# Patient Record
Sex: Female | Born: 1960 | Race: Black or African American | Hispanic: No | Smoking: Current every day smoker
Health system: Southern US, Community
[De-identification: ages and names within clinical notes are randomized; demographics above are authoritative.]

## PROBLEM LIST (undated history)

## (undated) DIAGNOSIS — E119 Type 2 diabetes mellitus without complications: Secondary | ICD-10-CM

## (undated) DIAGNOSIS — T50901A Poisoning by unspecified drugs, medicaments and biological substances, accidental (unintentional), initial encounter: Secondary | ICD-10-CM

## (undated) DIAGNOSIS — R0602 Shortness of breath: Secondary | ICD-10-CM

## (undated) DIAGNOSIS — R569 Unspecified convulsions: Secondary | ICD-10-CM

## (undated) DIAGNOSIS — F4310 Post-traumatic stress disorder, unspecified: Secondary | ICD-10-CM

## (undated) DIAGNOSIS — T8859XA Other complications of anesthesia, initial encounter: Secondary | ICD-10-CM

## (undated) DIAGNOSIS — T4145XA Adverse effect of unspecified anesthetic, initial encounter: Secondary | ICD-10-CM

## (undated) DIAGNOSIS — F32A Depression, unspecified: Secondary | ICD-10-CM

## (undated) DIAGNOSIS — J45909 Unspecified asthma, uncomplicated: Secondary | ICD-10-CM

## (undated) DIAGNOSIS — N189 Chronic kidney disease, unspecified: Secondary | ICD-10-CM

## (undated) DIAGNOSIS — F329 Major depressive disorder, single episode, unspecified: Secondary | ICD-10-CM

## (undated) DIAGNOSIS — K219 Gastro-esophageal reflux disease without esophagitis: Secondary | ICD-10-CM

## (undated) DIAGNOSIS — Z9289 Personal history of other medical treatment: Secondary | ICD-10-CM

## (undated) DIAGNOSIS — D631 Anemia in chronic kidney disease: Secondary | ICD-10-CM

## (undated) DIAGNOSIS — F99 Mental disorder, not otherwise specified: Secondary | ICD-10-CM

## (undated) DIAGNOSIS — F17200 Nicotine dependence, unspecified, uncomplicated: Secondary | ICD-10-CM

## (undated) DIAGNOSIS — R011 Cardiac murmur, unspecified: Secondary | ICD-10-CM

## (undated) DIAGNOSIS — J449 Chronic obstructive pulmonary disease, unspecified: Secondary | ICD-10-CM

## (undated) DIAGNOSIS — F319 Bipolar disorder, unspecified: Secondary | ICD-10-CM

## (undated) DIAGNOSIS — I10 Essential (primary) hypertension: Secondary | ICD-10-CM

## (undated) HISTORY — PX: OTHER SURGICAL HISTORY: SHX169

## (undated) HISTORY — PX: PARATHYROIDECTOMY: SHX19

## (undated) HISTORY — PX: JOINT REPLACEMENT: SHX530

---

## 2011-05-20 DIAGNOSIS — R87619 Unspecified abnormal cytological findings in specimens from cervix uteri: Secondary | ICD-10-CM | POA: Diagnosis not present

## 2011-05-20 DIAGNOSIS — Z113 Encounter for screening for infections with a predominantly sexual mode of transmission: Secondary | ICD-10-CM | POA: Diagnosis not present

## 2011-05-20 DIAGNOSIS — Z0000 Encounter for general adult medical examination without abnormal findings: Secondary | ICD-10-CM | POA: Diagnosis not present

## 2011-05-20 DIAGNOSIS — R87811 Vaginal high risk human papillomavirus (HPV) DNA test positive: Secondary | ICD-10-CM | POA: Diagnosis not present

## 2011-05-20 DIAGNOSIS — Z01419 Encounter for gynecological examination (general) (routine) without abnormal findings: Secondary | ICD-10-CM | POA: Diagnosis not present

## 2011-05-20 DIAGNOSIS — N949 Unspecified condition associated with female genital organs and menstrual cycle: Secondary | ICD-10-CM | POA: Diagnosis not present

## 2011-05-25 DIAGNOSIS — F17200 Nicotine dependence, unspecified, uncomplicated: Secondary | ICD-10-CM | POA: Diagnosis not present

## 2011-05-25 DIAGNOSIS — R51 Headache: Secondary | ICD-10-CM | POA: Diagnosis not present

## 2011-05-25 DIAGNOSIS — J3489 Other specified disorders of nose and nasal sinuses: Secondary | ICD-10-CM | POA: Diagnosis not present

## 2011-05-25 DIAGNOSIS — I10 Essential (primary) hypertension: Secondary | ICD-10-CM | POA: Diagnosis not present

## 2011-07-05 ENCOUNTER — Ambulatory Visit: Payer: Medicare Other

## 2011-07-27 DIAGNOSIS — I10 Essential (primary) hypertension: Secondary | ICD-10-CM | POA: Diagnosis not present

## 2011-07-27 DIAGNOSIS — E119 Type 2 diabetes mellitus without complications: Secondary | ICD-10-CM | POA: Diagnosis not present

## 2011-08-17 DIAGNOSIS — I10 Essential (primary) hypertension: Secondary | ICD-10-CM | POA: Diagnosis not present

## 2011-08-17 DIAGNOSIS — E785 Hyperlipidemia, unspecified: Secondary | ICD-10-CM | POA: Diagnosis not present

## 2011-08-17 DIAGNOSIS — F17200 Nicotine dependence, unspecified, uncomplicated: Secondary | ICD-10-CM | POA: Diagnosis not present

## 2011-08-29 ENCOUNTER — Encounter (HOSPITAL_COMMUNITY): Payer: Self-pay | Admitting: Emergency Medicine

## 2011-08-29 ENCOUNTER — Emergency Department (HOSPITAL_COMMUNITY)
Admission: EM | Admit: 2011-08-29 | Discharge: 2011-08-29 | Disposition: A | Discharge status: Home / Self Care | Payer: Medicare Other | Attending: Emergency Medicine | Admitting: Emergency Medicine

## 2011-08-29 DIAGNOSIS — F319 Bipolar disorder, unspecified: Secondary | ICD-10-CM | POA: Diagnosis not present

## 2011-08-29 DIAGNOSIS — N289 Disorder of kidney and ureter, unspecified: Secondary | ICD-10-CM | POA: Diagnosis not present

## 2011-08-29 DIAGNOSIS — Z79899 Other long term (current) drug therapy: Secondary | ICD-10-CM | POA: Diagnosis not present

## 2011-08-29 DIAGNOSIS — R4182 Altered mental status, unspecified: Secondary | ICD-10-CM | POA: Insufficient documentation

## 2011-08-29 DIAGNOSIS — F209 Schizophrenia, unspecified: Secondary | ICD-10-CM | POA: Diagnosis not present

## 2011-08-29 DIAGNOSIS — R45851 Suicidal ideations: Secondary | ICD-10-CM | POA: Diagnosis not present

## 2011-08-29 HISTORY — DX: Mental disorder, not otherwise specified: F99

## 2011-08-29 LAB — URINALYSIS, ROUTINE W REFLEX MICROSCOPIC
Bilirubin Urine: NEGATIVE
Glucose, UA: NEGATIVE mg/dL
Hgb urine dipstick: NEGATIVE
Ketones, ur: NEGATIVE mg/dL
Leukocytes, UA: NEGATIVE
Nitrite: NEGATIVE
Protein, ur: NEGATIVE mg/dL
Specific Gravity, Urine: 1.006 (ref 1.005–1.030)
Urobilinogen, UA: 0.2 mg/dL (ref 0.0–1.0)
pH: 6.5 (ref 5.0–8.0)

## 2011-08-29 LAB — COMPREHENSIVE METABOLIC PANEL
ALT: 15 U/L (ref 0–35)
AST: 19 U/L (ref 0–37)
Albumin: 3.8 g/dL (ref 3.5–5.2)
Alkaline Phosphatase: 72 U/L (ref 39–117)
BUN: 22 mg/dL (ref 6–23)
CO2: 25 mEq/L (ref 19–32)
Calcium: 11.4 mg/dL — ABNORMAL HIGH (ref 8.4–10.5)
Chloride: 103 mEq/L (ref 96–112)
Creatinine, Ser: 2.22 mg/dL — ABNORMAL HIGH (ref 0.50–1.10)
GFR calc Af Amer: 28 mL/min — ABNORMAL LOW (ref >90)
GFR calc non Af Amer: 24 mL/min — ABNORMAL LOW (ref >90)
Glucose, Bld: 73 mg/dL (ref 70–99)
Potassium: 5 mEq/L (ref 3.5–5.1)
Sodium: 138 mEq/L (ref 135–145)
Total Bilirubin: 0.2 mg/dL — ABNORMAL LOW (ref 0.3–1.2)
Total Protein: 8.5 g/dL — ABNORMAL HIGH (ref 6.0–8.3)

## 2011-08-29 LAB — RAPID URINE DRUG SCREEN, HOSP PERFORMED
Amphetamines: NOT DETECTED
Barbiturates: NOT DETECTED
Benzodiazepines: NOT DETECTED
Cocaine: NOT DETECTED
Opiates: NOT DETECTED
Tetrahydrocannabinol: NOT DETECTED

## 2011-08-29 LAB — CBC
HCT: 36.1 % (ref 36.0–46.0)
Hemoglobin: 11.6 g/dL — ABNORMAL LOW (ref 12.0–15.0)
MCH: 31.2 pg (ref 26.0–34.0)
MCHC: 32.1 g/dL (ref 30.0–36.0)
MCV: 97 fL (ref 78.0–100.0)
Platelets: 270 10*3/uL (ref 150–400)
RBC: 3.72 MIL/uL — ABNORMAL LOW (ref 3.87–5.11)
RDW: 15.4 % (ref 11.5–15.5)
WBC: 9 10*3/uL (ref 4.0–10.5)

## 2011-08-29 LAB — VALPROIC ACID LEVEL: Valproic Acid Lvl: <10 ug/mL — ABNORMAL LOW (ref 50.0–100.0)

## 2011-08-29 LAB — ETHANOL: Alcohol, Ethyl (B): <11 mg/dL (ref 0–11)

## 2011-08-29 LAB — ACETAMINOPHEN LEVEL: Acetaminophen (Tylenol), Serum: <15 ug/mL (ref 10–30)

## 2011-08-29 MED ORDER — OLANZAPINE 10 MG PO TBDP: 10.0000 mg | ORAL_TABLET | Freq: Every day | ORAL | Status: DC

## 2011-08-29 MED ORDER — OLANZAPINE 10 MG PO TBDP
10.0000 mg | ORAL_TABLET | Freq: Once | ORAL | Status: AC
  Administered 2011-08-29: 10 mg via ORAL
  Filled 2011-08-29: qty 1

## 2011-08-29 MED ORDER — LORAZEPAM 1 MG PO TABS
1.0000 mg | ORAL_TABLET | Freq: Once | ORAL | Status: AC
  Administered 2011-08-29: 1 mg via ORAL
  Filled 2011-08-29: qty 1

## 2011-08-29 NOTE — ED Notes (Signed)
[employer] at bedside 

## 2011-08-29 NOTE — ED Notes (Signed)
To ed via [facility] with c/o altered mental status--- [employer] with patient. Not in custody. Patient wanting to leave,

## 2011-08-29 NOTE — ED Notes (Signed)
[id number]> Expected date:<BR> Expected time:<BR> Means of arrival:<BR> Comments:<BR> Ems/ off bipolar meds

## 2011-08-29 NOTE — Discharge Instructions (Signed)
Please followup for behavioral health as instructed by team. Jennifer Lawson been given referral to the nephrologist to be reassessed for your kidney disease.
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
## 2011-08-29 NOTE — ED Notes (Signed)
Family at bedside. 
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
## 2011-08-29 NOTE — ED Notes (Signed)
Pt with constant chewing motions during assessment. Pt refuses to answer questions when asked stating" i want to get out of here".

## 2011-08-30 ENCOUNTER — Encounter (HOSPITAL_COMMUNITY): Payer: Self-pay

## 2011-08-30 ENCOUNTER — Emergency Department (HOSPITAL_COMMUNITY)
Admission: EM | Admit: 2011-08-30 | Discharge: 2011-08-31 | Disposition: A | Discharge status: Home / Self Care | Payer: Medicare Other | Attending: Emergency Medicine | Admitting: Emergency Medicine

## 2011-08-30 DIAGNOSIS — R45851 Suicidal ideations: Secondary | ICD-10-CM | POA: Insufficient documentation

## 2011-08-30 DIAGNOSIS — F209 Schizophrenia, unspecified: Secondary | ICD-10-CM | POA: Diagnosis not present

## 2011-08-30 DIAGNOSIS — Z79899 Other long term (current) drug therapy: Secondary | ICD-10-CM | POA: Diagnosis not present

## 2011-08-30 HISTORY — DX: Depression, unspecified: F32.A

## 2011-08-30 HISTORY — DX: Major depressive disorder, single episode, unspecified: F32.9

## 2011-08-30 LAB — COMPREHENSIVE METABOLIC PANEL
ALT: 15 U/L (ref 0–35)
AST: 18 U/L (ref 0–37)
Albumin: 3.6 g/dL (ref 3.5–5.2)
Alkaline Phosphatase: 70 U/L (ref 39–117)
BUN: 32 mg/dL — ABNORMAL HIGH (ref 6–23)
CO2: 25 mEq/L (ref 19–32)
Calcium: 10.4 mg/dL (ref 8.4–10.5)
Chloride: 100 mEq/L (ref 96–112)
Creatinine, Ser: 2.61 mg/dL — ABNORMAL HIGH (ref 0.50–1.10)
GFR calc Af Amer: 23 mL/min — ABNORMAL LOW (ref >90)
GFR calc non Af Amer: 20 mL/min — ABNORMAL LOW (ref >90)
Glucose, Bld: 84 mg/dL (ref 70–99)
Potassium: 4.7 mEq/L (ref 3.5–5.1)
Sodium: 135 mEq/L (ref 135–145)
Total Bilirubin: 0.1 mg/dL — ABNORMAL LOW (ref 0.3–1.2)
Total Protein: 8.1 g/dL (ref 6.0–8.3)

## 2011-08-30 LAB — CBC
HCT: 35.1 % — ABNORMAL LOW (ref 36.0–46.0)
Hemoglobin: 11.3 g/dL — ABNORMAL LOW (ref 12.0–15.0)
MCH: 31.3 pg (ref 26.0–34.0)
MCHC: 32.2 g/dL (ref 30.0–36.0)
MCV: 97.2 fL (ref 78.0–100.0)
Platelets: 234 10*3/uL (ref 150–400)
RBC: 3.61 MIL/uL — ABNORMAL LOW (ref 3.87–5.11)
RDW: 15.3 % (ref 11.5–15.5)
WBC: 8.5 10*3/uL (ref 4.0–10.5)

## 2011-08-30 LAB — ETHANOL: Alcohol, Ethyl (B): <11 mg/dL (ref 0–11)

## 2011-08-30 MED ORDER — NICOTINE 21 MG/24HR TD PT24
21.0000 mg | MEDICATED_PATCH | Freq: Every day | TRANSDERMAL | Status: DC
  Administered 2011-08-30 – 2011-08-31 (×2): 21 mg via TRANSDERMAL
  Filled 2011-08-30 (×2): qty 1

## 2011-08-30 MED ORDER — METOPROLOL SUCCINATE ER 50 MG PO TB24
50.0000 mg | ORAL_TABLET | Freq: Every day | ORAL | Status: DC
  Administered 2011-08-31: 50 mg via ORAL
  Filled 2011-08-30: qty 1

## 2011-08-30 MED ORDER — DIVALPROEX SODIUM 250 MG PO DR TAB
250.0000 mg | DELAYED_RELEASE_TABLET | Freq: Every day | ORAL | Status: DC
  Administered 2011-08-31: 250 mg via ORAL
  Filled 2011-08-30: qty 1

## 2011-08-30 MED ORDER — ACETAMINOPHEN 325 MG PO TABS: 650.0000 mg | ORAL_TABLET | ORAL | Status: DC | PRN

## 2011-08-30 MED ORDER — QUETIAPINE FUMARATE 100 MG PO TABS: 100.0000 mg | ORAL_TABLET | Freq: Every day | ORAL | Status: DC

## 2011-08-30 MED ORDER — FUROSEMIDE 20 MG PO TABS
20.0000 mg | ORAL_TABLET | Freq: Two times a day (BID) | ORAL | Status: DC
  Administered 2011-08-31 (×2): 20 mg via ORAL
  Filled 2011-08-30 (×3): qty 1

## 2011-08-30 MED ORDER — ALUM & MAG HYDROXIDE-SIMETH 200-200-20 MG/5ML PO SUSP: 30.0000 mL | ORAL | Status: DC | PRN

## 2011-08-30 MED ORDER — ONDANSETRON HCL 4 MG PO TABS: 4.0000 mg | ORAL_TABLET | Freq: Three times a day (TID) | ORAL | Status: DC | PRN

## 2011-08-30 MED ORDER — IBUPROFEN 600 MG PO TABS: 600.0000 mg | ORAL_TABLET | Freq: Three times a day (TID) | ORAL | Status: DC | PRN

## 2011-08-30 MED ORDER — ZOLPIDEM TARTRATE 5 MG PO TABS
5.0000 mg | ORAL_TABLET | Freq: Every evening | ORAL | Status: DC | PRN
  Administered 2011-08-30: 5 mg via ORAL
  Filled 2011-08-30: qty 1

## 2011-08-30 MED ORDER — AMLODIPINE BESYLATE 10 MG PO TABS
10.0000 mg | ORAL_TABLET | Freq: Every day | ORAL | Status: DC
  Administered 2011-08-31: 10 mg via ORAL
  Filled 2011-08-30: qty 1

## 2011-08-30 NOTE — ED Provider Notes (Signed)
Medical screening examination/treatment/procedure(s) were performed by non-physician practitioner and as supervising physician I was immediately available for consultation/collaboration.   Godwin Tedesco, MD 08/30/11 2323 

## 2011-08-30 NOTE — ED Notes (Signed)
Patient ate regular diet and drank soft drink with out difficulty

## 2011-08-30 NOTE — ED Notes (Signed)
Pt in with family member with s/i states per family pt is expressing thoughts of harming herself, with paranoid activity pt admits to s/i without a plan

## 2011-08-30 NOTE — ED Provider Notes (Signed)
History     CSN: [id number]  Arrival date & time 08/30/11  1714   First MD Initiated Contact with Patient 08/30/11 1718      Chief Complaint  Patient presents with  . V70.1    suicidal     (Consider location/radiation/quality/duration/timing/severity/associated sxs/prior treatment) HPI  Pt brought in by her daughter for SI. The patient states that she wants to die. She was here yesterday and had her IVC papers rescinded and was given information for outpatient treatment. The daughter has a baby < 34 years old at home and the pt keeps asking to hold the baby but the daughter says she gets very aggressive and smoked two packs of cigarettes back to back. The daughter is worried about her son and her safety. She states that she can not live with her mother until she gets therapeutic on her medications. The patients affect is very odd. The only words she will say to me is that "I want to die. I ain't got nobody".  Past Medical History  Diagnosis Date  . Mental disorder     History reviewed. No pertinent past surgical history.  History reviewed. No pertinent family history.  History  Substance Use Topics  . Smoking status: Current Everyday Smoker  . Smokeless tobacco: Not on file  . Alcohol Use: Yes    OB History    Grav Para Term Preterm Abortions TAB SAB Ect Mult Living                  Review of Systems  Unable to perform ROS   Allergies  Codeine sulfate  Home Medications   Current Outpatient Rx  Name Route Sig Dispense Refill  . AMLODIPINE BESYLATE 10 MG PO TABS Oral Take 10 mg by mouth daily.    Marland Kitchen DIVALPROEX SODIUM 250 MG PO TBEC Oral Take 250 mg by mouth daily.    Marland Kitchen ESZOPICLONE 1 MG PO TABS Oral Take 3 mg by mouth at bedtime. Take immediately before bedtime    . FUROSEMIDE 20 MG PO TABS Oral Take 20 mg by mouth 2 (two) times daily.    Marland Kitchen METOPROLOL SUCCINATE ER 50 MG PO TB24 Oral Take 50 mg by mouth daily. Take with or immediately following a meal.    .  QUETIAPINE FUMARATE 100 MG PO TABS Oral Take 100 mg by mouth at bedtime.      BP 120/84  Pulse 88  Temp(Src) 98.5 F (36.9 C) (Oral)  Resp 16  SpO2 100%  Physical Exam  Constitutional: She appears well-developed and well-nourished.       Pt disheveled and does not answer questions appropriately   HENT:  Head: Normocephalic and atraumatic.  Eyes: Pupils are equal, round, and reactive to light.  Pulmonary/Chest: Effort normal.  Abdominal: Soft.  Neurological: She is alert.  Skin: Skin is warm and dry.    ED Course  Procedures (including critical care time)   Labs Reviewed  CBC  COMPREHENSIVE METABOLIC PANEL  ETHANOL  URINE RAPID DRUG SCREEN (HOSP PERFORMED)   No results found.   1. Suicidal ideations       MDM  ACT to consult on patient        Dorthula Matas, [place] 08/30/11 2313

## 2011-08-31 DIAGNOSIS — R45851 Suicidal ideations: Secondary | ICD-10-CM | POA: Diagnosis not present

## 2011-08-31 DIAGNOSIS — F209 Schizophrenia, unspecified: Secondary | ICD-10-CM | POA: Diagnosis not present

## 2011-08-31 MED ORDER — ZOLPIDEM TARTRATE 5 MG PO TABS: 5.0000 mg | ORAL_TABLET | Freq: Every evening | ORAL | Status: DC | PRN

## 2011-08-31 MED ORDER — LORAZEPAM 2 MG/ML IJ SOLN
2.0000 mg | Freq: Once | INTRAMUSCULAR | Status: AC
  Administered 2011-08-31: 2 mg via INTRAMUSCULAR
  Filled 2011-08-31: qty 1

## 2011-08-31 MED ORDER — LORAZEPAM 2 MG/ML IJ SOLN
INTRAMUSCULAR | Status: AC
  Filled 2011-08-31: qty 1

## 2011-08-31 MED ORDER — LORAZEPAM 1 MG PO TABS
ORAL_TABLET | ORAL | Status: AC
  Filled 2011-08-31: qty 2

## 2011-08-31 NOTE — ED Provider Notes (Signed)
History     CSN: [id number]  Arrival date & time 08/30/11  1714   First MD Initiated Contact with Patient 08/30/11 1718      Chief Complaint  Patient presents with  . V70.1    suicidal     (Consider location/radiation/quality/duration/timing/severity/associated sxs/prior treatment) HPI...  complains of depression verbal days.  she has had suicidal ideation but not now. Has history of schizophrenia. Nothing makes symptoms better or worse. Described as moderate  Past Medical History  Diagnosis Date  . Mental disorder   . Depression     History reviewed. No pertinent past surgical history.  History reviewed. No pertinent family history.  History  Substance Use Topics  . Smoking status: Current Everyday Smoker  . Smokeless tobacco: Not on file  . Alcohol Use: Yes    OB History    Grav Para Term Preterm Abortions TAB SAB Ect Mult Living                  Review of Systems  All other systems reviewed and are negative.    Allergies  Codeine sulfate  Home Medications   Current Outpatient Rx  Name Route Sig Dispense Refill  . AMLODIPINE BESYLATE 10 MG PO TABS Oral Take 10 mg by mouth daily.    Marland Kitchen DIVALPROEX SODIUM 250 MG PO TBEC Oral Take 250 mg by mouth daily.    Marland Kitchen ESZOPICLONE 1 MG PO TABS Oral Take 3 mg by mouth at bedtime. Take immediately before bedtime    . FUROSEMIDE 20 MG PO TABS Oral Take 20 mg by mouth 2 (two) times daily.    Marland Kitchen METOPROLOL SUCCINATE ER 50 MG PO TB24 Oral Take 50 mg by mouth daily. Take with or immediately following a meal.    . QUETIAPINE FUMARATE 100 MG PO TABS Oral Take 100 mg by mouth at bedtime.      BP 123/80  Pulse 91  Temp(Src) 98.3 F (36.8 C) (Oral)  Resp 18  SpO2 99%  Physical Exam  Nursing note and vitals reviewed. Constitutional: She is oriented to person, place, and time. She appears well-developed and well-nourished.  HENT:  Head: Normocephalic and atraumatic.  Eyes: Conjunctivae and EOM are normal. Pupils are equal,  round, and reactive to light.  Neck: Normal range of motion. Neck supple.  Cardiovascular: Normal rate and regular rhythm.   Pulmonary/Chest: Effort normal and breath sounds normal.  Abdominal: Soft. Bowel sounds are normal.  Musculoskeletal: Normal range of motion.  Neurological: She is alert and oriented to person, place, and time.  Skin: Skin is warm and dry.  Psychiatric:       Flat affect depressed    ED Course  Procedures (including critical care time)  Labs Reviewed  CBC - Abnormal; Notable for the following:    RBC 3.61 (*)    Hemoglobin 11.3 (*)    HCT 35.1 (*)    All other components within normal limits  COMPREHENSIVE METABOLIC PANEL - Abnormal; Notable for the following:    BUN 32 (*)    Creatinine, Ser 2.61 (*)    Total Bilirubin 0.1 (*)    GFR calc non Af Amer 20 (*)    GFR calc Af Amer 23 (*)    All other components within normal limits  ETHANOL  URINE RAPID DRUG SCREEN (HOSP PERFORMED)   No results found.   1. Schizophrenia       MDM  Tele psychiatry consult obtained. No  suicidal ideation at this time.  We'll discharge        Donnetta Hutching, MD 09/22/11 2159

## 2011-08-31 NOTE — Discharge Instructions (Signed)
Followup with community resources as recommendedSchizophrenia Schizophrenia is a serious mental illness. There is disturbed and disorganized thinking, language, and behavior. Patients may see, hear, or feel things that are not really there. Sometimes speech is incoherent and there are multiple problems with day to day living. Schizophrenia should not be confused with multiple personality disorder (now called dissociative identity disorder) in which a person has at least two distinct personalities. About 1% of people have schizophrenia in their lifetimes. It affects men and women equally. CAUSES  There are many theories about the cause of schizophrenia. The genes a person inherits from his or her parents may be partially responsible. Stress in a person's environment can trigger episodes. A person may have functioned normally for years and then have an acute (sudden) psychotic episode caused by stress. Some scientists believe that something might happen before birth, such as a viral infection in the womb, that causes schizophrenic symptoms (problems) decades later. Special scans, such as PET (positron-emission tomography) have been used to look at the brains of people with this illness. These pictures show that some parts of the brain seem to have metabolic or chemical abnormalities. Lab studies have shown that nerve cells in some parts of the brains of schizophrenics may be uneven or damaged. Another possible cause is that chemicals carrying signals between nerve cells may not be working. Schizophrenia does not appear to be caused by family problems. Stress does appear to make things worse for people with this illness. SYMPTOMS No single symptom defines this condition. Important signs are:  Hallucinations (hearing voices or seeing things that are not there to hear or see).   Dressing inappropriately.   Neglecting personal hygiene and grooming.   Withdrawing from social contacts and not speaking to  anybody (autism).   Inability to understand what a schizophrenic is saying.   A growing distrust of people without good cause.   Being very bland or blunted emotionally (flat affect).  TYPES OF SCHIZOPHRENIA  Paranoid schizophrenia involves delusional thoughts. The patient believes people around them are against them and plotting against them.   In grandiose schizophrenia the patient may feel that he is God, or the President, etc.   Disorganized schizophrenia involves symptoms of disorganized speech.  TREATMENT  Medications are the most important part of the treatment of schizophrenia. Many medications are available that can relieve symptoms. It is often helpful if these can be administered by a more trusted family member because the patient may sometimes think they are being poisoned. It is important that the medications be given regularly even when the patient seems to be doing well. Do not stop giving medications without instruction by a caregiver. This could lead to a relapse. Hospitalization may sometimes be necessary if symptoms cannot be controlled with medications. Schizophrenia may be lifelong. However, periods of illness may be inter spaced with long periods of normality. Medications can greatly improve the quality of life. Non prescribed drugs and alcohol should be avoided.  Assistance is available for care. The [employer] is an organization of family members of people with severe mental illness. They direct families and patients to support groups, education, and advocacy programs for additional help. [employer] [facility] [employer] [employer]) toll-free help line number is 800/950-NAMI, or (779)455-6634. Document Released: 04/22/2000 Document Revised: 04/14/2011 Document Reviewed: 04/25/2005 [facility] Patient Information 2012 [place], [place].

## 2011-08-31 NOTE — BH Assessment (Signed)
Assessment Note   Jennifer Lawson is a 51 y.o. female who returns to [facility] with c/o SI, no plan.  Pt was discharged on 08/29/11 from ed after IVC recinded by er physician.  Pt recently moved from [place] to [place] due to inadequate mental health in [place] in hopes of receiving better care in [place].  Pt was d/c'd with referrals for therapists and psychiatrists.  Pt says she came back to ed due to having SI because her children made her feel bad today about smoking and telling that she doesn't do anything right.  Pt no longer endorses SI and wants to return home.  Telepsych will be requested to determine final disposition.    Axis I: Bipolar, Depressed Axis II: Deferred Axis III:  Past Medical History  Diagnosis Date  . Mental disorder   . Depression    Axis IV: other psychosocial or environmental problems and problems with primary support group Axis V: 41-50 serious symptoms  Past Medical History:  Past Medical History  Diagnosis Date  . Mental disorder   . Depression     History reviewed. No pertinent past surgical history.  Family History: History reviewed. No pertinent family history.  Social History:  reports that she has been smoking.  She does not have any smokeless tobacco history on file. She reports that she drinks alcohol. She reports that she does not use illicit drugs.  Additional Social History:  Alcohol / Drug Use Pain Medications: None  Prescriptions: None  Over the Counter: None  History of alcohol / drug use?: No history of alcohol / drug abuse Longest period of sobriety (when/how long): nONE  Allergies:  Allergies  Allergen Reactions  . Codeine Sulfate Anaphylaxis    Daughter called about having this allergy     Home Medications:  (Not in a hospital admission)  OB/GYN Status:  No LMP recorded. Patient is not currently having periods (Reason: Other).  General Assessment Data Location of Assessment: [facility] Living Arrangements: Other  (Comment) (Lives w/daughter, son-in-law and grandson ) Can pt return to current living arrangement?: Yes Admission Status: Voluntary Is patient capable of signing voluntary admission?: Yes Transfer from: Acute Hospital Referral Source: MD  Education Status Is patient currently in school?: No Current Grade: None  Highest grade of school patient has completed: None  Name of school: None  Contact person: None   Risk to self Suicidal Ideation: No-Not Currently/Within Last 6 Months Suicidal Intent: No-Not Currently/Within Last 6 Months Is patient at risk for suicide?: No Suicidal Plan?: No-Not Currently/Within Last 6 Months Access to Means: No What has been your use of drugs/alcohol within the last 12 months?: Pt denies  Previous Attempts/Gestures: No How many times?: 0  Other Self Harm Risks: None  Triggers for Past Attempts: None known Intentional Self Injurious Behavior: None Family Suicide History: No Recent stressful life event(s): Other (Comment) (Off Medications x1 wk and relational issues w/family ) Persecutory voices/beliefs?: No Depression: Yes Depression Symptoms: Loss of interest in usual pleasures Substance abuse history and/or treatment for substance abuse?: No Suicide prevention information given to non-admitted patients: Not applicable  Risk to Others Homicidal Ideation: No Thoughts of Harm to Others: No Current Homicidal Intent: No Current Homicidal Plan: No Access to Homicidal Means: No Identified Victim: None  History of harm to others?: No Assessment of Violence: None Noted Violent Behavior Description: None  Does patient have access to weapons?: No Criminal Charges Pending?: No Does patient have a court date: No  Psychosis  Hallucinations: None noted Delusions: None noted  Mental Status Report Appear/Hygiene: Disheveled Eye Contact: Good Motor Activity: Unremarkable Speech: Logical/coherent;Slow;Soft Level of Consciousness: Alert Mood:  Depressed Affect: Sad Anxiety Level: None Thought Processes: Coherent;Relevant Judgement: Unimpaired Orientation: Person;Place;Time;Situation Obsessive Compulsive Thoughts/Behaviors: None  Cognitive Functioning Concentration: Normal Memory: Recent Intact;Remote Intact IQ: Average Insight: Fair Impulse Control: Fair Appetite: Good Weight Loss: 0  Weight Gain: 0  Total Hours of Sleep: 3  (Since being d/c'd last night ) Vegetative Symptoms: None  Prior Inpatient Therapy Prior Inpatient Therapy: Yes Prior Therapy Dates: 1995-2008 Prior Therapy Facilty/Provider(s): [place], [place], [place]  Reason for Treatment: Bipolar D/O   Prior Outpatient Therapy Prior Outpatient Therapy: Yes Prior Therapy Dates: Current  Prior Therapy Facilty/Provider(s): Dr. Mikeal Hawthorne  Reason for Treatment: Med Mgt   ADL Screening (condition at time of admission) Patient's cognitive ability adequate to safely complete daily activities?: Yes Patient able to express need for assistance with ADLs?: Yes Independently performs ADLs?: Yes Weakness of Legs: None Weakness of Arms/Hands: None       Abuse/Neglect Assessment (Assessment to be complete while patient is alone) Physical Abuse: Denies Verbal Abuse: Denies Sexual Abuse: Denies Exploitation of patient/patient's resources: Denies Self-Neglect: Denies Values / Beliefs Cultural Requests During Hospitalization: None Spiritual Requests During Hospitalization: None Consults Spiritual Care Consult Needed: No Social Work Consult Needed: No Merchant navy officer (For Healthcare) Advance Directive: Patient does not have advance directive;Patient would not like information Pre-existing out of facility DNR order (yellow form or pink MOST form): No    Additional Information 1:1 In Past 12 Months?: No CIRT Risk: No Elopement Risk: No Does patient have medical clearance?: Yes     Disposition:  Disposition Disposition of Patient: Referred to  (Telepsych for final disposition ) Patient referred to: Other (Comment) (Telepsych )  On Site Evaluation by:   Reviewed with Physician:     Murrell Redden 08/31/2011 12:15 AM

## 2011-08-31 NOTE — ED Notes (Signed)
Pt discharged home with daughter. Denies SI/HI. No A/VH. Instructions reviewed. Verbalizes understanding.

## 2011-08-31 NOTE — ED Notes (Signed)
Pt had telepsych completed. Narrative by Dr. Debbora Presto states: "Jennifer Lawson was brought in by her family because of vague suicidal ideations. Presently she can contract for safety and regrets the statements that led to being brought tot he hospital. Jennifer Lawson is no evidence of delusional thought process or cognitive impairments. She denies any plan or intent to harm self or others. Patient has a history of demonstrating regressed coping skills. At this time the patient is cognitively intact and may be discharged." Will discuss with EDP for disposition.

## 2011-08-31 NOTE — ED Notes (Signed)
Spoke with daughter on phone- daughter states that she is scared of her mother, that she wakes up and finds her  Mother standing over her.   Is unable to pick her up until after 4pm.

## 2011-08-31 NOTE — ED Provider Notes (Signed)
Pt being released to family.  Requested a sleeping pill which seemed to help here while she was here in the ED.  Rx for ambien given  Celene Kras, MD 08/31/11 [phone]
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
## 2011-09-04 DIAGNOSIS — R45851 Suicidal ideations: Secondary | ICD-10-CM | POA: Diagnosis not present

## 2011-09-04 DIAGNOSIS — F322 Major depressive disorder, single episode, severe without psychotic features: Secondary | ICD-10-CM | POA: Diagnosis not present

## 2011-09-04 DIAGNOSIS — F319 Bipolar disorder, unspecified: Secondary | ICD-10-CM | POA: Diagnosis not present

## 2011-09-04 DIAGNOSIS — F489 Nonpsychotic mental disorder, unspecified: Secondary | ICD-10-CM | POA: Diagnosis not present

## 2011-09-05 DIAGNOSIS — I440 Atrioventricular block, first degree: Secondary | ICD-10-CM | POA: Diagnosis not present

## 2011-09-05 DIAGNOSIS — R6889 Other general symptoms and signs: Secondary | ICD-10-CM | POA: Diagnosis not present

## 2011-09-05 DIAGNOSIS — R45851 Suicidal ideations: Secondary | ICD-10-CM | POA: Diagnosis not present

## 2011-09-05 DIAGNOSIS — R609 Edema, unspecified: Secondary | ICD-10-CM | POA: Diagnosis not present

## 2011-09-05 DIAGNOSIS — IMO0002 Reserved for concepts with insufficient information to code with codable children: Secondary | ICD-10-CM | POA: Diagnosis not present

## 2011-09-05 DIAGNOSIS — Z5900 Homelessness unspecified: Secondary | ICD-10-CM | POA: Diagnosis not present

## 2011-09-05 DIAGNOSIS — N289 Disorder of kidney and ureter, unspecified: Secondary | ICD-10-CM | POA: Diagnosis present

## 2011-09-05 DIAGNOSIS — Z9119 Patient's noncompliance with other medical treatment and regimen: Secondary | ICD-10-CM | POA: Diagnosis not present

## 2011-09-05 DIAGNOSIS — N184 Chronic kidney disease, stage 4 (severe): Secondary | ICD-10-CM | POA: Diagnosis not present

## 2011-09-05 DIAGNOSIS — T1500XA Foreign body in cornea, unspecified eye, initial encounter: Secondary | ICD-10-CM | POA: Diagnosis not present

## 2011-09-05 DIAGNOSIS — F17200 Nicotine dependence, unspecified, uncomplicated: Secondary | ICD-10-CM | POA: Diagnosis present

## 2011-09-05 DIAGNOSIS — I129 Hypertensive chronic kidney disease with stage 1 through stage 4 chronic kidney disease, or unspecified chronic kidney disease: Secondary | ICD-10-CM | POA: Diagnosis present

## 2011-09-05 DIAGNOSIS — Z5989 Other problems related to housing and economic circumstances: Secondary | ICD-10-CM | POA: Diagnosis not present

## 2011-09-05 DIAGNOSIS — Z598 Other problems related to housing and economic circumstances: Secondary | ICD-10-CM | POA: Diagnosis not present

## 2011-09-05 DIAGNOSIS — F319 Bipolar disorder, unspecified: Secondary | ICD-10-CM | POA: Diagnosis not present

## 2011-09-05 DIAGNOSIS — I10 Essential (primary) hypertension: Secondary | ICD-10-CM | POA: Diagnosis not present

## 2011-09-05 DIAGNOSIS — N183 Chronic kidney disease, stage 3 unspecified: Secondary | ICD-10-CM | POA: Diagnosis not present

## 2011-09-05 DIAGNOSIS — F322 Major depressive disorder, single episode, severe without psychotic features: Secondary | ICD-10-CM | POA: Diagnosis not present

## 2011-09-05 DIAGNOSIS — F259 Schizoaffective disorder, unspecified: Secondary | ICD-10-CM | POA: Diagnosis not present

## 2011-10-04 DIAGNOSIS — E785 Hyperlipidemia, unspecified: Secondary | ICD-10-CM | POA: Diagnosis not present

## 2011-10-04 DIAGNOSIS — N19 Unspecified kidney failure: Secondary | ICD-10-CM | POA: Diagnosis not present

## 2011-10-04 DIAGNOSIS — K219 Gastro-esophageal reflux disease without esophagitis: Secondary | ICD-10-CM | POA: Diagnosis not present

## 2011-10-04 DIAGNOSIS — F17200 Nicotine dependence, unspecified, uncomplicated: Secondary | ICD-10-CM | POA: Diagnosis not present

## 2011-10-04 DIAGNOSIS — R0609 Other forms of dyspnea: Secondary | ICD-10-CM | POA: Diagnosis not present

## 2011-10-04 DIAGNOSIS — R5383 Other fatigue: Secondary | ICD-10-CM | POA: Diagnosis not present

## 2011-10-04 DIAGNOSIS — R0989 Other specified symptoms and signs involving the circulatory and respiratory systems: Secondary | ICD-10-CM | POA: Diagnosis not present

## 2011-10-04 DIAGNOSIS — I10 Essential (primary) hypertension: Secondary | ICD-10-CM | POA: Diagnosis not present

## 2011-10-04 DIAGNOSIS — J40 Bronchitis, not specified as acute or chronic: Secondary | ICD-10-CM | POA: Diagnosis not present

## 2011-10-04 DIAGNOSIS — E538 Deficiency of other specified B group vitamins: Secondary | ICD-10-CM | POA: Diagnosis not present

## 2011-10-04 DIAGNOSIS — I509 Heart failure, unspecified: Secondary | ICD-10-CM | POA: Diagnosis not present

## 2011-10-04 DIAGNOSIS — F319 Bipolar disorder, unspecified: Secondary | ICD-10-CM | POA: Diagnosis not present

## 2011-10-04 DIAGNOSIS — E559 Vitamin D deficiency, unspecified: Secondary | ICD-10-CM | POA: Diagnosis not present

## 2011-10-04 DIAGNOSIS — R5381 Other malaise: Secondary | ICD-10-CM | POA: Diagnosis not present

## 2011-10-12 DIAGNOSIS — F259 Schizoaffective disorder, unspecified: Secondary | ICD-10-CM | POA: Diagnosis not present

## 2011-10-18 DIAGNOSIS — R0989 Other specified symptoms and signs involving the circulatory and respiratory systems: Secondary | ICD-10-CM | POA: Diagnosis not present

## 2011-10-18 DIAGNOSIS — I509 Heart failure, unspecified: Secondary | ICD-10-CM | POA: Diagnosis not present

## 2011-10-18 DIAGNOSIS — I10 Essential (primary) hypertension: Secondary | ICD-10-CM | POA: Diagnosis not present

## 2011-10-18 DIAGNOSIS — R0609 Other forms of dyspnea: Secondary | ICD-10-CM | POA: Diagnosis not present

## 2011-10-18 DIAGNOSIS — F17200 Nicotine dependence, unspecified, uncomplicated: Secondary | ICD-10-CM | POA: Diagnosis not present

## 2011-10-18 DIAGNOSIS — F319 Bipolar disorder, unspecified: Secondary | ICD-10-CM | POA: Diagnosis not present

## 2011-10-18 DIAGNOSIS — E785 Hyperlipidemia, unspecified: Secondary | ICD-10-CM | POA: Diagnosis not present

## 2011-10-18 DIAGNOSIS — N19 Unspecified kidney failure: Secondary | ICD-10-CM | POA: Diagnosis not present

## 2011-11-16 DIAGNOSIS — F4310 Post-traumatic stress disorder, unspecified: Secondary | ICD-10-CM | POA: Diagnosis not present

## 2011-11-16 DIAGNOSIS — F3132 Bipolar disorder, current episode depressed, moderate: Secondary | ICD-10-CM | POA: Diagnosis not present

## 2011-12-07 DIAGNOSIS — R0989 Other specified symptoms and signs involving the circulatory and respiratory systems: Secondary | ICD-10-CM | POA: Diagnosis not present

## 2011-12-07 DIAGNOSIS — I509 Heart failure, unspecified: Secondary | ICD-10-CM | POA: Diagnosis not present

## 2011-12-07 DIAGNOSIS — R0609 Other forms of dyspnea: Secondary | ICD-10-CM | POA: Diagnosis not present

## 2011-12-07 DIAGNOSIS — F17200 Nicotine dependence, unspecified, uncomplicated: Secondary | ICD-10-CM | POA: Diagnosis not present

## 2011-12-20 DIAGNOSIS — I10 Essential (primary) hypertension: Secondary | ICD-10-CM | POA: Diagnosis not present

## 2011-12-20 DIAGNOSIS — F17200 Nicotine dependence, unspecified, uncomplicated: Secondary | ICD-10-CM | POA: Diagnosis not present

## 2011-12-20 DIAGNOSIS — F319 Bipolar disorder, unspecified: Secondary | ICD-10-CM | POA: Diagnosis not present

## 2011-12-20 DIAGNOSIS — I509 Heart failure, unspecified: Secondary | ICD-10-CM | POA: Diagnosis not present

## 2011-12-20 DIAGNOSIS — N19 Unspecified kidney failure: Secondary | ICD-10-CM | POA: Diagnosis not present

## 2011-12-20 DIAGNOSIS — N183 Chronic kidney disease, stage 3 unspecified: Secondary | ICD-10-CM | POA: Diagnosis not present

## 2011-12-20 DIAGNOSIS — E785 Hyperlipidemia, unspecified: Secondary | ICD-10-CM | POA: Diagnosis not present

## 2011-12-20 DIAGNOSIS — R0609 Other forms of dyspnea: Secondary | ICD-10-CM | POA: Diagnosis not present

## 2011-12-20 DIAGNOSIS — R0989 Other specified symptoms and signs involving the circulatory and respiratory systems: Secondary | ICD-10-CM | POA: Diagnosis not present

## 2011-12-20 DIAGNOSIS — J40 Bronchitis, not specified as acute or chronic: Secondary | ICD-10-CM | POA: Diagnosis not present

## 2012-01-05 DIAGNOSIS — F3132 Bipolar disorder, current episode depressed, moderate: Secondary | ICD-10-CM | POA: Diagnosis not present

## 2012-01-05 DIAGNOSIS — F4310 Post-traumatic stress disorder, unspecified: Secondary | ICD-10-CM | POA: Diagnosis not present

## 2012-01-20 DIAGNOSIS — F3132 Bipolar disorder, current episode depressed, moderate: Secondary | ICD-10-CM | POA: Diagnosis not present

## 2012-01-20 DIAGNOSIS — F4310 Post-traumatic stress disorder, unspecified: Secondary | ICD-10-CM | POA: Diagnosis not present

## 2012-01-26 DIAGNOSIS — I509 Heart failure, unspecified: Secondary | ICD-10-CM | POA: Diagnosis not present

## 2012-01-26 DIAGNOSIS — F17200 Nicotine dependence, unspecified, uncomplicated: Secondary | ICD-10-CM | POA: Diagnosis not present

## 2012-01-26 DIAGNOSIS — Z23 Encounter for immunization: Secondary | ICD-10-CM | POA: Diagnosis not present

## 2012-01-26 DIAGNOSIS — I10 Essential (primary) hypertension: Secondary | ICD-10-CM | POA: Diagnosis not present

## 2012-01-26 DIAGNOSIS — F319 Bipolar disorder, unspecified: Secondary | ICD-10-CM | POA: Diagnosis not present

## 2012-01-26 DIAGNOSIS — N183 Chronic kidney disease, stage 3 unspecified: Secondary | ICD-10-CM | POA: Diagnosis not present

## 2012-01-26 DIAGNOSIS — E538 Deficiency of other specified B group vitamins: Secondary | ICD-10-CM | POA: Diagnosis not present

## 2012-01-26 DIAGNOSIS — E785 Hyperlipidemia, unspecified: Secondary | ICD-10-CM | POA: Diagnosis not present

## 2012-02-03 DIAGNOSIS — F4310 Post-traumatic stress disorder, unspecified: Secondary | ICD-10-CM | POA: Diagnosis not present

## 2012-02-03 DIAGNOSIS — F3132 Bipolar disorder, current episode depressed, moderate: Secondary | ICD-10-CM | POA: Diagnosis not present

## 2012-02-16 DIAGNOSIS — F3132 Bipolar disorder, current episode depressed, moderate: Secondary | ICD-10-CM | POA: Diagnosis not present

## 2012-02-16 DIAGNOSIS — F4310 Post-traumatic stress disorder, unspecified: Secondary | ICD-10-CM | POA: Diagnosis not present

## 2012-02-20 ENCOUNTER — Encounter (HOSPITAL_COMMUNITY): Payer: Self-pay | Admitting: Emergency Medicine

## 2012-02-20 ENCOUNTER — Emergency Department (HOSPITAL_COMMUNITY): Payer: Medicare Other

## 2012-02-20 ENCOUNTER — Inpatient Hospital Stay (HOSPITAL_COMMUNITY)
Admission: EM | Admit: 2012-02-20 | Discharge: 2012-02-23 | DRG: 918 | Disposition: A | Discharge status: Home / Self Care | Payer: Medicare Other | Attending: Family Medicine | Admitting: Family Medicine

## 2012-02-20 DIAGNOSIS — Y92009 Unspecified place in unspecified non-institutional (private) residence as the place of occurrence of the external cause: Secondary | ICD-10-CM

## 2012-02-20 DIAGNOSIS — T43614A Poisoning by caffeine, undetermined, initial encounter: Principal | ICD-10-CM | POA: Diagnosis present

## 2012-02-20 DIAGNOSIS — I10 Essential (primary) hypertension: Secondary | ICD-10-CM

## 2012-02-20 DIAGNOSIS — S060X9A Concussion with loss of consciousness of unspecified duration, initial encounter: Secondary | ICD-10-CM | POA: Diagnosis not present

## 2012-02-20 DIAGNOSIS — R7301 Impaired fasting glucose: Secondary | ICD-10-CM | POA: Diagnosis not present

## 2012-02-20 DIAGNOSIS — N179 Acute kidney failure, unspecified: Secondary | ICD-10-CM | POA: Diagnosis present

## 2012-02-20 DIAGNOSIS — T148XXA Other injury of unspecified body region, initial encounter: Secondary | ICD-10-CM | POA: Diagnosis not present

## 2012-02-20 DIAGNOSIS — N189 Chronic kidney disease, unspecified: Secondary | ICD-10-CM

## 2012-02-20 DIAGNOSIS — T43601A Poisoning by unspecified psychostimulants, accidental (unintentional), initial encounter: Secondary | ICD-10-CM | POA: Diagnosis present

## 2012-02-20 DIAGNOSIS — I129 Hypertensive chronic kidney disease with stage 1 through stage 4 chronic kidney disease, or unspecified chronic kidney disease: Secondary | ICD-10-CM | POA: Diagnosis present

## 2012-02-20 DIAGNOSIS — E860 Dehydration: Secondary | ICD-10-CM

## 2012-02-20 DIAGNOSIS — N289 Disorder of kidney and ureter, unspecified: Secondary | ICD-10-CM

## 2012-02-20 DIAGNOSIS — T50901A Poisoning by unspecified drugs, medicaments and biological substances, accidental (unintentional), initial encounter: Secondary | ICD-10-CM | POA: Diagnosis not present

## 2012-02-20 DIAGNOSIS — F17200 Nicotine dependence, unspecified, uncomplicated: Secondary | ICD-10-CM | POA: Diagnosis present

## 2012-02-20 DIAGNOSIS — F319 Bipolar disorder, unspecified: Secondary | ICD-10-CM

## 2012-02-20 DIAGNOSIS — R000 Tachycardia, unspecified: Secondary | ICD-10-CM | POA: Diagnosis present

## 2012-02-20 DIAGNOSIS — R404 Transient alteration of awareness: Secondary | ICD-10-CM | POA: Diagnosis present

## 2012-02-20 DIAGNOSIS — S0993XA Unspecified injury of face, initial encounter: Secondary | ICD-10-CM | POA: Diagnosis not present

## 2012-02-20 DIAGNOSIS — E873 Alkalosis: Secondary | ICD-10-CM | POA: Diagnosis present

## 2012-02-20 DIAGNOSIS — I498 Other specified cardiac arrhythmias: Secondary | ICD-10-CM | POA: Diagnosis not present

## 2012-02-20 DIAGNOSIS — F3110 Bipolar disorder, current episode manic without psychotic features, unspecified: Secondary | ICD-10-CM | POA: Diagnosis present

## 2012-02-20 HISTORY — DX: Type 2 diabetes mellitus without complications: E11.9

## 2012-02-20 HISTORY — DX: Essential (primary) hypertension: I10

## 2012-02-20 LAB — COMPREHENSIVE METABOLIC PANEL
ALT: 45 U/L — ABNORMAL HIGH (ref 0–35)
AST: 28 U/L (ref 0–37)
Albumin: 4.1 g/dL (ref 3.5–5.2)
Alkaline Phosphatase: 98 U/L (ref 39–117)
BUN: 42 mg/dL — ABNORMAL HIGH (ref 6–23)
CO2: 23 mEq/L (ref 19–32)
Calcium: 11.9 mg/dL — ABNORMAL HIGH (ref 8.4–10.5)
Chloride: 100 mEq/L (ref 96–112)
Creatinine, Ser: 3.03 mg/dL — ABNORMAL HIGH (ref 0.50–1.10)
GFR calc Af Amer: 19 mL/min — ABNORMAL LOW (ref >90)
GFR calc non Af Amer: 17 mL/min — ABNORMAL LOW (ref >90)
Glucose, Bld: 78 mg/dL (ref 70–99)
Potassium: 5.3 mEq/L — ABNORMAL HIGH (ref 3.5–5.1)
Sodium: 138 mEq/L (ref 135–145)
Total Bilirubin: 0.2 mg/dL — ABNORMAL LOW (ref 0.3–1.2)
Total Protein: 8.7 g/dL — ABNORMAL HIGH (ref 6.0–8.3)

## 2012-02-20 LAB — CBC WITH DIFFERENTIAL/PLATELET
Basophils Absolute: 0 10*3/uL (ref 0.0–0.1)
Basophils Relative: 0 % (ref 0–1)
Eosinophils Absolute: 0.1 10*3/uL (ref 0.0–0.7)
Eosinophils Relative: 1 % (ref 0–5)
HCT: 39.1 % (ref 36.0–46.0)
Hemoglobin: 12.9 g/dL (ref 12.0–15.0)
Lymphocytes Relative: 12 % (ref 12–46)
Lymphs Abs: 1.1 10*3/uL (ref 0.7–4.0)
MCH: 30.2 pg (ref 26.0–34.0)
MCHC: 33 g/dL (ref 30.0–36.0)
MCV: 91.6 fL (ref 78.0–100.0)
Monocytes Absolute: 0.5 10*3/uL (ref 0.1–1.0)
Monocytes Relative: 6 % (ref 3–12)
Neutro Abs: 7.7 10*3/uL (ref 1.7–7.7)
Neutrophils Relative %: 82 % — ABNORMAL HIGH (ref 43–77)
Platelets: 345 10*3/uL (ref 150–400)
RBC: 4.27 MIL/uL (ref 3.87–5.11)
RDW: 14.1 % (ref 11.5–15.5)
WBC: 9.5 10*3/uL (ref 4.0–10.5)

## 2012-02-20 LAB — ACETAMINOPHEN LEVEL: Acetaminophen (Tylenol), Serum: <15 ug/mL (ref 10–30)

## 2012-02-20 LAB — URINALYSIS, ROUTINE W REFLEX MICROSCOPIC
Bilirubin Urine: NEGATIVE
Glucose, UA: NEGATIVE mg/dL
Hgb urine dipstick: NEGATIVE
Leukocytes, UA: NEGATIVE
Nitrite: NEGATIVE
Protein, ur: 100 mg/dL — AB
Specific Gravity, Urine: 1.009 (ref 1.005–1.030)
Urobilinogen, UA: 0.2 mg/dL (ref 0.0–1.0)
pH: 6 (ref 5.0–8.0)

## 2012-02-20 LAB — RAPID URINE DRUG SCREEN, HOSP PERFORMED
Amphetamines: NOT DETECTED
Barbiturates: NOT DETECTED
Benzodiazepines: NOT DETECTED
Cocaine: NOT DETECTED
Opiates: NOT DETECTED
Tetrahydrocannabinol: NOT DETECTED

## 2012-02-20 LAB — GLUCOSE, CAPILLARY: Glucose-Capillary: 144 mg/dL — ABNORMAL HIGH (ref 70–99)

## 2012-02-20 LAB — URINE MICROSCOPIC-ADD ON

## 2012-02-20 LAB — TROPONIN I: Troponin I: <0.3 ng/mL (ref <0.30)

## 2012-02-20 LAB — SALICYLATE LEVEL: Salicylate Lvl: <2 mg/dL — ABNORMAL LOW (ref 2.8–20.0)

## 2012-02-20 LAB — ETHANOL: Alcohol, Ethyl (B): <11 mg/dL (ref 0–11)

## 2012-02-20 LAB — POTASSIUM: Potassium: 4.6 mEq/L (ref 3.5–5.1)

## 2012-02-20 MED ORDER — SODIUM CHLORIDE 0.9 % IJ SOLN: 3.0000 mL | Freq: Two times a day (BID) | INTRAMUSCULAR | Status: DC

## 2012-02-20 MED ORDER — SODIUM CHLORIDE 0.9 % IV SOLN
INTRAVENOUS | Status: DC
  Administered 2012-02-21: 125 mL/h via INTRAVENOUS
  Administered 2012-02-21: 12:00:00 via INTRAVENOUS
  Administered 2012-02-21: 125 mL/h via INTRAVENOUS
  Administered 2012-02-22 – 2012-02-23 (×2): via INTRAVENOUS

## 2012-02-20 MED ORDER — ZIPRASIDONE MESYLATE 20 MG IM SOLR
INTRAMUSCULAR | Status: AC
  Administered 2012-02-20: 20 mg via INTRAMUSCULAR
  Filled 2012-02-20: qty 20

## 2012-02-20 MED ORDER — CLONIDINE HCL 0.1 MG PO TABS
0.1000 mg | ORAL_TABLET | Freq: Two times a day (BID) | ORAL | Status: DC
  Administered 2012-02-21 – 2012-02-22 (×2): 0.1 mg via ORAL
  Filled 2012-02-20 (×7): qty 1

## 2012-02-20 MED ORDER — ACETAMINOPHEN 650 MG RE SUPP: 650.0000 mg | Freq: Four times a day (QID) | RECTAL | Status: DC | PRN

## 2012-02-20 MED ORDER — LORAZEPAM 2 MG/ML IJ SOLN
1.0000 mg | INTRAMUSCULAR | Status: DC | PRN
  Administered 2012-02-21 – 2012-02-22 (×4): 1 mg via INTRAVENOUS
  Filled 2012-02-20 (×5): qty 1

## 2012-02-20 MED ORDER — SODIUM CHLORIDE 0.9 % IV BOLUS (SEPSIS)
1000.0000 mL | Freq: Once | INTRAVENOUS | Status: AC
  Administered 2012-02-20: 1000 mL via INTRAVENOUS

## 2012-02-20 MED ORDER — INSULIN ASPART 100 UNIT/ML IV SOLN
10.0000 [IU] | Freq: Once | INTRAVENOUS | Status: AC
  Administered 2012-02-20: 10 [IU] via INTRAVENOUS
  Filled 2012-02-20: qty 0.1

## 2012-02-20 MED ORDER — ONDANSETRON HCL 4 MG PO TABS
4.0000 mg | ORAL_TABLET | Freq: Four times a day (QID) | ORAL | Status: DC | PRN
  Administered 2012-02-22: 4 mg via ORAL
  Filled 2012-02-20: qty 1

## 2012-02-20 MED ORDER — HEPARIN SODIUM (PORCINE) 5000 UNIT/ML IJ SOLN
5000.0000 [IU] | Freq: Three times a day (TID) | INTRAMUSCULAR | Status: DC
  Administered 2012-02-21 – 2012-02-23 (×6): 5000 [IU] via SUBCUTANEOUS
  Filled 2012-02-20 (×11): qty 1

## 2012-02-20 MED ORDER — CLONIDINE HCL 0.1 MG PO TABS
0.2000 mg | ORAL_TABLET | Freq: Once | ORAL | Status: AC
  Administered 2012-02-20: 0.2 mg via ORAL
  Filled 2012-02-20: qty 2

## 2012-02-20 MED ORDER — SODIUM CHLORIDE 0.9 % IJ SOLN: 3.0000 mL | INTRAMUSCULAR | Status: DC | PRN

## 2012-02-20 MED ORDER — SODIUM CHLORIDE 0.9 % IV SOLN: INTRAVENOUS | Status: DC

## 2012-02-20 MED ORDER — ACETAMINOPHEN 325 MG PO TABS: 650.0000 mg | ORAL_TABLET | Freq: Four times a day (QID) | ORAL | Status: DC | PRN

## 2012-02-20 MED ORDER — SODIUM CHLORIDE 0.9 % IV SOLN: 250.0000 mL | INTRAVENOUS | Status: DC | PRN

## 2012-02-20 MED ORDER — LABETALOL HCL 5 MG/ML IV SOLN
20.0000 mg | Freq: Once | INTRAVENOUS | Status: DC
  Filled 2012-02-20: qty 4

## 2012-02-20 MED ORDER — ONDANSETRON HCL 4 MG/2ML IJ SOLN: 4.0000 mg | Freq: Four times a day (QID) | INTRAMUSCULAR | Status: DC | PRN

## 2012-02-20 MED ORDER — DEXTROSE 50 % IV SOLN
1.0000 | Freq: Once | INTRAVENOUS | Status: AC
  Administered 2012-02-20: 50 mL via INTRAVENOUS
  Filled 2012-02-20: qty 50

## 2012-02-20 MED ORDER — AMLODIPINE BESYLATE 10 MG PO TABS
10.0000 mg | ORAL_TABLET | Freq: Every day | ORAL | Status: DC
  Administered 2012-02-22: 10 mg via ORAL
  Filled 2012-02-20 (×3): qty 1

## 2012-02-20 MED ORDER — METOPROLOL TARTRATE 1 MG/ML IV SOLN
2.5000 mg | Freq: Four times a day (QID) | INTRAVENOUS | Status: DC
  Administered 2012-02-21: 2.5 mg via INTRAVENOUS
  Filled 2012-02-20 (×6): qty 5

## 2012-02-20 NOTE — ED Notes (Signed)
NP informed of patient's BP-offered po's to patient-refusing to answer RN's questions-sitter at bedside

## 2012-02-20 NOTE — ED Notes (Signed)
Per Arlys John at poison control, given benzo's for agitation/seizures-assess temp and treat if needed-EKG/labs(cpk, ABG, lytes, lft's)-toxicologist's recommends not using geodon for agitation

## 2012-02-20 NOTE — ED Notes (Signed)
Poison control called

## 2012-02-20 NOTE — ED Notes (Signed)
Patient laughing inappropriately-refusing vital signs-not responding to questioning-not making sense-oriented X1

## 2012-02-20 NOTE — ED Notes (Signed)
Patient was cathed at 19:38.  Output was 1200 cc.   Additional second liter was given per Thurston Hole, NP Patient has not urinated since cath and Dr Brien Few was notified Ordered a foley

## 2012-02-20 NOTE — ED Notes (Signed)
Unable to get vitals at this time charge nurse aware
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
## 2012-02-20 NOTE — ED Provider Notes (Signed)
History     CSN: [id number]  Arrival date & time 02/20/12  1011   None     Chief Complaint  Patient presents with  . Altered Mental Status    (Consider location/radiation/quality/duration/timing/severity/associated sxs/prior treatment) Patient is a 51 y.o. female presenting with altered mental status. The history is provided by the patient and the EMS personnel. No language interpreter was used.  Altered Mental Status This is a new problem. The current episode started today. The problem occurs constantly. Associated symptoms include vomiting. Pertinent negatives include no abdominal pain, chest pain, congestion, coughing, fever, nausea or neck pain.   51 year old female found down in the bathroom by a neighbor who broke into the house. Patient had an empty bottle of "hyper fuel 9x" at her side. EMS reports that there was vomit on the floors with red pill particles in it. Patient is inappropriate and follows commands intermittently. She is focusing and following, her pupils are equal and reactive. Very combative, agitated and paranoid of the people around her. She denies any pain she is on a backboard which was removed. Verbal communication is inappropriate.  Seems to be hallucinating. Police officer on the scene reports she had baby oil all over the floors in the house.   Patient has a past medical history of bipolar and family was notified. Family states that she does behave this way in  her manic phase. Family left for out of town and apparently left her in the house alone. pmh listed below.    Past Medical History  Diagnosis Date  . Mental disorder   . Depression   . Hypertension   . Diabetes mellitus without complication     No past surgical history on file.  No family history on file.  History  Substance Use Topics  . Smoking status: Current Every Day Smoker  . Smokeless tobacco: Not on file  . Alcohol Use: Yes    OB History    Grav Para Term Preterm Abortions TAB SAB  Ect Mult Living                  Review of Systems  Unable to perform ROS Constitutional: Negative for fever.  HENT: Negative for congestion and neck pain.   Respiratory: Negative for cough and shortness of breath.   Cardiovascular: Negative for chest pain.  Gastrointestinal: Positive for vomiting. Negative for nausea and abdominal pain.  Psychiatric/Behavioral: Positive for altered mental status.    Allergies  Codeine sulfate  Home Medications   Current Outpatient Rx  Name Route Sig Dispense Refill  . AMLODIPINE BESYLATE 10 MG PO TABS Oral Take 10 mg by mouth daily.    Marland Kitchen DIVALPROEX SODIUM 250 MG PO TBEC Oral Take 250 mg by mouth daily.    Marland Kitchen ESZOPICLONE 1 MG PO TABS Oral Take 3 mg by mouth at bedtime. Take immediately before bedtime    . FUROSEMIDE 20 MG PO TABS Oral Take 20 mg by mouth 2 (two) times daily.    Marland Kitchen METOPROLOL SUCCINATE ER 50 MG PO TB24 Oral Take 50 mg by mouth daily. Take with or immediately following a meal.    . QUETIAPINE FUMARATE 100 MG PO TABS Oral Take 100 mg by mouth at bedtime.    Marland Kitchen ZOLPIDEM TARTRATE 5 MG PO TABS Oral Take 1 tablet (5 mg total) by mouth at bedtime as needed for sleep. 7 tablet 0    There were no vitals taken for this visit.  Physical Exam  Nursing note  and vitals reviewed. Constitutional: She appears well-developed and well-nourished.  HENT:  Head: Normocephalic and atraumatic.  Eyes: Conjunctivae normal and EOM are normal. Pupils are equal, round, and reactive to light.  Neck: Normal range of motion. Neck supple.  Cardiovascular: Normal rate, regular rhythm, normal heart sounds and intact distal pulses.  Exam reveals no gallop and no friction rub.   No murmur heard. Pulmonary/Chest: Effort normal and breath sounds normal.  Abdominal: Soft. Bowel sounds are normal.  Musculoskeletal: Normal range of motion. She exhibits no edema and no tenderness.  Neurological: She is alert.       Bazaar behavior with inappropriate speech.  Paranoid  behavior aggitated and combative  Skin: Skin is warm and dry.  Psychiatric: She is agitated and aggressive. Thought content is paranoid. She expresses impulsivity. She is noncommunicative.       Non communicative    ED Course  Procedures (including critical care time) 1600 patient remains tachycardic and hypertensive. Clonidine ordered. 2 L of nose normal saline was ordered but has not been given. Will get her b/p down and get her admitted for observation.  Also a delay in the urine drug screen due to ?.  2100  Patients hr remains > 110.  Will admit for observation.  I still think this patient needs to a behavioral health evaluation for meds when HR is stable.  She is taking pos now but not responding.  Again this is what the family stated she acts like when she is in a manic phase.      Labs Reviewed  CBC WITH DIFFERENTIAL  COMPREHENSIVE METABOLIC PANEL  URINE RAPID DRUG SCREEN (HOSP PERFORMED)  ETHANOL   No results found.   No diagnosis found.    MDM  Admitted for ? OD on hyperfuel 9x.  Patient was found on the bathroom floor nonverbal by a neighbor who broke into the house found an empty bottle in her hand.   Poison control said to observe for tachycardia.  Family left her alone for the week and went to [facility].  pmh of bipolar.  Patient is non verbally communicating and her HR is still > 110. Suspect she has been off her meds for some time.  D50 and insulin was given for k 5.3 and it came down to 4.5.  CT head and cervical spine unremarkable reviewed by myself.   Taking pos and swallowing pills now.  Remains tachycardic.  I belief her behavior is baseline for being off her psych meds.     Labs Reviewed  CBC WITH DIFFERENTIAL - Abnormal; Notable for the following:    Neutrophils Relative 82 (*)     All other components within normal limits  COMPREHENSIVE METABOLIC PANEL - Abnormal; Notable for the following:    Potassium 5.3 (*)  NO VISIBLE HEMOLYSIS   BUN 42 (*)     Creatinine,  Ser 3.03 (*)     Calcium 11.9 (*)     Total Protein 8.7 (*)     ALT 45 (*)     Total Bilirubin 0.2 (*)     GFR calc non Af Amer 17 (*)     GFR calc Af Amer 19 (*)     All other components within normal limits  SALICYLATE LEVEL - Abnormal; Notable for the following:    Salicylate Lvl <2.0 (*)     All other components within normal limits  GLUCOSE, CAPILLARY - Abnormal; Notable for the following:    Glucose-Capillary 144 (*)  All other components within normal limits  URINALYSIS, ROUTINE W REFLEX MICROSCOPIC - Abnormal; Notable for the following:    Ketones, ur TRACE (*)     Protein, ur 100 (*)     All other components within normal limits  URINE MICROSCOPIC-ADD ON - Abnormal; Notable for the following:    Squamous Epithelial / LPF FEW (*)     Bacteria, UA FEW (*)     All other components within normal limits  URINE RAPID DRUG SCREEN (HOSP PERFORMED)  ETHANOL  ACETAMINOPHEN LEVEL  POTASSIUM  URINE RAPID DRUG SCREEN (HOSP PERFORMED)           Remi Haggard, NP 02/21/12 1916

## 2012-02-20 NOTE — ED Notes (Signed)
Off floor for testing 

## 2012-02-20 NOTE — ED Notes (Signed)
Per CT, unable to do test at this time-will attempt later once patient more compliant

## 2012-02-20 NOTE — ED Notes (Signed)
Per EMS, found on floor in bathroom by family-does not know if she fell-non-responsive to EMS 's questioning-empty bottle of pills found beside her(hyper fuel 9X)-appeared she vomited pills-placed on board

## 2012-02-20 NOTE — ED Notes (Signed)
Patient uncooperative-unwilling to be hooked up to monitor at this time-s[employer] at bedside

## 2012-02-20 NOTE — ED Notes (Signed)
Able to do CT-patient denies pain at this time-unable to focus on simple questions-sitter at bedside-will continue to monitor

## 2012-02-20 NOTE — ED Notes (Signed)
JYN:[id number]<AO> Expected date:<BR> Expected time:<BR> Means of arrival:<BR> Comments:<BR> ?OD/confused/found on floor

## 2012-02-21 ENCOUNTER — Encounter (HOSPITAL_COMMUNITY): Payer: Self-pay

## 2012-02-21 DIAGNOSIS — T50901A Poisoning by unspecified drugs, medicaments and biological substances, accidental (unintentional), initial encounter: Secondary | ICD-10-CM

## 2012-02-21 DIAGNOSIS — N189 Chronic kidney disease, unspecified: Secondary | ICD-10-CM | POA: Diagnosis not present

## 2012-02-21 DIAGNOSIS — N179 Acute kidney failure, unspecified: Secondary | ICD-10-CM | POA: Diagnosis not present

## 2012-02-21 DIAGNOSIS — F319 Bipolar disorder, unspecified: Secondary | ICD-10-CM

## 2012-02-21 LAB — TROPONIN I: Troponin I: <0.3 ng/mL (ref <0.30)

## 2012-02-21 LAB — COMPREHENSIVE METABOLIC PANEL
ALT: 32 U/L (ref 0–35)
AST: 16 U/L (ref 0–37)
Albumin: 3.3 g/dL — ABNORMAL LOW (ref 3.5–5.2)
Alkaline Phosphatase: 78 U/L (ref 39–117)
BUN: 39 mg/dL — ABNORMAL HIGH (ref 6–23)
CO2: 21 mEq/L (ref 19–32)
Calcium: 10.7 mg/dL — ABNORMAL HIGH (ref 8.4–10.5)
Chloride: 109 mEq/L (ref 96–112)
Creatinine, Ser: 2.8 mg/dL — ABNORMAL HIGH (ref 0.50–1.10)
GFR calc Af Amer: 21 mL/min — ABNORMAL LOW (ref >90)
GFR calc non Af Amer: 18 mL/min — ABNORMAL LOW (ref >90)
Glucose, Bld: 74 mg/dL (ref 70–99)
Potassium: 4.9 mEq/L (ref 3.5–5.1)
Sodium: 144 mEq/L (ref 135–145)
Total Bilirubin: 0.3 mg/dL (ref 0.3–1.2)
Total Protein: 7.6 g/dL (ref 6.0–8.3)

## 2012-02-21 LAB — CBC
HCT: 36.6 % (ref 36.0–46.0)
Hemoglobin: 11.5 g/dL — ABNORMAL LOW (ref 12.0–15.0)
MCH: 29.2 pg (ref 26.0–34.0)
MCHC: 31.4 g/dL (ref 30.0–36.0)
MCV: 92.9 fL (ref 78.0–100.0)
Platelets: 308 10*3/uL (ref 150–400)
RBC: 3.94 MIL/uL (ref 3.87–5.11)
RDW: 14.3 % (ref 11.5–15.5)
WBC: 6.2 10*3/uL (ref 4.0–10.5)

## 2012-02-21 MED ORDER — METOPROLOL SUCCINATE ER 100 MG PO TB24
100.0000 mg | ORAL_TABLET | Freq: Every day | ORAL | Status: DC
  Administered 2012-02-22 – 2012-02-23 (×2): 100 mg via ORAL
  Filled 2012-02-21 (×4): qty 1

## 2012-02-21 MED ORDER — LABETALOL HCL 5 MG/ML IV SOLN: 5.0000 mg | INTRAVENOUS | Status: DC | PRN

## 2012-02-21 MED ORDER — HYDRALAZINE HCL 20 MG/ML IJ SOLN: 5.0000 mg | INTRAMUSCULAR | Status: DC | PRN

## 2012-02-21 MED ORDER — LABETALOL HCL 5 MG/ML IV SOLN
10.0000 mg | Freq: Once | INTRAVENOUS | Status: AC
  Administered 2012-02-21: 10 mg via INTRAVENOUS
  Filled 2012-02-21: qty 4

## 2012-02-21 NOTE — Progress Notes (Signed)
Subjective: Patient seen and examined, non verbal, she was admitted with drug overdose. Patient was found on bathroom floor with empty bottle of hyper fuel 9x at the side.   Objective: Vital signs in last 24 hours: Temp:  [98.3 F (36.8 C)-99.3 F (37.4 C)] 98.3 F (36.8 C) (10/15 0647) Pulse Rate:  [115-129] 120  (10/15 0647) Resp:  [16-21] 20  (10/15 0647) BP: (120-171)/(77-117) 128/80 mmHg (10/15 0647) SpO2:  [93 %-97 %] 93 % (10/15 0647) Weight:  [92.8 kg (204 lb 9.4 oz)] 92.8 kg (204 lb 9.4 oz) (10/15 0149) Weight change:     Consults: Psychiatry  Procedures: None  Intake/Output from previous day: 10/14 0701 - 10/15 0700 In: -  Out: 4300 [Urine:4300]     Physical Exam: Head: Normocephalic, atraumatic.  Eyes: No signs of jaundice, EOMI Nose: Mucous membranes dry.  Neck: supple,No deformities, masses, or tenderness noted. Lungs: Normal respiratory effort. B/L Clear to auscultation, no crackles or wheezes.  Heart: Regular RR. S1 and S2 normal  Abdomen: BS normoactive. Soft, Nondistended, non-tender.  Extremities: No pretibial edema, no erythema   Lab Results: Basic Metabolic Panel:  Basename 02/21/12 0444 02/20/12 1535 02/20/12 1115  NA 144 -- 138  K 4.9 4.6 --  CL 109 -- 100  CO2 21 -- 23  GLUCOSE 74 -- 78  BUN 39* -- 42*  CREATININE 2.80* -- 3.03*  CALCIUM 10.7* -- 11.9*  MG -- -- --  PHOS -- -- --   Liver Function Tests:  [facility] 02/21/12 0444 02/20/12 1115  AST 16 28  ALT 32 45*  ALKPHOS 78 98  BILITOT 0.3 0.2*  PROT 7.6 8.7*  ALBUMIN 3.3* 4.1     Basename 02/21/12 0444 02/20/12 1115  WBC 6.2 9.5  NEUTROABS -- 7.7  HGB 11.5* 12.9  HCT 36.6 39.1  MCV 92.9 91.6  PLT 308 345   Cardiac Enzymes:  Basename 02/21/12 0444 02/20/12 2235  CKTOTAL -- --  CKMB -- --  CKMBINDEX -- --  TROPONINI <0.30 <0.30   BNP: No results found for this basename: PROBNP:3 in the last 72 hours D-Dimer: No results found for this basename: DDIMER:2 in  the last 72 hours CBG:  Basename 02/20/12 1446  GLUCAP 144*   Urine Drug Screen: Drugs of Abuse     Component Value Date/Time   LABOPIA NONE DETECTED 02/20/2012 1938   COCAINSCRNUR NONE DETECTED 02/20/2012 1938   LABBENZ NONE DETECTED 02/20/2012 1938   AMPHETMU NONE DETECTED 02/20/2012 1938   THCU NONE DETECTED 02/20/2012 1938   LABBARB NONE DETECTED 02/20/2012 1938    Alcohol Level:  Basename 02/20/12 1115  ETH <11   Urinalysis:  Basename 02/20/12 1938  COLORURINE YELLOW  LABSPEC 1.009  PHURINE 6.0  GLUCOSEU NEGATIVE  HGBUR NEGATIVE  BILIRUBINUR NEGATIVE  KETONESUR TRACE*  PROTEINUR 100*  UROBILINOGEN 0.2  NITRITE NEGATIVE  LEUKOCYTESUR NEGATIVE     Studies/Results: Ct Head Wo Contrast  02/20/2012  *RADIOLOGY REPORT*  Clinical Data: Fall, altered L O C  CT HEAD WITHOUT CONTRAST,CT CERVICAL SPINE WITHOUT CONTRAST  Technique:  Contiguous axial images were obtained from the base of the skull through the vertex without contrast.,Technique: Multidetector CT imaging of the cervical spine was performed. Multiplanar CT image reconstructions were also generated.  Comparison: None.  Findings: No skull fracture is noted.  The paranasal sinuses and mastoid air cells are unremarkable.  No intracranial hemorrhage, mass effect or midline shift.  No mass lesion is noted on this unenhanced scan.  The  gray and white matter differentiation is preserved.  IMPRESSION:  No acute intracranial abnormality.  CT cervical spine without IV contrast.  Findings:  Axial images of the cervical spine shows no acute fracture or subluxation.  Computer processed images shows no acute fracture or subluxation. There is mild anterior spurring at C4-C5 level.  Mild disc space flattening with mild anterior and mild posterior spurring noted at C5-C6 and C6-C7 level.  Mild disc space flattening at C 7 T1 level. No prevertebral soft tissue swelling.  Cervical airway is patent.  Impression: 1.  No acute fracture or  subluxation.  Mild degenerative changes as described above.   Original Report Authenticated By: Natasha Mead, M.D.    Ct Cervical Spine Wo Contrast  02/20/2012  *RADIOLOGY REPORT*  Clinical Data: Fall, altered L O C  CT HEAD WITHOUT CONTRAST,CT CERVICAL SPINE WITHOUT CONTRAST  Technique:  Contiguous axial images were obtained from the base of the skull through the vertex without contrast.,Technique: Multidetector CT imaging of the cervical spine was performed. Multiplanar CT image reconstructions were also generated.  Comparison: None.  Findings: No skull fracture is noted.  The paranasal sinuses and mastoid air cells are unremarkable.  No intracranial hemorrhage, mass effect or midline shift.  No mass lesion is noted on this unenhanced scan.  The gray and white matter differentiation is preserved.  IMPRESSION:  No acute intracranial abnormality.  CT cervical spine without IV contrast.  Findings:  Axial images of the cervical spine shows no acute fracture or subluxation.  Computer processed images shows no acute fracture or subluxation. There is mild anterior spurring at C4-C5 level.  Mild disc space flattening with mild anterior and mild posterior spurring noted at C5-C6 and C6-C7 level.  Mild disc space flattening at C 7 T1 level. No prevertebral soft tissue swelling.  Cervical airway is patent.  Impression: 1.  No acute fracture or subluxation.  Mild degenerative changes as described above.   Original Report Authenticated By: Natasha Mead, M.D.     Medications: Scheduled Meds:   . amLODipine  10 mg Oral Daily  . cloNIDine  0.1 mg Oral BID  . cloNIDine  0.2 mg Oral Once  . dextrose  1 ampule Intravenous Once  . heparin  5,000 Units Subcutaneous Q8H  . insulin aspart  10 Units Intravenous Once  . labetalol  20 mg Intravenous Once  . metoprolol  2.5 mg Intravenous Q6H  . sodium chloride  1,000 mL Intravenous Once  . sodium chloride  1,000 mL Intravenous Once  . sodium chloride  3 mL Intravenous Q12H    . sodium chloride  3 mL Intravenous Q12H   Continuous Infusions:   . sodium chloride 125 mL/hr at 02/21/12 1155   Followed by  . sodium chloride     PRN Meds:.sodium chloride, acetaminophen, acetaminophen, LORazepam, ondansetron (ZOFRAN) IV, ondansetron, sodium chloride    Active Problems:  Drug overdose  Bipolar disorder  Dehydration  ARF (acute renal failure)  CKD (chronic kidney disease)  Tachycardia  HTN (hypertension)  Drug overdose Poison control was called by ED  physician, and they recommended supportive management as it is mainly caffeine containing medication.  Bipolar disorder Psych consult has been obtained to adjust the medications.  Acute renal failure Patient has baseline Creatinine of 2-22-2.61, and she came with cr of 3.03 and BUN of 42. She is currently on IV fluids @ 100 ml/hr.  Hypertension BP is controlled Continue amlodipine, clonidine,  Will d/c IV  metoprolol and start po  metoprolol.  DVT prophylaxis heparin   LOS: 1 day  Assessment/Plan: [facility] Pager: [phone] 02/21/2012, 2:04 PM

## 2012-02-21 NOTE — Consult Note (Signed)
Patient Identification:  Jennifer Lawson Date of Evaluation:  02/21/2012 Reason for Consult: overdose, vomitus in B.Rm containing pill particles  Referring Provider: Dr. Sharl Ma History of Present Illness:found down by neighbor  EMS called.    Past Psychiatric History:unknown    Past Medical History:     Past Medical History  Diagnosis Date  . Mental disorder   . Depression   . Hypertension   . Diabetes mellitus without complication       History reviewed. No pertinent past surgical history.  Allergies:  Allergies  Allergen Reactions  . Codeine Sulfate Anaphylaxis    Daughter called about having this allergy     Current Medications:  Prior to Admission medications   Medication Sig Start Date End Date Taking? Authorizing Provider  buPROPion (WELLBUTRIN SR) 150 MG 12 hr tablet Take 150 mg by mouth daily.   Yes Historical Provider, MD  cloNIDine (CATAPRES) 0.1 MG tablet Take 0.1 mg by mouth 2 (two) times daily.   Yes Historical Provider, MD  divalproex (DEPAKOTE ER) 500 MG 24 hr tablet Take 1,000 mg by mouth at bedtime.   Yes Historical Provider, MD  doxepin (SINEQUAN) 150 MG capsule Take 150 mg by mouth at bedtime.   Yes Historical Provider, MD  furosemide (LASIX) 40 MG tablet Take 40 mg by mouth daily.   Yes Historical Provider, MD  lamoTRIgine (LAMICTAL) 25 MG tablet Take 25 mg by mouth daily. 1 tablet orally every other day  x14 days then increase to 1 tablet daily x 14 days. rx filled 11/21/12   Yes Historical Provider, MD  metoprolol succinate (TOPROL-XL) 100 MG 24 hr tablet Take 100 mg by mouth daily. Take with or immediately following a meal.   Yes Historical Provider, MD  ziprasidone (GEODON) 60 MG capsule Take 120 mg by mouth at bedtime.   Yes Historical Provider, MD  zolpidem (AMBIEN) 10 MG tablet Take 10 mg by mouth at bedtime.   Yes Historical Provider, MD  amLODipine (NORVASC) 10 MG tablet Take 10 mg by mouth daily.    Historical Provider, MD  eszopiclone (LUNESTA) 1  MG TABS Take 3 mg by mouth at bedtime. Take immediately before bedtime    Historical Provider, MD  QUEtiapine (SEROQUEL) 100 MG tablet Take 100 mg by mouth at bedtime.    Historical Provider, MD  zolpidem (AMBIEN) 5 MG tablet Take 5 mg by mouth at bedtime. 08/31/11 08/30/12  Celene Kras, MD    Social History:    reports that she has been smoking.  She does not have any smokeless tobacco history on file. She reports that she drinks alcohol. She reports that she does not use illicit drugs.   Family History:    History reviewed. No pertinent family history.  Mental Status Examination/Evaluation: Objective:  Appearance: Disheveled and long hair extending in all directions from scalp eyes wide open; stares without blinking  Psychomotor Activity:  immobile hands clutched in decerebrate posture  Eye Contact::  Good  Speech:  none  Volume:  none  Mood:  unstated  Affect: unstated                  DIAGNOSIS:   AXIS I Undetermined  AXIS II  Deffered  AXIS III See medical notes.  AXIS IV Patient is unable to speak we'll continue to follow  AXIS V 41-50 serious symptoms   Assessment/Plan: Dr. Sharl Ma Notes available only.  Pt has fingers clinched.  Staring at CSW and MD.  She is mute  and makes no attempt or expression that she struggles to say anything.    She was found down in the bathroom by neighbor wo broke into the house.  There was an empty bottle of "Hyperfuel 9X".  (unknown substance) RECOMMENDATION: 1.  Pt is mute.  Will follow patient Jennifer Lawson J. Jennifer Luz, MD Psychiatrist  02/21/2012  1:24 PM

## 2012-02-21 NOTE — Progress Notes (Signed)
Pt refused blood draw by lab and refused taking her meds.  She swung her arm at me when I was just trying to scan her armband.

## 2012-02-21 NOTE — Progress Notes (Signed)
Poison control just called to check patient status.

## 2012-02-21 NOTE — Progress Notes (Signed)
Psych MD and psych CSW attempted to meet with Pt to discuss current admission.  Pt was awake but unresponsive.  Pt would not answer questions.  Chart reviewed.  Psych MD and psych CSW to attempt to meet with Pt tomorrow.  Providence Crosby, LCSWA Clinical Social Work [phone]

## 2012-02-21 NOTE — Progress Notes (Signed)
Nutrition Brief Note  Patient screened for low braden.   Body mass index is 32.04 kg/(m^2). Pt meets criteria for class I obesity based on current BMI.   Current diet order is full liquid, patient is consuming approximately 50% of meals at this time. Labs and medications reviewed. Pt unable to provide meaningful history r/t drug overdose.   No nutrition interventions warranted at this time. If nutrition issues arise, please consult RD.   Levon Hedger MS, RD, LDN [phone] Pager [phone] After Hours Pager

## 2012-02-22 DIAGNOSIS — R4182 Altered mental status, unspecified: Secondary | ICD-10-CM | POA: Diagnosis not present

## 2012-02-22 DIAGNOSIS — T50901A Poisoning by unspecified drugs, medicaments and biological substances, accidental (unintentional), initial encounter: Secondary | ICD-10-CM | POA: Diagnosis not present

## 2012-02-22 LAB — BASIC METABOLIC PANEL
BUN: 34 mg/dL — ABNORMAL HIGH (ref 6–23)
CO2: 23 mEq/L (ref 19–32)
Calcium: 10.3 mg/dL (ref 8.4–10.5)
Chloride: 114 mEq/L — ABNORMAL HIGH (ref 96–112)
Creatinine, Ser: 2.35 mg/dL — ABNORMAL HIGH (ref 0.50–1.10)
GFR calc Af Amer: 26 mL/min — ABNORMAL LOW (ref >90)
GFR calc non Af Amer: 23 mL/min — ABNORMAL LOW (ref >90)
Glucose, Bld: 77 mg/dL (ref 70–99)
Potassium: 4.7 mEq/L (ref 3.5–5.1)
Sodium: 146 mEq/L — ABNORMAL HIGH (ref 135–145)

## 2012-02-22 NOTE — Progress Notes (Signed)
PROGRESS NOTE  Moira Umholtz ZOX:[id number] DOB: [birth year] DOA: 02/20/2012 PCP: No primary provider on file.  Brief narrative: 51 year old female with multiple issues to behavioral [facility] admitted 02/20/2012 with altered mental status  Past medical history-As per Problem list Chart reviewed as below-  Admission to [facility] for abnormal/manic behavior   Consultants:  Psychiatry  Procedures:  CT cervical spine/CT head without contrast 10/14 2013 = negative  Antibiotics:  None   Subjective  Patient much more verbal than reported yesterday-eating lunch at bedside Wishes to go home Unclear as to how she came to the hospital States that she was washing up complaining of the house in the bathroom as her daughter was out of town [place] with daughter who states that patient was found by patient's friend and neighbor On the floor and unconscious Patient had a similar episode in February of this year-patient's daughter states that the patient patient has become very manic but this has not happened since February Patient is able to tell me the hospital she is in, the state, does not know which [place],  And has 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, petroleum serial se[employer], cannot recall 3 items instead remembers to   Objective    Interim History: Chart reviewed  Telemetry: Sinus tachycardia 90-100 alert, oriented x2-flat affect and seems somewhat confused  Objective: Filed Vitals:   02/21/12 2206 02/21/12 2351 02/22/12 0549 02/22/12 1318  BP: 166/111 125/76 145/97 139/89  Pulse: 100  106 83  Temp: 98.9 F (37.2 C)  98.3 F (36.8 C) 98.9 F (37.2 C)  TempSrc: Axillary  Oral Oral  Resp: 21  18 18   Height:      Weight:      SpO2: 97%  93% 95%    Intake/Output Summary (Last 24 hours) at 02/22/12 1349 Last data filed at 02/22/12 0900  Gross per 24 hour  Intake   1680 ml  Output   4400 ml  Net  -2720 ml    Exam:  General:  alert oriented x2 flat affect  and seems somewhat confused as to why she is here Cardiovascular: S1-S2 slightly tachycardic Respiratory: Clinically clear Abdomen: Soft, nontender, nondistended Skin no rash or edema Neuro grossly intact  Data Reviewed: Basic Metabolic Panel:  Lab 02/22/12 8119 02/21/12 0444 02/20/12 1115  NA 146* 144 138  K 4.7 4.9 --  CL 114* 109 100  CO2 23 21 23   GLUCOSE 77 74 78  BUN 34* 39* 42*  CREATININE 2.35* 2.80* 3.03*  CALCIUM 10.3 10.7* 11.9*  MG -- -- --  PHOS -- -- --   Liver Function Tests:  Lab 02/21/12 0444 02/20/12 1115  AST 16 28  ALT 32 45*  ALKPHOS 78 98  BILITOT 0.3 0.2*  PROT 7.6 8.7*  ALBUMIN 3.3* 4.1   No results found for this basename: LIPASE:5,AMYLASE:5 in the last 168 hours No results found for this basename: AMMONIA:5 in the last 168 hours CBC:  Lab 02/21/12 0444 02/20/12 1115  WBC 6.2 9.5  NEUTROABS -- 7.7  HGB 11.5* 12.9  HCT 36.6 39.1  MCV 92.9 91.6  PLT 308 345   Cardiac Enzymes:  Lab 02/21/12 0444 02/20/12 2235  CKTOTAL -- --  CKMB -- --  CKMBINDEX -- --  TROPONINI <0.30 <0.30   BNP: No components found with this basename: POCBNP:5 CBG:  Lab 02/20/12 1446  GLUCAP 144*    No results found for this or any previous visit (from the past 240 hour(s)).   Studies:  All Imaging reviewed and is as per above notation   Scheduled Meds:   . amLODipine  10 mg Oral Daily  . cloNIDine  0.1 mg Oral BID  . heparin  5,000 Units Subcutaneous Q8H  . labetalol  10 mg Intravenous Once  . metoprolol succinate  100 mg Oral Daily  . sodium chloride  3 mL Intravenous Q12H  . sodium chloride  3 mL Intravenous Q12H  . DISCONTD: labetalol  20 mg Intravenous Once  . DISCONTD: metoprolol  2.5 mg Intravenous Q6H   Continuous Infusions:   . sodium chloride 125 mL/hr at 02/22/12 1227   Followed by  . sodium chloride       Assessment/Plan:  1. Drug overdose-patient seems to be still slightly tachycardic however the  caffeine-containing medication should wear off. 2. Acute renal insufficiency-trending down from admission level of 3.0 to today 2.3, continue IV saline at 125 cc per hour 3. 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 Patient can narrow the name medications that she's been on however from report is noted the patient is on Geodon 120 mg by mouth at bedtime, Lunesta 3 milligrams at bedtime, Ambien 10 mg at bedtime, Depakote 500 mg every 24 hourly, doxepin 150 mg each bedtime, bupropion 150 mg every 12 hourly. Continue one-to-one sitter 4. Hypertension-moderately well controlled-continue clonidine 0.1 mg twice a day, amlodipine 10 mg daily, metoprolol XL 100mg  5. Tobacco abuse 6. ? History diabetes mellitus-blood sugars and his metabolic panel in the 70s-would hold any antiplatelet agents at present time 7. Contraction alkalosis-cut back IV fluids 75 cc per hour.  Code Status: Full Family Communication: Discussed with daughter on telephone regarding plan of care and need for psychiatric evaluation Disposition Plan: Inpatient   Pleas Koch, MD  [facility] Pager [phone] 02/22/2012, 1:49 PM    LOS: 2 days

## 2012-02-22 NOTE — Progress Notes (Signed)
Patient Identification:  Jennifer Lawson Date of Evaluation:  02/22/2012 Reason for Consult:  Pt was found unconscious with an empty (high energy drink) can near her  Referring Provider: Dr. Mahala Menghini  History of Present Illness: yesterday, pt would only stare without any evidence of receptive processing.  She was in a fixed position; no eye blinking, just a vacuous stare.  Her fingers were in a decerebrate posture.   Past Psychiatric History:she has taken medication   Past Medical History:     Past Medical History  Diagnosis Date  . Mental disorder   . Depression   . Hypertension   . Diabetes mellitus without complication       History reviewed. No pertinent past surgical history.  Allergies:  Allergies  Allergen Reactions  . Codeine Sulfate Anaphylaxis    Daughter called about having this allergy     Current Medications:  Prior to Admission medications   Medication Sig Start Date End Date Taking? Authorizing Provider  buPROPion (WELLBUTRIN SR) 150 MG 12 hr tablet Take 150 mg by mouth daily.   Yes Historical Provider, MD  cloNIDine (CATAPRES) 0.1 MG tablet Take 0.1 mg by mouth 2 (two) times daily.   Yes Historical Provider, MD  divalproex (DEPAKOTE ER) 500 MG 24 hr tablet Take 1,000 mg by mouth at bedtime.   Yes Historical Provider, MD  doxepin (SINEQUAN) 150 MG capsule Take 150 mg by mouth at bedtime.   Yes Historical Provider, MD  furosemide (LASIX) 40 MG tablet Take 40 mg by mouth daily.   Yes Historical Provider, MD  lamoTRIgine (LAMICTAL) 25 MG tablet Take 25 mg by mouth daily. 1 tablet orally every other day  x14 days then increase to 1 tablet daily x 14 days. rx filled 11/21/12   Yes Historical Provider, MD  metoprolol succinate (TOPROL-XL) 100 MG 24 hr tablet Take 100 mg by mouth daily. Take with or immediately following a meal.   Yes Historical Provider, MD  ziprasidone (GEODON) 60 MG capsule Take 120 mg by mouth at bedtime.   Yes Historical Provider, MD  zolpidem  (AMBIEN) 10 MG tablet Take 10 mg by mouth at bedtime.   Yes Historical Provider, MD  amLODipine (NORVASC) 10 MG tablet Take 10 mg by mouth daily.    Historical Provider, MD  eszopiclone (LUNESTA) 1 MG TABS Take 3 mg by mouth at bedtime. Take immediately before bedtime    Historical Provider, MD  QUEtiapine (SEROQUEL) 100 MG tablet Take 100 mg by mouth at bedtime.    Historical Provider, MD  zolpidem (AMBIEN) 5 MG tablet Take 5 mg by mouth at bedtime. 08/31/11 08/30/12  Celene Kras, MD    Social History:    reports that she has been smoking.  She does not have any smokeless tobacco history on file. She reports that she drinks alcohol. She reports that she does not use illicit drugs.   Family History:    History reviewed. No pertinent family history.  Mental Status Examination/Evaluation: Objective:  Appearance: Disheveled and wwake and alert asking repetitive questions.Marland Kitchen Perseverates about talking with the daughter (RN states when she calls the daughter, the daughter hangs up)  Psychomotor Activity:  Normal  Eye Contact::  Good  Speech:  Clear and Coherent and Perseverations  Volume:  With the tone of anxiety  Mood:  Anxious and Dysphoric  Affect:  Congruent, Inappropriate and Hyper-focused on her children  Thought Process:  Irrelevant, Tangential and Unable to engage in dialogue  Orientation:  Other:  Patient  appears to be under the influence of whatever substance she took. She is not thinking clearly nor is she able to make appropriate responses  Thought Content:  Paranoia  Suicidal Thoughts:  No  Homicidal Thoughts:  No  Judgement:  Poor  Insight:  Lacking    DIAGNOSIS:   AXIS I   altered mental status, presumptive toxicity or metabolic imbalance.   AXIS II  Deffered  AXIS III See medical notes.  AXIS IV other psychosocial or environmental problems, problems related to social environment, problems with primary support group and parenting preoccupations  AXIS V 41-50 serious  symptoms   Assessment/Plan:  Discussed with Dr. Mahala Menghini, Psych CSW Patient is awake and alert which is encouraging. She has good eye contact and has verbally responded to greeting. She becomes very preoccupied with confusion and trying to understand why she is in the hospital. She keeps saying she was trying to clean the house in time for her grandson to arrive.  She repeats this several times and then she begins to repeat that she is wondering where her children are and how she got "here".  At this point she continues to perseverate on these topics and is not able to engage in dialogue about her medical problems, any recall of events prior to her becoming unconscious-except for the fact she was house cleaning.  RECOMMENDATION:  The evaluation is nonproductive and Psych CSW is notified and Dr Mahala Menghini that evaluation will continue tomorrow. Denyla Cortese J. Ferol Luz, MD Psychiatrist  02/22/2012 12:58 PM

## 2012-02-22 NOTE — ED Provider Notes (Signed)
Medical screening examination/treatment/procedure(s) were performed by non-physician practitioner and as supervising physician I was immediately available for consultation/collaboration.  Ethelda Chick, MD 02/22/12 [phone]

## 2012-02-23 DIAGNOSIS — N189 Chronic kidney disease, unspecified: Secondary | ICD-10-CM | POA: Diagnosis present

## 2012-02-23 DIAGNOSIS — I129 Hypertensive chronic kidney disease with stage 1 through stage 4 chronic kidney disease, or unspecified chronic kidney disease: Secondary | ICD-10-CM | POA: Diagnosis present

## 2012-02-23 DIAGNOSIS — R000 Tachycardia, unspecified: Secondary | ICD-10-CM | POA: Diagnosis present

## 2012-02-23 DIAGNOSIS — F3110 Bipolar disorder, current episode manic without psychotic features, unspecified: Secondary | ICD-10-CM | POA: Diagnosis present

## 2012-02-23 DIAGNOSIS — E873 Alkalosis: Secondary | ICD-10-CM | POA: Diagnosis present

## 2012-02-23 DIAGNOSIS — N179 Acute kidney failure, unspecified: Secondary | ICD-10-CM | POA: Diagnosis present

## 2012-02-23 DIAGNOSIS — T50901A Poisoning by unspecified drugs, medicaments and biological substances, accidental (unintentional), initial encounter: Secondary | ICD-10-CM | POA: Diagnosis not present

## 2012-02-23 DIAGNOSIS — F17200 Nicotine dependence, unspecified, uncomplicated: Secondary | ICD-10-CM | POA: Diagnosis present

## 2012-02-23 DIAGNOSIS — T43614A Poisoning by caffeine, undetermined, initial encounter: Secondary | ICD-10-CM | POA: Diagnosis present

## 2012-02-23 DIAGNOSIS — R404 Transient alteration of awareness: Secondary | ICD-10-CM | POA: Diagnosis present

## 2012-02-23 LAB — BASIC METABOLIC PANEL
BUN: 30 mg/dL — ABNORMAL HIGH (ref 6–23)
CO2: 24 mEq/L (ref 19–32)
Calcium: 9.9 mg/dL (ref 8.4–10.5)
Chloride: 113 mEq/L — ABNORMAL HIGH (ref 96–112)
Creatinine, Ser: 2.23 mg/dL — ABNORMAL HIGH (ref 0.50–1.10)
GFR calc Af Amer: 28 mL/min — ABNORMAL LOW (ref >90)
GFR calc non Af Amer: 24 mL/min — ABNORMAL LOW (ref >90)
Glucose, Bld: 94 mg/dL (ref 70–99)
Potassium: 4.9 mEq/L (ref 3.5–5.1)
Sodium: 143 mEq/L (ref 135–145)

## 2012-02-23 MED ORDER — DOXEPIN HCL 75 MG PO CAPS: 75.0000 mg | ORAL_CAPSULE | Freq: Every day | ORAL | Status: DC

## 2012-02-23 MED ORDER — LORAZEPAM 2 MG/ML IJ SOLN: 1.0000 mg | INTRAMUSCULAR | Status: DC | PRN
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
## 2012-02-23 NOTE — Progress Notes (Addendum)
Pt refused to sign discharge instructions. Daughter picked up pt at entrance. Patient's daughter is concerned about patient being discharged without any medication sent home with patient.  Informed daughter that her mother does not qualify for assistance medication from hospital per case management and social worker.  Daughter is in a hurry and states she has to go to work.  Daughter refuses to sign discharge instructions also.  Daughter states "my mother can sign her own discharge papers if she is being discharged". Pt assessment has not changed from am. Discharge instructions and prescriptions given to daughter and patient at discharge.

## 2012-02-23 NOTE — Progress Notes (Signed)
Per psych MD, Pt ok to d/c home, when medically ready.  LM for Pt's daughter, Jennifer Lawson.  Spoke with Pt's daughter, Jennifer Lawson.    [name] states that Pt lives with her and that, while she's ok with Pt returning to her care, she doesn't feel that she can continue to have Pt in her home; Pt is too much for her to care for, considering she has a 52-year-old son.  [name] states that Pt's mental health provider is supposed to be working with Pt on securing a place for Pt to live in the community. [name] and CSW discussed Pt going to a [employer].  [name] feels that this would be most appropriate but stated that she is concerned that this will upset her mom.  CSW to leave a list of Family Care Homes in Pt's room for Pt's daughter review.  CSW explained the process of admission into a Family Care Home, including involving Pt's PCP.  Again, Pt's daughter understands that Pt will be d/c'd soon and is agreeable to Pt returning to her home.  CSW thanked [place] for her time.  No further psych CSW needs identified.  Psych CSW to sign off.  Providence Crosby, LCSWA Clinical Social Work [phone]

## 2012-02-23 NOTE — Progress Notes (Signed)
Per psych MD, Pt unable to participate in Ax.  CSW to continue to follow.  Providence Crosby, LCSWA Clinical Social Work [phone]

## 2012-02-23 NOTE — Discharge Summary (Signed)
Physician Discharge Summary  Aquarius Stokley WUJ:[id number] DOB: [birth year] DOA: 02/20/2012  PCP: No primary provider on file.  Admit date: 02/20/2012 Discharge date: 02/23/2012  Recommendations for Outpatient Follow-up:  1. Recommend close follow-up as outpatient c Dr. Julio Sicks and psychiatrist Dr. Jannifer Franklin   2. Recommend followup with a nephrologist of Dr. Vonda Antigua choice  Discharge Diagnoses:  Active Problems:  Drug overdose  Bipolar disorder  Dehydration  ARF (acute renal failure)  CKD (chronic kidney disease)  Tachycardia  HTN (hypertension)   Discharge Condition: Well  Diet recommendation: Heart healthy diet  Filed Weights   02/21/12 0149  Weight: 92.8 kg (204 lb 9.4 oz)    History of present illness:  This 51 year old female with known history of bipolar disease predominant manic, noncompliance tobacco abuse acute kidney injury and [facility] was found on the bathroom of her daughter's house by a neighbor who broke into the house to get her with an empty bottle of hyper feel 9X at her side and vomit on the floor next her-patient was found in the ED tells have an elevated creatinine and the [place] poison control was contacted and recommended supportive management-see below for full history and physical  Hospital Course:  1. Drug overdose-the drug but the patient is on was found to have significant amounts of caffeine and patient was tachycardic however the caffeine-containing medication seemed to wear off during hospital stay. Patient excuse not the left ear is effects from this 2. Acute renal insufficiency-trending down from admission level of 3.0 to today 2.3. She will need outpatient followup and will need a renal panel done as well as potential referral to a nephrologist 3. Bipolar with likely manic phase causing hospitalization-psychiatry has tried to see the patient on admission and she was somewhat catatonic and  would not respond-required  one-to-one sitter during hospital stay-medications were changed slightly as per MAR-she will need close followup with her regular psychiatrist for management as there seemed to be significant mania still during the hospital stay. 4. Hypertension-moderately well controlled-continue clonidine 0.1 mg twice a day, amlodipine 10 mg daily, metoprolol XL 100mg  5. Tobacco abuse-patient needs outpatient counseling 6. ? History diabetes mellitus-blood sugars and his metabolic panel in the 70s-would hold any antiglycemic agents at present time-we will try to coordinate getting an HbA1c as an outpatient 7. Contraction alkalosis-cut back IV fluids 75 cc per hour-this resolved itself  Procedures:  Patient had CT of spine and head done on admission which were negative (i.e. Studies not automatically included, echos, thoracentesis, etc; not x-rays)  Consultations:  Psychiatry  Discharge Exam: Filed Vitals:   02/22/12 1318 02/22/12 2103 02/23/12 0644 02/23/12 1348  BP: 139/89 126/83 131/78 155/102  Pulse: 83 77 89 97  Temp: 98.9 F (37.2 C) 98.9 F (37.2 C) 98.5 F (36.9 C) 99.4 F (37.4 C)  TempSrc: Oral Oral Oral Oral  Resp: 18 20 20    Height:      Weight:      SpO2: 95% 94% 97% 97%    Discharge Instructions  Discharge Orders    Future Orders Please Complete By Expires   Diet - low sodium heart healthy      Diet - low sodium heart healthy      Increase activity slowly      Call MD for:  temperature >100.4      Call MD for:  severe uncontrolled pain      Call MD for:  redness, tenderness, or signs of  infection (pain, swelling, redness, odor or green/yellow discharge around incision site)      Call MD for:  persistant dizziness or light-headedness      Call MD for:  extreme fatigue      Call MD for:  difficulty breathing, headache or visual disturbances      Increase activity slowly      Call MD for:  temperature >100.4      Call MD for:  persistant nausea and  vomiting      Call MD for:  redness, tenderness, or signs of infection (pain, swelling, redness, odor or green/yellow discharge around incision site)      Call MD for:  hives          Medication List     As of 02/23/2012  4:00 PM    STOP taking these medications         QUEtiapine 100 MG tablet   Commonly known as: SEROQUEL      TAKE these medications         amLODipine 10 MG tablet   Commonly known as: NORVASC   Take 10 mg by mouth daily.      buPROPion 150 MG 12 hr tablet   Commonly known as: WELLBUTRIN SR   Take 150 mg by mouth daily.      cloNIDine 0.1 MG tablet   Commonly known as: CATAPRES   Take 0.1 mg by mouth 2 (two) times daily.      divalproex 500 MG 24 hr tablet   Commonly known as: DEPAKOTE ER   Take 1,000 mg by mouth at bedtime.      doxepin 75 MG capsule   Commonly known as: SINEQUAN   Take 1 capsule (75 mg total) by mouth at bedtime.      eszopiclone 1 MG Tabs   Commonly known as: LUNESTA   Take 3 mg by mouth at bedtime. Take immediately before bedtime      furosemide 40 MG tablet   Commonly known as: LASIX   Take 40 mg by mouth daily.      lamoTRIgine 25 MG tablet   Commonly known as: LAMICTAL   Take 25 mg by mouth daily. 1 tablet orally every other day  x14 days then increase to 1 tablet daily x 14 days. rx filled 11/21/12      metoprolol succinate 100 MG 24 hr tablet   Commonly known as: TOPROL-XL   Take 100 mg by mouth daily. Take with or immediately following a meal.      ziprasidone 60 MG capsule   Commonly known as: GEODON   Take 120 mg by mouth at bedtime.      zolpidem 5 MG tablet   Commonly known as: AMBIEN   Take 5 mg by mouth at bedtime.      zolpidem 10 MG tablet   Commonly known as: AMBIEN   Take 10 mg by mouth at bedtime.          The results of significant diagnostics from this hospitalization (including imaging, microbiology, ancillary and laboratory) are listed below for reference.    Significant Diagnostic  Studies: Ct Head Wo Contrast  02/20/2012  *RADIOLOGY REPORT*  Clinical Data: Fall, altered L O C  CT HEAD WITHOUT CONTRAST,CT CERVICAL SPINE WITHOUT CONTRAST  Technique:  Contiguous axial images were obtained from the base of the skull through the vertex without contrast.,Technique: Multidetector CT imaging of the cervical spine was performed. Multiplanar CT image reconstructions were also generated.  Comparison: None.  Findings: No skull fracture is noted.  The paranasal sinuses and mastoid air cells are unremarkable.  No intracranial hemorrhage, mass effect or midline shift.  No mass lesion is noted on this unenhanced scan.  The gray and white matter differentiation is preserved.  IMPRESSION:  No acute intracranial abnormality.  CT cervical spine without IV contrast.  Findings:  Axial images of the cervical spine shows no acute fracture or subluxation.  Computer processed images shows no acute fracture or subluxation. There is mild anterior spurring at C4-C5 level.  Mild disc space flattening with mild anterior and mild posterior spurring noted at C5-C6 and C6-C7 level.  Mild disc space flattening at C 7 T1 level. No prevertebral soft tissue swelling.  Cervical airway is patent.  Impression: 1.  No acute fracture or subluxation.  Mild degenerative changes as described above.   Original Report Authenticated By: Natasha Mead, M.D.    Ct Cervical Spine Wo Contrast  02/20/2012  *RADIOLOGY REPORT*  Clinical Data: Fall, altered L O C  CT HEAD WITHOUT CONTRAST,CT CERVICAL SPINE WITHOUT CONTRAST  Technique:  Contiguous axial images were obtained from the base of the skull through the vertex without contrast.,Technique: Multidetector CT imaging of the cervical spine was performed. Multiplanar CT image reconstructions were also generated.  Comparison: None.  Findings: No skull fracture is noted.  The paranasal sinuses and mastoid air cells are unremarkable.  No intracranial hemorrhage, mass effect or midline shift.  No  mass lesion is noted on this unenhanced scan.  The gray and white matter differentiation is preserved.  IMPRESSION:  No acute intracranial abnormality.  CT cervical spine without IV contrast.  Findings:  Axial images of the cervical spine shows no acute fracture or subluxation.  Computer processed images shows no acute fracture or subluxation. There is mild anterior spurring at C4-C5 level.  Mild disc space flattening with mild anterior and mild posterior spurring noted at C5-C6 and C6-C7 level.  Mild disc space flattening at C 7 T1 level. No prevertebral soft tissue swelling.  Cervical airway is patent.  Impression: 1.  No acute fracture or subluxation.  Mild degenerative changes as described above.   Original Report Authenticated By: Natasha Mead, M.D.     Microbiology: No results found for this or any previous visit (from the past 240 hour(s)).   Labs: Basic Metabolic Panel:  Lab 02/23/12 1610 02/22/12 0433 02/21/12 0444 02/20/12 1535 02/20/12 1115  NA 143 146* 144 -- 138  K 4.9 4.7 4.9 4.6 5.3*  CL 113* 114* 109 -- 100  CO2 24 23 21  -- 23  GLUCOSE 94 77 74 -- 78  BUN 30* 34* 39* -- 42*  CREATININE 2.23* 2.35* 2.80* -- 3.03*  CALCIUM 9.9 10.3 10.7* -- 11.9*  MG -- -- -- -- --  PHOS -- -- -- -- --   Liver Function Tests:  Lab 02/21/12 0444 02/20/12 1115  AST 16 28  ALT 32 45*  ALKPHOS 78 98  BILITOT 0.3 0.2*  PROT 7.6 8.7*  ALBUMIN 3.3* 4.1   No results found for this basename: LIPASE:5,AMYLASE:5 in the last 168 hours No results found for this basename: AMMONIA:5 in the last 168 hours CBC:  Lab 02/21/12 0444 02/20/12 1115  WBC 6.2 9.5  NEUTROABS -- 7.7  HGB 11.5* 12.9  HCT 36.6 39.1  MCV 92.9 91.6  PLT 308 345   Cardiac Enzymes:  Lab 02/21/12 0444 02/20/12 2235  CKTOTAL -- --  CKMB -- --  CKMBINDEX -- --  TROPONINI <0.30 <0.30   BNP: BNP (last 3 results) No results found for this basename: PROBNP:3 in the last 8760 hours CBG:  Lab 02/20/12 1446  GLUCAP 144*     Time coordinating discharge: 40 minutes  Signed:  Rhetta Mura  [facility] 02/23/2012, 4:00 PM

## 2012-02-23 NOTE — Progress Notes (Signed)
Progress  F/U Consultation Patient Identification:  Jennifer Lawson Date of Evaluation:  02/23/2012 Reason for Consult:  Pt was found unconscious in bathroom  Empty energy was found near her  Referring Provider: Dr. Irene Limbo  History of Present Illness: Pt is awake and alert.  She tells a consistent story of her daughter and 52 yo grandson had gone to [place] to celebrate his birthday.  She "was cleaning the bathroom."  Two days ago, she was awake and catatonic, staring without saying anything.  Yesterday, she was awake, alert. She was unable to focus on anything except asking why was she 'here' and she wanted to call her daughter.  She was unable to engage in conversation; was perseverating.   Past Psychiatric History: She says she has been treated by her physician.  She knows her doctor's name and can name her medication: Geodon 60 mg 2 tabs after eating a meal.   Past Medical History:     Past Medical History  Diagnosis Date  . Mental disorder   . Depression   . Hypertension   . Diabetes mellitus without complication       History reviewed. No pertinent past surgical history.  Allergies:  Allergies  Allergen Reactions  . Codeine Sulfate Anaphylaxis    Daughter called about having this allergy     Current Medications:  Prior to Admission medications   Medication Sig Start Date End Date Taking? Authorizing Provider  buPROPion (WELLBUTRIN SR) 150 MG 12 hr tablet Take 150 mg by mouth daily.   Yes Historical Provider, MD  cloNIDine (CATAPRES) 0.1 MG tablet Take 0.1 mg by mouth 2 (two) times daily.   Yes Historical Provider, MD  divalproex (DEPAKOTE ER) 500 MG 24 hr tablet Take 1,000 mg by mouth at bedtime.   Yes Historical Provider, MD  furosemide (LASIX) 40 MG tablet Take 40 mg by mouth daily.   Yes Historical Provider, MD  lamoTRIgine (LAMICTAL) 25 MG tablet Take 25 mg by mouth daily. 1 tablet orally every other day  x14 days then increase to 1 tablet daily x 14 days. rx filled  11/21/12   Yes Historical Provider, MD  metoprolol succinate (TOPROL-XL) 100 MG 24 hr tablet Take 100 mg by mouth daily. Take with or immediately following a meal.   Yes Historical Provider, MD  ziprasidone (GEODON) 60 MG capsule Take 120 mg by mouth at bedtime.   Yes Historical Provider, MD  zolpidem (AMBIEN) 10 MG tablet Take 10 mg by mouth at bedtime.   Yes Historical Provider, MD  amLODipine (NORVASC) 10 MG tablet Take 10 mg by mouth daily.    Historical Provider, MD  doxepin (SINEQUAN) 75 MG capsule Take 1 capsule (75 mg total) by mouth at bedtime. 02/23/12   Rhetta Mura, MD  eszopiclone (LUNESTA) 1 MG TABS Take 3 mg by mouth at bedtime. Take immediately before bedtime    Historical Provider, MD  zolpidem (AMBIEN) 5 MG tablet Take 5 mg by mouth at bedtime. 08/31/11 08/30/12  Celene Kras, MD    Social History:    reports that she has been smoking.  She does not have any smokeless tobacco history on file. She reports that she drinks alcohol. She reports that she does not use illicit drugs.   Family History:    History reviewed. No pertinent family history.  Mental Status Examination/Evaluation: Objective:  Appearance: Refreshed after morning shower  Psychomotor Activity:  Normal  Eye Contact::  Good  Speech:  Clear and Coherent, Normal Rate and  Demonstrates ability to stay on topic and provide pertinent information  Volume:  Normal  Mood:  Anxious  Affect:  Appropriate and Very concerned about getting in touch with her daughter  Thought Process:  Coherent, Relevant, Intact and Remains mystified about her reason for coming to the hospital  Orientation:  Full  Thought Content:  She is very focused on getting in touch with her daughter returning to her daughter's home. She says she takes care of her grandson   Suicidal Thoughts:  No  Homicidal Thoughts:  No  Judgement:  Good  Insight:  Fair    DIAGNOSIS:   AXIS I   Bipolar disorder  II     AXIS II  Deffered  AXIS III See  medical notes.  AXIS IV economic problems, housing problems, other psychosocial or environmental problems, problems related to social environment, problems with primary support group and questions are raised white daughter continues to refuse to talk with her or her staff.  AXIS V 51-60 moderate symptoms   Assessment/Plan:  Discussed with Dr. Irene Limbo, Psych CSW Patient is awake and aware, her hair is combed but not styled.   She has good eye contact. Her greetings and responses are appropriate.  She provides information regarding her doctors, her medications and states that he she is completely mystified why she was brought to the hospital. She claims that the empty can of energy drink comes from the health food store. It is one that her daughter drinks often. It is believed that she tried the energy drink herself remembers nothing more. She said that she had lived in an other town and moved to live with her daughter. She claims she is living there to watch her grandson while her daughter attends college. (No other explanation is offered e.g. What she evicted or had no income to pay for rent.)  She explained that her daughter and son left for [place] and while they were gone, she wanted to clean up the house for their return.    She says nothing to explain the behavior of her daughter when she calls her. Her daughter has consistently hung up on her and hung up on the staff members. Also, Dr. Irene Limbo attempted to call her and she hung up. Without speaking with her we don't know the reason for her unwillingness to talk with anyone. Dr. Irene Limbo is thinking about discharging her today because she has demonstrated she is cognitively intact.. RECOMMENDATION: 1.  Patient is cognitively intact and is anticipating return to her home shared with her daughter and grandson. 2.  Patient is taking her Geodon after meals but has split meal half-and-half so she can separate the ingestion of pills. She is encouraged  to eat her meal and take her 2 tablets following.   3.  She is referred to her existing physician and psychiatrist.  4.  No further psychiatric needs are identified. M.D. Psychiatrist signs off.  Varshini Arrants J. Ferol Luz, MD Psychiatrist  02/23/2012 3:03 PM

## 2012-02-24 DIAGNOSIS — R609 Edema, unspecified: Secondary | ICD-10-CM | POA: Diagnosis not present

## 2012-02-24 DIAGNOSIS — I10 Essential (primary) hypertension: Secondary | ICD-10-CM | POA: Diagnosis not present

## 2012-02-24 DIAGNOSIS — N390 Urinary tract infection, site not specified: Secondary | ICD-10-CM | POA: Diagnosis present

## 2012-02-24 DIAGNOSIS — G4700 Insomnia, unspecified: Secondary | ICD-10-CM | POA: Diagnosis present

## 2012-02-24 DIAGNOSIS — F319 Bipolar disorder, unspecified: Secondary | ICD-10-CM | POA: Diagnosis not present

## 2012-02-24 DIAGNOSIS — N179 Acute kidney failure, unspecified: Secondary | ICD-10-CM | POA: Diagnosis not present

## 2012-02-24 DIAGNOSIS — F259 Schizoaffective disorder, unspecified: Secondary | ICD-10-CM | POA: Diagnosis present

## 2012-02-24 DIAGNOSIS — F3189 Other bipolar disorder: Secondary | ICD-10-CM | POA: Diagnosis not present

## 2012-02-24 DIAGNOSIS — E669 Obesity, unspecified: Secondary | ICD-10-CM | POA: Diagnosis present

## 2012-02-24 DIAGNOSIS — F29 Unspecified psychosis not due to a substance or known physiological condition: Secondary | ICD-10-CM | POA: Diagnosis present

## 2012-02-24 DIAGNOSIS — E785 Hyperlipidemia, unspecified: Secondary | ICD-10-CM | POA: Diagnosis present

## 2012-02-24 NOTE — Progress Notes (Signed)
Patient's daughter called sounding very upset voicing her concerns that her mother was discharged yesterday when clearly to her she is still needing to be hospitalized for psychiatric issues, and did not feel she received intructions cause her mother did not have any with her when discharged.  States her mother has been awake all night, yelling, combative, has recently broke plate glass door, refusing to take her medication. She verbalized her need for someone or place to care for her mother since she feels she is a danger in her home to herself and her small child.  I could hear a female yelling and throwing things over our phone conversation, advised daughter to call 911 for police assistance in managing her mothers physical combativeness.

## 2012-03-21 DIAGNOSIS — F259 Schizoaffective disorder, unspecified: Secondary | ICD-10-CM | POA: Diagnosis not present

## 2012-04-16 DIAGNOSIS — F4310 Post-traumatic stress disorder, unspecified: Secondary | ICD-10-CM | POA: Diagnosis not present

## 2012-04-16 DIAGNOSIS — F3132 Bipolar disorder, current episode depressed, moderate: Secondary | ICD-10-CM | POA: Diagnosis not present

## 2012-04-26 DIAGNOSIS — E559 Vitamin D deficiency, unspecified: Secondary | ICD-10-CM | POA: Diagnosis not present

## 2012-04-26 DIAGNOSIS — I10 Essential (primary) hypertension: Secondary | ICD-10-CM | POA: Diagnosis not present

## 2012-04-26 DIAGNOSIS — N183 Chronic kidney disease, stage 3 unspecified: Secondary | ICD-10-CM | POA: Diagnosis not present

## 2012-04-26 DIAGNOSIS — E785 Hyperlipidemia, unspecified: Secondary | ICD-10-CM | POA: Diagnosis not present

## 2012-04-26 DIAGNOSIS — F319 Bipolar disorder, unspecified: Secondary | ICD-10-CM | POA: Diagnosis not present

## 2012-04-26 DIAGNOSIS — R0989 Other specified symptoms and signs involving the circulatory and respiratory systems: Secondary | ICD-10-CM | POA: Diagnosis not present

## 2012-04-26 DIAGNOSIS — I509 Heart failure, unspecified: Secondary | ICD-10-CM | POA: Diagnosis not present

## 2012-04-26 DIAGNOSIS — E538 Deficiency of other specified B group vitamins: Secondary | ICD-10-CM | POA: Diagnosis not present

## 2012-04-26 DIAGNOSIS — R0609 Other forms of dyspnea: Secondary | ICD-10-CM | POA: Diagnosis not present

## 2012-04-26 DIAGNOSIS — F17200 Nicotine dependence, unspecified, uncomplicated: Secondary | ICD-10-CM | POA: Diagnosis not present

## 2012-05-03 DIAGNOSIS — F3132 Bipolar disorder, current episode depressed, moderate: Secondary | ICD-10-CM | POA: Diagnosis not present

## 2012-05-03 DIAGNOSIS — F29 Unspecified psychosis not due to a substance or known physiological condition: Secondary | ICD-10-CM | POA: Diagnosis not present

## 2012-05-03 DIAGNOSIS — F4310 Post-traumatic stress disorder, unspecified: Secondary | ICD-10-CM | POA: Diagnosis not present

## 2012-05-31 DIAGNOSIS — F4310 Post-traumatic stress disorder, unspecified: Secondary | ICD-10-CM | POA: Diagnosis not present

## 2012-05-31 DIAGNOSIS — F29 Unspecified psychosis not due to a substance or known physiological condition: Secondary | ICD-10-CM | POA: Diagnosis not present

## 2012-05-31 DIAGNOSIS — F3132 Bipolar disorder, current episode depressed, moderate: Secondary | ICD-10-CM | POA: Diagnosis not present

## 2012-07-17 DIAGNOSIS — F29 Unspecified psychosis not due to a substance or known physiological condition: Secondary | ICD-10-CM | POA: Diagnosis not present

## 2012-07-17 DIAGNOSIS — F913 Oppositional defiant disorder: Secondary | ICD-10-CM | POA: Diagnosis not present

## 2012-07-17 DIAGNOSIS — F3132 Bipolar disorder, current episode depressed, moderate: Secondary | ICD-10-CM | POA: Diagnosis not present

## 2012-07-29 ENCOUNTER — Inpatient Hospital Stay (HOSPITAL_COMMUNITY)
Admission: EM | Admit: 2012-07-29 | Discharge: 2012-08-01 | DRG: 917 | Disposition: A | Discharge status: Home / Self Care | Payer: Medicare Other | Attending: Internal Medicine | Admitting: Internal Medicine

## 2012-07-29 ENCOUNTER — Emergency Department (HOSPITAL_COMMUNITY): Payer: Medicare Other

## 2012-07-29 ENCOUNTER — Encounter (HOSPITAL_COMMUNITY): Payer: Self-pay

## 2012-07-29 DIAGNOSIS — N189 Chronic kidney disease, unspecified: Secondary | ICD-10-CM | POA: Diagnosis not present

## 2012-07-29 DIAGNOSIS — T43502A Poisoning by unspecified antipsychotics and neuroleptics, intentional self-harm, initial encounter: Secondary | ICD-10-CM | POA: Diagnosis present

## 2012-07-29 DIAGNOSIS — Z5189 Encounter for other specified aftercare: Secondary | ICD-10-CM | POA: Diagnosis not present

## 2012-07-29 DIAGNOSIS — F17200 Nicotine dependence, unspecified, uncomplicated: Secondary | ICD-10-CM | POA: Diagnosis present

## 2012-07-29 DIAGNOSIS — I440 Atrioventricular block, first degree: Secondary | ICD-10-CM | POA: Diagnosis present

## 2012-07-29 DIAGNOSIS — N184 Chronic kidney disease, stage 4 (severe): Secondary | ICD-10-CM | POA: Diagnosis present

## 2012-07-29 DIAGNOSIS — N179 Acute kidney failure, unspecified: Secondary | ICD-10-CM | POA: Diagnosis present

## 2012-07-29 DIAGNOSIS — T43501A Poisoning by unspecified antipsychotics and neuroleptics, accidental (unintentional), initial encounter: Secondary | ICD-10-CM | POA: Diagnosis present

## 2012-07-29 DIAGNOSIS — F4310 Post-traumatic stress disorder, unspecified: Secondary | ICD-10-CM | POA: Diagnosis present

## 2012-07-29 DIAGNOSIS — T426X1A Poisoning by other antiepileptic and sedative-hypnotic drugs, accidental (unintentional), initial encounter: Secondary | ICD-10-CM | POA: Diagnosis present

## 2012-07-29 DIAGNOSIS — T4272XA Poisoning by unspecified antiepileptic and sedative-hypnotic drugs, intentional self-harm, initial encounter: Secondary | ICD-10-CM | POA: Diagnosis present

## 2012-07-29 DIAGNOSIS — S0993XA Unspecified injury of face, initial encounter: Secondary | ICD-10-CM | POA: Diagnosis not present

## 2012-07-29 DIAGNOSIS — F319 Bipolar disorder, unspecified: Secondary | ICD-10-CM | POA: Diagnosis present

## 2012-07-29 DIAGNOSIS — E119 Type 2 diabetes mellitus without complications: Secondary | ICD-10-CM | POA: Diagnosis present

## 2012-07-29 DIAGNOSIS — I129 Hypertensive chronic kidney disease with stage 1 through stage 4 chronic kidney disease, or unspecified chronic kidney disease: Secondary | ICD-10-CM | POA: Diagnosis present

## 2012-07-29 DIAGNOSIS — T50992A Poisoning by other drugs, medicaments and biological substances, intentional self-harm, initial encounter: Secondary | ICD-10-CM | POA: Diagnosis present

## 2012-07-29 DIAGNOSIS — T465X1A Poisoning by other antihypertensive drugs, accidental (unintentional), initial encounter: Secondary | ICD-10-CM | POA: Diagnosis present

## 2012-07-29 DIAGNOSIS — T43294A Poisoning by other antidepressants, undetermined, initial encounter: Secondary | ICD-10-CM | POA: Diagnosis present

## 2012-07-29 DIAGNOSIS — N039 Chronic nephritic syndrome with unspecified morphologic changes: Secondary | ICD-10-CM | POA: Diagnosis present

## 2012-07-29 DIAGNOSIS — T438X2A Poisoning by other psychotropic drugs, intentional self-harm, initial encounter: Secondary | ICD-10-CM | POA: Diagnosis present

## 2012-07-29 DIAGNOSIS — R578 Other shock: Secondary | ICD-10-CM | POA: Diagnosis not present

## 2012-07-29 DIAGNOSIS — I10 Essential (primary) hypertension: Secondary | ICD-10-CM

## 2012-07-29 DIAGNOSIS — G9349 Other encephalopathy: Secondary | ICD-10-CM | POA: Diagnosis present

## 2012-07-29 DIAGNOSIS — R4182 Altered mental status, unspecified: Secondary | ICD-10-CM | POA: Diagnosis not present

## 2012-07-29 DIAGNOSIS — S0990XA Unspecified injury of head, initial encounter: Secondary | ICD-10-CM | POA: Diagnosis not present

## 2012-07-29 DIAGNOSIS — S199XXA Unspecified injury of neck, initial encounter: Secondary | ICD-10-CM | POA: Diagnosis not present

## 2012-07-29 DIAGNOSIS — R0602 Shortness of breath: Secondary | ICD-10-CM | POA: Diagnosis not present

## 2012-07-29 DIAGNOSIS — T426X2A Poisoning by other antiepileptic and sedative-hypnotic drugs, intentional self-harm, initial encounter: Secondary | ICD-10-CM | POA: Diagnosis present

## 2012-07-29 DIAGNOSIS — T4271XA Poisoning by unspecified antiepileptic and sedative-hypnotic drugs, accidental (unintentional), initial encounter: Secondary | ICD-10-CM | POA: Diagnosis present

## 2012-07-29 DIAGNOSIS — T50991A Poisoning by other drugs, medicaments and biological substances, accidental (unintentional), initial encounter: Secondary | ICD-10-CM | POA: Diagnosis not present

## 2012-07-29 DIAGNOSIS — D631 Anemia in chronic kidney disease: Secondary | ICD-10-CM | POA: Diagnosis present

## 2012-07-29 DIAGNOSIS — T50901A Poisoning by unspecified drugs, medicaments and biological substances, accidental (unintentional), initial encounter: Secondary | ICD-10-CM | POA: Diagnosis not present

## 2012-07-29 HISTORY — DX: Poisoning by unspecified drugs, medicaments and biological substances, accidental (unintentional), initial encounter: T50.901A

## 2012-07-29 LAB — COMPREHENSIVE METABOLIC PANEL
ALT: 7 U/L (ref 0–35)
AST: 13 U/L (ref 0–37)
Albumin: 3.1 g/dL — ABNORMAL LOW (ref 3.5–5.2)
Alkaline Phosphatase: 56 U/L (ref 39–117)
BUN: 25 mg/dL — ABNORMAL HIGH (ref 6–23)
CO2: 25 mEq/L (ref 19–32)
Calcium: 10.2 mg/dL (ref 8.4–10.5)
Chloride: 103 mEq/L (ref 96–112)
Creatinine, Ser: 2.7 mg/dL — ABNORMAL HIGH (ref 0.50–1.10)
GFR calc Af Amer: 22 mL/min — ABNORMAL LOW (ref >90)
GFR calc non Af Amer: 19 mL/min — ABNORMAL LOW (ref >90)
Glucose, Bld: 87 mg/dL (ref 70–99)
Potassium: 4.2 mEq/L (ref 3.5–5.1)
Sodium: 139 mEq/L (ref 135–145)
Total Bilirubin: 0.1 mg/dL — ABNORMAL LOW (ref 0.3–1.2)
Total Protein: 7.6 g/dL (ref 6.0–8.3)

## 2012-07-29 LAB — CBC
HCT: 32.4 % — ABNORMAL LOW (ref 36.0–46.0)
Hemoglobin: 10.2 g/dL — ABNORMAL LOW (ref 12.0–15.0)
MCH: 29.5 pg (ref 26.0–34.0)
MCHC: 31.5 g/dL (ref 30.0–36.0)
MCV: 93.6 fL (ref 78.0–100.0)
Platelets: 231 10*3/uL (ref 150–400)
RBC: 3.46 MIL/uL — ABNORMAL LOW (ref 3.87–5.11)
RDW: 15.6 % — ABNORMAL HIGH (ref 11.5–15.5)
WBC: 6.7 10*3/uL (ref 4.0–10.5)

## 2012-07-29 LAB — URINALYSIS, MICROSCOPIC ONLY
Bilirubin Urine: NEGATIVE
Glucose, UA: NEGATIVE mg/dL
Hgb urine dipstick: NEGATIVE
Ketones, ur: NEGATIVE mg/dL
Leukocytes, UA: NEGATIVE
Nitrite: NEGATIVE
Protein, ur: NEGATIVE mg/dL
Specific Gravity, Urine: 1.009 (ref 1.005–1.030)
Urine-Other: NONE SEEN
Urobilinogen, UA: 0.2 mg/dL (ref 0.0–1.0)
pH: 7 (ref 5.0–8.0)

## 2012-07-29 LAB — SALICYLATE LEVEL: Salicylate Lvl: <2 mg/dL — ABNORMAL LOW (ref 2.8–20.0)

## 2012-07-29 LAB — RAPID URINE DRUG SCREEN, HOSP PERFORMED
Amphetamines: NOT DETECTED
Barbiturates: NOT DETECTED
Benzodiazepines: NOT DETECTED
Cocaine: NOT DETECTED
Opiates: NOT DETECTED
Tetrahydrocannabinol: NOT DETECTED

## 2012-07-29 LAB — ACETAMINOPHEN LEVEL: Acetaminophen (Tylenol), Serum: <15 ug/mL (ref 10–30)

## 2012-07-29 LAB — GLUCOSE, CAPILLARY: Glucose-Capillary: 80 mg/dL (ref 70–99)

## 2012-07-29 LAB — ETHANOL: Alcohol, Ethyl (B): <11 mg/dL (ref 0–11)

## 2012-07-29 LAB — VALPROIC ACID LEVEL: Valproic Acid Lvl: 66.9 ug/mL (ref 50.0–100.0)

## 2012-07-29 LAB — LIPASE, BLOOD: Lipase: 43 U/L (ref 11–59)

## 2012-07-29 MED ORDER — SODIUM CHLORIDE 0.9 % IV BOLUS (SEPSIS)
1000.0000 mL | Freq: Once | INTRAVENOUS | Status: AC
  Administered 2012-07-29: 1000 mL via INTRAVENOUS

## 2012-07-29 MED ORDER — NALOXONE HCL 0.4 MG/ML IJ SOLN
0.4000 mg | Freq: Once | INTRAMUSCULAR | Status: AC
  Administered 2012-07-29: 0.4 mg via INTRAVENOUS
  Filled 2012-07-29: qty 1

## 2012-07-29 NOTE — ED Notes (Signed)
Per EMS, daughter called EMS for patient taking a weeks worth of meds at home.  Meds were her meds.  Contained cardiac/behavioral meds (ambien, depakote, lasix).  Pt with hx of same.  She and dtr are fighting when pt took meds.  Vitals:  cbg 90, bp 137/114, hr 76-80, resp 14-25, sats ra 92%, placed on 2l per [place].  LAC 20 g started in route.  Pt is alert and able to speak.  Orientation is questionable.

## 2012-07-29 NOTE — ED Notes (Signed)
Removed c-collar.  Neck/head clear.  MD ok.

## 2012-07-29 NOTE — ED Notes (Signed)
MD at bedside. 

## 2012-07-29 NOTE — ED Notes (Signed)
Pt transported to CT scan.

## 2012-07-29 NOTE — ED Provider Notes (Signed)
History     CSN: [id number]  Arrival date & time 07/29/12  2012   First MD Initiated Contact with Patient 07/29/12 2025      Chief Complaint  Patient presents with  . Drug Overdose    (Consider location/radiation/quality/duration/timing/severity/associated sxs/prior treatment) HPI..... level V caveat for urgent need for intervention and serious illness.   Patient allegedly took a overdose of multiple medications. Her daughter states that she took one week's supply of all her medications. This is unsubstantiated. She is quite obtunded and unable to give a history. Patient arrived by EMS.  Past Medical History  Diagnosis Date  . Mental disorder   . Depression   . Hypertension   . Diabetes mellitus without complication   . Overdose     History reviewed. No pertinent past surgical history.  History reviewed. No pertinent family history.  History  Substance Use Topics  . Smoking status: Current Every Day Smoker  . Smokeless tobacco: Not on file  . Alcohol Use: Yes    OB History   Grav Para Term Preterm Abortions TAB SAB Ect Mult Living                  Review of Systems  Unable to perform ROS: Acuity of condition    Allergies  Codeine sulfate  Home Medications   Current Outpatient Rx  Name  Route  Sig  Dispense  Refill  . amLODipine (NORVASC) 10 MG tablet   Oral   Take 10 mg by mouth daily.         . busPIRone (BUSPAR) 10 MG tablet   Oral   Take 10 mg by mouth 3 (three) times daily.         . cloNIDine (CATAPRES) 0.1 MG tablet   Oral   Take 0.1 mg by mouth 2 (two) times daily.         . divalproex (DEPAKOTE) 500 MG DR tablet   Oral   Take 500 mg by mouth 2 (two) times daily.         . furosemide (LASIX) 40 MG tablet   Oral   Take 40 mg by mouth daily.         . hydrOXYzine (VISTARIL) 50 MG capsule   Oral   Take 50 mg by mouth 3 (three) times daily as needed for anxiety.         . metoprolol succinate (TOPROL-XL) 100 MG 24 hr  tablet   Oral   Take 100 mg by mouth daily. Take with or immediately following a meal.         . mirtazapine (REMERON) 15 MG tablet   Oral   Take 15 mg by mouth at bedtime. At 9pm         . zolpidem (AMBIEN) 10 MG tablet   Oral   Take 10 mg by mouth at bedtime as needed for sleep.         Marland Kitchen buPROPion (WELLBUTRIN SR) 150 MG 12 hr tablet   Oral   Take 150 mg by mouth daily.         Marland Kitchen doxepin (SINEQUAN) 75 MG capsule   Oral   Take 1 capsule (75 mg total) by mouth at bedtime.   30 capsule   0   . eszopiclone (LUNESTA) 1 MG TABS   Oral   Take 3 mg by mouth at bedtime. Take immediately before bedtime         . lamoTRIgine (LAMICTAL) 25 MG tablet  Oral   Take 25 mg by mouth daily. 1 tablet orally every other day  x14 days then increase to 1 tablet daily x 14 days. rx filled 11/21/12         . ziprasidone (GEODON) 60 MG capsule   Oral   Take 120 mg by mouth at bedtime.           BP 103/71  Pulse 71  Temp(Src) 97.9 F (36.6 C) (Axillary)  Resp 17  SpO2 97%  Physical Exam  Nursing note and vitals reviewed. Constitutional:  Patient is arousable with sternal rub.  She mumbles answers to questions  HENT:  Head: Normocephalic and atraumatic.  Eyes: Conjunctivae and EOM are normal. Pupils are equal, round, and reactive to light.  Neck: Normal range of motion. Neck supple.  Cardiovascular: Normal rate, regular rhythm and normal heart sounds.   Pulmonary/Chest: Effort normal and breath sounds normal.  Abdominal: Soft. Bowel sounds are normal.  Musculoskeletal:  Unable  Neurological:  Unable  Skin: Skin is warm and dry.  Psychiatric:  Obtunded    ED Course  Procedures (including critical care time)  Labs Reviewed  CBC - Abnormal; Notable for the following:    RBC 3.46 (*)    Hemoglobin 10.2 (*)    HCT 32.4 (*)    RDW 15.6 (*)    All other components within normal limits  COMPREHENSIVE METABOLIC PANEL - Abnormal; Notable for the following:    BUN  25 (*)    Creatinine, Ser 2.70 (*)    Albumin 3.1 (*)    Total Bilirubin 0.1 (*)    GFR calc non Af Amer 19 (*)    GFR calc Af Amer 22 (*)    All other components within normal limits  SALICYLATE LEVEL - Abnormal; Notable for the following:    Salicylate Lvl <2.0 (*)    All other components within normal limits  ETHANOL  ACETAMINOPHEN LEVEL  URINE RAPID DRUG SCREEN (HOSP PERFORMED)  VALPROIC ACID LEVEL  URINALYSIS, MICROSCOPIC ONLY  LIPASE, BLOOD  GLUCOSE, CAPILLARY   Ct Head Wo Contrast  07/29/2012  *RADIOLOGY REPORT*  Clinical Data:  Fall. overdose.  No unresponsive patient.  CT HEAD WITHOUT CONTRAST CT CERVICAL SPINE WITHOUT CONTRAST  Technique:  Multidetector CT imaging of the head and cervical spine was performed following the standard protocol without intravenous contrast.  Multiplanar CT image reconstructions of the cervical spine were also generated.  Comparison:  02/20/2012.  CT HEAD  Findings: No mass lesion, mass effect, midline shift, hydrocephalus, hemorrhage.  No territorial ischemia or acute infarction.  Calvarium intact.  Paranasal sinuses demonstrate mucous retention cyst or polyp in the posterior aspect of the left maxillary sinus.  Mastoid air cells are clear.  IMPRESSION: Negative CT head.  CT CERVICAL SPINE  Findings: Mild cervical spondylosis is present.  There is no cervical spine fracture or dislocation.  There is mild motion artifact which degrades the study, particularly on the reconstructed sagittal images.  Dextroconvex torticollis is present.  Thyromegaly, most compatible with goiter. The dependent atelectasis in the lung apices.  Mastoid air cells are clear.  Craniocervical alignment and occipital condyles appear normal.  The odontoid intact.  Prevertebral soft tissues appear within normal limits.  The 2 mm retrolisthesis of C4 on C5 appears degenerative.  Some of this may also be accounted for by motion artifact.  C5-C6 and C6-C7 predominant cervical spondylosis with  bilateral uncovertebral spurring and foraminal encroachment. Patulous esophagus.  Carotid atherosclerosis.  IMPRESSION: No  acute osseous abnormality.  No cervical spine fracture or dislocation.  Study mildly degraded by motion artifact.   Original Report Authenticated By: Andreas Newport, M.D.    Ct Cervical Spine Wo Contrast  07/29/2012  *RADIOLOGY REPORT*  Clinical Data:  Fall. overdose.  No unresponsive patient.  CT HEAD WITHOUT CONTRAST CT CERVICAL SPINE WITHOUT CONTRAST  Technique:  Multidetector CT imaging of the head and cervical spine was performed following the standard protocol without intravenous contrast.  Multiplanar CT image reconstructions of the cervical spine were also generated.  Comparison:  02/20/2012.  CT HEAD  Findings: No mass lesion, mass effect, midline shift, hydrocephalus, hemorrhage.  No territorial ischemia or acute infarction.  Calvarium intact.  Paranasal sinuses demonstrate mucous retention cyst or polyp in the posterior aspect of the left maxillary sinus.  Mastoid air cells are clear.  IMPRESSION: Negative CT head.  CT CERVICAL SPINE  Findings: Mild cervical spondylosis is present.  There is no cervical spine fracture or dislocation.  There is mild motion artifact which degrades the study, particularly on the reconstructed sagittal images.  Dextroconvex torticollis is present.  Thyromegaly, most compatible with goiter. The dependent atelectasis in the lung apices.  Mastoid air cells are clear.  Craniocervical alignment and occipital condyles appear normal.  The odontoid intact.  Prevertebral soft tissues appear within normal limits.  The 2 mm retrolisthesis of C4 on C5 appears degenerative.  Some of this may also be accounted for by motion artifact.  C5-C6 and C6-C7 predominant cervical spondylosis with bilateral uncovertebral spurring and foraminal encroachment. Patulous esophagus.  Carotid atherosclerosis.  IMPRESSION: No acute osseous abnormality.  No cervical spine fracture or  dislocation.  Study mildly degraded by motion artifact.   Original Report Authenticated By: Andreas Newport, M.D.      1. Drug overdose, initial encounter   CRITICAL CARE Performed by: Donnetta Hutching   Total critical care time: 30  Critical care time was exclusive of separately billable procedures and treating other patients.  Critical care was necessary to treat or prevent imminent or life-threatening deterioration.  Critical care was time spent personally by me on the following activities: development of treatment plan with patient and/or surrogate as well as nursing, discussions with consultants, evaluation of patient's response to treatment, examination of patient, obtaining history from patient or surrogate, ordering and performing treatments and interventions, ordering and review of laboratory studies, ordering and review of radiographic studies, pulse oximetry and re-evaluation of patient's condition.   MDM   Patient allegedly took an overdose of her medication.  She is obtunded, but arousable.  Vital signs remained stable. Good oxygenation.  Multiple rechecks.  Discussed with hospitalist. Admit        Donnetta Hutching, MD 07/29/12 [phone]

## 2012-07-29 NOTE — ED Notes (Signed)
Guidelines from poison control faxed and in chart.

## 2012-07-29 NOTE — ED Notes (Signed)
Per EMS, pt fell after taking meds and hitting head.  Pt arrived on back board and c-collar.  Back board to be removed by staff.  c-collar will remain.

## 2012-07-29 NOTE — ED Notes (Signed)
ZOX:WRUE<AV> Expected date:<BR> Expected time:<BR> Means of arrival:<BR> Comments:<BR> EMS/overdose

## 2012-07-29 NOTE — ED Notes (Signed)
Spoke with Jennifer Lawson from [employer].  Cardiac monitoring-watch for hypotension-treat with dopamine, bradycardia, treat with atropine.  EKG, continuous monitoring, IVF's, glucagon as needed, pressors as needed.  Treat symptoms as they arise.  Supportive care.  This Clinical research associate also found advil pm in a pill bottle-may have tachycardia and hypertension if she took the advil pm.  Check depakote level/repeat every 6-8 hours x 2 times-need peak and a fall off.

## 2012-07-30 ENCOUNTER — Encounter (HOSPITAL_COMMUNITY): Payer: Self-pay | Admitting: *Deleted

## 2012-07-30 ENCOUNTER — Emergency Department (HOSPITAL_COMMUNITY): Payer: Medicare Other

## 2012-07-30 DIAGNOSIS — R0602 Shortness of breath: Secondary | ICD-10-CM | POA: Diagnosis not present

## 2012-07-30 DIAGNOSIS — N189 Chronic kidney disease, unspecified: Secondary | ICD-10-CM

## 2012-07-30 DIAGNOSIS — T50901A Poisoning by unspecified drugs, medicaments and biological substances, accidental (unintentional), initial encounter: Secondary | ICD-10-CM

## 2012-07-30 DIAGNOSIS — F319 Bipolar disorder, unspecified: Secondary | ICD-10-CM

## 2012-07-30 DIAGNOSIS — N179 Acute kidney failure, unspecified: Secondary | ICD-10-CM

## 2012-07-30 LAB — VALPROIC ACID LEVEL
Valproic Acid Lvl: 122.5 ug/mL — ABNORMAL HIGH (ref 50.0–100.0)
Valproic Acid Lvl: 125.4 ug/mL — ABNORMAL HIGH (ref 50.0–100.0)

## 2012-07-30 LAB — COMPREHENSIVE METABOLIC PANEL
ALT: 6 U/L (ref 0–35)
ALT: 6 U/L (ref 0–35)
AST: 12 U/L (ref 0–37)
AST: 10 U/L (ref 0–37)
Albumin: 2.6 g/dL — ABNORMAL LOW (ref 3.5–5.2)
Albumin: 2.9 g/dL — ABNORMAL LOW (ref 3.5–5.2)
Alkaline Phosphatase: 48 U/L (ref 39–117)
Alkaline Phosphatase: 52 U/L (ref 39–117)
BUN: 25 mg/dL — ABNORMAL HIGH (ref 6–23)
BUN: 33 mg/dL — ABNORMAL HIGH (ref 6–23)
CO2: 23 mEq/L (ref 19–32)
CO2: 24 mEq/L (ref 19–32)
Calcium: 9.3 mg/dL (ref 8.4–10.5)
Calcium: 9.5 mg/dL (ref 8.4–10.5)
Chloride: 109 mEq/L (ref 96–112)
Chloride: 107 mEq/L (ref 96–112)
Creatinine, Ser: 2.52 mg/dL — ABNORMAL HIGH (ref 0.50–1.10)
Creatinine, Ser: 2.84 mg/dL — ABNORMAL HIGH (ref 0.50–1.10)
GFR calc Af Amer: 24 mL/min — ABNORMAL LOW (ref >90)
GFR calc Af Amer: 21 mL/min — ABNORMAL LOW (ref >90)
GFR calc non Af Amer: 21 mL/min — ABNORMAL LOW (ref >90)
GFR calc non Af Amer: 18 mL/min — ABNORMAL LOW (ref >90)
Glucose, Bld: 90 mg/dL (ref 70–99)
Glucose, Bld: 113 mg/dL — ABNORMAL HIGH (ref 70–99)
Potassium: 4.7 mEq/L (ref 3.5–5.1)
Potassium: 4 mEq/L (ref 3.5–5.1)
Sodium: 141 mEq/L (ref 135–145)
Sodium: 144 mEq/L (ref 135–145)
Total Bilirubin: 0.1 mg/dL — ABNORMAL LOW (ref 0.3–1.2)
Total Bilirubin: 0.1 mg/dL — ABNORMAL LOW (ref 0.3–1.2)
Total Protein: 6.4 g/dL (ref 6.0–8.3)
Total Protein: 7.2 g/dL (ref 6.0–8.3)

## 2012-07-30 LAB — BASIC METABOLIC PANEL
BUN: 25 mg/dL — ABNORMAL HIGH (ref 6–23)
BUN: 25 mg/dL — ABNORMAL HIGH (ref 6–23)
CO2: 25 mEq/L (ref 19–32)
CO2: 23 mEq/L (ref 19–32)
Calcium: 9.5 mg/dL (ref 8.4–10.5)
Calcium: 9.2 mg/dL (ref 8.4–10.5)
Chloride: 107 mEq/L (ref 96–112)
Chloride: 112 mEq/L (ref 96–112)
Creatinine, Ser: 2.6 mg/dL — ABNORMAL HIGH (ref 0.50–1.10)
Creatinine, Ser: 2.7 mg/dL — ABNORMAL HIGH (ref 0.50–1.10)
GFR calc Af Amer: 23 mL/min — ABNORMAL LOW (ref >90)
GFR calc Af Amer: 22 mL/min — ABNORMAL LOW (ref >90)
GFR calc non Af Amer: 20 mL/min — ABNORMAL LOW (ref >90)
GFR calc non Af Amer: 19 mL/min — ABNORMAL LOW (ref >90)
Glucose, Bld: 97 mg/dL (ref 70–99)
Glucose, Bld: 142 mg/dL — ABNORMAL HIGH (ref 70–99)
Potassium: 4.6 mEq/L (ref 3.5–5.1)
Potassium: 4 mEq/L (ref 3.5–5.1)
Sodium: 140 mEq/L (ref 135–145)
Sodium: 146 mEq/L — ABNORMAL HIGH (ref 135–145)

## 2012-07-30 LAB — BLOOD GAS, ARTERIAL
Acid-base deficit: 3.1 mmol/L — ABNORMAL HIGH (ref 0.0–2.0)
Bicarbonate: 23 mEq/L (ref 20.0–24.0)
Drawn by: 317871
O2 Content: 2 L/min
O2 Saturation: 95.5 %
Patient temperature: 37
TCO2: 21.9 mmol/L (ref 0–100)
pCO2 arterial: 49.4 mmHg — ABNORMAL HIGH (ref 35.0–45.0)
pH, Arterial: 7.29 — ABNORMAL LOW (ref 7.350–7.450)
pO2, Arterial: 80.7 mmHg (ref 80.0–100.0)

## 2012-07-30 LAB — LACTIC ACID, PLASMA
Lactic Acid, Venous: 1.2 mmol/L (ref 0.5–2.2)
Lactic Acid, Venous: 2 mmol/L (ref 0.5–2.2)

## 2012-07-30 LAB — GLUCOSE, CAPILLARY
Glucose-Capillary: 100 mg/dL — ABNORMAL HIGH (ref 70–99)
Glucose-Capillary: 128 mg/dL — ABNORMAL HIGH (ref 70–99)
Glucose-Capillary: 140 mg/dL — ABNORMAL HIGH (ref 70–99)
Glucose-Capillary: 71 mg/dL (ref 70–99)

## 2012-07-30 LAB — CBC
HCT: 30.2 % — ABNORMAL LOW (ref 36.0–46.0)
Hemoglobin: 9.7 g/dL — ABNORMAL LOW (ref 12.0–15.0)
MCH: 30.2 pg (ref 26.0–34.0)
MCHC: 32.1 g/dL (ref 30.0–36.0)
MCV: 94.1 fL (ref 78.0–100.0)
Platelets: 217 10*3/uL (ref 150–400)
RBC: 3.21 MIL/uL — ABNORMAL LOW (ref 3.87–5.11)
RDW: 15.7 % — ABNORMAL HIGH (ref 11.5–15.5)
WBC: 6.7 10*3/uL (ref 4.0–10.5)

## 2012-07-30 LAB — AMMONIA: Ammonia: 38 umol/L (ref 11–60)

## 2012-07-30 LAB — MRSA PCR SCREENING: MRSA by PCR: NEGATIVE

## 2012-07-30 MED ORDER — SODIUM CHLORIDE 0.9 % IV BOLUS (SEPSIS)
1000.0000 mL | Freq: Once | INTRAVENOUS | Status: AC
  Administered 2012-07-30: 1000 mL via INTRAVENOUS

## 2012-07-30 MED ORDER — HEPARIN SODIUM (PORCINE) 5000 UNIT/ML IJ SOLN
5000.0000 [IU] | Freq: Three times a day (TID) | INTRAMUSCULAR | Status: DC
  Administered 2012-07-30 – 2012-08-01 (×8): 5000 [IU] via SUBCUTANEOUS
  Filled 2012-07-30 (×11): qty 1

## 2012-07-30 MED ORDER — DEXTROSE-NACL 5-0.45 % IV SOLN
INTRAVENOUS | Status: DC
  Administered 2012-07-30: 1000 mL via INTRAVENOUS
  Administered 2012-07-31: 07:00:00 via INTRAVENOUS

## 2012-07-30 MED ORDER — GLUCAGON (RDNA) 1 MG IJ KIT
PACK | INTRAVENOUS | Status: DC
  Administered 2012-07-30 – 2012-07-31 (×2): via INTRAVENOUS
  Filled 2012-07-30 (×7): qty 250

## 2012-07-30 MED ORDER — SODIUM CHLORIDE 0.9 % IV BOLUS (SEPSIS)
1000.0000 mL | Freq: Once | INTRAVENOUS | Status: AC
Start: 2012-07-30 — End: 2012-07-30
  Administered 2012-07-30: 1000 mL via INTRAVENOUS

## 2012-07-30 MED ORDER — SODIUM CHLORIDE 0.9 % IV SOLN: INTRAVENOUS | Status: DC

## 2012-07-30 MED ORDER — NALOXONE HCL 0.4 MG/ML IJ SOLN: 0.4000 mg | Freq: Once | INTRAMUSCULAR | Status: DC

## 2012-07-30 MED ORDER — GLUCAGON HCL (RDNA) 1 MG IJ SOLR
5.0000 mg | Freq: Once | INTRAVENOUS | Status: AC
  Administered 2012-07-30: 5 mg via INTRAVENOUS
  Filled 2012-07-30: qty 5

## 2012-07-30 NOTE — Consult Note (Signed)
PULMONARY  / CRITICAL CARE MEDICINE  Name: Jennifer Lawson MRN: [id number] DOB: Jul 25, 1960    ADMISSION DATE:  07/29/2012 CONSULTATION DATE:  3/24  REFERRING MD :  vann  CHIEF COMPLAINT:   Polysubstance overdose, altered mental status.   BRIEF PATIENT DESCRIPTION:  This is a 52 year old female who was admitted on 3/24 s/p poly-substance overdose which included: Norvasc, clonidine, metoprolol, Depakote, Lunesta, Lamictal, BuSpar, Wellbutrin, Geodon. Admitted to the ICU. PCCM asked to eval given concern for possible clinical decline.  SIGNIFICANT EVENTS / STUDIES:  CT C spine 3/24: No acute osseous abnormality. No cervical spine fracture or dislocation. Study mildly degraded by motion artifact.  CT head 3/24: neg   LINES / TUBES:  CULTURES:   ANTIBIOTICS:   HISTORY OF PRESENT ILLNESS:    This is a 52 y.o. year old female with past medical history of bipolar disorder, drug overdose with recent admission October 2013, stage IV CKD presenting with fall and altered mental status status post intentional drug overdose on 3/24 . Per her report, patient was an argument with her daughter when her daughter witnessed patient taking a weeks worth of her medications from her pillbox all at once these included: CCB, buspar, clonidine, lasix, vistaril, lamictal, lopressor, and geodon (see below).  A head and neck CT was obtained that was negative for any intracranial or C-spine abnormalities. Poison control was called with recommendations for fluid resuscitation, symptomatic management and as clinically indicated. Patient given 2 boluses of IV fluids. Patient also given IV Narcan with no clinical response. Patient has remained somnolent throughout the course. UDS negative. Ethanol, salicylate, acetaminophen levels within normal limits. Depakote level recommendations were also given my poison control. She was admitted to the ICU for close monitoring. Rx has included: IVFs, supplemental oxygen, telemetry  monitoring. PCCM was asked to see given concern for possible clinical decline.   Patient Active Problem List   Diagnosis   .  Drug overdose   .  Bipolar disorder   .  Dehydration   .  ARF (acute renal failure)   .  CKD (chronic kidney disease)   .  Tachycardia   .  HTN (hypertension)      PAST MEDICAL HISTORY :  Past Medical History  Diagnosis Date  . Mental disorder   . Depression   . Hypertension   . Diabetes mellitus without complication   . Overdose    History reviewed. No pertinent past surgical history. Prior to Admission medications   Medication Sig Start Date End Date Taking? Authorizing Provider  amLODipine (NORVASC) 10 MG tablet Take 10 mg by mouth daily.   Yes Historical Provider, MD  busPIRone (BUSPAR) 10 MG tablet Take 10 mg by mouth 3 (three) times daily.   Yes Historical Provider, MD  cloNIDine (CATAPRES) 0.1 MG tablet Take 0.1 mg by mouth 2 (two) times daily.   Yes Historical Provider, MD  divalproex (DEPAKOTE) 500 MG DR tablet Take 500 mg by mouth 2 (two) times daily.   Yes Historical Provider, MD  furosemide (LASIX) 40 MG tablet Take 40 mg by mouth daily.   Yes Historical Provider, MD  hydrOXYzine (VISTARIL) 50 MG capsule Take 50 mg by mouth 3 (three) times daily as needed for anxiety.   Yes Historical Provider, MD  metoprolol succinate (TOPROL-XL) 100 MG 24 hr tablet Take 100 mg by mouth daily. Take with or immediately following a meal.   Yes Historical Provider, MD  mirtazapine (REMERON) 15 MG tablet Take 15 mg by  mouth at bedtime. At 9pm   Yes Historical Provider, MD  zolpidem (AMBIEN) 10 MG tablet Take 10 mg by mouth at bedtime as needed for sleep.   Yes Historical Provider, MD  buPROPion (WELLBUTRIN SR) 150 MG 12 hr tablet Take 150 mg by mouth daily.    Historical Provider, MD  doxepin (SINEQUAN) 75 MG capsule Take 1 capsule (75 mg total) by mouth at bedtime. 02/23/12   Rhetta Mura, MD  eszopiclone (LUNESTA) 1 MG TABS Take 3 mg by mouth at bedtime.  Take immediately before bedtime    Historical Provider, MD  lamoTRIgine (LAMICTAL) 25 MG tablet Take 25 mg by mouth daily. 1 tablet orally every other day  x14 days then increase to 1 tablet daily x 14 days. rx filled 11/21/12    Historical Provider, MD  ziprasidone (GEODON) 60 MG capsule Take 120 mg by mouth at bedtime.    Historical Provider, MD   Allergies  Allergen Reactions  . Codeine Sulfate Anaphylaxis    Daughter called about having this allergy     FAMILY HISTORY:  History reviewed. No pertinent family history. SOCIAL HISTORY:  reports that she has been smoking.  She has never used smokeless tobacco. She reports that  drinks alcohol. She reports that she does not use illicit drugs.  REVIEW OF SYSTEMS:   Lethargic   SUBJECTIVE:  Lethargic  VITAL SIGNS: Temp:  [97.2 F (36.2 C)-98.1 F (36.7 C)] 97.2 F (36.2 C) (03/24 0800) Pulse Rate:  [51-73] 57 (03/24 1100) Resp:  [9-19] 14 (03/24 1100) BP: (69-134)/(37-101) 82/52 mmHg (03/24 1100) SpO2:  [94 %-100 %] 97 % (03/24 1100) Weight:  [88.1 kg (194 lb 3.6 oz)] 88.1 kg (194 lb 3.6 oz) (03/24 0439) HEMODYNAMICS:   VENTILATOR SETTINGS:   INTAKE / OUTPUT: Intake/Output     03/23 0701 - 03/24 0700 03/24 0701 - 03/25 0700   I.V. (mL/kg) 668.3 (7.6) 400 (4.5)   IV Piggyback 2000 1000   Total Intake(mL/kg) 2668.3 (30.3) 1400 (15.9)   Urine (mL/kg/hr) 5000 400 (1)   Total Output 5000 400   Net -2331.7 +1000          PHYSICAL EXAMINATION: General:  Awake, agitated as she wants ice  Neuro:  Confused no focal def  HEENT:  edentulous no JVD  Cardiovascular:  rrr Lungs: decreased  Abdomen:  Intact  Musculoskeletal:  Intact  Skin:  Intact   LABS:  Recent Labs Lab 07/29/12 2026 07/30/12 0019 07/30/12 0333  NA 139 140 141  K 4.2 4.6 4.7  CL 103 107 109  CO2 25 25 23   BUN 25* 25* 25*  CREATININE 2.70* 2.60* 2.52*  GLUCOSE 87 97 90    Recent Labs Lab 07/29/12 2026 07/30/12 0333  HGB 10.2* 9.7*  HCT 32.4*  30.2*  WBC 6.7 6.7  PLT 231 217    Recent Labs Lab 07/29/12 2026 07/30/12 0333  ALT 7 6  AST 13 12  ALKPHOS 56 48  BILITOT 0.1* 0.1*   ABG    Component Value Date/Time   PHART 7.290* 07/30/2012 0308   PCO2ART 49.4* 07/30/2012 0308   PO2ART 80.7 07/30/2012 0308   HCO3 23.0 07/30/2012 0308   TCO2 21.9 07/30/2012 0308   ACIDBASEDEF 3.1* 07/30/2012 0308   O2SAT 95.5 07/30/2012 0308    Recent Labs Lab 07/29/12 2036 07/30/12 0809  GLUCAP 80 71    CXR: low volume. RLL atx vs early infiltrate   ASSESSMENT / PLAN:  PULMONARY A: no acute issue  P:   Supportive care   CARDIOVASCULAR A: circulatory shock/hypotension. Likely residual effect from antihypertensives and sedating meds. Hard to know how much of which drug is having the largest effect right now.  P:  Hold all home meds  Cont glucagon bolus (for CCB), if effective will start at gtt. If no effect will try 10% calcium gluconate bolus May need pressor support.   RENAL A:  CKD  Scr currently at baseline 2.52 P:   IVFs Hold all antihypertensives  GASTROINTESTINAL A:  No acute issue. Did have depakote included in her OD  P:   Trend LFTs Ck ammonia level   HEMATOLOGIC A:  Anemia of chronic disease.  P:  Trend CBC   INFECTIOUS A:  No clear evidence of infection  P:   Trend CBC   ENDOCRINE A:  Hypoglycemia  P:   Cont dextrose   NEUROLOGIC A:  Acute encephalopathy in setting of OD P:   Supportive care.  Will need psych eval   TODAY'S SUMMARY: 67 yof w/ ams d/t polysubstance od. Will cont supportive care as outlined above.   I have personally obtained a history, examined the patient, evaluated laboratory and imaging results, formulated the assessment and plan and placed orders. CRITICAL CARE: The patient is critically ill with multiple organ systems failure and requires high complexity decision making for assessment and support, frequent evaluation and titration of therapies, application of advanced  monitoring technologies and extensive interpretation of multiple databases. Critical Care Time devoted to patient care services described in this note is .   Dorcas Carrow Beeper  [phone]  Cell  [phone]  If no response or cell goes to voicemail, call beeper [phone]  Pulmonary and Critical Care Medicine [facility] Pager: [phone]  07/30/2012, 11:38 AM

## 2012-07-30 NOTE — Progress Notes (Signed)
List of medication bottles brought from home. Clonidine 0.1, Furosimide 40mg , Zolpidem 10mg , Hydroxyzine 50mg , Amlodipine 10mg ,, Divalporex 500mg , Mirtazine 15mg , Metoprolol ER 100mg , Busprirone HCL 10mg , All counted and placed in pharmacy per protocol. Barnett Hatter P

## 2012-07-30 NOTE — H&P (Signed)
Hospitalist Admission History and Physical  Patient name: Jennifer Lawson Medical record number: [id number] Date of birth: 1961/02/19 Age: 52 y.o. Gender: female  Primary Care Provider: No primary provider on file.  Chief Complaint: drug overdose.  History of Present Illness: Level V caveat as pt is minimally responsive. History from nursing, EDP, and ER notes.  This is a 51 y.o. year old female with past medical history of bipolar disorder, drug overdose with recent admission October 2013, stage IV CKD presenting with fall and altered mental status status post intentional drug overdose. Per her report, patient was an argument with her daughter when her daughter witnessed patient taking a weeks worth of her medications from her pillbox all at once. Medications are noted below. Patient then fell to the floor striking her head. EMS was called.  Patient was brought to the ER. A head and neck CT was obtained that was negative for any intracranial or C-spine abnormalities. Poison control was called with recommendations for fluid resuscitation, symptomatic management and calcium channel blocker/beta blocker overdose treatment as clinically indicated. Patient given 2 boluses of IV fluids. Patient also given IV Narcan with no clinical response. Patient has remained somnolent throughout the course.  UDS negative.  Ethanol, salicylate, acetaminophen levels within normal limits. Depakote level recommendations were also given my poison control. First set within normal limits.  Vital signs have remained relatively stable throughout course apart from lower limit of normal blood pressures.patient also initially had O2 sats around 92% on presentation. Patient placed on supplemental oxygen. A chest x-ray is pending.   Patient Active Problem List  Diagnosis  . Drug overdose  . Bipolar disorder  . Dehydration  . ARF (acute renal failure)  . CKD (chronic kidney disease)  . Tachycardia  . HTN (hypertension)   Past  Medical History: Past Medical History  Diagnosis Date  . Mental disorder   . Depression   . Hypertension   . Diabetes mellitus without complication   . Overdose     Past Surgical History: History reviewed. No pertinent past surgical history.  Social History: History   Social History  . Marital Status: Divorced    Spouse Name: N/A    Number of Children: N/A  . Years of Education: N/A   Social History Main Topics  . Smoking status: Current Every Day Smoker  . Smokeless tobacco: None  . Alcohol Use: Yes  . Drug Use: No  . Sexually Active: None   Other Topics Concern  . None   Social History Narrative  . None    Family History: History reviewed. No pertinent family history.  Allergies: Allergies  Allergen Reactions  . Codeine Sulfate Anaphylaxis    Daughter called about having this allergy     Current Facility-Administered Medications  Medication Dose Route Frequency Provider Last Rate Last Dose  . 0.9 %  sodium chloride infusion   Intravenous Continuous Doree Albee, MD      . heparin injection 5,000 Units  5,000 Units Subcutaneous Q8H Doree Albee, MD       Current Outpatient Prescriptions  Medication Sig Dispense Refill  . amLODipine (NORVASC) 10 MG tablet Take 10 mg by mouth daily.      . busPIRone (BUSPAR) 10 MG tablet Take 10 mg by mouth 3 (three) times daily.      . cloNIDine (CATAPRES) 0.1 MG tablet Take 0.1 mg by mouth 2 (two) times daily.      . divalproex (DEPAKOTE) 500 MG DR tablet Take 500 mg by  mouth 2 (two) times daily.      . furosemide (LASIX) 40 MG tablet Take 40 mg by mouth daily.      . hydrOXYzine (VISTARIL) 50 MG capsule Take 50 mg by mouth 3 (three) times daily as needed for anxiety.      . metoprolol succinate (TOPROL-XL) 100 MG 24 hr tablet Take 100 mg by mouth daily. Take with or immediately following a meal.      . mirtazapine (REMERON) 15 MG tablet Take 15 mg by mouth at bedtime. At 9pm      . zolpidem (AMBIEN) 10 MG tablet Take  10 mg by mouth at bedtime as needed for sleep.      Marland Kitchen buPROPion (WELLBUTRIN SR) 150 MG 12 hr tablet Take 150 mg by mouth daily.      Marland Kitchen doxepin (SINEQUAN) 75 MG capsule Take 1 capsule (75 mg total) by mouth at bedtime.  30 capsule  0  . eszopiclone (LUNESTA) 1 MG TABS Take 3 mg by mouth at bedtime. Take immediately before bedtime      . lamoTRIgine (LAMICTAL) 25 MG tablet Take 25 mg by mouth daily. 1 tablet orally every other day  x14 days then increase to 1 tablet daily x 14 days. rx filled 11/21/12      . ziprasidone (GEODON) 60 MG capsule Take 120 mg by mouth at bedtime.       Review Of Systems: 12 point ROS negative except as noted above in HPI.  Physical Exam: Filed Vitals:   07/29/12 2215  BP: 103/71  Pulse: 71  Temp:   Resp: 17    General: somnolent, minimally responsive even to sternal rubbing.  HEENT: miotic pupils bilaterally, minimally responsive to light  Heart: S1, S2 normal, no murmur, rub or gallop, regular rate and rhythm Lungs: decreased breath sounds secondary to resp effort, no focal abnormalities on auscultation.  Abdomen: abdomen is soft without significant tenderness, masses, organomegaly or guarding Extremities: extremities normal, atraumatic, no cyanosis or edema Skin:no rashes, no ecchymoses Neurology: limited exam secondary to somnolence   Labs and Imaging: Lab Results  Component Value Date/Time   NA 139 07/29/2012  8:26 PM   K 4.2 07/29/2012  8:26 PM   CL 103 07/29/2012  8:26 PM   CO2 25 07/29/2012  8:26 PM   BUN 25* 07/29/2012  8:26 PM   CREATININE 2.70* 07/29/2012  8:26 PM   GLUCOSE 87 07/29/2012  8:26 PM   Lab Results  Component Value Date   WBC 6.7 07/29/2012   HGB 10.2* 07/29/2012   HCT 32.4* 07/29/2012   MCV 93.6 07/29/2012   PLT 231 07/29/2012   Drugs of Abuse     Component Value Date/Time   LABOPIA NONE DETECTED 07/29/2012 2046   COCAINSCRNUR NONE DETECTED 07/29/2012 2046   LABBENZ NONE DETECTED 07/29/2012 2046   AMPHETMU NONE DETECTED 07/29/2012  2046   THCU NONE DETECTED 07/29/2012 2046   LABBARB NONE DETECTED 07/29/2012 2046   Urinalysis    Component Value Date/Time   COLORURINE YELLOW 07/29/2012 2046   APPEARANCEUR CLEAR 07/29/2012 2046   LABSPEC 1.009 07/29/2012 2046   PHURINE 7.0 07/29/2012 2046   GLUCOSEU NEGATIVE 07/29/2012 2046   HGBUR NEGATIVE 07/29/2012 2046   BILIRUBINUR NEGATIVE 07/29/2012 2046   KETONESUR NEGATIVE 07/29/2012 2046   PROTEINUR NEGATIVE 07/29/2012 2046   UROBILINOGEN 0.2 07/29/2012 2046   NITRITE NEGATIVE 07/29/2012 2046   LEUKOCYTESUR NEGATIVE 07/29/2012 2046   Alcohol Level    Component Value Date/Time  ETH <11 07/29/2012 2026    Acetaminophen level: WNL   Salicylate level: < 2  Depakote Level: 66.9      Ct Head Wo Contrast  07/29/2012  *RADIOLOGY REPORT*  Clinical Data:  Fall. overdose.  No unresponsive patient.  CT HEAD WITHOUT CONTRAST CT CERVICAL SPINE WITHOUT CONTRAST  Technique:  Multidetector CT imaging of the head and cervical spine was performed following the standard protocol without intravenous contrast.  Multiplanar CT image reconstructions of the cervical spine were also generated.  Comparison:  02/20/2012.  CT HEAD  Findings: No mass lesion, mass effect, midline shift, hydrocephalus, hemorrhage.  No territorial ischemia or acute infarction.  Calvarium intact.  Paranasal sinuses demonstrate mucous retention cyst or polyp in the posterior aspect of the left maxillary sinus.  Mastoid air cells are clear.  IMPRESSION: Negative CT head.  CT CERVICAL SPINE  Findings: Mild cervical spondylosis is present.  There is no cervical spine fracture or dislocation.  There is mild motion artifact which degrades the study, particularly on the reconstructed sagittal images.  Dextroconvex torticollis is present.  Thyromegaly, most compatible with goiter. The dependent atelectasis in the lung apices.  Mastoid air cells are clear.  Craniocervical alignment and occipital condyles appear normal.  The odontoid intact.   Prevertebral soft tissues appear within normal limits.  The 2 mm retrolisthesis of C4 on C5 appears degenerative.  Some of this may also be accounted for by motion artifact.  C5-C6 and C6-C7 predominant cervical spondylosis with bilateral uncovertebral spurring and foraminal encroachment. Patulous esophagus.  Carotid atherosclerosis.  IMPRESSION: No acute osseous abnormality.  No cervical spine fracture or dislocation.  Study mildly degraded by motion artifact.   Original Report Authenticated By: Andreas Newport, M.D.    Ct Cervical Spine Wo Contrast  07/29/2012  *RADIOLOGY REPORT*  Clinical Data:  Fall. overdose.  No unresponsive patient.  CT HEAD WITHOUT CONTRAST CT CERVICAL SPINE WITHOUT CONTRAST  Technique:  Multidetector CT imaging of the head and cervical spine was performed following the standard protocol without intravenous contrast.  Multiplanar CT image reconstructions of the cervical spine were also generated.  Comparison:  02/20/2012.  CT HEAD  Findings: No mass lesion, mass effect, midline shift, hydrocephalus, hemorrhage.  No territorial ischemia or acute infarction.  Calvarium intact.  Paranasal sinuses demonstrate mucous retention cyst or polyp in the posterior aspect of the left maxillary sinus.  Mastoid air cells are clear.  IMPRESSION: Negative CT head.  CT CERVICAL SPINE  Findings: Mild cervical spondylosis is present.  There is no cervical spine fracture or dislocation.  There is mild motion artifact which degrades the study, particularly on the reconstructed sagittal images.  Dextroconvex torticollis is present.  Thyromegaly, most compatible with goiter. The dependent atelectasis in the lung apices.  Mastoid air cells are clear.  Craniocervical alignment and occipital condyles appear normal.  The odontoid intact.  Prevertebral soft tissues appear within normal limits.  The 2 mm retrolisthesis of C4 on C5 appears degenerative.  Some of this may also be accounted for by motion artifact.  C5-C6  and C6-C7 predominant cervical spondylosis with bilateral uncovertebral spurring and foraminal encroachment. Patulous esophagus.  Carotid atherosclerosis.  IMPRESSION: No acute osseous abnormality.  No cervical spine fracture or dislocation.  Study mildly degraded by motion artifact.   Original Report Authenticated By: Andreas Newport, M.D.      Assessment and Plan:  Sanoe Hazan is a 52 y.o. year old female presenting with drug overdose.   Drug overdose: Patient is on  multiple medications including Norvasc, clonidine, metoprolol, Depakote, Lunesta, Lamictal, BuSpar, Wellbutrin, Geodon. Supportive management with IV fluids and supplemental O2. Will place in step down. Unresponsive to Narcan while in the ER. Hopefully somnolence is primarily from remeron +/-  anti-dopaminergic effects of Geodon (may also be cause of miotic pupils). Vital signs have remained fairly stable. Critical care  (Dr. Darrick Penna) made aware of case as patient will be going to step down. Will be watching through the black box overnight. Hold all by mouth medications.  Bipolar disorder: Will need psych consult once medically cleared. Noted hx/o predominant manic phase. Unclear if this was predominant today. Rechecking Depakote levels per poison control recommendations. This will be patient's second intentional drug overdose in less than 6 months.  CKD: Stage IV. At baseline on presentation which is reassuring. Continue IV fluids. Recheck creatinine in 6-8 hours.  Anemia: Likely secondary to CKD. Continue to follow  FEN/GI: N.p.o. Status post 3 L normal saline bolus. IV fluids. Strict Is and Os and daily weights. Prophylaxis:  subcutaneous heparin.  Disposition: Pending further evaluation. Code Status:Full code        Doree Albee MD  Pager: [phone]

## 2012-07-30 NOTE — Progress Notes (Signed)
CARE MANAGEMENT NOTE 07/30/2012  Patient:  [facility]   Account Number:  [id number]  Date Initiated:  07/30/2012  Documentation initiated by:  Kendelle Schweers  Subjective/Objective Assessment:   pt with substance abuse history and now overdose     Action/Plan:   from home   Anticipated DC Date:  08/02/2012   Anticipated DC Plan:  HOME/SELF CARE  In-house referral  NA      DC Planning Services  NA      [facility] Choice  NA   Choice offered to / List presented to:  NA   DME arranged  NA      DME agency  NA     HH arranged  NA      HH agency  NA   Status of service:  In process, will continue to follow Medicare Important Message given?  NA - LOS <3 / Initial given by admissions (If response is "NO", the following Medicare IM given date fields will be blank) Date Medicare IM given:   Date Additional Medicare IM given:    Discharge Disposition:    Per UR Regulation:  Reviewed for med. necessity/level of care/duration of stay  If discussed at Long Length of Stay Meetings, dates discussed:    Comments:  [id number]/Sy Saintjean Earlene Plater, RN, BSN, CCM:  CHART REVIEWED AND UPDATED.  Next chart review due on [id number]. NO DISCHARGE NEEDS PRESENT AT THIS TIME. CASE MANAGEMENT [id number]

## 2012-07-30 NOTE — Progress Notes (Signed)
Dr. Benjamine Mola called re: pt's low BPs and CBG of 71 this am; orders noted and ns bolus initiated. Anthonette Legato

## 2012-07-30 NOTE — Progress Notes (Addendum)
Patient admitted early this AM by Dr. Alvester Morin.  Please see H&p.  Patient is not able to give much information.  Asking to eat.  No teeth- left them at home.  Patient's BP trending down despite about 5L NS total- BP is responding to bolus- may need pressors- asked critical care to consult formally.  Will give glucagon as may have taken OD of clonidine, BB, norvasc.   Psych consult for tomm called.    Poison control following for depakote level- ammonia and BMP ordered.  - changed to ICU status.   Marlin Canary DO

## 2012-07-30 NOTE — Progress Notes (Signed)
MEDICATION RELATED CONSULT NOTE - INITIAL   Pharmacy Consult for Glucagon Infusion Indication: Polysubstance Overdose  Allergies  Allergen Reactions  . Codeine Sulfate Anaphylaxis    Daughter called about having this allergy     Patient Measurements: Height: 5\' 3"  (160 cm) Weight: 194 lb 3.6 oz (88.1 kg) IBW/kg (Calculated) : 52.4  Vital Signs: Temp: 97.2 F (36.2 C) (03/24 0800) Temp src: Axillary (03/24 0800) BP: 78/44 mmHg (03/24 1130) Pulse Rate: 59 (03/24 1130)  Labs:  Recent Labs  07/29/12 2026 07/30/12 0019 07/30/12 0333  WBC 6.7  --  6.7  HGB 10.2*  --  9.7*  HCT 32.4*  --  30.2*  PLT 231  --  217  CREATININE 2.70* 2.60* 2.52*  ALBUMIN 3.1*  --  2.6*  PROT 7.6  --  6.4  AST 13  --  12  ALT 7  --  6  ALKPHOS 56  --  48  BILITOT 0.1*  --  0.1*   Estimated Creatinine Clearance: 27.5 ml/min (by C-G formula based on Cr of 2.52).  Medical History: Past Medical History  Diagnosis Date  . Mental disorder   . Depression   . Hypertension   . Diabetes mellitus without complication   . Overdose     Medications:  Scheduled:  . [COMPLETED] glucagon (GLUCAGEN) 5 MG IV  5 mg Intravenous Once  . heparin  5,000 Units Subcutaneous Q8H  . [COMPLETED] naLOXone (NARCAN)  injection  0.4 mg Intravenous Once  . [COMPLETED] sodium chloride  1,000 mL Intravenous Once  . [COMPLETED] sodium chloride  1,000 mL Intravenous Once  . [COMPLETED] sodium chloride  1,000 mL Intravenous Once  . [COMPLETED] sodium chloride  1,000 mL Intravenous Once  . [COMPLETED] sodium chloride  1,000 mL Intravenous Once  . [DISCONTINUED] naLOXone (NARCAN)  injection  0.4 mg Intravenous Once   Infusions:  . dextrose 5 % and 0.45% NaCl 1,000 mL (07/30/12 1005)  . [DISCONTINUED] sodium chloride 100 mL/hr at 07/30/12 0019    Assessment:  51 yof admit 3/24 with overdose on home meds (includes Clonidine, Furosimide, Zolpidem, Hydroxyzine, Amlodipine, Divalporex, Mirtazine, Metoprolol XL,  Busprirone).  PMH includes bipolar disorder, drug overdose and recent admission 02/2012.  Completed Glucogon infusion of 5mg  IVPB with improvement in BP.  Currently, BP 78/44  CKD at baseline, SCr increased to 2.7, CrCl ~ 27 ml/min.   Plan:  Glucagon 25mg  in d5W Start at 3-5mg  per hour (30-50 ml/hr) and titrate infusion to adequate hemodynamic response   Lynann Beaver PharmD, BCPS Pager [phone] 07/30/2012 1:30 PM

## 2012-07-31 DIAGNOSIS — Z5189 Encounter for other specified aftercare: Secondary | ICD-10-CM

## 2012-07-31 LAB — COMPREHENSIVE METABOLIC PANEL
ALT: 6 U/L (ref 0–35)
AST: 10 U/L (ref 0–37)
Albumin: 2.7 g/dL — ABNORMAL LOW (ref 3.5–5.2)
Alkaline Phosphatase: 48 U/L (ref 39–117)
BUN: 38 mg/dL — ABNORMAL HIGH (ref 6–23)
CO2: 24 mEq/L (ref 19–32)
Calcium: 9.4 mg/dL (ref 8.4–10.5)
Chloride: 104 mEq/L (ref 96–112)
Creatinine, Ser: 2.78 mg/dL — ABNORMAL HIGH (ref 0.50–1.10)
GFR calc Af Amer: 21 mL/min — ABNORMAL LOW (ref >90)
GFR calc non Af Amer: 18 mL/min — ABNORMAL LOW (ref >90)
Glucose, Bld: 187 mg/dL — ABNORMAL HIGH (ref 70–99)
Potassium: 3.9 mEq/L (ref 3.5–5.1)
Sodium: 139 mEq/L (ref 135–145)
Total Bilirubin: 0.1 mg/dL — ABNORMAL LOW (ref 0.3–1.2)
Total Protein: 6.3 g/dL (ref 6.0–8.3)

## 2012-07-31 LAB — GLUCOSE, CAPILLARY
Glucose-Capillary: 91 mg/dL (ref 70–99)
Glucose-Capillary: 135 mg/dL — ABNORMAL HIGH (ref 70–99)
Glucose-Capillary: 147 mg/dL — ABNORMAL HIGH (ref 70–99)
Glucose-Capillary: 147 mg/dL — ABNORMAL HIGH (ref 70–99)
Glucose-Capillary: 90 mg/dL (ref 70–99)

## 2012-07-31 LAB — CBC
HCT: 27.5 % — ABNORMAL LOW (ref 36.0–46.0)
Hemoglobin: 9.2 g/dL — ABNORMAL LOW (ref 12.0–15.0)
MCH: 31.1 pg (ref 26.0–34.0)
MCHC: 33.5 g/dL (ref 30.0–36.0)
MCV: 92.9 fL (ref 78.0–100.0)
Platelets: 206 10*3/uL (ref 150–400)
RBC: 2.96 MIL/uL — ABNORMAL LOW (ref 3.87–5.11)
RDW: 15.5 % (ref 11.5–15.5)
WBC: 7.2 10*3/uL (ref 4.0–10.5)

## 2012-07-31 LAB — CALCIUM, IONIZED: Calcium, Ion: 1.42 mmol/L — ABNORMAL HIGH (ref 1.12–1.23)

## 2012-07-31 LAB — VALPROIC ACID LEVEL: Valproic Acid Lvl: 106.6 ug/mL — ABNORMAL HIGH (ref 50.0–100.0)

## 2012-07-31 MED ORDER — ACETAMINOPHEN 325 MG PO TABS
650.0000 mg | ORAL_TABLET | Freq: Four times a day (QID) | ORAL | Status: DC | PRN
  Administered 2012-07-31 – 2012-08-01 (×5): 650 mg via ORAL
  Filled 2012-07-31 (×5): qty 2

## 2012-07-31 MED ORDER — ACETAMINOPHEN 325 MG PO TABS: 650.0000 mg | ORAL_TABLET | ORAL | Status: DC | PRN

## 2012-07-31 MED ORDER — INSULIN ASPART 100 UNIT/ML ~~LOC~~ SOLN: 0.0000 [IU] | Freq: Every day | SUBCUTANEOUS | Status: DC

## 2012-07-31 MED ORDER — INSULIN ASPART 100 UNIT/ML ~~LOC~~ SOLN
0.0000 [IU] | Freq: Three times a day (TID) | SUBCUTANEOUS | Status: DC
  Administered 2012-07-31: 2 [IU] via SUBCUTANEOUS

## 2012-07-31 NOTE — Progress Notes (Signed)
INITIAL NUTRITION ASSESSMENT  DOCUMENTATION CODES Per approved criteria  -Obesity Unspecified   INTERVENTION: Provide "Type 2 Diabetes Nutrition Therapy" education  NUTRITION DIAGNOSIS: Food and nutrition related knowledge deficit related to lack of prior exposure to accurate information as evidenced by pt report.   Goal: Pt able to report 3 food sources of carbohydrates Pt to meet >/= 90% of their estimated nutrition needs  Monitor:  Labs Wt Po intake  Reason for Assessment: MST  52 y.o. female  Admitting Dx:   ASSESSMENT: 52 year old female who was admitted on 3/24 s/p poly-substance overdose which included: Norvasc, clonidine, metoprolol, Depakote, Lunesta, Lamictal, BuSpar, Wellbutrin, Geodon. Admitted to the ICU. Pt reports wt loss but states usual body weight is 179 lbs which is less than what pt currently weighs (198 lbs). Pt reports that she is hungry, appetite has been normal, and she usually eats 3 meals daily. Pt does not follow any special diet. Pt not interested in renal diet education stating, my kidney's are fine but, pt willing to briefly discuss high sodium and high potassium foods to avoid. Encouraged limiting intake of dairy due to being high in phosphorus. Pt requested diet education for diabetes management. Gave pt "Type 2 Diabetes Nutrition Therapy" handout from the Academy [employer]. Reviewed sources of carbohydrates, foods to avoid, and plate method for balancing carbohydrates.  Height: Ht Readings from Last 1 Encounters:  07/30/12 5\' 3"  (1.6 m)    Weight: Wt Readings from Last 1 Encounters:  07/31/12 198 lb 6.6 oz (90 kg)    Ideal Body Weight: 115 lbs  % Ideal Body Weight: 172%  Wt Readings from Last 10 Encounters:  07/31/12 198 lb 6.6 oz (90 kg)  02/21/12 204 lb 9.4 oz (92.8 kg)    Usual Body Weight: 179 lbs  % Usual Body Weight: 114%  BMI:  Body mass index is 35.16 kg/(m^2).  Estimated Nutritional Needs: Kcal:  2190-2340 Protein: 72-90 grams Fluid: 2.1-2.5 L  Skin: WDL  Diet Order: Carb Control  EDUCATION NEEDS: -Education needs addressed   Intake/Output Summary (Last 24 hours) at 07/31/12 1243 Last data filed at 07/31/12 1100  Gross per 24 hour  Intake   5413 ml  Output   8900 ml  Net  -3487 ml    Last BM: PTA  Labs:   Recent Labs Lab 07/30/12 1232 07/30/12 2019 07/31/12 0335  NA 146* 144 139  K 4.0 4.0 3.9  CL 112 107 104  CO2 23 24 24   BUN 25* 33* 38*  CREATININE 2.70* 2.84* 2.78*  CALCIUM 9.2 9.5 9.4  GLUCOSE 142* 113* 187*    CBG (last 3)   Recent Labs  07/30/12 2337 07/31/12 0806 07/31/12 1233  GLUCAP 140* 135* 147*    Scheduled Meds: . heparin  5,000 Units Subcutaneous Q8H  . insulin aspart  0-15 Units Subcutaneous TID WC  . insulin aspart  0-5 Units Subcutaneous QHS    Continuous Infusions:   Past Medical History  Diagnosis Date  . Mental disorder   . Depression   . Hypertension   . Diabetes mellitus without complication   . Overdose     History reviewed. No pertinent past surgical history.  Ian Malkin RD, LDN Inpatient Clinical Dietitian Pager: [phone] After Hours Pager: [phone]

## 2012-07-31 NOTE — Consult Note (Signed)
Reason for Consult: Polysubstance abuse and history of mental illness Referring Physician: Dr. [name] is an 52 y.o. female.  HPI: Patient was seen and chart reviewed. Patient was presented to the [facility] with the overdose on her heart and blood pressure pills. Patient stated she is trying to get sleep because she cannot sleep. Patient was unable to provide the number of pills she he was taken. Patient reported she has been diagnosed with posttraumatic stress disorder and bipolar disorder. Patient denies current symptoms of suicidal ideation, homicidal ideation and psychotic symptoms. Patient stated" I want to go home". I have tried to reach patient daughter without success for clinical information. Patient denied drug of abuse. Patient has been following with toe psychiatrist a Dr. Jannifer Lawson. Patient denied symptoms of posttraumatic stress disorder, depression, mania, and psychosis. Reportedly patient was taking Geodon, Remeron and Depakote. Her Depakote levels were slightly higher than therapeutic range. Patient is willing to followup with outpatient psychiatric service as scheduleds  MSE: Patient was awake, alert, oriented. Patient was calm and cooperative. Patient has been contracting for safety. Patient denies suicidal or homicidal ideation, intents or plans. Patient has no evidence of psychotic symptoms. At this time. Patient is a poor insight judgment and impulse control.  Past Medical History  Diagnosis Date  . Mental disorder   . Depression   . Hypertension   . Diabetes mellitus without complication   . Overdose     History reviewed. No pertinent past surgical history.  History reviewed. No pertinent family history.  Social History:  reports that she has been smoking.  She has never used smokeless tobacco. She reports that  drinks alcohol. She reports that she does not use illicit drugs.  Allergies:  Allergies  Allergen Reactions  . Codeine  Sulfate Anaphylaxis    Daughter called about having this allergy     Medications: I have reviewed the patient's current medications.  Results for orders placed during the hospital encounter of 07/29/12 (from the past 48 hour(s))  CBC     Status: Abnormal   Collection Time    07/29/12  8:26 PM      Result Value Range   WBC 6.7  4.0 - 10.5 K/uL   RBC 3.46 (*) 3.87 - 5.11 MIL/uL   Hemoglobin 10.2 (*) 12.0 - 15.0 g/dL   HCT 16.1 (*) 09.6 - 04.5 %   MCV 93.6  78.0 - 100.0 fL   MCH 29.5  26.0 - 34.0 pg   MCHC 31.5  30.0 - 36.0 g/dL   RDW 40.9 (*) 81.1 - 91.4 %   Platelets 231  150 - 400 K/uL  COMPREHENSIVE METABOLIC PANEL     Status: Abnormal   Collection Time    07/29/12  8:26 PM      Result Value Range   Sodium 139  135 - 145 mEq/L   Potassium 4.2  3.5 - 5.1 mEq/L   Chloride 103  96 - 112 mEq/L   CO2 25  19 - 32 mEq/L   Glucose, Bld 87  70 - 99 mg/dL   BUN 25 (*) 6 - 23 mg/dL   Creatinine, Ser 7.82 (*) 0.50 - 1.10 mg/dL   Calcium 95.6  8.4 - 21.3 mg/dL   Total Protein 7.6  6.0 - 8.3 g/dL   Albumin 3.1 (*) 3.5 - 5.2 g/dL   AST 13  0 - 37 U/L   ALT 7  0 - 35 U/L   Alkaline Phosphatase  56  39 - 117 U/L   Total Bilirubin 0.1 (*) 0.3 - 1.2 mg/dL   GFR calc non Af Amer 19 (*) >90 mL/min   GFR calc Af Amer 22 (*) >90 mL/min   Comment:            The eGFR has been calculated     using the CKD EPI equation.     This calculation has not been     validated in all clinical     situations.     eGFR's persistently     <90 mL/min signify     possible Chronic Kidney Disease.  ETHANOL     Status: None   Collection Time    07/29/12  8:26 PM      Result Value Range   Alcohol, Ethyl (B) <11  0 - 11 mg/dL   Comment:            LOWEST DETECTABLE LIMIT FOR     SERUM ALCOHOL IS 11 mg/dL     FOR MEDICAL PURPOSES ONLY  ACETAMINOPHEN LEVEL     Status: None   Collection Time    07/29/12  8:26 PM      Result Value Range   Acetaminophen (Tylenol), Serum <15.0  10 - 30 ug/mL   Comment:             THERAPEUTIC CONCENTRATIONS VARY     SIGNIFICANTLY. A RANGE OF 10-30     ug/mL MAY BE AN EFFECTIVE     CONCENTRATION FOR MANY PATIENTS.     HOWEVER, SOME ARE BEST TREATED     AT CONCENTRATIONS OUTSIDE THIS     RANGE.     ACETAMINOPHEN CONCENTRATIONS     >150 ug/mL AT 4 HOURS AFTER     INGESTION AND >50 ug/mL AT 12     HOURS AFTER INGESTION ARE     OFTEN ASSOCIATED WITH TOXIC     REACTIONS.  SALICYLATE LEVEL     Status: Abnormal   Collection Time    07/29/12  8:26 PM      Result Value Range   Salicylate Lvl <2.0 (*) 2.8 - 20.0 mg/dL  VALPROIC ACID LEVEL     Status: None   Collection Time    07/29/12  8:26 PM      Result Value Range   Valproic Acid Lvl 66.9  50.0 - 100.0 ug/mL  LIPASE, BLOOD     Status: None   Collection Time    07/29/12  8:26 PM      Result Value Range   Lipase 43  11 - 59 U/L  GLUCOSE, CAPILLARY     Status: None   Collection Time    07/29/12  8:36 PM      Result Value Range   Glucose-Capillary 80  70 - 99 mg/dL  URINE RAPID DRUG SCREEN (HOSP PERFORMED)     Status: None   Collection Time    07/29/12  8:46 PM      Result Value Range   Opiates NONE DETECTED  NONE DETECTED   Cocaine NONE DETECTED  NONE DETECTED   Benzodiazepines NONE DETECTED  NONE DETECTED   Amphetamines NONE DETECTED  NONE DETECTED   Tetrahydrocannabinol NONE DETECTED  NONE DETECTED   Barbiturates NONE DETECTED  NONE DETECTED   Comment:            DRUG SCREEN FOR MEDICAL PURPOSES     ONLY.  IF CONFIRMATION IS NEEDED     FOR ANY  PURPOSE, NOTIFY LAB     WITHIN 5 DAYS.                LOWEST DETECTABLE LIMITS     FOR URINE DRUG SCREEN     Drug Class       Cutoff (ng/mL)     Amphetamine      1000     Barbiturate      200     Benzodiazepine   200     Tricyclics       300     Opiates          300     Cocaine          300     THC              50  URINALYSIS, MICROSCOPIC ONLY     Status: None   Collection Time    07/29/12  8:46 PM      Result Value Range   Color, Urine  YELLOW  YELLOW   APPearance CLEAR  CLEAR   Specific Gravity, Urine 1.009  1.005 - 1.030   pH 7.0  5.0 - 8.0   Glucose, UA NEGATIVE  NEGATIVE mg/dL   Hgb urine dipstick NEGATIVE  NEGATIVE   Bilirubin Urine NEGATIVE  NEGATIVE   Ketones, ur NEGATIVE  NEGATIVE mg/dL   Protein, ur NEGATIVE  NEGATIVE mg/dL   Urobilinogen, UA 0.2  0.0 - 1.0 mg/dL   Nitrite NEGATIVE  NEGATIVE   Leukocytes, UA NEGATIVE  NEGATIVE   Urine-Other       Value: NO FORMED ELEMENTS SEEN ON URINE MICROSCOPIC EXAMINATION   Comment: MICROSCOPIC EXAM PERFORMED ON UNCONCENTRATED URINE  BASIC METABOLIC PANEL     Status: Abnormal   Collection Time    07/30/12 12:19 AM      Result Value Range   Sodium 140  135 - 145 mEq/L   Potassium 4.6  3.5 - 5.1 mEq/L   Chloride 107  96 - 112 mEq/L   CO2 25  19 - 32 mEq/L   Glucose, Bld 97  70 - 99 mg/dL   BUN 25 (*) 6 - 23 mg/dL   Creatinine, Ser 1.61 (*) 0.50 - 1.10 mg/dL   Calcium 9.5  8.4 - 09.6 mg/dL   GFR calc non Af Amer 20 (*) >90 mL/min   GFR calc Af Amer 23 (*) >90 mL/min   Comment:            The eGFR has been calculated     using the CKD EPI equation.     This calculation has not been     validated in all clinical     situations.     eGFR's persistently     <90 mL/min signify     possible Chronic Kidney Disease.  LACTIC ACID, PLASMA     Status: None   Collection Time    07/30/12 12:46 AM      Result Value Range   Lactic Acid, Venous 1.2  0.5 - 2.2 mmol/L  MRSA PCR SCREENING     Status: None   Collection Time    07/30/12  2:49 AM      Result Value Range   MRSA by PCR NEGATIVE  NEGATIVE   Comment:            The GeneXpert MRSA Assay (FDA     approved for NASAL specimens     only), is one component of a  comprehensive MRSA colonization     surveillance program. It is not     intended to diagnose MRSA     infection nor to guide or     monitor treatment for     MRSA infections.  BLOOD GAS, ARTERIAL     Status: Abnormal   Collection Time    07/30/12   3:08 AM      Result Value Range   O2 Content 2.0     Delivery systems NASAL CANNULA     pH, Arterial 7.290 (*) 7.350 - 7.450   pCO2 arterial 49.4 (*) 35.0 - 45.0 mmHg   pO2, Arterial 80.7  80.0 - 100.0 mmHg   Bicarbonate 23.0  20.0 - 24.0 mEq/L   TCO2 21.9  0 - 100 mmol/L   Acid-base deficit 3.1 (*) 0.0 - 2.0 mmol/L   O2 Saturation 95.5     Patient temperature 37.0     Collection site LEFT RADIAL     Drawn by [id number]     Sample type ARTERIAL DRAW     Allens test (pass/fail) PASS  PASS  COMPREHENSIVE METABOLIC PANEL     Status: Abnormal   Collection Time    07/30/12  3:33 AM      Result Value Range   Sodium 141  135 - 145 mEq/L   Potassium 4.7  3.5 - 5.1 mEq/L   Chloride 109  96 - 112 mEq/L   CO2 23  19 - 32 mEq/L   Glucose, Bld 90  70 - 99 mg/dL   BUN 25 (*) 6 - 23 mg/dL   Creatinine, Ser 9.14 (*) 0.50 - 1.10 mg/dL   Calcium 9.3  8.4 - 78.2 mg/dL   Total Protein 6.4  6.0 - 8.3 g/dL   Albumin 2.6 (*) 3.5 - 5.2 g/dL   AST 12  0 - 37 U/L   ALT 6  0 - 35 U/L   Alkaline Phosphatase 48  39 - 117 U/L   Total Bilirubin 0.1 (*) 0.3 - 1.2 mg/dL   GFR calc non Af Amer 21 (*) >90 mL/min   GFR calc Af Amer 24 (*) >90 mL/min   Comment:            The eGFR has been calculated     using the CKD EPI equation.     This calculation has not been     validated in all clinical     situations.     eGFR's persistently     <90 mL/min signify     possible Chronic Kidney Disease.  CBC     Status: Abnormal   Collection Time    07/30/12  3:33 AM      Result Value Range   WBC 6.7  4.0 - 10.5 K/uL   RBC 3.21 (*) 3.87 - 5.11 MIL/uL   Hemoglobin 9.7 (*) 12.0 - 15.0 g/dL   HCT 95.6 (*) 21.3 - 08.6 %   MCV 94.1  78.0 - 100.0 fL   MCH 30.2  26.0 - 34.0 pg   MCHC 32.1  30.0 - 36.0 g/dL   RDW 57.8 (*) 46.9 - 62.9 %   Platelets 217  150 - 400 K/uL  VALPROIC ACID LEVEL     Status: Abnormal   Collection Time    07/30/12  3:33 AM      Result Value Range   Valproic Acid Lvl 122.5 (*) 50.0 - 100.0  ug/mL  GLUCOSE, CAPILLARY     Status: None  Collection Time    07/30/12  8:09 AM      Result Value Range   Glucose-Capillary 71  70 - 99 mg/dL   Comment 1 Notify RN     Comment 2 Documented in Chart    AMMONIA     Status: None   Collection Time    07/30/12 12:32 PM      Result Value Range   Ammonia 38  11 - 60 umol/L  BASIC METABOLIC PANEL     Status: Abnormal   Collection Time    07/30/12 12:32 PM      Result Value Range   Sodium 146 (*) 135 - 145 mEq/L   Potassium 4.0  3.5 - 5.1 mEq/L   Chloride 112  96 - 112 mEq/L   CO2 23  19 - 32 mEq/L   Glucose, Bld 142 (*) 70 - 99 mg/dL   BUN 25 (*) 6 - 23 mg/dL   Creatinine, Ser 1.61 (*) 0.50 - 1.10 mg/dL   Calcium 9.2  8.4 - 09.6 mg/dL   GFR calc non Af Amer 19 (*) >90 mL/min   GFR calc Af Amer 22 (*) >90 mL/min   Comment:            The eGFR has been calculated     using the CKD EPI equation.     This calculation has not been     validated in all clinical     situations.     eGFR's persistently     <90 mL/min signify     possible Chronic Kidney Disease.  VALPROIC ACID LEVEL     Status: Abnormal   Collection Time    07/30/12 12:33 PM      Result Value Range   Valproic Acid Lvl 125.4 (*) 50.0 - 100.0 ug/mL  LACTIC ACID, PLASMA     Status: None   Collection Time    07/30/12 12:33 PM      Result Value Range   Lactic Acid, Venous 2.0  0.5 - 2.2 mmol/L  GLUCOSE, CAPILLARY     Status: Abnormal   Collection Time    07/30/12 12:36 PM      Result Value Range   Glucose-Capillary 128 (*) 70 - 99 mg/dL   Comment 1 Documented in Chart     Comment 2 Notify RN    CALCIUM, IONIZED     Status: Abnormal   Collection Time    07/30/12 12:40 PM      Result Value Range   Calcium, Ion 1.42 (*) 1.12 - 1.23 mmol/L  GLUCOSE, CAPILLARY     Status: Abnormal   Collection Time    07/30/12  5:08 PM      Result Value Range   Glucose-Capillary 100 (*) 70 - 99 mg/dL   Comment 1 Documented in Chart     Comment 2 Notify RN    GLUCOSE, CAPILLARY      Status: None   Collection Time    07/30/12  8:07 PM      Result Value Range   Glucose-Capillary 91  70 - 99 mg/dL   Comment 1 Documented in Chart     Comment 2 Notify RN    VALPROIC ACID LEVEL     Status: Abnormal   Collection Time    07/30/12  8:10 PM      Result Value Range   Valproic Acid Lvl 106.6 (*) 50.0 - 100.0 ug/mL  COMPREHENSIVE METABOLIC PANEL     Status: Abnormal  Collection Time    07/30/12  8:19 PM      Result Value Range   Sodium 144  135 - 145 mEq/L   Potassium 4.0  3.5 - 5.1 mEq/L   Chloride 107  96 - 112 mEq/L   CO2 24  19 - 32 mEq/L   Glucose, Bld 113 (*) 70 - 99 mg/dL   BUN 33 (*) 6 - 23 mg/dL   Creatinine, Ser 1.61 (*) 0.50 - 1.10 mg/dL   Calcium 9.5  8.4 - 09.6 mg/dL   Total Protein 7.2  6.0 - 8.3 g/dL   Albumin 2.9 (*) 3.5 - 5.2 g/dL   AST 10  0 - 37 U/L   ALT 6  0 - 35 U/L   Alkaline Phosphatase 52  39 - 117 U/L   Total Bilirubin 0.1 (*) 0.3 - 1.2 mg/dL   GFR calc non Af Amer 18 (*) >90 mL/min   GFR calc Af Amer 21 (*) >90 mL/min   Comment:            The eGFR has been calculated     using the CKD EPI equation.     This calculation has not been     validated in all clinical     situations.     eGFR's persistently     <90 mL/min signify     possible Chronic Kidney Disease.  GLUCOSE, CAPILLARY     Status: Abnormal   Collection Time    07/30/12 11:37 PM      Result Value Range   Glucose-Capillary 140 (*) 70 - 99 mg/dL   Comment 1 Documented in Chart     Comment 2 Notify RN    CBC     Status: Abnormal   Collection Time    07/31/12  3:35 AM      Result Value Range   WBC 7.2  4.0 - 10.5 K/uL   RBC 2.96 (*) 3.87 - 5.11 MIL/uL   Hemoglobin 9.2 (*) 12.0 - 15.0 g/dL   HCT 04.5 (*) 40.9 - 81.1 %   MCV 92.9  78.0 - 100.0 fL   MCH 31.1  26.0 - 34.0 pg   MCHC 33.5  30.0 - 36.0 g/dL   RDW 91.4  78.2 - 95.6 %   Platelets 206  150 - 400 K/uL  COMPREHENSIVE METABOLIC PANEL     Status: Abnormal   Collection Time    07/31/12  3:35 AM       Result Value Range   Sodium 139  135 - 145 mEq/L   Potassium 3.9  3.5 - 5.1 mEq/L   Chloride 104  96 - 112 mEq/L   CO2 24  19 - 32 mEq/L   Glucose, Bld 187 (*) 70 - 99 mg/dL   BUN 38 (*) 6 - 23 mg/dL   Creatinine, Ser 2.13 (*) 0.50 - 1.10 mg/dL   Calcium 9.4  8.4 - 08.6 mg/dL   Total Protein 6.3  6.0 - 8.3 g/dL   Albumin 2.7 (*) 3.5 - 5.2 g/dL   AST 10  0 - 37 U/L   ALT 6  0 - 35 U/L   Alkaline Phosphatase 48  39 - 117 U/L   Total Bilirubin 0.1 (*) 0.3 - 1.2 mg/dL   GFR calc non Af Amer 18 (*) >90 mL/min   GFR calc Af Amer 21 (*) >90 mL/min   Comment:            The eGFR has  been calculated     using the CKD EPI equation.     This calculation has not been     validated in all clinical     situations.     eGFR's persistently     <90 mL/min signify     possible Chronic Kidney Disease.  GLUCOSE, CAPILLARY     Status: Abnormal   Collection Time    07/31/12  8:06 AM      Result Value Range   Glucose-Capillary 135 (*) 70 - 99 mg/dL   Comment 1 Documented in Chart     Comment 2 Notify RN    GLUCOSE, CAPILLARY     Status: Abnormal   Collection Time    07/31/12 12:33 PM      Result Value Range   Glucose-Capillary 147 (*) 70 - 99 mg/dL   Comment 1 Documented in Chart     Comment 2 Notify RN      Dg Chest 2 View  07/30/2012  *RADIOLOGY REPORT*  Clinical Data: Drug overdose.  Short of breath.  CHEST - 2 VIEW  Comparison: None.  Findings: Low volume chest.  Bilateral basilar atelectasis.  No definite consolidation.  No pleural effusion.  The low volumes accentuate the cardiopericardial silhouette. Monitoring leads are projected over the chest.  IMPRESSION: Low volume chest.   Original Report Authenticated By: Andreas Newport, M.D.    Ct Head Wo Contrast  07/29/2012  *RADIOLOGY REPORT*  Clinical Data:  Fall. overdose.  No unresponsive patient.  CT HEAD WITHOUT CONTRAST CT CERVICAL SPINE WITHOUT CONTRAST  Technique:  Multidetector CT imaging of the head and cervical spine was performed  following the standard protocol without intravenous contrast.  Multiplanar CT image reconstructions of the cervical spine were also generated.  Comparison:  02/20/2012.  CT HEAD  Findings: No mass lesion, mass effect, midline shift, hydrocephalus, hemorrhage.  No territorial ischemia or acute infarction.  Calvarium intact.  Paranasal sinuses demonstrate mucous retention cyst or polyp in the posterior aspect of the left maxillary sinus.  Mastoid air cells are clear.  IMPRESSION: Negative CT head.  CT CERVICAL SPINE  Findings: Mild cervical spondylosis is present.  There is no cervical spine fracture or dislocation.  There is mild motion artifact which degrades the study, particularly on the reconstructed sagittal images.  Dextroconvex torticollis is present.  Thyromegaly, most compatible with goiter. The dependent atelectasis in the lung apices.  Mastoid air cells are clear.  Craniocervical alignment and occipital condyles appear normal.  The odontoid intact.  Prevertebral soft tissues appear within normal limits.  The 2 mm retrolisthesis of C4 on C5 appears degenerative.  Some of this may also be accounted for by motion artifact.  C5-C6 and C6-C7 predominant cervical spondylosis with bilateral uncovertebral spurring and foraminal encroachment. Patulous esophagus.  Carotid atherosclerosis.  IMPRESSION: No acute osseous abnormality.  No cervical spine fracture or dislocation.  Study mildly degraded by motion artifact.   Original Report Authenticated By: Andreas Newport, M.D.    Ct Cervical Spine Wo Contrast  07/29/2012  *RADIOLOGY REPORT*  Clinical Data:  Fall. overdose.  No unresponsive patient.  CT HEAD WITHOUT CONTRAST CT CERVICAL SPINE WITHOUT CONTRAST  Technique:  Multidetector CT imaging of the head and cervical spine was performed following the standard protocol without intravenous contrast.  Multiplanar CT image reconstructions of the cervical spine were also generated.  Comparison:  02/20/2012.  CT HEAD   Findings: No mass lesion, mass effect, midline shift, hydrocephalus, hemorrhage.  No territorial ischemia or  acute infarction.  Calvarium intact.  Paranasal sinuses demonstrate mucous retention cyst or polyp in the posterior aspect of the left maxillary sinus.  Mastoid air cells are clear.  IMPRESSION: Negative CT head.  CT CERVICAL SPINE  Findings: Mild cervical spondylosis is present.  There is no cervical spine fracture or dislocation.  There is mild motion artifact which degrades the study, particularly on the reconstructed sagittal images.  Dextroconvex torticollis is present.  Thyromegaly, most compatible with goiter. The dependent atelectasis in the lung apices.  Mastoid air cells are clear.  Craniocervical alignment and occipital condyles appear normal.  The odontoid intact.  Prevertebral soft tissues appear within normal limits.  The 2 mm retrolisthesis of C4 on C5 appears degenerative.  Some of this may also be accounted for by motion artifact.  C5-C6 and C6-C7 predominant cervical spondylosis with bilateral uncovertebral spurring and foraminal encroachment. Patulous esophagus.  Carotid atherosclerosis.  IMPRESSION: No acute osseous abnormality.  No cervical spine fracture or dislocation.  Study mildly degraded by motion artifact.   Original Report Authenticated By: Andreas Newport, M.D.     Positive for anxiety, bad mood, bipolar, mood swings and sleep disturbance Blood pressure 112/73, pulse 73, temperature 98.8 F (37.1 C), temperature source Oral, resp. rate 18, height 5\' 3"  (1.6 m), weight 198 lb 6.6 oz (90 kg), SpO2 98.00%.   Assessment/Plan: Bipolar disorder NOs Polysubstance abuse vs dependence  Recommendation: Patient does not meet criteria for acute psychiatric hospitalization as the patient has been contracting for safety and adamantly denied suicidal onset ideation. No psychotic symptoms. Discontinue sitter at bedside. Patient will be referred with outpatient psychiatric services  with Dr. Jannifer Lawson as scheduled. May contact social services see for father for as needed. No medication changes were recommended at this time. Appreciate psychiatric consultation and will sign off.  Joran Kallal,JANARDHAHA R. 07/31/2012, 5:00 PM

## 2012-07-31 NOTE — Progress Notes (Signed)
PULMONARY  / CRITICAL CARE MEDICINE  Name: Jennifer Lawson MRN: [id number] DOB: 1961/03/03    ADMISSION DATE:  07/29/2012 CONSULTATION DATE:  3/24  REFERRING MD :  vann  CHIEF COMPLAINT:   Polysubstance overdose, altered mental status.   BRIEF PATIENT DESCRIPTION:  This is a 52 year old female who was admitted on 3/24 s/p poly-substance overdose which included: Norvasc, clonidine, metoprolol, Depakote, Lunesta, Lamictal, BuSpar, Wellbutrin, Geodon. Admitted to the ICU. PCCM asked to eval given concern for possible clinical decline.  SIGNIFICANT EVENTS / STUDIES:  CT C spine 3/24: No acute osseous abnormality. No cervical spine fracture or dislocation. Study mildly degraded by motion artifact.  CT head 3/24: neg   LINES / TUBES: PIV CULTURES: none  ANTIBIOTICS: none    SUBJECTIVE:  Agitated at times  VITAL SIGNS: Temp:  [97.4 F (36.3 C)-99.1 F (37.3 C)] 98.8 F (37.1 C) (03/25 0814) Pulse Rate:  [55-79] 67 (03/25 1000) Resp:  [9-26] 18 (03/25 1000) BP: (78-124)/(39-74) 110/72 mmHg (03/25 1000) SpO2:  [95 %-100 %] 95 % (03/25 1000) Weight:  [90 kg (198 lb 6.6 oz)] 90 kg (198 lb 6.6 oz) (03/25 0500)    INTAKE / OUTPUT: Intake/Output     03/24 0701 - 03/25 0700 03/25 0701 - 03/26 0700   P.O. 2520    I.V. (mL/kg) 2893 (32.1) 260 (2.9)   IV Piggyback 1025    Total Intake(mL/kg) 6438 (71.5) 260 (2.9)   Urine (mL/kg/hr) 7550 (3.5)    Emesis/NG output 750 (0.3)    Total Output 8300     Net -1862 +260          PHYSICAL EXAMINATION: General:  Awake, agitated but oriented  Neuro:  Confused no focal def  HEENT:  edentulous no JVD  Cardiovascular:  rrr Lungs: decreased  Abdomen:  Intact  Musculoskeletal:  Intact  Skin:  Intact   LABS:  Recent Labs Lab 07/30/12 1232 07/30/12 2019 07/31/12 0335  NA 146* 144 139  K 4.0 4.0 3.9  CL 112 107 104  CO2 23 24 24   BUN 25* 33* 38*  CREATININE 2.70* 2.84* 2.78*  GLUCOSE 142* 113* 187*    Recent Labs Lab  07/29/12 2026 07/30/12 0333 07/31/12 0335  HGB 10.2* 9.7* 9.2*  HCT 32.4* 30.2* 27.5*  WBC 6.7 6.7 7.2  PLT 231 217 206    Recent Labs Lab 07/30/12 0333 07/30/12 2019 07/31/12 0335  ALT 6 6 6   AST 12 10 10   ALKPHOS 48 52 48  BILITOT 0.1* 0.1* 0.1*     Recent Labs Lab 07/30/12 0809 07/30/12 1236 07/30/12 1708 07/30/12 2337 07/31/12 0806  GLUCAP 71 128* 100* 140* 135*    CXR: low volume. RLL atx vs early infiltrate   ASSESSMENT / PLAN: Active Problems:   Drug overdose   Bipolar disorder   HTN (hypertension)   PULMONARY A: no acute issue  P:   Supportive care   CARDIOVASCULAR A: circulatory shock/hypotension(resolved). Likely residual effect from antihypertensives and sedating meds. Doing better. Hemodynamically resolved.  Plan Cont to hold antihypertensives Dc glucagon    RENAL A:  CKD  Scr currently at baseline 2.52 P:   IVFs Hold all antihypertensives  GASTROINTESTINAL A:  No acute issue. Did have depakote included in her OD  >LFT and ammonia levels NML P:   Advance diet   HEMATOLOGIC A:  Anemia of chronic disease.  P:  Trend CBC   INFECTIOUS A:  No clear evidence of infection  P:  Trend CBC   ENDOCRINE A:  DM  P:  ssi   NEUROLOGIC A:  Acute encephalopathy in setting of OD She is oriented, but agitated at times.  P:   Supportive care.  Will ask for psych eval  Repeat depakote level  TODAY'S SUMMARY: 37 yof w/ ams d/t polysubstance od. Will cont supportive care as outlined above. Will transfer to psych . I am ok with tfr to floor with sitter and back to [facility] and PCCM is OFF.  I have personally obtained a history, examined the patient, evaluated laboratory and imaging results, formulated the assessment and plan and placed orders. CRITICAL CARE: The patient is critically ill with multiple organ systems failure and requires high complexity decision making for assessment and support, frequent evaluation and titration of therapies,  application of advanced monitoring technologies and extensive interpretation of multiple databases. Critical Care Time devoted to patient care services described in this note is .   Dorcas Carrow Beeper  [phone]  Cell  [phone]  If no response or cell goes to voicemail, call beeper [phone]  07/31/2012, 10:25 AM

## 2012-07-31 NOTE — Progress Notes (Signed)
Clinical Social Work Department CLINICAL SOCIAL WORK PSYCHIATRY SERVICE LINE ASSESSMENT 07/31/2012  Patient:  Jennifer Lawson  Account:  [id number]  Admit Date:  07/29/2012  Clinical Social Worker:  Jennifer Lightning, LCSW  Date/Time:  07/31/2012 03:30 PM Referred by:  Physician  Date referred:  07/31/2012 Reason for Referral  Psychosocial assessment   Presenting Symptoms/Problems (In the person's/family's own words):   Psych consulted due to possible overdose   Abuse/Neglect/Trauma History (check all that apply)  Denies history   Abuse/Neglect/Trauma Comments:   Patient refuses to answer any questions regarding abuse.   Psychiatric History (check all that apply)  Outpatient treatment   Psychiatric medications:  Buspar 10 mg  Depakote 500 mg  Vistaril 50 mg  Remeron 15 mg  Wellbutrin 150mg   Lamictal 25 mg  Geodon 60 mg   Current Mental Health Hospitalizations/Previous Mental Health History:   Patient reports she was diagnosed with depression. Per chart review, patient also has a history of bipolar.   Current provider:   Dr. Jenne Lawson and Date:   [facility]   Current Medications:   acetaminophen            . heparin  5,000 Units Subcutaneous Q8H  . insulin aspart  0-15 Units Subcutaneous TID WC  . insulin aspart  0-5 Units Subcutaneous QHS   Previous Impatient Admission/Date/Reason:   Patient denies any hospitalizations. Per chart review, no [facility] stays.   Emotional Health / Current Symptoms    Suicide/Self Harm  Other harmful behavior (ex: homicidal ideation) (describe)   Suicide attempt in the past:   Patient denies that this admission was a suicide attempt. Patient reports she was trying to get some rest and just wanted to sleep. Patient denies any current SI or HI at this time and denies any prior attempts.   Other harmful behavior:   Patient appears impulsive and per chart review, patient attempted to overdose less than 6 months ago.    Psychotic/Dissociative Symptoms  None reported   Other Psychotic/Dissociative Symptoms:   Patient denies any psychotic features.    Attention/Behavioral Symptoms  Inattentive  Withdrawn   Other Attention / Behavioral Symptoms:   Patient reports "you ask too many questions. If you aren't a doctor then I don't want to tell you."    Cognitive Impairment  Unable to accurately assess   Other Cognitive Impairment:    Mood and Adjustment  Guarded    Stress, Anxiety, Trauma, Any Recent Loss/Stressor  None reported   Anxiety (frequency):   Phobia (specify):   Compulsive behavior (specify):   Obsessive behavior (specify):   Other:   Substance Abuse/Use  None   SBIRT completed (please refer for detailed history):  N  Self-reported substance use:   Patient denies all substance use.   Urinary Drug Screen Completed:  Y Alcohol level:   Negative screen    Environmental/Housing/Living Arrangement  With Family Member   Who is in the home:   Jennifer Lawson and Jennifer Lawson's family.   Emergency contact:  Jennifer Lawson-Jennifer Lawson   Financial  Medicare  Medicaid   Patient's Strengths and Goals (patient's own words):   Patient reports that her Jennifer Lawson loves her and assists her   Clinical Social Worker's Interpretive Summary:   CSW received referral to complete psychosocial assessment. CSW reviewed chart and met with patient at bedside. No visitors present.    CSW introduced myself and explained role. Patient reports that she lives with Jennifer Lawson and Jennifer Lawson's 49 month old son. Patient reports that  she had to come to the hospital after taking an overdose of medication. Patient reports she cannot remember how many pills she took or what medication she ingested. Patient reports that she was not trying to kill herself but was tired and needed to sleep. Patient reports difficulty in sleeping and thought taking medications would help.    Patient reports that she sees a psychiatrist in the community and reports she is  compliant with medications. Patient is able to offer little detail on MH history. Patient denies all substance use.    Patient reports that she does not work and stays at home during the day. Patient enjoys watching tv, playing with her grandchildren and reading. Patient has three dtrs and 1 stepson and 11 grandchildren. Patient reports that she has the same routine everyday and wants to return home.    CSW tried to gather further information but patient guarded and refusing to offer information. Patient does not want to speak with CSW since CSW "is not a doctor." CSW asked permission to speak with family to get collateral information but patient refuses. Per chart review, patient has attempted to overdose in the past.    CSW will staff case with psych MD and follow any recommendations provided.   Disposition:  Recommend Psych CSW continuing to support while in hospital

## 2012-07-31 NOTE — Progress Notes (Signed)
eLink Physician-Brief Progress Note Patient Name: Jennifer Lawson DOB: [birth year] MRN: [id number]  Date of Service  07/31/2012   HPI/Events of Note  Patient is recent drug OD, now HD stable/alert.  LFTs are WNL.  C/O of headache and requesting something for HA  eICU Interventions  Plan: Tylenol 650 mg po q6 hours prn pain.   Intervention Category Minor Interventions: Routine modifications to care plan (e.g. PRN medications for pain, fever)  DETERDING,ELIZABETH 07/31/2012, 12:52 AM

## 2012-08-01 DIAGNOSIS — I10 Essential (primary) hypertension: Secondary | ICD-10-CM

## 2012-08-01 LAB — BASIC METABOLIC PANEL
BUN: 45 mg/dL — ABNORMAL HIGH (ref 6–23)
CO2: 21 mEq/L (ref 19–32)
Calcium: 11.2 mg/dL — ABNORMAL HIGH (ref 8.4–10.5)
Chloride: 104 mEq/L (ref 96–112)
Creatinine, Ser: 2.78 mg/dL — ABNORMAL HIGH (ref 0.50–1.10)
GFR calc Af Amer: 21 mL/min — ABNORMAL LOW (ref >90)
GFR calc non Af Amer: 18 mL/min — ABNORMAL LOW (ref >90)
Glucose, Bld: 62 mg/dL — ABNORMAL LOW (ref 70–99)
Potassium: 4.6 mEq/L (ref 3.5–5.1)
Sodium: 136 mEq/L (ref 135–145)

## 2012-08-01 LAB — GLUCOSE, CAPILLARY
Glucose-Capillary: 66 mg/dL — ABNORMAL LOW (ref 70–99)
Glucose-Capillary: 69 mg/dL — ABNORMAL LOW (ref 70–99)
Glucose-Capillary: 86 mg/dL (ref 70–99)
Glucose-Capillary: 94 mg/dL (ref 70–99)
Glucose-Capillary: 93 mg/dL (ref 70–99)

## 2012-08-01 LAB — VALPROIC ACID LEVEL: Valproic Acid Lvl: 28.2 ug/mL — ABNORMAL LOW (ref 50.0–100.0)

## 2012-08-01 MED ORDER — DIVALPROEX SODIUM 500 MG PO DR TAB
500.0000 mg | DELAYED_RELEASE_TABLET | Freq: Two times a day (BID) | ORAL | Status: DC
  Administered 2012-08-01: 500 mg via ORAL
  Filled 2012-08-01 (×2): qty 1

## 2012-08-01 MED ORDER — AMLODIPINE BESYLATE 5 MG PO TABS
5.0000 mg | ORAL_TABLET | Freq: Every day | ORAL | Status: DC
  Administered 2012-08-01: 5 mg via ORAL
  Filled 2012-08-01: qty 1

## 2012-08-01 MED ORDER — CLONIDINE HCL 0.1 MG PO TABS
0.1000 mg | ORAL_TABLET | Freq: Two times a day (BID) | ORAL | Status: DC
  Administered 2012-08-01: 0.1 mg via ORAL
  Filled 2012-08-01 (×2): qty 1

## 2012-08-01 MED ORDER — MIRTAZAPINE 15 MG PO TABS
15.0000 mg | ORAL_TABLET | Freq: Every day | ORAL | Status: DC
  Filled 2012-08-01: qty 1

## 2012-08-01 MED ORDER — METOPROLOL SUCCINATE ER 50 MG PO TB24
50.0000 mg | ORAL_TABLET | Freq: Every day | ORAL | Status: DC
  Administered 2012-08-01: 50 mg via ORAL
  Filled 2012-08-01: qty 1

## 2012-08-01 MED ORDER — ZIPRASIDONE HCL 60 MG PO CAPS
120.0000 mg | ORAL_CAPSULE | Freq: Every day | ORAL | Status: DC
  Filled 2012-08-01: qty 2

## 2012-08-01 NOTE — Progress Notes (Signed)
Clinical Social Work Progress Note PSYCHIATRY SERVICE LINE 08/01/2012  Patient:  Jennifer Lawson  Account:  [id number]  Admit Date:  07/29/2012  Clinical Social Worker:  Unk Lightning, LCSW  Date/Time:  08/01/2012 12:00 N  Review of Patient  Overall Medical Condition:   Patient reports she needs her medication and that she is ready to dc.   Participation Level:  Minimal  Participation Quality  Guarded   Other Participation Quality:   Patient agreeable to CSW calling dtr to be involved with conversation. Dtr on speaker phone.   Affect  Flat   Cognitive  Alert   Reaction to Medications/Concerns:   Per RN, MD to restart psychotropic medications.   Modes of Intervention  Support  Problem-solving   Summary of Progress/Plan at Discharge   CSW met with patient at bedside. Patient laying in bed and reports she needs to call dtr. CSW suggested that CSW call dtr and place her on speaker phone. Patient agreeable to plan.    Dtr reports that patient has lived with her since January 2013. Dtr reports that she was agreeable to patient living with her on the condition that patient was compliant with medications and appointments. Dtr reports that patient [facility] but patient refused to continue working with them. Dtr reports that services in the home were helpful and patient needs assistance to be more independent and to be compliant with treatment. Patient agreeable for CSW to send referral to [facility]. Patient signed ROI and CSW sent referral.    Dtr reports concern for patient's behavior regarding a recent medication change throughout community psychiatrist. Patient struggled when taking Ambien in the past and had a similar episode. Dtr is also worried about patient taking Chantax. Patient went to outpatient appointment about 1.5 weeks ago. CSW suggested that patient follow up with psychiatrist and scheduled a follow up  appointment for her on 08/10/12 at 2:10pm.    CSW will continue to follow.

## 2012-08-01 NOTE — Progress Notes (Signed)
Patient drunk more orange juice and some milk (about total) and ate a few bites of breakfast. Blood sugar now 86. Will continue to monitor

## 2012-08-01 NOTE — Progress Notes (Signed)
Pt blood sugar 69, gave orange juice and rechecked in 15 mins, now blood sugar 66. Patient breakfast in room, encouraged pt to eat breakfast will continue to monitor.

## 2012-08-01 NOTE — Progress Notes (Signed)
Patient discharged.  Discharge instructions given to patient and daughter.  No further questions at this time.

## 2012-08-10 DIAGNOSIS — F4310 Post-traumatic stress disorder, unspecified: Secondary | ICD-10-CM | POA: Diagnosis not present

## 2012-08-10 DIAGNOSIS — F3132 Bipolar disorder, current episode depressed, moderate: Secondary | ICD-10-CM | POA: Diagnosis not present

## 2012-08-10 DIAGNOSIS — F29 Unspecified psychosis not due to a substance or known physiological condition: Secondary | ICD-10-CM | POA: Diagnosis not present

## 2012-08-22 NOTE — Discharge Summary (Signed)
Physician Discharge Summary  Jennifer Lawson ZOX:[id number] DOB: 09-26-1960 DOA: 07/29/2012  PCP: No primary provider on file.  Admit date: 07/29/2012 Discharge date:08/01/2012  Time spent:45 minutes  Recommendations for Outpatient Follow-up:  1. Outpatient Psychiatrist on 08/10/12  Discharge Diagnoses:  Active Problems:   Drug overdose   Bipolar disorder   HTN (hypertension)   Discharge Condition: stable  Diet recommendation: low Na  Filed Weights   07/30/12 0215 07/30/12 0439 07/31/12 0500  Weight: 88.1 kg (194 lb 3.6 oz) 88.1 kg (194 lb 3.6 oz) 90 kg (198 lb 6.6 oz)    History of present illness:  This is a 52 y.o. year old female with past medical history of bipolar disorder, drug overdose with recent admission October 2013, stage IV CKD presenting with fall and altered mental status status post intentional drug overdose. Per her report, patient was an argument with her daughter when her daughter witnessed patient taking a weeks worth of her medications from her pillbox all at once.   Hospital Course:  She was admitted with AMS from drug overdose and also was hypotensive on admission from taking antihypertensives. She was treated with supportive care, did not require pressors and mentation stabilized and improved after 24hours. She was seen by Psychiatry in consultation and they recommended Outpatient psychiatric services only and this was set up by the social worker prior to discharge  Consultations:  PSychiatry  Discharge Exam: Filed Vitals:   07/31/12 1528 07/31/12 2054 08/01/12 0537 08/01/12 1354  BP: 112/73 105/67 128/84 132/80  Pulse: 73 71 79 75  Temp: 98.8 F (37.1 C) 98 F (36.7 C) 97.3 F (36.3 C) 98 F (36.7 C)  TempSrc: Oral Oral Oral Oral  Resp: 18 18 18 19   Height:      Weight:      SpO2: 98% 94% 100% 98%    General: AAOx3 Cardiovascular:S1S2/RRR Respiratory: CTAB Discharge Instructions  Discharge Orders   Future Orders Complete By Expires      Diet - low sodium heart healthy  As directed     Increase activity slowly  As directed         Medication List    TAKE these medications       amLODipine 10 MG tablet  Commonly known as:  NORVASC  Take 10 mg by mouth daily.     cloNIDine 0.1 MG tablet  Commonly known as:  CATAPRES  Take 0.1 mg by mouth 2 (two) times daily.     divalproex 500 MG DR tablet  Commonly known as:  DEPAKOTE  Take 500 mg by mouth 2 (two) times daily.     furosemide 40 MG tablet  Commonly known as:  LASIX  Take 40 mg by mouth daily.     metoprolol succinate 100 MG 24 hr tablet  Commonly known as:  TOPROL-XL  Take 100 mg by mouth daily. Take with or immediately following a meal.     mirtazapine 15 MG tablet  Commonly known as:  REMERON  Take 15 mg by mouth at bedtime. At 9pm     ziprasidone 60 MG capsule  Commonly known as:  GEODON  Take 120 mg by mouth at bedtime.     zolpidem 10 MG tablet  Commonly known as:  AMBIEN  Take 10 mg by mouth at bedtime as needed for sleep.           Follow-up Information   Follow up with [facility]. (Appointment scheduled on Friday, 08/10/12 at 2:10pm)  Contact information:   [address] [place], [place] [address] [phone]        The results of significant diagnostics from this hospitalization (including imaging, microbiology, ancillary and laboratory) are listed below for reference.    Significant Diagnostic Studies: Dg Chest 2 View  07/30/2012  *RADIOLOGY REPORT*  Clinical Data: Drug overdose.  Short of breath.  CHEST - 2 VIEW  Comparison: None.  Findings: Low volume chest.  Bilateral basilar atelectasis.  No definite consolidation.  No pleural effusion.  The low volumes accentuate the cardiopericardial silhouette. Monitoring leads are projected over the chest.  IMPRESSION: Low volume chest.   Original Report Authenticated By: Andreas Newport, M.D.    Ct Head Wo Contrast  07/29/2012  *RADIOLOGY  REPORT*  Clinical Data:  Fall. overdose.  No unresponsive patient.  CT HEAD WITHOUT CONTRAST CT CERVICAL SPINE WITHOUT CONTRAST  Technique:  Multidetector CT imaging of the head and cervical spine was performed following the standard protocol without intravenous contrast.  Multiplanar CT image reconstructions of the cervical spine were also generated.  Comparison:  02/20/2012.  CT HEAD  Findings: No mass lesion, mass effect, midline shift, hydrocephalus, hemorrhage.  No territorial ischemia or acute infarction.  Calvarium intact.  Paranasal sinuses demonstrate mucous retention cyst or polyp in the posterior aspect of the left maxillary sinus.  Mastoid air cells are clear.  IMPRESSION: Negative CT head.  CT CERVICAL SPINE  Findings: Mild cervical spondylosis is present.  There is no cervical spine fracture or dislocation.  There is mild motion artifact which degrades the study, particularly on the reconstructed sagittal images.  Dextroconvex torticollis is present.  Thyromegaly, most compatible with goiter. The dependent atelectasis in the lung apices.  Mastoid air cells are clear.  Craniocervical alignment and occipital condyles appear normal.  The odontoid intact.  Prevertebral soft tissues appear within normal limits.  The 2 mm retrolisthesis of C4 on C5 appears degenerative.  Some of this may also be accounted for by motion artifact.  C5-C6 and C6-C7 predominant cervical spondylosis with bilateral uncovertebral spurring and foraminal encroachment. Patulous esophagus.  Carotid atherosclerosis.  IMPRESSION: No acute osseous abnormality.  No cervical spine fracture or dislocation.  Study mildly degraded by motion artifact.   Original Report Authenticated By: Andreas Newport, M.D.    Ct Cervical Spine Wo Contrast  07/29/2012  *RADIOLOGY REPORT*  Clinical Data:  Fall. overdose.  No unresponsive patient.  CT HEAD WITHOUT CONTRAST CT CERVICAL SPINE WITHOUT CONTRAST  Technique:  Multidetector CT imaging of the head and  cervical spine was performed following the standard protocol without intravenous contrast.  Multiplanar CT image reconstructions of the cervical spine were also generated.  Comparison:  02/20/2012.  CT HEAD  Findings: No mass lesion, mass effect, midline shift, hydrocephalus, hemorrhage.  No territorial ischemia or acute infarction.  Calvarium intact.  Paranasal sinuses demonstrate mucous retention cyst or polyp in the posterior aspect of the left maxillary sinus.  Mastoid air cells are clear.  IMPRESSION: Negative CT head.  CT CERVICAL SPINE  Findings: Mild cervical spondylosis is present.  There is no cervical spine fracture or dislocation.  There is mild motion artifact which degrades the study, particularly on the reconstructed sagittal images.  Dextroconvex torticollis is present.  Thyromegaly, most compatible with goiter. The dependent atelectasis in the lung apices.  Mastoid air cells are clear.  Craniocervical alignment and occipital condyles appear normal.  The odontoid intact.  Prevertebral soft tissues appear within normal limits.  The  2 mm retrolisthesis of C4 on C5 appears degenerative.  Some of this may also be accounted for by motion artifact.  C5-C6 and C6-C7 predominant cervical spondylosis with bilateral uncovertebral spurring and foraminal encroachment. Patulous esophagus.  Carotid atherosclerosis.  IMPRESSION: No acute osseous abnormality.  No cervical spine fracture or dislocation.  Study mildly degraded by motion artifact.   Original Report Authenticated By: Andreas Newport, M.D.     Microbiology: No results found for this or any previous visit (from the past 240 hour(s)).   Labs: Basic Metabolic Panel: No results found for this basename: NA, K, CL, CO2, GLUCOSE, BUN, CREATININE, CALCIUM, MG, PHOS,  in the last 168 hours Liver Function Tests: No results found for this basename: AST, ALT, ALKPHOS, BILITOT, PROT, ALBUMIN,  in the last 168 hours No results found for this basename:  LIPASE, AMYLASE,  in the last 168 hours No results found for this basename: AMMONIA,  in the last 168 hours CBC: No results found for this basename: WBC, NEUTROABS, HGB, HCT, MCV, PLT,  in the last 168 hours Cardiac Enzymes: No results found for this basename: CKTOTAL, CKMB, CKMBINDEX, TROPONINI,  in the last 168 hours BNP: BNP (last 3 results) No results found for this basename: PROBNP,  in the last 8760 hours CBG: No results found for this basename: GLUCAP,  in the last 168 hours     Signed:  Emilina Smarr  [facility] 08/22/2012, 9:26 PM

## 2012-10-30 DIAGNOSIS — R109 Unspecified abdominal pain: Secondary | ICD-10-CM | POA: Diagnosis not present

## 2012-10-30 DIAGNOSIS — F209 Schizophrenia, unspecified: Secondary | ICD-10-CM | POA: Diagnosis not present

## 2012-10-30 DIAGNOSIS — Z79899 Other long term (current) drug therapy: Secondary | ICD-10-CM | POA: Diagnosis not present

## 2012-10-30 DIAGNOSIS — F319 Bipolar disorder, unspecified: Secondary | ICD-10-CM | POA: Diagnosis not present

## 2012-10-30 DIAGNOSIS — N183 Chronic kidney disease, stage 3 unspecified: Secondary | ICD-10-CM | POA: Diagnosis not present

## 2012-10-30 DIAGNOSIS — I10 Essential (primary) hypertension: Secondary | ICD-10-CM | POA: Diagnosis not present

## 2012-10-30 DIAGNOSIS — E785 Hyperlipidemia, unspecified: Secondary | ICD-10-CM | POA: Diagnosis not present

## 2012-10-30 DIAGNOSIS — F17200 Nicotine dependence, unspecified, uncomplicated: Secondary | ICD-10-CM | POA: Diagnosis not present

## 2012-10-30 DIAGNOSIS — I509 Heart failure, unspecified: Secondary | ICD-10-CM | POA: Diagnosis not present

## 2012-10-30 DIAGNOSIS — E538 Deficiency of other specified B group vitamins: Secondary | ICD-10-CM | POA: Diagnosis not present

## 2012-11-10 ENCOUNTER — Inpatient Hospital Stay (HOSPITAL_COMMUNITY)
Admission: EM | Admit: 2012-11-10 | Discharge: 2012-11-15 | DRG: 918 | Disposition: A | Discharge status: Home / Self Care | Payer: Medicare Other | Attending: Internal Medicine | Admitting: Internal Medicine

## 2012-11-10 ENCOUNTER — Inpatient Hospital Stay (HOSPITAL_COMMUNITY): Payer: Medicare Other

## 2012-11-10 ENCOUNTER — Encounter (HOSPITAL_COMMUNITY): Payer: Self-pay | Admitting: *Deleted

## 2012-11-10 DIAGNOSIS — R51 Headache: Secondary | ICD-10-CM | POA: Diagnosis present

## 2012-11-10 DIAGNOSIS — K259 Gastric ulcer, unspecified as acute or chronic, without hemorrhage or perforation: Secondary | ICD-10-CM | POA: Diagnosis present

## 2012-11-10 DIAGNOSIS — K269 Duodenal ulcer, unspecified as acute or chronic, without hemorrhage or perforation: Secondary | ICD-10-CM | POA: Diagnosis not present

## 2012-11-10 DIAGNOSIS — R1032 Left lower quadrant pain: Secondary | ICD-10-CM | POA: Diagnosis not present

## 2012-11-10 DIAGNOSIS — M129 Arthropathy, unspecified: Secondary | ICD-10-CM | POA: Diagnosis present

## 2012-11-10 DIAGNOSIS — E1129 Type 2 diabetes mellitus with other diabetic kidney complication: Secondary | ICD-10-CM | POA: Diagnosis not present

## 2012-11-10 DIAGNOSIS — R000 Tachycardia, unspecified: Secondary | ICD-10-CM

## 2012-11-10 DIAGNOSIS — Z96659 Presence of unspecified artificial knee joint: Secondary | ICD-10-CM | POA: Diagnosis not present

## 2012-11-10 DIAGNOSIS — K5900 Constipation, unspecified: Secondary | ICD-10-CM | POA: Diagnosis not present

## 2012-11-10 DIAGNOSIS — K819 Cholecystitis, unspecified: Secondary | ICD-10-CM | POA: Diagnosis not present

## 2012-11-10 DIAGNOSIS — A419 Sepsis, unspecified organism: Secondary | ICD-10-CM | POA: Diagnosis not present

## 2012-11-10 DIAGNOSIS — D631 Anemia in chronic kidney disease: Secondary | ICD-10-CM | POA: Diagnosis present

## 2012-11-10 DIAGNOSIS — R1013 Epigastric pain: Secondary | ICD-10-CM | POA: Diagnosis present

## 2012-11-10 DIAGNOSIS — I10 Essential (primary) hypertension: Secondary | ICD-10-CM | POA: Diagnosis not present

## 2012-11-10 DIAGNOSIS — T39094A Poisoning by salicylates, undetermined, initial encounter: Principal | ICD-10-CM | POA: Diagnosis present

## 2012-11-10 DIAGNOSIS — R1031 Right lower quadrant pain: Secondary | ICD-10-CM | POA: Diagnosis not present

## 2012-11-10 DIAGNOSIS — K2960 Other gastritis without bleeding: Secondary | ICD-10-CM | POA: Diagnosis not present

## 2012-11-10 DIAGNOSIS — R4182 Altered mental status, unspecified: Secondary | ICD-10-CM | POA: Diagnosis present

## 2012-11-10 DIAGNOSIS — T50901S Poisoning by unspecified drugs, medicaments and biological substances, accidental (unintentional), sequela: Secondary | ICD-10-CM | POA: Diagnosis not present

## 2012-11-10 DIAGNOSIS — Z79899 Other long term (current) drug therapy: Secondary | ICD-10-CM | POA: Diagnosis not present

## 2012-11-10 DIAGNOSIS — F17200 Nicotine dependence, unspecified, uncomplicated: Secondary | ICD-10-CM | POA: Diagnosis present

## 2012-11-10 DIAGNOSIS — N183 Chronic kidney disease, stage 3 unspecified: Secondary | ICD-10-CM | POA: Diagnosis not present

## 2012-11-10 DIAGNOSIS — R6521 Severe sepsis with septic shock: Secondary | ICD-10-CM | POA: Diagnosis not present

## 2012-11-10 DIAGNOSIS — E860 Dehydration: Secondary | ICD-10-CM

## 2012-11-10 DIAGNOSIS — E119 Type 2 diabetes mellitus without complications: Secondary | ICD-10-CM | POA: Diagnosis present

## 2012-11-10 DIAGNOSIS — R29810 Facial weakness: Secondary | ICD-10-CM | POA: Diagnosis present

## 2012-11-10 DIAGNOSIS — K8010 Calculus of gallbladder with chronic cholecystitis without obstruction: Secondary | ICD-10-CM | POA: Diagnosis present

## 2012-11-10 DIAGNOSIS — N179 Acute kidney failure, unspecified: Secondary | ICD-10-CM

## 2012-11-10 DIAGNOSIS — E872 Acidosis, unspecified: Secondary | ICD-10-CM | POA: Diagnosis present

## 2012-11-10 DIAGNOSIS — Z791 Long term (current) use of non-steroidal anti-inflammatories (NSAID): Secondary | ICD-10-CM | POA: Diagnosis not present

## 2012-11-10 DIAGNOSIS — I129 Hypertensive chronic kidney disease with stage 1 through stage 4 chronic kidney disease, or unspecified chronic kidney disease: Secondary | ICD-10-CM | POA: Diagnosis present

## 2012-11-10 DIAGNOSIS — R404 Transient alteration of awareness: Secondary | ICD-10-CM | POA: Diagnosis not present

## 2012-11-10 DIAGNOSIS — D508 Other iron deficiency anemias: Secondary | ICD-10-CM | POA: Diagnosis not present

## 2012-11-10 DIAGNOSIS — R109 Unspecified abdominal pain: Secondary | ICD-10-CM | POA: Diagnosis not present

## 2012-11-10 DIAGNOSIS — R0902 Hypoxemia: Secondary | ICD-10-CM | POA: Diagnosis present

## 2012-11-10 DIAGNOSIS — N289 Disorder of kidney and ureter, unspecified: Secondary | ICD-10-CM | POA: Diagnosis not present

## 2012-11-10 DIAGNOSIS — F319 Bipolar disorder, unspecified: Secondary | ICD-10-CM | POA: Diagnosis not present

## 2012-11-10 DIAGNOSIS — N184 Chronic kidney disease, stage 4 (severe): Secondary | ICD-10-CM | POA: Diagnosis present

## 2012-11-10 DIAGNOSIS — N281 Cyst of kidney, acquired: Secondary | ICD-10-CM | POA: Diagnosis not present

## 2012-11-10 DIAGNOSIS — K8020 Calculus of gallbladder without cholecystitis without obstruction: Secondary | ICD-10-CM | POA: Diagnosis not present

## 2012-11-10 DIAGNOSIS — N390 Urinary tract infection, site not specified: Secondary | ICD-10-CM | POA: Diagnosis not present

## 2012-11-10 DIAGNOSIS — T6591XS Toxic effect of unspecified substance, accidental (unintentional), sequela: Secondary | ICD-10-CM | POA: Diagnosis not present

## 2012-11-10 DIAGNOSIS — N039 Chronic nephritic syndrome with unspecified morphologic changes: Secondary | ICD-10-CM | POA: Diagnosis present

## 2012-11-10 HISTORY — DX: Nicotine dependence, unspecified, uncomplicated: F17.200

## 2012-11-10 LAB — BLOOD GAS, ARTERIAL
Acid-base deficit: 7.6 mmol/L — ABNORMAL HIGH (ref 0.0–2.0)
Bicarbonate: 17.6 mEq/L — ABNORMAL LOW (ref 20.0–24.0)
Drawn by: 257701
FIO2: 0.21 %
O2 Saturation: 94 %
Patient temperature: 98.6
TCO2: 16.7 mmol/L (ref 0–100)
pCO2 arterial: 36.8 mmHg (ref 35.0–45.0)
pH, Arterial: 7.302 — ABNORMAL LOW (ref 7.350–7.450)
pO2, Arterial: 72.6 mmHg — ABNORMAL LOW (ref 80.0–100.0)

## 2012-11-10 LAB — URINALYSIS, ROUTINE W REFLEX MICROSCOPIC
Bilirubin Urine: NEGATIVE
Glucose, UA: NEGATIVE mg/dL
Hgb urine dipstick: NEGATIVE
Ketones, ur: NEGATIVE mg/dL
Nitrite: NEGATIVE
Protein, ur: 30 mg/dL — AB
Specific Gravity, Urine: 1.012 (ref 1.005–1.030)
Urobilinogen, UA: 0.2 mg/dL (ref 0.0–1.0)
pH: 5.5 (ref 5.0–8.0)

## 2012-11-10 LAB — COMPREHENSIVE METABOLIC PANEL
ALT: 13 U/L (ref 0–35)
AST: 28 U/L (ref 0–37)
Albumin: 3.3 g/dL — ABNORMAL LOW (ref 3.5–5.2)
Alkaline Phosphatase: 52 U/L (ref 39–117)
BUN: 57 mg/dL — ABNORMAL HIGH (ref 6–23)
CO2: 15 mEq/L — ABNORMAL LOW (ref 19–32)
Calcium: 9.6 mg/dL (ref 8.4–10.5)
Chloride: 105 mEq/L (ref 96–112)
Creatinine, Ser: 3.21 mg/dL — ABNORMAL HIGH (ref 0.50–1.10)
GFR calc Af Amer: 18 mL/min — ABNORMAL LOW (ref >90)
GFR calc non Af Amer: 15 mL/min — ABNORMAL LOW (ref >90)
Glucose, Bld: 81 mg/dL (ref 70–99)
Potassium: 4.3 mEq/L (ref 3.5–5.1)
Sodium: 136 mEq/L (ref 135–145)
Total Bilirubin: 0.1 mg/dL — ABNORMAL LOW (ref 0.3–1.2)
Total Protein: 7.4 g/dL (ref 6.0–8.3)

## 2012-11-10 LAB — RAPID URINE DRUG SCREEN, HOSP PERFORMED
Amphetamines: NOT DETECTED
Barbiturates: NOT DETECTED
Benzodiazepines: NOT DETECTED
Cocaine: NOT DETECTED
Opiates: NOT DETECTED
Tetrahydrocannabinol: NOT DETECTED

## 2012-11-10 LAB — URINE MICROSCOPIC-ADD ON

## 2012-11-10 LAB — CBC WITH DIFFERENTIAL/PLATELET
Basophils Absolute: 0 10*3/uL (ref 0.0–0.1)
Basophils Relative: 0 % (ref 0–1)
Eosinophils Absolute: 0.1 10*3/uL (ref 0.0–0.7)
Eosinophils Relative: 1 % (ref 0–5)
HCT: 32.6 % — ABNORMAL LOW (ref 36.0–46.0)
Hemoglobin: 10.6 g/dL — ABNORMAL LOW (ref 12.0–15.0)
Lymphocytes Relative: 21 % (ref 12–46)
Lymphs Abs: 1.6 10*3/uL (ref 0.7–4.0)
MCH: 31.1 pg (ref 26.0–34.0)
MCHC: 32.5 g/dL (ref 30.0–36.0)
MCV: 95.6 fL (ref 78.0–100.0)
Monocytes Absolute: 0.8 10*3/uL (ref 0.1–1.0)
Monocytes Relative: 10 % (ref 3–12)
Neutro Abs: 5.1 10*3/uL (ref 1.7–7.7)
Neutrophils Relative %: 67 % (ref 43–77)
Platelets: 211 10*3/uL (ref 150–400)
RBC: 3.41 MIL/uL — ABNORMAL LOW (ref 3.87–5.11)
RDW: 17.3 % — ABNORMAL HIGH (ref 11.5–15.5)
WBC: 7.6 10*3/uL (ref 4.0–10.5)

## 2012-11-10 LAB — SALICYLATE LEVEL
Salicylate Lvl: 23.8 mg/dL — ABNORMAL HIGH (ref 2.8–20.0)
Salicylate Lvl: 18.1 mg/dL (ref 2.8–20.0)

## 2012-11-10 LAB — MAGNESIUM: Magnesium: 2.8 mg/dL — ABNORMAL HIGH (ref 1.5–2.5)

## 2012-11-10 LAB — BASIC METABOLIC PANEL
BUN: 54 mg/dL — ABNORMAL HIGH (ref 6–23)
CO2: 19 mEq/L (ref 19–32)
Calcium: 10.1 mg/dL (ref 8.4–10.5)
Chloride: 106 mEq/L (ref 96–112)
Creatinine, Ser: 3.22 mg/dL — ABNORMAL HIGH (ref 0.50–1.10)
GFR calc Af Amer: 18 mL/min — ABNORMAL LOW (ref >90)
GFR calc non Af Amer: 15 mL/min — ABNORMAL LOW (ref >90)
Glucose, Bld: 79 mg/dL (ref 70–99)
Potassium: 3.6 mEq/L (ref 3.5–5.1)
Sodium: 137 mEq/L (ref 135–145)

## 2012-11-10 LAB — ETHANOL: Alcohol, Ethyl (B): <11 mg/dL (ref 0–11)

## 2012-11-10 LAB — CG4 I-STAT (LACTIC ACID): Lactic Acid, Venous: <0.3 mmol/L — ABNORMAL LOW (ref 0.5–2.2)

## 2012-11-10 LAB — PROTIME-INR
INR: 1.07 (ref 0.00–1.49)
Prothrombin Time: 13.7 seconds (ref 11.6–15.2)

## 2012-11-10 LAB — ACETAMINOPHEN LEVEL: Acetaminophen (Tylenol), Serum: <15 ug/mL (ref 10–30)

## 2012-11-10 LAB — MRSA PCR SCREENING: MRSA by PCR: NEGATIVE

## 2012-11-10 MED ORDER — ZOLPIDEM TARTRATE 10 MG PO TABS
10.0000 mg | ORAL_TABLET | Freq: Every evening | ORAL | Status: DC | PRN
  Administered 2012-11-11 – 2012-11-12 (×2): 10 mg via ORAL
  Filled 2012-11-10 (×2): qty 1

## 2012-11-10 MED ORDER — FENTANYL CITRATE 0.05 MG/ML IJ SOLN
50.0000 ug | Freq: Once | INTRAMUSCULAR | Status: DC
  Filled 2012-11-10: qty 2

## 2012-11-10 MED ORDER — SIMVASTATIN 20 MG PO TABS
20.0000 mg | ORAL_TABLET | Freq: Every evening | ORAL | Status: DC
  Administered 2012-11-10 – 2012-11-14 (×5): 20 mg via ORAL
  Filled 2012-11-10 (×6): qty 1

## 2012-11-10 MED ORDER — ONDANSETRON HCL 4 MG PO TABS: 4.0000 mg | ORAL_TABLET | Freq: Four times a day (QID) | ORAL | Status: DC | PRN

## 2012-11-10 MED ORDER — DEXTROSE-NACL 5-0.9 % IV SOLN
INTRAVENOUS | Status: DC
  Administered 2012-11-10 – 2012-11-11 (×2): 100 mL/h via INTRAVENOUS

## 2012-11-10 MED ORDER — ACETAMINOPHEN 650 MG RE SUPP: 650.0000 mg | Freq: Four times a day (QID) | RECTAL | Status: DC | PRN

## 2012-11-10 MED ORDER — DEXTROSE 5 % IV SOLN
INTRAVENOUS | Status: DC
  Administered 2012-11-10: 14:00:00 via INTRAVENOUS

## 2012-11-10 MED ORDER — METOPROLOL SUCCINATE ER 100 MG PO TB24
100.0000 mg | ORAL_TABLET | Freq: Every day | ORAL | Status: DC
  Administered 2012-11-10 – 2012-11-14 (×5): 100 mg via ORAL
  Filled 2012-11-10 (×6): qty 1

## 2012-11-10 MED ORDER — SODIUM BICARBONATE 8.4 % IV SOLN
50.0000 meq | Freq: Once | INTRAVENOUS | Status: AC
  Administered 2012-11-10: 50 meq via INTRAVENOUS
  Filled 2012-11-10: qty 50

## 2012-11-10 MED ORDER — SODIUM BICARBONATE 8.4 % IV SOLN
INTRAVENOUS | Status: DC
  Filled 2012-11-10 (×2): qty 850

## 2012-11-10 MED ORDER — HYDRALAZINE HCL 25 MG PO TABS
25.0000 mg | ORAL_TABLET | Freq: Four times a day (QID) | ORAL | Status: DC | PRN
  Administered 2012-11-10 – 2012-11-12 (×3): 25 mg via ORAL
  Filled 2012-11-10 (×3): qty 1

## 2012-11-10 MED ORDER — ONDANSETRON HCL 4 MG/2ML IJ SOLN: 4.0000 mg | Freq: Four times a day (QID) | INTRAMUSCULAR | Status: DC | PRN

## 2012-11-10 MED ORDER — DIVALPROEX SODIUM 500 MG PO DR TAB
1000.0000 mg | DELAYED_RELEASE_TABLET | Freq: Every day | ORAL | Status: DC
  Administered 2012-11-10 – 2012-11-14 (×5): 1000 mg via ORAL
  Filled 2012-11-10 (×6): qty 2

## 2012-11-10 MED ORDER — ZIPRASIDONE HCL 60 MG PO CAPS
120.0000 mg | ORAL_CAPSULE | Freq: Every day | ORAL | Status: DC
  Administered 2012-11-10 – 2012-11-14 (×5): 120 mg via ORAL
  Filled 2012-11-10 (×6): qty 2

## 2012-11-10 MED ORDER — HEPARIN SODIUM (PORCINE) 5000 UNIT/ML IJ SOLN
5000.0000 [IU] | Freq: Three times a day (TID) | INTRAMUSCULAR | Status: DC
  Administered 2012-11-10 – 2012-11-15 (×15): 5000 [IU] via SUBCUTANEOUS
  Filled 2012-11-10 (×17): qty 1

## 2012-11-10 MED ORDER — MIRTAZAPINE 15 MG PO TABS
15.0000 mg | ORAL_TABLET | Freq: Every day | ORAL | Status: DC
  Administered 2012-11-11 – 2012-11-14 (×4): 15 mg via ORAL
  Filled 2012-11-10 (×6): qty 1

## 2012-11-10 MED ORDER — PANTOPRAZOLE SODIUM 40 MG PO TBEC
40.0000 mg | DELAYED_RELEASE_TABLET | Freq: Every day | ORAL | Status: DC
  Administered 2012-11-10 – 2012-11-15 (×6): 40 mg via ORAL
  Filled 2012-11-10 (×7): qty 1

## 2012-11-10 MED ORDER — SODIUM CHLORIDE 0.9 % IV BOLUS (SEPSIS)
2000.0000 mL | Freq: Once | INTRAVENOUS | Status: AC
  Administered 2012-11-10: 2000 mL via INTRAVENOUS

## 2012-11-10 MED ORDER — SODIUM BICARBONATE 8.4 % IV SOLN: INTRAVENOUS | Status: DC

## 2012-11-10 MED ORDER — ACETAMINOPHEN 325 MG PO TABS
650.0000 mg | ORAL_TABLET | Freq: Four times a day (QID) | ORAL | Status: DC | PRN
  Administered 2012-11-10 – 2012-11-15 (×5): 650 mg via ORAL
  Filled 2012-11-10 (×5): qty 2

## 2012-11-10 MED ORDER — DEXTROSE 5 % IV SOLN: INTRAVENOUS | Status: DC

## 2012-11-10 MED ORDER — SODIUM CHLORIDE 0.9 % IJ SOLN
3.0000 mL | Freq: Two times a day (BID) | INTRAMUSCULAR | Status: DC
  Administered 2012-11-11 – 2012-11-13 (×5): 3 mL via INTRAVENOUS

## 2012-11-10 NOTE — ED Notes (Signed)
Pt's daughter took all of her belongings home. Pt changed into paper scrubs, has been wanded by security

## 2012-11-10 NOTE — Progress Notes (Signed)
Telephone order for safety sitter received from Dr. Gonzella Lex.  Patient denies suicide ideation.  Patient stated "I want to be able to take care of my grandson". Patient alert and oriented but behavior is intermittently verbally aggressive.  Right side facial droop observed.  Notified Dr. Gonzella Lex and CT of the head ordered.  Left grip is greater than right.  Questions answered appropriately.  Pupils are 3mm, round and brisk.  Patient complaining of mild headache.   Tylenol administered (see MAR).

## 2012-11-10 NOTE — ED Notes (Signed)
Pt given water 

## 2012-11-10 NOTE — ED Notes (Signed)
Transfer to psych ed cancelled per dr bednar

## 2012-11-10 NOTE — ED Provider Notes (Signed)
History    CSN: [id number] Arrival date & time 11/10/12  0931  First MD Initiated Contact with Patient 11/10/12 [phone]     Chief Complaint  Patient presents with  . Altered Mental Status   (Consider location/radiation/quality/duration/timing/severity/associated sxs/prior Treatment) HPI This 52 year old female is brought to the emergency room by her daughter the patient states she has no idea why she is here she denies any threats or herself or others denies suicidal or homicidal ideation denies hallucinations denies any headache neck pain back pain chest pain shortness breath abdominal pain weakness numbness or other concerns and states she takes her medicines as directed but apparently there is a concern from the family the patient does not take medicines as directed, the patient is not interested in detox . She states she does not have a problem abusing medicines or other drugs. Past Medical History  Diagnosis Date  . Mental disorder   . Depression   . Hypertension   . Diabetes mellitus without complication   . Overdose    History reviewed. No pertinent past surgical history. No family history on file. History  Substance Use Topics  . Smoking status: Current Every Day Smoker -- 0.50 packs/day  . Smokeless tobacco: Never Used  . Alcohol Use: Yes   OB History   Grav Para Term Preterm Abortions TAB SAB Ect Mult Living                 Review of Systems 10 Systems reviewed and are negative for acute change except as noted in the HPI. Allergies  Codeine sulfate  Home Medications   No current outpatient prescriptions on file. BP 163/107  Pulse 86  Temp(Src) 98.1 F (36.7 C) (Oral)  Resp 11  SpO2 98% Physical Exam  Nursing note and vitals reviewed. Constitutional: She is oriented to person, place, and time.  Awake, alert, nontoxic appearance with baseline speech for patient.  HENT:  Head: Atraumatic.  Mouth/Throat: No oropharyngeal exudate.  Eyes: EOM are normal.  Pupils are equal, round, and reactive to light. Right eye exhibits no discharge. Left eye exhibits no discharge.  Neck: Neck supple.  Cardiovascular: Normal rate and regular rhythm.   No murmur heard. Pulmonary/Chest: Effort normal and breath sounds normal. No stridor. No respiratory distress. She has no wheezes. She has no rales. She exhibits no tenderness.  Abdominal: Soft. Bowel sounds are normal. She exhibits no mass. There is no tenderness. There is no rebound.  Musculoskeletal: She exhibits no tenderness.  Baseline ROM, moves extremities with no obvious new focal weakness.  Lymphadenopathy:    She has no cervical adenopathy.  Neurological: She is alert and oriented to person, place, and time.  Awake, alert, cooperative and aware of situation; motor strength bilaterally; sensation normal to light touch bilaterally; peripheral visual fields full to confrontation; no facial asymmetry; tongue midline; major cranial nerves appear intact; no pronator drift, normal finger to nose bilaterally  Skin: No rash noted.  Psychiatric: She has a normal mood and affect.    ED Course  Procedures (including critical care time)  Pt admits taking frequent BC powder chronically without OD.  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 Critical care time  was exclusive of separately billable procedures and treating other patients. Critical care was necessary to treat or prevent imminent or life-threatening deterioration. Critical care was time spent personally by me on the following activities: development of treatment plan with patient and/or surrogate as well as nursing,  discussions with consultants, evaluation of patient's response to treatment, examination of patient, obtaining history from patient or surrogate, ordering and performing treatments and interventions, ordering and review of laboratory studies, ordering and review of radiographic studies, pulse oximetry and re-evaluation of patient's condition. Labs Reviewed  CBC WITH DIFFERENTIAL - Abnormal; Notable for the following:    RBC 3.41 (*)    Hemoglobin 10.6 (*)    HCT 32.6 (*)    RDW 17.3 (*)    All other components within normal limits  COMPREHENSIVE METABOLIC PANEL - Abnormal; Notable for the following:    CO2 15 (*)    BUN 57 (*)    Creatinine, Ser 3.21 (*)    Albumin 3.3 (*)    Total Bilirubin 0.1 (*)    GFR calc non Af Amer 15 (*)    GFR calc Af Amer 18 (*)    All other components within normal limits  URINALYSIS, ROUTINE W REFLEX MICROSCOPIC - Abnormal; Notable for the following:    Protein, ur 30 (*)    Leukocytes, UA SMALL (*)    All other components within normal limits  SALICYLATE LEVEL - Abnormal; Notable for the following:    Salicylate Lvl 23.8 (*)    All other components within normal limits  URINE MICROSCOPIC-ADD ON - Abnormal; Notable for the following:    Squamous Epithelial / LPF FEW (*)    All other components within normal limits  BLOOD GAS, ARTERIAL - Abnormal; Notable for the following:    pH, Arterial 7.302 (*)    pO2, Arterial 72.6 (*)    Bicarbonate 17.6 (*)    Acid-base deficit 7.6 (*)    All other components within normal limits  MAGNESIUM - Abnormal; Notable for the following:    Magnesium 2.8 (*)    All other components within normal limits  BASIC METABOLIC PANEL - Abnormal; Notable for the following:    BUN 54 (*)    Creatinine, Ser 3.22 (*)    GFR calc non Af Amer 15 (*)    GFR calc Af Amer 18 (*)    All other components within normal limits  CG4 I-STAT (LACTIC ACID) - Abnormal; Notable for the following:    Lactic Acid, Venous <0.30 (*)    All  other components within normal limits  MRSA PCR SCREENING  ACETAMINOPHEN LEVEL  ETHANOL  URINE RAPID DRUG SCREEN (HOSP PERFORMED)  PROTIME-INR  SALICYLATE LEVEL  COMPREHENSIVE METABOLIC PANEL  CBC   Ct Head Wo Contrast  11/10/2012   *RADIOLOGY REPORT*  Clinical Data: Confusion.  Altered mental status.  History of acute renal failure, drug overdose, and bipolar disorder.  CT HEAD WITHOUT CONTRAST  Technique:  Contiguous axial images were obtained from the base of the skull through the vertex without contrast.  Comparison: 07/29/2012  Findings: The ventricles and sulci are symmetrical without significant effacement, displacement, or dilatation. No mass effect or midline shift. No abnormal extra-axial fluid collections. The grey-white matter junction is distinct. Basal cisterns are not effaced. No acute intracranial hemorrhage. No depressed skull fractures.  Visualized paranasal sinuses demonstrate retention cyst in the left maxillary antrum.  Mastoid air cells are not opacified. No significant change since previous study.  IMPRESSION: No acute  intracranial abnormalities.   Original Report Authenticated By: Burman Nieves, M.D.   1. Poisoning, salicylate, initial encounter   2. Renal insufficiency   3. Metabolic acidosis   4. ARF (acute renal failure)   5. Bipolar disorder     MDM  The patient appears reasonably stabilized for admission considering the current resources, flow, and capabilities available in the ED at this time, and I doubt any other [facility] requiring further screening and/or treatment in the ED prior to admission.  Hurman Horn, MD 11/10/12 [phone]

## 2012-11-10 NOTE — ED Notes (Signed)
Sitter at bedside.

## 2012-11-10 NOTE — ED Notes (Signed)
Poison control called for lab results - requests to recheck ABG. Will notify MD

## 2012-11-10 NOTE — ED Notes (Signed)
Per pt's daughter, pt cannot have Haldol or Risperdal - unsure of pt's reactions

## 2012-11-10 NOTE — [facility] Assessment (Signed)
[facility] Assessment Progress Note    Dr. Fonnie Jarvis reports pt will be admitted medically and to not see pt as previously planned

## 2012-11-10 NOTE — ED Notes (Signed)
Pt's daughter leaving ED - [name]: [phone]

## 2012-11-10 NOTE — ED Notes (Addendum)
Per pt's daughter, reports pt has been acting abnormal for the past 3 days. Believes pt has been taking more of her medication than prescribed. States pt has been smoking at least 3 packs of cigarettes a day. Reports pt was watching her 53 year old son, and had one of her prescriptions out, and 53 year old got into medication and had to be taken to the hospital.  Daughter reports pt has substance abuse hx, will say she is in pain to get narcotics

## 2012-11-10 NOTE — ED Notes (Signed)
Pt closing her eyes while writer is asking her triage  questions, keeps asking to use the phone, wants to "check on her grandson" Pt does not know why her daughter brought her into ED

## 2012-11-10 NOTE — H&P (Signed)
[facility] History and Physical  Jennifer Lawson ZOX:[id number] DOB: [birth year] DOA: 11/10/2012  Referring physician: Dr Fonnie Jarvis PCP: No primary provider on file.   Chief Complaint:  Altered mental status  History obtained  From ED . Daughters were unreachable over the phone   HPI:  52 year old female with history of depression, diabetes mellitus, bipolar disorder who was admitted several times for drug overdose, stage IV CKD was brought to the ED by her daughter for ? Confusion. The patient did not have an idea why she was in the hospital. She denied any hallucinations, suicidal or homicidal ideations. Patient denies any headache, blurred vision, chest pain, palpitations, shortness of breath, nausea, vomiting, dizziness, ringing in the ears. She denied any abdominal pain, bowel or urinary symptoms. As per the ED family had concerns that she was not taking her medications as directed.  I tried to call her daughters on the phone numbers available in the system but was unable to the stem and left a voice message.  Patient on questioning does seem to have good orientation but seemed  frustrated as the wood he told her why she was in the hospital and what the plan was. In the ED her vitals were stable. Blood work showed chronic anemia and some worsening of her creatinine from her baseline. Chemistry done which showed mild gap acidosis . An ABG was done which showed mild hypoxia.  Salicylate level was elevated to 23.  Poison control was consulted and was recommended to monitor her for a new change in her neurological symptoms and renal function. Patient given D5 and ordered for an amp of bicarbonate. repeat BMET showed closed AG.  since patient had normal mentation, no further workup was done and [facility] called for admission.  Review of Systems:  Constitutional: Denies fever, chills, diaphoresis, appetite change and fatigue.  HEENT: Denies photophobia, eye pain, redness, hearing  loss, ear pain, congestion, sore throat, rhinorrhea, sneezing, mouth sores, trouble swallowing, neck pain, neck stiffness and tinnitus.   Respiratory: Denies SOB, DOE, cough, chest tightness,  and wheezing.   Cardiovascular: Denies chest pain, palpitations and leg swelling.  Gastrointestinal: Denies nausea, vomiting, abdominal pain, diarrhea, constipation, blood in stool and abdominal distention.  Genitourinary: Denies dysuria, urgency, frequency, hematuria, flank pain and difficulty urinating.  Musculoskeletal: Denies myalgias, back pain, joint swelling, arthralgias and gait problem.  Skin: Denies pallor, rash and wound.  Neurological: Denies dizziness, seizures, syncope, weakness, light-headedness, numbness and headaches.  Psychiatric/Behavioral: Denies suicidal ideation, mood changes, confusion, nervousness, sleep disturbance and agitation   Past Medical History  Diagnosis Date  . Mental disorder   . Depression   . Hypertension   . Diabetes mellitus without complication   . Overdose    History reviewed. No pertinent past surgical history. Social History:  reports that she has been smoking.  She has never used smokeless tobacco. She reports that  drinks alcohol. She reports that she uses illicit drugs.  Allergies  Allergen Reactions  . Codeine Sulfate Anaphylaxis    Daughter called about having this allergy     No family history on file.  Prior to Admission medications   Medication Sig Start Date End Date Taking? Authorizing Provider  divalproex (DEPAKOTE) 500 MG DR tablet Take 1,000 mg by mouth at bedtime.    Yes Historical Provider, MD  furosemide (LASIX) 40 MG tablet Take 40 mg by mouth daily.   Yes Historical Provider, MD  ibuprofen (ADVIL,MOTRIN) 200 MG tablet Take 200 mg by mouth every 6 (  six) hours as needed for pain.   Yes Historical Provider, MD  metoprolol succinate (TOPROL-XL) 100 MG 24 hr tablet Take 100 mg by mouth daily. Take with or immediately following a meal.    Yes Historical Provider, MD  mirtazapine (REMERON) 15 MG tablet Take 15 mg by mouth at bedtime. At 9pm   Yes Historical Provider, MD  omeprazole (PRILOSEC) 20 MG capsule Take 20 mg by mouth daily.   Yes Historical Provider, MD  simvastatin (ZOCOR) 20 MG tablet Take 20 mg by mouth every evening.   Yes Historical Provider, MD  ziprasidone (GEODON) 60 MG capsule Take 120 mg by mouth at bedtime.   Yes Historical Provider, MD  zolpidem (AMBIEN) 10 MG tablet Take 10 mg by mouth at bedtime as needed for sleep.   Yes Historical Provider, MD    Physical Exam:  Filed Vitals:   11/10/12 0941 11/10/12 1215  BP: 150/93 143/91  Pulse: 106 96  Temp: 98.8 F (37.1 C)   TempSrc: Oral   Resp: 18 22  SpO2: 96% 96%    Constitutional: Vital signs reviewed.  Patient is  an elderly female lying in bed in no acute distress HEENT: no pallor, moist mucosa, no LAD. Neck: Supple, Trachea midline normal ROM, No JVD, mass, thyromegaly, or carotid bruit present.  Cardiovascular: RRR, S1 normal, S2 normal, no MRG, pulses symmetric and intact bilaterally Pulmonary/Chest: CTAB, no wheezes, rales, or rhonchi Abdominal: Soft. Non-tender, non-distended, bowel sounds are normal, no masses, organomegaly, or guarding present.  GU: no CVA tenderness Musculoskeletal: No joint deformities, erythema, or stiffness, ROM full and no nontender Ext: no edema and no cyanosis, pulses palpable bilaterally  Hematology: no cervical, inginal, or axillary adenopathy.  Neurological: A&O x3, Strenght is normal and symmetric bilaterally, cranial nerve II-XII are grossly intact, no focal motor deficit, sensory intact to light touch bilaterally. cerebellar function seems intact although little slow on coordination. Normal gait.  Skin: Warm, dry and intact. No rash, cyanosis, or clubbing.  Psychiatric: flat affect. speech and behavior is normal. Judgment and thought content seems normal. Cognition and memory are normal.   Labs on Admission:   Basic Metabolic Panel:  Recent Labs Lab 11/10/12 1030 11/10/12 1630  NA 136 137  K 4.3 3.6  CL 105 106  CO2 15* 19  GLUCOSE 81 79  BUN 57* 54*  CREATININE 3.21* 3.22*  CALCIUM 9.6 10.1  MG  --  2.8*   Liver Function Tests:  Recent Labs Lab 11/10/12 1030  AST 28  ALT 13  ALKPHOS 52  BILITOT 0.1*  PROT 7.4  ALBUMIN 3.3*   No results found for this basename: LIPASE, AMYLASE,  in the last 168 hours No results found for this basename: AMMONIA,  in the last 168 hours CBC:  Recent Labs Lab 11/10/12 1030  WBC 7.6  NEUTROABS 5.1  HGB 10.6*  HCT 32.6*  MCV 95.6  PLT 211   Cardiac Enzymes: No results found for this basename: CKTOTAL, CKMB, CKMBINDEX, TROPONINI,  in the last 168 hours BNP: No components found with this basename: POCBNP,  CBG: No results found for this basename: GLUCAP,  in the last 168 hours  Radiological Exams on Admission: No results found.  EKG:NSR , TWI in lateral leads, normal QTC  Assessment/Plan   Principal Problem:   Overdose of salicylate As per the ED physician patient has been taking her medication randomly and not as directed. Her salicylate level 23 . No neurological abnormality on exam however the nurse  did mention to me noticing occasional right facial droop  Admit to ICU neurocheck q 4 hrs Will obtain stat head CT Repeat salicylate level is normal Needs psych consult in am ( called) to assess capacity. Patient denies suicidal ideation.  Sitter for safety. Monitor for complications of  salicylate overdose including pulmonary edema, cardiac toxicity, renal toxicity. She does have some TWI on EKG and needs repeat EKG in am. Lytes stable. Her renal function has worsened from baseline but doesn't warrant acute intervention  given D5 and ordered for 1 amp fof  bicarb in ED. Will place her on D5 NS   acute on  CKD (chronic kidney disease) Baseline creatinine around 2.8.  3.2 this admission.contineue  IV fluids. UA  unremarkable Recheck renal fn in am. If worsened will need renal consult will     HTN (hypertension) elevated BP  continue toprol  hydralazine prn   DVT prophylaxis  Diet: low sodium  Code Status:  FULL CODE Family Communication: none at bedside Disposition Plan: pending  Eddie North [facility] Pager [phone]  If 7PM-7AM, please contact night-coverage [email or web address] Password [email or web address] 11/10/2012, 5:10 PM   Total time spent: 70 minutes

## 2012-11-11 DIAGNOSIS — R404 Transient alteration of awareness: Secondary | ICD-10-CM

## 2012-11-11 LAB — COMPREHENSIVE METABOLIC PANEL
ALT: 10 U/L (ref 0–35)
AST: 16 U/L (ref 0–37)
Albumin: 2.7 g/dL — ABNORMAL LOW (ref 3.5–5.2)
Alkaline Phosphatase: 44 U/L (ref 39–117)
BUN: 46 mg/dL — ABNORMAL HIGH (ref 6–23)
CO2: 18 mEq/L — ABNORMAL LOW (ref 19–32)
Calcium: 9.6 mg/dL (ref 8.4–10.5)
Chloride: 108 mEq/L (ref 96–112)
Creatinine, Ser: 2.93 mg/dL — ABNORMAL HIGH (ref 0.50–1.10)
GFR calc Af Amer: 20 mL/min — ABNORMAL LOW (ref >90)
GFR calc non Af Amer: 17 mL/min — ABNORMAL LOW (ref >90)
Glucose, Bld: 117 mg/dL — ABNORMAL HIGH (ref 70–99)
Potassium: 3.8 mEq/L (ref 3.5–5.1)
Sodium: 136 mEq/L (ref 135–145)
Total Bilirubin: 0.1 mg/dL — ABNORMAL LOW (ref 0.3–1.2)
Total Protein: 6.2 g/dL (ref 6.0–8.3)

## 2012-11-11 LAB — CBC
HCT: 29.4 % — ABNORMAL LOW (ref 36.0–46.0)
Hemoglobin: 9.4 g/dL — ABNORMAL LOW (ref 12.0–15.0)
MCH: 30.5 pg (ref 26.0–34.0)
MCHC: 32 g/dL (ref 30.0–36.0)
MCV: 95.5 fL (ref 78.0–100.0)
Platelets: 194 10*3/uL (ref 150–400)
RBC: 3.08 MIL/uL — ABNORMAL LOW (ref 3.87–5.11)
RDW: 17.5 % — ABNORMAL HIGH (ref 11.5–15.5)
WBC: 5.5 10*3/uL (ref 4.0–10.5)

## 2012-11-11 LAB — BASIC METABOLIC PANEL
BUN: 45 mg/dL — ABNORMAL HIGH (ref 6–23)
CO2: 17 mEq/L — ABNORMAL LOW (ref 19–32)
Calcium: 9.8 mg/dL (ref 8.4–10.5)
Chloride: 110 mEq/L (ref 96–112)
Creatinine, Ser: 2.86 mg/dL — ABNORMAL HIGH (ref 0.50–1.10)
GFR calc Af Amer: 21 mL/min — ABNORMAL LOW (ref >90)
GFR calc non Af Amer: 18 mL/min — ABNORMAL LOW (ref >90)
Glucose, Bld: 99 mg/dL (ref 70–99)
Potassium: 3.9 mEq/L (ref 3.5–5.1)
Sodium: 138 mEq/L (ref 135–145)

## 2012-11-11 LAB — IRON AND TIBC
Iron: 90 ug/dL (ref 42–135)
Saturation Ratios: 33 % (ref 20–55)
TIBC: 276 ug/dL (ref 250–470)
UIBC: 186 ug/dL (ref 125–400)

## 2012-11-11 LAB — SALICYLATE LEVEL: Salicylate Lvl: 6.7 mg/dL (ref 2.8–20.0)

## 2012-11-11 LAB — TSH: TSH: 0.491 u[IU]/mL (ref 0.350–4.500)

## 2012-11-11 LAB — VITAMIN B12: Vitamin B-12: 909 pg/mL (ref 211–911)

## 2012-11-11 MED ORDER — NICOTINE 21 MG/24HR TD PT24
21.0000 mg | MEDICATED_PATCH | Freq: Every day | TRANSDERMAL | Status: DC
  Administered 2012-11-11 – 2012-11-15 (×5): 21 mg via TRANSDERMAL
  Filled 2012-11-11 (×5): qty 1

## 2012-11-11 MED ORDER — SODIUM BICARBONATE 8.4 % IV SOLN
INTRAVENOUS | Status: DC
  Administered 2012-11-11 (×2): via INTRAVENOUS
  Filled 2012-11-11 (×5): qty 1000

## 2012-11-11 NOTE — Consult Note (Signed)
Reason for Consult: capacity? Referring Physician: unknown  Jennifer Lawson is an 52 y.o. female.  HPI:    52 year old female with history of depression, diabetes mellitus, bipolar disorder seen today. Very confused. Taking her meds. No sure why she is here now, has no idea about her medical issues now.   Per history she was admitted  for drug overdose, stage IV CKD was brought to the ED by her daughter for confusion? The patient did not have an idea why she was in the hospital. She denied any hallucinations, suicidal or homicidal ideations.    In the ED her vitals were stable. Blood work showed chronic anemia and some worsening of her creatinine from her baseline. Chemistry done which showed mild gap acidosis . An ABG was done which showed mild hypoxia.  Salicylate level was elevated to 23.  Poison control was consulted and was recommended to monitor her for a new change in her neurological symptoms and renal function.    Past Medical History  Diagnosis Date  . Mental disorder   . Depression   . Hypertension   . Diabetes mellitus without complication   . Overdose     History reviewed. No pertinent past surgical history.  No family history on file.  Social History:  reports that she has been smoking.  She has never used smokeless tobacco. She reports that  drinks alcohol. She reports that she uses illicit drugs.  Allergies:  Allergies  Allergen Reactions  . Codeine Sulfate Anaphylaxis    Daughter called about having this allergy     Medications: I have reviewed the patient's current medications.  Results for orders placed during the hospital encounter of 11/10/12 (from the past 48 hour(s))  CBC WITH DIFFERENTIAL     Status: Abnormal   Collection Time    11/10/12 10:30 AM      Result Value Range   WBC 7.6  4.0 - 10.5 K/uL   RBC 3.41 (*) 3.87 - 5.11 MIL/uL   Hemoglobin 10.6 (*) 12.0 - 15.0 g/dL   HCT 16.1 (*) 09.6 - 04.5 %   MCV 95.6  78.0 - 100.0 fL   MCH 31.1  26.0  - 34.0 pg   MCHC 32.5  30.0 - 36.0 g/dL   RDW 40.9 (*) 81.1 - 91.4 %   Platelets 211  150 - 400 K/uL   Neutrophils Relative % 67  43 - 77 %   Neutro Abs 5.1  1.7 - 7.7 K/uL   Lymphocytes Relative 21  12 - 46 %   Lymphs Abs 1.6  0.7 - 4.0 K/uL   Monocytes Relative 10  3 - 12 %   Monocytes Absolute 0.8  0.1 - 1.0 K/uL   Eosinophils Relative 1  0 - 5 %   Eosinophils Absolute 0.1  0.0 - 0.7 K/uL   Basophils Relative 0  0 - 1 %   Basophils Absolute 0.0  0.0 - 0.1 K/uL  COMPREHENSIVE METABOLIC PANEL     Status: Abnormal   Collection Time    11/10/12 10:30 AM      Result Value Range   Sodium 136  135 - 145 mEq/L   Potassium 4.3  3.5 - 5.1 mEq/L   Chloride 105  96 - 112 mEq/L   CO2 15 (*) 19 - 32 mEq/L   Glucose, Bld 81  70 - 99 mg/dL   BUN 57 (*) 6 - 23 mg/dL   Creatinine, Ser 7.82 (*) 0.50 - 1.10 mg/dL  Calcium 9.6  8.4 - 10.5 mg/dL   Total Protein 7.4  6.0 - 8.3 g/dL   Albumin 3.3 (*) 3.5 - 5.2 g/dL   AST 28  0 - 37 U/L   ALT 13  0 - 35 U/L   Alkaline Phosphatase 52  39 - 117 U/L   Total Bilirubin 0.1 (*) 0.3 - 1.2 mg/dL   GFR calc non Af Amer 15 (*) >90 mL/min   GFR calc Af Amer 18 (*) >90 mL/min   Comment:            The eGFR has been calculated     using the CKD EPI equation.     This calculation has not been     validated in all clinical     situations.     eGFR's persistently     <90 mL/min signify     possible Chronic Kidney Disease.  ACETAMINOPHEN LEVEL     Status: None   Collection Time    11/10/12 10:30 AM      Result Value Range   Acetaminophen (Tylenol), Serum <15.0  10 - 30 ug/mL   Comment:            THERAPEUTIC CONCENTRATIONS VARY     SIGNIFICANTLY. A RANGE OF 10-30     ug/mL MAY BE AN EFFECTIVE     CONCENTRATION FOR MANY PATIENTS.     HOWEVER, SOME ARE BEST TREATED     AT CONCENTRATIONS OUTSIDE THIS     RANGE.     ACETAMINOPHEN CONCENTRATIONS     >150 ug/mL AT 4 HOURS AFTER     INGESTION AND >50 ug/mL AT 12     HOURS AFTER INGESTION ARE     OFTEN  ASSOCIATED WITH TOXIC     REACTIONS.  SALICYLATE LEVEL     Status: Abnormal   Collection Time    11/10/12 10:30 AM      Result Value Range   Salicylate Lvl 23.8 (*) 2.8 - 20.0 mg/dL  ETHANOL     Status: None   Collection Time    11/10/12 10:30 AM      Result Value Range   Alcohol, Ethyl (B) <11  0 - 11 mg/dL   Comment:            LOWEST DETECTABLE LIMIT FOR     SERUM ALCOHOL IS 11 mg/dL     FOR MEDICAL PURPOSES ONLY  URINALYSIS, ROUTINE W REFLEX MICROSCOPIC     Status: Abnormal   Collection Time    11/10/12 11:17 AM      Result Value Range   Color, Urine YELLOW  YELLOW   APPearance CLEAR  CLEAR   Specific Gravity, Urine 1.012  1.005 - 1.030   pH 5.5  5.0 - 8.0   Glucose, UA NEGATIVE  NEGATIVE mg/dL   Hgb urine dipstick NEGATIVE  NEGATIVE   Bilirubin Urine NEGATIVE  NEGATIVE   Ketones, ur NEGATIVE  NEGATIVE mg/dL   Protein, ur 30 (*) NEGATIVE mg/dL   Urobilinogen, UA 0.2  0.0 - 1.0 mg/dL   Nitrite NEGATIVE  NEGATIVE   Leukocytes, UA SMALL (*) NEGATIVE  URINE RAPID DRUG SCREEN (HOSP PERFORMED)     Status: None   Collection Time    11/10/12 11:17 AM      Result Value Range   Opiates NONE DETECTED  NONE DETECTED   Cocaine NONE DETECTED  NONE DETECTED   Benzodiazepines NONE DETECTED  NONE DETECTED   Amphetamines NONE DETECTED  NONE DETECTED   Tetrahydrocannabinol NONE DETECTED  NONE DETECTED   Barbiturates NONE DETECTED  NONE DETECTED   Comment:            DRUG SCREEN FOR MEDICAL PURPOSES     ONLY.  IF CONFIRMATION IS NEEDED     FOR ANY PURPOSE, NOTIFY LAB     WITHIN 5 DAYS.                LOWEST DETECTABLE LIMITS     FOR URINE DRUG SCREEN     Drug Class       Cutoff (ng/mL)     Amphetamine      1000     Barbiturate      200     Benzodiazepine   200     Tricyclics       300     Opiates          300     Cocaine          300     THC              50  URINE MICROSCOPIC-ADD ON     Status: Abnormal   Collection Time    11/10/12 11:17 AM      Result Value Range    Squamous Epithelial / LPF FEW (*) RARE   WBC, UA 0-2  <3 WBC/hpf   RBC / HPF 0-2  <3 RBC/hpf  BLOOD GAS, ARTERIAL     Status: Abnormal   Collection Time    11/10/12  3:00 PM      Result Value Range   FIO2 0.21     pH, Arterial 7.302 (*) 7.350 - 7.450   pCO2 arterial 36.8  35.0 - 45.0 mmHg   pO2, Arterial 72.6 (*) 80.0 - 100.0 mmHg   Bicarbonate 17.6 (*) 20.0 - 24.0 mEq/L   TCO2 16.7  0 - 100 mmol/L   Acid-base deficit 7.6 (*) 0.0 - 2.0 mmol/L   O2 Saturation 94.0     Patient temperature 98.6     Collection site RIGHT RADIAL     Drawn by [id number]     Sample type ARTERIAL DRAW     Allens test (pass/fail) PASS  PASS  PROTIME-INR     Status: None   Collection Time    11/10/12  4:30 PM      Result Value Range   Prothrombin Time 13.7  11.6 - 15.2 seconds   INR 1.07  0.00 - 1.49  MAGNESIUM     Status: Abnormal   Collection Time    11/10/12  4:30 PM      Result Value Range   Magnesium 2.8 (*) 1.5 - 2.5 mg/dL  SALICYLATE LEVEL     Status: None   Collection Time    11/10/12  4:30 PM      Result Value Range   Salicylate Lvl 18.1  2.8 - 20.0 mg/dL  BASIC METABOLIC PANEL     Status: Abnormal   Collection Time    11/10/12  4:30 PM      Result Value Range   Sodium 137  135 - 145 mEq/L   Potassium 3.6  3.5 - 5.1 mEq/L   Chloride 106  96 - 112 mEq/L   CO2 19  19 - 32 mEq/L   Glucose, Bld 79  70 - 99 mg/dL   BUN 54 (*) 6 - 23 mg/dL   Creatinine, Ser 0.45 (*) 0.50 - 1.10 mg/dL  Calcium 10.1  8.4 - 10.5 mg/dL   GFR calc non Af Amer 15 (*) >90 mL/min   GFR calc Af Amer 18 (*) >90 mL/min   Comment:            The eGFR has been calculated     using the CKD EPI equation.     This calculation has not been     validated in all clinical     situations.     eGFR's persistently     <90 mL/min signify     possible Chronic Kidney Disease.  CG4 I-STAT (LACTIC ACID)     Status: Abnormal   Collection Time    11/10/12  4:41 PM      Result Value Range   Lactic Acid, Venous <0.30 (*) 0.5 -  2.2 mmol/L  MRSA PCR SCREENING     Status: None   Collection Time    11/10/12  5:57 PM      Result Value Range   MRSA by PCR NEGATIVE  NEGATIVE   Comment:            The GeneXpert MRSA Assay (FDA     approved for NASAL specimens     only), is one component of a     comprehensive MRSA colonization     surveillance program. It is not     intended to diagnose MRSA     infection nor to guide or     monitor treatment for     MRSA infections.  COMPREHENSIVE METABOLIC PANEL     Status: Abnormal   Collection Time    11/11/12  4:00 AM      Result Value Range   Sodium 136  135 - 145 mEq/L   Potassium 3.8  3.5 - 5.1 mEq/L   Chloride 108  96 - 112 mEq/L   CO2 18 (*) 19 - 32 mEq/L   Glucose, Bld 117 (*) 70 - 99 mg/dL   BUN 46 (*) 6 - 23 mg/dL   Creatinine, Ser 1.61 (*) 0.50 - 1.10 mg/dL   Calcium 9.6  8.4 - 09.6 mg/dL   Total Protein 6.2  6.0 - 8.3 g/dL   Albumin 2.7 (*) 3.5 - 5.2 g/dL   AST 16  0 - 37 U/L   ALT 10  0 - 35 U/L   Alkaline Phosphatase 44  39 - 117 U/L   Total Bilirubin 0.1 (*) 0.3 - 1.2 mg/dL   Comment: REPEATED TO VERIFY   GFR calc non Af Amer 17 (*) >90 mL/min   GFR calc Af Amer 20 (*) >90 mL/min   Comment:            The eGFR has been calculated     using the CKD EPI equation.     This calculation has not been     validated in all clinical     situations.     eGFR's persistently     <90 mL/min signify     possible Chronic Kidney Disease.  CBC     Status: Abnormal   Collection Time    11/11/12  4:00 AM      Result Value Range   WBC 5.5  4.0 - 10.5 K/uL   RBC 3.08 (*) 3.87 - 5.11 MIL/uL   Hemoglobin 9.4 (*) 12.0 - 15.0 g/dL   HCT 04.5 (*) 40.9 - 81.1 %   MCV 95.5  78.0 - 100.0 fL   MCH 30.5  26.0 - 34.0 pg  MCHC 32.0  30.0 - 36.0 g/dL   RDW 16.1 (*) 09.6 - 04.5 %   Platelets 194  150 - 400 K/uL  SALICYLATE LEVEL     Status: None   Collection Time    11/11/12 10:45 AM      Result Value Range   Salicylate Lvl 6.7  2.8 - 20.0 mg/dL  BASIC METABOLIC  PANEL     Status: Abnormal   Collection Time    11/11/12 10:45 AM      Result Value Range   Sodium 138  135 - 145 mEq/L   Potassium 3.9  3.5 - 5.1 mEq/L   Chloride 110  96 - 112 mEq/L   CO2 17 (*) 19 - 32 mEq/L   Glucose, Bld 99  70 - 99 mg/dL   BUN 45 (*) 6 - 23 mg/dL   Creatinine, Ser 4.09 (*) 0.50 - 1.10 mg/dL   Calcium 9.8  8.4 - 81.1 mg/dL   GFR calc non Af Amer 18 (*) >90 mL/min   GFR calc Af Amer 21 (*) >90 mL/min   Comment:            The eGFR has been calculated     using the CKD EPI equation.     This calculation has not been     validated in all clinical     situations.     eGFR's persistently     <90 mL/min signify     possible Chronic Kidney Disease.  IRON AND TIBC     Status: None   Collection Time    11/11/12  1:00 PM      Result Value Range   Iron 90  42 - 135 ug/dL   TIBC 914  782 - 956 ug/dL   Saturation Ratios 33  20 - 55 %   UIBC 186  125 - 400 ug/dL  VITAMIN O13     Status: None   Collection Time    11/11/12  1:00 PM      Result Value Range   Vitamin B-12 909  211 - 911 pg/mL  TSH     Status: None   Collection Time    11/11/12  1:00 PM      Result Value Range   TSH 0.491  0.350 - 4.500 uIU/mL    Ct Head Wo Contrast  11/10/2012   *RADIOLOGY REPORT*  Clinical Data: Confusion.  Altered mental status.  History of acute renal failure, drug overdose, and bipolar disorder.  CT HEAD WITHOUT CONTRAST  Technique:  Contiguous axial images were obtained from the base of the skull through the vertex without contrast.  Comparison: 07/29/2012  Findings: The ventricles and sulci are symmetrical without significant effacement, displacement, or dilatation. No mass effect or midline shift. No abnormal extra-axial fluid collections. The grey-white matter junction is distinct. Basal cisterns are not effaced. No acute intracranial hemorrhage. No depressed skull fractures.  Visualized paranasal sinuses demonstrate retention cyst in the left maxillary antrum.  Mastoid air  cells are not opacified. No significant change since previous study.  IMPRESSION: No acute intracranial abnormalities.   Original Report Authenticated By: Burman Nieves, M.D.    Review of Systems  Neurological: Positive for tingling.   Blood pressure 168/116, pulse 78, temperature 98.6 F (37 C), temperature source Oral, resp. rate 16, SpO2 99.00%. Physical Exam  Mental Status Examination/Evaluation:   Appearance: on bed confused   Eye Contact::  poor   Speech: limited   Volume: low  Mood: no answer   Affect: ristricted   Thought Process: disorganized   Orientation: 2/4 only person and place   Thought Content: denies AVH   Suicidal Thoughts: No   Homicidal Thoughts: no   Memory: Recent; Poor   Judgement: Impaired   Insight: Lacking   Psychomotor Activity: slow   Concentration: poor   Recall: poor   Akathisia: No   Assessment:   AXIS I: Delirium  NOS, hx of bipolar d/o AXIS II: Deferred   AXIS III: see emdical hx ?     AXIS IV: unknown   AXIS V: 15   ?   Treatment Plan/Recommendations:   - Likley delirium at this time. Pt is not refusing any treatment per her nurse today.   - will continue to follow and evaluate her. Will ask Psy SW to find her baseline from family   - continue current meds and avoid antichlinergics   - will follow as needed    Wonda Cerise 11/11/2012, 9:46 PM

## 2012-11-11 NOTE — Progress Notes (Addendum)
[facility] PROGRESS NOTE  Jennifer Lawson UJW:[id number] DOB: 07-14-1960 DOA: 11/10/2012 PCP: No primary provider on file. Brief narrative: 52 year old female with history of depression, diabetes mellitus, bipolar disorder who was admitted several times in the past  for drug overdose, stage IV CKD was brought to the ED by her daughter for  Increased confusion.  As per daughter, whom i spoke to over the phone today, Patient taking a lot of BC powders and smoking more almost  3-4 PPD. Patient behaving differently , confused , sluggish, and tremulous for past 2 days. Patient also c/o abdominal pain for over a month and taking BC powders ( 10-12 a day for abdominal pain and headache) . Daughter not sure if she is taking her medicine appropriately. Patient noted to have elevated salicylate level and mild anion gap metabolic acidosis. No acute neurological deficit noted. She was admitted to ICU for closer monitoring.    Assessment/Plan: Overdose of salicylate , likely chronic   Her salicylate level was 23 on admission ( normal <20). No neurological abnormality on exam however the nurse did mention to me noticing occasional right facial droop . CT head negative. Mentation is clear. Admitted to ICU . utox negative. neurocheck q 4 hrs  -Repeat salicylate level is normal . -psych consult called for her bipolar disorder and assess capacity. to assess capacity. Patient denies suicidal ideation. Sitter for safety.  -Monitor for complications of salicylate overdose so far . She  had some TWI on EKG on admission. repeat EKG this am normal . Lytes stable. Her renal function has worsened from baseline but doesn't warrant acute intervention  given D5 and  1 amp fof bicarb in ED.   acute on CKD (chronic kidney disease)  Baseline creatinine around 2.8.  3.2 ON admission. Improved to baseline this am Monitor daily  Metabolic acidosis Bicarb of 17 today. Will switch fluids to D5 with 1 amp bicarb Repeat  salicylate level and BMET in am  Anemia  hb around 9-10 recently. Was normal 8 months back. Check iron panel, B12 and stool for occult blood. Given excessive use of BC powder and c/o stomach pain erosive gastritis is a possibility  Tobacco abuse  as per daughter ,  she was placed on chantix as outpatient but had agitation and behavioral disturbances. Will order nicotine patch. Counseled on cessation   HTN (hypertension)  continue toprol  hydralazine prn   Bipolar disorder  resume home meds   DVT prophylaxis   Diet: low sodium  Code Status: FULL CODE  Family Communication: none at bedside . Discussed with Daughter Aris Lot over the phone [phone]) Disposition Plan: pending       Consultants:  psych  Procedures:  none  Antibiotics: None  HPI/Subjective: Patient alert and oriented   Objective: Filed Vitals:   11/11/12 0600 11/11/12 0755 11/11/12 0800 11/11/12 1000  BP: 92/51  134/88 126/75  Pulse: 74  74 75  Temp:  98.2 F (36.8 C) 98.2 F (36.8 C)   TempSrc:   Oral   Resp: 15  13 16   SpO2: 94%  95% 95%    Intake/Output Summary (Last 24 hours) at 11/11/12 1126 Last data filed at 11/11/12 1000  Gross per 24 hour  Intake   1843 ml  Output   3050 ml  Net  -1207 ml   There were no vitals filed for this visit.  Exam:   General:  Middle aged female in NAD  HEENT: no pallor, moist oral mucosa  Cardiovascular: NS1&S2,  no murmurs  Respiratory: clear b/l, no added sounds  Abdomen: soft, NT, ND, BS+  Musculoskeletal: Warm, no edema  CNS: AAOX3, non focal   Data Reviewed: Basic Metabolic Panel:  Recent Labs Lab 11/10/12 1030 11/10/12 1630 11/11/12 0400 11/11/12 1045  NA 136 137 136 138  K 4.3 3.6 3.8 3.9  CL 105 106 108 110  CO2 15* 19 18* 17*  GLUCOSE 81 79 117* 99  BUN 57* 54* 46* 45*  CREATININE 3.21* 3.22* 2.93* 2.86*  CALCIUM 9.6 10.1 9.6 9.8  MG  --  2.8*  --   --    Liver Function Tests:  Recent Labs Lab 11/10/12 1030  11/11/12 0400  AST 28 16  ALT 13 10  ALKPHOS 52 44  BILITOT 0.1* 0.1*  PROT 7.4 6.2  ALBUMIN 3.3* 2.7*   No results found for this basename: LIPASE, AMYLASE,  in the last 168 hours No results found for this basename: AMMONIA,  in the last 168 hours CBC:  Recent Labs Lab 11/10/12 1030 11/11/12 0400  WBC 7.6 5.5  NEUTROABS 5.1  --   HGB 10.6* 9.4*  HCT 32.6* 29.4*  MCV 95.6 95.5  PLT 211 194   Cardiac Enzymes: No results found for this basename: CKTOTAL, CKMB, CKMBINDEX, TROPONINI,  in the last 168 hours BNP (last 3 results) No results found for this basename: PROBNP,  in the last 8760 hours CBG: No results found for this basename: GLUCAP,  in the last 168 hours  Recent Results (from the past 240 hour(s))  MRSA PCR SCREENING     Status: None   Collection Time    11/10/12  5:57 PM      Result Value Range Status   MRSA by PCR NEGATIVE  NEGATIVE Final   Comment:            The GeneXpert MRSA Assay (FDA     approved for NASAL specimens     only), is one component of a     comprehensive MRSA colonization     surveillance program. It is not     intended to diagnose MRSA     infection nor to guide or     monitor treatment for     MRSA infections.     Studies: Ct Head Wo Contrast  11/10/2012   *RADIOLOGY REPORT*  Clinical Data: Confusion.  Altered mental status.  History of acute renal failure, drug overdose, and bipolar disorder.  CT HEAD WITHOUT CONTRAST  Technique:  Contiguous axial images were obtained from the base of the skull through the vertex without contrast.  Comparison: 07/29/2012  Findings: The ventricles and sulci are symmetrical without significant effacement, displacement, or dilatation. No mass effect or midline shift. No abnormal extra-axial fluid collections. The grey-white matter junction is distinct. Basal cisterns are not effaced. No acute intracranial hemorrhage. No depressed skull fractures.  Visualized paranasal sinuses demonstrate retention cyst in  the left maxillary antrum.  Mastoid air cells are not opacified. No significant change since previous study.  IMPRESSION: No acute intracranial abnormalities.   Original Report Authenticated By: Burman Nieves, M.D.    Scheduled Meds: . divalproex  1,000 mg Oral QHS  . fentaNYL  50 mcg Intravenous Once  . heparin  5,000 Units Subcutaneous Q8H  . metoprolol succinate  100 mg Oral Q supper  . mirtazapine  15 mg Oral QHS  . pantoprazole  40 mg Oral Daily  . simvastatin  20 mg Oral QPM  . sodium chloride  3 mL Intravenous Q12H  . ziprasidone  120 mg Oral QHS   Continuous Infusions: . dextrose 5 % and 0.9% NaCl 100 mL/hr (11/11/12 0533)      Time spent: 35 minutes    Diyari Cherne  [facility] Pager [phone] If 7PM-7AM, please contact night-coverage at [email or web address], password [facility] 11/11/2012, 11:26 AM  LOS: 1 day

## 2012-11-12 ENCOUNTER — Encounter (HOSPITAL_COMMUNITY): Payer: Self-pay

## 2012-11-12 DIAGNOSIS — I10 Essential (primary) hypertension: Secondary | ICD-10-CM

## 2012-11-12 DIAGNOSIS — N183 Chronic kidney disease, stage 3 unspecified: Secondary | ICD-10-CM

## 2012-11-12 LAB — SALICYLATE LEVEL: Salicylate Lvl: <2 mg/dL — ABNORMAL LOW (ref 2.8–20.0)

## 2012-11-12 LAB — CBC
HCT: 32 % — ABNORMAL LOW (ref 36.0–46.0)
Hemoglobin: 10.1 g/dL — ABNORMAL LOW (ref 12.0–15.0)
MCH: 30.4 pg (ref 26.0–34.0)
MCHC: 31.6 g/dL (ref 30.0–36.0)
MCV: 96.4 fL (ref 78.0–100.0)
Platelets: 190 10*3/uL (ref 150–400)
RBC: 3.32 MIL/uL — ABNORMAL LOW (ref 3.87–5.11)
RDW: 17.2 % — ABNORMAL HIGH (ref 11.5–15.5)
WBC: 5.7 10*3/uL (ref 4.0–10.5)

## 2012-11-12 LAB — BASIC METABOLIC PANEL
BUN: 35 mg/dL — ABNORMAL HIGH (ref 6–23)
CO2: 22 mEq/L (ref 19–32)
Calcium: 9.7 mg/dL (ref 8.4–10.5)
Chloride: 108 mEq/L (ref 96–112)
Creatinine, Ser: 2.87 mg/dL — ABNORMAL HIGH (ref 0.50–1.10)
GFR calc Af Amer: 20 mL/min — ABNORMAL LOW (ref >90)
GFR calc non Af Amer: 18 mL/min — ABNORMAL LOW (ref >90)
Glucose, Bld: 133 mg/dL — ABNORMAL HIGH (ref 70–99)
Potassium: 3.7 mEq/L (ref 3.5–5.1)
Sodium: 137 mEq/L (ref 135–145)

## 2012-11-12 MED ORDER — HYDRALAZINE HCL 50 MG PO TABS
50.0000 mg | ORAL_TABLET | Freq: Four times a day (QID) | ORAL | Status: DC
  Administered 2012-11-12 – 2012-11-15 (×11): 50 mg via ORAL
  Filled 2012-11-12 (×14): qty 1

## 2012-11-12 MED ORDER — HYDRALAZINE HCL 20 MG/ML IJ SOLN
20.0000 mg | Freq: Once | INTRAMUSCULAR | Status: AC
  Administered 2012-11-12: 20 mg via INTRAVENOUS
  Filled 2012-11-12: qty 1

## 2012-11-12 MED ORDER — HYDRALAZINE HCL 50 MG PO TABS
50.0000 mg | ORAL_TABLET | Freq: Three times a day (TID) | ORAL | Status: DC
  Administered 2012-11-12 (×2): 50 mg via ORAL
  Filled 2012-11-12 (×5): qty 1

## 2012-11-12 NOTE — Progress Notes (Signed)
[facility] PROGRESS NOTE  Jennifer Lawson ION:[id number] DOB: 19-Jan-1961 DOA: 11/10/2012 PCP: Jackie Plum, MD  Brief narrative:  52 year old female with history of depression, diabetes mellitus, bipolar disorder who was admitted several times in the past for drug overdose, stage IV CKD was brought to the ED by her daughter for Increased confusion.  As per daughter, whom i spoke to over the phone today, Patient taking a lot of BC powders and smoking more almost 3-4 PPD. Patient behaving differently , confused , sluggish, and tremulous for past 2 days. Patient also c/o abdominal pain for over a month and taking BC powders ( 10-12 a day for abdominal pain and headache) . Daughter not sure if she is taking her medicine appropriately. Patient noted to have elevated salicylate level and mild anion gap metabolic acidosis. No acute neurological deficit noted. She was admitted to ICU for closer monitoring.   Assessment/Plan:  Overdose of salicylate , likely chronic  Her salicylate level was 23 on admission ( normal <20). No neurological abnormality on exam however the nurse did mention to me noticing occasional right facial droop . CT head negative. Mentation is clear.  Admitted to ICU . utox negative.  neurocheck q 4 hrs  -Repeat salicylate level is normal .  -psych consult called for her bipolar disorder and assess capacity. to assess capacity. Patient denies suicidal ideation. Sitter for safety discontinued -no complications of salicylate overdose so far . She had some TWI on EKG on admission. repeat EKG this am normal . Lytes stable. Her renal function has worsened from baseline but doesn't warrant acute intervention  given D5 and 2 amp fof bicarb so far.  MMSE done by psych SW which was normal. Further psych recommendations pending.  acute on CKD (chronic kidney disease)  Baseline creatinine around 2.8.  3.2 on admission. Improved to baseline  Monitor daily   Metabolic acidosis   Bicarb level wnl Repeat salicylate level normal  Anemia  hb around 9-10 recently. Was normal 8 months back. Possibly ACD. iron panel, B12 normal .stool for occult blood pending. Given excessive use of BC powder and c/o stomach pain erosive gastritis is a possibility .  abdominal pain  reports vague abdominal discomfort Will obtain US abdomen  Tobacco abuse  as per daughter , she was placed on chantix as outpatient but had agitation and behavioral disturbances. ordered nicotine patch. Counseled on cessation   HTN (hypertension)  uncontrolled  continue toprol  Will place on  hydralazine 50 mg q 6h  Bipolar disorder  resume home meds  Psych following  DVT prophylaxis   Diet: low sodium   Code Status: FULL CODE  Family Communication: none at bedside . Discussed with Daughter Aris Lot over the phone [phone]) on 7/6  Disposition Plan: pending    Consultants:  psych Procedures:  none Antibiotics:  None   HPI/Subjective: Patient's BP has been quite high this morning.   Objective: Filed Vitals:   11/12/12 1221 11/12/12 1300 11/12/12 1400 11/12/12 1435  BP: 190/118 146/89 130/108 130/108  Pulse:  97 90   Temp:      TempSrc:      Resp:  20 28   Weight:      SpO2:  100% 100%     Intake/Output Summary (Last 24 hours) at 11/12/12 1556 Last data filed at 11/12/12 1500  Gross per 24 hour  Intake   2653 ml  Output   3500 ml  Net   -847 ml   Filed Weights   11/12/12  0600  Weight: 96.7 kg (213 lb 3 oz)    Exam:  General: Middle aged female in NAD  HEENT: no pallor, moist oral mucosa  Cardiovascular: NS1&S2, no murmurs  Respiratory: clear b/l, no added sounds  Abdomen: soft, NT, ND, BS+  Musculoskeletal: Warm, no edema  CNS: AAOX3, non focal    Data Reviewed: Basic Metabolic Panel:  Recent Labs Lab 11/10/12 1030 11/10/12 1630 11/11/12 0400 11/11/12 1045 11/12/12 0335  NA 136 137 136 138 137  K 4.3 3.6 3.8 3.9 3.7  CL 105 106 108 110 108  CO2  15* 19 18* 17* 22  GLUCOSE 81 79 117* 99 133*  BUN 57* 54* 46* 45* 35*  CREATININE 3.21* 3.22* 2.93* 2.86* 2.87*  CALCIUM 9.6 10.1 9.6 9.8 9.7  MG  --  2.8*  --   --   --    Liver Function Tests:  Recent Labs Lab 11/10/12 1030 11/11/12 0400  AST 28 16  ALT 13 10  ALKPHOS 52 44  BILITOT 0.1* 0.1*  PROT 7.4 6.2  ALBUMIN 3.3* 2.7*   No results found for this basename: LIPASE, AMYLASE,  in the last 168 hours No results found for this basename: AMMONIA,  in the last 168 hours CBC:  Recent Labs Lab 11/10/12 1030 11/11/12 0400 11/12/12 0335  WBC 7.6 5.5 5.7  NEUTROABS 5.1  --   --   HGB 10.6* 9.4* 10.1*  HCT 32.6* 29.4* 32.0*  MCV 95.6 95.5 96.4  PLT 211 194 190   Cardiac Enzymes: No results found for this basename: CKTOTAL, CKMB, CKMBINDEX, TROPONINI,  in the last 168 hours BNP (last 3 results) No results found for this basename: PROBNP,  in the last 8760 hours CBG: No results found for this basename: GLUCAP,  in the last 168 hours  Recent Results (from the past 240 hour(s))  MRSA PCR SCREENING     Status: None   Collection Time    11/10/12  5:57 PM      Result Value Range Status   MRSA by PCR NEGATIVE  NEGATIVE Final   Comment:            The GeneXpert MRSA Assay (FDA     approved for NASAL specimens     only), is one component of a     comprehensive MRSA colonization     surveillance program. It is not     intended to diagnose MRSA     infection nor to guide or     monitor treatment for     MRSA infections.     Studies: Ct Head Wo Contrast  11/10/2012   *RADIOLOGY REPORT*  Clinical Data: Confusion.  Altered mental status.  History of acute renal failure, drug overdose, and bipolar disorder.  CT HEAD WITHOUT CONTRAST  Technique:  Contiguous axial images were obtained from the base of the skull through the vertex without contrast.  Comparison: 07/29/2012  Findings: The ventricles and sulci are symmetrical without significant effacement, displacement, or  dilatation. No mass effect or midline shift. No abnormal extra-axial fluid collections. The grey-white matter junction is distinct. Basal cisterns are not effaced. No acute intracranial hemorrhage. No depressed skull fractures.  Visualized paranasal sinuses demonstrate retention cyst in the left maxillary antrum.  Mastoid air cells are not opacified. No significant change since previous study.  IMPRESSION: No acute intracranial abnormalities.   Original Report Authenticated By: Burman Nieves, M.D.    Scheduled Meds: . divalproex  1,000 mg Oral QHS  .  fentaNYL  50 mcg Intravenous Once  . heparin  5,000 Units Subcutaneous Q8H  . hydrALAZINE  50 mg Oral Q8H  . metoprolol succinate  100 mg Oral Q supper  . mirtazapine  15 mg Oral QHS  . nicotine  21 mg Transdermal Daily  . pantoprazole  40 mg Oral Daily  . simvastatin  20 mg Oral QPM  . sodium chloride  3 mL Intravenous Q12H  . ziprasidone  120 mg Oral QHS   Continuous Infusions:     Time spent: 25 minutes    Latesa Fratto  [facility] Pager [phone]. If 7PM-7AM, please contact night-coverage at [email or web address], password [facility] 11/12/2012, 3:56 PM  LOS: 2 days
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
## 2012-11-12 NOTE — Progress Notes (Signed)
Patient not interested in initiating MYChart at this time
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
## 2012-11-13 ENCOUNTER — Inpatient Hospital Stay (HOSPITAL_COMMUNITY): Payer: Medicare Other

## 2012-11-13 ENCOUNTER — Encounter (HOSPITAL_COMMUNITY): Payer: Self-pay | Admitting: General Surgery

## 2012-11-13 DIAGNOSIS — F319 Bipolar disorder, unspecified: Secondary | ICD-10-CM

## 2012-11-13 DIAGNOSIS — K8020 Calculus of gallbladder without cholecystitis without obstruction: Secondary | ICD-10-CM

## 2012-11-13 DIAGNOSIS — T6591XS Toxic effect of unspecified substance, accidental (unintentional), sequela: Secondary | ICD-10-CM

## 2012-11-13 DIAGNOSIS — T50901S Poisoning by unspecified drugs, medicaments and biological substances, accidental (unintentional), sequela: Secondary | ICD-10-CM

## 2012-11-13 DIAGNOSIS — R109 Unspecified abdominal pain: Secondary | ICD-10-CM

## 2012-11-13 DIAGNOSIS — F17200 Nicotine dependence, unspecified, uncomplicated: Secondary | ICD-10-CM

## 2012-11-13 HISTORY — DX: Nicotine dependence, unspecified, uncomplicated: F17.200

## 2012-11-13 LAB — HEMOGLOBIN A1C
Hgb A1c MFr Bld: 4.8 % (ref <5.7)
Mean Plasma Glucose: 91 mg/dL (ref <117)

## 2012-11-13 LAB — BASIC METABOLIC PANEL
BUN: 30 mg/dL — ABNORMAL HIGH (ref 6–23)
CO2: 22 mEq/L (ref 19–32)
Calcium: 10.4 mg/dL (ref 8.4–10.5)
Chloride: 113 mEq/L — ABNORMAL HIGH (ref 96–112)
Creatinine, Ser: 3.12 mg/dL — ABNORMAL HIGH (ref 0.50–1.10)
GFR calc Af Amer: 18 mL/min — ABNORMAL LOW (ref >90)
GFR calc non Af Amer: 16 mL/min — ABNORMAL LOW (ref >90)
Glucose, Bld: 87 mg/dL (ref 70–99)
Potassium: 4.3 mEq/L (ref 3.5–5.1)
Sodium: 141 mEq/L (ref 135–145)

## 2012-11-13 LAB — LIPASE, BLOOD: Lipase: 64 U/L — ABNORMAL HIGH (ref 11–59)

## 2012-11-13 MED ORDER — SODIUM CHLORIDE 0.9 % IV SOLN: INTRAVENOUS | Status: DC

## 2012-11-13 MED ORDER — SODIUM CHLORIDE 0.9 % IV SOLN
INTRAVENOUS | Status: DC
  Administered 2012-11-13 – 2012-11-15 (×3): via INTRAVENOUS

## 2012-11-13 MED ORDER — IOHEXOL 300 MG/ML  SOLN
25.0000 mL | INTRAMUSCULAR | Status: AC
  Administered 2012-11-13 (×2): 25 mL via ORAL
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
## 2012-11-13 NOTE — Consult Note (Signed)
52 year old female with history of stage 3 CKD, depression, diabetes mellitus, bipolar disorder who was admitted several times for drug overdose,  was brought to the ED by her daughter for  confusion.  Patient reports a history of Stage 3 CKD followed in the past by a nephrologist in [place], [place].  She reports a a history of chronic Lithium therapy depression which was stopped many years ago because it damaged her kidneys. On 3/26 creat was 2.73mg /dl.  Patient reports that she had been taking 5-6 BCs daily for the past 18 months or so. Salicylate level was elevated to 23. Creat on admit was 3.21 mg/dl.   Renal ultrasound showed a 12.0 cm right kidney and 10.7cm left kidney with increased echogenicity. UA revealed 30 mg/dl protein.  We are asked to establish care at this time.  Past Medical History  Diagnosis Date  . Mental disorder   . Depression   . Hypertension   . Diabetes mellitus without complication   . Overdose   . Tobacco use disorder 11/13/2012   Past Surgical History  Procedure Laterality Date  . Right knee replacement      she says it was last year.   Social History:  reports that she has been smoking.  She has never used smokeless tobacco. She reports that  drinks alcohol. She reports that she uses illicit drugs. Allergies:  Allergies  Allergen Reactions  . Codeine Sulfate Anaphylaxis    Daughter called about having this allergy    History reviewed. No pertinent family history.  Medications:  Scheduled: . divalproex  1,000 mg Oral QHS  . fentaNYL  50 mcg Intravenous Once  . heparin  5,000 Units Subcutaneous Q8H  . hydrALAZINE  50 mg Oral Q6H  . iohexol  25 mL Oral Q1 Hr x 2  . metoprolol succinate  100 mg Oral Q supper  . mirtazapine  15 mg Oral QHS  . nicotine  21 mg Transdermal Daily  . pantoprazole  40 mg Oral Daily  . simvastatin  20 mg Oral QPM  . sodium chloride  3 mL Intravenous Q12H  . ziprasidone  120 mg Oral QHS   ROS: no additional renal issues other than  as descibed above, she does c/o abd pain and has back pain as well  Blood pressure 133/84, pulse 90, temperature 98.2 F (36.8 C), temperature source Oral, resp. rate 18, height 5\' 4"  (1.626 m), weight 94.9 kg (209 lb 3.5 oz), SpO2 96.00%. General appearance: alert and cooperative Head: Normocephalic, without obvious abnormality, atraumatic Eyes: negative Nose: no discharge Mouth edentulous Throat: lips, mucosa, and tongue normal; teeth and gums normal Resp: clear to auscultation bilaterally Chest wall: no tenderness Cardio: regular rate and rhythm, S1, S2 normal, no murmur, click, rub or gallop GI: soft, non-tender; bowel sounds normal; no masses,  no organomegaly Extremities: edema 1+ Skin: Skin color, texture, turgor normal. No rashes or lesions Neurologic: Grossly normal Results for orders placed during the hospital encounter of 11/10/12 (from the past 48 hour(s))  CBC     Status: Abnormal   Collection Time    11/12/12  3:35 AM      Result Value Range   WBC 5.7  4.0 - 10.5 K/uL   RBC 3.32 (*) 3.87 - 5.11 MIL/uL   Hemoglobin 10.1 (*) 12.0 - 15.0 g/dL   HCT 16.1 (*) 09.6 - 04.5 %   MCV 96.4  78.0 - 100.0 fL   MCH 30.4  26.0 - 34.0 pg   MCHC  31.6  30.0 - 36.0 g/dL   RDW 16.1 (*) 09.6 - 04.5 %   Platelets 190  150 - 400 K/uL  SALICYLATE LEVEL     Status: Abnormal   Collection Time    11/12/12  3:35 AM      Result Value Range   Salicylate Lvl <2.0 (*) 2.8 - 20.0 mg/dL  BASIC METABOLIC PANEL     Status: Abnormal   Collection Time    11/12/12  3:35 AM      Result Value Range   Sodium 137  135 - 145 mEq/L   Potassium 3.7  3.5 - 5.1 mEq/L   Chloride 108  96 - 112 mEq/L   CO2 22  19 - 32 mEq/L   Glucose, Bld 133 (*) 70 - 99 mg/dL   BUN 35 (*) 6 - 23 mg/dL   Creatinine, Ser 4.09 (*) 0.50 - 1.10 mg/dL   Calcium 9.7  8.4 - 81.1 mg/dL   GFR calc non Af Amer 18 (*) >90 mL/min   GFR calc Af Amer 20 (*) >90 mL/min   Comment:            The eGFR has been calculated     using the  CKD EPI equation.     This calculation has not been     validated in all clinical     situations.     eGFR's persistently     <90 mL/min signify     possible Chronic Kidney Disease.  BASIC METABOLIC PANEL     Status: Abnormal   Collection Time    11/13/12  3:41 AM      Result Value Range   Sodium 141  135 - 145 mEq/L   Potassium 4.3  3.5 - 5.1 mEq/L   Chloride 113 (*) 96 - 112 mEq/L   CO2 22  19 - 32 mEq/L   Glucose, Bld 87  70 - 99 mg/dL   BUN 30 (*) 6 - 23 mg/dL   Creatinine, Ser 9.14 (*) 0.50 - 1.10 mg/dL   Calcium 78.2  8.4 - 95.6 mg/dL   GFR calc non Af Amer 16 (*) >90 mL/min   GFR calc Af Amer 18 (*) >90 mL/min   Comment:            The eGFR has been calculated     using the CKD EPI equation.     This calculation has not been     validated in all clinical     situations.     eGFR's persistently     <90 mL/min signify     possible Chronic Kidney Disease.  LIPASE, BLOOD     Status: Abnormal   Collection Time    11/13/12  3:41 AM      Result Value Range   Lipase 64 (*) 11 - 59 U/L   US Abdomen Complete  11/13/2012   *RADIOLOGY REPORT*  Clinical Data:  Diffuse abdominal pain.  History hypertension, acute renal failure on chronic renal disease.  COMPLETE ABDOMINAL ULTRASOUND  Comparison:  None.  Findings:  Gallbladder:  The gallbladder is contracted and contains multiple stones.  Gallbladder wall is thickened, 3.2 mm.  A positive sonographic Murphy's sign is noted.  Common bile duct:  5.7 - 6.7 mm.  Liver:  No focal lesion identified.  Within normal limits in parenchymal echogenicity.  IVC:  Appears normal.  Pancreas:  No focal abnormality seen.  Spleen:  5.5 cm, normal in appearance.  Right Kidney:  12.0 cm in length.  No hydronephrosis.  Parenchyma is echogenic.  A simple cyst is 2.1 x 1.8 x 1.9 cm in the lower pole region.  Left Kidney:  10.7 cm in length.  Parenchyma is echogenic.  A cyst is 1.0 x 0.8 x 0.9 cm in mid pole region.  Abdominal aorta:  2.1 cm maximum diameter.   IMPRESSION:  1.  Contracted gallbladder containing numerous stones and thickened wall. 2.  Positive sonographic Murphy's sign.  The findings are consistent with acute cholecystitis. 3.  Echogenic renal parenchyma bilaterally, consistent with intrinsic renal disease. 4.  Small bilateral renal cysts. 5.  No hydronephrosis.   Original Report Authenticated By: Norva Pavlov, M.D.    Assessment:  1 Stage 4 Chronic Kidney Disease possibly due to long term lithium treatment exacerbated by chronic NSAIDs or nephrosclerosis  Plan: 1 avoidance of  NSAIDs long term discussed with pt 2 Will check SPEP, but no further renal work up will be required.  She will try to get name of her prior nephrologist for me. 3 We will arrange for outpt follow up after discharge.  Keyonta Barradas C 11/13/2012, 3:48 PM

## 2012-11-13 NOTE — Consult Note (Signed)
UNASSIGNED [facility] PATIENT.  Reason for Consult: ABM pain. Referring Physician: [facility]  Lonell Grandchild HPI: This is a 52 year old female admitted for AMS and an elevated salicylate level.  There was concern by her daughter that she was not taking her medications correctly.  She reports taking a significant amount of Goody[employer] Powders and ibuprofen for her abdominal pain.  Further evaluation with an ultrasound reveals cholelithiasis, however, her abdominal pain is diffuse.  The patient has a history of drug overdoses, dehydration, and bipolar disorder.  Past Medical History  Diagnosis Date  . Mental disorder   . Depression   . Hypertension   . Diabetes mellitus without complication   . Overdose   . Tobacco use disorder 11/13/2012    Past Surgical History  Procedure Laterality Date  . Right knee replacement      she says it was last year.    History reviewed. No pertinent family history.  Social History:  reports that she has been smoking.  She has never used smokeless tobacco. She reports that  drinks alcohol. She reports that she uses illicit drugs.  Allergies:  Allergies  Allergen Reactions  . Codeine Sulfate Anaphylaxis    Daughter called about having this allergy     Medications:  Scheduled: . divalproex  1,000 mg Oral QHS  . fentaNYL  50 mcg Intravenous Once  . heparin  5,000 Units Subcutaneous Q8H  . hydrALAZINE  50 mg Oral Q6H  . iohexol  25 mL Oral Q1 Hr x 2  . metoprolol succinate  100 mg Oral Q supper  . mirtazapine  15 mg Oral QHS  . nicotine  21 mg Transdermal Daily  . pantoprazole  40 mg Oral Daily  . simvastatin  20 mg Oral QPM  . sodium chloride  3 mL Intravenous Q12H  . ziprasidone  120 mg Oral QHS   Continuous: . sodium chloride      Results for orders placed during the hospital encounter of 11/10/12 (from the past 24 hour(s))  BASIC METABOLIC PANEL     Status: Abnormal   Collection Time    11/13/12  3:41 AM      Result Value Range   Sodium 141  135 - 145 mEq/L   Potassium 4.3  3.5 - 5.1 mEq/L   Chloride 113 (*) 96 - 112 mEq/L   CO2 22  19 - 32 mEq/L   Glucose, Bld 87  70 - 99 mg/dL   BUN 30 (*) 6 - 23 mg/dL   Creatinine, Ser 1.61 (*) 0.50 - 1.10 mg/dL   Calcium 09.6  8.4 - 04.5 mg/dL   GFR calc non Af Amer 16 (*) >90 mL/min   GFR calc Af Amer 18 (*) >90 mL/min  LIPASE, BLOOD     Status: Abnormal   Collection Time    11/13/12  3:41 AM      Result Value Range   Lipase 64 (*) 11 - 59 U/L     US Abdomen Complete  11/13/2012   *RADIOLOGY REPORT*  Clinical Data:  Diffuse abdominal pain.  History hypertension, acute renal failure on chronic renal disease.  COMPLETE ABDOMINAL ULTRASOUND  Comparison:  None.  Findings:  Gallbladder:  The gallbladder is contracted and contains multiple stones.  Gallbladder wall is thickened, 3.2 mm.  A positive sonographic Murphy's sign is noted.  Common bile duct:  5.7 - 6.7 mm.  Liver:  No focal lesion identified.  Within normal limits in parenchymal echogenicity.  IVC:  Appears normal.  Pancreas:  No focal abnormality seen.  Spleen:  5.5 cm, normal in appearance.  Right Kidney:  12.0 cm in length.  No hydronephrosis.  Parenchyma is echogenic.  A simple cyst is 2.1 x 1.8 x 1.9 cm in the lower pole region.  Left Kidney:  10.7 cm in length.  Parenchyma is echogenic.  A cyst is 1.0 x 0.8 x 0.9 cm in mid pole region.  Abdominal aorta:  2.1 cm maximum diameter.  IMPRESSION:  1.  Contracted gallbladder containing numerous stones and thickened wall. 2.  Positive sonographic Murphy's sign.  The findings are consistent with acute cholecystitis. 3.  Echogenic renal parenchyma bilaterally, consistent with intrinsic renal disease. 4.  Small bilateral renal cysts. 5.  No hydronephrosis.   Original Report Authenticated By: Norva Pavlov, M.D.    ROS:  As stated above in the HPI otherwise negative.  Blood pressure 119/64, pulse 80, temperature 98.2 F (36.8 C), temperature source Oral, resp. rate 18, height 5'  4" (1.626 m), weight 209 lb 3.5 oz (94.9 kg), SpO2 96.00%.    PE: Gen: NAD, Alert and Oriented HEENT:  [place]/AT, EOMI Neck: Supple, no LAD Lungs: CTA Bilaterally CV: RRR without M/G/R ABM: Soft, lower abdominal pain, +BS Ext: No C/C/E  Assessment/Plan: 1) Lower abdominal pain. 2) NSAID use. 3) Cholelithiasis. 4) Constipation.   Obtaining a history from the patient is difficult.  Her pain is lower in her abdomen at this time and she reports that it is improved with bowel movements at times.  It may be that her pain is secondary to constipation, however, I think it is prudent to perform an EGD with her history of NSAID use.  Plan: 1) EGD tomorrow. 2) If the EGD is negative, focus on constipation.  Shakeem Stern D 11/13/2012, 2:56 PM

## 2012-11-13 NOTE — Consult Note (Signed)
Patient interviewed and examined, agree with PA note above. Pt is a poor historian.  She really could not localize her abd pain when I interviewed her. Her abdomen is mildly diffusely tender.  I doubt she has acute cholecystitis with nl WBC, no fever, no nausea and no localizing tenderness.  She may have symptomatic chronic cholecystitis. I am concerned about PUD with excessive ASA use, or other pathology. Will proceed with non contrast CT and I would like an EGD. If negative would consider cholecystectomy.  Mariella Saa MD, FACS  11/13/2012 3:27 PM

## 2012-11-13 NOTE — Progress Notes (Signed)
[facility] PROGRESS NOTE  Jennifer Lawson AVW:[id number] DOB: [birth year] DOA: 11/10/2012 PCP: Jackie Plum, MD  Brief narrative:  52 year old female with history of depression, diabetes mellitus, bipolar disorder who was admitted several times in the past for drug overdose, stage IV CKD was brought to the ED by her daughter for Increased confusion.  As per daughter, whom i spoke to over the phone today, Patient taking a lot of BC powders and smoking more almost 3-4 PPD. Patient behaving differently , confused , sluggish, and tremulous for past 2 days. Patient also c/o abdominal pain for over a month and taking BC powders ( 10-12 a day for abdominal pain and headache) . Daughter not sure if she is taking her medicine appropriately. Patient noted to have elevated salicylate level and mild anion gap metabolic acidosis. No acute neurological deficit noted. She was admitted to ICU for closer monitoring.   Assessment/Plan:  Overdose of salicylate , likely chronic  Her salicylate level was 23 on admission . No neurological abnormality on exam  . CT head negative. Mentation is clear.  Admitted to ICU . utox negative.  -Repeat salicylate level is normal .  -psych consult called for her bipolar disorder and assess capacity. to assess capacity. Patient denies suicidal ideation. Sitter for safety discontinued . Psych recommends patient to have capacity and does not need inpatient psych admission. -no complications of salicylate overdose so far . She had some TWI on EKG on admission. repeat EKG this am normal . Lytes stable. Her renal function has worsened from baseline. given D5 and 2 amp fof bicarb so far.  MMSE done by psych SW which was normal. Daughter wishes patient to go to ALF. SW ware. transfer out to tele  acute on CKD (chronic kidney disease)  Baseline creatinine around 2.8.  3.2 on admission. No previous renal w/up of evaluation. creatinine 3/12 today. i have consulted [facility]  for evaluation and needs outpatient follow up abdominal US showed intrinsic renal disease  Metabolic acidosis  Bicarb level wnl  Repeat salicylate level normal   Anemia  hb around 9-10 recently. Was normal 8 months back. Possibly ACD. iron panel, B12 normal .stool for occult blood pending. Given excessive use of BC powder and c/o stomach pain erosive gastritis is a possibility .    abdominal pain  reports vague abdominal discomfort  Will obtain US abdomen suggests acute cholecystitis. LFTs unremarkable. CCS consulted. Recommend symptoms ad physical exam not contributing to US findings and do not feel patient  needs intervention. Will obtain CT with contrast. She will benefit from GI consult given her pain and increased use of BC powder over time.   Tobacco abuse  as per daughter, she was placed on chantix as outpatient but had agitation and behavioral disturbances. ordered nicotine patch. Counseled on cessation.  HTN (hypertension)  uncontrolled  continue toprol  BP better after increasing hydralazine 50 mg q 6h   Bipolar disorder  Continue  home meds  Psych following appreciate psych recommendations  DVT prophylaxis   Diet: low sodium      Code Status: Full code Family Communication:  None, Discussed with Daughter Aris Lot over the phone [phone]) on 7/6   Disposition Plan: pending further w/up including CT abdomen and possibly GI consult for abdominal pain. possibly to ALF   Consultants:  CCS  psychiatry  Procedures:  none  Antibiotics: NONE   HPI/Subjective: No overnight issues. [place] abd done suggestive of acute cholecystitis.   Objective: Filed Vitals:   11/13/12 0400  11/13/12 0500 11/13/12 0600 11/13/12 0800  BP: 123/81  133/81 119/64  Pulse: 79  77 80  Temp:  98 F (36.7 C)  98.2 F (36.8 C)  TempSrc:  Oral  Oral  Resp: 17  16 18   Height:      Weight:  94.9 kg (209 lb 3.5 oz)    SpO2: 95%  95% 96%    Intake/Output Summary (Last 24 hours) at  11/13/12 1430 Last data filed at 11/13/12 1013  Gross per 24 hour  Intake   1733 ml  Output   3750 ml  Net  -2017 ml   Filed Weights   11/12/12 0600 11/13/12 0500  Weight: 96.7 kg (213 lb 3 oz) 94.9 kg (209 lb 3.5 oz)    Exam:  General: Middle aged female in NAD  HEENT: no pallor, moist oral mucosa  Cardiovascular: NS1&S2, no murmurs  Respiratory: clear b/l, no added sounds  Abdomen: soft, ND, winces to palpation diffusely, BS+  Musculoskeletal: Warm, no edema  CNS: AAOX3, non focal    Data Reviewed: Basic Metabolic Panel:  Recent Labs Lab 11/10/12 1030 11/10/12 1630 11/11/12 0400 11/11/12 1045 11/12/12 0335 11/13/12 0341  NA 136 137 136 138 137 141  K 4.3 3.6 3.8 3.9 3.7 4.3  CL 105 106 108 110 108 113*  CO2 15* 19 18* 17* 22 22  GLUCOSE 81 79 117* 99 133* 87  BUN 57* 54* 46* 45* 35* 30*  CREATININE 3.21* 3.22* 2.93* 2.86* 2.87* 3.12*  CALCIUM 9.6 10.1 9.6 9.8 9.7 10.4  MG  --  2.8*  --   --   --   --    Liver Function Tests:  Recent Labs Lab 11/10/12 1030 11/11/12 0400  AST 28 16  ALT 13 10  ALKPHOS 52 44  BILITOT 0.1* 0.1*  PROT 7.4 6.2  ALBUMIN 3.3* 2.7*    Recent Labs Lab 11/13/12 0341  LIPASE 64*   No results found for this basename: AMMONIA,  in the last 168 hours CBC:  Recent Labs Lab 11/10/12 1030 11/11/12 0400 11/12/12 0335  WBC 7.6 5.5 5.7  NEUTROABS 5.1  --   --   HGB 10.6* 9.4* 10.1*  HCT 32.6* 29.4* 32.0*  MCV 95.6 95.5 96.4  PLT 211 194 190   Cardiac Enzymes: No results found for this basename: CKTOTAL, CKMB, CKMBINDEX, TROPONINI,  in the last 168 hours BNP (last 3 results) No results found for this basename: PROBNP,  in the last 8760 hours CBG: No results found for this basename: GLUCAP,  in the last 168 hours  Recent Results (from the past 240 hour(s))  MRSA PCR SCREENING     Status: None   Collection Time    11/10/12  5:57 PM      Result Value Range Status   MRSA by PCR NEGATIVE  NEGATIVE Final   Comment:             The GeneXpert MRSA Assay (FDA     approved for NASAL specimens     only), is one component of a     comprehensive MRSA colonization     surveillance program. It is not     intended to diagnose MRSA     infection nor to guide or     monitor treatment for     MRSA infections.     Studies: US Abdomen Complete  11/13/2012   *RADIOLOGY REPORT*  Clinical Data:  Diffuse abdominal pain.  History hypertension, acute renal  failure on chronic renal disease.  COMPLETE ABDOMINAL ULTRASOUND  Comparison:  None.  Findings:  Gallbladder:  The gallbladder is contracted and contains multiple stones.  Gallbladder wall is thickened, 3.2 mm.  A positive sonographic Murphy's sign is noted.  Common bile duct:  5.7 - 6.7 mm.  Liver:  No focal lesion identified.  Within normal limits in parenchymal echogenicity.  IVC:  Appears normal.  Pancreas:  No focal abnormality seen.  Spleen:  5.5 cm, normal in appearance.  Right Kidney:  12.0 cm in length.  No hydronephrosis.  Parenchyma is echogenic.  A simple cyst is 2.1 x 1.8 x 1.9 cm in the lower pole region.  Left Kidney:  10.7 cm in length.  Parenchyma is echogenic.  A cyst is 1.0 x 0.8 x 0.9 cm in mid pole region.  Abdominal aorta:  2.1 cm maximum diameter.  IMPRESSION:  1.  Contracted gallbladder containing numerous stones and thickened wall. 2.  Positive sonographic Murphy's sign.  The findings are consistent with acute cholecystitis. 3.  Echogenic renal parenchyma bilaterally, consistent with intrinsic renal disease. 4.  Small bilateral renal cysts. 5.  No hydronephrosis.   Original Report Authenticated By: Norva Pavlov, M.D.    Scheduled Meds: . divalproex  1,000 mg Oral QHS  . fentaNYL  50 mcg Intravenous Once  . heparin  5,000 Units Subcutaneous Q8H  . hydrALAZINE  50 mg Oral Q6H  . metoprolol succinate  100 mg Oral Q supper  . mirtazapine  15 mg Oral QHS  . nicotine  21 mg Transdermal Daily  . pantoprazole  40 mg Oral Daily  . simvastatin  20 mg Oral  QPM  . sodium chloride  3 mL Intravenous Q12H  . ziprasidone  120 mg Oral QHS   Continuous Infusions: . sodium chloride        Time spent: 25 minutes    Ulric Salzman  [facility] Pager [phone] If 7PM-7AM, please contact night-coverage at [email or web address], password [facility] 11/13/2012, 2:30 PM  LOS: 3 days

## 2012-11-13 NOTE — Progress Notes (Signed)
PT Cancellation Note  Patient Details Name: Jennifer Lawson MRN: [id number] DOB: April 05, 1961   Cancelled Treatment:    Reason Eval/Treat Not Completed: Other (comment) (pt refused). Will follow.    Ralene Bathe Kistler 11/13/2012, 2:37 PM [phone]

## 2012-11-14 ENCOUNTER — Encounter (HOSPITAL_COMMUNITY): Payer: Self-pay | Admitting: *Deleted

## 2012-11-14 ENCOUNTER — Encounter (HOSPITAL_COMMUNITY)
Admission: EM | Disposition: A | Discharge status: Home / Self Care | Payer: Self-pay | Source: Home / Self Care | Attending: Internal Medicine

## 2012-11-14 DIAGNOSIS — N184 Chronic kidney disease, stage 4 (severe): Secondary | ICD-10-CM

## 2012-11-14 DIAGNOSIS — N289 Disorder of kidney and ureter, unspecified: Secondary | ICD-10-CM

## 2012-11-14 DIAGNOSIS — Z5189 Encounter for other specified aftercare: Secondary | ICD-10-CM

## 2012-11-14 HISTORY — PX: ESOPHAGOGASTRODUODENOSCOPY: SHX5428

## 2012-11-14 LAB — RENAL FUNCTION PANEL
Albumin: 2.3 g/dL — ABNORMAL LOW (ref 3.5–5.2)
BUN: 30 mg/dL — ABNORMAL HIGH (ref 6–23)
CO2: 22 mEq/L (ref 19–32)
Calcium: 10.5 mg/dL (ref 8.4–10.5)
Chloride: 114 mEq/L — ABNORMAL HIGH (ref 96–112)
Creatinine, Ser: 2.78 mg/dL — ABNORMAL HIGH (ref 0.50–1.10)
GFR calc Af Amer: 21 mL/min — ABNORMAL LOW (ref >90)
GFR calc non Af Amer: 18 mL/min — ABNORMAL LOW (ref >90)
Glucose, Bld: 92 mg/dL (ref 70–99)
Phosphorus: 3.5 mg/dL (ref 2.3–4.6)
Potassium: 4.4 mEq/L (ref 3.5–5.1)
Sodium: 142 mEq/L (ref 135–145)

## 2012-11-14 SURGERY — EGD (ESOPHAGOGASTRODUODENOSCOPY)
Anesthesia: Moderate Sedation | Laterality: Left

## 2012-11-14 MED ORDER — LIDOCAINE VISCOUS 2 % MT SOLN
OROMUCOSAL | Status: DC | PRN
  Administered 2012-11-14: 5 mL via OROMUCOSAL

## 2012-11-14 MED ORDER — FENTANYL CITRATE 0.05 MG/ML IJ SOLN
INTRAMUSCULAR | Status: DC | PRN
  Administered 2012-11-14: 25 ug via INTRAVENOUS

## 2012-11-14 MED ORDER — MIDAZOLAM HCL 10 MG/2ML IJ SOLN
INTRAMUSCULAR | Status: DC | PRN
  Administered 2012-11-14: 1 mg via INTRAVENOUS
  Administered 2012-11-14: 2 mg via INTRAVENOUS

## 2012-11-14 NOTE — Progress Notes (Signed)
[facility] PROGRESS NOTE  Ingra Rother ZOX:[id number] DOB: 03/23/1961 DOA: 11/10/2012 PCP: Jackie Plum, MD  Assessment/Plan: Overdose of salicylate , likely chronic  Her salicylate level was 23 on admission .  Patient has been counseled on avoiding NSAIDs due to CKD stage 4  acute on CKD (chronic kidney disease)  Nephrology on board and managing.  Currently condition improved and nephrology has signed off. Patient to continue avoiding aspirin.  Metabolic acidosis  Bicarb level wnl  Repeat salicylate level normal  Most likely due to # 1  Anemia  hb around 9-10 recently.  Iron panel reviewed and suspect anemia 2ary to Chronic Kidney disease  abdominal pain  reports vague abdominal discomfort  CCS on board and plans are for EGD. Will await further recommendations from CCS  Tobacco abuse  as per daughter, she was placed on chantix as outpatient but had agitation and behavioral disturbances. ordered nicotine patch. Counseled on cessation.   HTN (hypertension)  Has fluctuated. Will continue to monitor  continue toprol  BP better after increasing hydralazine 50 mg q 6h   Bipolar disorder  Continue home meds  Psych consulted   Code Status: full Family Communication: no family at bedside. Disposition Plan: Pending further evaluation and recommendations from CCS   Consultants:  CCS  Nephrology  Psychiatry  Procedures:  EGD pending  Antibiotics:  None  HPI/Subjective: Patient has no new complaints. No acute issues overnight.  Objective: Filed Vitals:   11/13/12 0800 11/13/12 1500 11/13/12 2132 11/14/12 0612  BP: 119/64 133/84 160/87 128/70  Pulse: 80 90 86 87  Temp: 98.2 F (36.8 C)  98.6 F (37 C) 98.1 F (36.7 C)  TempSrc: Oral  Oral Oral  Resp: 18 18 18    Height:      Weight:      SpO2: 96%  98% 99%    Intake/Output Summary (Last 24 hours) at 11/14/12 1315 Last data filed at 11/14/12 0600  Gross per 24 hour  Intake    675 ml   Output      0 ml  Net    675 ml   Filed Weights   11/12/12 0600 11/13/12 0500  Weight: 96.7 kg (213 lb 3 oz) 94.9 kg (209 lb 3.5 oz)    Exam:   General:  Pt in NAD, Alert and Awake  Cardiovascular: RRR, no MRG  Respiratory: CTA BL, no wheezes  Abdomen: soft, NT, ND  Musculoskeletal: no cyanosis or clubbing   Data Reviewed: Basic Metabolic Panel:  Recent Labs Lab 11/10/12 1030 11/10/12 1630 11/11/12 0400 11/11/12 1045 11/12/12 0335 11/13/12 0341 11/14/12 0510  NA 136 137 136 138 137 141 142  K 4.3 3.6 3.8 3.9 3.7 4.3 4.4  CL 105 106 108 110 108 113* 114*  CO2 15* 19 18* 17* 22 22 22   GLUCOSE 81 79 117* 99 133* 87 92  BUN 57* 54* 46* 45* 35* 30* 30*  CREATININE 3.21* 3.22* 2.93* 2.86* 2.87* 3.12* 2.78*  CALCIUM 9.6 10.1 9.6 9.8 9.7 10.4 10.5  MG  --  2.8*  --   --   --   --   --   PHOS  --   --   --   --   --   --  3.5   Liver Function Tests:  Recent Labs Lab 11/10/12 1030 11/11/12 0400 11/14/12 0510  AST 28 16  --   ALT 13 10  --   ALKPHOS 52 44  --   BILITOT 0.1* 0.1*  --  PROT 7.4 6.2  --   ALBUMIN 3.3* 2.7* 2.3*    Recent Labs Lab 11/13/12 0341  LIPASE 64*   No results found for this basename: AMMONIA,  in the last 168 hours CBC:  Recent Labs Lab 11/10/12 1030 11/11/12 0400 11/12/12 0335  WBC 7.6 5.5 5.7  NEUTROABS 5.1  --   --   HGB 10.6* 9.4* 10.1*  HCT 32.6* 29.4* 32.0*  MCV 95.6 95.5 96.4  PLT 211 194 190   Cardiac Enzymes: No results found for this basename: CKTOTAL, CKMB, CKMBINDEX, TROPONINI,  in the last 168 hours BNP (last 3 results) No results found for this basename: PROBNP,  in the last 8760 hours CBG: No results found for this basename: GLUCAP,  in the last 168 hours  Recent Results (from the past 240 hour(s))  MRSA PCR SCREENING     Status: None   Collection Time    11/10/12  5:57 PM      Result Value Range Status   MRSA by PCR NEGATIVE  NEGATIVE Final   Comment:            The GeneXpert MRSA Assay (FDA      approved for NASAL specimens     only), is one component of a     comprehensive MRSA colonization     surveillance program. It is not     intended to diagnose MRSA     infection nor to guide or     monitor treatment for     MRSA infections.     Studies: Ct Abdomen Pelvis Wo Contrast  11/13/2012   *RADIOLOGY REPORT*  Clinical Data: Diffuse abdominal pain.  CT ABDOMEN AND PELVIS WITHOUT CONTRAST  Technique:  Multidetector CT imaging of the abdomen and pelvis was performed following the standard protocol without intravenous contrast.  Comparison: Ultrasound 11/13/2012  Findings: Lung bases are clear.  No pleural effusions.  Small pericardial effusion.  Heart is normal size.  Gallstones are noted within the gallbladder.  No CT evidence for acute cholecystitis.  Liver, spleen, pancreas, adrenals and kidneys have an unremarkable unenhanced appearance.  No renal or ureteral stones.  No hydronephrosis.  Urinary bladder grossly unremarkable. Multiple calcified phleboliths in the anatomic pelvis.  Uterus and adnexa have an unremarkable unenhanced appearance.  Appendix is visualized and is normal.  Moderate stool burden throughout the colon.  Small bowel is decompressed.  Trace free fluid in the pelvis.  No free air or adenopathy.  Aorta is normal caliber.  Degenerative changes in the lower thoracic and lower lumbar spine. No acute bony abnormality.  IMPRESSION: Cholelithiasis.  Moderate stool burden throughout the colon.  Small pericardial effusion.   Original Report Authenticated By: Charlett Nose, M.D.   US Abdomen Complete  11/13/2012   *RADIOLOGY REPORT*  Clinical Data:  Diffuse abdominal pain.  History hypertension, acute renal failure on chronic renal disease.  COMPLETE ABDOMINAL ULTRASOUND  Comparison:  None.  Findings:  Gallbladder:  The gallbladder is contracted and contains multiple stones.  Gallbladder wall is thickened, 3.2 mm.  A positive sonographic Murphy's sign is noted.  Common bile duct:  5.7 -  6.7 mm.  Liver:  No focal lesion identified.  Within normal limits in parenchymal echogenicity.  IVC:  Appears normal.  Pancreas:  No focal abnormality seen.  Spleen:  5.5 cm, normal in appearance.  Right Kidney:  12.0 cm in length.  No hydronephrosis.  Parenchyma is echogenic.  A simple cyst is 2.1 x 1.8 x 1.9  cm in the lower pole region.  Left Kidney:  10.7 cm in length.  Parenchyma is echogenic.  A cyst is 1.0 x 0.8 x 0.9 cm in mid pole region.  Abdominal aorta:  2.1 cm maximum diameter.  IMPRESSION:  1.  Contracted gallbladder containing numerous stones and thickened wall. 2.  Positive sonographic Murphy's sign.  The findings are consistent with acute cholecystitis. 3.  Echogenic renal parenchyma bilaterally, consistent with intrinsic renal disease. 4.  Small bilateral renal cysts. 5.  No hydronephrosis.   Original Report Authenticated By: Norva Pavlov, M.D.    Scheduled Meds: . divalproex  1,000 mg Oral QHS  . fentaNYL  50 mcg Intravenous Once  . heparin  5,000 Units Subcutaneous Q8H  . hydrALAZINE  50 mg Oral Q6H  . metoprolol succinate  100 mg Oral Q supper  . mirtazapine  15 mg Oral QHS  . nicotine  21 mg Transdermal Daily  . pantoprazole  40 mg Oral Daily  . simvastatin  20 mg Oral QPM  . sodium chloride  3 mL Intravenous Q12H  . ziprasidone  120 mg Oral QHS   Continuous Infusions: . sodium chloride 75 mL/hr at 11/13/12 1817  . sodium chloride 20 mL/hr (11/14/12 0854)    Principal Problem:   Overdose of salicylate Active Problems:   Drug overdose   Bipolar disorder   ARF (acute renal failure)   CKD (chronic kidney disease)   HTN (hypertension)   Uncontrolled hypertension   Tobacco use disorder    Time spent: > 35 minutes    Penny Pia  [facility] Pager [phone]. If 7PM-7AM, please contact night-coverage at [email or web address], password [facility] 11/14/2012, 1:15 PM  LOS: 4 days

## 2012-11-14 NOTE — Progress Notes (Signed)
Assessment:  1 Stage 4 Chronic Kidney Disease possibly due to long term lithium treatment exacerbated by chronic NSAIDs or nephrosclerosis  Plan:  1 avoidance of NSAIDs long term discussed with pt again; will need alternative medication for headaches (she states " Tylenol does not work") 2 She will try to get name of her prior nephrologist for me.  3 We will arrange for outpt follow up after discharge.  We will sign off.  Appointment at [facility] on August 22 at 9 AM  Subjective: Interval History: None overnight Objective: Vital signs in last 24 hours: Temp:  [98.1 F (36.7 C)-98.6 F (37 C)] 98.1 F (36.7 C) (07/09 0612) Pulse Rate:  [86-90] 87 (07/09 0612) Resp:  [18] 18 (07/08 1500) BP: (128-160)/(70-87) 128/70 mmHg (07/09 0612) SpO2:  [98 %-99 %] 99 % (07/09 0612) Weight change:  Intake/Output from previous day: 07/08 0701 - 07/09 0700 In: 828 [P.O.:750; I.V.:78] Out: 500 [Urine:500] Intake/Output this shift:   General appearance: alert and cooperative Resp: clear to auscultation bilaterally Chest wall: no tenderness Cardio: regular rate and rhythm, S1, S2 normal, no murmur, click, rub or gallop Extremities: edema 1+ Lab Results:  Recent Labs  11/12/12 0335  WBC 5.7  HGB 10.1*  HCT 32.0*  PLT 190   BMET:  Recent Labs  11/13/12 0341 11/14/12 0510  NA 141 142  K 4.3 4.4  CL 113* 114*  CO2 22 22  GLUCOSE 87 92  BUN 30* 30*  CREATININE 3.12* 2.78*  CALCIUM 10.4 10.5   No results found for this basename: PTH,  in the last 72 hours Iron Studies:  Recent Labs  11/11/12 1300  IRON 90  TIBC 276   Studies/Results: Ct Abdomen Pelvis Wo Contrast  11/13/2012   *RADIOLOGY REPORT*  Clinical Data: Diffuse abdominal pain.  CT ABDOMEN AND PELVIS WITHOUT CONTRAST  Technique:  Multidetector CT imaging of the abdomen and pelvis was performed following the standard protocol without intravenous contrast.  Comparison: Ultrasound 11/13/2012  Findings:  Lung bases are clear.  No pleural effusions.  Small pericardial effusion.  Heart is normal size.  Gallstones are noted within the gallbladder.  No CT evidence for acute cholecystitis.  Liver, spleen, pancreas, adrenals and kidneys have an unremarkable unenhanced appearance.  No renal or ureteral stones.  No hydronephrosis.  Urinary bladder grossly unremarkable. Multiple calcified phleboliths in the anatomic pelvis.  Uterus and adnexa have an unremarkable unenhanced appearance.  Appendix is visualized and is normal.  Moderate stool burden throughout the colon.  Small bowel is decompressed.  Trace free fluid in the pelvis.  No free air or adenopathy.  Aorta is normal caliber.  Degenerative changes in the lower thoracic and lower lumbar spine. No acute bony abnormality.  IMPRESSION: Cholelithiasis.  Moderate stool burden throughout the colon.  Small pericardial effusion.   Original Report Authenticated By: Charlett Nose, M.D.   US Abdomen Complete  11/13/2012   *RADIOLOGY REPORT*  Clinical Data:  Diffuse abdominal pain.  History hypertension, acute renal failure on chronic renal disease.  COMPLETE ABDOMINAL ULTRASOUND  Comparison:  None.  Findings:  Gallbladder:  The gallbladder is contracted and contains multiple stones.  Gallbladder wall is thickened, 3.2 mm.  A positive sonographic Murphy's sign is noted.  Common bile duct:  5.7 - 6.7 mm.  Liver:  No focal lesion identified.  Within normal limits in parenchymal echogenicity.  IVC:  Appears normal.  Pancreas:  No focal abnormality seen.  Spleen:  5.5 cm, normal in appearance.  Right Kidney:  12.0 cm in length.  No hydronephrosis.  Parenchyma is echogenic.  A simple cyst is 2.1 x 1.8 x 1.9 cm in the lower pole region.  Left Kidney:  10.7 cm in length.  Parenchyma is echogenic.  A cyst is 1.0 x 0.8 x 0.9 cm in mid pole region.  Abdominal aorta:  2.1 cm maximum diameter.  IMPRESSION:  1.  Contracted gallbladder containing numerous stones and thickened wall. 2.  Positive  sonographic Murphy's sign.  The findings are consistent with acute cholecystitis. 3.  Echogenic renal parenchyma bilaterally, consistent with intrinsic renal disease. 4.  Small bilateral renal cysts. 5.  No hydronephrosis.   Original Report Authenticated By: Norva Pavlov, M.D.   Scheduled: . divalproex  1,000 mg Oral QHS  . fentaNYL  50 mcg Intravenous Once  . heparin  5,000 Units Subcutaneous Q8H  . hydrALAZINE  50 mg Oral Q6H  . metoprolol succinate  100 mg Oral Q supper  . mirtazapine  15 mg Oral QHS  . nicotine  21 mg Transdermal Daily  . pantoprazole  40 mg Oral Daily  . simvastatin  20 mg Oral QPM  . sodium chloride  3 mL Intravenous Q12H  . ziprasidone  120 mg Oral QHS    LOS: 4 days   Yolonda Purtle C 11/14/2012,8:44 AM

## 2012-11-14 NOTE — Progress Notes (Signed)
Clinical Social Work  CSW attempted to meet with patient but patient currently sleeping. CSW will follow up at later time.  Avabella Wailes, LCSW [phone] 

## 2012-11-14 NOTE — Progress Notes (Signed)
Patient ID: Jennifer Lawson, female   DOB: [birth year], 52 y.o.   MRN: [id number]    Subjective: Still drowsy but more alert and conversant today. States abdominal pain is better, just mild and indicates RLQ  Objective: Vital signs in last 24 hours: Temp:  [98.1 F (36.7 C)-98.6 F (37 C)] 98.1 F (36.7 C) (07/09 0612) Pulse Rate:  [86-90] 87 (07/09 0612) Resp:  [18] 18 (07/08 1500) BP: (128-160)/(70-87) 128/70 mmHg (07/09 0612) SpO2:  [98 %-99 %] 99 % (07/09 0612)    Intake/Output from previous day: 07/08 0701 - 07/09 0700 In: 1428 [P.O.:750; I.V.:678] Out: 500 [Urine:500] Intake/Output this shift:    General appearance: Drowsy but responsive and appropriate GI: normal findings: soft, non-tender  Lab Results:   Recent Labs  11/12/12 0335  WBC 5.7  HGB 10.1*  HCT 32.0*  PLT 190   BMET  Recent Labs  11/13/12 0341 11/14/12 0510  NA 141 142  K 4.3 4.4  CL 113* 114*  CO2 22 22  GLUCOSE 87 92  BUN 30* 30*  CREATININE 3.12* 2.78*  CALCIUM 10.4 10.5     Studies/Results: Ct Abdomen Pelvis Wo Contrast  11/13/2012   *RADIOLOGY REPORT*  Clinical Data: Diffuse abdominal pain.  CT ABDOMEN AND PELVIS WITHOUT CONTRAST  Technique:  Multidetector CT imaging of the abdomen and pelvis was performed following the standard protocol without intravenous contrast.  Comparison: Ultrasound 11/13/2012  Findings: Lung bases are clear.  No pleural effusions.  Small pericardial effusion.  Heart is normal size.  Gallstones are noted within the gallbladder.  No CT evidence for acute cholecystitis.  Liver, spleen, pancreas, adrenals and kidneys have an unremarkable unenhanced appearance.  No renal or ureteral stones.  No hydronephrosis.  Urinary bladder grossly unremarkable. Multiple calcified phleboliths in the anatomic pelvis.  Uterus and adnexa have an unremarkable unenhanced appearance.  Appendix is visualized and is normal.  Moderate stool burden throughout the colon.  Small bowel is  decompressed.  Trace free fluid in the pelvis.  No free air or adenopathy.  Aorta is normal caliber.  Degenerative changes in the lower thoracic and lower lumbar spine. No acute bony abnormality.  IMPRESSION: Cholelithiasis.  Moderate stool burden throughout the colon.  Small pericardial effusion.   Original Report Authenticated By: Charlett Nose, M.D.   US Abdomen Complete  11/13/2012   *RADIOLOGY REPORT*  Clinical Data:  Diffuse abdominal pain.  History hypertension, acute renal failure on chronic renal disease.  COMPLETE ABDOMINAL ULTRASOUND  Comparison:  None.  Findings:  Gallbladder:  The gallbladder is contracted and contains multiple stones.  Gallbladder wall is thickened, 3.2 mm.  A positive sonographic Murphy's sign is noted.  Common bile duct:  5.7 - 6.7 mm.  Liver:  No focal lesion identified.  Within normal limits in parenchymal echogenicity.  IVC:  Appears normal.  Pancreas:  No focal abnormality seen.  Spleen:  5.5 cm, normal in appearance.  Right Kidney:  12.0 cm in length.  No hydronephrosis.  Parenchyma is echogenic.  A simple cyst is 2.1 x 1.8 x 1.9 cm in the lower pole region.  Left Kidney:  10.7 cm in length.  Parenchyma is echogenic.  A cyst is 1.0 x 0.8 x 0.9 cm in mid pole region.  Abdominal aorta:  2.1 cm maximum diameter.  IMPRESSION:  1.  Contracted gallbladder containing numerous stones and thickened wall. 2.  Positive sonographic Murphy's sign.  The findings are consistent with acute cholecystitis. 3.  Echogenic renal parenchyma bilaterally, consistent  with intrinsic renal disease. 4.  Small bilateral renal cysts. 5.  No hydronephrosis.   Original Report Authenticated By: Norva Pavlov, M.D.    Anti-infectives: Anti-infectives   None      Assessment/Plan: Abdominal pain, poorly localized.  Gallstones but no evidence of acute cholecystitis clinically or on CT. For EGD today. Stable/improved    LOS: 4 days    Keylin Ferryman T 11/14/2012

## 2012-11-14 NOTE — Op Note (Signed)
[facility] [address] [place], [address]   OPERATIVE PROCEDURE REPORT  PATIENT :Amelita, Risinger  MR#: [id number] BIRTHDATE :[birth year] GENDER: Female ENDOSCOPIST: Dr.  Lorenza Burton, MD ASSISTANT:   Jadene Pierini, technician Harold Barban, RN PROCEDURE DATE: 2012-11-18 PRE-PROCEDURE PREPERATION: Patient fasted for 4 hours prior to procedure. PRE-PROCEDURE PHYSICAL: Patient has stable vital signs.  Neck is supple.  There is no JVD, thyromegaly or LAD.  Chest clear to auscultation.  S1 and S2 regular.  Abdomen soft, obese, non-distended, non-tender with NABS. PROCEDURE:     EGD with multiple cold biopsies. ASA CLASS:     Class III INDICATIONS:     Epigastric pain; history of NSAID use and anemia.Marland Kitchen  MEDICATIONS:     Fentanyl 25 mcg and Versed 3 mg IV TOPICAL ANESTHETIC:   Viscous Xylocaine-15 cc PO.  DESCRIPTION OF PROCEDURE: After the risks benefits and alternatives of the procedure were thoroughly explained, informed consent was obtained. The PENTAX GASTOROSCOPE 191478  was introduced through the mouth and advanced to the second portion of the duodenum , without limitations. The instrument was slowly withdrawn as the mucosa was fully examined.   The esophagus and the GEJ appeared normal. Moderate diffuse gastritis with multiple antral erosions was noted. a small ulcer was noted in the antrum and biopsies were done. Multiple erosions were noted in the duodenal bulb. The small bowel distal to the bulb apperaed normal upo 60 cm. No masses or polyps were noted. Retroflexed views revealed no abnormalities. The scope was then withdrawn from the patient and the procedure terminated. The patient tolerated the procedure without immediate complications.  IMPRESSION:  1) Normal appearing esophagus and GEJ. 2) Moderate diffuse gastritis with antral erosions and a small antral ulcer-biopsies done. 3) Duodenal bulb erosions noted-probably secondary due to NSAID  use.   RECOMMENDATIONS:     1.  Anti-reflux regimen to be followed. 2.  Await pathology results. 3.  Continue PPI's. 4. AVOID ALL NSAIDS.  REPEAT EXAM:  No recall planned for now.  DISCHARGE INSTRUCTIONS: standard discharge instructions given. _______________________________ eSigned:  Dr. Lorenza Burton, MD 2012/11/18 6:48 PM   CPT CODES:     [phone], EGD with biopsy  DIAGNOSIS CODES:     789.06 ABM pain   CC:  PATIENT NAME:  Marliss, Buttacavoli MR#: [id number]

## 2012-11-14 NOTE — Progress Notes (Signed)
PT Cancellation Note  Patient Details Name: Jennifer Lawson MRN: [id number] DOB: 1961/01/30   Cancelled Treatment:    Reason Eval/Treat Not Completed: Medical issues which prohibited therapy (pt for EGD today. F/U in AM)   Rada Hay 11/14/2012, 3:17 PM

## 2012-11-15 ENCOUNTER — Encounter (HOSPITAL_COMMUNITY): Payer: Self-pay | Admitting: Gastroenterology

## 2012-11-15 DIAGNOSIS — F17200 Nicotine dependence, unspecified, uncomplicated: Secondary | ICD-10-CM

## 2012-11-15 LAB — PARATHYROID HORMONE, INTACT (NO CA): PTH: 124.4 pg/mL — ABNORMAL HIGH (ref 14.0–72.0)

## 2012-11-15 MED ORDER — HYDRALAZINE HCL 50 MG PO TABS: 50.0000 mg | ORAL_TABLET | Freq: Four times a day (QID) | ORAL | Status: DC

## 2012-11-15 NOTE — Progress Notes (Signed)
Clinical Social Work Progress Note PSYCHIATRY SERVICE LINE 11/15/2012  Patient:  TAELYNN MCELHANNON  Account:  [id number]  Admit Date:  11/10/2012  Clinical Social Worker:  Unk Lightning, LCSW  Date/Time:  11/15/2012 11:15 AM  Review of Patient  Overall Medical Condition:   Patient reports she is feeling better.   Participation Level:  Minimal  Participation Quality  Drowsy   Other Participation Quality:   Patient drowsy and minimally engaged throughout session.   Affect  Flat   Cognitive  Appropriate   Reaction to Medications/Concerns:   None reported   Modes of Intervention  Support   Summary of Progress/Plan at Discharge   CSW met with patient at bedside. Patient laying in bed and watching TV. Patient reports she is feeling better and was getting rest.    Patient reports no depression or mental health concerns. Patient reports she is ready to DC. Psych MD recommends that patient continue to follow up with outpatient psychiatrist. CSW attempted to speak with patient regarding her psychiatrist and if she wanted to follow up with him or have treatment at another facility.    Patient too drowsy and asked CSW to come back later to discuss outpatient plans. CSW will continue to follow.

## 2012-11-15 NOTE — Progress Notes (Signed)
PT Cancellation Note  Patient Details Name: Jennifer Lawson MRN: [id number] DOB: [birth year]   Cancelled Treatment:    Reason Eval/Treat Not Completed: PT screened, no needs identified, will sign off.    Ralene Bathe Kistler 11/15/2012, 2:22 PM [phone]

## 2012-11-15 NOTE — Discharge Summary (Signed)
Physician Discharge Summary  Jennifer Lawson ZHY:[id number] DOB: 1961/02/15 DOA: 11/10/2012  PCP: Jackie Plum, MD  Admit date: 11/10/2012 Discharge date: 11/15/2012  Time spent: > 35 minutes  Recommendations for Outpatient Follow-up:  1. Patient will need to avoid NSAIDs and has been instructed to take tylenol for her discomfort in the future. 2. CKD stage four, patient is to follow up with her nephrologist.  Discharge Diagnoses:  Principal Problem:   Overdose of salicylate Active Problems:   Drug overdose   Bipolar disorder   ARF (acute renal failure)   CKD (chronic kidney disease)   HTN (hypertension)   Uncontrolled hypertension   Tobacco use disorder   Discharge Condition: stable  Diet recommendation: Low sodium heart healthy  Filed Weights   11/12/12 0600 11/13/12 0500  Weight: 96.7 kg (213 lb 3 oz) 94.9 kg (209 lb 3.5 oz)    History of present illness:  52 y/o with history of depression, DM, Bipolar d/o who presented to the ED with AMS and was found to have an elevated salicylate level.  Hospital Course:  Overdose of salicylate , likely chronic  Her salicylate level was 23 on admission .  Patient has been counseled on avoiding NSAIDs due to CKD stage 4   acute on CKD (chronic kidney disease)  Nephrology on board and managed while patient was in house. Nephrology recommended the following: Assessment:  1 Stage 4 Chronic Kidney Disease possibly due to long term lithium treatment exacerbated by chronic NSAIDs or nephrosclerosis  Plan:  1 avoidance of NSAIDs long term discussed with pt again; will need alternative medication for headaches (she states " Tylenol does not work")  2 She will try to get name of her prior nephrologist for me.  3 We will arrange for outpt follow up after discharge. We will sign off.  Appointment at [facility] on August 22 at 9 AM   Metabolic acidosis  Bicarb level wnl, resolved. Repeat salicylate level normal  Most  likely due to # 1   Anemia  hb around 9-10 recently.  Iron panel reviewed and suspect anemia 2ary to Chronic Kidney disease   abdominal pain  [facility] on board and EGD done 11/14/12. [facility] recommended the following: I recommend treatment of her ulcer disease with proton pump inhibitors and avoidance of all NSAIDS and aspirin products.  If her abdominal pain continues or recurs after treatment for her ulcer disease would consider cholecystectomy. I would be happy to follow her up in the office at any point as needed for recurrent pain. We will sign off for now. Please call if needed.   Tobacco abuse   Counseled on cessation.   HTN (hypertension)  continue toprol  BP better after increasing hydralazine 50 mg q 6h  As such will d/c with script for hydralazine.  Bipolar disorder  Continue home meds  Psych consulted and patient to continue her routine outpatient follow ups.  Procedures:  EGD 11/14/12  Consultations:  Nephrology: Dr. Lowell Guitar  Psychiatry  [facility] Dr. Johna Sheriff  Discharge Exam: Filed Vitals:   11/14/12 1841 11/14/12 1859 11/14/12 2126 11/15/12 0600  BP:  136/90 156/99 115/73  Pulse:  88 86 79  Temp:  98.5 F (36.9 C) 98.6 F (37 C) 98.4 F (36.9 C)  TempSrc:  Oral Oral Oral  Resp: 17 18 20 18   Height:      Weight:      SpO2: 97% 98% 99% 96%    General: Pt in NAD, Alert and awake Cardiovascular: RRR,  no MRG Respiratory: CTA BL, no wheezes.  Discharge Instructions  Discharge Orders   Future Orders Complete By Expires     Call MD for:  persistant dizziness or light-headedness  As directed     Call MD for:  temperature >100.4  As directed     Diet - low sodium heart healthy  As directed     Discharge instructions  As directed     Comments:      Please be sure to follow up with the surgeon Dr. Johna Sheriff should you have any more abdominal discomfort.   Avoid any NSAIDs or aspirin.    Please follow up with your psychiatrist routinely or sooner should any new  concerns arise.    Increase activity slowly  As directed         Medication List    STOP taking these medications       furosemide 40 MG tablet  Commonly known as:  LASIX     ibuprofen 200 MG tablet  Commonly known as:  ADVIL,MOTRIN     zolpidem 10 MG tablet  Commonly known as:  AMBIEN      TAKE these medications       divalproex 500 MG DR tablet  Commonly known as:  DEPAKOTE  Take 1,000 mg by mouth at bedtime.     hydrALAZINE 50 MG tablet  Commonly known as:  APRESOLINE  Take 1 tablet (50 mg total) by mouth every 6 (six) hours.     metoprolol succinate 100 MG 24 hr tablet  Commonly known as:  TOPROL-XL  Take 100 mg by mouth daily. Take with or immediately following a meal.     mirtazapine 15 MG tablet  Commonly known as:  REMERON  Take 15 mg by mouth at bedtime. At 9pm     omeprazole 20 MG capsule  Commonly known as:  PRILOSEC  Take 20 mg by mouth daily.     simvastatin 20 MG tablet  Commonly known as:  ZOCOR  Take 20 mg by mouth every evening.     ziprasidone 60 MG capsule  Commonly known as:  GEODON  Take 120 mg by mouth at bedtime.       Allergies  Allergen Reactions  . Codeine Sulfate Anaphylaxis    Daughter called about having this allergy       The results of significant diagnostics from this hospitalization (including imaging, microbiology, ancillary and laboratory) are listed below for reference.    Significant Diagnostic Studies: Ct Abdomen Pelvis Wo Contrast  11/13/2012   *RADIOLOGY REPORT*  Clinical Data: Diffuse abdominal pain.  CT ABDOMEN AND PELVIS WITHOUT CONTRAST  Technique:  Multidetector CT imaging of the abdomen and pelvis was performed following the standard protocol without intravenous contrast.  Comparison: Ultrasound 11/13/2012  Findings: Lung bases are clear.  No pleural effusions.  Small pericardial effusion.  Heart is normal size.  Gallstones are noted within the gallbladder.  No CT evidence for acute cholecystitis.  Liver,  spleen, pancreas, adrenals and kidneys have an unremarkable unenhanced appearance.  No renal or ureteral stones.  No hydronephrosis.  Urinary bladder grossly unremarkable. Multiple calcified phleboliths in the anatomic pelvis.  Uterus and adnexa have an unremarkable unenhanced appearance.  Appendix is visualized and is normal.  Moderate stool burden throughout the colon.  Small bowel is decompressed.  Trace free fluid in the pelvis.  No free air or adenopathy.  Aorta is normal caliber.  Degenerative changes in the lower thoracic and lower lumbar spine.  No acute bony abnormality.  IMPRESSION: Cholelithiasis.  Moderate stool burden throughout the colon.  Small pericardial effusion.   Original Report Authenticated By: Charlett Nose, M.D.   Ct Head Wo Contrast  11/10/2012   *RADIOLOGY REPORT*  Clinical Data: Confusion.  Altered mental status.  History of acute renal failure, drug overdose, and bipolar disorder.  CT HEAD WITHOUT CONTRAST  Technique:  Contiguous axial images were obtained from the base of the skull through the vertex without contrast.  Comparison: 07/29/2012  Findings: The ventricles and sulci are symmetrical without significant effacement, displacement, or dilatation. No mass effect or midline shift. No abnormal extra-axial fluid collections. The grey-white matter junction is distinct. Basal cisterns are not effaced. No acute intracranial hemorrhage. No depressed skull fractures.  Visualized paranasal sinuses demonstrate retention cyst in the left maxillary antrum.  Mastoid air cells are not opacified. No significant change since previous study.  IMPRESSION: No acute intracranial abnormalities.   Original Report Authenticated By: Burman Nieves, M.D.   US Abdomen Complete  11/13/2012   *RADIOLOGY REPORT*  Clinical Data:  Diffuse abdominal pain.  History hypertension, acute renal failure on chronic renal disease.  COMPLETE ABDOMINAL ULTRASOUND  Comparison:  None.  Findings:  Gallbladder:  The  gallbladder is contracted and contains multiple stones.  Gallbladder wall is thickened, 3.2 mm.  A positive sonographic Murphy's sign is noted.  Common bile duct:  5.7 - 6.7 mm.  Liver:  No focal lesion identified.  Within normal limits in parenchymal echogenicity.  IVC:  Appears normal.  Pancreas:  No focal abnormality seen.  Spleen:  5.5 cm, normal in appearance.  Right Kidney:  12.0 cm in length.  No hydronephrosis.  Parenchyma is echogenic.  A simple cyst is 2.1 x 1.8 x 1.9 cm in the lower pole region.  Left Kidney:  10.7 cm in length.  Parenchyma is echogenic.  A cyst is 1.0 x 0.8 x 0.9 cm in mid pole region.  Abdominal aorta:  2.1 cm maximum diameter.  IMPRESSION:  1.  Contracted gallbladder containing numerous stones and thickened wall. 2.  Positive sonographic Murphy's sign.  The findings are consistent with acute cholecystitis. 3.  Echogenic renal parenchyma bilaterally, consistent with intrinsic renal disease. 4.  Small bilateral renal cysts. 5.  No hydronephrosis.   Original Report Authenticated By: Norva Pavlov, M.D.    Microbiology: Recent Results (from the past 240 hour(s))  MRSA PCR SCREENING     Status: None   Collection Time    11/10/12  5:57 PM      Result Value Range Status   MRSA by PCR NEGATIVE  NEGATIVE Final   Comment:            The GeneXpert MRSA Assay (FDA     approved for NASAL specimens     only), is one component of a     comprehensive MRSA colonization     surveillance program. It is not     intended to diagnose MRSA     infection nor to guide or     monitor treatment for     MRSA infections.     Labs: Basic Metabolic Panel:  Recent Labs Lab 11/10/12 1030 11/10/12 1630 11/11/12 0400 11/11/12 1045 11/12/12 0335 11/13/12 0341 11/14/12 0510  NA 136 137 136 138 137 141 142  K 4.3 3.6 3.8 3.9 3.7 4.3 4.4  CL 105 106 108 110 108 113* 114*  CO2 15* 19 18* 17* 22 22 22   GLUCOSE 81 79 117* 99  133* 87 92  BUN 57* 54* 46* 45* 35* 30* 30*  CREATININE 3.21*  3.22* 2.93* 2.86* 2.87* 3.12* 2.78*  CALCIUM 9.6 10.1 9.6 9.8 9.7 10.4 10.5  MG  --  2.8*  --   --   --   --   --   PHOS  --   --   --   --   --   --  3.5   Liver Function Tests:  Recent Labs Lab 11/10/12 1030 11/11/12 0400 11/14/12 0510  AST 28 16  --   ALT 13 10  --   ALKPHOS 52 44  --   BILITOT 0.1* 0.1*  --   PROT 7.4 6.2  --   ALBUMIN 3.3* 2.7* 2.3*    Recent Labs Lab 11/13/12 0341  LIPASE 64*   No results found for this basename: AMMONIA,  in the last 168 hours CBC:  Recent Labs Lab 11/10/12 1030 11/11/12 0400 11/12/12 0335  WBC 7.6 5.5 5.7  NEUTROABS 5.1  --   --   HGB 10.6* 9.4* 10.1*  HCT 32.6* 29.4* 32.0*  MCV 95.6 95.5 96.4  PLT 211 194 190   Cardiac Enzymes: No results found for this basename: CKTOTAL, CKMB, CKMBINDEX, TROPONINI,  in the last 168 hours BNP: BNP (last 3 results) No results found for this basename: PROBNP,  in the last 8760 hours CBG: No results found for this basename: GLUCAP,  in the last 168 hours     Signed:  Penny Pia  [facility] 11/15/2012, 11:54 AM

## 2012-11-15 NOTE — Progress Notes (Signed)
Patient discharged home in stable condition.  No change from morning assessment.  Script and instructions given to pt and daughter with verbal understanding and feedback.  All questions or concerns addressed.

## 2012-11-15 NOTE — Progress Notes (Signed)
Patient ID: Jennifer Lawson, female   DOB: 12-23-1960, 52 y.o.   MRN: [id number] 1 Day Post-Op  Subjective: She denies any abdominal pain this morning. Chief complaint is headache.  Objective: Vital signs in last 24 hours: Temp:  [98 F (36.7 C)-98.6 F (37 C)] 98.4 F (36.9 C) (07/10 0600) Pulse Rate:  [74-93] 79 (07/10 0600) Resp:  [13-20] 18 (07/10 0600) BP: (115-165)/(73-104) 115/73 mmHg (07/10 0600) SpO2:  [96 %-100 %] 96 % (07/10 0600)    Intake/Output from previous day: 07/09 0701 - 07/10 0700 In: 900 [I.V.:900] Out: -  Intake/Output this shift:    General appearance: alert, cooperative and no distress GI: normal findings: soft, non-tender  Lab Results:  No results found for this basename: WBC, HGB, HCT, PLT,  in the last 72 hours BMET  Recent Labs  11/13/12 0341 11/14/12 0510  NA 141 142  K 4.3 4.4  CL 113* 114*  CO2 22 22  GLUCOSE 87 92  BUN 30* 30*  CREATININE 3.12* 2.78*  CALCIUM 10.4 10.5     Studies/Results: Ct Abdomen Pelvis Wo Contrast  11/13/2012   *RADIOLOGY REPORT*  Clinical Data: Diffuse abdominal pain.  CT ABDOMEN AND PELVIS WITHOUT CONTRAST  Technique:  Multidetector CT imaging of the abdomen and pelvis was performed following the standard protocol without intravenous contrast.  Comparison: Ultrasound 11/13/2012  Findings: Lung bases are clear.  No pleural effusions.  Small pericardial effusion.  Heart is normal size.  Gallstones are noted within the gallbladder.  No CT evidence for acute cholecystitis.  Liver, spleen, pancreas, adrenals and kidneys have an unremarkable unenhanced appearance.  No renal or ureteral stones.  No hydronephrosis.  Urinary bladder grossly unremarkable. Multiple calcified phleboliths in the anatomic pelvis.  Uterus and adnexa have an unremarkable unenhanced appearance.  Appendix is visualized and is normal.  Moderate stool burden throughout the colon.  Small bowel is decompressed.  Trace free fluid in the pelvis.  No free  air or adenopathy.  Aorta is normal caliber.  Degenerative changes in the lower thoracic and lower lumbar spine. No acute bony abnormality.  IMPRESSION: Cholelithiasis.  Moderate stool burden throughout the colon.  Small pericardial effusion.   Original Report Authenticated By: Charlett Nose, M.D.    Anti-infectives: Anti-infectives   None      Assessment/Plan: s/p Procedure(s): ESOPHAGOGASTRODUODENOSCOPY (EGD) Abdominal pain poorly characterized. This has improved significantly over the last several days. Endoscopy shows significant antral erosions and a small ulcer. This could certainly be the source of her pain. She has gallstones but I am not at all sure they are symptomatic. I recommend treatment of her ulcer disease with proton pump inhibitors and avoidance of all NSAIDS and aspirin products. If her abdominal pain continues or recurs after treatment for her ulcer disease would consider cholecystectomy. I would be happy to follow her up in the office at any point as needed for recurrent pain. We will sign off for now. Please call if needed.   LOS: 5 days    Ziquan Fidel T 11/15/2012

## 2012-11-16 LAB — PROTEIN ELECTROPHORESIS, SERUM
Albumin ELP: 48.5 % — ABNORMAL LOW (ref 55.8–66.1)
Alpha-1-Globulin: 5.7 % — ABNORMAL HIGH (ref 2.9–4.9)
Alpha-2-Globulin: 12.3 % — ABNORMAL HIGH (ref 7.1–11.8)
Beta 2: 6.5 % (ref 3.2–6.5)
Beta Globulin: 6.3 % (ref 4.7–7.2)
Gamma Globulin: 20.7 % — ABNORMAL HIGH (ref 11.1–18.8)
M-Spike, %: NOT DETECTED g/dL
Total Protein ELP: 5.2 g/dL — ABNORMAL LOW (ref 6.0–8.3)

## 2012-11-17 ENCOUNTER — Emergency Department (HOSPITAL_COMMUNITY)
Admission: EM | Admit: 2012-11-17 | Discharge: 2012-11-17 | Disposition: A | Discharge status: Home / Self Care | Payer: Medicare Other | Attending: Emergency Medicine | Admitting: Emergency Medicine

## 2012-11-17 ENCOUNTER — Other Ambulatory Visit: Payer: Self-pay

## 2012-11-17 ENCOUNTER — Encounter (HOSPITAL_COMMUNITY): Payer: Self-pay | Admitting: Emergency Medicine

## 2012-11-17 ENCOUNTER — Emergency Department (HOSPITAL_COMMUNITY): Payer: Medicare Other

## 2012-11-17 DIAGNOSIS — R51 Headache: Secondary | ICD-10-CM | POA: Insufficient documentation

## 2012-11-17 DIAGNOSIS — F329 Major depressive disorder, single episode, unspecified: Secondary | ICD-10-CM | POA: Insufficient documentation

## 2012-11-17 DIAGNOSIS — F17200 Nicotine dependence, unspecified, uncomplicated: Secondary | ICD-10-CM | POA: Insufficient documentation

## 2012-11-17 DIAGNOSIS — R109 Unspecified abdominal pain: Secondary | ICD-10-CM | POA: Diagnosis not present

## 2012-11-17 DIAGNOSIS — Z8709 Personal history of other diseases of the respiratory system: Secondary | ICD-10-CM | POA: Insufficient documentation

## 2012-11-17 DIAGNOSIS — R079 Chest pain, unspecified: Secondary | ICD-10-CM | POA: Diagnosis not present

## 2012-11-17 DIAGNOSIS — R0789 Other chest pain: Secondary | ICD-10-CM | POA: Diagnosis not present

## 2012-11-17 DIAGNOSIS — N189 Chronic kidney disease, unspecified: Secondary | ICD-10-CM | POA: Insufficient documentation

## 2012-11-17 DIAGNOSIS — E119 Type 2 diabetes mellitus without complications: Secondary | ICD-10-CM | POA: Diagnosis not present

## 2012-11-17 DIAGNOSIS — F3289 Other specified depressive episodes: Secondary | ICD-10-CM | POA: Insufficient documentation

## 2012-11-17 DIAGNOSIS — R519 Headache, unspecified: Secondary | ICD-10-CM

## 2012-11-17 DIAGNOSIS — Z79899 Other long term (current) drug therapy: Secondary | ICD-10-CM | POA: Insufficient documentation

## 2012-11-17 DIAGNOSIS — I129 Hypertensive chronic kidney disease with stage 1 through stage 4 chronic kidney disease, or unspecified chronic kidney disease: Secondary | ICD-10-CM | POA: Insufficient documentation

## 2012-11-17 DIAGNOSIS — R0989 Other specified symptoms and signs involving the circulatory and respiratory systems: Secondary | ICD-10-CM | POA: Diagnosis not present

## 2012-11-17 DIAGNOSIS — R0602 Shortness of breath: Secondary | ICD-10-CM | POA: Diagnosis not present

## 2012-11-17 LAB — COMPREHENSIVE METABOLIC PANEL
ALT: 13 U/L (ref 0–35)
AST: 17 U/L (ref 0–37)
Albumin: 2.7 g/dL — ABNORMAL LOW (ref 3.5–5.2)
Alkaline Phosphatase: 40 U/L (ref 39–117)
BUN: 30 mg/dL — ABNORMAL HIGH (ref 6–23)
CO2: 22 mEq/L (ref 19–32)
Calcium: 10.5 mg/dL (ref 8.4–10.5)
Chloride: 112 mEq/L (ref 96–112)
Creatinine, Ser: 2.72 mg/dL — ABNORMAL HIGH (ref 0.50–1.10)
GFR calc Af Amer: 22 mL/min — ABNORMAL LOW (ref >90)
GFR calc non Af Amer: 19 mL/min — ABNORMAL LOW (ref >90)
Glucose, Bld: 87 mg/dL (ref 70–99)
Potassium: 5.2 mEq/L — ABNORMAL HIGH (ref 3.5–5.1)
Sodium: 141 mEq/L (ref 135–145)
Total Bilirubin: 0.1 mg/dL — ABNORMAL LOW (ref 0.3–1.2)
Total Protein: 6.5 g/dL (ref 6.0–8.3)

## 2012-11-17 LAB — CBC
HCT: 31.8 % — ABNORMAL LOW (ref 36.0–46.0)
Hemoglobin: 10 g/dL — ABNORMAL LOW (ref 12.0–15.0)
MCH: 31.1 pg (ref 26.0–34.0)
MCHC: 31.4 g/dL (ref 30.0–36.0)
MCV: 98.8 fL (ref 78.0–100.0)
Platelets: 143 10*3/uL — ABNORMAL LOW (ref 150–400)
RBC: 3.22 MIL/uL — ABNORMAL LOW (ref 3.87–5.11)
RDW: 17.5 % — ABNORMAL HIGH (ref 11.5–15.5)
WBC: 6.1 10*3/uL (ref 4.0–10.5)

## 2012-11-17 LAB — TROPONIN I: Troponin I: <0.3 ng/mL (ref <0.30)

## 2012-11-17 LAB — VALPROIC ACID LEVEL: Valproic Acid Lvl: <10 ug/mL — ABNORMAL LOW (ref 50.0–100.0)

## 2012-11-17 LAB — PRO B NATRIURETIC PEPTIDE: Pro B Natriuretic peptide (BNP): 643.4 pg/mL — ABNORMAL HIGH (ref 0–125)

## 2012-11-17 LAB — LIPASE, BLOOD: Lipase: 44 U/L (ref 11–59)

## 2012-11-17 MED ORDER — HYDROMORPHONE HCL PF 1 MG/ML IJ SOLN
1.0000 mg | Freq: Once | INTRAMUSCULAR | Status: AC
  Administered 2012-11-17: 1 mg via INTRAVENOUS
  Filled 2012-11-17: qty 1

## 2012-11-17 MED ORDER — SODIUM CHLORIDE 0.9 % IV SOLN
INTRAVENOUS | Status: DC
  Administered 2012-11-17: 10:00:00 via INTRAVENOUS

## 2012-11-17 MED ORDER — ONDANSETRON HCL 4 MG/2ML IJ SOLN
4.0000 mg | Freq: Once | INTRAMUSCULAR | Status: AC
  Administered 2012-11-17: 4 mg via INTRAVENOUS
  Filled 2012-11-17: qty 2

## 2012-11-17 MED ORDER — PANTOPRAZOLE SODIUM 40 MG IV SOLR
40.0000 mg | Freq: Once | INTRAVENOUS | Status: AC
  Administered 2012-11-17: 40 mg via INTRAVENOUS
  Filled 2012-11-17: qty 40

## 2012-11-17 MED ORDER — LORAZEPAM 1 MG PO TABS
1.0000 mg | ORAL_TABLET | Freq: Once | ORAL | Status: AC
  Administered 2012-11-17: 1 mg via ORAL
  Filled 2012-11-17: qty 1

## 2012-11-17 MED ORDER — SODIUM CHLORIDE 0.9 % IV BOLUS (SEPSIS)
500.0000 mL | Freq: Once | INTRAVENOUS | Status: AC
  Administered 2012-11-17: 500 mL via INTRAVENOUS

## 2012-11-17 NOTE — ED Notes (Signed)
Patient transported to X-ray 

## 2012-11-17 NOTE — ED Notes (Signed)
Hydromorphone accidentally wasted in the floor

## 2012-11-17 NOTE — ED Notes (Signed)
Pt was d/c from [facility] inpatient on 5E on 10th of this month for "stomach problems and gallstones".  Pt c/o headache since she was admitted into the hospital and chest pain that radiates to her back and makes her SOB.  Denies heart problems.

## 2012-11-17 NOTE — ED Notes (Signed)
Chest pain started last night 5pm also c/o migraine stating that she did not sleep all night long. Decided to come to ED because of difficulty breathing, chest pain and stomach pain. Just released from hospital on 7/10 for same thing.  Patient states that gall stones were found. Currently patient states that she is not having any pain and that she "doesn't understand this pain.

## 2012-11-17 NOTE — ED Notes (Signed)
Patient complaining of Chest pain 8/10.

## 2012-11-17 NOTE — ED Notes (Signed)
MD Denton Lank made aware

## 2012-11-17 NOTE — ED Provider Notes (Signed)
History    CSN: [id number] Arrival date & time 11/17/12  4098  First MD Initiated Contact with Patient 11/17/12 0901     Chief Complaint  Patient presents with  . Chest Pain  . Headache  . Shortness of Breath   (Consider location/radiation/quality/duration/timing/severity/associated sxs/prior Treatment) The history is provided by the patient.  pt c/o abd pain, headache, cp and sob for the past several days. States she was having all of these same symptoms during recent hospital admission, being discharge to home a week ago. No acute or abrupt worsening of symptoms today, but states they have been constant since. Dull frontal headache, gradual onset, moderate. Feels similar to prior headaches. No neck pain or stiffness. No sinus drainage or pressure. No eye pain or change in vision. No numbness/weakness. No recent head injury or loc. Cp constant, dull, mid chest, pressure, non radiating. No pleuritic pain. No leg pain or swelling.  Cp is at rest, no change w activity or exertion. No positional change. Denies hx cad or chf, denies family hx cad. No drug/cocaine use. Has felt mildly sob, constant. Denies cough. No orthopnea or pnd. +smoker. States compliant w normal meds. abd pain mid abdomen, dull, cramping. Pt denies specific exacerbating or alleviating factors. Normal appetite. No nvd. No constipation. No dysuria or gu c/o. No back or flank pain.  Pt states has taken nothing for pain today. Since hospital d/c, denies any asa or nsaid use.    Past Medical History  Diagnosis Date  . Mental disorder   . Depression   . Hypertension   . Diabetes mellitus without complication   . Overdose   . Tobacco use disorder 11/13/2012   Past Surgical History  Procedure Laterality Date  . Right knee replacement      she says it was last year.  . Esophagogastroduodenoscopy Left 11/14/2012    Procedure: ESOPHAGOGASTRODUODENOSCOPY (EGD);  Surgeon: Charna Elizabeth, MD;  Location: [facility];  Service:  Endoscopy;  Laterality: Left;   No family history on file. History  Substance Use Topics  . Smoking status: Current Every Day Smoker -- 0.50 packs/day  . Smokeless tobacco: Never Used  . Alcohol Use: Yes   OB History   Grav Para Term Preterm Abortions TAB SAB Ect Mult Living                 Review of Systems  Constitutional: Negative for fever and chills.  HENT: Negative for congestion, rhinorrhea, neck pain and neck stiffness.   Eyes: Negative for redness.  Respiratory: Negative for cough.   Cardiovascular: Negative for leg swelling.  Gastrointestinal: Negative for vomiting, diarrhea and constipation.  Genitourinary: Negative for dysuria and flank pain.  Musculoskeletal: Negative for back pain.  Skin: Negative for rash.  Neurological: Negative for weakness, numbness and headaches.  Hematological: Does not bruise/bleed easily.  Psychiatric/Behavioral: Negative for confusion.    Allergies  Codeine sulfate  Home Medications   Current Outpatient Rx  Name  Route  Sig  Dispense  Refill  . divalproex (DEPAKOTE) 500 MG DR tablet   Oral   Take 1,000 mg by mouth at bedtime.          . hydrALAZINE (APRESOLINE) 50 MG tablet   Oral   Take 1 tablet (50 mg total) by mouth every 6 (six) hours.   40 tablet   0   . metoprolol succinate (TOPROL-XL) 100 MG 24 hr tablet   Oral   Take 100 mg by mouth daily. Take with or  immediately following a meal.         . mirtazapine (REMERON) 15 MG tablet   Oral   Take 15 mg by mouth at bedtime. At 9pm         . omeprazole (PRILOSEC) 20 MG capsule   Oral   Take 20 mg by mouth daily.         . simvastatin (ZOCOR) 20 MG tablet   Oral   Take 20 mg by mouth every evening.         . ziprasidone (GEODON) 60 MG capsule   Oral   Take 120 mg by mouth at bedtime.          There were no vitals taken for this visit. Physical Exam  Nursing note and vitals reviewed. Constitutional: She is oriented to person, place, and time. She  appears well-developed and well-nourished. No distress.  HENT:  Head: Atraumatic.  Nose: Nose normal.  Mouth/Throat: Oropharynx is clear and moist.  No sinus or temporal tenderness.  Eyes: Conjunctivae and EOM are normal. Pupils are equal, round, and reactive to light. No scleral icterus.  Neck: Neck supple. No tracheal deviation present. No thyromegaly present.  No stiffness or rigidity.   Cardiovascular: Normal rate, regular rhythm, normal heart sounds and intact distal pulses.  Exam reveals no gallop and no friction rub.   No murmur heard. Pulmonary/Chest: Effort normal and breath sounds normal. No respiratory distress. She exhibits no tenderness.  Abdominal: Soft. Normal appearance and bowel sounds are normal. She exhibits no distension and no mass. There is no tenderness. There is no rebound and no guarding.  Genitourinary:  No cva tenderness.  Musculoskeletal: Normal range of motion. She exhibits no edema and no tenderness.  Neurological: She is alert and oriented to person, place, and time. No cranial nerve deficit.  Motor intact bilaterally. Steady gait.   Skin: Skin is warm and dry. No rash noted. She is not diaphoretic.  Psychiatric:  Anxious appearing.     ED Course  Procedures (including critical care time)  Results for orders placed during the hospital encounter of 11/17/12  PRO B NATRIURETIC PEPTIDE      Result Value Range   Pro B Natriuretic peptide (BNP) 643.4 (*) 0 - 125 pg/mL  CBC      Result Value Range   WBC 6.1  4.0 - 10.5 K/uL   RBC 3.22 (*) 3.87 - 5.11 MIL/uL   Hemoglobin 10.0 (*) 12.0 - 15.0 g/dL   HCT 40.9 (*) 81.1 - 91.4 %   MCV 98.8  78.0 - 100.0 fL   MCH 31.1  26.0 - 34.0 pg   MCHC 31.4  30.0 - 36.0 g/dL   RDW 78.2 (*) 95.6 - 21.3 %   Platelets 143 (*) 150 - 400 K/uL  COMPREHENSIVE METABOLIC PANEL      Result Value Range   Sodium 141  135 - 145 mEq/L   Potassium 5.2 (*) 3.5 - 5.1 mEq/L   Chloride 112  96 - 112 mEq/L   CO2 22  19 - 32 mEq/L    Glucose, Bld 87  70 - 99 mg/dL   BUN 30 (*) 6 - 23 mg/dL   Creatinine, Ser 0.86 (*) 0.50 - 1.10 mg/dL   Calcium 57.8  8.4 - 46.9 mg/dL   Total Protein 6.5  6.0 - 8.3 g/dL   Albumin 2.7 (*) 3.5 - 5.2 g/dL   AST 17  0 - 37 U/L   ALT 13  0 -  35 U/L   Alkaline Phosphatase 40  39 - 117 U/L   Total Bilirubin 0.1 (*) 0.3 - 1.2 mg/dL   GFR calc non Af Amer 19 (*) >90 mL/min   GFR calc Af Amer 22 (*) >90 mL/min  TROPONIN I      Result Value Range   Troponin I <0.30  <0.30 ng/mL  VALPROIC ACID LEVEL      Result Value Range   Valproic Acid Lvl <10.0 (*) 50.0 - 100.0 ug/mL  LIPASE, BLOOD      Result Value Range   Lipase 44  11 - 59 U/L   Ct Abdomen Pelvis Wo Contrast  11/13/2012   *RADIOLOGY REPORT*  Clinical Data: Diffuse abdominal pain.  CT ABDOMEN AND PELVIS WITHOUT CONTRAST  Technique:  Multidetector CT imaging of the abdomen and pelvis was performed following the standard protocol without intravenous contrast.  Comparison: Ultrasound 11/13/2012  Findings: Lung bases are clear.  No pleural effusions.  Small pericardial effusion.  Heart is normal size.  Gallstones are noted within the gallbladder.  No CT evidence for acute cholecystitis.  Liver, spleen, pancreas, adrenals and kidneys have an unremarkable unenhanced appearance.  No renal or ureteral stones.  No hydronephrosis.  Urinary bladder grossly unremarkable. Multiple calcified phleboliths in the anatomic pelvis.  Uterus and adnexa have an unremarkable unenhanced appearance.  Appendix is visualized and is normal.  Moderate stool burden throughout the colon.  Small bowel is decompressed.  Trace free fluid in the pelvis.  No free air or adenopathy.  Aorta is normal caliber.  Degenerative changes in the lower thoracic and lower lumbar spine. No acute bony abnormality.  IMPRESSION: Cholelithiasis.  Moderate stool burden throughout the colon.  Small pericardial effusion.   Original Report Authenticated By: Charlett Nose, M.D.   Dg Chest 2  View  11/17/2012   *RADIOLOGY REPORT*  Clinical Data: Upper abdominal pain.  CHEST - 2 VIEW  Comparison: None.  Findings: The heart is mildly enlarged.  There is no edema or effusion to suggest failure.  Degenerative changes are noted in the thoracic spine.  IMPRESSION:  1.  Mild cardiomegaly without failure. 2.  The lungs are clear.   Original Report Authenticated By: Marin Roberts, M.D.   Ct Head Wo Contrast  11/10/2012   *RADIOLOGY REPORT*  Clinical Data: Confusion.  Altered mental status.  History of acute renal failure, drug overdose, and bipolar disorder.  CT HEAD WITHOUT CONTRAST  Technique:  Contiguous axial images were obtained from the base of the skull through the vertex without contrast.  Comparison: 07/29/2012  Findings: The ventricles and sulci are symmetrical without significant effacement, displacement, or dilatation. No mass effect or midline shift. No abnormal extra-axial fluid collections. The grey-white matter junction is distinct. Basal cisterns are not effaced. No acute intracranial hemorrhage. No depressed skull fractures.  Visualized paranasal sinuses demonstrate retention cyst in the left maxillary antrum.  Mastoid air cells are not opacified. No significant change since previous study.  IMPRESSION: No acute intracranial abnormalities.   Original Report Authenticated By: Burman Nieves, M.D.      MDM  Iv ns. Labs. Cxr. Ecg.  Reviewed nursing notes and prior charts for additional history.    Date: 11/17/2012  Rate: 85  Rhythm: normal sinus rhythm  QRS Axis: normal  Intervals: normal  ST/T Wave abnormalities: normal  Conduction Disutrbances:none  Narrative Interpretation:   Old EKG Reviewed: unchanged  Pt anxious, notes multiple c/o, cp, abd pain, headache, feels anxious. Ativan 1 mg po.  Pt request pain med  - dilaudid 1 mg iv.  Ivf.  Recheck pt tolerating po.  abd soft nt.    No current c/o abd pain or nv.   Reviewed recent notes, for eval same  symptoms, ct abd, u/s abd, egd and gen surgery eval within past week - will refer to f/u w gen surg closely as outpt regarding gallstones. After constant symptoms/pain for days, ecg c/w baseline and trop neg, feel not c/w acs.  Pt also w recent head ct, neg for acute process.  On recheck pt calm, alert, denies pain, feels much improved, labs c/w baseline.  Pt appears stable for d/c.         Suzi Roots, MD 11/17/12 1057

## 2012-11-20 DIAGNOSIS — F3132 Bipolar disorder, current episode depressed, moderate: Secondary | ICD-10-CM | POA: Diagnosis not present

## 2012-11-20 DIAGNOSIS — F29 Unspecified psychosis not due to a substance or known physiological condition: Secondary | ICD-10-CM | POA: Diagnosis not present

## 2012-11-20 DIAGNOSIS — F4310 Post-traumatic stress disorder, unspecified: Secondary | ICD-10-CM | POA: Diagnosis not present

## 2012-11-30 DIAGNOSIS — I739 Peripheral vascular disease, unspecified: Secondary | ICD-10-CM | POA: Diagnosis not present

## 2012-11-30 DIAGNOSIS — E538 Deficiency of other specified B group vitamins: Secondary | ICD-10-CM | POA: Diagnosis not present

## 2012-11-30 DIAGNOSIS — I509 Heart failure, unspecified: Secondary | ICD-10-CM | POA: Diagnosis not present

## 2012-11-30 DIAGNOSIS — I10 Essential (primary) hypertension: Secondary | ICD-10-CM | POA: Diagnosis not present

## 2012-11-30 DIAGNOSIS — F17200 Nicotine dependence, unspecified, uncomplicated: Secondary | ICD-10-CM | POA: Diagnosis not present

## 2012-11-30 DIAGNOSIS — F319 Bipolar disorder, unspecified: Secondary | ICD-10-CM | POA: Diagnosis not present

## 2012-11-30 DIAGNOSIS — E785 Hyperlipidemia, unspecified: Secondary | ICD-10-CM | POA: Diagnosis not present

## 2012-11-30 DIAGNOSIS — R0609 Other forms of dyspnea: Secondary | ICD-10-CM | POA: Diagnosis not present

## 2012-11-30 DIAGNOSIS — N183 Chronic kidney disease, stage 3 unspecified: Secondary | ICD-10-CM | POA: Diagnosis not present

## 2012-11-30 DIAGNOSIS — R109 Unspecified abdominal pain: Secondary | ICD-10-CM | POA: Diagnosis not present

## 2012-11-30 DIAGNOSIS — R0989 Other specified symptoms and signs involving the circulatory and respiratory systems: Secondary | ICD-10-CM | POA: Diagnosis not present

## 2012-12-11 DIAGNOSIS — F17200 Nicotine dependence, unspecified, uncomplicated: Secondary | ICD-10-CM | POA: Diagnosis not present

## 2012-12-11 DIAGNOSIS — E538 Deficiency of other specified B group vitamins: Secondary | ICD-10-CM | POA: Diagnosis not present

## 2012-12-11 DIAGNOSIS — G479 Sleep disorder, unspecified: Secondary | ICD-10-CM | POA: Diagnosis not present

## 2012-12-11 DIAGNOSIS — F319 Bipolar disorder, unspecified: Secondary | ICD-10-CM | POA: Diagnosis not present

## 2012-12-11 DIAGNOSIS — I509 Heart failure, unspecified: Secondary | ICD-10-CM | POA: Diagnosis not present

## 2012-12-11 DIAGNOSIS — N183 Chronic kidney disease, stage 3 unspecified: Secondary | ICD-10-CM | POA: Diagnosis not present

## 2012-12-11 DIAGNOSIS — I10 Essential (primary) hypertension: Secondary | ICD-10-CM | POA: Diagnosis not present

## 2012-12-11 DIAGNOSIS — E785 Hyperlipidemia, unspecified: Secondary | ICD-10-CM | POA: Diagnosis not present

## 2012-12-17 DIAGNOSIS — I10 Essential (primary) hypertension: Secondary | ICD-10-CM | POA: Diagnosis not present

## 2012-12-17 DIAGNOSIS — I5032 Chronic diastolic (congestive) heart failure: Secondary | ICD-10-CM | POA: Diagnosis not present

## 2012-12-17 DIAGNOSIS — N184 Chronic kidney disease, stage 4 (severe): Secondary | ICD-10-CM | POA: Diagnosis not present

## 2012-12-17 DIAGNOSIS — I509 Heart failure, unspecified: Secondary | ICD-10-CM | POA: Diagnosis not present

## 2012-12-25 DIAGNOSIS — G4733 Obstructive sleep apnea (adult) (pediatric): Secondary | ICD-10-CM | POA: Diagnosis not present

## 2012-12-25 DIAGNOSIS — G479 Sleep disorder, unspecified: Secondary | ICD-10-CM | POA: Diagnosis not present

## 2012-12-27 DIAGNOSIS — I5032 Chronic diastolic (congestive) heart failure: Secondary | ICD-10-CM | POA: Diagnosis not present

## 2012-12-27 DIAGNOSIS — R0602 Shortness of breath: Secondary | ICD-10-CM | POA: Diagnosis not present

## 2013-01-11 DIAGNOSIS — F29 Unspecified psychosis not due to a substance or known physiological condition: Secondary | ICD-10-CM | POA: Diagnosis not present

## 2013-01-11 DIAGNOSIS — F4310 Post-traumatic stress disorder, unspecified: Secondary | ICD-10-CM | POA: Diagnosis not present

## 2013-01-11 DIAGNOSIS — F3132 Bipolar disorder, current episode depressed, moderate: Secondary | ICD-10-CM | POA: Diagnosis not present

## 2013-01-16 ENCOUNTER — Encounter (HOSPITAL_COMMUNITY): Payer: Self-pay | Admitting: Family Medicine

## 2013-01-16 ENCOUNTER — Inpatient Hospital Stay (HOSPITAL_COMMUNITY)
Admission: EM | Admit: 2013-01-16 | Discharge: 2013-01-22 | DRG: 644 | Disposition: A | Discharge status: Home Health | Payer: Medicare Other | Attending: Family Medicine | Admitting: Family Medicine

## 2013-01-16 DIAGNOSIS — K8010 Calculus of gallbladder with chronic cholecystitis without obstruction: Secondary | ICD-10-CM | POA: Diagnosis present

## 2013-01-16 DIAGNOSIS — I129 Hypertensive chronic kidney disease with stage 1 through stage 4 chronic kidney disease, or unspecified chronic kidney disease: Secondary | ICD-10-CM | POA: Diagnosis present

## 2013-01-16 DIAGNOSIS — F17200 Nicotine dependence, unspecified, uncomplicated: Secondary | ICD-10-CM | POA: Diagnosis not present

## 2013-01-16 DIAGNOSIS — N189 Chronic kidney disease, unspecified: Secondary | ICD-10-CM

## 2013-01-16 DIAGNOSIS — E213 Hyperparathyroidism, unspecified: Secondary | ICD-10-CM | POA: Diagnosis present

## 2013-01-16 DIAGNOSIS — K8020 Calculus of gallbladder without cholecystitis without obstruction: Secondary | ICD-10-CM | POA: Diagnosis present

## 2013-01-16 DIAGNOSIS — R0602 Shortness of breath: Secondary | ICD-10-CM | POA: Diagnosis not present

## 2013-01-16 DIAGNOSIS — N179 Acute kidney failure, unspecified: Secondary | ICD-10-CM | POA: Diagnosis not present

## 2013-01-16 DIAGNOSIS — R400 Somnolence: Secondary | ICD-10-CM

## 2013-01-16 DIAGNOSIS — Z96659 Presence of unspecified artificial knee joint: Secondary | ICD-10-CM | POA: Diagnosis not present

## 2013-01-16 DIAGNOSIS — Z79899 Other long term (current) drug therapy: Secondary | ICD-10-CM

## 2013-01-16 DIAGNOSIS — N183 Chronic kidney disease, stage 3 unspecified: Secondary | ICD-10-CM | POA: Diagnosis not present

## 2013-01-16 DIAGNOSIS — N184 Chronic kidney disease, stage 4 (severe): Secondary | ICD-10-CM | POA: Diagnosis present

## 2013-01-16 DIAGNOSIS — T438X5A Adverse effect of other psychotropic drugs, initial encounter: Secondary | ICD-10-CM | POA: Diagnosis present

## 2013-01-16 DIAGNOSIS — E538 Deficiency of other specified B group vitamins: Secondary | ICD-10-CM | POA: Diagnosis not present

## 2013-01-16 DIAGNOSIS — I509 Heart failure, unspecified: Secondary | ICD-10-CM | POA: Diagnosis not present

## 2013-01-16 DIAGNOSIS — R4182 Altered mental status, unspecified: Secondary | ICD-10-CM

## 2013-01-16 DIAGNOSIS — J9819 Other pulmonary collapse: Secondary | ICD-10-CM | POA: Diagnosis not present

## 2013-01-16 DIAGNOSIS — E870 Hyperosmolality and hypernatremia: Secondary | ICD-10-CM | POA: Diagnosis not present

## 2013-01-16 DIAGNOSIS — R42 Dizziness and giddiness: Secondary | ICD-10-CM | POA: Diagnosis not present

## 2013-01-16 DIAGNOSIS — E119 Type 2 diabetes mellitus without complications: Secondary | ICD-10-CM | POA: Diagnosis present

## 2013-01-16 DIAGNOSIS — E860 Dehydration: Secondary | ICD-10-CM

## 2013-01-16 DIAGNOSIS — F319 Bipolar disorder, unspecified: Secondary | ICD-10-CM | POA: Diagnosis present

## 2013-01-16 DIAGNOSIS — K2970 Gastritis, unspecified, without bleeding: Secondary | ICD-10-CM | POA: Diagnosis not present

## 2013-01-16 DIAGNOSIS — R404 Transient alteration of awareness: Secondary | ICD-10-CM | POA: Diagnosis not present

## 2013-01-16 DIAGNOSIS — E785 Hyperlipidemia, unspecified: Secondary | ICD-10-CM | POA: Diagnosis not present

## 2013-01-16 DIAGNOSIS — I10 Essential (primary) hypertension: Secondary | ICD-10-CM | POA: Diagnosis not present

## 2013-01-16 DIAGNOSIS — R000 Tachycardia, unspecified: Secondary | ICD-10-CM

## 2013-01-16 DIAGNOSIS — R112 Nausea with vomiting, unspecified: Secondary | ICD-10-CM | POA: Diagnosis not present

## 2013-01-16 LAB — GLUCOSE, CAPILLARY: Glucose-Capillary: 92 mg/dL (ref 70–99)

## 2013-01-16 NOTE — ED Notes (Addendum)
Patient states she has a cough and shortness of breath. Was seen by PCP today and was told her ears had fluid. States she has had shortness of breath for "a couple of days." States she was started on Clonidine for sleep and has been falling since she started it. States she took Depakote x 2, Geodon x 2, and cholesterol pill.

## 2013-01-17 ENCOUNTER — Inpatient Hospital Stay (HOSPITAL_COMMUNITY): Payer: Medicare Other

## 2013-01-17 ENCOUNTER — Encounter (HOSPITAL_COMMUNITY): Payer: Self-pay

## 2013-01-17 ENCOUNTER — Emergency Department (HOSPITAL_COMMUNITY): Payer: Medicare Other

## 2013-01-17 DIAGNOSIS — I10 Essential (primary) hypertension: Secondary | ICD-10-CM

## 2013-01-17 DIAGNOSIS — R4182 Altered mental status, unspecified: Secondary | ICD-10-CM

## 2013-01-17 DIAGNOSIS — R404 Transient alteration of awareness: Secondary | ICD-10-CM

## 2013-01-17 DIAGNOSIS — N189 Chronic kidney disease, unspecified: Secondary | ICD-10-CM

## 2013-01-17 DIAGNOSIS — F319 Bipolar disorder, unspecified: Secondary | ICD-10-CM

## 2013-01-17 DIAGNOSIS — R0602 Shortness of breath: Secondary | ICD-10-CM

## 2013-01-17 DIAGNOSIS — N179 Acute kidney failure, unspecified: Secondary | ICD-10-CM

## 2013-01-17 LAB — BASIC METABOLIC PANEL
BUN: 36 mg/dL — ABNORMAL HIGH (ref 6–23)
BUN: 39 mg/dL — ABNORMAL HIGH (ref 6–23)
CO2: 23 mEq/L (ref 19–32)
CO2: 24 mEq/L (ref 19–32)
Calcium: 10.7 mg/dL — ABNORMAL HIGH (ref 8.4–10.5)
Calcium: 11 mg/dL — ABNORMAL HIGH (ref 8.4–10.5)
Chloride: 107 mEq/L (ref 96–112)
Chloride: 112 mEq/L (ref 96–112)
Creatinine, Ser: 3.34 mg/dL — ABNORMAL HIGH (ref 0.50–1.10)
Creatinine, Ser: 3.38 mg/dL — ABNORMAL HIGH (ref 0.50–1.10)
GFR calc Af Amer: 17 mL/min — ABNORMAL LOW (ref >90)
GFR calc Af Amer: 17 mL/min — ABNORMAL LOW (ref >90)
GFR calc non Af Amer: 14 mL/min — ABNORMAL LOW (ref >90)
GFR calc non Af Amer: 15 mL/min — ABNORMAL LOW (ref >90)
Glucose, Bld: 87 mg/dL (ref 70–99)
Glucose, Bld: 89 mg/dL (ref 70–99)
Potassium: 4.5 mEq/L (ref 3.5–5.1)
Potassium: 4.7 mEq/L (ref 3.5–5.1)
Sodium: 139 mEq/L (ref 135–145)
Sodium: 144 mEq/L (ref 135–145)

## 2013-01-17 LAB — CBC
HCT: 34.2 % — ABNORMAL LOW (ref 36.0–46.0)
HCT: 35.6 % — ABNORMAL LOW (ref 36.0–46.0)
Hemoglobin: 10.9 g/dL — ABNORMAL LOW (ref 12.0–15.0)
Hemoglobin: 11 g/dL — ABNORMAL LOW (ref 12.0–15.0)
MCH: 30.3 pg (ref 26.0–34.0)
MCH: 30.9 pg (ref 26.0–34.0)
MCHC: 30.9 g/dL (ref 30.0–36.0)
MCHC: 31.9 g/dL (ref 30.0–36.0)
MCV: 96.9 fL (ref 78.0–100.0)
MCV: 98.1 fL (ref 78.0–100.0)
Platelets: 249 10*3/uL (ref 150–400)
Platelets: 267 10*3/uL (ref 150–400)
RBC: 3.53 MIL/uL — ABNORMAL LOW (ref 3.87–5.11)
RBC: 3.63 MIL/uL — ABNORMAL LOW (ref 3.87–5.11)
RDW: 15.8 % — ABNORMAL HIGH (ref 11.5–15.5)
RDW: 15.9 % — ABNORMAL HIGH (ref 11.5–15.5)
WBC: 8.1 10*3/uL (ref 4.0–10.5)
WBC: 9.5 10*3/uL (ref 4.0–10.5)

## 2013-01-17 LAB — URINALYSIS, ROUTINE W REFLEX MICROSCOPIC
Bilirubin Urine: NEGATIVE
Glucose, UA: NEGATIVE mg/dL
Hgb urine dipstick: NEGATIVE
Ketones, ur: NEGATIVE mg/dL
Leukocytes, UA: NEGATIVE
Nitrite: NEGATIVE
Protein, ur: 100 mg/dL — AB
Specific Gravity, Urine: 1.013 (ref 1.005–1.030)
Urobilinogen, UA: 0.2 mg/dL (ref 0.0–1.0)
pH: 6 (ref 5.0–8.0)

## 2013-01-17 LAB — BLOOD GAS, ARTERIAL
Acid-base deficit: 3.7 mmol/L — ABNORMAL HIGH (ref 0.0–2.0)
Bicarbonate: 21.9 mEq/L (ref 20.0–24.0)
Drawn by: 308601
FIO2: 0.21 %
O2 Saturation: 89.2 %
Patient temperature: 98.6
TCO2: 20.7 mmol/L (ref 0–100)
pCO2 arterial: 45.2 mmHg — ABNORMAL HIGH (ref 35.0–45.0)
pH, Arterial: 7.308 — ABNORMAL LOW (ref 7.350–7.450)
pO2, Arterial: 55.1 mmHg — ABNORMAL LOW (ref 80.0–100.0)

## 2013-01-17 LAB — SALICYLATE LEVEL: Salicylate Lvl: <2 mg/dL — ABNORMAL LOW (ref 2.8–20.0)

## 2013-01-17 LAB — URINE MICROSCOPIC-ADD ON

## 2013-01-17 LAB — POCT I-STAT TROPONIN I: Troponin i, poc: 0.02 ng/mL (ref 0.00–0.08)

## 2013-01-17 LAB — TROPONIN I: Troponin I: <0.3 ng/mL (ref <0.30)

## 2013-01-17 LAB — LACTIC ACID, PLASMA: Lactic Acid, Venous: 0.6 mmol/L (ref 0.5–2.2)

## 2013-01-17 LAB — RAPID URINE DRUG SCREEN, HOSP PERFORMED
Amphetamines: NOT DETECTED
Barbiturates: NOT DETECTED
Benzodiazepines: NOT DETECTED
Cocaine: NOT DETECTED
Opiates: NOT DETECTED
Tetrahydrocannabinol: NOT DETECTED

## 2013-01-17 LAB — PRO B NATRIURETIC PEPTIDE: Pro B Natriuretic peptide (BNP): 6078 pg/mL — ABNORMAL HIGH (ref 0–125)

## 2013-01-17 LAB — TSH: TSH: 0.476 u[IU]/mL (ref 0.350–4.500)

## 2013-01-17 MED ORDER — FUROSEMIDE 10 MG/ML IJ SOLN: 20.0000 mg | Freq: Once | INTRAMUSCULAR | Status: DC

## 2013-01-17 MED ORDER — HYDRALAZINE HCL 50 MG PO TABS
50.0000 mg | ORAL_TABLET | Freq: Four times a day (QID) | ORAL | Status: DC
  Administered 2013-01-17 – 2013-01-22 (×22): 50 mg via ORAL
  Filled 2013-01-17 (×25): qty 1

## 2013-01-17 MED ORDER — DIVALPROEX SODIUM 500 MG PO DR TAB
1000.0000 mg | DELAYED_RELEASE_TABLET | Freq: Every day | ORAL | Status: DC
  Administered 2013-01-17 – 2013-01-21 (×5): 1000 mg via ORAL
  Filled 2013-01-17 (×6): qty 2

## 2013-01-17 MED ORDER — SODIUM CHLORIDE 0.9 % IJ SOLN
3.0000 mL | Freq: Two times a day (BID) | INTRAMUSCULAR | Status: DC
  Administered 2013-01-17: 3 mL via INTRAVENOUS

## 2013-01-17 MED ORDER — ASPIRIN EC 81 MG PO TBEC
81.0000 mg | DELAYED_RELEASE_TABLET | Freq: Every day | ORAL | Status: DC
  Administered 2013-01-17 – 2013-01-22 (×5): 81 mg via ORAL
  Filled 2013-01-17 (×6): qty 1

## 2013-01-17 MED ORDER — METOPROLOL SUCCINATE ER 100 MG PO TB24
100.0000 mg | ORAL_TABLET | Freq: Every day | ORAL | Status: DC
  Administered 2013-01-17 – 2013-01-22 (×6): 100 mg via ORAL
  Filled 2013-01-17 (×6): qty 1

## 2013-01-17 MED ORDER — ONDANSETRON HCL 4 MG/2ML IJ SOLN
4.0000 mg | Freq: Four times a day (QID) | INTRAMUSCULAR | Status: DC | PRN
  Administered 2013-01-19 – 2013-01-21 (×4): 4 mg via INTRAVENOUS
  Filled 2013-01-17 (×4): qty 2

## 2013-01-17 MED ORDER — ONDANSETRON HCL 4 MG PO TABS
4.0000 mg | ORAL_TABLET | Freq: Four times a day (QID) | ORAL | Status: DC | PRN
  Administered 2013-01-18: 4 mg via ORAL
  Filled 2013-01-17: qty 1

## 2013-01-17 MED ORDER — SIMVASTATIN 20 MG PO TABS
20.0000 mg | ORAL_TABLET | Freq: Every evening | ORAL | Status: DC
  Administered 2013-01-17 – 2013-01-21 (×5): 20 mg via ORAL
  Filled 2013-01-17 (×6): qty 1

## 2013-01-17 MED ORDER — SODIUM CHLORIDE 0.9 % IJ SOLN
3.0000 mL | Freq: Two times a day (BID) | INTRAMUSCULAR | Status: DC
  Administered 2013-01-17 – 2013-01-21 (×4): 3 mL via INTRAVENOUS

## 2013-01-17 MED ORDER — SODIUM CHLORIDE 0.9 % IJ SOLN: 3.0000 mL | INTRAMUSCULAR | Status: DC | PRN

## 2013-01-17 MED ORDER — SODIUM CHLORIDE 0.9 % IV SOLN: 250.0000 mL | INTRAVENOUS | Status: DC | PRN

## 2013-01-17 MED ORDER — ENOXAPARIN SODIUM 40 MG/0.4ML ~~LOC~~ SOLN
40.0000 mg | SUBCUTANEOUS | Status: DC
  Administered 2013-01-17: 40 mg via SUBCUTANEOUS
  Filled 2013-01-17 (×2): qty 0.4

## 2013-01-17 MED ORDER — ALUM & MAG HYDROXIDE-SIMETH 200-200-20 MG/5ML PO SUSP: 30.0000 mL | Freq: Four times a day (QID) | ORAL | Status: DC | PRN

## 2013-01-17 MED ORDER — SODIUM CHLORIDE 0.9 % IV SOLN
INTRAVENOUS | Status: DC
  Administered 2013-01-17 – 2013-01-18 (×2): via INTRAVENOUS

## 2013-01-17 NOTE — ED Provider Notes (Signed)
CSN: [id number]     Arrival date & time 01/16/13  2253 History   First MD Initiated Contact with Patient 01/16/13 2347     Chief Complaint  Patient presents with  . Shortness of Breath   (Consider location/radiation/quality/duration/timing/severity/associated sxs/prior Treatment) HPI 52 yo female presents to the ER from home with complaint of shortness of breath.  Pt is very somnolent, poor historian.   She reports 3-4 days of increasing SOB with any exertion, dropping items at all times, fatigue.  She reports since starting clonidine for sleep, 3 days ago, she has noticed these symptoms.  Pt was seen by her pcm today, told she had fluid in her ears.  She reports she was supposed to have medication called in, but hasn't happened yet.  Pt dropped her ice cream tonight after taking her night time medications, which upset her causing her come to ER.  She reports she is sob with all positions and with any motion.  No prior h/o chf, asthma.  She reports some cough.  She is a smoker Past Medical History  Diagnosis Date  . Mental disorder   . Depression   . Hypertension   . Diabetes mellitus without complication   . Overdose   . Tobacco use disorder 11/13/2012   Past Surgical History  Procedure Laterality Date  . Right knee replacement      she says it was last year.  . Esophagogastroduodenoscopy Left 11/14/2012    Procedure: ESOPHAGOGASTRODUODENOSCOPY (EGD);  Surgeon: Charna Elizabeth, MD;  Location: [facility];  Service: Endoscopy;  Laterality: Left;   No family history on file. History  Substance Use Topics  . Smoking status: Current Every Day Smoker -- 0.50 packs/day  . Smokeless tobacco: Never Used  . Alcohol Use: Yes   OB History   Grav Para Term Preterm Abortions TAB SAB Ect Mult Living                 Review of Systems  Unable to perform ROS: Mental status change    Allergies  Codeine sulfate  Home Medications   Current Outpatient Rx  Name  Route  Sig  Dispense  Refill  .  acetaminophen (TYLENOL) 500 MG tablet   Oral   Take 500 mg by mouth every 6 (six) hours as needed for pain.         . divalproex (DEPAKOTE) 500 MG DR tablet   Oral   Take 1,000 mg by mouth at bedtime.          . hydrALAZINE (APRESOLINE) 50 MG tablet   Oral   Take 1 tablet (50 mg total) by mouth every 6 (six) hours.   40 tablet   0   . metoprolol succinate (TOPROL-XL) 100 MG 24 hr tablet   Oral   Take 100 mg by mouth daily. Take with or immediately following a meal.         . mirtazapine (REMERON) 15 MG tablet   Oral   Take 15 mg by mouth at bedtime. At 9pm         . simvastatin (ZOCOR) 20 MG tablet   Oral   Take 20 mg by mouth every evening.         . ziprasidone (GEODON) 60 MG capsule   Oral   Take 120 mg by mouth at bedtime.          BP 125/88  Pulse 86  Temp(Src) 98.7 F (37.1 C) (Oral)  Resp 18  Ht 5\' 2"  (  1.575 m)  Wt 204 lb (92.534 kg)  BMI 37.3 kg/m2  SpO2 92% Physical Exam  Nursing note and vitals reviewed. Constitutional: She is oriented to person, place, and time. She appears well-developed.  Obese female, somnolent but arousable, difficult to understand speech due to no teeth  HENT:  Head: Normocephalic and atraumatic.  Nose: Nose normal.  Mouth/Throat: Oropharynx is clear and moist.  Neck: Normal range of motion. Neck supple. No JVD present. No tracheal deviation present. No thyromegaly present.  Cardiovascular: Normal rate, regular rhythm, normal heart sounds and intact distal pulses.  Exam reveals no gallop and no friction rub.   No murmur heard. Pulmonary/Chest: Effort normal and breath sounds normal. No stridor. No respiratory distress. She has no wheezes. She has no rales. She exhibits no tenderness.  Abdominal: Soft. Bowel sounds are normal. She exhibits no distension and no mass. There is no tenderness. There is no rebound and no guarding.  Musculoskeletal: Normal range of motion. She exhibits edema (trace bilaterally). She exhibits  no tenderness.  Lymphadenopathy:    She has no cervical adenopathy.  Neurological: She is oriented to person, place, and time. She exhibits normal muscle tone. Coordination normal.  Somnolent, but arousable  Skin: Skin is warm and dry. No rash noted. No erythema. No pallor.    ED Course  Procedures (including critical care time) Labs Review Labs Reviewed  CBC - Abnormal; Notable for the following:    RBC 3.53 (*)    Hemoglobin 10.9 (*)    HCT 34.2 (*)    RDW 15.9 (*)    All other components within normal limits  BASIC METABOLIC PANEL - Abnormal; Notable for the following:    BUN 39 (*)    Creatinine, Ser 3.38 (*)    Calcium 10.7 (*)    GFR calc non Af Amer 14 (*)    GFR calc Af Amer 17 (*)    All other components within normal limits  BLOOD GAS, ARTERIAL - Abnormal; Notable for the following:    pH, Arterial 7.308 (*)    pCO2 arterial 45.2 (*)    pO2, Arterial 55.1 (*)    Acid-base deficit 3.7 (*)    All other components within normal limits  PRO B NATRIURETIC PEPTIDE - Abnormal; Notable for the following:    Pro B Natriuretic peptide (BNP) 6078.0 (*)    All other components within normal limits  URINALYSIS, ROUTINE W REFLEX MICROSCOPIC - Abnormal; Notable for the following:    Protein, ur 100 (*)    All other components within normal limits  GLUCOSE, CAPILLARY  URINE MICROSCOPIC-ADD ON  SALICYLATE LEVEL  URINE RAPID DRUG SCREEN (HOSP PERFORMED)   Imaging Review Dg Chest 2 View  01/17/2013   *RADIOLOGY REPORT*  Clinical Data: Short of breath  CHEST - 2 VIEW  Comparison: 11/17/2012  Findings: The heart size appears normal.  The lung volumes are low. No airspace consolidation identified.  Atelectasis is noted within the left midlung.  IMPRESSION:  1. Left midlung atelectasis.   Original Report Authenticated By: Signa Kell, M.D.    Date: 01/17/2013  Rate: 81  Rhythm: normal sinus rhythm  QRS Axis: normal  Intervals: PR prolonged  ST/T Wave abnormalities: normal   Conduction Disutrbances:first-degree A-V block   Narrative Interpretation:   Old EKG Reviewed: unchanged   MDM   1. SOB (shortness of breath)   2. Somnolence   3. Tobacco use disorder   4. Altered mental state      52  yo female with reported increased sob, cough, dropping items.  Pt is confused, somnolent.  She has h/o salicylate overdose in past.  Sxs may be due to clonidine use.  Will check labs, ekg, chest xray, abg.  1:22 AM Pt with elevated bnp, no signs of pulmonary edema.  No chest pain indicating possible PE.  Pt talking with people not in room, reports children are here with her dinner.  She has history of psychosis.  Her BUN and Cr are elevated from prior.  Will d/w hospitalist for admission.    Olivia Mackie, MD 01/17/13 [phone]

## 2013-01-17 NOTE — ED Notes (Signed)
[place], pt's daughter updated on pt condition per pt request.

## 2013-01-17 NOTE — Progress Notes (Signed)
Subjective: Patient admitted this morning with shortness of breath, and somnolence. She has been on multiple psych medications, at this time patient is improving, though still falls asleep while talking. She has had multiple admissions in the past with similar complaints.  Filed Vitals:   01/17/13 1327  BP: 153/107  Pulse: 88  Temp: 98 F (36.7 C)  Resp: 22    Chest: Clear Bilaterally Heart : S1S2 RRR Abdomen: Soft, nontender Ext : No edema Neuro: Somnolent, but able to converse  A/P Somnolence Dyspnea Hypercalcemia  Continue to hold her psych meds We will start IV normal saline Follow serum calcium in the morning    Meredeth Ide [facility] Pager- [phone]

## 2013-01-17 NOTE — Progress Notes (Signed)
Writer reported to Dr. Sharl Ma that pt was having missed beats and HR dropping into upper 30's.

## 2013-01-17 NOTE — Progress Notes (Signed)
*  PRELIMINARY RESULTS* Echocardiogram 2D Echocardiogram has been performed.  Jennifer Lawson 01/17/2013, 2:42 PM

## 2013-01-17 NOTE — ED Notes (Addendum)
Called lab, Stanton Kidney stated that Salicylate can be ran off blood in lab at this time. Label sent down to lab

## 2013-01-17 NOTE — H&P (Signed)
PCP:   Jackie Plum, MD   Chief Complaint:  sob  HPI: 52 yo female h/o ckd, bipolar disorder, dm comes in with sob.  Pt is somnulent mildly.  She responds to voice and answers questions.  She denies any fevers.  Says she has been sob for several days.  Denies any n/v/d.  She denies any pain.  She has a h/o drug overdose.  Denies any drug overdose.  She is on multiple medications for her psychiatric mental illness.  Denies any cough.  Review of Systems:  Positive and negative as per HPI otherwise all other systems are negative, unsure of reliability of pt responses  Past Medical History: Past Medical History  Diagnosis Date  . Mental disorder   . Depression   . Hypertension   . Diabetes mellitus without complication   . Overdose   . Tobacco use disorder 11/13/2012   Past Surgical History  Procedure Laterality Date  . Right knee replacement      she says it was last year.  . Esophagogastroduodenoscopy Left 11/14/2012    Procedure: ESOPHAGOGASTRODUODENOSCOPY (EGD);  Surgeon: Charna Elizabeth, MD;  Location: [facility];  Service: Endoscopy;  Laterality: Left;    Medications: Prior to Admission medications   Medication Sig Start Date End Date Taking? Authorizing Provider  acetaminophen (TYLENOL) 500 MG tablet Take 500 mg by mouth every 6 (six) hours as needed for pain.    Historical Provider, MD  divalproex (DEPAKOTE) 500 MG DR tablet Take 1,000 mg by mouth at bedtime.     Historical Provider, MD  hydrALAZINE (APRESOLINE) 50 MG tablet Take 1 tablet (50 mg total) by mouth every 6 (six) hours. 11/15/12   Penny Pia, MD  metoprolol succinate (TOPROL-XL) 100 MG 24 hr tablet Take 100 mg by mouth daily. Take with or immediately following a meal.    Historical Provider, MD  mirtazapine (REMERON) 15 MG tablet Take 15 mg by mouth at bedtime. At 9pm    Historical Provider, MD  simvastatin (ZOCOR) 20 MG tablet Take 20 mg by mouth every evening.    Historical Provider, MD  ziprasidone (GEODON)  60 MG capsule Take 120 mg by mouth at bedtime.    Historical Provider, MD    Allergies:   Allergies  Allergen Reactions  . Codeine Sulfate Anaphylaxis    Daughter called about having this allergy   . Haldol [Haloperidol Lactate] Shortness Of Breath  . Risperidone And Related Shortness Of Breath    Social History:  reports that she has been smoking.  She has never used smokeless tobacco. She reports that  drinks alcohol. She reports that she uses illicit drugs.  Family History: none  Physical Exam: Filed Vitals:   01/17/13 0115 01/17/13 0210 01/17/13 0228 01/17/13 0323  BP: 136/108  161/101 158/114  Pulse: 85   88  Temp:   98.3 F (36.8 C)   TempSrc:   Oral   Resp: 21  24 21   Height:      Weight:      SpO2: 92% 94% 96% 94%   General appearance: alert, cooperative and no distress Head: Normocephalic, without obvious abnormality, atraumatic Eyes: negative Nose: Nares normal. Septum midline. Mucosa normal. No drainage or sinus tenderness. Neck: no JVD and supple, symmetrical, trachea midline Lungs: clear to auscultation bilaterally Heart: regular rate and rhythm, S1, S2 normal, no murmur, click, rub or gallop Abdomen: soft, non-tender; bowel sounds normal; no masses,  no organomegaly Extremities: extremities normal, atraumatic, no cyanosis or edema Pulses: 2+ and  symmetric Skin: Skin color, texture, turgor normal. No rashes or lesions Neurologic: Grossly normal  Mildly somnulent, arouses to voice and answers questions but easily doses off  Labs on Admission:   Recent Labs  01/16/13 2348  NA 139  K 4.7  CL 107  CO2 23  GLUCOSE 87  BUN 39*  CREATININE 3.38*  CALCIUM 10.7*    Recent Labs  01/16/13 2348  WBC 8.1  HGB 10.9*  HCT 34.2*  MCV 96.9  PLT 267   Radiological Exams on Admission: Dg Chest 2 View  01/17/2013   *RADIOLOGY REPORT*  Clinical Data: Short of breath  CHEST - 2 VIEW  Comparison: 11/17/2012  Findings: The heart size appears normal.  The  lung volumes are low. No airspace consolidation identified.  Atelectasis is noted within the left midlung.  IMPRESSION:  1. Left midlung atelectasis.   Original Report Authenticated By: Signa Kell, M.D.    Assessment/Plan  52 yo female with sob, a on ckd, bipolar disorder and ams of unclear etiology Principal Problem:   Somnolence Active Problems:   Bipolar disorder   ARF (acute renal failure)   CKD (chronic kidney disease)   Uncontrolled hypertension   SOB (shortness of breath)  abg is nonrevealing.  cxr also no infiltrate or significant edema.  No focal neuro def but is somunlent.  Will obtain ct head w/o contrast.  Also ck lactic acid.  ua is neg.  bnp is elevated, will serial enzymes and ck 2 d echo.  ????overmedication, hold her psych meds that could be sedating.  freq neuro cks.  May need to call nephro in am.  Tele.  Full code.   Sargon Scouten A 01/17/2013, 3:51 AM

## 2013-01-17 NOTE — Progress Notes (Signed)
Patient declined MyChart activation-does not have a computer 

## 2013-01-17 NOTE — ED Notes (Signed)
RT at bedside to collect ABG 

## 2013-01-18 LAB — COMPREHENSIVE METABOLIC PANEL
ALT: 6 U/L (ref 0–35)
AST: 8 U/L (ref 0–37)
Albumin: 2.9 g/dL — ABNORMAL LOW (ref 3.5–5.2)
Alkaline Phosphatase: 55 U/L (ref 39–117)
BUN: 30 mg/dL — ABNORMAL HIGH (ref 6–23)
CO2: 24 mEq/L (ref 19–32)
Calcium: 11.2 mg/dL — ABNORMAL HIGH (ref 8.4–10.5)
Chloride: 114 mEq/L — ABNORMAL HIGH (ref 96–112)
Creatinine, Ser: 2.61 mg/dL — ABNORMAL HIGH (ref 0.50–1.10)
GFR calc Af Amer: 23 mL/min — ABNORMAL LOW (ref >90)
GFR calc non Af Amer: 20 mL/min — ABNORMAL LOW (ref >90)
Glucose, Bld: 87 mg/dL (ref 70–99)
Potassium: 4.2 mEq/L (ref 3.5–5.1)
Sodium: 144 mEq/L (ref 135–145)
Total Bilirubin: 0.1 mg/dL — ABNORMAL LOW (ref 0.3–1.2)
Total Protein: 7.1 g/dL (ref 6.0–8.3)

## 2013-01-18 MED ORDER — CALCITONIN (SALMON) 200 UNIT/ML IJ SOLN
360.0000 [IU] | Freq: Two times a day (BID) | INTRAMUSCULAR | Status: AC
  Administered 2013-01-18 – 2013-01-20 (×4): 360 [IU] via SUBCUTANEOUS
  Filled 2013-01-18 (×6): qty 1.8

## 2013-01-18 MED ORDER — ENOXAPARIN SODIUM 30 MG/0.3ML ~~LOC~~ SOLN
30.0000 mg | SUBCUTANEOUS | Status: DC
  Administered 2013-01-18 – 2013-01-21 (×4): 30 mg via SUBCUTANEOUS
  Filled 2013-01-18 (×5): qty 0.3

## 2013-01-18 MED ORDER — FUROSEMIDE 10 MG/ML IJ SOLN
40.0000 mg | Freq: Two times a day (BID) | INTRAMUSCULAR | Status: DC
  Administered 2013-01-18: 40 mg via INTRAVENOUS
  Filled 2013-01-18 (×4): qty 4

## 2013-01-18 NOTE — Consult Note (Signed)
Renal Service Consult Note [facility]  Jennifer Lawson 01/18/2013 [name] Requesting Physician:  Dr Sharl Ma  Reason for Consult:  AMS, high calcium HPI: The patient is a 52 y.o. year-old with hx of HTN, depression, mental/bipolar disorder and DM. She was admitted on 9/11 yest for AMS and SOB.  She takes "multiple " psych medications at baseline.  Pt was somnolent, cxr and abg were normal.  Creat was 3.38, baseline 2.7-2.9.  UA, salicylate,  WBC, Hb, plts, LFT's, troponin, lactic acids were all normal.  Ca ++ was high at 11.2 today; review old chart shows Ca levels from 9.2 - 11.9 in the last 18 months.  Alkphos is 55. PTH in July was 124.  Home meds are depakote, toprol, zocor and geodon.        ROS  poor historian  Past Medical History  Past Medical History  Diagnosis Date  . Mental disorder   . Depression   . Hypertension   . Diabetes mellitus without complication   . Overdose   . Tobacco use disorder 11/13/2012   Past Surgical History  Past Surgical History  Procedure Laterality Date  . Right knee replacement      she says it was last year.  . Esophagogastroduodenoscopy Left 11/14/2012    Procedure: ESOPHAGOGASTRODUODENOSCOPY (EGD);  Surgeon: Charna Elizabeth, MD;  Location: [facility];  Service: Endoscopy;  Laterality: Left;   Family History History reviewed. No pertinent family history. Social History  reports that she has been smoking.  She has never used smokeless tobacco. She reports that  drinks alcohol. She reports that she uses illicit drugs. Allergies  Allergies  Allergen Reactions  . Codeine Sulfate Anaphylaxis    Daughter called about having this allergy   . Haldol [Haloperidol Lactate] Shortness Of Breath  . Risperidone And Related Shortness Of Breath   Home medications Prior to Admission medications   Medication Sig Start Date End Date Taking? Authorizing Provider  divalproex (DEPAKOTE) 500 MG DR tablet Take 1,000 mg by mouth at  bedtime.    Yes Historical Provider, MD  metoprolol succinate (TOPROL-XL) 100 MG 24 hr tablet Take 100 mg by mouth daily. Take with or immediately following a meal.   Yes Historical Provider, MD  simvastatin (ZOCOR) 20 MG tablet Take 20 mg by mouth every evening.   Yes Historical Provider, MD  ziprasidone (GEODON) 60 MG capsule Take 120 mg by mouth at bedtime.   Yes Historical Provider, MD   Liver Function Tests  Recent Labs Lab 01/18/13 0425  AST 8  ALT 6  ALKPHOS 55  BILITOT 0.1*  PROT 7.1  ALBUMIN 2.9*   No results found for this basename: LIPASE, AMYLASE,  in the last 168 hours CBC  Recent Labs Lab 01/16/13 2348 01/17/13 0530  WBC 8.1 9.5  HGB 10.9* 11.0*  HCT 34.2* 35.6*  MCV 96.9 98.1  PLT 267 249   Basic Metabolic Panel  Recent Labs Lab 01/16/13 2348 01/17/13 0530 01/18/13 0425  NA 139 144 144  K 4.7 4.5 4.2  CL 107 112 114*  CO2 23 24 24   GLUCOSE 87 89 87  BUN 39* 36* 30*  CREATININE 3.38* 3.34* 2.61*  CALCIUM 10.7* 11.0* 11.2*    Physical Exam  Blood pressure 149/89, pulse 94, temperature 98.8 F (37.1 C), temperature source Oral, resp. rate 22, height 5\' 2"  (1.575 m), weight 99.2 kg (218 lb 11.1 oz), SpO2 96.00%. Gen: adult female, obese, lying on side, sheets are wet, ?sweating, looks uncomfortable,  responds appropriately for the most part, lip-smacking Skin: no rash, cyanosis HEENT:  EOMI, sclera anicteric, throat clear, slightly dry Neck: + JVD, no LAN Chest: poor insp effort, no obvious rales or wheezing CV: regular, no M or rub, +S4, pedal pulses intact bilat Abdomen: soft, obese, NT, ND, no ascites, liver downo 4 cm Ext: trace LE edema bilat, no joint effusion, no gangrene/ulcers Neuro: nonfocal, gen weakness, did not get her up   Assessment: 1. Hypercalcemia- corr Ca = 12.0, unclear cause, needs full w/u with vit D metabolites, PTH, PTH related peptide, SPEP,UPEP and serum FLC.  PTH in July was not real high, not sure if she is taking any  Ca containing supplements at home. Getting lasix and saline for now, will add calcitonin SQ for 48 hrs. Will follow 2. AMS- prob related to high Ca in part 3. CKD IV , eGFR 84mL/min 4. Hx bipolar D/O, on depakote 5. HTN 6. DM 7. Vol depletion- prob resolved, BP is high now  Plan- Calcitonin, labs as above, change lasix 40 IV q 12 hrs, cont NS at 75  Vinson Moselle  MD Pager [phone]    Cell  [phone] 01/18/2013, 2:43 PM

## 2013-01-18 NOTE — Progress Notes (Signed)
Spoke with pt concerning home health. Pt was not sure that she need Home Health. Several calls made to daughter Etheleen Sia [phone] or [phone] with no answer. Pt states that she is at work.

## 2013-01-18 NOTE — Progress Notes (Signed)
[facility] PROGRESS NOTE  Jennifer Lawson ZOX:[id number] DOB: 1961-01-01 DOA: 01/16/2013 PCP: Jackie Plum, MD  Assessment/Plan: 1. Somnolence- improved, secondary to multiple psych meds. 2. Hypercalcemia- Elevated PTH, SPEp did not show M spike. May be contributing toher somnolence. Will get nephrology consult. 3. Hypertension- continue Hydralazine 50 mg qid, Toprol xl 4. CKD- cr has improved to 2.61, will get nephrology consult.  Code Status: Full code Family Communication: *discussed with patient in detail Disposition Plan: Home vs SNF   Consultants:  Nephrology  Procedures:  None  Antibiotics:  *None  HPI/Subjective: Patient seen and examined, more alert today.   Objective: Filed Vitals:   01/18/13 1342  BP: 149/89  Pulse:   Temp:   Resp:     Intake/Output Summary (Last 24 hours) at 01/18/13 1433 Last data filed at 01/18/13 0900  Gross per 24 hour  Intake 1831.25 ml  Output      0 ml  Net 1831.25 ml   Filed Weights   01/16/13 2310 01/17/13 0400 01/18/13 0558  Weight: 92.534 kg (204 lb) 97.75 kg (215 lb 8 oz) 99.2 kg (218 lb 11.1 oz)    Exam:   General:  Appear in no acute distress  Cardiovascular: S1s2 RRR  Respiratory: Clear bilaterally  Abdomen: Soft, nontender  Musculoskeletal: No edema  Data Reviewed: Basic Metabolic Panel:  Recent Labs Lab 01/16/13 2348 01/17/13 0530 01/18/13 0425  NA 139 144 144  K 4.7 4.5 4.2  CL 107 112 114*  CO2 23 24 24   GLUCOSE 87 89 87  BUN 39* 36* 30*  CREATININE 3.38* 3.34* 2.61*  CALCIUM 10.7* 11.0* 11.2*   Liver Function Tests:  Recent Labs Lab 01/18/13 0425  AST 8  ALT 6  ALKPHOS 55  BILITOT 0.1*  PROT 7.1  ALBUMIN 2.9*   No results found for this basename: LIPASE, AMYLASE,  in the last 168 hours No results found for this basename: AMMONIA,  in the last 168 hours CBC:  Recent Labs Lab 01/16/13 2348 01/17/13 0530  WBC 8.1 9.5  HGB 10.9* 11.0*  HCT 34.2* 35.6*  MCV  96.9 98.1  PLT 267 249   Cardiac Enzymes:  Recent Labs Lab 01/17/13 0530  TROPONINI <0.30   BNP (last 3 results)  Recent Labs  11/17/12 0933 01/17/13  PROBNP 643.4* 6078.0*   CBG:  Recent Labs Lab 01/16/13 2337  GLUCAP 92    No results found for this or any previous visit (from the past 240 hour(s)).   Studies: Dg Chest 2 View  01/17/2013   *RADIOLOGY REPORT*  Clinical Data: Short of breath  CHEST - 2 VIEW  Comparison: 11/17/2012  Findings: The heart size appears normal.  The lung volumes are low. No airspace consolidation identified.  Atelectasis is noted within the left midlung.  IMPRESSION:  1. Left midlung atelectasis.   Original Report Authenticated By: Signa Kell, M.D.   Ct Head Wo Contrast  01/17/2013   *RADIOLOGY REPORT*  Clinical Data: Altered mental status.  CT HEAD WITHOUT CONTRAST  Technique:  Contiguous axial images were obtained from the base of the skull through the vertex without contrast.  Comparison: 11/10/2012  Findings: The ventricles and sulci are symmetrical without significant effacement, displacement, or dilatation. No mass effect or midline shift. No abnormal extra-axial fluid collections. The grey-white matter junction is distinct. Basal cisterns are not effaced. No acute intracranial hemorrhage. No depressed skull fractures.  Visualized paranasal sinuses and mastoid air cells are clear except for retention cyst in the left maxillary  antrum.  No significant changes since the previous study.  IMPRESSION: No acute intracranial abnormalities.   Original Report Authenticated By: Burman Nieves, M.D.    Scheduled Meds: . aspirin EC  81 mg Oral Daily  . divalproex  1,000 mg Oral QHS  . enoxaparin (LOVENOX) injection  30 mg Subcutaneous Q24H  . furosemide  20 mg Intravenous Once  . hydrALAZINE  50 mg Oral Q6H  . metoprolol succinate  100 mg Oral Daily  . simvastatin  20 mg Oral QPM  . sodium chloride  3 mL Intravenous Q12H  . sodium chloride  3 mL  Intravenous Q12H   Continuous Infusions: . sodium chloride 75 mL/hr at 01/18/13 4540    Principal Problem:   Somnolence Active Problems:   Bipolar disorder   ARF (acute renal failure)   CKD (chronic kidney disease)   Uncontrolled hypertension   SOB (shortness of breath)    Time spent: 25 min    [facility] S  [facility] Pager [phone]*. If 7PM-7AM, please contact night-coverage at [email or web address], password [facility] 01/18/2013, 2:33 PM  LOS: 2 days

## 2013-01-18 NOTE — Progress Notes (Signed)
Patient had a restless night. Patient had intermittent pauses and SB 38 to 40 throughout the night. BP remains elevated after BP meds. were administered. Pt complained of headache and states" head has been hurting since I come here." BP 157/110 this am. SRP, RN

## 2013-01-19 ENCOUNTER — Inpatient Hospital Stay (HOSPITAL_COMMUNITY): Payer: Medicare Other

## 2013-01-19 LAB — RENAL FUNCTION PANEL
Albumin: 2.7 g/dL — ABNORMAL LOW (ref 3.5–5.2)
BUN: 28 mg/dL — ABNORMAL HIGH (ref 6–23)
CO2: 22 mEq/L (ref 19–32)
Calcium: 9.9 mg/dL (ref 8.4–10.5)
Chloride: 112 mEq/L (ref 96–112)
Creatinine, Ser: 2.46 mg/dL — ABNORMAL HIGH (ref 0.50–1.10)
GFR calc Af Amer: 25 mL/min — ABNORMAL LOW (ref >90)
GFR calc non Af Amer: 21 mL/min — ABNORMAL LOW (ref >90)
Glucose, Bld: 101 mg/dL — ABNORMAL HIGH (ref 70–99)
Phosphorus: 3 mg/dL (ref 2.3–4.6)
Potassium: 4.5 mEq/L (ref 3.5–5.1)
Sodium: 142 mEq/L (ref 135–145)

## 2013-01-19 LAB — VALPROIC ACID LEVEL: Valproic Acid Lvl: 49.8 ug/mL — ABNORMAL LOW (ref 50.0–100.0)

## 2013-01-19 LAB — VITAMIN D 25 HYDROXY (VIT D DEFICIENCY, FRACTURES): Vit D, 25-Hydroxy: 32 ng/mL (ref 30–89)

## 2013-01-19 MED ORDER — FUROSEMIDE 10 MG/ML IJ SOLN
60.0000 mg | Freq: Two times a day (BID) | INTRAMUSCULAR | Status: DC
  Administered 2013-01-19 – 2013-01-20 (×3): 60 mg via INTRAVENOUS
  Filled 2013-01-19 (×5): qty 6

## 2013-01-19 MED ORDER — DEXTROSE-NACL 5-0.2 % IV SOLN
INTRAVENOUS | Status: DC
  Administered 2013-01-19 – 2013-01-20 (×2): via INTRAVENOUS

## 2013-01-19 NOTE — Progress Notes (Signed)
[facility] PROGRESS NOTE  Jennifer Lawson ZOX:[id number] DOB: December 23, 1960 DOA: 01/16/2013 PCP: Jackie Plum, MD  Assessment/Plan: 1. Somnolence- improved, secondary to multiple psych meds. 2. Hypercalcemia- Elevated PTH, SPEP did not show M spike. May be contributing toher somnolence. Will get nephrology consult. 3. Hypertension- continue Hydralazine 50 mg qid, Toprol xl 4. CKD- Cr has improved to 2.61, nephrology consulted. Started on lasix by nephrology.  Code Status: Full code Family Communication: *discussed with daughter on phone Disposition Plan: Home vs SNF   Consultants:  Nephrology  Procedures:  None  Antibiotics:  *None  HPI/Subjective: Patient seen and examined, more alert today.    Objective: Filed Vitals:   01/19/13 1211  BP: 145/81  Pulse:   Temp:   Resp:     Intake/Output Summary (Last 24 hours) at 01/19/13 1418 Last data filed at 01/18/13 2152  Gross per 24 hour  Intake    825 ml  Output   1300 ml  Net   -475 ml   Filed Weights   01/16/13 2310 01/17/13 0400 01/18/13 0558  Weight: 92.534 kg (204 lb) 97.75 kg (215 lb 8 oz) 99.2 kg (218 lb 11.1 oz)    Exam:   General:  Appear in no acute distress  Cardiovascular: S1s2 RRR  Respiratory: Clear bilaterally  Abdomen: Soft, nontender  Musculoskeletal: No edema  Data Reviewed: Basic Metabolic Panel:  Recent Labs Lab 01/16/13 2348 01/17/13 0530 01/18/13 0425 01/19/13 0930  NA 139 144 144 142  K 4.7 4.5 4.2 4.5  CL 107 112 114* 112  CO2 23 24 24 22   GLUCOSE 87 89 87 101*  BUN 39* 36* 30* 28*  CREATININE 3.38* 3.34* 2.61* 2.46*  CALCIUM 10.7* 11.0* 11.2* 9.9  PHOS  --   --   --  3.0   Liver Function Tests:  Recent Labs Lab 01/18/13 0425 01/19/13 0930  AST 8  --   ALT 6  --   ALKPHOS 55  --   BILITOT 0.1*  --   PROT 7.1  --   ALBUMIN 2.9* 2.7*   No results found for this basename: LIPASE, AMYLASE,  in the last 168 hours No results found for this basename:  AMMONIA,  in the last 168 hours CBC:  Recent Labs Lab 01/16/13 2348 01/17/13 0530  WBC 8.1 9.5  HGB 10.9* 11.0*  HCT 34.2* 35.6*  MCV 96.9 98.1  PLT 267 249   Cardiac Enzymes:  Recent Labs Lab 01/17/13 0530  TROPONINI <0.30   BNP (last 3 results)  Recent Labs  11/17/12 0933 01/17/13  PROBNP 643.4* 6078.0*   CBG:  Recent Labs Lab 01/16/13 2337  GLUCAP 92    No results found for this or any previous visit (from the past 240 hour(s)).   Studies: No results found.  Scheduled Meds: . aspirin EC  81 mg Oral Daily  . calcitonin  360 Units Subcutaneous Q12H  . divalproex  1,000 mg Oral QHS  . enoxaparin (LOVENOX) injection  30 mg Subcutaneous Q24H  . furosemide  60 mg Intravenous BID  . hydrALAZINE  50 mg Oral Q6H  . metoprolol succinate  100 mg Oral Daily  . simvastatin  20 mg Oral QPM  . sodium chloride  3 mL Intravenous Q12H  . sodium chloride  3 mL Intravenous Q12H   Continuous Infusions: . dextrose 5 % and 0.2 % NaCl 75 mL/hr at 01/19/13 1050    Principal Problem:   Somnolence Active Problems:   Bipolar disorder   ARF (acute  renal failure)   CKD (chronic kidney disease)   Uncontrolled hypertension   SOB (shortness of breath)    Time spent: 25 min    [facility] S  [facility] Pager [phone]*. If 7PM-7AM, please contact night-coverage at [email or web address], password [facility] 01/19/2013, 2:18 PM  LOS: 3 days

## 2013-01-19 NOTE — Progress Notes (Signed)
Patient still has insatiable thirst. Educated to slow down on water as patient drinks so fast she makes herself nauseous. Dr. Sharl Ma notified. Erskin Burnet RN

## 2013-01-19 NOTE — Progress Notes (Addendum)
Renal Service Daily Progress Note [facility]  Subjective: Pt got very thirsty, drank 8 glasses of water and now has abd pain and is throwing up. Labs not back, voided numerous times into diaper after IV lasix started. UOP 1300 cc recorded, prob more , per RN  Physical Exam:  Blood pressure 135/91, pulse 91, temperature 97.9 F (36.6 C), temperature source Axillary, resp. rate 19, height 5\' 2"  (1.575 m), weight 99.2 kg (218 lb 11.1 oz), SpO2 99.00%. Gen: adult AAF, lying on side, not diaphoretic today, throwing up water HEENT: +periorbital edema Neck: + JVD, no LAN  Chest: poor insp effort, no obvious rales or wheezing  CV: regular, no M or rub, +S4, pedal pulses intact bilat  Abdomen: soft, NT, ND , liver down 4 cm Ext: trace LE edema bilat Neuro: nonfocal, gen weakness  Assessment/Plan:  1. Hypercalcemia- corr Ca = 12.0 yesterday, today's lab pend; cont saline and IV lasix, place foley, daily renal panel, cont calcitonin x 48hrs; work-up ordered, results all pending 2. AMS- prob related to high Ca, ordered valproic acid level 3. Hypernatremia- change IVF"s to hypotonic 4. CKD IV , eGFR 6mL/min 5. Hx bipolar D/O, on depakote 6. HTN/vol- suspect vol up now, cont metoprolol and hydralazine 7. DM  Vinson Moselle  MD Pager [phone]    Cell  [phone] 01/19/2013, 8:15 AM    Recent Labs Lab 01/16/13 2348 01/17/13 0530 01/18/13 0425  NA 139 144 144  K 4.7 4.5 4.2  CL 107 112 114*  CO2 23 24 24   GLUCOSE 87 89 87  BUN 39* 36* 30*  CREATININE 3.38* 3.34* 2.61*  CALCIUM 10.7* 11.0* 11.2*    Recent Labs Lab 01/18/13 0425  AST 8  ALT 6  ALKPHOS 55  BILITOT 0.1*  PROT 7.1  ALBUMIN 2.9*    Recent Labs Lab 01/16/13 2348 01/17/13 0530  WBC 8.1 9.5  HGB 10.9* 11.0*  HCT 34.2* 35.6*  MCV 96.9 98.1  PLT 267 249   . aspirin EC  81 mg Oral Daily  . calcitonin  360 Units Subcutaneous Q12H  . divalproex  1,000 mg Oral QHS  . enoxaparin (LOVENOX)  injection  30 mg Subcutaneous Q24H  . furosemide  40 mg Intravenous BID  . hydrALAZINE  50 mg Oral Q6H  . metoprolol succinate  100 mg Oral Daily  . simvastatin  20 mg Oral QPM  . sodium chloride  3 mL Intravenous Q12H  . sodium chloride  3 mL Intravenous Q12H   . sodium chloride 75 mL/hr at 01/18/13 0926   sodium chloride, alum & mag hydroxide-simeth, ondansetron (ZOFRAN) IV, ondansetron, sodium chloride

## 2013-01-20 ENCOUNTER — Inpatient Hospital Stay (HOSPITAL_COMMUNITY): Payer: Medicare Other

## 2013-01-20 DIAGNOSIS — R112 Nausea with vomiting, unspecified: Secondary | ICD-10-CM

## 2013-01-20 DIAGNOSIS — K8020 Calculus of gallbladder without cholecystitis without obstruction: Secondary | ICD-10-CM

## 2013-01-20 LAB — RENAL FUNCTION PANEL
Albumin: 2.8 g/dL — ABNORMAL LOW (ref 3.5–5.2)
BUN: 30 mg/dL — ABNORMAL HIGH (ref 6–23)
CO2: 23 mEq/L (ref 19–32)
Calcium: 9.7 mg/dL (ref 8.4–10.5)
Chloride: 104 mEq/L (ref 96–112)
Creatinine, Ser: 2.53 mg/dL — ABNORMAL HIGH (ref 0.50–1.10)
GFR calc Af Amer: 24 mL/min — ABNORMAL LOW (ref >90)
GFR calc non Af Amer: 21 mL/min — ABNORMAL LOW (ref >90)
Glucose, Bld: 89 mg/dL (ref 70–99)
Phosphorus: 3.6 mg/dL (ref 2.3–4.6)
Potassium: 4.4 mEq/L (ref 3.5–5.1)
Sodium: 138 mEq/L (ref 135–145)

## 2013-01-20 MED ORDER — ZOLPIDEM TARTRATE 5 MG PO TABS
5.0000 mg | ORAL_TABLET | Freq: Once | ORAL | Status: AC
  Administered 2013-01-20: 5 mg via ORAL
  Filled 2013-01-20: qty 1

## 2013-01-20 MED ORDER — ZOLPIDEM TARTRATE 10 MG PO TABS: 10.0000 mg | ORAL_TABLET | Freq: Once | ORAL | Status: DC

## 2013-01-20 MED ORDER — PROMETHAZINE HCL 25 MG PO TABS
12.5000 mg | ORAL_TABLET | Freq: Four times a day (QID) | ORAL | Status: DC | PRN
  Administered 2013-01-20: 12.5 mg via ORAL
  Filled 2013-01-20: qty 1

## 2013-01-20 NOTE — Progress Notes (Signed)
Renal Service Daily Progress Note [facility]  Subjective: C/O nausea, much more alert and conversant, very thirsty, she says its from Depakote.  Says she has had a lot of problems from taking lasix in the past, kidney issues.   Physical Exam:  Blood pressure 121/76, pulse 81, temperature 98.3 F (36.8 C), temperature source Oral, resp. rate 16, height 5\' 2"  (1.575 m), weight 93 kg (205 lb 0.4 oz), SpO2 94.00%. Gen: adult AAF much more alert and responsive, not vomiting today Neck: no JVD, no LAN  Chest: poor insp effort, no obvious rales or wheezing  CV: regular, no M or rub, +S4, pedal pulses intact bilat  Abdomen: soft, NT, ND , liver down 4 cm Ext: trace LE edema bilat Neuro: nonfocal, stronger, looks a lot better  25-OH vit D = 33  Assessmen:  1. Hypercalcemia- unclear cause, could be from prior chronic Li exposure which is described, doubt solid tumor malignancy (usually clinically obvious if causing hypercalcemia); awaiting myeloma studies, PTH and PTH related peptide. Ca down with calcitonin, she may benefit from pamidronate to keep levels down 2. AMS- improved with Ca correction, MS prob back to baseline 3. HyperNa+ - better 4. CKD IV , eGFR 59mL/min- per Dr Lowell Guitar in July felt CKD to be due to combination of chronic Lithium toxicity, NSAID's and nephrosclerosis. Stable renal function, creat 2.5 5. Hx bipolar D/O, on depakote; on a lot of psych medication 6. HTN/vol- cont metoprolol and hydralazine 7. Nausea- she had on EGD in July, ?chronic complaint 8. Hx chronic headaches  Rec: await studies, consider pamidronate, stop lasix, decrease IVF to 50/hr  Vinson Moselle  MD Pager [phone]    Cell  [phone] 01/20/2013, 3:50 PM    Recent Labs Lab 01/18/13 0425 01/19/13 0930 01/20/13 0905  NA 144 142 138  K 4.2 4.5 4.4  CL 114* 112 104  CO2 24 22 23   GLUCOSE 87 101* 89  BUN 30* 28* 30*  CREATININE 2.61* 2.46* 2.53*  CALCIUM 11.2* 9.9 9.7  PHOS  --   3.0 3.6    Recent Labs Lab 01/18/13 0425 01/19/13 0930 01/20/13 0905  AST 8  --   --   ALT 6  --   --   ALKPHOS 55  --   --   BILITOT 0.1*  --   --   PROT 7.1  --   --   ALBUMIN 2.9* 2.7* 2.8*    Recent Labs Lab 01/16/13 2348 01/17/13 0530  WBC 8.1 9.5  HGB 10.9* 11.0*  HCT 34.2* 35.6*  MCV 96.9 98.1  PLT 267 249   . aspirin EC  81 mg Oral Daily  . divalproex  1,000 mg Oral QHS  . enoxaparin (LOVENOX) injection  30 mg Subcutaneous Q24H  . furosemide  60 mg Intravenous BID  . hydrALAZINE  50 mg Oral Q6H  . metoprolol succinate  100 mg Oral Daily  . simvastatin  20 mg Oral QPM  . sodium chloride  3 mL Intravenous Q12H  . sodium chloride  3 mL Intravenous Q12H   . dextrose 5 % and 0.2 % NaCl 75 mL/hr at 01/19/13 1050   sodium chloride, alum & mag hydroxide-simeth, ondansetron (ZOFRAN) IV, ondansetron, sodium chloride

## 2013-01-20 NOTE — Consult Note (Signed)
Reason for Consult: Nausea and known gallstones Referring Physician: [name] is an 52 y.o. female.  HPI: Asked to see patient regarding cholelithiasis and persistent nausea. I know the patient from hospitalization in July. At that time she was also admitted with mental status changes and then was complaining of somewhat vague diffuse abdominal pain. Gallbladder ultrasound has shown cholelithiasis without evidence of cholecystitis. EGD at that time showed some moderate gastritis. Her symptoms and exam were nonspecific and we elected at that time to treat for gastritis and she improved and was discharged. The patient denies further abdominal pain but has had intermittent nausea. She was again admitted with mental status changes which have now improved and she complains of persistent nausea. She denies abdominal pain.  Past Medical History  Diagnosis Date  . Mental disorder   . Depression   . Hypertension   . Diabetes mellitus without complication   . Overdose   . Tobacco use disorder 11/13/2012    Past Surgical History  Procedure Laterality Date  . Right knee replacement      she says it was last year.  . Esophagogastroduodenoscopy Left 11/14/2012    Procedure: ESOPHAGOGASTRODUODENOSCOPY (EGD);  Surgeon: Charna Elizabeth, MD;  Location: [facility];  Service: Endoscopy;  Laterality: Left;    History reviewed. No pertinent family history.  Social History:  reports that she has been smoking.  She has never used smokeless tobacco. She reports that  drinks alcohol. She reports that she uses illicit drugs.  Allergies:  Allergies  Allergen Reactions  . Codeine Sulfate Anaphylaxis    Daughter called about having this allergy   . Haldol [Haloperidol Lactate] Shortness Of Breath  . Risperidone And Related Shortness Of Breath    Medications:  Prior to Admission:  Prescriptions prior to admission  Medication Sig Dispense Refill  . divalproex (DEPAKOTE) 500 MG DR tablet Take 1,000  mg by mouth at bedtime.       . metoprolol succinate (TOPROL-XL) 100 MG 24 hr tablet Take 100 mg by mouth daily. Take with or immediately following a meal.      . simvastatin (ZOCOR) 20 MG tablet Take 20 mg by mouth every evening.      . ziprasidone (GEODON) 60 MG capsule Take 120 mg by mouth at bedtime.        Results for orders placed during the hospital encounter of 01/16/13 (from the past 48 hour(s))  VITAMIN D 25 HYDROXY     Status: None   Collection Time    01/18/13  3:55 PM      Result Value Range   Vit D, 25-Hydroxy 32  30 - 89 ng/mL   Comment: (NOTE)     This assay accurately quantifies Vitamin D, which is the sum of the     25-Hydroxy forms of Vitamin D2 and D3.  Studies have shown that the     optimum concentration of 25-Hydroxy Vitamin D is 30 ng/mL or higher.      Concentrations of Vitamin D between 20 and 29 ng/mL are considered to     be insufficient and concentrations less than 20 ng/mL are considered     to be deficient for Vitamin D.     Performed at [facility]  VALPROIC ACID LEVEL     Status: Abnormal   Collection Time    01/19/13  9:30 AM      Result Value Range   Valproic Acid Lvl 49.8 (*) 50.0 - 100.0 ug/mL  Comment: Performed at [facility]  RENAL FUNCTION PANEL     Status: Abnormal   Collection Time    01/19/13  9:30 AM      Result Value Range   Sodium 142  135 - 145 mEq/L   Potassium 4.5  3.5 - 5.1 mEq/L   Chloride 112  96 - 112 mEq/L   CO2 22  19 - 32 mEq/L   Glucose, Bld 101 (*) 70 - 99 mg/dL   BUN 28 (*) 6 - 23 mg/dL   Creatinine, Ser 6.29 (*) 0.50 - 1.10 mg/dL   Calcium 9.9  8.4 - 52.8 mg/dL   Phosphorus 3.0  2.3 - 4.6 mg/dL   Albumin 2.7 (*) 3.5 - 5.2 g/dL   GFR calc non Af Amer 21 (*) >90 mL/min   GFR calc Af Amer 25 (*) >90 mL/min   Comment: (NOTE)     The eGFR has been calculated using the CKD EPI equation.     This calculation has not been validated in all clinical situations.     eGFR's persistently <90 mL/min signify  possible Chronic Kidney     Disease.  RENAL FUNCTION PANEL     Status: Abnormal   Collection Time    01/20/13  9:05 AM      Result Value Range   Sodium 138  135 - 145 mEq/L   Potassium 4.4  3.5 - 5.1 mEq/L   Chloride 104  96 - 112 mEq/L   CO2 23  19 - 32 mEq/L   Glucose, Bld 89  70 - 99 mg/dL   BUN 30 (*) 6 - 23 mg/dL   Creatinine, Ser 4.13 (*) 0.50 - 1.10 mg/dL   Calcium 9.7  8.4 - 24.4 mg/dL   Phosphorus 3.6  2.3 - 4.6 mg/dL   Albumin 2.8 (*) 3.5 - 5.2 g/dL   GFR calc non Af Amer 21 (*) >90 mL/min   GFR calc Af Amer 24 (*) >90 mL/min   Comment: (NOTE)     The eGFR has been calculated using the CKD EPI equation.     This calculation has not been validated in all clinical situations.     eGFR's persistently <90 mL/min signify possible Chronic Kidney     Disease.   Lab Results  Component Value Date   ALT 6 01/18/2013   AST 8 01/18/2013   ALKPHOS 55 01/18/2013   BILITOT 0.1* 01/18/2013    l Mr Brain Wo Contrast  01/19/2013   CLINICAL DATA:  Altered mental status.  The examination had to be discontinued prior to completion due to extensive patient motion and refusal of further imaging.  EXAM: MRI HEAD WITHOUT CONTRAST  TECHNIQUE: Multiplanar, multisequence MR imaging was performed. No intravenous contrast was administered.  COMPARISON:  CT head without contrast 01/17/2013.  FINDINGS: The diffusion-weighted images demonstrate no evidence for acute or subacute infarction. There is no hemorrhage or mass. No significant white matter disease is evident. The ventricles are of normal size. No significant extra-axial fluid collection is present.  Flow is present in the major intracranial arteries. The globes and orbits are intact. Mucosal thickening is evident in the left maxillary sinus and bilateral ethmoid air cells. The mastoid air cells are clear.  IMPRESSION: 1. Normal MRI appearance of the brain for age. 2. Mild sinus disease as described.   Electronically Signed   By: Gennette Pac   On:  01/19/2013 18:45    Review of Systems  Constitutional: Positive for malaise/fatigue.  Respiratory: Negative.   Cardiovascular: Negative.   Gastrointestinal: Positive for nausea and vomiting. Negative for abdominal pain, diarrhea and constipation.   Blood pressure 129/80, pulse 123, temperature 98.1 F (36.7 C), temperature source Oral, resp. rate 18, height 5\' 2"  (1.575 m), weight 205 lb 0.4 oz (93 kg), SpO2 99.00%. Physical Exam General: Somewhat fatigued appearing but responsive African American female, in no distress Skin: Warm and dry without rash or infection. HEENT: No palpable masses or thyromegaly. Sclera nonicteric.  Lungs: Breath sounds clear and equal without increased work of breathing Cardiovascular: Regular rate and rhythm without murmur.  Abdomen: Nondistended. There is mild but definite epigastric and right upper quadrant tenderness. The remainder of her abdomen is soft and nontender. No masses palpable. No organomegaly. No palpable hernias. Extremities: No edema or joint swelling or deformity. No chronic venous stasis changes. Neurologic: Alert and fully oriented.   Assessment/Plan: 52 year old female with multiple medical problems and somewhat difficult to evaluate clinically due to her chronic medical difficulties. She has a history of nonspecific abdominal pain and cholelithiasis. She now has persistent nausea. Her exam is significant for some right upper quadrant tenderness. We will repeat her gallbladder ultrasound to see if she has any evidence of acute cholecystitis. However I think she very likely could have chronic cholecystitis causing her GI symptoms. She has had an EGD showing only some mild to moderate gastritis. If her symptoms persist I believe it would be reasonable to consider laparoscopic cholecystectomy for chronic cholecystitis in an effort to relieve her symptoms. I discussed this with the patient and she understands and is willing to have surgery if we  feel that it might help.  Will follow.  Shawna Wearing T 01/20/2013, 10:26 AM

## 2013-01-20 NOTE — Progress Notes (Addendum)
[facility] PROGRESS NOTE  Jennifer Lawson ZOX:[id number] DOB: 1960/09/16 DOA: 01/16/2013 PCP: Jackie Plum, MD  Assessment/Plan: Somnolence- improved, secondary to multiple psych meds; improved  abd pain- recheck abd U/S, surgery consulted  Hypercalcemia- Elevated PTH, SPEP did not show M spike. resolved. Nephrology consulted- calcitonin  Hypertension- continue Hydralazine 50 mg qid, Toprol xl  CKD- Cr has improved to 2.61, nephrology consulted. Started on lasix by nephrology.  Bipolar- depakote  PT eval  Code Status: Full code Family Communication: patient Disposition Plan: PT eval   Consultants:  Nephrology  Procedures:  None  Antibiotics:  *None  HPI/Subjective: Awake, asking what's wrong with her Nurse reports excessive thirst.    Objective: Filed Vitals:   01/20/13 0458  BP: 113/70  Pulse: 82  Temp: 98.1 F (36.7 C)  Resp: 18    Intake/Output Summary (Last 24 hours) at 01/20/13 0902 Last data filed at 01/20/13 0849  Gross per 24 hour  Intake 5112.5 ml  Output   7550 ml  Net -2437.5 ml   Filed Weights   01/18/13 0558 01/19/13 1650 01/20/13 0300  Weight: 99.2 kg (218 lb 11.1 oz) 92.897 kg (204 lb 12.8 oz) 93 kg (205 lb 0.4 oz)    Exam:   General:  Appear in no acute distress  Cardiovascular: S1s2 RRR  Respiratory: Clear bilaterally  Abdomen: Soft, nontender  Musculoskeletal: No edema  Data Reviewed: Basic Metabolic Panel:  Recent Labs Lab 01/16/13 2348 01/17/13 0530 01/18/13 0425 01/19/13 0930  NA 139 144 144 142  K 4.7 4.5 4.2 4.5  CL 107 112 114* 112  CO2 23 24 24 22   GLUCOSE 87 89 87 101*  BUN 39* 36* 30* 28*  CREATININE 3.38* 3.34* 2.61* 2.46*  CALCIUM 10.7* 11.0* 11.2* 9.9  PHOS  --   --   --  3.0   Liver Function Tests:  Recent Labs Lab 01/18/13 0425 01/19/13 0930  AST 8  --   ALT 6  --   ALKPHOS 55  --   BILITOT 0.1*  --   PROT 7.1  --   ALBUMIN 2.9* 2.7*   No results found for this  basename: LIPASE, AMYLASE,  in the last 168 hours No results found for this basename: AMMONIA,  in the last 168 hours CBC:  Recent Labs Lab 01/16/13 2348 01/17/13 0530  WBC 8.1 9.5  HGB 10.9* 11.0*  HCT 34.2* 35.6*  MCV 96.9 98.1  PLT 267 249   Cardiac Enzymes:  Recent Labs Lab 01/17/13 0530  TROPONINI <0.30   BNP (last 3 results)  Recent Labs  11/17/12 0933 01/17/13  PROBNP 643.4* 6078.0*   CBG:  Recent Labs Lab 01/16/13 2337  GLUCAP 92    No results found for this or any previous visit (from the past 240 hour(s)).   Studies: Mr Brain Wo Contrast  01/19/2013   CLINICAL DATA:  Altered mental status.  The examination had to be discontinued prior to completion due to extensive patient motion and refusal of further imaging.  EXAM: MRI HEAD WITHOUT CONTRAST  TECHNIQUE: Multiplanar, multisequence MR imaging was performed. No intravenous contrast was administered.  COMPARISON:  CT head without contrast 01/17/2013.  FINDINGS: The diffusion-weighted images demonstrate no evidence for acute or subacute infarction. There is no hemorrhage or mass. No significant white matter disease is evident. The ventricles are of normal size. No significant extra-axial fluid collection is present.  Flow is present in the major intracranial arteries. The globes and orbits are intact. Mucosal thickening is evident  in the left maxillary sinus and bilateral ethmoid air cells. The mastoid air cells are clear.  IMPRESSION: 1. Normal MRI appearance of the brain for age. 2. Mild sinus disease as described.   Electronically Signed   By: Gennette Pac   On: 01/19/2013 18:45    Scheduled Meds: . aspirin EC  81 mg Oral Daily  . divalproex  1,000 mg Oral QHS  . enoxaparin (LOVENOX) injection  30 mg Subcutaneous Q24H  . furosemide  60 mg Intravenous BID  . hydrALAZINE  50 mg Oral Q6H  . metoprolol succinate  100 mg Oral Daily  . simvastatin  20 mg Oral QPM  . sodium chloride  3 mL Intravenous Q12H  .  sodium chloride  3 mL Intravenous Q12H   Continuous Infusions: . dextrose 5 % and 0.2 % NaCl 75 mL/hr at 01/19/13 1050    Principal Problem:   Somnolence Active Problems:   Bipolar disorder   ARF (acute renal failure)   CKD (chronic kidney disease)   Uncontrolled hypertension   SOB (shortness of breath)    Time spent: 25 min    Jennifer Lawson  [facility] Pager [phone]. If 7PM-7AM, please contact night-coverage at [email or web address], password [facility] 01/20/2013, 9:02 AM  LOS: 4 days

## 2013-01-21 LAB — VITAMIN D 1,25 DIHYDROXY
Vitamin D 1, 25 (OH)2 Total: 14 pg/mL — ABNORMAL LOW (ref 18–72)
Vitamin D2 1, 25 (OH)2: <8 pg/mL
Vitamin D3 1, 25 (OH)2: 14 pg/mL

## 2013-01-21 LAB — KAPPA/LAMBDA LIGHT CHAINS
Kappa free light chain: 5.53 mg/dL — ABNORMAL HIGH (ref 0.33–1.94)
Kappa, lambda light chain ratio: 1.51 (ref 0.26–1.65)
Lambda free light chains: 3.67 mg/dL — ABNORMAL HIGH (ref 0.57–2.63)

## 2013-01-21 LAB — RENAL FUNCTION PANEL
Albumin: 2.6 g/dL — ABNORMAL LOW (ref 3.5–5.2)
BUN: 36 mg/dL — ABNORMAL HIGH (ref 6–23)
CO2: 25 mEq/L (ref 19–32)
Calcium: 9.7 mg/dL (ref 8.4–10.5)
Chloride: 99 mEq/L (ref 96–112)
Creatinine, Ser: 2.63 mg/dL — ABNORMAL HIGH (ref 0.50–1.10)
GFR calc Af Amer: 23 mL/min — ABNORMAL LOW (ref >90)
GFR calc non Af Amer: 20 mL/min — ABNORMAL LOW (ref >90)
Glucose, Bld: 91 mg/dL (ref 70–99)
Phosphorus: 4.1 mg/dL (ref 2.3–4.6)
Potassium: 4 mEq/L (ref 3.5–5.1)
Sodium: 134 mEq/L — ABNORMAL LOW (ref 135–145)

## 2013-01-21 LAB — PARATHYROID HORMONE, INTACT (NO CA): PTH: 210 pg/mL — ABNORMAL HIGH (ref 14.0–72.0)

## 2013-01-21 MED ORDER — ZOLPIDEM TARTRATE 5 MG PO TABS
5.0000 mg | ORAL_TABLET | Freq: Once | ORAL | Status: AC
  Administered 2013-01-21: 5 mg via ORAL
  Filled 2013-01-21: qty 1

## 2013-01-21 MED ORDER — NICOTINE 21 MG/24HR TD PT24
21.0000 mg | MEDICATED_PATCH | Freq: Every day | TRANSDERMAL | Status: DC
  Administered 2013-01-21 – 2013-01-22 (×2): 21 mg via TRANSDERMAL
  Filled 2013-01-21 (×3): qty 1

## 2013-01-21 MED ORDER — ACETAMINOPHEN 325 MG PO TABS: 650.0000 mg | ORAL_TABLET | Freq: Four times a day (QID) | ORAL | Status: DC | PRN

## 2013-01-21 NOTE — Progress Notes (Signed)
Seen, agree with above.  Given nausea and recent dx of possible ulcer and gastritis, would request GI involvement to see if they think this problem is resolved prior to undertaking cholecystectomy in this pt.

## 2013-01-21 NOTE — Evaluation (Signed)
Physical Therapy Evaluation Patient Details Name: Jennifer Lawson MRN: [id number] DOB: [birth year] Today's Date: 01/21/2013 Time: 0945-1000 PT Time Calculation (min): 15 min  PT Assessment / Plan / Recommendation History of Present Illness  52 y.o. female with h/o drug overdose, bipolar,  R TKA admitted with somnolence, cholelithiasis.  Clinical Impression  **Pt reports nausea which limited activity tolerance. Min/guard assist for bed to recliner. Expect pt will tolerate ambulation once nausea resolves. She would benefit from acute PT to maximize safety and independence with mobility. *    PT Assessment  Patient needs continued PT services    Follow Up Recommendations  Home health PT    Does the patient have the potential to tolerate intense rehabilitation      Barriers to Discharge Decreased caregiver support lives with daughter who works    Engineer, agricultural with 5" wheels    Recommendations for Other Services OT consult   Frequency Min 3X/week    Precautions / Restrictions Precautions Precautions: None Restrictions Weight Bearing Restrictions: No   Pertinent Vitals/Pain *0/10 pain**      Mobility  Bed Mobility Bed Mobility: Supine to Sit Supine to Sit: 6: Modified independent (Device/Increase time);HOB elevated;With rails Transfers Transfers: Sit to Stand;Stand to Sit;Stand Pivot Transfers Sit to Stand: 4: Min guard;From bed Stand to Sit: 4: Min guard;To chair/3-in-1 Stand Pivot Transfers: 4: Min guard Details for Transfer Assistance: min/guard for safety Ambulation/Gait Ambulation/Gait Assistance: Not tested (comment) (deferred 2* nausea)    Exercises     PT Diagnosis: Difficulty walking;Generalized weakness  PT Problem List: Decreased activity tolerance;Decreased mobility PT Treatment Interventions: Gait training;Functional mobility training;Therapeutic exercise;Therapeutic activities;Patient/family education     PT  Goals(Current goals can be found in the care plan section) Acute Rehab PT Goals Patient Stated Goal: to be able to care for 1 y.o. grandson PT Goal Formulation: With patient Time For Goal Achievement: 02/04/13 Potential to Achieve Goals: Good  Visit Information  Last PT Received On: 01/21/13 Assistance Needed: +1 History of Present Illness: 52 y.o. female with h/o drug overdose, bipolar,  R TKA admitted with somnolence, cholelithiasis.       Prior Functioning  Home Living Family/patient expects to be discharged to:: Private residence Living Arrangements: Children (lives with daughter and 1 y.o. grandson, da works) Available Help at Discharge: Available PRN/intermittently Home Layout: One level Home Equipment: None Prior Function Level of Independence: Independent Communication Communication: No difficulties    Cognition  Cognition Arousal/Alertness: Awake/alert Behavior During Therapy: WFL for tasks assessed/performed Overall Cognitive Status: No family/caregiver present to determine baseline cognitive functioning (Pt slow to follow directions. A bit groggy.)    Extremity/Trunk Assessment Upper Extremity Assessment Upper Extremity Assessment: Overall WFL for tasks assessed Lower Extremity Assessment Lower Extremity Assessment: Overall WFL for tasks assessed Cervical / Trunk Assessment Cervical / Trunk Assessment: Normal   Balance Balance Balance Assessed: Yes Static Sitting Balance Static Sitting - Balance Support: Bilateral upper extremity supported;Feet supported Static Sitting - Level of Assistance: 6: Modified independent (Device/Increase time) Static Sitting - Comment/# of Minutes: 2  End of Session PT - End of Session Equipment Utilized During Treatment: Gait belt Activity Tolerance: Patient tolerated treatment well Patient left: in chair;with call bell/phone within reach Nurse Communication: Mobility status  GP     Ralene Bathe Kistler 01/21/2013,  10:06 AM [phone]

## 2013-01-21 NOTE — Progress Notes (Signed)
Renal Service Daily Progress Note [facility]  Subjective: Feeling much better  Wants to go home Calcium staying below 10 Still getting IVF at 50 Creatinine near baseline  Physical Exam:  Blood pressure 105/68, pulse 82, temperature 98.8 F (37.1 C), temperature source Oral, resp. rate 20, height 5\' 2"  (1.575 m), weight 93 kg (205 lb 0.4 oz), SpO2 98.00%. Gen: adult AAF much more alert; eating Neck: no JVD scar neck from prev surgery Chest:No crackles CV: regular, no M or rub, +S4, pedal pulses intact bilat  Abdomen: soft, NT, ND , liver down 4 cm Ext: trace LE edema bilat Neuro: nonfocal, awake, alert  25-OH vit D = 33 PTH, PTHRP, 1,25 vit D all pending  Assessment:  1. Hypercalcemia- unclear cause, could be from prior chronic Li exposure which is described, doubt solid tumor malignancy (usually clinically obvious if causing hypercalcemia); awaiting myeloma studies, PTH and PTH related peptide. Ca staying down post calcitonin Patient tells me she had 2 parathyroid glands removed "in [facility] about 10 years ago but she cannot recall anything about the details except that they told her she might go into a coma if they did not take out the glands" (she doesn't know the surgeon) So wonder if she had parathyroid adenomas???  A sestamibi scan would be helpful, and if negative an MRI of the neck  (PTH was 124 in July 2014) but probably best to wait until other studies back  2. AMS- improved with Ca correction, MS prob back to baseline 3. HyperNa+ - better 4. CKD IV , eGFR 58mL/min- per Dr Lowell Guitar in July felt CKD to be due to combination of chronic Lithium toxicity, NSAID's and nephrosclerosis. Stable renal function, creat 2.5 She missed her followup appt with him in August - will reschedule that for her - September 23rd at 9AM with Dr. Lowell Guitar - and he could be involved in monitoring/further dx of her hypercalcemia issues. 5. Hx bipolar D/O, on depakote; on a lot of psych  medication 6. HTN/vol- cont metoprolol and hydralazine 7. Nausea- she had on EGD in July, ?chronic complaint Not nauseous today 8. Hx chronic headaches  Rec:  Stop IVF, recheck calcium in AM  Still waiting on 1,25 Vit D, PTH, PTH related peptide.  Consider Sestamibi scan vs neck MRI (alternatively could do as outpt to allow these additional studies to come back first, which would probably be best course of action)    Recent Labs Lab 01/19/13 0930 01/20/13 0905 01/21/13 0500  NA 142 138 134*  K 4.5 4.4 4.0  CL 112 104 99  CO2 22 23 25   GLUCOSE 101* 89 91  BUN 28* 30* 36*  CREATININE 2.46* 2.53* 2.63*  CALCIUM 9.9 9.7 9.7  PHOS 3.0 3.6 4.1    Recent Labs Lab 01/18/13 0425 01/19/13 0930 01/20/13 0905 01/21/13 0500  AST 8  --   --   --   ALT 6  --   --   --   ALKPHOS 55  --   --   --   BILITOT 0.1*  --   --   --   PROT 7.1  --   --   --   ALBUMIN 2.9* 2.7* 2.8* 2.6*    Recent Labs Lab 01/16/13 2348 01/17/13 0530  WBC 8.1 9.5  HGB 10.9* 11.0*  HCT 34.2* 35.6*  MCV 96.9 98.1  PLT 267 249   . aspirin EC  81 mg Oral Daily  . divalproex  1,000 mg  Oral QHS  . enoxaparin (LOVENOX) injection  30 mg Subcutaneous Q24H  . hydrALAZINE  50 mg Oral Q6H  . metoprolol succinate  100 mg Oral Daily  . simvastatin  20 mg Oral QPM  . sodium chloride  3 mL Intravenous Q12H  . sodium chloride  3 mL Intravenous Q12H   . dextrose 5 % and 0.2 % NaCl 50 mL/hr at 01/20/13 1716   sodium chloride, alum & mag hydroxide-simeth, ondansetron (ZOFRAN) IV, ondansetron, promethazine, sodium chloride   Jennifer Lawson

## 2013-01-21 NOTE — Progress Notes (Signed)
OT Cancellation Note  Patient Details Name: Jennifer Lawson MRN: [id number] DOB: [birth year]   Cancelled Treatment:    Reason Eval/Treat Not Completed: Fatigue/lethargy limiting ability to participate . Will re attempt later this day or tomorrow.   Alba Cory 01/21/2013, 2:29 PM

## 2013-01-21 NOTE — Progress Notes (Signed)
[facility] PROGRESS NOTE  Jennifer Lawson RUE:[id number] DOB: Aug 10, 1960 DOA: 01/16/2013 PCP: Jackie Plum, MD  Assessment/Plan:  Somnolence- improved, secondary to multiple psych meds; improved.  Abd pain- Resolved, Ultrasound shows gall stones. Patient is asymptomatic at this time.Surgery following, possible cholecystectomy. Will get GI consult.  Hypercalcemia- Elevated PTH, SPEP did not show M spike. Calcium is improving, Patient had two parathyroid glands removed on [place] 10 yrs ago, ? Parathyroid adenomas. Will wait for PTH, vit d level.   Hypertension- continue Hydralazine 50 mg qid, Toprol xl  CKD- Cr has improved to 2.61, nephrology consulted. Started on lasix by nephrology.  Bipolar- depakote  PT eval  Code Status: Full code Family Communication: patient Disposition Plan: PT eval   Consultants:  Nephrology  Procedures:  None  Antibiotics:  *None  HPI/Subjective: Patient seen and examined, no new complaints. She is more awake and communicative.  Objective: Filed Vitals:   01/21/13 1300  BP: 102/77  Pulse: 78  Temp: 99.1 F (37.3 C)  Resp: 18    Intake/Output Summary (Last 24 hours) at 01/21/13 1648 Last data filed at 01/21/13 1340  Gross per 24 hour  Intake 816.67 ml  Output   2450 ml  Net -1633.33 ml   Filed Weights   01/18/13 0558 01/19/13 1650 01/20/13 0300  Weight: 99.2 kg (218 lb 11.1 oz) 92.897 kg (204 lb 12.8 oz) 93 kg (205 lb 0.4 oz)    Exam:   General:  Appear in no acute distress  Cardiovascular: S1s2 RRR  Respiratory: Clear bilaterally  Abdomen: Soft, nontender  Musculoskeletal: No edema  Data Reviewed: Basic Metabolic Panel:  Recent Labs Lab 01/17/13 0530 01/18/13 0425 01/19/13 0930 01/20/13 0905 01/21/13 0500  NA 144 144 142 138 134*  K 4.5 4.2 4.5 4.4 4.0  CL 112 114* 112 104 99  CO2 24 24 22 23 25   GLUCOSE 89 87 101* 89 91  BUN 36* 30* 28* 30* 36*  CREATININE 3.34* 2.61* 2.46* 2.53*  2.63*  CALCIUM 11.0* 11.2* 9.9 9.7 9.7  PHOS  --   --  3.0 3.6 4.1   Liver Function Tests:  Recent Labs Lab 01/18/13 0425 01/19/13 0930 01/20/13 0905 01/21/13 0500  AST 8  --   --   --   ALT 6  --   --   --   ALKPHOS 55  --   --   --   BILITOT 0.1*  --   --   --   PROT 7.1  --   --   --   ALBUMIN 2.9* 2.7* 2.8* 2.6*   No results found for this basename: LIPASE, AMYLASE,  in the last 168 hours No results found for this basename: AMMONIA,  in the last 168 hours CBC:  Recent Labs Lab 01/16/13 2348 01/17/13 0530  WBC 8.1 9.5  HGB 10.9* 11.0*  HCT 34.2* 35.6*  MCV 96.9 98.1  PLT 267 249   Cardiac Enzymes:  Recent Labs Lab 01/17/13 0530  TROPONINI <0.30   BNP (last 3 results)  Recent Labs  11/17/12 0933 01/17/13  PROBNP 643.4* 6078.0*   CBG:  Recent Labs Lab 01/16/13 2337  GLUCAP 92    No results found for this or any previous visit (from the past 240 hour(s)).   Studies: Mr Brain Wo Contrast  01/19/2013   CLINICAL DATA:  Altered mental status.  The examination had to be discontinued prior to completion due to extensive patient motion and refusal of further imaging.  EXAM: MRI  HEAD WITHOUT CONTRAST  TECHNIQUE: Multiplanar, multisequence MR imaging was performed. No intravenous contrast was administered.  COMPARISON:  CT head without contrast 01/17/2013.  FINDINGS: The diffusion-weighted images demonstrate no evidence for acute or subacute infarction. There is no hemorrhage or mass. No significant white matter disease is evident. The ventricles are of normal size. No significant extra-axial fluid collection is present.  Flow is present in the major intracranial arteries. The globes and orbits are intact. Mucosal thickening is evident in the left maxillary sinus and bilateral ethmoid air cells. The mastoid air cells are clear.  IMPRESSION: 1. Normal MRI appearance of the brain for age. 2. Mild sinus disease as described.   Electronically Signed   By: Gennette Pac    On: 01/19/2013 18:45   US Abdomen Complete  01/20/2013   CLINICAL DATA:  52 year old female with abdominal pain nausea vomiting.  EXAM: ABDOMEN ULTRASOUND  COMPARISON:  CT Abdomen and Pelvis 11/13/2012.  FINDINGS: Gallbladder  Numerous shadowing gallstones, individually measuring approximately 10 mm diameter. Gallbladder wall thickness remains normal up to 3 mm. No sonographic Murphy sign elicited. No pericholecystic fluid.  Common bile duct  Diameter: Normal, 5 mm.  Liver  No focal lesion identified. Within normal limits in parenchymal echogenicity.  IVC  No abnormality visualized.  Pancreas  Visualized portion unremarkable.  Spleen  Size and appearance within normal limits.  Right Kidney  Length: 12.2 cm. Diffuse increased cortical echogenicity (image 23). Small simple appearing exophytic lower pole cyst measuring 18 mm.  Left Kidney  Length: 11.2 cm. Diffusely increased cortical echogenicity. Small simple appearing midpole cyst up to 12 mm.  Abdominal aorta  No aneurysm visualized.  IMPRESSION: 1. Cholelithiasis. No sonographic evidence of acute cholecystitis. No biliary ductal dilatation.  2. Pronounced increased bilateral renal cortical echogenicity could reflect advanced chronic medical renal disease, or HIV nephropathy.   Electronically Signed   By: Augusto Gamble M.D.   On: 01/20/2013 14:18    Scheduled Meds: . aspirin EC  81 mg Oral Daily  . divalproex  1,000 mg Oral QHS  . enoxaparin (LOVENOX) injection  30 mg Subcutaneous Q24H  . hydrALAZINE  50 mg Oral Q6H  . metoprolol succinate  100 mg Oral Daily  . nicotine  21 mg Transdermal Daily  . simvastatin  20 mg Oral QPM  . sodium chloride  3 mL Intravenous Q12H  . sodium chloride  3 mL Intravenous Q12H   Continuous Infusions: . dextrose 5 % and 0.2 % NaCl 20 mL/hr at 01/21/13 1106    Principal Problem:   Somnolence Active Problems:   Bipolar disorder   ARF (acute renal failure)   CKD (chronic kidney disease)   Uncontrolled  hypertension   SOB (shortness of breath)    Time spent: 25 min    [facility]  [facility] Pager [phone]. If 7PM-7AM, please contact night-coverage at [email or web address], password [facility] 01/21/2013, 4:48 PM  LOS: 5 days

## 2013-01-21 NOTE — Progress Notes (Signed)
Subjective: She denies any abdominal pain this AM, no complaints while eating yesterday.  Objective: Vital signs in last 24 hours: Temp:  [98.3 F (36.8 C)-98.9 F (37.2 C)] 98.8 F (37.1 C) (09/15 0628) Pulse Rate:  [61-123] 61 (09/15 0628) Resp:  [16-20] 20 (09/15 0628) BP: (121-129)/(61-80) 121/61 mmHg (09/15 0628) SpO2:  [94 %-98 %] 98 % (09/15 0628) Last BM Date: 01/20/13 1920 ml Po recorded yesterday Diet: regular Afebrile, VSS Labs OK Intake/Output from previous day: 09/14 0701 - 09/15 0700 In: 3201.7 [P.O.:1920; I.V.:1281.7] Out: 4400 [Urine:4400] Intake/Output this shift:    General appearance: cooperative, no distress and she is awake and answers questions. GI: soft, non-tender; bowel sounds normal; no masses,  no organomegaly and no abdominal complaints at this point  Lab Results:  No results found for this basename: WBC, HGB, HCT, PLT,  in the last 72 hours  BMET  Recent Labs  01/20/13 0905 01/21/13 0500  NA 138 134*  K 4.4 4.0  CL 104 99  CO2 23 25  GLUCOSE 89 91  BUN 30* 36*  CREATININE 2.53* 2.63*  CALCIUM 9.7 9.7   PT/INR No results found for this basename: LABPROT, INR,  in the last 72 hours   Recent Labs Lab 01/18/13 0425 01/19/13 0930 01/20/13 0905 01/21/13 0500  AST 8  --   --   --   ALT 6  --   --   --   ALKPHOS 55  --   --   --   BILITOT 0.1*  --   --   --   PROT 7.1  --   --   --   ALBUMIN 2.9* 2.7* 2.8* 2.6*     Lipase     Component Value Date/Time   LIPASE 44 11/17/2012 0932     Studies/Results: Mr Brain Wo Contrast  01/19/2013   CLINICAL DATA:  Altered mental status.  The examination had to be discontinued prior to completion due to extensive patient motion and refusal of further imaging.  EXAM: MRI HEAD WITHOUT CONTRAST  TECHNIQUE: Multiplanar, multisequence MR imaging was performed. No intravenous contrast was administered.  COMPARISON:  CT head without contrast 01/17/2013.  FINDINGS: The diffusion-weighted images  demonstrate no evidence for acute or subacute infarction. There is no hemorrhage or mass. No significant white matter disease is evident. The ventricles are of normal size. No significant extra-axial fluid collection is present.  Flow is present in the major intracranial arteries. The globes and orbits are intact. Mucosal thickening is evident in the left maxillary sinus and bilateral ethmoid air cells. The mastoid air cells are clear.  IMPRESSION: 1. Normal MRI appearance of the brain for age. 2. Mild sinus disease as described.   Electronically Signed   By: Gennette Pac   On: 01/19/2013 18:45   US Abdomen Complete  01/20/2013   CLINICAL DATA:  52 year old female with abdominal pain nausea vomiting.  EXAM: ABDOMEN ULTRASOUND  COMPARISON:  CT Abdomen and Pelvis 11/13/2012.  FINDINGS: Gallbladder  Numerous shadowing gallstones, individually measuring approximately 10 mm diameter. Gallbladder wall thickness remains normal up to 3 mm. No sonographic Murphy sign elicited. No pericholecystic fluid.  Common bile duct  Diameter: Normal, 5 mm.  Liver  No focal lesion identified. Within normal limits in parenchymal echogenicity.  IVC  No abnormality visualized.  Pancreas  Visualized portion unremarkable.  Spleen  Size and appearance within normal limits.  Right Kidney  Length: 12.2 cm. Diffuse increased cortical echogenicity (image 23). Small simple appearing  exophytic lower pole cyst measuring 18 mm.  Left Kidney  Length: 11.2 cm. Diffusely increased cortical echogenicity. Small simple appearing midpole cyst up to 12 mm.  Abdominal aorta  No aneurysm visualized.  IMPRESSION: 1. Cholelithiasis. No sonographic evidence of acute cholecystitis. No biliary ductal dilatation.  2. Pronounced increased bilateral renal cortical echogenicity could reflect advanced chronic medical renal disease, or HIV nephropathy.   Electronically Signed   By: Augusto Gamble M.D.   On: 01/20/2013 14:18    Medications: . aspirin EC  81 mg Oral  Daily  . divalproex  1,000 mg Oral QHS  . enoxaparin (LOVENOX) injection  30 mg Subcutaneous Q24H  . hydrALAZINE  50 mg Oral Q6H  . metoprolol succinate  100 mg Oral Daily  . simvastatin  20 mg Oral QPM  . sodium chloride  3 mL Intravenous Q12H  . sodium chloride  3 mL Intravenous Q12H   Prior to Admission medications   Medication Sig Start Date End Date Taking? Authorizing Provider  divalproex (DEPAKOTE) 500 MG DR tablet Take 1,000 mg by mouth at bedtime.    Yes Historical Provider, MD  metoprolol succinate (TOPROL-XL) 100 MG 24 hr tablet Take 100 mg by mouth daily. Take with or immediately following a meal.   Yes Historical Provider, MD  simvastatin (ZOCOR) 20 MG tablet Take 20 mg by mouth every evening.   Yes Historical Provider, MD  ziprasidone (GEODON) 60 MG capsule Take 120 mg by mouth at bedtime.   Yes Historical Provider, MD     Assessment/Plan Cholelithiasis with abdominal pain Gastritis with EGD July 2014 Dr. Loreta Ave Chronic renal insuffiencey, creatinine is improving  Somnolence/Bipolar/Mental status changes/ on Depakote and Geodon for these issues. Hypercalcemia Hypertension  Plan:  Currently no abdominal pain, she was seen by Dr. Loreta Ave back in July.. I would have GI see before surgery for GB which is currently asymptomatic .  LOS: 5 days    Carnell Beavers 01/21/2013

## 2013-01-22 LAB — UIFE/LIGHT CHAINS/TP QN, 24-HR UR
Albumin, U: DETECTED
Alpha 1, Urine: DETECTED — AB
Alpha 2, Urine: DETECTED — AB
Beta, Urine: DETECTED — AB
Free Kappa Lt Chains,Ur: 3.05 mg/dL — ABNORMAL HIGH (ref 0.14–2.42)
Free Kappa/Lambda Ratio: 4.24 ratio (ref 2.04–10.37)
Free Lambda Lt Chains,Ur: 0.72 mg/dL — ABNORMAL HIGH (ref 0.02–0.67)
Gamma Globulin, Urine: DETECTED — AB
Total Protein, Urine: 36.5 mg/dL

## 2013-01-22 LAB — RENAL FUNCTION PANEL
Albumin: 2.6 g/dL — ABNORMAL LOW (ref 3.5–5.2)
BUN: 50 mg/dL — ABNORMAL HIGH (ref 6–23)
CO2: 24 mEq/L (ref 19–32)
Calcium: 9.7 mg/dL (ref 8.4–10.5)
Chloride: 100 mEq/L (ref 96–112)
Creatinine, Ser: 2.81 mg/dL — ABNORMAL HIGH (ref 0.50–1.10)
GFR calc Af Amer: 21 mL/min — ABNORMAL LOW (ref >90)
GFR calc non Af Amer: 18 mL/min — ABNORMAL LOW (ref >90)
Glucose, Bld: 91 mg/dL (ref 70–99)
Phosphorus: 4.4 mg/dL (ref 2.3–4.6)
Potassium: 4 mEq/L (ref 3.5–5.1)
Sodium: 134 mEq/L — ABNORMAL LOW (ref 135–145)

## 2013-01-22 LAB — PROTEIN ELECTROPHORESIS, SERUM
Albumin ELP: 49.7 % — ABNORMAL LOW (ref 55.8–66.1)
Alpha-1-Globulin: 7.7 % — ABNORMAL HIGH (ref 2.9–4.9)
Alpha-2-Globulin: 10 % (ref 7.1–11.8)
Beta 2: 5.7 % (ref 3.2–6.5)
Beta Globulin: 6.6 % (ref 4.7–7.2)
Gamma Globulin: 20.3 % — ABNORMAL HIGH (ref 11.1–18.8)
M-Spike, %: NOT DETECTED g/dL
Total Protein ELP: 5.5 g/dL — ABNORMAL LOW (ref 6.0–8.3)

## 2013-01-22 MED ORDER — HYDRALAZINE HCL 50 MG PO TABS: 50.0000 mg | ORAL_TABLET | Freq: Four times a day (QID) | ORAL | Status: DC

## 2013-01-22 MED ORDER — PANTOPRAZOLE SODIUM 40 MG PO TBEC: 40.0000 mg | DELAYED_RELEASE_TABLET | Freq: Every day | ORAL | Status: DC

## 2013-01-22 NOTE — Consult Note (Signed)
UNASSIGNED PATIENT  Reason for Consult: Nausea and vomiting Referring Physician: [facility]  Lonell Grandchild HPI: This is a 52 year old female admitted for nausea and vomiting.  She was previously admitted this past July with similar symptoms and altered mental status.  An EGD was performed and it revealed erosions and a small gastric ulcer.  She has a long history of NSAID use at that time.  The patient is also noted to have cholelithiasis.  Currently she is tolerating PO without any difficulty.  She denies taking any NSAIDs since her July hospitalization.  Past Medical History  Diagnosis Date  . Mental disorder   . Depression   . Hypertension   . Diabetes mellitus without complication   . Overdose   . Tobacco use disorder 11/13/2012    Past Surgical History  Procedure Laterality Date  . Right knee replacement      she says it was last year.  . Esophagogastroduodenoscopy Left 11/14/2012    Procedure: ESOPHAGOGASTRODUODENOSCOPY (EGD);  Surgeon: Charna Elizabeth, MD;  Location: [facility];  Service: Endoscopy;  Laterality: Left;    History reviewed. No pertinent family history.  Social History:  reports that she has been smoking.  She has never used smokeless tobacco. She reports that  drinks alcohol. She reports that she uses illicit drugs.  Allergies:  Allergies  Allergen Reactions  . Codeine Sulfate Anaphylaxis    Daughter called about having this allergy   . Haldol [Haloperidol Lactate] Shortness Of Breath  . Risperidone And Related Shortness Of Breath    Medications:  Scheduled: . aspirin EC  81 mg Oral Daily  . divalproex  1,000 mg Oral QHS  . enoxaparin (LOVENOX) injection  30 mg Subcutaneous Q24H  . hydrALAZINE  50 mg Oral Q6H  . metoprolol succinate  100 mg Oral Daily  . nicotine  21 mg Transdermal Daily  . simvastatin  20 mg Oral QPM  . sodium chloride  3 mL Intravenous Q12H  . sodium chloride  3 mL Intravenous Q12H   Continuous: . dextrose 5 % and 0.2 %  NaCl 20 mL/hr at 01/21/13 1106    Results for orders placed during the hospital encounter of 01/16/13 (from the past 24 hour(s))  RENAL FUNCTION PANEL     Status: Abnormal   Collection Time    01/22/13  3:58 AM      Result Value Range   Sodium 134 (*) 135 - 145 mEq/L   Potassium 4.0  3.5 - 5.1 mEq/L   Chloride 100  96 - 112 mEq/L   CO2 24  19 - 32 mEq/L   Glucose, Bld 91  70 - 99 mg/dL   BUN 50 (*) 6 - 23 mg/dL   Creatinine, Ser 4.09 (*) 0.50 - 1.10 mg/dL   Calcium 9.7  8.4 - 81.1 mg/dL   Phosphorus 4.4  2.3 - 4.6 mg/dL   Albumin 2.6 (*) 3.5 - 5.2 g/dL   GFR calc non Af Amer 18 (*) >90 mL/min   GFR calc Af Amer 21 (*) >90 mL/min     No results found.  ROS:  As stated above in the HPI otherwise negative.  Blood pressure 116/85, pulse 81, temperature 97.5 F (36.4 C), temperature source Oral, resp. rate 18, height 5\' 2"  (1.575 m), weight 199 lb 1.2 oz (90.3 kg), SpO2 97.00%.    PE: Gen: NAD, Alert and Oriented HEENT:  [place]/AT, EOMI Neck: Supple, no LAD Lungs: CTA Bilaterally CV: RRR without M/G/R ABM: Soft, NTND, +  BS Ext: No C/C/E  Assessment/Plan: 1) Nausea and vomiting. 2) History of gastritis and a small gastric ulcer secondary to NSAIDs. 3) Cholelithiasis.   From the prior findings on the EGD I do not feel that her current symptoms can be explained from gastritis or the small ulcer.  Clinically she is stable and tolerating PO and she wants to go home.  It is not unreasonable for her to undergo a lap chole, but this decision will be left up to the Surgeons.  She can certain have further evaluation as an outpatient.  Plan: 1) Okay to D/C home. 2) No GI intervention at this time.  Edina Winningham D 01/22/2013, 3:09 PM

## 2013-01-22 NOTE — Progress Notes (Signed)
Renal Service Daily Progress Note Jennifer Lawson  Subjective: I want to get out of here (waiting for gi consult prior to d/c)  Physical Exam:  Blood pressure 116/85, pulse 81, temperature 97.5 F (36.4 C), temperature source Oral, resp. rate 18, height 5\' 2"  (1.575 m), weight 90.3 kg (199 lb 1.2 oz), SpO2 97.00%. Gen: adult AAF  alert; eating Neck: no JVD scar neck from prev surgery Chest:No crackles CV: regular, no M or rub, +S4, pedal pulses intact bilat  Abdomen: soft, NT, ND , liver down 4 cm Ext: trace LE edema bilat Neuro: nonfocal, awake, alert  Results for Jennifer, Lawson (MRN [id number]) as of 01/22/2013 13:09  Ref. Range 01/18/2013 15:55  PTH Latest Range: 14.0-72.0 pg/mL 210.0 (H)   Results for Jennifer, Lawson (MRN [id number]) as of 01/22/2013 13:09  Ref. Range 01/18/2013 15:55  Vit D, 25-Hydroxy Latest Range: 30-89 ng/mL 32   Results for Jennifer, Lawson (MRN [id number]) as of 01/22/2013 13:09  Ref. Range 01/18/2013 15:55  Vitamin D2 1, 25 (OH) No range found <8    Recent Labs Lab 01/20/13 0905 01/21/13 0500 01/22/13 0358  NA 138 134* 134*  K 4.4 4.0 4.0  CL 104 99 100  CO2 23 25 24   GLUCOSE 89 91 91  BUN 30* 36* 50*  CREATININE 2.53* 2.63* 2.81*  CALCIUM 9.7 9.7 9.7  PHOS 3.6 4.1 4.4    Recent Labs Lab 01/18/13 0425  01/20/13 0905 01/21/13 0500 01/22/13 0358  AST 8  --   --   --   --   ALT 6  --   --   --   --   ALKPHOS 55  --   --   --   --   BILITOT 0.1*  --   --   --   --   PROT 7.1  --   --   --   --   ALBUMIN 2.9*  < > 2.8* 2.6* 2.6*  < > = values in this interval not displayed.  Recent Labs Lab 01/16/13 2348 01/17/13 0530  WBC 8.1 9.5  HGB 10.9* 11.0*  HCT 34.2* 35.6*  MCV 96.9 98.1  PLT 267 249   . aspirin EC  81 mg Oral Daily  . divalproex  1,000 mg Oral QHS  . enoxaparin (LOVENOX) injection  30 mg Subcutaneous Q24H  . hydrALAZINE  50 mg Oral Q6H  . metoprolol succinate  100 mg Oral Daily  . nicotine  21 mg  Transdermal Daily  . simvastatin  20 mg Oral QPM  . sodium chloride  3 mL Intravenous Q12H  . sodium chloride  3 mL Intravenous Q12H   . dextrose 5 % and 0.2 % NaCl 20 mL/hr at 01/21/13 1106   sodium chloride, acetaminophen, alum & mag hydroxide-simeth, ondansetron (ZOFRAN) IV, ondansetron, promethazine, sodium chloride  Assessment:  1. Hypercalcemia- I suspect this is hyperparathyroidism.  PTH is elevated, Vit D levels normal or low, no PTH-RP yet.  With prior history of parathyroid srugery, suspect this may be recurrent issue (perhaps adenoma;  we don't often see hypercalcemia with secondary hyperpara at her level of renal function).  Medication option pending neck imaging (sestamibi +/-MR of neck) would be sensipar but she may not be able to afford as VERY expensive.  We will recheck calcium at f/u OV with Dr. Lowell Guitar and decide on sensipar at that time. She does not want to stay in the hospital for "any more tests" at this time but agrees to  let us schedule neck imaging studies as outpt) 2. AMS- improved with Ca correction, MS prob back to baseline 3. HyperNa+ - better 4. CKD IV , eGFR 86mL/min- per Dr Lowell Guitar in July felt CKD to be due to combination of chronic Lithium toxicity, NSAID's and nephrosclerosis. Stable renal function, creat 2.5 She missed her followup appt with him in August - will reschedule that for her - September 23rd at 9AM with Dr. Lowell Guitar - and he could be involved in monitoring/further dx of her hypercalcemia issues. 5. Hx bipolar D/O, on depakote; on a lot of psych medication 6. HTN/vol- cont metoprolol and hydralazine 7. Nausea- she had on EGD in July, ?chronic complaint Not nauseous today 8. Hx chronic headaches 9.  Disposition - for discharge today. Renal followup as above.   Jennifer Lawson B

## 2013-01-22 NOTE — Discharge Summary (Addendum)
Physician Discharge Summary  Jennifer Lawson ZOX:[id number] DOB: 1960/10/27 DOA: 01/16/2013  PCP: Jackie Plum, MD  Admit date: 01/16/2013 Discharge date: 01/22/2013  Time spent: 50* minutes  Recommendations for Outpatient Follow-up:  1. *Follow up   Discharge Diagnoses:  Principal Problem:   Somnolence Active Problems:   Bipolar disorder   ARF (acute renal failure)   CKD (chronic kidney disease)   Uncontrolled hypertension   SOB (shortness of breath)   Discharge Condition: Stable  Diet recommendation: Low salt diet  Filed Weights   01/19/13 1650 01/20/13 0300 01/22/13 0653  Weight: 92.897 kg (204 lb 12.8 oz) 93 kg (205 lb 0.4 oz) 90.3 kg (199 lb 1.2 oz)    History of present illness:  52 yo female h/o ckd, bipolar disorder, dm comes in with sob. Pt is somnulent mildly. She responds to voice and answers questions. She denies any fevers. Says she has been sob for several days. Denies any n/v/d. She denies any pain. She has a h/o drug overdose. Denies any drug overdose. She is on multiple medications for her psychiatric mental illness.    Hospital Course:   Somnolence- improved, secondary to multiple psych meds and hypercalcemia; improved.   Abd pain- Resolved, Ultrasound shows gall stones. Patient is asymptomatic at this time.Surgery following, possible cholecystectomy as outpatient, Discussed with Dr Rowan Blase, no urgent need for cholecystectomy,as she is asymptomatic,  will follow as outpatient for surgery. GI has seen the patient, no new recommendations. Follow up Surgery in 4 weeks.  Hypercalcemia- Improved with IV fluids and lasix, Elevated PTH, SPEP did not show M spike. Calcium is improving, Patient had two parathyroid glands removed on [place] 10 yrs ago, ? Parathyroid adenomas.  Most likely Hyperparathyroidism PTH is elevated, Vit D levels normal or low, no PTH-RP yet. With prior history of parathyroid srugery, suspect this may be recurrent issue (perhaps  adenoma; we don't often see hypercalcemia with secondary hyperpara at her level of renal function).    Hypertension- continue Hydralazine 50 mg qid, Toprol xl   CKD- Cr has improved to 2.81, Will follow with nephrology on Sep 23, at 9 am.  Bipolar- Continue depakote  Patient wants to go home, will send her on HHPT and RN  Procedures:  *None  Consultations:  Nephrology  Surgery  GI  Discharge Exam: Filed Vitals:   01/22/13 0653  BP: 116/85  Pulse: 81  Temp: 97.5 F (36.4 C)  Resp: 18    General: Appear in no acute distress Cardiovascular: S1s2 RRR Respiratory: Clear bilaterally Abdomen; Soft, nontender, no organomegaly  Discharge Instructions     Medication List         divalproex 500 MG DR tablet  Commonly known as:  DEPAKOTE  Take 1,000 mg by mouth at bedtime.     hydrALAZINE 50 MG tablet  Commonly known as:  APRESOLINE  Take 1 tablet (50 mg total) by mouth every 6 (six) hours.     metoprolol succinate 100 MG 24 hr tablet  Commonly known as:  TOPROL-XL  Take 100 mg by mouth daily. Take with or immediately following a meal.     pantoprazole 40 MG tablet  Commonly known as:  PROTONIX  Take 1 tablet (40 mg total) by mouth daily.     simvastatin 20 MG tablet  Commonly known as:  ZOCOR  Take 20 mg by mouth every evening.     ziprasidone 60 MG capsule  Commonly known as:  GEODON  Take 120 mg by mouth at bedtime.  Allergies  Allergen Reactions  . Codeine Sulfate Anaphylaxis    Daughter called about having this allergy   . Haldol [Haloperidol Lactate] Shortness Of Breath  . Risperidone And Related Shortness Of Breath      The results of significant diagnostics from this hospitalization (including imaging, microbiology, ancillary and laboratory) are listed below for reference.    Significant Diagnostic Studies: Dg Chest 2 View  01/17/2013   *RADIOLOGY REPORT*  Clinical Data: Short of breath  CHEST - 2 VIEW  Comparison: 11/17/2012   Findings: The heart size appears normal.  The lung volumes are low. No airspace consolidation identified.  Atelectasis is noted within the left midlung.  IMPRESSION:  1. Left midlung atelectasis.   Original Report Authenticated By: Signa Kell, M.D.   Ct Head Wo Contrast  01/17/2013   *RADIOLOGY REPORT*  Clinical Data: Altered mental status.  CT HEAD WITHOUT CONTRAST  Technique:  Contiguous axial images were obtained from the base of the skull through the vertex without contrast.  Comparison: 11/10/2012  Findings: The ventricles and sulci are symmetrical without significant effacement, displacement, or dilatation. No mass effect or midline shift. No abnormal extra-axial fluid collections. The grey-white matter junction is distinct. Basal cisterns are not effaced. No acute intracranial hemorrhage. No depressed skull fractures.  Visualized paranasal sinuses and mastoid air cells are clear except for retention cyst in the left maxillary antrum.  No significant changes since the previous study.  IMPRESSION: No acute intracranial abnormalities.   Original Report Authenticated By: Burman Nieves, M.D.   Mr Brain Wo Contrast  01/19/2013   CLINICAL DATA:  Altered mental status.  The examination had to be discontinued prior to completion due to extensive patient motion and refusal of further imaging.  EXAM: MRI HEAD WITHOUT CONTRAST  TECHNIQUE: Multiplanar, multisequence MR imaging was performed. No intravenous contrast was administered.  COMPARISON:  CT head without contrast 01/17/2013.  FINDINGS: The diffusion-weighted images demonstrate no evidence for acute or subacute infarction. There is no hemorrhage or mass. No significant white matter disease is evident. The ventricles are of normal size. No significant extra-axial fluid collection is present.  Flow is present in the major intracranial arteries. The globes and orbits are intact. Mucosal thickening is evident in the left maxillary sinus and bilateral ethmoid  air cells. The mastoid air cells are clear.  IMPRESSION: 1. Normal MRI appearance of the brain for age. 2. Mild sinus disease as described.   Electronically Signed   By: Gennette Pac   On: 01/19/2013 18:45   US Abdomen Complete  01/20/2013   CLINICAL DATA:  52 year old female with abdominal pain nausea vomiting.  EXAM: ABDOMEN ULTRASOUND  COMPARISON:  CT Abdomen and Pelvis 11/13/2012.  FINDINGS: Gallbladder  Numerous shadowing gallstones, individually measuring approximately 10 mm diameter. Gallbladder wall thickness remains normal up to 3 mm. No sonographic Murphy sign elicited. No pericholecystic fluid.  Common bile duct  Diameter: Normal, 5 mm.  Liver  No focal lesion identified. Within normal limits in parenchymal echogenicity.  IVC  No abnormality visualized.  Pancreas  Visualized portion unremarkable.  Spleen  Size and appearance within normal limits.  Right Kidney  Length: 12.2 cm. Diffuse increased cortical echogenicity (image 23). Small simple appearing exophytic lower pole cyst measuring 18 mm.  Left Kidney  Length: 11.2 cm. Diffusely increased cortical echogenicity. Small simple appearing midpole cyst up to 12 mm.  Abdominal aorta  No aneurysm visualized.  IMPRESSION: 1. Cholelithiasis. No sonographic evidence of acute cholecystitis. No biliary ductal dilatation.  2. Pronounced increased bilateral renal cortical echogenicity could reflect advanced chronic medical renal disease, or HIV nephropathy.   Electronically Signed   By: Augusto Gamble M.D.   On: 01/20/2013 14:18    Microbiology: No results found for this or any previous visit (from the past 240 hour(s)).   Labs: Basic Metabolic Panel:  Recent Labs Lab 01/18/13 0425 01/19/13 0930 01/20/13 0905 01/21/13 0500 01/22/13 0358  NA 144 142 138 134* 134*  K 4.2 4.5 4.4 4.0 4.0  CL 114* 112 104 99 100  CO2 24 22 23 25 24   GLUCOSE 87 101* 89 91 91  BUN 30* 28* 30* 36* 50*  CREATININE 2.61* 2.46* 2.53* 2.63* 2.81*  CALCIUM 11.2* 9.9 9.7  9.7 9.7  PHOS  --  3.0 3.6 4.1 4.4   Liver Function Tests:  Recent Labs Lab 01/18/13 0425 01/19/13 0930 01/20/13 0905 01/21/13 0500 01/22/13 0358  AST 8  --   --   --   --   ALT 6  --   --   --   --   ALKPHOS 55  --   --   --   --   BILITOT 0.1*  --   --   --   --   PROT 7.1  --   --   --   --   ALBUMIN 2.9* 2.7* 2.8* 2.6* 2.6*   No results found for this basename: LIPASE, AMYLASE,  in the last 168 hours No results found for this basename: AMMONIA,  in the last 168 hours CBC:  Recent Labs Lab 01/16/13 2348 01/17/13 0530  WBC 8.1 9.5  HGB 10.9* 11.0*  HCT 34.2* 35.6*  MCV 96.9 98.1  PLT 267 249   Cardiac Enzymes:  Recent Labs Lab 01/17/13 0530  TROPONINI <0.30   BNP: BNP (last 3 results)  Recent Labs  11/17/12 0933 01/17/13  PROBNP 643.4* 6078.0*   CBG:  Recent Labs Lab 01/16/13 2337  GLUCAP 92       Signed:  Anndee Connett S  [facility] 01/22/2013, 3:16 PM

## 2013-01-22 NOTE — Evaluation (Signed)
Occupational Therapy Evaluation Patient Details Name: Jennifer Lawson MRN: [id number] DOB: 04-06-1961 Today's Date: 01/22/2013 Time: 9562-1308 OT Time Calculation (min): 17 min  OT Assessment / Plan / Recommendation History of present illness 52 y.o. female with h/o drug overdose, bipolar,  R TKA admitted with somnolence, cholelithiasis.   Clinical Impression   Pt reports feeling weak but was able to tolerate up to the bathroom for toileting needs and grooming and then to the chair. Needs verbal cues for safety with RW and hand placement.     OT Assessment  Patient needs continued OT Services    Follow Up Recommendations  Home health OT;Supervision/Assistance - 24 hour    Barriers to Discharge      Equipment Recommendations  3 in 1 bedside comode    Recommendations for Other Services    Frequency  Min 2X/week    Precautions / Restrictions Precautions Precautions: Fall Restrictions Weight Bearing Restrictions: No   Pertinent Vitals/Pain No c/o    ADL  Eating/Feeding: Simulated;Independent Where Assessed - Eating/Feeding: Chair Grooming: Performed;Wash/dry hands;Min guard Where Assessed - Grooming: Unsupported standing Upper Body Bathing: Simulated;Chest;Right arm;Left arm;Abdomen;Set up Where Assessed - Upper Body Bathing: Unsupported sitting Lower Body Bathing: Simulated;Min guard Where Assessed - Lower Body Bathing: Supported sit to stand Upper Body Dressing: Simulated;Set up Where Assessed - Upper Body Dressing: Unsupported sitting Lower Body Dressing: Simulated;Min guard Where Assessed - Lower Body Dressing: Supported sit to stand Toilet Transfer: Performed;Min guard (verbal cues for hand placement) Acupuncturist: Comfort height toilet;Grab bars Toileting - Clothing Manipulation and Hygiene: Performed;Min guard Where Assessed - Engineer, mining and Hygiene: Sit to stand from 3-in-1 or toilet Equipment Used: Rolling walker ADL  Comments: Pt states she feels weak and needs to use RW right now. DIscussed recommendation of 3in1 as pt tends to pull up using walker and discussed safety implications of doing so. She also tends to hold to walker to sit down which is not safe. Discussed how she can use handles on 3in1 for safety with functional transfers. She tends to push walker too far in front of her so she needed some verbal cues to stay close to walker. She states daughter will be available to assist.     OT Diagnosis: Generalized weakness  OT Problem List: Decreased strength OT Treatment Interventions: Self-care/ADL training;DME and/or AE instruction;Therapeutic activities;Patient/family education   OT Goals(Current goals can be found in the care plan section) Acute Rehab OT Goals Patient Stated Goal: go home OT Goal Formulation: With patient Time For Goal Achievement: 02/05/13 Potential to Achieve Goals: Good  Visit Information  Last OT Received On: 01/22/13 Assistance Needed: +1 History of Present Illness: 52 y.o. female with h/o drug overdose, bipolar,  R TKA admitted with somnolence, cholelithiasis.       Prior Functioning     Home Living Family/patient expects to be discharged to:: Private residence Living Arrangements: Children (lives with daughter and 1 y.o. grandson, da works) Available Help at Discharge: Available PRN/intermittently Home Layout: One level Home Equipment: None Prior Function Level of Independence: Independent Communication Communication: No difficulties         Vision/Perception     Copywriter, advertising Arousal/Alertness: Awake/alert Behavior During Therapy: WFL for tasks assessed/performed Overall Cognitive Status: Within Functional Limits for tasks assessed    Extremity/Trunk Assessment Upper Extremity Assessment Upper Extremity Assessment: Overall WFL for tasks assessed     Mobility Bed Mobility Bed Mobility: Supine to Sit Supine to Sit: 6: Modified independent  (Device/Increase time)  Transfers Transfers: Sit to Stand;Stand to Sit Sit to Stand: 4: Min guard;With upper extremity assist;From bed;From toilet Stand to Sit: 4: Min guard;With upper extremity assist;To chair/3-in-1;To toilet Details for Transfer Assistance: min/guard for safety for hand placement and aligning with commode before sitting.     Exercise     Balance Balance Balance Assessed: Yes Static Sitting Balance Static Sitting - Balance Support: Bilateral upper extremity supported;Feet supported Static Sitting - Level of Assistance: 6: Modified independent (Device/Increase time)   End of Session OT - End of Session Activity Tolerance: Patient tolerated treatment well Patient left: in chair;with call bell/phone within reach  GO     Lennox Laity [phone] 01/22/2013, 12:30 PM

## 2013-01-22 NOTE — Progress Notes (Signed)
Seen, examined.  Discussed with Dr. Sharl Ma.   Think pain primarily from hypercalcemia.   Follow up as outpt as needed.

## 2013-01-22 NOTE — Progress Notes (Signed)
[facility]   [facility] is providing the following services: RW and Commode  If patient discharges after hours, please call [phone].   Renard Hamper 01/22/2013, 3:29 PM

## 2013-01-22 NOTE — Progress Notes (Signed)
Subjective: Sleepy, no complaints of abdominal pain, says she hasn't been very hungry.  I can't tell what she is actually eating.  She wants to know if she can go home.  Objective: Vital signs in last 24 hours: Temp:  [97.5 F (36.4 C)-99.1 F (37.3 C)] 97.5 F (36.4 C) (09/16 0653) Pulse Rate:  [77-82] 81 (09/16 0653) Resp:  [18] 18 (09/16 0653) BP: (102-116)/(64-85) 116/85 mmHg (09/16 0653) SpO2:  [97 %] 97 % (09/16 0653) Weight:  [90.3 kg (199 lb 1.2 oz)] 90.3 kg (199 lb 1.2 oz) (09/16 0653) Last BM Date: 01/19/13 480 Po recorded Regular diet Afebrile, VSS Creatinine is 2.81 Gallstones on [place] 9/14, no thickening or pericholecystic fluid normal CBD Intake/Output from previous day: 09/15 0701 - 09/16 0700 In: 1083 [P.O.:480; I.V.:603] Out: 1300 [Urine:1300] Intake/Output this shift: Total I/O In: -  Out: 150 [Urine:150]  General appearance: alert, cooperative and no distress GI: soft, non-tender; bowel sounds normal; no masses,  no organomegaly  Lab Results:  No results found for this basename: WBC, HGB, HCT, PLT,  in the last 72 hours  BMET  Recent Labs  01/21/13 0500 01/22/13 0358  NA 134* 134*  K 4.0 4.0  CL 99 100  CO2 25 24  GLUCOSE 91 91  BUN 36* 50*  CREATININE 2.63* 2.81*  CALCIUM 9.7 9.7   PT/INR No results found for this basename: LABPROT, INR,  in the last 72 hours   Recent Labs Lab 01/18/13 0425 01/19/13 0930 01/20/13 0905 01/21/13 0500 01/22/13 0358  AST 8  --   --   --   --   ALT 6  --   --   --   --   ALKPHOS 55  --   --   --   --   BILITOT 0.1*  --   --   --   --   PROT 7.1  --   --   --   --   ALBUMIN 2.9* 2.7* 2.8* 2.6* 2.6*     Lipase     Component Value Date/Time   LIPASE 44 11/17/2012 0932     Studies/Results: US Abdomen Complete  01/20/2013   CLINICAL DATA:  52 year old female with abdominal pain nausea vomiting.  EXAM: ABDOMEN ULTRASOUND  COMPARISON:  CT Abdomen and Pelvis 11/13/2012.  FINDINGS: Gallbladder   Numerous shadowing gallstones, individually measuring approximately 10 mm diameter. Gallbladder wall thickness remains normal up to 3 mm. No sonographic Murphy sign elicited. No pericholecystic fluid.  Common bile duct  Diameter: Normal, 5 mm.  Liver  No focal lesion identified. Within normal limits in parenchymal echogenicity.  IVC  No abnormality visualized.  Pancreas  Visualized portion unremarkable.  Spleen  Size and appearance within normal limits.  Right Kidney  Length: 12.2 cm. Diffuse increased cortical echogenicity (image 23). Small simple appearing exophytic lower pole cyst measuring 18 mm.  Left Kidney  Length: 11.2 cm. Diffusely increased cortical echogenicity. Small simple appearing midpole cyst up to 12 mm.  Abdominal aorta  No aneurysm visualized.  IMPRESSION: 1. Cholelithiasis. No sonographic evidence of acute cholecystitis. No biliary ductal dilatation.  2. Pronounced increased bilateral renal cortical echogenicity could reflect advanced chronic medical renal disease, or HIV nephropathy.   Electronically Signed   By: Augusto Gamble M.D.   On: 01/20/2013 14:18    Medications: . aspirin EC  81 mg Oral Daily  . divalproex  1,000 mg Oral QHS  . enoxaparin (LOVENOX) injection  30 mg Subcutaneous Q24H  .  hydrALAZINE  50 mg Oral Q6H  . metoprolol succinate  100 mg Oral Daily  . nicotine  21 mg Transdermal Daily  . simvastatin  20 mg Oral QPM  . sodium chloride  3 mL Intravenous Q12H  . sodium chloride  3 mL Intravenous Q12H    Assessment/Plan Cholelithiasis with abdominal pain  Gastritis with EGD July 2014 Dr. Loreta Ave  Chronic renal insuffiencey, creatinine is improving  Somnolence/Bipolar/Mental status changes/ on Depakote and Geodon for these issues.  Hypercalcemia  Hypertension   Plan:  No real evidence for cholecystitis or symptomatic cholelithiasis at this point.  It was Dr. Arita Miss opinion GI should see and be sure gastritis is not an issue before considering cholecystectomy.  LOS: 6  days    Jennifer Lawson 01/22/2013

## 2013-01-24 LAB — PTH-RELATED PEPTIDE: PTH-related peptide: 13 pg/mL — ABNORMAL LOW (ref 14–27)

## 2013-02-01 DIAGNOSIS — N184 Chronic kidney disease, stage 4 (severe): Secondary | ICD-10-CM | POA: Diagnosis not present

## 2013-02-01 DIAGNOSIS — R609 Edema, unspecified: Secondary | ICD-10-CM | POA: Diagnosis not present

## 2013-02-20 ENCOUNTER — Telehealth (INDEPENDENT_AMBULATORY_CARE_PROVIDER_SITE_OTHER): Payer: Self-pay | Admitting: General Surgery

## 2013-02-20 ENCOUNTER — Telehealth (INDEPENDENT_AMBULATORY_CARE_PROVIDER_SITE_OTHER): Payer: Self-pay

## 2013-02-20 ENCOUNTER — Encounter (INDEPENDENT_AMBULATORY_CARE_PROVIDER_SITE_OTHER): Payer: Self-pay | Admitting: General Surgery

## 2013-02-20 ENCOUNTER — Other Ambulatory Visit (INDEPENDENT_AMBULATORY_CARE_PROVIDER_SITE_OTHER): Payer: Self-pay | Admitting: General Surgery

## 2013-02-20 ENCOUNTER — Ambulatory Visit (INDEPENDENT_AMBULATORY_CARE_PROVIDER_SITE_OTHER): Payer: Medicare Other | Admitting: General Surgery

## 2013-02-20 DIAGNOSIS — E213 Hyperparathyroidism, unspecified: Secondary | ICD-10-CM

## 2013-02-20 DIAGNOSIS — R112 Nausea with vomiting, unspecified: Secondary | ICD-10-CM

## 2013-02-20 DIAGNOSIS — E210 Primary hyperparathyroidism: Secondary | ICD-10-CM

## 2013-02-20 NOTE — Assessment & Plan Note (Signed)
The patient has had 2 parathyroids removed in the past. It is possible that she is developing some secondary hyperparathyroidism in addition to the primary considering her renal disease.  We will need to obtain her records for her 2 prior parathyroidectomies. I will also do a sestamibi scan.

## 2013-02-20 NOTE — Telephone Encounter (Signed)
LMOM asking pt to return my call.  This is so that I may inform her that we have scheduled her for NM parathyroid scan at [facility] radiology on 02/27/13 with an arrival time of 7:45.

## 2013-02-20 NOTE — Assessment & Plan Note (Signed)
This patient certainly may have an element of gallbladder disease, however her main complaints are her lower abdominal pain and pelvic pain.  I would want to address her hypercalcemia and her parathyroids prior to undergoing laparoscopic cholecystectomy.  It is not clear to me that cholecystectomy will improve her symptoms.

## 2013-02-20 NOTE — Telephone Encounter (Signed)
LM with nurse for ob/gyn recommendation for this patient.

## 2013-02-20 NOTE — Progress Notes (Signed)
Chief Complaint  Patient presents with  . New Evaluation    eval GB    HISTORY: Is a 52 year old female who presents with multiple complaints. When I ask her about her complaints, she tells me her primary problem is lower abdominal and pelvic pain. She was in the hospital around a month ago with similar complaints in addition to nausea and vomiting. She was found to be hypercalcemic at that time but was also found to have gallstones. During this hospitalization, she also had mental status changes. It was felt that much of her symptomatology was related to her hypercalcemia. She presents for followup to evaluate for potential surgical planning. She does state that she has some nausea every day. This is principally when she eats. She's had 2 parathyroidectomies in the past for hypercalcemia. The patient is not a good historian.  Past Medical History  Diagnosis Date  . Mental disorder   . Depression   . Hypertension   . Diabetes mellitus without complication   . Overdose   . Tobacco use disorder 11/13/2012    Past Surgical History  Procedure Laterality Date  . Right knee replacement      she says it was last year.  . Esophagogastroduodenoscopy Left 11/14/2012    Procedure: ESOPHAGOGASTRODUODENOSCOPY (EGD);  Surgeon: Charna Elizabeth, MD;  Location: [facility];  Service: Endoscopy;  Laterality: Left;    Current Outpatient Prescriptions  Medication Sig Dispense Refill  . amLODipine (NORVASC) 10 MG tablet       . divalproex (DEPAKOTE) 500 MG DR tablet Take 1,000 mg by mouth at bedtime.       . hydrALAZINE (APRESOLINE) 50 MG tablet Take 1 tablet (50 mg total) by mouth every 6 (six) hours.  40 tablet  0  . metoprolol succinate (TOPROL-XL) 100 MG 24 hr tablet Take 100 mg by mouth daily. Take with or immediately following a meal.      . omeprazole (PRILOSEC) 20 MG capsule       . pantoprazole (PROTONIX) 40 MG tablet Take 1 tablet (40 mg total) by mouth daily.  30 tablet  2  . simvastatin (ZOCOR) 20  MG tablet Take 20 mg by mouth every evening.      . ziprasidone (GEODON) 60 MG capsule Take 120 mg by mouth at bedtime.       No current facility-administered medications for this visit.     Allergies  Allergen Reactions  . Codeine Sulfate Anaphylaxis    Daughter called about having this allergy   . Haldol [Haloperidol Lactate] Shortness Of Breath  . Risperidone And Related Shortness Of Breath     History reviewed. No pertinent family history.   History   Social History  . Marital Status: Divorced    Spouse Name: N/A    Number of Children: N/A  . Years of Education: N/A   Social History Main Topics  . Smoking status: Current Every Day Smoker -- 0.50 packs/day  . Smokeless tobacco: Never Used  . Alcohol Use: Yes  . Drug Use: Yes     Comment: per daughter, pt takes narcotics  . Sexual Activity: No  \  REVIEW OF SYSTEMS - PERTINENT POSITIVES ONLY: 12 point review of systems negative other than HPI and PMH  EXAM: Filed Vitals:   02/20/13 0911  BP: 138/84  Pulse: 72  Resp: 16     Gen:  No acute distress.  Well nourished and well groomed.   Neurological: Alert and oriented to person, place, and time. Coordination  normal.  Head: Normocephalic and atraumatic.  Eyes: Conjunctivae are normal. Pupils are equal, round, and reactive to light. No scleral icterus.  Neck: Normal range of motion. Neck supple. No tracheal deviation or thyromegaly present.  Cardiovascular: Normal rate, regular rhythm, normal heart sounds and intact distal pulses.  Exam reveals no gallop and no friction rub.  No murmur heard. Respiratory: Effort normal.  No respiratory distress. No chest wall tenderness. Breath sounds normal.  No wheezes, rales or rhonchi.  GI: Soft. Bowel sounds are normal. The abdomen is soft and nontender.  There is no rebound and no guarding.  There is sl distention. Musculoskeletal: Normal range of motion. Extremities are nontender.  Lymphadenopathy: No cervical,  preauricular, postauricular or axillary adenopathy is present Skin: Skin is warm and dry. No rash noted. No diaphoresis. No erythema. No pallor. No clubbing, cyanosis, or edema.   Psychiatric: Flat affect.  Poor insight into disease.  Does not seem to answer questions that are asked.     LABORATORY RESULTS: Available labs are reviewed  PTH 210, cr 2.63, Ca 11.2 down to 9.7  RADIOLOGY RESULTS: See E-Chart or I-Site for most recent results.  Images and reports are reviewed. Ultrasound IMPRESSION:  1. Cholelithiasis. No sonographic evidence of acute cholecystitis.  No biliary ductal dilatation.  2. Pronounced increased bilateral renal cortical echogenicity could  reflect advanced chronic medical renal disease, or HIV nephropathy.     ASSESSMENT AND PLAN: Nausea with vomiting This patient certainly may have an element of gallbladder disease, however her main complaints are her lower abdominal pain and pelvic pain.  I would want to address her hypercalcemia and her parathyroids prior to undergoing laparoscopic cholecystectomy.  It is not clear to me that cholecystectomy will improve her symptoms.  Hyperparathyroidism, primary The patient has had 2 parathyroids removed in the past. It is possible that she is developing some secondary hyperparathyroidism in addition to the primary considering her renal disease.  We will need to obtain her records for her 2 prior parathyroidectomies. I will also do a sestamibi scan.    Hypercalcemia This has improved with the Lasix. She also may need to take Sensipar we think that her hypercalcemia is partially related to her renal disease.      Maudry Diego MD Surgical Oncology, General and Endocrine Surgery [facility]   30 min spent in exam, counseling, coordination of care.     Visit Diagnoses: No diagnosis found.  Primary Care Physician: Jackie Plum, MD

## 2013-02-20 NOTE — Assessment & Plan Note (Signed)
This has improved with the Lasix. She also may need to take Sensipar we think that her hypercalcemia is partially related to her renal disease.

## 2013-02-20 NOTE — Patient Instructions (Signed)
We will get a sestamibi scan to evaluate your parathyroids.    We also need to get your operative notes from [facility] to see which 2 parathyroids they took out.  Once we have that information, we will determine what type of surgery you need.

## 2013-02-21 DIAGNOSIS — F319 Bipolar disorder, unspecified: Secondary | ICD-10-CM | POA: Diagnosis not present

## 2013-02-21 DIAGNOSIS — R42 Dizziness and giddiness: Secondary | ICD-10-CM | POA: Diagnosis not present

## 2013-02-21 DIAGNOSIS — I10 Essential (primary) hypertension: Secondary | ICD-10-CM | POA: Diagnosis not present

## 2013-02-21 DIAGNOSIS — N183 Chronic kidney disease, stage 3 unspecified: Secondary | ICD-10-CM | POA: Diagnosis not present

## 2013-02-21 DIAGNOSIS — N898 Other specified noninflammatory disorders of vagina: Secondary | ICD-10-CM | POA: Diagnosis not present

## 2013-02-21 DIAGNOSIS — R10819 Abdominal tenderness, unspecified site: Secondary | ICD-10-CM | POA: Diagnosis not present

## 2013-02-21 DIAGNOSIS — F17200 Nicotine dependence, unspecified, uncomplicated: Secondary | ICD-10-CM | POA: Diagnosis not present

## 2013-02-21 DIAGNOSIS — I509 Heart failure, unspecified: Secondary | ICD-10-CM | POA: Diagnosis not present

## 2013-02-22 ENCOUNTER — Other Ambulatory Visit (HOSPITAL_COMMUNITY): Payer: Self-pay | Admitting: Internal Medicine

## 2013-02-22 ENCOUNTER — Telehealth (INDEPENDENT_AMBULATORY_CARE_PROVIDER_SITE_OTHER): Payer: Self-pay | Admitting: *Deleted

## 2013-02-22 DIAGNOSIS — R102 Pelvic and perineal pain: Secondary | ICD-10-CM

## 2013-02-22 NOTE — Telephone Encounter (Signed)
LMOM for pt to return my call.  This is to notify her of her appt on 10/22 at [facility] radiology.  Appt info listed below.

## 2013-02-25 ENCOUNTER — Ambulatory Visit: Payer: Self-pay | Admitting: Advanced Practice Midwife

## 2013-02-26 ENCOUNTER — Encounter (INDEPENDENT_AMBULATORY_CARE_PROVIDER_SITE_OTHER): Payer: Self-pay

## 2013-02-26 ENCOUNTER — Ambulatory Visit (HOSPITAL_COMMUNITY)
Admission: RE | Admit: 2013-02-26 | Discharge: 2013-02-26 | Disposition: A | Discharge status: Home / Self Care | Payer: Medicare Other | Source: Ambulatory Visit | Attending: Internal Medicine | Admitting: Internal Medicine

## 2013-02-26 ENCOUNTER — Ambulatory Visit (HOSPITAL_COMMUNITY): Admission: RE | Admit: 2013-02-26 | Payer: Medicare Other | Source: Ambulatory Visit

## 2013-02-26 DIAGNOSIS — Z780 Asymptomatic menopausal state: Secondary | ICD-10-CM | POA: Diagnosis not present

## 2013-02-26 DIAGNOSIS — R9389 Abnormal findings on diagnostic imaging of other specified body structures: Secondary | ICD-10-CM | POA: Diagnosis not present

## 2013-02-26 DIAGNOSIS — N949 Unspecified condition associated with female genital organs and menstrual cycle: Secondary | ICD-10-CM | POA: Diagnosis not present

## 2013-02-26 DIAGNOSIS — R102 Pelvic and perineal pain: Secondary | ICD-10-CM

## 2013-02-26 NOTE — Telephone Encounter (Signed)
Message copied by Milas Hock on Tue Feb 26, 2013 11:59 AM ------      Message from: Fatima Sanger      Created: Tue Feb 26, 2013 11:37 AM       I have been trying to contact pt since 10/15 to give her appt information for NM parathyroid scan which was schedule for tomorrow 10/22.  However she has not returned my call so I am going to cancel this appt for tomorrow and if she calls back I will reschedule.  Just a FYI. Thank you. ------

## 2013-02-26 NOTE — Telephone Encounter (Signed)
LMOM for pt to return call.  I am going to call and cancel this appt for tomorrow, if pt calls back we will reschedule.

## 2013-02-27 ENCOUNTER — Encounter (HOSPITAL_COMMUNITY): Payer: Medicare Other

## 2013-02-27 ENCOUNTER — Telehealth (INDEPENDENT_AMBULATORY_CARE_PROVIDER_SITE_OTHER): Payer: Self-pay | Admitting: *Deleted

## 2013-02-27 NOTE — Telephone Encounter (Signed)
Pt called to return my calls and stated she didn't know we had been trying to reach her however, I have left 3 messages.  Pt no showed for her appt today so I called and rescheduled for next Friday 03/08/13 with an arrival time of 9:45am at [facility].  Pt agreed to this appt.

## 2013-03-01 DIAGNOSIS — I509 Heart failure, unspecified: Secondary | ICD-10-CM | POA: Diagnosis not present

## 2013-03-01 DIAGNOSIS — F319 Bipolar disorder, unspecified: Secondary | ICD-10-CM | POA: Diagnosis not present

## 2013-03-01 DIAGNOSIS — F17200 Nicotine dependence, unspecified, uncomplicated: Secondary | ICD-10-CM | POA: Diagnosis not present

## 2013-03-01 DIAGNOSIS — R55 Syncope and collapse: Secondary | ICD-10-CM | POA: Diagnosis not present

## 2013-03-01 DIAGNOSIS — R21 Rash and other nonspecific skin eruption: Secondary | ICD-10-CM | POA: Diagnosis not present

## 2013-03-01 DIAGNOSIS — N183 Chronic kidney disease, stage 3 unspecified: Secondary | ICD-10-CM | POA: Diagnosis not present

## 2013-03-01 DIAGNOSIS — J329 Chronic sinusitis, unspecified: Secondary | ICD-10-CM | POA: Diagnosis not present

## 2013-03-01 DIAGNOSIS — E538 Deficiency of other specified B group vitamins: Secondary | ICD-10-CM | POA: Diagnosis not present

## 2013-03-01 DIAGNOSIS — I10 Essential (primary) hypertension: Secondary | ICD-10-CM | POA: Diagnosis not present

## 2013-03-05 ENCOUNTER — Encounter: Payer: Self-pay | Admitting: Obstetrics

## 2013-03-08 ENCOUNTER — Encounter (HOSPITAL_COMMUNITY): Payer: Medicare Other

## 2013-03-13 ENCOUNTER — Ambulatory Visit: Payer: Medicaid Other | Admitting: Obstetrics

## 2013-03-14 DIAGNOSIS — D649 Anemia, unspecified: Secondary | ICD-10-CM | POA: Diagnosis not present

## 2013-03-14 DIAGNOSIS — N2581 Secondary hyperparathyroidism of renal origin: Secondary | ICD-10-CM | POA: Diagnosis not present

## 2013-03-14 DIAGNOSIS — D510 Vitamin B12 deficiency anemia due to intrinsic factor deficiency: Secondary | ICD-10-CM | POA: Diagnosis not present

## 2013-03-14 DIAGNOSIS — N184 Chronic kidney disease, stage 4 (severe): Secondary | ICD-10-CM | POA: Diagnosis not present

## 2013-03-20 ENCOUNTER — Other Ambulatory Visit (HOSPITAL_COMMUNITY): Payer: Self-pay

## 2013-03-21 ENCOUNTER — Encounter (HOSPITAL_COMMUNITY): Payer: Self-pay

## 2013-04-08 DIAGNOSIS — F319 Bipolar disorder, unspecified: Secondary | ICD-10-CM | POA: Diagnosis not present

## 2013-04-08 DIAGNOSIS — I509 Heart failure, unspecified: Secondary | ICD-10-CM | POA: Diagnosis not present

## 2013-04-08 DIAGNOSIS — J45909 Unspecified asthma, uncomplicated: Secondary | ICD-10-CM | POA: Diagnosis not present

## 2013-04-08 DIAGNOSIS — J3089 Other allergic rhinitis: Secondary | ICD-10-CM | POA: Diagnosis not present

## 2013-04-08 DIAGNOSIS — I10 Essential (primary) hypertension: Secondary | ICD-10-CM | POA: Diagnosis not present

## 2013-04-08 DIAGNOSIS — N183 Chronic kidney disease, stage 3 unspecified: Secondary | ICD-10-CM | POA: Diagnosis not present

## 2013-04-08 DIAGNOSIS — F17200 Nicotine dependence, unspecified, uncomplicated: Secondary | ICD-10-CM | POA: Diagnosis not present

## 2013-04-08 DIAGNOSIS — E538 Deficiency of other specified B group vitamins: Secondary | ICD-10-CM | POA: Diagnosis not present

## 2013-04-11 ENCOUNTER — Ambulatory Visit: Payer: Self-pay | Admitting: Obstetrics

## 2013-04-19 ENCOUNTER — Encounter (HOSPITAL_COMMUNITY)
Admission: RE | Admit: 2013-04-19 | Discharge: 2013-04-19 | Disposition: A | Discharge status: Home / Self Care | Payer: Medicare Other | Source: Ambulatory Visit | Attending: Nephrology | Admitting: Nephrology

## 2013-04-19 DIAGNOSIS — D638 Anemia in other chronic diseases classified elsewhere: Secondary | ICD-10-CM | POA: Diagnosis not present

## 2013-04-19 DIAGNOSIS — N184 Chronic kidney disease, stage 4 (severe): Secondary | ICD-10-CM | POA: Insufficient documentation

## 2013-04-19 LAB — POCT HEMOGLOBIN-HEMACUE: Hemoglobin: 11.2 g/dL — ABNORMAL LOW (ref 12.0–15.0)

## 2013-04-19 MED ORDER — EPOETIN ALFA 20000 UNIT/ML IJ SOLN
INTRAMUSCULAR | Status: AC
  Administered 2013-04-19: 20000 [IU] via SUBCUTANEOUS
  Filled 2013-04-19: qty 1

## 2013-04-19 MED ORDER — EPOETIN ALFA 20000 UNIT/ML IJ SOLN: 20000.0000 [IU] | INTRAMUSCULAR | Status: DC

## 2013-04-22 DIAGNOSIS — F319 Bipolar disorder, unspecified: Secondary | ICD-10-CM | POA: Diagnosis not present

## 2013-04-22 DIAGNOSIS — I509 Heart failure, unspecified: Secondary | ICD-10-CM | POA: Diagnosis not present

## 2013-04-22 DIAGNOSIS — F17200 Nicotine dependence, unspecified, uncomplicated: Secondary | ICD-10-CM | POA: Diagnosis not present

## 2013-04-22 DIAGNOSIS — J45909 Unspecified asthma, uncomplicated: Secondary | ICD-10-CM | POA: Diagnosis not present

## 2013-04-22 DIAGNOSIS — N183 Chronic kidney disease, stage 3 unspecified: Secondary | ICD-10-CM | POA: Diagnosis not present

## 2013-04-22 DIAGNOSIS — E538 Deficiency of other specified B group vitamins: Secondary | ICD-10-CM | POA: Diagnosis not present

## 2013-04-22 DIAGNOSIS — I10 Essential (primary) hypertension: Secondary | ICD-10-CM | POA: Diagnosis not present

## 2013-04-22 DIAGNOSIS — J3089 Other allergic rhinitis: Secondary | ICD-10-CM | POA: Diagnosis not present

## 2013-04-23 ENCOUNTER — Emergency Department (HOSPITAL_COMMUNITY): Payer: Medicare Other

## 2013-04-23 ENCOUNTER — Encounter (HOSPITAL_COMMUNITY): Payer: Self-pay | Admitting: Emergency Medicine

## 2013-04-23 ENCOUNTER — Inpatient Hospital Stay (HOSPITAL_COMMUNITY)
Admission: EM | Admit: 2013-04-23 | Discharge: 2013-04-26 | DRG: 291 | Disposition: A | Discharge status: Home / Self Care | Payer: Medicare Other | Attending: Internal Medicine | Admitting: Internal Medicine

## 2013-04-23 DIAGNOSIS — E875 Hyperkalemia: Secondary | ICD-10-CM | POA: Diagnosis not present

## 2013-04-23 DIAGNOSIS — E210 Primary hyperparathyroidism: Secondary | ICD-10-CM | POA: Diagnosis present

## 2013-04-23 DIAGNOSIS — N19 Unspecified kidney failure: Secondary | ICD-10-CM | POA: Diagnosis not present

## 2013-04-23 DIAGNOSIS — N184 Chronic kidney disease, stage 4 (severe): Secondary | ICD-10-CM | POA: Diagnosis present

## 2013-04-23 DIAGNOSIS — D631 Anemia in chronic kidney disease: Secondary | ICD-10-CM | POA: Diagnosis not present

## 2013-04-23 DIAGNOSIS — E119 Type 2 diabetes mellitus without complications: Secondary | ICD-10-CM | POA: Diagnosis present

## 2013-04-23 DIAGNOSIS — I10 Essential (primary) hypertension: Secondary | ICD-10-CM

## 2013-04-23 DIAGNOSIS — E860 Dehydration: Secondary | ICD-10-CM

## 2013-04-23 DIAGNOSIS — J45909 Unspecified asthma, uncomplicated: Secondary | ICD-10-CM | POA: Diagnosis not present

## 2013-04-23 DIAGNOSIS — F319 Bipolar disorder, unspecified: Secondary | ICD-10-CM | POA: Diagnosis not present

## 2013-04-23 DIAGNOSIS — J441 Chronic obstructive pulmonary disease with (acute) exacerbation: Secondary | ICD-10-CM | POA: Diagnosis not present

## 2013-04-23 DIAGNOSIS — I129 Hypertensive chronic kidney disease with stage 1 through stage 4 chronic kidney disease, or unspecified chronic kidney disease: Secondary | ICD-10-CM | POA: Diagnosis present

## 2013-04-23 DIAGNOSIS — R000 Tachycardia, unspecified: Secondary | ICD-10-CM

## 2013-04-23 DIAGNOSIS — N179 Acute kidney failure, unspecified: Secondary | ICD-10-CM | POA: Diagnosis not present

## 2013-04-23 DIAGNOSIS — J9600 Acute respiratory failure, unspecified whether with hypoxia or hypercapnia: Secondary | ICD-10-CM | POA: Diagnosis present

## 2013-04-23 DIAGNOSIS — J189 Pneumonia, unspecified organism: Secondary | ICD-10-CM | POA: Diagnosis present

## 2013-04-23 DIAGNOSIS — I5033 Acute on chronic diastolic (congestive) heart failure: Secondary | ICD-10-CM | POA: Diagnosis not present

## 2013-04-23 DIAGNOSIS — R0602 Shortness of breath: Secondary | ICD-10-CM

## 2013-04-23 DIAGNOSIS — R112 Nausea with vomiting, unspecified: Secondary | ICD-10-CM

## 2013-04-23 DIAGNOSIS — N183 Chronic kidney disease, stage 3 unspecified: Secondary | ICD-10-CM | POA: Diagnosis not present

## 2013-04-23 DIAGNOSIS — R062 Wheezing: Secondary | ICD-10-CM | POA: Diagnosis present

## 2013-04-23 DIAGNOSIS — F17200 Nicotine dependence, unspecified, uncomplicated: Secondary | ICD-10-CM | POA: Diagnosis present

## 2013-04-23 DIAGNOSIS — D638 Anemia in other chronic diseases classified elsewhere: Secondary | ICD-10-CM | POA: Diagnosis present

## 2013-04-23 DIAGNOSIS — I509 Heart failure, unspecified: Secondary | ICD-10-CM | POA: Diagnosis not present

## 2013-04-23 DIAGNOSIS — J3089 Other allergic rhinitis: Secondary | ICD-10-CM | POA: Diagnosis not present

## 2013-04-23 DIAGNOSIS — N189 Chronic kidney disease, unspecified: Secondary | ICD-10-CM

## 2013-04-23 DIAGNOSIS — R609 Edema, unspecified: Secondary | ICD-10-CM | POA: Diagnosis present

## 2013-04-23 DIAGNOSIS — E785 Hyperlipidemia, unspecified: Secondary | ICD-10-CM | POA: Diagnosis not present

## 2013-04-23 DIAGNOSIS — R400 Somnolence: Secondary | ICD-10-CM

## 2013-04-23 DIAGNOSIS — R1033 Periumbilical pain: Secondary | ICD-10-CM | POA: Diagnosis not present

## 2013-04-23 DIAGNOSIS — Z01810 Encounter for preprocedural cardiovascular examination: Secondary | ICD-10-CM | POA: Diagnosis not present

## 2013-04-23 LAB — CBC
HCT: 33.4 % — ABNORMAL LOW (ref 36.0–46.0)
Hemoglobin: 10.4 g/dL — ABNORMAL LOW (ref 12.0–15.0)
MCH: 30.6 pg (ref 26.0–34.0)
MCHC: 31.1 g/dL (ref 30.0–36.0)
MCV: 98.2 fL (ref 78.0–100.0)
Platelets: 220 10*3/uL (ref 150–400)
RBC: 3.4 MIL/uL — ABNORMAL LOW (ref 3.87–5.11)
RDW: 17.2 % — ABNORMAL HIGH (ref 11.5–15.5)
WBC: 11.9 10*3/uL — ABNORMAL HIGH (ref 4.0–10.5)

## 2013-04-23 LAB — URINALYSIS, ROUTINE W REFLEX MICROSCOPIC
Bilirubin Urine: NEGATIVE
Glucose, UA: NEGATIVE mg/dL
Hgb urine dipstick: NEGATIVE
Ketones, ur: NEGATIVE mg/dL
Leukocytes, UA: NEGATIVE
Nitrite: NEGATIVE
Protein, ur: 100 mg/dL — AB
Specific Gravity, Urine: 1.007 (ref 1.005–1.030)
Urobilinogen, UA: 0.2 mg/dL (ref 0.0–1.0)
pH: 7 (ref 5.0–8.0)

## 2013-04-23 LAB — BASIC METABOLIC PANEL
BUN: 37 mg/dL — ABNORMAL HIGH (ref 6–23)
CO2: 28 mEq/L (ref 19–32)
Calcium: 11.2 mg/dL — ABNORMAL HIGH (ref 8.4–10.5)
Chloride: 101 mEq/L (ref 96–112)
Creatinine, Ser: 3.07 mg/dL — ABNORMAL HIGH (ref 0.50–1.10)
GFR calc Af Amer: 19 mL/min — ABNORMAL LOW (ref >90)
GFR calc non Af Amer: 16 mL/min — ABNORMAL LOW (ref >90)
Glucose, Bld: 116 mg/dL — ABNORMAL HIGH (ref 70–99)
Potassium: 6.1 mEq/L — ABNORMAL HIGH (ref 3.5–5.1)
Sodium: 136 mEq/L (ref 135–145)

## 2013-04-23 LAB — PHOSPHORUS
Phosphorus: 4.2 mg/dL (ref 2.3–4.6)
Phosphorus: 4.9 mg/dL — ABNORMAL HIGH (ref 2.3–4.6)

## 2013-04-23 LAB — IRON AND TIBC
Iron: 13 ug/dL — ABNORMAL LOW (ref 42–135)
Saturation Ratios: 4 % — ABNORMAL LOW (ref 20–55)
TIBC: 300 ug/dL (ref 250–470)
UIBC: 287 ug/dL (ref 125–400)

## 2013-04-23 LAB — URINE MICROSCOPIC-ADD ON

## 2013-04-23 LAB — CREATININE, URINE, RANDOM: Creatinine, Urine: 32.73 mg/dL

## 2013-04-23 LAB — MAGNESIUM: Magnesium: 2.6 mg/dL — ABNORMAL HIGH (ref 1.5–2.5)

## 2013-04-23 LAB — SODIUM, URINE, RANDOM: Sodium, Ur: 57 mEq/L

## 2013-04-23 LAB — POCT I-STAT TROPONIN I: Troponin i, poc: 0 ng/mL (ref 0.00–0.08)

## 2013-04-23 LAB — STREP PNEUMONIAE URINARY ANTIGEN: Strep Pneumo Urinary Antigen: NEGATIVE

## 2013-04-23 LAB — POTASSIUM: Potassium: 6.4 mEq/L (ref 3.5–5.1)

## 2013-04-23 LAB — PRO B NATRIURETIC PEPTIDE: Pro B Natriuretic peptide (BNP): 7094 pg/mL — ABNORMAL HIGH (ref 0–125)

## 2013-04-23 MED ORDER — ALBUTEROL (5 MG/ML) CONTINUOUS INHALATION SOLN
5.0000 mg/h | INHALATION_SOLUTION | Freq: Once | RESPIRATORY_TRACT | Status: AC
  Administered 2013-04-23: 5 mg/h via RESPIRATORY_TRACT
  Filled 2013-04-23: qty 20

## 2013-04-23 MED ORDER — SODIUM POLYSTYRENE SULFONATE 15 GM/60ML PO SUSP
15.0000 g | Freq: Once | ORAL | Status: AC
  Administered 2013-04-23: 15 g via ORAL
  Filled 2013-04-23: qty 60

## 2013-04-23 MED ORDER — ALBUTEROL SULFATE (5 MG/ML) 0.5% IN NEBU: 2.5000 mg | INHALATION_SOLUTION | RESPIRATORY_TRACT | Status: DC | PRN

## 2013-04-23 MED ORDER — GABAPENTIN 400 MG PO CAPS
1200.0000 mg | ORAL_CAPSULE | Freq: Every day | ORAL | Status: DC
  Administered 2013-04-23 – 2013-04-25 (×3): 1200 mg via ORAL
  Filled 2013-04-23 (×4): qty 3

## 2013-04-23 MED ORDER — SODIUM CHLORIDE 0.9 % IV BOLUS (SEPSIS)
500.0000 mL | Freq: Once | INTRAVENOUS | Status: AC
  Administered 2013-04-23: 500 mL via INTRAVENOUS

## 2013-04-23 MED ORDER — PANTOPRAZOLE SODIUM 40 MG PO TBEC
40.0000 mg | DELAYED_RELEASE_TABLET | Freq: Every day | ORAL | Status: DC
  Administered 2013-04-24 – 2013-04-26 (×3): 40 mg via ORAL
  Filled 2013-04-23 (×3): qty 1

## 2013-04-23 MED ORDER — SODIUM CHLORIDE 0.9 % IV SOLN
250.0000 mL | INTRAVENOUS | Status: DC | PRN
  Administered 2013-04-23: 250 mL via INTRAVENOUS

## 2013-04-23 MED ORDER — DIVALPROEX SODIUM 500 MG PO DR TAB
1000.0000 mg | DELAYED_RELEASE_TABLET | Freq: Every day | ORAL | Status: DC
  Administered 2013-04-23 – 2013-04-25 (×3): 1000 mg via ORAL
  Filled 2013-04-23 (×4): qty 2

## 2013-04-23 MED ORDER — ZIPRASIDONE HCL 60 MG PO CAPS
120.0000 mg | ORAL_CAPSULE | Freq: Every day | ORAL | Status: DC
  Administered 2013-04-23 – 2013-04-25 (×3): 120 mg via ORAL
  Filled 2013-04-23 (×4): qty 2

## 2013-04-23 MED ORDER — FUROSEMIDE 10 MG/ML IJ SOLN
80.0000 mg | Freq: Once | INTRAMUSCULAR | Status: AC
  Administered 2013-04-23: 80 mg via INTRAVENOUS
  Filled 2013-04-23: qty 8

## 2013-04-23 MED ORDER — SIMVASTATIN 20 MG PO TABS
20.0000 mg | ORAL_TABLET | Freq: Every evening | ORAL | Status: DC
  Administered 2013-04-23 – 2013-04-25 (×3): 20 mg via ORAL
  Filled 2013-04-23 (×4): qty 1

## 2013-04-23 MED ORDER — ALBUTEROL SULFATE (5 MG/ML) 0.5% IN NEBU
5.0000 mg | INHALATION_SOLUTION | Freq: Once | RESPIRATORY_TRACT | Status: AC
  Administered 2013-04-23: 5 mg via RESPIRATORY_TRACT
  Filled 2013-04-23: qty 1

## 2013-04-23 MED ORDER — ONDANSETRON HCL 4 MG PO TABS: 4.0000 mg | ORAL_TABLET | Freq: Four times a day (QID) | ORAL | Status: DC | PRN

## 2013-04-23 MED ORDER — PNEUMOCOCCAL VAC POLYVALENT 25 MCG/0.5ML IJ INJ
0.5000 mL | INJECTION | INTRAMUSCULAR | Status: AC
  Administered 2013-04-24: 0.5 mL via INTRAMUSCULAR
  Filled 2013-04-23 (×2): qty 0.5

## 2013-04-23 MED ORDER — FUROSEMIDE 80 MG PO TABS
160.0000 mg | ORAL_TABLET | Freq: Every day | ORAL | Status: DC
  Administered 2013-04-24: 160 mg via ORAL
  Filled 2013-04-23: qty 2

## 2013-04-23 MED ORDER — FUROSEMIDE 10 MG/ML IJ SOLN
80.0000 mg | Freq: Two times a day (BID) | INTRAMUSCULAR | Status: DC
  Filled 2013-04-23: qty 8

## 2013-04-23 MED ORDER — DEXTROSE 50 % IV SOLN
1.0000 | Freq: Once | INTRAVENOUS | Status: AC
  Administered 2013-04-23: 50 mL via INTRAVENOUS
  Filled 2013-04-23: qty 50

## 2013-04-23 MED ORDER — DEXTROSE 5 % IV SOLN
1.0000 g | Freq: Once | INTRAVENOUS | Status: AC
  Administered 2013-04-23: 1 g via INTRAVENOUS
  Filled 2013-04-23: qty 10

## 2013-04-23 MED ORDER — SODIUM CHLORIDE 0.9 % IJ SOLN: 3.0000 mL | INTRAMUSCULAR | Status: DC | PRN

## 2013-04-23 MED ORDER — METOPROLOL SUCCINATE ER 100 MG PO TB24
100.0000 mg | ORAL_TABLET | Freq: Every day | ORAL | Status: DC
  Administered 2013-04-23 – 2013-04-25 (×3): 100 mg via ORAL
  Filled 2013-04-23 (×4): qty 1

## 2013-04-23 MED ORDER — SODIUM CHLORIDE 0.9 % IJ SOLN
3.0000 mL | Freq: Two times a day (BID) | INTRAMUSCULAR | Status: DC
  Administered 2013-04-24 – 2013-04-26 (×2): 3 mL via INTRAVENOUS

## 2013-04-23 MED ORDER — ALBUTEROL SULFATE (5 MG/ML) 0.5% IN NEBU
2.5000 mg | INHALATION_SOLUTION | RESPIRATORY_TRACT | Status: DC
  Administered 2013-04-23 – 2013-04-24 (×7): 2.5 mg via RESPIRATORY_TRACT
  Filled 2013-04-23 (×7): qty 0.5

## 2013-04-23 MED ORDER — AMLODIPINE BESYLATE 10 MG PO TABS
10.0000 mg | ORAL_TABLET | Freq: Every day | ORAL | Status: DC
  Administered 2013-04-23: 10 mg via ORAL
  Filled 2013-04-23 (×2): qty 1

## 2013-04-23 MED ORDER — DEXTROSE 5 % IV SOLN
500.0000 mg | INTRAVENOUS | Status: AC
  Administered 2013-04-24 – 2013-04-25 (×2): 500 mg via INTRAVENOUS
  Filled 2013-04-23 (×2): qty 500

## 2013-04-23 MED ORDER — MOMETASONE FURO-FORMOTEROL FUM 100-5 MCG/ACT IN AERO
2.0000 | INHALATION_SPRAY | Freq: Two times a day (BID) | RESPIRATORY_TRACT | Status: DC
  Administered 2013-04-23 – 2013-04-26 (×5): 2 via RESPIRATORY_TRACT
  Filled 2013-04-23: qty 8.8

## 2013-04-23 MED ORDER — DEXTROSE 5 % IV SOLN
500.0000 mg | Freq: Once | INTRAVENOUS | Status: AC
  Administered 2013-04-23: 500 mg via INTRAVENOUS

## 2013-04-23 MED ORDER — NICOTINE 14 MG/24HR TD PT24
14.0000 mg | MEDICATED_PATCH | Freq: Every day | TRANSDERMAL | Status: DC
  Administered 2013-04-23 – 2013-04-26 (×4): 14 mg via TRANSDERMAL
  Filled 2013-04-23 (×4): qty 1

## 2013-04-23 MED ORDER — INSULIN ASPART 100 UNIT/ML ~~LOC~~ SOLN
6.0000 [IU] | Freq: Once | SUBCUTANEOUS | Status: AC
  Administered 2013-04-23: 6 [IU] via INTRAVENOUS
  Filled 2013-04-23: qty 1

## 2013-04-23 MED ORDER — SODIUM CHLORIDE 0.9 % IJ SOLN
3.0000 mL | Freq: Two times a day (BID) | INTRAMUSCULAR | Status: DC
  Administered 2013-04-24 – 2013-04-25 (×2): 3 mL via INTRAVENOUS

## 2013-04-23 MED ORDER — FLUTICASONE PROPIONATE 50 MCG/ACT NA SUSP
2.0000 | Freq: Every day | NASAL | Status: DC
  Administered 2013-04-24 – 2013-04-26 (×3): 2 via NASAL
  Filled 2013-04-23: qty 16

## 2013-04-23 MED ORDER — SODIUM POLYSTYRENE SULFONATE 15 GM/60ML PO SUSP
30.0000 g | Freq: Once | ORAL | Status: AC
  Administered 2013-04-23: 30 g via ORAL
  Filled 2013-04-23: qty 120

## 2013-04-23 MED ORDER — DEXTROSE 5 % IV SOLN
1.0000 g | INTRAVENOUS | Status: DC
  Administered 2013-04-24 – 2013-04-25 (×2): 1 g via INTRAVENOUS
  Filled 2013-04-23 (×3): qty 10

## 2013-04-23 MED ORDER — ONDANSETRON HCL 4 MG/2ML IJ SOLN: 4.0000 mg | Freq: Four times a day (QID) | INTRAMUSCULAR | Status: DC | PRN

## 2013-04-23 MED ORDER — SODIUM BICARBONATE 8.4 % IV SOLN
50.0000 meq | Freq: Once | INTRAVENOUS | Status: AC
  Administered 2013-04-23: 50 meq via INTRAVENOUS
  Filled 2013-04-23: qty 50

## 2013-04-23 NOTE — ED Notes (Signed)
Patient transported to X-ray 

## 2013-04-23 NOTE — Progress Notes (Signed)
PHARMACY - ROCEPHIN  Pharmacy consulted to dose Rocephin for r/o respiratory infection. Patient received Rocephin 1gm IV x 1 in ED @ 16:39 today Patient also on Azithromycin 500mg  IV q24h.  Temp = AFebrile WBC = 11.9 Scr = 3.07 with CrCl ~ 23 ml/min  Rocephin requires no renal dosage adjustment  PLAN:  Rocephin 1gm IV q24h             No further follow up necessary therefore will sign off  Thank you for the consult, Terrilee Files, PharmD

## 2013-04-23 NOTE — ED Notes (Addendum)
Per EMS: Pt reports shortness of breath which has been occuring over the last couple of days. Pt was at PCP office earlier today and was given 5 mg of albuterol nebulized, which she stated that it helped somewhat. Pt reports difficulty holding things in both hands. PT reports having a productive cough for several weeks. Pt is A/O x4 and in NAD.

## 2013-04-23 NOTE — Progress Notes (Signed)
   CARE MANAGEMENT ED NOTE 04/23/2013  Patient:  Jennifer Lawson, Jennifer Lawson   Account Number:  [id number]  Date Initiated:  04/23/2013  Documentation initiated by:  Radford Pax  Subjective/Objective Assessment:   Patient presenst to Ed with productive cough, shortness of breath and difficulty holding things in both hands.     Subjective/Objective Assessment Detail:   Patient is currently living in a hotel.     Action/Plan:   Action/Plan Detail:   Anticipated DC Date:       Status Recommendation to Physician:   Result of Recommendation:    Other ED Services  Consult Working Plan    DC Planning Services  CM consult  Other    Choice offered to / List presented to:            Status of service:  Completed, signed off  ED Comments:   ED Comments Detail:  [facility] consulted to speak to patient regarding food stamps and eye glasses.  EDCM spoke to patient at bedside.  [facility] provided patient with the phone number to the Department of Social Services for food stamps.  EDCM also provided patient with a list of eye specialists within a ten mile radius of patient's zip code who accept Medicare insurance. Also provided patient with list of financial assistance in the community such as local churches and [employer].  Instructed patient that she needs to get an eye exam before she gets eye glasses.  EDCM explained to patient that her pcp may also be able to refer her to an eye specialist.

## 2013-04-23 NOTE — ED Provider Notes (Signed)
CSN: [id number]     Arrival date & time 04/23/13  1257 History   First MD Initiated Contact with Patient 04/23/13 1458     Chief Complaint  Patient presents with  . Shortness of Breath   (Consider location/radiation/quality/duration/timing/severity/associated sxs/prior Treatment) HPI  This is a 52 year old female with history of depression, bipolar disorder, hypertension, diabetes, and chronic kidney disease who presents with shortness of breath. Patient reports several week history of worsening shortness of breath. It is gotten worse over the last several days. She reports a dry cough. No documented fevers but endorses chills. Patient was seen by her primary care physician and given a nebulizer today. She states that helped a little bit. She also took prednisone earlier today. Per EMS, patient was noted to have oxygen saturations in the low 80's.  She's currently on 3 L nasal cannula. She's not normally on oxygen.  Patient denies any chest pain. She has chronic kidney disease and is followed by Dr. Lowell Guitar. She's not currently on dialysis.  Past Medical History  Diagnosis Date  . Mental disorder   . Depression   . Hypertension   . Diabetes mellitus without complication   . Overdose   . Tobacco use disorder 11/13/2012   Past Surgical History  Procedure Laterality Date  . Right knee replacement      she says it was last year.  . Esophagogastroduodenoscopy Left 11/14/2012    Procedure: ESOPHAGOGASTRODUODENOSCOPY (EGD);  Surgeon: Charna Elizabeth, MD;  Location: [facility];  Service: Endoscopy;  Laterality: Left;   History reviewed. No pertinent family history. History  Substance Use Topics  . Smoking status: Current Every Day Smoker -- 0.50 packs/day for 40 years  . Smokeless tobacco: Never Used  . Alcohol Use: Yes     Comment: social   OB History   Grav Para Term Preterm Abortions TAB SAB Ect Mult Living                 Review of Systems  Constitutional: Positive for chills.  Negative for fever.  Respiratory: Positive for shortness of breath and wheezing. Negative for cough and chest tightness.   Cardiovascular: Positive for leg swelling. Negative for chest pain.  Gastrointestinal: Negative for nausea, vomiting and abdominal pain.  Genitourinary: Negative for dysuria.  Musculoskeletal: Negative for back pain.  Skin: Negative for wound.  Neurological: Negative for headaches.  Psychiatric/Behavioral: Negative for confusion.  All other systems reviewed and are negative.    Allergies  Codeine sulfate; Haldol; and Risperidone and related  Home Medications   Current Outpatient Rx  Name  Route  Sig  Dispense  Refill  . albuterol (PROVENTIL HFA;VENTOLIN HFA) 108 (90 BASE) MCG/ACT inhaler   Inhalation   Inhale 1 puff into the lungs every 6 (six) hours as needed for wheezing or shortness of breath.         Marland Kitchen amLODipine (NORVASC) 10 MG tablet   Oral   Take 10 mg by mouth at bedtime.          . Aspirin-Caffeine (BC FAST PAIN RELIEF PO)   Oral   Take 1 packet by mouth as needed (headache).         . divalproex (DEPAKOTE) 500 MG DR tablet   Oral   Take 1,000 mg by mouth at bedtime.          . fluticasone (FLONASE) 50 MCG/ACT nasal spray   Each Nare   Place 2 sprays into both nostrils daily.         Marland Kitchen  Fluticasone-Salmeterol (ADVAIR) 100-50 MCG/DOSE AEPB   Inhalation   Inhale 1 puff into the lungs 2 (two) times daily.         . furosemide (LASIX) 80 MG tablet   Oral   Take 160 mg by mouth daily.         Marland Kitchen gabapentin (NEURONTIN) 600 MG tablet   Oral   Take 1,200 mg by mouth at bedtime.         . metoprolol succinate (TOPROL-XL) 100 MG 24 hr tablet   Oral   Take 100 mg by mouth at bedtime. Take with or immediately following a meal.         . Multiple Vitamins-Minerals (CENTRUM SILVER PO)   Oral   Take 1 tablet by mouth daily.         Marland Kitchen omeprazole (PRILOSEC) 20 MG capsule   Oral   Take 20 mg by mouth daily.          .  predniSONE (DELTASONE) 50 MG tablet   Oral   Take 50 mg by mouth daily with breakfast. X 5 days, started 04/23/13         . simvastatin (ZOCOR) 20 MG tablet   Oral   Take 20 mg by mouth every evening.         . ziprasidone (GEODON) 60 MG capsule   Oral   Take 120 mg by mouth at bedtime.          BP 142/87  Pulse 88  Temp(Src) 98.6 F (37 C) (Oral)  Resp 18  SpO2 97% Physical Exam  Nursing note and vitals reviewed. Constitutional: She is oriented to person, place, and time.  Chronically ill-appearing, no acute distress, nasal cannula in place  HENT:  Head: Normocephalic and atraumatic.  Mucous membranes dry, edentulous  Eyes: Pupils are equal, round, and reactive to light.  Neck: Neck supple.  Cardiovascular: Normal rate, regular rhythm and normal heart sounds.   Pulmonary/Chest: Effort normal. No respiratory distress. She has wheezes.  Decreased breath sounds bilateral lower lobes with fine crackles  Abdominal: Soft. Bowel sounds are normal. There is no tenderness. There is no rebound.  Musculoskeletal:  1+ bilateral lower extremity edema to the knees  Neurological: She is alert and oriented to person, place, and time.  Skin: Skin is warm and dry.  Psychiatric: She has a normal mood and affect.    ED Course  Procedures (including critical care time) Labs Review Labs Reviewed  BASIC METABOLIC PANEL - Abnormal; Notable for the following:    Potassium 6.1 (*)    Glucose, Bld 116 (*)    BUN 37 (*)    Creatinine, Ser 3.07 (*)    Calcium 11.2 (*)    GFR calc non Af Amer 16 (*)    GFR calc Af Amer 19 (*)    All other components within normal limits  CBC - Abnormal; Notable for the following:    WBC 11.9 (*)    RBC 3.40 (*)    Hemoglobin 10.4 (*)    HCT 33.4 (*)    RDW 17.2 (*)    All other components within normal limits  POTASSIUM - Abnormal; Notable for the following:    Potassium 6.4 (*)    All other components within normal limits  PRO B NATRIURETIC  PEPTIDE - Abnormal; Notable for the following:    Pro B Natriuretic peptide (BNP) 7094.0 (*)    All other components within normal limits  CULTURE, BLOOD (ROUTINE X 2)  CULTURE, BLOOD (ROUTINE X 2)  POCT I-STAT TROPONIN I   Imaging Review Dg Chest 2 View (if Patient Has Fever And/or Copd)  04/23/2013   CLINICAL DATA:  Shortness of breath.  EXAM: CHEST  2 VIEW  COMPARISON:  01/17/2013.  FINDINGS: Low lung volumes. Cardiomegaly. Bilateral pulmonary opacities representing either early congestive failure or bilateral pneumonia. Negative osseous structures. Worsening aeration from priors.  IMPRESSION: Cardiomegaly with bilateral pulmonary opacities are new/increased from priors. Question bilateral pneumonia versus failure.   Electronically Signed   By: Davonna Belling M.D.   On: 04/23/2013 14:16    EKG Interpretation    Date/Time:  Tuesday April 23 2013 14:52:38 EST Ventricular Rate:  89 PR Interval:  189 QRS Duration: 95 QT Interval:  343 QTC Calculation: 417 R Axis:   28 Text Interpretation:  Sinus rhythm Probable left atrial enlargement Probable anteroseptal infarct, old Confirmed by HORTON  MD, COURTNEY ([id number]) on 04/23/2013 3:38:48 PM           CRITICAL CARE Performed by: Ross Marcus, F   Total critical care time: 35 min  Critical care time was exclusive of separately billable procedures and treating other patients.  Critical care was necessary to treat or prevent imminent or life-threatening deterioration.  Critical care was time spent personally by me on the following activities: development of treatment plan with patient and/or surrogate as well as nursing, discussions with consultants, evaluation of patient's response to treatment, examination of patient, obtaining history from patient or surrogate, ordering and performing treatments and interventions, ordering and review of laboratory studies, ordering and review of radiographic studies, pulse oximetry and  re-evaluation of patient's condition.  MDM   1. Hyperkalemia   2. Shortness of breath   3. Community acquired pneumonia     Patient presents with progressive shortness of breath. She is requiring oxygen but is in no acute distress. She does appear to be somewhat volume overloaded.  Lab work is notable for a potassium of 6.1 without evidence of hemolysis. The patient was given albuterol, insulin/glucose. Repeat potassium was sent. Pro BNP is also elevated at 7000. Patient has a white count of 11.9. Chest x-ray shows evidence of bilateral pulmonary opacities concerning for pneumonia versus heart failure.  Given patient's chills and cough, we'll treat her with azithromycin and ceftriaxone. Repeat potassium confirms IK. There are no EKG changes. Patient was given excellent. I discussed the patient with Dr. Arrie Aran she requests that I give the patient's 80 mg IV Lasix at this time. Patient will be admitted for further management.    Shon Baton, MD 04/23/13 2233

## 2013-04-23 NOTE — Consult Note (Signed)
Reason for Consult:AKI/CKD and hyperkalemia Referring Physician: [name] is an 52 y.o. female.  HPI: Pt is a 52yo AAF with PMH sig for bipolar disorder, HTN, primary hyperparathyroidism, and CKD stage 4 (secondary to lithium toxicity and NSAID's with baseline Scr of 2.5-2.9) who presented to [facility] with 2 week h/o worsening SOB.  She also reports having a cough prod of yellow sputum as well as fevers and chills.  She has had decreased energy and appetite over the last several days and admits to "forgetting" to take her fluid pills.    Upon evaluation in the ED, she was noted to have an elevated Scr of 3.07, hypercalcemia (calcium of 11) and hyperkalemia (K of 6.4).  We were consulted to further evaluate and manage her electrolyte abnormalities.  Her CXR was also suspicious for PNA.  Her trend in Scr is seen below:   Trend in Creatinine: Creatinine, Ser  Date/Time Value Range Status  04/23/2013  1:20 PM 3.07* 0.50 - 1.10 mg/dL Final  1/61/0960  4:54 AM 2.81* 0.50 - 1.10 mg/dL Final  0/98/1191  4:78 AM 2.63* 0.50 - 1.10 mg/dL Final  2/95/6213  0:86 AM 2.53* 0.50 - 1.10 mg/dL Final  5/78/4696  2:95 AM 2.46* 0.50 - 1.10 mg/dL Final  2/84/1324  4:01 AM 2.61* 0.50 - 1.10 mg/dL Final  0/27/2536  6:44 AM 3.34* 0.50 - 1.10 mg/dL Final  0/34/7425 95:63 PM 3.38* 0.50 - 1.10 mg/dL Final  8/75/6433  2:95 AM 2.72* 0.50 - 1.10 mg/dL Final  05/16/8414  6:06 AM 2.78* 0.50 - 1.10 mg/dL Final  3/0/1601  0:93 AM 3.12* 0.50 - 1.10 mg/dL Final  06/12/5571  2:20 AM 2.87* 0.50 - 1.10 mg/dL Final  06/13/4268 62:37 AM 2.86* 0.50 - 1.10 mg/dL Final  10/08/8313  1:76 AM 2.93* 0.50 - 1.10 mg/dL Final  05/15/735  1:06 PM 3.22* 0.50 - 1.10 mg/dL Final  06/15/9483 46:27 AM 3.21* 0.50 - 1.10 mg/dL Final  0/35/0093  8:18 AM 2.78* 0.50 - 1.10 mg/dL Final  2/99/3716  9:67 AM 2.78* 0.50 - 1.10 mg/dL Final  8/93/8101  7:51 PM 2.84* 0.50 - 1.10 mg/dL Final  0/25/8527 78:24 PM 2.70* 0.50 - 1.10 mg/dL Final   2/35/3614  4:31 AM 2.52* 0.50 - 1.10 mg/dL Final  5/40/0867 61:95 AM 2.60* 0.50 - 1.10 mg/dL Final  0/93/2671  2:45 PM 2.70* 0.50 - 1.10 mg/dL Final  80/99/8338  2:50 AM 2.23* 0.50 - 1.10 mg/dL Final  53/97/6734  1:93 AM 2.35* 0.50 - 1.10 mg/dL Final  79/06/4095  3:53 AM 2.80* 0.50 - 1.10 mg/dL Final  29/92/4268 34:19 AM 3.03* 0.50 - 1.10 mg/dL Final  10/28/2977  8:92 PM 2.61* 0.50 - 1.10 mg/dL Final  05/27/4172  0:81 PM 2.22* 0.50 - 1.10 mg/dL Final    PMH:   Past Medical History  Diagnosis Date  . Mental disorder   . Depression   . Hypertension   . Diabetes mellitus without complication   . Overdose   . Tobacco use disorder 11/13/2012    PSH:   Past Surgical History  Procedure Laterality Date  . Right knee replacement      she says it was last year.  . Esophagogastroduodenoscopy Left 11/14/2012    Procedure: ESOPHAGOGASTRODUODENOSCOPY (EGD);  Surgeon: Charna Elizabeth, MD;  Location: [facility];  Service: Endoscopy;  Laterality: Left;    Allergies:  Allergies  Allergen Reactions  . Codeine Sulfate Anaphylaxis    Daughter called about having this allergy   .  Haldol [Haloperidol Lactate] Shortness Of Breath  . Risperidone And Related Shortness Of Breath    Medications:   Prior to Admission medications   Medication Sig Start Date End Date Taking? Authorizing Provider  albuterol (PROVENTIL HFA;VENTOLIN HFA) 108 (90 BASE) MCG/ACT inhaler Inhale 1 puff into the lungs every 6 (six) hours as needed for wheezing or shortness of breath.   Yes Historical Provider, MD  amLODipine (NORVASC) 10 MG tablet Take 10 mg by mouth at bedtime.  02/09/13  Yes Historical Provider, MD  Aspirin-Caffeine (BC FAST PAIN RELIEF PO) Take 1 packet by mouth as needed (headache).   Yes Historical Provider, MD  divalproex (DEPAKOTE) 500 MG DR tablet Take 1,000 mg by mouth at bedtime.    Yes Historical Provider, MD  fluticasone (FLONASE) 50 MCG/ACT nasal spray Place 2 sprays into both nostrils daily.   Yes  Historical Provider, MD  Fluticasone-Salmeterol (ADVAIR) 100-50 MCG/DOSE AEPB Inhale 1 puff into the lungs 2 (two) times daily.   Yes Historical Provider, MD  furosemide (LASIX) 80 MG tablet Take 160 mg by mouth daily.   Yes Historical Provider, MD  gabapentin (NEURONTIN) 600 MG tablet Take 1,200 mg by mouth at bedtime.   Yes Historical Provider, MD  metoprolol succinate (TOPROL-XL) 100 MG 24 hr tablet Take 100 mg by mouth at bedtime. Take with or immediately following a meal.   Yes Historical Provider, MD  Multiple Vitamins-Minerals (CENTRUM SILVER PO) Take 1 tablet by mouth daily.   Yes Historical Provider, MD  omeprazole (PRILOSEC) 20 MG capsule Take 20 mg by mouth daily.  02/18/13  Yes Historical Provider, MD  predniSONE (DELTASONE) 50 MG tablet Take 50 mg by mouth daily with breakfast. X 5 days, started 04/23/13   Yes Historical Provider, MD  simvastatin (ZOCOR) 20 MG tablet Take 20 mg by mouth every evening.   Yes Historical Provider, MD  ziprasidone (GEODON) 60 MG capsule Take 120 mg by mouth at bedtime.   Yes Historical Provider, MD    Inpatient medications: . [START ON 04/24/2013] pneumococcal 23 valent vaccine  0.5 mL Intramuscular Tomorrow-1000    Discontinued Meds:   Medications Discontinued During This Encounter  Medication Reason  . hydrALAZINE (APRESOLINE) 50 MG tablet Change in therapy  . pantoprazole (PROTONIX) 40 MG tablet Change in therapy    Social History:  reports that she has been smoking.  She has never used smokeless tobacco. She reports that she drinks alcohol. She reports that she uses illicit drugs.  Family History:  History reviewed. No pertinent family history.  Pertinent items are noted in HPI. Weight change:  No intake or output data in the 24 hours ending 04/23/13 1717 BP 142/87  Pulse 88  Temp(Src) 98.6 F (37 C) (Oral)  Resp 18  SpO2 97% Filed Vitals:   04/23/13 1258 04/23/13 1318  BP:  142/87  Pulse:  88  Temp:  98.6 F (37 C)  TempSrc:   Oral  Resp:  18  SpO2: 94% 97%     General appearance: fatigued, no distress and slowed mentation Head: Normocephalic, without obvious abnormality, atraumatic Eyes: negative findings: lids and lashes normal, conjunctivae and sclerae normal and corneas clear Neck: no adenopathy, no carotid bruit, supple, symmetrical, trachea midline, thyroid not enlarged, symmetric, no tenderness/mass/nodules and +JVD  Resp: rhonchi bilaterally and wheezes posterior - bilateral Cardio: regular rate and rhythm, S1, S2 normal, no murmur, click, rub or gallop GI: soft, non-tender; bowel sounds normal; no masses,  no organomegaly Extremities: edema 1+ pitting pretibial  Neurologic: Mental status: Alert, oriented, thought content appropriate, but lethargic  Labs: Basic Metabolic Panel:  Recent Labs Lab 04/23/13 1320 04/23/13 1445  NA 136  --   K 6.1* 6.4*  CL 101  --   CO2 28  --   GLUCOSE 116*  --   BUN 37*  --   CREATININE 3.07*  --   CALCIUM 11.2*  --    Liver Function Tests: No results found for this basename: AST, ALT, ALKPHOS, BILITOT, PROT, ALBUMIN,  in the last 168 hours No results found for this basename: LIPASE, AMYLASE,  in the last 168 hours No results found for this basename: AMMONIA,  in the last 168 hours CBC:  Recent Labs Lab 04/19/13 1418 04/23/13 1320  WBC  --  11.9*  HGB 11.2* 10.4*  HCT  --  33.4*  MCV  --  98.2  PLT  --  220   PT/INR: @LABRCNTIP (inr:5) Cardiac Enzymes: )No results found for this basename: CKTOTAL, CKMB, CKMBINDEX, TROPONINI,  in the last 168 hours CBG: No results found for this basename: GLUCAP,  in the last 168 hours  Iron Studies: No results found for this basename: IRON, TIBC, TRANSFERRIN, FERRITIN,  in the last 168 hours  Xrays/Other Studies: Dg Chest 2 View (if Patient Has Fever And/or Copd)  04/23/2013   CLINICAL DATA:  Shortness of breath.  EXAM: CHEST  2 VIEW  COMPARISON:  01/17/2013.  FINDINGS: Low lung volumes. Cardiomegaly. Bilateral  pulmonary opacities representing either early congestive failure or bilateral pneumonia. Negative osseous structures. Worsening aeration from priors.  IMPRESSION: Cardiomegaly with bilateral pulmonary opacities are new/increased from priors. Question bilateral pneumonia versus failure.   Electronically Signed   By: Davonna Belling M.D.   On: 04/23/2013 14:16     Assessment/Plan: 1.  AKI/CKD:  Not much off of her baseline and likely due to recurrent hyeprcalcemia and renal hemodynamics in setting of acute illness.  No indication for HD at this point.  Will cont to treat medically and transfer her to [facility] if she does not respond to therapy. 2. Hypercalcemia- would treat with IV lasix given pulm edema and consider sensipar and/or calcitonin. 3. Hyperkalemia- pt already given insulin/D50/albuterol/bicarb/kayexalate and recommended IV lasix 80mg  bid.   4. CAP- started on rocephin and azithromycin. 5. SOB- as above.  Possible PNA +/- pulm edema due to lack of diuretic use. 6. Primary hyperparathyroidism c/b secondary HPTH- (already had 2 adenomas removed) consider sensipar.  Will check iPTH and follow. 7. Anemia of chronic disease- will check  8. Bipolar disorder- cont with meds 9. HTN- stable (No ACE/ARB) 10. Peripheral edema +/- pulmonary: as above administer IV lasix and follow. 11. Tobacco abuse- stress smoking cessation   Charmion Hapke A 04/23/2013, 5:17 PM

## 2013-04-23 NOTE — H&P (Signed)
[facility] History and Physical  Jennifer Lawson OZH:[id number] DOB: 1960/09/13 DOA: 04/23/2013  Referring physician: Dr. Wilkie Aye PCP: Jackie Plum, MD   Chief Complaint: Shortness of breath  HPI: Jennifer Lawson is a 52 y.o. female  With history of bipolar disorder, hypertension, chronic kidney disease, in the ED physician's note they mention diabetes the patient is not on any diabetic medication at home nor is it mentioned on her problem list. The patient presents with 2 days complaints of shortness of breath which has been persistent since onset and getting worse. Associated with dyspnea on exertion and cough. Denies any sick contacts or recent travel.  While in the ED patient was found to have an elevated potassium level confirmed a 6.4 which she was given insulin, Lasix, albuterol, and sodium bicarbonate. We were consulted at for admission given elevated potassium level and persistent shortness of breath. Patient reportedly hypoxic on room air.  Review of Systems:  Constitutional:  No weight loss, night sweats, Fevers, chills, fatigue.  HEENT:  No headaches, Difficulty swallowing,Tooth/dental problems,Sore throat,  No sneezing, itching, ear ache, nasal congestion, post nasal drip,  Cardio-vascular:  No chest pain, Orthopnea, PND, swelling in lower extremities, anasarca, dizziness, palpitations  GI:  No heartburn, indigestion, abdominal pain, nausea, vomiting, diarrhea, change in bowel habits, loss of appetite  Resp:  + shortness of breath with exertion or at rest. No excess mucus, no productive cough, + cough, No coughing up of blood.No change in color of mucus.+ wheezing.No chest wall deformity  Skin:  no rash or lesions.  GU:  no dysuria, change in color of urine, no urgency or frequency. No flank pain.  Musculoskeletal:  No joint pain or swelling. No decreased range of motion. No back pain.  Psych:  No change in mood or affect. No depression or anxiety. No  memory loss.   Past Medical History  Diagnosis Date  . Mental disorder   . Depression   . Hypertension   . Diabetes mellitus without complication   . Overdose   . Tobacco use disorder 11/13/2012   Past Surgical History  Procedure Laterality Date  . Right knee replacement      she says it was last year.  . Esophagogastroduodenoscopy Left 11/14/2012    Procedure: ESOPHAGOGASTRODUODENOSCOPY (EGD);  Surgeon: Charna Elizabeth, MD;  Location: [facility];  Service: Endoscopy;  Laterality: Left;   Social History:  reports that she has been smoking.  She has never used smokeless tobacco. She reports that she drinks alcohol. She reports that she uses illicit drugs.  Allergies  Allergen Reactions  . Codeine Sulfate Anaphylaxis    Daughter called about having this allergy   . Haldol [Haloperidol Lactate] Shortness Of Breath  . Risperidone And Related Shortness Of Breath    History reviewed. No pertinent family history.   Prior to Admission medications   Medication Sig Start Date End Date Taking? Authorizing Provider  albuterol (PROVENTIL HFA;VENTOLIN HFA) 108 (90 BASE) MCG/ACT inhaler Inhale 1 puff into the lungs every 6 (six) hours as needed for wheezing or shortness of breath.   Yes Historical Provider, MD  amLODipine (NORVASC) 10 MG tablet Take 10 mg by mouth at bedtime.  02/09/13  Yes Historical Provider, MD  Aspirin-Caffeine (BC FAST PAIN RELIEF PO) Take 1 packet by mouth as needed (headache).   Yes Historical Provider, MD  divalproex (DEPAKOTE) 500 MG DR tablet Take 1,000 mg by mouth at bedtime.    Yes Historical Provider, MD  fluticasone (FLONASE) 50 MCG/ACT nasal  spray Place 2 sprays into both nostrils daily.   Yes Historical Provider, MD  Fluticasone-Salmeterol (ADVAIR) 100-50 MCG/DOSE AEPB Inhale 1 puff into the lungs 2 (two) times daily.   Yes Historical Provider, MD  furosemide (LASIX) 80 MG tablet Take 160 mg by mouth daily.   Yes Historical Provider, MD  gabapentin (NEURONTIN) 600 MG  tablet Take 1,200 mg by mouth at bedtime.   Yes Historical Provider, MD  metoprolol succinate (TOPROL-XL) 100 MG 24 hr tablet Take 100 mg by mouth at bedtime. Take with or immediately following a meal.   Yes Historical Provider, MD  Multiple Vitamins-Minerals (CENTRUM SILVER PO) Take 1 tablet by mouth daily.   Yes Historical Provider, MD  omeprazole (PRILOSEC) 20 MG capsule Take 20 mg by mouth daily.  02/18/13  Yes Historical Provider, MD  predniSONE (DELTASONE) 50 MG tablet Take 50 mg by mouth daily with breakfast. X 5 days, started 04/23/13   Yes Historical Provider, MD  simvastatin (ZOCOR) 20 MG tablet Take 20 mg by mouth every evening.   Yes Historical Provider, MD  ziprasidone (GEODON) 60 MG capsule Take 120 mg by mouth at bedtime.   Yes Historical Provider, MD   Physical Exam: Filed Vitals:   04/23/13 1318  BP: 142/87  Pulse: 88  Temp: 98.6 F (37 C)  Resp: 18    BP 142/87  Pulse 88  Temp(Src) 98.6 F (37 C) (Oral)  Resp 18  SpO2 97%  General: Alert, awake, oriented x3, in no acute distress. Head: Atraumatic, normocephalic Eyes: Extraocular movements intact, non-icteric Nose: No rhinorrhea, normal exterior appearance Neck: Supple, no goiter Heart: Regular rate and rhythm, without murmurs, rubs, gallops. Lungs: Nasal cannula in place, patient speaking in full sentences, expiratory wheezes auscultated diffusely Abdomen: Soft, nontender, nondistended, positive bowel sounds. Extremities: No clubbing cyanosis or edema with positive pedal pulses. Neuro: Grossly intact, nonfocal.          Labs on Admission:  Basic Metabolic Panel:  Recent Labs Lab 04/23/13 1320 04/23/13 1445  NA 136  --   K 6.1* 6.4*  CL 101  --   CO2 28  --   GLUCOSE 116*  --   BUN 37*  --   CREATININE 3.07*  --   CALCIUM 11.2*  --    Liver Function Tests: No results found for this basename: AST, ALT, ALKPHOS, BILITOT, PROT, ALBUMIN,  in the last 168 hours No results found for this basename:  LIPASE, AMYLASE,  in the last 168 hours No results found for this basename: AMMONIA,  in the last 168 hours CBC:  Recent Labs Lab 04/19/13 1418 04/23/13 1320  WBC  --  11.9*  HGB 11.2* 10.4*  HCT  --  33.4*  MCV  --  98.2  PLT  --  220   Cardiac Enzymes: No results found for this basename: CKTOTAL, CKMB, CKMBINDEX, TROPONINI,  in the last 168 hours  BNP (last 3 results)  Recent Labs  11/17/12 0933 01/17/13 04/23/13 1445  PROBNP 643.4* 6078.0* 7094.0*   CBG: No results found for this basename: GLUCAP,  in the last 168 hours  Radiological Exams on Admission: Dg Chest 2 View (if Patient Has Fever And/or Copd)  04/23/2013   CLINICAL DATA:  Shortness of breath.  EXAM: CHEST  2 VIEW  COMPARISON:  01/17/2013.  FINDINGS: Low lung volumes. Cardiomegaly. Bilateral pulmonary opacities representing either early congestive failure or bilateral pneumonia. Negative osseous structures. Worsening aeration from priors.  IMPRESSION: Cardiomegaly with bilateral pulmonary opacities  are new/increased from priors. Question bilateral pneumonia versus failure.   Electronically Signed   By: Davonna Belling M.D.   On: 04/23/2013 14:16    EKG: Independently reviewed. Sinus rhythm with no ST elevations or depressions  Assessment/Plan Principal Problems: Acute respiratory - Supplemental oxygen - Etiology uncertain CHF versus infectious etiology. Patient given extra dose of Lasix in the ED as well as IV antibiotics. We'll continue home Lasix regimen and place on IV Rocephin and azithromycin. - Moderate her white blood cell count and vitals - BNP elevated at 7000 but patient has history of chronic kidney disease - Daily weights and strict I.'s and O.'s with fluid restriction - Followup blood cultures, sputum cultures - Urine Legionella/streptococcal antigen ordered - Initial troponin negative    Hyperkalemia - Patient was given insulin, Lasix injection, albuterol, and sodium bicarbonate - Also given  Kayexalate in the ED - Will repeat Kayexalate next a.m. - Monitor in telemetry -EKG next a.m.  Active Problems:   CKD (chronic kidney disease) - Stable currently and near baseline of 2.4-2.8 - Reassess serum creatinine next a.m.    HTN (hypertension) - Stable continue amlodipine    Tobacco use disorder - Recommend cessation and will offer nicotine patch  DVT prophylaxis Recommend at early ambulation and SCDs  Code Status: full Family Communication: No family present. Discussed directly with patient. Disposition Plan: With improvement in respiratory condition most likely discharge within the next one to 3 days  Time spent: > 60 minutes of critical care time.  Penny Pia [facility] Pager [phone]

## 2013-04-24 DIAGNOSIS — D631 Anemia in chronic kidney disease: Secondary | ICD-10-CM | POA: Diagnosis not present

## 2013-04-24 DIAGNOSIS — J189 Pneumonia, unspecified organism: Secondary | ICD-10-CM | POA: Diagnosis not present

## 2013-04-24 DIAGNOSIS — J441 Chronic obstructive pulmonary disease with (acute) exacerbation: Secondary | ICD-10-CM

## 2013-04-24 DIAGNOSIS — J9600 Acute respiratory failure, unspecified whether with hypoxia or hypercapnia: Secondary | ICD-10-CM | POA: Diagnosis not present

## 2013-04-24 DIAGNOSIS — I5033 Acute on chronic diastolic (congestive) heart failure: Principal | ICD-10-CM

## 2013-04-24 DIAGNOSIS — E875 Hyperkalemia: Secondary | ICD-10-CM | POA: Diagnosis not present

## 2013-04-24 DIAGNOSIS — F17200 Nicotine dependence, unspecified, uncomplicated: Secondary | ICD-10-CM

## 2013-04-24 DIAGNOSIS — R1033 Periumbilical pain: Secondary | ICD-10-CM | POA: Diagnosis not present

## 2013-04-24 DIAGNOSIS — N179 Acute kidney failure, unspecified: Secondary | ICD-10-CM | POA: Diagnosis not present

## 2013-04-24 DIAGNOSIS — I509 Heart failure, unspecified: Secondary | ICD-10-CM

## 2013-04-24 LAB — CBC
HCT: 30.4 % — ABNORMAL LOW (ref 36.0–46.0)
Hemoglobin: 9.1 g/dL — ABNORMAL LOW (ref 12.0–15.0)
MCH: 29.9 pg (ref 26.0–34.0)
MCHC: 29.9 g/dL — ABNORMAL LOW (ref 30.0–36.0)
MCV: 100 fL (ref 78.0–100.0)
Platelets: 196 10*3/uL (ref 150–400)
RBC: 3.04 MIL/uL — ABNORMAL LOW (ref 3.87–5.11)
RDW: 17.2 % — ABNORMAL HIGH (ref 11.5–15.5)
WBC: 8.9 10*3/uL (ref 4.0–10.5)

## 2013-04-24 LAB — RENAL FUNCTION PANEL
Albumin: 2.6 g/dL — ABNORMAL LOW (ref 3.5–5.2)
BUN: 41 mg/dL — ABNORMAL HIGH (ref 6–23)
CO2: 32 mEq/L (ref 19–32)
Calcium: 10.4 mg/dL (ref 8.4–10.5)
Chloride: 101 mEq/L (ref 96–112)
Creatinine, Ser: 3.12 mg/dL — ABNORMAL HIGH (ref 0.50–1.10)
GFR calc Af Amer: 18 mL/min — ABNORMAL LOW (ref >90)
GFR calc non Af Amer: 16 mL/min — ABNORMAL LOW (ref >90)
Glucose, Bld: 96 mg/dL (ref 70–99)
Phosphorus: 6 mg/dL — ABNORMAL HIGH (ref 2.3–4.6)
Potassium: 4.8 mEq/L (ref 3.5–5.1)
Sodium: 139 mEq/L (ref 135–145)

## 2013-04-24 LAB — TSH
TSH: 0.374 u[IU]/mL (ref 0.350–4.500)
TSH: 0.303 u[IU]/mL — ABNORMAL LOW (ref 0.350–4.500)

## 2013-04-24 LAB — LEGIONELLA ANTIGEN, URINE: Legionella Antigen, Urine: NEGATIVE

## 2013-04-24 LAB — EXPECTORATED SPUTUM ASSESSMENT W GRAM STAIN, RFLX TO RESP C

## 2013-04-24 LAB — EXPECTORATED SPUTUM ASSESSMENT W REFEX TO RESP CULTURE

## 2013-04-24 LAB — FERRITIN: Ferritin: 37 ng/mL (ref 10–291)

## 2013-04-24 LAB — VITAMIN D 25 HYDROXY (VIT D DEFICIENCY, FRACTURES): Vit D, 25-Hydroxy: 50 ng/mL (ref 30–89)

## 2013-04-24 MED ORDER — ACETAMINOPHEN 325 MG PO TABS
650.0000 mg | ORAL_TABLET | Freq: Four times a day (QID) | ORAL | Status: DC | PRN
  Administered 2013-04-24: 650 mg via ORAL
  Filled 2013-04-24 (×2): qty 2

## 2013-04-24 MED ORDER — IPRATROPIUM BROMIDE 0.02 % IN SOLN
0.5000 mg | Freq: Four times a day (QID) | RESPIRATORY_TRACT | Status: DC
  Administered 2013-04-25 (×2): 0.5 mg via RESPIRATORY_TRACT
  Filled 2013-04-24 (×3): qty 2.5

## 2013-04-24 MED ORDER — IPRATROPIUM BROMIDE 0.02 % IN SOLN
0.5000 mg | RESPIRATORY_TRACT | Status: DC
  Administered 2013-04-24 (×2): 0.5 mg via RESPIRATORY_TRACT
  Filled 2013-04-24 (×2): qty 2.5

## 2013-04-24 MED ORDER — FUROSEMIDE 10 MG/ML IJ SOLN
80.0000 mg | Freq: Once | INTRAMUSCULAR | Status: AC
  Administered 2013-04-24: 80 mg via INTRAVENOUS
  Filled 2013-04-24: qty 8

## 2013-04-24 MED ORDER — ALBUTEROL SULFATE (5 MG/ML) 0.5% IN NEBU
2.5000 mg | INHALATION_SOLUTION | RESPIRATORY_TRACT | Status: DC | PRN
  Administered 2013-04-26: 2.5 mg via RESPIRATORY_TRACT
  Filled 2013-04-24: qty 0.5

## 2013-04-24 MED ORDER — DM-GUAIFENESIN ER 30-600 MG PO TB12
1.0000 | ORAL_TABLET | Freq: Two times a day (BID) | ORAL | Status: DC
  Administered 2013-04-24 – 2013-04-26 (×6): 1 via ORAL
  Filled 2013-04-24 (×7): qty 1

## 2013-04-24 MED ORDER — ALBUTEROL SULFATE (5 MG/ML) 0.5% IN NEBU
2.5000 mg | INHALATION_SOLUTION | Freq: Four times a day (QID) | RESPIRATORY_TRACT | Status: DC
  Administered 2013-04-25 (×2): 2.5 mg via RESPIRATORY_TRACT
  Filled 2013-04-24 (×3): qty 0.5

## 2013-04-24 MED ORDER — INFLUENZA VAC SPLIT QUAD 0.5 ML IM SUSP
0.5000 mL | INTRAMUSCULAR | Status: AC
  Administered 2013-04-25: 0.5 mL via INTRAMUSCULAR
  Filled 2013-04-24 (×2): qty 0.5

## 2013-04-24 MED ORDER — METHYLPREDNISOLONE SODIUM SUCC 125 MG IJ SOLR
60.0000 mg | Freq: Three times a day (TID) | INTRAMUSCULAR | Status: AC
  Administered 2013-04-24 – 2013-04-25 (×5): 60 mg via INTRAVENOUS
  Filled 2013-04-24 (×6): qty 0.96

## 2013-04-24 MED ORDER — FUROSEMIDE 10 MG/ML IJ SOLN
80.0000 mg | Freq: Three times a day (TID) | INTRAMUSCULAR | Status: AC
  Administered 2013-04-24 – 2013-04-25 (×4): 80 mg via INTRAVENOUS
  Filled 2013-04-24 (×5): qty 8

## 2013-04-24 NOTE — Progress Notes (Signed)
  [facility] Progress Note    Subjective: 2300 uop yest with IV lasix, net I/O -1500, K down to 4.8 and creat 3.12 minimally changed. Ca down 10.4, adjusts to 11.6   Exam  Blood pressure 100/65, pulse 88, temperature 98.8 F (37.1 C), temperature source Oral, resp. rate 20, height 5\' 2"  (1.575 m), weight 95.8 kg (211 lb 3.2 oz), SpO2 90.00%. General appearance: fatigued, no distress and slowed mentation  Neck: +JVD  Resp: rhonchi bilaterally and wheezes posterior - bilateral  Cardio: regular rate and rhythm, S1, S2 normal, no murmur, click, rub or gallop  GI: soft, non-tender; bowel sounds normal; no masses, no organomegaly  Extremities: edema 1+ pitting pretibial  Neurologic: alert, interactive  Assessment:  1. AKI/CKD: Not much off of her baseline and likely due to recurrent hypercalcemia and renal hemodynamics in setting of acute illness 2. Hypercalcemia- improving with diuresis 3. Hyperkalemia- resolved 4. CAP- started on rocephin and azithromycin. 5. SOB- as above. Possible PNA +/- pulm edema due to lack of diuretic use. 6. Primary hyperparathyroidism c/b secondary HPTH- (already had 2 adenomas removed) consider sensipar. Will check iPTH and follow. 7. Anemia of chronic disease- will check  8. Bipolar disorder- cont with meds 9. HTN- stable (No ACE/ARB) 10. Peripheral edema- improving   Rec: Cont IV lasix, f/u labs in am    Vinson Moselle MD  pager [phone]    cell [phone]  04/24/2013, 6:00 PM   Recent Labs Lab 04/23/13 1320 04/23/13 1445 04/23/13 1900 04/24/13 0410  NA 136  --   --  139  K 6.1* 6.4*  --  4.8  CL 101  --   --  101  CO2 28  --   --  32  GLUCOSE 116*  --   --  96  BUN 37*  --   --  41*  CREATININE 3.07*  --   --  3.12*  CALCIUM 11.2*  --   --  10.4  PHOS  --  4.2 4.9* 6.0*    Recent Labs Lab 04/24/13 0410  ALBUMIN 2.6*    Recent Labs Lab 04/19/13 1418 04/23/13 1320 04/24/13 0410  WBC  --  11.9* 8.9  HGB 11.2* 10.4*  9.1*  HCT  --  33.4* 30.4*  MCV  --  98.2 100.0  PLT  --  220 196   . albuterol  2.5 mg Nebulization Q4H  . azithromycin  500 mg Intravenous Q24H  . cefTRIAXone (ROCEPHIN)  IV  1 g Intravenous Q24H  . dextromethorphan-guaiFENesin  1 tablet Oral BID  . divalproex  1,000 mg Oral QHS  . fluticasone  2 spray Each Nare Daily  . furosemide  160 mg Oral Daily  . gabapentin  1,200 mg Oral QHS  . ipratropium  0.5 mg Nebulization Q4H  . methylPREDNISolone (SOLU-MEDROL) injection  60 mg Intravenous Q8H  . metoprolol succinate  100 mg Oral QHS  . mometasone-formoterol  2 puff Inhalation BID  . nicotine  14 mg Transdermal Daily  . pantoprazole  40 mg Oral Daily  . simvastatin  20 mg Oral QPM  . sodium chloride  3 mL Intravenous Q12H  . sodium chloride  3 mL Intravenous Q12H  . ziprasidone  120 mg Oral QHS     sodium chloride, acetaminophen, albuterol, ondansetron (ZOFRAN) IV, ondansetron, sodium chloride

## 2013-04-24 NOTE — Progress Notes (Signed)
Pt lives in [place], plan to return. Pt will need to go to Social Services for assistance with food stamps and eye glasses referral. Pt was made aware. [employer] used.

## 2013-04-24 NOTE — Progress Notes (Signed)
[facility] PROGRESS NOTE  Jennifer Lawson XBJ:[id number] DOB: October 15, 1960 DOA: 04/23/2013 PCP: Jackie Plum, MD  Brief history 52 year old female with a history of CHF, COPD with continued tobacco use, CKD stage IV, primary hyperparathyroidism, bipolar disorder, and hypertension presents with 2-3 day history of worsening shortness of breath. The patient states that she had not been taking furosemide for approximately 3 days. The patient is not weigh herself daily. She continues to smoke approximately 1/2 pack per day. She relates a history of increasing abdominal girth and lower extremity edema. She also complains of cough with yellow sputum. She denied any fevers, chills, chest discomfort, vomiting, diarrhea, abdominal pain. At the time of admission, the patient was given one dose of intravenous furosemide, 80 mg. She was started on aerosolized albuterol. Patient breathing is slowly improving.  Assessment/Plan: Acute respiratory failure -Multifactorial including fluid overload as well as COPD exacerbation -IV furosemide -IV steroids -Supplemental oxygen Acute on chronic diastolic CHF -Patient Did not take her furosemide at home x3 days -Give additional dose of IV furosemide this afternoon -Recheck chest x-ray in the morning -Daily weights -Fluid restrict -Patient's weight was 90.3 kg--- 01/22/2013      92.8 kg--02/21/2012 -Today's weight 95.8 kg  -Clinically remains hypervolemic -01/17/2013 echocardiogram EF 65-70% COPD exacerbation -Patient has significant wheezing and bilateral rales -Intravenous Solu-Medrol -Aerosolized albuterol and Atrovent -continue ceftriaxone and azithromycin Tobacco abuse -Tobacco cessation discussed -Nicoderm patch Acute on chronic renal failure (CKD stage IV)  -Baseline creatinine 2.5-2.8  - Continue to monitor with diuresis Hypercalcemia  -Secondary to primary hyperparathyroidism  -Patient has had 2 parathyroid adenomas removed 10  years ago  -Continued intravenous furosemide  Hypertension  -Continue metoprolol succinate  -Discontinued amlodipine and monitor blood pressure  -Blood pressure has improved with diuresis  Family Communication:   None today Disposition Plan:   Home 1-2 days  Antibiotics:  Ceftriaxone 04/23/2013>>>   azithromycin 04/23/2013>>>           Procedures/Studies: Dg Chest 2 View (if Patient Has Fever And/or Copd)  04/23/2013   CLINICAL DATA:  Shortness of breath.  EXAM: CHEST  2 VIEW  COMPARISON:  01/17/2013.  FINDINGS: Low lung volumes. Cardiomegaly. Bilateral pulmonary opacities representing either early congestive failure or bilateral pneumonia. Negative osseous structures. Worsening aeration from priors.  IMPRESSION: Cardiomegaly with bilateral pulmonary opacities are new/increased from priors. Question bilateral pneumonia versus failure.   Electronically Signed   By: Davonna Belling M.D.   On: 04/23/2013 14:16         Subjective:  patient states she is breathing better today but remain short of breath with minimal exertion. Denies any fevers, chills, chest discomfort, nausea, vomiting, diarrhea, abdominal pain, dysuria, hematuria. Denies any dizziness or syncope.   Objective: Filed Vitals:   04/23/13 2342 04/24/13 0409 04/24/13 0537 04/24/13 0810  BP:   115/68   Pulse:   88   Temp:   98.8 F (37.1 C)   TempSrc:   Oral   Resp:   22   Height:      Weight:   95.8 kg (211 lb 3.2 oz)   SpO2: 95% 92% 93% 92%    Intake/Output Summary (Last 24 hours) at 04/24/13 1510 Last data filed at 04/24/13 0322  Gross per 24 hour  Intake    500 ml  Output   2300 ml  Net  -1800 ml   Weight change:  Exam:   General:  Pt is alert, follows commands appropriately, not in acute distress  HEENT: No  icterus, No thrush,  [place]/AT  Cardiovascular: RRR, S1/S2, no rubs, no gallops  Respiratory: bilateral rales with scattered wheezing. Good air movement.  Abdomen: Soft/+BS, non tender,  non distended, no guarding  Extremities: 1+ edema, No lymphangitis, No petechiae, No rashes, no synovitis  Data Reviewed: Basic Metabolic Panel:  Recent Labs Lab 04/23/13 1320 04/23/13 1445 04/23/13 1900 04/24/13 0410  NA 136  --   --  139  K 6.1* 6.4*  --  4.8  CL 101  --   --  101  CO2 28  --   --  32  GLUCOSE 116*  --   --  96  BUN 37*  --   --  41*  CREATININE 3.07*  --   --  3.12*  CALCIUM 11.2*  --   --  10.4  MG  --   --  2.6*  --   PHOS  --  4.2 4.9* 6.0*   Liver Function Tests:  Recent Labs Lab 04/24/13 0410  ALBUMIN 2.6*   No results found for this basename: LIPASE, AMYLASE,  in the last 168 hours No results found for this basename: AMMONIA,  in the last 168 hours CBC:  Recent Labs Lab 04/19/13 1418 04/23/13 1320 04/24/13 0410  WBC  --  11.9* 8.9  HGB 11.2* 10.4* 9.1*  HCT  --  33.4* 30.4*  MCV  --  98.2 100.0  PLT  --  220 196   Cardiac Enzymes: No results found for this basename: CKTOTAL, CKMB, CKMBINDEX, TROPONINI,  in the last 168 hours BNP: No components found with this basename: POCBNP,  CBG: No results found for this basename: GLUCAP,  in the last 168 hours  Recent Results (from the past 240 hour(s))  CULTURE, BLOOD (ROUTINE X 2)     Status: None   Collection Time    04/23/13  4:30 PM      Result Value Range Status   Specimen Description BLOOD LEFT ANTECUBITAL   Final   Special Requests BOTTLES DRAWN AEROBIC AND ANAEROBIC 3 CC EA   Final   Culture  Setup Time     Final   Value: 04/23/2013 22:18     Performed at [facility]   Culture     Final   Value:        BLOOD CULTURE RECEIVED NO GROWTH TO DATE CULTURE WILL BE HELD FOR 5 DAYS BEFORE ISSUING A FINAL NEGATIVE REPORT     Performed at [facility]   Report Status PENDING   Incomplete  CULTURE, BLOOD (ROUTINE X 2)     Status: None   Collection Time    04/23/13  4:35 PM      Result Value Range Status   Specimen Description BLOOD BLOOD LEFT FOREARM   Final    Special Requests BOTTLES DRAWN AEROBIC AND ANAEROBIC 3 CC EA   Final   Culture  Setup Time     Final   Value: 04/23/2013 22:18     Performed at [facility]   Culture     Final   Value:        BLOOD CULTURE RECEIVED NO GROWTH TO DATE CULTURE WILL BE HELD FOR 5 DAYS BEFORE ISSUING A FINAL NEGATIVE REPORT     Performed at [facility]   Report Status PENDING   Incomplete     Scheduled Meds: . albuterol  2.5 mg Nebulization Q4H  . amLODipine  10 mg Oral QHS  . azithromycin  500 mg Intravenous Q24H  . cefTRIAXone (ROCEPHIN)  IV  1 g Intravenous Q24H  . dextromethorphan-guaiFENesin  1 tablet Oral BID  . divalproex  1,000 mg Oral QHS  . fluticasone  2 spray Each Nare Daily  . furosemide  80 mg Intravenous Once  . furosemide  160 mg Oral Daily  . gabapentin  1,200 mg Oral QHS  . metoprolol succinate  100 mg Oral QHS  . mometasone-formoterol  2 puff Inhalation BID  . nicotine  14 mg Transdermal Daily  . pantoprazole  40 mg Oral Daily  . simvastatin  20 mg Oral QPM  . sodium chloride  3 mL Intravenous Q12H  . sodium chloride  3 mL Intravenous Q12H  . ziprasidone  120 mg Oral QHS   Continuous Infusions:    Larry Knipp, DO  [facility] Pager [phone]  If 7PM-7AM, please contact night-coverage [email or web address] Password [email or web address] 04/24/2013, 3:10 PM   LOS: 1 day

## 2013-04-25 ENCOUNTER — Other Ambulatory Visit: Payer: Self-pay | Admitting: *Deleted

## 2013-04-25 DIAGNOSIS — E875 Hyperkalemia: Secondary | ICD-10-CM | POA: Diagnosis not present

## 2013-04-25 DIAGNOSIS — N186 End stage renal disease: Secondary | ICD-10-CM

## 2013-04-25 DIAGNOSIS — R0602 Shortness of breath: Secondary | ICD-10-CM | POA: Diagnosis not present

## 2013-04-25 DIAGNOSIS — J9600 Acute respiratory failure, unspecified whether with hypoxia or hypercapnia: Secondary | ICD-10-CM | POA: Diagnosis not present

## 2013-04-25 DIAGNOSIS — F319 Bipolar disorder, unspecified: Secondary | ICD-10-CM | POA: Diagnosis not present

## 2013-04-25 DIAGNOSIS — I5033 Acute on chronic diastolic (congestive) heart failure: Secondary | ICD-10-CM | POA: Diagnosis not present

## 2013-04-25 DIAGNOSIS — D631 Anemia in chronic kidney disease: Secondary | ICD-10-CM | POA: Diagnosis not present

## 2013-04-25 DIAGNOSIS — N179 Acute kidney failure, unspecified: Secondary | ICD-10-CM | POA: Diagnosis not present

## 2013-04-25 LAB — BASIC METABOLIC PANEL
BUN: 49 mg/dL — ABNORMAL HIGH (ref 6–23)
CO2: 29 mEq/L (ref 19–32)
Calcium: 9.9 mg/dL (ref 8.4–10.5)
Chloride: 94 mEq/L — ABNORMAL LOW (ref 96–112)
Creatinine, Ser: 3.18 mg/dL — ABNORMAL HIGH (ref 0.50–1.10)
GFR calc Af Amer: 18 mL/min — ABNORMAL LOW (ref >90)
GFR calc non Af Amer: 16 mL/min — ABNORMAL LOW (ref >90)
Glucose, Bld: 169 mg/dL — ABNORMAL HIGH (ref 70–99)
Potassium: 4.2 mEq/L (ref 3.5–5.1)
Sodium: 135 mEq/L (ref 135–145)

## 2013-04-25 LAB — GLUCOSE, CAPILLARY: Glucose-Capillary: 120 mg/dL — ABNORMAL HIGH (ref 70–99)

## 2013-04-25 LAB — PARATHYROID HORMONE, INTACT (NO CA): PTH: 291.9 pg/mL — ABNORMAL HIGH (ref 14.0–72.0)

## 2013-04-25 LAB — MAGNESIUM: Magnesium: 2.3 mg/dL (ref 1.5–2.5)

## 2013-04-25 MED ORDER — CEFPODOXIME PROXETIL 200 MG PO TABS: 200.0000 mg | ORAL_TABLET | Freq: Every day | ORAL | Status: DC

## 2013-04-25 MED ORDER — AZITHROMYCIN 500 MG PO TABS
500.0000 mg | ORAL_TABLET | ORAL | Status: DC
  Filled 2013-04-25: qty 1

## 2013-04-25 MED ORDER — CEFPODOXIME PROXETIL 200 MG PO TABS
200.0000 mg | ORAL_TABLET | Freq: Every day | ORAL | Status: DC
  Administered 2013-04-26: 200 mg via ORAL
  Filled 2013-04-25: qty 1

## 2013-04-25 MED ORDER — MENTHOL 3 MG MT LOZG
1.0000 | LOZENGE | OROMUCOSAL | Status: DC | PRN
  Administered 2013-04-25: 3 mg via ORAL
  Filled 2013-04-25: qty 9

## 2013-04-25 MED ORDER — GABAPENTIN 600 MG PO TABS: 600.0000 mg | ORAL_TABLET | Freq: Every day | ORAL | Status: DC

## 2013-04-25 MED ORDER — ALBUTEROL SULFATE (5 MG/ML) 0.5% IN NEBU
2.5000 mg | INHALATION_SOLUTION | Freq: Four times a day (QID) | RESPIRATORY_TRACT | Status: DC
  Administered 2013-04-25 – 2013-04-26 (×4): 2.5 mg via RESPIRATORY_TRACT
  Filled 2013-04-25 (×4): qty 0.5

## 2013-04-25 MED ORDER — AZITHROMYCIN 500 MG PO TABS: 500.0000 mg | ORAL_TABLET | ORAL | Status: DC

## 2013-04-25 MED ORDER — FUROSEMIDE 80 MG PO TABS
120.0000 mg | ORAL_TABLET | Freq: Two times a day (BID) | ORAL | Status: DC
  Administered 2013-04-26: 120 mg via ORAL
  Filled 2013-04-25 (×3): qty 1

## 2013-04-25 MED ORDER — PREDNISONE 50 MG PO TABS
60.0000 mg | ORAL_TABLET | Freq: Two times a day (BID) | ORAL | Status: DC
  Administered 2013-04-26: 60 mg via ORAL
  Filled 2013-04-25 (×3): qty 1

## 2013-04-25 MED ORDER — IPRATROPIUM BROMIDE 0.02 % IN SOLN
0.5000 mg | Freq: Four times a day (QID) | RESPIRATORY_TRACT | Status: DC
  Administered 2013-04-25 – 2013-04-26 (×4): 0.5 mg via RESPIRATORY_TRACT
  Filled 2013-04-25 (×4): qty 2.5

## 2013-04-25 MED ORDER — PREDNISONE 20 MG PO TABS: 60.0000 mg | ORAL_TABLET | Freq: Two times a day (BID) | ORAL | Status: DC

## 2013-04-25 MED ORDER — FUROSEMIDE 40 MG PO TABS: 120.0000 mg | ORAL_TABLET | Freq: Two times a day (BID) | ORAL | Status: DC

## 2013-04-25 NOTE — Progress Notes (Signed)
Came to visit patient to discuss and explain [facility] Care Management services. Patient reports she does take her medicine but "just forgot to take my lasix". She endorses not having a scale to weigh herself daily. States she can purchase one at the beginning of the month when she gets her check on the 3rd. Also states she lives in a hotel and is "just fine living there". States she is looking for housing but is fine where she is currently living. Reports she cares for her 34 year old grandson and has been managing on her own. Consents were signed and [facility] Care Management packet left at bedside. Spoke with inpatient RNCM to make aware of the above and that [facility] Care Management will follow up post hospital discharge. If patient has home health services arranged, [facility] Care Management will not replace those services.  Raiford Noble, MSN-Ed, RN,BSN- [facility] Liaison-[phone]

## 2013-04-25 NOTE — Evaluation (Signed)
Physical Therapy Evaluation Patient Details Name: Jennifer Lawson MRN: [id number] DOB: [birth year] Today's Date: 04/25/2013 Time: 0865-7846 PT Time Calculation (min): 19 min  PT Assessment / Plan / Recommendation History of Present Illness  52 yo female admitted with acute resp failure. Hx of bipolar d/o, HTN, R TKA  Clinical Impression  On eval, pt required Min guard assist for mobility-able to ambulate ~500 feet while pushing IV pole. Plan is for pt to return to her hotel room. Recommend daily mobility with nursing. Do not anticipate any further PT needs at discharge.     PT Assessment  Patient needs continued PT services    Follow Up Recommendations  No PT follow up    Does the patient have the potential to tolerate intense rehabilitation      Barriers to Discharge        Equipment Recommendations  None recommended by PT    Recommendations for Other Services     Frequency Min 3X/week    Precautions / Restrictions Precautions Precautions: None Restrictions Weight Bearing Restrictions: No   Pertinent Vitals/Pain No c/o pain      Mobility  Bed Mobility Bed Mobility: Supine to Sit Supine to Sit: 6: Modified independent (Device/Increase time) Transfers Transfers: Sit to Stand;Stand to Sit Sit to Stand: 6: Modified independent (Device/Increase time);From bed;From toilet Stand to Sit: 6: Modified independent (Device/Increase time);To toilet;To chair/3-in-1 Ambulation/Gait Ambulation/Gait Assistance: 4: Min guard Ambulation Distance (Feet): 500 Feet Ambulation/Gait Assistance Details: Pt pushed IV pole. Ambulated ~15 feet in room without any support. No LOB. Dyspnea 1/4.  Gait Pattern: Step-through pattern    Exercises     PT Diagnosis: Generalized weakness;Difficulty walking  PT Problem List: Decreased mobility;Decreased strength PT Treatment Interventions: Gait training;Functional mobility training;Therapeutic activities;Therapeutic exercise;Patient/family  education     PT Goals(Current goals can be found in the care plan section) Acute Rehab PT Goals Patient Stated Goal: home. live longer PT Goal Formulation: With patient Time For Goal Achievement: 05/09/13 Potential to Achieve Goals: Good  Visit Information  Last PT Received On: 04/25/13 Assistance Needed: +1 History of Present Illness: 52 yo female admitted with acute resp failure. Hx of bipolar d/o, HTN, R TKA       Prior Functioning  Home Living Family/patient expects to be discharged to:: Other (Comment) (hotel) Prior Function Level of Independence: Independent Comments: pt has a walker that is at her daughters if she needs it Communication Communication: No difficulties    Cognition  Cognition Arousal/Alertness: Awake/alert Behavior During Therapy: WFL for tasks assessed/performed Overall Cognitive Status: Within Functional Limits for tasks assessed    Extremity/Trunk Assessment Upper Extremity Assessment Upper Extremity Assessment: Overall WFL for tasks assessed Lower Extremity Assessment Lower Extremity Assessment: Generalized weakness Cervical / Trunk Assessment Cervical / Trunk Assessment: Normal   Balance    End of Session PT - End of Session Activity Tolerance: Patient tolerated treatment well Patient left: in chair;with call bell/phone within reach Nurse Communication: Mobility status  GP     Rebeca Alert, MPT Pager: [phone]

## 2013-04-25 NOTE — Discharge Summary (Addendum)
Physician Discharge Summary  Jennifer Lawson ZOX:[id number] DOB: 10-Nov-1960 DOA: 04/23/2013  PCP: Jackie Plum, MD  Admit date: 04/23/2013 Discharge date: 04/26/13 Recommendations for Outpatient Follow-up:  1. Pt will need to follow up with PCP in 1 weeks post discharge 2. Please obtain BMP to evaluate electrolytes and kidney function 3. Please also check CBC to evaluate Hg and Hct levels 4. Follow up with nephrology in 2 weeks  Discharge Diagnoses:  Principal Problem:   Acute respiratory failure Active Problems:   CKD (chronic kidney disease)   HTN (hypertension)   Uncontrolled hypertension   Tobacco use disorder   SOB (shortness of breath)   Hyperkalemia   Acute on chronic diastolic CHF (congestive heart failure)   COPD exacerbation Acute respiratory failure  -Multifactorial including fluid overload as well as COPD exacerbation  -IV furosemide  -IV steroids  -Supplemental oxygen  Acute on chronic diastolic CHF  -Patient Did not take her furosemide at home x3 days  -pt started on lasix 80mg  IV q8hrs per nephrology -Daily weights  -Fluid restrict  -Patient's weight was 90.3 kg--- 01/22/2013  92.8 kg--02/21/2012  -Today's weight 95.5 kg  -Clinically remains hypervolemic  -01/17/2013 echocardiogram EF 65-70%  -Patient will be discharged home with furosemide 120 mg by mouth twice a day -Nephrology ordered vein mapping for fistula placement prior to discharge COPD exacerbation  -Patient has significant wheezing and bilateral rales  -Intravenous Solu-Medrol 60 mg IV every 8 hours was started -The patient will be discharged home with prednisone 60 mg twice a day x2 days, 40 mg twice a day x2 days, 20 mg twice a day x2 days, 20 mg daily x2 days. -Aerosolized albuterol and Atrovent  -continue ceftriaxone and azithromycin  -The patient will be sent home on cefpodoxime and azithromycin for 4 additional days which will complete 7 days of antibiotic therapy  Tobacco abuse   -Tobacco cessation discussed  -Nicoderm patch  Acute on chronic renal failure (CKD stage IV)  -Baseline creatinine 2.5-2.8  - Continue to monitor with diuresis  Hypercalcemia  -Secondary to primary hyperparathyroidism  -Patient has had 2 parathyroid adenomas removed 10 years ago  -Continued intravenous furosemide--calcium improved Hypertension  -Continue metoprolol succinate  -Discontinued amlodipine and monitor blood pressure  -Blood pressure has improved with diuresis  The patient would not be restarted on amlodipine-   Antibiotics:  Ceftriaxone 04/23/2013>>> 04/25/2013  azithromycin 04/23/2013>>>04/25/2013   Discharge Condition: stable  Disposition: home  Diet:cardiac Wt Readings from Last 3 Encounters:  04/25/13 95.5 kg (210 lb 8.6 oz)  02/20/13 94.711 kg (208 lb 12.8 oz)  01/22/13 90.3 kg (199 lb 1.2 oz)    History of present illness:  Brief history  52 year old female with a history of CHF, COPD with continued tobacco use, CKD stage IV, primary hyperparathyroidism, bipolar disorder, and hypertension presents with 2-3 day history of worsening shortness of breath. The patient states that she had not been taking furosemide for approximately 3 days. The patient is not weigh herself daily. She continues to smoke approximately 1/2 pack per day. She relates a history of increasing abdominal girth and lower extremity edema. She also complains of cough with yellow sputum. She denied any fevers, chills, chest discomfort, vomiting, diarrhea, abdominal pain. At the time of admission, the patient was given one dose of intravenous furosemide, 80 mg. She was started on aerosolized albuterol. Patient breathing is slowly improving with intravenous furosemide and intravenous steroids. Nephrology consulted and recommended increasing IV furosemide. The patient was transitioned to oral steroids and oral  furosemide at the time of discharge.     Consultants: nephrology  Discharge  Exam: Filed Vitals:   04/25/13 1352  BP: 124/71  Pulse: 72  Temp: 98.4 F (36.9 C)  Resp: 20   Filed Vitals:   04/25/13 0925 04/25/13 1233 04/25/13 1352 04/25/13 1540  BP:   124/71   Pulse:   72   Temp:   98.4 F (36.9 C)   TempSrc:   Oral   Resp:   20   Height:      Weight:      SpO2: 95% 94% 91% 89%   General: A&O x 3, NAD, pleasant, cooperative Cardiovascular: RRR, no rub, no gallop, no S3 Respiratory: bibasilar crackles. Bibasilar wheezing. Good air movement. Abdomen:soft, nontender, nondistended, positive bowel sounds Extremities: 2+ edema, No lymphangitis, no petechiae  Discharge Instructions       Future Appointments Provider Department Dept Phone   05/06/2013 12:45 PM [facility] [phone]       Medication List    STOP taking these medications       amLODipine 10 MG tablet  Commonly known as:  NORVASC     BC FAST PAIN RELIEF PO      TAKE these medications       albuterol 108 (90 BASE) MCG/ACT inhaler  Commonly known as:  PROVENTIL HFA;VENTOLIN HFA  Inhale 1 puff into the lungs every 6 (six) hours as needed for wheezing or shortness of breath.     azithromycin 500 MG tablet  Commonly known as:  ZITHROMAX  Take 1 tablet (500 mg total) by mouth daily.  Start taking on:  04/26/2013     cefpodoxime 200 MG tablet  Commonly known as:  VANTIN  Take 1 tablet (200 mg total) by mouth daily.  Start taking on:  04/26/2013     CENTRUM SILVER PO  Take 1 tablet by mouth daily.     divalproex 500 MG DR tablet  Commonly known as:  DEPAKOTE  Take 1,000 mg by mouth at bedtime.     fluticasone 50 MCG/ACT nasal spray  Commonly known as:  FLONASE  Place 2 sprays into both nostrils daily.     Fluticasone-Salmeterol 100-50 MCG/DOSE Aepb  Commonly known as:  ADVAIR  Inhale 1 puff into the lungs 2 (two) times daily.     furosemide 40 MG tablet  Commonly known as:  LASIX  Take 3 tablets (120 mg  total) by mouth 2 (two) times daily.  Start taking on:  04/26/2013     gabapentin 600 MG tablet  Commonly known as:  NEURONTIN  Take 1 tablet (600 mg total) by mouth at bedtime.     metoprolol succinate 100 MG 24 hr tablet  Commonly known as:  TOPROL-XL  Take 100 mg by mouth at bedtime. Take with or immediately following a meal.     omeprazole 20 MG capsule  Commonly known as:  PRILOSEC  Take 20 mg by mouth daily.     predniSONE 20 MG tablet  Commonly known as:  DELTASONE  Take 3 tablets (60 mg total) by mouth 2 (two) times daily with a meal. Take 60mg  two times a day x 2 days, then 40mg  two times a day x 2 days, then 20mg  two times a dayx 2 days, then 20mg  daily x 2 days  Start taking on:  04/26/2013     simvastatin 20 MG tablet  Commonly known as:  ZOCOR  Take 20  mg by mouth every evening.     ziprasidone 60 MG capsule  Commonly known as:  GEODON  Take 120 mg by mouth at bedtime.         The results of significant diagnostics from this hospitalization (including imaging, microbiology, ancillary and laboratory) are listed below for reference.    Significant Diagnostic Studies: Dg Chest 2 View (if Patient Has Fever And/or Copd)  04/23/2013   CLINICAL DATA:  Shortness of breath.  EXAM: CHEST  2 VIEW  COMPARISON:  01/17/2013.  FINDINGS: Low lung volumes. Cardiomegaly. Bilateral pulmonary opacities representing either early congestive failure or bilateral pneumonia. Negative osseous structures. Worsening aeration from priors.  IMPRESSION: Cardiomegaly with bilateral pulmonary opacities are new/increased from priors. Question bilateral pneumonia versus failure.   Electronically Signed   By: Davonna Belling M.D.   On: 04/23/2013 14:16     Microbiology: Recent Results (from the past 240 hour(s))  CULTURE, BLOOD (ROUTINE X 2)     Status: None   Collection Time    04/23/13  4:30 PM      Result Value Range Status   Specimen Description BLOOD LEFT ANTECUBITAL   Final   Special  Requests BOTTLES DRAWN AEROBIC AND ANAEROBIC 3 CC EA   Final   Culture  Setup Time     Final   Value: 04/23/2013 22:18     Performed at [facility]   Culture     Final   Value:        BLOOD CULTURE RECEIVED NO GROWTH TO DATE CULTURE WILL BE HELD FOR 5 DAYS BEFORE ISSUING A FINAL NEGATIVE REPORT     Performed at [facility]   Report Status PENDING   Incomplete  CULTURE, BLOOD (ROUTINE X 2)     Status: None   Collection Time    04/23/13  4:35 PM      Result Value Range Status   Specimen Description BLOOD BLOOD LEFT FOREARM   Final   Special Requests BOTTLES DRAWN AEROBIC AND ANAEROBIC 3 CC EA   Final   Culture  Setup Time     Final   Value: 04/23/2013 22:18     Performed at [facility]   Culture     Final   Value:        BLOOD CULTURE RECEIVED NO GROWTH TO DATE CULTURE WILL BE HELD FOR 5 DAYS BEFORE ISSUING A FINAL NEGATIVE REPORT     Performed at [facility]   Report Status PENDING   Incomplete  CULTURE, EXPECTORATED SPUTUM-ASSESSMENT     Status: None   Collection Time    04/24/13  4:15 PM      Result Value Range Status   Specimen Description SPUTUM   Final   Special Requests Immunocompromised   Final   Sputum evaluation     Final   Value: THIS SPECIMEN IS ACCEPTABLE. RESPIRATORY CULTURE REPORT TO FOLLOW.   Report Status 04/24/2013 FINAL   Final  CULTURE, RESPIRATORY (NON-EXPECTORATED)     Status: None   Collection Time    04/24/13  4:15 PM      Result Value Range Status   Specimen Description SPU   Final   Special Requests NONE   Final   Gram Stain     Final   Value: MODERATE WBC PRESENT, PREDOMINANTLY PMN     FEW SQUAMOUS EPITHELIAL CELLS PRESENT     RARE GRAM POSITIVE COCCI     IN PAIRS RARE YEAST  RARE GRAM POSITIVE RODS   Culture PENDING   Incomplete   Report Status PENDING   Incomplete     Labs: Basic Metabolic Panel:  Recent Labs Lab 04/23/13 1320 04/23/13 1445 04/23/13 1900 04/24/13 0410 04/25/13 0420  NA 136  --    --  139 135  K 6.1* 6.4*  --  4.8 4.2  CL 101  --   --  101 94*  CO2 28  --   --  32 29  GLUCOSE 116*  --   --  96 169*  BUN 37*  --   --  41* 49*  CREATININE 3.07*  --   --  3.12* 3.18*  CALCIUM 11.2*  --   --  10.4 9.9  MG  --   --  2.6*  --  2.3  PHOS  --  4.2 4.9* 6.0*  --    Liver Function Tests:  Recent Labs Lab 04/24/13 0410  ALBUMIN 2.6*   No results found for this basename: LIPASE, AMYLASE,  in the last 168 hours No results found for this basename: AMMONIA,  in the last 168 hours CBC:  Recent Labs Lab 04/19/13 1418 04/23/13 1320 04/24/13 0410  WBC  --  11.9* 8.9  HGB 11.2* 10.4* 9.1*  HCT  --  33.4* 30.4*  MCV  --  98.2 100.0  PLT  --  220 196   Cardiac Enzymes: No results found for this basename: CKTOTAL, CKMB, CKMBINDEX, TROPONINI,  in the last 168 hours BNP: No components found with this basename: POCBNP,  CBG:  Recent Labs Lab 04/25/13 0303  GLUCAP 120*    Time coordinating discharge:  Greater than 30 minutes  Signed:  Spiro Ausborn, DO [facility] Pager: [phone] 04/25/2013, 5:25 PM

## 2013-04-25 NOTE — Progress Notes (Signed)
  [facility] Progress Note    Subjective: Pt had questions about severity of CKD, told her she had stage4 almost stage 5 disease and she needs to have perm access placed. Pt agreeable   Exam  Blood pressure 124/71, pulse 72, temperature 98.4 F (36.9 C), temperature source Oral, resp. rate 20, height 5\' 2"  (1.575 m), weight 95.5 kg (210 lb 8.6 oz), SpO2 91.00%. Gen: alert, no distress Neck: no jvd Resp: rhonchi bilaterally and wheezes posterior - bilateral  Cardio: regular rate and rhythm, S1, S2 normal, no murmur, click, rub or gallop  GI: soft, non-tender; bowel sounds normal; no masses, no organomegaly  Extremities: edema gone Neurologic: alert, interactive  Assessment:  1. AKI/CKD: stable creat, eGFR 16 mL/min 2. Hypercalcemia- improved with diuresis, f/u as outpatient 3. Hyperkalemia- resolved 4. CAP- started on rocephin and azithromycin. 5. Dyspnea / pulm edema: resolved clinically 6. Primary hyperparathyroidism c/b secondary HPTH- (already had 2 adenomas removed) 7. Bipolar disorder- cont with meds 8. HTN- stable (No ACE/ARB) 9. Peripheral edema- resolved   Rec: OK for d/c from renal standpoint, increase po lasix to 120 bid at home. Ordered vein map and VVS to see pt today to arrange for outpatient perm access placement. Will sign off.     Vinson Moselle MD  pager [phone]    cell [phone]  04/25/2013, 2:34 PM   Recent Labs Lab 04/23/13 1320 04/23/13 1445 04/23/13 1900 04/24/13 0410 04/25/13 0420  NA 136  --   --  139 135  K 6.1* 6.4*  --  4.8 4.2  CL 101  --   --  101 94*  CO2 28  --   --  32 29  GLUCOSE 116*  --   --  96 169*  BUN 37*  --   --  41* 49*  CREATININE 3.07*  --   --  3.12* 3.18*  CALCIUM 11.2*  --   --  10.4 9.9  PHOS  --  4.2 4.9* 6.0*  --     Recent Labs Lab 04/24/13 0410  ALBUMIN 2.6*    Recent Labs Lab 04/19/13 1418 04/23/13 1320 04/24/13 0410  WBC  --  11.9* 8.9  HGB 11.2* 10.4* 9.1*  HCT  --  33.4* 30.4*   MCV  --  98.2 100.0  PLT  --  220 196   . albuterol  2.5 mg Nebulization QID  . azithromycin  500 mg Intravenous Q24H  . cefTRIAXone (ROCEPHIN)  IV  1 g Intravenous Q24H  . dextromethorphan-guaiFENesin  1 tablet Oral BID  . divalproex  1,000 mg Oral QHS  . fluticasone  2 spray Each Nare Daily  . furosemide  80 mg Intravenous Q8H  . gabapentin  1,200 mg Oral QHS  . ipratropium  0.5 mg Nebulization Q6H WA  . methylPREDNISolone (SOLU-MEDROL) injection  60 mg Intravenous Q8H  . metoprolol succinate  100 mg Oral QHS  . mometasone-formoterol  2 puff Inhalation BID  . nicotine  14 mg Transdermal Daily  . pantoprazole  40 mg Oral Daily  . simvastatin  20 mg Oral QPM  . sodium chloride  3 mL Intravenous Q12H  . sodium chloride  3 mL Intravenous Q12H  . ziprasidone  120 mg Oral QHS     sodium chloride, acetaminophen, albuterol, menthol-cetylpyridinium, ondansetron (ZOFRAN) IV, ondansetron, sodium chloride

## 2013-04-26 DIAGNOSIS — Z01810 Encounter for preprocedural cardiovascular examination: Secondary | ICD-10-CM

## 2013-04-26 DIAGNOSIS — N19 Unspecified kidney failure: Secondary | ICD-10-CM

## 2013-04-26 LAB — BASIC METABOLIC PANEL
BUN: 64 mg/dL — ABNORMAL HIGH (ref 6–23)
CO2: 29 mEq/L (ref 19–32)
Calcium: 9.9 mg/dL (ref 8.4–10.5)
Chloride: 95 mEq/L — ABNORMAL LOW (ref 96–112)
Creatinine, Ser: 3.6 mg/dL — ABNORMAL HIGH (ref 0.50–1.10)
GFR calc Af Amer: 16 mL/min — ABNORMAL LOW (ref >90)
GFR calc non Af Amer: 13 mL/min — ABNORMAL LOW (ref >90)
Glucose, Bld: 131 mg/dL — ABNORMAL HIGH (ref 70–99)
Potassium: 3.8 mEq/L (ref 3.5–5.1)
Sodium: 136 mEq/L (ref 135–145)

## 2013-04-26 MED ORDER — ALBUTEROL SULFATE (5 MG/ML) 0.5% IN NEBU: 2.5000 mg | INHALATION_SOLUTION | RESPIRATORY_TRACT | Status: DC | PRN

## 2013-04-26 MED ORDER — FLUTICASONE-SALMETEROL 100-50 MCG/DOSE IN AEPB: 1.0000 | INHALATION_SPRAY | Freq: Two times a day (BID) | RESPIRATORY_TRACT | Status: DC

## 2013-04-26 MED ORDER — CEFPODOXIME PROXETIL 200 MG PO TABS: 200.0000 mg | ORAL_TABLET | Freq: Every day | ORAL | Status: DC

## 2013-04-26 MED ORDER — GABAPENTIN 600 MG PO TABS: 600.0000 mg | ORAL_TABLET | Freq: Every day | ORAL | Status: DC

## 2013-04-26 MED ORDER — AZITHROMYCIN 500 MG PO TABS: 500.0000 mg | ORAL_TABLET | ORAL | Status: DC

## 2013-04-26 MED ORDER — SIMVASTATIN 20 MG PO TABS: 20.0000 mg | ORAL_TABLET | Freq: Every evening | ORAL | Status: DC

## 2013-04-26 MED ORDER — PREDNISONE 20 MG PO TABS: 60.0000 mg | ORAL_TABLET | Freq: Two times a day (BID) | ORAL | Status: DC

## 2013-04-26 MED ORDER — ALBUTEROL SULFATE HFA 108 (90 BASE) MCG/ACT IN AERS: 1.0000 | INHALATION_SPRAY | Freq: Four times a day (QID) | RESPIRATORY_TRACT | Status: DC | PRN

## 2013-04-26 NOTE — Progress Notes (Signed)
Patient seen and examined. Has been able to urinate. Agree with DC home today as previously planned by Dr. Arbutus Leas. No changes to DC summary.  Peggye Pitt, MD [facility] Pager: [phone]

## 2013-04-26 NOTE — Progress Notes (Signed)
Pt given WISH-Weigh In Stay Healthy program/Free Scale voucher with instructions to redeem  From [employer] here at [employer].

## 2013-04-26 NOTE — Progress Notes (Addendum)
Patient desaturated to 86% on room air at rest. Saturation on oxygen 2L is 94%. Erskin Burnet RN

## 2013-04-26 NOTE — Progress Notes (Signed)
VASCULAR LAB PRELIMINARY  PRELIMINARY  PRELIMINARY  PRELIMINARY  Right  Upper Extremity Vein Map    Cephalic  Segment Diameter Depth Comment  1. Axilla 3.6 mm mm   2. Mid upper arm 2.4 mm mm   3. Above AC 2.3 mm mm Thrombosed   4. In AC 4.7 mm mm Thrombosed, branch  5. Below AC 1.5 mm mm   6. Mid forearm 1.0 mm mm   7. Wrist 1.1 mm mm    mm mm    mm mm    mm mm    Basilic  Segment Diameter Depth Comment  1. Mid upper arm 5.3 mm 1.3 mm   2. Above AC 3 mm 1.4 mm   3. In AC 2.9 mm 1.1 mm   4. Below AC 3.2 mm 3.7 mm branch   mm mm    mm mm    mm mm    mm mm    mm mm    mm mm     Left Upper Extremity Vein Map    Cephalic  Segment Diameter Depth Comment  1. Axilla mm mm Too small to measure  2. Mid upper arm 1.3 mm mm   3. Above AC 2 mm mm   4. In AC 3.8 mm mm branch  5. Below AC 2.6 mm mm Branch, tortuous, thrombosed  6. Mid forearm 1.3 mm mm   7. Wrist 1.2 mm mm    mm mm    mm mm    mm mm    Basilic  Segment Diameter Depth Comment  1. Mid upper arm 3.3 mm 1.5 mm   2. Above AC 2.8 mm 1.6 mm   3. In AC 2.2 mm 7.7 mm   4. Below AC 1.1 mm 1.7 mm    mm mm    mm mm    mm mm    mm mm    mm mm    mm mm     Asante Ritacco, RVT 04/26/2013, 10:59 AM

## 2013-04-26 NOTE — Progress Notes (Signed)
Pt had trouble urinating this morning. Bladder scanned her and got 617cc. MD notified order to in and out cathh. Had a urine output of 1000cc. Will continue  To monitor closely.

## 2013-04-27 LAB — CULTURE, RESPIRATORY W GRAM STAIN: Culture: NORMAL

## 2013-04-29 ENCOUNTER — Telehealth: Payer: Self-pay | Admitting: Surgery

## 2013-04-29 LAB — CULTURE, BLOOD (ROUTINE X 2)
Culture: NO GROWTH
Culture: NO GROWTH

## 2013-04-29 LAB — VITAMIN D 1,25 DIHYDROXY
Vitamin D 1, 25 (OH)2 Total: 26 pg/mL (ref 18–72)
Vitamin D2 1, 25 (OH)2: <8 pg/mL
Vitamin D3 1, 25 (OH)2: 26 pg/mL

## 2013-04-29 NOTE — Telephone Encounter (Addendum)
Message copied by Fredrich Birks on Mon Apr 29, 2013  2:58 PM ------      Message from: Melene Plan      Created: Thu Apr 25, 2013  3:50 PM       Per Dr VWB;please make her an office visit with next available. Referred by Dr Arlean Hopping for ESRD. Needs Permanent access. Needs vein map if not done in hospital. ------  04/29/13: left detailed message with patient on voicemail, dm

## 2013-05-06 ENCOUNTER — Encounter (HOSPITAL_COMMUNITY): Payer: Self-pay

## 2013-05-13 ENCOUNTER — Encounter: Payer: Self-pay | Admitting: Vascular Surgery

## 2013-05-14 ENCOUNTER — Ambulatory Visit: Payer: Self-pay | Admitting: Vascular Surgery

## 2013-05-23 DIAGNOSIS — F3132 Bipolar disorder, current episode depressed, moderate: Secondary | ICD-10-CM | POA: Diagnosis not present

## 2013-05-23 DIAGNOSIS — F4310 Post-traumatic stress disorder, unspecified: Secondary | ICD-10-CM | POA: Diagnosis not present

## 2013-06-10 ENCOUNTER — Encounter: Payer: Self-pay | Admitting: Vascular Surgery

## 2013-06-11 ENCOUNTER — Ambulatory Visit (INDEPENDENT_AMBULATORY_CARE_PROVIDER_SITE_OTHER): Payer: Medicaid Other | Admitting: Vascular Surgery

## 2013-06-11 ENCOUNTER — Encounter (HOSPITAL_COMMUNITY): Payer: Self-pay | Admitting: *Deleted

## 2013-06-11 ENCOUNTER — Encounter: Payer: Self-pay | Admitting: Vascular Surgery

## 2013-06-11 ENCOUNTER — Other Ambulatory Visit: Payer: Self-pay

## 2013-06-11 VITALS — BP 128/91 | HR 71 | Ht 62.0 in | Wt 213.0 lb

## 2013-06-11 DIAGNOSIS — N186 End stage renal disease: Secondary | ICD-10-CM

## 2013-06-11 MED ORDER — DEXTROSE 5 % IV SOLN
1.5000 g | INTRAVENOUS | Status: AC
  Administered 2013-06-12: 1.5 g via INTRAVENOUS
  Filled 2013-06-11: qty 1.5

## 2013-06-11 MED ORDER — SODIUM CHLORIDE 0.9 % IV SOLN
INTRAVENOUS | Status: DC
  Administered 2013-06-12 (×2): via INTRAVENOUS

## 2013-06-11 NOTE — Progress Notes (Signed)
Subjective:     Patient ID: [facility], female   DOB: 03/24/1961, 52 y.o.   MRN: [id number]  HPI this 52-year-old female was referred for evaluation for vascular access by Dr Powell. Patient has not started on hemodialysis. She states that the reason for her kidney failure is due to lithium medication. She does not have diabetes mellitus. She is right-handed.  Past Medical History  Diagnosis Date  . Mental disorder   . Depression   . Hypertension   . Diabetes mellitus without complication   . Overdose   . Tobacco use disorder 11/13/2012    History  Substance Use Topics  . Smoking status: Current Every Day Smoker -- 0.50 packs/day for 40 years    Types: Cigarettes  . Smokeless tobacco: Never Used  . Alcohol Use: Yes     Comment: social    Family History  Problem Relation Age of Onset  . Diabetes Mother   . Hyperlipidemia Mother   . Hypertension Mother   . Diabetes Father   . Hypertension Father   . Hyperlipidemia Father     Allergies  Allergen Reactions  . Codeine Sulfate Anaphylaxis    Daughter called about having this allergy   . Haldol [Haloperidol Lactate] Shortness Of Breath  . Risperidone And Related Shortness Of Breath    Current outpatient prescriptions:albuterol (PROVENTIL HFA;VENTOLIN HFA) 108 (90 BASE) MCG/ACT inhaler, Inhale 1 puff into the lungs every 6 (six) hours as needed for wheezing or shortness of breath., Disp: 1 Inhaler, Rfl: 0;  albuterol (PROVENTIL) (5 MG/ML) 0.5% nebulizer solution, Take 0.5 mLs (2.5 mg total) by nebulization every 4 (four) hours as needed for wheezing or shortness of breath., Disp: 20 mL, Rfl: 12 azithromycin (ZITHROMAX) 500 MG tablet, Take 1 tablet (500 mg total) by mouth daily., Disp: 4 tablet, Rfl: 0;  cefpodoxime (VANTIN) 200 MG tablet, Take 1 tablet (200 mg total) by mouth daily., Disp: 4 tablet, Rfl: 0;  divalproex (DEPAKOTE) 500 MG DR tablet, Take 1,000 mg by mouth at bedtime. , Disp: , Rfl: ;  fluticasone (FLONASE) 50  MCG/ACT nasal spray, Place 2 sprays into both nostrils daily., Disp: , Rfl:  Fluticasone-Salmeterol (ADVAIR) 100-50 MCG/DOSE AEPB, Inhale 1 puff into the lungs 2 (two) times daily., Disp: 1 each, Rfl: 0;  furosemide (LASIX) 40 MG tablet, Take 3 tablets (120 mg total) by mouth 2 (two) times daily., Disp: 180 tablet, Rfl: 0;  gabapentin (NEURONTIN) 600 MG tablet, Take 1 tablet (600 mg total) by mouth at bedtime., Disp: 30 tablet, Rfl: 0 metoprolol succinate (TOPROL-XL) 100 MG 24 hr tablet, Take 100 mg by mouth at bedtime. Take with or immediately following a meal., Disp: , Rfl: ;  Multiple Vitamins-Minerals (CENTRUM SILVER PO), Take 1 tablet by mouth daily., Disp: , Rfl: ;  omeprazole (PRILOSEC) 20 MG capsule, Take 20 mg by mouth daily. , Disp: , Rfl:  predniSONE (DELTASONE) 20 MG tablet, Take 3 tablets (60 mg total) by mouth 2 (two) times daily with a meal. Take 60mg two times a day x 2 days, then 40mg two times a day x 2 days, then 20mg two times a dayx 2 days, then 20mg daily x 2 days, Disp: 26 tablet, Rfl: 0;  simvastatin (ZOCOR) 20 MG tablet, Take 1 tablet (20 mg total) by mouth every evening., Disp: 30 tablet, Rfl: 0 ziprasidone (GEODON) 60 MG capsule, Take 120 mg by mouth at bedtime., Disp: , Rfl:   BP 128/91  Pulse 71  Ht 5' 2" (1.575   m)  Wt 213 lb (96.616 kg)  BMI 38.95 kg/m2  SpO2 100%  Body mass index is 38.95 kg/(m^2).          Review of Systems denies chest pain, dyspnea on exertion, PND, orthopnea, hemoptysis, does complain of occasional wheezing from COPD. Other systems negative and complete review of systems     Objective:   Physical Exam BP 128/91  Pulse 71  Ht 5\' 2"  (1.575 m)  Wt 213 lb (96.616 kg)  BMI 38.95 kg/m2  SpO2 100%  Gen.-alert and oriented x3 in no apparent distress HEENT normal for age Lungs no rhonchi or wheezing Cardiovascular regular rhythm no murmurs carotid pulses 3+ palpable no bruits audible Abdomen soft nontender no palpable  masses Musculoskeletal free of  major deformities Skin clear -no rashes Neurologic normal Lower extremities 3+ femoral and dorsalis pedis pulses palpable bilaterally with no edema  I reviewed the results of the vein mapping which was done at the hospital on 04/26/2013. This reveals the veins in the right upper treatment to be larger than the left with possibility of fistula in the brachial cephalic location or basilic vein transposition. I independently performed an ultrasound examination of both upper extremities with the SonoSite today and concur with the above findings at the right upper extremity is the preferred arm       Assessment:     Chronic renal insufficiency-stage IV-needs vascular access    Plan:     Plan creation right arm AV fistula tomorrow-brachial cephalic versus basilic vein transposition Discussed with patient the fact that this may not mature into satisfactory site and they were need revision or another fistula. She does understand this and would like to proceed tomorrow

## 2013-06-12 ENCOUNTER — Encounter (HOSPITAL_COMMUNITY): Payer: Medicare Other | Admitting: Certified Registered Nurse Anesthetist

## 2013-06-12 ENCOUNTER — Encounter (HOSPITAL_COMMUNITY): Payer: Self-pay | Admitting: Pharmacy Technician

## 2013-06-12 ENCOUNTER — Encounter (HOSPITAL_COMMUNITY)
Admission: RE | Disposition: A | Discharge status: Home / Self Care | Payer: Self-pay | Source: Ambulatory Visit | Attending: Vascular Surgery

## 2013-06-12 ENCOUNTER — Encounter (HOSPITAL_COMMUNITY): Payer: Self-pay | Admitting: *Deleted

## 2013-06-12 ENCOUNTER — Other Ambulatory Visit: Payer: Self-pay | Admitting: *Deleted

## 2013-06-12 ENCOUNTER — Ambulatory Visit (HOSPITAL_COMMUNITY): Payer: Medicare Other

## 2013-06-12 ENCOUNTER — Ambulatory Visit (HOSPITAL_COMMUNITY): Payer: Medicare Other | Admitting: Certified Registered Nurse Anesthetist

## 2013-06-12 ENCOUNTER — Ambulatory Visit (HOSPITAL_COMMUNITY)
Admission: RE | Admit: 2013-06-12 | Discharge: 2013-06-12 | Disposition: A | Discharge status: Home / Self Care | Payer: Medicare Other | Source: Ambulatory Visit | Attending: Vascular Surgery | Admitting: Vascular Surgery

## 2013-06-12 DIAGNOSIS — Z9981 Dependence on supplemental oxygen: Secondary | ICD-10-CM | POA: Diagnosis not present

## 2013-06-12 DIAGNOSIS — F319 Bipolar disorder, unspecified: Secondary | ICD-10-CM | POA: Diagnosis not present

## 2013-06-12 DIAGNOSIS — E119 Type 2 diabetes mellitus without complications: Secondary | ICD-10-CM | POA: Diagnosis not present

## 2013-06-12 DIAGNOSIS — K219 Gastro-esophageal reflux disease without esophagitis: Secondary | ICD-10-CM | POA: Insufficient documentation

## 2013-06-12 DIAGNOSIS — I120 Hypertensive chronic kidney disease with stage 5 chronic kidney disease or end stage renal disease: Secondary | ICD-10-CM | POA: Insufficient documentation

## 2013-06-12 DIAGNOSIS — N186 End stage renal disease: Secondary | ICD-10-CM

## 2013-06-12 DIAGNOSIS — J4489 Other specified chronic obstructive pulmonary disease: Secondary | ICD-10-CM | POA: Insufficient documentation

## 2013-06-12 DIAGNOSIS — F17200 Nicotine dependence, unspecified, uncomplicated: Secondary | ICD-10-CM | POA: Diagnosis not present

## 2013-06-12 DIAGNOSIS — J449 Chronic obstructive pulmonary disease, unspecified: Secondary | ICD-10-CM | POA: Insufficient documentation

## 2013-06-12 DIAGNOSIS — I10 Essential (primary) hypertension: Secondary | ICD-10-CM | POA: Diagnosis not present

## 2013-06-12 DIAGNOSIS — Z4931 Encounter for adequacy testing for hemodialysis: Secondary | ICD-10-CM

## 2013-06-12 DIAGNOSIS — R0602 Shortness of breath: Secondary | ICD-10-CM | POA: Diagnosis not present

## 2013-06-12 HISTORY — DX: Other complications of anesthesia, initial encounter: T88.59XA

## 2013-06-12 HISTORY — DX: Unspecified asthma, uncomplicated: J45.909

## 2013-06-12 HISTORY — DX: Gastro-esophageal reflux disease without esophagitis: K21.9

## 2013-06-12 HISTORY — DX: Personal history of other medical treatment: Z92.89

## 2013-06-12 HISTORY — DX: Cardiac murmur, unspecified: R01.1

## 2013-06-12 HISTORY — DX: Adverse effect of unspecified anesthetic, initial encounter: T41.45XA

## 2013-06-12 HISTORY — DX: Chronic kidney disease, unspecified: N18.9

## 2013-06-12 HISTORY — DX: Unspecified convulsions: R56.9

## 2013-06-12 HISTORY — PX: AV FISTULA PLACEMENT: SHX1204

## 2013-06-12 HISTORY — DX: Chronic obstructive pulmonary disease, unspecified: J44.9

## 2013-06-12 HISTORY — DX: Shortness of breath: R06.02

## 2013-06-12 HISTORY — DX: Post-traumatic stress disorder, unspecified: F43.10

## 2013-06-12 LAB — POCT I-STAT 4, (NA,K, GLUC, HGB,HCT)
Glucose, Bld: 80 mg/dL (ref 70–99)
HCT: 38 % (ref 36.0–46.0)
Hemoglobin: 12.9 g/dL (ref 12.0–15.0)
Potassium: 4.1 mEq/L (ref 3.7–5.3)
Sodium: 141 mEq/L (ref 137–147)

## 2013-06-12 LAB — GLUCOSE, CAPILLARY: Glucose-Capillary: 86 mg/dL (ref 70–99)

## 2013-06-12 SURGERY — ARTERIOVENOUS (AV) FISTULA CREATION
Anesthesia: General | Site: Arm Upper | Laterality: Right

## 2013-06-12 MED ORDER — FENTANYL CITRATE 0.05 MG/ML IJ SOLN: 25.0000 ug | INTRAMUSCULAR | Status: DC | PRN

## 2013-06-12 MED ORDER — ONDANSETRON HCL 4 MG/2ML IJ SOLN
INTRAMUSCULAR | Status: AC
  Filled 2013-06-12: qty 2

## 2013-06-12 MED ORDER — ARTIFICIAL TEARS OP OINT
TOPICAL_OINTMENT | OPHTHALMIC | Status: DC | PRN
  Administered 2013-06-12: 1 via OPHTHALMIC

## 2013-06-12 MED ORDER — ONDANSETRON HCL 4 MG/2ML IJ SOLN
INTRAMUSCULAR | Status: DC | PRN
  Administered 2013-06-12: 4 mg via INTRAVENOUS

## 2013-06-12 MED ORDER — HYDROMORPHONE HCL PF 1 MG/ML IJ SOLN
INTRAMUSCULAR | Status: AC
  Filled 2013-06-12: qty 1

## 2013-06-12 MED ORDER — OXYCODONE HCL 5 MG/5ML PO SOLN: 5.0000 mg | Freq: Once | ORAL | Status: AC | PRN

## 2013-06-12 MED ORDER — GLYCOPYRROLATE 0.2 MG/ML IJ SOLN
INTRAMUSCULAR | Status: DC | PRN
  Administered 2013-06-12: 1 mg via INTRAVENOUS

## 2013-06-12 MED ORDER — MEPERIDINE HCL 25 MG/ML IJ SOLN: 6.2500 mg | INTRAMUSCULAR | Status: DC | PRN

## 2013-06-12 MED ORDER — NEOSTIGMINE METHYLSULFATE 1 MG/ML IJ SOLN
INTRAMUSCULAR | Status: DC | PRN
  Administered 2013-06-12: 5 mg via INTRAVENOUS

## 2013-06-12 MED ORDER — GLYCOPYRROLATE 0.2 MG/ML IJ SOLN
INTRAMUSCULAR | Status: AC
  Filled 2013-06-12: qty 5

## 2013-06-12 MED ORDER — HEPARIN SODIUM (PORCINE) 1000 UNIT/ML IJ SOLN
INTRAMUSCULAR | Status: AC
  Filled 2013-06-12: qty 1

## 2013-06-12 MED ORDER — ALBUTEROL SULFATE HFA 108 (90 BASE) MCG/ACT IN AERS
INHALATION_SPRAY | RESPIRATORY_TRACT | Status: DC | PRN
  Administered 2013-06-12: 6 via RESPIRATORY_TRACT

## 2013-06-12 MED ORDER — FENTANYL CITRATE 0.05 MG/ML IJ SOLN
INTRAMUSCULAR | Status: AC
  Filled 2013-06-12: qty 5

## 2013-06-12 MED ORDER — FENTANYL CITRATE 0.05 MG/ML IJ SOLN
INTRAMUSCULAR | Status: AC
  Administered 2013-06-12: 25 ug
  Filled 2013-06-12: qty 2

## 2013-06-12 MED ORDER — FENTANYL CITRATE 0.05 MG/ML IJ SOLN
INTRAMUSCULAR | Status: DC | PRN
  Administered 2013-06-12 (×2): 25 ug via INTRAVENOUS
  Administered 2013-06-12: 50 ug via INTRAVENOUS

## 2013-06-12 MED ORDER — EPHEDRINE SULFATE 50 MG/ML IJ SOLN
INTRAMUSCULAR | Status: DC | PRN
  Administered 2013-06-12 (×4): 5 mg via INTRAVENOUS

## 2013-06-12 MED ORDER — OXYCODONE HCL 5 MG PO TABS: 5.0000 mg | ORAL_TABLET | Freq: Four times a day (QID) | ORAL | Status: DC | PRN

## 2013-06-12 MED ORDER — ROCURONIUM BROMIDE 50 MG/5ML IV SOLN
INTRAVENOUS | Status: AC
  Filled 2013-06-12: qty 1

## 2013-06-12 MED ORDER — ROCURONIUM BROMIDE 100 MG/10ML IV SOLN
INTRAVENOUS | Status: DC | PRN
  Administered 2013-06-12: 30 mg via INTRAVENOUS

## 2013-06-12 MED ORDER — LIDOCAINE HCL (CARDIAC) 20 MG/ML IV SOLN
INTRAVENOUS | Status: DC | PRN
  Administered 2013-06-12: 20 mg via INTRAVENOUS

## 2013-06-12 MED ORDER — OXYCODONE HCL 5 MG PO TABS
5.0000 mg | ORAL_TABLET | Freq: Once | ORAL | Status: AC | PRN
  Administered 2013-06-12: 5 mg via ORAL

## 2013-06-12 MED ORDER — ALBUTEROL SULFATE HFA 108 (90 BASE) MCG/ACT IN AERS
INHALATION_SPRAY | RESPIRATORY_TRACT | Status: AC
  Filled 2013-06-12: qty 6.7

## 2013-06-12 MED ORDER — LIDOCAINE HCL (CARDIAC) 20 MG/ML IV SOLN
INTRAVENOUS | Status: AC
  Filled 2013-06-12: qty 5

## 2013-06-12 MED ORDER — MIDAZOLAM HCL 2 MG/2ML IJ SOLN: 0.5000 mg | Freq: Once | INTRAMUSCULAR | Status: DC | PRN

## 2013-06-12 MED ORDER — SODIUM CHLORIDE 0.9 % IR SOLN
Status: DC | PRN
  Administered 2013-06-12: 11:00:00

## 2013-06-12 MED ORDER — PROPOFOL 10 MG/ML IV BOLUS
INTRAVENOUS | Status: AC
  Filled 2013-06-12: qty 20

## 2013-06-12 MED ORDER — 0.9 % SODIUM CHLORIDE (POUR BTL) OPTIME
TOPICAL | Status: DC | PRN
  Administered 2013-06-12: 1000 mL

## 2013-06-12 MED ORDER — MIDAZOLAM HCL 2 MG/2ML IJ SOLN
INTRAMUSCULAR | Status: AC
  Filled 2013-06-12: qty 2

## 2013-06-12 MED ORDER — PROPOFOL 10 MG/ML IV BOLUS
INTRAVENOUS | Status: DC | PRN
  Administered 2013-06-12: 100 mg via INTRAVENOUS

## 2013-06-12 MED ORDER — OXYCODONE HCL 5 MG PO TABS
ORAL_TABLET | ORAL | Status: AC
  Filled 2013-06-12: qty 1

## 2013-06-12 SURGICAL SUPPLY — 43 items
ARMBAND PINK RESTRICT EXTREMIT (MISCELLANEOUS) ×2 IMPLANT
CANISTER SUCTION 2500CC (MISCELLANEOUS) ×2 IMPLANT
CATH EMB 3FR 80CM (CATHETERS) ×2 IMPLANT
CLIP TI MEDIUM 6 (CLIP) ×4 IMPLANT
CLIP TI WIDE RED SMALL 6 (CLIP) ×6 IMPLANT
COVER PROBE W GEL 5X96 (DRAPES) ×2 IMPLANT
COVER SURGICAL LIGHT HANDLE (MISCELLANEOUS) ×2 IMPLANT
DERMABOND ADHESIVE PROPEN (GAUZE/BANDAGES/DRESSINGS) ×2
DERMABOND ADVANCED (GAUZE/BANDAGES/DRESSINGS) ×1
DERMABOND ADVANCED .7 DNX12 (GAUZE/BANDAGES/DRESSINGS) ×1 IMPLANT
DERMABOND ADVANCED .7 DNX6 (GAUZE/BANDAGES/DRESSINGS) ×2 IMPLANT
ELECT REM PT RETURN 9FT ADLT (ELECTROSURGICAL) ×2
ELECTRODE REM PT RTRN 9FT ADLT (ELECTROSURGICAL) ×1 IMPLANT
GEL ULTRASOUND 20GR AQUASONIC (MISCELLANEOUS) IMPLANT
GLOVE BIOGEL PI IND STRL 6.5 (GLOVE) ×1 IMPLANT
GLOVE BIOGEL PI INDICATOR 6.5 (GLOVE) ×1
GLOVE ECLIPSE 6.5 STRL STRAW (GLOVE) ×2 IMPLANT
GLOVE SS BIOGEL STRL SZ 7 (GLOVE) ×1 IMPLANT
GLOVE SUPERSENSE BIOGEL SZ 7 (GLOVE) ×1
GLOVE SURG SS PI 7.0 STRL IVOR (GLOVE) ×4 IMPLANT
GOWN STRL REUS W/ TWL LRG LVL3 (GOWN DISPOSABLE) ×3 IMPLANT
GOWN STRL REUS W/ TWL XL LVL3 (GOWN DISPOSABLE) ×2 IMPLANT
GOWN STRL REUS W/TWL LRG LVL3 (GOWN DISPOSABLE) ×3
GOWN STRL REUS W/TWL XL LVL3 (GOWN DISPOSABLE) ×2
KIT BASIN OR (CUSTOM PROCEDURE TRAY) ×2 IMPLANT
KIT ROOM TURNOVER OR (KITS) ×2 IMPLANT
NEEDLE HYPO 25GX1X1/2 BEV (NEEDLE) ×2 IMPLANT
NS IRRIG 1000ML POUR BTL (IV SOLUTION) ×2 IMPLANT
PACK CV ACCESS (CUSTOM PROCEDURE TRAY) ×2 IMPLANT
PAD ARMBOARD 7.5X6 YLW CONV (MISCELLANEOUS) ×4 IMPLANT
SPONGE LAP 18X18 X RAY DECT (DISPOSABLE) ×2 IMPLANT
SUT PROLENE 6 0 BV (SUTURE) ×4 IMPLANT
SUT SILK 2 0 SH (SUTURE) ×2 IMPLANT
SUT SILK 3 0 (SUTURE) ×1
SUT SILK 3-0 18XBRD TIE 12 (SUTURE) ×1 IMPLANT
SUT VIC AB 3-0 SH 27 (SUTURE) ×4
SUT VIC AB 3-0 SH 27X BRD (SUTURE) ×4 IMPLANT
SUT VICRYL 4-0 PS2 18IN ABS (SUTURE) ×4 IMPLANT
SYR TB 1ML LUER SLIP (SYRINGE) ×2 IMPLANT
TOWEL OR 17X24 6PK STRL BLUE (TOWEL DISPOSABLE) ×2 IMPLANT
TOWEL OR 17X26 10 PK STRL BLUE (TOWEL DISPOSABLE) ×2 IMPLANT
UNDERPAD 30X30 INCONTINENT (UNDERPADS AND DIAPERS) ×2 IMPLANT
WATER STERILE IRR 1000ML POUR (IV SOLUTION) ×2 IMPLANT

## 2013-06-12 NOTE — Interval H&P Note (Signed)
History and Physical Interval Note:  06/12/2013 10:45 AM  [facility]  has presented today for surgery, with the diagnosis of End Stage Renal Disease  The various methods of treatment have been discussed with the patient and family. After consideration of risks, benefits and other options for treatment, the patient has consented to  Procedure(s): ARTERIOVENOUS (AV) FISTULA CREATION; RIGHT UPPER EXTREMITY AVF; POSSIBLE BASILIC VEIN TRANSPOSITION (Right) as a surgical intervention .  The patient's history has been reviewed, patient examined, no change in status, stable for surgery.  I have reviewed the patient's chart and labs.  Questions were answered to the patient's satisfaction.     Tinnie Gens

## 2013-06-12 NOTE — Discharge Instructions (Signed)
° ° °  06/12/2013 YSIDRA SOPHER [id number] 1960-08-01  Surgeon(s): Mal [name], MD  Procedure(s): ARTERIOVENOUS (AV) FISTULA CREATION; RIGHT  BASILIC VEIN TRANSPOSITION   x Do not stick graft for 12 weeks

## 2013-06-12 NOTE — Preoperative (Signed)
Beta Blockers   Reason not to administer Beta Blockers:pt states she took scheduled metoprolo 06-11-13 in the PM

## 2013-06-12 NOTE — Op Note (Signed)
OPERATIVE REPORT  Date of Surgery: 06/12/2013  Surgeon: Tinnie Gens, MD  Assistant: Dionicio Stall  Pre-op Diagnosis: End Stage Renal Disease  Post-op Diagnosis: end stage renal disease  Procedure: Procedure(s): #1 exploration of right cephalic vein for potential brachial cephalic AV fistula-inadequate vein #2 ultrasound localization right basilic vein #3 basilic vein transposition right arm  Anesthesia: Gen.-endotracheal  EBL: Minimal  Complications: None  Procedure Details: Patient was taken the operating room placed in the supine position at which time satisfactory general endotracheal anesthesia was administered. Both the cephalic and basilic veins were imaged using the SonoSite ultrasound prior to prepping and draping. Basilic vein appeared satisfactory for basilic vein transposition of cephalic vein appeared potentially satisfactory consistent with preoperative vein mapping. X-Prep and draped in routine sterile manner a short transverse incision was made over the cephalic vein in the right upper extremity. Vein was dissected free proximally and distally. It was ligated distally and transected. The tip was made to gently dilated with heparin saline but the vein appeared slightly dissected. A 2 dilator would traverse the vein proximally and a 2-1/2 but larger than this would not traverse the vein. Attempted to inject heparin saline and appeared to be extravasating therefore we declared that the vein was not adequate for fistula creation. Therefore the basilic vein was identified from the proximal forearm to the axilla through 3 separate incisions on the medial aspect of the right arm. Branches ligated 3 and 4-0 silk ties and divided it was ligated distally transected and gently dilated with heparinized saline. An excellent vein being at least 3 mm in size throughout about 5 mm in size near the axilla. Brachial artery was exposed in the distal upper arm incision had an excellent pulse  was relatively small vessel. Using a curved tunneler the basilic vein was tunneled on the anterior aspect of the arm a curvilinear fashion delivered and the distal wound brachial artery occluded proximally and distally with Vesseloops a 15 blade extended with Potts scissors. Silicone was then slightly spatulated and anastomosed end to side with 6-0 Prolene. Vessel loops were then released there was an excellent pulse and thrill in the fistula. There was slight diminution in flow in the radial ulnar arteries distally with the fistula open which improved with compression of the fistula. Adequate hemostasis was achieved no heparin was given wounds were closed in layers with Vicryl in subcuticular fashion and Dermabond patient to the recovery room in stable condition   Tinnie Gens, MD 06/12/2013 1:59 PM

## 2013-06-12 NOTE — H&P (View-Only) (Signed)
Subjective:     Patient ID: [facility], female   DOB: 10-23-1960, 53 y.o.   MRN: [id number]  HPI this 53 year old female was referred for evaluation for vascular access by Dr Florene Glen. Patient has not started on hemodialysis. She states that the reason for her kidney failure is due to lithium medication. She does not have diabetes mellitus. She is right-handed.  Past Medical History  Diagnosis Date  . Mental disorder   . Depression   . Hypertension   . Diabetes mellitus without complication   . Overdose   . Tobacco use disorder 11/13/2012    History  Substance Use Topics  . Smoking status: Current Every Day Smoker -- 0.50 packs/day for 40 years    Types: Cigarettes  . Smokeless tobacco: Never Used  . Alcohol Use: Yes     Comment: social    Family History  Problem Relation Age of Onset  . Diabetes Mother   . Hyperlipidemia Mother   . Hypertension Mother   . Diabetes Father   . Hypertension Father   . Hyperlipidemia Father     Allergies  Allergen Reactions  . Codeine Sulfate Anaphylaxis    Daughter called about having this allergy   . Haldol [Haloperidol Lactate] Shortness Of Breath  . Risperidone And Related Shortness Of Breath    Current outpatient prescriptions:albuterol (PROVENTIL HFA;VENTOLIN HFA) 108 (90 BASE) MCG/ACT inhaler, Inhale 1 puff into the lungs every 6 (six) hours as needed for wheezing or shortness of breath., Disp: 1 Inhaler, Rfl: 0;  albuterol (PROVENTIL) (5 MG/ML) 0.5% nebulizer solution, Take 0.5 mLs (2.5 mg total) by nebulization every 4 (four) hours as needed for wheezing or shortness of breath., Disp: 20 mL, Rfl: 12 azithromycin (ZITHROMAX) 500 MG tablet, Take 1 tablet (500 mg total) by mouth daily., Disp: 4 tablet, Rfl: 0;  cefpodoxime (VANTIN) 200 MG tablet, Take 1 tablet (200 mg total) by mouth daily., Disp: 4 tablet, Rfl: 0;  divalproex (DEPAKOTE) 500 MG DR tablet, Take 1,000 mg by mouth at bedtime. , Disp: , Rfl: ;  fluticasone (FLONASE) 50  MCG/ACT nasal spray, Place 2 sprays into both nostrils daily., Disp: , Rfl:  Fluticasone-Salmeterol (ADVAIR) 100-50 MCG/DOSE AEPB, Inhale 1 puff into the lungs 2 (two) times daily., Disp: 1 each, Rfl: 0;  furosemide (LASIX) 40 MG tablet, Take 3 tablets (120 mg total) by mouth 2 (two) times daily., Disp: 180 tablet, Rfl: 0;  gabapentin (NEURONTIN) 600 MG tablet, Take 1 tablet (600 mg total) by mouth at bedtime., Disp: 30 tablet, Rfl: 0 metoprolol succinate (TOPROL-XL) 100 MG 24 hr tablet, Take 100 mg by mouth at bedtime. Take with or immediately following a meal., Disp: , Rfl: ;  Multiple Vitamins-Minerals (CENTRUM SILVER PO), Take 1 tablet by mouth daily., Disp: , Rfl: ;  omeprazole (PRILOSEC) 20 MG capsule, Take 20 mg by mouth daily. , Disp: , Rfl:  predniSONE (DELTASONE) 20 MG tablet, Take 3 tablets (60 mg total) by mouth 2 (two) times daily with a meal. Take 60mg  two times a day x 2 days, then 40mg  two times a day x 2 days, then 20mg  two times a dayx 2 days, then 20mg  daily x 2 days, Disp: 26 tablet, Rfl: 0;  simvastatin (ZOCOR) 20 MG tablet, Take 1 tablet (20 mg total) by mouth every evening., Disp: 30 tablet, Rfl: 0 ziprasidone (GEODON) 60 MG capsule, Take 120 mg by mouth at bedtime., Disp: , Rfl:   BP 128/91  Pulse 71  Ht 5\' 2"  (1.575  m)  Wt 213 lb (96.616 kg)  BMI 38.95 kg/m2  SpO2 100%  Body mass index is 38.95 kg/(m^2).          Review of Systems denies chest pain, dyspnea on exertion, PND, orthopnea, hemoptysis, does complain of occasional wheezing from COPD. Other systems negative and complete review of systems     Objective:   Physical Exam BP 128/91  Pulse 71  Ht 5\' 2"  (1.575 m)  Wt 213 lb (96.616 kg)  BMI 38.95 kg/m2  SpO2 100%  Gen.-alert and oriented x3 in no apparent distress HEENT normal for age Lungs no rhonchi or wheezing Cardiovascular regular rhythm no murmurs carotid pulses 3+ palpable no bruits audible Abdomen soft nontender no palpable  masses Musculoskeletal free of  major deformities Skin clear -no rashes Neurologic normal Lower extremities 3+ femoral and dorsalis pedis pulses palpable bilaterally with no edema  I reviewed the results of the vein mapping which was done at the hospital on 04/26/2013. This reveals the veins in the right upper treatment to be larger than the left with possibility of fistula in the brachial cephalic location or basilic vein transposition. I independently performed an ultrasound examination of both upper extremities with the SonoSite today and concur with the above findings at the right upper extremity is the preferred arm       Assessment:     Chronic renal insufficiency-stage IV-needs vascular access    Plan:     Plan creation right arm AV fistula tomorrow-brachial cephalic versus basilic vein transposition Discussed with patient the fact that this may not mature into satisfactory site and they were need revision or another fistula. She does understand this and would like to proceed tomorrow

## 2013-06-12 NOTE — Transfer of Care (Signed)
Immediate Anesthesia Transfer of Care Note  Patient: Jennifer Lawson  Procedure(s) Performed: Procedure(s): ARTERIOVENOUS (AV) FISTULA CREATION; RIGHT  BASILIC VEIN TRANSPOSITION with Intraoperative ultrasound (Right)  Patient Location: PACU  Anesthesia Type:General  Level of Consciousness: awake and alert   Airway & Oxygen Therapy: Patient Spontanous Breathing and Patient connected to face mask oxygen  Post-op Assessment: Report given to PACU RN and Post -op Vital signs reviewed and stable  Post vital signs: Reviewed and stable  Complications: No apparent anesthesia complications

## 2013-06-12 NOTE — Anesthesia Postprocedure Evaluation (Signed)
  Anesthesia Post-op Note  Patient: Jennifer Lawson  Procedure(s) Performed: Procedure(s): ARTERIOVENOUS (AV) FISTULA CREATION; RIGHT  BASILIC VEIN TRANSPOSITION with Intraoperative ultrasound (Right)  Patient Location: PACU  Anesthesia Type:General  Level of Consciousness: awake, alert , oriented and patient cooperative  Airway and Oxygen Therapy: Patient Spontanous Breathing and Patient connected to nasal cannula oxygen  Post-op Pain: none  Post-op Assessment: Post-op Vital signs reviewed, Patient's Cardiovascular Status Stable, Respiratory Function Stable, Patent Airway, No signs of Nausea or vomiting and Pain level controlled  Post-op Vital Signs: Reviewed and stable  Complications: No apparent anesthesia complications

## 2013-06-12 NOTE — Anesthesia Procedure Notes (Signed)
Procedure Name: Intubation Date/Time: 06/12/2013 11:33 AM Performed by: Ollen Bowl Pre-anesthesia Checklist: Patient identified, Timeout performed, Emergency Drugs available, Suction available and Patient being monitored Patient Re-evaluated:Patient Re-evaluated prior to inductionOxygen Delivery Method: Circle system utilized and Simple face mask Preoxygenation: Pre-oxygenation with 100% oxygen Intubation Type: IV induction Ventilation: Mask ventilation with difficulty and Oral airway inserted - appropriate to patient size Laryngoscope Size: Sabra Heck and 2 Grade View: Grade I Tube type: Oral Tube size: 7.0 mm Number of attempts: 2 Airway Equipment and Method: Patient positioned with wedge pillow and Stylet Placement Confirmation: ETT inserted through vocal cords under direct vision,  positive ETCO2 and breath sounds checked- equal and bilateral Secured at: 22 cm Tube secured with: Tape

## 2013-06-12 NOTE — Anesthesia Preprocedure Evaluation (Addendum)
Anesthesia Evaluation  Patient identified by MRN, date of birth, ID band Patient awake    Reviewed: Allergy & Precautions, H&P , NPO status , Patient's Chart, lab work & pertinent test results, reviewed documented beta blocker date and time   History of Anesthesia Complications Negative for: history of anesthetic complications  Airway Mallampati: II TM Distance: >3 FB Neck ROM: Full    Dental  (+) Edentulous Upper and Edentulous Lower   Pulmonary shortness of breath (O2 at night) and Long-Term Oxygen Therapy, asthma , COPD COPD inhaler, Current Smoker,  breath sounds clear to auscultation        Cardiovascular hypertension, Pt. on medications and Pt. on home beta blockers Rhythm:Regular Rate:Normal  9/14 ECHO: EF 60-70%, mild MR   Neuro/Psych Seizures -, Well Controlled,  PSYCHIATRIC DISORDERS (PTSD) Depression Bipolar Disorder    GI/Hepatic Neg liver ROS, GERD-  Controlled,  Endo/Other  diabetes (diet controlled, glu 80)Morbid obesity  Renal/GU ESRFRenal disease (K+ 4.1no dialysis yet)     Musculoskeletal   Abdominal (+) + obese,   Peds  Hematology   Anesthesia Other Findings   Reproductive/Obstetrics                        Anesthesia Physical Anesthesia Plan  ASA: III  Anesthesia Plan: General   Post-op Pain Management:    Induction: Intravenous  Airway Management Planned: LMA  Additional Equipment:   Intra-op Plan:   Post-operative Plan:   Informed Consent: I have reviewed the patients History and Physical, chart, labs and discussed the procedure including the risks, benefits and alternatives for the proposed anesthesia with the patient or authorized representative who has indicated his/her understanding and acceptance.     Plan Discussed with: CRNA and Surgeon  Anesthesia Plan Comments: (Plan routine monitors, GA- LMA OK)        Anesthesia Quick Evaluation

## 2013-06-13 ENCOUNTER — Emergency Department (HOSPITAL_COMMUNITY): Payer: Medicare Other

## 2013-06-13 ENCOUNTER — Emergency Department (HOSPITAL_COMMUNITY)
Admission: EM | Admit: 2013-06-13 | Discharge: 2013-06-13 | Disposition: A | Discharge status: Home / Self Care | Payer: Medicare Other | Attending: Emergency Medicine | Admitting: Emergency Medicine

## 2013-06-13 ENCOUNTER — Encounter (HOSPITAL_COMMUNITY): Payer: Self-pay | Admitting: Emergency Medicine

## 2013-06-13 ENCOUNTER — Telehealth: Payer: Self-pay | Admitting: Vascular Surgery

## 2013-06-13 DIAGNOSIS — I120 Hypertensive chronic kidney disease with stage 5 chronic kidney disease or end stage renal disease: Secondary | ICD-10-CM | POA: Insufficient documentation

## 2013-06-13 DIAGNOSIS — S4980XA Other specified injuries of shoulder and upper arm, unspecified arm, initial encounter: Secondary | ICD-10-CM | POA: Diagnosis not present

## 2013-06-13 DIAGNOSIS — F17200 Nicotine dependence, unspecified, uncomplicated: Secondary | ICD-10-CM | POA: Insufficient documentation

## 2013-06-13 DIAGNOSIS — Z8659 Personal history of other mental and behavioral disorders: Secondary | ICD-10-CM | POA: Insufficient documentation

## 2013-06-13 DIAGNOSIS — W19XXXA Unspecified fall, initial encounter: Secondary | ICD-10-CM | POA: Insufficient documentation

## 2013-06-13 DIAGNOSIS — G40909 Epilepsy, unspecified, not intractable, without status epilepticus: Secondary | ICD-10-CM | POA: Insufficient documentation

## 2013-06-13 DIAGNOSIS — J029 Acute pharyngitis, unspecified: Secondary | ICD-10-CM | POA: Diagnosis not present

## 2013-06-13 DIAGNOSIS — N186 End stage renal disease: Secondary | ICD-10-CM | POA: Insufficient documentation

## 2013-06-13 DIAGNOSIS — R55 Syncope and collapse: Secondary | ICD-10-CM | POA: Insufficient documentation

## 2013-06-13 DIAGNOSIS — E119 Type 2 diabetes mellitus without complications: Secondary | ICD-10-CM | POA: Insufficient documentation

## 2013-06-13 DIAGNOSIS — Z96659 Presence of unspecified artificial knee joint: Secondary | ICD-10-CM | POA: Insufficient documentation

## 2013-06-13 DIAGNOSIS — Z992 Dependence on renal dialysis: Secondary | ICD-10-CM | POA: Insufficient documentation

## 2013-06-13 DIAGNOSIS — M238X9 Other internal derangements of unspecified knee: Secondary | ICD-10-CM | POA: Diagnosis not present

## 2013-06-13 DIAGNOSIS — S46909A Unspecified injury of unspecified muscle, fascia and tendon at shoulder and upper arm level, unspecified arm, initial encounter: Secondary | ICD-10-CM | POA: Diagnosis not present

## 2013-06-13 DIAGNOSIS — Y929 Unspecified place or not applicable: Secondary | ICD-10-CM | POA: Insufficient documentation

## 2013-06-13 DIAGNOSIS — R011 Cardiac murmur, unspecified: Secondary | ICD-10-CM | POA: Insufficient documentation

## 2013-06-13 DIAGNOSIS — IMO0002 Reserved for concepts with insufficient information to code with codable children: Secondary | ICD-10-CM | POA: Diagnosis not present

## 2013-06-13 DIAGNOSIS — Z79899 Other long term (current) drug therapy: Secondary | ICD-10-CM | POA: Diagnosis not present

## 2013-06-13 DIAGNOSIS — J4489 Other specified chronic obstructive pulmonary disease: Secondary | ICD-10-CM | POA: Insufficient documentation

## 2013-06-13 DIAGNOSIS — J449 Chronic obstructive pulmonary disease, unspecified: Secondary | ICD-10-CM | POA: Diagnosis not present

## 2013-06-13 DIAGNOSIS — R569 Unspecified convulsions: Secondary | ICD-10-CM | POA: Diagnosis not present

## 2013-06-13 DIAGNOSIS — M1710 Unilateral primary osteoarthritis, unspecified knee: Secondary | ICD-10-CM | POA: Diagnosis not present

## 2013-06-13 DIAGNOSIS — N189 Chronic kidney disease, unspecified: Secondary | ICD-10-CM

## 2013-06-13 DIAGNOSIS — M25369 Other instability, unspecified knee: Secondary | ICD-10-CM

## 2013-06-13 DIAGNOSIS — Z8719 Personal history of other diseases of the digestive system: Secondary | ICD-10-CM | POA: Diagnosis not present

## 2013-06-13 DIAGNOSIS — Y9389 Activity, other specified: Secondary | ICD-10-CM | POA: Insufficient documentation

## 2013-06-13 DIAGNOSIS — I129 Hypertensive chronic kidney disease with stage 1 through stage 4 chronic kidney disease, or unspecified chronic kidney disease: Secondary | ICD-10-CM | POA: Diagnosis not present

## 2013-06-13 LAB — COMPREHENSIVE METABOLIC PANEL
ALT: 11 U/L (ref 0–35)
AST: 15 U/L (ref 0–37)
Albumin: 3.4 g/dL — ABNORMAL LOW (ref 3.5–5.2)
Alkaline Phosphatase: 66 U/L (ref 39–117)
BUN: 45 mg/dL — ABNORMAL HIGH (ref 6–23)
CO2: 24 mEq/L (ref 19–32)
Calcium: 10.6 mg/dL — ABNORMAL HIGH (ref 8.4–10.5)
Chloride: 99 mEq/L (ref 96–112)
Creatinine, Ser: 3.01 mg/dL — ABNORMAL HIGH (ref 0.50–1.10)
GFR calc Af Amer: 19 mL/min — ABNORMAL LOW (ref >90)
GFR calc non Af Amer: 17 mL/min — ABNORMAL LOW (ref >90)
Glucose, Bld: 88 mg/dL (ref 70–99)
Potassium: 5 mEq/L (ref 3.7–5.3)
Sodium: 137 mEq/L (ref 137–147)
Total Bilirubin: <0.2 mg/dL — ABNORMAL LOW (ref 0.3–1.2)
Total Protein: 8.6 g/dL — ABNORMAL HIGH (ref 6.0–8.3)

## 2013-06-13 LAB — CBC WITH DIFFERENTIAL/PLATELET
Basophils Absolute: 0 10*3/uL (ref 0.0–0.1)
Basophils Relative: 0 % (ref 0–1)
Eosinophils Absolute: 0 10*3/uL (ref 0.0–0.7)
Eosinophils Relative: 1 % (ref 0–5)
HCT: 38.7 % (ref 36.0–46.0)
Hemoglobin: 12 g/dL (ref 12.0–15.0)
Lymphocytes Relative: 15 % (ref 12–46)
Lymphs Abs: 1.2 10*3/uL (ref 0.7–4.0)
MCH: 30.4 pg (ref 26.0–34.0)
MCHC: 31 g/dL (ref 30.0–36.0)
MCV: 98 fL (ref 78.0–100.0)
Monocytes Absolute: 0.6 10*3/uL (ref 0.1–1.0)
Monocytes Relative: 8 % (ref 3–12)
Neutro Abs: 6.5 10*3/uL (ref 1.7–7.7)
Neutrophils Relative %: 77 % (ref 43–77)
Platelets: 188 10*3/uL (ref 150–400)
RBC: 3.95 MIL/uL (ref 3.87–5.11)
RDW: 16.2 % — ABNORMAL HIGH (ref 11.5–15.5)
WBC: 8.4 10*3/uL (ref 4.0–10.5)

## 2013-06-13 LAB — VALPROIC ACID LEVEL: Valproic Acid Lvl: 46.6 ug/mL — ABNORMAL LOW (ref 50.0–100.0)

## 2013-06-13 LAB — TROPONIN I: Troponin I: <0.3 ng/mL (ref <0.30)

## 2013-06-13 LAB — GLUCOSE, CAPILLARY: Glucose-Capillary: 91 mg/dL (ref 70–99)

## 2013-06-13 MED ORDER — ACETAMINOPHEN 500 MG PO TABS
1000.0000 mg | ORAL_TABLET | Freq: Once | ORAL | Status: AC
  Administered 2013-06-13: 1000 mg via ORAL
  Filled 2013-06-13: qty 2

## 2013-06-13 NOTE — Discharge Instructions (Signed)
As discussed, your evaluation has been largely reassuring.  However it is important to have your condition and monitored and managed by your team of physicians to insure appropriate ongoing care.  Please be sure to take all medication as directed, including your Depakote.  Please have this value checked by your physician to ensure appropriate dosing.   Return here for concerning changes in your condition.

## 2013-06-13 NOTE — ED Notes (Signed)
Per EMS:  posible Syncopal episode at home, stent placed yesterday.  Acute weakness vs syncope per ems. EMS reported BP 60/40, HR 70, 12 lead EKG unremarkable, CBG 119.  Given 100 ml NaCl PTA, hx of CHF, CKD 20 gauge in left AC.  Patient is alert and oriented x4 on arrival, follows commands

## 2013-06-13 NOTE — ED Notes (Signed)
Daughter is Catherina [phone]

## 2013-06-13 NOTE — ED Notes (Signed)
CBG 91 

## 2013-06-13 NOTE — ED Provider Notes (Signed)
CSN: [id number]     Arrival date & time 06/13/13  1338 History   First MD Initiated Contact with Patient 06/13/13 1339     No chief complaint on file.  (Consider location/radiation/quality/duration/timing/severity/associated sxs/prior Treatment) HPI Patient had a dialysis graft placed in her right upper arm yesterday. She states she did not feel like eating today and has not had anything to eat or drink today. She states her throat is sore since she had the surgery. She states she was walking to the bathroom in her left knee gave out on her. She states after she went down to the floor she had a syncopal episode. She states her daughter heard her fall. Daughter is not in the ED. Patient states she has seizure disorder and syncope. She states she is taking Depakote for her seizures. She has had a total knee replacement on the right. She states she is unable to walk as her left knee still hurts. She also complains of pain in her right arm. Patient states she has not started dialysis yet but they anticipate it will be in the next 3 months. Her last seizure was possibly in November or December. She has not had problem with her left knee in the past. She denies hitting her head.  PCP Dr Gwenith Daily Nephrologist ? Dr Florene Glen  Past Medical History  Diagnosis Date  . Mental disorder   . Depression   . Hypertension   . Overdose   . Tobacco use disorder 11/13/2012  . Complication of anesthesia     difficulty going to sleep  . Chronic kidney disease     06/11/13- not on dialysis  . Shortness of breath     lying down flat  . Diabetes mellitus without complication     denies  . PTSD (post-traumatic stress disorder)   . Asthma   . COPD (chronic obstructive pulmonary disease)   . Heart murmur   . GERD (gastroesophageal reflux disease)   . Seizures     "passsed out"  . History of blood transfusion    Past Surgical History  Procedure Laterality Date  . Right knee replacement      she says it was last  year.  . Esophagogastroduodenoscopy Left 11/14/2012    Procedure: ESOPHAGOGASTRODUODENOSCOPY (EGD);  Surgeon: [name], MD;  Location: [facility];  Service: Endoscopy;  Laterality: Left;  . Joint replacement Right     knee  . Parathyroidectomy     Family History  Problem Relation Age of Onset  . Diabetes Mother   . Hyperlipidemia Mother   . Hypertension Mother   . Diabetes Father   . Hypertension Father   . Hyperlipidemia Father    History  Substance Use Topics  . Smoking status: Current Every Day Smoker -- 0.50 packs/day for 40 years    Types: Cigarettes  . Smokeless tobacco: Never Used  . Alcohol Use: Yes     Comment: social   Lives with daughter Denies alcohol   OB History   Grav Para Term Preterm Abortions TAB SAB Ect Mult Living                 Review of Systems  All other systems reviewed and are negative.    Allergies  Codeine sulfate; Haldol; and Risperidone and related  Home Medications   Current Outpatient Rx  Name  Route  Sig  Dispense  Refill  . albuterol (PROVENTIL HFA;VENTOLIN HFA) 108 (90 BASE) MCG/ACT inhaler   Inhalation   Inhale 2  puffs into the lungs every 6 (six) hours as needed for wheezing or shortness of breath.         Marland Kitchen albuterol (PROVENTIL) (5 MG/ML) 0.5% nebulizer solution   Nebulization   Take 2.5 mg by nebulization every 4 (four) hours as needed for wheezing or shortness of breath.         . divalproex (DEPAKOTE) 500 MG DR tablet   Oral   Take 1,000 mg by mouth at bedtime.          . fluticasone (FLONASE) 50 MCG/ACT nasal spray   Each Nare   Place 2 sprays into both nostrils daily.         . Fluticasone-Salmeterol (ADVAIR) 100-50 MCG/DOSE AEPB   Inhalation   Inhale 1 puff into the lungs 2 (two) times daily.         . furosemide (LASIX) 80 MG tablet   Oral   Take 120 mg by mouth 2 (two) times daily. Take 1 1/2 tablet by mouth two times daily.         Marland Kitchen gabapentin (NEURONTIN) 600 MG tablet   Oral   Take  1,800 mg by mouth at bedtime.         . metoprolol succinate (TOPROL-XL) 100 MG 24 hr tablet   Oral   Take 100 mg by mouth at bedtime. Take with or immediately following a meal.         . Multiple Vitamins-Minerals (CENTRUM SILVER PO)   Oral   Take 1 tablet by mouth daily.         Marland Kitchen oxyCODONE (ROXICODONE) 5 MG immediate release tablet   Oral   Take 1 tablet (5 mg total) by mouth every 6 (six) hours as needed for severe pain.   30 tablet   0   . ziprasidone (GEODON) 60 MG capsule   Oral   Take 120 mg by mouth at bedtime.          BP 93/66  Temp(Src) 96.4 F (35.8 C) (Oral)  Resp 18  SpO2 94%  Vital signs normal   Physical Exam  Nursing note and vitals reviewed. Constitutional: She is oriented to person, place, and time. She appears well-developed and well-nourished.  Non-toxic appearance. She does not appear ill. No distress.  HENT:  Head: Normocephalic and atraumatic.  Right Ear: External ear normal.  Left Ear: External ear normal.  Nose: Nose normal. No mucosal edema or rhinorrhea.  Mouth/Throat: Oropharynx is clear and moist and mucous membranes are normal. No dental abscesses or uvula swelling.  Edentulous, tongue dry  Eyes: Conjunctivae and EOM are normal. Pupils are equal, round, and reactive to light.  Neck: Normal range of motion and full passive range of motion without pain. Neck supple.  Cardiovascular: Normal rate, regular rhythm and normal heart sounds.  Exam reveals no gallop and no friction rub.   No murmur heard. Pulmonary/Chest: Effort normal and breath sounds normal. No respiratory distress. She has no wheezes. She has no rhonchi. She has no rales. She exhibits no tenderness and no crepitus.  Abdominal: Soft. Normal appearance and bowel sounds are normal. She exhibits no distension. There is no tenderness. There is no rebound and no guarding.  Musculoskeletal: Normal range of motion. She exhibits no edema and no tenderness.  Moves all extremities  well. Small abrasion on her RIGHT knee. No abrasion to her left knee, no effusion, no bruising. Tender diffusely. Patient has surgical wounds on her right upper arm consistent with graft placement  yesterday. She has positive thrill and audible bruit over her graft  Neurological: She is alert and oriented to person, place, and time. She has normal strength. No cranial nerve deficit.  Skin: Skin is warm, dry and intact. No rash noted. No erythema. No pallor.  Psychiatric: She has a normal mood and affect. Her speech is normal and behavior is normal. Her mood appears not anxious.    ED Course  Procedures (including critical care time)  Medications  acetaminophen (TYLENOL) tablet 1,000 mg (not administered)    Pt left with Dr Vanita Panda at change of shift to get lab results.    Labs Review Results for orders placed during the hospital encounter of 06/13/13  GLUCOSE, CAPILLARY      Result Value Range   Glucose-Capillary 91  70 - 99 mg/dL   Labs pending    Imaging Review    Dg Chest 2 View  06/12/2013   CLINICAL DATA:  Right upper extremity AV fistula. Shortness of breath  EXAM: CHEST  2 VIEW  COMPARISON:  DG CHEST 2 VIEW dated 04/23/2013  FINDINGS: The heart size and mediastinal contours are within normal limits. Both lungs are clear. The visualized skeletal structures are unremarkable.  IMPRESSION: No active cardiopulmonary disease.   Electronically Signed   By: Kathreen Devoid   On: 06/12/2013 10:12   Dg Knee Complete 4 Views Left  06/13/2013   CLINICAL DATA:  Pain in the knee.  EXAM: LEFT KNEE - COMPLETE 4+ VIEW  COMPARISON:  None.  FINDINGS: There is no evidence of fracture, dislocation. There is a small suprapatellar effusion. There are degenerative joint changes of the left knee with osteophyte formation. Soft tissues are unremarkable.  IMPRESSION: No acute fracture or dislocation.   Electronically Signed   By: Abelardo Diesel M.D.   On: 06/13/2013 15:16    Dg Chest 2 View  06/12/2013    CLINICAL DATA:  Right upper extremity AV fistula. Shortness of breath  .  IMPRESSION: No active cardiopulmonary disease.   Electronically Signed   By: Kathreen Devoid   On: 06/12/2013 10:12    EKG Interpretation    Date/Time:  Thursday June 13 2013 14:12:51 EST Ventricular Rate:  72 PR Interval:  209 QRS Duration: 100 QT Interval:  421 QTC Calculation: 461 R Axis:   39 Text Interpretation:  Sinus rhythm Borderline prolonged PR interval Otherwise within normal limits No significant change since last tracing Confirmed by Tarena Gockley  MD-I, Marlin Jarrard ([id number]) on 06/13/2013 2:40:38 PM            MDM   1. Knee buckling   2. Syncope   3. Seizure disorder   4. Chronic renal disease      Disposition pending per Dr Cherylin Mylar, MD, Alanson Aly, MD 06/13/13 [phone]

## 2013-06-13 NOTE — ED Notes (Signed)
Pt states that she had a syncopal episode this morning when her left knee gave out on her at home.  Pt denies hitting head.  Pt fell on to a carpet surface.   Pt is unsure as to how long she was down on the ground.

## 2013-06-13 NOTE — ED Provider Notes (Signed)
Patient and family aware of all results.  No new complaints.  VS remain stable.  F/U instructions provided.  Carmin Muskrat, MD 06/13/13 [phone]

## 2013-06-13 NOTE — Telephone Encounter (Addendum)
Message copied by Gena Fray on Thu Jun 13, 2013  3:36 PM ------      Message from: Peter Minium K      Created: Wed Jun 12, 2013  3:42 PM      Regarding: schedule                   ----- Message -----         From: Gabriel Earing, PA-C         Sent: 06/12/2013   1:51 PM           To: Vvs Charge Pool            S/p right BVT 06/12/13.  F/u with Dr. Kellie Simmering in 6 weeks.            Thanks,      Samantha ------  06/13/13: lm for pt re appt, dpm

## 2013-06-17 ENCOUNTER — Encounter (HOSPITAL_COMMUNITY): Payer: Self-pay | Admitting: Vascular Surgery

## 2013-06-19 DIAGNOSIS — F319 Bipolar disorder, unspecified: Secondary | ICD-10-CM | POA: Diagnosis not present

## 2013-06-19 DIAGNOSIS — J45909 Unspecified asthma, uncomplicated: Secondary | ICD-10-CM | POA: Diagnosis not present

## 2013-06-19 DIAGNOSIS — E785 Hyperlipidemia, unspecified: Secondary | ICD-10-CM | POA: Diagnosis not present

## 2013-06-19 DIAGNOSIS — J3089 Other allergic rhinitis: Secondary | ICD-10-CM | POA: Diagnosis not present

## 2013-06-19 DIAGNOSIS — N183 Chronic kidney disease, stage 3 unspecified: Secondary | ICD-10-CM | POA: Diagnosis not present

## 2013-06-19 DIAGNOSIS — R110 Nausea: Secondary | ICD-10-CM | POA: Diagnosis not present

## 2013-06-19 DIAGNOSIS — F17200 Nicotine dependence, unspecified, uncomplicated: Secondary | ICD-10-CM | POA: Diagnosis not present

## 2013-06-19 DIAGNOSIS — I509 Heart failure, unspecified: Secondary | ICD-10-CM | POA: Diagnosis not present

## 2013-06-19 DIAGNOSIS — I10 Essential (primary) hypertension: Secondary | ICD-10-CM | POA: Diagnosis not present

## 2013-06-20 DIAGNOSIS — F29 Unspecified psychosis not due to a substance or known physiological condition: Secondary | ICD-10-CM | POA: Diagnosis not present

## 2013-06-20 DIAGNOSIS — F4310 Post-traumatic stress disorder, unspecified: Secondary | ICD-10-CM | POA: Diagnosis not present

## 2013-06-27 ENCOUNTER — Emergency Department (HOSPITAL_COMMUNITY): Payer: Medicare Other

## 2013-06-27 ENCOUNTER — Inpatient Hospital Stay (HOSPITAL_COMMUNITY)
Admission: EM | Admit: 2013-06-27 | Discharge: 2013-07-01 | DRG: 091 | Disposition: A | Discharge status: Home / Self Care | Payer: Medicare Other | Attending: Internal Medicine | Admitting: Internal Medicine

## 2013-06-27 ENCOUNTER — Encounter (HOSPITAL_COMMUNITY): Payer: Self-pay | Admitting: Emergency Medicine

## 2013-06-27 DIAGNOSIS — Z940 Kidney transplant status: Secondary | ICD-10-CM | POA: Diagnosis not present

## 2013-06-27 DIAGNOSIS — T50901A Poisoning by unspecified drugs, medicaments and biological substances, accidental (unintentional), initial encounter: Secondary | ICD-10-CM | POA: Diagnosis not present

## 2013-06-27 DIAGNOSIS — J4489 Other specified chronic obstructive pulmonary disease: Secondary | ICD-10-CM | POA: Diagnosis present

## 2013-06-27 DIAGNOSIS — I129 Hypertensive chronic kidney disease with stage 1 through stage 4 chronic kidney disease, or unspecified chronic kidney disease: Secondary | ICD-10-CM | POA: Diagnosis not present

## 2013-06-27 DIAGNOSIS — I120 Hypertensive chronic kidney disease with stage 5 chronic kidney disease or end stage renal disease: Secondary | ICD-10-CM | POA: Diagnosis present

## 2013-06-27 DIAGNOSIS — G929 Unspecified toxic encephalopathy: Principal | ICD-10-CM | POA: Diagnosis present

## 2013-06-27 DIAGNOSIS — T50904A Poisoning by unspecified drugs, medicaments and biological substances, undetermined, initial encounter: Secondary | ICD-10-CM | POA: Diagnosis present

## 2013-06-27 DIAGNOSIS — E872 Acidosis, unspecified: Secondary | ICD-10-CM | POA: Diagnosis not present

## 2013-06-27 DIAGNOSIS — D631 Anemia in chronic kidney disease: Secondary | ICD-10-CM | POA: Diagnosis present

## 2013-06-27 DIAGNOSIS — J9600 Acute respiratory failure, unspecified whether with hypoxia or hypercapnia: Secondary | ICD-10-CM

## 2013-06-27 DIAGNOSIS — T39091A Poisoning by salicylates, accidental (unintentional), initial encounter: Secondary | ICD-10-CM

## 2013-06-27 DIAGNOSIS — E860 Dehydration: Secondary | ICD-10-CM

## 2013-06-27 DIAGNOSIS — R4182 Altered mental status, unspecified: Secondary | ICD-10-CM | POA: Diagnosis not present

## 2013-06-27 DIAGNOSIS — N179 Acute kidney failure, unspecified: Secondary | ICD-10-CM | POA: Diagnosis not present

## 2013-06-27 DIAGNOSIS — R946 Abnormal results of thyroid function studies: Secondary | ICD-10-CM | POA: Diagnosis not present

## 2013-06-27 DIAGNOSIS — N186 End stage renal disease: Secondary | ICD-10-CM | POA: Diagnosis not present

## 2013-06-27 DIAGNOSIS — N170 Acute kidney failure with tubular necrosis: Secondary | ICD-10-CM | POA: Diagnosis not present

## 2013-06-27 DIAGNOSIS — I509 Heart failure, unspecified: Secondary | ICD-10-CM | POA: Diagnosis not present

## 2013-06-27 DIAGNOSIS — I5033 Acute on chronic diastolic (congestive) heart failure: Secondary | ICD-10-CM

## 2013-06-27 DIAGNOSIS — E785 Hyperlipidemia, unspecified: Secondary | ICD-10-CM | POA: Diagnosis not present

## 2013-06-27 DIAGNOSIS — E875 Hyperkalemia: Secondary | ICD-10-CM

## 2013-06-27 DIAGNOSIS — F319 Bipolar disorder, unspecified: Secondary | ICD-10-CM | POA: Diagnosis not present

## 2013-06-27 DIAGNOSIS — D62 Acute posthemorrhagic anemia: Secondary | ICD-10-CM | POA: Diagnosis not present

## 2013-06-27 DIAGNOSIS — R400 Somnolence: Secondary | ICD-10-CM

## 2013-06-27 DIAGNOSIS — E871 Hypo-osmolality and hyponatremia: Secondary | ICD-10-CM | POA: Diagnosis not present

## 2013-06-27 DIAGNOSIS — R5381 Other malaise: Secondary | ICD-10-CM | POA: Diagnosis not present

## 2013-06-27 DIAGNOSIS — F17200 Nicotine dependence, unspecified, uncomplicated: Secondary | ICD-10-CM | POA: Diagnosis not present

## 2013-06-27 DIAGNOSIS — E210 Primary hyperparathyroidism: Secondary | ICD-10-CM

## 2013-06-27 DIAGNOSIS — Z96659 Presence of unspecified artificial knee joint: Secondary | ICD-10-CM | POA: Diagnosis not present

## 2013-06-27 DIAGNOSIS — R000 Tachycardia, unspecified: Secondary | ICD-10-CM

## 2013-06-27 DIAGNOSIS — E119 Type 2 diabetes mellitus without complications: Secondary | ICD-10-CM | POA: Diagnosis present

## 2013-06-27 DIAGNOSIS — R5383 Other fatigue: Secondary | ICD-10-CM | POA: Diagnosis not present

## 2013-06-27 DIAGNOSIS — J441 Chronic obstructive pulmonary disease with (acute) exacerbation: Secondary | ICD-10-CM

## 2013-06-27 DIAGNOSIS — G253 Myoclonus: Secondary | ICD-10-CM | POA: Diagnosis present

## 2013-06-27 DIAGNOSIS — G9340 Encephalopathy, unspecified: Secondary | ICD-10-CM

## 2013-06-27 DIAGNOSIS — N189 Chronic kidney disease, unspecified: Secondary | ICD-10-CM

## 2013-06-27 DIAGNOSIS — G92 Toxic encephalopathy: Principal | ICD-10-CM | POA: Diagnosis present

## 2013-06-27 DIAGNOSIS — R0609 Other forms of dyspnea: Secondary | ICD-10-CM | POA: Diagnosis not present

## 2013-06-27 DIAGNOSIS — R0602 Shortness of breath: Secondary | ICD-10-CM

## 2013-06-27 DIAGNOSIS — Z79899 Other long term (current) drug therapy: Secondary | ICD-10-CM

## 2013-06-27 DIAGNOSIS — R0989 Other specified symptoms and signs involving the circulatory and respiratory systems: Secondary | ICD-10-CM | POA: Diagnosis not present

## 2013-06-27 DIAGNOSIS — D649 Anemia, unspecified: Secondary | ICD-10-CM | POA: Diagnosis not present

## 2013-06-27 DIAGNOSIS — R404 Transient alteration of awareness: Secondary | ICD-10-CM | POA: Diagnosis not present

## 2013-06-27 DIAGNOSIS — J45909 Unspecified asthma, uncomplicated: Secondary | ICD-10-CM | POA: Diagnosis not present

## 2013-06-27 DIAGNOSIS — N039 Chronic nephritic syndrome with unspecified morphologic changes: Secondary | ICD-10-CM

## 2013-06-27 DIAGNOSIS — J449 Chronic obstructive pulmonary disease, unspecified: Secondary | ICD-10-CM | POA: Diagnosis present

## 2013-06-27 DIAGNOSIS — N183 Chronic kidney disease, stage 3 unspecified: Secondary | ICD-10-CM | POA: Diagnosis not present

## 2013-06-27 DIAGNOSIS — R112 Nausea with vomiting, unspecified: Secondary | ICD-10-CM

## 2013-06-27 DIAGNOSIS — I10 Essential (primary) hypertension: Secondary | ICD-10-CM

## 2013-06-27 LAB — BLOOD GAS, ARTERIAL
Acid-base deficit: 6.2 mmol/L — ABNORMAL HIGH (ref 0.0–2.0)
Acid-base deficit: 5.4 mmol/L — ABNORMAL HIGH (ref 0.0–2.0)
Bicarbonate: 21.9 mEq/L (ref 20.0–24.0)
Bicarbonate: 22.2 mEq/L (ref 20.0–24.0)
Delivery systems: POSITIVE
Drawn by: 347641
Drawn by: 308601
Expiratory PAP: 5
FIO2: 0.28 %
FIO2: 0.5 %
Inspiratory PAP: 18
Mode: POSITIVE
O2 Saturation: 91.5 %
O2 Saturation: 98.6 %
Patient temperature: 98.6
Patient temperature: 98.7
TCO2: 21.6 mmol/L (ref 0–100)
TCO2: 21.7 mmol/L (ref 0–100)
pCO2 arterial: 61 mmHg (ref 35.0–45.0)
pCO2 arterial: 58.2 mmHg (ref 35.0–45.0)
pH, Arterial: 7.181 — CL (ref 7.350–7.450)
pH, Arterial: 7.206 — ABNORMAL LOW (ref 7.350–7.450)
pO2, Arterial: 74.9 mmHg — ABNORMAL LOW (ref 80.0–100.0)
pO2, Arterial: 175 mmHg — ABNORMAL HIGH (ref 80.0–100.0)

## 2013-06-27 LAB — CBC
HCT: 30.6 % — ABNORMAL LOW (ref 36.0–46.0)
Hemoglobin: 9.7 g/dL — ABNORMAL LOW (ref 12.0–15.0)
MCH: 29.9 pg (ref 26.0–34.0)
MCHC: 31.7 g/dL (ref 30.0–36.0)
MCV: 94.4 fL (ref 78.0–100.0)
Platelets: 278 10*3/uL (ref 150–400)
RBC: 3.24 MIL/uL — ABNORMAL LOW (ref 3.87–5.11)
RDW: 16.8 % — ABNORMAL HIGH (ref 11.5–15.5)
WBC: 7.1 10*3/uL (ref 4.0–10.5)

## 2013-06-27 LAB — CBG MONITORING, ED: Glucose-Capillary: 91 mg/dL (ref 70–99)

## 2013-06-27 LAB — COMPREHENSIVE METABOLIC PANEL
ALT: 51 U/L — ABNORMAL HIGH (ref 0–35)
AST: 41 U/L — ABNORMAL HIGH (ref 0–37)
Albumin: 3.1 g/dL — ABNORMAL LOW (ref 3.5–5.2)
Alkaline Phosphatase: 87 U/L (ref 39–117)
BUN: 85 mg/dL — ABNORMAL HIGH (ref 6–23)
CO2: 20 mEq/L (ref 19–32)
Calcium: 9.5 mg/dL (ref 8.4–10.5)
Chloride: 98 mEq/L (ref 96–112)
Creatinine, Ser: 7.11 mg/dL — ABNORMAL HIGH (ref 0.50–1.10)
GFR calc Af Amer: 7 mL/min — ABNORMAL LOW (ref >90)
GFR calc non Af Amer: 6 mL/min — ABNORMAL LOW (ref >90)
Glucose, Bld: 89 mg/dL (ref 70–99)
Potassium: 5.2 mEq/L (ref 3.7–5.3)
Sodium: 136 mEq/L — ABNORMAL LOW (ref 137–147)
Total Bilirubin: <0.2 mg/dL — ABNORMAL LOW (ref 0.3–1.2)
Total Protein: 7.7 g/dL (ref 6.0–8.3)

## 2013-06-27 LAB — ACETAMINOPHEN LEVEL: Acetaminophen (Tylenol), Serum: <15 ug/mL (ref 10–30)

## 2013-06-27 LAB — RAPID URINE DRUG SCREEN, HOSP PERFORMED
Amphetamines: NOT DETECTED
Barbiturates: NOT DETECTED
Benzodiazepines: NOT DETECTED
Cocaine: NOT DETECTED
Opiates: NOT DETECTED
Tetrahydrocannabinol: NOT DETECTED

## 2013-06-27 LAB — SALICYLATE LEVEL: Salicylate Lvl: <2 mg/dL — ABNORMAL LOW (ref 2.8–20.0)

## 2013-06-27 LAB — VALPROIC ACID LEVEL: Valproic Acid Lvl: 44 ug/mL — ABNORMAL LOW (ref 50.0–100.0)

## 2013-06-27 LAB — ETHANOL: Alcohol, Ethyl (B): <11 mg/dL (ref 0–11)

## 2013-06-27 LAB — AMMONIA

## 2013-06-27 NOTE — ED Notes (Signed)
Dr. Raliegh Ip paged r/t ABG results

## 2013-06-27 NOTE — ED Notes (Signed)
Pt lives at home with her daughter.  Daughter handles her meds but the pt is suspected to have OD'd on something.  She was picked up at an urgent care center on [address].  She has admitted to EMS that she took geodon 80mg  x 2, gabapentin ?dose x 2 and 2 "something else" that EMS couldn't understand b/c pt's speech was so slurred.  She was given narcan 1mg  that seemed to help her speech and pupils but didn't help her lethargy.  EMS placed #20 LAC, VS: 104/62, 68, 12, cbg 127, 97% 2L/ [place]

## 2013-06-27 NOTE — ED Notes (Signed)
Bed: QB34 Expected date:  Expected time:  Means of arrival:  Comments: EMS- [facility]

## 2013-06-27 NOTE — H&P (Addendum)
[facility] History and Physical  Jennifer Lawson QQP:[id number] DOB: 18-Jun-1960 DOA: 06/27/2013  Referring physician: ER physician. PCP: Benito Mccreedy, MD   Chief Complaint: Confusion and lethargy.  History obtained from patient's daughter and previous records and ER physician.  HPI: Jennifer Lawson is a 53 y.o. female with history of end-stage renal disease presently not on dialysis and has had recent right upper extremity graft, COPD, bipolar disorder was brought to the ER after patient was found to be increasingly lethargic. As per patient's daughter patient had taken 2 more of her Neurontin somewhere around midnight as she was not feeling sleepy. Patient confirms that she has not taken any other medications. In the ER patient was somnolent but easily arousable and follows commands. CTA did not show anything acute. Salicylate and Tylenol levels were undetectable. Left he was normal. Depakote level was low. Ammonia is pending. ABGs pending. Patient's creatinine was found to have doubled with the last 2 weeks. Patient has chronic shortness of breath but otherwise denies any chest pain nausea vomiting abdominal pain diarrhea. Patient daughter states that patient has not been eating well recently and has been having increasing jerking of the extremities. Patient denies any suicidal ideation.  Review of Systems: As presented in the history of presenting illness, rest negative.  Past Medical History  Diagnosis Date  . Mental disorder   . Depression   . Hypertension   . Overdose   . Tobacco use disorder 11/13/2012  . Complication of anesthesia     difficulty going to sleep  . Chronic kidney disease     06/11/13- not on dialysis  . Shortness of breath     lying down flat  . Diabetes mellitus without complication     denies  . PTSD (post-traumatic stress disorder)   . Asthma   . COPD (chronic obstructive pulmonary disease)   . Heart murmur   . GERD (gastroesophageal reflux  disease)   . Seizures     "passsed out"  . History of blood transfusion    Past Surgical History  Procedure Laterality Date  . Right knee replacement      she says it was last year.  . Esophagogastroduodenoscopy Left 11/14/2012    Procedure: ESOPHAGOGASTRODUODENOSCOPY (EGD);  Surgeon: [name], MD;  Location: [facility];  Service: Endoscopy;  Laterality: Left;  . Joint replacement Right     knee  . Parathyroidectomy    . Av fistula placement Right 06/12/2013    Procedure: ARTERIOVENOUS (AV) FISTULA CREATION; RIGHT  BASILIC VEIN TRANSPOSITION with Intraoperative ultrasound;  Surgeon: Mal [name], MD;  Location: [facility] OR;  Service: Vascular;  Laterality: Right;   Social History:  reports that she has been smoking Cigarettes.  She has a 20 pack-year smoking history. She has never used smokeless tobacco. She reports that she uses illicit drugs. She reports that she does not drink alcohol. Where does patient live home. Can patient participate in ADLs? Yes.  Allergies  Allergen Reactions  . Codeine Sulfate Anaphylaxis    Daughter called about having this allergy   . Haldol [Haloperidol Lactate] Shortness Of Breath  . Risperidone And Related Shortness Of Breath    Family History:  Family History  Problem Relation Age of Onset  . Diabetes Mother   . Hyperlipidemia Mother   . Hypertension Mother   . Diabetes Father   . Hypertension Father   . Hyperlipidemia Father       Prior to Admission medications   Medication Sig Start Date  End Date Taking? Authorizing Provider  albuterol (PROVENTIL HFA;VENTOLIN HFA) 108 (90 BASE) MCG/ACT inhaler Inhale 2 puffs into the lungs every 6 (six) hours as needed for wheezing or shortness of breath. 04/26/13  Yes Erline Hau, MD  albuterol (PROVENTIL) (5 MG/ML) 0.5% nebulizer solution Take 2.5 mg by nebulization every 4 (four) hours as needed for wheezing or shortness of breath. 04/26/13  Yes Erline Hau, MD  divalproex  (DEPAKOTE) 500 MG DR tablet Take 1,000 mg by mouth at bedtime.    Yes Historical Provider, MD  fluticasone (FLONASE) 50 MCG/ACT nasal spray Place 2 sprays into both nostrils daily.   Yes Historical Provider, MD  Fluticasone-Salmeterol (ADVAIR) 100-50 MCG/DOSE AEPB Inhale 1 puff into the lungs 2 (two) times daily. 04/26/13  Yes Erline Hau, MD  furosemide (LASIX) 80 MG tablet Take 120 mg by mouth 2 (two) times daily. Take 1 1/2 tablet by mouth two times daily.   Yes Historical Provider, MD  gabapentin (NEURONTIN) 600 MG tablet Take 1,800 mg by mouth at bedtime. 04/26/13  Yes Erline Hau, MD  hydrOXYzine (ATARAX/VISTARIL) 50 MG tablet Take 50 mg by mouth at bedtime.   Yes Historical Provider, MD  metoprolol succinate (TOPROL-XL) 100 MG 24 hr tablet Take 100 mg by mouth daily. Take with or immediately following a meal.   Yes Historical Provider, MD  Multiple Vitamins-Minerals (CENTRUM SILVER PO) Take 1 tablet by mouth daily.   Yes Historical Provider, MD  ziprasidone (GEODON) 60 MG capsule Take 120 mg by mouth at bedtime.   Yes Historical Provider, MD    Physical Exam: Filed Vitals:   06/27/13 1910 06/27/13 1930 06/27/13 1934 06/27/13 2000  BP:  109/58  115/85  Pulse: 67  66 70  Temp:      TempSrc:      Resp: 15 14 14 17   SpO2: 100%  99% 100%     General:  Well-developed well-nourished.  Eyes: Anicteric no pallor.  ENT: No discharge from the ears eyes nose mouth.  Neck: No mass felt. No neck rigidity.  Cardiovascular: S1-S2 heard.  Respiratory: No rhonchi or crepitations.  Abdomen: Soft nontender bowel sounds present.  Skin: No rash.  Musculoskeletal: No edema.  Psychiatric: Patient denies any suicidal ideation.  Neurologic: Lethargic easily arousable and follows commands. Moves all extremities. PERRLA positive. Tongue is midline.  Labs on Admission:  Basic Metabolic Panel:  Recent Labs Lab 06/27/13 1737  NA 136*  K 5.2  CL 98  CO2 20   GLUCOSE 89  BUN 85*  CREATININE 7.11*  CALCIUM 9.5   Liver Function Tests:  Recent Labs Lab 06/27/13 1737  AST 41*  ALT 51*  ALKPHOS 87  BILITOT <0.2*  PROT 7.7  ALBUMIN 3.1*   No results found for this basename: LIPASE, AMYLASE,  in the last 168 hours No results found for this basename: AMMONIA,  in the last 168 hours CBC:  Recent Labs Lab 06/27/13 1737  WBC 7.1  HGB 9.7*  HCT 30.6*  MCV 94.4  PLT 278   Cardiac Enzymes: No results found for this basename: CKTOTAL, CKMB, CKMBINDEX, TROPONINI,  in the last 168 hours  BNP (last 3 results)  Recent Labs  11/17/12 0933 01/17/13 04/23/13 1445  PROBNP 643.4* 6078.0* 7094.0*   CBG:  Recent Labs Lab 06/27/13 1717  GLUCAP 91    Radiological Exams on Admission: Dg Chest 1 View  06/27/2013   CLINICAL DATA:  Shortness of breath.  EXAM:  CHEST - 1 VIEW  COMPARISON:  June 12, 2013.  FINDINGS: Mild cardiomegaly is noted. Mild central pulmonary vascular congestion is noted. No pneumothorax or pleural effusion is noted. Linear density is noted laterally in the left lung most consistent with subsegmental atelectasis. Bony thorax appears intact.  IMPRESSION: Focus of subsegmental atelectasis seen laterally in left lung. Mild cardiomegaly and central pulmonary vascular congestion is noted.   Electronically Signed   By: Sabino Dick M.D.   On: 06/27/2013 18:12   Ct Head Wo Contrast  06/27/2013   CLINICAL DATA:  Altered mental status  EXAM: CT HEAD WITHOUT CONTRAST  TECHNIQUE: Contiguous axial images were obtained from the base of the skull through the vertex without intravenous contrast.  COMPARISON:  MRI brain dated 01/19/2013  FINDINGS: No evidence of parenchymal hemorrhage or extra-axial fluid collection. No mass lesion, mass effect, or midline shift.  No CT evidence of acute infarction.  Subcortical white matter and periventricular small vessel ischemic changes.  Partial opacification of the left ethmoid and maxillary sinuses.  Mastoid air cells are clear.  No evidence of calvarial fracture.  IMPRESSION: No evidence of acute intracranial abnormality.   Electronically Signed   By: Julian Hy M.D.   On: 06/27/2013 18:27    EKG: Independently reviewed. Normal sinus rhythm with QTC of 445. Low-voltage.  Assessment/Plan Principal Problem:   Acute encephalopathy Active Problems:   Drug overdose   Bipolar disorder   Renal failure (ARF), acute on chronic   1. Acute encephalopathy - probably secondary to drug overdose which patient states was not with suicidal intent. At this time ABG and ammonia levels are pending. Holding off patient's psychiatric medications including Depakote, Geodon and Neurontin and hydroxyzine. As patient's creatinine is further worsened I am trying to transfer patient to [facility] for further close followup of her mental status and renal function. I have discussed with poison control. Poison control at this time has recommended close followup. 2. Acute renal failure on chronic kidney disease - patient's daughters states that patient has not made any urine today. Foley catheter has been ordered. Patient will be transferred to [facility]. Closely follow intake output and metabolic panel. We'll discuss with the nephrologist. ABG is pending. 3. COPD - presently not wheezing. 4. History of bipolar disorder - see #1. Holding off all anti-psychiatric medications for now due to acute encephalopathy. 5. History of diabetes mellitus per records - follow CBGs closely. 6. Anemia probably from chronic kidney disease - closely follow CBC.  I have reviewed patient's old charts and labs. Have discussed with poison control. Patient will be transferred to [facility] due to worsening renal function. Patient's daughter is acceptable to the transfer. Dr. Alcario Drought will be the accepting physician at [facility].   Addendum - patient ABG shows a pH of 7.1 PCO2 of 61 PO2 of 74.9 and a bicarbonate  of 21.9. I have discussed with on-call pulmonary critical care Dr. Lake Bells. At this time patient has been placed on BiPAP and critical care will be seeing patient once patient reaches [facility]. Patient presently is able to talk. Repeat ABG in one hour after placed on BiPAP. I have also discussed with on-call nephrologist Dr. Posey Pronto. Nephrologist will be seeing patient in consult. Dr. Posey Pronto has recommended to hold Lasix and gently hydrate with normal saline at 75 cc per hour.  Code Status: Full code.  Family Communication: Patient's daughter.  Disposition Plan: Admit to inpatient.    Justyn Boyson N. [facility] Pager [phone]  If 7PM-7AM, please contact night-coverage [email or web address] Password [facility] 06/27/2013, 8:23 PM

## 2013-06-27 NOTE — ED Notes (Signed)
Dr. Hal Hope called stepdown and stated that a patient is currently in the room this patient will be moving to. They will call when patient has been transferred and the room is clean, but "this will take a long while."

## 2013-06-27 NOTE — ED Notes (Signed)
Per hospitalist, patient to be admitted to [facility] due to increased renal failure Daughter at bedside and is aware of plan

## 2013-06-27 NOTE — ED Notes (Signed)
CBG result is 91 mg/dL.

## 2013-06-27 NOTE — ED Notes (Signed)
Assumed care of patient Patient noted to be lethargic, but will awake to verbal and tactile stimuli Patient alert and oriented x 4 and able to answer questions, but falls back to sleep  VS updated and stable

## 2013-06-27 NOTE — ED Notes (Signed)
RT called and made aware of order for ABG

## 2013-06-27 NOTE — ED Provider Notes (Signed)
CSN: [id number]     Arrival date & time 06/27/13  1650 History   First MD Initiated Contact with Patient 06/27/13 1653     Chief Complaint  Patient presents with  . Altered Mental Status     (Consider location/radiation/quality/duration/timing/severity/associated sxs/prior Treatment) HPI Comments: Got up in the middle of the night and took 2 extra Geodon and Gabapentin. She also stated she took depakote. Gabapentins 600 mg, depakotes 500 mg DR tablets, geodon 60 mgs capsules at home.    Patient is a 53 y.o. female presenting with altered mental status and Overdose. The history is provided by the patient.  Altered Mental Status Presenting symptoms: lethargy   Presenting symptoms: no behavior changes   Severity:  Moderate Most recent episode:  Today Episode history:  Single Timing:  Constant Progression:  Unchanged Chronicity:  Recurrent Context: drug use   Associated symptoms: no abdominal pain and no headaches   Drug Overdose This is a recurrent problem. The current episode started 12 to 24 hours ago. The problem occurs constantly. The problem has not changed since onset.Pertinent negatives include no chest pain, no abdominal pain, no headaches and no shortness of breath. Nothing aggravates the symptoms. Nothing relieves the symptoms.    Past Medical History  Diagnosis Date  . Mental disorder   . Depression   . Hypertension   . Overdose   . Tobacco use disorder 11/13/2012  . Complication of anesthesia     difficulty going to sleep  . Chronic kidney disease     06/11/13- not on dialysis  . Shortness of breath     lying down flat  . Diabetes mellitus without complication     denies  . PTSD (post-traumatic stress disorder)   . Asthma   . COPD (chronic obstructive pulmonary disease)   . Heart murmur   . GERD (gastroesophageal reflux disease)   . Seizures     "passsed out"  . History of blood transfusion    Past Surgical History  Procedure Laterality Date  . Right knee  replacement      she says it was last year.  . Esophagogastroduodenoscopy Left 11/14/2012    Procedure: ESOPHAGOGASTRODUODENOSCOPY (EGD);  Surgeon: [name], MD;  Location: [facility];  Service: Endoscopy;  Laterality: Left;  . Joint replacement Right     knee  . Parathyroidectomy    . Av fistula placement Right 06/12/2013    Procedure: ARTERIOVENOUS (AV) FISTULA CREATION; RIGHT  BASILIC VEIN TRANSPOSITION with Intraoperative ultrasound;  Surgeon: Mal [name], MD;  Location: [facility] OR;  Service: Vascular;  Laterality: Right;   Family History  Problem Relation Age of Onset  . Diabetes Mother   . Hyperlipidemia Mother   . Hypertension Mother   . Diabetes Father   . Hypertension Father   . Hyperlipidemia Father    History  Substance Use Topics  . Smoking status: Current Every Day Smoker -- 0.50 packs/day for 40 years    Types: Cigarettes  . Smokeless tobacco: Never Used  . Alcohol Use: Yes     Comment: social   OB History   Grav Para Term Preterm Abortions TAB SAB Ect Mult Living                 Review of Systems  Unable to perform ROS: Mental status change  Respiratory: Negative for shortness of breath.   Cardiovascular: Negative for chest pain.  Gastrointestinal: Negative for abdominal pain.  Neurological: Negative for headaches.      Allergies  Codeine sulfate; Haldol; and Risperidone and related  Home Medications   Current Outpatient Rx  Name  Route  Sig  Dispense  Refill  . albuterol (PROVENTIL HFA;VENTOLIN HFA) 108 (90 BASE) MCG/ACT inhaler   Inhalation   Inhale 2 puffs into the lungs every 6 (six) hours as needed for wheezing or shortness of breath.         Marland Kitchen albuterol (PROVENTIL) (5 MG/ML) 0.5% nebulizer solution   Nebulization   Take 2.5 mg by nebulization every 4 (four) hours as needed for wheezing or shortness of breath.         . divalproex (DEPAKOTE) 500 MG DR tablet   Oral   Take 1,000 mg by mouth at bedtime.          . fluticasone  (FLONASE) 50 MCG/ACT nasal spray   Each Nare   Place 2 sprays into both nostrils daily.         . Fluticasone-Salmeterol (ADVAIR) 100-50 MCG/DOSE AEPB   Inhalation   Inhale 1 puff into the lungs 2 (two) times daily.         . furosemide (LASIX) 80 MG tablet   Oral   Take 120 mg by mouth 2 (two) times daily. Take 1 1/2 tablet by mouth two times daily.         Marland Kitchen gabapentin (NEURONTIN) 600 MG tablet   Oral   Take 1,800 mg by mouth at bedtime.         . metoprolol succinate (TOPROL-XL) 100 MG 24 hr tablet   Oral   Take 100 mg by mouth at bedtime. Take with or immediately following a meal.         . Multiple Vitamins-Minerals (CENTRUM SILVER PO)   Oral   Take 1 tablet by mouth daily.         Marland Kitchen oxyCODONE (ROXICODONE) 5 MG immediate release tablet   Oral   Take 1 tablet (5 mg total) by mouth every 6 (six) hours as needed for severe pain.   30 tablet   0   . ziprasidone (GEODON) 60 MG capsule   Oral   Take 120 mg by mouth at bedtime.          BP 104/53  Pulse 70  Temp(Src) 98.4 F (36.9 C) (Oral)  Resp 10  SpO2 96% Physical Exam  Nursing note and vitals reviewed. Constitutional: She is oriented to person, place, and time. She appears well-developed and well-nourished. No distress.  HENT:  Head: Normocephalic and atraumatic.  Eyes: EOM are normal. Pupils are equal, round, and reactive to light.  Neck: Normal range of motion. Neck supple.  Cardiovascular: Normal rate and regular rhythm.  Exam reveals no friction rub.   No murmur heard. Pulmonary/Chest: Effort normal and breath sounds normal. No respiratory distress. She has no wheezes. She has no rales.  Abdominal: Soft. She exhibits no distension. There is no tenderness. There is no rebound.  Musculoskeletal: Normal range of motion. She exhibits no edema.  Neurological: She is alert and oriented to person, place, and time.  Somnolent, easily arousable, follows commands  Skin: She is not diaphoretic.     ED Course  Procedures (including critical care time) Labs Review Labs Reviewed  CBC - Abnormal; Notable for the following:    RBC 3.24 (*)    Hemoglobin 9.7 (*)    HCT 30.6 (*)    RDW 16.8 (*)    All other components within normal limits  COMPREHENSIVE METABOLIC PANEL - Abnormal; Notable  for the following:    Sodium 136 (*)    BUN 85 (*)    Creatinine, Ser 7.11 (*)    Albumin 3.1 (*)    AST 41 (*)    ALT 51 (*)    Total Bilirubin <0.2 (*)    GFR calc non Af Amer 6 (*)    GFR calc Af Amer 7 (*)    All other components within normal limits  SALICYLATE LEVEL - Abnormal; Notable for the following:    Salicylate Lvl <2.8 (*)    All other components within normal limits  VALPROIC ACID LEVEL - Abnormal; Notable for the following:    Valproic Acid Lvl 44.0 (*)    All other components within normal limits  BLOOD GAS, ARTERIAL - Abnormal; Notable for the following:    pH, Arterial 7.181 (*)    pCO2 arterial 61.0 (*)    pO2, Arterial 74.9 (*)    Acid-base deficit 6.2 (*)    All other components within normal limits  BLOOD GAS, ARTERIAL - Abnormal; Notable for the following:    pH, Arterial 7.206 (*)    pCO2 arterial 58.2 (*)    pO2, Arterial 175.0 (*)    Acid-base deficit 5.4 (*)    All other components within normal limits  ETHANOL  URINE RAPID DRUG SCREEN (HOSP PERFORMED)  ACETAMINOPHEN LEVEL  AMMONIA  CBG MONITORING, ED   Imaging Review Dg Chest 1 View  06/27/2013   CLINICAL DATA:  Shortness of breath.  EXAM: CHEST - 1 VIEW  COMPARISON:  June 12, 2013.  FINDINGS: Mild cardiomegaly is noted. Mild central pulmonary vascular congestion is noted. No pneumothorax or pleural effusion is noted. Linear density is noted laterally in the left lung most consistent with subsegmental atelectasis. Bony thorax appears intact.  IMPRESSION: Focus of subsegmental atelectasis seen laterally in left lung. Mild cardiomegaly and central pulmonary vascular congestion is noted.    Electronically Signed   By: Sabino Dick M.D.   On: 06/27/2013 18:12   Ct Head Wo Contrast  06/27/2013   CLINICAL DATA:  Altered mental status  EXAM: CT HEAD WITHOUT CONTRAST  TECHNIQUE: Contiguous axial images were obtained from the base of the skull through the vertex without intravenous contrast.  COMPARISON:  MRI brain dated 01/19/2013  FINDINGS: No evidence of parenchymal hemorrhage or extra-axial fluid collection. No mass lesion, mass effect, or midline shift.  No CT evidence of acute infarction.  Subcortical white matter and periventricular small vessel ischemic changes.  Partial opacification of the left ethmoid and maxillary sinuses. Mastoid air cells are clear.  No evidence of calvarial fracture.  IMPRESSION: No evidence of acute intracranial abnormality.   Electronically Signed   By: Julian Hy M.D.   On: 06/27/2013 18:27    EKG Interpretation    Date/Time:  Thursday June 27 2013 17:14:49 EST Ventricular Rate:  70 PR Interval:  194 QRS Duration: 113 QT Interval:  412 QTC Calculation: 445 R Axis:   90 Text Interpretation:  Sinus rhythm Low voltage, precordial leads Probable anteroseptal infarct, old Similar to prior, no QT prolongation Confirmed by Mingo Amber  MD, [place] ([id number]) on 06/27/2013 5:28:26 PM            MDM   Final diagnoses:  Acute encephalopathy  Drug overdose  Renal failure (ARF), acute on chronic    64F with hx of drug abuse, overdoses, presents with drug overdoses. Wakes up in the middle of the night to take extra meds vs sleepwalks. Daughter  states hx of this also. Somnolent here, but awakes easily and follows commands. Unknown time of onset. Patient states depakote and geodon taken, EMS states gabapentin and geodon. Will check depakote level. EKG without QT prolongation. EMS reported twitching, none seen by me, will hold off on benadryl/cogentin.  Will check labs, Head CT in case of fall. Hypoxic here, satting well on 2.5 L, uses 2 L Pyote at home.   Labs show AoCKD, normal lytes, uremia. Normal drug screens. Admitted to medicine.  Medicine concerned patient will need dialysis, will get transferred to [facility]. ABG shows pH of 7.1, elevated CO2 at 60. Placed on BiPap. Still arouses easily, follows commands. No need for intubation.   Osvaldo Shipper, MD 06/27/13 [phone]

## 2013-06-27 NOTE — ED Notes (Signed)
Pt's daughter, [name], took pt's belongings with her, pt only left with blanket from home.  [phone]  Please notify when pt is transferred to [facility]

## 2013-06-27 NOTE — ED Notes (Signed)
Patient transported to CT 

## 2013-06-27 NOTE — ED Notes (Signed)
Pt is unable to void at this time.  

## 2013-06-28 ENCOUNTER — Other Ambulatory Visit: Payer: Self-pay

## 2013-06-28 ENCOUNTER — Inpatient Hospital Stay (HOSPITAL_COMMUNITY): Payer: Medicare Other

## 2013-06-28 DIAGNOSIS — R0989 Other specified symptoms and signs involving the circulatory and respiratory systems: Secondary | ICD-10-CM | POA: Diagnosis not present

## 2013-06-28 DIAGNOSIS — G92 Toxic encephalopathy: Secondary | ICD-10-CM | POA: Diagnosis not present

## 2013-06-28 DIAGNOSIS — R404 Transient alteration of awareness: Secondary | ICD-10-CM | POA: Diagnosis not present

## 2013-06-28 DIAGNOSIS — D631 Anemia in chronic kidney disease: Secondary | ICD-10-CM | POA: Diagnosis not present

## 2013-06-28 DIAGNOSIS — N170 Acute kidney failure with tubular necrosis: Secondary | ICD-10-CM | POA: Diagnosis not present

## 2013-06-28 DIAGNOSIS — G9340 Encephalopathy, unspecified: Secondary | ICD-10-CM | POA: Diagnosis not present

## 2013-06-28 DIAGNOSIS — J9600 Acute respiratory failure, unspecified whether with hypoxia or hypercapnia: Secondary | ICD-10-CM | POA: Diagnosis not present

## 2013-06-28 DIAGNOSIS — N189 Chronic kidney disease, unspecified: Secondary | ICD-10-CM | POA: Diagnosis not present

## 2013-06-28 DIAGNOSIS — N179 Acute kidney failure, unspecified: Secondary | ICD-10-CM | POA: Diagnosis not present

## 2013-06-28 DIAGNOSIS — N186 End stage renal disease: Secondary | ICD-10-CM | POA: Diagnosis not present

## 2013-06-28 LAB — BLOOD GAS, ARTERIAL
Acid-base deficit: 4.1 mmol/L — ABNORMAL HIGH (ref 0.0–2.0)
Acid-base deficit: 4 mmol/L — ABNORMAL HIGH (ref 0.0–2.0)
Bicarbonate: 23.1 mEq/L (ref 20.0–24.0)
Bicarbonate: 23 mEq/L (ref 20.0–24.0)
Delivery systems: POSITIVE
Delivery systems: POSITIVE
Drawn by: 270271
Drawn by: 270271
Expiratory PAP: 5
Expiratory PAP: 5
FIO2: 0.5 %
FIO2: 0.35 %
Inspiratory PAP: 15
Inspiratory PAP: 17
Mode: POSITIVE
O2 Saturation: 98 %
O2 Saturation: 93.3 %
Patient temperature: 98.6
Patient temperature: 98.6
TCO2: 25.1 mmol/L (ref 0–100)
TCO2: 24.9 mmol/L (ref 0–100)
pCO2 arterial: 64.1 mmHg (ref 35.0–45.0)
pCO2 arterial: 61.6 mmHg (ref 35.0–45.0)
pH, Arterial: 7.183 — CL (ref 7.350–7.450)
pH, Arterial: 7.197 — CL (ref 7.350–7.450)
pO2, Arterial: 109 mmHg — ABNORMAL HIGH (ref 80.0–100.0)
pO2, Arterial: 117 mmHg — ABNORMAL HIGH (ref 80.0–100.0)

## 2013-06-28 LAB — CBC WITH DIFFERENTIAL/PLATELET
Basophils Absolute: 0 10*3/uL (ref 0.0–0.1)
Basophils Relative: 0 % (ref 0–1)
Eosinophils Absolute: 0.2 10*3/uL (ref 0.0–0.7)
Eosinophils Relative: 4 % (ref 0–5)
HCT: 26.6 % — ABNORMAL LOW (ref 36.0–46.0)
Hemoglobin: 8.3 g/dL — ABNORMAL LOW (ref 12.0–15.0)
Lymphocytes Relative: 20 % (ref 12–46)
Lymphs Abs: 1.2 10*3/uL (ref 0.7–4.0)
MCH: 29.7 pg (ref 26.0–34.0)
MCHC: 31.2 g/dL (ref 30.0–36.0)
MCV: 95.3 fL (ref 78.0–100.0)
Monocytes Absolute: 0.8 10*3/uL (ref 0.1–1.0)
Monocytes Relative: 13 % — ABNORMAL HIGH (ref 3–12)
Neutro Abs: 3.7 10*3/uL (ref 1.7–7.7)
Neutrophils Relative %: 63 % (ref 43–77)
Platelets: 213 10*3/uL (ref 150–400)
RBC: 2.79 MIL/uL — ABNORMAL LOW (ref 3.87–5.11)
RDW: 17.1 % — ABNORMAL HIGH (ref 11.5–15.5)
WBC: 6 10*3/uL (ref 4.0–10.5)

## 2013-06-28 LAB — BASIC METABOLIC PANEL
BUN: 90 mg/dL — ABNORMAL HIGH (ref 6–23)
CO2: 22 mEq/L (ref 19–32)
Calcium: 9.2 mg/dL (ref 8.4–10.5)
Chloride: 102 mEq/L (ref 96–112)
Creatinine, Ser: 7.81 mg/dL — ABNORMAL HIGH (ref 0.50–1.10)
GFR calc Af Amer: 6 mL/min — ABNORMAL LOW (ref >90)
GFR calc non Af Amer: 5 mL/min — ABNORMAL LOW (ref >90)
Glucose, Bld: 81 mg/dL (ref 70–99)
Potassium: 4.8 mEq/L (ref 3.7–5.3)
Sodium: 140 mEq/L (ref 137–147)

## 2013-06-28 LAB — URINALYSIS, ROUTINE W REFLEX MICROSCOPIC
Bilirubin Urine: NEGATIVE
Glucose, UA: NEGATIVE mg/dL
Ketones, ur: NEGATIVE mg/dL
Nitrite: NEGATIVE
Protein, ur: 100 mg/dL — AB
Specific Gravity, Urine: 1.015 (ref 1.005–1.030)
Urobilinogen, UA: 0.2 mg/dL (ref 0.0–1.0)
pH: 5.5 (ref 5.0–8.0)

## 2013-06-28 LAB — COMPREHENSIVE METABOLIC PANEL
ALT: 38 U/L — ABNORMAL HIGH (ref 0–35)
AST: 15 U/L (ref 0–37)
Albumin: 2.7 g/dL — ABNORMAL LOW (ref 3.5–5.2)
Alkaline Phosphatase: 74 U/L (ref 39–117)
BUN: 88 mg/dL — ABNORMAL HIGH (ref 6–23)
CO2: 23 mEq/L (ref 19–32)
Calcium: 9.3 mg/dL (ref 8.4–10.5)
Chloride: 101 mEq/L (ref 96–112)
Creatinine, Ser: 7.82 mg/dL — ABNORMAL HIGH (ref 0.50–1.10)
GFR calc Af Amer: 6 mL/min — ABNORMAL LOW (ref >90)
GFR calc non Af Amer: 5 mL/min — ABNORMAL LOW (ref >90)
Glucose, Bld: 100 mg/dL — ABNORMAL HIGH (ref 70–99)
Potassium: 4.7 mEq/L (ref 3.7–5.3)
Sodium: 140 mEq/L (ref 137–147)
Total Bilirubin: <0.2 mg/dL — ABNORMAL LOW (ref 0.3–1.2)
Total Protein: 6.8 g/dL (ref 6.0–8.3)

## 2013-06-28 LAB — GLUCOSE, CAPILLARY
Glucose-Capillary: 108 mg/dL — ABNORMAL HIGH (ref 70–99)
Glucose-Capillary: 87 mg/dL (ref 70–99)
Glucose-Capillary: 78 mg/dL (ref 70–99)
Glucose-Capillary: 97 mg/dL (ref 70–99)
Glucose-Capillary: 116 mg/dL — ABNORMAL HIGH (ref 70–99)

## 2013-06-28 LAB — CREATININE, URINE, RANDOM: Creatinine, Urine: 262.62 mg/dL

## 2013-06-28 LAB — CK: Total CK: 34 U/L (ref 7–177)

## 2013-06-28 LAB — URINE MICROSCOPIC-ADD ON

## 2013-06-28 LAB — SODIUM, URINE, RANDOM: Sodium, Ur: <20 mEq/L

## 2013-06-28 LAB — TSH: TSH: 0.568 u[IU]/mL (ref 0.350–4.500)

## 2013-06-28 LAB — MRSA PCR SCREENING: MRSA by PCR: NEGATIVE

## 2013-06-28 MED ORDER — ONDANSETRON HCL 4 MG PO TABS: 4.0000 mg | ORAL_TABLET | Freq: Four times a day (QID) | ORAL | Status: DC | PRN

## 2013-06-28 MED ORDER — CHLORHEXIDINE GLUCONATE 0.12 % MT SOLN
OROMUCOSAL | Status: AC
  Administered 2013-06-28: 15 mL
  Filled 2013-06-28: qty 15

## 2013-06-28 MED ORDER — FUROSEMIDE 10 MG/ML IJ SOLN
80.0000 mg | Freq: Once | INTRAMUSCULAR | Status: DC
  Filled 2013-06-28: qty 8

## 2013-06-28 MED ORDER — ALBUTEROL SULFATE (2.5 MG/3ML) 0.083% IN NEBU: 2.5000 mg | INHALATION_SOLUTION | Freq: Four times a day (QID) | RESPIRATORY_TRACT | Status: DC | PRN

## 2013-06-28 MED ORDER — ACETAMINOPHEN 325 MG PO TABS
650.0000 mg | ORAL_TABLET | Freq: Four times a day (QID) | ORAL | Status: DC | PRN
  Administered 2013-06-29: 650 mg via ORAL
  Filled 2013-06-28: qty 2

## 2013-06-28 MED ORDER — HYDRALAZINE HCL 20 MG/ML IJ SOLN: 10.0000 mg | INTRAMUSCULAR | Status: DC | PRN

## 2013-06-28 MED ORDER — ALBUTEROL SULFATE (5 MG/ML) 0.5% IN NEBU: 2.5000 mg | INHALATION_SOLUTION | RESPIRATORY_TRACT | Status: DC | PRN

## 2013-06-28 MED ORDER — BUDESONIDE 0.5 MG/2ML IN SUSP
0.5000 mg | Freq: Two times a day (BID) | RESPIRATORY_TRACT | Status: DC
  Administered 2013-06-28 – 2013-07-01 (×4): 0.5 mg via RESPIRATORY_TRACT
  Filled 2013-06-28 (×9): qty 2

## 2013-06-28 MED ORDER — ACETAMINOPHEN 650 MG RE SUPP: 650.0000 mg | Freq: Four times a day (QID) | RECTAL | Status: DC | PRN

## 2013-06-28 MED ORDER — ENOXAPARIN SODIUM 30 MG/0.3ML ~~LOC~~ SOLN
30.0000 mg | Freq: Every day | SUBCUTANEOUS | Status: DC
  Filled 2013-06-28: qty 0.3

## 2013-06-28 MED ORDER — ONDANSETRON HCL 4 MG/2ML IJ SOLN: 4.0000 mg | Freq: Four times a day (QID) | INTRAMUSCULAR | Status: DC | PRN

## 2013-06-28 MED ORDER — FLUTICASONE PROPIONATE 50 MCG/ACT NA SUSP
2.0000 | Freq: Every day | NASAL | Status: DC
  Filled 2013-06-28: qty 16

## 2013-06-28 MED ORDER — SODIUM CHLORIDE 0.9 % IJ SOLN
3.0000 mL | Freq: Two times a day (BID) | INTRAMUSCULAR | Status: DC
  Administered 2013-06-28 – 2013-06-29 (×4): 3 mL via INTRAVENOUS

## 2013-06-28 MED ORDER — MOMETASONE FURO-FORMOTEROL FUM 100-5 MCG/ACT IN AERO
2.0000 | INHALATION_SPRAY | Freq: Two times a day (BID) | RESPIRATORY_TRACT | Status: DC
  Filled 2013-06-28: qty 8.8

## 2013-06-28 MED ORDER — ARFORMOTEROL TARTRATE 15 MCG/2ML IN NEBU
15.0000 ug | INHALATION_SOLUTION | Freq: Two times a day (BID) | RESPIRATORY_TRACT | Status: DC
  Administered 2013-06-28 – 2013-07-01 (×5): 15 ug via RESPIRATORY_TRACT
  Filled 2013-06-28 (×11): qty 2

## 2013-06-28 MED ORDER — MIDAZOLAM HCL 2 MG/2ML IJ SOLN
INTRAMUSCULAR | Status: AC
  Filled 2013-06-28: qty 4

## 2013-06-28 MED ORDER — FENTANYL CITRATE 0.05 MG/ML IJ SOLN
INTRAMUSCULAR | Status: AC
  Filled 2013-06-28: qty 2

## 2013-06-28 MED ORDER — FUROSEMIDE 80 MG PO TABS
120.0000 mg | ORAL_TABLET | Freq: Two times a day (BID) | ORAL | Status: DC
  Filled 2013-06-28 (×3): qty 1

## 2013-06-28 MED ORDER — SODIUM CHLORIDE 0.9 % IJ SOLN
3.0000 mL | Freq: Two times a day (BID) | INTRAMUSCULAR | Status: DC
  Administered 2013-06-28 – 2013-06-30 (×3): 3 mL via INTRAVENOUS

## 2013-06-28 MED ORDER — METOPROLOL SUCCINATE ER 100 MG PO TB24
100.0000 mg | ORAL_TABLET | Freq: Every day | ORAL | Status: DC
  Filled 2013-06-28: qty 1

## 2013-06-28 MED ORDER — HEPARIN SODIUM (PORCINE) 5000 UNIT/ML IJ SOLN
5000.0000 [IU] | Freq: Three times a day (TID) | INTRAMUSCULAR | Status: DC
  Administered 2013-06-28 – 2013-07-01 (×9): 5000 [IU] via SUBCUTANEOUS
  Filled 2013-06-28 (×14): qty 1

## 2013-06-28 NOTE — Progress Notes (Signed)
Utilization review completed. Briant Angelillo, RN, BSN. 

## 2013-06-28 NOTE — Progress Notes (Signed)
INITIAL NUTRITION ASSESSMENT  DOCUMENTATION CODES Per approved criteria  -Obesity Unspecified    INTERVENTION: Advance diet as medically appropriate, add interventions accordingly RD to follow for nutrition care plan  NUTRITION DIAGNOSIS: Inadequate oral intake related to inability to eat as evidenced by NPO status  Goal: Pt to meet >/= 90% of their estimated nutrition needs   Monitor:  PO diet advancement & intake, weight, labs, I/O's  Reason for Assessment: Malnutrition Screening Tool Report  53 y.o. female  Admitting Dx: Acute encephalopathy  ASSESSMENT: Patient with PMH of ESRD presently not on dialysis and has had recent right upper extremity graft, COPD, bipolar disorder; brought to the ER after patient was found to be increasingly lethargic; patient's creatinine was found to have doubled with the last 2 weeks.   RD unable to obtain nutrition hx from patient; currently on Venturi mask and somnolent; per H&P daughter reported patient had no been eating well PTA; no family at bedside -- RD unable to obtain diet recall; patient's weight has trended up -- more than likely given + trace edema; possible HD during hospitalization.  Nutrition focused physical exam completed.  No muscle or subcutaneous fat depletion noticed.  Height: Ht Readings from Last 1 Encounters:  06/28/13 5\' 2"  (1.575 m)    Weight: Wt Readings from Last 1 Encounters:  06/28/13 220 lb 0.3 oz (99.8 kg)    Ideal Body Weight: 110 lb  % Ideal Body Weight: 200%  Wt Readings from Last 10 Encounters:  06/28/13 220 lb 0.3 oz (99.8 kg)  06/11/13 213 lb (96.616 kg)  04/26/13 210 lb 5.1 oz (95.4 kg)  02/20/13 208 lb 12.8 oz (94.711 kg)  01/22/13 199 lb 1.2 oz (90.3 kg)  11/13/12 209 lb 3.5 oz (94.9 kg)  11/13/12 209 lb 3.5 oz (94.9 kg)  07/31/12 198 lb 6.6 oz (90 kg)  02/21/12 204 lb 9.4 oz (92.8 kg)    Usual Body Weight: 213 lbs  % Usual Body Weight: 103%  BMI:  39.0 kg/m2 (using UBW given  edema)  Estimated Nutritional Needs: Kcal: 1700-1900 Protein: 75-85 gm Fluid: per MD  Skin: Intact  Diet Order: NPO  EDUCATION NEEDS: -No education needs identified at this time   Intake/Output Summary (Last 24 hours) at 06/28/13 1123 Last data filed at 06/28/13 1100  Gross per 24 hour  Intake      0 ml  Output    285 ml  Net   -285 ml    Labs:   Recent Labs Lab 06/27/13 1737 06/28/13 0615  NA 136* 140  K 5.2 4.7  CL 98 101  CO2 20 23  BUN 85* 88*  CREATININE 7.11* 7.82*  CALCIUM 9.5 9.3  GLUCOSE 89 100*    CBG (last 3)   Recent Labs  06/27/13 1717 06/28/13 0457 06/28/13 0840  GLUCAP 91 108* 87    Scheduled Meds: . arformoterol  15 mcg Nebulization Q12H  . budesonide (PULMICORT) nebulizer solution  0.5 mg Nebulization Q12H  . fentaNYL      . heparin subcutaneous  5,000 Units Subcutaneous 3 times per day  . midazolam      . sodium chloride  3 mL Intravenous Q12H  . sodium chloride  3 mL Intravenous Q12H    Continuous Infusions:   Past Medical History  Diagnosis Date  . Mental disorder   . Depression   . Hypertension   . Overdose   . Tobacco use disorder 11/13/2012  . Complication of anesthesia  difficulty going to sleep  . Chronic kidney disease     06/11/13- not on dialysis  . Shortness of breath     lying down flat  . Diabetes mellitus without complication     denies  . PTSD (post-traumatic stress disorder)   . Asthma   . COPD (chronic obstructive pulmonary disease)   . Heart murmur   . GERD (gastroesophageal reflux disease)   . Seizures     "passsed out"  . History of blood transfusion     Past Surgical History  Procedure Laterality Date  . Right knee replacement      she says it was last year.  . Esophagogastroduodenoscopy Left 11/14/2012    Procedure: ESOPHAGOGASTRODUODENOSCOPY (EGD);  Surgeon: [name], MD;  Location: [facility];  Service: Endoscopy;  Laterality: Left;  . Joint replacement Right     knee  .  Parathyroidectomy    . Av fistula placement Right 06/12/2013    Procedure: ARTERIOVENOUS (AV) FISTULA CREATION; RIGHT  BASILIC VEIN TRANSPOSITION with Intraoperative ultrasound;  Surgeon: Mal [name], MD;  Location: [place];  Service: Vascular;  Laterality: Right;    Arthur Holms, RD, LDN Pager #: [phone] After-Hours Pager #: [phone]

## 2013-06-28 NOTE — Progress Notes (Signed)
Additional CCM time of 30 mins -   Pt reassessed twice on CCM rounds this AM She is lethargic but able to engage and converse She has no excessive WOB off NPPV She is able to maintain SpO2 > 90% on 1/2 lpm by [place]  Exam notable for lethargy, mild asterixis (?hypercarbia vs uremia vs gabapentin toxicity), clear chest without adventitious sounds  Despite the demonstrated hypercarbia, I elected to not intubate her and to follow her clinically. As long as cognition is adequate, WOB not excessive and oxygenation acceptable, we can follow without repeated ABGs.   She will remain NPO for next 24 hrs  Renal service following - discussed with Dr Isa Rankin, MD ; [facility] service Mobile [phone].  After 5:30 PM or weekends, call [phone]

## 2013-06-28 NOTE — Progress Notes (Signed)
Updates given to Jennifer Lawson  from  [employer] .

## 2013-06-28 NOTE — Progress Notes (Signed)
Patient awake and alert in bed watching TV complaining of hunger. Her mental and respiratory status are significantly improved compared to earlier today and I feel she will do well to progress her diet. Called Gretchen at [facility] and she discussed with Dr. Lake Bells. Will keep to clear/full liquids tonight and reassess in the morning.

## 2013-06-28 NOTE — Progress Notes (Addendum)
Name: Jennifer Lawson  MRN: [id number]  DOB: 12-12-1960   ADMISSION DATE: 06/27/2013  CONSULTATION DATE: 2/20   REFERRING MD : Hal Hope  PRIMARY SERVICE: [facility] and Nephrology consultant   CHIEF COMPLAINT: ams and hypercarbic resp failure   BRIEF PATIENT DESCRIPTION:  83 yof w/ ESRD (not yet on HD). Presented 2/19 for unintentional overdose of Geodon, gabapentin w/ resultant altered mental status and Hypercarbic resp failure.  >transfered to [facility] on 2/20 as scr had more than doubled from two weeks prior.   SIGNIFICANT EVENTS / STUDIES:  CT head 2/20: neg   LINES / TUBES:   CULTURES:   ANTIBIOTICS:   SUBJECTIVE:  Looks a little better w/ current mask  VITAL SIGNS: Temp:  [98.1 F (36.7 C)-99.8 F (37.7 C)] 98.4 F (36.9 C) (02/20 0408) Pulse Rate:  [62-98] 73 (02/20 0408) Resp:  [10-18] 11 (02/20 0408) BP: (102-119)/(53-86) 102/59 mmHg (02/20 0408) SpO2:  [77 %-100 %] 99 % (02/20 0408) FiO2 (%):  [50 %] 50 % (02/20 0408) Weight:  [99.8 kg (220 lb 0.3 oz)] 99.8 kg (220 lb 0.3 oz) (02/20 0408) HEMODYNAMICS:   VENTILATOR SETTINGS: Vent Mode:  [-] BIPAP FiO2 (%):  [50 %] 50 % Set Rate:  [12 bmp] 12 bmp INTAKE / OUTPUT: Intake/Output     02/19 0701 - 02/20 0700   Urine (mL/kg/hr) 175   Total Output 175   Net -175         PHYSICAL EXAMINATION:  General: Drowsy but arousble  Neuro: Sp slurred. Oriented X 3, moves all ext  HEENT: edentulous  Cardiovascular: rrr III/VI SEM  Lungs: Scattered rhonchi, no accessory muscle use  Abdomen: Soft, + bowel sounds  Musculoskeletal: Intact  Skin: Intact    LABS:  CBC  Recent Labs Lab 06/27/13 1737  WBC 7.1  HGB 9.7*  HCT 30.6*  PLT 278   Coag's No results found for this basename: APTT, INR,  in the last 168 hours BMET  Recent Labs Lab 06/27/13 1737  NA 136*  K 5.2  CL 98  CO2 20  BUN 85*  CREATININE 7.11*  GLUCOSE 89   Electrolytes  Recent Labs Lab 06/27/13 1737  CALCIUM 9.5    Sepsis Markers No results found for this basename: LATICACIDVEN, PROCALCITON, O2SATVEN,  in the last 168 hours ABG  Recent Labs Lab 06/27/13 2033 06/27/13 2240 06/28/13 0400  PHART 7.181* 7.206* 7.183*  PCO2ART 61.0* 58.2* 64.1*  PO2ART 74.9* 175.0* 109.0*   Liver Enzymes  Recent Labs Lab 06/27/13 1737  AST 41*  ALT 51*  ALKPHOS 87  BILITOT <0.2*  ALBUMIN 3.1*   Cardiac Enzymes No results found for this basename: TROPONINI, PROBNP,  in the last 168 hours Glucose  Recent Labs Lab 06/27/13 1717 06/28/13 0457  GLUCAP 91 108*    Imaging Dg Chest 1 View  06/27/2013   CLINICAL DATA:  Shortness of breath.  EXAM: CHEST - 1 VIEW  COMPARISON:  June 12, 2013.  FINDINGS: Mild cardiomegaly is noted. Mild central pulmonary vascular congestion is noted. No pneumothorax or pleural effusion is noted. Linear density is noted laterally in the left lung most consistent with subsegmental atelectasis. Bony thorax appears intact.  IMPRESSION: Focus of subsegmental atelectasis seen laterally in left lung. Mild cardiomegaly and central pulmonary vascular congestion is noted.   Electronically Signed   By: Sabino Dick M.D.   On: 06/27/2013 18:12   Ct Head Wo Contrast  06/27/2013   CLINICAL DATA:  Altered mental status  EXAM: CT HEAD WITHOUT CONTRAST  TECHNIQUE: Contiguous axial images were obtained from the base of the skull through the vertex without intravenous contrast.  COMPARISON:  MRI brain dated 01/19/2013  FINDINGS: No evidence of parenchymal hemorrhage or extra-axial fluid collection. No mass lesion, mass effect, or midline shift.  No CT evidence of acute infarction.  Subcortical white matter and periventricular small vessel ischemic changes.  Partial opacification of the left ethmoid and maxillary sinuses. Mastoid air cells are clear.  No evidence of calvarial fracture.  IMPRESSION: No evidence of acute intracranial abnormality.   Electronically Signed   By: Julian Hy M.D.    On: 06/27/2013 18:27     CXR: CM, vasc congestion   ASSESSMENT / PLAN:  PULMONARY A: acute hypercarbic respiratory failure secondary to unintentional overdose, and likely further c/b uremia and element of volume overload.  Has h/o COPD. Do not think that this is an exacerbation  >has not really improved initially w/ BIPAP. Have switched her to NIPPV w/ different mask. Seems like it has better seal, but gas exchange not improved.  P:   Transfer to ICU  Little else to offer her from NIPPV stand-point. Think we need to consider intubation Cont scheduled brovana/budesonide Think she would benefit from HD.   CARDIOVASCULAR A: pulm edema  Hypotension P:  BP low. Will hold off on lasix & metoprololfor now.   RENAL A:  Acute on chronic renal failure       borderline hyperkalemia       Inadequate compensation for resp acidosis P:   Renal consult pending  If she is to require HD will need vasc cath as his graft is not mature   GASTROINTESTINAL A:  No acute  P:   Stress dose PPI  HEMATOLOGIC A:  Anemia of chronic disease.  Likely CKD related.  P:   heparin  Trend CBC    INFECTIOUS A:  No acute  P:   Trend fever curve   ENDOCRINE A:  H/O DM  P:   Trend CBG   NEUROLOGIC A:  Acute encephalopathy  >likely multifactorial: (residual Geodon and neurontin effect) +/- hypercarbia and progressive uremia  P:   Cont supportive care Holding all sedating meds Think she may need HD   TODAY'S SUMMARY: still drowsy but not difficult to arouse. Changed her to NIPPV from older BIPAP, this mask seems to fit her better, but really made no improvement in her ABG. Although some of this is likely residual drug effect, also wonder about role of uremia here and hypersomnolence as a contributing factor. Little else to offer non-invasive wise. Will move her to the ICU. Think we may need to consider intubation.    Summary - reviewed course over past few hours, serial ABGs have not shown  improvement, she arouses to name but then drifts back to sleep. Transfer to ICU, Geodon & neurontin can last for 12-24h ,may be best to provide mechanical ventilation until then. Also will likely need to HD started given acute worsening of renal function  I have personally obtained a history, examined the patient, evaluated laboratory and imaging results, formulated the assessment and plan and placed orders. CRITICAL CARE: The patient is critically ill with multiple organ systems failure and requires high complexity decision making for assessment and support, frequent evaluation and titration of therapies, application of advanced monitoring technologies and extensive interpretation of multiple databases. Critical Care Time devoted to patient care services described in this note is  60 minutes.   Rigoberto Noel Pulmonary and [place] Pager: [phone]  06/28/2013, 5:30 AM

## 2013-06-28 NOTE — Significant Event (Signed)
Pt examined.  MS about the same, a little more awake, but ABG worse. Her earlier CXR did have some degree of vascular congestion. Wonder if she might benefit for HD for volume removal.  ABG    Component Value Date/Time   PHART 7.183* 06/28/2013 0400   PCO2ART 64.1* 06/28/2013 0400   PO2ART 109.0* 06/28/2013 0400   HCO3 23.1 06/28/2013 0400   TCO2 25.1 06/28/2013 0400   ACIDBASEDEF 4.1* 06/28/2013 0400   O2SAT 98.0 06/28/2013 0400    Plan We will try NIPPV w/ servo Ck CXR now Rpt ABG at 0530. If not improved might need to consider intubation.   Marni Griffon ACNP-BC [place] Pager # [phone] OR # [phone] if no answer

## 2013-06-28 NOTE — Consult Note (Signed)
Name: Jennifer Lawson MRN: [id number] DOB: [birth year]    ADMISSION DATE:  06/27/2013 CONSULTATION DATE:  2/20   REFERRING MD :  Hal Hope  PRIMARY SERVICE:  [facility]  PCCM and Nephrology consultant   CHIEF COMPLAINT:  ams and hypercarbic resp failure   BRIEF PATIENT DESCRIPTION:  53 yof w/ ESRD (not yet on HD). Presented 2/19 (tr from [facility]) for unintentional overdose of Geodone, gabapentin w/ resultant altered mental status and Hypercarbic resp failure.  >transfered to [facility] on 2/20 as scr had more than doubled from two weeks prior.   PMH - 'COPD' -20 Pyrs, bipolar, CKD s/p RUE AVG  SIGNIFICANT EVENTS / STUDIES:  CT head 2/20: neg   LINES / TUBES:   CULTURES:   ANTIBIOTICS:   HISTORY OF PRESENT ILLNESS:   53 y.o. female with history of end-stage renal disease presently not on dialysis and has had recent right upper extremity graft, COPD, bipolar disorder was brought to the ER 2/19 after patient was found to be increasingly lethargic t/o most of the day. As per patient's daughter patient had taken 2 more of her Neurontin the night prior as she was not feeling sleepy. Patient confirms that she has not taken any other medications. In the ER patient was somnolent but easily arousable and follows commands. CT head was negative. Salicylate and Tylenol levels were undetectable. Depakote level was low. Patient's creatinine was found to have doubled with the last 2 weeks. Patient has chronic shortness of breath but otherwise denied any chest pain nausea vomiting abdominal pain diarrhea. Patient daughter stated that patient has not been eating well recently and has been having increasing jerking of the extremities. Patient denies any suicidal ideation. An ABG was obtained that showed Ph 7.1/PCO2 61/PO2 75 and bicarb of 22. The pt was placed on NIPPV w/ marginal improvement in her gas exchange. PCCM was asked to see for hypercarbic respiratory failure.    PAST MEDICAL HISTORY :  Past  Medical History  Diagnosis Date  . Mental disorder   . Depression   . Hypertension   . Overdose   . Tobacco use disorder 11/13/2012  . Complication of anesthesia     difficulty going to sleep  . Chronic kidney disease     06/11/13- not on dialysis  . Shortness of breath     lying down flat  . Diabetes mellitus without complication     denies  . PTSD (post-traumatic stress disorder)   . Asthma   . COPD (chronic obstructive pulmonary disease)   . Heart murmur   . GERD (gastroesophageal reflux disease)   . Seizures     "passsed out"  . History of blood transfusion    Past Surgical History  Procedure Laterality Date  . Right knee replacement      she says it was last year.  . Esophagogastroduodenoscopy Left 11/14/2012    Procedure: ESOPHAGOGASTRODUODENOSCOPY (EGD);  Surgeon: [name], MD;  Location: [facility];  Service: Endoscopy;  Laterality: Left;  . Joint replacement Right     knee  . Parathyroidectomy    . Av fistula placement Right 06/12/2013    Procedure: ARTERIOVENOUS (AV) FISTULA CREATION; RIGHT  BASILIC VEIN TRANSPOSITION with Intraoperative ultrasound;  Surgeon: Mal [name], MD;  Location: [facility];  Service: Vascular;  Laterality: Right;   Prior to Admission medications   Medication Sig Start Date End Date Taking? Authorizing Provider  albuterol (PROVENTIL HFA;VENTOLIN HFA) 108 (90 BASE) MCG/ACT inhaler Inhale 2 puffs into the lungs  every 6 (six) hours as needed for wheezing or shortness of breath. 04/26/13  Yes Erline Hau, MD  albuterol (PROVENTIL) (5 MG/ML) 0.5% nebulizer solution Take 2.5 mg by nebulization every 4 (four) hours as needed for wheezing or shortness of breath. 04/26/13  Yes Erline Hau, MD  divalproex (DEPAKOTE) 500 MG DR tablet Take 1,000 mg by mouth at bedtime.    Yes Historical Provider, MD  fluticasone (FLONASE) 50 MCG/ACT nasal spray Place 2 sprays into both nostrils daily.   Yes Historical Provider, MD    Fluticasone-Salmeterol (ADVAIR) 100-50 MCG/DOSE AEPB Inhale 1 puff into the lungs 2 (two) times daily. 04/26/13  Yes Erline Hau, MD  furosemide (LASIX) 80 MG tablet Take 120 mg by mouth 2 (two) times daily. Take 1 1/2 tablet by mouth two times daily.   Yes Historical Provider, MD  gabapentin (NEURONTIN) 600 MG tablet Take 1,800 mg by mouth at bedtime. 04/26/13  Yes Erline Hau, MD  hydrOXYzine (ATARAX/VISTARIL) 50 MG tablet Take 50 mg by mouth at bedtime.   Yes Historical Provider, MD  metoprolol succinate (TOPROL-XL) 100 MG 24 hr tablet Take 100 mg by mouth daily. Take with or immediately following a meal.   Yes Historical Provider, MD  Multiple Vitamins-Minerals (CENTRUM SILVER PO) Take 1 tablet by mouth daily.   Yes Historical Provider, MD  ziprasidone (GEODON) 60 MG capsule Take 120 mg by mouth at bedtime.   Yes Historical Provider, MD   Allergies  Allergen Reactions  . Codeine Sulfate Anaphylaxis    Daughter called about having this allergy   . Haldol [Haloperidol Lactate] Shortness Of Breath  . Risperidone And Related Shortness Of Breath    FAMILY HISTORY:  Family History  Problem Relation Age of Onset  . Diabetes Mother   . Hyperlipidemia Mother   . Hypertension Mother   . Diabetes Father   . Hypertension Father   . Hyperlipidemia Father    SOCIAL HISTORY:  reports that she has been smoking Cigarettes.  She has a 20 pack-year smoking history. She has never used smokeless tobacco. She reports that she uses illicit drugs. She reports that she does not drink alcohol.  REVIEW OF SYSTEMS:   Unable   SUBJECTIVE:  Wants water  VITAL SIGNS: Temp:  [98.1 F (36.7 C)-98.9 F (37.2 C)] 98.1 F (36.7 C) (02/20 0013) Pulse Rate:  [66-71] 67 (02/20 0013) Resp:  [10-17] 12 (02/20 0013) BP: (103-115)/(53-85) 103/57 mmHg (02/20 0013) SpO2:  [77 %-100 %] 100 % (02/20 0013)  PHYSICAL EXAMINATION: General:  Drowsy but arousble  Neuro:  Sp slurred.  Oriented X 3, moves all ext  HEENT:  edentulous  Cardiovascular:  rrr III/VI SEM  Lungs:  Scattered rhonchi, no accessory muscle use  Abdomen:  Soft, + bowel sounds  Musculoskeletal:  Intact  Skin:  Intact    Recent Labs Lab 06/27/13 1737  NA 136*  K 5.2  CL 98  CO2 20  BUN 85*  CREATININE 7.11*  GLUCOSE 89    Recent Labs Lab 06/27/13 1737  HGB 9.7*  HCT 30.6*  WBC 7.1  PLT 278   Dg Chest 1 View  06/27/2013   CLINICAL DATA:  Shortness of breath.  EXAM: CHEST - 1 VIEW  COMPARISON:  June 12, 2013.  FINDINGS: Mild cardiomegaly is noted. Mild central pulmonary vascular congestion is noted. No pneumothorax or pleural effusion is noted. Linear density is noted laterally in the left lung most consistent with subsegmental  atelectasis. Bony thorax appears intact.  IMPRESSION: Focus of subsegmental atelectasis seen laterally in left lung. Mild cardiomegaly and central pulmonary vascular congestion is noted.   Electronically Signed   By: Sabino Dick M.D.   On: 06/27/2013 18:12   Ct Head Wo Contrast  06/27/2013   CLINICAL DATA:  Altered mental status  EXAM: CT HEAD WITHOUT CONTRAST  TECHNIQUE: Contiguous axial images were obtained from the base of the skull through the vertex without intravenous contrast.  COMPARISON:  MRI brain dated 01/19/2013  FINDINGS: No evidence of parenchymal hemorrhage or extra-axial fluid collection. No mass lesion, mass effect, or midline shift.  No CT evidence of acute infarction.  Subcortical white matter and periventricular small vessel ischemic changes.  Partial opacification of the left ethmoid and maxillary sinuses. Mastoid air cells are clear.  No evidence of calvarial fracture.  IMPRESSION: No evidence of acute intracranial abnormality.   Electronically Signed   By: Julian Hy M.D.   On: 06/27/2013 18:27    ASSESSMENT / PLAN:  1) Acute encephalopathy in setting of unintentional overdose, further complicated by hypercarbia. Poison control  notified. She does appear to have some dystonia, from records sounds like this may be more chronic.  Plan Supportive care Will order QTC monitoring (would do q 4 hrs until MS improved) F/u am  LFTs Hold all sedating meds  2) Acute Hypercarbic respiratory failure secondary to #1. CXR does appear to have some degree of vascular congestion. She is drowsy but able to protect airway.  Prior ABGs show pCO2 40-50 range Plan -cont NIPPV, at risk for intubation. With time the risk should be less.    4) COPD. No evidence of AECOPD Plan Brovana/budesonide for now Hold advair  3) Acute on chronic renal failure likely evolving ESRD. Scr has more than doubled in last 2 weeks. Her graft was just placed on 2/4 so not mature enough for use as of yet. Nephrology has been consulted.  Plan Defer to nephrology.  If needs HD will need perm cath until graft matured   4) h/o bipolar disease Plan Holding sedating meds  5) DM Plan: ssi  6) anemia from CKD Plan: Trend cbc   Marni Griffon ACNP-BC [place] Pager # [phone] OR # [phone] if no answer   Summary - reviewed course over past few hours, serial ABGs have not shown improvement, she arouses to name but then drifts back to sleep. Transfer to ICU, Geodon & neurontin can last for 12-24h ,may be best to provide mechanical ventilation until then. Also will likely need to HD started given acute worsening of renal function  The patient is critically ill with multiple organ systems failure and requires high complexity decision making for assessment and support, frequent evaluation and titration of therapies, application of advanced monitoring technologies and extensive interpretation of multiple databases. Critical Care Time devoted to patient care services described in this note is 60 minutes.    ALVA,RAKESH V.   06/28/2013, 1:04 AM

## 2013-06-28 NOTE — Progress Notes (Signed)
Pt transported on vent non-invasive from 2C15 to 2S14. No complications noted.

## 2013-06-28 NOTE — Progress Notes (Signed)
HPI 53 year old female transferred from [place] after presenting to ED there with altered mental status due to un-intenttional overdose of Geodon and gabapentin.  Subjective: Requesting to eat. Denies pain or dyspnea.  Objective: Vital signs in last 24 hours: Temp:  [98.1 F (36.7 C)-99.8 F (37.7 C)] 99.8 F (37.7 C) (02/20 0123) Pulse Rate:  [62-71] 62 (02/20 0123) Resp:  [10-17] 12 (02/20 0123) BP: (103-119)/(53-86) 119/86 mmHg (02/20 0123) SpO2:  [77 %-100 %] 100 % (02/20 0123)  Intake/Output from previous day: 02/19 0701 - 02/20 0700 In: -  Out: 175 [Urine:175] Intake/Output this shift: Total I/O In: -  Out: 175 [Urine:175]  BP 119/86  Pulse 62  Temp(Src) 99.8 F (37.7 C) (Axillary)  Resp 12  SpO2 100% General appearance: cooperative, no distress and alert to person, place, situation Lungs: clear to auscultation bilaterally Heart: regular rate and rhythm  Results for orders placed during the hospital encounter of 06/27/13 (from the past 24 hour(s))  CBG MONITORING, ED     Status: None   Collection Time    06/27/13  5:17 PM      Result Value Ref Range   Glucose-Capillary 91  70 - 99 mg/dL  CBC     Status: Abnormal   Collection Time    06/27/13  5:37 PM      Result Value Ref Range   WBC 7.1  4.0 - 10.5 K/uL   RBC 3.24 (*) 3.87 - 5.11 MIL/uL   Hemoglobin 9.7 (*) 12.0 - 15.0 g/dL   HCT 30.6 (*) 36.0 - 46.0 %   MCV 94.4  78.0 - 100.0 fL   MCH 29.9  26.0 - 34.0 pg   MCHC 31.7  30.0 - 36.0 g/dL   RDW 16.8 (*) 11.5 - 15.5 %   Platelets 278  150 - 400 K/uL  COMPREHENSIVE METABOLIC PANEL     Status: Abnormal   Collection Time    06/27/13  5:37 PM      Result Value Ref Range   Sodium 136 (*) 137 - 147 mEq/L   Potassium 5.2  3.7 - 5.3 mEq/L   Chloride 98  96 - 112 mEq/L   CO2 20  19 - 32 mEq/L   Glucose, Bld 89  70 - 99 mg/dL   BUN 85 (*) 6 - 23 mg/dL   Creatinine, Ser 7.11 (*) 0.50 - 1.10 mg/dL   Calcium 9.5  8.4 - 10.5 mg/dL   Total Protein 7.7  6.0 -  8.3 g/dL   Albumin 3.1 (*) 3.5 - 5.2 g/dL   AST 41 (*) 0 - 37 U/L   ALT 51 (*) 0 - 35 U/L   Alkaline Phosphatase 87  39 - 117 U/L   Total Bilirubin <0.2 (*) 0.3 - 1.2 mg/dL   GFR calc non Af Amer 6 (*) >90 mL/min   GFR calc Af Amer 7 (*) >90 mL/min  ETHANOL     Status: None   Collection Time    06/27/13  5:37 PM      Result Value Ref Range   Alcohol, Ethyl (B) <11  0 - 11 mg/dL  SALICYLATE LEVEL     Status: Abnormal   Collection Time    06/27/13  5:37 PM      Result Value Ref Range   Salicylate Lvl <5.3 (*) 2.8 - 20.0 mg/dL  ACETAMINOPHEN LEVEL     Status: None   Collection Time    06/27/13  5:37 PM  Result Value Ref Range   Acetaminophen (Tylenol), Serum <15.0  10 - 30 ug/mL  VALPROIC ACID LEVEL     Status: Abnormal   Collection Time    06/27/13  5:37 PM      Result Value Ref Range   Valproic Acid Lvl 44.0 (*) 50.0 - 100.0 ug/mL  AMMONIA     Status: None   Collection Time    06/27/13  8:00 PM      Result Value Ref Range   Ammonia RESULTS UNAVAILABLE DUE TO INTERFERING SUBSTANCE  11 - 60 umol/L  BLOOD GAS, ARTERIAL     Status: Abnormal   Collection Time    06/27/13  8:33 PM      Result Value Ref Range   FIO2 0.28     Delivery systems NASAL CANNULA     pH, Arterial 7.181 (*) 7.350 - 7.450   pCO2 arterial 61.0 (*) 35.0 - 45.0 mmHg   pO2, Arterial 74.9 (*) 80.0 - 100.0 mmHg   Bicarbonate 21.9  20.0 - 24.0 mEq/L   TCO2 21.6  0 - 100 mmol/L   Acid-base deficit 6.2 (*) 0.0 - 2.0 mmol/L   O2 Saturation 91.5     Patient temperature 98.6     Collection site LEFT RADIAL     Drawn by [id number]     Sample type ARTERIAL DRAW     Allens test (pass/fail) PASS  PASS  URINE RAPID DRUG SCREEN (HOSP PERFORMED)     Status: None   Collection Time    06/27/13  9:55 PM      Result Value Ref Range   Opiates NONE DETECTED  NONE DETECTED   Cocaine NONE DETECTED  NONE DETECTED   Benzodiazepines NONE DETECTED  NONE DETECTED   Amphetamines NONE DETECTED  NONE DETECTED    Tetrahydrocannabinol NONE DETECTED  NONE DETECTED   Barbiturates NONE DETECTED  NONE DETECTED  BLOOD GAS, ARTERIAL     Status: Abnormal   Collection Time    06/27/13 10:40 PM      Result Value Ref Range   FIO2 0.50     Delivery systems BILEVEL POSITIVE AIRWAY PRESSURE     Mode BILEVEL POSITIVE AIRWAY PRESSURE     Inspiratory PAP 18     Expiratory PAP 5.0     pH, Arterial 7.206 (*) 7.350 - 7.450   pCO2 arterial 58.2 (*) 35.0 - 45.0 mmHg   pO2, Arterial 175.0 (*) 80.0 - 100.0 mmHg   Bicarbonate 22.2  20.0 - 24.0 mEq/L   TCO2 21.7  0 - 100 mmol/L   Acid-base deficit 5.4 (*) 0.0 - 2.0 mmol/L   O2 Saturation 98.6     Patient temperature 98.7     Collection site LEFT RADIAL     Drawn by [id number]     Sample type ARTERIAL DRAW     Allens test (pass/fail) PASS  PASS    Studies/Results: Dg Chest 1 View  06/27/2013   CLINICAL DATA:  Shortness of breath.  EXAM: CHEST - 1 VIEW  COMPARISON:  June 12, 2013.  FINDINGS: Mild cardiomegaly is noted. Mild central pulmonary vascular congestion is noted. No pneumothorax or pleural effusion is noted. Linear density is noted laterally in the left lung most consistent with subsegmental atelectasis. Bony thorax appears intact.  IMPRESSION: Focus of subsegmental atelectasis seen laterally in left lung. Mild cardiomegaly and central pulmonary vascular congestion is noted.   Electronically Signed   By: Sabino Dick M.D.   On: 06/27/2013 18:12  Dg Chest 2 View  06/12/2013   CLINICAL DATA:  Right upper extremity AV fistula. Shortness of breath  EXAM: CHEST  2 VIEW  COMPARISON:  DG CHEST 2 VIEW dated 04/23/2013  FINDINGS: The heart size and mediastinal contours are within normal limits. Both lungs are clear. The visualized skeletal structures are unremarkable.  IMPRESSION: No active cardiopulmonary disease.   Electronically Signed   By: Kathreen Devoid   On: 06/12/2013 10:12   Ct Head Wo Contrast  06/27/2013   CLINICAL DATA:  Altered mental status  EXAM: CT HEAD  WITHOUT CONTRAST  TECHNIQUE: Contiguous axial images were obtained from the base of the skull through the vertex without intravenous contrast.  COMPARISON:  MRI brain dated 01/19/2013  FINDINGS: No evidence of parenchymal hemorrhage or extra-axial fluid collection. No mass lesion, mass effect, or midline shift.  No CT evidence of acute infarction.  Subcortical white matter and periventricular small vessel ischemic changes.  Partial opacification of the left ethmoid and maxillary sinuses. Mastoid air cells are clear.  No evidence of calvarial fracture.  IMPRESSION: No evidence of acute intracranial abnormality.   Electronically Signed   By: Julian Hy M.D.   On: 06/27/2013 18:27   Dg Knee Complete 4 Views Left  06/13/2013   CLINICAL DATA:  Pain in the knee.  EXAM: LEFT KNEE - COMPLETE 4+ VIEW  COMPARISON:  None.  FINDINGS: There is no evidence of fracture, dislocation. There is a small suprapatellar effusion. There are degenerative joint changes of the left knee with osteophyte formation. Soft tissues are unremarkable.  IMPRESSION: No acute fracture or dislocation.   Electronically Signed   By: Abelardo Diesel M.D.   On: 06/13/2013 15:16    Scheduled Meds: . enoxaparin (LOVENOX) injection  30 mg Subcutaneous Daily  . fluticasone  2 spray Each Nare Daily  . furosemide  120 mg Oral BID  . metoprolol succinate  100 mg Oral Daily  . mometasone-formoterol  2 puff Inhalation BID  . sodium chloride  3 mL Intravenous Q12H  . sodium chloride  3 mL Intravenous Q12H   Continuous Infusions:  PRN Meds:acetaminophen, acetaminophen, albuterol, hydrALAZINE, ondansetron (ZOFRAN) IV, ondansetron  Assessment/Plan: (See H&P from Dr. Hal Hope)   Acute encephalopathy. Mental status improving. Easily arousable. Seen by CCM. Will repeat ABG around 4am. Monitoring EKG every 4 hours for QT changes.   ESRD. Monitoring urine output.   LOS: 1 day   Jennifer Lawson A.

## 2013-06-28 NOTE — Progress Notes (Signed)
Patient's ABG not improved , Dr. Elsworth Soho and Laurey Arrow NP are at pt's  bedside. We were informed that patient will be transferred to ICU. We are now awaiting a bed.

## 2013-06-28 NOTE — Progress Notes (Signed)
Arterial Blood Gas results called to Marni Griffon ACNP

## 2013-06-28 NOTE — Consult Note (Signed)
Reason for Consult:AKI/CKD Referring Physician: Alva Garnet, MD  Jennifer Lawson is an 53 y.o. female.  HPI: Pt is a 89yo AAF with PMH sig for bipolar disorder, HTN, primary hyperparathyroidism, and CKD stage 4 (secondary to lithium toxicity and NSAID's with baseline Scr of 2.5-2.9) who presented to [facility] with h/o worsening mental status after taking extra gabapentin, geodan, and depakote.  She recently had an AVF placed 2 weeks ago, however her Scr had remained stable.  She was noted to have a markedly elevated BUN/Cr and trend in Scr is seen below.    We were asked to evaluate the patient for the possible need to initiate HD.  She has had issues in the past with compliance with medications and has admitted to "forgetting" to take her fluid pills and now has taken extra medications for unclear reasons. Of note, her gabapentin dose is 6 times the recommended dose for her level of renal function and is likely the cause of her myoclonic jerking.   She denies taking NSAIDs/COX-II I's, however cannot tell me how many gabapentin's and geodan she took.  Denies N/V/D/SOB/CP but very difficult to get history from patient due to somnolence.   Trend in Creatinine: Creatinine, Ser  Date/Time Value Ref Range Status  06/28/2013  6:15 AM 7.82* 0.50 - 1.10 mg/dL Final  06/27/2013  5:37 PM 7.11* 0.50 - 1.10 mg/dL Final  06/13/2013  2:33 PM 3.01* 0.50 - 1.10 mg/dL Final  04/26/2013  4:53 AM 3.60* 0.50 - 1.10 mg/dL Final  04/25/2013  4:20 AM 3.18* 0.50 - 1.10 mg/dL Final  04/24/2013  4:10 AM 3.12* 0.50 - 1.10 mg/dL Final  04/23/2013  1:20 PM 3.07* 0.50 - 1.10 mg/dL Final  01/22/2013  3:58 AM 2.81* 0.50 - 1.10 mg/dL Final  01/21/2013  5:00 AM 2.63* 0.50 - 1.10 mg/dL Final  01/20/2013  9:05 AM 2.53* 0.50 - 1.10 mg/dL Final  01/19/2013  9:30 AM 2.46* 0.50 - 1.10 mg/dL Final  01/18/2013  4:25 AM 2.61* 0.50 - 1.10 mg/dL Final  01/17/2013  5:30 AM 3.34* 0.50 - 1.10 mg/dL Final  01/16/2013 11:48 PM 3.38* 0.50 - 1.10 mg/dL  Final  11/17/2012  9:32 AM 2.72* 0.50 - 1.10 mg/dL Final  11/14/2012  5:10 AM 2.78* 0.50 - 1.10 mg/dL Final  11/13/2012  3:41 AM 3.12* 0.50 - 1.10 mg/dL Final  11/12/2012  3:35 AM 2.87* 0.50 - 1.10 mg/dL Final  11/11/2012 10:45 AM 2.86* 0.50 - 1.10 mg/dL Final  11/11/2012  4:00 AM 2.93* 0.50 - 1.10 mg/dL Final  11/10/2012  4:30 PM 3.22* 0.50 - 1.10 mg/dL Final  11/10/2012 10:30 AM 3.21* 0.50 - 1.10 mg/dL Final  08/01/2012  5:23 AM 2.78* 0.50 - 1.10 mg/dL Final  07/31/2012  3:35 AM 2.78* 0.50 - 1.10 mg/dL Final  07/30/2012  8:19 PM 2.84* 0.50 - 1.10 mg/dL Final  07/30/2012 12:32 PM 2.70* 0.50 - 1.10 mg/dL Final  07/30/2012  3:33 AM 2.52* 0.50 - 1.10 mg/dL Final  07/30/2012 12:19 AM 2.60* 0.50 - 1.10 mg/dL Final  07/29/2012  8:26 PM 2.70* 0.50 - 1.10 mg/dL Final  02/23/2012  4:35 AM 2.23* 0.50 - 1.10 mg/dL Final  02/22/2012  4:33 AM 2.35* 0.50 - 1.10 mg/dL Final  02/21/2012  4:44 AM 2.80* 0.50 - 1.10 mg/dL Final  02/20/2012 11:15 AM 3.03* 0.50 - 1.10 mg/dL Final  08/30/2011  5:45 PM 2.61* 0.50 - 1.10 mg/dL Final  08/29/2011  5:55 PM 2.22* 0.50 - 1.10 mg/dL Final    PMH:  Past Medical History  Diagnosis Date  . Mental disorder   . Depression   . Hypertension   . Overdose   . Tobacco use disorder 11/13/2012  . Complication of anesthesia     difficulty going to sleep  . Chronic kidney disease     06/11/13- not on dialysis  . Shortness of breath     lying down flat  . Diabetes mellitus without complication     denies  . PTSD (post-traumatic stress disorder)   . Asthma   . COPD (chronic obstructive pulmonary disease)   . Heart murmur   . GERD (gastroesophageal reflux disease)   . Seizures     "passsed out"  . History of blood transfusion     PSH:   Past Surgical History  Procedure Laterality Date  . Right knee replacement      she says it was last year.  . Esophagogastroduodenoscopy Left 11/14/2012    Procedure: ESOPHAGOGASTRODUODENOSCOPY (EGD);  Surgeon: [name], MD;  Location: [facility];   Service: Endoscopy;  Laterality: Left;  . Joint replacement Right     knee  . Parathyroidectomy    . Av fistula placement Right 06/12/2013    Procedure: ARTERIOVENOUS (AV) FISTULA CREATION; RIGHT  BASILIC VEIN TRANSPOSITION with Intraoperative ultrasound;  Surgeon: Mal [name], MD;  Location: [place];  Service: Vascular;  Laterality: Right;    Allergies:  Allergies  Allergen Reactions  . Codeine Sulfate Anaphylaxis    Daughter called about having this allergy   . Haldol [Haloperidol Lactate] Shortness Of Breath  . Risperidone And Related Shortness Of Breath    Medications:   Prior to Admission medications   Medication Sig Start Date End Date Taking? Authorizing Provider  albuterol (PROVENTIL HFA;VENTOLIN HFA) 108 (90 BASE) MCG/ACT inhaler Inhale 2 puffs into the lungs every 6 (six) hours as needed for wheezing or shortness of breath. 04/26/13  Yes Erline Hau, MD  albuterol (PROVENTIL) (5 MG/ML) 0.5% nebulizer solution Take 2.5 mg by nebulization every 4 (four) hours as needed for wheezing or shortness of breath. 04/26/13  Yes Erline Hau, MD  divalproex (DEPAKOTE) 500 MG DR tablet Take 1,000 mg by mouth at bedtime.    Yes Historical Provider, MD  fluticasone (FLONASE) 50 MCG/ACT nasal spray Place 2 sprays into both nostrils daily.   Yes Historical Provider, MD  Fluticasone-Salmeterol (ADVAIR) 100-50 MCG/DOSE AEPB Inhale 1 puff into the lungs 2 (two) times daily. 04/26/13  Yes Erline Hau, MD  furosemide (LASIX) 80 MG tablet Take 120 mg by mouth 2 (two) times daily. Take 1 1/2 tablet by mouth two times daily.   Yes Historical Provider, MD  gabapentin (NEURONTIN) 600 MG tablet Take 1,800 mg by mouth at bedtime. 04/26/13  Yes Erline Hau, MD  hydrOXYzine (ATARAX/VISTARIL) 50 MG tablet Take 50 mg by mouth at bedtime.   Yes Historical Provider, MD  metoprolol succinate (TOPROL-XL) 100 MG 24 hr tablet Take 100 mg by mouth daily. Take with  or immediately following a meal.   Yes Historical Provider, MD  Multiple Vitamins-Minerals (CENTRUM SILVER PO) Take 1 tablet by mouth daily.   Yes Historical Provider, MD  ziprasidone (GEODON) 60 MG capsule Take 120 mg by mouth at bedtime.   Yes Historical Provider, MD    Inpatient medications: . arformoterol  15 mcg Nebulization Q12H  . budesonide (PULMICORT) nebulizer solution  0.5 mg Nebulization Q12H  . fentaNYL      . heparin subcutaneous  5,000 Units Subcutaneous 3 times per day  . midazolam      . sodium chloride  3 mL Intravenous Q12H  . sodium chloride  3 mL Intravenous Q12H    Discontinued Meds:   Medications Discontinued During This Encounter  Medication Reason  . oxyCODONE (ROXICODONE) 5 MG immediate release tablet Side effect (s)  . albuterol (PROVENTIL) (5 MG/ML) 0.5% nebulizer solution 2.5 mg   . mometasone-formoterol (DULERA) 100-5 MCG/ACT inhaler 2 puff   . furosemide (LASIX) tablet 120 mg   . metoprolol succinate (TOPROL-XL) 24 hr tablet 100 mg   . furosemide (LASIX) injection 80 mg   . enoxaparin (LOVENOX) injection 30 mg   . fluticasone (FLONASE) 50 MCG/ACT nasal spray 2 spray   . hydrALAZINE (APRESOLINE) injection 10 mg     Social History:  reports that she has been smoking Cigarettes.  She has a 20 pack-year smoking history. She has never used smokeless tobacco. She reports that she uses illicit drugs. She reports that she does not drink alcohol.  Family History:   Family History  Problem Relation Age of Onset  . Diabetes Mother   . Hyperlipidemia Mother   . Hypertension Mother   . Diabetes Father   . Hypertension Father   . Hyperlipidemia Father     Review of systems not obtained due to patient factors. Weight change:   Intake/Output Summary (Last 24 hours) at 06/28/13 0938 Last data filed at 06/28/13 0800  Gross per 24 hour  Intake      0 ml  Output    255 ml  Net   -255 ml   BP 99/55  Pulse 74  Temp(Src) 97.9 F (36.6 C) (Oral)  Resp 17   Ht 5\' 2"  (1.575 m)  Wt 99.8 kg (220 lb 0.3 oz)  BMI 40.23 kg/m2  SpO2 85% Filed Vitals:   06/28/13 0800 06/28/13 0835 06/28/13 0843 06/28/13 0900  BP: 105/55 96/54  99/55  Pulse: 75 81  74  Temp:   97.9 F (36.6 C)   TempSrc:   Oral   Resp: 21 18  17   Height:      Weight:      SpO2: 100% 89%  85%     General appearance: slowed mentation and somnolent but arousable for short periods Head: Normocephalic, without obvious abnormality, atraumatic Neck: no adenopathy, no carotid bruit, no JVD, supple, symmetrical, trachea midline and thyroid not enlarged, symmetric, no tenderness/mass/nodules Resp: scattered rhonchi bilaterally Cardio: regular rate and rhythm, S1, S2 normal, no murmur, click, rub or gallop GI: soft, non-tender; bowel sounds normal; no masses,  no organomegaly Extremities: edema trace Neuro: somnolent with occassional myoclonic jerks, oriented to person and place  Labs: Basic Metabolic Panel:  Recent Labs Lab 06/27/13 1737 06/28/13 0615  NA 136* 140  K 5.2 4.7  CL 98 101  CO2 20 23  GLUCOSE 89 100*  BUN 85* 88*  CREATININE 7.11* 7.82*  ALBUMIN 3.1* 2.7*  CALCIUM 9.5 9.3   Liver Function Tests:  Recent Labs Lab 06/27/13 1737 06/28/13 0615  AST 41* 15  ALT 51* 38*  ALKPHOS 87 74  BILITOT <0.2* <0.2*  PROT 7.7 6.8  ALBUMIN 3.1* 2.7*   No results found for this basename: LIPASE, AMYLASE,  in the last 168 hours  Recent Labs Lab 06/27/13 2000  AMMONIA RESULTS UNAVAILABLE DUE TO INTERFERING SUBSTANCE   CBC:  Recent Labs Lab 06/27/13 1737 06/28/13 0615  WBC 7.1 6.0  NEUTROABS  --  3.7  HGB 9.7* 8.3*  HCT 30.6* 26.6*  MCV 94.4 95.3  PLT 278 213   PT/INR: @LABRCNTIP (inr:5) Cardiac Enzymes: )No results found for this basename: CKTOTAL, CKMB, CKMBINDEX, TROPONINI,  in the last 168 hours CBG:  Recent Labs Lab 06/27/13 1717 06/28/13 0457 06/28/13 0840  GLUCAP 91 108* 87    Iron Studies: No results found for this basename: IRON,  TIBC, TRANSFERRIN, FERRITIN,  in the last 168 hours  Xrays/Other Studies: Dg Chest 1 View  06/27/2013   CLINICAL DATA:  Shortness of breath.  EXAM: CHEST - 1 VIEW  COMPARISON:  June 12, 2013.  FINDINGS: Mild cardiomegaly is noted. Mild central pulmonary vascular congestion is noted. No pneumothorax or pleural effusion is noted. Linear density is noted laterally in the left lung most consistent with subsegmental atelectasis. Bony thorax appears intact.  IMPRESSION: Focus of subsegmental atelectasis seen laterally in left lung. Mild cardiomegaly and central pulmonary vascular congestion is noted.   Electronically Signed   By: Sabino Dick M.D.   On: 06/27/2013 18:12   Ct Head Wo Contrast  06/27/2013   CLINICAL DATA:  Altered mental status  EXAM: CT HEAD WITHOUT CONTRAST  TECHNIQUE: Contiguous axial images were obtained from the base of the skull through the vertex without intravenous contrast.  COMPARISON:  MRI brain dated 01/19/2013  FINDINGS: No evidence of parenchymal hemorrhage or extra-axial fluid collection. No mass lesion, mass effect, or midline shift.  No CT evidence of acute infarction.  Subcortical white matter and periventricular small vessel ischemic changes.  Partial opacification of the left ethmoid and maxillary sinuses. Mastoid air cells are clear.  No evidence of calvarial fracture.  IMPRESSION: No evidence of acute intracranial abnormality.   Electronically Signed   By: Julian Hy M.D.   On: 06/27/2013 18:27   Dg Chest Port 1 View  06/28/2013   CLINICAL DATA:  Congestion.  EXAM: PORTABLE CHEST - 1 VIEW  COMPARISON:  06/27/2013  FINDINGS: Cardiomegaly with vascular congestion. No overt edema. Low lung volumes without confluent opacity or effusion. No acute bony abnormality.  IMPRESSION: Cardiomegaly, vascular congestion.   Electronically Signed   By: Rolm Baptise M.D.   On: 06/28/2013 05:40     Assessment/Plan: 1.  AKI/CKD- likely ischemic ATN given hypotension and volume  depletion.  Agree with holding lasix and follow after IVF's 2. AMS- due to overdose of psychotropic meds- believed to be unintentional.  Cont to hold these meds 3. ABLA on ACDz- drop in Hgb post op.  Follow and transfuse prn.  May need to initiate EPO. Will also follow iron stores 4. Myoclonic jerking- related to gabapentin toxicity.  Cont to hold 5. Vascular access- s/p RUE AVF +T/B, only 10 weeks old.  May need PC if renal function does not return over the next 24-48 hours 6. Hyponatremia- improved with IVF's 7. hyeprkalemia- improved after IVF's 8. Elevated LFT's- follow   Niasha Devins A 06/28/2013, 9:38 AM

## 2013-06-28 NOTE — Progress Notes (Signed)
Report called to RN, pt transferred to 2 S015 VIA bed. Patient was made aware of transfer. All belonging taken with her.

## 2013-06-28 NOTE — Progress Notes (Addendum)
Wasted 73ml etomidate. Witnessed by second Rn and flushed down the sink  Witnessed by Lyla Son, RN.

## 2013-06-28 NOTE — Progress Notes (Signed)
Jennifer Lawson [facility] NP visited with patient. Order received for chest X-ray, ABG and a change of Bi Pap machine.  Patient remain somnolent but easily arose. We will continue to monitor. Jennifer Lawson Kitchen

## 2013-06-29 DIAGNOSIS — G92 Toxic encephalopathy: Secondary | ICD-10-CM | POA: Diagnosis not present

## 2013-06-29 DIAGNOSIS — N179 Acute kidney failure, unspecified: Secondary | ICD-10-CM | POA: Diagnosis not present

## 2013-06-29 DIAGNOSIS — N186 End stage renal disease: Secondary | ICD-10-CM

## 2013-06-29 DIAGNOSIS — N189 Chronic kidney disease, unspecified: Secondary | ICD-10-CM | POA: Diagnosis not present

## 2013-06-29 DIAGNOSIS — T50901A Poisoning by unspecified drugs, medicaments and biological substances, accidental (unintentional), initial encounter: Secondary | ICD-10-CM | POA: Diagnosis not present

## 2013-06-29 DIAGNOSIS — N170 Acute kidney failure with tubular necrosis: Secondary | ICD-10-CM | POA: Diagnosis not present

## 2013-06-29 DIAGNOSIS — G9340 Encephalopathy, unspecified: Secondary | ICD-10-CM | POA: Diagnosis not present

## 2013-06-29 DIAGNOSIS — D631 Anemia in chronic kidney disease: Secondary | ICD-10-CM | POA: Diagnosis not present

## 2013-06-29 DIAGNOSIS — J9600 Acute respiratory failure, unspecified whether with hypoxia or hypercapnia: Secondary | ICD-10-CM | POA: Diagnosis not present

## 2013-06-29 DIAGNOSIS — R404 Transient alteration of awareness: Secondary | ICD-10-CM | POA: Diagnosis not present

## 2013-06-29 LAB — BASIC METABOLIC PANEL
BUN: 88 mg/dL — ABNORMAL HIGH (ref 6–23)
CO2: 21 mEq/L (ref 19–32)
Calcium: 8.8 mg/dL (ref 8.4–10.5)
Chloride: 100 mEq/L (ref 96–112)
Creatinine, Ser: 7.28 mg/dL — ABNORMAL HIGH (ref 0.50–1.10)
GFR calc Af Amer: 7 mL/min — ABNORMAL LOW (ref >90)
GFR calc non Af Amer: 6 mL/min — ABNORMAL LOW (ref >90)
Glucose, Bld: 145 mg/dL — ABNORMAL HIGH (ref 70–99)
Potassium: 4.6 mEq/L (ref 3.7–5.3)
Sodium: 137 mEq/L (ref 137–147)

## 2013-06-29 LAB — GLUCOSE, CAPILLARY
Glucose-Capillary: 111 mg/dL — ABNORMAL HIGH (ref 70–99)
Glucose-Capillary: 125 mg/dL — ABNORMAL HIGH (ref 70–99)
Glucose-Capillary: 132 mg/dL — ABNORMAL HIGH (ref 70–99)
Glucose-Capillary: 132 mg/dL — ABNORMAL HIGH (ref 70–99)
Glucose-Capillary: 137 mg/dL — ABNORMAL HIGH (ref 70–99)
Glucose-Capillary: 99 mg/dL (ref 70–99)

## 2013-06-29 LAB — CBC
HCT: 24.9 % — ABNORMAL LOW (ref 36.0–46.0)
Hemoglobin: 7.9 g/dL — ABNORMAL LOW (ref 12.0–15.0)
MCH: 29.8 pg (ref 26.0–34.0)
MCHC: 31.7 g/dL (ref 30.0–36.0)
MCV: 94 fL (ref 78.0–100.0)
Platelets: 173 10*3/uL (ref 150–400)
RBC: 2.65 MIL/uL — ABNORMAL LOW (ref 3.87–5.11)
RDW: 17 % — ABNORMAL HIGH (ref 11.5–15.5)
WBC: 5.1 10*3/uL (ref 4.0–10.5)

## 2013-06-29 MED ORDER — INSULIN ASPART 100 UNIT/ML ~~LOC~~ SOLN
0.0000 [IU] | Freq: Three times a day (TID) | SUBCUTANEOUS | Status: DC
  Administered 2013-06-29: 1 [IU] via SUBCUTANEOUS

## 2013-06-29 MED ORDER — FUROSEMIDE 10 MG/ML IJ SOLN
40.0000 mg | Freq: Once | INTRAMUSCULAR | Status: AC
  Administered 2013-06-29: 40 mg via INTRAVENOUS
  Filled 2013-06-29: qty 4

## 2013-06-29 NOTE — Progress Notes (Signed)
Patient arrived onto unit in bed with nurse at bedside along with belongings. Patient oriented to room and call bell, and all questions answered.

## 2013-06-29 NOTE — Evaluation (Signed)
Physical Therapy Evaluation Patient Details Name: Jennifer Lawson MRN: [id number] DOB: August 10, 1960 Today's Date: 06/29/2013 Time: 7035-0093 PT Time Calculation (min): 19 min  PT Assessment / Plan / Recommendation History of Present Illness  54 yo female admitted with encephalopathy due to unintentional OD of psych meds with resultant acute resp failure. Hx of bipolar d/o, HTN, R TKA  Clinical Impression  Pt pleasant and states she can normally walk much farther and faster than currently. Pt reports she was getting sicker and weaker for several weeks prior to admission and wants to return to previous baseline. Pt was caring for 2yo grandson until her illness per pt. Pt with decreased cognition, mobility and balance and will benefit from acute therapy to maximize activity, mobility and function prior to discharge to increase independence.     PT Assessment  Patient needs continued PT services    Follow Up Recommendations  Home health PT;Supervision - Intermittent    Does the patient have the potential to tolerate intense rehabilitation      Barriers to Discharge Decreased caregiver support      Equipment Recommendations  None recommended by PT    Recommendations for Other Services     Frequency Min 3X/week    Precautions / Restrictions Precautions Precautions: Fall   Pertinent Vitals/Pain Pt reports discomfort with catheter HR 84 sats 92- 94% on RA      Mobility  Transfers Overall transfer level: Needs assistance Transfers: Sit to/from Stand Sit to Stand: Supervision General transfer comment: cues for hand placement and safety Ambulation/Gait Ambulation/Gait assistance: Supervision Ambulation Distance (Feet): 150 Feet Assistive device: Rolling walker (2 wheeled) Gait Pattern/deviations: Step-through pattern;Decreased stride length Gait velocity interpretation: Below normal speed for age/gender General Gait Details: cues to step into RW and for directional cues     Exercises     PT Diagnosis: Difficulty walking;Altered mental status  PT Problem List: Decreased activity tolerance;Decreased mobility;Decreased knowledge of use of DME;Decreased strength PT Treatment Interventions: Gait training;DME instruction;Stair training;Functional mobility training;Patient/family education;Therapeutic activities;Therapeutic exercise     PT Goals(Current goals can be found in the care plan section) Acute Rehab PT Goals Patient Stated Goal: be able to walk like I was PT Goal Formulation: With patient Time For Goal Achievement: 07/13/13 Potential to Achieve Goals: Good  Visit Information  Last PT Received On: 06/29/13 Assistance Needed: +1 History of Present Illness: 53 yo female admitted with encephalopathy due to unintentional OD of psych meds with resultant acute resp failure. Hx of bipolar d/o, HTN, R TKA       Prior Functioning  Home Living Family/patient expects to be discharged to:: Private residence Living Arrangements: Children Available Help at Discharge: Family;Available PRN/intermittently Type of Home: House Home Access: Stairs to enter Entrance Stairs-Number of Steps: 1 Home Layout: One level Home Equipment: Walker - 2 wheels Prior Function Level of Independence: Independent with assistive device(s);Needs assistance ADL's / Homemaking Assistance Needed: dgtr does housework and assist with meds, pt does her bathing and dressing on her own Communication Communication: No difficulties    Cognition  Cognition Arousal/Alertness: Awake/alert Behavior During Therapy: Flat affect Overall Cognitive Status: No family/caregiver present to determine baseline cognitive functioning Area of Impairment: Orientation Orientation Level: Time    Extremity/Trunk Assessment Upper Extremity Assessment Upper Extremity Assessment: Generalized weakness Lower Extremity Assessment Lower Extremity Assessment: Generalized weakness Cervical / Trunk  Assessment Cervical / Trunk Assessment: Normal   Balance    End of Session PT - End of Session Equipment Utilized During Treatment: Gait  belt Activity Tolerance: Patient tolerated treatment well Patient left: in chair;with call bell/phone within reach Nurse Communication: Mobility status  GP     Melford Aase 06/29/2013, 1:54 PM Elwyn Reach, [place]

## 2013-06-29 NOTE — Progress Notes (Signed)
Patient ID: RENLEE FLOOR, female   DOB: [birth year], 53 y.o.   MRN: [id number] S:More awake/alert, hungry O:BP 130/64  Pulse 83  Temp(Src) 98.2 F (36.8 C) (Oral)  Resp 20  Ht 5\' 2"  (1.575 m)  Wt 101.4 kg (223 lb 8.7 oz)  BMI 40.88 kg/m2  SpO2 91%  Intake/Output Summary (Last 24 hours) at 06/29/13 1303 Last data filed at 06/29/13 1200  Gross per 24 hour  Intake   2600 ml  Output    950 ml  Net   1650 ml   Intake/Output: I/O last 3 completed shifts: In: 2320 [P.O.:2320] Out: 1008 [Urine:1008]  Intake/Output this shift:  Total I/O In: 480 [P.O.:480] Out: 272 [Urine:272] Weight change: 1.6 kg (3 lb 8.4 oz) XNT:ZGYFV/CBSWH CVS:no rub Resp:cta QPR:FFMBWG Ext:no edema, RUE AVF +T/B   Recent Labs Lab 06/27/13 1737 06/28/13 0615 06/28/13 1405 06/29/13 0307  NA 136* 140 140 137  K 5.2 4.7 4.8 4.6  CL 98 101 102 100  CO2 20 23 22 21   GLUCOSE 89 100* 81 145*  BUN 85* 88* 90* 88*  CREATININE 7.11* 7.82* 7.81* 7.28*  ALBUMIN 3.1* 2.7*  --   --   CALCIUM 9.5 9.3 9.2 8.8  AST 41* 15  --   --   ALT 51* 38*  --   --    Liver Function Tests:  Recent Labs Lab 06/27/13 1737 06/28/13 0615  AST 41* 15  ALT 51* 38*  ALKPHOS 87 74  BILITOT <0.2* <0.2*  PROT 7.7 6.8  ALBUMIN 3.1* 2.7*   No results found for this basename: LIPASE, AMYLASE,  in the last 168 hours  Recent Labs Lab 06/27/13 2000  AMMONIA RESULTS UNAVAILABLE DUE TO INTERFERING SUBSTANCE   CBC:  Recent Labs Lab 06/27/13 1737 06/28/13 0615 06/29/13 0307  WBC 7.1 6.0 5.1  NEUTROABS  --  3.7  --   HGB 9.7* 8.3* 7.9*  HCT 30.6* 26.6* 24.9*  MCV 94.4 95.3 94.0  PLT 278 213 173   Cardiac Enzymes:  Recent Labs Lab 06/28/13 0615  CKTOTAL 34   CBG:  Recent Labs Lab 06/28/13 2006 06/29/13 0024 06/29/13 0509 06/29/13 0752 06/29/13 1205  GLUCAP 116* 125* 132* 132* 137*    Iron Studies: No results found for this basename: IRON, TIBC, TRANSFERRIN, FERRITIN,  in the last 72  hours Studies/Results: Dg Chest 1 View  06/27/2013   CLINICAL DATA:  Shortness of breath.  EXAM: CHEST - 1 VIEW  COMPARISON:  June 12, 2013.  FINDINGS: Mild cardiomegaly is noted. Mild central pulmonary vascular congestion is noted. No pneumothorax or pleural effusion is noted. Linear density is noted laterally in the left lung most consistent with subsegmental atelectasis. Bony thorax appears intact.  IMPRESSION: Focus of subsegmental atelectasis seen laterally in left lung. Mild cardiomegaly and central pulmonary vascular congestion is noted.   Electronically Signed   By: Sabino Dick M.D.   On: 06/27/2013 18:12   Ct Head Wo Contrast  06/27/2013   CLINICAL DATA:  Altered mental status  EXAM: CT HEAD WITHOUT CONTRAST  TECHNIQUE: Contiguous axial images were obtained from the base of the skull through the vertex without intravenous contrast.  COMPARISON:  MRI brain dated 01/19/2013  FINDINGS: No evidence of parenchymal hemorrhage or extra-axial fluid collection. No mass lesion, mass effect, or midline shift.  No CT evidence of acute infarction.  Subcortical white matter and periventricular small vessel ischemic changes.  Partial opacification of the left ethmoid and maxillary sinuses. Mastoid air  cells are clear.  No evidence of calvarial fracture.  IMPRESSION: No evidence of acute intracranial abnormality.   Electronically Signed   By: Julian Hy M.D.   On: 06/27/2013 18:27   Dg Chest Port 1 View  06/28/2013   CLINICAL DATA:  Congestion.  EXAM: PORTABLE CHEST - 1 VIEW  COMPARISON:  06/27/2013  FINDINGS: Cardiomegaly with vascular congestion. No overt edema. Low lung volumes without confluent opacity or effusion. No acute bony abnormality.  IMPRESSION: Cardiomegaly, vascular congestion.   Electronically Signed   By: Rolm Baptise M.D.   On: 06/28/2013 05:40   . arformoterol  15 mcg Nebulization Q12H  . budesonide (PULMICORT) nebulizer solution  0.5 mg Nebulization Q12H  . heparin subcutaneous   5,000 Units Subcutaneous 3 times per day  . insulin aspart  0-9 Units Subcutaneous TID WC  . sodium chloride  3 mL Intravenous Q12H  . sodium chloride  3 mL Intravenous Q12H    BMET    Component Value Date/Time   NA 137 06/29/2013 0307   K 4.6 06/29/2013 0307   CL 100 06/29/2013 0307   CO2 21 06/29/2013 0307   GLUCOSE 145* 06/29/2013 0307   BUN 88* 06/29/2013 0307   CREATININE 7.28* 06/29/2013 0307   CALCIUM 8.8 06/29/2013 0307   GFRNONAA 6* 06/29/2013 0307   GFRAA 7* 06/29/2013 0307   CBC    Component Value Date/Time   WBC 5.1 06/29/2013 0307   RBC 2.65* 06/29/2013 0307   HGB 7.9* 06/29/2013 0307   HCT 24.9* 06/29/2013 0307   PLT 173 06/29/2013 0307   MCV 94.0 06/29/2013 0307   MCH 29.8 06/29/2013 0307   MCHC 31.7 06/29/2013 0307   RDW 17.0* 06/29/2013 0307   LYMPHSABS 1.2 06/28/2013 0615   MONOABS 0.8 06/28/2013 0615   EOSABS 0.2 06/28/2013 0615   BASOSABS 0.0 06/28/2013 0615     Assessment/Plan:  1. AKI/CKD- likely ischemic ATN given hypotension and volume depletion.  1. Lasix held with imrpvoement of BUN/Cr 2. Cont with IVF's and follow UOP and daily Scr 2. AMS- due to overdose of psychotropic meds- believed to be unintentional. Cont to hold these meds 3. ABLA on ACDz- drop in Hgb post op. Follow and transfuse prn. May need to initiate EPO. Will also follow iron stores 4. Myoclonic jerking- related to gabapentin toxicity. Cont to hold 5. Vascular access- s/p RUE AVF +T/B, only 58 weeks old. May need PC if renal function does not return over the next 24-48 hours 6. Hyponatremia- improved with IVF's 7. hyeprkalemia- improved after IVF's 8. Elevated LFT's- follow 9.  [place] A

## 2013-06-29 NOTE — Progress Notes (Signed)
Name: Jennifer Lawson  MRN: [id number]  DOB: Jun 01, 1960   ADMISSION DATE: 06/27/2013  CONSULTATION DATE: 2/20   REFERRING MD : Hal Hope  PRIMARY SERVICE: [facility] and Nephrology consultant   CHIEF COMPLAINT: ams and hypercarbic resp failure   BRIEF PATIENT DESCRIPTION:  59 yof w/ ESRD (not yet on HD). Presented 2/19 for unintentional overdose of Geodon, gabapentin w/ resultant altered mental status and Hypercarbic resp failure.  >transfered to [facility] on 2/20 as scr had more than doubled from two weeks prior.   SIGNIFICANT EVENTS / STUDIES:  CT head 2/20: neg   LINES / TUBES:   CULTURES:   ANTIBIOTICS:   SUBJECTIVE:  2/ 20/15: Looks a little better w/ current mask   06/29/2013: Awake, Eating breakfast. Walked in room. RN feels patient can go to floor. Patient is hungry and wants to eat   VITAL SIGNS: Temp:  [98.1 F (36.7 C)-99.1 F (37.3 C)] 98.4 F (36.9 C) (02/21 0755) Pulse Rate:  [73-84] 81 (02/21 0900) Resp:  [14-22] 16 (02/21 0900) BP: (93-117)/(51-80) 108/61 mmHg (02/21 0900) SpO2:  [86 %-98 %] 90 % (02/21 0915) FiO2 (%):  [24 %] 24 % (02/20 1900) Weight:  [101.4 kg (223 lb 8.7 oz)] 101.4 kg (223 lb 8.7 oz) (02/21 0500) HEMODYNAMICS:   VENTILATOR SETTINGS: Vent Mode:  [-]  FiO2 (%):  [24 %] 24 % INTAKE / OUTPUT: Intake/Output     02/20 0701 - 02/21 0700 02/21 0701 - 02/22 0700   P.O. 2320 240   Total Intake(mL/kg) 2320 (22.9) 240 (2.4)   Urine (mL/kg/hr) 803 (0.3) 82 (0.3)   Total Output 803 82   Net +1517 +158          PHYSICAL EXAMINATION:  General: Looks deconditioned (per RN this might be baseline, sedentary, watches cartoon) Neuro: RASS o to -1. Sleeps easy but easily awake and conversant.  HEENT: edentulous  Cardiovascular: rrr III/VI SEM  Lungs: Scattered rhonchi, no accessory muscle use  Abdomen: Soft, + bowel sounds  Musculoskeletal: Intact  Skin: Intact    LABS:  CBC  Recent Labs Lab 06/27/13 1737 06/28/13 0615  06/29/13 0307  WBC 7.1 6.0 5.1  HGB 9.7* 8.3* 7.9*  HCT 30.6* 26.6* 24.9*  PLT 278 213 173   Coag's No results found for this basename: APTT, INR,  in the last 168 hours BMET  Recent Labs Lab 06/28/13 0615 06/28/13 1405 06/29/13 0307  NA 140 140 137  K 4.7 4.8 4.6  CL 101 102 100  CO2 23 22 21   BUN 88* 90* 88*  CREATININE 7.82* 7.81* 7.28*  GLUCOSE 100* 81 145*   Electrolytes  Recent Labs Lab 06/28/13 0615 06/28/13 1405 06/29/13 0307  CALCIUM 9.3 9.2 8.8   Sepsis Markers No results found for this basename: LATICACIDVEN, PROCALCITON, O2SATVEN,  in the last 168 hours ABG  Recent Labs Lab 06/27/13 2240 06/28/13 0400 06/28/13 0546  PHART 7.206* 7.183* 7.197*  PCO2ART 58.2* 64.1* 61.6*  PO2ART 175.0* 109.0* 117.0*   Liver Enzymes  Recent Labs Lab 06/27/13 1737 06/28/13 0615  AST 41* 15  ALT 51* 38*  ALKPHOS 87 74  BILITOT <0.2* <0.2*  ALBUMIN 3.1* 2.7*   Cardiac Enzymes No results found for this basename: TROPONINI, PROBNP,  in the last 168 hours Glucose  Recent Labs Lab 06/28/13 1119 06/28/13 1600 06/28/13 2006 06/29/13 0024 06/29/13 0509 06/29/13 0752  GLUCAP 78 97 116* 125* 132* 132*    Imaging Dg Chest 1 View  06/27/2013   CLINICAL DATA:  Shortness of breath.  EXAM: CHEST - 1 VIEW  COMPARISON:  June 12, 2013.  FINDINGS: Mild cardiomegaly is noted. Mild central pulmonary vascular congestion is noted. No pneumothorax or pleural effusion is noted. Linear density is noted laterally in the left lung most consistent with subsegmental atelectasis. Bony thorax appears intact.  IMPRESSION: Focus of subsegmental atelectasis seen laterally in left lung. Mild cardiomegaly and central pulmonary vascular congestion is noted.   Electronically Signed   By: Sabino Dick M.D.   On: 06/27/2013 18:12   Ct Head Wo Contrast  06/27/2013   CLINICAL DATA:  Altered mental status  EXAM: CT HEAD WITHOUT CONTRAST  TECHNIQUE: Contiguous axial images were obtained  from the base of the skull through the vertex without intravenous contrast.  COMPARISON:  MRI brain dated 01/19/2013  FINDINGS: No evidence of parenchymal hemorrhage or extra-axial fluid collection. No mass lesion, mass effect, or midline shift.  No CT evidence of acute infarction.  Subcortical white matter and periventricular small vessel ischemic changes.  Partial opacification of the left ethmoid and maxillary sinuses. Mastoid air cells are clear.  No evidence of calvarial fracture.  IMPRESSION: No evidence of acute intracranial abnormality.   Electronically Signed   By: Julian Hy M.D.   On: 06/27/2013 18:27   Dg Chest Port 1 View  06/28/2013   CLINICAL DATA:  Congestion.  EXAM: PORTABLE CHEST - 1 VIEW  COMPARISON:  06/27/2013  FINDINGS: Cardiomegaly with vascular congestion. No overt edema. Low lung volumes without confluent opacity or effusion. No acute bony abnormality.  IMPRESSION: Cardiomegaly, vascular congestion.   Electronically Signed   By: Rolm Baptise M.D.   On: 06/28/2013 05:40     CXR: CM, vasc congestion   ASSESSMENT / PLAN:  PULMONARY A: acute hypercarbic respiratory failure secondary to unintentional overdose, and likely further c/b uremia and element of volume overload.  Has h/o COPD. Do not think that this is an exacerbation  2/20: has not really improved initially w/ BIPAP. Have switched her to NIPPV w/ different mask. Seems like it has better seal, but gas exchange not improved.   06/29/13: Singificantly better  P:   O2 as needed Monitor clinically   CARDIOVASCULAR A: pulm edema  Hypotension  06/29/2013: MAP 73 and well  P:  Hold lopressors Give one dose lasix.   RENAL A:  Acute on chronic renal failure       borderline hyperkalemia       Inadequate compensation for resp acidosis P:   Renal consult is monitoring without HD If she is to require HD will need vasc cath as his graft is not mature ; HD decisions per renal  GASTROINTESTINAL A:  No  acute  P:   Stress dose PPI Renal diet  HEMATOLOGIC A:  Anemia of chronic disease.  Likely CKD related.  P:  [place] heparin  Trend CBC ; PRBC for hgb < 7gm%   INFECTIOUS A:  No acute  P:   Trend fever curve   ENDOCRINE A:  H/O DM  P:   Trend CBG  ssi  NEUROLOGIC A:  Acute encephalopathy  >likely multifactorial: (residual Geodon and neurontin effect) +/- hypercarbia and progressive uremia   06/29/2013: improved. stil some easy nodding off to sleep but this might be uremic baseline   P:   Cont supportive care Holding all sedating meds HD decisions per renal   TODAY'S SUMMARY: s  06/29/2013 : move to med surg under [place]  PT consult    Dr. Brand Males, M.D., [facility]C.P Pulmonary and Critical Care Medicine Staff Physician [facility] Pager: [phone], If no answer or between  15:00h - 7:00h: call [phone]  06/29/2013 9:57 AM

## 2013-06-30 DIAGNOSIS — N179 Acute kidney failure, unspecified: Secondary | ICD-10-CM | POA: Diagnosis not present

## 2013-06-30 DIAGNOSIS — D631 Anemia in chronic kidney disease: Secondary | ICD-10-CM | POA: Diagnosis not present

## 2013-06-30 DIAGNOSIS — F319 Bipolar disorder, unspecified: Secondary | ICD-10-CM

## 2013-06-30 DIAGNOSIS — N189 Chronic kidney disease, unspecified: Secondary | ICD-10-CM | POA: Diagnosis not present

## 2013-06-30 DIAGNOSIS — R404 Transient alteration of awareness: Secondary | ICD-10-CM | POA: Diagnosis not present

## 2013-06-30 DIAGNOSIS — G9340 Encephalopathy, unspecified: Secondary | ICD-10-CM | POA: Diagnosis not present

## 2013-06-30 DIAGNOSIS — J9600 Acute respiratory failure, unspecified whether with hypoxia or hypercapnia: Secondary | ICD-10-CM | POA: Diagnosis not present

## 2013-06-30 LAB — RENAL FUNCTION PANEL
Albumin: 2.5 g/dL — ABNORMAL LOW (ref 3.5–5.2)
BUN: 87 mg/dL — ABNORMAL HIGH (ref 6–23)
CO2: 24 mEq/L (ref 19–32)
Calcium: 9.4 mg/dL (ref 8.4–10.5)
Chloride: 99 mEq/L (ref 96–112)
Creatinine, Ser: 5.99 mg/dL — ABNORMAL HIGH (ref 0.50–1.10)
GFR calc Af Amer: 8 mL/min — ABNORMAL LOW (ref >90)
GFR calc non Af Amer: 7 mL/min — ABNORMAL LOW (ref >90)
Glucose, Bld: 104 mg/dL — ABNORMAL HIGH (ref 70–99)
Phosphorus: 5.2 mg/dL — ABNORMAL HIGH (ref 2.3–4.6)
Potassium: 4.3 mEq/L (ref 3.7–5.3)
Sodium: 139 mEq/L (ref 137–147)

## 2013-06-30 LAB — GLUCOSE, CAPILLARY
Glucose-Capillary: 92 mg/dL (ref 70–99)
Glucose-Capillary: 108 mg/dL — ABNORMAL HIGH (ref 70–99)
Glucose-Capillary: 89 mg/dL (ref 70–99)
Glucose-Capillary: 108 mg/dL — ABNORMAL HIGH (ref 70–99)

## 2013-06-30 MED ORDER — ONDANSETRON HCL 4 MG/2ML IJ SOLN
4.0000 mg | Freq: Three times a day (TID) | INTRAMUSCULAR | Status: DC | PRN
  Administered 2013-06-30: 4 mg via INTRAVENOUS
  Filled 2013-06-30: qty 2

## 2013-06-30 MED ORDER — ZIPRASIDONE HCL 60 MG PO CAPS
120.0000 mg | ORAL_CAPSULE | Freq: Every day | ORAL | Status: DC
  Administered 2013-07-01: 120 mg via ORAL
  Filled 2013-06-30 (×2): qty 2

## 2013-06-30 MED ORDER — DIVALPROEX SODIUM 500 MG PO DR TAB
1000.0000 mg | DELAYED_RELEASE_TABLET | Freq: Every day | ORAL | Status: DC
  Administered 2013-07-01: 1000 mg via ORAL
  Filled 2013-06-30 (×3): qty 2

## 2013-06-30 MED ORDER — GABAPENTIN 600 MG PO TABS
1800.0000 mg | ORAL_TABLET | Freq: Every day | ORAL | Status: DC
  Filled 2013-06-30 (×2): qty 3

## 2013-06-30 MED ORDER — METOPROLOL SUCCINATE ER 50 MG PO TB24
50.0000 mg | ORAL_TABLET | Freq: Every day | ORAL | Status: DC
  Administered 2013-06-30 – 2013-07-01 (×2): 50 mg via ORAL
  Filled 2013-06-30 (×2): qty 1

## 2013-06-30 MED ORDER — GABAPENTIN 600 MG PO TABS
300.0000 mg | ORAL_TABLET | Freq: Every day | ORAL | Status: DC
  Administered 2013-07-01: 300 mg via ORAL
  Filled 2013-06-30: qty 0.5

## 2013-06-30 NOTE — Progress Notes (Signed)
Patient ID: IMANNI BURDINE, female   DOB: 03-Dec-1960, 53 y.o.   MRN: [id number] S:feels better and wants to go home O:BP 135/83  Pulse 90  Temp(Src) 98.2 F (36.8 C) (Oral)  Resp 18  Ht 5\' 2"  (1.575 m)  Wt 102.74 kg (226 lb 8 oz)  BMI 41.42 kg/m2  SpO2 90%  Intake/Output Summary (Last 24 hours) at 06/30/13 1117 Last data filed at 06/30/13 0742  Gross per 24 hour  Intake    940 ml  Output   1903 ml  Net   -963 ml   Intake/Output: I/O last 3 completed shifts: In: 2740 [P.O.:2740] Out: 2305 [Urine:2305]  Intake/Output this shift:  Total I/O In: -  Out: 300 [Urine:300] Weight change: 1.34 kg (2 lb 15.3 oz) ERD:EYCXKGY comfortably CVS:no rub Resp:cta JEH:UDJSHF Ext:+edema, RUE AVF +T/B   Recent Labs Lab 06/27/13 1737 06/28/13 0615 06/28/13 1405 06/29/13 0307 06/30/13 0855  NA 136* 140 140 137 139  K 5.2 4.7 4.8 4.6 4.3  CL 98 101 102 100 99  CO2 20 23 22 21 24   GLUCOSE 89 100* 81 145* 104*  BUN 85* 88* 90* 88* 87*  CREATININE 7.11* 7.82* 7.81* 7.28* 5.99*  ALBUMIN 3.1* 2.7*  --   --  2.5*  CALCIUM 9.5 9.3 9.2 8.8 9.4  PHOS  --   --   --   --  5.2*  AST 41* 15  --   --   --   ALT 51* 38*  --   --   --    Liver Function Tests:  Recent Labs Lab 06/27/13 1737 06/28/13 0615 06/30/13 0855  AST 41* 15  --   ALT 51* 38*  --   ALKPHOS 87 74  --   BILITOT <0.2* <0.2*  --   PROT 7.7 6.8  --   ALBUMIN 3.1* 2.7* 2.5*   No results found for this basename: LIPASE, AMYLASE,  in the last 168 hours  Recent Labs Lab 06/27/13 2000  AMMONIA RESULTS UNAVAILABLE DUE TO INTERFERING SUBSTANCE   CBC:  Recent Labs Lab 06/27/13 1737 06/28/13 0615 06/29/13 0307  WBC 7.1 6.0 5.1  NEUTROABS  --  3.7  --   HGB 9.7* 8.3* 7.9*  HCT 30.6* 26.6* 24.9*  MCV 94.4 95.3 94.0  PLT 278 213 173   Cardiac Enzymes:  Recent Labs Lab 06/28/13 0615  CKTOTAL 34   CBG:  Recent Labs Lab 06/29/13 0752 06/29/13 1205 06/29/13 1624 06/29/13 2120 06/30/13 0737  GLUCAP  132* 137* 111* 99 92    Iron Studies: No results found for this basename: IRON, TIBC, TRANSFERRIN, FERRITIN,  in the last 72 hours Studies/Results: No results found. Marland Kitchen arformoterol  15 mcg Nebulization Q12H  . budesonide (PULMICORT) nebulizer solution  0.5 mg Nebulization Q12H  . heparin subcutaneous  5,000 Units Subcutaneous 3 times per day  . insulin aspart  0-9 Units Subcutaneous TID WC  . sodium chloride  3 mL Intravenous Q12H  . sodium chloride  3 mL Intravenous Q12H    BMET    Component Value Date/Time   NA 139 06/30/2013 0855   K 4.3 06/30/2013 0855   CL 99 06/30/2013 0855   CO2 24 06/30/2013 0855   GLUCOSE 104* 06/30/2013 0855   BUN 87* 06/30/2013 0855   CREATININE 5.99* 06/30/2013 0855   CALCIUM 9.4 06/30/2013 0855   GFRNONAA 7* 06/30/2013 0855   GFRAA 8* 06/30/2013 0855   CBC    Component Value Date/Time  WBC 5.1 06/29/2013 0307   RBC 2.65* 06/29/2013 0307   HGB 7.9* 06/29/2013 0307   HCT 24.9* 06/29/2013 0307   PLT 173 06/29/2013 0307   MCV 94.0 06/29/2013 0307   MCH 29.8 06/29/2013 0307   MCHC 31.7 06/29/2013 0307   RDW 17.0* 06/29/2013 0307   LYMPHSABS 1.2 06/28/2013 0615   MONOABS 0.8 06/28/2013 0615   EOSABS 0.2 06/28/2013 0615   BASOSABS 0.0 06/28/2013 0615     Assessment/Plan:  1. AKI/CKD stage 4- likely ischemic ATN given hypotension and volume depletion.  1. Lasix held with imrpvoement of BUN/Cr 2. Cont with IVF's and follow UOP and daily Scr 3. Marked improvement overnight. 4. Cont to follow over the next 24 hours off of IVF's 5. No indication for HD 6. When stable to have her f/u with Dr. Florene Glen as an outpt (will need to cont to monitor labs after discharge) 2. AMS- due to overdose of psychotropic meds- believed to be unintentional. Cont to hold these meds 3. ABLA on ACDz- drop in Hgb post op. Follow and transfuse prn. May need to initiate EPO. Will also follow iron stores 4. Myoclonic jerking- related to gabapentin toxicity. Cont to hold 5. Vascular access-  s/p RUE AVF +T/B, only 74 weeks old.  6. Hyponatremia- improved with IVF's 7. hyeprkalemia- improved after IVF's 8. Elevated LFT's- follow 9. Dispo- will need to see continued improvement in BUN/Cr off of IVF's and if she is deemed competent to care for herself and not in danger of overdosing on medicines again.  Cass A

## 2013-06-30 NOTE — Progress Notes (Signed)
[facility] PROGRESS NOTE Interim History: 53 y.o. female with history of end-stage renal disease presently not on dialysis and has had recent right upper extremity graft, COPD, bipolar disorder was brought to the ER after patient was found to be increasingly lethargic. As per patient's daughter patient had taken 2 more of her Neurontin somewhere around midnight as she was not feeling sleepy. Patient confirms that she has not taken any other medications. In the ER patient was somnolent but easily arousable and follows commands. CTA did not show anything acute. Salicylate and Tylenol levels were undetectable. Left he was normal. Depakote level was low. Ammonia is pending. ABGs pending. Patient's creatinine was found to have doubled with the last 2 weeks.  Assessment/Plan: Encephalopathy acute/  Acute respiratory failure: - Secondary to unintentional overdose, and likely further worsen due to uremia and element of volume overload.  - Now improved. - Home in am.  Renal failure (ARF), Acute on chronic: - Likely due to ATN in the setting of hypotension and vol. Depletion. - Cr has improved. - b-met in am. Hopefully home in am. - baseline cr around 3.1.  Bipolar disorder - resume Depakote and Geodon.    Code Status: Full code.  Family Communication: Patient's daughter.  Disposition Plan: Admit to inpatient.     Consultants:  renal  Procedures:  CT head  CXR  Antibiotics:  None  HPI/Subjective: No complains feels better  Objective: Filed Vitals:   06/30/13 0455 06/30/13 0741 06/30/13 0920 06/30/13 1159  BP: 141/93 135/83  125/84  Pulse: 99 90  91  Temp: 97.8 F (36.6 C) 98.2 F (36.8 C)  98.3 F (36.8 C)  TempSrc: Oral     Resp: 18 18  18   Height:      Weight:      SpO2: 91% 92% 90% 91%    Intake/Output Summary (Last 24 hours) at 06/30/13 1221 Last data filed at 06/30/13 1100  Gross per 24 hour  Intake    820 ml  Output   2083 ml  Net  -1263 ml    Filed Weights   06/28/13 0408 06/29/13 0500 06/29/13 2124  Weight: 99.8 kg (220 lb 0.3 oz) 101.4 kg (223 lb 8.7 oz) 102.74 kg (226 lb 8 oz)    Exam:  General: Alert, awake, oriented x3, in no acute distress.  HEENT: No bruits, no goiter.  Heart: Regular rate and rhythm, without murmurs, rubs, gallops.  Lungs: Good air movement, clear to auscultation.  Data Reviewed: Basic Metabolic Panel:  Recent Labs Lab 06/27/13 1737 06/28/13 0615 06/28/13 1405 06/29/13 0307 06/30/13 0855  NA 136* 140 140 137 139  K 5.2 4.7 4.8 4.6 4.3  CL 98 101 102 100 99  CO2 20 23 22 21 24   GLUCOSE 89 100* 81 145* 104*  BUN 85* 88* 90* 88* 87*  CREATININE 7.11* 7.82* 7.81* 7.28* 5.99*  CALCIUM 9.5 9.3 9.2 8.8 9.4  PHOS  --   --   --   --  5.2*   Liver Function Tests:  Recent Labs Lab 06/27/13 1737 06/28/13 0615 06/30/13 0855  AST 41* 15  --   ALT 51* 38*  --   ALKPHOS 87 74  --   BILITOT <0.2* <0.2*  --   PROT 7.7 6.8  --   ALBUMIN 3.1* 2.7* 2.5*   No results found for this basename: LIPASE, AMYLASE,  in the last 168 hours  Recent Labs Lab 06/27/13 2000  AMMONIA RESULTS UNAVAILABLE DUE TO  INTERFERING SUBSTANCE   CBC:  Recent Labs Lab 06/27/13 1737 06/28/13 0615 06/29/13 0307  WBC 7.1 6.0 5.1  NEUTROABS  --  3.7  --   HGB 9.7* 8.3* 7.9*  HCT 30.6* 26.6* 24.9*  MCV 94.4 95.3 94.0  PLT 278 213 173   Cardiac Enzymes:  Recent Labs Lab 06/28/13 0615  CKTOTAL 34   BNP (last 3 results)  Recent Labs  11/17/12 0933 01/17/13 04/23/13 1445  PROBNP 643.4* 6078.0* 7094.0*   CBG:  Recent Labs Lab 06/29/13 1205 06/29/13 1624 06/29/13 2120 06/30/13 0737 06/30/13 1110  GLUCAP 137* 111* 99 92 108*    Recent Results (from the past 240 hour(s))  MRSA PCR SCREENING     Status: None   Collection Time    06/28/13  1:41 AM      Result Value Ref Range Status   MRSA by PCR NEGATIVE  NEGATIVE Final   Comment:            The GeneXpert MRSA Assay (FDA     approved for  NASAL specimens     only), is one component of a     comprehensive MRSA colonization     surveillance program. It is not     intended to diagnose MRSA     infection nor to guide or     monitor treatment for     MRSA infections.     Studies: No results found.  Scheduled Meds: . arformoterol  15 mcg Nebulization Q12H  . budesonide (PULMICORT) nebulizer solution  0.5 mg Nebulization Q12H  . heparin subcutaneous  5,000 Units Subcutaneous 3 times per day  . insulin aspart  0-9 Units Subcutaneous TID WC  . sodium chloride  3 mL Intravenous Q12H  . sodium chloride  3 mL Intravenous Q12H   Continuous Infusions:    Charlynne Cousins  [facility] Pager [phone].  If 8PM-8AM, please contact night-coverage at [email or web address], password [facility] 06/30/2013, 12:21 PM  LOS: 3 days

## 2013-07-01 DIAGNOSIS — F319 Bipolar disorder, unspecified: Secondary | ICD-10-CM | POA: Diagnosis not present

## 2013-07-01 DIAGNOSIS — N189 Chronic kidney disease, unspecified: Secondary | ICD-10-CM | POA: Diagnosis not present

## 2013-07-01 DIAGNOSIS — D631 Anemia in chronic kidney disease: Secondary | ICD-10-CM | POA: Diagnosis not present

## 2013-07-01 DIAGNOSIS — N179 Acute kidney failure, unspecified: Secondary | ICD-10-CM | POA: Diagnosis not present

## 2013-07-01 DIAGNOSIS — G9340 Encephalopathy, unspecified: Secondary | ICD-10-CM | POA: Diagnosis not present

## 2013-07-01 DIAGNOSIS — J9600 Acute respiratory failure, unspecified whether with hypoxia or hypercapnia: Secondary | ICD-10-CM | POA: Diagnosis not present

## 2013-07-01 DIAGNOSIS — R404 Transient alteration of awareness: Secondary | ICD-10-CM | POA: Diagnosis not present

## 2013-07-01 LAB — RENAL FUNCTION PANEL
Albumin: 2.2 g/dL — ABNORMAL LOW (ref 3.5–5.2)
BUN: 80 mg/dL — ABNORMAL HIGH (ref 6–23)
CO2: 24 meq/L (ref 19–32)
Calcium: 9.4 mg/dL (ref 8.4–10.5)
Chloride: 107 meq/L (ref 96–112)
Creatinine, Ser: 4.86 mg/dL — ABNORMAL HIGH (ref 0.50–1.10)
GFR calc Af Amer: 11 mL/min — ABNORMAL LOW
GFR calc non Af Amer: 9 mL/min — ABNORMAL LOW
Glucose, Bld: 82 mg/dL (ref 70–99)
Phosphorus: 4.4 mg/dL (ref 2.3–4.6)
Potassium: 4.9 meq/L (ref 3.7–5.3)
Sodium: 143 meq/L (ref 137–147)

## 2013-07-01 LAB — GLUCOSE, CAPILLARY
Glucose-Capillary: 82 mg/dL (ref 70–99)
Glucose-Capillary: 117 mg/dL — ABNORMAL HIGH (ref 70–99)

## 2013-07-01 NOTE — Progress Notes (Signed)
   CARE MANAGEMENT NOTE 07/01/2013  Patient:  Jennifer Lawson, Jennifer Lawson   Account Number:  [id number]  Date Initiated:  06/28/2013  Documentation initiated by:  [facility]  Subjective/Objective Assessment:   resp failure requiring high oxygen needs.     Action/Plan:   Home Health PT   Anticipated DC Date:  07/01/2013   Anticipated DC Plan:  [place]  CM consult      [facility] Choice  HOME HEALTH   Choice offered to / List presented to:  C-1 Patient        [place] arranged  [place] [facility]      [place].   Status of service:  Completed, signed off Medicare Important Message given?   (If response is "NO", the following Medicare IM given date fields will be blank) Date Medicare IM given:   Date Additional Medicare IM given:    Discharge Disposition:  [place]  Per UR Regulation:  Reviewed for med. necessity/level of care/duration of stay  If discussed at [place] of Stay Meetings, dates discussed:    Comments:  ContactLesa, Jennifer Lawson Daughter [phone]                Jennifer Lawson [phone]   07/01/2013  [place], [place] [phone] CM referral: home health PT  Met with patient regarding choice of home health agency for PT. She selected [place].  [facility]/Marie called with referral.

## 2013-07-01 NOTE — Progress Notes (Signed)
[facility]  Patient Status: Active (receiving services up to time of hospitalization)  [facility] is providing the following services: RN, PT and OT  If patient discharges after hours, please call [phone].   Edy Mcbane 07/01/2013, 2:56 PM

## 2013-07-01 NOTE — Progress Notes (Signed)
Patient discharge teaching given, including activity, diet, follow-up appoints, and medications. Patient verbalized understanding of all discharge instructions. IV access was d/c'd. Vitals are stable. Skin is intact except as charted in most recent assessments. Pt to be escorted out by NT, to be driven home by family.  Thurman Sarver, MBA, BS, RN 

## 2013-07-01 NOTE — Discharge Summary (Signed)
Physician Discharge Summary  Jennifer Lawson [id number] DOB: 04-13-1961 DOA: 06/27/2013  PCP: Benito Mccreedy, MD  Admit date: 06/27/2013 Discharge date: 07/01/2013  Time spent: 35 minutes  Recommendations for Outpatient Follow-up:  1. Follow up with PCP in 2-4 weeks.  Discharge Diagnoses:  Principal Problem:   Acute encephalopathy Active Problems:   Drug overdose   Bipolar disorder   ARF (acute renal failure)   Acute respiratory failure   Renal failure (ARF), acute on chronic   Encephalopathy acute   Discharge Condition: stable  Diet recommendation: Heart healthy  Filed Weights   06/29/13 0500 06/29/13 2124 06/30/13 2053  Weight: 101.4 kg (223 lb 8.7 oz) 102.74 kg (226 lb 8 oz) 102.3 kg (225 lb 8.5 oz)    History of present illness:  53 y.o. female with history of end-stage renal disease presently not on dialysis and has had recent right upper extremity graft, COPD, bipolar disorder was brought to the ER after patient was found to be increasingly lethargic. As per patient's daughter patient had taken 2 more of her Neurontin somewhere around midnight as she was not feeling sleepy. Patient confirms that she has not taken any other medications. In the ER patient was somnolent but easily arousable and follows commands. CTA did not show anything acute. Salicylate and Tylenol levels were undetectable. Left he was normal. Depakote level was low. Ammonia is pending. ABGs pending. Patient's creatinine was found to have doubled with the last 2 weeks. Patient has chronic shortness of breath but otherwise denies any chest pain nausea vomiting abdominal pain diarrhea. Patient daughter states that patient has not been eating well recently and has been having increasing jerking of the extremities. Patient denies any suicidal ideation   Hospital Course:  Encephalopathy acute/ Acute respiratory failure:  - Secondary to unintentional overdose, and likely further worsen due to uremia and  element of volume overload.  - Now improved.   Renal failure (ARF), Acute on chronic:  - Likely due to ATN in the setting of hypotension and vol. Depletion. Consult renal recommended conservative management. - Cr has improved.  - baseline cr around 3.1.   Bipolar disorder  - resume Depakote and Geodon.    Procedures:  CT head  CXR  Consultations:  Renal  Discharge Exam: Filed Vitals:   07/01/13 0550  BP: 117/67  Pulse: 89  Temp: 98.6 F (37 C)  Resp: 18    General: A&O x3 Cardiovascular: RRR Respiratory: good air movement CTA B/L  Discharge Instructions      Discharge Orders   Future Appointments Provider Department Dept Phone   07/30/2013 1:00 PM [facility] [phone]   07/30/2013 2:15 PM Mal [name], MD [facility] [phone]   Future Orders Complete By Expires   Diet - low sodium heart healthy  As directed    Increase activity slowly  As directed        Medication List         albuterol (5 MG/ML) 0.5% nebulizer solution  Commonly known as:  PROVENTIL  Take 2.5 mg by nebulization every 4 (four) hours as needed for wheezing or shortness of breath.     albuterol 108 (90 BASE) MCG/ACT inhaler  Commonly known as:  PROVENTIL HFA;VENTOLIN HFA  Inhale 2 puffs into the lungs every 6 (six) hours as needed for wheezing or shortness of breath.     CENTRUM SILVER PO  Take 1 tablet by mouth daily.  divalproex 500 MG DR tablet  Commonly known as:  DEPAKOTE  Take 1,000 mg by mouth at bedtime.     fluticasone 50 MCG/ACT nasal spray  Commonly known as:  FLONASE  Place 2 sprays into both nostrils daily.     Fluticasone-Salmeterol 100-50 MCG/DOSE Aepb  Commonly known as:  ADVAIR  Inhale 1 puff into the lungs 2 (two) times daily.     furosemide 80 MG tablet  Commonly known as:  LASIX  Take 120 mg by mouth 2 (two) times daily. Take 1 1/2 tablet by mouth two times daily.      gabapentin 600 MG tablet  Commonly known as:  NEURONTIN  Take 1,800 mg by mouth at bedtime.     hydrOXYzine 50 MG tablet  Commonly known as:  ATARAX/VISTARIL  Take 50 mg by mouth at bedtime.     metoprolol succinate 100 MG 24 hr tablet  Commonly known as:  TOPROL-XL  Take 100 mg by mouth daily. Take with or immediately following a meal.     ziprasidone 60 MG capsule  Commonly known as:  GEODON  Take 120 mg by mouth at bedtime.       Allergies  Allergen Reactions  . Codeine Sulfate Anaphylaxis    Daughter called about having this allergy   . Haldol [Haloperidol Lactate] Shortness Of Breath  . Risperidone And Related Shortness Of Breath   Follow-up Information   Follow up with OSEI-BONSU,GEORGE, MD In 3 weeks. (hospital follow up)    Specialty:  Internal Medicine   Contact information:   [address] [place] [address] [phone]        The results of significant diagnostics from this hospitalization (including imaging, microbiology, ancillary and laboratory) are listed below for reference.    Significant Diagnostic Studies: Dg Chest 1 View  06/27/2013   CLINICAL DATA:  Shortness of breath.  EXAM: CHEST - 1 VIEW  COMPARISON:  June 12, 2013.  FINDINGS: Mild cardiomegaly is noted. Mild central pulmonary vascular congestion is noted. No pneumothorax or pleural effusion is noted. Linear density is noted laterally in the left lung most consistent with subsegmental atelectasis. Bony thorax appears intact.  IMPRESSION: Focus of subsegmental atelectasis seen laterally in left lung. Mild cardiomegaly and central pulmonary vascular congestion is noted.   Electronically Signed   By: Sabino Dick M.D.   On: 06/27/2013 18:12   Dg Chest 2 View  06/12/2013   CLINICAL DATA:  Right upper extremity AV fistula. Shortness of breath  EXAM: CHEST  2 VIEW  COMPARISON:  DG CHEST 2 VIEW dated 04/23/2013  FINDINGS: The heart size and mediastinal contours are within normal  limits. Both lungs are clear. The visualized skeletal structures are unremarkable.  IMPRESSION: No active cardiopulmonary disease.   Electronically Signed   By: Kathreen Devoid   On: 06/12/2013 10:12   Ct Head Wo Contrast  06/27/2013   CLINICAL DATA:  Altered mental status  EXAM: CT HEAD WITHOUT CONTRAST  TECHNIQUE: Contiguous axial images were obtained from the base of the skull through the vertex without intravenous contrast.  COMPARISON:  MRI brain dated 01/19/2013  FINDINGS: No evidence of parenchymal hemorrhage or extra-axial fluid collection. No mass lesion, mass effect, or midline shift.  No CT evidence of acute infarction.  Subcortical white matter and periventricular small vessel ischemic changes.  Partial opacification of the left ethmoid and maxillary sinuses. Mastoid air cells are clear.  No evidence of calvarial fracture.  IMPRESSION: No evidence  of acute intracranial abnormality.   Electronically Signed   By: Julian Hy M.D.   On: 06/27/2013 18:27   Dg Chest Port 1 View  06/28/2013   CLINICAL DATA:  Congestion.  EXAM: PORTABLE CHEST - 1 VIEW  COMPARISON:  06/27/2013  FINDINGS: Cardiomegaly with vascular congestion. No overt edema. Low lung volumes without confluent opacity or effusion. No acute bony abnormality.  IMPRESSION: Cardiomegaly, vascular congestion.   Electronically Signed   By: Rolm Baptise M.D.   On: 06/28/2013 05:40   Dg Knee Complete 4 Views Left  06/13/2013   CLINICAL DATA:  Pain in the knee.  EXAM: LEFT KNEE - COMPLETE 4+ VIEW  COMPARISON:  None.  FINDINGS: There is no evidence of fracture, dislocation. There is a small suprapatellar effusion. There are degenerative joint changes of the left knee with osteophyte formation. Soft tissues are unremarkable.  IMPRESSION: No acute fracture or dislocation.   Electronically Signed   By: Abelardo Diesel M.D.   On: 06/13/2013 15:16    Microbiology: Recent Results (from the past 240 hour(s))  MRSA PCR SCREENING     Status: None    Collection Time    06/28/13  1:41 AM      Result Value Ref Range Status   MRSA by PCR NEGATIVE  NEGATIVE Final   Comment:            The GeneXpert MRSA Assay (FDA     approved for NASAL specimens     only), is one component of a     comprehensive MRSA colonization     surveillance program. It is not     intended to diagnose MRSA     infection nor to guide or     monitor treatment for     MRSA infections.     Labs: Basic Metabolic Panel:  Recent Labs Lab 06/28/13 0615 06/28/13 1405 06/29/13 0307 06/30/13 0855 07/01/13 0637  NA 140 140 137 139 143  K 4.7 4.8 4.6 4.3 4.9  CL 101 102 100 99 107  CO2 23 22 21 24 24   GLUCOSE 100* 81 145* 104* 82  BUN 88* 90* 88* 87* 80*  CREATININE 7.82* 7.81* 7.28* 5.99* 4.86*  CALCIUM 9.3 9.2 8.8 9.4 9.4  PHOS  --   --   --  5.2* 4.4   Liver Function Tests:  Recent Labs Lab 06/27/13 1737 06/28/13 0615 06/30/13 0855 07/01/13 0637  AST 41* 15  --   --   ALT 51* 38*  --   --   ALKPHOS 87 74  --   --   BILITOT <0.2* <0.2*  --   --   PROT 7.7 6.8  --   --   ALBUMIN 3.1* 2.7* 2.5* 2.2*   No results found for this basename: LIPASE, AMYLASE,  in the last 168 hours  Recent Labs Lab 06/27/13 2000  AMMONIA RESULTS UNAVAILABLE DUE TO INTERFERING SUBSTANCE   CBC:  Recent Labs Lab 06/27/13 1737 06/28/13 0615 06/29/13 0307  WBC 7.1 6.0 5.1  NEUTROABS  --  3.7  --   HGB 9.7* 8.3* 7.9*  HCT 30.6* 26.6* 24.9*  MCV 94.4 95.3 94.0  PLT 278 213 173   Cardiac Enzymes:  Recent Labs Lab 06/28/13 0615  CKTOTAL 34   BNP: BNP (last 3 results)  Recent Labs  11/17/12 0933 01/17/13 04/23/13 1445  PROBNP 643.4* 6078.0* 7094.0*   CBG:  Recent Labs Lab 06/30/13 0737 06/30/13 1110 06/30/13 1624 06/30/13 2049 07/01/13 3825  GLUCAP 92 108* 89 108* 82       Signed:  FELIZ ORTIZ, ABRAHAM  [facility] 07/01/2013, 10:15 AM

## 2013-07-01 NOTE — Progress Notes (Signed)
[facility] ROUNDING NOTE   Subjective:   Interval History: stable feels nauseated this PM  Objective:  Vital signs in last 24 hours:  Temp:  [98.2 F (36.8 C)-98.8 F (37.1 C)] 98.2 F (36.8 C) (02/23 1000) Pulse Rate:  [82-89] 88 (02/23 1000) Resp:  [17-18] 18 (02/23 1000) BP: (111-126)/(67-78) 111/73 mmHg (02/23 1000) SpO2:  [86 %-98 %] 96 % (02/23 1000) Weight:  [102.3 kg (225 lb 8.5 oz)] 102.3 kg (225 lb 8.5 oz) (02/22 2053)  Weight change: -0.44 kg (-15.5 oz) Filed Weights   06/29/13 0500 06/29/13 2124 06/30/13 2053  Weight: 101.4 kg (223 lb 8.7 oz) 102.74 kg (226 lb 8 oz) 102.3 kg (225 lb 8.5 oz)    Intake/Output: I/O last 3 completed shifts: In: 1500 [P.O.:1500] Out: 2650 [Urine:2650]   Intake/Output this shift:  Total I/O In: 240 [P.O.:240] Out: 675 [Urine:675]  Obese  CVS- RRR RS- CTA ABD- BS present soft non-distended EXT- trace  edema   Basic Metabolic Panel:  Recent Labs Lab 06/28/13 0615 06/28/13 1405 06/29/13 0307 06/30/13 0855 07/01/13 0637  NA 140 140 137 139 143  K 4.7 4.8 4.6 4.3 4.9  CL 101 102 100 99 107  CO2 23 22 21 24 24   GLUCOSE 100* 81 145* 104* 82  BUN 88* 90* 88* 87* 80*  CREATININE 7.82* 7.81* 7.28* 5.99* 4.86*  CALCIUM 9.3 9.2 8.8 9.4 9.4  PHOS  --   --   --  5.2* 4.4    Liver Function Tests:  Recent Labs Lab 06/27/13 1737 06/28/13 0615 06/30/13 0855 07/01/13 0637  AST 41* 15  --   --   ALT 51* 38*  --   --   ALKPHOS 87 74  --   --   BILITOT <0.2* <0.2*  --   --   PROT 7.7 6.8  --   --   ALBUMIN 3.1* 2.7* 2.5* 2.2*   No results found for this basename: LIPASE, AMYLASE,  in the last 168 hours  Recent Labs Lab 06/27/13 2000  AMMONIA RESULTS UNAVAILABLE DUE TO INTERFERING SUBSTANCE    CBC:  Recent Labs Lab 06/27/13 1737 06/28/13 0615 06/29/13 0307  WBC 7.1 6.0 5.1  NEUTROABS  --  3.7  --   HGB 9.7* 8.3* 7.9*  HCT 30.6* 26.6* 24.9*  MCV 94.4 95.3 94.0  PLT 278 213 173     Cardiac Enzymes:  Recent Labs Lab 06/28/13 0615  CKTOTAL 34    BNP: No components found with this basename: POCBNP,   CBG:  Recent Labs Lab 06/30/13 1110 06/30/13 1624 06/30/13 2049 07/01/13 0808 07/01/13 1115  GLUCAP 108* 89 108* 64 117*    Microbiology: Results for orders placed during the hospital encounter of 06/27/13  MRSA PCR SCREENING     Status: None   Collection Time    06/28/13  1:41 AM      Result Value Ref Range Status   MRSA by PCR NEGATIVE  NEGATIVE Final   Comment:            The GeneXpert MRSA Assay (FDA     approved for NASAL specimens     only), is one component of a     comprehensive MRSA colonization     surveillance program. It is not     intended to diagnose MRSA     infection nor to guide or     monitor treatment for     MRSA infections.    Coagulation Studies:  No results found for this basename: LABPROT, INR,  in the last 72 hours  Urinalysis: No results found for this basename: COLORURINE, APPERANCEUR, LABSPEC, PHURINE, GLUCOSEU, HGBUR, BILIRUBINUR, KETONESUR, PROTEINUR, UROBILINOGEN, NITRITE, LEUKOCYTESUR,  in the last 72 hours    Imaging: No results found.   Medications:     . arformoterol  15 mcg Nebulization Q12H  . budesonide (PULMICORT) nebulizer solution  0.5 mg Nebulization Q12H  . divalproex  1,000 mg Oral QHS  . gabapentin  300 mg Oral QHS  . heparin subcutaneous  5,000 Units Subcutaneous 3 times per day  . insulin aspart  0-9 Units Subcutaneous TID WC  . metoprolol succinate  50 mg Oral Daily  . sodium chloride  3 mL Intravenous Q12H  . sodium chloride  3 mL Intravenous Q12H  . ziprasidone  120 mg Oral QHS   albuterol, ondansetron  Assessment/ Plan:    53 y.o. female with history of end-stage renal disease presently not on dialysis and has had recent right upper extremity graft, COPD, bipolar disorder was brought to the ER after patient was found to be increasingly lethargic. As per patient's daughter  patient had taken 2 more of her Neurontin somewhere around midnight as she was not feeling sleepy. Patient confirms that she has not taken any other medications. In the ER patient was somnolent but easily arousable and follows commands. CTA did not show anything acute. Salicylate and Tylenol levels were undetectable  1. Acute renal failure on CKD transplant  Creatinine is now declining  2. S/P renal transplant followed by Dr Florene Glen  Baseline creatinine 3.1  3. Encephalopathy drug induced improved  Will sign off follow up labs 1 week and appointment Dr Florene Glen    LOS: 4 Jahnyla Parrillo W @TODAY @12 :30 PM

## 2013-07-01 NOTE — Progress Notes (Signed)
Physical Therapy Treatment Patient Details Name: Jennifer Lawson MRN: [id number] DOB: 01-09-1961 Today's Date: 07/01/2013 Time: 5809-9833 PT Time Calculation (min): 25 min  PT Assessment / Plan / Recommendation  History of Present Illness 53 yo female admitted with encephalopathy due to unintentional OD of psych meds with resultant acute resp failure. Hx of bipolar d/o, HTN, R TKA   PT Comments   Patient continues to c/o weakness, but feel she will be safe with family assist and HHPT.  Educated her on gradual return to activities at home and importance of balance between rest and activity as she recovers from this illness.  Also answered questions she had regarding dialysis.    Follow Up Recommendations  Home health PT;Supervision - Intermittent     Does the patient have the potential to tolerate intense rehabilitation   N/A  Barriers to Discharge  None      Equipment Recommendations  None recommended by PT    Recommendations for Other Services  None  Frequency Min 3X/week   Progress towards PT Goals Progress towards PT goals: Progressing toward goals  Plan Current plan remains appropriate    Precautions / Restrictions Precautions Precautions: Fall   Pertinent Vitals/Pain 8/10 in ankles with ambulation    Mobility  Bed Mobility General bed mobility comments: NT patient in recliner Transfers Overall transfer level: Needs assistance Transfers: Sit to/from Stand Sit to Stand: Supervision General transfer comment: cues for hand placement and safety; tends to sit uncontrolled in recliner Ambulation/Gait Ambulation/Gait assistance: Supervision Ambulation Distance (Feet): 90 Feet (10' and 20' x 2) Assistive device: Rolling walker (2 wheeled) Gait Pattern/deviations: Step-through pattern;Decreased stride length General Gait Details: assist for safety, c/o ankle pain/stiffness with ambulation      PT Goals (current goals can now be found in the care plan section)     Visit Information  Last PT Received On: 07/01/13 Assistance Needed: +1 History of Present Illness: 53 yo female admitted with encephalopathy due to unintentional OD of psych meds with resultant acute resp failure. Hx of bipolar d/o, HTN, R TKA    Subjective Data   Just feel so stiff   Cognition  Cognition Arousal/Alertness: Awake/alert Behavior During Therapy: WFL for tasks assessed/performed Overall Cognitive Status: Within Functional Limits for tasks assessed    Balance  Balance Overall balance assessment: Needs assistance Standing balance support: Bilateral upper extremity supported Standing balance-Leahy Scale: Poor Standing balance comment: needs UE support for balance  End of Session PT - End of Session Equipment Utilized During Treatment: Gait belt Activity Tolerance: Patient limited by pain Patient left: in chair;with call bell/phone within reach;with chair alarm set   GP     [facility] 07/01/2013, 1:31 PM [name], [name] 07/01/2013

## 2013-07-15 DIAGNOSIS — N184 Chronic kidney disease, stage 4 (severe): Secondary | ICD-10-CM | POA: Diagnosis not present

## 2013-07-15 DIAGNOSIS — I10 Essential (primary) hypertension: Secondary | ICD-10-CM | POA: Diagnosis not present

## 2013-07-29 ENCOUNTER — Encounter: Payer: Self-pay | Admitting: Vascular Surgery

## 2013-07-30 ENCOUNTER — Other Ambulatory Visit (HOSPITAL_COMMUNITY): Payer: Self-pay

## 2013-07-30 ENCOUNTER — Encounter: Payer: Self-pay | Admitting: Vascular Surgery

## 2013-08-27 DIAGNOSIS — F4310 Post-traumatic stress disorder, unspecified: Secondary | ICD-10-CM | POA: Diagnosis not present

## 2013-08-30 DIAGNOSIS — R946 Abnormal results of thyroid function studies: Secondary | ICD-10-CM | POA: Diagnosis not present

## 2013-08-30 DIAGNOSIS — I509 Heart failure, unspecified: Secondary | ICD-10-CM | POA: Diagnosis not present

## 2013-08-30 DIAGNOSIS — E785 Hyperlipidemia, unspecified: Secondary | ICD-10-CM | POA: Diagnosis not present

## 2013-08-30 DIAGNOSIS — R0609 Other forms of dyspnea: Secondary | ICD-10-CM | POA: Diagnosis not present

## 2013-08-30 DIAGNOSIS — D649 Anemia, unspecified: Secondary | ICD-10-CM | POA: Diagnosis not present

## 2013-08-30 DIAGNOSIS — N183 Chronic kidney disease, stage 3 unspecified: Secondary | ICD-10-CM | POA: Diagnosis not present

## 2013-08-30 DIAGNOSIS — J45909 Unspecified asthma, uncomplicated: Secondary | ICD-10-CM | POA: Diagnosis not present

## 2013-08-30 DIAGNOSIS — R4182 Altered mental status, unspecified: Secondary | ICD-10-CM | POA: Diagnosis not present

## 2013-08-30 DIAGNOSIS — G894 Chronic pain syndrome: Secondary | ICD-10-CM | POA: Diagnosis not present

## 2013-08-30 DIAGNOSIS — F17200 Nicotine dependence, unspecified, uncomplicated: Secondary | ICD-10-CM | POA: Diagnosis not present

## 2013-08-30 DIAGNOSIS — G479 Sleep disorder, unspecified: Secondary | ICD-10-CM | POA: Diagnosis not present

## 2013-09-20 ENCOUNTER — Emergency Department (HOSPITAL_COMMUNITY)
Admission: EM | Admit: 2013-09-20 | Discharge: 2013-09-21 | Disposition: A | Discharge status: Home / Self Care | Payer: Medicare Other | Source: Home / Self Care | Attending: Emergency Medicine | Admitting: Emergency Medicine

## 2013-09-20 ENCOUNTER — Encounter (HOSPITAL_COMMUNITY): Payer: Self-pay | Admitting: Emergency Medicine

## 2013-09-20 DIAGNOSIS — K219 Gastro-esophageal reflux disease without esophagitis: Secondary | ICD-10-CM | POA: Insufficient documentation

## 2013-09-20 DIAGNOSIS — R011 Cardiac murmur, unspecified: Secondary | ICD-10-CM

## 2013-09-20 DIAGNOSIS — F4310 Post-traumatic stress disorder, unspecified: Secondary | ICD-10-CM | POA: Insufficient documentation

## 2013-09-20 DIAGNOSIS — J4489 Other specified chronic obstructive pulmonary disease: Secondary | ICD-10-CM | POA: Insufficient documentation

## 2013-09-20 DIAGNOSIS — Z9981 Dependence on supplemental oxygen: Secondary | ICD-10-CM | POA: Diagnosis not present

## 2013-09-20 DIAGNOSIS — Z91199 Patient's noncompliance with other medical treatment and regimen due to unspecified reason: Secondary | ICD-10-CM | POA: Diagnosis not present

## 2013-09-20 DIAGNOSIS — F3130 Bipolar disorder, current episode depressed, mild or moderate severity, unspecified: Secondary | ICD-10-CM | POA: Diagnosis not present

## 2013-09-20 DIAGNOSIS — Z992 Dependence on renal dialysis: Secondary | ICD-10-CM

## 2013-09-20 DIAGNOSIS — G4700 Insomnia, unspecified: Secondary | ICD-10-CM | POA: Diagnosis present

## 2013-09-20 DIAGNOSIS — J449 Chronic obstructive pulmonary disease, unspecified: Secondary | ICD-10-CM | POA: Insufficient documentation

## 2013-09-20 DIAGNOSIS — E119 Type 2 diabetes mellitus without complications: Secondary | ICD-10-CM | POA: Insufficient documentation

## 2013-09-20 DIAGNOSIS — IMO0002 Reserved for concepts with insufficient information to code with codable children: Secondary | ICD-10-CM | POA: Insufficient documentation

## 2013-09-20 DIAGNOSIS — I120 Hypertensive chronic kidney disease with stage 5 chronic kidney disease or end stage renal disease: Secondary | ICD-10-CM

## 2013-09-20 DIAGNOSIS — G40909 Epilepsy, unspecified, not intractable, without status epilepticus: Secondary | ICD-10-CM

## 2013-09-20 DIAGNOSIS — Z96659 Presence of unspecified artificial knee joint: Secondary | ICD-10-CM | POA: Diagnosis not present

## 2013-09-20 DIAGNOSIS — R45851 Suicidal ideations: Secondary | ICD-10-CM | POA: Diagnosis not present

## 2013-09-20 DIAGNOSIS — N186 End stage renal disease: Secondary | ICD-10-CM | POA: Insufficient documentation

## 2013-09-20 DIAGNOSIS — F411 Generalized anxiety disorder: Secondary | ICD-10-CM | POA: Diagnosis present

## 2013-09-20 DIAGNOSIS — F333 Major depressive disorder, recurrent, severe with psychotic symptoms: Secondary | ICD-10-CM | POA: Diagnosis not present

## 2013-09-20 DIAGNOSIS — F3289 Other specified depressive episodes: Secondary | ICD-10-CM | POA: Diagnosis not present

## 2013-09-20 DIAGNOSIS — F17200 Nicotine dependence, unspecified, uncomplicated: Secondary | ICD-10-CM

## 2013-09-20 DIAGNOSIS — J45901 Unspecified asthma with (acute) exacerbation: Secondary | ICD-10-CM | POA: Diagnosis present

## 2013-09-20 DIAGNOSIS — R109 Unspecified abdominal pain: Secondary | ICD-10-CM | POA: Diagnosis not present

## 2013-09-20 DIAGNOSIS — I129 Hypertensive chronic kidney disease with stage 1 through stage 4 chronic kidney disease, or unspecified chronic kidney disease: Secondary | ICD-10-CM | POA: Diagnosis not present

## 2013-09-20 DIAGNOSIS — Z9119 Patient's noncompliance with other medical treatment and regimen: Secondary | ICD-10-CM | POA: Diagnosis not present

## 2013-09-20 DIAGNOSIS — G9389 Other specified disorders of brain: Secondary | ICD-10-CM | POA: Diagnosis not present

## 2013-09-20 DIAGNOSIS — Z8249 Family history of ischemic heart disease and other diseases of the circulatory system: Secondary | ICD-10-CM | POA: Diagnosis not present

## 2013-09-20 DIAGNOSIS — F332 Major depressive disorder, recurrent severe without psychotic features: Secondary | ICD-10-CM | POA: Diagnosis not present

## 2013-09-20 DIAGNOSIS — N183 Chronic kidney disease, stage 3 unspecified: Secondary | ICD-10-CM | POA: Diagnosis not present

## 2013-09-20 DIAGNOSIS — J42 Unspecified chronic bronchitis: Secondary | ICD-10-CM | POA: Diagnosis not present

## 2013-09-20 DIAGNOSIS — Z833 Family history of diabetes mellitus: Secondary | ICD-10-CM | POA: Diagnosis not present

## 2013-09-20 DIAGNOSIS — J441 Chronic obstructive pulmonary disease with (acute) exacerbation: Secondary | ICD-10-CM | POA: Diagnosis not present

## 2013-09-20 DIAGNOSIS — F29 Unspecified psychosis not due to a substance or known physiological condition: Secondary | ICD-10-CM | POA: Insufficient documentation

## 2013-09-20 DIAGNOSIS — F329 Major depressive disorder, single episode, unspecified: Secondary | ICD-10-CM

## 2013-09-20 DIAGNOSIS — Z79899 Other long term (current) drug therapy: Secondary | ICD-10-CM | POA: Insufficient documentation

## 2013-09-20 DIAGNOSIS — N185 Chronic kidney disease, stage 5: Secondary | ICD-10-CM | POA: Diagnosis present

## 2013-09-20 DIAGNOSIS — N189 Chronic kidney disease, unspecified: Secondary | ICD-10-CM

## 2013-09-20 DIAGNOSIS — J9819 Other pulmonary collapse: Secondary | ICD-10-CM | POA: Diagnosis not present

## 2013-09-20 DIAGNOSIS — N179 Acute kidney failure, unspecified: Secondary | ICD-10-CM | POA: Diagnosis not present

## 2013-09-20 LAB — CBC WITH DIFFERENTIAL/PLATELET
Basophils Absolute: 0.1 10*3/uL (ref 0.0–0.1)
Basophils Relative: 1 % (ref 0–1)
Eosinophils Absolute: 0.2 10*3/uL (ref 0.0–0.7)
Eosinophils Relative: 3 % (ref 0–5)
HCT: 34.2 % — ABNORMAL LOW (ref 36.0–46.0)
Hemoglobin: 11.1 g/dL — ABNORMAL LOW (ref 12.0–15.0)
Lymphocytes Relative: 39 % (ref 12–46)
Lymphs Abs: 2.8 10*3/uL (ref 0.7–4.0)
MCH: 30.1 pg (ref 26.0–34.0)
MCHC: 32.5 g/dL (ref 30.0–36.0)
MCV: 92.7 fL (ref 78.0–100.0)
Monocytes Absolute: 0.8 10*3/uL (ref 0.1–1.0)
Monocytes Relative: 11 % (ref 3–12)
Neutro Abs: 3.4 10*3/uL (ref 1.7–7.7)
Neutrophils Relative %: 46 % (ref 43–77)
Platelets: 235 10*3/uL (ref 150–400)
RBC: 3.69 MIL/uL — ABNORMAL LOW (ref 3.87–5.11)
RDW: 16.6 % — ABNORMAL HIGH (ref 11.5–15.5)
WBC: 7.2 10*3/uL (ref 4.0–10.5)

## 2013-09-20 LAB — COMPREHENSIVE METABOLIC PANEL
ALT: 13 U/L (ref 0–35)
AST: 22 U/L (ref 0–37)
Albumin: 3.7 g/dL (ref 3.5–5.2)
Alkaline Phosphatase: 66 U/L (ref 39–117)
BUN: 50 mg/dL — ABNORMAL HIGH (ref 6–23)
CO2: 24 mEq/L (ref 19–32)
Calcium: 11.1 mg/dL — ABNORMAL HIGH (ref 8.4–10.5)
Chloride: 101 mEq/L (ref 96–112)
Creatinine, Ser: 4.28 mg/dL — ABNORMAL HIGH (ref 0.50–1.10)
GFR calc Af Amer: 13 mL/min — ABNORMAL LOW (ref >90)
GFR calc non Af Amer: 11 mL/min — ABNORMAL LOW (ref >90)
Glucose, Bld: 80 mg/dL (ref 70–99)
Potassium: 5.1 mEq/L (ref 3.7–5.3)
Sodium: 139 mEq/L (ref 137–147)
Total Bilirubin: <0.2 mg/dL — ABNORMAL LOW (ref 0.3–1.2)
Total Protein: 7.9 g/dL (ref 6.0–8.3)

## 2013-09-20 LAB — URINALYSIS, ROUTINE W REFLEX MICROSCOPIC
Bilirubin Urine: NEGATIVE
Glucose, UA: NEGATIVE mg/dL
Hgb urine dipstick: NEGATIVE
Ketones, ur: NEGATIVE mg/dL
Nitrite: NEGATIVE
Protein, ur: 100 mg/dL — AB
Specific Gravity, Urine: 1.007 (ref 1.005–1.030)
Urobilinogen, UA: 0.2 mg/dL (ref 0.0–1.0)
pH: 6 (ref 5.0–8.0)

## 2013-09-20 LAB — URINE MICROSCOPIC-ADD ON

## 2013-09-20 MED ORDER — SODIUM CHLORIDE 0.9 % IV BOLUS (SEPSIS): 500.0000 mL | Freq: Once | INTRAVENOUS | Status: DC

## 2013-09-20 NOTE — ED Provider Notes (Signed)
CSN: [id number]     Arrival date & time 09/20/13  2201 History   First MD Initiated Contact with Patient 09/20/13 2228     Chief Complaint  Patient presents with  . Urinary Retention     (Consider location/radiation/quality/duration/timing/severity/associated sxs/prior Treatment) HPI Comments: Patient presents to the ER stating that she has not been able to urinate for a couple of days. She reports that she thinks she got food poisoning several days ago, but cannot elaborate. She has not reported vomiting or diarrhea. She says that she felt sick when she tries to eat, however. There has not been any fever. No back pain or flank pain.   Past Medical History  Diagnosis Date  . Mental disorder   . Depression   . Hypertension   . Overdose   . Tobacco use disorder 11/13/2012  . Complication of anesthesia     difficulty going to sleep  . Chronic kidney disease     06/11/13- not on dialysis  . Shortness of breath     lying down flat  . Diabetes mellitus without complication     denies  . PTSD (post-traumatic stress disorder)   . Asthma   . COPD (chronic obstructive pulmonary disease)   . Heart murmur   . GERD (gastroesophageal reflux disease)   . Seizures     "passsed out"  . History of blood transfusion    Past Surgical History  Procedure Laterality Date  . Right knee replacement      she says it was last year.  . Esophagogastroduodenoscopy Left 11/14/2012    Procedure: ESOPHAGOGASTRODUODENOSCOPY (EGD);  Surgeon: [name], MD;  Location: [facility];  Service: Endoscopy;  Laterality: Left;  . Joint replacement Right     knee  . Parathyroidectomy    . Av fistula placement Right 06/12/2013    Procedure: ARTERIOVENOUS (AV) FISTULA CREATION; RIGHT  BASILIC VEIN TRANSPOSITION with Intraoperative ultrasound;  Surgeon: Mal [name], MD;  Location: [facility] OR;  Service: Vascular;  Laterality: Right;   Family History  Problem Relation Age of Onset  . Diabetes Mother   . Hyperlipidemia  Mother   . Hypertension Mother   . Diabetes Father   . Hypertension Father   . Hyperlipidemia Father    History  Substance Use Topics  . Smoking status: Current Every Day Smoker -- 0.50 packs/day for 40 years    Types: Cigarettes  . Smokeless tobacco: Never Used  . Alcohol Use: No     Comment: none in over a month per daughter   OB History   Grav Para Term Preterm Abortions TAB SAB Ect Mult Living                 Review of Systems  Gastrointestinal: Positive for nausea.  Genitourinary: Positive for decreased urine volume.  All other systems reviewed and are negative.     Allergies  Codeine sulfate; Haldol; and Risperidone and related  Home Medications   Prior to Admission medications   Medication Sig Start Date End Date Taking? Authorizing Provider  albuterol (PROVENTIL HFA;VENTOLIN HFA) 108 (90 BASE) MCG/ACT inhaler Inhale 2 puffs into the lungs every 6 (six) hours as needed for wheezing or shortness of breath. 04/26/13   Erline Hau, MD  albuterol (PROVENTIL) (5 MG/ML) 0.5% nebulizer solution Take 2.5 mg by nebulization every 4 (four) hours as needed for wheezing or shortness of breath. 04/26/13   Erline Hau, MD  divalproex (DEPAKOTE) 500 MG DR tablet Take  1,000 mg by mouth at bedtime.     Historical Provider, MD  fluticasone (FLONASE) 50 MCG/ACT nasal spray Place 2 sprays into both nostrils daily.    Historical Provider, MD  Fluticasone-Salmeterol (ADVAIR) 100-50 MCG/DOSE AEPB Inhale 1 puff into the lungs 2 (two) times daily. 04/26/13   Erline Hau, MD  furosemide (LASIX) 80 MG tablet Take 120 mg by mouth 2 (two) times daily. Take 1 1/2 tablet by mouth two times daily.    Historical Provider, MD  gabapentin (NEURONTIN) 600 MG tablet Take 1,800 mg by mouth at bedtime. 04/26/13   Erline Hau, MD  hydrOXYzine (ATARAX/VISTARIL) 50 MG tablet Take 50 mg by mouth at bedtime.    Historical Provider, MD  metoprolol succinate  (TOPROL-XL) 100 MG 24 hr tablet Take 100 mg by mouth daily. Take with or immediately following a meal.    Historical Provider, MD  Multiple Vitamins-Minerals (CENTRUM SILVER PO) Take 1 tablet by mouth daily.    Historical Provider, MD  ziprasidone (GEODON) 60 MG capsule Take 120 mg by mouth at bedtime.    Historical Provider, MD   BP 153/93  Pulse 81  Temp(Src) 98.3 F (36.8 C) (Oral)  Resp 16  SpO2 100% Physical Exam  Constitutional: She is oriented to person, place, and time. She appears well-developed and well-nourished. No distress.  HENT:  Head: Normocephalic and atraumatic.  Right Ear: Hearing normal.  Left Ear: Hearing normal.  Nose: Nose normal.  Mouth/Throat: Oropharynx is clear and moist and mucous membranes are normal.  Eyes: Conjunctivae and EOM are normal. Pupils are equal, round, and reactive to light.  Neck: Normal range of motion. Neck supple.  Cardiovascular: Regular rhythm, S1 normal and S2 normal.  Exam reveals no gallop and no friction rub.   No murmur heard. Pulmonary/Chest: Effort normal and breath sounds normal. No respiratory distress. She exhibits no tenderness.  Abdominal: Soft. Normal appearance and bowel sounds are normal. There is no hepatosplenomegaly. There is no tenderness. There is no rebound, no guarding, no tenderness at McBurney's point and negative Murphy's sign. No hernia.  Musculoskeletal: Normal range of motion.  Neurological: She is alert and oriented to person, place, and time. She has normal strength. No cranial nerve deficit or sensory deficit. Coordination normal. GCS eye subscore is 4. GCS verbal subscore is 5. GCS motor subscore is 6.  Skin: Skin is warm, dry and intact. No rash noted. No cyanosis.  Psychiatric: She has a normal mood and affect. Her speech is normal and behavior is normal. Thought content normal.    ED Course  Procedures (including critical care time) Labs Review Labs Reviewed  CBC WITH DIFFERENTIAL  COMPREHENSIVE  METABOLIC PANEL  URINALYSIS, ROUTINE W REFLEX MICROSCOPIC    Imaging Review No results found.   EKG Interpretation None      MDM   Final diagnoses:  None    Patient difficult to evaluate because she has significant psychiatric disorder. She presented to the ER stating that she has not been able to urinate for several days. She cannot define it any more than. Abdominal exam is benign. No abdominal distention or bladder distention. Bladder scan revealed 200 mL of urine. Straight cath urine collection revealed a normal urinalysis.  Patient does have a history of end-stage renal disease. She did have recent acute on chronic renal failure that responded to IV fluids. Her baseline creatinine is around 3. She was just over 4 today, but that is down from her most recent  creatinine prior to discharge. The remainder of her workup was unremarkable. Patient was reassured, and kidneys are working as well as they generally do. She can followup as an outpatient with her doctor and kidney doctor.    Orpah Greek, MD 09/20/13 [phone]

## 2013-09-20 NOTE — Discharge Instructions (Signed)
Chronic Kidney Disease Chronic kidney disease occurs when the kidneys are damaged over a long period. The kidneys are two organs that lie on either side of the spine between the middle of the back and the front of the abdomen. The kidneys:   Remove wastes and extra water from the blood.   Produce important hormones. These help keep bones strong, regulate blood pressure, and help create red blood cells.   Balance the fluids and chemicals in the blood and tissues. A small amount of kidney damage may not cause problems, but a large amount of damage may make it difficult or impossible for the kidneys to work the way they should. If steps are not taken to slow down the kidney damage or stop it from getting worse, the kidneys may stop working permanently. Most of the time, chronic kidney disease does not go away. However, it can often be controlled, and those with the disease can usually live normal lives. CAUSES  The most common causes of chronic kidney disease are diabetes and high blood pressure (hypertension). Chronic kidney disease may also be caused by:   Diseases that cause kidneys' filters to become inflamed.   Diseases that affect the immune system.   Genetic diseases.   Medicines that damage the kidneys, such as anti-inflammatory medicines.  Poisoning or exposure to toxic substances.   A reoccurring kidney or urinary infection.   A problem with urine flow. This may be caused by:   Cancer.   Kidney stones.   An enlarged prostate in males. SYMPTOMS  Because the kidney damage in chronic kidney disease occurs slowly, symptoms develop slowly and may not be obvious until the kidney damage becomes severe. A person may have a kidney disease for years without showing any symptoms. Symptoms can include:   Swelling (edema) of the legs, ankles, or feet.   Tiredness (lethargy).   Nausea or vomiting.   Confusion.   Problems with urination, such as:   Decreased urine  production.   Frequent urination, especially at night.   Frequent accidents in children who are potty trained.   Muscle twitches and cramps.   Shortness of breath.  Weakness.   Persistent itchiness.   Loss of appetite.  Metallic taste in the mouth.  Trouble sleeping.  Slowed development in children.  Short stature in children. DIAGNOSIS  Chronic kidney disease may be detected and diagnosed by tests, including blood, urine, imaging, or kidney biopsy tests.  TREATMENT  Most chronic kidney diseases cannot be cured. Treatment usually involves relieving symptoms and preventing or slowing the progression of the disease. Treatment may include:   A special diet. You may need to avoid alcohol and foods thatare salty and high in potassium.   Medicines. These may:   Lower blood pressure.   Relieve anemia.   Relieve swelling.   Protect the bones. HOME CARE INSTRUCTIONS   Follow your prescribed diet.   Only take over-the-counter or prescription medicines as directed by your caregiver.  Do not take any new medicines (prescription, over-the-counter, or nutritional supplements) unless approved by your caregiver. Many medicines can worsen your kidney damage or need to have the dose adjusted.   Quit smoking if you are a smoker. Talk to your caregiver about a smoking cessation program.   Keep all follow-up appointments as directed by your caregiver. SEEK IMMEDIATE MEDICAL CARE IF:  Your symptoms get worse or you develop new symptoms.   You develop symptoms of end-stage kidney disease. These include:   Headaches.  Abnormally dark or light skin.   Numbness in the hands or feet.   Easy bruising.   Frequent hiccups.   Menstruation stops.   You have a fever.   You have decreased urine production.   You havepain or bleeding when urinating. MAKE SURE YOU:  Understand these instructions.  Will watch your condition.  Will get help right  away if you are not doing well or get worse. FOR MORE INFORMATION  American Association of Kidney Patients: BombTimer.gl National Kidney Foundation: www.kidney.[place]: https://mathis.com/ [facility]: www.lifeoptions.org and www.kidneyschool.org Document Released: 02/02/2008 Document Revised: 04/11/2012 Document Reviewed: 12/23/2011 [facility] Patient Information 2014 [place], [place].

## 2013-09-20 NOTE — ED Notes (Addendum)
Pt arrived to the Ed with a complaint of urinary retention.  Pt is a poor historian.  Pt has a fistula in her right arm.  Pt is set for dyalisis but has not made her first appt.  Pt states she has not urinated in the last several days.  Pt states her eating and drink habits have been sparse.  Pt talking slowly and not keeping up with present conversation.  Family friend had to leave but stated she would return.  Pt's friend unable to say if this was normal mental state or not.

## 2013-09-20 NOTE — ED Notes (Signed)
Initial Contact - pt to [facility], pt with odd affect, repeating "it's cold". Pt not answering questions.  Following simple directions.  Pt reports "i haven't peed".  Pt denies pain or other complaints at this time.  Awake, alert.  Skin PWD.  AVF to RUE, +bruit/+thrill.  RR even/un-lab.  MAEI, self repositioning for comfort.  Abd s/nt/nd, obese.  NAD.

## 2013-09-21 ENCOUNTER — Emergency Department (HOSPITAL_COMMUNITY): Payer: Medicare Other

## 2013-09-21 ENCOUNTER — Encounter (HOSPITAL_COMMUNITY): Payer: Self-pay | Admitting: Emergency Medicine

## 2013-09-21 ENCOUNTER — Emergency Department (EMERGENCY_DEPARTMENT_HOSPITAL)
Admission: EM | Admit: 2013-09-21 | Discharge: 2013-09-24 | Disposition: A | Discharge status: Other Acute Inpatient Hospital | Payer: Medicare Other | Source: Home / Self Care | Attending: Emergency Medicine | Admitting: Emergency Medicine

## 2013-09-21 DIAGNOSIS — R4182 Altered mental status, unspecified: Secondary | ICD-10-CM | POA: Insufficient documentation

## 2013-09-21 DIAGNOSIS — G40909 Epilepsy, unspecified, not intractable, without status epilepticus: Secondary | ICD-10-CM | POA: Insufficient documentation

## 2013-09-21 DIAGNOSIS — R011 Cardiac murmur, unspecified: Secondary | ICD-10-CM | POA: Insufficient documentation

## 2013-09-21 DIAGNOSIS — K219 Gastro-esophageal reflux disease without esophagitis: Secondary | ICD-10-CM | POA: Insufficient documentation

## 2013-09-21 DIAGNOSIS — F29 Unspecified psychosis not due to a substance or known physiological condition: Secondary | ICD-10-CM

## 2013-09-21 DIAGNOSIS — J449 Chronic obstructive pulmonary disease, unspecified: Secondary | ICD-10-CM | POA: Insufficient documentation

## 2013-09-21 DIAGNOSIS — J4489 Other specified chronic obstructive pulmonary disease: Secondary | ICD-10-CM | POA: Insufficient documentation

## 2013-09-21 DIAGNOSIS — F319 Bipolar disorder, unspecified: Secondary | ICD-10-CM

## 2013-09-21 DIAGNOSIS — F333 Major depressive disorder, recurrent, severe with psychotic symptoms: Secondary | ICD-10-CM

## 2013-09-21 DIAGNOSIS — N183 Chronic kidney disease, stage 3 (moderate): Secondary | ICD-10-CM

## 2013-09-21 DIAGNOSIS — J42 Unspecified chronic bronchitis: Secondary | ICD-10-CM

## 2013-09-21 DIAGNOSIS — H5316 Psychophysical visual disturbances: Secondary | ICD-10-CM | POA: Insufficient documentation

## 2013-09-21 DIAGNOSIS — N189 Chronic kidney disease, unspecified: Secondary | ICD-10-CM | POA: Insufficient documentation

## 2013-09-21 DIAGNOSIS — F4310 Post-traumatic stress disorder, unspecified: Secondary | ICD-10-CM | POA: Insufficient documentation

## 2013-09-21 DIAGNOSIS — F17200 Nicotine dependence, unspecified, uncomplicated: Secondary | ICD-10-CM | POA: Insufficient documentation

## 2013-09-21 DIAGNOSIS — Z79899 Other long term (current) drug therapy: Secondary | ICD-10-CM | POA: Insufficient documentation

## 2013-09-21 DIAGNOSIS — I129 Hypertensive chronic kidney disease with stage 1 through stage 4 chronic kidney disease, or unspecified chronic kidney disease: Secondary | ICD-10-CM | POA: Insufficient documentation

## 2013-09-21 DIAGNOSIS — E119 Type 2 diabetes mellitus without complications: Secondary | ICD-10-CM | POA: Insufficient documentation

## 2013-09-21 DIAGNOSIS — IMO0002 Reserved for concepts with insufficient information to code with codable children: Secondary | ICD-10-CM | POA: Insufficient documentation

## 2013-09-21 DIAGNOSIS — F911 Conduct disorder, childhood-onset type: Secondary | ICD-10-CM | POA: Insufficient documentation

## 2013-09-21 LAB — URINALYSIS, ROUTINE W REFLEX MICROSCOPIC
Bilirubin Urine: NEGATIVE
Glucose, UA: NEGATIVE mg/dL
Hgb urine dipstick: NEGATIVE
Ketones, ur: NEGATIVE mg/dL
Nitrite: NEGATIVE
Protein, ur: 100 mg/dL — AB
Specific Gravity, Urine: 1.012 (ref 1.005–1.030)
Urobilinogen, UA: 0.2 mg/dL (ref 0.0–1.0)
pH: 6.5 (ref 5.0–8.0)

## 2013-09-21 LAB — URINE MICROSCOPIC-ADD ON

## 2013-09-21 LAB — CBC WITH DIFFERENTIAL/PLATELET
Basophils Absolute: 0.1 10*3/uL (ref 0.0–0.1)
Basophils Relative: 1 % (ref 0–1)
Eosinophils Absolute: 0.1 10*3/uL (ref 0.0–0.7)
Eosinophils Relative: 2 % (ref 0–5)
HCT: 34.5 % — ABNORMAL LOW (ref 36.0–46.0)
Hemoglobin: 11.2 g/dL — ABNORMAL LOW (ref 12.0–15.0)
Lymphocytes Relative: 36 % (ref 12–46)
Lymphs Abs: 2.1 10*3/uL (ref 0.7–4.0)
MCH: 30.3 pg (ref 26.0–34.0)
MCHC: 32.5 g/dL (ref 30.0–36.0)
MCV: 93.2 fL (ref 78.0–100.0)
Monocytes Absolute: 0.5 10*3/uL (ref 0.1–1.0)
Monocytes Relative: 8 % (ref 3–12)
Neutro Abs: 3.1 10*3/uL (ref 1.7–7.7)
Neutrophils Relative %: 53 % (ref 43–77)
Platelets: 226 10*3/uL (ref 150–400)
RBC: 3.7 MIL/uL — ABNORMAL LOW (ref 3.87–5.11)
RDW: 16.7 % — ABNORMAL HIGH (ref 11.5–15.5)
WBC: 5.9 10*3/uL (ref 4.0–10.5)

## 2013-09-21 LAB — RAPID URINE DRUG SCREEN, HOSP PERFORMED
Amphetamines: NOT DETECTED
Barbiturates: NOT DETECTED
Benzodiazepines: NOT DETECTED
Cocaine: NOT DETECTED
Opiates: NOT DETECTED
Tetrahydrocannabinol: NOT DETECTED

## 2013-09-21 LAB — COMPREHENSIVE METABOLIC PANEL
ALT: 11 U/L (ref 0–35)
AST: 13 U/L (ref 0–37)
Albumin: 3.7 g/dL (ref 3.5–5.2)
Alkaline Phosphatase: 67 U/L (ref 39–117)
BUN: 51 mg/dL — ABNORMAL HIGH (ref 6–23)
CO2: 21 mEq/L (ref 19–32)
Calcium: 11.2 mg/dL — ABNORMAL HIGH (ref 8.4–10.5)
Chloride: 102 mEq/L (ref 96–112)
Creatinine, Ser: 4.06 mg/dL — ABNORMAL HIGH (ref 0.50–1.10)
GFR calc Af Amer: 13 mL/min — ABNORMAL LOW (ref >90)
GFR calc non Af Amer: 12 mL/min — ABNORMAL LOW (ref >90)
Glucose, Bld: 78 mg/dL (ref 70–99)
Potassium: 4.8 mEq/L (ref 3.7–5.3)
Sodium: 138 mEq/L (ref 137–147)
Total Bilirubin: 0.3 mg/dL (ref 0.3–1.2)
Total Protein: 8 g/dL (ref 6.0–8.3)

## 2013-09-21 LAB — CBG MONITORING, ED: Glucose-Capillary: 83 mg/dL (ref 70–99)

## 2013-09-21 LAB — ETHANOL: Alcohol, Ethyl (B): <11 mg/dL (ref 0–11)

## 2013-09-21 MED ORDER — FUROSEMIDE 80 MG PO TABS
120.0000 mg | ORAL_TABLET | Freq: Two times a day (BID) | ORAL | Status: DC
  Administered 2013-09-21 – 2013-09-24 (×5): 120 mg via ORAL
  Filled 2013-09-21 (×2): qty 1
  Filled 2013-09-21 (×2): qty 6
  Filled 2013-09-21: qty 1
  Filled 2013-09-21: qty 6
  Filled 2013-09-21: qty 1
  Filled 2013-09-21: qty 6
  Filled 2013-09-21: qty 1

## 2013-09-21 MED ORDER — DIVALPROEX SODIUM 250 MG PO DR TAB
1000.0000 mg | DELAYED_RELEASE_TABLET | Freq: Every day | ORAL | Status: DC
  Administered 2013-09-22: 1000 mg via ORAL
  Filled 2013-09-21 (×2): qty 4

## 2013-09-21 MED ORDER — FLUTICASONE PROPIONATE 50 MCG/ACT NA SUSP
2.0000 | Freq: Every day | NASAL | Status: DC
  Administered 2013-09-24: 2 via NASAL
  Filled 2013-09-21 (×2): qty 16

## 2013-09-21 MED ORDER — METOPROLOL SUCCINATE ER 100 MG PO TB24
100.0000 mg | ORAL_TABLET | Freq: Every day | ORAL | Status: DC
  Administered 2013-09-21 – 2013-09-24 (×3): 100 mg via ORAL
  Filled 2013-09-21 (×4): qty 1

## 2013-09-21 MED ORDER — HYDROXYZINE HCL 25 MG PO TABS: 50.0000 mg | ORAL_TABLET | Freq: Three times a day (TID) | ORAL | Status: DC | PRN

## 2013-09-21 MED ORDER — ONDANSETRON HCL 4 MG PO TABS: 4.0000 mg | ORAL_TABLET | Freq: Three times a day (TID) | ORAL | Status: DC | PRN

## 2013-09-21 MED ORDER — HYDROXYZINE HCL 25 MG PO TABS: 50.0000 mg | ORAL_TABLET | Freq: Every day | ORAL | Status: DC

## 2013-09-21 MED ORDER — IBUPROFEN 200 MG PO TABS
600.0000 mg | ORAL_TABLET | Freq: Three times a day (TID) | ORAL | Status: DC | PRN
  Administered 2013-09-24: 600 mg via ORAL
  Filled 2013-09-21: qty 3

## 2013-09-21 MED ORDER — ZIPRASIDONE HCL 20 MG PO CAPS
120.0000 mg | ORAL_CAPSULE | Freq: Every day | ORAL | Status: DC
  Administered 2013-09-22: 120 mg via ORAL
  Filled 2013-09-21 (×2): qty 6

## 2013-09-21 MED ORDER — SODIUM CHLORIDE 0.9 % IV BOLUS (SEPSIS)
1000.0000 mL | Freq: Once | INTRAVENOUS | Status: AC
  Administered 2013-09-21: 1000 mL via INTRAVENOUS

## 2013-09-21 MED ORDER — NICOTINE 21 MG/24HR TD PT24
21.0000 mg | MEDICATED_PATCH | Freq: Every day | TRANSDERMAL | Status: DC
  Administered 2013-09-22: 21 mg via TRANSDERMAL
  Filled 2013-09-21 (×2): qty 1

## 2013-09-21 MED ORDER — ALBUTEROL SULFATE (2.5 MG/3ML) 0.083% IN NEBU
2.5000 mg | INHALATION_SOLUTION | Freq: Four times a day (QID) | RESPIRATORY_TRACT | Status: DC | PRN
  Filled 2013-09-21: qty 3

## 2013-09-21 MED ORDER — ACETAMINOPHEN 325 MG PO TABS
650.0000 mg | ORAL_TABLET | ORAL | Status: DC | PRN
  Administered 2013-09-22 – 2013-09-24 (×2): 650 mg via ORAL
  Filled 2013-09-21 (×2): qty 2

## 2013-09-21 MED ORDER — ALBUTEROL SULFATE HFA 108 (90 BASE) MCG/ACT IN AERS: 2.0000 | INHALATION_SPRAY | Freq: Four times a day (QID) | RESPIRATORY_TRACT | Status: DC | PRN

## 2013-09-21 MED ORDER — MOMETASONE FURO-FORMOTEROL FUM 100-5 MCG/ACT IN AERO
2.0000 | INHALATION_SPRAY | Freq: Two times a day (BID) | RESPIRATORY_TRACT | Status: DC
  Administered 2013-09-22 – 2013-09-24 (×4): 2 via RESPIRATORY_TRACT
  Filled 2013-09-21 (×2): qty 8.8

## 2013-09-21 MED ORDER — GABAPENTIN 600 MG PO TABS
1800.0000 mg | ORAL_TABLET | Freq: Every day | ORAL | Status: DC
  Administered 2013-09-22: 1800 mg via ORAL
  Filled 2013-09-21 (×2): qty 3

## 2013-09-21 MED ORDER — LORAZEPAM 1 MG PO TABS
1.0000 mg | ORAL_TABLET | Freq: Three times a day (TID) | ORAL | Status: DC | PRN
Start: 2013-09-21 — End: 2013-09-24
  Administered 2013-09-22 – 2013-09-23 (×2): 1 mg via ORAL
  Filled 2013-09-21 (×2): qty 1

## 2013-09-21 NOTE — ED Provider Notes (Addendum)
TIME SEEN: 11:50 AM  CHIEF COMPLAINT: Altered mental status, depression, hallucinating, paranoid  HPI: Patient is a 53 y.o. F with history of depression, bipolar disorder, hypertension, chronic kidney disease is not currently on dialysis but has a right upper extremity fistula, diabetes, COPD who presents to the emergency department with concerns for psychosis. Patient's daughter reports for the past 2 weeks patient has slowly been declining. She has been very depressed and has flat affect. Patient's daughter reports that she has been hallucinating and seeing people that are not there. She states she is also been paranoid and states "somebody is trying to take my virginity". Daughter states the patient is also had angry outbursts and she is worried for her son's safety as the patient lives with her her 2 small children. Patient's daughter reports that she sent the patient to [facility] long yesterday but was unable to calm to the emergency department with her. In the emergency department, patient complained of urinary retention and had a bladder scan which revealed 200 mL with a normal urinalysis. She had mild elevation of her creatinine but was stable to be discharged. Daughter reports that she is worried for patient safety and safety of her family members and feel she needs inpatient psychiatric treatment. She states the patient has been not sleeping for 4 days and is not taking her medications. She states that she is not sure when the patient last urinated or had a bowel movement.   ROS: Unobtainable secondary to patient's altered mental status  PAST MEDICAL HISTORY/PAST SURGICAL HISTORY:  Past Medical History  Diagnosis Date  . Mental disorder   . Depression   . Hypertension   . Overdose   . Tobacco use disorder 11/13/2012  . Complication of anesthesia     difficulty going to sleep  . Chronic kidney disease     06/11/13- not on dialysis  . Shortness of breath     lying down flat  . Diabetes  mellitus without complication     denies  . PTSD (post-traumatic stress disorder)   . Asthma   . COPD (chronic obstructive pulmonary disease)   . Heart murmur   . GERD (gastroesophageal reflux disease)   . Seizures     "passsed out"  . History of blood transfusion     MEDICATIONS:  Prior to Admission medications   Medication Sig Start Date End Date Taking? Authorizing Provider  albuterol (PROVENTIL HFA;VENTOLIN HFA) 108 (90 BASE) MCG/ACT inhaler Inhale 2 puffs into the lungs every 6 (six) hours as needed for wheezing or shortness of breath. 04/26/13   Erline Hau, MD  albuterol (PROVENTIL) (5 MG/ML) 0.5% nebulizer solution Take 2.5 mg by nebulization every 4 (four) hours as needed for wheezing or shortness of breath. 04/26/13   Erline Hau, MD  divalproex (DEPAKOTE) 500 MG DR tablet Take 1,000 mg by mouth at bedtime.     Historical Provider, MD  fluticasone (FLONASE) 50 MCG/ACT nasal spray Place 2 sprays into both nostrils daily.    Historical Provider, MD  Fluticasone-Salmeterol (ADVAIR) 100-50 MCG/DOSE AEPB Inhale 1 puff into the lungs 2 (two) times daily. 04/26/13   Erline Hau, MD  furosemide (LASIX) 80 MG tablet Take 120 mg by mouth 2 (two) times daily. Take 1 1/2 tablet by mouth two times daily.    Historical Provider, MD  gabapentin (NEURONTIN) 600 MG tablet Take 1,800 mg by mouth at bedtime. 04/26/13   Erline Hau, MD  hydrOXYzine (ATARAX/VISTARIL)  50 MG tablet Take 50 mg by mouth at bedtime.    Historical Provider, MD  metoprolol succinate (TOPROL-XL) 100 MG 24 hr tablet Take 100 mg by mouth daily. Take with or immediately following a meal.    Historical Provider, MD  Multiple Vitamins-Minerals (CENTRUM SILVER PO) Take 1 tablet by mouth daily.    Historical Provider, MD  ziprasidone (GEODON) 60 MG capsule Take 120 mg by mouth at bedtime.    Historical Provider, MD    ALLERGIES:  Allergies  Allergen Reactions  . Codeine  Sulfate Anaphylaxis    Daughter called about having this allergy   . Haldol [Haloperidol Lactate] Shortness Of Breath  . Risperidone And Related Shortness Of Breath    SOCIAL HISTORY:  History  Substance Use Topics  . Smoking status: Current Every Day Smoker -- 2.00 packs/day for 40 years    Types: Cigarettes, Cigars  . Smokeless tobacco: Never Used  . Alcohol Use: No     Comment: none in over a month per daughter    FAMILY HISTORY: Family History  Problem Relation Age of Onset  . Diabetes Mother   . Hyperlipidemia Mother   . Hypertension Mother   . Diabetes Father   . Hypertension Father   . Hyperlipidemia Father     EXAM: BP 125/86  Pulse 85  Temp(Src) 99 F (37.2 C) (Oral)  Resp 18  SpO2 100% CONSTITUTIONAL: Alert but does not respond to questions or follow commands, no apparent distress, nontoxic HEAD: Normocephalic EYES: Conjunctivae clear, PERRL ENT: normal nose; no rhinorrhea; moist mucous membranes; pharynx without lesions noted NECK: Supple, no meningismus, no LAD  CARD: RRR; S1 and S2 appreciated; no murmurs, no clicks, no rubs, no gallops RESP: Normal chest excursion without splinting or tachypnea; breath sounds clear and equal bilaterally; no wheezes, no rhonchi, no rales,  ABD/GI: Normal bowel sounds; non-distended; soft, non-tender, no rebound, no guarding BACK:  The back appears normal and is non-tender to palpation, there is no CVA tenderness EXT: Normal ROM in all joints; non-tender to palpation; no edema; normal capillary refill; no cyanosis    SKIN: Normal color for age and race; warm NEURO: Moves all extremities equally, no facial droop or slurred speech PSYCH: Patient has a flat affect and when she does answer questions it is inappropriate. Grooming and personal hygiene are appropriate.  MEDICAL DECISION MAKING: Patient here with concerns for depression, psychosis. Patient's daughter is concerned for her safety. We'll obtain screening labs and  urine as well as an acute abdominal series given concerns for possible constipation as well as a bladder scan. We'll give IV fluids as patient's daughter reports she has not been eating and drinking well she did have a recent elevated creatinine. We'll discuss with psychiatry once medically cleared for evaluation. Will take out IVC paperwork. Patient's daughter reports that normally when she has symptoms similar she is more manic than today. Given this is a change from her baseline although likely psychiatric in nature, will obtain a CT of her head to rule out any organic causes for her altered mental status.  ED PROGRESS: Labs are unremarkable - Cr is at baseline, no vacations for emergent dialysis. She is still making urine. She is on hypertensive. Her potassium is normal.. Her abdominal series shows moderate stool in the colon but no obstruction, free air or impaction. She had less than 100 mL of urine in her bladder. Urinalysis and drug screen are pending. Head CT is also pending. We'll consult TTS.  3:13 PM  Pt's head CT shows no acute abnormality. I feel her symptoms today are likely secondary to psychosis due to her bipolar disorder. She has been involuntarily committed. TTS consult is pending. She is medically clear.   Donnald Garre - [place], DO 09/21/13 [place] Benney [place], DO 09/21/13 [place] Chany [place], DO 09/21/13 1600

## 2013-09-21 NOTE — ED Notes (Signed)
rn has not heard from case management. rn called pts pastor who said to call family friend Jennifer Lawson, who is pts 2nd emergency contact. rn left message.  rn attempted to call pts emergency contact, phone line says "unable to take phone calls at this time".

## 2013-09-21 NOTE — ED Notes (Signed)
Secretary has called for case management, they should be in department at 0830

## 2013-09-21 NOTE — ED Notes (Signed)
PT brought here by daughter for manic behavior.  Daughter states pt has been throwing away meds, pacing floors, not urinating or having bm x 2 days.  Pt refusing to eat.  Was seen at [facility] and discharged for chronic kidney failure.  Pt was in waiting room x 1 hour and assisted to room with aid of security and police.

## 2013-09-21 NOTE — ED Notes (Signed)
Spoke to dispatch at [employer]. Called for [employer] welfare check.
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
## 2013-09-21 NOTE — ED Notes (Signed)
Attempted to contact daughter via number provided by pt. [phone]).  Phone rang but was unable to reach anyone. Phone number did not have a voicemail set up.

## 2013-09-21 NOTE — BH Assessment (Signed)
[place] Assessment Progress Note  CSW attempted to assess pt at this time but was told by RN that pt is in cat scan right now.  CSW waiting for call back when pt is ready to be assessed.    Regan Lemming, LCSW 09/21/2013  3:15 PM

## 2013-09-21 NOTE — Progress Notes (Signed)
Weekend CSW continuing to follow for assistance with disposition as needed.   Tilden Fossa, MSW, [place]  Clinical Social Worker [facility] Emergency Dept. [phone]

## 2013-09-21 NOTE — ED Notes (Signed)
Per patient chart patient was receiving services with Jennifer Lawson. Called and left message requesting a call back to obtain additional contact information and any other possible information r/t patient home environment.

## 2013-09-21 NOTE — Progress Notes (Signed)
Patient ID: Jennifer Lawson, female   DOB: 04/28/1961, 53 y.o.   MRN: [id number]  Consulted Dr Dwyane Dee regarding the inability to assess patient via telepsych and unavailability of bed at [facility] and our [facility].  Dr Dwyane Dee agrees that patient should remain at [facility] ER and should be given her medications as ordered.  Patient is ordered Ativan and Geodone and has not gotten any per her nurse.  Ms [place] nurse was called and instructed that patient will need her [place] with food and that we need to use Vistaril 50 mg po every 8 hours as needed for anxiety.  We will interchange Ativan and Vistaril to avoid Benzodiazepine addiction. We will attempt to evaluate in am.  Charmaine Downs   PMHNP-BC

## 2013-09-21 NOTE — ED Notes (Signed)
All contact numbers have been attempted again but no answer.

## 2013-09-21 NOTE — ED Notes (Signed)
[employer] called back to state there was no one present at the patient's home. EDP Marnette Burgess) notified.

## 2013-09-21 NOTE — Progress Notes (Signed)
Patient ID: Jennifer Lawson, female   DOB: 11-21-1960, 53 y.o.   MRN: [id number] Attempted to speak with patient via telepsych but patient did not talk to extender.  She was able to say "wait a minute" and was looking around in her room.  She appeared anxious and not willing to talk.  Efforts to contact her daughter has failed because she is not answering  her phone and there is no answering mechine.  Charmaine Downs   PMHNP-BC

## 2013-09-21 NOTE — BH Assessment (Signed)
[place] Assessment Progress Note  CSW attempted to assess pt at this time.  Pt was observed to talk to the nurse and say "wait a minute!" but once the nurse left the room, pt stared at the camera, looked around the room and refused to talk or answer any questions asked by CSW.  CSW spoke with Hassan Rowan, RN who states pt has been doing this all day but pt does talk to daughter.  RN unable to reach daughter.  CSW attempted to reach pt's daughter at [phone] but no one answered and no voicemail is set up.  Reginold Agent, NP is currently attempting to assess pt and is talking to [place], [place] about what to do next.    Regan Lemming, LCSW 09/21/2013  4:03 PM

## 2013-09-21 NOTE — ED Notes (Signed)
pts daughter showed up, stating pts friend took her to the ED and left "her kids at home alone", stating pt "has bipolar and I can't deal with this, she should have gone to [place] because she is a dialysis patient". rn explained pt came in with complaint of urinary retention. Daughter appeared flustered and said "I just need to go, I need to drive to [facility]". As pt being wheeled out in wheelchair daughter said "momma you fooled them again".  daughter appeared unhappy with pt.

## 2013-09-21 NOTE — ED Notes (Signed)
rn called a second time, family answered, will come pick up pt.

## 2013-09-21 NOTE — Progress Notes (Signed)
IVC paperwork completed by physician. CSW faxed to magistrate, called to confirm receipt of paperwork.  Tilden Fossa, MSW, [place] Clinical Social Worker [facility] Emergency Dept. [phone]

## 2013-09-21 NOTE — ED Notes (Signed)
Sharene Butters called back to ED. She was who brought pt into ED. She gave a different number for daughter.  Ambriana Selway [phone]  Attempted to call this number,unable to reach,  "voicemail has not been set up".

## 2013-09-21 NOTE — Progress Notes (Signed)
Weekend CSW attempted to speak with patient and daughter, daughter not present in room. Patient minimally verbally responsive to CSW interaction. CSW will check back in to attempt to speak with daughter if present.  Tilden Fossa, MSW, [place] Clinical Social Worker [facility] Emergency Dept. [phone]

## 2013-09-21 NOTE — ED Notes (Signed)
Pt is pending d/c, pts listed home number and emergency contacts called, no answer.

## 2013-09-21 NOTE — ED Notes (Signed)
Multiple attempts to contact family have been made including PD going to [address] no one at the residence.

## 2013-09-21 NOTE — Progress Notes (Signed)
Weekend CSW attempted to speak with patient and daughter but patient is not in room. Will check back.  Tilden Fossa, MSW, [place] Clinical Social Worker [facility] Emergency Dept. [phone]

## 2013-09-21 NOTE — ED Notes (Signed)
Spoke to on call nurse with [facility]. Per the records with [facility] the patient has never received any visits as they were unable to establish contact with the patient outside of the hospital. All contact numbers on file with [facility] are either disconnected or marked invalid number.

## 2013-09-22 MED ORDER — GABAPENTIN 600 MG PO TABS
600.0000 mg | ORAL_TABLET | Freq: Two times a day (BID) | ORAL | Status: DC
  Filled 2013-09-22: qty 1

## 2013-09-22 MED ORDER — ZIPRASIDONE HCL 20 MG PO CAPS
60.0000 mg | ORAL_CAPSULE | Freq: Two times a day (BID) | ORAL | Status: DC
  Administered 2013-09-22 – 2013-09-24 (×4): 60 mg via ORAL
  Filled 2013-09-22 (×4): qty 3

## 2013-09-22 MED ORDER — GABAPENTIN 300 MG PO CAPS
600.0000 mg | ORAL_CAPSULE | Freq: Two times a day (BID) | ORAL | Status: DC
  Administered 2013-09-22 – 2013-09-24 (×4): 600 mg via ORAL
  Filled 2013-09-22 (×5): qty 2

## 2013-09-22 MED ORDER — DIVALPROEX SODIUM ER 250 MG PO TB24
250.0000 mg | ORAL_TABLET | Freq: Two times a day (BID) | ORAL | Status: DC
  Administered 2013-09-22 – 2013-09-24 (×4): 250 mg via ORAL
  Filled 2013-09-22 (×6): qty 1

## 2013-09-22 NOTE — ED Notes (Signed)
[employer] called to transport pt to [facility] ED.

## 2013-09-22 NOTE — Progress Notes (Signed)
Weekend CSW informed by RN that patient wanted to speak with counselor. CSW agreed to meet with patient to provide support.   CSW met with patient and introduced self. CSW attempted to engage patient in conversation, patient verbally unresponsive. Patient would not verbally respond to [place] questioning but stated "I want to call my social worker, I have medicaid" and "I want to talk to my children". CSW informed patient that CSW will check back this afternoon to see if patient would like to talk or has any questions.  Tilden Fossa, MSW, [place] Clinical Social Worker [facility] Emergency Dept. [phone]

## 2013-09-22 NOTE — ED Notes (Signed)
This RN spoke with pharmacy about pts bedtime medication. Pharmacy to send medication.

## 2013-09-22 NOTE — Consult Note (Signed)
Telepsych Consultation   Reason for Consult:  Non compliant with medications. Referring Physician:  EDP KAYLLA COBOS is an 53 y.o. female.  Assessment: AXIS I:  Depressive Disorder NOS and Post Traumatic Stress Disorder AXIS II:  Deferred AXIS III:   Past Medical History  Diagnosis Date  . Mental disorder   . Depression   . Hypertension   . Overdose   . Tobacco use disorder 11/13/2012  . Complication of anesthesia     difficulty going to sleep  . Chronic kidney disease     06/11/13- not on dialysis  . Shortness of breath     lying down flat  . Diabetes mellitus without complication     denies  . PTSD (post-traumatic stress disorder)   . Asthma   . COPD (chronic obstructive pulmonary disease)   . Heart murmur   . GERD (gastroesophageal reflux disease)   . Seizures     "passsed out"  . History of blood transfusion    AXIS IV:  economic problems, educational problems, housing problems, occupational problems, other psychosocial or environmental problems, problems related to legal system/crime, problems related to social environment, problems with access to health care services and problems with primary support group AXIS V:  11-20 some danger of hurting self or others possible OR occasionally fails to maintain minimal personal hygiene OR gross impairment in communication  Plan:  Recommend psychiatric Inpatient admission when medically cleared.  Subjective:   ERIAL FIKES is a 53 y.o. female patient bib daughter, after patient was not taking her medications as prescribed. She is a poor historian, not answering questions. She is thought blocked, and is noted to have abnormal oral movements around her mouth. She appears internally preoccupied. Daughter reports she has not been sleeping or 4 days, prior to coming to ED. She is suspicious of daughter.  HPI:  Patient is a 39 year old Caucasian female, with a h/o PTSD, depression.  Medically, she has chronic kidney disease,  hypertension, diabetes, copd, asthma, and gerd. The weekend CSW spoke with patient's daughter [name] [phone] via telephone. Daughter states that she was unable to speak with staff yesterday afternoon due to being at work. Daughter states that patient has been residing with her since January 2013. Since that time, daughter states that things have been "awful" due to patient having "episodes" and having to be brought to hospital every 2 to 3 months. Daughter states that patient has received inpatient treatment twice at a facility in [facility] for medication management but cannot remember name of facility. Daughter states that both psychiatric hospitalizations were beneficial for her mother.  Daughter states that patient does not follow "schedule" at home and does not take medications as prescribed. Daughter reports that patient overmedicates and is continuously asking for more medication. Daughter states that she attempts to oversee her mother's medication but that her mother will take more when daughter is not around, states that mother sometimes appears suspicious of daughter. Daughter states that patient recently threw away her gabapentin and has not been sleeping for approximately 4 days prior to current hospitalization. Daughter believes that inpatient treatment/medication management will be beneficial for her mother.  Daughter states that she wants to take care of her mother but that it is getting too difficult and that she needs help. Daughter believes that services such as a home health aide and mental health provider would be helpful to patient once she is stabilized and returns home. CSW informed daughter that CSW can provide information  and referrals on these services. CSW also spoke with daughter about possibility of assisted living facility in the future, daughter believes that ALF could be beneficial for patient. CSW agreed to make referrals for family to follow up on once patient returns  home. Daughter thanked CSW for assistance. CSW informed daughter that staff will be in touch with her once there is an update from her psychiatric assessment. Telephone number listed for daughter [phone] is a relative's phone but the best contact number to reach daughter at.   Past Psychiatric History: Past Medical History  Diagnosis Date  . Mental disorder   . Depression   . Hypertension   . Overdose   . Tobacco use disorder 11/13/2012  . Complication of anesthesia     difficulty going to sleep  . Chronic kidney disease     06/11/13- not on dialysis  . Shortness of breath     lying down flat  . Diabetes mellitus without complication     denies  . PTSD (post-traumatic stress disorder)   . Asthma   . COPD (chronic obstructive pulmonary disease)   . Heart murmur   . GERD (gastroesophageal reflux disease)   . Seizures     "passsed out"  . History of blood transfusion     reports that she has been smoking Cigarettes and Cigars.  She has a 80 pack-year smoking history. She has never used smokeless tobacco. She reports that she uses illicit drugs. She reports that she does not drink alcohol. Family History  Problem Relation Age of Onset  . Diabetes Mother   . Hyperlipidemia Mother   . Hypertension Mother   . Diabetes Father   . Hypertension Father   . Hyperlipidemia Father          Allergies:   Allergies  Allergen Reactions  . Codeine Sulfate Anaphylaxis    Daughter called about having this allergy   . Haldol [Haloperidol Lactate] Shortness Of Breath  . Risperidone And Related Shortness Of Breath    Objective: Blood pressure 107/71, pulse 66, temperature 97.6 F (36.4 C), temperature source Oral, resp. rate 17, SpO2 95.00%.There is no weight on file to calculate BMI. Results for orders placed during the hospital encounter of 09/21/13 (from the past 72 hour(s))  CBC WITH DIFFERENTIAL     Status: Abnormal   Collection Time    09/21/13 12:35 PM      Result Value Ref  Range   WBC 5.9  4.0 - 10.5 K/uL   RBC 3.70 (*) 3.87 - 5.11 MIL/uL   Hemoglobin 11.2 (*) 12.0 - 15.0 g/dL   HCT 34.5 (*) 36.0 - 46.0 %   MCV 93.2  78.0 - 100.0 fL   MCH 30.3  26.0 - 34.0 pg   MCHC 32.5  30.0 - 36.0 g/dL   RDW 16.7 (*) 11.5 - 15.5 %   Platelets 226  150 - 400 K/uL   Neutrophils Relative % 53  43 - 77 %   Lymphocytes Relative 36  12 - 46 %   Monocytes Relative 8  3 - 12 %   Eosinophils Relative 2  0 - 5 %   Basophils Relative 1  0 - 1 %   Neutro Abs 3.1  1.7 - 7.7 K/uL   Lymphs Abs 2.1  0.7 - 4.0 K/uL   Monocytes Absolute 0.5  0.1 - 1.0 K/uL   Eosinophils Absolute 0.1  0.0 - 0.7 K/uL   Basophils Absolute 0.1  0.0 - 0.1 K/uL  Smear Review MORPHOLOGY UNREMARKABLE    COMPREHENSIVE METABOLIC PANEL     Status: Abnormal   Collection Time    09/21/13 12:35 PM      Result Value Ref Range   Sodium 138  137 - 147 mEq/L   Potassium 4.8  3.7 - 5.3 mEq/L   Chloride 102  96 - 112 mEq/L   CO2 21  19 - 32 mEq/L   Glucose, Bld 78  70 - 99 mg/dL   BUN 51 (*) 6 - 23 mg/dL   Creatinine, Ser 4.06 (*) 0.50 - 1.10 mg/dL   Calcium 11.2 (*) 8.4 - 10.5 mg/dL   Total Protein 8.0  6.0 - 8.3 g/dL   Albumin 3.7  3.5 - 5.2 g/dL   AST 13  0 - 37 U/L   ALT 11  0 - 35 U/L   Alkaline Phosphatase 67  39 - 117 U/L   Total Bilirubin 0.3  0.3 - 1.2 mg/dL   GFR calc non Af Amer 12 (*) >90 mL/min   GFR calc Af Amer 13 (*) >90 mL/min   Comment: (NOTE)     The eGFR has been calculated using the CKD EPI equation.     This calculation has not been validated in all clinical situations.     eGFR's persistently <90 mL/min signify possible Chronic Kidney     Disease.  ETHANOL     Status: None   Collection Time    09/21/13 12:35 PM      Result Value Ref Range   Alcohol, Ethyl (B) <11  0 - 11 mg/dL   Comment:            LOWEST DETECTABLE LIMIT FOR     SERUM ALCOHOL IS 11 mg/dL     FOR MEDICAL PURPOSES ONLY  URINALYSIS, ROUTINE W REFLEX MICROSCOPIC     Status: Abnormal   Collection Time     09/21/13  1:40 PM      Result Value Ref Range   Color, Urine YELLOW  YELLOW   APPearance CLEAR  CLEAR   Specific Gravity, Urine 1.012  1.005 - 1.030   pH 6.5  5.0 - 8.0   Glucose, UA NEGATIVE  NEGATIVE mg/dL   Hgb urine dipstick NEGATIVE  NEGATIVE   Bilirubin Urine NEGATIVE  NEGATIVE   Ketones, ur NEGATIVE  NEGATIVE mg/dL   Protein, ur 100 (*) NEGATIVE mg/dL   Urobilinogen, UA 0.2  0.0 - 1.0 mg/dL   Nitrite NEGATIVE  NEGATIVE   Leukocytes, UA SMALL (*) NEGATIVE  URINE RAPID DRUG SCREEN (HOSP PERFORMED)     Status: None   Collection Time    09/21/13  1:40 PM      Result Value Ref Range   Opiates NONE DETECTED  NONE DETECTED   Cocaine NONE DETECTED  NONE DETECTED   Benzodiazepines NONE DETECTED  NONE DETECTED   Amphetamines NONE DETECTED  NONE DETECTED   Tetrahydrocannabinol NONE DETECTED  NONE DETECTED   Barbiturates NONE DETECTED  NONE DETECTED   Comment:            DRUG SCREEN FOR MEDICAL PURPOSES     ONLY.  IF CONFIRMATION IS NEEDED     FOR ANY PURPOSE, NOTIFY LAB     WITHIN 5 DAYS.                LOWEST DETECTABLE LIMITS     FOR URINE DRUG SCREEN     Drug Class       Cutoff (  ng/mL)     Amphetamine      1000     Barbiturate      200     Benzodiazepine   902     Tricyclics       409     Opiates          300     Cocaine          300     THC              50  URINE MICROSCOPIC-ADD ON     Status: None   Collection Time    09/21/13  1:40 PM      Result Value Ref Range   Squamous Epithelial / LPF RARE  RARE   WBC, UA 3-6  <3 WBC/hpf   RBC / HPF 0-2  <3 RBC/hpf   Bacteria, UA RARE  RARE   Labs are reviewed and are pertinent for  .  Current Facility-Administered Medications  Medication Dose Route Frequency Provider Last Rate Last Dose  . acetaminophen (TYLENOL) tablet 650 mg  650 mg Oral Q4H PRN Kristen N Ward, DO      . albuterol (PROVENTIL) (2.5 MG/3ML) 0.083% nebulizer solution 2.5 mg  2.5 mg Inhalation Q6H PRN Kristen N Ward, DO      . divalproex (DEPAKOTE) DR  tablet 1,000 mg  1,000 mg Oral QHS Kristen N Ward, DO   1,000 mg at 09/22/13 0103  . fluticasone (FLONASE) 50 MCG/ACT nasal spray 2 spray  2 spray Each Nare Daily Kristen N Ward, DO      . furosemide (LASIX) tablet 120 mg  120 mg Oral BID Kristen N Ward, DO   120 mg at 09/21/13 1855  . gabapentin (NEURONTIN) tablet 1,800 mg  1,800 mg Oral QHS Kristen N Ward, DO   1,800 mg at 09/22/13 0104  . hydrOXYzine (ATARAX/VISTARIL) tablet 50 mg  50 mg Oral Q8H PRN Delfin Gant, NP      . ibuprofen (ADVIL,MOTRIN) tablet 600 mg  600 mg Oral Q8H PRN Kristen N Ward, DO      . LORazepam (ATIVAN) tablet 1 mg  1 mg Oral Q8H PRN Kristen N Ward, DO      . metoprolol succinate (TOPROL-XL) 24 hr tablet 100 mg  100 mg Oral Daily Kristen N Ward, DO   100 mg at 09/21/13 1855  . mometasone-formoterol (DULERA) 100-5 MCG/ACT inhaler 2 puff  2 puff Inhalation BID Kristen N Ward, DO      . nicotine (NICODERM CQ - dosed in mg/24 hours) patch 21 mg  21 mg Transdermal Daily Kristen N Ward, DO      . ondansetron (ZOFRAN) tablet 4 mg  4 mg Oral Q8H PRN Kristen N Ward, DO      . ziprasidone (GEODON) capsule 120 mg  120 mg Oral QHS Kristen N Ward, DO   120 mg at 09/22/13 0103   Current Outpatient Prescriptions  Medication Sig Dispense Refill  . albuterol (PROVENTIL HFA;VENTOLIN HFA) 108 (90 BASE) MCG/ACT inhaler Inhale 2 puffs into the lungs every 6 (six) hours as needed for wheezing or shortness of breath.      Marland Kitchen albuterol (PROVENTIL) (5 MG/ML) 0.5% nebulizer solution Take 2.5 mg by nebulization every 4 (four) hours as needed for wheezing or shortness of breath.      . divalproex (DEPAKOTE) 500 MG DR tablet Take 1,000 mg by mouth at bedtime.       . fluticasone (FLONASE) 50  MCG/ACT nasal spray Place 2 sprays into both nostrils daily.      . Fluticasone-Salmeterol (ADVAIR) 100-50 MCG/DOSE AEPB Inhale 1 puff into the lungs 2 (two) times daily.      . furosemide (LASIX) 80 MG tablet Take 120 mg by mouth 2 (two) times daily. Take  1 1/2 tablet by mouth two times daily.      Marland Kitchen gabapentin (NEURONTIN) 600 MG tablet Take 1,800 mg by mouth at bedtime.      . hydrOXYzine (ATARAX/VISTARIL) 50 MG tablet Take 50 mg by mouth at bedtime.      . metoprolol succinate (TOPROL-XL) 100 MG 24 hr tablet Take 100 mg by mouth daily. Take with or immediately following a meal.      . Multiple Vitamins-Minerals (CENTRUM SILVER PO) Take 1 tablet by mouth daily.      . ziprasidone (GEODON) 60 MG capsule Take 120 mg by mouth at bedtime.        Psychiatric Specialty Exam:     Blood pressure 107/71, pulse 66, temperature 97.6 F (36.4 C), temperature source Oral, resp. rate 17, SpO2 95.00%.There is no weight on file to calculate BMI.  General Appearance: Casual and Disheveled  Eye Contact::  Minimal  Speech:  Garbled  Volume:  Decreased  Mood:  Depressed  Affect:  Flat  Thought Process:  thought blocked  Orientation:  Other:  Alert, oriented x 1  Thought Content:  Obsessions, Paranoid Ideation and Rumination  Suicidal Thoughts:  No  Homicidal Thoughts:  No  Memory:  Immediate;   Poor Recent;   Poor Remote;   Poor  Judgement:  Impaired  Insight:  Lacking  Psychomotor Activity:  Psychomotor Retardation  Concentration:  Poor  Recall:  Poor  Akathisia:  No  Handed:  Right  AIMS (if indicated):    TD around mouth  Assets:  Leisure Time Physical Health Resilience Social Support  Sleep:    poor    Treatment Plan Summary: Medication Management  Disposition:  Patient appears needing inpatient stabilization. She appears psychotic, internally preoccupied, and thought blocked. Noted to have some oral TD. She appears depressed, flat affect. She need IP. Discussed with Dr. Adele Schilder.      Meghan Blankmann 09/22/2013 11:52 AM   I agreed with the findings, treatment and disposition plan of this patient. Berniece Andreas, MD

## 2013-09-22 NOTE — ED Provider Notes (Signed)
Awaiting psych disposition.  Filed Vitals:   09/22/13 0531  BP: 107/71  Pulse: 66  Temp: 97.6 F (36.4 C)  Resp: [place] Rosalee Tolley, MD 09/22/13 [phone]

## 2013-09-22 NOTE — Progress Notes (Signed)
Weekend CSW spoke with patient's daughter Jennifer Lawson [phone] via telephone. Daughter states that she was unable to speak with staff yesterday afternoon due to being at work. Daughter states that patient has been residing with her since January 2013. Since that time, daughter states that things have been "awful" due to patient having "episodes" and having to be brought to hospital every 2 to 3 months. Daughter states that patient has received inpatient treatment twice at a facility in [facility] for medication management but cannot remember name of facility. Daughter states that both psychiatric hospitalizations were beneficial for her mother.   Daughter states that patient does not follow "schedule" at home and does not take medications as prescribed. Daughter reports that patient overmedicates and is continuously asking for more medication. Daughter states that she attempts to oversee her mother's medication but that her mother will take more when daughter is not around, states that mother sometimes appears suspicious of daughter. Daughter states that patient recently threw away her gabapentin and has not been sleeping for approximately 4 days prior to current hospitalization. Daughter believes that inpatient treatment/medication management will be beneficial for her mother.  Daughter states that she wants to take care of her mother but that it is getting too difficult and that she needs help. Daughter believes that services such as a home health aide and mental health provider would be helpful to patient once she is stabilized and returns home. CSW informed daughter that CSW can provide information and referrals on these services. CSW also spoke with daughter about possibility of assisted living facility in the future, daughter believes that ALF could be beneficial for patient. CSW agreed to make referrals for family to follow up on once patient returns home. Daughter thanked CSW for assistance. CSW  informed daughter that staff will be in touch with her once there is an update from her psychiatric assessment. Telephone number listed for daughter [phone] is a relative's phone but the best contact number to reach daughter at.  Tilden Fossa, MSW, [place] Clinical Social Worker [facility] Emergency Dept. [phone]

## 2013-09-22 NOTE — ED Provider Notes (Signed)
22:05- he was transferred to the [facility] long psychiatric unit, from [facility] emergency department, to be observed, treated by psychiatry, and admitted for acute psychosis. She's under involuntary commitment.  Patient is alert and cooperative. She does not express any needs at this time.  Richarda Blade, MD 09/22/13 [phone]

## 2013-09-23 DIAGNOSIS — F3289 Other specified depressive episodes: Secondary | ICD-10-CM | POA: Diagnosis not present

## 2013-09-23 DIAGNOSIS — F4310 Post-traumatic stress disorder, unspecified: Secondary | ICD-10-CM | POA: Diagnosis not present

## 2013-09-23 DIAGNOSIS — J9819 Other pulmonary collapse: Secondary | ICD-10-CM | POA: Diagnosis not present

## 2013-09-23 DIAGNOSIS — F329 Major depressive disorder, single episode, unspecified: Secondary | ICD-10-CM

## 2013-09-23 LAB — URINE CULTURE
Colony Count: NO GROWTH
Culture: NO GROWTH

## 2013-09-23 LAB — CBG MONITORING, ED
Glucose-Capillary: 124 mg/dL — ABNORMAL HIGH (ref 70–99)
Glucose-Capillary: 125 mg/dL — ABNORMAL HIGH (ref 70–99)

## 2013-09-23 NOTE — Progress Notes (Addendum)
Pt accepted to [facility] pending bed availability.  Pt referred to [facility] pending reivew.   No beds at [facility], [facility], [place], Alonza Bogus [phone]  ED CSW 09/23/2013 1222pm   Pt declined from [place] due to medical acuity.   Pt referred to [facility], pending reivew.  Pt referred to [facility] pending review.   Noreene Larsson [phone]  ED CSW 09/23/2013 1446pm

## 2013-09-23 NOTE — ED Notes (Signed)
Patient is restless and wandering. Paranoid and resistant to medications but did take with much encouragement.

## 2013-09-23 NOTE — Consult Note (Signed)
Psychiatry Consultation   Reason for Consult:  Non compliant with medications. Referring Physician:  [name] is an 53 y.o. female.  Assessment: AXIS I:  Depressive Disorder NOS and Post Traumatic Stress Disorder AXIS II:  Deferred AXIS III:   Past Medical History  Diagnosis Date  . Mental disorder   . Depression   . Hypertension   . Overdose   . Tobacco use disorder 11/13/2012  . Complication of anesthesia     difficulty going to sleep  . Chronic kidney disease     06/11/13- not on dialysis  . Shortness of breath     lying down flat  . Diabetes mellitus without complication     denies  . PTSD (post-traumatic stress disorder)   . Asthma   . COPD (chronic obstructive pulmonary disease)   . Heart murmur   . GERD (gastroesophageal reflux disease)   . Seizures     "passsed out"  . History of blood transfusion    AXIS IV:  economic problems, educational problems, housing problems, occupational problems, other psychosocial or environmental problems, problems related to legal system/crime, problems related to social environment, problems with access to health care services and problems with primary support group AXIS V:  11-20 some danger of hurting self or others possible OR occasionally fails to maintain minimal personal hygiene OR gross impairment in communication  Plan:  Recommend psychiatric Inpatient admission when medically cleared.  Subjective:   Jennifer Lawson is a 53 y.o. female patient bib daughter, after patient was not taking her medications as prescribed. She is a poor historian, not answering questions. She is thought blocked, and is noted to have abnormal oral movements around her mouth. She appears internally preoccupied. Daughter reports she has not been sleeping or 4 days, prior to coming to ED. She is suspicious of daughter.  HPI:  Patient is a 7 year old Caucasian female, with a h/o PTSD, depression.  Medically, she has chronic kidney disease,  hypertension, diabetes, copd, asthma, and gerd. The weekend CSW spoke with patient's daughter [name] [phone] via telephone. Daughter states that she was unable to speak with staff yesterday afternoon due to being at work. Daughter states that patient has been residing with her since January 2013. Since that time, daughter states that things have been "awful" due to patient having "episodes" and having to be brought to hospital every 2 to 3 months. Daughter states that patient has received inpatient treatment twice at a facility in [facility] for medication management but cannot remember name of facility. Daughter states that both psychiatric hospitalizations were beneficial for her mother.   On evaluation today, remains guarded and suspicious. Delayed and thought blocking.   Past Psychiatric History: Past Medical History  Diagnosis Date  . Mental disorder   . Depression   . Hypertension   . Overdose   . Tobacco use disorder 11/13/2012  . Complication of anesthesia     difficulty going to sleep  . Chronic kidney disease     06/11/13- not on dialysis  . Shortness of breath     lying down flat  . Diabetes mellitus without complication     denies  . PTSD (post-traumatic stress disorder)   . Asthma   . COPD (chronic obstructive pulmonary disease)   . Heart murmur   . GERD (gastroesophageal reflux disease)   . Seizures     "passsed out"  . History of blood transfusion     reports that she has been smoking  Cigarettes and Cigars.  She has a 80 pack-year smoking history. She has never used smokeless tobacco. She reports that she uses illicit drugs. She reports that she does not drink alcohol. Family History  Problem Relation Age of Onset  . Diabetes Mother   . Hyperlipidemia Mother   . Hypertension Mother   . Diabetes Father   . Hypertension Father   . Hyperlipidemia Father          Allergies:   Allergies  Allergen Reactions  . Codeine Sulfate Anaphylaxis    Daughter called  about having this allergy   . Haldol [Haloperidol Lactate] Shortness Of Breath  . Risperidone And Related Shortness Of Breath    Objective: Blood pressure 152/81, pulse 80, temperature 97.5 F (36.4 C), temperature source Oral, resp. rate 16, SpO2 97.00%.There is no weight on file to calculate BMI. Results for orders placed during the hospital encounter of 09/21/13 (from the past 72 hour(s))  CBC WITH DIFFERENTIAL     Status: Abnormal   Collection Time    09/21/13 12:35 PM      Result Value Ref Range   WBC 5.9  4.0 - 10.5 K/uL   RBC 3.70 (*) 3.87 - 5.11 MIL/uL   Hemoglobin 11.2 (*) 12.0 - 15.0 g/dL   HCT 34.5 (*) 36.0 - 46.0 %   MCV 93.2  78.0 - 100.0 fL   MCH 30.3  26.0 - 34.0 pg   MCHC 32.5  30.0 - 36.0 g/dL   RDW 16.7 (*) 11.5 - 15.5 %   Platelets 226  150 - 400 K/uL   Neutrophils Relative % 53  43 - 77 %   Lymphocytes Relative 36  12 - 46 %   Monocytes Relative 8  3 - 12 %   Eosinophils Relative 2  0 - 5 %   Basophils Relative 1  0 - 1 %   Neutro Abs 3.1  1.7 - 7.7 K/uL   Lymphs Abs 2.1  0.7 - 4.0 K/uL   Monocytes Absolute 0.5  0.1 - 1.0 K/uL   Eosinophils Absolute 0.1  0.0 - 0.7 K/uL   Basophils Absolute 0.1  0.0 - 0.1 K/uL   Smear Review MORPHOLOGY UNREMARKABLE    COMPREHENSIVE METABOLIC PANEL     Status: Abnormal   Collection Time    09/21/13 12:35 PM      Result Value Ref Range   Sodium 138  137 - 147 mEq/L   Potassium 4.8  3.7 - 5.3 mEq/L   Chloride 102  96 - 112 mEq/L   CO2 21  19 - 32 mEq/L   Glucose, Bld 78  70 - 99 mg/dL   BUN 51 (*) 6 - 23 mg/dL   Creatinine, Ser 4.06 (*) 0.50 - 1.10 mg/dL   Calcium 11.2 (*) 8.4 - 10.5 mg/dL   Total Protein 8.0  6.0 - 8.3 g/dL   Albumin 3.7  3.5 - 5.2 g/dL   AST 13  0 - 37 U/L   ALT 11  0 - 35 U/L   Alkaline Phosphatase 67  39 - 117 U/L   Total Bilirubin 0.3  0.3 - 1.2 mg/dL   GFR calc non Af Amer 12 (*) >90 mL/min   GFR calc Af Amer 13 (*) >90 mL/min   Comment: (NOTE)     The eGFR has been calculated using the CKD  EPI equation.     This calculation has not been validated in all clinical situations.     eGFR's  persistently <90 mL/min signify possible Chronic Kidney     Disease.  ETHANOL     Status: None   Collection Time    09/21/13 12:35 PM      Result Value Ref Range   Alcohol, Ethyl (B) <11  0 - 11 mg/dL   Comment:            LOWEST DETECTABLE LIMIT FOR     SERUM ALCOHOL IS 11 mg/dL     FOR MEDICAL PURPOSES ONLY  URINALYSIS, ROUTINE W REFLEX MICROSCOPIC     Status: Abnormal   Collection Time    09/21/13  1:40 PM      Result Value Ref Range   Color, Urine YELLOW  YELLOW   APPearance CLEAR  CLEAR   Specific Gravity, Urine 1.012  1.005 - 1.030   pH 6.5  5.0 - 8.0   Glucose, UA NEGATIVE  NEGATIVE mg/dL   Hgb urine dipstick NEGATIVE  NEGATIVE   Bilirubin Urine NEGATIVE  NEGATIVE   Ketones, ur NEGATIVE  NEGATIVE mg/dL   Protein, ur 100 (*) NEGATIVE mg/dL   Urobilinogen, UA 0.2  0.0 - 1.0 mg/dL   Nitrite NEGATIVE  NEGATIVE   Leukocytes, UA SMALL (*) NEGATIVE  URINE RAPID DRUG SCREEN (HOSP PERFORMED)     Status: None   Collection Time    09/21/13  1:40 PM      Result Value Ref Range   Opiates NONE DETECTED  NONE DETECTED   Cocaine NONE DETECTED  NONE DETECTED   Benzodiazepines NONE DETECTED  NONE DETECTED   Amphetamines NONE DETECTED  NONE DETECTED   Tetrahydrocannabinol NONE DETECTED  NONE DETECTED   Barbiturates NONE DETECTED  NONE DETECTED   Comment:            DRUG SCREEN FOR MEDICAL PURPOSES     ONLY.  IF CONFIRMATION IS NEEDED     FOR ANY PURPOSE, NOTIFY LAB     WITHIN 5 DAYS.                LOWEST DETECTABLE LIMITS     FOR URINE DRUG SCREEN     Drug Class       Cutoff (ng/mL)     Amphetamine      1000     Barbiturate      200     Benzodiazepine   720     Tricyclics       947     Opiates          300     Cocaine          300     THC              50  URINE MICROSCOPIC-ADD ON     Status: None   Collection Time    09/21/13  1:40 PM      Result Value Ref Range   Squamous  Epithelial / LPF RARE  RARE   WBC, UA 3-6  <3 WBC/hpf   RBC / HPF 0-2  <3 RBC/hpf   Bacteria, UA RARE  RARE  CBG MONITORING, ED     Status: Abnormal   Collection Time    09/22/13  9:35 PM      Result Value Ref Range   Glucose-Capillary 124 (*) 70 - 99 mg/dL   Comment 1 Notify RN     Labs are reviewed and are pertinent for  .  Current Facility-Administered Medications  Medication Dose Route Frequency Provider Last  Rate Last Dose  . acetaminophen (TYLENOL) tablet 650 mg  650 mg Oral Q4H PRN Kristen N Ward, DO   650 mg at 09/22/13 2008  . albuterol (PROVENTIL) (2.5 MG/3ML) 0.083% nebulizer solution 2.5 mg  2.5 mg Inhalation Q6H PRN Kristen N Ward, DO      . divalproex (DEPAKOTE ER) 24 hr tablet 250 mg  250 mg Oral BID [name] III, MD   250 mg at 09/23/13 0957  . fluticasone (FLONASE) 50 MCG/ACT nasal spray 2 spray  2 spray Each Nare Daily Kristen N Ward, DO      . furosemide (LASIX) tablet 120 mg  120 mg Oral BID Kristen N Ward, DO   120 mg at 09/23/13 1003  . gabapentin (NEURONTIN) capsule 600 mg  600 mg Oral BID Shea Stakes Crowford Tyler Deis., RPH   600 mg at 09/23/13 6962  . hydrOXYzine (ATARAX/VISTARIL) tablet 50 mg  50 mg Oral Q8H PRN Delfin Gant, NP      . ibuprofen (ADVIL,MOTRIN) tablet 600 mg  600 mg Oral Q8H PRN Kristen N Ward, DO      . LORazepam (ATIVAN) tablet 1 mg  1 mg Oral Q8H PRN Kristen N Ward, DO   1 mg at 09/22/13 2300  . metoprolol succinate (TOPROL-XL) 24 hr tablet 100 mg  100 mg Oral Daily Kristen N Ward, DO   100 mg at 09/23/13 0957  . mometasone-formoterol (DULERA) 100-5 MCG/ACT inhaler 2 puff  2 puff Inhalation BID Kristen N Ward, DO   2 puff at 09/22/13 1922  . nicotine (NICODERM CQ - dosed in mg/24 hours) patch 21 mg  21 mg Transdermal Daily Kristen N Ward, DO   21 mg at 09/22/13 1457  . ondansetron (ZOFRAN) tablet 4 mg  4 mg Oral Q8H PRN Kristen N Ward, DO      . ziprasidone (GEODON) capsule 60 mg  60 mg Oral BID WC [name] III, MD   60  mg at 09/23/13 9528   Current Outpatient Prescriptions  Medication Sig Dispense Refill  . albuterol (PROAIR HFA) 108 (90 BASE) MCG/ACT inhaler Inhale 1 puff into the lungs every 4 (four) hours as needed for wheezing or shortness of breath.      Marland Kitchen albuterol (PROVENTIL) (2.5 MG/3ML) 0.083% nebulizer solution Take 2.5 mg by nebulization 2 (two) times daily as needed for wheezing or shortness of breath.       . Aspirin-Acetaminophen-Caffeine (GOODY HEADACHE PO) Take 2 packets by mouth 2 (two) times daily as needed (pain).      Marland Kitchen divalproex (DEPAKOTE ER) 250 MG 24 hr tablet Take 250 mg by mouth 2 (two) times daily.      . Fluticasone-Salmeterol (ADVAIR) 100-50 MCG/DOSE AEPB Inhale 1 puff into the lungs 2 (two) times daily.      . furosemide (LASIX) 40 MG tablet Take 120 mg by mouth 2 (two) times daily.      Marland Kitchen gabapentin (NEURONTIN) 600 MG tablet Take 600 mg by mouth at bedtime.       . hydrOXYzine (ATARAX/VISTARIL) 25 MG tablet Take 25 mg by mouth 3 (three) times daily as needed for anxiety (sleep).      . Melatonin ([employer] MELATONIN) 5 MG TABS Take 5 mg by mouth at bedtime.      . metoprolol succinate (TOPROL-XL) 100 MG 24 hr tablet Take 100 mg by mouth at bedtime. Take with or immediately following a meal.      . Multiple Vitamins-Minerals (CENTRUM SILVER PO)  Take 1 tablet by mouth daily.      Marland Kitchen omeprazole (PRILOSEC) 20 MG capsule Take 20 mg by mouth daily.      . orphenadrine (NORFLEX) 100 MG tablet Take 100 mg by mouth 2 (two) times daily as needed for muscle spasms.      . ziprasidone (GEODON) 60 MG capsule Take 60 mg by mouth 2 (two) times daily with a meal.         Psychiatric Specialty Exam:     Blood pressure 152/81, pulse 80, temperature 97.5 F (36.4 C), temperature source Oral, resp. rate 16, SpO2 97.00%.There is no weight on file to calculate BMI.  General Appearance: Casual and Disheveled  Eye Contact::  Minimal  Speech:  Garbled  Volume:  Decreased  Mood:  Depressed  Affect:   Flat  Thought Process:  thought blocked  Orientation:  Other:  Alert, oriented x 1  Thought Content:  Obsessions, Paranoid Ideation and Rumination  Suicidal Thoughts:  No  Homicidal Thoughts:  No  Memory:  Immediate;   Poor Recent;   Poor Remote;   Poor  Judgement:  Impaired  Insight:  Lacking  Psychomotor Activity:  Psychomotor Retardation  Concentration:  Poor  Recall:  Poor  Akathisia:  No  Handed:  Right  AIMS (if indicated):    TD around mouth  Assets:  Leisure Time Physical Health Resilience Social Support  Sleep:    poor    Treatment Plan Summary: Medication Management  Disposition:  Patient appears needing inpatient stabilization. She continues to remain pre occupied and guarded. Admit to inpatient for stabilization.     Merian Capron MD 09/23/2013 11:14 AM

## 2013-09-23 NOTE — Progress Notes (Addendum)
Attempted to Secure placement at the following facilites:   [facility]- no beds [facility]- faxed information [facility]- faxed information [facility] beds [facility]- [facility] beds [facility]- no answer   Chesley Noon, MSW, [place], 09/23/2013 Evening Clinical Social Worker [phone]

## 2013-09-24 ENCOUNTER — Inpatient Hospital Stay (HOSPITAL_COMMUNITY)
Admission: AD | Admit: 2013-09-24 | Discharge: 2013-09-30 | DRG: 885 | Disposition: A | Discharge status: Home / Self Care | Payer: Medicare Other | Source: Intra-hospital | Attending: Psychiatry | Admitting: Psychiatry

## 2013-09-24 ENCOUNTER — Encounter (HOSPITAL_COMMUNITY): Payer: Self-pay | Admitting: *Deleted

## 2013-09-24 ENCOUNTER — Emergency Department (HOSPITAL_COMMUNITY): Payer: Medicare Other

## 2013-09-24 DIAGNOSIS — E875 Hyperkalemia: Secondary | ICD-10-CM

## 2013-09-24 DIAGNOSIS — Z91199 Patient's noncompliance with other medical treatment and regimen due to unspecified reason: Secondary | ICD-10-CM

## 2013-09-24 DIAGNOSIS — G9340 Encephalopathy, unspecified: Secondary | ICD-10-CM

## 2013-09-24 DIAGNOSIS — I120 Hypertensive chronic kidney disease with stage 5 chronic kidney disease or end stage renal disease: Secondary | ICD-10-CM | POA: Diagnosis present

## 2013-09-24 DIAGNOSIS — Z833 Family history of diabetes mellitus: Secondary | ICD-10-CM

## 2013-09-24 DIAGNOSIS — N189 Chronic kidney disease, unspecified: Secondary | ICD-10-CM

## 2013-09-24 DIAGNOSIS — J9600 Acute respiratory failure, unspecified whether with hypoxia or hypercapnia: Secondary | ICD-10-CM

## 2013-09-24 DIAGNOSIS — I10 Essential (primary) hypertension: Secondary | ICD-10-CM

## 2013-09-24 DIAGNOSIS — R000 Tachycardia, unspecified: Secondary | ICD-10-CM

## 2013-09-24 DIAGNOSIS — N185 Chronic kidney disease, stage 5: Secondary | ICD-10-CM | POA: Diagnosis present

## 2013-09-24 DIAGNOSIS — Z9981 Dependence on supplemental oxygen: Secondary | ICD-10-CM

## 2013-09-24 DIAGNOSIS — R400 Somnolence: Secondary | ICD-10-CM

## 2013-09-24 DIAGNOSIS — E860 Dehydration: Secondary | ICD-10-CM

## 2013-09-24 DIAGNOSIS — N186 End stage renal disease: Secondary | ICD-10-CM

## 2013-09-24 DIAGNOSIS — Z96659 Presence of unspecified artificial knee joint: Secondary | ICD-10-CM

## 2013-09-24 DIAGNOSIS — F17200 Nicotine dependence, unspecified, uncomplicated: Secondary | ICD-10-CM

## 2013-09-24 DIAGNOSIS — N179 Acute kidney failure, unspecified: Secondary | ICD-10-CM

## 2013-09-24 DIAGNOSIS — Z8249 Family history of ischemic heart disease and other diseases of the circulatory system: Secondary | ICD-10-CM

## 2013-09-24 DIAGNOSIS — Z9119 Patient's noncompliance with other medical treatment and regimen: Secondary | ICD-10-CM

## 2013-09-24 DIAGNOSIS — F333 Major depressive disorder, recurrent, severe with psychotic symptoms: Principal | ICD-10-CM

## 2013-09-24 DIAGNOSIS — E119 Type 2 diabetes mellitus without complications: Secondary | ICD-10-CM | POA: Diagnosis present

## 2013-09-24 DIAGNOSIS — T50901A Poisoning by unspecified drugs, medicaments and biological substances, accidental (unintentional), initial encounter: Secondary | ICD-10-CM

## 2013-09-24 DIAGNOSIS — E210 Primary hyperparathyroidism: Secondary | ICD-10-CM

## 2013-09-24 DIAGNOSIS — R112 Nausea with vomiting, unspecified: Secondary | ICD-10-CM

## 2013-09-24 DIAGNOSIS — F411 Generalized anxiety disorder: Secondary | ICD-10-CM | POA: Diagnosis present

## 2013-09-24 DIAGNOSIS — F4310 Post-traumatic stress disorder, unspecified: Secondary | ICD-10-CM | POA: Diagnosis present

## 2013-09-24 DIAGNOSIS — K219 Gastro-esophageal reflux disease without esophagitis: Secondary | ICD-10-CM | POA: Diagnosis present

## 2013-09-24 DIAGNOSIS — I5033 Acute on chronic diastolic (congestive) heart failure: Secondary | ICD-10-CM

## 2013-09-24 DIAGNOSIS — F319 Bipolar disorder, unspecified: Secondary | ICD-10-CM

## 2013-09-24 DIAGNOSIS — G4700 Insomnia, unspecified: Secondary | ICD-10-CM | POA: Diagnosis present

## 2013-09-24 DIAGNOSIS — R4182 Altered mental status, unspecified: Secondary | ICD-10-CM

## 2013-09-24 DIAGNOSIS — R0602 Shortness of breath: Secondary | ICD-10-CM

## 2013-09-24 DIAGNOSIS — T39091A Poisoning by salicylates, accidental (unintentional), initial encounter: Secondary | ICD-10-CM

## 2013-09-24 DIAGNOSIS — J441 Chronic obstructive pulmonary disease with (acute) exacerbation: Secondary | ICD-10-CM

## 2013-09-24 DIAGNOSIS — J449 Chronic obstructive pulmonary disease, unspecified: Secondary | ICD-10-CM | POA: Diagnosis present

## 2013-09-24 DIAGNOSIS — J45901 Unspecified asthma with (acute) exacerbation: Secondary | ICD-10-CM

## 2013-09-24 DIAGNOSIS — F29 Unspecified psychosis not due to a substance or known physiological condition: Secondary | ICD-10-CM

## 2013-09-24 MED ORDER — LORAZEPAM 1 MG PO TABS
1.0000 mg | ORAL_TABLET | Freq: Three times a day (TID) | ORAL | Status: DC | PRN
  Administered 2013-09-25 – 2013-09-29 (×2): 1 mg via ORAL
  Filled 2013-09-24 (×2): qty 1

## 2013-09-24 MED ORDER — NICOTINE 21 MG/24HR TD PT24
21.0000 mg | MEDICATED_PATCH | Freq: Every day | TRANSDERMAL | Status: DC
  Administered 2013-09-24 – 2013-09-30 (×6): 21 mg via TRANSDERMAL
  Filled 2013-09-24 (×9): qty 1

## 2013-09-24 MED ORDER — ALBUTEROL SULFATE (2.5 MG/3ML) 0.083% IN NEBU
2.5000 mg | INHALATION_SOLUTION | Freq: Four times a day (QID) | RESPIRATORY_TRACT | Status: DC
  Filled 2013-09-24 (×5): qty 3

## 2013-09-24 MED ORDER — METOPROLOL SUCCINATE ER 100 MG PO TB24
100.0000 mg | ORAL_TABLET | Freq: Every day | ORAL | Status: DC
  Administered 2013-09-25 – 2013-09-30 (×5): 100 mg via ORAL
  Filled 2013-09-24 (×8): qty 1

## 2013-09-24 MED ORDER — ZIPRASIDONE HCL 60 MG PO CAPS
60.0000 mg | ORAL_CAPSULE | Freq: Two times a day (BID) | ORAL | Status: DC
  Administered 2013-09-24: 60 mg via ORAL
  Filled 2013-09-24 (×6): qty 1

## 2013-09-24 MED ORDER — FLUTICASONE PROPIONATE 50 MCG/ACT NA SUSP
2.0000 | Freq: Every day | NASAL | Status: DC
  Administered 2013-09-25 – 2013-09-30 (×5): 2 via NASAL
  Filled 2013-09-24: qty 16

## 2013-09-24 MED ORDER — ONDANSETRON 4 MG PO TBDP
4.0000 mg | ORAL_TABLET | Freq: Three times a day (TID) | ORAL | Status: DC | PRN
  Administered 2013-09-27 – 2013-09-28 (×2): 4 mg via ORAL
  Filled 2013-09-24 (×2): qty 1

## 2013-09-24 MED ORDER — IBUPROFEN 600 MG PO TABS
600.0000 mg | ORAL_TABLET | Freq: Three times a day (TID) | ORAL | Status: DC | PRN
  Administered 2013-09-24: 600 mg via ORAL
  Filled 2013-09-24: qty 1

## 2013-09-24 MED ORDER — HYDROXYZINE HCL 50 MG PO TABS
50.0000 mg | ORAL_TABLET | Freq: Three times a day (TID) | ORAL | Status: AC | PRN
  Administered 2013-09-24: 50 mg via ORAL
  Filled 2013-09-24: qty 1

## 2013-09-24 MED ORDER — ACETAMINOPHEN 325 MG PO TABS
650.0000 mg | ORAL_TABLET | ORAL | Status: DC | PRN
  Administered 2013-09-25 – 2013-09-30 (×7): 650 mg via ORAL
  Filled 2013-09-24 (×7): qty 2

## 2013-09-24 MED ORDER — GABAPENTIN 300 MG PO CAPS
600.0000 mg | ORAL_CAPSULE | Freq: Two times a day (BID) | ORAL | Status: DC
  Administered 2013-09-24 – 2013-09-25 (×3): 600 mg via ORAL
  Filled 2013-09-24 (×8): qty 2

## 2013-09-24 MED ORDER — MAGNESIUM HYDROXIDE 400 MG/5ML PO SUSP
30.0000 mL | Freq: Every day | ORAL | Status: DC | PRN
  Administered 2013-09-24: 30 mL via ORAL

## 2013-09-24 MED ORDER — DIVALPROEX SODIUM ER 250 MG PO TB24
250.0000 mg | ORAL_TABLET | Freq: Two times a day (BID) | ORAL | Status: DC
  Administered 2013-09-24 – 2013-09-25 (×2): 250 mg via ORAL
  Filled 2013-09-24 (×4): qty 1

## 2013-09-24 MED ORDER — FUROSEMIDE 40 MG PO TABS
120.0000 mg | ORAL_TABLET | Freq: Two times a day (BID) | ORAL | Status: DC
  Administered 2013-09-24 – 2013-09-26 (×4): 120 mg via ORAL
  Filled 2013-09-24 (×2): qty 6
  Filled 2013-09-24 (×8): qty 3

## 2013-09-24 MED ORDER — NICOTINE 21 MG/24HR TD PT24
21.0000 mg | MEDICATED_PATCH | Freq: Every day | TRANSDERMAL | Status: DC
  Filled 2013-09-24 (×2): qty 1

## 2013-09-24 MED ORDER — MOMETASONE FURO-FORMOTEROL FUM 100-5 MCG/ACT IN AERO
2.0000 | INHALATION_SPRAY | Freq: Two times a day (BID) | RESPIRATORY_TRACT | Status: DC
  Administered 2013-09-24 – 2013-09-30 (×11): 2 via RESPIRATORY_TRACT
  Filled 2013-09-24: qty 8.8

## 2013-09-24 NOTE — Tx Team (Signed)
Initial Interdisciplinary Treatment Plan  PATIENT STRENGTHS: (choose at least two) General fund of knowledge Motivation for treatment/growth Supportive family/friends  PATIENT STRESSORS: Financial difficulties Health problems Medication change or noncompliance   PROBLEM LIST: Problem List/Patient Goals Date to be addressed Date deferred Reason deferred Estimated date of resolution  Suicidal ideation 09/24/2013   D/c        depression 09/24/2013   D/c        anxiety 09/24/2013   D/c                           DISCHARGE CRITERIA:  Ability to meet basic life and health needs Adequate post-discharge living arrangements Improved stabilization in mood, thinking, and/or behavior Medical problems require only outpatient monitoring Motivation to continue treatment in a less acute level of care Need for constant or close observation no longer present Safe-care adequate arrangements made Verbal commitment to aftercare and medication compliance  PRELIMINARY DISCHARGE PLAN: Attend aftercare/continuing care group Attend PHP/IOP Outpatient therapy Return to previous living arrangement  PATIENT/FAMIILY INVOLVEMENT: This treatment plan has been presented to and reviewed with the patient, Jennifer Lawson.  The patient and family have been given the opportunity to ask questions and make suggestions.  Cammy Copa 09/24/2013, 3:51 PM

## 2013-09-24 NOTE — Progress Notes (Signed)
Adult Psychoeducational Group Note  Date:  09/24/2013 Time:  10:35 PM  Group Topic/Focus:  Wrap-Up Group:   The focus of this group is to help patients review their daily goal of treatment and discuss progress on daily workbooks.  Participation Level:  Active  Participation Quality:  Appropriate  Affect:  Appropriate  Cognitive:  Appropriate  Insight: Appropriate  Engagement in Group:  Engaged  Modes of Intervention:  Discussion  Additional Comments:  Pt was present for wrap up group. Pt stated that she has had a good day because she is alive to live today. She got to se her family and she reports that her stay here will be beneficial for not only her, but also for her family. She learned today that she needs to slow down, and that is okay.    Sallie Maker A Alayssa Flinchum 09/24/2013, 10:35 PM

## 2013-09-24 NOTE — Progress Notes (Signed)
D: Patient in the hallway on approach.  Patient appears anxious and depressed.  Patient states she is the caregiver of her 54 year old grandson and states she cannot take the medication she is prescribed due to them making her drowsy.  Patient states she feels over medicated and states she has had tired where she stares into space. Patient states she wants to get her medications straightened out and states she wants to be discharged so she can get back home to her grandson.  Patient denies SI/HI and denies AVH. A: Staff to monitor Q 15 mins for safety.  Encouragement and support offered.  Scheduled medications administered per orders. Right arm AV fistula assessed and patent +bruit and +thrill. R: Patient remains safe on the unit.  Patient attended group tonight.  Patient visible on the unit and interacting with peers.  Patient taking administered medications.

## 2013-09-24 NOTE — Consult Note (Signed)
  Review of Systems  Constitutional: Negative.   HENT: Negative.   Eyes: Negative.   Respiratory: Negative.   Cardiovascular: Negative.   Gastrointestinal: Negative.   Genitourinary: Negative.   Musculoskeletal: Positive for back pain and joint pain.  Skin: Negative.   Neurological: Negative.   Endo/Heme/Allergies: Negative.   Psychiatric/Behavioral: Positive for depression.   Has general aches in her joints
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
## 2013-09-24 NOTE — BH Assessment (Signed)
Patient accepted to [facility] by Dr. Lovena Le to Dr. Louretta Shorten. The room assignment is 507-2. Nursing report [phone].
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
## 2013-09-24 NOTE — Consult Note (Addendum)
  Psychiatric Specialty Exam: Physical Exam  ROS  Blood pressure 113/64, pulse 73, temperature 97.8 F (36.6 C), temperature source Oral, resp. rate 18, SpO2 98.00%.There is no weight on file to calculate BMI.  General Appearance: Casual  Eye Contact::  Good  Speech:  Clear and Coherent  Volume:  Normal  Mood:  Anxious  Affect:  Appropriate  Thought Process:  Coherent and Logical  Orientation:  Full (Time, Place, and Person)  Thought Content:  Negative  Suicidal Thoughts:  No  Homicidal Thoughts:  No  Memory:  Immediate;   Good Recent;   Good Remote;   Good  Judgement:  Intact  Insight:  Shallow  Psychomotor Activity:  Normal  Concentration:  Good  Recall:  Good  Akathisia:  Negative  Handed:  Right  AIMS (if indicated):     Assets:  Communication Skills Desire for Improvement Housing Physical Health Resilience Social Support Transportation Vocational/Educational  Sleep:   good last night  Jennifer Lawson looks much better today.  Calm, cooperative and requesting to go home.  No evidence of mania.  However, because of the quick change and a history of quickly changing in the other direction, will request an Observation bed to see if the improvement persists. After further conversation with the team, it was decided that Jennifer Lawson would likely not do well in an Obs bed, so she will go to inpatient 507-2.

## 2013-09-25 DIAGNOSIS — F3130 Bipolar disorder, current episode depressed, mild or moderate severity, unspecified: Secondary | ICD-10-CM

## 2013-09-25 DIAGNOSIS — R45851 Suicidal ideations: Secondary | ICD-10-CM

## 2013-09-25 MED ORDER — IPRATROPIUM-ALBUTEROL 0.5-2.5 (3) MG/3ML IN SOLN: 3.0000 mL | Freq: Four times a day (QID) | RESPIRATORY_TRACT | Status: DC | PRN

## 2013-09-25 MED ORDER — ZIPRASIDONE HCL 60 MG PO CAPS
60.0000 mg | ORAL_CAPSULE | Freq: Every day | ORAL | Status: DC
  Administered 2013-09-25 – 2013-09-29 (×3): 60 mg via ORAL
  Filled 2013-09-25 (×7): qty 1

## 2013-09-25 MED ORDER — IPRATROPIUM-ALBUTEROL 0.5-2.5 (3) MG/3ML IN SOLN
RESPIRATORY_TRACT | Status: AC
  Administered 2013-09-25: 3 mL
  Filled 2013-09-25: qty 3

## 2013-09-25 MED ORDER — DIVALPROEX SODIUM ER 500 MG PO TB24
500.0000 mg | ORAL_TABLET | Freq: Every day | ORAL | Status: DC
  Administered 2013-09-26 – 2013-09-29 (×4): 500 mg via ORAL
  Filled 2013-09-25 (×6): qty 1

## 2013-09-25 NOTE — Progress Notes (Signed)
NUTRITION ASSESSMENT  Pt identified as at risk on the Malnutrition Screen Tool  INTERVENTION: 1. Educated patient on the importance of nutrition and encouraged intake of food and beverages.  Educated on a low sodium diet and general CKD diet.  Handout provided on CKD diet. (low sodium, low potassium, low phosphorus). 2. Discussed weight goals. 3. Supplements: none at this time.  NUTRITION DIAGNOSIS: Unintentional weight loss related to sub-optimal intake as evidenced by pt report.   Goal: Pt to meet >/= 90% of their estimated nutrition needs.  Monitor:  PO intake  Assessment:  Patient admitted with depressive disorder, PTSD.  Hx includes DM (HgBA1C 4.8 11/13/12) and blood sugars have been WNL currently.  Hx also includes CKD stage 4.  Patient reports having missed the appointment with nephrologist at [facility] secondary to current admit.  States that she has been told she will need dialysis.  Tried to avoid salt and sugar.  Poor appetite and intake prior to admit secondary to depression.  Current intake fair.  Weight loss currently down partially secondary to fluid loss.  No urine output at times and seeks medical attention.  BUN/Creat improved.  53 y.o. female  Height: Ht Readings from Last 1 Encounters:  09/24/13 5\' 2"  (1.575 m)    Weight: Wt Readings from Last 1 Encounters:  09/24/13 195 lb (88.451 kg)    Weight Hx: Wt Readings from Last 10 Encounters:  09/24/13 195 lb (88.451 kg)  06/30/13 225 lb 8.5 oz (102.3 kg)  06/11/13 213 lb (96.616 kg)  04/26/13 210 lb 5.1 oz (95.4 kg)  02/20/13 208 lb 12.8 oz (94.711 kg)  01/22/13 199 lb 1.2 oz (90.3 kg)  11/13/12 209 lb 3.5 oz (94.9 kg)  11/13/12 209 lb 3.5 oz (94.9 kg)  07/31/12 198 lb 6.6 oz (90 kg)  02/21/12 204 lb 9.4 oz (92.8 kg)    BMI:  Body mass index is 35.66 kg/(m^2). Pt meets criteria for obesity grade 2 based on current BMI.  Estimated Nutritional Needs: Kcal: 25-30 kcal/kg Protein: > 1 gram  protein/kg Fluid: 1 ml/kcal  Diet Order: Sodium Restricted Pt is also offered choice of unit snacks mid-morning and mid-afternoon.  Pt is eating as desired.   Lab results and medications reviewed.   Antonieta Iba, RD, LDN Clinical Inpatient Dietitian Pager:  [phone] Weekend and after hours pager:  [phone]
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
## 2013-09-25 NOTE — Tx Team (Signed)
Interdisciplinary Treatment Plan Update   Date Reviewed:  09/25/2013  Time Reviewed:  10:24 AM  Progress in Treatment:   Attending groups: Yes Participating in groups: Yes Taking medication as prescribed: Yes  Tolerating medication: Yes Family/Significant other contact made:  No, but will ask patient for consent for collateral contact Patient understands diagnosis: Yes  Discussing patient identified problems/goals with staff: Yes Medical problems stabilized or resolved: Yes Denies suicidal/homicidal ideation: Yes Patient has not harmed self or others: Yes  For review of initial/current patient goals, please see plan of care.  Estimated Length of Stay:  3-5 days  Reasons for Continued Hospitalization:  Anxiety Depression Medication stabilization   New Problems/Goals identified:    Discharge Plan or Barriers:   Home with outpatient follow up with Dr. Darleene Cleaver  Additional Comments:  Jennifer Lawson is a 53 y.o. female patient bib daughter, after patient was not taking her medications as prescribed. She is a poor historian, not answering questions. She is thought blocked, and is noted to have abnormal oral movements around her mouth. She appears internally preoccupied. Daughter reports she has not been sleeping or 4 days, prior to coming to ED. She is suspicious of daughter. Patient is a 60 year old Caucasian female, with a h/o PTSD, depression. Medically, she has chronic kidney disease, hypertension, diabetes, copd, asthma, and gerd. The weekend CSW spoke with patient's daughter [name] [phone] via telephone. Daughter states that she was unable to speak with staff yesterday afternoon due to being at work. Daughter states that patient has been residing with her since January 2013. Since that time, daughter states that things have been "awful" due to patient having "episodes" and having to be brought to hospital every 2 to 3 months. Daughter states that patient has received  inpatient treatment twice at a facility in [facility] for medication management but cannot remember name of facility. Daughter states that both psychiatric hospitalizations were beneficial for her mother.    Attendees:  Patient:  09/25/2013 10:24 AM   Signature: Mylinda Latina, MD 09/25/2013 10:24 AM  Signature:  Nena Polio, PA 09/25/2013 10:24 AM  Signature:  Eduard Roux, RN 09/25/2013 10:24 AM  Signature: 09/25/2013 10:24 AM  Signature:   09/25/2013 10:24 AM  Signature:  Joette Catching, LCSW 09/25/2013 10:24 AM  Signature:  Regan Lemming, LCSW 09/25/2013 10:24 AM  Signature:  Lucinda Dell, Care Coordinator [facility] 09/25/2013 10:24 AM  Signature:  Marshall Cork, RN 09/25/2013 10:24 AM  Signature:  09/25/2013  10:24 AM  Signature:   Lars Pinks, RN [facility] 09/25/2013  10:24 AM  Signature: 09/25/2013  10:24 AM    Scribe for Treatment Team:   Joette Catching,  09/25/2013 10:24 AM

## 2013-09-25 NOTE — Progress Notes (Signed)
Adult Psychoeducational Group Note  Date:  09/25/2013 Time:  9:49 PM  Group Topic/Focus:  Wrap-Up Group:   The focus of this group is to help patients review their daily goal of treatment and discuss progress on daily workbooks.  Participation Level:  Active  Participation Quality:  Appropriate  Affect:  Appropriate  Cognitive:  Appropriate  Insight: Appropriate  Engagement in Group:  Engaged  Modes of Intervention:  Support  Additional Comments:  Pt stated that she learned that it is best to believe in her higher power and that she cannot please everyone and when things happen, rather good or bad you must keep moving.   Necha Harries 09/25/2013, 9:49 PM

## 2013-09-25 NOTE — Progress Notes (Signed)
D: Patient appropriate and cooperative with staff and peers. Patient presents with anxious affect and mood. She rates depression "3" and feelings of hopelessness "0". Patient is actively participating in groups. Visible in the milieu. Compliant with current medication regimen.  A: Support and encouragement provided to patient. Scheduled medications administered per MD orders. Maintain Q15 minute checks for safety.  R: Patient receptive. Denies SI/HI and auditory/visual hallucinations. Patient remains safe.
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
## 2013-09-25 NOTE — Progress Notes (Signed)
Patient ID: Jennifer Lawson, female   DOB: November 04, 1960, 53 y.o.   MRN: [id number]   S: Notified by Rn patient endorsing SOB at rest without appearant wheeze, cough, rigors, chills,  PND or pillow orthopnea sx. Patient also denying any CP. Patient with reported hx of O2 dependent COPD as she frequently uses O2 at  2l / [place] [facility]. Patient has a hx of tobacco use of 1/2 PPD x 40 years. Patient also uses albuterol MDI at home prn, but hasn't had a treatment in several days. Patient is denying any swelling of her LE and or unexplained weight gain or truncal edema.  O: T 98.5 axillary, BP 128/69, Sat RA 91% and P 93 regular  Gen: 53 y/o female looking older than stated age, NAD Integument: W/D with fair turgor Neck: supple without JVD Pulm: decreased aeration at bases secondary to effort, noted slight exp wheeze appreciated, no crackles, rhonchi and or use of accessory muscles Cardio: Audible S1,S2, regular without M/G/R  A/P: Acute/ Chronic O2 dependent COPD exacerbation  * Duonebs q 6 hours prn * supplemental oxygen q HS at 2L/[place] * Consider solumedrol if expiratory wheezing persist after neb Tx Patient seen, evaluated and I agree with notes by Nurse Practitioner. Corena Pilgrim, MD

## 2013-09-25 NOTE — [facility] Group Notes (Signed)
[facility] LCSW Group Therapy  Emotional Regulation 1:15 - 2: 30 PM        09/25/2013     Type of Therapy:  Group Therapy  Participation Level:  Appropriate  Participation Quality:  Appropriate  Affect:  Appropriate  Cognitive:  Attentive Appropriate  Insight:  Developing/Improving Engaged  Engagement in Therapy:  Developing/Improving Engaged  Modes of Intervention:  Discussion Exploration Problem-Solving Supportive  Summary of Progress/Problems:  Group topic was emotional regulations.  Patient participated in the discussion and was able to identify an emotion that needed to regulated.  She reports the emotion sh has to control is frustration from not being able to please her children. Patient was able to identify approprite coping skills.  Concha Pyo 09/25/2013

## 2013-09-25 NOTE — [facility] Group Notes (Signed)
[facility] LCSW Aftercare Discharge Planning Group Note   09/25/2013 10:28 AM    Participation Quality:  Appropraite  Mood/Affect:  Appropriate  Depression Rating:  3  Anxiety Rating:  0  Thoughts of Suicide:  No  Will you contract for safety?   NA  Current AVH:  No  Plan for Discharge/Comments:  Patient attended discharge planning group and actively participated in group.  Patient advised of having a home and outpatient follow up with Dr. Darleene Cleaver CSW provided all participants with daily workbook.   Transportation Means: Patient has transportation.   Supports:  Patient has a support system.   Jarquez Mestre, Eulas Post

## 2013-09-25 NOTE — Care Management Utilization Note (Signed)
Per State Regulation 482.30  The chart was reviewed for necessity with respect to the patient's Admission/ Duration of stay. Admission 09/24/13 psychosis.  Next Review Date: 02/05/23  Conception Oms, RN, BSN

## 2013-09-26 DIAGNOSIS — N183 Chronic kidney disease, stage 3 unspecified: Secondary | ICD-10-CM

## 2013-09-26 DIAGNOSIS — F333 Major depressive disorder, recurrent, severe with psychotic symptoms: Secondary | ICD-10-CM

## 2013-09-26 DIAGNOSIS — J42 Unspecified chronic bronchitis: Secondary | ICD-10-CM

## 2013-09-26 DIAGNOSIS — F332 Major depressive disorder, recurrent severe without psychotic features: Secondary | ICD-10-CM

## 2013-09-26 DIAGNOSIS — J449 Chronic obstructive pulmonary disease, unspecified: Secondary | ICD-10-CM | POA: Diagnosis present

## 2013-09-26 LAB — VALPROIC ACID LEVEL: Valproic Acid Lvl: 13.5 ug/mL — ABNORMAL LOW (ref 50.0–100.0)

## 2013-09-26 MED ORDER — HYDROXYZINE HCL 50 MG PO TABS
50.0000 mg | ORAL_TABLET | Freq: Every evening | ORAL | Status: DC | PRN
  Administered 2013-09-27 – 2013-09-29 (×2): 50 mg via ORAL
  Filled 2013-09-26: qty 3
  Filled 2013-09-26 (×2): qty 1

## 2013-09-26 MED ORDER — LOPERAMIDE HCL 2 MG PO CAPS: 2.0000 mg | ORAL_CAPSULE | ORAL | Status: DC | PRN

## 2013-09-26 MED ORDER — GABAPENTIN 100 MG PO CAPS
200.0000 mg | ORAL_CAPSULE | Freq: Two times a day (BID) | ORAL | Status: DC
  Administered 2013-09-26 – 2013-09-30 (×8): 200 mg via ORAL
  Filled 2013-09-26 (×15): qty 2

## 2013-09-26 MED ORDER — FUROSEMIDE 80 MG PO TABS
80.0000 mg | ORAL_TABLET | Freq: Two times a day (BID) | ORAL | Status: DC
  Administered 2013-09-26 – 2013-09-30 (×8): 80 mg via ORAL
  Filled 2013-09-26 (×10): qty 1

## 2013-09-26 NOTE — Progress Notes (Signed)
D: Patient's affect blunted/labile and mood is agitated/anxious. Also, she appears very confused and suspicious of her medications and surroundings. Patient reported that she does not have any depression, hopelessness, or anxiety. She's not attending any groups. Patient refused all her morning medications except Furosemide. Complained of generalized pain.  A: Support and encouragement provided to patient. Attempted x3 to administer medication to the patient, but she refused all besides the above listed medication. PRN Tylenol given to the patient for pain. Monitor Q15 minute checks for safety.  R: Patient receptive. Denies SI/HI/AVH. Patient remains safe on the unit.

## 2013-09-26 NOTE — [facility] Suicide Risk Assessment (Signed)
[place] INPATIENT:  Family/Significant Other Suicide Prevention Education  Suicide Prevention Education:  Education Completed; Tanashia Ciesla, Mother, [phone]; has been identified by the patient as the family member/significant other with whom the patient will be residing, and identified as the person(s) who will aid the patient in the event of a mental health crisis (suicidal ideations/suicide attempt).  With written consent from the patient, the family member/significant other has been provided the following suicide prevention education, prior to the and/or following the discharge of the patient.  The suicide prevention education provided includes the following:  Suicide risk factors  Suicide prevention and interventions  National Suicide Hotline telephone number  [facility] assessment telephone number  [facility] Emergency Assistance [place] and/or Residential Mobile Crisis Unit telephone number  Request made of family/significant other to:  Remove weapons (e.g., guns, rifles, knives), all items previously/currently identified as safety concern. Daughter advised patient does not have access to weapons.  Remove drugs/medications (over-the-counter, prescriptions, illicit drugs), all items previously/currently identified as a safety concern.  The family member/significant other verbalizes understanding of the suicide prevention education information provided.  The family member/significant other agrees to remove the items of safety concern listed above.  Jacquelynne Guedes Hairston Alaric Gladwin 09/26/2013, 8:45 AM

## 2013-09-26 NOTE — Progress Notes (Signed)
D: Pt denies SI/HI/AVH. Pt is pleasant and cooperative. Pt appears a little paranoid, and anxious upon approach. Pt does not talk much to Probation officer. Pt did eat a salad tonight.   A: Pt was offered support and encouragement. Pt was given scheduled medications. Pt was encourage to attend groups. Q 15 minute checks were done for safety.   R: Pt is taking medication. Pt has no complaints at this time.Pt receptive to treatment and safety maintained on unit.

## 2013-09-26 NOTE — Progress Notes (Signed)
[facility] MD Progress Note  09/26/2013 10:13 AM Jennifer Lawson  MRN:  [id number] Subjective:  Pt seen and chart reviewed. Pt denies SI, HI, and AVH, contracts for safety. Pt reports that her anxiety and depression are both at 5/10. Pt reports that she felt overmedicated yesterday and that she was incontinent during the night; pt requests that we lower her medications in some areas to see if we can alleviate this. Pt also complains of intermittent nausea with loose stools.   HPI: Jennifer Lawson is a 53 y.o. female admitted voluntarily, emergently with increased symptoms of depression, safety concern due to lack of self care and patient was not taking her medications. She is a poor historian, not answering questions. She is thought blocked, and is noted to have abnormal oral movements around her mouth. She appears internally preoccupied. Daughter reports she has not been sleeping or 4 days, prior to coming to ED. She is suspicious of daughter. She is with a h/o PTSD, depression. She has multiple medical conditions including , chronic kidney disease, hypertension, diabetes, copd, asthma, and gerd. Patient daughter was unable to speak with staff yesterday afternoon due to being at work and feels she won't be able to care for her any longer. Daughter states that patient has been residing with her since January 2013. Since that time, daughter states that things have been "awful" due to patient having "episodes" and having to be brought to hospital every 2 to 3 months. Daughter states that patient has received inpatient treatment twice at a facility in [facility] for medication management but cannot remember name of facility. Daughter states that both psychiatric hospitalizations were beneficial for her mother. On evaluation today, remains guarded and suspicious. Delayed and thought blocking.     Diagnosis:   DSM5: Depressive Disorders:  Major Depressive Disorder - Severe (296.23) Total Time spent with  patient: 25 minutes  Axis I: Major Depression, Recurrent severe Axis II: Deferred Axis III:  Past Medical History  Diagnosis Date  . Mental disorder   . Depression   . Hypertension   . Overdose   . Tobacco use disorder 11/13/2012  . Complication of anesthesia     difficulty going to sleep  . Chronic kidney disease     06/11/13- not on dialysis  . Shortness of breath     lying down flat  . Diabetes mellitus without complication     denies  . PTSD (post-traumatic stress disorder)   . Asthma   . COPD (chronic obstructive pulmonary disease)   . Heart murmur   . GERD (gastroesophageal reflux disease)   . Seizures     "passsed out"  . History of blood transfusion    Axis IV: other psychosocial or environmental problems and problems related to social environment Axis V: 41-50 serious symptoms  ADL's:  Impaired  Sleep: Fair  Appetite:  Fair  Suicidal Ideation:  Denies Homicidal Ideation:  Denies AEB (as evidenced by):  Psychiatric Specialty Exam: Physical Exam  Review of Systems  Constitutional: Negative.   HENT: Negative.   Eyes: Negative.   Respiratory: Negative.   Cardiovascular: Negative.   Gastrointestinal: Negative.   Genitourinary: Negative.   Musculoskeletal: Negative.   Skin: Negative.   Neurological: Negative.   Endo/Heme/Allergies: Negative.   Psychiatric/Behavioral: Positive for depression. The patient is nervous/anxious.     Blood pressure 121/79, pulse 92, temperature 98 F (36.7 C), temperature source Oral, resp. rate 17, height 5\' 2"  (1.575 m), weight 88.451 kg (195 lb), last menstrual  period 05/09/2008, SpO2 93.00%.Body mass index is 35.66 kg/(m^2).  General Appearance: Casual  Eye Contact::  Good  Speech:  Clear and Coherent and Slow  Volume:  Decreased  Mood:  Depressed  Affect:  Depressed  Thought Process:  Coherent and Loose  Orientation:  Full (Time, Place, and Person)  Thought Content:  WDL  Suicidal Thoughts:  No  Homicidal Thoughts:   No  Memory:  Immediate;   Fair Recent;   Fair Remote;   Fair  Judgement:  Fair  Insight:  Fair  Psychomotor Activity:  Decreased  Concentration:  Good  Recall:  Ronan: Fair  Akathisia:  NA  Handed:    AIMS (if indicated):     Assets:  Desire for Improvement Resilience  Sleep:  Number of Hours: 5.5   Musculoskeletal: Strength & Muscle Tone: within normal limits Gait & Station: normal Patient leans: N/A  Current Medications: Current Facility-Administered Medications  Medication Dose Route Frequency Provider Last Rate Last Dose  . acetaminophen (TYLENOL) tablet 650 mg  650 mg Oral Q4H PRN Waylan Boga, NP   650 mg at 09/26/13 0824  . divalproex (DEPAKOTE ER) 24 hr tablet 500 mg  500 mg Oral QHS Durward Parcel, MD      . fluticasone (FLONASE) 50 MCG/ACT nasal spray 2 spray  2 spray Each Nare Daily Waylan Boga, NP   2 spray at 09/25/13 0806  . furosemide (LASIX) tablet 120 mg  120 mg Oral BID Waylan Boga, NP   120 mg at 09/26/13 7106  . gabapentin (NEURONTIN) capsule 600 mg  600 mg Oral BID Waylan Boga, NP   600 mg at 09/25/13 2049  . ibuprofen (ADVIL,MOTRIN) tablet 600 mg  600 mg Oral Q8H PRN Waylan Boga, NP   600 mg at 09/24/13 2203  . ipratropium-albuterol (DUONEB) 0.5-2.5 (3) MG/3ML nebulizer solution 3 mL  3 mL Nebulization Q6H PRN Laverle Hobby, PA-C      . loperamide (IMODIUM) capsule 2 mg  2 mg Oral PRN Laverle Hobby, PA-C      . LORazepam (ATIVAN) tablet 1 mg  1 mg Oral Q8H PRN Waylan Boga, NP   1 mg at 09/25/13 2328  . magnesium hydroxide (MILK OF MAGNESIA) suspension 30 mL  30 mL Oral Daily PRN Waylan Boga, NP   30 mL at 09/24/13 2115  . metoprolol succinate (TOPROL-XL) 24 hr tablet 100 mg  100 mg Oral Daily Waylan Boga, NP   100 mg at 09/25/13 0806  . mometasone-formoterol (DULERA) 100-5 MCG/ACT inhaler 2 puff  2 puff Inhalation BID Waylan Boga, NP   2 puff at 09/25/13 2048  . nicotine (NICODERM CQ - dosed in mg/24  hours) patch 21 mg  21 mg Transdermal Daily Durward Parcel, MD   21 mg at 09/25/13 0807  . ondansetron (ZOFRAN-ODT) disintegrating tablet 4 mg  4 mg Oral Q8H PRN Waylan Boga, NP      . ziprasidone (GEODON) capsule 60 mg  60 mg Oral Daily Nena Polio, PA-C   60 mg at 09/25/13 1744    Lab Results: No results found for this or any previous visit (from the past 48 hour(s)).  Physical Findings: AIMS: Facial and Oral Movements Muscles of Facial Expression: None, normal Lips and Perioral Area: None, normal Jaw: None, normal Tongue: None, normal,Extremity Movements Upper (arms, wrists, hands, fingers): None, normal Lower (legs, knees, ankles, toes): None, normal, Trunk Movements Neck, shoulders, hips: None, normal, Overall Severity Severity of  abnormal movements (highest score from questions above): None, normal Incapacitation due to abnormal movements: None, normal Patient's awareness of abnormal movements (rate only patient's report): No Awareness, Dental Status Current problems with teeth and/or dentures?: No Does patient usually wear dentures?: Yes  CIWA:  CIWA-Ar Total: 3 COWS:  COWS Total Score: 3  Treatment Plan Summary: Daily contact with patient to assess and evaluate symptoms and progress in treatment Medication management  Plan: Review of chart, vital signs, medications, and notes.  1-Individual and group therapy  2-Medication management for depression and anxiety: Medications reviewed with the patient and she stated no untoward effects.  -Loperamide 2mg  q8h loose stool -Zofran 4mg  q8h nausea/vomiting  3-Coping skills for depression, anxiety  4-Continue crisis stabilization and management  5-Address health issues--monitoring vital signs, stable  6-Treatment plan in progress to prevent relapse of depression and anxiety  Medical Decision Making Problem Points:  Established problem, stable/improving (1), Review of last therapy session (1) and Review of  psycho-social stressors (1) Data Points:  Review or order clinical lab tests (1) Review or order medicine tests (1) Review of medication regiment & side effects (2) Review of new medications or change in dosage (2)  I certify that inpatient services furnished can reasonably be expected to improve the patient's condition.   Elyse Jarvis Withrow, FNP-BC 09/26/2013, 10:13 AM  Requested internal medicine consult for increasing shortness of breath, chronic renal failure and a recent diarrhea. Reviewed the information documented and agree with the treatment plan.  Parke Simmers Toshi Ishii 09/26/2013 4:14 PM

## 2013-09-26 NOTE — Progress Notes (Signed)
D: Patient in her room on approach.  Patient states she had a bad day because she states she is over medicated.  Patient states the doctors and the nurses are telling conflicted stories.  Patient states, "I just want to go home."  Patient appears to be flat and depressed.  Patient states she believes in a higher power and state sshe is going to continue to read her bible.  Patient was given her scheduled medications tonight and appeared suspicious stating the dosage was too much and stating the pills were the wrong color.  Writer explained the dosages to her and the medications and patient appeared to understand and took the medications.  Patient denies SI/HI and denies AVH. A: Staff to monitor Q 15 mins for safety.  Encouragement and support offered.  Scheduled medications administered per orders.   R: Patient remains safe on the unit.  Patient attended group tonight.  Patient visible on the unit and interacting with peers.  Patient taking administered medications.
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
## 2013-09-26 NOTE — Progress Notes (Signed)
Case discussed, medication recommendations done to help stabilize patient

## 2013-09-26 NOTE — Progress Notes (Signed)
Adult Psychoeducational Group Note  Date:  09/26/2013 Time:  9:00 AM  Group Topic/Focus:  Morning Wellness Group  Participation Level:  Did Not Attend  Additional Comments: Patient was in bed during group.  Kathlen Brunswick 09/26/2013, 10:24 AM

## 2013-09-26 NOTE — Consult Note (Signed)
[facility] Medical Consultation  Jennifer Lawson EPP:[id number] DOB: 1960/12/26 DOA: 09/24/2013 PCP: Benito Mccreedy, MD   Requesting physician: Dr.Jonalaggada Date of consultation: 09/26/13 Reason for consultation: med mgt  Impression/Recommendations    CKD (chronic kidney disease) - baseline creatinine ranges from 3-4 - volume status appears euvolemic -due to reports of ongoing diarrhea will cut down lasix dose to 80mg  BID from 12mg  BID till this improves -repeat Bmet in am -change Diet to Renal -stop NSAIDs -Renal dose of medications, i cut down her Neurontin dose    Diarrhea  -?  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 Denies any dyspnea at this time, reports feeling a little weak and tired   Review of Systems:  12 system review negative except per HPI  Past Medical History  Diagnosis Date  . Mental disorder   . Depression   . Hypertension   . Overdose   . Tobacco use disorder 11/13/2012  . Complication of anesthesia     difficulty going to sleep  . Chronic kidney disease     06/11/13- not on dialysis  . Shortness of breath     lying down flat  . Diabetes mellitus without complication     denies  . PTSD (post-traumatic stress disorder)   . Asthma   . COPD (chronic obstructive pulmonary disease)   .  Heart murmur   . GERD (gastroesophageal reflux disease)   . Seizures     "passsed out"  . History of blood transfusion    Past Surgical History  Procedure Laterality Date  . Right knee replacement      she says it was last year.  . Esophagogastroduodenoscopy Left 11/14/2012    Procedure: ESOPHAGOGASTRODUODENOSCOPY (EGD);  Surgeon: [name], MD;  Location: [facility];  Service: Endoscopy;  Laterality: Left;  . Joint replacement Right     knee  . Parathyroidectomy    . Av fistula placement Right 06/12/2013    Procedure: ARTERIOVENOUS (AV) FISTULA CREATION; RIGHT  BASILIC VEIN TRANSPOSITION with Intraoperative ultrasound;  Surgeon: Mal [name], MD;  Location: [facility];  Service: Vascular;  Laterality: Right;   Social History:  reports that she has been smoking Cigarettes and Cigars.  She has a 80 pack-year smoking history. She has never used smokeless tobacco. She reports that she does not drink alcohol or use illicit drugs.  Allergies  Allergen Reactions  . Codeine Sulfate Anaphylaxis    Daughter called about having this allergy   . Haldol [Haloperidol Lactate] Shortness Of Breath  . Risperidone And Related Shortness Of Breath   Family History  Problem Relation Age of Onset  . Diabetes Mother   . Hyperlipidemia Mother   . Hypertension Mother   . Diabetes Father   . Hypertension Father   . Hyperlipidemia Father     Prior to Admission medications   Medication Sig Start Date End Date Taking?  Authorizing Provider  albuterol (PROAIR HFA) 108 (90 BASE) MCG/ACT inhaler Inhale 1 puff into the lungs every 4 (four) hours as needed for wheezing or shortness of breath.   Yes Historical Provider, MD  albuterol (PROVENTIL) (2.5 MG/3ML) 0.083% nebulizer solution Take 2.5 mg by nebulization 2 (two) times daily as needed for wheezing or shortness of breath.    Yes Historical Provider, MD  Aspirin-Acetaminophen-Caffeine (GOODY HEADACHE PO) Take 2 packets by mouth 2 (two) times daily as needed  (pain).   Yes Historical Provider, MD  divalproex (DEPAKOTE ER) 250 MG 24 hr tablet Take 250 mg by mouth 2 (two) times daily.   Yes Historical Provider, MD  Fluticasone-Salmeterol (ADVAIR) 100-50 MCG/DOSE AEPB Inhale 1 puff into the lungs 2 (two) times daily. 04/26/13  Yes Erline Hau, MD  furosemide (LASIX) 40 MG tablet Take 120 mg by mouth 2 (two) times daily.   Yes Historical Provider, MD  gabapentin (NEURONTIN) 600 MG tablet Take 600 mg by mouth at bedtime.  04/26/13  Yes Erline Hau, MD  hydrOXYzine (ATARAX/VISTARIL) 25 MG tablet Take 25 mg by mouth 3 (three) times daily as needed for anxiety (sleep).   Yes Historical Provider, MD  Melatonin ([employer] MELATONIN) 5 MG TABS Take 5 mg by mouth at bedtime.   Yes Historical Provider, MD  metoprolol succinate (TOPROL-XL) 100 MG 24 hr tablet Take 100 mg by mouth at bedtime. Take with or immediately following a meal.   Yes Historical Provider, MD  Multiple Vitamins-Minerals (CENTRUM SILVER PO) Take 1 tablet by mouth daily.   Yes Historical Provider, MD  omeprazole (PRILOSEC) 20 MG capsule Take 20 mg by mouth daily.   Yes Historical Provider, MD  orphenadrine (NORFLEX) 100 MG tablet Take 100 mg by mouth 2 (two) times daily as needed for muscle spasms.   Yes Historical Provider, MD  ziprasidone (GEODON) 60 MG capsule Take 60 mg by mouth 2 (two) times daily with a meal.    Yes Historical Provider, MD   Physical Exam: Blood pressure 121/79, pulse 92, temperature 98 F (36.7 C), temperature source Oral, resp. rate 17, height 5\' 2"  (1.575 m), weight 88.451 kg (195 lb), last menstrual period 05/09/2008, SpO2 93.00%. Filed Vitals:   09/26/13 0631  BP: 121/79  Pulse: 92  Temp:   Resp:      General: Alert, awake, resting in bed, oriented to self, place and partly to time ( date/day of week-wrong)  HEENT: PERRLA, EOMI  Cardiovascular: S1S2/RRR  Respiratory: CTAB  Abdomen: soft, Nt, BS present  Skin: no rashes or skin  breakdown  Musculoskeletal: no edema c/c, R arm AVF noted  Psychiatric: flat affect  Neurologic: moves all extremities, no localising signs  Labs on Admission:  Basic Metabolic Panel:  Recent Labs Lab 09/20/13 2245 09/21/13 1235  NA 139 138  K 5.1 4.8  CL 101 102  CO2 24 21  GLUCOSE 80 78  BUN 50* 51*  CREATININE 4.28* 4.06*  CALCIUM 11.1* 11.2*   Liver Function Tests:  Recent Labs Lab 09/20/13 2245 09/21/13 1235  AST 22 13  ALT 13 11  ALKPHOS 66 67  BILITOT <0.2* 0.3  PROT 7.9 8.0  ALBUMIN 3.7 3.7   No results found for this basename: LIPASE, AMYLASE,  in the last 168 hours No results found for this basename: AMMONIA,  in the last 168 hours CBC:  Recent Labs Lab 09/20/13 2245 09/21/13 1235  WBC 7.2 5.9  NEUTROABS 3.4 3.1  HGB 11.1* 11.2*  HCT 34.2* 34.5*  MCV 92.7 93.2  PLT 235 226   Cardiac Enzymes: No results found for this basename: CKTOTAL, CKMB, CKMBINDEX, TROPONINI,  in the last 168 hours BNP: No components found with this basename: POCBNP,  CBG:  Recent Labs Lab 09/21/13 0800 09/22/13 2135 09/23/13 2119  GLUCAP 83 124* 125*    Radiological Exams on Admission: No results found.  Time spent: 14min  Tanika Bracco [facility] Pager [phone]  If 7PM-7AM, please contact night-coverage [email or web address] Password [facility] 09/26/2013, 4:57 PM

## 2013-09-27 DIAGNOSIS — F29 Unspecified psychosis not due to a substance or known physiological condition: Secondary | ICD-10-CM

## 2013-09-27 DIAGNOSIS — F332 Major depressive disorder, recurrent severe without psychotic features: Secondary | ICD-10-CM

## 2013-09-27 LAB — CBC
HCT: 31.3 % — ABNORMAL LOW (ref 36.0–46.0)
Hemoglobin: 9.9 g/dL — ABNORMAL LOW (ref 12.0–15.0)
MCH: 29.9 pg (ref 26.0–34.0)
MCHC: 31.6 g/dL (ref 30.0–36.0)
MCV: 94.6 fL (ref 78.0–100.0)
Platelets: 223 10*3/uL (ref 150–400)
RBC: 3.31 MIL/uL — ABNORMAL LOW (ref 3.87–5.11)
RDW: 16.2 % — ABNORMAL HIGH (ref 11.5–15.5)
WBC: 6.7 10*3/uL (ref 4.0–10.5)

## 2013-09-27 LAB — BASIC METABOLIC PANEL
BUN: 57 mg/dL — ABNORMAL HIGH (ref 6–23)
CO2: 26 mEq/L (ref 19–32)
Calcium: 11 mg/dL — ABNORMAL HIGH (ref 8.4–10.5)
Chloride: 103 mEq/L (ref 96–112)
Creatinine, Ser: 3.72 mg/dL — ABNORMAL HIGH (ref 0.50–1.10)
GFR calc Af Amer: 15 mL/min — ABNORMAL LOW (ref >90)
GFR calc non Af Amer: 13 mL/min — ABNORMAL LOW (ref >90)
Glucose, Bld: 81 mg/dL (ref 70–99)
Potassium: 4.5 mEq/L (ref 3.7–5.3)
Sodium: 141 mEq/L (ref 137–147)

## 2013-09-27 NOTE — Tx Team (Signed)
Interdisciplinary Treatment Plan Update   Date Reviewed:  09/27/2013  Time Reviewed:  8:47 AM  Progress in Treatment:   Attending groups: Yes Participating in groups: Yes Taking medication as prescribed: Yes  Tolerating medication: Yes Family/Significant other contact made:  Yes, collateral contact with daughter. Patient understands diagnosis: Yes  Discussing patient identified problems/goals with staff: Yes Medical problems stabilized or resolved: Yes Denies suicidal/homicidal ideation: Yes Patient has not harmed self or others: Yes  For review of initial/current patient goals, please see plan of care.  Estimated Length of Stay:  3-4 days  Reasons for Continued Hospitalization:  Anxiety Depression Medication stabilization   New Problems/Goals identified:    Discharge Plan or Barriers:   Home with outpatient follow up with [facility]  Additional Comments:  Jennifer Lawson is a 53 y.o. female admitted voluntarily, emergently with increased symptoms of depression, safety concern due to lack of self care and patient was not taking her medications. She is a poor historian, not answering questions. She is thought blocked, and is noted to have abnormal oral movements around her mouth. She appears internally preoccupied. Daughter reports she has not been sleeping or 4 days, prior to coming to ED. She is suspicious of daughter. She is with a h/o PTSD, depression. She has multiple medical conditions including , chronic kidney disease, hypertension, diabetes, copd, asthma, and gerd. Patient daughter was unable to speak with staff yesterday afternoon due to being at work and feels she won't be able to care for her any longer. Daughter states that patient has been residing with her since January 2013. Since that time, daughter states that things have been "awful" due to patient having "episodes" and having to be brought to hospital every 2 to 3 months. Daughter states that patient has  received inpatient treatment twice at a facility in [facility] for medication management but cannot remember name of facility. Daughter states that both psychiatric hospitalizations were beneficial for her mother. On evaluation today, remains guarded and suspicious. Delayed and thought blocking.    Attendees:  Patient:  09/27/2013 8:47 AM   Signature: Mylinda Latina, MD 09/27/2013 8:47 AM  Signature:  Nena Polio, PA 09/27/2013 8:47 AM  Signature:  Eduard Roux, RN 09/27/2013 8:47 AM  Signature: Drake Leach, RN 09/27/2013 8:47 AM  Signature:  Chuck Hint, RN 09/27/2013 8:47 AM  Signature:  Joette Catching, LCSW 09/27/2013 8:47 AM  Signature:  Regan Lemming, LCSW 09/27/2013 8:47 AM  Signature:  Lucinda Dell, Care Coordinator [facility] 09/27/2013 8:47 AM  Signature:  09/27/2013 8:47 AM  Signature:  09/27/2013  8:47 AM  Signature:   Lars Pinks, RN [facility] 09/27/2013  8:47 AM  Signature: 09/27/2013  8:47 AM    Scribe for Treatment Team:   Joette Catching,  09/27/2013 8:47 AM

## 2013-09-27 NOTE — Progress Notes (Signed)
Pt asked for something to help her sleep.

## 2013-09-27 NOTE — Progress Notes (Addendum)
D: Pt denies SI/HI/AVH. Pt is pleasant and cooperative. Pt may be appearing to be losing her short term memory.Pt continues to ask the same questions over and over , like it is the first time asking. Pt appears somatic, and complains of issues from her stomach to her having diarrhea. Pt was not seen having any episodes of diarrhea. Pt stated she was given cheese and that was given her problems.   A: Pt was offered support and encouragement. Pt was given scheduled medications. Pt was encourage to attend groups. Q 15 minute checks were done for safety. Assesses Fistula +ve Bruit/thrill  R:Pt attends groups and interacts well with peers and staff. Pt is taking medication.Pt receptive to treatment and safety maintained on unit.

## 2013-09-27 NOTE — Consult Note (Signed)
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  Subjective  - persistently asking about going home, very unreliable historian, tells me that she vomited all of her food, nursing staff denies any emesis   Impression/Recommendations  CKD (chronic kidney disease) - baseline creatinine ranges from 3-4 - volume status appears euvolemic - due to reports of ongoing diarrhea will cut down lasix dose to 80mg  BID from 120mg  BID till this improves; this morning nurses told me no diarrheal episodes overnight nor this morning;  - Creatinine 4.28>>4.06>>3.72 - Renal Diet - avoid NSAIDs  Diarrhea  - Viral vs food poisoning, seems to have resolved.  - please send Cdiff PCR if she has further diarrheal episodes  - cut down diuretic dose till improved - imodium PRN for now   DM - CBGs stable, monitor  COPD (chronic obstructive pulmonary disease) - stable, no wheezing, nebs PRN - Tobacco cessation counseled  Depression and other Mental health issues per Psych   Physical Exam: Blood pressure 122/86, pulse 102, temperature 99.2 F (37.3 C), temperature source Oral, resp. rate 20, height 5\' 2"  (1.575 m), weight 88.451 kg (195 lb), last menstrual period 05/09/2008, SpO2 93.00%. Filed Vitals:   09/27/13 0802  BP: 122/86  Pulse: 102  Temp:   Resp:      General: Alert, awake, resting in bed, oriented to self, place and partly to time ( date/day of week-wrong)  HEENT: PERRLA, EOMI  Cardiovascular:  S1S2/RRR  Respiratory: CTAB  Abdomen: soft, Nt, BS present  Skin: no rashes or skin breakdown  Musculoskeletal: no edema c/c, R arm AVF noted  Psychiatric: flat affect  Neurologic: moves all extremities, no localising signs  Labs on Admission:  Basic Metabolic Panel:  Recent Labs Lab 09/20/13 2245 09/21/13 1235 09/27/13 0629  NA 139 138 141  K 5.1 4.8 4.5  CL 101 102 103  CO2 24 21 26   GLUCOSE 80 78 81  BUN 50* 51* 57*  CREATININE 4.28* 4.06* 3.72*  CALCIUM 11.1* 11.2* 11.0*   Liver Function Tests:  Recent Labs Lab 09/20/13 2245 09/21/13 1235  AST 22 13  ALT 13 11  ALKPHOS 66 67  BILITOT <0.2* 0.3  PROT 7.9 8.0  ALBUMIN 3.7 3.7   No results found for this basename: LIPASE, AMYLASE,  in the last 168 hours No results found for this basename: AMMONIA,  in the last 168 hours CBC:  Recent Labs Lab 09/20/13 2245 09/21/13 1235 09/27/13 0629  WBC 7.2 5.9 6.7  NEUTROABS 3.4 3.1  --   HGB 11.1* 11.2* 9.9*  HCT 34.2* 34.5* 31.3*  MCV 92.7 93.2 94.6  PLT 235 226 223   Cardiac Enzymes: No results found for this basename: CKTOTAL, CKMB, CKMBINDEX, TROPONINI,  in the last 168 hours BNP: No components found with this basename: POCBNP,  CBG:  Recent Labs Lab 09/21/13 0800 09/22/13 2135 09/23/13 2119  GLUCAP 83 124* 125*    Radiological Exams on Admission: No results found.  Time spent: 26min  Costin M Gherghe [facility] Pager [phone]  If 7PM-7AM, please contact night-coverage [email or web address] Password [facility] 09/27/2013, 10:33  AM

## 2013-09-27 NOTE — Progress Notes (Signed)
Adult Psychoeducational Group Note  Date:  09/27/2013 Time:  800p   Group Topic/Focus:  Wrap-Up Group:   The focus of this group is to help patients review their daily goal of treatment and discuss progress on daily workbooks.  Participation Level:  Active  Participation Quality:  Appropriate and Attentive  Affect:  Appropriate  Cognitive:  Appropriate  Insight: Good  Engagement in Group:  Engaged  Modes of Intervention:  Discussion  Additional Comments:  Laneshia stated her day was good , she was able to get some rest .  Colleen stated that she does have a natural support system with her family and that her main coping skill is to talk so that people will understand what she is going through and why. Charell stated she was in pain today (pain level 5)  [place] 09/27/2013, 10:42 PM

## 2013-09-27 NOTE — Progress Notes (Addendum)
D: Patient has labile affect and anxious mood; this evening the patient was tearful in her room; she verbalized that she misses her family and wants to go home. She reported on the self inventory sheet that she's sleeping well, good appetite and ability to pay attention and normal energy level. Patient rates depression "5" and feelings of hopelessness "1". She has not been participating in any of the groups. Writer observed patient pacing up and down the hallway. Today, the patient has been compliant with most medications of her regimen. No signs of diarrhea noted on this shift; however patient did c/o nausea.  A: Support and encouragement provided to patient. Scheduled medications administered per MD orders; she refused Geodon this morning. PRN Zofran given to patient for nausea. Maintain Q15 minute checks for safety.  R: Patient receptive. Denies SI/HI. Patient remains safe.

## 2013-09-27 NOTE — Progress Notes (Signed)
Patient ID: Jennifer Lawson, female   DOB: [birth year], 53 y.o.   MRN: [id number] [facility] MD Progress Note  09/27/2013 7:34 PM Jennifer Lawson  MRN:  [id number] Subjective:  Pt seen and chart reviewed.  Pt reports that her anxiety and depression are both at 5/10 just as they wre yesterday. Pt reports that her stomach pain and loose stools have slightly improved but have not yet resolved. Pt denies SI, HI, and AVH, contracts for safety. However, pt appears to be very sad and slightly confused during this assessment.    HPI: Jennifer Lawson is a 53 y.o. female admitted voluntarily, emergently with increased symptoms of depression, safety concern due to lack of self care and patient was not taking her medications. She is a poor historian, not answering questions. She is thought blocked, and is noted to have abnormal oral movements around her mouth. She appears internally preoccupied. Daughter reports she has not been sleeping or 4 days, prior to coming to ED. She is suspicious of daughter. She is with a h/o PTSD, depression. She has multiple medical conditions including , chronic kidney disease, hypertension, diabetes, copd, asthma, and gerd. Patient daughter was unable to speak with staff yesterday afternoon due to being at work and feels she won't be able to care for her any longer. Daughter states that patient has been residing with her since January 2013. Since that time, daughter states that things have been "awful" due to patient having "episodes" and having to be brought to hospital every 2 to 3 months. Daughter states that patient has received inpatient treatment twice at a facility in [facility] for medication management but cannot remember name of facility. Daughter states that both psychiatric hospitalizations were beneficial for her mother. On evaluation today, remains guarded and suspicious. Delayed and thought blocking.     Diagnosis:   DSM5: Depressive Disorders:  Major Depressive  Disorder - Severe (296.23) Total Time spent with patient: 25 minutes  Axis I: Major Depression, Recurrent severe Axis II: Deferred Axis III:  Past Medical History  Diagnosis Date  . Mental disorder   . Depression   . Hypertension   . Overdose   . Tobacco use disorder 11/13/2012  . Complication of anesthesia     difficulty going to sleep  . Chronic kidney disease     06/11/13- not on dialysis  . Shortness of breath     lying down flat  . Diabetes mellitus without complication     denies  . PTSD (post-traumatic stress disorder)   . Asthma   . COPD (chronic obstructive pulmonary disease)   . Heart murmur   . GERD (gastroesophageal reflux disease)   . Seizures     "passsed out"  . History of blood transfusion    Axis IV: other psychosocial or environmental problems and problems related to social environment Axis V: 41-50 serious symptoms  ADL's:  Impaired  Sleep: Fair  Appetite:  Fair  Suicidal Ideation:  Denies Homicidal Ideation:  Denies AEB (as evidenced by):  Psychiatric Specialty Exam: Physical Exam  Review of Systems  Constitutional: Negative.   HENT: Negative.   Eyes: Negative.   Respiratory: Negative.   Cardiovascular: Negative.   Gastrointestinal: Negative.   Genitourinary: Negative.   Musculoskeletal: Negative.   Skin: Negative.   Neurological: Negative.   Endo/Heme/Allergies: Negative.   Psychiatric/Behavioral: Positive for depression. The patient is nervous/anxious.     Blood pressure 118/82, pulse 78, temperature 99.2 F (37.3 C), temperature source Oral, resp. rate 20,  height 5' 2"  (1.575 m), weight 88.451 kg (195 lb), last menstrual period 05/09/2008, SpO2 93.00%.Body mass index is 35.66 kg/(m^2).  General Appearance: Casual  Eye Contact::  Good  Speech:  Clear and Coherent and Slow  Volume:  Decreased  Mood:  Anxious and Depressed  Affect:  Depressed  Thought Process:  Coherent and Loose  Orientation:  Full (Time, Place, and Person)   Thought Content:  WDL  Suicidal Thoughts:  No  Homicidal Thoughts:  No  Memory:  Immediate;   Fair Recent;   Fair Remote;   Fair  Judgement:  Fair  Insight:  Fair  Psychomotor Activity:  Decreased  Concentration:  Good  Recall:  [place]: Fair  Akathisia:  NA  Handed:    AIMS (if indicated):     Assets:  Desire for Improvement Resilience  Sleep:  Number of Hours: 5.5   Musculoskeletal: Strength & Muscle Tone: within normal limits Gait & Station: normal Patient leans: N/A  Current Medications: Current Facility-Administered Medications  Medication Dose Route Frequency Provider Last Rate Last Dose  . acetaminophen (TYLENOL) tablet 650 mg  650 mg Oral Q4H PRN Waylan Boga, NP   650 mg at 09/27/13 1520  . divalproex (DEPAKOTE ER) 24 hr tablet 500 mg  500 mg Oral QHS Durward Parcel, MD   500 mg at 09/26/13 2012  . fluticasone (FLONASE) 50 MCG/ACT nasal spray 2 spray  2 spray Each Nare Daily Waylan Boga, NP   2 spray at 09/27/13 0749  . furosemide (LASIX) tablet 80 mg  80 mg Oral BID Domenic Polite, MD   80 mg at 09/27/13 1700  . gabapentin (NEURONTIN) capsule 200 mg  200 mg Oral BID Domenic Polite, MD   200 mg at 09/27/13 1659  . hydrOXYzine (ATARAX/VISTARIL) tablet 50 mg  50 mg Oral QHS PRN Lurena Nida, NP      . ipratropium-albuterol (DUONEB) 0.5-2.5 (3) MG/3ML nebulizer solution 3 mL  3 mL Nebulization Q6H PRN Laverle Hobby, PA-C      . loperamide (IMODIUM) capsule 2 mg  2 mg Oral PRN Laverle Hobby, PA-C      . LORazepam (ATIVAN) tablet 1 mg  1 mg Oral Q8H PRN Waylan Boga, NP   1 mg at 09/25/13 2328  . magnesium hydroxide (MILK OF MAGNESIA) suspension 30 mL  30 mL Oral Daily PRN Waylan Boga, NP   30 mL at 09/24/13 2115  . metoprolol succinate (TOPROL-XL) 24 hr tablet 100 mg  100 mg Oral Daily Waylan Boga, NP   100 mg at 09/27/13 0748  . mometasone-formoterol (DULERA) 100-5 MCG/ACT inhaler 2 puff  2 puff Inhalation BID Waylan Boga, NP   2 puff at 09/27/13 0749  . nicotine (NICODERM CQ - dosed in mg/24 hours) patch 21 mg  21 mg Transdermal Daily Durward Parcel, MD   21 mg at 09/27/13 0749  . ondansetron (ZOFRAN-ODT) disintegrating tablet 4 mg  4 mg Oral Q8H PRN Waylan Boga, NP   4 mg at 09/27/13 1517  . ziprasidone (GEODON) capsule 60 mg  60 mg Oral Daily Nena Polio, PA-C   60 mg at 09/25/13 1744    Lab Results:  Results for orders placed during the hospital encounter of 09/24/13 (from the past 48 hour(s))  VALPROIC ACID LEVEL     Status: Abnormal   Collection Time    09/26/13  6:40 AM      Result Value Ref Range  Valproic Acid Lvl 13.5 (*) 50.0 - 100.0 ug/mL   Comment: Performed at [place] PANEL     Status: Abnormal   Collection Time    09/27/13  6:29 AM      Result Value Ref Range   Sodium 141  137 - 147 mEq/L   Potassium 4.5  3.7 - 5.3 mEq/L   Chloride 103  96 - 112 mEq/L   CO2 26  19 - 32 mEq/L   Glucose, Bld 81  70 - 99 mg/dL   BUN 57 (*) 6 - 23 mg/dL   Creatinine, Ser 3.72 (*) 0.50 - 1.10 mg/dL   Calcium 11.0 (*) 8.4 - 10.5 mg/dL   GFR calc non Af Amer 13 (*) >90 mL/min   GFR calc Af Amer 15 (*) >90 mL/min   Comment: (NOTE)     The eGFR has been calculated using the CKD EPI equation.     This calculation has not been validated in all clinical situations.     eGFR's persistently <90 mL/min signify possible Chronic Kidney     Disease.     Performed at [facility], [place]  CBC     Status: Abnormal   Collection Time    09/27/13  6:29 AM      Result Value Ref Range   WBC 6.7  4.0 - 10.5 K/uL   RBC 3.31 (*) 3.87 - 5.11 MIL/uL   Hemoglobin 9.9 (*) 12.0 - 15.0 g/dL   HCT 31.3 (*) 36.0 - 46.0 %   MCV 94.6  78.0 - 100.0 fL   MCH 29.9  26.0 - 34.0 pg   MCHC 31.6  30.0 - 36.0 g/dL   RDW 16.2 (*) 11.5 - 15.5 %   Platelets 223  150 - 400 K/uL   Comment: Performed at [facility]    Physical Findings: AIMS: Facial and Oral  Movements Muscles of Facial Expression: None, normal Lips and Perioral Area: None, normal Jaw: None, normal Tongue: None, normal,Extremity Movements Upper (arms, wrists, hands, fingers): None, normal Lower (legs, knees, ankles, toes): None, normal, Trunk Movements Neck, shoulders, hips: None, normal, Overall Severity Severity of abnormal movements (highest score from questions above): None, normal Incapacitation due to abnormal movements: None, normal Patient's awareness of abnormal movements (rate only patient's report): No Awareness, Dental Status Current problems with teeth and/or dentures?: No Does patient usually wear dentures?: Yes  CIWA:  CIWA-Ar Total: 3 COWS:  COWS Total Score: 3  Treatment Plan Summary: Daily contact with patient to assess and evaluate symptoms and progress in treatment Medication management  Plan: Review of chart, vital signs, medications, and notes.  1-Individual and group therapy  2-Medication management for depression and anxiety: Medications reviewed with the patient and she stated no untoward effects.  -Loperamide 50m q8h loose stool -Zofran [id number] nausea/vomiting  3-Coping skills for depression, anxiety  4-Continue crisis stabilization and management  5-Address health issues--monitoring vital signs, stable  6-Treatment plan in progress to prevent relapse of depression and anxiety  Medical Decision Making Problem Points:  Established problem, stable/improving (1), Review of last therapy session (1) and Review of psycho-social stressors (1) Data Points:  Review or order clinical lab tests (1) Review or order medicine tests (1) Review of medication regiment & side effects (2) Review of new medications or change in dosage (2)  I certify that inpatient services furnished can reasonably be expected to improve the patient's condition.   JoBenjamine MolaFNP-BC 09/27/2013,  2:34 PM  Reviewed the information documented and agree with the treatment  plan.  Parke Simmers Boden Stucky 09/28/2013 12:56 PM

## 2013-09-27 NOTE — BHH Group Notes (Signed)
[facility] LCSW Aftercare Discharge Planning Group Note   09/27/2013 10:25 AM    Participation Quality:  [place]  Mood/Affect:  Appropriate  Depression Rating:  0  Anxiety Rating:  0  Thoughts of Suicide:  No  Will you contract for safety?   NA  Current AVH:  No  Plan for Discharge/Comments:  Patient attended discharge planning group and actively participated in group.  She will follow up with [facility].  CSW provided all participants with daily workbook.   Transportation Means: Patient has transportation.   Supports:  Patient has a support system.   Lloyde Ludlam, Eulas Post

## 2013-09-27 NOTE — Progress Notes (Signed)
Patient ID: Jennifer Lawson, female   DOB: January 25, 1961, 53 y.o.   MRN: [id number] PER STATE REGULATIONS 482.30  THIS CHART WAS REVIEWED FOR MEDICAL NECESSITY WITH RESPECT TO THE PATIENT'S ADMISSION/ DURATION OF STAY.  NEXT REVIEW DATE: 10/01/2013  Chauncy Lean, RN, BSN CASE MANAGER

## 2013-09-28 LAB — BASIC METABOLIC PANEL
BUN: 51 mg/dL — ABNORMAL HIGH (ref 6–23)
CO2: 28 mEq/L (ref 19–32)
Calcium: 10.8 mg/dL — ABNORMAL HIGH (ref 8.4–10.5)
Chloride: 101 mEq/L (ref 96–112)
Creatinine, Ser: 3.6 mg/dL — ABNORMAL HIGH (ref 0.50–1.10)
GFR calc Af Amer: 16 mL/min — ABNORMAL LOW (ref >90)
GFR calc non Af Amer: 13 mL/min — ABNORMAL LOW (ref >90)
Glucose, Bld: 76 mg/dL (ref 70–99)
Potassium: 4.1 mEq/L (ref 3.7–5.3)
Sodium: 141 mEq/L (ref 137–147)

## 2013-09-28 NOTE — BHH Group Notes (Signed)
[place] Group Notes:  (Nursing/MHT/Case Management/Adjunct)  Date:  09/28/2013  Time:  1:03 PM  Type of Therapy:  Psychoeducational Skills  Participation Level:  Active  Participation Quality:  Appropriate  Affect:  Appropriate  Cognitive:  Appropriate  Insight:  Appropriate  Engagement in Group:  Engaged  Modes of Intervention:  Discussion  Summary of Progress/Problems: Pt did attend self inventory group, pt reported that she was negative SI/HI, no AH/VH noted. Pt rated her depression as a 0, and her helplessness/hopelessness as a 0.   Pt reported her goal as reach out to others before it to late.       Ethelene Closser Shanta Torrin Frein 09/28/2013, 1:03 PM

## 2013-09-28 NOTE — Consult Note (Addendum)
   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  Subjective  - persistently asking when can she go home. Denies nausea/vomiting/diarrhea.   Impression/Recommendations  CKD (chronic kidney disease) - baseline creatinine ranges from 3-4 - volume status appears euvolemic, continue Lasix at current dose - no diarrhea - Creatinine 4.28>>4.06>>3.72>>3.6. Recheck renal function on Monday.  - Renal Diet - avoid NSAIDs  Diarrhea  - Viral vs food poisoning, seems to have resolved.  - please send Cdiff PCR if she has further diarrheal episodes  - cut down diuretic dose till improved - imodium PRN for now   DM - CBGs stable, monitor  COPD (chronic obstructive pulmonary disease) - stable, no wheezing, nebs PRN - Tobacco cessation counseled  Depression and other Mental health issues per Psych  Will see again on Monday.   Physical Exam: Blood pressure 117/77, pulse 68, temperature 99.2 F (37.3 C), temperature source Oral, resp. rate 20, height 5\' 2"  (1.575 m), weight 88.451 kg (195 lb), last menstrual period 05/09/2008, SpO2 93.00%. Filed Vitals:   09/28/13 1100  BP: 117/77  Pulse: 68  Temp:   Resp:      General: Alert, awake  HEENT: PERRLA, EOMI  Cardiovascular: S1S2/RRR  Respiratory: CTAB  Abdomen: soft, Nt, BS present  Skin: no rashes or skin breakdown  Musculoskeletal: no edema c/c, R arm AVF noted  Psychiatric: flat affect  Neurologic: moves all extremities, no localising  signs  Labs on Admission:  Basic Metabolic Panel:  Recent Labs Lab 09/27/13 0629 09/28/13 0630  NA 141 141  K 4.5 4.1  CL 103 101  CO2 26 28  GLUCOSE 81 76  BUN 57* 51*  CREATININE 3.72* 3.60*  CALCIUM 11.0* 10.8*   Liver Function Tests: No results found for this basename: AST, ALT, ALKPHOS, BILITOT, PROT, ALBUMIN,  in the last 168 hours No results found for this basename: LIPASE, AMYLASE,  in the last 168 hours No results found for this basename: AMMONIA,  in the last 168 hours CBC:  Recent Labs Lab 09/27/13 0629  WBC 6.7  HGB 9.9*  HCT 31.3*  MCV 94.6  PLT 223   Cardiac Enzymes: No results found for this basename: CKTOTAL, CKMB, CKMBINDEX, TROPONINI,  in the last 168 hours BNP: No components found with this basename: POCBNP,  CBG:  Recent Labs Lab 09/22/13 2135 09/23/13 2119  GLUCAP 124* 125*    Radiological Exams on Admission: No results found.  Time spent: 25 min  [facility] Pager [phone]  If 7PM-7AM, please contact night-coverage [email or web address] Password [facility] 09/28/2013, 12:44 PM

## 2013-09-28 NOTE — Progress Notes (Signed)
Patient ID: Jennifer Lawson, female   DOB: 24-May-1960, 53 y.o.   MRN: [id number]  D: Pt has been very flat and depressed today, reporting concerns about the amount of lasix she was taking and her kidney function. Conrad NP made aware, he reported that he called in a consult on her. Pt also was requesting to be discharge, pt made aware that she has a discharge day of Monday.   Pt reported being negative SI/HI, no AH/VH noted. A: 15 min checks continued for patient safety. R: Pt safety maintained.

## 2013-09-28 NOTE — Progress Notes (Signed)
Patient ID: Jennifer Lawson, female   DOB: May 28, 1960, 53 y.o.   MRN: [id number] [facility] MD Progress Note  09/28/2013 10:18 AM Jennifer Lawson  MRN:  [id number] Subjective:  Pt seen and chart reviewed. Pt denies SI, HI, and AVH, contracts for safety. Pt reports that her anxiety and depression are "slightly better" and she is asking if she can go home. Pt was informed that she is not ready to go home and that we will re-valuate discharge plans on a daily basis; pt was informed that we would not consider discharging her before Monday. Pt still appears to be confused, but has improved since yesterday. [facility] consult continues daily and they report that she is improving from their perspective; no changes made to medications at this time.    HPI: Jennifer Lawson is a 53 y.o. female admitted voluntarily, emergently with increased symptoms of depression, safety concern due to lack of self care and patient was not taking her medications. She is a poor historian, not answering questions. She is thought blocked, and is noted to have abnormal oral movements around her mouth. She appears internally preoccupied. Daughter reports she has not been sleeping or 4 days, prior to coming to ED. She is suspicious of daughter. She is with a h/o PTSD, depression. She has multiple medical conditions including , chronic kidney disease, hypertension, diabetes, copd, asthma, and gerd. Patient daughter was unable to speak with staff yesterday afternoon due to being at work and feels she won't be able to care for her any longer. Daughter states that patient has been residing with her since January 2013. Since that time, daughter states that things have been "awful" due to patient having "episodes" and having to be brought to hospital every 2 to 3 months. Daughter states that patient has received inpatient treatment twice at a facility in [facility] for medication management but cannot remember name of facility. Daughter states that  both psychiatric hospitalizations were beneficial for her mother. On evaluation today, remains guarded and suspicious. Delayed and thought blocking.     Diagnosis:   DSM5: Depressive Disorders:  Major Depressive Disorder - Severe (296.23) Total Time spent with patient: 25 minutes  Axis I: Major Depression, Recurrent severe Axis II: Deferred Axis III:  Past Medical History  Diagnosis Date  . Mental disorder   . Depression   . Hypertension   . Overdose   . Tobacco use disorder 11/13/2012  . Complication of anesthesia     difficulty going to sleep  . Chronic kidney disease     06/11/13- not on dialysis  . Shortness of breath     lying down flat  . Diabetes mellitus without complication     denies  . PTSD (post-traumatic stress disorder)   . Asthma   . COPD (chronic obstructive pulmonary disease)   . Heart murmur   . GERD (gastroesophageal reflux disease)   . Seizures     "passsed out"  . History of blood transfusion    Axis IV: other psychosocial or environmental problems and problems related to social environment Axis V: 41-50 serious symptoms  ADL's:  Impaired  Sleep: Fair  Appetite:  Fair  Suicidal Ideation:  Denies Homicidal Ideation:  Denies AEB (as evidenced by):  Psychiatric Specialty Exam: Physical Exam  Review of Systems  Constitutional: Negative.   HENT: Negative.   Eyes: Negative.   Respiratory: Negative.   Cardiovascular: Negative.   Gastrointestinal: Negative.   Genitourinary: Negative.   Musculoskeletal: Negative.   Skin:  Negative.   Neurological: Negative.   Endo/Heme/Allergies: Negative.   Psychiatric/Behavioral: Positive for depression. The patient is nervous/anxious.     Blood pressure 123/79, pulse 81, temperature 99.2 F (37.3 C), temperature source Oral, resp. rate 20, height 5' 2"  (1.575 m), weight 88.451 kg (195 lb), last menstrual period 05/09/2008, SpO2 93.00%.Body mass index is 35.66 kg/(m^2).  General Appearance: Casual  Eye  Contact::  Good  Speech:  Clear and Coherent and Slow  Volume:  Decreased  Mood:  Anxious and Depressed  Affect:  Depressed  Thought Process:  Coherent and Loose  Orientation:  Full (Time, Place, and Person)  Thought Content:  WDL  Suicidal Thoughts:  No  Homicidal Thoughts:  No  Memory:  Immediate;   Fair Recent;   Fair Remote;   Fair  Judgement:  Fair  Insight:  Fair  Psychomotor Activity:  Decreased  Concentration:  Good  Recall:  [place]: Fair  Akathisia:  NA  Handed:    AIMS (if indicated):     Assets:  Desire for Improvement Resilience  Sleep:  Number of Hours: 5.5   Musculoskeletal: Strength & Muscle Tone: within normal limits Gait & Station: normal Patient leans: N/A  Current Medications: Current Facility-Administered Medications  Medication Dose Route Frequency Provider Last Rate Last Dose  . acetaminophen (TYLENOL) tablet 650 mg  650 mg Oral Q4H PRN Waylan Boga, NP   650 mg at 09/27/13 2151  . divalproex (DEPAKOTE ER) 24 hr tablet 500 mg  500 mg Oral QHS Durward Parcel, MD   500 mg at 09/27/13 2121  . fluticasone (FLONASE) 50 MCG/ACT nasal spray 2 spray  2 spray Each Nare Daily Waylan Boga, NP   2 spray at 09/28/13 0840  . furosemide (LASIX) tablet 80 mg  80 mg Oral BID Domenic Polite, MD   80 mg at 09/28/13 0839  . gabapentin (NEURONTIN) capsule 200 mg  200 mg Oral BID Domenic Polite, MD   200 mg at 09/28/13 9528  . hydrOXYzine (ATARAX/VISTARIL) tablet 50 mg  50 mg Oral QHS PRN Lurena Nida, NP   50 mg at 09/27/13 2122  . ipratropium-albuterol (DUONEB) 0.5-2.5 (3) MG/3ML nebulizer solution 3 mL  3 mL Nebulization Q6H PRN Laverle Hobby, PA-C      . loperamide (IMODIUM) capsule 2 mg  2 mg Oral PRN Laverle Hobby, PA-C      . LORazepam (ATIVAN) tablet 1 mg  1 mg Oral Q8H PRN Waylan Boga, NP   1 mg at 09/25/13 2328  . magnesium hydroxide (MILK OF MAGNESIA) suspension 30 mL  30 mL Oral Daily PRN Waylan Boga, NP   30  mL at 09/24/13 2115  . metoprolol succinate (TOPROL-XL) 24 hr tablet 100 mg  100 mg Oral Daily Waylan Boga, NP   100 mg at 09/28/13 0839  . mometasone-formoterol (DULERA) 100-5 MCG/ACT inhaler 2 puff  2 puff Inhalation BID Waylan Boga, NP   2 puff at 09/28/13 0840  . nicotine (NICODERM CQ - dosed in mg/24 hours) patch 21 mg  21 mg Transdermal Daily Durward Parcel, MD   21 mg at 09/28/13 0840  . ondansetron (ZOFRAN-ODT) disintegrating tablet 4 mg  4 mg Oral Q8H PRN Waylan Boga, NP   4 mg at 09/27/13 1517  . ziprasidone (GEODON) capsule 60 mg  60 mg Oral Daily Nena Polio, PA-C   60 mg at 09/28/13 4132    Lab Results:  Results for orders placed during  the hospital encounter of 09/24/13 (from the past 48 hour(s))  BASIC METABOLIC PANEL     Status: Abnormal   Collection Time    09/27/13  6:29 AM      Result Value Ref Range   Sodium 141  137 - 147 mEq/L   Potassium 4.5  3.7 - 5.3 mEq/L   Chloride 103  96 - 112 mEq/L   CO2 26  19 - 32 mEq/L   Glucose, Bld 81  70 - 99 mg/dL   BUN 57 (*) 6 - 23 mg/dL   Creatinine, Ser 3.72 (*) 0.50 - 1.10 mg/dL   Calcium 11.0 (*) 8.4 - 10.5 mg/dL   GFR calc non Af Amer 13 (*) >90 mL/min   GFR calc Af Amer 15 (*) >90 mL/min   Comment: (NOTE)     The eGFR has been calculated using the CKD EPI equation.     This calculation has not been validated in all clinical situations.     eGFR's persistently <90 mL/min signify possible Chronic Kidney     Disease.     Performed at [facility]  CBC     Status: Abnormal   Collection Time    09/27/13  6:29 AM      Result Value Ref Range   WBC 6.7  4.0 - 10.5 K/uL   RBC 3.31 (*) 3.87 - 5.11 MIL/uL   Hemoglobin 9.9 (*) 12.0 - 15.0 g/dL   HCT 31.3 (*) 36.0 - 46.0 %   MCV 94.6  78.0 - 100.0 fL   MCH 29.9  26.0 - 34.0 pg   MCHC 31.6  30.0 - 36.0 g/dL   RDW 16.2 (*) 11.5 - 15.5 %   Platelets 223  150 - 400 K/uL   Comment: Performed at [facility]  PANEL     Status: Abnormal   Collection Time    09/28/13  6:30 AM      Result Value Ref Range   Sodium 141  137 - 147 mEq/L   Potassium 4.1  3.7 - 5.3 mEq/L   Chloride 101  96 - 112 mEq/L   CO2 28  19 - 32 mEq/L   Glucose, Bld 76  70 - 99 mg/dL   BUN 51 (*) 6 - 23 mg/dL   Creatinine, Ser 3.60 (*) 0.50 - 1.10 mg/dL   Calcium 10.8 (*) 8.4 - 10.5 mg/dL   GFR calc non Af Amer 13 (*) >90 mL/min   GFR calc Af Amer 16 (*) >90 mL/min   Comment: (NOTE)     The eGFR has been calculated using the CKD EPI equation.     This calculation has not been validated in all clinical situations.     eGFR's persistently <90 mL/min signify possible Chronic Kidney     Disease.     Performed at [facility]    Physical Findings: AIMS: Facial and Oral Movements Muscles of Facial Expression: None, normal Lips and Perioral Area: None, normal Jaw: None, normal Tongue: None, normal,Extremity Movements Upper (arms, wrists, hands, fingers): None, normal Lower (legs, knees, ankles, toes): None, normal, Trunk Movements Neck, shoulders, hips: None, normal, Overall Severity Severity of abnormal movements (highest score from questions above): None, normal Incapacitation due to abnormal movements: None, normal Patient's awareness of abnormal movements (rate only patient's report): No Awareness, Dental Status Current problems with teeth and/or dentures?: No Does patient usually wear dentures?: Yes  CIWA:  CIWA-Ar Total: 3 COWS:  COWS Total Score: 3  Treatment Plan Summary: Daily contact with patient to assess and evaluate symptoms and progress in treatment Medication management  Plan: Review of chart, vital signs, medications, and notes.  1-Individual and group therapy  2-Medication management for depression and anxiety: Medications reviewed with the patient and she stated no untoward effects.  -Loperamide 62m q8h loose stool -Zofran [id number] nausea/vomiting  3-Coping skills for  depression, anxiety  4-Continue crisis stabilization and management  5-Address health issues--monitoring vital signs, stable  6-Treatment plan in progress to prevent relapse of depression and anxiety  Medical Decision Making Problem Points:  Established problem, stable/improving (1), Review of last therapy session (1) and Review of psycho-social stressors (1) Data Points:  Review or order clinical lab tests (1) Review or order medicine tests (1) Review of medication regiment & side effects (2) Review of new medications or change in dosage (2)  I certify that inpatient services furnished can reasonably be expected to improve the patient's condition.   JoElyse Jarvisithrow, FNP-BC 09/28/2013, 10:18 AM Agree with assessment and plan IrGeralyn Flash. LuSabra HeckM.D.

## 2013-09-28 NOTE — [facility] Group Notes (Signed)
[place] Group Notes:  (Clinical Social Work)  09/28/2013   1:15-2:15PM  Summary of Progress/Problems:   The main focus of today's process group was for the patient to identify ways in which they have sabotaged their own mental health wellness/recovery.  Motivational interviewing and a handout were used to explore the benefits and costs of their self-sabotaging behavior as well as the benefits and costs of changing this behavior.  The Stages of Change were explained to the group using a handout, and patients identified where they are with regard to changing self-defeating behaviors.  The patient expressed she self-sabotages with smoking.  She was called out to see doctor.  When she returned, she merely listened.  Type of Therapy:  Process Group  Participation Level:  Active  Participation Quality:  Attentive  Affect:  Flat  Cognitive:  Appropriate  Insight:  Improving  Engagement in Therapy:  Engaged  Modes of Intervention:  Education, Motivational Interviewing   Selmer Dominion, LCSW 09/28/2013, 4:00pm

## 2013-09-28 NOTE — [facility] Group Notes (Signed)
[place] Group Notes:  (Nursing/MHT/Case Management/Adjunct)  Date:  09/28/2013  Time:  1:25 PM  Type of Therapy:  Psychoeducational Skills  Participation Level:  Active  Participation Quality:  Appropriate  Affect:  Appropriate  Cognitive:  Appropriate  Insight:  Appropriate  Engagement in Group:  Engaged  Modes of Intervention:  Problem-solving  Summary of Progress/Problems: Pt attended healthy coping skills group and was able to engage.   Jennifer Lawson Jennifer Lawson 09/28/2013, 1:25 PM

## 2013-09-29 DIAGNOSIS — F333 Major depressive disorder, recurrent, severe with psychotic symptoms: Principal | ICD-10-CM

## 2013-09-29 MED ORDER — ZIPRASIDONE HCL 60 MG PO CAPS
60.0000 mg | ORAL_CAPSULE | Freq: Two times a day (BID) | ORAL | Status: DC
  Administered 2013-09-29 – 2013-09-30 (×2): 60 mg via ORAL
  Filled 2013-09-29 (×6): qty 1

## 2013-09-29 NOTE — Progress Notes (Signed)
Adult Psychoeducational Group Note  Date:  09/29/2013 Time:  12:51 AM  Group Topic/Focus:  Wrap-Up Group:   The focus of this group is to help patients review their daily goal of treatment and discuss progress on daily workbooks.  Participation Level:  Active  Participation Quality:  Appropriate  Affect:  Appropriate  Cognitive:  Appropriate  Insight: Appropriate  Engagement in Group:  Engaged  Modes of Intervention:  Discussion  Additional Comments: The patient expressed that learned in group to be thankful for GOD.The patient also said that she was thankful for  [facility], family and people for healthy resources.  Jennifer Lawson 09/29/2013, 12:51 AM

## 2013-09-29 NOTE — [facility] Group Notes (Signed)
[place] Group Notes:  (Nursing/MHT/Case Management/Adjunct)  Date:  09/29/2013  Time:  1:30 PM   Type of Therapy:  Psychoeducational Skills  Participation Level:  Active  Participation Quality:  Appropriate  Affect:  Appropriate  Cognitive:  Appropriate  Insight:  Appropriate  Engagement in Group:  Engaged  Modes of Intervention:  Discussion  Summary of Progress/Problems: Pt did attend spirituality and self inventory group, pt reported that she was negative SI/HI, no AH/VH noted. Pt rated her depression as a 0, and her helplessness/hopelessness as a 0.     Jennifer Lawson Shanta Kyl Givler 09/29/2013, 1:30 PM

## 2013-09-29 NOTE — Progress Notes (Signed)
Patient ID: Jennifer Lawson, female   DOB: 1960/06/30, 53 y.o.   MRN: [id number] [facility] MD Progress Note  09/29/2013 4:24 PM SHANDELLE BORRELLI  MRN:  [id number] Subjective:  Patient states "I've been doing better. I think I should go home. I'm not sure you are looking at the right record. Is my social security number there? That is not true that I was not taking medications or caring for myself."  Objective:  Patient is visible on the unit. She is attending the scheduled groups. Her thought processes are delayed and also slightly disorganized. Laney is extremely suspicious of provider. Patient has numerous questions about her medications. Her thought is opened with appropriate verification. However, the patient continued to question provider about "Is that really my chart.?" Patient is also extremely worried about her kidney function. She was told that it appears to be improving but continued to fixate on "Can you tell me if I will need dialysis?" She becomes upset when information from the H&P is reviewed adamantly refusing that she ever was not compliant with her medications. Notes in epic reveal that the patient was also suspicious of her daughter. However, today she denies any of these problems. She appears to be a poor historian. Patient makes frequent requests to go home with no insight into her reasons for admission. When asked why she came to the hospital replied "I was just having a bad day and someone noticed." Other staff also report that patient is consistently asking to go home.   Diagnosis:   DSM5: Depressive Disorders:  Major Depressive Disorder - Severe (296.23) Total Time spent with patient: 25 minutes  Axis I: Major Depression, Recurrent severe with psychotic features.  Axis II: Deferred Axis III:  Past Medical History  Diagnosis Date  . Mental disorder   . Depression   . Hypertension   . Overdose   . Tobacco use disorder 11/13/2012  . Complication of anesthesia      difficulty going to sleep  . Chronic kidney disease     06/11/13- not on dialysis  . Shortness of breath     lying down flat  . Diabetes mellitus without complication     denies  . PTSD (post-traumatic stress disorder)   . Asthma   . COPD (chronic obstructive pulmonary disease)   . Heart murmur   . GERD (gastroesophageal reflux disease)   . Seizures     "passsed out"  . History of blood transfusion    Axis IV: other psychosocial or environmental problems and problems related to social environment Axis V: 41-50 serious symptoms  ADL's:  Impaired  Sleep: Fair  Appetite:  Fair  Suicidal Ideation:  Denies Homicidal Ideation:  Denies AEB (as evidenced by):  Psychiatric Specialty Exam: Physical Exam  Review of Systems  Constitutional: Negative.   HENT: Negative.   Eyes: Negative.   Respiratory: Negative.   Cardiovascular: Negative.   Gastrointestinal: Negative.   Genitourinary: Negative.   Musculoskeletal: Negative.   Skin: Negative.   Neurological: Positive for dizziness.  Endo/Heme/Allergies: Negative.   Psychiatric/Behavioral: Positive for depression. The patient is nervous/anxious.     Blood pressure 109/68, pulse 73, temperature 97.1 F (36.2 C), temperature source Oral, resp. rate 16, height 5' 2" (1.575 m), weight 88.451 kg (195 lb), last menstrual period 05/09/2008, SpO2 93.00%.Body mass index is 35.66 kg/(m^2).  General Appearance: Casual  Eye Contact::  Good  Speech:  Clear and Coherent and Slow  Volume:  Decreased  Mood:  Anxious and Depressed  Affect:  Depressed  Thought Process:  Coherent and Loose  Orientation:  Full (Time, Place, and Person)  Thought Content:  WDL  Suicidal Thoughts:  No  Homicidal Thoughts:  No  Memory:  Immediate;   Fair Recent;   Fair Remote;   Fair  Judgement:  Fair  Insight:  Fair  Psychomotor Activity:  Decreased  Concentration:  Good  Recall:  [place]: Fair  Akathisia:  NA  Handed:     AIMS (if indicated):     Assets:  Desire for Improvement Resilience  Sleep:  Number of Hours: 5.75   Musculoskeletal: Strength & Muscle Tone: within normal limits Gait & Station: normal Patient leans: N/A  Current Medications: Current Facility-Administered Medications  Medication Dose Route Frequency Provider Last Rate Last Dose  . acetaminophen (TYLENOL) tablet 650 mg  650 mg Oral Q4H PRN Waylan Boga, NP   650 mg at 09/29/13 1551  . divalproex (DEPAKOTE ER) 24 hr tablet 500 mg  500 mg Oral QHS Durward Parcel, MD   500 mg at 09/28/13 1948  . fluticasone (FLONASE) 50 MCG/ACT nasal spray 2 spray  2 spray Each Nare Daily Waylan Boga, NP   2 spray at 09/29/13 0807  . furosemide (LASIX) tablet 80 mg  80 mg Oral BID Domenic Polite, MD   80 mg at 09/29/13 6948  . gabapentin (NEURONTIN) capsule 200 mg  200 mg Oral BID Domenic Polite, MD   200 mg at 09/29/13 5462  . hydrOXYzine (ATARAX/VISTARIL) tablet 50 mg  50 mg Oral QHS PRN Lurena Nida, NP   50 mg at 09/27/13 2122  . ipratropium-albuterol (DUONEB) 0.5-2.5 (3) MG/3ML nebulizer solution 3 mL  3 mL Nebulization Q6H PRN Laverle Hobby, PA-C      . loperamide (IMODIUM) capsule 2 mg  2 mg Oral PRN Laverle Hobby, PA-C      . LORazepam (ATIVAN) tablet 1 mg  1 mg Oral Q8H PRN Waylan Boga, NP   1 mg at 09/25/13 2328  . magnesium hydroxide (MILK OF MAGNESIA) suspension 30 mL  30 mL Oral Daily PRN Waylan Boga, NP   30 mL at 09/24/13 2115  . metoprolol succinate (TOPROL-XL) 24 hr tablet 100 mg  100 mg Oral Daily Waylan Boga, NP   100 mg at 09/29/13 0808  . mometasone-formoterol (DULERA) 100-5 MCG/ACT inhaler 2 puff  2 puff Inhalation BID Waylan Boga, NP   2 puff at 09/29/13 0807  . nicotine (NICODERM CQ - dosed in mg/24 hours) patch 21 mg  21 mg Transdermal Daily Durward Parcel, MD   21 mg at 09/29/13 0807  . ondansetron (ZOFRAN-ODT) disintegrating tablet 4 mg  4 mg Oral Q8H PRN Waylan Boga, NP   4 mg at 09/28/13 1214  .  ziprasidone (GEODON) capsule 60 mg  60 mg Oral BID WC Elmarie Shiley, NP        Lab Results:  Results for orders placed during the hospital encounter of 09/24/13 (from the past 48 hour(s))  BASIC METABOLIC PANEL     Status: Abnormal   Collection Time    09/28/13  6:30 AM      Result Value Ref Range   Sodium 141  137 - 147 mEq/L   Potassium 4.1  3.7 - 5.3 mEq/L   Chloride 101  96 - 112 mEq/L   CO2 28  19 - 32 mEq/L   Glucose, Bld 76  70 - 99 mg/dL   BUN 51 (*)  6 - 23 mg/dL   Creatinine, Ser 3.60 (*) 0.50 - 1.10 mg/dL   Calcium 10.8 (*) 8.4 - 10.5 mg/dL   GFR calc non Af Amer 13 (*) >90 mL/min   GFR calc Af Amer 16 (*) >90 mL/min   Comment: (NOTE)     The eGFR has been calculated using the CKD EPI equation.     This calculation has not been validated in all clinical situations.     eGFR's persistently <90 mL/min signify possible Chronic Kidney     Disease.     Performed at [facility]    Physical Findings: AIMS: Facial and Oral Movements Muscles of Facial Expression: None, normal Lips and Perioral Area: None, normal Jaw: None, normal Tongue: None, normal,Extremity Movements Upper (arms, wrists, hands, fingers): None, normal Lower (legs, knees, ankles, toes): None, normal, Trunk Movements Neck, shoulders, hips: None, normal, Overall Severity Severity of abnormal movements (highest score from questions above): None, normal Incapacitation due to abnormal movements: None, normal Patient's awareness of abnormal movements (rate only patient's report): No Awareness, Dental Status Current problems with teeth and/or dentures?: No Does patient usually wear dentures?: Yes  CIWA:  CIWA-Ar Total: 3 COWS:  COWS Total Score: 3  Treatment Plan Summary: Daily contact with patient to assess and evaluate symptoms and progress in treatment Medication management  Plan: Review of chart, vital signs, medications, and notes.  1-Individual and group therapy  2-Medication  management for depression and anxiety: Medications reviewed with the patient and she stated no untoward effects. -Increase Geodon to 60 mg BID for apparent symptoms of paranoia  -Loperamide 53m q8h loose stool -Zofran [id number] nausea/vomiting 3-Coping skills for depression, anxiety  4-Continue crisis stabilization and management  5-Address health issues--monitoring vital signs, stable  6-Treatment plan in progress to prevent relapse of depression and anxiety 7-Internal medicine is following patient for CKD issues.    Medical Decision Making Problem Points:  Established problem, stable/improving (1), Review of last therapy session (1) and Review of psycho-social stressors (1) Data Points:  Review or order clinical lab tests (1) Review or order medicine tests (1) Review of medication regiment & side effects (2) Review of new medications or change in dosage (2)  I certify that inpatient services furnished can reasonably be expected to improve the patient's condition.   LaElmarie ShileyNP-C 09/29/2013, 16:26 Agree with assessment and plan IrWoodroe ChenLuSabra HeckM.D.

## 2013-09-29 NOTE — Progress Notes (Signed)
D.  Pt pleasant on approach, denies complaints other than that she would like something for sleep tonight.  Positive for evening wrap up group with appropriate participation.  Denies SI/HI/hallucinations at this time.  A.  Support and encouragement offered, medications given as ordered for sleep  R.  Pt remains safe on unit, will continue to monitor.

## 2013-09-29 NOTE — B[facility] Group Notes (Signed)
[place] Group Notes:  (Clinical Social Work)  09/29/2013   1:15-2:15PM  Summary of Progress/Problems:  The main focus of today's process group was to identify the patient's current support system and encourage patients about other supports that can be put in place.  The picture on workbook was used to discuss why additional supports are needed.  An emphasis was placed on using counselor, doctor, therapy groups, 12-step groups, and problem-specific support groups to expand supports.   There was also an extensive discussion about what constitutes a healthy support versus an unhealthy support.  Finally, a scaling question (1 = least to 10 = most) was used to determination the patient's willingness to do something different upon discharge that will build supports.  The patient expressed full comprehension of the concepts presented, and agreed that there is a need to add more supports.   Jennifer Lawson stated the current support includes her daughter and other family members, and the willingness to focus on building additional support is "10."  Type of Therapy:  Process Group with Motivational Interviewing  Participation Level:  Active  Participation Quality:  Attentive and Sharing  Affect:  Blunted  Cognitive:  Confused   Insight:  Improving  Engagement in Therapy:  Improving  Modes of Intervention:  Education,  Support and [employer], LCSW 09/29/2013, 4:00pm

## 2013-09-29 NOTE — Progress Notes (Signed)
Patient ID: Jennifer Lawson, female   DOB: [birth year], 53 y.o.   MRN: [id number]  D: Pt has been very intrusive today on the unit, she has continue to request discharge and has continued to question her care here at [facility]. Pt reported that she was getting too much medication, however has been on the same medication for several days. Pt reports that she is fine and that she just needs to be discharged. Pt reported being negative SI/HI, no AH/VH noted. A: 15 min checks continued for patient safety. R: Pt safety maintained.

## 2013-09-29 NOTE — Progress Notes (Signed)
D: Pt denies SI/HI/AVH. Pt is pleasant and cooperative. Pt continues to be somatic. Pt stated she was having back and knee pain and wanted something like icy hot, writer told pt we generally use ben-gay. Pt did not mention anything about that the rest of the night.   A: Pt was offered support and encouragement. Pt was given scheduled medications. Pt was encourage to attend groups. Q 15 minute checks were done for safety.   R:Pt attends groups and interacts well with peers and staff. Pt is taking medication.Pt receptive to treatment and safety maintained on unit.

## 2013-09-29 NOTE — [facility] Group Notes (Signed)
[facility] Group Notes:  (Nursing/MHT/Case Management/Adjunct)  Date:  09/29/2013  Time:  1:49 PM  Type of Therapy:  Psychoeducational Skills  Participation Level:  Active  Participation Quality:  Appropriate  Affect:  Appropriate  Cognitive:  Appropriate  Insight:  Appropriate  Engagement in Group:  Engaged  Modes of Intervention:  Education  Summary of Progress/Problems: Pt attended healthy support systems and self esteem group, and was able to engage.   Jestine Bicknell Shanta Kate Larock 09/29/2013, 1:49 PM

## 2013-09-29 NOTE — Progress Notes (Signed)
[facility] Group Notes:  (Nursing/MHT/Case Management/Adjunct)  Date:  09/29/2013  Time:  10:11 PM  Type of Therapy:  Group Therapy  Participation Level:  Minimal  Participation Quality:  Sharing  Affect:  Appropriate  Cognitive:  Appropriate  Insight:  Limited  Engagement in Group:  Engaged  Modes of Intervention:  Discussion  Summary of Progress/Problems: Patient says that she had an ok day but she didn't feel too good. The positive thing that happened to her, she says that she is still alive and that she is able to talk to more people.  Jennifer Lawson 09/29/2013, 10:11 PM

## 2013-09-30 LAB — BASIC METABOLIC PANEL
BUN: 47 mg/dL — ABNORMAL HIGH (ref 6–23)
CO2: 31 mEq/L (ref 19–32)
Calcium: 10.7 mg/dL — ABNORMAL HIGH (ref 8.4–10.5)
Chloride: 104 mEq/L (ref 96–112)
Creatinine, Ser: 3.64 mg/dL — ABNORMAL HIGH (ref 0.50–1.10)
GFR calc Af Amer: 15 mL/min — ABNORMAL LOW (ref >90)
GFR calc non Af Amer: 13 mL/min — ABNORMAL LOW (ref >90)
Glucose, Bld: 83 mg/dL (ref 70–99)
Potassium: 4.7 mEq/L (ref 3.7–5.3)
Sodium: 143 mEq/L (ref 137–147)

## 2013-09-30 MED ORDER — FUROSEMIDE 20 MG PO TABS: 100.0000 mg | ORAL_TABLET | Freq: Two times a day (BID) | ORAL | Status: DC

## 2013-09-30 MED ORDER — GABAPENTIN 100 MG PO CAPS: 200.0000 mg | ORAL_CAPSULE | Freq: Two times a day (BID) | ORAL | Status: DC

## 2013-09-30 MED ORDER — ZIPRASIDONE HCL 60 MG PO CAPS: 60.0000 mg | ORAL_CAPSULE | Freq: Two times a day (BID) | ORAL | Status: DC

## 2013-09-30 MED ORDER — DIVALPROEX SODIUM ER 500 MG PO TB24: 500.0000 mg | ORAL_TABLET | Freq: Every day | ORAL | Status: DC

## 2013-09-30 MED ORDER — FLUTICASONE-SALMETEROL 100-50 MCG/DOSE IN AEPB: 1.0000 | INHALATION_SPRAY | Freq: Two times a day (BID) | RESPIRATORY_TRACT | Status: DC

## 2013-09-30 MED ORDER — METOPROLOL SUCCINATE ER 100 MG PO TB24: 100.0000 mg | ORAL_TABLET | Freq: Every day | ORAL | Status: DC

## 2013-09-30 MED ORDER — HYDROXYZINE HCL 50 MG PO TABS: 50.0000 mg | ORAL_TABLET | Freq: Every evening | ORAL | Status: DC | PRN

## 2013-09-30 MED ORDER — FUROSEMIDE 40 MG PO TABS
100.0000 mg | ORAL_TABLET | Freq: Two times a day (BID) | ORAL | Status: DC
  Filled 2013-09-30 (×4): qty 5

## 2013-09-30 NOTE — Progress Notes (Signed)
CSW spoke to patient in regard to discharge plans. Patient reported that one of her daughters should be able to transport at her time of discharge. Patient reported that she spoke to her daughter yesterday. CSW telephoned both daughters listed in chart Marzetta Board [phone] and Marcene Brawn [phone]) but was unsuccessful at reaching them. CSW left voicemail requesting daughters to contact RN station as soon as possible.   Boyce Medici., MSW, LCSW Clinical Social Worker Phone: [phone] Fax: [phone]

## 2013-09-30 NOTE — Progress Notes (Signed)
Patient ID: Jennifer Lawson, female   DOB: [birth year], 53 y.o.   MRN: [id number]  Discharge Summary Note: Belongings returned to patient at time of discharge. Discharge instructions and medications were reviewed with patient. Patient verbalized understanding of both medications and discharge instructions. Q15 minute safety checks maintained until discharge. No distress noted upon discharge.

## 2013-09-30 NOTE — Discharge Summary (Signed)
Physician Discharge Summary Note  Patient:  Jennifer Lawson is an 53 y.o., female MRN:  [id number] DOB:  [birth year] Patient phone:  [phone] (home)  Patient address:   [address] [place] [address],  Total Time spent with patient: 30 minutes  Date of Admission:  09/24/2013 Date of Discharge: 09/30/2013  Reason for Admission:  Worsening depression  Discharge Diagnoses: Active Problems:   CKD (chronic kidney disease)   Uncontrolled hypertension   Psychosis   COPD (chronic obstructive pulmonary disease)   MDD (major depressive disorder), recurrent, severe, with psychosis   Psychiatric Specialty Exam:   Please see d/c SRA Physical Exam  ROS  Blood pressure 120/85, pulse 80, temperature 98.5 F (36.9 C), temperature source Oral, resp. rate 20, height 5' 2"  (1.575 m), weight 88.451 kg (195 lb), last menstrual period 05/09/2008, SpO2 93.00%.Body mass index is 35.66 kg/(m^2).  Past Psychiatric History: Diagnosis:  Hospitalizations:  Outpatient Care:  Substance Abuse Care:  Self-Mutilation:  Suicidal Attempts:  Violent Behaviors:  DSM5: Discharge Diagnoses:  AXIS I: Bipolar, Depressed  AXIS II: Deferred  AXIS III:  Past Medical History   Diagnosis  Date   .  Mental disorder    .  Depression    .  Hypertension    .  Overdose    .  Tobacco use disorder  11/13/2012   .  Complication of anesthesia      difficulty going to sleep   .  Chronic kidney disease      06/11/13- not on dialysis   .  Shortness of breath      lying down flat   .  Diabetes mellitus without complication      denies   .  PTSD (post-traumatic stress disorder)    .  Asthma    .  COPD (chronic obstructive pulmonary disease)    .  Heart murmur    .  GERD (gastroesophageal reflux disease)    .  Seizures      "passsed out"   .  History of blood transfusion     AXIS IV: other psychosocial or environmental problems, problems related to social environment and problems with primary support group   AXIS V: 61-70 mild symptoms   Level of Care:  OP  Hospital Course:  Jennifer Lawson is a 53 y.o. female admitted voluntarily, emergently with increased symptoms of depression, safety concern due to lack of self care and patient was not taking her medications. She is a poor historian, not answering questions. She is thought blocked, and is noted to have abnormal oral movements around her mouth. She appears internally preoccupied. Daughter reports she has not been sleeping or 4 days, prior to coming to ED. She is suspicious of daughter. She is with a h/o PTSD, depression.          Jennifer Lawson was admitted to the adult unit where she was evaluated and her symptoms were identified. Medication management was discussed and implemented. She was encouraged to participate in unit programming. Medical problems were identified and treated appropriately. Home medication was restarted as needed.                      She was evaluated each day by a clinical provider to ascertain the patient's response to treatment.  Improvement was noted by the patient's report of decreasing symptoms, improved sleep and appetite, affect, medication tolerance, behavior, and participation in unit programming.  The patient was asked each day  to complete a self inventory noting mood, mental status, pain, new symptoms, anxiety and concerns.         She responded well to medication and being in a therapeutic and supportive environment. Positive and appropriate behavior was noted and the patient was motivated for recovery.  She worked closely with the treatment team and case manager to develop a discharge plan with appropriate goals. Coping skills, problem solving as well as relaxation therapies were also part of the unit programming.         By the day of discharge she was in much improved condition than upon admission.  Symptoms were reported as significantly decreased or resolved completely.  The patient denied SI/HI and voiced  no AVH. She was motivated to continue taking medication with a goal of continued improvement in mental health.          Jennifer Lawson was discharged home with a plan to follow up as noted below.    Consults:  psychiatry and Internal Medicine  Significant Diagnostic Studies:  None  Discharge Vitals:   Blood pressure 120/85, pulse 80, temperature 98.5 F (36.9 C), temperature source Oral, resp. rate 20, height 5' 2"  (1.575 m), weight 88.451 kg (195 lb), last menstrual period 05/09/2008, SpO2 93.00%. Body mass index is 35.66 kg/(m^2). Lab Results:   Results for orders placed during the hospital encounter of 09/24/13 (from the past 72 hour(s))  BASIC METABOLIC PANEL     Status: Abnormal   Collection Time    09/28/13  6:30 AM      Result Value Ref Range   Sodium 141  137 - 147 mEq/L   Potassium 4.1  3.7 - 5.3 mEq/L   Chloride 101  96 - 112 mEq/L   CO2 28  19 - 32 mEq/L   Glucose, Bld 76  70 - 99 mg/dL   BUN 51 (*) 6 - 23 mg/dL   Creatinine, Ser 3.60 (*) 0.50 - 1.10 mg/dL   Calcium 10.8 (*) 8.4 - 10.5 mg/dL   GFR calc non Af Amer 13 (*) >90 mL/min   GFR calc Af Amer 16 (*) >90 mL/min   Comment: (NOTE)     The eGFR has been calculated using the CKD EPI equation.     This calculation has not been validated in all clinical situations.     eGFR's persistently <90 mL/min signify possible Chronic Kidney     Disease.     Performed at [facility] METABOLIC PANEL     Status: Abnormal   Collection Time    09/30/13  6:35 AM      Result Value Ref Range   Sodium 143  137 - 147 mEq/L   Potassium 4.7  3.7 - 5.3 mEq/L   Chloride 104  96 - 112 mEq/L   CO2 31  19 - 32 mEq/L   Glucose, Bld 83  70 - 99 mg/dL   BUN 47 (*) 6 - 23 mg/dL   Creatinine, Ser 3.64 (*) 0.50 - 1.10 mg/dL   Calcium 10.7 (*) 8.4 - 10.5 mg/dL   GFR calc non Af Amer 13 (*) >90 mL/min   GFR calc Af Amer 15 (*) >90 mL/min   Comment: (NOTE)     The eGFR has been calculated using the CKD EPI  equation.     This calculation has not been validated in all clinical situations.     eGFR's persistently <90 mL/min signify possible Chronic Kidney  Disease.     Performed at [facility]    Physical Findings: AIMS: Facial and Oral Movements Muscles of Facial Expression: None, normal Lips and Perioral Area: None, normal Jaw: None, normal Tongue: None, normal,Extremity Movements Upper (arms, wrists, hands, fingers): None, normal Lower (legs, knees, ankles, toes): None, normal, Trunk Movements Neck, shoulders, hips: None, normal, Overall Severity Severity of abnormal movements (highest score from questions above): None, normal Incapacitation due to abnormal movements: None, normal Patient's awareness of abnormal movements (rate only patient's report): No Awareness, Dental Status Current problems with teeth and/or dentures?: No Does patient usually wear dentures?: Yes  CIWA:  CIWA-Ar Total: 3 COWS:  COWS Total Score: 3  Psychiatric Specialty Exam: See Psychiatric Specialty Exam and Suicide Risk Assessment completed by Attending Physician prior to discharge.  Discharge destination:  Home  Is patient on multiple antipsychotic therapies at discharge:  No   Has Patient had three or more failed trials of antipsychotic monotherapy by history:  No  Recommended Plan for Multiple Antipsychotic Therapies: NA  Discharge Instructions   Diet - low sodium heart healthy    Complete by:  As directed      Discharge instructions    Complete by:  As directed   Take all of your medications as directed. Be sure to keep all of your follow up appointments.  If you are unable to keep your follow up appointment, call your Doctor's office to let them know, and reschedule.  Make sure that you have enough medication to last until your appointment. Be sure to get plenty of rest. Going to bed at the same time each night will help. Try to avoid sleeping during the day.  Increase your  activity as tolerated. Regular exercise will help you to sleep better and improve your mental health. Eating a heart healthy diet is recommended. Try to avoid salty or fried foods. Be sure to avoid all alcohol and illegal drugs.     Increase activity slowly    Complete by:  As directed             Medication List    STOP taking these medications       CENTRUM SILVER PO     [employer] MELATONIN 5 MG Tabs  Generic drug:  Melatonin     gabapentin 600 MG tablet  Commonly known as:  NEURONTIN  Replaced by:  gabapentin 100 MG capsule     GOODY HEADACHE PO     omeprazole 20 MG capsule  Commonly known as:  PRILOSEC     orphenadrine 100 MG tablet  Commonly known as:  NORFLEX      TAKE these medications     Indication   albuterol (2.5 MG/3ML) 0.083% nebulizer solution  Commonly known as:  PROVENTIL  Take 2.5 mg by nebulization 2 (two) times daily as needed for wheezing or shortness of breath.     Shortness of breath, wheezing   PROAIR HFA 108 (90 BASE) MCG/ACT inhaler  Generic drug:  albuterol  Inhale 1 puff into the lungs every 4 (four) hours as needed for wheezing or shortness of breath.     Shortness of breath   divalproex 500 MG 24 hr tablet  Commonly known as:  DEPAKOTE ER  Take 1 tablet (500 mg total) by mouth at bedtime.   Indication:  Mood stabilization.     Fluticasone-Salmeterol 100-50 MCG/DOSE Aepb  Commonly known as:  ADVAIR  Inhale 1 puff into the lungs 2 (two) times daily.  Indication:  Chronic Obstructive Lung Disease     furosemide 20 MG tablet  Commonly known as:  LASIX  Take 5 tablets (100 mg total) by mouth 2 (two) times daily.   Indication:  Edema     gabapentin 100 MG capsule  Commonly known as:  NEURONTIN  Take 2 capsules (200 mg total) by mouth 2 (two) times daily.     For anxiety   hydrOXYzine 50 MG tablet  Commonly known as:  ATARAX/VISTARIL  Take 1 tablet (50 mg total) by mouth at bedtime as needed (Sleep).   Indication:  Sedation      metoprolol succinate 100 MG 24 hr tablet  Commonly known as:  TOPROL-XL  Take 1 tablet (100 mg total) by mouth at bedtime. Take with or immediately following a meal.   Indication:  Feeling Anxious     ziprasidone 60 MG capsule  Commonly known as:  GEODON  Take 1 capsule (60 mg total) by mouth 2 (two) times daily with a meal.   Indication:  Manic-Depression           Follow-up Information   Follow up with [facility] On 10/10/2013. (Thursday, October 10, 2013 at 10 AM)    Contact information:   [place], [place]   [address]  [phone]      Follow up with Dr. [facility]ene [facility] On 10/30/2013. (Wednesday, October 30, 2013 at 1:45 PM)    Contact information:   [place], [place]   [address]  [phone]      Follow up with [facility]. Schedule an appointment as soon as possible for a visit in 1 week.   Contact information:   [place]  [address] [phone]       Follow-up recommendations:   Activities: Resume activity as tolerated. Diet: Heart healthy low sodium diet Tests: Follow up testing will be determined by your out patient provider. Comments:    Total Discharge Time:  Less than 30 minutes.  Signed: Nena Polio 09/30/2013, 2:28 PM  Patient is seen face to face for the evaluation, suicide risk assessment and made discharge plan. Reviewed the information documented and agree with the treatment plan.  Durward Parcel 10/02/2013 8:27 PM

## 2013-09-30 NOTE — Progress Notes (Signed)
[facility] Adult Case Management Discharge Plan :  Will you be returning to the same living situation after discharge: Yes,  with daughter At discharge, do you have transportation home?:Yes,  by daughter- Jennifer Brawn Do you have the ability to pay for your medications:Yes,  no barriers  Release of information consent forms completed and in the chart;  Patient's signature needed at discharge.  Patient to Follow up at: Follow-up Information   Follow up with [facility] On 10/10/2013. (Thursday, October 10, 2013 at 10 AM)    Contact information:   [place], [place]   [address]  [phone]      Follow up with Dr. [facility] On 10/30/2013. (Wednesday, October 30, 2013 at 1:45 PM)    Contact information:   [place], [place]   [address]  [phone]      Follow up with [facility]. Schedule an appointment as soon as possible for a visit in 1 week.   Contact information:   [place] [address] [phone]       Patient denies SI/HI:   Yes,  patient denies SI/HI    Safety Planning and Suicide Prevention discussed:  Yes,  with patient and family member  CSW spoke with patient's daughter Jennifer Lawson who stated that she will be here to pick patient up at 14:00 for discharge. No other concerns verbalized. Patient deemed stable at time of discharge.   [name]. 09/30/2013, 1:18 PM

## 2013-09-30 NOTE — Progress Notes (Signed)
Patient ID: Jennifer Lawson, female   DOB: [birth year], 53 y.o.   MRN: [id number]  D: Pt has been very anxious on the unit today, pt reports being ready for discharge and is hoping that her daughter picks her up today. Pt reports being anxious because she can not get her daughter on the phone and reports that she has no one to pick her up today. Pt reported on her self inventory sheet that her depression was a 1, and her hopelessness was a 0. Pt reported being negative SI/HI, no AH/VH noted. A: 15 min checks continued for patient safety. R: Pt safety maintained.

## 2013-09-30 NOTE — Consult Note (Addendum)
   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  Subjective  - feeling much better, denies diarrhea, nausea/vomiting. Eating well.   Impression/Recommendations  CKD (chronic kidney disease) - baseline creatinine ranges from 3-4 - she is a bit more fluid up today, trace LE edema, increase Lasix to 100 BID and can go home with that and follow up with nephrology in 1 week.  - Creatinine 4.28>>4.06>>3.72>>3.6 >> 3.6, stabilized - Renal Diet - avoid NSAIDs - instructions for daily weights and fluid restriction have been written in the d/c instructions, also information about [facility] has been added. These were conveyed to the patient as well.   Diarrhea  - Viral vs food poisoning, seems to have resolved.  - please send Cdiff PCR if she has further diarrheal episodes  - imodium PRN for now   DM - CBGs stable, monitor  COPD (chronic obstructive pulmonary disease) - stable, no wheezing, nebs PRN - Tobacco cessation counseled  Depression and other Mental health issues per Psych   Patient with stable renal function and urine output, stable clinically, she needs very close outpatient follow up with renal. Will sign off, please call again as needed. Thank you.    Physical Exam: Blood pressure 120/85, pulse 80, temperature 98.5 F (36.9 C), temperature source Oral, resp. rate 20, height 5\' 2"  (1.575 m), weight 88.451 kg (195 lb), last menstrual period 05/09/2008, SpO2 93.00%. Filed Vitals:   09/30/13 0701   BP: 120/85  Pulse: 80  Temp:   Resp:      General: Alert, awake  HEENT: PERRLA, EOMI  Cardiovascular: S1S2/RRR  Respiratory: CTAB  Abdomen: soft, Nt, BS present  Skin: no rashes or skin breakdown  Musculoskeletal: trace edema c/c, R arm AVF noted  Psychiatric: flat affect  Neurologic: moves all extremities, no localising signs  Labs on Admission:  Basic Metabolic Panel:  Recent Labs Lab 09/27/13 0629 09/28/13 0630 09/30/13 0635  NA 141 141 143  K 4.5 4.1 4.7  CL 103 101 104  CO2 26 28 31   GLUCOSE 81 76 83  BUN 57* 51* 47*  CREATININE 3.72* 3.60* 3.64*  CALCIUM 11.0* 10.8* 10.7*   Liver Function Tests: No results found for this basename: AST, ALT, ALKPHOS, BILITOT, PROT, ALBUMIN,  in the last 168 hours No results found for this basename: LIPASE, AMYLASE,  in the last 168 hours No results found for this basename: AMMONIA,  in the last 168 hours CBC:  Recent Labs Lab 09/27/13 0629  WBC 6.7  HGB 9.9*  HCT 31.3*  MCV 94.6  PLT 223   Cardiac Enzymes: No results found for this basename: CKTOTAL, CKMB, CKMBINDEX, TROPONINI,  in the last 168 hours BNP: No components found with this basename: POCBNP,  CBG:  Recent Labs Lab 09/23/13 2119  GLUCAP 125*    Radiological Exams on Admission: No results found.  Time spent: 25 min  [facility] Pager [phone]  If 7PM-7AM, please contact night-coverage [email or web address] Password [facility] 09/30/2013, 9:52 AM

## 2013-09-30 NOTE — Discharge Instructions (Signed)
Please follow up with [facility] in 1 week Monitor daily weights, if increase in weight 2-3 pounds in one day, take one extra tablet of Lasix and call your kidney doctor right away.  Fluid restriction 1200 mL per day (~5 cups).

## 2013-09-30 NOTE — [facility] Suicide Risk Assessment (Signed)
   Demographic Factors:  Adolescent or young adult, Low socioeconomic status and Unemployed  Total Time spent with patient: 30 minutes  Psychiatric Specialty Exam: Physical Exam  ROS  Blood pressure 120/85, pulse 80, temperature 98.5 F (36.9 C), temperature source Oral, resp. rate 20, height 5\' 2"  (1.575 m), weight 88.451 kg (195 lb), last menstrual period 05/09/2008, SpO2 93.00%.Body mass index is 35.66 kg/(m^2).  General Appearance: Casual  Eye Contact::  Good  Speech:  Clear and Coherent  Volume:  Normal  Mood:  Euthymic  Affect:  Appropriate and Congruent  Thought Process:  Coherent and Goal Directed  Orientation:  Full (Time, Place, and Person)  Thought Content:  WDL  Suicidal Thoughts:  No  Homicidal Thoughts:  No  Memory:  Immediate;   Good  Judgement:  Fair  Insight:  Good  Psychomotor Activity:  Decreased  Concentration:  Good  Recall:  Good  Fund of Knowledge:Good  Language: Good  Akathisia:  NA  Handed:  Right  AIMS (if indicated):     Assets:  Communication Skills Desire for Improvement Financial Resources/Insurance Housing Intimacy Leisure Time Resilience Social Support Talents/Skills Transportation  Sleep:  Number of Hours: 6.75    Musculoskeletal: Strength & Muscle Tone: within normal limits Gait & Station: normal Patient leans: N/A   Mental Status Per Nursing Assessment::   On Admission:     Current Mental Status by Physician: NA  Loss Factors: NA  Historical Factors: NA  Risk Reduction Factors:   Sense of responsibility to family, Religious beliefs about death, Living with another person, especially a relative, Positive social support, Positive therapeutic relationship and Positive coping skills or problem solving skills  Continued Clinical Symptoms:  Bipolar Disorder:   Depressive phase Depression:   Recent sense of peace/wellbeing Previous Psychiatric Diagnoses and Treatments Medical Diagnoses and  Treatments/Surgeries  Cognitive Features That Contribute To Risk:  Polarized thinking    Suicide Risk:  Minimal: No identifiable suicidal ideation.  Patients presenting with no risk factors but with morbid ruminations; may be classified as minimal risk based on the severity of the depressive symptoms  Discharge Diagnoses:   AXIS I:  Bipolar, Depressed AXIS II:  Deferred AXIS III:   Past Medical History  Diagnosis Date  . Mental disorder   . Depression   . Hypertension   . Overdose   . Tobacco use disorder 11/13/2012  . Complication of anesthesia     difficulty going to sleep  . Chronic kidney disease     06/11/13- not on dialysis  . Shortness of breath     lying down flat  . Diabetes mellitus without complication     denies  . PTSD (post-traumatic stress disorder)   . Asthma   . COPD (chronic obstructive pulmonary disease)   . Heart murmur   . GERD (gastroesophageal reflux disease)   . Seizures     "passsed out"  . History of blood transfusion    AXIS IV:  other psychosocial or environmental problems, problems related to social environment and problems with primary support group AXIS V:  61-70 mild symptoms  Plan Of Care/Follow-up recommendations:  Activity:  As tolerated Diet:  Regular  Is patient on multiple antipsychotic therapies at discharge:  No   Has Patient had three or more failed trials of antipsychotic monotherapy by history:  No  Recommended Plan for Multiple Antipsychotic Therapies: NA    Jennifer Lawson 09/30/2013, 2:24 PM

## 2013-10-03 NOTE — Progress Notes (Signed)
Patient Discharge Instructions:  After Visit Summary (AVS):   Faxed to:  10/03/13 Discharge Summary Note:   Faxed to:  10/03/13 Psychiatric Admission Assessment Note:   Faxed to:  10/03/13 Suicide Risk Assessment - Discharge Assessment:   Faxed to:  10/03/13 Faxed/Sent to the Next Level Care provider:  10/03/13 Faxed to [facility], 10/03/2013, 3:37 PM

## 2013-10-30 DIAGNOSIS — F4310 Post-traumatic stress disorder, unspecified: Secondary | ICD-10-CM | POA: Diagnosis not present

## 2013-10-30 DIAGNOSIS — F3132 Bipolar disorder, current episode depressed, moderate: Secondary | ICD-10-CM | POA: Diagnosis not present

## 2013-10-30 DIAGNOSIS — F29 Unspecified psychosis not due to a substance or known physiological condition: Secondary | ICD-10-CM | POA: Diagnosis not present

## 2013-11-06 DIAGNOSIS — Z79899 Other long term (current) drug therapy: Secondary | ICD-10-CM | POA: Diagnosis not present

## 2013-11-06 DIAGNOSIS — I770 Arteriovenous fistula, acquired: Secondary | ICD-10-CM | POA: Diagnosis not present

## 2013-11-06 DIAGNOSIS — N184 Chronic kidney disease, stage 4 (severe): Secondary | ICD-10-CM | POA: Diagnosis not present

## 2013-11-23 ENCOUNTER — Other Ambulatory Visit (HOSPITAL_COMMUNITY): Payer: Self-pay | Admitting: Physician Assistant

## 2013-11-29 DIAGNOSIS — E872 Acidosis, unspecified: Secondary | ICD-10-CM | POA: Diagnosis not present

## 2013-11-29 DIAGNOSIS — N185 Chronic kidney disease, stage 5: Secondary | ICD-10-CM | POA: Diagnosis not present

## 2013-11-29 DIAGNOSIS — I129 Hypertensive chronic kidney disease with stage 1 through stage 4 chronic kidney disease, or unspecified chronic kidney disease: Secondary | ICD-10-CM | POA: Diagnosis not present

## 2013-11-29 DIAGNOSIS — R4182 Altered mental status, unspecified: Secondary | ICD-10-CM | POA: Diagnosis not present

## 2013-11-29 DIAGNOSIS — R0609 Other forms of dyspnea: Secondary | ICD-10-CM | POA: Diagnosis not present

## 2013-11-29 DIAGNOSIS — N189 Chronic kidney disease, unspecified: Secondary | ICD-10-CM | POA: Diagnosis not present

## 2013-11-29 DIAGNOSIS — R0989 Other specified symptoms and signs involving the circulatory and respiratory systems: Secondary | ICD-10-CM | POA: Diagnosis not present

## 2013-11-29 DIAGNOSIS — R339 Retention of urine, unspecified: Secondary | ICD-10-CM | POA: Diagnosis present

## 2013-11-29 DIAGNOSIS — N289 Disorder of kidney and ureter, unspecified: Secondary | ICD-10-CM | POA: Diagnosis not present

## 2013-11-29 DIAGNOSIS — E861 Hypovolemia: Secondary | ICD-10-CM | POA: Diagnosis not present

## 2013-11-29 DIAGNOSIS — N281 Cyst of kidney, acquired: Secondary | ICD-10-CM | POA: Diagnosis not present

## 2013-11-29 DIAGNOSIS — E8779 Other fluid overload: Secondary | ICD-10-CM | POA: Diagnosis not present

## 2013-11-29 DIAGNOSIS — G9340 Encephalopathy, unspecified: Secondary | ICD-10-CM | POA: Diagnosis present

## 2013-11-29 DIAGNOSIS — R002 Palpitations: Secondary | ICD-10-CM | POA: Diagnosis not present

## 2013-11-29 DIAGNOSIS — R9431 Abnormal electrocardiogram [ECG] [EKG]: Secondary | ICD-10-CM | POA: Diagnosis not present

## 2013-11-29 DIAGNOSIS — I10 Essential (primary) hypertension: Secondary | ICD-10-CM | POA: Diagnosis not present

## 2013-11-29 DIAGNOSIS — E870 Hyperosmolality and hypernatremia: Secondary | ICD-10-CM | POA: Diagnosis not present

## 2013-11-29 DIAGNOSIS — R000 Tachycardia, unspecified: Secondary | ICD-10-CM | POA: Diagnosis not present

## 2013-11-29 DIAGNOSIS — I498 Other specified cardiac arrhythmias: Secondary | ICD-10-CM | POA: Diagnosis not present

## 2013-11-29 DIAGNOSIS — F319 Bipolar disorder, unspecified: Secondary | ICD-10-CM | POA: Diagnosis present

## 2013-11-29 DIAGNOSIS — I120 Hypertensive chronic kidney disease with stage 5 chronic kidney disease or end stage renal disease: Secondary | ICD-10-CM | POA: Diagnosis not present

## 2013-11-29 DIAGNOSIS — N179 Acute kidney failure, unspecified: Secondary | ICD-10-CM | POA: Diagnosis not present

## 2013-11-29 DIAGNOSIS — I050 Rheumatic mitral stenosis: Secondary | ICD-10-CM | POA: Diagnosis not present

## 2013-11-29 DIAGNOSIS — J984 Other disorders of lung: Secondary | ICD-10-CM | POA: Diagnosis not present

## 2013-11-29 DIAGNOSIS — R569 Unspecified convulsions: Secondary | ICD-10-CM | POA: Diagnosis present

## 2013-11-29 DIAGNOSIS — D649 Anemia, unspecified: Secondary | ICD-10-CM | POA: Diagnosis not present

## 2013-12-24 DIAGNOSIS — F3132 Bipolar disorder, current episode depressed, moderate: Secondary | ICD-10-CM | POA: Diagnosis not present

## 2013-12-24 DIAGNOSIS — F4310 Post-traumatic stress disorder, unspecified: Secondary | ICD-10-CM | POA: Diagnosis not present

## 2013-12-24 DIAGNOSIS — F29 Unspecified psychosis not due to a substance or known physiological condition: Secondary | ICD-10-CM | POA: Diagnosis not present

## 2014-01-15 ENCOUNTER — Inpatient Hospital Stay (HOSPITAL_COMMUNITY)
Admission: EM | Admit: 2014-01-15 | Discharge: 2014-01-22 | DRG: 208 | Disposition: A | Discharge status: Home Health | Payer: Medicare Other | Attending: Internal Medicine | Admitting: Internal Medicine

## 2014-01-15 ENCOUNTER — Emergency Department (HOSPITAL_COMMUNITY): Payer: Medicare Other

## 2014-01-15 ENCOUNTER — Inpatient Hospital Stay (HOSPITAL_COMMUNITY): Payer: Medicare Other

## 2014-01-15 ENCOUNTER — Encounter (HOSPITAL_COMMUNITY): Payer: Self-pay | Admitting: Emergency Medicine

## 2014-01-15 DIAGNOSIS — Z452 Encounter for adjustment and management of vascular access device: Secondary | ICD-10-CM | POA: Diagnosis not present

## 2014-01-15 DIAGNOSIS — G40909 Epilepsy, unspecified, not intractable, without status epilepticus: Secondary | ICD-10-CM | POA: Diagnosis present

## 2014-01-15 DIAGNOSIS — E872 Acidosis, unspecified: Secondary | ICD-10-CM

## 2014-01-15 DIAGNOSIS — N179 Acute kidney failure, unspecified: Secondary | ICD-10-CM

## 2014-01-15 DIAGNOSIS — N186 End stage renal disease: Secondary | ICD-10-CM

## 2014-01-15 DIAGNOSIS — I10 Essential (primary) hypertension: Secondary | ICD-10-CM

## 2014-01-15 DIAGNOSIS — J9600 Acute respiratory failure, unspecified whether with hypoxia or hypercapnia: Secondary | ICD-10-CM

## 2014-01-15 DIAGNOSIS — N184 Chronic kidney disease, stage 4 (severe): Secondary | ICD-10-CM | POA: Diagnosis not present

## 2014-01-15 DIAGNOSIS — I5033 Acute on chronic diastolic (congestive) heart failure: Secondary | ICD-10-CM | POA: Diagnosis not present

## 2014-01-15 DIAGNOSIS — N039 Chronic nephritic syndrome with unspecified morphologic changes: Secondary | ICD-10-CM

## 2014-01-15 DIAGNOSIS — J9819 Other pulmonary collapse: Secondary | ICD-10-CM | POA: Diagnosis not present

## 2014-01-15 DIAGNOSIS — T426X1A Poisoning by other antiepileptic and sedative-hypnotic drugs, accidental (unintentional), initial encounter: Secondary | ICD-10-CM | POA: Diagnosis present

## 2014-01-15 DIAGNOSIS — R400 Somnolence: Secondary | ICD-10-CM

## 2014-01-15 DIAGNOSIS — D649 Anemia, unspecified: Secondary | ICD-10-CM | POA: Diagnosis present

## 2014-01-15 DIAGNOSIS — N189 Chronic kidney disease, unspecified: Secondary | ICD-10-CM

## 2014-01-15 DIAGNOSIS — D631 Anemia in chronic kidney disease: Secondary | ICD-10-CM | POA: Diagnosis present

## 2014-01-15 DIAGNOSIS — G9340 Encephalopathy, unspecified: Secondary | ICD-10-CM | POA: Diagnosis present

## 2014-01-15 DIAGNOSIS — E119 Type 2 diabetes mellitus without complications: Secondary | ICD-10-CM | POA: Diagnosis present

## 2014-01-15 DIAGNOSIS — F319 Bipolar disorder, unspecified: Secondary | ICD-10-CM | POA: Diagnosis present

## 2014-01-15 DIAGNOSIS — R000 Tachycardia, unspecified: Secondary | ICD-10-CM

## 2014-01-15 DIAGNOSIS — R0602 Shortness of breath: Secondary | ICD-10-CM

## 2014-01-15 DIAGNOSIS — Z96659 Presence of unspecified artificial knee joint: Secondary | ICD-10-CM | POA: Diagnosis not present

## 2014-01-15 DIAGNOSIS — E860 Dehydration: Secondary | ICD-10-CM

## 2014-01-15 DIAGNOSIS — I129 Hypertensive chronic kidney disease with stage 1 through stage 4 chronic kidney disease, or unspecified chronic kidney disease: Secondary | ICD-10-CM | POA: Diagnosis present

## 2014-01-15 DIAGNOSIS — J9601 Acute respiratory failure with hypoxia: Secondary | ICD-10-CM

## 2014-01-15 DIAGNOSIS — R0902 Hypoxemia: Secondary | ICD-10-CM | POA: Diagnosis not present

## 2014-01-15 DIAGNOSIS — E875 Hyperkalemia: Secondary | ICD-10-CM | POA: Diagnosis present

## 2014-01-15 DIAGNOSIS — Z4682 Encounter for fitting and adjustment of non-vascular catheter: Secondary | ICD-10-CM | POA: Diagnosis not present

## 2014-01-15 DIAGNOSIS — K219 Gastro-esophageal reflux disease without esophagitis: Secondary | ICD-10-CM | POA: Diagnosis present

## 2014-01-15 DIAGNOSIS — J4489 Other specified chronic obstructive pulmonary disease: Secondary | ICD-10-CM | POA: Diagnosis present

## 2014-01-15 DIAGNOSIS — E874 Mixed disorder of acid-base balance: Secondary | ICD-10-CM | POA: Diagnosis present

## 2014-01-15 DIAGNOSIS — R0989 Other specified symptoms and signs involving the circulatory and respiratory systems: Secondary | ICD-10-CM | POA: Diagnosis not present

## 2014-01-15 DIAGNOSIS — E870 Hyperosmolality and hypernatremia: Secondary | ICD-10-CM | POA: Diagnosis present

## 2014-01-15 DIAGNOSIS — E210 Primary hyperparathyroidism: Secondary | ICD-10-CM

## 2014-01-15 DIAGNOSIS — J690 Pneumonitis due to inhalation of food and vomit: Secondary | ICD-10-CM

## 2014-01-15 DIAGNOSIS — J441 Chronic obstructive pulmonary disease with (acute) exacerbation: Secondary | ICD-10-CM

## 2014-01-15 DIAGNOSIS — F17200 Nicotine dependence, unspecified, uncomplicated: Secondary | ICD-10-CM | POA: Diagnosis present

## 2014-01-15 DIAGNOSIS — R404 Transient alteration of awareness: Secondary | ICD-10-CM | POA: Diagnosis not present

## 2014-01-15 DIAGNOSIS — F4310 Post-traumatic stress disorder, unspecified: Secondary | ICD-10-CM | POA: Diagnosis present

## 2014-01-15 DIAGNOSIS — I509 Heart failure, unspecified: Secondary | ICD-10-CM

## 2014-01-15 DIAGNOSIS — R4182 Altered mental status, unspecified: Secondary | ICD-10-CM | POA: Diagnosis not present

## 2014-01-15 DIAGNOSIS — F333 Major depressive disorder, recurrent, severe with psychotic symptoms: Secondary | ICD-10-CM

## 2014-01-15 DIAGNOSIS — J449 Chronic obstructive pulmonary disease, unspecified: Secondary | ICD-10-CM | POA: Diagnosis present

## 2014-01-15 LAB — COMPREHENSIVE METABOLIC PANEL
ALT: 11 U/L (ref 0–35)
ALT: 16 U/L (ref 0–35)
AST: 27 U/L (ref 0–37)
AST: 41 U/L — ABNORMAL HIGH (ref 0–37)
Albumin: 2.9 g/dL — ABNORMAL LOW (ref 3.5–5.2)
Albumin: 2.9 g/dL — ABNORMAL LOW (ref 3.5–5.2)
Alkaline Phosphatase: 67 U/L (ref 39–117)
Alkaline Phosphatase: 67 U/L (ref 39–117)
Anion gap: 14 (ref 5–15)
Anion gap: 16 — ABNORMAL HIGH (ref 5–15)
BUN: 53 mg/dL — ABNORMAL HIGH (ref 6–23)
BUN: 53 mg/dL — ABNORMAL HIGH (ref 6–23)
CO2: 19 mEq/L (ref 19–32)
CO2: 18 mEq/L — ABNORMAL LOW (ref 19–32)
Calcium: 9.9 mg/dL (ref 8.4–10.5)
Calcium: 9.8 mg/dL (ref 8.4–10.5)
Chloride: 109 mEq/L (ref 96–112)
Chloride: 108 mEq/L (ref 96–112)
Creatinine, Ser: 4.87 mg/dL — ABNORMAL HIGH (ref 0.50–1.10)
Creatinine, Ser: 4.89 mg/dL — ABNORMAL HIGH (ref 0.50–1.10)
GFR calc Af Amer: 11 mL/min — ABNORMAL LOW (ref >90)
GFR calc Af Amer: 11 mL/min — ABNORMAL LOW (ref >90)
GFR calc non Af Amer: 9 mL/min — ABNORMAL LOW (ref >90)
GFR calc non Af Amer: 9 mL/min — ABNORMAL LOW (ref >90)
Glucose, Bld: 69 mg/dL — ABNORMAL LOW (ref 70–99)
Glucose, Bld: 99 mg/dL (ref 70–99)
Potassium: 5.7 mEq/L — ABNORMAL HIGH (ref 3.7–5.3)
Potassium: 4.7 mEq/L (ref 3.7–5.3)
Sodium: 142 mEq/L (ref 137–147)
Sodium: 142 mEq/L (ref 137–147)
Total Bilirubin: <0.2 mg/dL — ABNORMAL LOW (ref 0.3–1.2)
Total Bilirubin: <0.2 mg/dL — ABNORMAL LOW (ref 0.3–1.2)
Total Protein: 7.6 g/dL (ref 6.0–8.3)
Total Protein: 7.6 g/dL (ref 6.0–8.3)

## 2014-01-15 LAB — I-STAT ARTERIAL BLOOD GAS, ED
Acid-base deficit: 8 mmol/L — ABNORMAL HIGH (ref 0.0–2.0)
Bicarbonate: 21.2 mEq/L (ref 20.0–24.0)
O2 Saturation: 84 %
Patient temperature: 99.5
TCO2: 23 mmol/L (ref 0–100)
pCO2 arterial: 58.7 mmHg (ref 35.0–45.0)
pH, Arterial: 7.168 — CL (ref 7.350–7.450)
pO2, Arterial: 64 mmHg — ABNORMAL LOW (ref 80.0–100.0)

## 2014-01-15 LAB — CBC WITH DIFFERENTIAL/PLATELET
Basophils Absolute: 0 10*3/uL (ref 0.0–0.1)
Basophils Relative: 0 % (ref 0–1)
Eosinophils Absolute: 0 10*3/uL (ref 0.0–0.7)
Eosinophils Relative: 0 % (ref 0–5)
HCT: 35 % — ABNORMAL LOW (ref 36.0–46.0)
Hemoglobin: 10.9 g/dL — ABNORMAL LOW (ref 12.0–15.0)
Lymphocytes Relative: 5 % — ABNORMAL LOW (ref 12–46)
Lymphs Abs: 0.3 10*3/uL — ABNORMAL LOW (ref 0.7–4.0)
MCH: 30.9 pg (ref 26.0–34.0)
MCHC: 31.1 g/dL (ref 30.0–36.0)
MCV: 99.2 fL (ref 78.0–100.0)
Monocytes Absolute: 0.3 10*3/uL (ref 0.1–1.0)
Monocytes Relative: 4 % (ref 3–12)
Neutro Abs: 6 10*3/uL (ref 1.7–7.7)
Neutrophils Relative %: 91 % — ABNORMAL HIGH (ref 43–77)
Platelets: 320 10*3/uL (ref 150–400)
RBC: 3.53 MIL/uL — ABNORMAL LOW (ref 3.87–5.11)
RDW: 15.9 % — ABNORMAL HIGH (ref 11.5–15.5)
WBC: 6.6 10*3/uL (ref 4.0–10.5)

## 2014-01-15 LAB — URINALYSIS, ROUTINE W REFLEX MICROSCOPIC
Bilirubin Urine: NEGATIVE
Glucose, UA: NEGATIVE mg/dL
Hgb urine dipstick: NEGATIVE
Ketones, ur: NEGATIVE mg/dL
Leukocytes, UA: NEGATIVE
Nitrite: NEGATIVE
Protein, ur: 100 mg/dL — AB
Specific Gravity, Urine: 1.012 (ref 1.005–1.030)
Urobilinogen, UA: 0.2 mg/dL (ref 0.0–1.0)
pH: 5.5 (ref 5.0–8.0)

## 2014-01-15 LAB — BLOOD GAS, ARTERIAL
Acid-base deficit: 7.9 mmol/L — ABNORMAL HIGH (ref 0.0–2.0)
Bicarbonate: 18.6 mEq/L — ABNORMAL LOW (ref 20.0–24.0)
Drawn by: 252031
FIO2: 1 %
MECHVT: 440 mL
O2 Saturation: 98 %
PEEP: 8 cmH2O
Patient temperature: 98.6
RATE: 26 resp/min
TCO2: 20 mmol/L (ref 0–100)
pCO2 arterial: 47.6 mmHg — ABNORMAL HIGH (ref 35.0–45.0)
pH, Arterial: 7.216 — ABNORMAL LOW (ref 7.350–7.450)
pO2, Arterial: 114 mmHg — ABNORMAL HIGH (ref 80.0–100.0)

## 2014-01-15 LAB — GLUCOSE, CAPILLARY
Glucose-Capillary: 60 mg/dL — ABNORMAL LOW (ref 70–99)
Glucose-Capillary: 155 mg/dL — ABNORMAL HIGH (ref 70–99)
Glucose-Capillary: 85 mg/dL (ref 70–99)

## 2014-01-15 LAB — PROCALCITONIN: Procalcitonin: 15.02 ng/mL

## 2014-01-15 LAB — RAPID URINE DRUG SCREEN, HOSP PERFORMED
Amphetamines: NOT DETECTED
Barbiturates: NOT DETECTED
Benzodiazepines: NOT DETECTED
Cocaine: NOT DETECTED
Opiates: NOT DETECTED
Tetrahydrocannabinol: NOT DETECTED

## 2014-01-15 LAB — I-STAT TROPONIN, ED: Troponin i, poc: 0.08 ng/mL (ref 0.00–0.08)

## 2014-01-15 LAB — LITHIUM LEVEL: Lithium Lvl: <0.25 mEq/L — ABNORMAL LOW (ref 0.80–1.40)

## 2014-01-15 LAB — POCT I-STAT 3, ART BLOOD GAS (G3+)
Acid-base deficit: 9 mmol/L — ABNORMAL HIGH (ref 0.0–2.0)
Bicarbonate: 19.2 mEq/L — ABNORMAL LOW (ref 20.0–24.0)
O2 Saturation: 94 %
TCO2: 21 mmol/L (ref 0–100)
pCO2 arterial: 49.8 mmHg — ABNORMAL HIGH (ref 35.0–45.0)
pH, Arterial: 7.194 — CL (ref 7.350–7.450)
pO2, Arterial: 88 mmHg (ref 80.0–100.0)

## 2014-01-15 LAB — I-STAT CHEM 8, ED
BUN: 66 mg/dL — ABNORMAL HIGH (ref 6–23)
Calcium, Ion: 1.31 mmol/L — ABNORMAL HIGH (ref 1.12–1.23)
Chloride: 117 mEq/L — ABNORMAL HIGH (ref 96–112)
Creatinine, Ser: 5.3 mg/dL — ABNORMAL HIGH (ref 0.50–1.10)
Glucose, Bld: 70 mg/dL (ref 70–99)
HCT: 35 % — ABNORMAL LOW (ref 36.0–46.0)
Hemoglobin: 11.9 g/dL — ABNORMAL LOW (ref 12.0–15.0)
Potassium: 5.4 mEq/L — ABNORMAL HIGH (ref 3.7–5.3)
Sodium: 141 mEq/L (ref 137–147)
TCO2: 22 mmol/L (ref 0–100)

## 2014-01-15 LAB — ACETAMINOPHEN LEVEL: Acetaminophen (Tylenol), Serum: <15 ug/mL (ref 10–30)

## 2014-01-15 LAB — URINE MICROSCOPIC-ADD ON

## 2014-01-15 LAB — MAGNESIUM: Magnesium: 2.9 mg/dL — ABNORMAL HIGH (ref 1.5–2.5)

## 2014-01-15 LAB — MRSA PCR SCREENING: MRSA by PCR: NEGATIVE

## 2014-01-15 LAB — TSH: TSH: 0.279 u[IU]/mL — ABNORMAL LOW (ref 0.350–4.500)

## 2014-01-15 LAB — I-STAT CG4 LACTIC ACID, ED: Lactic Acid, Venous: 0.86 mmol/L (ref 0.5–2.2)

## 2014-01-15 LAB — ETHANOL: Alcohol, Ethyl (B): <11 mg/dL (ref 0–11)

## 2014-01-15 LAB — PHOSPHORUS: Phosphorus: 3.9 mg/dL (ref 2.3–4.6)

## 2014-01-15 LAB — SALICYLATE LEVEL: Salicylate Lvl: <2 mg/dL — ABNORMAL LOW (ref 2.8–20.0)

## 2014-01-15 LAB — VALPROIC ACID LEVEL: Valproic Acid Lvl: 38.1 ug/mL — ABNORMAL LOW (ref 50.0–100.0)

## 2014-01-15 MED ORDER — ETOMIDATE 2 MG/ML IV SOLN
INTRAVENOUS | Status: AC | PRN
  Administered 2014-01-15: 20 mg via INTRAVENOUS

## 2014-01-15 MED ORDER — ACETAMINOPHEN 160 MG/5ML PO SOLN
650.0000 mg | Freq: Four times a day (QID) | ORAL | Status: DC | PRN
  Administered 2014-01-15 – 2014-01-19 (×4): 650 mg
  Filled 2014-01-15 (×4): qty 20.3

## 2014-01-15 MED ORDER — PROPOFOL 10 MG/ML IV EMUL
0.0000 ug/kg/min | INTRAVENOUS | Status: DC
Start: 2014-01-15 — End: 2014-01-19
  Administered 2014-01-15: 10 ug/kg/min via INTRAVENOUS
  Administered 2014-01-16 (×2): 40 ug/kg/min via INTRAVENOUS
  Administered 2014-01-16: 30 ug/kg/min via INTRAVENOUS
  Administered 2014-01-16: 15 ug/kg/min via INTRAVENOUS
  Administered 2014-01-16 – 2014-01-17 (×2): 30 ug/kg/min via INTRAVENOUS
  Administered 2014-01-17 (×2): 40 ug/kg/min via INTRAVENOUS
  Administered 2014-01-18: 20 ug/kg/min via INTRAVENOUS
  Administered 2014-01-18: 5 ug/kg/min via INTRAVENOUS
  Administered 2014-01-19: 20 ug/kg/min via INTRAVENOUS
  Filled 2014-01-15 (×11): qty 100

## 2014-01-15 MED ORDER — MIDAZOLAM HCL 2 MG/2ML IJ SOLN
INTRAMUSCULAR | Status: AC
  Filled 2014-01-15: qty 2

## 2014-01-15 MED ORDER — SODIUM CHLORIDE 0.9 % IV SOLN
INTRAVENOUS | Status: DC
  Administered 2014-01-15: 16:00:00 via INTRAVENOUS

## 2014-01-15 MED ORDER — PHENYLEPHRINE HCL 10 MG/ML IJ SOLN
30.0000 ug/min | INTRAVENOUS | Status: DC
  Filled 2014-01-15: qty 1

## 2014-01-15 MED ORDER — VANCOMYCIN HCL IN DEXTROSE 1-5 GM/200ML-% IV SOLN
1000.0000 mg | INTRAVENOUS | Status: DC
  Filled 2014-01-15: qty 200

## 2014-01-15 MED ORDER — DEXTROSE 50 % IV SOLN: 50.0000 mL | Freq: Once | INTRAVENOUS | Status: AC | PRN

## 2014-01-15 MED ORDER — ROCURONIUM BROMIDE 50 MG/5ML IV SOLN
INTRAVENOUS | Status: AC
  Filled 2014-01-15: qty 2

## 2014-01-15 MED ORDER — SODIUM CHLORIDE 0.9 % IV SOLN
INTRAVENOUS | Status: DC
  Administered 2014-01-15: 20:00:00 via INTRAVENOUS

## 2014-01-15 MED ORDER — DEXTROSE 50 % IV SOLN
INTRAVENOUS | Status: AC
  Administered 2014-01-15: 50 mL
  Filled 2014-01-15: qty 50

## 2014-01-15 MED ORDER — ROCURONIUM BROMIDE 50 MG/5ML IV SOLN
INTRAVENOUS | Status: AC | PRN
  Administered 2014-01-15: 100 mg via INTRAVENOUS

## 2014-01-15 MED ORDER — SODIUM CHLORIDE 0.9 % IV SOLN
3.0000 g | INTRAVENOUS | Status: DC
  Administered 2014-01-15 – 2014-01-17 (×3): 3 g via INTRAVENOUS
  Filled 2014-01-15 (×4): qty 3

## 2014-01-15 MED ORDER — ETOMIDATE 2 MG/ML IV SOLN
INTRAVENOUS | Status: AC
  Filled 2014-01-15: qty 20

## 2014-01-15 MED ORDER — LEVETIRACETAM IN NACL 1500 MG/100ML IV SOLN
1500.0000 mg | Freq: Once | INTRAVENOUS | Status: AC
  Administered 2014-01-15: 1500 mg via INTRAVENOUS
  Filled 2014-01-15: qty 100

## 2014-01-15 MED ORDER — CETYLPYRIDINIUM CHLORIDE 0.05 % MT LIQD
7.0000 mL | Freq: Four times a day (QID) | OROMUCOSAL | Status: DC
  Administered 2014-01-16 – 2014-01-19 (×15): 7 mL via OROMUCOSAL

## 2014-01-15 MED ORDER — VANCOMYCIN HCL 10 G IV SOLR
1500.0000 mg | Freq: Once | INTRAVENOUS | Status: AC
  Administered 2014-01-15: 1500 mg via INTRAVENOUS
  Filled 2014-01-15: qty 1500

## 2014-01-15 MED ORDER — SODIUM CHLORIDE 0.9 % IV BOLUS (SEPSIS): 30.0000 mL/kg | Freq: Once | INTRAVENOUS | Status: DC

## 2014-01-15 MED ORDER — ACETAMINOPHEN 325 MG PO TABS: 325.0000 mg | ORAL_TABLET | Freq: Once | ORAL | Status: DC

## 2014-01-15 MED ORDER — ACETYLCYSTEINE 20 % IN SOLN
4.0000 mL | Freq: Three times a day (TID) | RESPIRATORY_TRACT | Status: DC
  Administered 2014-01-15 – 2014-01-17 (×5): 4 mL via RESPIRATORY_TRACT
  Filled 2014-01-15 (×10): qty 4

## 2014-01-15 MED ORDER — FENTANYL CITRATE 0.05 MG/ML IJ SOLN
100.0000 ug | INTRAMUSCULAR | Status: DC | PRN
  Administered 2014-01-15: 50 ug via INTRAVENOUS
  Administered 2014-01-16: 100 ug via INTRAVENOUS
  Administered 2014-01-16: 50 ug via INTRAVENOUS
  Administered 2014-01-16 – 2014-01-18 (×7): 100 ug via INTRAVENOUS
  Filled 2014-01-15 (×10): qty 2

## 2014-01-15 MED ORDER — SODIUM CHLORIDE 0.9 % IV BOLUS (SEPSIS)
1000.0000 mL | Freq: Once | INTRAVENOUS | Status: AC
  Administered 2014-01-15: 1000 mL via INTRAVENOUS

## 2014-01-15 MED ORDER — FENTANYL CITRATE 0.05 MG/ML IJ SOLN
INTRAMUSCULAR | Status: AC
  Filled 2014-01-15: qty 2

## 2014-01-15 MED ORDER — PANTOPRAZOLE SODIUM 40 MG IV SOLR
40.0000 mg | Freq: Every day | INTRAVENOUS | Status: DC
  Administered 2014-01-15: 40 mg via INTRAVENOUS
  Filled 2014-01-15 (×2): qty 40

## 2014-01-15 MED ORDER — IPRATROPIUM-ALBUTEROL 0.5-2.5 (3) MG/3ML IN SOLN
3.0000 mL | Freq: Four times a day (QID) | RESPIRATORY_TRACT | Status: DC
  Administered 2014-01-15 – 2014-01-21 (×24): 3 mL via RESPIRATORY_TRACT
  Filled 2014-01-15 (×24): qty 3

## 2014-01-15 MED ORDER — FENTANYL CITRATE 0.05 MG/ML IJ SOLN
50.0000 ug | INTRAMUSCULAR | Status: DC | PRN
  Filled 2014-01-15 (×2): qty 2

## 2014-01-15 MED ORDER — ALBUTEROL SULFATE (2.5 MG/3ML) 0.083% IN NEBU
2.5000 mg | INHALATION_SOLUTION | RESPIRATORY_TRACT | Status: DC | PRN
  Administered 2014-01-19 – 2014-01-21 (×4): 2.5 mg via RESPIRATORY_TRACT
  Filled 2014-01-15 (×4): qty 3

## 2014-01-15 MED ORDER — SUCCINYLCHOLINE CHLORIDE 20 MG/ML IJ SOLN
INTRAMUSCULAR | Status: AC
  Filled 2014-01-15: qty 1

## 2014-01-15 MED ORDER — CHLORHEXIDINE GLUCONATE 0.12 % MT SOLN
15.0000 mL | Freq: Two times a day (BID) | OROMUCOSAL | Status: DC
  Administered 2014-01-15 – 2014-01-19 (×8): 15 mL via OROMUCOSAL
  Filled 2014-01-15 (×8): qty 15

## 2014-01-15 MED ORDER — NALOXONE HCL 1 MG/ML IJ SOLN
INTRAMUSCULAR | Status: AC | PRN
  Administered 2014-01-15: 2 mg via INTRAMUSCULAR

## 2014-01-15 MED ORDER — PIPERACILLIN-TAZOBACTAM 3.375 G IVPB 30 MIN
3.3750 g | Freq: Once | INTRAVENOUS | Status: AC
  Administered 2014-01-15: 3.375 g via INTRAVENOUS
  Filled 2014-01-15: qty 50

## 2014-01-15 MED ORDER — SODIUM CHLORIDE 0.9 % IV SOLN: 1000.0000 mL | INTRAVENOUS | Status: DC

## 2014-01-15 MED ORDER — LIDOCAINE HCL (CARDIAC) 20 MG/ML IV SOLN
INTRAVENOUS | Status: AC
  Filled 2014-01-15: qty 5

## 2014-01-15 MED ORDER — PROPOFOL 10 MG/ML IV EMUL
0.0000 ug/kg/min | INTRAVENOUS | Status: DC
  Administered 2014-01-15: 18:00:00 via INTRAVENOUS
  Filled 2014-01-15: qty 100

## 2014-01-15 MED ORDER — SODIUM CHLORIDE 0.9 % IV SOLN: 250.0000 mL | INTRAVENOUS | Status: DC | PRN

## 2014-01-15 MED ORDER — NALOXONE HCL 1 MG/ML IJ SOLN
INTRAMUSCULAR | Status: AC
Start: 2014-01-15 — End: 2014-01-16
  Filled 2014-01-15: qty 2

## 2014-01-15 NOTE — Procedures (Signed)
Central Venous Catheter Insertion Procedure Note Jennifer Lawson [id number] October 16, 1960  Procedure: Insertion of Central Venous Catheter Indications: Assessment of intravascular volume, Drug and/or fluid administration and Frequent blood sampling  Procedure Details Consent: Unable to obtain consent because of altered level of consciousness. Time Out: Verified patient identification, verified procedure, site/side was marked, verified correct patient position, special equipment/implants available, medications/allergies/relevent history reviewed, required imaging and test results available.  Performed  Maximum sterile technique was used including antiseptics, cap, gloves, gown, hand hygiene, mask and sheet. Skin prep: Chlorhexidine; local anesthetic administered A antimicrobial bonded/coated triple lumen catheter was placed in the right internal jugular vein using the Seldinger technique.  Evaluation Blood flow good Complications: No apparent complications Patient did tolerate procedure well. Chest X-ray ordered to verify placement.  CXR: pending.  Procedure performed under direct ultrasound guidance for real time vessel cannulation.      Montey Hora, [place] Pgr: [phone]  or [phone]   Chesley Mires, MD [place] 01/15/2014, 11:19 PM Pager:  [phone] After 3pm call: [phone]

## 2014-01-15 NOTE — ED Notes (Signed)
Neurology at bedside.

## 2014-01-15 NOTE — Sedation Documentation (Signed)
Pt intubated successfully by Md Horton. Good color change. Bilateral breath sounds.

## 2014-01-15 NOTE — ED Notes (Signed)
Pt BP noted to be 71/41. Critical care made aware. Verbal order by Critical care for bolus

## 2014-01-15 NOTE — Consult Note (Signed)
[facility] Renal Consultation Note  Requesting MD: Titus Mould Indication for Consultation: elevated creatinine  HPI:  Jennifer Lawson is a 53 y.o. female with past medical history significant for hypertension, COPD still smoking, seizure disorder and psychiatric disorder not otherwise specified possibly bipolar previously on lithium who also has stage IV CKD followed by Dr. Florene Glen at [facility]. She has an mature AV fistula in her right upper arm and is not yet on dialysis. She was seen by Dr. Florene Glen in July of this year and creatinine was around 4.5.  Patient was brought into the emergency department after being found down at home. She was unresponsive and required intubation. Chest x-ray shows opacification of the entire right air space. History is obtained from the chart and other consultants. The neurology consultant did become aware of information that she took at least 600 mg of Neurontin last evening. 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 She has a Foley catheter in place that is full of urine.  Creatinine, Ser  Date/Time Value Ref Range Status  01/15/2014  2:33 PM 5.30* 0.50 - 1.10 mg/dL Final  01/15/2014  2:07 PM 4.87* 0.50 - 1.10 mg/dL Final  09/30/2013  6:35 AM 3.64* 0.50 - 1.10 mg/dL Final  09/28/2013  6:30 AM 3.60* 0.50 - 1.10 mg/dL Final  09/27/2013  6:29 AM 3.72* 0.50 - 1.10 mg/dL Final  09/21/2013 12:35 PM 4.06* 0.50 - 1.10 mg/dL Final  09/20/2013 10:45 PM 4.28* 0.50 - 1.10 mg/dL Final  07/01/2013  6:37 AM 4.86* 0.50 - 1.10 mg/dL Final  06/30/2013  8:55 AM 5.99* 0.50 - 1.10 mg/dL Final  06/29/2013  3:07 AM 7.28* 0.50 - 1.10 mg/dL Final  06/28/2013  2:05 PM 7.81* 0.50 - 1.10 mg/dL Final  06/28/2013  6:15 AM 7.82* 0.50 - 1.10 mg/dL Final  06/27/2013  5:37 PM 7.11* 0.50 - 1.10 mg/dL Final  06/13/2013  2:33 PM 3.01* 0.50 - 1.10 mg/dL Final  04/26/2013  4:53 AM 3.60* 0.50 - 1.10 mg/dL Final  04/25/2013  4:20 AM 3.18* 0.50 - 1.10 mg/dL  Final  04/24/2013  4:10 AM 3.12* 0.50 - 1.10 mg/dL Final  04/23/2013  1:20 PM 3.07* 0.50 - 1.10 mg/dL Final  01/22/2013  3:58 AM 2.81* 0.50 - 1.10 mg/dL Final  01/21/2013  5:00 AM 2.63* 0.50 - 1.10 mg/dL Final  01/20/2013  9:05 AM 2.53* 0.50 - 1.10 mg/dL Final  01/19/2013  9:30 AM 2.46* 0.50 - 1.10 mg/dL Final  01/18/2013  4:25 AM 2.61* 0.50 - 1.10 mg/dL Final  01/17/2013  5:30 AM 3.34* 0.50 - 1.10 mg/dL Final  01/16/2013 11:48 PM 3.38* 0.50 - 1.10 mg/dL Final  11/17/2012  9:32 AM 2.72* 0.50 - 1.10 mg/dL Final  11/14/2012  5:10 AM 2.78* 0.50 - 1.10 mg/dL Final  11/13/2012  3:41 AM 3.12* 0.50 - 1.10 mg/dL Final  11/12/2012  3:35 AM 2.87* 0.50 - 1.10 mg/dL Final  11/11/2012 10:45 AM 2.86* 0.50 - 1.10 mg/dL Final  11/11/2012  4:00 AM 2.93* 0.50 - 1.10 mg/dL Final  11/10/2012  4:30 PM 3.22* 0.50 - 1.10 mg/dL Final  11/10/2012 10:30 AM 3.21* 0.50 - 1.10 mg/dL Final  08/01/2012  5:23 AM 2.78* 0.50 - 1.10 mg/dL Final  07/31/2012  3:35 AM 2.78* 0.50 - 1.10 mg/dL Final  07/30/2012  8:19 PM 2.84* 0.50 - 1.10 mg/dL Final  07/30/2012 12:32 PM 2.70* 0.50 - 1.10 mg/dL Final  07/30/2012  3:33 AM 2.52* 0.50 - 1.10 mg/dL Final  07/30/2012 12:19 AM 2.60* 0.50 - 1.10 mg/dL Final  07/29/2012  8:26 PM 2.70* 0.50 - 1.10 mg/dL Final  02/23/2012  4:35 AM 2.23* 0.50 - 1.10 mg/dL Final  02/22/2012  4:33 AM 2.35* 0.50 - 1.10 mg/dL Final  02/21/2012  4:44 AM 2.80* 0.50 - 1.10 mg/dL Final  02/20/2012 11:15 AM 3.03* 0.50 - 1.10 mg/dL Final  08/30/2011  5:45 PM 2.61* 0.50 - 1.10 mg/dL Final  08/29/2011  5:55 PM 2.22* 0.50 - 1.10 mg/dL Final     PMHx:   Past Medical History  Diagnosis Date  . Mental disorder   . Depression   . Hypertension   . Overdose   . Tobacco use disorder 11/13/2012  . Complication of anesthesia     difficulty going to sleep  . Chronic kidney disease     06/11/13- not on dialysis  . Shortness of breath     lying down flat  . Diabetes mellitus without complication     denies  . PTSD (post-traumatic stress  disorder)   . Asthma   . COPD (chronic obstructive pulmonary disease)   . Heart murmur   . GERD (gastroesophageal reflux disease)   . Seizures     "passsed out"  . History of blood transfusion     Past Surgical History  Procedure Laterality Date  . Right knee replacement      she says it was last year.  . Esophagogastroduodenoscopy Left 11/14/2012    Procedure: ESOPHAGOGASTRODUODENOSCOPY (EGD);  Surgeon: [name], MD;  Location: [facility];  Service: Endoscopy;  Laterality: Left;  . Joint replacement Right     knee  . Parathyroidectomy    . Av fistula placement Right 06/12/2013    Procedure: ARTERIOVENOUS (AV) FISTULA CREATION; RIGHT  BASILIC VEIN TRANSPOSITION with Intraoperative ultrasound;  Surgeon: Mal [name], MD;  Location: [facility] OR;  Service: Vascular;  Laterality: Right;    Family Hx:  Family History  Problem Relation Age of Onset  . Diabetes Mother   . Hyperlipidemia Mother   . Hypertension Mother   . Diabetes Father   . Hypertension Father   . Hyperlipidemia Father     Social History:  reports that she has been smoking Cigarettes and Cigars.  She has a 80 pack-year smoking history. She has never used smokeless tobacco. She reports that she does not drink alcohol or use illicit drugs.  Allergies:  Allergies  Allergen Reactions  . Codeine Sulfate Anaphylaxis    Daughter called about having this allergy   . Haldol [Haloperidol Lactate] Shortness Of Breath  . Risperidone And Related Shortness Of Breath    Medications: Prior to Admission medications   Medication Sig Start Date End Date Taking? Authorizing Provider  albuterol (PROAIR HFA) 108 (90 BASE) MCG/ACT inhaler Inhale 1 puff into the lungs every 4 (four) hours as needed for wheezing or shortness of breath.    Historical Provider, MD  albuterol (PROVENTIL) (2.5 MG/3ML) 0.083% nebulizer solution Take 2.5 mg by nebulization 2 (two) times daily as needed for wheezing or shortness of breath.     Historical  Provider, MD  divalproex (DEPAKOTE ER) 500 MG 24 hr tablet Take 1 tablet (500 mg total) by mouth at bedtime. 09/30/13   Nena Polio, PA-C  Fluticasone-Salmeterol (ADVAIR) 100-50 MCG/DOSE AEPB Inhale 1 puff into the lungs 2 (two) times daily. 09/30/13   Nena Polio, PA-C  furosemide (LASIX) 20 MG tablet Take 5 tablets (100 mg total) by mouth 2 (two) times daily. 09/30/13  Nena Polio, PA-C  gabapentin (NEURONTIN) 100 MG capsule Take 2 capsules (200 mg total) by mouth 2 (two) times daily. 09/30/13   Nena Polio, PA-C  hydrOXYzine (ATARAX/VISTARIL) 50 MG tablet Take 1 tablet (50 mg total) by mouth at bedtime as needed (Sleep). 09/30/13   Nena Polio, PA-C  metoprolol succinate (TOPROL-XL) 100 MG 24 hr tablet Take 1 tablet (100 mg total) by mouth at bedtime. Take with or immediately following a meal. 09/30/13   Nena Polio, PA-C  ziprasidone (GEODON) 60 MG capsule Take 1 capsule (60 mg total) by mouth 2 (two) times daily with a meal. 09/30/13   Nena Polio, PA-C    I have reviewed the patient's current medications.  Labs:  Results for orders placed during the hospital encounter of 01/15/14 (from the past 48 hour(s))  COMPREHENSIVE METABOLIC PANEL     Status: Abnormal   Collection Time    01/15/14  2:07 PM      Result Value Ref Range   Sodium 142  137 - 147 mEq/L   Potassium 5.7 (*) 3.7 - 5.3 mEq/L   Comment: HEMOLYSIS AT THIS LEVEL MAY AFFECT RESULT   Chloride 109  96 - 112 mEq/L   CO2 19  19 - 32 mEq/L   Glucose, Bld 69 (*) 70 - 99 mg/dL   BUN 53 (*) 6 - 23 mg/dL   Creatinine, Ser 4.87 (*) 0.50 - 1.10 mg/dL   Calcium 9.9  8.4 - 10.5 mg/dL   Total Protein 7.6  6.0 - 8.3 g/dL   Albumin 2.9 (*) 3.5 - 5.2 g/dL   AST 27  0 - 37 U/L   Comment: HEMOLYSIS AT THIS LEVEL MAY AFFECT RESULT   ALT 11  0 - 35 U/L   Comment: HEMOLYSIS AT THIS LEVEL MAY AFFECT RESULT   Alkaline Phosphatase 67  39 - 117 U/L   Total Bilirubin <0.2 (*) 0.3 - 1.2 mg/dL   GFR calc non Af Amer 9 (*) >90 mL/min    GFR calc Af Amer 11 (*) >90 mL/min   Comment: (NOTE)     The eGFR has been calculated using the CKD EPI equation.     This calculation has not been validated in all clinical situations.     eGFR's persistently <90 mL/min signify possible Chronic Kidney     Disease.   Anion gap 14  5 - 15  CBC WITH DIFFERENTIAL     Status: Abnormal   Collection Time    01/15/14  2:07 PM      Result Value Ref Range   WBC 6.6  4.0 - 10.5 K/uL   RBC 3.53 (*) 3.87 - 5.11 MIL/uL   Hemoglobin 10.9 (*) 12.0 - 15.0 g/dL   HCT 35.0 (*) 36.0 - 46.0 %   MCV 99.2  78.0 - 100.0 fL   MCH 30.9  26.0 - 34.0 pg   MCHC 31.1  30.0 - 36.0 g/dL   RDW 15.9 (*) 11.5 - 15.5 %   Platelets 320  150 - 400 K/uL   Neutrophils Relative % 91 (*) 43 - 77 %   Neutro Abs 6.0  1.7 - 7.7 K/uL   Lymphocytes Relative 5 (*) 12 - 46 %   Lymphs Abs 0.3 (*) 0.7 - 4.0 K/uL   Monocytes Relative 4  3 - 12 %   Monocytes Absolute 0.3  0.1 - 1.0 K/uL   Eosinophils Relative 0  0 - 5 %   Eosinophils Absolute 0.0  0.0 -  0.7 K/uL   Basophils Relative 0  0 - 1 %   Basophils Absolute 0.0  0.0 - 0.1 K/uL  ETHANOL     Status: None   Collection Time    01/15/14  2:07 PM      Result Value Ref Range   Alcohol, Ethyl (B) <11  0 - 11 mg/dL   Comment:            LOWEST DETECTABLE LIMIT FOR     SERUM ALCOHOL IS 11 mg/dL     FOR MEDICAL PURPOSES ONLY  SALICYLATE LEVEL     Status: Abnormal   Collection Time    01/15/14  2:07 PM      Result Value Ref Range   Salicylate Lvl <8.3 (*) 2.8 - 20.0 mg/dL  I-STAT ARTERIAL BLOOD GAS, ED     Status: Abnormal   Collection Time    01/15/14  2:22 PM      Result Value Ref Range   pH, Arterial 7.168 (*) 7.350 - 7.450   pCO2 arterial 58.7 (*) 35.0 - 45.0 mmHg   pO2, Arterial 64.0 (*) 80.0 - 100.0 mmHg   Bicarbonate 21.2  20.0 - 24.0 mEq/L   TCO2 23  0 - 100 mmol/L   O2 Saturation 84.0     Acid-base deficit 8.0 (*) 0.0 - 2.0 mmol/L   Patient temperature 99.5 F     Collection site RADIAL, ALLEN'S TEST  ACCEPTABLE     Drawn by RT     Sample type ARTERIAL     Comment NOTIFIED [place], ED     Status: None   Collection Time    01/15/14  2:31 PM      Result Value Ref Range   Troponin i, poc 0.08  0.00 - 0.08 ng/mL   Comment 3            Comment: Due to the release kinetics of cTnI,     a negative result within the first hours     of the onset of symptoms does not rule out     myocardial infarction with certainty.     If myocardial infarction is still suspected,     repeat the test at appropriate intervals.  I-STAT CG4 LACTIC ACID, ED     Status: None   Collection Time    01/15/14  2:33 PM      Result Value Ref Range   Lactic Acid, Venous 0.86  0.5 - 2.2 mmol/L  I-STAT CHEM 8, ED     Status: Abnormal   Collection Time    01/15/14  2:33 PM      Result Value Ref Range   Sodium 141  137 - 147 mEq/L   Potassium 5.4 (*) 3.7 - 5.3 mEq/L   Chloride 117 (*) 96 - 112 mEq/L   BUN 66 (*) 6 - 23 mg/dL   Creatinine, Ser 5.30 (*) 0.50 - 1.10 mg/dL   Glucose, Bld 70  70 - 99 mg/dL   Calcium, Ion 1.31 (*) 1.12 - 1.23 mmol/L   TCO2 22  0 - 100 mmol/L   Hemoglobin 11.9 (*) 12.0 - 15.0 g/dL   HCT 35.0 (*) 36.0 - 46.0 %  ACETAMINOPHEN LEVEL     Status: None   Collection Time    01/15/14  2:40 PM      Result Value Ref Range   Acetaminophen (Tylenol), Serum <15.0  10 - 30 ug/mL   Comment:  THERAPEUTIC CONCENTRATIONS VARY     SIGNIFICANTLY. A RANGE OF 10-30     ug/mL MAY BE AN EFFECTIVE     CONCENTRATION FOR MANY PATIENTS.     HOWEVER, SOME ARE BEST TREATED     AT CONCENTRATIONS OUTSIDE THIS     RANGE.     ACETAMINOPHEN CONCENTRATIONS     >150 ug/mL AT 4 HOURS AFTER     INGESTION AND >50 ug/mL AT 12     HOURS AFTER INGESTION ARE     OFTEN ASSOCIATED WITH TOXIC     REACTIONS.     ROS:  Review of systems not obtained due to patient factors.  Physical Exam: Filed Vitals:   01/15/14 1615  BP: 120/88  Pulse: 106  Temp:   Resp: 21     General:  Patient is intubated. She has diffuse twitching and is being seen by neurology the EEG is not showing ongoing seizures HEENT: Pupils are reacting but slow  Neck: There is no jugular venous distention and no carotid bruits Heart: Tachycardic without murmur Lungs: Coarse breath sounds bilaterally decreased breath sounds on the right Abdomen: Soft, nontender, nondistended Extremities: Minimal peripheral edema- she has a mature right upper arm AV fistula with good thrill and bruit Skin: Warm and dry Neuro: Intubated. Diffuse twitching activity and neurology is currently seeing.  Assessment/Plan: 53 year old black female with multiple medical issues including stage IV CKD. She presents with a complaint of being acutely unresponsive with respiratory failure requiring intubation. 1.Renal- known stage IV CKD. at baseline with creatinine in the fours. Creatinine may be slightly worse than baseline at this time. She is nonoliguric and putting out tons of urine. She does not seem to be significantly volume overloaded. She is however slightly hyperkalemic. There are no acute indications for dialysis at this time. We will continue to follow for need. 2. Hypertension/volume  - does not appear to be significantly volume overloaded.  Blood pressure is a little soft. I am okay with gentle hydration. 3. pulmonary  - opacification of her right-sided airspace. Started on antibiotics per CCM. Ventilator support for now. Question aspiration. Also with respiratory acidosis due to altered mental status.  4 altered mental status- appears to be of acute onset. Not consistent with uremia. History of at least 600 mg of Neurontin and history of Neurontin toxicity this could be the culprit. Other drug screen as well. 5. Hyperkalemia- not severe in nature, already trending down. I imagine hydration with the amount of urine she is making it would resolve without significant intervention. Correction of her acidosis with the vent will  help as well. We will continue to follow  Thank you for this consultation, I will continue to follow with you   Kenji Mapel A 01/15/2014, 4:50 PM

## 2014-01-15 NOTE — ED Notes (Signed)
Attempted report x1. 

## 2014-01-15 NOTE — Progress Notes (Signed)
STAT EEG ordered in the ED. Dr Armida Sans saw the patient and EEG and requested an overnight video EEG. Pt was moved to 2M09 and the LTM EEG was started. The data port in this room is not working so remote viewing is unavailable.

## 2014-01-15 NOTE — H&P (Signed)
PULMONARY / CRITICAL CARE MEDICINE   Name: Jennifer Lawson MRN: [id number] DOB: [birth year]    ADMISSION DATE:  01/15/2014 CONSULTATION DATE:  01/15/2014  REFERRING MD :  EDP  CHIEF COMPLAINT:  AMS  INITIAL PRESENTATION: 53 yo with history of seizures and psych history presented to ED 9/9 after being found down at home. Last seen normal 9/8 PM. Family states she was She was unresponsive for EMS and intubated in ED. In ED CXR shows opacification of entire R airspace. PCCM asked to see for ICU admission.   STUDIES:  9/9 CT head > no acute intracranial abnormality   SIGNIFICANT EVENTS: 9/9 intubated, admitted to ICU.   HISTORY OF PRESENT ILLNESS:  53 year old female with PMH as below, which includes ESRD, HTN, DM, seizures, psych history (depression, PTSD), asthma, and COPD. Several previous admissions, in May to [facility] for worsening depression. August was in [place] for ? Seizures after recent gabapentin dose change. She was last seen normal by family 9/8 PM. When daughter got home to check on her 9/9 AM she was unresponsive, same for EMS at arrival. Did not respond to narcan. In ED an ABG showed respiratory acidosis and she was intubated. EDP states that she had minimal purposeful movements in ED, but did not appear to be seizing. She has required no sedation since time of intubation (about 1.5 hours at this point). PCCM consulted for ICU admission.   PAST MEDICAL HISTORY :  Past Medical History  Diagnosis Date  . Mental disorder   . Depression   . Hypertension   . Overdose   . Tobacco use disorder 11/13/2012  . Complication of anesthesia     difficulty going to sleep  . Chronic kidney disease     06/11/13- not on dialysis  . Shortness of breath     lying down flat  . Diabetes mellitus without complication     denies  . PTSD (post-traumatic stress disorder)   . Asthma   . COPD (chronic obstructive pulmonary disease)   . Heart murmur   . GERD (gastroesophageal reflux  disease)   . Seizures     "passsed out"  . History of blood transfusion    Past Surgical History  Procedure Laterality Date  . Right knee replacement      she says it was last year.  . Esophagogastroduodenoscopy Left 11/14/2012    Procedure: ESOPHAGOGASTRODUODENOSCOPY (EGD);  Surgeon: [name], MD;  Location: [facility];  Service: Endoscopy;  Laterality: Left;  . Joint replacement Right     knee  . Parathyroidectomy    . Av fistula placement Right 06/12/2013    Procedure: ARTERIOVENOUS (AV) FISTULA CREATION; RIGHT  BASILIC VEIN TRANSPOSITION with Intraoperative ultrasound;  Surgeon: Mal [name], MD;  Location: [facility];  Service: Vascular;  Laterality: Right;   Prior to Admission medications   Medication Sig Start Date End Date Taking? Authorizing Provider  albuterol (PROAIR HFA) 108 (90 BASE) MCG/ACT inhaler Inhale 1 puff into the lungs every 4 (four) hours as needed for wheezing or shortness of breath.    Historical Provider, MD  albuterol (PROVENTIL) (2.5 MG/3ML) 0.083% nebulizer solution Take 2.5 mg by nebulization 2 (two) times daily as needed for wheezing or shortness of breath.     Historical Provider, MD  divalproex (DEPAKOTE ER) 500 MG 24 hr tablet Take 1 tablet (500 mg total) by mouth at bedtime. 09/30/13   Nena Polio, PA-C  Fluticasone-Salmeterol (ADVAIR) 100-50 MCG/DOSE AEPB Inhale 1 puff into  the lungs 2 (two) times daily. 09/30/13   Nena Polio, PA-C  furosemide (LASIX) 20 MG tablet Take 5 tablets (100 mg total) by mouth 2 (two) times daily. 09/30/13   Nena Polio, PA-C  gabapentin (NEURONTIN) 100 MG capsule Take 2 capsules (200 mg total) by mouth 2 (two) times daily. 09/30/13   Nena Polio, PA-C  hydrOXYzine (ATARAX/VISTARIL) 50 MG tablet Take 1 tablet (50 mg total) by mouth at bedtime as needed (Sleep). 09/30/13   Nena Polio, PA-C  metoprolol succinate (TOPROL-XL) 100 MG 24 hr tablet Take 1 tablet (100 mg total) by mouth at bedtime. Take with or immediately following  a meal. 09/30/13   Nena Polio, PA-C  ziprasidone (GEODON) 60 MG capsule Take 1 capsule (60 mg total) by mouth 2 (two) times daily with a meal. 09/30/13   Nena Polio, PA-C   Allergies  Allergen Reactions  . Codeine Sulfate Anaphylaxis    Daughter called about having this allergy   . Haldol [Haloperidol Lactate] Shortness Of Breath  . Risperidone And Related Shortness Of Breath    FAMILY HISTORY:  Family History  Problem Relation Age of Onset  . Diabetes Mother   . Hyperlipidemia Mother   . Hypertension Mother   . Diabetes Father   . Hypertension Father   . Hyperlipidemia Father    SOCIAL HISTORY:  reports that she has been smoking Cigarettes and Cigars.  She has a 80 pack-year smoking history. She has never used smokeless tobacco. She reports that she does not drink alcohol or use illicit drugs.  REVIEW OF SYSTEMS:  Unable, intubated  SUBJECTIVE:   VITAL SIGNS: Temp:  [99.5 F (37.5 C)] 99.5 F (37.5 C) (09/09 1411) Pulse Rate:  [112-118] 116 (09/09 1500) Resp:  [16-29] 16 (09/09 1538) BP: (120-150)/(77-100) 120/85 mmHg (09/09 1538) SpO2:  [88 %-100 %] 98 % (09/09 1538) FiO2 (%):  [100 %] 100 % (09/09 1500) Weight:  [88.451 kg (195 lb)] 88.451 kg (195 lb) (09/09 1452) HEMODYNAMICS:   VENTILATOR SETTINGS: Vent Mode:  [-]  FiO2 (%):  [100 %] 100 % INTAKE / OUTPUT: No intake or output data in the 24 hours ending 01/15/14 1546  PHYSICAL EXAMINATION: General:  Overweight female on vent.  Neuro:  Jerky movements, unresponsive, Pupils 87mm, sluggish.  HEENT:  [place]/AT, No JVD noted Cardiovascular:  Tachy, Regular rhythm, no MRG Lungs:  Diffuse rhonchi R > L, aerates bilateral Abdomen:  Rotund, soft, non-distended Musculoskeletal:  No acute deformity. AV fistula RUE Skin:  Intact  LABS:  CBC  Recent Labs Lab 01/15/14 1407 01/15/14 1433  WBC 6.6  --   HGB 10.9* 11.9*  HCT 35.0* 35.0*  PLT 320  --    Coag's No results found for this basename: APTT, INR,  in  the last 168 hours BMET  Recent Labs Lab 01/15/14 1407 01/15/14 1433  NA 142 141  K 5.7* 5.4*  CL 109 117*  CO2 19  --   BUN 53* 66*  CREATININE 4.87* 5.30*  GLUCOSE 69* 70   Electrolytes  Recent Labs Lab 01/15/14 1407  CALCIUM 9.9   Sepsis Markers  Recent Labs Lab 01/15/14 1433  LATICACIDVEN 0.86   ABG  Recent Labs Lab 01/15/14 1422  PHART 7.168*  PCO2ART 58.7*  PO2ART 64.0*   Liver Enzymes  Recent Labs Lab 01/15/14 1407  AST 27  ALT 11  ALKPHOS 67  BILITOT <0.2*  ALBUMIN 2.9*   Cardiac Enzymes No results found for this basename: TROPONINI, PROBNP,  in  the last 168 hours Glucose No results found for this basename: GLUCAP,  in the last 168 hours  Imaging No results found.   ASSESSMENT / PLAN:  PULMONARY OETT 9/9 >>> A:  Acute hypercarbic respiratory failure, seizure? OD? Aspiration Pneumonitis Opacification of entire R airspace (still some diffuse aeration) H/o COPD, Tobacco abuse disorder P:   Full vent support Scheduled BD's with mucomyst nebs. Increased PEEP (now 8) Increase minute ventilation Follow ABG CXR in AM, add chest pt, if not better will need FOB  CARDIOVASCULAR A:  Hemodynamically stable Tachycardia H/o HTN  P:  Hydrate Continuous cardiac monitoring Trend troponin MAP goal > 65  RENAL A:   CRF - av fistula in place, not yet on HD?? Hyperkalemia  P:   Hydrate, higher than baseline crt in our records Nephrology contacted 9/9 Repeat and trend Bmet  GASTROINTESTINAL A:   GERD  P:   SUP: IV protonix NPO, feed early  HEMATOLOGIC A:   Anemia, appears chronic, likely 2nd to renal disease  P:  Trend CBC Transfuse per normal ICU guidelines  INFECTIOUS A:   Likely aspiration PNA  P:   BCx2 9/9 >>> UC 9/9 >>> Sputum 9/9 >>> Abx: Unasyn, start date 9/9  Trend PCT to reduce or dc abx all together If spikes, or declines, add vanc  ENDOCRINE A:   DM  P:   CBG q4 Add SSI if glucose  consistently greater than 180 Check TSH  NEUROLOGIC A:  Acute encephalopathy, 2nd to ?seizure, OD, hypercapnic resp failure.  H/o depression, PTSD, psychosis H/o OD  P:   RASS goal:-1 Hold sedating meds (geodon, neurontin PAD 1 for sedation, currently requiring none.  Send lithium, valproate level Load Keppra Neuro consult eeg May need mri brain  TODAY'S SUMMARY: seizures, asp , high vent needs, may need bronch, neuro cosnult  Georgann Housekeeper, ACNP [place] [facility] Pager [phone] or [phone]   I have personally obtained a history, examined the patient, evaluated laboratory and imaging results, formulated the assessment and plan and placed orders. CRITICAL CARE: The patient is critically ill with multiple organ systems failure and requires high complexity decision making for assessment and support, frequent evaluation and titration of therapies, application of advanced monitoring technologies and extensive interpretation of multiple databases. Critical Care Time devoted to patient care services described in this note is 35 minutes.   Lavon Paganini. Titus Mould, MD, [place] Pgr: [facility]

## 2014-01-15 NOTE — Progress Notes (Signed)
Patient transported to CT and back to room without any complications. Vital signs stable at this time. RT will continue to monitor.

## 2014-01-15 NOTE — ED Notes (Signed)
Pt non-verbal. Pt withdraws from painful stimuli but no purposeful movement noted. PT warm to touch. Sinus tach on monitor. PERRLA, 69mm. No seizure like activity noted. Md Horton at bedside. Resp at bedside.

## 2014-01-15 NOTE — Progress Notes (Signed)
Patient transported to 2M09 from ED without complications. Vital signs stable. RT will continue to monitor.

## 2014-01-15 NOTE — ED Notes (Signed)
Per EMS: Pt from home. Per daughter pt was LSN last night at 0900. Daughter reports this AM she went to check on pt and found unresponsive. Pt was found on couch with foam at mouth. Pt ST 120's. Placed on NRB at 91%. Pt remained unresponsive but was noted to have purposeful movement on attempt to place a nasal trumpet. Pt given 1mg  Narcan with no improvement. Pt has shunt in right upper arm, per family she hasn't started dialysis.

## 2014-01-15 NOTE — Progress Notes (Signed)
ANTIBIOTIC CONSULT NOTE - INITIAL  Pharmacy Consult for vancomycin and Unasyn Indication: sepsis/aspiration pneumonia  Allergies  Allergen Reactions  . Codeine Sulfate Anaphylaxis    Daughter called about having this allergy   . Haldol [Haloperidol Lactate] Shortness Of Breath  . Risperidone And Related Shortness Of Breath    Patient Measurements: Height: 5' 1.81" (157 cm) Weight: 195 lb (88.451 kg) IBW/kg (Calculated) : 49.67 Adjusted Body Weight: 66.5 kg  Vital Signs: Temp: 100.6 F (38.1 C) (09/09 1610) Temp src: Core (Comment) (09/09 1610) BP: 120/88 mmHg (09/09 1615) Pulse Rate: 106 (09/09 1615) Intake/Output from previous day:   Intake/Output from this shift:    Labs:  Recent Labs  01/15/14 1407 01/15/14 1433  WBC 6.6  --   HGB 10.9* 11.9*  PLT 320  --   CREATININE 4.87* 5.30*   Estimated Creatinine Clearance: 12.5 ml/min (by C-G formula based on Cr of 5.3). No results found for this basename: VANCOTROUGH, VANCOPEAK, VANCORANDOM, GENTTROUGH, GENTPEAK, GENTRANDOM, TOBRATROUGH, TOBRAPEAK, TOBRARND, AMIKACINPEAK, AMIKACINTROU, AMIKACIN,  in the last 72 hours   Microbiology: No results found for this or any previous visit (from the past 720 hour(s)).  Medical History: Past Medical History  Diagnosis Date  . Mental disorder   . Depression   . Hypertension   . Overdose   . Tobacco use disorder 11/13/2012  . Complication of anesthesia     difficulty going to sleep  . Chronic kidney disease     06/11/13- not on dialysis  . Shortness of breath     lying down flat  . Diabetes mellitus without complication     denies  . PTSD (post-traumatic stress disorder)   . Asthma   . COPD (chronic obstructive pulmonary disease)   . Heart murmur   . GERD (gastroesophageal reflux disease)   . Seizures     "passsed out"  . History of blood transfusion     Assessment: 9 YOF who was found unresponsive at home and being brought to the ED and being intubated. Pharmacy  is consulted to start vancomycin and unasyn empirically for sepsis and aspiration pneumonia. Pt. Has ESRD, but per family she hasn't started HD yet, she does have av fistula in place. She received 1 dose of zosyn 3.375g in the ED at 1555. Temp 100.6, wbc wnl. Est. crcl ~ 10-15 ml/min. Urine and blood cultures are inprocess.  Goal of Therapy:  Vancomycin trough level 15-20 mcg/ml  Plan:  - vancomycin loading dose 1500 mg IV x 1, then 1000mg  IV Q 48 hrs - unasyn 3g IV Q 24 hrs - f/u cultures - f/u Plans for HD - vancomycin trough when indicated.  Maryanna Shape, PharmD, BCPS  Clinical Pharmacist  Pager: [phone]   01/15/2014,4:36 PM

## 2014-01-15 NOTE — ED Notes (Signed)
Pt returned to room  

## 2014-01-15 NOTE — ED Notes (Signed)
Family at bedside. 

## 2014-01-15 NOTE — ED Provider Notes (Signed)
CSN: [id number]     Arrival date & time 01/15/14  1402 History   First MD Initiated Contact with Patient 01/15/14 1414     Chief Complaint  Patient presents with  . Altered Mental Status     (Consider location/radiation/quality/duration/timing/severity/associated sxs/prior Treatment) HPI  This is a 53 year old female with a history of depression, bipolar disorder, chronic kidney disease, diabetes, COPD who presents with altered mental status.  Per EMS report, patient was found unresponsive by her daughter. She was last seen last night at approximately 9:30 PM. Daughter had to break into the house this morning and found her on the couch. She is responsive only to painful stimuli. She did not respond to Narcan.  The patient noted to be tachycardic in the 120s in route and 91% on a nonrebreather.  Level V caveat for altered mental status.  Past Medical History  Diagnosis Date  . Mental disorder   . Depression   . Hypertension   . Overdose   . Tobacco use disorder 11/13/2012  . Complication of anesthesia     difficulty going to sleep  . Chronic kidney disease     06/11/13- not on dialysis  . Shortness of breath     lying down flat  . Diabetes mellitus without complication     denies  . PTSD (post-traumatic stress disorder)   . Asthma   . COPD (chronic obstructive pulmonary disease)   . Heart murmur   . GERD (gastroesophageal reflux disease)   . Seizures     "passsed out"  . History of blood transfusion    Past Surgical History  Procedure Laterality Date  . Right knee replacement      she says it was last year.  . Esophagogastroduodenoscopy Left 11/14/2012    Procedure: ESOPHAGOGASTRODUODENOSCOPY (EGD);  Surgeon: [name], MD;  Location: [facility];  Service: Endoscopy;  Laterality: Left;  . Joint replacement Right     knee  . Parathyroidectomy    . Av fistula placement Right 06/12/2013    Procedure: ARTERIOVENOUS (AV) FISTULA CREATION; RIGHT  BASILIC VEIN TRANSPOSITION with  Intraoperative ultrasound;  Surgeon: Mal [name], MD;  Location: [facility];  Service: Vascular;  Laterality: Right;   Family History  Problem Relation Age of Onset  . Diabetes Mother   . Hyperlipidemia Mother   . Hypertension Mother   . Diabetes Father   . Hypertension Father   . Hyperlipidemia Father    History  Substance Use Topics  . Smoking status: Current Every Day Smoker -- 2.00 packs/day for 40 years    Types: Cigarettes, Cigars  . Smokeless tobacco: Never Used  . Alcohol Use: No     Comment: none in over a month per daughter   OB History   Grav Para Term Preterm Abortions TAB SAB Ect Mult Living                 Review of Systems  Unable to perform ROS: Mental status change      Allergies  Codeine sulfate; Haldol; and Risperidone and related  Home Medications   Prior to Admission medications   Medication Sig Start Date End Date Taking? Authorizing Provider  albuterol (PROAIR HFA) 108 (90 BASE) MCG/ACT inhaler Inhale 1 puff into the lungs every 4 (four) hours as needed for wheezing or shortness of breath.    Historical Provider, MD  albuterol (PROVENTIL) (2.5 MG/3ML) 0.083% nebulizer solution Take 2.5 mg by nebulization 2 (two) times daily as needed for  wheezing or shortness of breath.     Historical Provider, MD  divalproex (DEPAKOTE ER) 500 MG 24 hr tablet Take 1 tablet (500 mg total) by mouth at bedtime. 09/30/13   Nena Polio, PA-C  Fluticasone-Salmeterol (ADVAIR) 100-50 MCG/DOSE AEPB Inhale 1 puff into the lungs 2 (two) times daily. 09/30/13   Nena Polio, PA-C  furosemide (LASIX) 20 MG tablet Take 5 tablets (100 mg total) by mouth 2 (two) times daily. 09/30/13   Nena Polio, PA-C  gabapentin (NEURONTIN) 100 MG capsule Take 2 capsules (200 mg total) by mouth 2 (two) times daily. 09/30/13   Nena Polio, PA-C  hydrOXYzine (ATARAX/VISTARIL) 50 MG tablet Take 1 tablet (50 mg total) by mouth at bedtime as needed (Sleep). 09/30/13   Nena Polio, PA-C  metoprolol  succinate (TOPROL-XL) 100 MG 24 hr tablet Take 1 tablet (100 mg total) by mouth at bedtime. Take with or immediately following a meal. 09/30/13   Nena Polio, PA-C  ziprasidone (GEODON) 60 MG capsule Take 1 capsule (60 mg total) by mouth 2 (two) times daily with a meal. 09/30/13   Nena Polio, PA-C   BP 120/85  Pulse 110  Temp(Src) 99.5 F (37.5 C) (Rectal)  Resp 16  Ht 5' 1.81" (1.57 m)  Wt 195 lb (88.451 kg)  BMI 35.88 kg/m2  SpO2 98%  LMP 05/09/2008 Physical Exam  Nursing note and vitals reviewed. Constitutional:  Unresponsive, localizes to painful stimuli  HENT:  Head: Normocephalic and atraumatic.  Pupils 2 mm and reactive bilaterally  Eyes: Pupils are equal, round, and reactive to light.  Neck: Neck supple.  Cardiovascular: Regular rhythm and normal heart sounds.   Tachycardia  Pulmonary/Chest: Effort normal. No respiratory distress. She has no wheezes.  Decreased breath sounds right lower lobe with rales  Abdominal: Soft. Bowel sounds are normal. There is no tenderness. There is no rebound.  Musculoskeletal: She exhibits edema.  Dialysis access in the right upper extremity  Neurological: GCS eye subscore is 1. GCS verbal subscore is 2. GCS motor subscore is 5.  Skin: Skin is warm and dry.  Psychiatric:  Unable to assess    ED Course  Procedures (including critical care time)  CRITICAL CARE Performed by: Thayer Jew, F   Total critical care time: 45 min  Critical care time was exclusive of separately billable procedures and treating other patients.  Critical care was necessary to treat or prevent imminent or life-threatening deterioration.  Critical care was time spent personally by me on the following activities: development of treatment plan with patient and/or surrogate as well as nursing, discussions with consultants, evaluation of patient's response to treatment, examination of patient, obtaining history from patient or surrogate, ordering and  performing treatments and interventions, ordering and review of laboratory studies, ordering and review of radiographic studies, pulse oximetry and re-evaluation of patient's condition.  INTUBATION Performed by: Thayer Jew, F  Required items: required blood products, implants, devices, and special equipment available Patient identity confirmed: provided demographic data and hospital-assigned identification number Time out: Immediately prior to procedure a "time out" was called to verify the correct patient, procedure, equipment, support staff and site/side marked as required.  Indications: Hypoxic respiratory failure, altered mental status   Intubation method: Glidescope Laryngoscopy   Preoxygenation: BVM  Sedatives: Etomidate Paralytic: Rocuronium  Tube Size: 7.5 cuffed  Post-procedure assessment: chest rise and ETCO2 monitor Breath sounds: equal and absent over the epigastrium Tube secured with: ETT holder Chest x-ray interpreted by radiologist and me.  Chest x-ray findings:  endotracheal tube in appropriate position  Patient tolerated the procedure well with no immediate complications.    Labs Review Labs Reviewed  COMPREHENSIVE METABOLIC PANEL - Abnormal; Notable for the following:    Potassium 5.7 (*)    Glucose, Bld 69 (*)    BUN 53 (*)    Creatinine, Ser 4.87 (*)    Albumin 2.9 (*)    Total Bilirubin <0.2 (*)    GFR calc non Af Amer 9 (*)    GFR calc Af Amer 11 (*)    All other components within normal limits  CBC WITH DIFFERENTIAL - Abnormal; Notable for the following:    RBC 3.53 (*)    Hemoglobin 10.9 (*)    HCT 35.0 (*)    RDW 15.9 (*)    Neutrophils Relative % 91 (*)    Lymphocytes Relative 5 (*)    Lymphs Abs 0.3 (*)    All other components within normal limits  SALICYLATE LEVEL - Abnormal; Notable for the following:    Salicylate Lvl <3.2 (*)    All other components within normal limits  I-STAT CHEM 8, ED - Abnormal; Notable for the following:     Potassium 5.4 (*)    Chloride 117 (*)    BUN 66 (*)    Creatinine, Ser 5.30 (*)    Calcium, Ion 1.31 (*)    Hemoglobin 11.9 (*)    HCT 35.0 (*)    All other components within normal limits  I-STAT ARTERIAL BLOOD GAS, ED - Abnormal; Notable for the following:    pH, Arterial 7.168 (*)    pCO2 arterial 58.7 (*)    pO2, Arterial 64.0 (*)    Acid-base deficit 8.0 (*)    All other components within normal limits  CULTURE, BLOOD (ROUTINE X 2)  CULTURE, BLOOD (ROUTINE X 2)  URINE CULTURE  ETHANOL  ACETAMINOPHEN LEVEL  URINALYSIS, ROUTINE W REFLEX MICROSCOPIC  URINE RAPID DRUG SCREEN (HOSP PERFORMED)  URINALYSIS, ROUTINE W REFLEX MICROSCOPIC  I-STAT CG4 LACTIC ACID, ED  I-STAT TROPOININ, ED    Imaging Review Ct Head Wo Contrast  01/15/2014   CLINICAL DATA:  Unresponsive  EXAM: CT HEAD WITHOUT CONTRAST  TECHNIQUE: Contiguous axial images were obtained from the base of the skull through the vertex without intravenous contrast.  COMPARISON:  09/21/2013  FINDINGS: Paranasal sinuses are well aerated. A small amount of mucosal thickening is noted within the maxillary antrum. No acute bony abnormality is seen. The ventricles are normal in size and configuration. No findings to suggest acute hemorrhage, acute infarction or space-occupying mass lesion are noted.  IMPRESSION: Sinus changes as described.  No acute abnormality noted.   Electronically Signed   By: Inez Catalina M.D.   On: 01/15/2014 15:26   Dg Chest Port 1 View  01/15/2014   CLINICAL DATA:  ENDOTRACHEAL TUBE PLACEMENT.  UNRESPONSIVE.  EXAM: PORTABLE CHEST - 1 VIEW  COMPARISON:  09/1913  FINDINGS: 1440 HRS. Endotracheal tube tip is 4.7 cm above the base of the carina. NG tube tip overlies the mid stomach. Left lung is clear. There is diffuse airspace disease and volume loss in the right lung. Telemetry leads overlie the chest.  IMPRESSION: Collapse/consolidation in the right lung.   Electronically Signed   By: [name] Stanley M.D.   On:  01/15/2014 15:04     EKG Interpretation   Date/Time:  Wednesday January 15 2014 14:08:01 EDT Ventricular Rate:  116 PR Interval:  172 QRS Duration: 90 QT Interval:  313 QTC Calculation: 435 R Axis:   37 Text Interpretation:  Sinus tachycardia Ventricular premature complex LAE,  consider biatrial enlargement Confirmed by HORTON  MD, [place] ([id number]) on  01/15/2014 3:42:51 PM      MDM   Final diagnoses:  Altered mental status, unspecified altered mental status type  Acute respiratory failure with hypoxia    Patient presents with altered mental status. She localizes only to pain. She is afebrile but tachycardic and tachypnea. Will initiate sepsis workup as well as tox screen. Review of patient's chart reveals slight history and history of overdose. The patient did not respond to 2 mg of Narcan. ABG shows pH of 7.168, PCO2 is 58.7, and PO2 of 64. Patient was intubated for hypoxic respiratory failure. X-ray with evidence of right lung consolidation.  Patient given vancomycin and Zosyn. She does not have any hypotension and does appear volume overloaded to some degree. She is given 500 cc bolus. Lactate is normal. Critical care to evaluate for admission.    Merryl Hacker, MD 01/15/14 [phone]

## 2014-01-15 NOTE — ED Notes (Signed)
EEG at bedside. Oral suction performed frequently. Pt noted to have clear frothy sputum.

## 2014-01-15 NOTE — ED Notes (Signed)
Pt resting comfortably in bed. No movement noted at this time. VSS.

## 2014-01-15 NOTE — Consult Note (Signed)
NEURO HOSPITALIST CONSULT NOTE    Reason for Consult: AMS  HPI:                                                                                                                                          Jennifer Lawson is an 53 y.o. female who was brought to ED after daughter found her unresponsive, left arm in flexion and foaming at the mouth.  Due to inability to protect airway she was intubated in ED. Per daughter, she was acting her normal self this AM at 0900.  When she returned to the house at 1200 she was found as described above.  Her daughter notes she took Neurontin 600 mg pill last night (She was actually taken off this medication one month ago due to a similar event occuring where she had jerking and AMS). Once taken off the Neurontin she seemed to clear over a period of three days. Daughter denies any significant drinking or other drug abuse. Currently patient is intubated and showing multifocal myoclonus.   Past Medical History  Diagnosis Date  . Mental disorder   . Depression   . Hypertension   . Overdose   . Tobacco use disorder 11/13/2012  . Complication of anesthesia     difficulty going to sleep  . Chronic kidney disease     06/11/13- not on dialysis  . Shortness of breath     lying down flat  . Diabetes mellitus without complication     denies  . PTSD (post-traumatic stress disorder)   . Asthma   . COPD (chronic obstructive pulmonary disease)   . Heart murmur   . GERD (gastroesophageal reflux disease)   . Seizures     "passsed out"  . History of blood transfusion     Past Surgical History  Procedure Laterality Date  . Right knee replacement      she says it was last year.  . Esophagogastroduodenoscopy Left 11/14/2012    Procedure: ESOPHAGOGASTRODUODENOSCOPY (EGD);  Surgeon: [name], MD;  Location: [facility];  Service: Endoscopy;  Laterality: Left;  . Joint replacement Right     knee  . Parathyroidectomy    . Av fistula placement  Right 06/12/2013    Procedure: ARTERIOVENOUS (AV) FISTULA CREATION; RIGHT  BASILIC VEIN TRANSPOSITION with Intraoperative ultrasound;  Surgeon: Mal [name], MD;  Location: [facility] OR;  Service: Vascular;  Laterality: Right;    Family History  Problem Relation Age of Onset  . Diabetes Mother   . Hyperlipidemia Mother   . Hypertension Mother   . Diabetes Father   . Hypertension Father   . Hyperlipidemia Father      Social History:  reports that she has been smoking Cigarettes and Cigars.  She has a 80 pack-year smoking  history. She has never used smokeless tobacco. She reports that she does not drink alcohol or use illicit drugs.  Allergies  Allergen Reactions  . Codeine Sulfate Anaphylaxis    Daughter called about having this allergy   . Haldol [Haloperidol Lactate] Shortness Of Breath  . Risperidone And Related Shortness Of Breath    MEDICATIONS:                                                                                                                     Current Facility-Administered Medications  Medication Dose Route Frequency Provider Last Rate Last Dose  . 0.9 %  sodium chloride infusion  250 mL Intravenous PRN Marijean Heath, NP      . 0.9 %  sodium chloride infusion   Intravenous Continuous Marijean Heath, NP 50 mL/hr at 01/15/14 1618    . acetylcysteine (MUCOMYST) 20 % nebulizer / oral solution 4 mL  4 mL Nebulization TID Marijean Heath, NP      . albuterol (PROVENTIL) (2.5 MG/3ML) 0.083% nebulizer solution 2.5 mg  2.5 mg Nebulization Q2H PRN Marijean Heath, NP      . Ampicillin-Sulbactam (UNASYN) 3 g in sodium chloride 0.9 % 100 mL IVPB  3 g Intravenous Q24H Manley Mason, RPH      . fentaNYL (SUBLIMAZE) 0.05 MG/ML injection           . fentaNYL (SUBLIMAZE) injection 50 mcg  50 mcg Intravenous Q30 min PRN Merryl Hacker, MD      . ipratropium-albuterol (DUONEB) 0.5-2.5 (3) MG/3ML nebulizer solution 3 mL  3 mL Nebulization Q6H Marijean Heath, NP      . levETIRAcetam (KEPPRA) IVPB 1500 mg/ 100 mL premix  1,500 mg Intravenous Once Marijean Heath, NP      . lidocaine (cardiac) 100 mg/85ml (XYLOCAINE) 20 MG/ML injection 2%           . pantoprazole (PROTONIX) injection 40 mg  40 mg Intravenous QHS Marijean Heath, NP      . rocuronium (ZEMURON) 50 MG/5ML injection           . succinylcholine (ANECTINE) 20 MG/ML injection           . vancomycin (VANCOCIN) 1,500 mg in sodium chloride 0.9 % 500 mL IVPB  1,500 mg Intravenous Once Manley Mason, [facility]       Current Outpatient Prescriptions  Medication Sig Dispense Refill  . albuterol (PROAIR HFA) 108 (90 BASE) MCG/ACT inhaler Inhale 1 puff into the lungs every 4 (four) hours as needed for wheezing or shortness of breath.      Marland Kitchen albuterol (PROVENTIL) (2.5 MG/3ML) 0.083% nebulizer solution Take 2.5 mg by nebulization 2 (two) times daily as needed for wheezing or shortness of breath.       . divalproex (DEPAKOTE ER) 500 MG 24 hr tablet Take 1 tablet (500 mg total) by mouth at bedtime.  30 tablet  0  . Fluticasone-Salmeterol (ADVAIR) 100-50 MCG/DOSE AEPB Inhale  1 puff into the lungs 2 (two) times daily.  60 each    . furosemide (LASIX) 20 MG tablet Take 5 tablets (100 mg total) by mouth 2 (two) times daily.  50 tablet  0  . gabapentin (NEURONTIN) 100 MG capsule Take 2 capsules (200 mg total) by mouth 2 (two) times daily.  120 capsule  0  . hydrOXYzine (ATARAX/VISTARIL) 50 MG tablet Take 1 tablet (50 mg total) by mouth at bedtime as needed (Sleep).  30 tablet  0  . metoprolol succinate (TOPROL-XL) 100 MG 24 hr tablet Take 1 tablet (100 mg total) by mouth at bedtime. Take with or immediately following a meal.  30 tablet  0  . ziprasidone (GEODON) 60 MG capsule Take 1 capsule (60 mg total) by mouth 2 (two) times daily with a meal.  60 capsule  0      ROS:                                                                                                                                        History obtained from unobtainable from patient due to mental status  General ROS: negative for - chills, fatigue, fever, night sweats, weight gain or weight loss Psychological ROS: negative for - behavioral disorder, hallucinations, memory difficulties, mood swings or suicidal ideation Ophthalmic ROS: negative for - blurry vision, double vision, eye pain or loss of vision ENT ROS: negative for - epistaxis, nasal discharge, oral lesions, sore throat, tinnitus or vertigo Allergy and Immunology ROS: negative for - hives or itchy/watery eyes Hematological and Lymphatic ROS: negative for - bleeding problems, bruising or swollen lymph nodes Endocrine ROS: negative for - galactorrhea, hair pattern changes, polydipsia/polyuria or temperature intolerance Respiratory ROS: negative for - cough, hemoptysis, shortness of breath or wheezing Cardiovascular ROS: negative for - chest pain, dyspnea on exertion, edema or irregular heartbeat Gastrointestinal ROS: negative for - abdominal pain, diarrhea, hematemesis, nausea/vomiting or stool incontinence Genito-Urinary ROS: negative for - dysuria, hematuria, incontinence or urinary frequency/urgency Musculoskeletal ROS: negative for - joint swelling or muscular weakness Neurological ROS: as noted in HPI Dermatological ROS: negative for rash and skin lesion changes   Blood pressure 121/83, pulse 109, temperature 100.6 F (38.1 C), temperature source Core (Comment), resp. rate 34, height 5' 1.81" (1.57 m), weight 88.451 kg (195 lb), last menstrual period 05/09/2008, SpO2 99.00%.   Neurologic Examination:  Mental Status: Patient does not respond to verbal stimuli.  Does not respond to deep sternal rub.  Does not follow commands.  No verbalizations noted.  Cranial Nerves: II: patient does not respond confrontation bilaterally, pupils right 2 mm, left 2  mm,and reactive bilaterally III,IV,VI: doll's response absent bilaterally.  V,VII: corneal reflex absent bilaterally  VIII: patient does not respond to verbal stimuli IX,X: gag reflex present, XI: trapezius strength unable to test bilaterally XII: tongue strength unable to test Motor: Extremities flaccid throughout.  No spontaneous movement noted.  No purposeful movements noted. Sensory: Does not respond to noxious stimuli in any extremity. Deep Tendon Reflexes:  1+ bilateral UE and KJ. Plantars: equivocal bilaterally Cerebellar: Unable to perform    Lab Results: Basic Metabolic Panel:  Recent Labs Lab 01/15/14 1407 01/15/14 1433  NA 142 141  K 5.7* 5.4*  CL 109 117*  CO2 19  --   GLUCOSE 69* 70  BUN 53* 66*  CREATININE 4.87* 5.30*  CALCIUM 9.9  --     Liver Function Tests:  Recent Labs Lab 01/15/14 1407  AST 27  ALT 11  ALKPHOS 67  BILITOT <0.2*  PROT 7.6  ALBUMIN 2.9*   No results found for this basename: LIPASE, AMYLASE,  in the last 168 hours No results found for this basename: AMMONIA,  in the last 168 hours  CBC:  Recent Labs Lab 01/15/14 1407 01/15/14 1433  WBC 6.6  --   NEUTROABS 6.0  --   HGB 10.9* 11.9*  HCT 35.0* 35.0*  MCV 99.2  --   PLT 320  --     Cardiac Enzymes: No results found for this basename: CKTOTAL, CKMB, CKMBINDEX, TROPONINI,  in the last 168 hours  Lipid Panel: No results found for this basename: CHOL, TRIG, HDL, CHOLHDL, VLDL, LDLCALC,  in the last 168 hours  CBG: No results found for this basename: GLUCAP,  in the last 168 hours  Microbiology: Results for orders placed during the hospital encounter of 09/21/13  URINE CULTURE     Status: None   Collection Time    09/21/13  1:40 PM      Result Value Ref Range Status   Specimen Description URINE, CLEAN CATCH   Final   Special Requests NONE   Final   Culture  Setup Time     Final   Value: 09/22/2013 16:13     Performed at [facility]      Final   Value: NO GROWTH     Performed at [facility]   Culture     Final   Value: NO GROWTH     Performed at [facility]   Report Status 09/23/2013 FINAL   Final    Coagulation Studies: No results found for this basename: LABPROT, INR,  in the last 72 hours  Imaging: Ct Head Wo Contrast  01/15/2014   CLINICAL DATA:  Unresponsive  EXAM: CT HEAD WITHOUT CONTRAST  TECHNIQUE: Contiguous axial images were obtained from the base of the skull through the vertex without intravenous contrast.  COMPARISON:  09/21/2013  FINDINGS: Paranasal sinuses are well aerated. A small amount of mucosal thickening is noted within the maxillary antrum. No acute bony abnormality is seen. The ventricles are normal in size and configuration. No findings to suggest acute hemorrhage, acute infarction or space-occupying mass lesion are noted.  IMPRESSION: Sinus changes as described.  No acute abnormality noted.   Electronically Signed   By:  Inez Catalina M.D.   On: 01/15/2014 15:26   Dg Chest Port 1 View  01/15/2014   CLINICAL DATA:  ENDOTRACHEAL TUBE PLACEMENT.  UNRESPONSIVE.  EXAM: PORTABLE CHEST - 1 VIEW  COMPARISON:  09/1913  FINDINGS: 1440 HRS. Endotracheal tube tip is 4.7 cm above the base of the carina. NG tube tip overlies the mid stomach. Left lung is clear. There is diffuse airspace disease and volume loss in the right lung. Telemetry leads overlie the chest.  IMPRESSION: Collapse/consolidation in the right lung.   Electronically Signed   By: [name] Stanley M.D.   On: 01/15/2014 15:04       Assessment and plan per attending neurologist  Etta Quill PA-C [facility] [phone]  01/15/2014, 4:17 PM   Assessment/Plan: 53 YO female with known chronic kidney disease but not yet on dialysis. Patient found unresponsive, foaming at the mouth and left arm flexed. Currently she is unresponsive, intubated, and exhibiting myoclonic like movements left shoulder and neck . Per daughter  patient took Neurontin last night (although she was directed not to take this medication secondary to similar response one month ago). Etiology of AMS unclear however given renal failure and large dose of Neurontin cannot exclude gabapentin toxicity which is known to induce myoclonus and altered mental status . Other differentials include myoclonic seizures versus embolic shower.  Recommend: 1) EEG 2) D/C Neurontin 3) MRI brain when stable Will follow up.

## 2014-01-16 ENCOUNTER — Inpatient Hospital Stay (HOSPITAL_COMMUNITY): Payer: Medicare Other

## 2014-01-16 DIAGNOSIS — N186 End stage renal disease: Secondary | ICD-10-CM

## 2014-01-16 LAB — URINE CULTURE
Colony Count: NO GROWTH
Culture: NO GROWTH

## 2014-01-16 LAB — BLOOD GAS, ARTERIAL
Acid-base deficit: 9.5 mmol/L — ABNORMAL HIGH (ref 0.0–2.0)
Bicarbonate: 16.8 mEq/L — ABNORMAL LOW (ref 20.0–24.0)
Drawn by: 347621
FIO2: 0.4 %
MECHVT: 440 mL
O2 Saturation: 96.2 %
PEEP: 10 cmH2O
Patient temperature: 100.5
RATE: 26 resp/min
TCO2: 18.1 mmol/L (ref 0–100)
pCO2 arterial: 44.9 mmHg (ref 35.0–45.0)
pH, Arterial: 7.205 — ABNORMAL LOW (ref 7.350–7.450)
pO2, Arterial: 93.7 mmHg (ref 80.0–100.0)

## 2014-01-16 LAB — BASIC METABOLIC PANEL
Anion gap: 16 — ABNORMAL HIGH (ref 5–15)
BUN: 60 mg/dL — ABNORMAL HIGH (ref 6–23)
CO2: 16 mEq/L — ABNORMAL LOW (ref 19–32)
Calcium: 9.8 mg/dL (ref 8.4–10.5)
Chloride: 110 mEq/L (ref 96–112)
Creatinine, Ser: 5.67 mg/dL — ABNORMAL HIGH (ref 0.50–1.10)
GFR calc Af Amer: 9 mL/min — ABNORMAL LOW (ref >90)
GFR calc non Af Amer: 8 mL/min — ABNORMAL LOW (ref >90)
Glucose, Bld: 113 mg/dL — ABNORMAL HIGH (ref 70–99)
Potassium: 4.7 mEq/L (ref 3.7–5.3)
Sodium: 142 mEq/L (ref 137–147)

## 2014-01-16 LAB — GLUCOSE, CAPILLARY
Glucose-Capillary: 103 mg/dL — ABNORMAL HIGH (ref 70–99)
Glucose-Capillary: 104 mg/dL — ABNORMAL HIGH (ref 70–99)
Glucose-Capillary: 104 mg/dL — ABNORMAL HIGH (ref 70–99)
Glucose-Capillary: 109 mg/dL — ABNORMAL HIGH (ref 70–99)
Glucose-Capillary: 111 mg/dL — ABNORMAL HIGH (ref 70–99)
Glucose-Capillary: 111 mg/dL — ABNORMAL HIGH (ref 70–99)
Glucose-Capillary: 68 mg/dL — ABNORMAL LOW (ref 70–99)

## 2014-01-16 LAB — CBC
HCT: 30.5 % — ABNORMAL LOW (ref 36.0–46.0)
Hemoglobin: 9.6 g/dL — ABNORMAL LOW (ref 12.0–15.0)
MCH: 29.9 pg (ref 26.0–34.0)
MCHC: 31.5 g/dL (ref 30.0–36.0)
MCV: 95 fL (ref 78.0–100.0)
Platelets: 302 10*3/uL (ref 150–400)
RBC: 3.21 MIL/uL — ABNORMAL LOW (ref 3.87–5.11)
RDW: 15.8 % — ABNORMAL HIGH (ref 11.5–15.5)
WBC: 22.7 10*3/uL — ABNORMAL HIGH (ref 4.0–10.5)

## 2014-01-16 LAB — TRIGLYCERIDES: Triglycerides: 173 mg/dL — ABNORMAL HIGH (ref <150)

## 2014-01-16 MED ORDER — PRO-STAT SUGAR FREE PO LIQD
30.0000 mL | Freq: Four times a day (QID) | ORAL | Status: DC
  Administered 2014-01-16 – 2014-01-18 (×10): 30 mL
  Filled 2014-01-16 (×14): qty 30

## 2014-01-16 MED ORDER — VITAL HIGH PROTEIN PO LIQD
1000.0000 mL | ORAL | Status: DC
  Filled 2014-01-16 (×2): qty 1000

## 2014-01-16 MED ORDER — METOPROLOL TARTRATE 1 MG/ML IV SOLN: 2.5000 mg | INTRAVENOUS | Status: DC | PRN

## 2014-01-16 MED ORDER — PRO-STAT SUGAR FREE PO LIQD
30.0000 mL | Freq: Two times a day (BID) | ORAL | Status: DC
  Filled 2014-01-16 (×2): qty 30

## 2014-01-16 MED ORDER — FAMOTIDINE 40 MG/5ML PO SUSR
20.0000 mg | Freq: Every day | ORAL | Status: DC
  Administered 2014-01-16 – 2014-01-19 (×4): 20 mg
  Filled 2014-01-16 (×5): qty 2.5

## 2014-01-16 MED ORDER — DARBEPOETIN ALFA-POLYSORBATE 100 MCG/0.5ML IJ SOLN
100.0000 ug | INTRAMUSCULAR | Status: DC
  Administered 2014-01-16: 100 ug via SUBCUTANEOUS
  Filled 2014-01-16: qty 0.5

## 2014-01-16 MED ORDER — DEXTROSE 5 % IV SOLN
INTRAVENOUS | Status: DC
  Administered 2014-01-16 (×2): via INTRAVENOUS
  Filled 2014-01-16 (×4): qty 150

## 2014-01-16 MED ORDER — INFLUENZA VAC SPLIT QUAD 0.5 ML IM SUSY
0.5000 mL | PREFILLED_SYRINGE | INTRAMUSCULAR | Status: AC
  Administered 2014-01-17: 0.5 mL via INTRAMUSCULAR
  Filled 2014-01-16: qty 0.5

## 2014-01-16 MED ORDER — VITAL HIGH PROTEIN PO LIQD
1000.0000 mL | ORAL | Status: DC
  Administered 2014-01-16 – 2014-01-18 (×3): 1000 mL
  Administered 2014-01-18: 06:00:00
  Filled 2014-01-16 (×7): qty 1000

## 2014-01-16 NOTE — Progress Notes (Signed)
Sputum culture collected and sent to lab w/ label and requisition.

## 2014-01-16 NOTE — Procedures (Signed)
Electroencephalogram report- LTM  Ordering Physician : Dr. Aram Beecham EEG number: [id number]     Beginning date or time: 01/15/2014 5:35PM Ending date or time: 01/16/2014 9:32 AM  Day of study: day 1  Medications include: Per EMR  MENTAL STATUS (per technician's notes): Intubated. Sedated.  HISTORY: This 24 hours of intensive EEG monitoring with simultaneous video monitoring was performed for this patient with Subdural hemorrhage and altered mental status. This EEG was requested to rule out subclinical electrographic seizures.  TECHNICAL DESCRIPTION:  The study consists of a continuous 16-channel multi-montage digital video EEG recording with twenty-one electrodes placed according to the International 10-20 System. Additional leads included eye leads, true temporal leads (T1, T2), and an EKG lead. Activation procedures were not done due to mental status.  REPORT: The background activity in this tracing consisted of polymorphic delta and theta background. Some overriding faster frequencies, often in the morphology of spindles was seen, likely secondary to medicines. No focal slowing or epileptiform activity was identified.  IMPRESSION: This is an abnormal EEG due to: Diffuse slowing.  CLINICAL CORRELATION: This EEG is consistent with diffuse non-specific cerebral dysfunction, and has features consistent with anesthetic sedation. No electrographic seizures were seen.

## 2014-01-16 NOTE — Plan of Care (Signed)
Problem: Consults Goal: General Medical Patient Education See Patient Education Module for specific education. Outcome: Completed/Met Date Met:  01/16/14 Pt's dtr Anastasia's education assessment completed; pt sedated on ventilator

## 2014-01-16 NOTE — Progress Notes (Signed)
Subjective:  Febrile overnight- UOP was great initially - has slowed down some Objective Vital signs in last 24 hours: Filed Vitals:   01/16/14 0426 01/16/14 0500 01/16/14 0600 01/16/14 0700  BP:  132/84 123/74 109/69  Pulse:  120 121 117  Temp:  100.6 F (38.1 C) 100.5 F (38.1 C) 100.5 F (38.1 C)  TempSrc:      Resp:  26 26 26   Height:      Weight: 82 kg (180 lb 12.4 oz)     SpO2:  99% 100% 100%   Weight change:   Intake/Output Summary (Last 24 hours) at 01/16/14 0742 Last data filed at 01/16/14 0700  Gross per 24 hour  Intake   2719 ml  Output   1430 ml  Net   1289 ml    Assessment/Plan: 53 year old black female with multiple medical issues including stage IV CKD. She presents with a complaint of being acutely unresponsive with respiratory failure requiring intubation.  1.Renal- known stage IV CKD at baseline with creatinine in the fours. Creatinine worse than baseline at this time. She was nonoliguric now UOP has slowed. There are no acute indications for dialysis, but if she continues to have marginal UOP and numbers worse tomorrow- since we have an access that appears functional and she was already so close it may be best to just go ahead and initiate dialysis as it may improve her recovery.  2. Hypertension/volume - does not appear to be significantly volume overloaded. Blood pressure is a little soft. Given acidosis will change hydration to bicarb based fluids 3. pulmonary - opacification of her right-sided airspace. Now fever. Started on antibiotics per CCM. Ventilator support for now. Question aspiration. Also with respiratory acidosis due to altered mental status.  4 altered mental status- appears to be of acute onset. Not consistent with uremia. History of at least 600 mg of Neurontin and history of Neurontin toxicity this could be the culprit. Other drug screen as well.  5. Hyperkalemia- better 6. Acidosis - both resp and metabolic- will give bicarb 7. Anemia- hgb has  decreased with events- give aranesp and check iron stores    [place] A    Labs: Basic Metabolic Panel:  Recent Labs Lab 01/15/14 1407 01/15/14 1433 01/15/14 1957 01/16/14 0450  NA 142 141 142 142  K 5.7* 5.4* 4.7 4.7  CL 109 117* 108 110  CO2 19  --  18* 16*  GLUCOSE 69* 70 99 113*  BUN 53* 66* 53* 60*  CREATININE 4.87* 5.30* 4.89* 5.67*  CALCIUM 9.9  --  9.8 9.8  PHOS  --   --  3.9  --    Liver Function Tests:  Recent Labs Lab 01/15/14 1407 01/15/14 1957  AST 27 41*  ALT 11 16  ALKPHOS 67 67  BILITOT <0.2* <0.2*  PROT 7.6 7.6  ALBUMIN 2.9* 2.9*   No results found for this basename: LIPASE, AMYLASE,  in the last 168 hours No results found for this basename: AMMONIA,  in the last 168 hours CBC:  Recent Labs Lab 01/15/14 1407 01/15/14 1433 01/16/14 0450  WBC 6.6  --  22.7*  NEUTROABS 6.0  --   --   HGB 10.9* 11.9* 9.6*  HCT 35.0* 35.0* 30.5*  MCV 99.2  --  95.0  PLT 320  --  302   Cardiac Enzymes: No results found for this basename: CKTOTAL, CKMB, CKMBINDEX, TROPONINI,  in the last 168 hours CBG:  Recent Labs Lab 01/15/14 1722 01/15/14 1745  01/15/14 1920 01/16/14 0018 01/16/14 0359  GLUCAP 60* 155* 85 104* 103*    Iron Studies: No results found for this basename: IRON, TIBC, TRANSFERRIN, FERRITIN,  in the last 72 hours Studies/Results: Ct Head Wo Contrast  01/15/2014   CLINICAL DATA:  Unresponsive  EXAM: CT HEAD WITHOUT CONTRAST  TECHNIQUE: Contiguous axial images were obtained from the base of the skull through the vertex without intravenous contrast.  COMPARISON:  09/21/2013  FINDINGS: Paranasal sinuses are well aerated. A small amount of mucosal thickening is noted within the maxillary antrum. No acute bony abnormality is seen. The ventricles are normal in size and configuration. No findings to suggest acute hemorrhage, acute infarction or space-occupying mass lesion are noted.  IMPRESSION: Sinus changes as described.  No acute  abnormality noted.   Electronically Signed   By: Inez Catalina M.D.   On: 01/15/2014 15:26   Dg Chest Port 1 View  01/15/2014   CLINICAL DATA:  Central line placement.  EXAM: PORTABLE CHEST - 1 VIEW  COMPARISON:  01/15/2014  FINDINGS: The endotracheal tube and NG tubes are stable. The right IJ catheter has a somewhat medial course. Some of this could be due to the rotation of the patient to the left. However, recommend correlation with venous return and perhaps blood gas analysis. No pneumothorax. The right lung is much better aerated. There is still significant airspace opacification. Left lower lobe atelectasis is noted.  IMPRESSION: The right IJ catheter has a somewhat medial course. Could not exclude the possibility of arterial placement. Recommend correlation with venous blood return and perhaps blood gas analysis.  Improved right lung aeration.   Electronically Signed   By: Kalman Jewels M.D.   On: 01/15/2014 21:30   Dg Chest Port 1 View  01/15/2014   CLINICAL DATA:  ENDOTRACHEAL TUBE PLACEMENT.  UNRESPONSIVE.  EXAM: PORTABLE CHEST - 1 VIEW  COMPARISON:  09/1913  FINDINGS: 1440 HRS. Endotracheal tube tip is 4.7 cm above the base of the carina. NG tube tip overlies the mid stomach. Left lung is clear. There is diffuse airspace disease and volume loss in the right lung. Telemetry leads overlie the chest.  IMPRESSION: Collapse/consolidation in the right lung.   Electronically Signed   By: [name] Stanley M.D.   On: 01/15/2014 15:04   Medications: Infusions: . sodium chloride 75 mL/hr at 01/15/14 1944  . phenylephrine (NEO-SYNEPHRINE) Adult infusion    . propofol 20 mcg/kg/min (01/16/14 0545)    Scheduled Medications: . acetylcysteine  4 mL Nebulization TID  . ampicillin-sulbactam (UNASYN) IV  3 g Intravenous Q24H  . antiseptic oral rinse  7 mL Mouth Rinse QID  . chlorhexidine  15 mL Mouth Rinse BID  . [START ON 01/17/2014] Influenza vac split quadrivalent PF  0.5 mL Intramuscular Tomorrow-1000  .  ipratropium-albuterol  3 mL Nebulization Q6H  . pantoprazole (PROTONIX) IV  40 mg Intravenous QHS  . [START ON 01/17/2014] vancomycin  1,000 mg Intravenous Q48H    have reviewed scheduled and prn medications.  Physical Exam: General: sedated on vent Heart: tachy Lungs: CBS bilat Abdomen: soft, non tender, non distended Extremities: no significant peripheral edema Dialysis Access: right upper arm AVF- good thrill and bruit    01/16/2014,7:42 AM  LOS: 1 day

## 2014-01-16 NOTE — Progress Notes (Signed)
PULMONARY / CRITICAL CARE MEDICINE   Name: Jennifer Lawson MRN: [id number] DOB: [birth year]    ADMISSION DATE:  01/15/2014 CONSULTATION DATE:  01/15/2014  REFERRING MD :  EDP  CHIEF COMPLAINT:  AMS  INITIAL PRESENTATION: 53 yo with history of CKD, seizures and psych history presented to ED 9/9 after being found down at home - suspected. Last seen normal 9/8 PM. Family states she was She was unresponsive for EMS and intubated in ED. In ED CXR shows opacification of entire R airspace - suspected aspiration. PCCM asked to see for ICU admission.    SIGNIFICANT EVENTS/STUDIES:  9/09 intubated, admitted to ICU.  9/09 CT head: no acute intracranial abnormality  9/10 EEG: This EEG is consistent with diffuse non-specific cerebral dysfunction, and has features consistent with anesthetic sedation. No electrographic seizures were seen.   LINES/TUBES: ETT 9/09 >>  R IJ CVL 9/09 >>   MICRO: Urine 9/09 >>  Resp  9/10 >>  Blood 9/09 >>   ABX: Vanc 9/09 >>  Amp-sulbactam 9/09 >>    SUBJECTIVE:  RASS -3. Does not meet SBT criteria. No WUA performed   VITAL SIGNS: Temp:  [98.5 F (36.9 C)-102 F (38.9 C)] 98.5 F (36.9 C) (09/10 1500) Pulse Rate:  [71-127] 103 (09/10 1500) Resp:  [16-35] 26 (09/10 1500) BP: (71-151)/(41-93) 125/77 mmHg (09/10 1500) SpO2:  [93 %-100 %] 98 % (09/10 1500) FiO2 (%):  [0.8 %-100 %] 40 % (09/10 1442) Weight:  [81.9 kg (180 lb 8.9 oz)-82 kg (180 lb 12.4 oz)] 82 kg (180 lb 12.4 oz) (09/10 0426) HEMODYNAMICS:   VENTILATOR SETTINGS: Vent Mode:  [-] PRVC FiO2 (%):  [0.8 %-100 %] 40 % Set Rate:  [16 bmp-26 bmp] 26 bmp Vt Set:  [440 mL-500 mL] 440 mL PEEP:  [5 cmH20-10 cmH20] 5 cmH20 Plateau Pressure:  [16 cmH20-25 cmH20] 16 cmH20 INTAKE / OUTPUT:  Intake/Output Summary (Last 24 hours) at 01/16/14 1532 Last data filed at 01/16/14 1500  Gross per 24 hour  Intake 3631.9 ml  Output   1510 ml  Net 2121.9 ml    PHYSICAL EXAMINATION: General:  Sedated,  NAD Neuro:  MAEs, no focal deficits.  HEENT:  [place]/AT Cardiovascular:  Reg, no M Lungs: R>L rhonchi Abdomen: soft, NT, +BS Ext: no edema, AV fistula RUE   LABS: I have reviewed all of today's lab results. Relevant abnormalities are discussed in the A/P section  CXR: diffuse AS dz on R   ASSESSMENT / PLAN:  PULMONARY OETT 9/9 >>> A:  Acute hypercarbic respiratory failure Aspiration Pneumonia Mucus plugging with R lung collapse 9/09, improved 9/10 COPD, Tobacco abuse disorder P:   Cont full vent support - settings reviewed and/or adjusted Cont vent bundle Daily SBT if/when meets criteria Cont BDs Cont nebulized NAC Recheck ABG 9/11  CARDIOVASCULAR A:  Tachycardia H/o HTN Chronic beta blocker use P:  Monitor PRN metoprolol to maintain HR < 115/min  RENAL A:   ESRD Metabolic acidosis Hyperkalemia, treated P:   Monitor BMET intermittently Monitor I/Os Correct electrolytes as indicated HCO3 gtt initiated by Renal 9/10  GASTROINTESTINAL A:   GERD P:   SUP: enteral famotidine Begin TFs 9/10  HEMATOLOGIC A:   Anemia due to renal failure P:  DVT px: SCDs Monitor CBC intermittently Transfuse per usual ICU guidelines  INFECTIOUS A:   Aspiration PNA  P:   Micro and abx as above  ENDOCRINE A:   DM II P:   Cont SSI protocol  NEUROLOGIC A:  Acute encephalopathy Seizure D/O Suspected seizure on admission Subtherapeutic VPA level on admission H/o depression, PTSD, psychosis H/o OD P:   RASS goal:-1 Holding home meds (geodon, gabapentin) Cont propofol.  Neuro managing further eval MRI ordered EEG ordered  TODAY'S SUMMARY:     I have personally obtained a history, examined the patient, evaluated laboratory and imaging results, formulated the assessment and plan and placed orders. CRITICAL CARE: The patient is critically ill with multiple organ systems failure and requires high complexity decision making for assessment and support,  frequent evaluation and titration of therapies, application of advanced monitoring technologies and extensive interpretation of multiple databases. Critical Care Time devoted to patient care services described in this note is 35 minutes.   Merton Border, MD ; [facility] [phone].  After 5:30 PM or weekends, call [phone]

## 2014-01-16 NOTE — Progress Notes (Signed)
Pt transported to/from MRI w/ no apparent complications.   

## 2014-01-16 NOTE — Progress Notes (Signed)
INITIAL NUTRITION ASSESSMENT  DOCUMENTATION CODES Per approved criteria  -Obesity Unspecified   INTERVENTION:  Utilize 60M PEPuP Protocol: initiate TF via OGT with Vital High Protein at 20 ml/h and Prostat 30 ml QID. This will be the goal rate for now (with current rate of Propofol) to provide 880 kcals, 102 gm protein, 401 ml free water daily.  Above TF regimen plus current Propofol will provide a total of 1400 kcals per day (25 kcals/kg ideal weight).  NUTRITION DIAGNOSIS: Inadequate oral intake related to inability to eat as evidenced by NPO status.   Goal: Enteral nutrition to provide 60-70% of estimated calorie needs (22-25 kcals/kg ideal body weight) and 100% of estimated protein needs, based on ASPEN guidelines for hypocaloric high protein feeding in critically ill obese individuals  Monitor:  TF tolerance/adequacy, weight trend, labs, vent status.  Reason for Assessment: MD Consult for TF initiation and management.  53 y.o. female  Admitting Dx: AMS  ASSESSMENT: 53 yo with history of seizures and psych history presented to ED 9/9 after being found down at home. She was unresponsive for EMS and intubated in ED. In ED CXR shows opacification of entire R airspace.   Discussed patient in ICU rounds today. Received MD Consult for TF initiation and management. Nutrition focused physical exam completed.  No muscle or subcutaneous fat depletion noticed. Patient with a significant amount of weight loss over the past 6 months of ~8% of usual weight. From review of weight history, weight has fluctuated a lot over the past year and a half.   Patient is currently intubated on ventilator support MV: 11.6 L/min Temp (24hrs), Avg:101 F (38.3 C), Min:99.3 F (37.4 C), Max:102 F (38.9 C)  Propofol: 19.7 ml/hr providing 520 kcals/day.  Height: Ht Readings from Last 1 Encounters:  01/15/14 5\' 4"  (1.626 m)    Weight: Wt Readings from Last 1 Encounters:  01/16/14 180 lb 12.4 oz (82  kg)    Ideal Body Weight: 54.5 kg  % Ideal Body Weight: 150%  Wt Readings from Last 10 Encounters:  01/16/14 180 lb 12.4 oz (82 kg)  09/24/13 195 lb (88.451 kg)  06/30/13 225 lb 8.5 oz (102.3 kg)  06/11/13 213 lb (96.616 kg)  04/26/13 210 lb 5.1 oz (95.4 kg)  02/20/13 208 lb 12.8 oz (94.711 kg)  01/22/13 199 lb 1.2 oz (90.3 kg)  11/13/12 209 lb 3.5 oz (94.9 kg)  11/13/12 209 lb 3.5 oz (94.9 kg)  07/31/12 198 lb 6.6 oz (90 kg)    Usual Body Weight: 195 lb (4 months ago)  % Usual Body Weight: 92%  BMI:  Body mass index is 31.02 kg/(m^2). class 1 obesity  Estimated Nutritional Needs: Kcal: 1996 Protein: >/= 109 gm Fluid: 2 L  Skin: no issues  Diet Order: NPO  EDUCATION NEEDS: -Education not appropriate at this time   Intake/Output Summary (Last 24 hours) at 01/16/14 1449 Last data filed at 01/16/14 1400  Gross per 24 hour  Intake 3527.2 ml  Output   1510 ml  Net 2017.2 ml    Last BM: None documented   Labs:   Recent Labs Lab 01/15/14 1407 01/15/14 1433 01/15/14 1957 01/16/14 0450  NA 142 141 142 142  K 5.7* 5.4* 4.7 4.7  CL 109 117* 108 110  CO2 19  --  18* 16*  BUN 53* 66* 53* 60*  CREATININE 4.87* 5.30* 4.89* 5.67*  CALCIUM 9.9  --  9.8 9.8  MG  --   --  2.9*  --   PHOS  --   --  3.9  --   GLUCOSE 69* 70 99 113*    CBG (last 3)   Recent Labs  01/16/14 0359 01/16/14 0754 01/16/14 1216  GLUCAP 103* 109* 104*    Scheduled Meds: . acetylcysteine  4 mL Nebulization TID  . ampicillin-sulbactam (UNASYN) IV  3 g Intravenous Q24H  . antiseptic oral rinse  7 mL Mouth Rinse QID  . chlorhexidine  15 mL Mouth Rinse BID  . darbepoetin (ARANESP) injection - NON-DIALYSIS  100 mcg Subcutaneous Q Thu-1800  . famotidine  20 mg Per Tube QHS  . feeding supplement (PRO-STAT SUGAR FREE 64)  30 mL Per Tube BID  . feeding supplement (VITAL HIGH PROTEIN)  1,000 mL Per Tube Q24H  . [START ON 01/17/2014] Influenza vac split quadrivalent PF  0.5 mL  Intramuscular Tomorrow-1000  . ipratropium-albuterol  3 mL Nebulization Q6H  . [START ON 01/17/2014] vancomycin  1,000 mg Intravenous Q48H    Continuous Infusions: . sodium chloride 10 mL/hr at 01/16/14 0830  . propofol 40 mcg/kg/min (01/16/14 1400)  .  sodium bicarbonate  infusion 1000 mL 75 mL/hr at 01/16/14 5625    Past Medical History  Diagnosis Date  . Mental disorder   . Depression   . Hypertension   . Overdose   . Tobacco use disorder 11/13/2012  . Complication of anesthesia     difficulty going to sleep  . Chronic kidney disease     06/11/13- not on dialysis  . Shortness of breath     lying down flat  . Diabetes mellitus without complication     denies  . PTSD (post-traumatic stress disorder)   . Asthma   . COPD (chronic obstructive pulmonary disease)   . Heart murmur   . GERD (gastroesophageal reflux disease)   . Seizures     "passsed out"  . History of blood transfusion     Past Surgical History  Procedure Laterality Date  . Right knee replacement      she says it was last year.  . Esophagogastroduodenoscopy Left 11/14/2012    Procedure: ESOPHAGOGASTRODUODENOSCOPY (EGD);  Surgeon: [name], MD;  Location: [facility];  Service: Endoscopy;  Laterality: Left;  . Joint replacement Right     knee  . Parathyroidectomy    . Av fistula placement Right 06/12/2013    Procedure: ARTERIOVENOUS (AV) FISTULA CREATION; RIGHT  BASILIC VEIN TRANSPOSITION with Intraoperative ultrasound;  Surgeon: Mal [name], MD;  Location: [facility];  Service: Vascular;  Laterality: Right;    Molli Barrows, RD, LDN, [place] Pager [phone] After Hours Pager [phone]

## 2014-01-16 NOTE — Progress Notes (Signed)
UR Completed.  Vergie Living [phone] 01/16/2014

## 2014-01-16 NOTE — Progress Notes (Signed)
NEURO HOSPITALIST PROGRESS NOTE   SUBJECTIVE:                                                                                                                        Sedated with propofol, intubated on the vent. c-EEG monitoring shows no evidence of electrographic seizures at this moment, with a pattern that seems to be more consistent with normal sleep. MRI brain pending. Cr 5.67, WBC 22.7, VPA level 38.1, UA negative, UDS negative.  OBJECTIVE:                                                                                                                           Vital signs in last 24 hours: Temp:  [99.5 F (37.5 C)-102 F (38.9 C)] 100.5 F (38.1 C) (09/10 0700) Pulse Rate:  [104-127] 117 (09/10 0700) Resp:  [16-35] 26 (09/10 0700) BP: (71-151)/(41-100) 109/69 mmHg (09/10 0700) SpO2:  [88 %-100 %] 100 % (09/10 0700) FiO2 (%):  [0.8 %-100 %] 40 % (09/10 0400) Weight:  [81.9 kg (180 lb 8.9 oz)-88.451 kg (195 lb)] 82 kg (180 lb 12.4 oz) (09/10 0426)  Intake/Output from previous day: 09/09 0701 - 09/10 0700 In: 2719 [I.V.:989; NG/GT:30; IV Piggyback:1700] Out: 1430 [Urine:1430] Intake/Output this shift:   Nutritional status: NPO  Past Medical History  Diagnosis Date  . Mental disorder   . Depression   . Hypertension   . Overdose   . Tobacco use disorder 11/13/2012  . Complication of anesthesia     difficulty going to sleep  . Chronic kidney disease     06/11/13- not on dialysis  . Shortness of breath     lying down flat  . Diabetes mellitus without complication     denies  . PTSD (post-traumatic stress disorder)   . Asthma   . COPD (chronic obstructive pulmonary disease)   . Heart murmur   . GERD (gastroesophageal reflux disease)   . Seizures     "passsed out"  . History of blood transfusion    Neurologic Exam:  Mental status: on propofol, but open eyes to verbal commands. CN 2-12: pupils 3-4 mm, reactive. No gaze preference.  Blinks to threat. No obvious face weakness. Tongue: intubated. Motor: moves all limbs  on painful stimuli. Sensory: withdraws to pain. Plantars: downgoing. Coordination and gait: no tested.   Lab Results: No results found for this basename: cbc, bmp, coags, chol, tri, ldl, hga1c   Lipid Panel  Recent Labs  01/15/14 1957  TRIG 173*    Studies/Results: Ct Head Wo Contrast  01/15/2014   CLINICAL DATA:  Unresponsive  EXAM: CT HEAD WITHOUT CONTRAST  TECHNIQUE: Contiguous axial images were obtained from the base of the skull through the vertex without intravenous contrast.  COMPARISON:  09/21/2013  FINDINGS: Paranasal sinuses are well aerated. A small amount of mucosal thickening is noted within the maxillary antrum. No acute bony abnormality is seen. The ventricles are normal in size and configuration. No findings to suggest acute hemorrhage, acute infarction or space-occupying mass lesion are noted.  IMPRESSION: Sinus changes as described.  No acute abnormality noted.   Electronically Signed   By: Inez Catalina M.D.   On: 01/15/2014 15:26   Dg Chest Port 1 View  01/16/2014   CLINICAL DATA:  Followup infiltrate/ consolidation  EXAM: PORTABLE CHEST - 1 VIEW  COMPARISON:  Prior chest x-ray 01/15/2014  FINDINGS: The endotracheal tube is 3.5 cm above the carina. The tip of the nasogastric tube projects over the gastric bubble. The right IJ central venous catheter overlies the mid SVC. Persistent but slightly improved patchy airspace opacities throughout the right lung. Improving left basilar opacity with residual linear densities likely reflecting atelectasis. Query trace bilateral layering pleural effusions. No pneumothorax or pulmonary edema. No acute osseous abnormality.  IMPRESSION: 1. Slightly improved right lung aeration, particularly in the right base likely reflects interval improvement in atelectasis. The background of patchy airspace opacity throughout the right upper and lower lobes is  otherwise unchanged and remains consistent with pneumonia. 2. Improving left basilar atelectasis. 3. Probable trace bilateral pleural effusions. 4. Stable and satisfactory support apparatus as above.   Electronically Signed   By: Jacqulynn Cadet M.D.   On: 01/16/2014 07:42   Dg Chest Port 1 View  01/15/2014   CLINICAL DATA:  Central line placement.  EXAM: PORTABLE CHEST - 1 VIEW  COMPARISON:  01/15/2014  FINDINGS: The endotracheal tube and NG tubes are stable. The right IJ catheter has a somewhat medial course. Some of this could be due to the rotation of the patient to the left. However, recommend correlation with venous return and perhaps blood gas analysis. No pneumothorax. The right lung is much better aerated. There is still significant airspace opacification. Left lower lobe atelectasis is noted.  IMPRESSION: The right IJ catheter has a somewhat medial course. Could not exclude the possibility of arterial placement. Recommend correlation with venous blood return and perhaps blood gas analysis.  Improved right lung aeration.   Electronically Signed   By: Kalman Jewels M.D.   On: 01/15/2014 21:30   Dg Chest Port 1 View  01/15/2014   CLINICAL DATA:  ENDOTRACHEAL TUBE PLACEMENT.  UNRESPONSIVE.  EXAM: PORTABLE CHEST - 1 VIEW  COMPARISON:  09/1913  FINDINGS: 1440 HRS. Endotracheal tube tip is 4.7 cm above the base of the carina. NG tube tip overlies the mid stomach. Left lung is clear. There is diffuse airspace disease and volume loss in the right lung. Telemetry leads overlie the chest.  IMPRESSION: Collapse/consolidation in the right lung.   Electronically Signed   By: [name] Stanley M.D.   On: 01/15/2014 15:04    MEDICATIONS  Scheduled: . acetylcysteine  4 mL Nebulization TID  . ampicillin-sulbactam (UNASYN) IV  3 g Intravenous Q24H  . antiseptic oral rinse  7 mL Mouth Rinse QID  .  chlorhexidine  15 mL Mouth Rinse BID  . darbepoetin (ARANESP) injection - NON-DIALYSIS  100 mcg Subcutaneous Q Thu-1800  . [START ON 01/17/2014] Influenza vac split quadrivalent PF  0.5 mL Intramuscular Tomorrow-1000  . ipratropium-albuterol  3 mL Nebulization Q6H  . pantoprazole (PROTONIX) IV  40 mg Intravenous QHS  . [START ON 01/17/2014] vancomycin  1,000 mg Intravenous Q48H    ASSESSMENT/PLAN:                                                                                                           53 y/o with known stage IV CKD admitted with altered mental status and myoclonic-like movements left shoulder-neck (no visible on today exam). No evidence of electrographic seizures on c-EEG. MRI brain pending. Now febrile, leukocytosis >22,000, and opacification of her right-sided airspace, question aspiration pneumonia. Further, both respiratory and metabolic acidosis. Concern of gabapentin neuro-toxicity, now with a superimposed component of metabolic encephalopathy.  Plan for initiating dialysis as per nephrology. Will follow up.  Dorian Pod, MD [facility] [phone]  01/16/2014, 8:09 AM

## 2014-01-17 ENCOUNTER — Inpatient Hospital Stay (HOSPITAL_COMMUNITY): Payer: Medicare Other

## 2014-01-17 DIAGNOSIS — J690 Pneumonitis due to inhalation of food and vomit: Principal | ICD-10-CM

## 2014-01-17 LAB — GLUCOSE, CAPILLARY
Glucose-Capillary: 100 mg/dL — ABNORMAL HIGH (ref 70–99)
Glucose-Capillary: 100 mg/dL — ABNORMAL HIGH (ref 70–99)
Glucose-Capillary: 104 mg/dL — ABNORMAL HIGH (ref 70–99)
Glucose-Capillary: 112 mg/dL — ABNORMAL HIGH (ref 70–99)
Glucose-Capillary: 80 mg/dL (ref 70–99)
Glucose-Capillary: 84 mg/dL (ref 70–99)

## 2014-01-17 LAB — COMPREHENSIVE METABOLIC PANEL
ALT: 12 U/L (ref 0–35)
AST: 19 U/L (ref 0–37)
Albumin: 1.9 g/dL — ABNORMAL LOW (ref 3.5–5.2)
Alkaline Phosphatase: 56 U/L (ref 39–117)
Anion gap: 16 — ABNORMAL HIGH (ref 5–15)
BUN: 72 mg/dL — ABNORMAL HIGH (ref 6–23)
CO2: 20 mEq/L (ref 19–32)
Calcium: 9 mg/dL (ref 8.4–10.5)
Chloride: 109 mEq/L (ref 96–112)
Creatinine, Ser: 5.32 mg/dL — ABNORMAL HIGH (ref 0.50–1.10)
GFR calc Af Amer: 10 mL/min — ABNORMAL LOW (ref >90)
GFR calc non Af Amer: 8 mL/min — ABNORMAL LOW (ref >90)
Glucose, Bld: 102 mg/dL — ABNORMAL HIGH (ref 70–99)
Potassium: 3.6 mEq/L — ABNORMAL LOW (ref 3.7–5.3)
Sodium: 145 mEq/L (ref 137–147)
Total Bilirubin: <0.2 mg/dL — ABNORMAL LOW (ref 0.3–1.2)
Total Protein: 5.4 g/dL — ABNORMAL LOW (ref 6.0–8.3)

## 2014-01-17 LAB — CBC
HCT: 22.2 % — ABNORMAL LOW (ref 36.0–46.0)
Hemoglobin: 7.3 g/dL — ABNORMAL LOW (ref 12.0–15.0)
MCH: 30.8 pg (ref 26.0–34.0)
MCHC: 32.9 g/dL (ref 30.0–36.0)
MCV: 93.7 fL (ref 78.0–100.0)
Platelets: 203 10*3/uL (ref 150–400)
RBC: 2.37 MIL/uL — ABNORMAL LOW (ref 3.87–5.11)
RDW: 16.1 % — ABNORMAL HIGH (ref 11.5–15.5)
WBC: 18.9 10*3/uL — ABNORMAL HIGH (ref 4.0–10.5)

## 2014-01-17 LAB — BLOOD GAS, ARTERIAL
Acid-base deficit: 1.4 mmol/L (ref 0.0–2.0)
Bicarbonate: 21.6 mEq/L (ref 20.0–24.0)
Drawn by: 41308
FIO2: 40 %
MECHVT: 440 mL
O2 Saturation: 98.3 %
PEEP: 5 cmH2O
Patient temperature: 98.6
RATE: 26 resp/min
TCO2: 22.5 mmol/L (ref 0–100)
pCO2 arterial: 29.2 mmHg — ABNORMAL LOW (ref 35.0–45.0)
pH, Arterial: 7.483 — ABNORMAL HIGH (ref 7.350–7.450)
pO2, Arterial: 105 mmHg — ABNORMAL HIGH (ref 80.0–100.0)

## 2014-01-17 LAB — IRON AND TIBC
Iron: <10 ug/dL — ABNORMAL LOW (ref 42–135)
UIBC: 151 ug/dL (ref 125–400)

## 2014-01-17 LAB — FERRITIN: Ferritin: 81 ng/mL (ref 10–291)

## 2014-01-17 LAB — HEPATITIS B SURFACE ANTIGEN: Hepatitis B Surface Ag: NEGATIVE

## 2014-01-17 MED ORDER — FREE WATER
200.0000 mL | Freq: Three times a day (TID) | Status: DC
  Administered 2014-01-17 – 2014-01-18 (×3): 200 mL

## 2014-01-17 MED ORDER — SODIUM BICARBONATE 8.4 % IV SOLN
INTRAVENOUS | Status: DC
  Administered 2014-01-17: 12:00:00 via INTRAVENOUS
  Filled 2014-01-17 (×2): qty 50

## 2014-01-17 NOTE — Progress Notes (Signed)
Attempted to wean on vent again. Pt has moments of agitation followed by periods of apnea.  RN aware.  Pt back on full vent support for now.

## 2014-01-17 NOTE — Progress Notes (Signed)
Subjective:  Still febrile overnight- UOP back to great- creatinine down but BUN up slightly- taking down propofol to assess neurologically  Objective Vital signs in last 24 hours: Filed Vitals:   01/17/14 0400 01/17/14 0404 01/17/14 0500 01/17/14 0600  BP: 115/69 115/69 119/70 103/64  Pulse: 100 100 98 97  Temp: 100.3 F (37.9 C)  99.8 F (37.7 C) 99.5 F (37.5 C)  TempSrc:      Resp: 26 26 26 26   Height:      Weight:   84.7 kg (186 lb 11.7 oz)   SpO2: 98% 98% 98% 99%   Weight change: -3.752 kg (-8 lb 4.3 oz)  Intake/Output Summary (Last 24 hours) at 01/17/14 0813 Last data filed at 01/17/14 0600  Gross per 24 hour  Intake 2887.7 ml  Output   1305 ml  Net 1582.7 ml    Assessment/Plan: 53 year old black female with multiple medical issues including stage IV CKD. She presents with a complaint of being acutely unresponsive with respiratory failure requiring intubation.  1.Renal- known stage IV CKD at baseline with creatinine in the fours. Creatinine slightly worse than baseline at this time. She is nonoliguric There are no acute indications for dialysis, but if she continues to have very poor neurologic status and BUN worsening that may be indication to start HD- since we have an access that appears functional.    2. Hypertension/volume - does not appear to be significantly volume overloaded. Blood pressure is a little soft. Given acidosis have changed hydration to bicarb based fluids- continue for now 3. pulmonary - opacification of her right-sided airspace. Now fever. Started on antibiotics per CCM. Ventilator support for now. Question aspiration. ABG much improved 4 altered mental status- appears to be of acute onset. Not consistent with uremia. History of at least 600 mg of Neurontin and history of Neurontin toxicity this could be the culprit. Also just altered MS based on sickness 5. Hyperkalemia- better 6. Acidosis - both resp and metabolic- have given bicarb- is improved- pH is  up but maybe resp- will continue drip for now-  7. Anemia- hgb has decreased with events- give aranesp and check iron stores - now 7.3- may need transfusion before all is said and done   Jennifer Lawson A    Labs: Basic Metabolic Panel:  Recent Labs Lab 01/15/14 1957 01/16/14 0450 01/17/14 0500  NA 142 142 145  K 4.7 4.7 3.6*  CL 108 110 109  CO2 18* 16* 20  GLUCOSE 99 113* 102*  BUN 53* 60* 72*  CREATININE 4.89* 5.67* 5.32*  CALCIUM 9.8 9.8 9.0  PHOS 3.9  --   --    Liver Function Tests:  Recent Labs Lab 01/15/14 1407 01/15/14 1957 01/17/14 0500  AST 27 41* 19  ALT 11 16 12   ALKPHOS 67 67 56  BILITOT <0.2* <0.2* <0.2*  PROT 7.6 7.6 5.4*  ALBUMIN 2.9* 2.9* 1.9*   No results found for this basename: LIPASE, AMYLASE,  in the last 168 hours No results found for this basename: AMMONIA,  in the last 168 hours CBC:  Recent Labs Lab 01/15/14 1407 01/15/14 1433 01/16/14 0450 01/17/14 0500  WBC 6.6  --  22.7* 18.9*  NEUTROABS 6.0  --   --   --   HGB 10.9* 11.9* 9.6* 7.3*  HCT 35.0* 35.0* 30.5* 22.2*  MCV 99.2  --  95.0 93.7  PLT 320  --  302 203   Cardiac Enzymes: No results found for this basename: CKTOTAL, CKMB,  CKMBINDEX, TROPONINI,  in the last 168 hours CBG:  Recent Labs Lab 01/16/14 1216 01/16/14 1518 01/16/14 1940 01/16/14 2353 01/17/14 0348  GLUCAP 104* 111* 111* 112* 104*    Iron Studies: No results found for this basename: IRON, TIBC, TRANSFERRIN, FERRITIN,  in the last 72 hours Studies/Results: Ct Head Wo Contrast  01/15/2014   CLINICAL DATA:  Unresponsive  EXAM: CT HEAD WITHOUT CONTRAST  TECHNIQUE: Contiguous axial images were obtained from the base of the skull through the vertex without intravenous contrast.  COMPARISON:  09/21/2013  FINDINGS: Paranasal sinuses are well aerated. A small amount of mucosal thickening is noted within the maxillary antrum. No acute bony abnormality is seen. The ventricles are normal in size and  configuration. No findings to suggest acute hemorrhage, acute infarction or space-occupying mass lesion are noted.  IMPRESSION: Sinus changes as described.  No acute abnormality noted.   Electronically Signed   By: Inez Catalina M.D.   On: 01/15/2014 15:26   Mr Brain Wo Contrast  01/16/2014   CLINICAL DATA:  Altered mental status. Unresponsive last night. The patient has been intubated in requires ventilatory support.  EXAM: MRI HEAD WITHOUT CONTRAST  TECHNIQUE: Multiplanar, multiecho pulse sequences of the brain and surrounding structures were obtained without intravenous contrast.  COMPARISON:  CT head without contrast 01/15/2014. MRI brain 01/19/2013.  FINDINGS: The diffusion-weighted images demonstrate no evidence for acute or subacute infarction.  No hemorrhage or mass lesion is present. The ventricles are of normal size. There is no significant white matter disease. No significant extra-axial fluid collection is present.  Flow is present in the major intracranial arteries. The patient is status post scratch of the globes and orbits are intact.  Mild mucosal thickening and a small polyp is noted posteriorly in the maxillary sinuses bilaterally. There is mild mucosal thickening within the posterior ethmoid air cells as well. The remaining paranasal sinuses are clear. There is some fluid in left mastoid air cells. No obstructing nasopharyngeal lesion is evident.  IMPRESSION: 1. Normal MRI appearance the brain. 2. Mild sinus disease. 3. Minimal fluid in the left mastoid air cells.   Electronically Signed   By: Lawrence Santiago M.D.   On: 01/16/2014 17:15   Dg Chest Port 1 View  01/17/2014   CLINICAL DATA:  Respiratory failure  EXAM: PORTABLE CHEST - 1 VIEW  COMPARISON:  01/16/2014  FINDINGS: Endotracheal tube is again noted 3.2 cm above the carina. A right jugular line is seen in the mid superior vena cava. A nasogastric catheter is seen in the gastric fundus. Cardiac shadow is stable. Persistent infiltrate is  noted in the right perihilar region and right lung base. New retrocardiac infiltrate is noted when compare with the prior study.  IMPRESSION: Increasing infiltrate particularly in the left base.   Electronically Signed   By: Inez Catalina M.D.   On: 01/17/2014 07:12   Dg Chest Port 1 View  01/16/2014   CLINICAL DATA:  Followup infiltrate/ consolidation  EXAM: PORTABLE CHEST - 1 VIEW  COMPARISON:  Prior chest x-ray 01/15/2014  FINDINGS: The endotracheal tube is 3.5 cm above the carina. The tip of the nasogastric tube projects over the gastric bubble. The right IJ central venous catheter overlies the mid SVC. Persistent but slightly improved patchy airspace opacities throughout the right lung. Improving left basilar opacity with residual linear densities likely reflecting atelectasis. Query trace bilateral layering pleural effusions. No pneumothorax or pulmonary edema. No acute osseous abnormality.  IMPRESSION: 1. Slightly improved right  lung aeration, particularly in the right base likely reflects interval improvement in atelectasis. The background of patchy airspace opacity throughout the right upper and lower lobes is otherwise unchanged and remains consistent with pneumonia. 2. Improving left basilar atelectasis. 3. Probable trace bilateral pleural effusions. 4. Stable and satisfactory support apparatus as above.   Electronically Signed   By: Jacqulynn Cadet M.D.   On: 01/16/2014 07:42   Dg Chest Port 1 View  01/15/2014   CLINICAL DATA:  Central line placement.  EXAM: PORTABLE CHEST - 1 VIEW  COMPARISON:  01/15/2014  FINDINGS: The endotracheal tube and NG tubes are stable. The right IJ catheter has a somewhat medial course. Some of this could be due to the rotation of the patient to the left. However, recommend correlation with venous return and perhaps blood gas analysis. No pneumothorax. The right lung is much better aerated. There is still significant airspace opacification. Left lower lobe atelectasis is  noted.  IMPRESSION: The right IJ catheter has a somewhat medial course. Could not exclude the possibility of arterial placement. Recommend correlation with venous blood return and perhaps blood gas analysis.  Improved right lung aeration.   Electronically Signed   By: Kalman Jewels M.D.   On: 01/15/2014 21:30   Dg Chest Port 1 View  01/15/2014   CLINICAL DATA:  ENDOTRACHEAL TUBE PLACEMENT.  UNRESPONSIVE.  EXAM: PORTABLE CHEST - 1 VIEW  COMPARISON:  09/1913  FINDINGS: 1440 HRS. Endotracheal tube tip is 4.7 cm above the base of the carina. NG tube tip overlies the mid stomach. Left lung is clear. There is diffuse airspace disease and volume loss in the right lung. Telemetry leads overlie the chest.  IMPRESSION: Collapse/consolidation in the right lung.   Electronically Signed   By: [name] Stanley M.D.   On: 01/15/2014 15:04   Dg Abd Portable 1v  01/17/2014   CLINICAL DATA:  OG tube placement.  EXAM: PORTABLE ABDOMEN - 1 VIEW  COMPARISON:  09/21/2013  FINDINGS: Enteric tube tip is in the left upper quadrant consistent with location in the mid stomach. Atelectasis is suggested in both lung bases. Bowel gas pattern is normal.  IMPRESSION: Enteric tube tip in the left upper quadrant consistent with location in the stomach.   Electronically Signed   By: Lucienne Capers M.D.   On: 01/17/2014 05:37   Medications: Infusions: . sodium chloride 10 mL/hr at 01/16/14 1800  . propofol 40 mcg/kg/min (01/17/14 0657)  .  sodium bicarbonate  infusion 1000 mL 75 mL/hr at 01/16/14 2200    Scheduled Medications: . acetylcysteine  4 mL Nebulization TID  . ampicillin-sulbactam (UNASYN) IV  3 g Intravenous Q24H  . antiseptic oral rinse  7 mL Mouth Rinse QID  . chlorhexidine  15 mL Mouth Rinse BID  . darbepoetin (ARANESP) injection - NON-DIALYSIS  100 mcg Subcutaneous Q Thu-1800  . famotidine  20 mg Per Tube QHS  . feeding supplement (PRO-STAT SUGAR FREE 64)  30 mL Per Tube QID  . feeding supplement (VITAL HIGH  PROTEIN)  1,000 mL Per Tube Q24H  . Influenza vac split quadrivalent PF  0.5 mL Intramuscular Tomorrow-1000  . ipratropium-albuterol  3 mL Nebulization Q6H  . vancomycin  1,000 mg Intravenous Q48H    have reviewed scheduled and prn medications.  Physical Exam: General: sedated on vent Heart: tachy Lungs: CBS bilat Abdomen: soft, non tender, non distended Extremities: no significant peripheral edema Dialysis Access: right upper arm AVF- good thrill and bruit    01/17/2014,8:13  AM  LOS: 2 days

## 2014-01-17 NOTE — Progress Notes (Signed)
NEURO HOSPITALIST PROGRESS NOTE   SUBJECTIVE:                                                                                                                        Sedated, intubated on the vent but open her eyes, tracks me in the room, and follows simple commands. No further myoclonic movements noted. Febrile overnight. c-EEG showed diffuse slowing consistent with encephalopathy, but no evidence of electrographic seizures. MRI brain negative for acute abnormality.  OBJECTIVE:                                                                                                                           Vital signs in last 24 hours: Temp:  [98.1 F (36.7 C)-100.6 F (38.1 C)] 98.7 F (37.1 C) (09/11 0800) Pulse Rate:  [71-116] 100 (09/11 0908) Resp:  [16-26] 16 (09/11 0908) BP: (93-132)/(57-77) 118/74 mmHg (09/11 0908) SpO2:  [95 %-100 %] 98 % (09/11 0908) FiO2 (%):  [40 %] 40 % (09/11 0908) Weight:  [84.7 kg (186 lb 11.7 oz)] 84.7 kg (186 lb 11.7 oz) (09/11 0500)  Intake/Output from previous day: 09/10 0701 - 09/11 0700 In: 3022.5 [I.V.:2452.5; NG/GT:470; IV Piggyback:100] Out: 1325 [Urine:1325] Intake/Output this shift: Total I/O In: 94.8 [I.V.:94.8] Out: 250 [Urine:250] Nutritional status: NPO  Past Medical History  Diagnosis Date  . Mental disorder   . Depression   . Hypertension   . Overdose   . Tobacco use disorder 11/13/2012  . Complication of anesthesia     difficulty going to sleep  . Chronic kidney disease     06/11/13- not on dialysis  . Shortness of breath     lying down flat  . Diabetes mellitus without complication     denies  . PTSD (post-traumatic stress disorder)   . Asthma   . COPD (chronic obstructive pulmonary disease)   . Heart murmur   . GERD (gastroesophageal reflux disease)   . Seizures     "passsed out"  . History of blood transfusion     Neurologic Exam:  Mental status: on propofol, but open eyes to  verbal commands.  CN 2-12: pupils 3-4 mm, reactive. No gaze preference. Blinks to threat. No obvious face weakness. Tongue: intubated.  Motor: moves all limbs on painful stimuli.  Sensory: withdraws to pain.  Plantars: downgoing.  Coordination and gait: no tested.   Lab Results: No results found for this basename: cbc, bmp, coags, chol, tri, ldl, hga1c   Lipid Panel  Recent Labs  01/15/14 1957  TRIG 173*    Studies/Results: Ct Head Wo Contrast  01/15/2014   CLINICAL DATA:  Unresponsive  EXAM: CT HEAD WITHOUT CONTRAST  TECHNIQUE: Contiguous axial images were obtained from the base of the skull through the vertex without intravenous contrast.  COMPARISON:  09/21/2013  FINDINGS: Paranasal sinuses are well aerated. A small amount of mucosal thickening is noted within the maxillary antrum. No acute bony abnormality is seen. The ventricles are normal in size and configuration. No findings to suggest acute hemorrhage, acute infarction or space-occupying mass lesion are noted.  IMPRESSION: Sinus changes as described.  No acute abnormality noted.   Electronically Signed   By: Inez Catalina M.D.   On: 01/15/2014 15:26   Mr Brain Wo Contrast  01/16/2014   CLINICAL DATA:  Altered mental status. Unresponsive last night. The patient has been intubated in requires ventilatory support.  EXAM: MRI HEAD WITHOUT CONTRAST  TECHNIQUE: Multiplanar, multiecho pulse sequences of the brain and surrounding structures were obtained without intravenous contrast.  COMPARISON:  CT head without contrast 01/15/2014. MRI brain 01/19/2013.  FINDINGS: The diffusion-weighted images demonstrate no evidence for acute or subacute infarction.  No hemorrhage or mass lesion is present. The ventricles are of normal size. There is no significant white matter disease. No significant extra-axial fluid collection is present.  Flow is present in the major intracranial arteries. The patient is status post scratch of the globes and orbits are  intact.  Mild mucosal thickening and a small polyp is noted posteriorly in the maxillary sinuses bilaterally. There is mild mucosal thickening within the posterior ethmoid air cells as well. The remaining paranasal sinuses are clear. There is some fluid in left mastoid air cells. No obstructing nasopharyngeal lesion is evident.  IMPRESSION: 1. Normal MRI appearance the brain. 2. Mild sinus disease. 3. Minimal fluid in the left mastoid air cells.   Electronically Signed   By: Lawrence Santiago M.D.   On: 01/16/2014 17:15   Dg Chest Port 1 View  01/17/2014   CLINICAL DATA:  Respiratory failure  EXAM: PORTABLE CHEST - 1 VIEW  COMPARISON:  01/16/2014  FINDINGS: Endotracheal tube is again noted 3.2 cm above the carina. A right jugular line is seen in the mid superior vena cava. A nasogastric catheter is seen in the gastric fundus. Cardiac shadow is stable. Persistent infiltrate is noted in the right perihilar region and right lung base. New retrocardiac infiltrate is noted when compare with the prior study.  IMPRESSION: Increasing infiltrate particularly in the left base.   Electronically Signed   By: Inez Catalina M.D.   On: 01/17/2014 07:12   Dg Chest Port 1 View  01/16/2014   CLINICAL DATA:  Followup infiltrate/ consolidation  EXAM: PORTABLE CHEST - 1 VIEW  COMPARISON:  Prior chest x-ray 01/15/2014  FINDINGS: The endotracheal tube is 3.5 cm above the carina. The tip of the nasogastric tube projects over the gastric bubble. The right IJ central venous catheter overlies the mid SVC. Persistent but slightly improved patchy airspace opacities throughout the right lung. Improving left basilar opacity with residual linear densities likely reflecting atelectasis. Query trace bilateral layering pleural effusions. No pneumothorax or pulmonary edema. No acute osseous abnormality.  IMPRESSION: 1. Slightly  improved right lung aeration, particularly in the right base likely reflects interval improvement in atelectasis. The  background of patchy airspace opacity throughout the right upper and lower lobes is otherwise unchanged and remains consistent with pneumonia. 2. Improving left basilar atelectasis. 3. Probable trace bilateral pleural effusions. 4. Stable and satisfactory support apparatus as above.   Electronically Signed   By: Jacqulynn Cadet M.D.   On: 01/16/2014 07:42   Dg Chest Port 1 View  01/15/2014   CLINICAL DATA:  Central line placement.  EXAM: PORTABLE CHEST - 1 VIEW  COMPARISON:  01/15/2014  FINDINGS: The endotracheal tube and NG tubes are stable. The right IJ catheter has a somewhat medial course. Some of this could be due to the rotation of the patient to the left. However, recommend correlation with venous return and perhaps blood gas analysis. No pneumothorax. The right lung is much better aerated. There is still significant airspace opacification. Left lower lobe atelectasis is noted.  IMPRESSION: The right IJ catheter has a somewhat medial course. Could not exclude the possibility of arterial placement. Recommend correlation with venous blood return and perhaps blood gas analysis.  Improved right lung aeration.   Electronically Signed   By: Kalman Jewels M.D.   On: 01/15/2014 21:30   Dg Chest Port 1 View  01/15/2014   CLINICAL DATA:  ENDOTRACHEAL TUBE PLACEMENT.  UNRESPONSIVE.  EXAM: PORTABLE CHEST - 1 VIEW  COMPARISON:  09/1913  FINDINGS: 1440 HRS. Endotracheal tube tip is 4.7 cm above the base of the carina. NG tube tip overlies the mid stomach. Left lung is clear. There is diffuse airspace disease and volume loss in the right lung. Telemetry leads overlie the chest.  IMPRESSION: Collapse/consolidation in the right lung.   Electronically Signed   By: [name] Stanley M.D.   On: 01/15/2014 15:04   Dg Abd Portable 1v  01/17/2014   CLINICAL DATA:  OG tube placement.  EXAM: PORTABLE ABDOMEN - 1 VIEW  COMPARISON:  09/21/2013  FINDINGS: Enteric tube tip is in the left upper quadrant consistent with location in  the mid stomach. Atelectasis is suggested in both lung bases. Bowel gas pattern is normal.  IMPRESSION: Enteric tube tip in the left upper quadrant consistent with location in the stomach.   Electronically Signed   By: Lucienne Capers M.D.   On: 01/17/2014 05:37    MEDICATIONS                                                                                                                        Scheduled: . acetylcysteine  4 mL Nebulization TID  . ampicillin-sulbactam (UNASYN) IV  3 g Intravenous Q24H  . antiseptic oral rinse  7 mL Mouth Rinse QID  . chlorhexidine  15 mL Mouth Rinse BID  . darbepoetin (ARANESP) injection - NON-DIALYSIS  100 mcg Subcutaneous Q Thu-1800  . famotidine  20 mg Per Tube QHS  . feeding supplement (PRO-STAT SUGAR FREE 64)  30 mL Per Tube  QID  . feeding supplement (VITAL HIGH PROTEIN)  1,000 mL Per Tube Q24H  . Influenza vac split quadrivalent PF  0.5 mL Intramuscular Tomorrow-1000  . ipratropium-albuterol  3 mL Nebulization Q6H    ASSESSMENT/PLAN:                                                                                                           53 y/o with known stage IV CKD admitted with altered mental status and myoclonic-like movements left shoulder-neck (no visible on today exam). No evidence of electrographic seizures on c-EEG. MRI brain normal. 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 Mental status seems to be improved today but still febrile with leukocytosis that is trending down. Will follow up.   Dorian Pod, MD [facility] [phone]  01/17/2014, 10:00 AM

## 2014-01-17 NOTE — Progress Notes (Signed)
Patient found with OG tube removed. Tube feedings stopped. Breath sounds unchanged from previous assessment. No signs of respiratory distress. SaO2 <=95%. New OG tube placed, auscultated by Candelaria Celeste, RN and Osvaldo Shipper, RN., awaiting X-ray placement confirmation.

## 2014-01-17 NOTE — Progress Notes (Signed)
Attempted to wean vent again. Pt develops apnea x several attempts today.  Pt placed back on full vent support.

## 2014-01-17 NOTE — Progress Notes (Addendum)
PULMONARY / CRITICAL CARE MEDICINE   Name: Jennifer Lawson MRN: [id number] DOB: Dec 03, 1960    ADMISSION DATE:  01/15/2014 CONSULTATION DATE:  01/15/2014  REFERRING MD :  EDP  CHIEF COMPLAINT:  AMS  INITIAL PRESENTATION: 53 yo with history of CKD, seizures and psych history presented to ED 9/9 after being found down at home - suspected. Last seen normal 9/8 PM. Family states she was She was unresponsive for EMS and intubated in ED. In ED CXR shows opacification of entire R airspace - suspected aspiration. PCCM asked to see for ICU admission.    SIGNIFICANT EVENTS/STUDIES:  9/09 intubated, admitted to ICU.  9/09 CT head: no acute intracranial abnormality  9/10 EEG: This EEG is consistent with diffuse non-specific cerebral dysfunction, and has features consistent with anesthetic sedation. No electrographic seizures were seen. 9/10 MRI brain: normal 9/11 low grade fever. Failed SBT  LINES/TUBES: ETT 9/09 >>  R IJ CVL 9/09 >>   MICRO: Urine 9/09 >> NEG Resp  9/10 >>  Blood 9/09 >>   ABX: Vanc 9/09 >> 9/11 Amp-sulbactam 9/09 >>    SUBJECTIVE:  RASS -1. Failed SBT.    VITAL SIGNS: Temp:  [98.1 F (36.7 C)-100.6 F (38.1 C)] 98.9 F (37.2 C) (09/11 1400) Pulse Rate:  [90-116] 100 (09/11 1510) Resp:  [0-26] 16 (09/11 1510) BP: (93-138)/(57-82) 138/82 mmHg (09/11 1400) SpO2:  [95 %-100 %] 99 % (09/11 1510) FiO2 (%):  [40 %] 40 % (09/11 1510) Weight:  [84.7 kg (186 lb 11.7 oz)] 84.7 kg (186 lb 11.7 oz) (09/11 0500) HEMODYNAMICS:   VENTILATOR SETTINGS: Vent Mode:  [-] PRVC FiO2 (%):  [40 %] 40 % Set Rate:  [16 bmp-26 bmp] 16 bmp Vt Set:  [440 mL] 440 mL PEEP:  [5 cmH20] 5 cmH20 Plateau Pressure:  [16 cmH20-17 cmH20] 16 cmH20 INTAKE / OUTPUT:  Intake/Output Summary (Last 24 hours) at 01/17/14 1511 Last data filed at 01/17/14 1400  Gross per 24 hour  Intake 2739.07 ml  Output   1945 ml  Net 794.07 ml    PHYSICAL EXAMINATION: General:  Sedated, NAD Neuro:   MAEs, no focal deficits.  HEENT:  [place]/AT Cardiovascular:  Reg, no M Lungs: R>L rhonchi Abdomen: soft, NT, +BS Ext: no edema, AV fistula RUE   LABS: I have reviewed all of today's lab results. Relevant abnormalities are discussed in the A/P section  CXR: persistent RLL AS dz. LLL atx vs infiltrate   ASSESSMENT / PLAN:  PULMONARY A:  Acute respiratory failure Aspiration Pneumonia Mucus plugging with R lung collapse 9/09, resolved COPD, Tobacco abuse disorder P:   Cont full vent support - settings reviewed and/or adjusted Cont vent bundle Daily SBT if/when meets criteria Cont BDs Cont nebulized NAC  CARDIOVASCULAR A:  Tachycardia, resolved H/o HTN Chronic beta blocker use P:  Monitor PRN metoprolol to maintain HR < 115/min  RENAL A:   ESRD Metabolic acidosis, improving Hyperkalemia, treated P:   Renal service following. Might need HD. Has RUE fistula Monitor BMET intermittently Monitor I/Os Correct electrolytes as indicated DC HCO3 - discussed with Dr Moshe Cipro  GASTROINTESTINAL A:   GERD P:   SUP: enteral famotidine Cont TFs per protocol  HEMATOLOGIC A:   Anemia due to renal failure Precipitous drop in Hgb 9/11 (9.6 > 7.3) P:  DVT px: SCDs Monitor CBC intermittently Transfuse per usual ICU guidelines  INFECTIOUS A:   Aspiration PNA P:   Micro and abx as above  ENDOCRINE A:   DM  II P:   Cont SSI protocol  NEUROLOGIC A:  Acute encephalopathy Seizure D/O Suspected seizure on admission Suspected gabapentin toxicity Subtherapeutic VPA level on admission H/o depression, PTSD, psychosis H/o OD P:   RASS goal:-1 Holding home meds (geodon, gabapentin) Cont propofol.  Anticonvulsant therapy per Neuro  TODAY'S SUMMARY:     I have personally obtained a history, examined the patient, evaluated laboratory and imaging results, formulated the assessment and plan and placed orders. CRITICAL CARE: The patient is critically ill with  multiple organ systems failure and requires high complexity decision making for assessment and support, frequent evaluation and titration of therapies, application of advanced monitoring technologies and extensive interpretation of multiple databases. Critical Care Time devoted to patient care services described in this note is 35 minutes.   Merton Border, MD ; [facility] [phone].  After 5:30 PM or weekends, call [phone]

## 2014-01-17 NOTE — Clinical Documentation Improvement (Signed)
Presents with Altered Mental Status, source of infection, organ failure.  After study, please clarify the likely etiology of the patients AMS:   Gabapentin toxicity  Aspiration pneumonia  Sepsis - WBC = 22.7 on admit, tachycardic; has source of infection with organ failure  Other condition      Thank You, Zoila Shutter ,RN Clinical Documentation Specialist:  [place] [employer]

## 2014-01-17 NOTE — Care Management Note (Addendum)
Page 1 of 2   01/22/2014     11:32:31 AM CARE MANAGEMENT NOTE 01/22/2014  Patient:  Jennifer Lawson, Jennifer Lawson   Account Number:  [id number]  Date Initiated:  01/16/2014  Documentation initiated by:  [facility]  Subjective/Objective Assessment:   Found down at home - unresponsive - intubated.     Action/Plan:   Anticipated DC Date:  01/22/2014   Anticipated DC Plan:  [place]  CM consult      [facility] Choice  HOME HEALTH   Choice offered to / List presented to:  C-1 Patient   DME arranged  NEBULIZER MACHINE      DME agency  [place] arranged  [place].   Status of service:  Completed, signed off Medicare Important Message given?  YES (If response is "NO", the following Medicare IM given date fields will be blank) Date Medicare IM given:  01/21/2014 Medicare IM given by:  Tomi Bamberger Date Additional Medicare IM given:   Additional Medicare IM given by:    Discharge Disposition:  [place]  Per UR Regulation:  Reviewed for med. necessity/level of care/duration of stay  If discussed at [place] of Stay Meetings, dates discussed:    Comments:  [employer], [place] Daughter [phone]                 Inda Merlin [phone]  01/22/14 [place], BSN [phone] patient is for dc today, patient and daughter chose [facility] for HHPT, RN and aide, referral made to [facility], Butch Penny notified. Soc will begin 24-48 hrs post dc.  Patient states she has a neb machine but it is broken, she has had for a year already.  NCM called Jermaine with [facility] to see if we could get another neb machine for patient, he will get back with me .  Patint was able to get neb machine, it is in her room.  Patient also stated she wanted to change her PCP, but Dr. Almedia Balls is listed on her medicaid card, informed patient and her  daughter that her Medicaid Case worker will have to change the primary on her medicaid card before she can go see another MD.  Marcie Bal the CSW will give patient psych MD referrals.  Patient daughter states patient uses oxygen through [facility] at home only, she does not need it when she goes out, she thinks it is just for patient to feel comfortalbe.  9/11 1140 debbie dowell rn,bsn tried to call da but her vm was full. sw ref has been made. will try and catch da when she visits.ask staff to call cm when da arrives. da had wanted to speak w cm and sw. 9/11 1354 debbie dowell rn,bsn da came in. met w da. she is interested in per care service thru mediciad and i explained prim md must contact [place] and req eval then ral sends rep to eval pt for pcs serv. explained that cm assists w [facility] and eq. will cont to follow and assist as needed and pt progresses.

## 2014-01-18 LAB — CBC
HCT: 26.1 % — ABNORMAL LOW (ref 36.0–46.0)
Hemoglobin: 8.2 g/dL — ABNORMAL LOW (ref 12.0–15.0)
MCH: 29.6 pg (ref 26.0–34.0)
MCHC: 31.4 g/dL (ref 30.0–36.0)
MCV: 94.2 fL (ref 78.0–100.0)
Platelets: 231 10*3/uL (ref 150–400)
RBC: 2.77 MIL/uL — ABNORMAL LOW (ref 3.87–5.11)
RDW: 16.3 % — ABNORMAL HIGH (ref 11.5–15.5)
WBC: 20.2 10*3/uL — ABNORMAL HIGH (ref 4.0–10.5)

## 2014-01-18 LAB — GLUCOSE, CAPILLARY
Glucose-Capillary: 107 mg/dL — ABNORMAL HIGH (ref 70–99)
Glucose-Capillary: 109 mg/dL — ABNORMAL HIGH (ref 70–99)
Glucose-Capillary: 103 mg/dL — ABNORMAL HIGH (ref 70–99)
Glucose-Capillary: 123 mg/dL — ABNORMAL HIGH (ref 70–99)
Glucose-Capillary: 85 mg/dL (ref 70–99)
Glucose-Capillary: 112 mg/dL — ABNORMAL HIGH (ref 70–99)

## 2014-01-18 LAB — RENAL FUNCTION PANEL
Albumin: 2.4 g/dL — ABNORMAL LOW (ref 3.5–5.2)
Anion gap: 14 (ref 5–15)
BUN: 84 mg/dL — ABNORMAL HIGH (ref 6–23)
CO2: 25 mEq/L (ref 19–32)
Calcium: 10.1 mg/dL (ref 8.4–10.5)
Chloride: 112 mEq/L (ref 96–112)
Creatinine, Ser: 4.81 mg/dL — ABNORMAL HIGH (ref 0.50–1.10)
GFR calc Af Amer: 11 mL/min — ABNORMAL LOW (ref >90)
GFR calc non Af Amer: 9 mL/min — ABNORMAL LOW (ref >90)
Glucose, Bld: 101 mg/dL — ABNORMAL HIGH (ref 70–99)
Phosphorus: 5.2 mg/dL — ABNORMAL HIGH (ref 2.3–4.6)
Potassium: 4.5 mEq/L (ref 3.7–5.3)
Sodium: 151 mEq/L — ABNORMAL HIGH (ref 137–147)

## 2014-01-18 MED ORDER — NA FERRIC GLUC CPLX IN SUCROSE 12.5 MG/ML IV SOLN
125.0000 mg | Freq: Every day | INTRAVENOUS | Status: DC
  Administered 2014-01-18 – 2014-01-19 (×2): 125 mg via INTRAVENOUS
  Filled 2014-01-18 (×5): qty 10

## 2014-01-18 MED ORDER — PIPERACILLIN-TAZOBACTAM IN DEX 2-0.25 GM/50ML IV SOLN
2.2500 g | Freq: Four times a day (QID) | INTRAVENOUS | Status: DC
  Administered 2014-01-18 – 2014-01-20 (×8): 2.25 g via INTRAVENOUS
  Filled 2014-01-18 (×11): qty 50

## 2014-01-18 MED ORDER — PIPERACILLIN-TAZOBACTAM 3.375 G IVPB: 3.3750 g | Freq: Three times a day (TID) | INTRAVENOUS | Status: DC

## 2014-01-18 MED ORDER — FREE WATER
400.0000 mL | Freq: Four times a day (QID) | Status: DC
  Administered 2014-01-18 – 2014-01-19 (×4): 400 mL

## 2014-01-18 MED ORDER — DEXTROSE 5 % IV SOLN
INTRAVENOUS | Status: DC
  Administered 2014-01-18 – 2014-01-19 (×3): via INTRAVENOUS

## 2014-01-18 MED ORDER — VALPROIC ACID 250 MG/5ML PO SYRP
250.0000 mg | ORAL_SOLUTION | Freq: Two times a day (BID) | ORAL | Status: AC
  Administered 2014-01-18 – 2014-01-19 (×3): 250 mg via ORAL
  Filled 2014-01-18 (×3): qty 5

## 2014-01-18 NOTE — Progress Notes (Signed)
eLink Physician-Brief Progress Note Patient Name: GENESYS COGGESHALL DOB: 07-05-1960 MRN: [id number]   Date of Service  01/18/2014  HPI/Events of Note  Fever to 102 on unasyn for ? Asp   eICU Interventions  Expand coverage to hcap with zosyn      Intervention Category Major Interventions: Sepsis - evaluation and management  Christinia Gully 01/18/2014, 5:41 PM

## 2014-01-18 NOTE — Progress Notes (Signed)
PULMONARY / CRITICAL CARE MEDICINE   Name: Jennifer Lawson MRN: [id number] DOB: [birth year]    ADMISSION DATE:  01/15/2014 CONSULTATION DATE:  01/15/2014  REFERRING MD :  EDP  CHIEF COMPLAINT:  AMS  INITIAL PRESENTATION: 53 yo with history of CKD, seizures and psych history presented to ED 9/9 after being found down at home - suspected. Last seen normal 9/8 PM. Family states she was She was unresponsive for EMS and intubated in ED. In ED CXR shows opacification of entire R airspace - suspected aspiration. PCCM asked to see for ICU admission.    SIGNIFICANT EVENTS/STUDIES:  9/09 intubated, admitted to ICU.  9/09 CT head: no acute intracranial abnormality  9/10 EEG: This EEG is consistent with diffuse non-specific cerebral dysfunction, and has features consistent with anesthetic sedation. No electrographic seizures were seen. 9/10 MRI brain: normal 9/11 low grade fever. Failed SBT  LINES/TUBES: ETT 9/09 >>  R IJ CVL 9/09 >>   MICRO: Urine 9/09 >> NEG Resp  9/10 >>  Blood 9/09 >>   ABX: Vanc 9/09 >> 9/11 Amp-sulbactam 9/09 >>   SUBJECTIVE:  Follows commands secretions noted  VITAL SIGNS: Temp:  [98.3 F (36.8 C)-99.8 F (37.7 C)] 99.6 F (37.6 C) (09/12 0600) Pulse Rate:  [91-103] 96 (09/12 0600) Resp:  [0-20] 17 (09/12 0600) BP: (109-138)/(60-82) 133/79 mmHg (09/12 0600) SpO2:  [96 %-100 %] 97 % (09/12 0600) FiO2 (%):  [40 %] 40 % (09/12 0600) Weight:  [84.3 kg (185 lb 13.6 oz)] 84.3 kg (185 lb 13.6 oz) (09/12 0600) HEMODYNAMICS:   VENTILATOR SETTINGS: Vent Mode:  [-] PRVC FiO2 (%):  [40 %] 40 % Set Rate:  [16 bmp] 16 bmp Vt Set:  [440 mL] 440 mL PEEP:  [5 cmH20] 5 cmH20 Plateau Pressure:  [16 cmH20-23 cmH20] 23 cmH20 INTAKE / OUTPUT:  Intake/Output Summary (Last 24 hours) at 01/18/14 0851 Last data filed at 01/18/14 0600  Gross per 24 hour  Intake 1665.27 ml  Output   3125 ml  Net -1459.73 ml    PHYSICAL EXAMINATION: General:  Sedated, follows  commands, rass 1 Neuro:  MAEs, no focal deficits.  HEENT:  [place]/AT Cardiovascular:  Reg, no M Lungs: rhonchi diffuse moderate left greatre rt Abdomen: soft, NT, +BS Ext: no edema, AV fistula RUE   LABS: I have reviewed all of today's lab results. Relevant abnormalities are discussed in the A/P section  CXR: increased rt base infiltrate/ atx,. ett wnl   ASSESSMENT / PLAN:  PULMONARY A:  Acute respiratory failure Aspiration Pneumonia increasing Mucus plugging with R lung collapse 9/09, resolved COPD, Tobacco abuse disorder P:   abg reviewed, reduce rate 12 Wean today cpap 5 ps 5, assess rsbi Secretions may be an issue for extubation pcxr concerning as well, may require bronch if worsening collapse Add chest pt  CARDIOVASCULAR A:  Tachycardia, resolved H/o HTN Chronic beta blocker use P:  Monitor PRN metoprolol to maintain HR < 115/min  RENAL A:   ESRD Metabolic acidosis, improving Hyperkalemia, treated P:   Renal service following. Might need HD. Has RUE fistula Na rise, crt down, ensure some free water, consider increase current rate  GASTROINTESTINAL A:   GERD P:   SUP: enteral famotidine Cont TFs per protocol Monitor for BM  HEMATOLOGIC A:   Anemia due to renal failure Precipitous drop in Hgb 9/11 (9.6 > 7.3) Some hemoconcetration P:  DVT px: SCDs Cbc in am  Increase d5w  INFECTIOUS A:   Aspiration PNA, no  fevers P:   Micro and abx as above If lacks progress clinically will broaden pcxr re eval in am   ENDOCRINE A:   DM II P:   Cont SSI protocol  NEUROLOGIC A:  Acute encephalopathy Seizure D/O Suspected seizure on admission Suspected gabapentin toxicity Subtherapeutic VPA level on admission H/o depression, PTSD, psychosis H/o OD P:   RASS goal:-1 Holding home meds (geodon, gabapentin) Cont propofol with WUA Anticonvulsant therapy per Neuro  TODAY'S SUMMARY: WUA, wean, rt base worse, secretions r an issue, increase  d5w   I have personally obtained a history, examined the patient, evaluated laboratory and imaging results, formulated the assessment and plan and placed orders. CRITICAL CARE: The patient is critically ill with multiple organ systems failure and requires high complexity decision making for assessment and support, frequent evaluation and titration of therapies, application of advanced monitoring technologies and extensive interpretation of multiple databases. Critical Care Time devoted to patient care services described in this note is 30 minutes.   Lavon Paganini. Titus Mould, MD, [place] Pgr: [facility] '

## 2014-01-18 NOTE — Progress Notes (Signed)
Subjective:  Fever curve downt- UOP back to great- creatinine down but BUN up - awake , alert, asking for water on vent Objective Vital signs in last 24 hours: Filed Vitals:   01/18/14 0307 01/18/14 0400 01/18/14 0500 01/18/14 0600  BP: 117/67  124/72 133/79  Pulse: 97 99 96 96  Temp:  99.8 F (37.7 C) 99.7 F (37.6 C) 99.6 F (37.6 C)  TempSrc:      Resp: 17 15 14 17   Height:      Weight:    84.3 kg (185 lb 13.6 oz)  SpO2: 97% 96% 97% 97%   Weight change: -0.4 kg (-14.1 oz)  Intake/Output Summary (Last 24 hours) at 01/18/14 0717 Last data filed at 01/18/14 0600  Gross per 24 hour  Intake 1760.07 ml  Output   3375 ml  Net -1614.93 ml    Assessment/Plan: 53 year old black female with multiple medical issues including stage IV CKD. She presents with a complaint of being acutely unresponsive with respiratory failure requiring intubation.  1.Renal- known stage IV CKD at baseline with creatinine in the fours. Creatinine slightly worse than baseline at this time. She is nonoliguric There are no acute indications for dialysis, but if she continues to have very poor neurologic status and BUN worsening that may be indication to start HD- since we have an access that appears functional.    2. Hypertension/volume - does not appear to be significantly volume overloaded. Blood pressure is a little soft. Now with hypernatremia- needs more hypotonic fluids- change to D5 3. pulmonary - opacification of her right-sided airspace. Now fever. Started on antibiotics per CCM. Ventilator support for now. Question aspiration. ABG much improved 4 altered mental status- appears to be of acute onset. Not consistent with uremia. History of at least 600 mg of Neurontin and history of Neurontin toxicity this could be the culprit. Also just altered MS based on sickness 5. Hyperkalemia- better 6. Acidosis - both resp and metabolic- improved- bicarb stopped 7. Anemia- hgb has decreased with events- given aranesp  and iron stores low- will replete 8. Hypernatremia- free water and D5    Jennifer Lawson A    Labs: Basic Metabolic Panel:  Recent Labs Lab 01/15/14 1957 01/16/14 0450 01/17/14 0500 01/18/14 0410  NA 142 142 145 151*  K 4.7 4.7 3.6* 4.5  CL 108 110 109 112  CO2 18* 16* 20 25  GLUCOSE 99 113* 102* 101*  BUN 53* 60* 72* 84*  CREATININE 4.89* 5.67* 5.32* 4.81*  CALCIUM 9.8 9.8 9.0 10.1  PHOS 3.9  --   --  5.2*   Liver Function Tests:  Recent Labs Lab 01/15/14 1407 01/15/14 1957 01/17/14 0500 01/18/14 0410  AST 27 41* 19  --   ALT 11 16 12   --   ALKPHOS 67 67 56  --   BILITOT <0.2* <0.2* <0.2*  --   PROT 7.6 7.6 5.4*  --   ALBUMIN 2.9* 2.9* 1.9* 2.4*   No results found for this basename: LIPASE, AMYLASE,  in the last 168 hours No results found for this basename: AMMONIA,  in the last 168 hours CBC:  Recent Labs Lab 01/15/14 1407  01/16/14 0450 01/17/14 0500 01/18/14 0410  WBC 6.6  --  22.7* 18.9* 20.2*  NEUTROABS 6.0  --   --   --   --   HGB 10.9*  < > 9.6* 7.3* 8.2*  HCT 35.0*  < > 30.5* 22.2* 26.1*  MCV 99.2  --  95.0 93.7  94.2  PLT 320  --  302 203 231  < > = values in this interval not displayed. Cardiac Enzymes: No results found for this basename: CKTOTAL, CKMB, CKMBINDEX, TROPONINI,  in the last 168 hours CBG:  Recent Labs Lab 01/17/14 1244 01/17/14 1546 01/17/14 2035 01/17/14 2357 01/18/14 0426  GLUCAP 80 100* 100* 107* 109*    Iron Studies:   Recent Labs  01/17/14 0500  IRON <10*  TIBC Not calculated due to Iron <10.  FERRITIN 81   Studies/Results: Mr Brain Wo Contrast  01/16/2014   CLINICAL DATA:  Altered mental status. Unresponsive last night. The patient has been intubated in requires ventilatory support.  EXAM: MRI HEAD WITHOUT CONTRAST  TECHNIQUE: Multiplanar, multiecho pulse sequences of the brain and surrounding structures were obtained without intravenous contrast.  COMPARISON:  CT head without contrast 01/15/2014. MRI  brain 01/19/2013.  FINDINGS: The diffusion-weighted images demonstrate no evidence for acute or subacute infarction.  No hemorrhage or mass lesion is present. The ventricles are of normal size. There is no significant white matter disease. No significant extra-axial fluid collection is present.  Flow is present in the major intracranial arteries. The patient is status post scratch of the globes and orbits are intact.  Mild mucosal thickening and a small polyp is noted posteriorly in the maxillary sinuses bilaterally. There is mild mucosal thickening within the posterior ethmoid air cells as well. The remaining paranasal sinuses are clear. There is some fluid in left mastoid air cells. No obstructing nasopharyngeal lesion is evident.  IMPRESSION: 1. Normal MRI appearance the brain. 2. Mild sinus disease. 3. Minimal fluid in the left mastoid air cells.   Electronically Signed   By: Lawrence Santiago M.D.   On: 01/16/2014 17:15   Dg Chest Port 1 View  01/17/2014   CLINICAL DATA:  Respiratory failure  EXAM: PORTABLE CHEST - 1 VIEW  COMPARISON:  01/16/2014  FINDINGS: Endotracheal tube is again noted 3.2 cm above the carina. A right jugular line is seen in the mid superior vena cava. A nasogastric catheter is seen in the gastric fundus. Cardiac shadow is stable. Persistent infiltrate is noted in the right perihilar region and right lung base. New retrocardiac infiltrate is noted when compare with the prior study.  IMPRESSION: Increasing infiltrate particularly in the left base.   Electronically Signed   By: Inez Catalina M.D.   On: 01/17/2014 07:12   Dg Abd Portable 1v  01/17/2014   CLINICAL DATA:  OG tube placement.  EXAM: PORTABLE ABDOMEN - 1 VIEW  COMPARISON:  09/21/2013  FINDINGS: Enteric tube tip is in the left upper quadrant consistent with location in the mid stomach. Atelectasis is suggested in both lung bases. Bowel gas pattern is normal.  IMPRESSION: Enteric tube tip in the left upper quadrant consistent with  location in the stomach.   Electronically Signed   By: Lucienne Capers M.D.   On: 01/17/2014 05:37   Medications: Infusions: . sodium chloride 10 mL/hr at 01/16/14 1800  . propofol 10 mcg/kg/min (01/18/14 0600)    Scheduled Medications: . ampicillin-sulbactam (UNASYN) IV  3 g Intravenous Q24H  . antiseptic oral rinse  7 mL Mouth Rinse QID  . chlorhexidine  15 mL Mouth Rinse BID  . darbepoetin (ARANESP) injection - NON-DIALYSIS  100 mcg Subcutaneous Q Thu-1800  . famotidine  20 mg Per Tube QHS  . feeding supplement (PRO-STAT SUGAR FREE 64)  30 mL Per Tube QID  . feeding supplement (VITAL HIGH PROTEIN)  1,000 mL Per Tube Q24H  . free water  200 mL Per Tube 3 times per day  . ipratropium-albuterol  3 mL Nebulization Q6H    have reviewed scheduled and prn medications.  Physical Exam: General: alert , asking for water Heart: tachy Lungs: CBS bilat Abdomen: soft, non tender, non distended Extremities: no significant peripheral edema Dialysis Access: right upper arm AVF- good thrill and bruit    01/18/2014,7:17 AM  LOS: 3 days

## 2014-01-18 NOTE — Progress Notes (Signed)
NEURO HOSPITALIST PROGRESS NOTE   SUBJECTIVE:                                                                                          Remains intubated but alert and awake and following commands. Afebrile today.   OBJECTIVE:                                                                                             Vital signs in last 24 hours: Temp:  [98.3 F (36.8 C)-99.8 F (37.7 C)] 99.6 F (37.6 C) (09/12 0600) Pulse Rate:  [91-103] 96 (09/12 0600) Resp:  [0-20] 17 (09/12 0600) BP: (109-138)/(60-82) 133/79 mmHg (09/12 0600) SpO2:  [96 %-100 %] 97 % (09/12 0600) FiO2 (%):  [40 %] 40 % (09/12 0600) Weight:  [84.3 kg (185 lb 13.6 oz)] 84.3 kg (185 lb 13.6 oz) (09/12 0600)  Intake/Output from previous day: 09/11 0701 - 09/12 0700 In: 1760.1 [I.V.:848.4; NG/GT:811.7; IV Piggyback:100] Out: 6237 [Urine:3375] Intake/Output this shift:   Nutritional status:    Past Medical History  Diagnosis Date  . Mental disorder   . Depression   . Hypertension   . Overdose   . Tobacco use disorder 11/13/2012  . Complication of anesthesia     difficulty going to sleep  . Chronic kidney disease     06/11/13- not on dialysis  . Shortness of breath     lying down flat  . Diabetes mellitus without complication     denies  . PTSD (post-traumatic stress disorder)   . Asthma   . COPD (chronic obstructive pulmonary disease)   . Heart murmur   . GERD (gastroesophageal reflux disease)   . Seizures     "passsed out"  . History of blood transfusion     Neurologic Exam:  Mental status: on low dose propofol, but alert and awake and follows commands.  CN 2-12: pupils 3-4 mm, reactive. No gaze preference. Blinks to threat. No obvious face weakness. Tongue: intubated.  Motor: moves all limbs on painful stimuli.  Sensory: withdraws to pain.  Plantars: downgoing.  Coordination and gait: no tested.   Lab Results: No results found for this basename: cbc,  bmp, coags, chol, tri, ldl, hga1c   Lipid Panel  Recent Labs  01/15/14 1957  TRIG 173*    Studies/Results: Mr Brain Wo Contrast  01/16/2014   CLINICAL DATA:  Altered mental status. Unresponsive last night. The patient has been intubated in requires ventilatory support.  EXAM: MRI HEAD WITHOUT CONTRAST  TECHNIQUE: Multiplanar, multiecho pulse sequences  of the brain and surrounding structures were obtained without intravenous contrast.  COMPARISON:  CT head without contrast 01/15/2014. MRI brain 01/19/2013.  FINDINGS: The diffusion-weighted images demonstrate no evidence for acute or subacute infarction.  No hemorrhage or mass lesion is present. The ventricles are of normal size. There is no significant white matter disease. No significant extra-axial fluid collection is present.  Flow is present in the major intracranial arteries. The patient is status post scratch of the globes and orbits are intact.  Mild mucosal thickening and a small polyp is noted posteriorly in the maxillary sinuses bilaterally. There is mild mucosal thickening within the posterior ethmoid air cells as well. The remaining paranasal sinuses are clear. There is some fluid in left mastoid air cells. No obstructing nasopharyngeal lesion is evident.  IMPRESSION: 1. Normal MRI appearance the brain. 2. Mild sinus disease. 3. Minimal fluid in the left mastoid air cells.   Electronically Signed   By: Lawrence Santiago M.D.   On: 01/16/2014 17:15   Dg Chest Port 1 View  01/17/2014   CLINICAL DATA:  Respiratory failure  EXAM: PORTABLE CHEST - 1 VIEW  COMPARISON:  01/16/2014  FINDINGS: Endotracheal tube is again noted 3.2 cm above the carina. A right jugular line is seen in the mid superior vena cava. A nasogastric catheter is seen in the gastric fundus. Cardiac shadow is stable. Persistent infiltrate is noted in the right perihilar region and right lung base. New retrocardiac infiltrate is noted when compare with the prior study.  IMPRESSION:  Increasing infiltrate particularly in the left base.   Electronically Signed   By: Inez Catalina M.D.   On: 01/17/2014 07:12   Dg Abd Portable 1v  01/17/2014   CLINICAL DATA:  OG tube placement.  EXAM: PORTABLE ABDOMEN - 1 VIEW  COMPARISON:  09/21/2013  FINDINGS: Enteric tube tip is in the left upper quadrant consistent with location in the mid stomach. Atelectasis is suggested in both lung bases. Bowel gas pattern is normal.  IMPRESSION: Enteric tube tip in the left upper quadrant consistent with location in the stomach.   Electronically Signed   By: Lucienne Capers M.D.   On: 01/17/2014 05:37    MEDICATIONS                                                                                          Scheduled: . ampicillin-sulbactam (UNASYN) IV  3 g Intravenous Q24H  . antiseptic oral rinse  7 mL Mouth Rinse QID  . chlorhexidine  15 mL Mouth Rinse BID  . darbepoetin (ARANESP) injection - NON-DIALYSIS  100 mcg Subcutaneous Q Thu-1800  . famotidine  20 mg Per Tube QHS  . feeding supplement (PRO-STAT SUGAR FREE 64)  30 mL Per Tube QID  . feeding supplement (VITAL HIGH PROTEIN)  1,000 mL Per Tube Q24H  . ferric gluconate (FERRLECIT/NULECIT) IV  125 mg Intravenous Daily  . free water  400 mL Per Tube Q6H  . ipratropium-albuterol  3 mL Nebulization Q6H    ASSESSMENT/PLAN:  53 y/o with known stage IV CKD admitted with altered mental status and myoclonic-like movements left shoulder-neck (no visible on today exam). No evidence of electrographic seizures on c-EEG. MRI brain normal. 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  Mental status continuously improving. Will sign off.  Dorian Pod, MD [facility] [phone]  01/18/2014, 8:09 AM

## 2014-01-18 NOTE — Progress Notes (Signed)
CRITICAL VALUE ALERT  Critical value received:  T=102.7 (one hour after tylenol given)  Date of notification:  01/18/2014   Time of notification:  4:57 PM  Critical value read back:Yes.    Nurse who received alert:  Barrie Lyme, RN  MD notified (1st page):  Dr. Melvyn Novas [facility])  Time of first page:  1645  MD notified (2nd page):  Time of second page:  Responding MD:  Dr. Melvyn Novas [facility])  Time MD responded:  1645  MD advised that he would continue to assess and look further into the patient's chart.

## 2014-01-18 NOTE — Progress Notes (Addendum)
ANTIBIOTIC CONSULT NOTE - FOLLOW UP  Pharmacy Consult for unasyn Indication: PNA  Allergies  Allergen Reactions  . Codeine Sulfate Anaphylaxis    Daughter called about having this allergy   . Haldol [Haloperidol Lactate] Shortness Of Breath  . Risperidone And Related Shortness Of Breath    Patient Measurements: Height: 5\' 4"  (162.6 cm) Weight: 185 lb 13.6 oz (84.3 kg) IBW/kg (Calculated) : 54.7  Vital Signs: Temp: 99.8 F (37.7 C) (09/12 1100) Temp src: Core (Comment) (09/12 0800) BP: 142/78 mmHg (09/12 1000) Pulse Rate: 105 (09/12 1238) Intake/Output from previous day: 09/11 0701 - 09/12 0700 In: 1795 [I.V.:863.3; NG/GT:831.7; IV Piggyback:100] Out: 9563 [Urine:3375] Intake/Output from this shift: Total I/O In: 719.5 [I.V.:289.5; Other:100; NG/GT:230; IV Piggyback:100] Out: 700 [Urine:700]  Labs:  Recent Labs  01/16/14 0450 01/17/14 0500 01/18/14 0410  WBC 22.7* 18.9* 20.2*  HGB 9.6* 7.3* 8.2*  PLT 302 203 231  CREATININE 5.67* 5.32* 4.81*   Estimated Creatinine Clearance: 14 ml/min (by C-G formula based on Cr of 4.81). No results found for this basename: VANCOTROUGH, VANCOPEAK, VANCORANDOM, [place], [place], GENTRANDOM, [place], TOBRAPEAK, TOBRARND, AMIKACINPEAK, AMIKACINTROU, AMIKACIN,  in the last 72 hours   Microbiology: Recent Results (from the past 720 hour(s))  CULTURE, BLOOD (ROUTINE X 2)     Status: None   Collection Time    01/15/14  3:01 PM      Result Value Ref Range Status   Specimen Description BLOOD HAND LEFT   Final   Special Requests BOTTLES DRAWN AEROBIC ONLY 5CC   Final   Culture  Setup Time     Final   Value: 01/15/2014 19:24     Performed at [facility]   Culture     Final   Value:        BLOOD CULTURE RECEIVED NO GROWTH TO DATE CULTURE WILL BE HELD FOR 5 DAYS BEFORE ISSUING A FINAL NEGATIVE REPORT     Performed at [facility]   Report Status PENDING   Incomplete  CULTURE, BLOOD (ROUTINE X 2)     Status:  None   Collection Time    01/15/14  3:08 PM      Result Value Ref Range Status   Specimen Description BLOOD WRIST LEFT   Final   Special Requests BOTTLES DRAWN AEROBIC ONLY 5CC   Final   Culture  Setup Time     Final   Value: 01/15/2014 19:24     Performed at [facility]   Culture     Final   Value:        BLOOD CULTURE RECEIVED NO GROWTH TO DATE CULTURE WILL BE HELD FOR 5 DAYS BEFORE ISSUING A FINAL NEGATIVE REPORT     Performed at [facility]   Report Status PENDING   Incomplete  URINE CULTURE     Status: None   Collection Time    01/15/14  4:00 PM      Result Value Ref Range Status   Specimen Description URINE, RANDOM   Final   Special Requests NONE   Final   Culture  Setup Time     Final   Value: 01/15/2014 20:36     Performed at [place]     Final   Value: NO GROWTH     Performed at [facility]   Culture     Final   Value: NO GROWTH     Performed at [facility]  Report Status 01/16/2014 FINAL   Final  MRSA PCR SCREENING     Status: None   Collection Time    01/15/14  5:57 PM      Result Value Ref Range Status   MRSA by PCR NEGATIVE  NEGATIVE Final   Comment:            The GeneXpert MRSA Assay (FDA     approved for NASAL specimens     only), is one component of a     comprehensive MRSA colonization     surveillance program. It is not     intended to diagnose MRSA     infection nor to guide or     monitor treatment for     MRSA infections.  CULTURE, RESPIRATORY (NON-EXPECTORATED)     Status: None   Collection Time    01/16/14 12:25 PM      Result Value Ref Range Status   Specimen Description TRACHEAL ASPIRATE   Final   Special Requests NONE   Final   Gram Stain     Final   Value: MODERATE WBC PRESENT,BOTH PMN AND MONONUCLEAR     RARE SQUAMOUS EPITHELIAL CELLS PRESENT     NO ORGANISMS SEEN     Performed at [facility]   Culture     Final   Value: Culture reincubated for better growth      Performed at [facility]   Report Status PENDING   Incomplete    Anti-infectives   Start     Dose/Rate Route Frequency Ordered Stop   01/17/14 1600  vancomycin (VANCOCIN) IVPB 1000 mg/200 mL premix  Status:  Discontinued     1,000 mg 200 mL/hr over 60 Minutes Intravenous Every 48 hours 01/15/14 1632 01/17/14 1002   01/15/14 2000  Ampicillin-Sulbactam (UNASYN) 3 g in sodium chloride 0.9 % 100 mL IVPB     3 g 100 mL/hr over 60 Minutes Intravenous Every 24 hours 01/15/14 1629     01/15/14 1530  vancomycin (VANCOCIN) 1,500 mg in sodium chloride 0.9 % 500 mL IVPB     1,500 mg 250 mL/hr over 120 Minutes Intravenous  Once 01/15/14 1529 01/15/14 1815   01/15/14 1515  piperacillin-tazobactam (ZOSYN) IVPB 3.375 g     3.375 g 100 mL/hr over 30 Minutes Intravenous  Once 01/15/14 1512 01/15/14 1625      Assessment: 53 yo female with suspected aspiration PNA on unasyn.  WBC= 20.2, afeb, SCr= 4.81, CrCl ~ 15, cultures NGTD and CXR on 9/11 with increasing infiltrate. Noted possible plans for broadening coverage.  Zosyn 9/9 x 1 Vancomycin 9/9 >> 9/11 Unasyn 9/9 >>  9/9 Urine - neg 9/9 Blood x 2 - ngtd 9/10 Resp Cx - ngtd  Plan:  -Continue Unasyn 3gm IV q24hr -Will follow renal function, cultures and clinical progress  Hildred Laser, Pharm D 01/18/2014 2:05 PM  Addendum -Change to zosyn due to continued fever (tmax= 102.7)  Plan -Zosyn 2.25gm IV q6h -Will follow renal function, cultures and clinical progress  Hildred Laser, Pharm D 01/18/2014 6:06 PM

## 2014-01-19 ENCOUNTER — Inpatient Hospital Stay (HOSPITAL_COMMUNITY): Payer: Medicare Other

## 2014-01-19 LAB — GLUCOSE, CAPILLARY
Glucose-Capillary: 107 mg/dL — ABNORMAL HIGH (ref 70–99)
Glucose-Capillary: 109 mg/dL — ABNORMAL HIGH (ref 70–99)
Glucose-Capillary: 117 mg/dL — ABNORMAL HIGH (ref 70–99)
Glucose-Capillary: 141 mg/dL — ABNORMAL HIGH (ref 70–99)
Glucose-Capillary: 81 mg/dL (ref 70–99)
Glucose-Capillary: 96 mg/dL (ref 70–99)

## 2014-01-19 LAB — COMPREHENSIVE METABOLIC PANEL
ALT: 15 U/L (ref 0–35)
AST: 19 U/L (ref 0–37)
Albumin: 1.9 g/dL — ABNORMAL LOW (ref 3.5–5.2)
Alkaline Phosphatase: 88 U/L (ref 39–117)
Anion gap: 12 (ref 5–15)
BUN: 79 mg/dL — ABNORMAL HIGH (ref 6–23)
CO2: 24 mEq/L (ref 19–32)
Calcium: 9.4 mg/dL (ref 8.4–10.5)
Chloride: 110 mEq/L (ref 96–112)
Creatinine, Ser: 3.85 mg/dL — ABNORMAL HIGH (ref 0.50–1.10)
GFR calc Af Amer: 14 mL/min — ABNORMAL LOW (ref >90)
GFR calc non Af Amer: 12 mL/min — ABNORMAL LOW (ref >90)
Glucose, Bld: 126 mg/dL — ABNORMAL HIGH (ref 70–99)
Potassium: 4.2 mEq/L (ref 3.7–5.3)
Sodium: 146 mEq/L (ref 137–147)
Total Bilirubin: <0.2 mg/dL — ABNORMAL LOW (ref 0.3–1.2)
Total Protein: 5.6 g/dL — ABNORMAL LOW (ref 6.0–8.3)

## 2014-01-19 LAB — CBC WITH DIFFERENTIAL/PLATELET
Basophils Absolute: 0.1 10*3/uL (ref 0.0–0.1)
Basophils Relative: 1 % (ref 0–1)
Eosinophils Absolute: 0.4 10*3/uL (ref 0.0–0.7)
Eosinophils Relative: 3 % (ref 0–5)
HCT: 22.4 % — ABNORMAL LOW (ref 36.0–46.0)
Hemoglobin: 7.1 g/dL — ABNORMAL LOW (ref 12.0–15.0)
Lymphocytes Relative: 7 % — ABNORMAL LOW (ref 12–46)
Lymphs Abs: 0.8 10*3/uL (ref 0.7–4.0)
MCH: 31.1 pg (ref 26.0–34.0)
MCHC: 31.7 g/dL (ref 30.0–36.0)
MCV: 98.2 fL (ref 78.0–100.0)
Monocytes Absolute: 0.4 10*3/uL (ref 0.1–1.0)
Monocytes Relative: 4 % (ref 3–12)
Neutro Abs: 10.1 10*3/uL — ABNORMAL HIGH (ref 1.7–7.7)
Neutrophils Relative %: 86 % — ABNORMAL HIGH (ref 43–77)
Platelets: 174 10*3/uL (ref 150–400)
RBC: 2.28 MIL/uL — ABNORMAL LOW (ref 3.87–5.11)
RDW: 16.6 % — ABNORMAL HIGH (ref 11.5–15.5)
WBC: 11.8 10*3/uL — ABNORMAL HIGH (ref 4.0–10.5)

## 2014-01-19 LAB — CULTURE, RESPIRATORY W GRAM STAIN

## 2014-01-19 LAB — POCT I-STAT 3, ART BLOOD GAS (G3+)
Acid-Base Excess: 1 mmol/L (ref 0.0–2.0)
Bicarbonate: 24.9 mEq/L — ABNORMAL HIGH (ref 20.0–24.0)
O2 Saturation: 98 %
Patient temperature: 98.2
TCO2: 26 mmol/L (ref 0–100)
pCO2 arterial: 35.4 mmHg (ref 35.0–45.0)
pH, Arterial: 7.455 — ABNORMAL HIGH (ref 7.350–7.450)
pO2, Arterial: 93 mmHg (ref 80.0–100.0)

## 2014-01-19 LAB — TRIGLYCERIDES: Triglycerides: 248 mg/dL — ABNORMAL HIGH (ref <150)

## 2014-01-19 MED ORDER — VANCOMYCIN HCL IN DEXTROSE 1-5 GM/200ML-% IV SOLN
1000.0000 mg | INTRAVENOUS | Status: DC
  Filled 2014-01-19: qty 200

## 2014-01-19 MED ORDER — DIVALPROEX SODIUM ER 500 MG PO TB24
500.0000 mg | ORAL_TABLET | Freq: Every day | ORAL | Status: DC
  Administered 2014-01-20: 500 mg via ORAL
  Filled 2014-01-19: qty 1

## 2014-01-19 MED ORDER — CHLORHEXIDINE GLUCONATE 0.12 % MT SOLN
15.0000 mL | Freq: Two times a day (BID) | OROMUCOSAL | Status: DC
  Administered 2014-01-19: 15 mL via OROMUCOSAL
  Filled 2014-01-19: qty 15

## 2014-01-19 MED ORDER — CETYLPYRIDINIUM CHLORIDE 0.05 % MT LIQD
7.0000 mL | Freq: Two times a day (BID) | OROMUCOSAL | Status: DC
  Administered 2014-01-19: 7 mL via OROMUCOSAL

## 2014-01-19 MED ORDER — VANCOMYCIN HCL 10 G IV SOLR
1500.0000 mg | Freq: Once | INTRAVENOUS | Status: AC
  Administered 2014-01-19: 1500 mg via INTRAVENOUS
  Filled 2014-01-19: qty 1500

## 2014-01-19 MED ORDER — FREE WATER: 400.0000 mL | Freq: Three times a day (TID) | Status: DC

## 2014-01-19 NOTE — Progress Notes (Addendum)
ANTIBIOTIC CONSULT NOTE - FOLLOW UP  Pharmacy Consult for zosyn, vancomycin Indication: PNA  Allergies  Allergen Reactions  . Codeine Sulfate Anaphylaxis    Daughter called about having this allergy   . Haldol [Haloperidol Lactate] Shortness Of Breath  . Risperidone And Related Shortness Of Breath    Patient Measurements: Height: 5\' 4"  (162.6 cm) Weight: 189 lb 13.1 oz (86.1 kg) IBW/kg (Calculated) : 54.7  Vital Signs: Temp: 98.5 F (36.9 C) (09/13 1500) Temp src: Core (Comment) (09/13 1200) BP: 154/98 mmHg (09/13 1500) Pulse Rate: 96 (09/13 1500) Intake/Output from previous day: 09/12 0701 - 09/13 0700 In: 4354.1 [I.V.:2114.1; NG/GT:1730; IV Piggyback:250] Out: 1856 [Urine:3190] Intake/Output from this shift: Total I/O In: 876.9 [I.V.:576.9; NG/GT:150; IV Piggyback:150] Out: 825 [Urine:825]  Labs:  Recent Labs  01/17/14 0500 01/18/14 0410 01/19/14 0555  WBC 18.9* 20.2* 11.8*  HGB 7.3* 8.2* 7.1*  PLT 203 231 174  CREATININE 5.32* 4.81* 3.85*   Estimated Creatinine Clearance: 17.7 ml/min (by C-G formula based on Cr of 3.85). No results found for this basename: VANCOTROUGH, Corlis Leak, VANCORANDOM, [place], [place], [place], [place], [place], TOBRARND, AMIKACINPEAK, AMIKACINTROU, AMIKACIN,  in the last 72 hours   Microbiology: Recent Results (from the past 720 hour(s))  CULTURE, BLOOD (ROUTINE X 2)     Status: None   Collection Time    01/15/14  3:01 PM      Result Value Ref Range Status   Specimen Description BLOOD HAND LEFT   Final   Special Requests BOTTLES DRAWN AEROBIC ONLY 5CC   Final   Culture  Setup Time     Final   Value: 01/15/2014 19:24     Performed at [facility]   Culture     Final   Value:        BLOOD CULTURE RECEIVED NO GROWTH TO DATE CULTURE WILL BE HELD FOR 5 DAYS BEFORE ISSUING A FINAL NEGATIVE REPORT     Performed at [facility]   Report Status PENDING   Incomplete  CULTURE, BLOOD (ROUTINE X 2)     Status:  None   Collection Time    01/15/14  3:08 PM      Result Value Ref Range Status   Specimen Description BLOOD WRIST LEFT   Final   Special Requests BOTTLES DRAWN AEROBIC ONLY 5CC   Final   Culture  Setup Time     Final   Value: 01/15/2014 19:24     Performed at [facility]   Culture     Final   Value: GRAM POSITIVE COCCI IN CLUSTERS     Note: Gram Stain Report Called to,Read Back By and Verified With: Delphia Grates RN on 01/19/14 at 05:58 by Rise Mu     Performed at [facility]   Report Status PENDING   Incomplete  URINE CULTURE     Status: None   Collection Time    01/15/14  4:00 PM      Result Value Ref Range Status   Specimen Description URINE, RANDOM   Final   Special Requests NONE   Final   Culture  Setup Time     Final   Value: 01/15/2014 20:36     Performed at [place]     Final   Value: NO GROWTH     Performed at [facility]   Culture     Final   Value: NO GROWTH     Performed at [facility]   Report Status 01/16/2014 FINAL   Final  MRSA PCR SCREENING     Status: None   Collection Time    01/15/14  5:57 PM      Result Value Ref Range Status   MRSA by PCR NEGATIVE  NEGATIVE Final   Comment:            The GeneXpert MRSA Assay (FDA     approved for NASAL specimens     only), is one component of a     comprehensive MRSA colonization     surveillance program. It is not     intended to diagnose MRSA     infection nor to guide or     monitor treatment for     MRSA infections.  CULTURE, RESPIRATORY (NON-EXPECTORATED)     Status: None   Collection Time    01/16/14 12:25 PM      Result Value Ref Range Status   Specimen Description TRACHEAL ASPIRATE   Final   Special Requests NONE   Final   Gram Stain     Final   Value: MODERATE WBC PRESENT,BOTH PMN AND MONONUCLEAR     RARE SQUAMOUS EPITHELIAL CELLS PRESENT     NO ORGANISMS SEEN     Performed at [facility]   Culture     Final   Value:  Non-Pathogenic Oropharyngeal-type Flora Isolated.     Performed at [facility]   Report Status 01/19/2014 FINAL   Final    Anti-infectives   Start     Dose/Rate Route Frequency Ordered Stop   01/18/14 1900  piperacillin-tazobactam (ZOSYN) IVPB 2.25 g     2.25 g 100 mL/hr over 30 Minutes Intravenous Every 6 hours 01/18/14 1808     01/18/14 1745  piperacillin-tazobactam (ZOSYN) IVPB 3.375 g  Status:  Discontinued     3.375 g 12.5 mL/hr over 240 Minutes Intravenous 3 times per day 01/18/14 1741 01/18/14 1807   01/17/14 1600  vancomycin (VANCOCIN) IVPB 1000 mg/200 mL premix  Status:  Discontinued     1,000 mg 200 mL/hr over 60 Minutes Intravenous Every 48 hours 01/15/14 1632 01/17/14 1002   01/15/14 2000  Ampicillin-Sulbactam (UNASYN) 3 g in sodium chloride 0.9 % 100 mL IVPB  Status:  Discontinued     3 g 100 mL/hr over 60 Minutes Intravenous Every 24 hours 01/15/14 1629 01/18/14 1808   01/15/14 1530  vancomycin (VANCOCIN) 1,500 mg in sodium chloride 0.9 % 500 mL IVPB     1,500 mg 250 mL/hr over 120 Minutes Intravenous  Once 01/15/14 1529 01/15/14 1815   01/15/14 1515  piperacillin-tazobactam (ZOSYN) IVPB 3.375 g     3.375 g 100 mL/hr over 30 Minutes Intravenous  Once 01/15/14 1512 01/15/14 1625      Assessment: 53 yo female with suspected aspiration PNA on zosyn.  WBC= 11.8, tmax= 102.7, SCr= 3.85, CrCl ~ 15-20, and blood cultures with GPC/clusters 1/2. Pharmacy to dose vancomycin. Patient noted with CKD with SCr= 3,85, CrCL ~ 15 and UOP noted as 1.5 ml/kg/hr.   Zosyn 9/9 x 1 Vancomycin 9/9 >> 9/11 Unasyn 9/9 >>  9/9 Urine - neg 9/9 Blood x 2 - GPC/clusters 1/2 9/10 Resp Cx - non-path flora   Plan:  -Vancomycin 1500mg  IV x1 followed by 1000mg  IV q48hr -Continue Zosyn 2.25gm IV q6h -Will follow renal function, cultures and clinical progress  Hildred Laser, Pharm D 01/19/2014 4:59 PM

## 2014-01-19 NOTE — Progress Notes (Signed)
CRITICAL VALUE ALERT  Critical value received:  Blood culture Gram Positive Cocci in clusters  Date of notification:  01/19/14  Time of notification:  0558  Critical value read back:Yes.    Nurse who received alert:  Delphia Grates RN  MD notified (1st page):  Dr Tamala Julian  Time of first page:  0609  MD notified (2nd page):  Time of second page:  Responding MD:  Dr Tamala Julian  Time MD responded:  [phone]

## 2014-01-19 NOTE — Procedures (Signed)
Extubation Procedure Note  Patient Details:   Name: HAJRA PORT DOB: Feb 28, 1961 MRN: [id number]   Airway Documentation:     Evaluation  O2 sats: stable throughout Complications: No apparent complications Patient did tolerate procedure well. Bilateral Breath Sounds: Clear;Diminished Suctioning: Oral;Airway Yes pt able to speak, no stridor noted, no distress noted.    Lenna Sciara 01/19/2014, 10:08 AM

## 2014-01-19 NOTE — Progress Notes (Signed)
Subjective:  Fever returned- found to have bacteremia- UOP great- BUN and creatinine down, sodium down as well - sedated but rousable on vent   Objective Vital signs in last 24 hours: Filed Vitals:   01/19/14 0448 01/19/14 0500 01/19/14 0600 01/19/14 0700  BP:  120/65 106/58 110/59  Pulse: 93 93 88 87  Temp: 98.7 F (37.1 C) 98.7 F (37.1 C) 98.3 F (36.8 C) 98.1 F (36.7 C)  TempSrc:  Core (Comment)    Resp: 13 16 15 15   Height:      Weight:      SpO2: 99% 99% 99% 99%   Weight change: 1.8 kg (3 lb 15.5 oz)  Intake/Output Summary (Last 24 hours) at 01/19/14 0747 Last data filed at 01/19/14 0700  Gross per 24 hour  Intake 4354.08 ml  Output   3190 ml  Net 1164.08 ml    Assessment/Plan: 53 year old black female with multiple medical issues including stage IV CKD. She presents with Lawson complaint of being acutely unresponsive with respiratory failure requiring intubation.  1.Renal- known stage IV CKD at baseline with creatinine in the fours. Creatinine was slightly worse than baseline, now improving. She is nonoliguric There are no acute indications for dialysis, but if she continues to have very poor neurologic status and BUN worsening that may be indication to start HD- since we have an access that appears functional.    2. Hypertension/volume - does not appear to be significantly volume overloaded. Blood pressure is Lawson little soft.  With hypernatremia- have changed IVF to D5 - will decrease some today 3. pulmonary - opacification of her right-sided airspace.  Fever and bacteremia (one bottle 4 days out) . Started on antibiotics per CCM- WBC coming down. Ventilator support for now. Question aspiration.  4 altered mental status- appears to be of acute onset. Not consistent with uremia. History of at least 600 mg of Neurontin and history of Neurontin toxicity this could be the culprit. Also just altered MS based on sickness- has improved 5. Hyperkalemia- better 6. Acidosis - both resp  and metabolic- improved- bicarb stopped 7. Anemia- hgb has decreased with events- given aranesp and iron stores low- are repleting 8. Hypernatremia- free water and D5 - is improved , will slow rate some   Jennifer Lawson    Labs: Basic Metabolic Panel:  Recent Labs Lab 01/15/14 1957  01/17/14 0500 01/18/14 0410 01/19/14 0555  NA 142  < > 145 151* 146  K 4.7  < > 3.6* 4.5 4.2  CL 108  < > 109 112 110  CO2 18*  < > 20 25 24   GLUCOSE 99  < > 102* 101* 126*  BUN 53*  < > 72* 84* 79*  CREATININE 4.89*  < > 5.32* 4.81* 3.85*  CALCIUM 9.8  < > 9.0 10.1 9.4  PHOS 3.9  --   --  5.2*  --   < > = values in this interval not displayed. Liver Function Tests:  Recent Labs Lab 01/15/14 1957 01/17/14 0500 01/18/14 0410 01/19/14 0555  AST 41* 19  --  19  ALT 16 12  --  15  ALKPHOS 67 56  --  88  BILITOT <0.2* <0.2*  --  <0.2*  PROT 7.6 5.4*  --  5.6*  ALBUMIN 2.9* 1.9* 2.4* 1.9*   No results found for this basename: LIPASE, AMYLASE,  in the last 168 hours No results found for this basename: AMMONIA,  in the last 168 hours CBC:  Recent  Labs Lab 01/15/14 1407  01/16/14 0450 01/17/14 0500 01/18/14 0410 01/19/14 0555  WBC 6.6  --  22.7* 18.9* 20.2* 11.8*  NEUTROABS 6.0  --   --   --   --  10.1*  HGB 10.9*  < > 9.6* 7.3* 8.2* 7.1*  HCT 35.0*  < > 30.5* 22.2* 26.1* 22.4*  MCV 99.2  --  95.0 93.7 94.2 98.2  PLT 320  --  302 203 231 174  < > = values in this interval not displayed. Cardiac Enzymes: No results found for this basename: CKTOTAL, CKMB, CKMBINDEX, TROPONINI,  in the last 168 hours CBG:  Recent Labs Lab 01/18/14 1204 01/18/14 1542 01/18/14 1928 01/18/14 2353 01/19/14 0403  GLUCAP 123* 85 112* 117* 107*    Iron Studies:   Recent Labs  01/17/14 0500  IRON <10*  TIBC Not calculated due to Iron <10.  FERRITIN 81   Studies/Results: No results found. Medications: Infusions: . sodium chloride 10 mL/hr at 01/16/14 1800  . dextrose 75 mL/hr at  01/18/14 2210  . propofol 20 mcg/kg/min (01/19/14 0700)    Scheduled Medications: . antiseptic oral rinse  7 mL Mouth Rinse QID  . chlorhexidine  15 mL Mouth Rinse BID  . darbepoetin (ARANESP) injection - NON-DIALYSIS  100 mcg Subcutaneous Q Thu-1800  . famotidine  20 mg Per Tube QHS  . feeding supplement (PRO-STAT SUGAR FREE 64)  30 mL Per Tube QID  . feeding supplement (VITAL HIGH PROTEIN)  1,000 mL Per Tube Q24H  . ferric gluconate (FERRLECIT/NULECIT) IV  125 mg Intravenous Daily  . free water  400 mL Per Tube Q6H  . ipratropium-albuterol  3 mL Nebulization Q6H  . piperacillin-tazobactam (ZOSYN)  IV  2.25 g Intravenous Q6H  . Valproic Acid  250 mg Oral BID    have reviewed scheduled and prn medications.  Physical Exam: General: more somnolent with propofol  Heart: tachy Lungs: CBS bilat Abdomen: soft, non tender, non distended Extremities: no significant peripheral edema Dialysis Access: right upper arm AVF- good thrill and bruit    01/19/2014,7:47 AM  LOS: 4 days

## 2014-01-19 NOTE — Progress Notes (Signed)
PULMONARY / CRITICAL CARE MEDICINE   Name: Jennifer Lawson MRN: [id number] DOB: 1961-03-06    ADMISSION DATE:  01/15/2014 CONSULTATION DATE:  01/15/2014  REFERRING MD :  EDP  CHIEF COMPLAINT:  AMS  INITIAL PRESENTATION: 53 yo with history of CKD, seizures and psych history presented to ED 9/9 after being found down at home - suspected. Last seen normal 9/8 PM. Family states she was She was unresponsive for EMS and intubated in ED. In ED CXR shows opacification of entire R airspace - suspected aspiration. PCCM asked to see for ICU admission.    SIGNIFICANT EVENTS/STUDIES:  9/09 intubated, admitted to ICU.  9/09 CT head: no acute intracranial abnormality  9/10 EEG: This EEG is consistent with diffuse non-specific cerebral dysfunction, and has features consistent with anesthetic sedation. No electrographic seizures were seen. 9/10 MRI brain: normal 9/11 low grade fever. Failed SBT  LINES/TUBES: ETT 9/09 >>  R IJ CVL 9/09 >>>9/13  MICRO: Urine 9/09 >> NEG Resp  9/10 >>  Blood 9/09 >> GPC clusters 1/2>>>>  ABX: Vanc 9/09 >> 9/11 Amp-sulbactam 9/09 >>9/12 Zosyn 9/12>>>   SUBJECTIVE:  Fever, BC 1/2 noted  VITAL SIGNS: Temp:  [98.1 F (36.7 C)-102.7 F (39.3 C)] 98.1 F (36.7 C) (09/13 0700) Pulse Rate:  [87-123] 87 (09/13 0700) Resp:  [12-25] 15 (09/13 0700) BP: (106-157)/(53-92) 110/59 mmHg (09/13 0700) SpO2:  [90 %-99 %] 99 % (09/13 0700) FiO2 (%):  [40 %] 40 % (09/13 0600) Weight:  [86.1 kg (189 lb 13.1 oz)] 86.1 kg (189 lb 13.1 oz) (09/13 0400) HEMODYNAMICS:   VENTILATOR SETTINGS: Vent Mode:  [-] PRVC FiO2 (%):  [40 %] 40 % Set Rate:  [12 bmp] 12 bmp Vt Set:  [440 mL] 440 mL PEEP:  [5 cmH20] 5 cmH20 Pressure Support:  [10 cmH20] 10 cmH20 Plateau Pressure:  [15 cmH20-16 cmH20] 15 cmH20 INTAKE / OUTPUT:  Intake/Output Summary (Last 24 hours) at 01/19/14 0753 Last data filed at 01/19/14 0700  Gross per 24 hour  Intake 4354.08 ml  Output   3190 ml  Net  1164.08 ml    PHYSICAL EXAMINATION: General:  Sedated, follows commands, rass 1 to -1 Neuro:  MAEs, no focal deficits.  HEENT:  jvd slight increased Cardiovascular:  s1 s2 RRR Lungs: rhonchi  reduced  right Abdomen: soft, NT, +BS Ext: no edema, AV fistula RUE   LABS: I have reviewed all of today's lab results. Relevant abnormalities are discussed in the A/P section  CXR: rt hilar infiltrate, ett wnl   ASSESSMENT / PLAN:  PULMONARY A:  Acute respiratory failure Aspiration Pneumonia rt COPD, Tobacco abuse disorder P:   Wean attempt cpap 5 ps 5, follow secretion status pcxr in am for follow up infiltrate Allow chest pt to dc If fail PS 5 escalate  CARDIOVASCULAR A:  Tachycardia, resolved H/o HTN Chronic beta blocker use P:  PRN metoprolol to maintain HR < 115/min sys would not tolerate BB oral yet  RENAL A:   CRI ATN resolving back to baseline P:   Renal service following. d5w to reduce, na better Allow pos balance  GASTROINTESTINAL A:   GERD P:   SUP: enteral famotidine Cont TFs per protocol, may hold fi weaning well Monitor for BM- noted x 2 ppi  HEMATOLOGIC A:   Anemia due to renal failure Precipitous drop in Hgb 9/11 (9.6 > 7.3) dilutional drops P:  DVT px: SCDs Cbc in am  Agree to reduce d5w  INFECTIOUS A:  Aspiration PNA, Likely BC are contaminant 1/2 at day 4 P:   Keep vanc, zosyn, follow ID Dc line anyway as not utilizing this am with pressors Repeat BC in am after line out  ENDOCRINE A:   DM II P:   Cont SSI protocol  NEUROLOGIC A:  Acute encephalopathy Seizure D/O Suspected seizure on admission Suspected gabapentin toxicity Subtherapeutic VPA level on admission H/o depression, PTSD, psychosis H/o OD P:   RASS goal:-1 Holding home meds (geodon, gabapentin) propofol dc Anticonvulsant therapy per Neuro  TODAY'S SUMMARY: WUA< SBT, dc line, keep same ABX< repeat bc in am    I have personally obtained a history,  examined the patient, evaluated laboratory and imaging results, formulated the assessment and plan and placed orders. CRITICAL CARE: The patient is critically ill with multiple organ systems failure and requires high complexity decision making for assessment and support, frequent evaluation and titration of therapies, application of advanced monitoring technologies and extensive interpretation of multiple databases. Critical Care Time devoted to patient care services described in this note is 30 minutes.   Lavon Paganini. Titus Mould, MD, [place] Pgr: [facility] '

## 2014-01-20 ENCOUNTER — Inpatient Hospital Stay (HOSPITAL_COMMUNITY): Payer: Medicare Other

## 2014-01-20 DIAGNOSIS — N189 Chronic kidney disease, unspecified: Secondary | ICD-10-CM

## 2014-01-20 LAB — GLUCOSE, CAPILLARY
Glucose-Capillary: 87 mg/dL (ref 70–99)
Glucose-Capillary: 87 mg/dL (ref 70–99)
Glucose-Capillary: 96 mg/dL (ref 70–99)
Glucose-Capillary: 97 mg/dL (ref 70–99)

## 2014-01-20 LAB — CBC WITH DIFFERENTIAL/PLATELET
Basophils Absolute: 0 10*3/uL (ref 0.0–0.1)
Basophils Relative: 0 % (ref 0–1)
Eosinophils Absolute: 0.5 10*3/uL (ref 0.0–0.7)
Eosinophils Relative: 5 % (ref 0–5)
HCT: 23.7 % — ABNORMAL LOW (ref 36.0–46.0)
Hemoglobin: 7.3 g/dL — ABNORMAL LOW (ref 12.0–15.0)
Lymphocytes Relative: 17 % (ref 12–46)
Lymphs Abs: 1.6 10*3/uL (ref 0.7–4.0)
MCH: 29.6 pg (ref 26.0–34.0)
MCHC: 30.8 g/dL (ref 30.0–36.0)
MCV: 96 fL (ref 78.0–100.0)
Monocytes Absolute: 0.7 10*3/uL (ref 0.1–1.0)
Monocytes Relative: 7 % (ref 3–12)
Neutro Abs: 6.6 10*3/uL (ref 1.7–7.7)
Neutrophils Relative %: 71 % (ref 43–77)
Platelets: 181 10*3/uL (ref 150–400)
RBC: 2.47 MIL/uL — ABNORMAL LOW (ref 3.87–5.11)
RDW: 16.2 % — ABNORMAL HIGH (ref 11.5–15.5)
WBC: 9.4 10*3/uL (ref 4.0–10.5)

## 2014-01-20 LAB — BASIC METABOLIC PANEL
Anion gap: 14 (ref 5–15)
BUN: 64 mg/dL — ABNORMAL HIGH (ref 6–23)
CO2: 21 mEq/L (ref 19–32)
Calcium: 9.6 mg/dL (ref 8.4–10.5)
Chloride: 107 mEq/L (ref 96–112)
Creatinine, Ser: 3.3 mg/dL — ABNORMAL HIGH (ref 0.50–1.10)
GFR calc Af Amer: 17 mL/min — ABNORMAL LOW (ref >90)
GFR calc non Af Amer: 15 mL/min — ABNORMAL LOW (ref >90)
Glucose, Bld: 79 mg/dL (ref 70–99)
Potassium: 4.1 mEq/L (ref 3.7–5.3)
Sodium: 142 mEq/L (ref 137–147)

## 2014-01-20 LAB — PARATHYROID HORMONE, INTACT (NO CA): PTH: 331 pg/mL — ABNORMAL HIGH (ref 14–64)

## 2014-01-20 MED ORDER — AMLODIPINE BESYLATE 10 MG PO TABS
10.0000 mg | ORAL_TABLET | Freq: Every day | ORAL | Status: DC
  Administered 2014-01-20 – 2014-01-22 (×3): 10 mg via ORAL
  Filled 2014-01-20 (×3): qty 1

## 2014-01-20 MED ORDER — DIVALPROEX SODIUM ER 500 MG PO TB24
500.0000 mg | ORAL_TABLET | Freq: Every day | ORAL | Status: DC
  Administered 2014-01-20 – 2014-01-21 (×2): 500 mg via ORAL
  Filled 2014-01-20 (×3): qty 1

## 2014-01-20 MED ORDER — DIVALPROEX SODIUM ER 500 MG PO TB24
500.0000 mg | ORAL_TABLET | Freq: Every day | ORAL | Status: DC
  Administered 2014-01-22: 500 mg via ORAL
  Filled 2014-01-20 (×2): qty 1

## 2014-01-20 MED ORDER — CETYLPYRIDINIUM CHLORIDE 0.05 % MT LIQD
7.0000 mL | Freq: Two times a day (BID) | OROMUCOSAL | Status: DC
  Administered 2014-01-20 – 2014-01-22 (×5): 7 mL via OROMUCOSAL

## 2014-01-20 MED ORDER — ZIPRASIDONE HCL 60 MG PO CAPS
60.0000 mg | ORAL_CAPSULE | Freq: Two times a day (BID) | ORAL | Status: DC
  Administered 2014-01-20 – 2014-01-22 (×4): 60 mg via ORAL
  Filled 2014-01-20 (×9): qty 1

## 2014-01-20 NOTE — Progress Notes (Signed)
NUTRITION FOLLOW UP  Intervention:    Continue Dysphagia 3 diet with thin liquids to provide nutrition needs.  Nutrition Dx:   Inadequate oral intake related to inability to eat as evidenced by NPO status. Resolved.  Goal:   Intake to meet >90% of estimated nutrition needs. Met.  Monitor:   PO intake, labs, weight trend.  Assessment:   53 yo with history of seizures and psych history presented to ED 9/9 after being found down at home. She was unresponsive for EMS and intubated in ED. In ED CXR shows opacification of entire R airspace.   Patient was extubated on 9/13. Diet has been advanced to dysphagia 3 with thin liquids. Per RN, patient is eating ~75% of meals. Intake is adequate to meet nutrition needs.  Height: Ht Readings from Last 1 Encounters:  01/15/14 _0  (1.626 m)    Weight Status:   Wt Readings from Last 1 Encounters:  01/20/14 192 lb 7.4 oz (87.3 kg)  01/16/14  180 lb 12.4 oz (82 kg)   Re-estimated needs:  Kcal: 1600-1800 Protein: 80-95 gm Fluid: 1.6-1.8 L  Skin: no issues  Diet Order: Dysphagia 3 with thin liquids   Intake/Output Summary (Last 24 hours) at 01/20/14 1532 Last data filed at 01/20/14 1514  Gross per 24 hour  Intake   3065 ml  Output   3400 ml  Net   -335 ml    Last BM: 9/14   Labs:   Recent Labs Lab 01/15/14 1957  01/18/14 0410 01/19/14 0555 01/20/14 0345  NA 142  < > 151* 146 142  K 4.7  < > 4.5 4.2 4.1  CL 108  < > 112 110 107  CO2 18*  < > _1 BUN 53*  < > 84* 79* 64*  CREATININE 4.89*  < > 4.81* 3.85* 3.30*  CALCIUM 9.8  < > 10.1 9.4 9.6  MG 2.9*  --   --   --   --   PHOS 3.9  --  5.2*  --   --   GLUCOSE 99  < > 101* 126* 79  < > = values in this interval not displayed.  CBG (last 3)   Recent Labs  01/19/14 2333 01/20/14 0404 01/20/14 0830  GLUCAP 96 87 97    Scheduled Meds: . amLODipine  10 mg Oral Daily  . antiseptic oral rinse  7 mL Mouth Rinse BID  . darbepoetin (ARANESP) injection -  NON-DIALYSIS  100 mcg Subcutaneous Q Thu-1800  . divalproex  500 mg Oral Daily  . divalproex  500 mg Oral QHS  . ipratropium-albuterol  3 mL Nebulization Q6H  . piperacillin-tazobactam (ZOSYN)  IV  2.25 g Intravenous Q6H  . [START ON 01/21/2014] vancomycin  1,000 mg Intravenous Q48H  . ziprasidone  60 mg Oral BID WC    Continuous Infusions: . sodium chloride 10 mL/hr at 01/16/14 [place], RD, LDN, [place] Pager [phone] After Hours Pager [phone]

## 2014-01-20 NOTE — Evaluation (Signed)
Physical Therapy Evaluation Patient Details Name: Jennifer Lawson MRN: [id number] DOB: Jan 27, 1961 Today's Date: 01/20/2014   History of Present Illness  Pt adm after found unresponsive at home and intubated. PMH - bipolar, HTN, rt TKA  Clinical Impression  Pt admitted with above. Pt currently with functional limitations due to the deficits listed below (see PT Problem List).  Pt will benefit from skilled PT to increase their independence and safety with mobility to allow discharge to home with intermittent support.      Follow Up Recommendations Home health PT;Supervision - Intermittent    Equipment Recommendations  None recommended by PT    Recommendations for Other Services       Precautions / Restrictions Precautions Precautions: Fall Restrictions Weight Bearing Restrictions: No      Mobility  Bed Mobility Overal bed mobility: Modified Independent                Transfers Overall transfer level: Needs assistance Equipment used: Rolling walker (2 wheeled) Transfers: Sit to/from Stand Sit to Stand: Supervision         General transfer comment: Incr time  Ambulation/Gait Ambulation/Gait assistance: Min assist Ambulation Distance (Feet): 100 Feet Assistive device: Rolling walker (2 wheeled) Gait Pattern/deviations: Step-through pattern;Decreased step length - right;Decreased step length - left;Shuffle;Trunk flexed Gait velocity: decr Gait velocity interpretation: Below normal speed for age/gender General Gait Details: Verbal cues to stand more erect.  Stairs            Wheelchair Mobility    Modified Rankin (Stroke Patients Only)       Balance Overall balance assessment: Needs assistance Sitting-balance support: No upper extremity supported;Feet supported Sitting balance-Leahy Scale: Good     Standing balance support: Bilateral upper extremity supported Standing balance-Leahy Scale: Poor Standing balance comment: support of walker to  stand.                             Pertinent Vitals/Pain Pain Assessment: Faces Faces Pain Scale: Hurts a little bit Pain Location: rt ankle Pain Descriptors / Indicators: Aching Pain Intervention(s): Limited activity within patient's tolerance;Repositioned    Home Living Family/patient expects to be discharged to:: Private residence Living Arrangements: Children Available Help at Discharge: Family;Available PRN/intermittently Type of Home: House Home Access: Stairs to enter   Entrance Stairs-Number of Steps: 1 Home Layout: One level Home Equipment: Walker - 2 wheels      Prior Function Level of Independence: Independent with assistive device(s);Needs assistance   Gait / Transfers Assistance Needed: Amb independently and uses a walker at times.  ADL's / Homemaking Assistance Needed: dgtr does housework and assist with meds, pt does her bathing and dressing on her own        Hand Dominance        Extremity/Trunk Assessment   Upper Extremity Assessment: Generalized weakness           Lower Extremity Assessment: Generalized weakness         Communication   Communication: No difficulties  Cognition Arousal/Alertness: Awake/alert Behavior During Therapy: WFL for tasks assessed/performed Overall Cognitive Status: No family/caregiver present to determine baseline cognitive functioning                 General Comments: Pt perseverating on asking to have her foley taken out and to go home.    General Comments      Exercises        Assessment/Plan    PT  Assessment Patient needs continued PT services  PT Diagnosis Difficulty walking;Generalized weakness   PT Problem List Decreased strength;Decreased mobility;Decreased activity tolerance;Decreased balance;Decreased knowledge of use of DME;Decreased knowledge of precautions  PT Treatment Interventions DME instruction;Gait training;Functional mobility training;Therapeutic  activities;Therapeutic exercise;Balance training;Patient/family education   PT Goals (Current goals can be found in the Care Plan section) Acute Rehab PT Goals Patient Stated Goal: Go home PT Goal Formulation: With patient Time For Goal Achievement: 01/27/14 Potential to Achieve Goals: Good    Frequency Min 3X/week   Barriers to discharge        Co-evaluation               End of Session Equipment Utilized During Treatment: Gait belt Activity Tolerance: Patient tolerated treatment well Patient left: in chair;with call bell/phone within reach Nurse Communication: Mobility status         Time: 6579-0383 PT Time Calculation (min): 48 min   Charges:   PT Evaluation $Initial PT Evaluation Tier I: 1 Procedure PT Treatments $Gait Training: 38-52 mins   PT G Codes:          Douglas Smolinsky 2014-02-19, 11:59 AM  [employer] PT [phone]

## 2014-01-20 NOTE — Clinical Documentation Improvement (Signed)
Presents with Altered Mental Status, source of infection, organ failure.  After study, please clarify the likely etiology of the patients AMS:  Gabapentin toxicity Aspiration pneumonia Sepsis - WBC = 22.7 on admit, tachycardic; has source of infection with organ failure Other condition  Thank You, Zoila Shutter ,RN Clinical Documentation Specialist:  [place] [employer] [facility]

## 2014-01-20 NOTE — Progress Notes (Signed)
Subjective:   Extubated Awake Asking when she can go home and wondering why she is getting her depakote in the morning because she usually takes it at night Also complains of thirst  Objective Vital signs in last 24 hours: Filed Vitals:   01/20/14 0600 01/20/14 0700 01/20/14 0800 01/20/14 0824  BP: 136/50 156/78 134/45   Pulse: 83 84 86   Temp: 98.1 F (36.7 C) 98.1 F (36.7 C) 97.8 F (36.6 C)   TempSrc: Core (Comment)  Core (Comment)   Resp: 28 22 26    Height:      Weight:      SpO2: 97% 92% 95% 96%   Weight change: 1.2 kg (2 lb 10.3 oz)  Intake/Output Summary (Last 24 hours) at 01/20/14 1052 Last data filed at 01/20/14 0800  Gross per 24 hour  Intake   3925 ml  Output   3475 ml  Net    450 ml    Physical Exam: General: Awake, alert, oriented to person, date, place Heart: tachy S1S2 No S3 Lungs: Clear anteriorly Abdomen: soft, non tender, non distended Extremities: no significant peripheral edema Dialysis Access: right upper arm AVF- good thrill and bruit   Labs: Basic Metabolic Panel:  Recent Labs Lab 01/15/14 1957  01/18/14 0410 01/19/14 0555 01/20/14 0345  NA 142  < > 151* 146 142  K 4.7  < > 4.5 4.2 4.1  CL 108  < > 112 110 107  CO2 18*  < > 25 24 21   GLUCOSE 99  < > 101* 126* 79  BUN 53*  < > 84* 79* 64*  CREATININE 4.89*  < > 4.81* 3.85* 3.30*  CALCIUM 9.8  < > 10.1 9.4 9.6  PHOS 3.9  --  5.2*  --   --   < > = values in this interval not displayed. Liver Function Tests:  Recent Labs Lab 01/15/14 1957 01/17/14 0500 01/18/14 0410 01/19/14 0555  AST 41* 19  --  19  ALT 16 12  --  15  ALKPHOS 67 56  --  88  BILITOT <0.2* <0.2*  --  <0.2*  PROT 7.6 5.4*  --  5.6*  ALBUMIN 2.9* 1.9* 2.4* 1.9*   Recent Labs Lab 01/15/14 1407  01/16/14 0450 01/17/14 0500 01/18/14 0410 01/19/14 0555 01/20/14 0345  WBC 6.6  --  22.7* 18.9* 20.2* 11.8* 9.4  NEUTROABS 6.0  --   --   --   --  10.1* 6.6  HGB 10.9*  < > 9.6* 7.3* 8.2* 7.1* 7.3*  HCT 35.0*   < > 30.5* 22.2* 26.1* 22.4* 23.7*  MCV 99.2  --  95.0 93.7 94.2 98.2 96.0  PLT 320  --  302 203 231 174 181  < > = values in this interval not displayed.  Recent Labs Lab 01/19/14 1554 01/19/14 1927 01/19/14 2333 01/20/14 0404 01/20/14 0830  GLUCAP 109* 141* 96 87 97   Studies/Results: Dg Chest Port 1 View  01/20/2014   CLINICAL DATA:  Check endotracheal tube placement. The patient has been extubated.  EXAM: PORTABLE CHEST - 1 VIEW  COMPARISON:  January 19, 2014  FINDINGS: The heart size and mediastinal contours are stable. The heart size is enlarged. The previously noted endotracheal tube, nasogastric tube, right jugular central venous line have been removed. There is right central pulmonary vascular congestion. Atelectasis of both lung bases are noted. There is probable minimal right pleural effusion. The visualized skeletal structures are stable.  IMPRESSION: Previously noted endotracheal tube  has been removed. There is stable right central pulmonary vascular congestion. There are atelectasis of both lung bases with probable minimal right pleural effusion.   Electronically Signed   By: Abelardo Diesel M.D.   On: 01/20/2014 07:33   Dg Chest Port 1 View  01/19/2014   CLINICAL DATA:  Evaluate endotracheal tube placement.  EXAM: PORTABLE CHEST - 1 VIEW  COMPARISON:  Chest x-ray 01/17/2014.  FINDINGS: An endotracheal tube is in place with tip 4.4 cm above the carina. There is a right-sided internal jugular central venous catheter with tip terminating in the mid superior vena cava. A nasogastric tube is seen extending into the stomach, however, the tip of the nasogastric tube extends below the lower margin of the image. Lung volumes are low. Extensive bibasilar opacities may reflect areas of atelectasis and/or consolidation. Slight improvement in aeration in the right middle lobe compared to the prior examination. Small bilateral pleural effusions decreased. No evidence of pulmonary edema. Mild  cardiomegaly. Upper mediastinal contours are within normal limits allowing for low lung volumes and patient positioning.  IMPRESSION: 1. Support apparatus, as above. 2. Low lung volumes with extensive bibasilar atelectasis and/or consolidation. However, there is slight improved aeration in the right middle lobe, and small bilateral pleural effusions appear decreased compared to 01/17/2014 examination. 3. Mild cardiomegaly.   Electronically Signed   By: Vinnie Langton M.D.   On: 01/19/2014 08:35   Medications: Infusions: . sodium chloride 10 mL/hr at 01/16/14 1800  . dextrose 50 mL/hr at 01/19/14 1321    Scheduled Medications: . antiseptic oral rinse  7 mL Mouth Rinse BID  . darbepoetin (ARANESP) injection - NON-DIALYSIS  100 mcg Subcutaneous Q Thu-1800  . divalproex  500 mg Oral Daily  . famotidine  20 mg Per Tube QHS  . ferric gluconate (FERRLECIT/NULECIT) IV  125 mg Intravenous Daily  . free water  400 mL Per Tube 3 times per day  . ipratropium-albuterol  3 mL Nebulization Q6H  . piperacillin-tazobactam (ZOSYN)  IV  2.25 g Intravenous Q6H  . [START ON 01/21/2014] vancomycin  1,000 mg Intravenous Q48H     Assessment: 53 year old black female with multiple medical issues including stage IV CKD. She presented acutely unresponsive with respiratory failure requiring intubation, AKI on CKD. Now extubated, awake and alert, with stable renal function at/near her outpt baseline.  Renal- known stage IV CKD at baseline with creatinine around 3.5 09/2013. Creatinine peaked at 5.6 and is now at/sl better than baseline.  She is nonoliguric There are no  indications for dialysis.  Has AVF in place.  Hypertension/volume - Only volume getting now is D5W at 50 + po fluids Pulmonary - opacification of her right-sided airspace. Clearing on xray. Had fever and bacteremia (1 of 2 BC GPC clusters) . Started on antibiotics per CCM- WBC continues to improve as does CXR.  Altered mental status- appears to be of  acute onset. Not consistent with uremia. History of at least 600 mg of Neurontin and history of Neurontin toxicity this could be the culprit. Also just altered MS based on sickness- has improved - I don't know her baseline but she is pretty well oriented right now Hyperkalemia- resolved Acidosis - both resp and metabolic- improved- bicarb stopped. No ABG today but venous CO2 OK. Anemia- hgb has decreased with events- given aranesp and iron stores low- are repleting Hypernatremia- free water and D5 - is improved. Awake and thirsty - allow free access to water.   Jamal Maes, MD [facility] [phone] Pager 01/20/2014, 11:06 AM

## 2014-01-20 NOTE — Progress Notes (Signed)
PULMONARY / CRITICAL CARE MEDICINE   Name: Jennifer Lawson MRN: [id number] DOB: 11-25-1960    ADMISSION DATE:  01/15/2014 CONSULTATION DATE:  01/15/2014  REFERRING MD :  EDP  CHIEF COMPLAINT:  AMS  INITIAL PRESENTATION: 53 yo with history of CKD, seizures and psych history presented to ED 9/9 after being found down at home - suspected. Last seen normal 9/8 PM. Family states she was She was unresponsive for EMS and intubated in ED. In ED CXR shows opacification of entire R airspace - suspected aspiration. PCCM asked to see for ICU admission.    SIGNIFICANT EVENTS/STUDIES:  9/09 intubated, admitted to ICU.  9/09 CT head: no acute intracranial abnormality  9/10 EEG: This EEG is consistent with diffuse non-specific cerebral dysfunction, and has features consistent with anesthetic sedation. No electrographic seizures were seen. 9/10 MRI brain: normal 9/11 low grade fever. Failed SBT  LINES/TUBES: ETT 9/09 >> 9/13  R IJ CVL 9/09 >>>9/13  MICRO: Urine 9/09 >> NEG Resp  9/10 >> NOF Blood 9/09 >> 1/2 GPC clusters >>   ABX: Amp-sulbactam 9/09 >>9/12 Zosyn 9/12 >>  Vanc 9/09 >> 9/11, 9/13 >>    SUBJECTIVE:  Baseline cognition. NAD  VITAL SIGNS: Temp:  [97.8 F (36.6 C)-99.1 F (37.3 C)] 98.6 F (37 C) (09/14 1316) Pulse Rate:  [83-95] 86 (09/14 0800) Resp:  [18-34] 26 (09/14 0800) BP: (103-177)/(45-89) 134/45 mmHg (09/14 0800) SpO2:  [91 %-100 %] 96 % (09/14 1519) Weight:  [87.3 kg (192 lb 7.4 oz)] 87.3 kg (192 lb 7.4 oz) (09/14 0300) HEMODYNAMICS:   VENTILATOR SETTINGS:   INTAKE / OUTPUT:  Intake/Output Summary (Last 24 hours) at 01/20/14 1533 Last data filed at 01/20/14 1514  Gross per 24 hour  Intake   3065 ml  Output   3400 ml  Net   -335 ml    PHYSICAL EXAMINATION: General:  NAD Neuro:  No focal deficits.  HEENT: WNL Cardiovascular: RRR s M Lungs: clear Abdomen: soft, NT, +BS Ext: no edema, AV fistula RUE   LABS: I have reviewed all of today's lab  results. Relevant abnormalities are discussed in the A/P section  CXR: Stable right central pulmonary vascular congestion. There are atelectasis of both lung bases with probable minimal right pleural effusion.   ASSESSMENT / PLAN:  PULMONARY A:  Acute respiratory failure, resolved Aspiration PNA, NOS COPD, smoker P:   O2 as needed Cont scheduled and PRN nebs  CARDIOVASCULAR A:  Tachycardia, resolved H/o HTN Chronic beta blocker use P:  PRN metoprolol to maintain HR < 115/min Holding home meds  RENAL A:   AKI, resolved CRI P:   Renal service following. Monitor BMET intermittently Monitor I/Os Correct electrolytes as indicated DC D5W 9/14  GASTROINTESTINAL A:   GERD, not on chronic therapy P:   SUP: N/I post extubation Advance diet as tolerated  HEMATOLOGIC A:   Anemia due to renal failure P:  DVT px: SCDs Monitor CBC intermittently Transfuse per usual ICU guidelines   INFECTIOUS A:   Aspiration PNA 1/2 positive BC - likely contaminant P:   Micro and abx as above IF GPC clusters are coag neg, would DC vanc and follow off abx  ENDOCRINE A:   DM II, controlled P:   Monitoring off SSI Resume coverage if glu > 180 on chem panels  NEUROLOGIC A:  Acute encephalopathy, resolved Seizure D/O Suspected seizure on admission - likely triggered by gabapentin Subtherapeutic VPA level on admission H/o depression, PTSD, psychosis H/o OD P:  RASS goal: 0 NO FURTHER GABAPENTIN Resume Geodon 9/14 Cont VPA per Neuro  TODAY'S SUMMARY:  Transfer to med-surg. TRH to assume in AM 9/15 and PCCM to sign off. Discussed with Mardene Celeste, MD ; [facility] [phone].  After 5:30 PM or weekends, call [phone]  '

## 2014-01-21 DIAGNOSIS — N179 Acute kidney failure, unspecified: Secondary | ICD-10-CM

## 2014-01-21 LAB — RENAL FUNCTION PANEL
Albumin: 2.7 g/dL — ABNORMAL LOW (ref 3.5–5.2)
Anion gap: 19 — ABNORMAL HIGH (ref 5–15)
BUN: 54 mg/dL — ABNORMAL HIGH (ref 6–23)
CO2: 18 mEq/L — ABNORMAL LOW (ref 19–32)
Calcium: 11.2 mg/dL — ABNORMAL HIGH (ref 8.4–10.5)
Chloride: 109 mEq/L (ref 96–112)
Creatinine, Ser: 3.31 mg/dL — ABNORMAL HIGH (ref 0.50–1.10)
GFR calc Af Amer: 17 mL/min — ABNORMAL LOW (ref >90)
GFR calc non Af Amer: 15 mL/min — ABNORMAL LOW (ref >90)
Glucose, Bld: 79 mg/dL (ref 70–99)
Phosphorus: 3.8 mg/dL (ref 2.3–4.6)
Potassium: 4.7 mEq/L (ref 3.7–5.3)
Sodium: 146 mEq/L (ref 137–147)

## 2014-01-21 LAB — CULTURE, BLOOD (ROUTINE X 2): Culture: NO GROWTH

## 2014-01-21 MED ORDER — IPRATROPIUM-ALBUTEROL 0.5-2.5 (3) MG/3ML IN SOLN
3.0000 mL | Freq: Once | RESPIRATORY_TRACT | Status: AC
  Administered 2014-01-21: 3 mL via RESPIRATORY_TRACT
  Filled 2014-01-21: qty 3

## 2014-01-21 MED ORDER — ALBUTEROL SULFATE (2.5 MG/3ML) 0.083% IN NEBU
2.0000 mL | INHALATION_SOLUTION | RESPIRATORY_TRACT | Status: DC | PRN
  Administered 2014-01-22: 3 mL via RESPIRATORY_TRACT
  Filled 2014-01-21: qty 3

## 2014-01-21 MED ORDER — AMOXICILLIN-POT CLAVULANATE 500-125 MG PO TABS
1.0000 | ORAL_TABLET | Freq: Every day | ORAL | Status: DC
  Administered 2014-01-21 – 2014-01-22 (×2): 500 mg via ORAL
  Filled 2014-01-21 (×3): qty 1

## 2014-01-21 MED ORDER — BUDESONIDE-FORMOTEROL FUMARATE 160-4.5 MCG/ACT IN AERO
2.0000 | INHALATION_SPRAY | Freq: Two times a day (BID) | RESPIRATORY_TRACT | Status: DC
  Administered 2014-01-21 – 2014-01-22 (×2): 2 via RESPIRATORY_TRACT
  Filled 2014-01-21: qty 6

## 2014-01-21 MED ORDER — IPRATROPIUM-ALBUTEROL 0.5-2.5 (3) MG/3ML IN SOLN
3.0000 mL | Freq: Two times a day (BID) | RESPIRATORY_TRACT | Status: DC
  Administered 2014-01-22: 3 mL via RESPIRATORY_TRACT
  Filled 2014-01-21: qty 3

## 2014-01-21 MED ORDER — ENOXAPARIN SODIUM 40 MG/0.4ML ~~LOC~~ SOLN
40.0000 mg | SUBCUTANEOUS | Status: DC
  Administered 2014-01-21: 40 mg via SUBCUTANEOUS
  Filled 2014-01-21 (×2): qty 0.4

## 2014-01-21 MED ORDER — ALBUTEROL SULFATE (2.5 MG/3ML) 0.083% IN NEBU: 2.5000 mg | INHALATION_SOLUTION | RESPIRATORY_TRACT | Status: DC | PRN

## 2014-01-21 NOTE — Progress Notes (Signed)
Subjective:   Wants to go home Mentally awake, alert and I presume at baseline Good UOP Kidney function at baseline   Objective Vital signs in last 24 hours: Filed Vitals:   01/21/14 0518 01/21/14 0612 01/21/14 0744 01/21/14 0900  BP: 133/67     Pulse: 90 88    Temp: 98.3 F (36.8 C)     TempSrc: Oral     Resp: 18 24    Height:      Weight:      SpO2: 97%  95% 94%   Weight change:   Intake/Output Summary (Last 24 hours) at 01/21/14 1207 Last data filed at 01/21/14 0900  Gross per 24 hour  Intake    650 ml  Output   1375 ml  Net   -725 ml    Physical Exam: General: Awake, alert, oriented to person, date, place Heart: tachy S1S2 No S3 Lungs: Clear anteriorly Abdomen: soft, non tender, non distended Extremities: No edema Dialysis Access: right upper arm AVF- good thrill and bruit   Labs: Basic Metabolic Panel:  Recent Labs Lab 01/15/14 1957  01/18/14 0410 01/19/14 0555 01/20/14 0345 01/21/14 0720  NA 142  < > 151* 146 142 146  K 4.7  < > 4.5 4.2 4.1 4.7  CL 108  < > 112 110 107 109  CO2 18*  < > 25 24 21  18*  GLUCOSE 99  < > 101* 126* 79 79  BUN 53*  < > 84* 79* 64* 54*  CREATININE 4.89*  < > 4.81* 3.85* 3.30* 3.31*  CALCIUM 9.8  < > 10.1 9.4 9.6 11.2*  PHOS 3.9  --  5.2*  --   --  3.8  < > = values in this interval not displayed.   Recent Labs Lab 01/15/14 1957 01/17/14 0500 01/18/14 0410 01/19/14 0555 01/21/14 0720  AST 41* 19  --  19  --   ALT 16 12  --  15  --   ALKPHOS 67 56  --  88  --   BILITOT <0.2* <0.2*  --  <0.2*  --   PROT 7.6 5.4*  --  5.6*  --   ALBUMIN 2.9* 1.9* 2.4* 1.9* 2.7*   Recent Labs Lab 01/15/14 1407  01/16/14 0450 01/17/14 0500 01/18/14 0410 01/19/14 0555 01/20/14 0345  WBC 6.6  --  22.7* 18.9* 20.2* 11.8* 9.4  NEUTROABS 6.0  --   --   --   --  10.1* 6.6  HGB 10.9*  < > 9.6* 7.3* 8.2* 7.1* 7.3*  HCT 35.0*  < > 30.5* 22.2* 26.1* 22.4* 23.7*  MCV 99.2  --  95.0 93.7 94.2 98.2 96.0  PLT 320  --  302 203 231 174  181  < > = values in this interval not displayed.  Recent Labs Lab 01/19/14 1927 01/19/14 2333 01/20/14 0404 01/20/14 0830 01/20/14 1556  GLUCAP 141* 96 87 97 87   Studies/Results: Dg Chest Port 1 View  01/20/2014   CLINICAL DATA:  Check endotracheal tube placement. The patient has been extubated.  EXAM: PORTABLE CHEST - 1 VIEW  COMPARISON:  January 19, 2014  FINDINGS: The heart size and mediastinal contours are stable. The heart size is enlarged. The previously noted endotracheal tube, nasogastric tube, right jugular central venous line have been removed. There is right central pulmonary vascular congestion. Atelectasis of both lung bases are noted. There is probable minimal right pleural effusion. The visualized skeletal structures are stable.  IMPRESSION: Previously noted endotracheal  tube has been removed. There is stable right central pulmonary vascular congestion. There are atelectasis of both lung bases with probable minimal right pleural effusion.   Electronically Signed   By: Abelardo Diesel M.D.   On: 01/20/2014 07:33   Medications: Infusions: . sodium chloride 10 mL/hr at 01/16/14 1800    Scheduled Medications: . amLODipine  10 mg Oral Daily  . amoxicillin-clavulanate  1 tablet Oral Daily  . antiseptic oral rinse  7 mL Mouth Rinse BID  . darbepoetin (ARANESP) injection - NON-DIALYSIS  100 mcg Subcutaneous Q Thu-1800  . divalproex  500 mg Oral QHS  . divalproex  500 mg Oral Daily  . ipratropium-albuterol  3 mL Nebulization Q6H  . ziprasidone  60 mg Oral BID WC     Background 53 year old black female with multiple medical issues including stage IV CKD. She presented acutely unresponsive with respiratory failure requiring intubation, AKI on CKD. Extubated, awake and alert, with stable renal function at/near her outpt baseline. (Primary nephrologist is Dr. Erling Cruz)  Renal- known stage IV CKD at baseline with creatinine around 3.5 09/2013. Creatinine peaked at 5.6 and is  now at/sl better than baseline.  She remains nonoliguric There has had no indications for dialysis.  Has AVF in place.  She will need to followup with Dr. Florene Glen after discharge. Hypertension/volume - Stable Pulmonary - opacification of her right-sided airspace. Clearing on xray. Had fever and bacteremia (1 of 2 BC GPC clusters) . Antibiotics per primary service.              Altered mental status- appeared to be of acute onset. Not consistent with uremia. History of at least 600 mg of Neurontin and history of Neurontin toxicity this could be the culprit. Also just altered MS based on sickness- has improved - I don't know her baseline but she has been oriented past 2 days Hyperkalemia- resolved Acidosis - both resp and metabolic- improved- IV bicarb stopped.  Anemia- hgb has decreased with events- given aranesp and iron stores low- received 2 doses of ferrlecit.  Is supposed to to get  procrit at [facility] 10,000 Q2wks although our office cannot tell when she last got and last Hb was 9.0 from our records Hypernatremia- Allow free access to water and this will correct as she drinks  Since her renal parameters are stable and at her baseline, will sign off at this time.  She will need followup with Dr. Florene Glen after discharge.  Please call if questions arise that we can assist with.    Jamal Maes, MD [facility] [phone] Pager 01/21/2014, 12:07 PM

## 2014-01-21 NOTE — Evaluation (Signed)
Clinical/Bedside Swallow Evaluation Patient Details  Name: Jennifer Lawson MRN: [id number] Date of Birth: [birth year]  Today's Date: 01/21/2014 Time: 7673-4193 SLP Time Calculation (min): 30 min  Past Medical History:  Past Medical History  Diagnosis Date  . Mental disorder   . Depression   . Hypertension   . Overdose   . Tobacco use disorder 11/13/2012  . Complication of anesthesia     difficulty going to sleep  . Chronic kidney disease     06/11/13- not on dialysis  . Shortness of breath     lying down flat  . Diabetes mellitus without complication     denies  . PTSD (post-traumatic stress disorder)   . Asthma   . COPD (chronic obstructive pulmonary disease)   . Heart murmur   . GERD (gastroesophageal reflux disease)   . Seizures     "passsed out"  . History of blood transfusion    Past Surgical History:  Past Surgical History  Procedure Laterality Date  . Right knee replacement      she says it was last year.  . Esophagogastroduodenoscopy Left 11/14/2012    Procedure: ESOPHAGOGASTRODUODENOSCOPY (EGD);  Surgeon: [name], MD;  Location: [facility];  Service: Endoscopy;  Laterality: Left;  . Joint replacement Right     knee  . Parathyroidectomy    . Av fistula placement Right 06/12/2013    Procedure: ARTERIOVENOUS (AV) FISTULA CREATION; RIGHT  BASILIC VEIN TRANSPOSITION with Intraoperative ultrasound;  Surgeon: Mal [name], MD;  Location: [facility];  Service: Vascular;  Laterality: Right;   HPI:  Pt adm after found unresponsive at home and intubated 9/9-9/13. PMH - bipolar, HTN, rt TKA    Assessment / Plan / Recommendation Clinical Impression  Pt presents with delayed, productive coughing after completion of PO intake, with no evidence of PO noted within the material she expectorated. Oropharyngeal swallow appeared to with adequate timing with no evidence of airway compromise during trials. Per NT, no overt difficulty has been noted during previous meals. Given the  above, recommend to continue Dys 3 textures and thin liquids at this time, with SLP f/u for tolerance.    Aspiration Risk  Mild    Diet Recommendation Dysphagia 3 (Mechanical Soft);Thin liquid   Liquid Administration via: Cup;Straw Medication Administration: Whole meds with puree Supervision: Patient able to self feed;Intermittent supervision to cue for compensatory strategies Compensations: Slow rate;Small sips/bites Postural Changes and/or Swallow Maneuvers: Seated upright 90 degrees;Upright 30-60 min after meal    Other  Recommendations Oral Care Recommendations: Oral care BID   Follow Up Recommendations  None    Frequency and Duration min 2x/week  1 week   Pertinent Vitals/Pain n/a    SLP Swallow Goals     Swallow Study Prior Functional Status       General Date of Onset: 01/15/14 HPI: Pt adm after found unresponsive at home and intubated 9/9-9/13. PMH - bipolar, HTN, rt TKA  Type of Study: Bedside swallow evaluation Previous Swallow Assessment: none in chart Diet Prior to this Study: Dysphagia 3 (soft);Thin liquids Temperature Spikes Noted: No Respiratory Status: Room air History of Recent Intubation: Yes Length of Intubations (days): 5 days Date extubated: 01/19/14 Behavior/Cognition: Alert;Cooperative;Pleasant mood;Confused;Requires cueing;Distractible Oral Cavity - Dentition: Edentulous;Dentures, not available (pt does not have glue for dentures, too loose to wear) Self-Feeding Abilities: Able to feed self Patient Positioning: Upright in bed Baseline Vocal Quality: Low vocal intensity    Oral/Motor/Sensory Function Overall Oral Motor/Sensory Function: Appears within functional limits for  tasks assessed   Ice Chips Ice chips: Not tested   Thin Liquid Thin Liquid: Impaired Presentation: Cup;Self Fed;Straw Pharyngeal  Phase Impairments: Cough - Delayed    Nectar Thick Nectar Thick Liquid: Not tested   Honey Thick Honey Thick Liquid: Not tested   Puree  Puree: Impaired Presentation: Self Fed;Spoon Pharyngeal Phase Impairments: Cough - Delayed   Solid   GO    Solid: Impaired Presentation: Self Fed;Spoon Pharyngeal Phase Impairments: Cough - Delayed        Germain Osgood, M.A. CCC-SLP [phone]  Germain Osgood 01/21/2014,2:23 PM

## 2014-01-21 NOTE — Progress Notes (Signed)
UR completed  Tatiana Courter K. Vittoria Noreen, RN, BSN, [place], CCM  01/21/2014 4:06 PM

## 2014-01-21 NOTE — Progress Notes (Signed)
PATIENT DETAILS Name: Jennifer Lawson Age: 53 y.o. Sex: female Date of Birth: [birth year] Admit Date: 01/15/2014 Admitting Physician Raylene Miyamoto, MD JTT:[id number], MD  Subjective: Anxious to go home.  Assessment/Plan: Active Problems: Acute Hypoxic Resp Failure -secondary to aspiration PNA, encephalopathy -Intubated on 9/9, extubated on 9/13. Currently doing well on room air.  Aspiration PNA -Likely contributing to above -Intubated on 9/9, extubated on 9/13. Currently doing well on room air. -reviewed chart-has completed 6 days of antimicrobial therapy with Zosyn/Unasyn. Transition to Augmentin  Acute Encephalopathy -resolved -suspect multifactorial-secondary to Neurontin toxicity, suspected seizure like activity on admission, and aspiration PNA all contributing.CT head/MRI Brain was negative. EEG neg for seizures  Seizure/Myoclonus like activity on admission -likely secondary to Neurontin toxicity.Myoclonic-like movements-resolved -EEG/MRI brain neg  Acute on CKD stage 4 -renal consulted, creatinine now down trending with supportive care, very close to usual baseline.No indications for HD  Anemia -acute on chronic-acute anemia likely secondary to critical illness. Chronic anemia likely secondary to CKD -no indication for PRBC transfusion, will need close monitoring of Hb  Mild Hypernatremia -allow free access to water.  Hx of HTN  -stable -controlled with just Amlodipine, will d/w renal-when to resume Lasix. Metoprolol remains on hold as well.  Gram Positive Bacteremia -1/2 blood cultures on 9/9positive for Micrococcus-case d/w Dr Baxter Flattery over the phone, suspected contamination from skin, advised to stop IV Vancomycin, and repeat blood cultures in a few days on follow up with PCP  Hx of Depression/PTSD -appears stable -c/w Geodon and Depakote  Disposition: Remain inpatient-home later today or in am-await arrival of family members  DVT  Prophylaxis: Start Prophylactic Heparin   Code Status: Full code  Family Communication None at bedside-awaiting arrival of patient's daughter  Procedures: ETT 9/09 >> 9/13  R IJ CVL 9/09 >>>9/13  CONSULTS:  nephrology  Time spent 40 minutes-which includes 50% of the time with face-to-face with patient/ family and coordinating care related to the above assessment and plan.    MEDICATIONS: Scheduled Meds: . amLODipine  10 mg Oral Daily  . antiseptic oral rinse  7 mL Mouth Rinse BID  . darbepoetin (ARANESP) injection - NON-DIALYSIS  100 mcg Subcutaneous Q Thu-1800  . divalproex  500 mg Oral QHS  . divalproex  500 mg Oral Daily  . ipratropium-albuterol  3 mL Nebulization Q6H  . vancomycin  1,000 mg Intravenous Q48H  . ziprasidone  60 mg Oral BID WC   Continuous Infusions: . sodium chloride 10 mL/hr at 01/16/14 1800   PRN Meds:.sodium chloride, acetaminophen (TYLENOL) oral liquid 160 mg/5 mL, albuterol  Antibiotics: Anti-infectives   Start     Dose/Rate Route Frequency Ordered Stop   01/21/14 1800  vancomycin (VANCOCIN) IVPB 1000 mg/200 mL premix     1,000 mg 200 mL/hr over 60 Minutes Intravenous Every 48 hours 01/19/14 1710     01/19/14 1800  vancomycin (VANCOCIN) IVPB 1000 mg/200 mL premix  Status:  Discontinued     1,000 mg 200 mL/hr over 60 Minutes Intravenous Every 48 hours 01/19/14 1705 01/19/14 1710   01/19/14 1800  vancomycin (VANCOCIN) 1,500 mg in sodium chloride 0.9 % 500 mL IVPB     1,500 mg 250 mL/hr over 120 Minutes Intravenous  Once 01/19/14 1710 01/19/14 2144   01/18/14 1900  piperacillin-tazobactam (ZOSYN) IVPB 2.25 g  Status:  Discontinued     2.25 g 100 mL/hr over 30 Minutes Intravenous Every 6 hours 01/18/14 1808 01/20/14 1535  01/18/14 1745  piperacillin-tazobactam (ZOSYN) IVPB 3.375 g  Status:  Discontinued     3.375 g 12.5 mL/hr over 240 Minutes Intravenous 3 times per day 01/18/14 1741 01/18/14 1807   01/17/14 1600  vancomycin (VANCOCIN) IVPB  1000 mg/200 mL premix  Status:  Discontinued     1,000 mg 200 mL/hr over 60 Minutes Intravenous Every 48 hours 01/15/14 1632 01/17/14 1002   01/15/14 2000  Ampicillin-Sulbactam (UNASYN) 3 g in sodium chloride 0.9 % 100 mL IVPB  Status:  Discontinued     3 g 100 mL/hr over 60 Minutes Intravenous Every 24 hours 01/15/14 1629 01/18/14 1808   01/15/14 1530  vancomycin (VANCOCIN) 1,500 mg in sodium chloride 0.9 % 500 mL IVPB     1,500 mg 250 mL/hr over 120 Minutes Intravenous  Once 01/15/14 1529 01/15/14 1815   01/15/14 1515  piperacillin-tazobactam (ZOSYN) IVPB 3.375 g     3.375 g 100 mL/hr over 30 Minutes Intravenous  Once 01/15/14 1512 01/15/14 1625       PHYSICAL EXAM: Vital signs in last 24 hours: Filed Vitals:   01/21/14 0237 01/21/14 0518 01/21/14 0612 01/21/14 0744  BP:  133/67    Pulse: 89 90 88   Temp:  98.3 F (36.8 C)    TempSrc:  Oral    Resp: 18 18 24    Height:      Weight:      SpO2: 93% 97%  95%    Weight change:  Filed Weights   01/18/14 0600 01/19/14 0400 01/20/14 0300  Weight: 84.3 kg (185 lb 13.6 oz) 86.1 kg (189 lb 13.1 oz) 87.3 kg (192 lb 7.4 oz)   Body mass index is 35.19 kg/(m^2).   Gen Exam: Awake and alert with clear speech.   Neck: Supple, No JVD.   Chest: B/L Clear.   CVS: S1 S2 Regular, no murmurs.  Abdomen: soft, BS +, non tender, non distended.  Extremities: no edema, lower extremities warm to touch. Neurologic: Non Focal.   Skin: No Rash.  Wounds: N/A.   Intake/Output from previous day:  Intake/Output Summary (Last 24 hours) at 01/21/14 1105 Last data filed at 01/21/14 0900  Gross per 24 hour  Intake    660 ml  Output   1675 ml  Net  -1015 ml     LAB RESULTS: CBC  Recent Labs Lab 01/15/14 1407  01/16/14 0450 01/17/14 0500 01/18/14 0410 01/19/14 0555 01/20/14 0345  WBC 6.6  --  22.7* 18.9* 20.2* 11.8* 9.4  HGB 10.9*  < > 9.6* 7.3* 8.2* 7.1* 7.3*  HCT 35.0*  < > 30.5* 22.2* 26.1* 22.4* 23.7*  PLT 320  --  302 203 231  174 181  MCV 99.2  --  95.0 93.7 94.2 98.2 96.0  MCH 30.9  --  29.9 30.8 29.6 31.1 29.6  MCHC 31.1  --  31.5 32.9 31.4 31.7 30.8  RDW 15.9*  --  15.8* 16.1* 16.3* 16.6* 16.2*  LYMPHSABS 0.3*  --   --   --   --  0.8 1.6  MONOABS 0.3  --   --   --   --  0.4 0.7  EOSABS 0.0  --   --   --   --  0.4 0.5  BASOSABS 0.0  --   --   --   --  0.1 0.0  < > = values in this interval not displayed.  Chemistries   Recent Labs Lab 01/15/14 1957  01/17/14 0500 01/18/14 0410 01/19/14  7591 01/20/14 0345 01/21/14 0720  NA 142  < > 145 151* 146 142 146  K 4.7  < > 3.6* 4.5 4.2 4.1 4.7  CL 108  < > 109 112 110 107 109  CO2 18*  < > 20 25 24 21  18*  GLUCOSE 99  < > 102* 101* 126* 79 79  BUN 53*  < > 72* 84* 79* 64* 54*  CREATININE 4.89*  < > 5.32* 4.81* 3.85* 3.30* 3.31*  CALCIUM 9.8  < > 9.0 10.1 9.4 9.6 11.2*  MG 2.9*  --   --   --   --   --   --   < > = values in this interval not displayed.  CBG:  Recent Labs Lab 01/19/14 1927 01/19/14 2333 01/20/14 0404 01/20/14 0830 01/20/14 1556  GLUCAP 141* 96 87 97 87    GFR Estimated Creatinine Clearance: 19.9 ml/min (by C-G formula based on Cr of 3.31).  Coagulation profile No results found for this basename: INR, PROTIME,  in the last 168 hours  Cardiac Enzymes No results found for this basename: CK, CKMB, TROPONINI, MYOGLOBIN,  in the last 168 hours  No components found with this basename: POCBNP,  No results found for this basename: DDIMER,  in the last 72 hours No results found for this basename: HGBA1C,  in the last 72 hours  Recent Labs  01/19/14 0555  TRIG 248*   No results found for this basename: TSH, T4TOTAL, FREET3, T3FREE, THYROIDAB,  in the last 72 hours No results found for this basename: VITAMINB12, FOLATE, FERRITIN, TIBC, IRON, RETICCTPCT,  in the last 72 hours No results found for this basename: LIPASE, AMYLASE,  in the last 72 hours  Urine Studies No results found for this basename: UACOL, UAPR, USPG, UPH, UTP,  UGL, UKET, UBIL, UHGB, UNIT, UROB, ULEU, UEPI, UWBC, URBC, UBAC, CAST, CRYS, UCOM, BILUA,  in the last 72 hours  MICROBIOLOGY: Recent Results (from the past 240 hour(s))  CULTURE, BLOOD (ROUTINE X 2)     Status: None   Collection Time    01/15/14  3:01 PM      Result Value Ref Range Status   Specimen Description BLOOD HAND LEFT   Final   Special Requests BOTTLES DRAWN AEROBIC ONLY 5CC   Final   Culture  Setup Time     Final   Value: 01/15/2014 19:24     Performed at [place]     Final   Value: NO GROWTH 5 DAYS     Performed at [facility]   Report Status 01/21/2014 FINAL   Final  CULTURE, BLOOD (ROUTINE X 2)     Status: None   Collection Time    01/15/14  3:08 PM      Result Value Ref Range Status   Specimen Description BLOOD WRIST LEFT   Final   Special Requests BOTTLES DRAWN AEROBIC ONLY 5CC   Final   Culture  Setup Time     Final   Value: 01/15/2014 19:24     Performed at [facility]   Culture     Final   Value: MICROCOCCUS SPECIES     Note: Standardized susceptibility testing for this organism is not available.     Note: Gram Stain Report Called to,Read Back By and Verified With: Delphia Grates RN on 01/19/14 at 05:58 by Rise Mu     Performed at [facility]   Report Status 01/21/2014 FINAL  Final  URINE CULTURE     Status: None   Collection Time    01/15/14  4:00 PM      Result Value Ref Range Status   Specimen Description URINE, RANDOM   Final   Special Requests NONE   Final   Culture  Setup Time     Final   Value: 01/15/2014 20:36     Performed at [place]     Final   Value: NO GROWTH     Performed at [facility]   Culture     Final   Value: NO GROWTH     Performed at [facility]   Report Status 01/16/2014 FINAL   Final  MRSA PCR SCREENING     Status: None   Collection Time    01/15/14  5:57 PM      Result Value Ref Range Status   MRSA by PCR NEGATIVE  NEGATIVE Final    Comment:            The GeneXpert MRSA Assay (FDA     approved for NASAL specimens     only), is one component of a     comprehensive MRSA colonization     surveillance program. It is not     intended to diagnose MRSA     infection nor to guide or     monitor treatment for     MRSA infections.  CULTURE, RESPIRATORY (NON-EXPECTORATED)     Status: None   Collection Time    01/16/14 12:25 PM      Result Value Ref Range Status   Specimen Description TRACHEAL ASPIRATE   Final   Special Requests NONE   Final   Gram Stain     Final   Value: MODERATE WBC PRESENT,BOTH PMN AND MONONUCLEAR     RARE SQUAMOUS EPITHELIAL CELLS PRESENT     NO ORGANISMS SEEN     Performed at [facility]   Culture     Final   Value: Non-Pathogenic Oropharyngeal-type Flora Isolated.     Performed at [facility]   Report Status 01/19/2014 FINAL   Final    RADIOLOGY STUDIES/RESULTS: Ct Head Wo Contrast  01/15/2014   CLINICAL DATA:  Unresponsive  EXAM: CT HEAD WITHOUT CONTRAST  TECHNIQUE: Contiguous axial images were obtained from the base of the skull through the vertex without intravenous contrast.  COMPARISON:  09/21/2013  FINDINGS: Paranasal sinuses are well aerated. A small amount of mucosal thickening is noted within the maxillary antrum. No acute bony abnormality is seen. The ventricles are normal in size and configuration. No findings to suggest acute hemorrhage, acute infarction or space-occupying mass lesion are noted.  IMPRESSION: Sinus changes as described.  No acute abnormality noted.   Electronically Signed   By: Inez Catalina M.D.   On: 01/15/2014 15:26   Mr Brain Wo Contrast  01/16/2014   CLINICAL DATA:  Altered mental status. Unresponsive last night. The patient has been intubated in requires ventilatory support.  EXAM: MRI HEAD WITHOUT CONTRAST  TECHNIQUE: Multiplanar, multiecho pulse sequences of the brain and surrounding structures were obtained without intravenous contrast.   COMPARISON:  CT head without contrast 01/15/2014. MRI brain 01/19/2013.  FINDINGS: The diffusion-weighted images demonstrate no evidence for acute or subacute infarction.  No hemorrhage or mass lesion is present. The ventricles are of normal size. There is no significant white matter disease. No significant extra-axial fluid collection is present.  Flow  is present in the major intracranial arteries. The patient is status post scratch of the globes and orbits are intact.  Mild mucosal thickening and a small polyp is noted posteriorly in the maxillary sinuses bilaterally. There is mild mucosal thickening within the posterior ethmoid air cells as well. The remaining paranasal sinuses are clear. There is some fluid in left mastoid air cells. No obstructing nasopharyngeal lesion is evident.  IMPRESSION: 1. Normal MRI appearance the brain. 2. Mild sinus disease. 3. Minimal fluid in the left mastoid air cells.   Electronically Signed   By: Lawrence Santiago M.D.   On: 01/16/2014 17:15   Dg Chest Port 1 View  01/20/2014   CLINICAL DATA:  Check endotracheal tube placement. The patient has been extubated.  EXAM: PORTABLE CHEST - 1 VIEW  COMPARISON:  January 19, 2014  FINDINGS: The heart size and mediastinal contours are stable. The heart size is enlarged. The previously noted endotracheal tube, nasogastric tube, right jugular central venous line have been removed. There is right central pulmonary vascular congestion. Atelectasis of both lung bases are noted. There is probable minimal right pleural effusion. The visualized skeletal structures are stable.  IMPRESSION: Previously noted endotracheal tube has been removed. There is stable right central pulmonary vascular congestion. There are atelectasis of both lung bases with probable minimal right pleural effusion.   Electronically Signed   By: Abelardo Diesel M.D.   On: 01/20/2014 07:33   Dg Chest Port 1 View  01/19/2014   CLINICAL DATA:  Evaluate endotracheal tube  placement.  EXAM: PORTABLE CHEST - 1 VIEW  COMPARISON:  Chest x-ray 01/17/2014.  FINDINGS: An endotracheal tube is in place with tip 4.4 cm above the carina. There is a right-sided internal jugular central venous catheter with tip terminating in the mid superior vena cava. A nasogastric tube is seen extending into the stomach, however, the tip of the nasogastric tube extends below the lower margin of the image. Lung volumes are low. Extensive bibasilar opacities may reflect areas of atelectasis and/or consolidation. Slight improvement in aeration in the right middle lobe compared to the prior examination. Small bilateral pleural effusions decreased. No evidence of pulmonary edema. Mild cardiomegaly. Upper mediastinal contours are within normal limits allowing for low lung volumes and patient positioning.  IMPRESSION: 1. Support apparatus, as above. 2. Low lung volumes with extensive bibasilar atelectasis and/or consolidation. However, there is slight improved aeration in the right middle lobe, and small bilateral pleural effusions appear decreased compared to 01/17/2014 examination. 3. Mild cardiomegaly.   Electronically Signed   By: Vinnie Langton M.D.   On: 01/19/2014 08:35   Dg Chest Port 1 View  01/17/2014   CLINICAL DATA:  Respiratory failure  EXAM: PORTABLE CHEST - 1 VIEW  COMPARISON:  01/16/2014  FINDINGS: Endotracheal tube is again noted 3.2 cm above the carina. A right jugular line is seen in the mid superior vena cava. A nasogastric catheter is seen in the gastric fundus. Cardiac shadow is stable. Persistent infiltrate is noted in the right perihilar region and right lung base. New retrocardiac infiltrate is noted when compare with the prior study.  IMPRESSION: Increasing infiltrate particularly in the left base.   Electronically Signed   By: Inez Catalina M.D.   On: 01/17/2014 07:12   Dg Chest Port 1 View  01/16/2014   CLINICAL DATA:  Followup infiltrate/ consolidation  EXAM: PORTABLE CHEST - 1  VIEW  COMPARISON:  Prior chest x-ray 01/15/2014  FINDINGS: The endotracheal tube is 3.5  cm above the carina. The tip of the nasogastric tube projects over the gastric bubble. The right IJ central venous catheter overlies the mid SVC. Persistent but slightly improved patchy airspace opacities throughout the right lung. Improving left basilar opacity with residual linear densities likely reflecting atelectasis. Query trace bilateral layering pleural effusions. No pneumothorax or pulmonary edema. No acute osseous abnormality.  IMPRESSION: 1. Slightly improved right lung aeration, particularly in the right base likely reflects interval improvement in atelectasis. The background of patchy airspace opacity throughout the right upper and lower lobes is otherwise unchanged and remains consistent with pneumonia. 2. Improving left basilar atelectasis. 3. Probable trace bilateral pleural effusions. 4. Stable and satisfactory support apparatus as above.   Electronically Signed   By: Jacqulynn Cadet M.D.   On: 01/16/2014 07:42   Dg Chest Port 1 View  01/15/2014   CLINICAL DATA:  Central line placement.  EXAM: PORTABLE CHEST - 1 VIEW  COMPARISON:  01/15/2014  FINDINGS: The endotracheal tube and NG tubes are stable. The right IJ catheter has a somewhat medial course. Some of this could be due to the rotation of the patient to the left. However, recommend correlation with venous return and perhaps blood gas analysis. No pneumothorax. The right lung is much better aerated. There is still significant airspace opacification. Left lower lobe atelectasis is noted.  IMPRESSION: The right IJ catheter has a somewhat medial course. Could not exclude the possibility of arterial placement. Recommend correlation with venous blood return and perhaps blood gas analysis.  Improved right lung aeration.   Electronically Signed   By: Kalman Jewels M.D.   On: 01/15/2014 21:30   Dg Chest Port 1 View  01/15/2014   CLINICAL DATA:  ENDOTRACHEAL  TUBE PLACEMENT.  UNRESPONSIVE.  EXAM: PORTABLE CHEST - 1 VIEW  COMPARISON:  09/1913  FINDINGS: 1440 HRS. Endotracheal tube tip is 4.7 cm above the base of the carina. NG tube tip overlies the mid stomach. Left lung is clear. There is diffuse airspace disease and volume loss in the right lung. Telemetry leads overlie the chest.  IMPRESSION: Collapse/consolidation in the right lung.   Electronically Signed   By: [name] Stanley M.D.   On: 01/15/2014 15:04   Dg Abd Portable 1v  01/17/2014   CLINICAL DATA:  OG tube placement.  EXAM: PORTABLE ABDOMEN - 1 VIEW  COMPARISON:  09/21/2013  FINDINGS: Enteric tube tip is in the left upper quadrant consistent with location in the mid stomach. Atelectasis is suggested in both lung bases. Bowel gas pattern is normal.  IMPRESSION: Enteric tube tip in the left upper quadrant consistent with location in the stomach.   Electronically Signed   By: Lucienne Capers M.D.   On: 01/17/2014 05:37    Oren Binet, MD  [facility] Pager:[phone]  If 7PM-7AM, please contact night-coverage [email or web address] Password [email or web address] 01/21/2014, 11:05 AM   LOS: 6 days   **Disclaimer: This note may have been dictated with voice recognition software. Similar sounding words can inadvertently be transcribed and this note may contain transcription errors which may not have been corrected upon publication of note.**

## 2014-01-21 NOTE — Progress Notes (Signed)
Physical Therapy Treatment Patient Details Name: Jennifer Lawson MRN: [id number] DOB: May 02, 1961 Today's Date: 2014-02-11    History of Present Illness Pt adm after found unresponsive at home and intubated. PMH - bipolar, HTN, rt TKA    PT Comments    Pt making good progress. Should be ready for return home from PT standpoint when medically ready.  Follow Up Recommendations  Home health PT;Supervision - Intermittent     Equipment Recommendations  None recommended by PT    Recommendations for Other Services       Precautions / Restrictions Precautions Precautions: Fall    Mobility  Bed Mobility Overal bed mobility: Modified Independent                Transfers Overall transfer level: Modified independent   Transfers: Sit to/from Stand Sit to Stand: Modified independent (Device/Increase time)            Ambulation/Gait Ambulation/Gait assistance: Supervision Ambulation Distance (Feet): 140 Feet Assistive device: Rolling walker (2 wheeled) Gait Pattern/deviations: Step-through pattern;Decreased stride length;Trunk flexed     General Gait Details: Verbal cues to stand more erect.   Stairs            Wheelchair Mobility    Modified Rankin (Stroke Patients Only)       Balance   Sitting-balance support: No upper extremity supported Sitting balance-Leahy Scale: Good     Standing balance support: No upper extremity supported Standing balance-Leahy Scale: Fair                      Cognition Arousal/Alertness: Awake/alert Behavior During Therapy: WFL for tasks assessed/performed Overall Cognitive Status: Within Functional Limits for tasks assessed                      Exercises      General Comments        Pertinent Vitals/Pain Pain Assessment: No/denies pain    Home Living                      Prior Function            PT Goals (current goals can now be found in the care plan section) Progress  towards PT goals: Goals met and updated - see care plan    Frequency       PT Plan Current plan remains appropriate    Co-evaluation             End of Session Equipment Utilized During Treatment: Gait belt Activity Tolerance: Patient tolerated treatment well Patient left: in bed;with call bell/phone within reach;with bed alarm set     Time: 1525-1539 PT Time Calculation (min): 14 min  Charges:  $Gait Training: 8-22 mins                    G Codes:      Jennifer Lawson Feb 11, 2014, 3:46 PM  [facility] PT [phone]

## 2014-01-22 LAB — BASIC METABOLIC PANEL
Anion gap: 11 (ref 5–15)
BUN: 48 mg/dL — ABNORMAL HIGH (ref 6–23)
CO2: 22 mEq/L (ref 19–32)
Calcium: 10.7 mg/dL — ABNORMAL HIGH (ref 8.4–10.5)
Chloride: 108 mEq/L (ref 96–112)
Creatinine, Ser: 3.23 mg/dL — ABNORMAL HIGH (ref 0.50–1.10)
GFR calc Af Amer: 18 mL/min — ABNORMAL LOW (ref >90)
GFR calc non Af Amer: 15 mL/min — ABNORMAL LOW (ref >90)
Glucose, Bld: 79 mg/dL (ref 70–99)
Potassium: 4.6 mEq/L (ref 3.7–5.3)
Sodium: 141 mEq/L (ref 137–147)

## 2014-01-22 MED ORDER — ZIPRASIDONE HCL 60 MG PO CAPS: 60.0000 mg | ORAL_CAPSULE | Freq: Two times a day (BID) | ORAL | Status: DC

## 2014-01-22 MED ORDER — AMLODIPINE BESYLATE 10 MG PO TABS: 10.0000 mg | ORAL_TABLET | Freq: Every day | ORAL | Status: DC

## 2014-01-22 MED ORDER — AMOXICILLIN-POT CLAVULANATE 500-125 MG PO TABS: 1.0000 | ORAL_TABLET | Freq: Every day | ORAL | Status: DC

## 2014-01-22 MED ORDER — FUROSEMIDE 40 MG PO TABS
120.0000 mg | ORAL_TABLET | Freq: Two times a day (BID) | ORAL | Status: DC
Start: 2014-01-22 — End: 2014-02-12

## 2014-01-22 MED ORDER — DIVALPROEX SODIUM ER 500 MG PO TB24: 500.0000 mg | ORAL_TABLET | Freq: Every day | ORAL | Status: DC

## 2014-01-22 MED ORDER — FLUTICASONE-SALMETEROL 100-50 MCG/DOSE IN AEPB: 1.0000 | INHALATION_SPRAY | Freq: Two times a day (BID) | RESPIRATORY_TRACT | Status: DC

## 2014-01-22 MED ORDER — ALBUTEROL SULFATE (2.5 MG/3ML) 0.083% IN NEBU: 2.5000 mg | INHALATION_SOLUTION | RESPIRATORY_TRACT | Status: DC | PRN

## 2014-01-22 NOTE — Discharge Summary (Signed)
PATIENT DETAILS Name: Jennifer Lawson Age: 53 y.o. Sex: female Date of Birth: [birth year] MRN: [id number]. Admitting Physician: Raylene Miyamoto, MD INO:[id number],SJGGEZ, MD  Admit Date: 01/15/2014 Discharge date: 01/22/2014  Recommendations for Outpatient Follow-up:  1. Please check CBC, and chemistries next visit 2. Please repeat chest x-ray in 6-8 weeks to document clearance of pneumonia 3. Vancomycin discontinued on 9/15-1 set of blood cultures positive for micrococcus (likely a contaminant)-infectious disease recommending repeat blood cultures at followup with PCP. 4. DZ SS at next visit whether metoprolol can be resumed  PRIMARY DISCHARGE DIAGNOSIS:  Active Problems:   Altered mental status   Metabolic acidemia   Acute on chronic kidney disease stage IV   Aspiration pneumonia      PAST MEDICAL HISTORY: Past Medical History  Diagnosis Date  . Mental disorder   . Depression   . Hypertension   . Overdose   . Tobacco use disorder 11/13/2012  . Complication of anesthesia     difficulty going to sleep  . Chronic kidney disease     06/11/13- not on dialysis  . Shortness of breath     lying down flat  . Diabetes mellitus without complication     denies  . PTSD (post-traumatic stress disorder)   . Asthma   . COPD (chronic obstructive pulmonary disease)   . Heart murmur   . GERD (gastroesophageal reflux disease)   . Seizures     "passsed out"  . History of blood transfusion     DISCHARGE MEDICATIONS:   Medication List    STOP taking these medications       aspirin-acetaminophen-caffeine 250-250-65 MG per tablet  Commonly known as:  EXCEDRIN MIGRAINE     doxylamine (Sleep) 25 MG tablet  Commonly known as:  UNISOM     metoprolol succinate 100 MG 24 hr tablet  Commonly known as:  TOPROL-XL      TAKE these medications       albuterol (2.5 MG/3ML) 0.083% nebulizer solution  Commonly known as:  PROVENTIL  Take 3 mLs (2.5 mg total) by nebulization every 4  (four) hours as needed for wheezing or shortness of breath.     amLODipine 10 MG tablet  Commonly known as:  NORVASC  Take 1 tablet (10 mg total) by mouth daily.     amoxicillin-clavulanate 500-125 MG per tablet  Commonly known as:  AUGMENTIN  Take 1 tablet (500 mg total) by mouth daily.     divalproex 500 MG 24 hr tablet  Commonly known as:  DEPAKOTE ER  Take 1 tablet (500 mg total) by mouth at bedtime.     Fluticasone-Salmeterol 100-50 MCG/DOSE Aepb  Commonly known as:  ADVAIR  Inhale 1 puff into the lungs 2 (two) times daily.     furosemide 40 MG tablet  Commonly known as:  LASIX  Take 3 tablets (120 mg total) by mouth 2 (two) times daily.     ziprasidone 60 MG capsule  Commonly known as:  GEODON  Take 1 capsule (60 mg total) by mouth 2 (two) times daily with a meal.        ALLERGIES:   Allergies  Allergen Reactions  . Codeine Sulfate Anaphylaxis    Daughter called about having this allergy   . Haldol [Haloperidol Lactate] Shortness Of Breath  . Risperidone And Related Shortness Of Breath    BRIEF HPI:  See H&P, Labs, Consult and Test reports for all details in brief, 53 yo with history of CKD, seizures  and psych history presented to ED 9/9 after being found down at home,Last seen normal 9/8 PM. Family states she was She was unresponsive for EMS and intubated in ED. In ED CXR shows opacification of entire R airspace - suspected aspiration  CONSULTATIONS:   pulmonary/intensive care, nephrology and neurology  PERTINENT RADIOLOGIC STUDIES: Ct Head Wo Contrast  01/15/2014   CLINICAL DATA:  Unresponsive  EXAM: CT HEAD WITHOUT CONTRAST  TECHNIQUE: Contiguous axial images were obtained from the base of the skull through the vertex without intravenous contrast.  COMPARISON:  09/21/2013  FINDINGS: Paranasal sinuses are well aerated. A small amount of mucosal thickening is noted within the maxillary antrum. No acute bony abnormality is seen. The ventricles are normal in size  and configuration. No findings to suggest acute hemorrhage, acute infarction or space-occupying mass lesion are noted.  IMPRESSION: Sinus changes as described.  No acute abnormality noted.   Electronically Signed   By: Inez Catalina M.D.   On: 01/15/2014 15:26   Mr Brain Wo Contrast  01/16/2014   CLINICAL DATA:  Altered mental status. Unresponsive last night. The patient has been intubated in requires ventilatory support.  EXAM: MRI HEAD WITHOUT CONTRAST  TECHNIQUE: Multiplanar, multiecho pulse sequences of the brain and surrounding structures were obtained without intravenous contrast.  COMPARISON:  CT head without contrast 01/15/2014. MRI brain 01/19/2013.  FINDINGS: The diffusion-weighted images demonstrate no evidence for acute or subacute infarction.  No hemorrhage or mass lesion is present. The ventricles are of normal size. There is no significant white matter disease. No significant extra-axial fluid collection is present.  Flow is present in the major intracranial arteries. The patient is status post scratch of the globes and orbits are intact.  Mild mucosal thickening and a small polyp is noted posteriorly in the maxillary sinuses bilaterally. There is mild mucosal thickening within the posterior ethmoid air cells as well. The remaining paranasal sinuses are clear. There is some fluid in left mastoid air cells. No obstructing nasopharyngeal lesion is evident.  IMPRESSION: 1. Normal MRI appearance the brain. 2. Mild sinus disease. 3. Minimal fluid in the left mastoid air cells.   Electronically Signed   By: Lawrence Santiago M.D.   On: 01/16/2014 17:15   Dg Chest Port 1 View  01/20/2014   CLINICAL DATA:  Check endotracheal tube placement. The patient has been extubated.  EXAM: PORTABLE CHEST - 1 VIEW  COMPARISON:  January 19, 2014  FINDINGS: The heart size and mediastinal contours are stable. The heart size is enlarged. The previously noted endotracheal tube, nasogastric tube, right jugular central  venous line have been removed. There is right central pulmonary vascular congestion. Atelectasis of both lung bases are noted. There is probable minimal right pleural effusion. The visualized skeletal structures are stable.  IMPRESSION: Previously noted endotracheal tube has been removed. There is stable right central pulmonary vascular congestion. There are atelectasis of both lung bases with probable minimal right pleural effusion.   Electronically Signed   By: Abelardo Diesel M.D.   On: 01/20/2014 07:33   Dg Chest Port 1 View  01/19/2014   CLINICAL DATA:  Evaluate endotracheal tube placement.  EXAM: PORTABLE CHEST - 1 VIEW  COMPARISON:  Chest x-ray 01/17/2014.  FINDINGS: An endotracheal tube is in place with tip 4.4 cm above the carina. There is a right-sided internal jugular central venous catheter with tip terminating in the mid superior vena cava. A nasogastric tube is seen extending into the stomach, however, the tip of  the nasogastric tube extends below the lower margin of the image. Lung volumes are low. Extensive bibasilar opacities may reflect areas of atelectasis and/or consolidation. Slight improvement in aeration in the right middle lobe compared to the prior examination. Small bilateral pleural effusions decreased. No evidence of pulmonary edema. Mild cardiomegaly. Upper mediastinal contours are within normal limits allowing for low lung volumes and patient positioning.  IMPRESSION: 1. Support apparatus, as above. 2. Low lung volumes with extensive bibasilar atelectasis and/or consolidation. However, there is slight improved aeration in the right middle lobe, and small bilateral pleural effusions appear decreased compared to 01/17/2014 examination. 3. Mild cardiomegaly.   Electronically Signed   By: Vinnie Langton M.D.   On: 01/19/2014 08:35   Dg Chest Port 1 View  01/17/2014   CLINICAL DATA:  Respiratory failure  EXAM: PORTABLE CHEST - 1 VIEW  COMPARISON:  01/16/2014  FINDINGS: Endotracheal  tube is again noted 3.2 cm above the carina. A right jugular line is seen in the mid superior vena cava. A nasogastric catheter is seen in the gastric fundus. Cardiac shadow is stable. Persistent infiltrate is noted in the right perihilar region and right lung base. New retrocardiac infiltrate is noted when compare with the prior study.  IMPRESSION: Increasing infiltrate particularly in the left base.   Electronically Signed   By: Inez Catalina M.D.   On: 01/17/2014 07:12   Dg Chest Port 1 View  01/16/2014   CLINICAL DATA:  Followup infiltrate/ consolidation  EXAM: PORTABLE CHEST - 1 VIEW  COMPARISON:  Prior chest x-ray 01/15/2014  FINDINGS: The endotracheal tube is 3.5 cm above the carina. The tip of the nasogastric tube projects over the gastric bubble. The right IJ central venous catheter overlies the mid SVC. Persistent but slightly improved patchy airspace opacities throughout the right lung. Improving left basilar opacity with residual linear densities likely reflecting atelectasis. Query trace bilateral layering pleural effusions. No pneumothorax or pulmonary edema. No acute osseous abnormality.  IMPRESSION: 1. Slightly improved right lung aeration, particularly in the right base likely reflects interval improvement in atelectasis. The background of patchy airspace opacity throughout the right upper and lower lobes is otherwise unchanged and remains consistent with pneumonia. 2. Improving left basilar atelectasis. 3. Probable trace bilateral pleural effusions. 4. Stable and satisfactory support apparatus as above.   Electronically Signed   By: Jacqulynn Cadet M.D.   On: 01/16/2014 07:42   Dg Chest Port 1 View  01/15/2014   CLINICAL DATA:  Central line placement.  EXAM: PORTABLE CHEST - 1 VIEW  COMPARISON:  01/15/2014  FINDINGS: The endotracheal tube and NG tubes are stable. The right IJ catheter has a somewhat medial course. Some of this could be due to the rotation of the patient to the left. However,  recommend correlation with venous return and perhaps blood gas analysis. No pneumothorax. The right lung is much better aerated. There is still significant airspace opacification. Left lower lobe atelectasis is noted.  IMPRESSION: The right IJ catheter has a somewhat medial course. Could not exclude the possibility of arterial placement. Recommend correlation with venous blood return and perhaps blood gas analysis.  Improved right lung aeration.   Electronically Signed   By: Kalman Jewels M.D.   On: 01/15/2014 21:30   Dg Chest Port 1 View  01/15/2014   CLINICAL DATA:  ENDOTRACHEAL TUBE PLACEMENT.  UNRESPONSIVE.  EXAM: PORTABLE CHEST - 1 VIEW  COMPARISON:  09/1913  FINDINGS: 1440 HRS. Endotracheal tube tip is 4.7 cm above  the base of the carina. NG tube tip overlies the mid stomach. Left lung is clear. There is diffuse airspace disease and volume loss in the right lung. Telemetry leads overlie the chest.  IMPRESSION: Collapse/consolidation in the right lung.   Electronically Signed   By: [name] Stanley M.D.   On: 01/15/2014 15:04   Dg Abd Portable 1v  01/17/2014   CLINICAL DATA:  OG tube placement.  EXAM: PORTABLE ABDOMEN - 1 VIEW  COMPARISON:  09/21/2013  FINDINGS: Enteric tube tip is in the left upper quadrant consistent with location in the mid stomach. Atelectasis is suggested in both lung bases. Bowel gas pattern is normal.  IMPRESSION: Enteric tube tip in the left upper quadrant consistent with location in the stomach.   Electronically Signed   By: Lucienne Capers M.D.   On: 01/17/2014 05:37     PERTINENT LAB RESULTS: CBC:  Recent Labs  01/20/14 0345  WBC 9.4  HGB 7.3*  HCT 23.7*  PLT 181   CMET CMP     Component Value Date/Time   NA 141 01/22/2014 0539   K 4.6 01/22/2014 0539   CL 108 01/22/2014 0539   CO2 22 01/22/2014 0539   GLUCOSE 79 01/22/2014 0539   BUN 48* 01/22/2014 0539   CREATININE 3.23* 01/22/2014 0539   CALCIUM 10.7* 01/22/2014 0539   PROT 5.6* 01/19/2014 0555   ALBUMIN  2.7* 01/21/2014 0720   AST 19 01/19/2014 0555   ALT 15 01/19/2014 0555   ALKPHOS 88 01/19/2014 0555   BILITOT <0.2* 01/19/2014 0555   GFRNONAA 15* 01/22/2014 0539   GFRAA 18* 01/22/2014 0539    GFR Estimated Creatinine Clearance: 20.4 ml/min (by C-G formula based on Cr of 3.23). No results found for this basename: LIPASE, AMYLASE,  in the last 72 hours No results found for this basename: CKTOTAL, CKMB, CKMBINDEX, TROPONINI,  in the last 72 hours No components found with this basename: POCBNP,  No results found for this basename: DDIMER,  in the last 72 hours No results found for this basename: HGBA1C,  in the last 72 hours No results found for this basename: CHOL, HDL, LDLCALC, TRIG, CHOLHDL, LDLDIRECT,  in the last 72 hours No results found for this basename: TSH, T4TOTAL, FREET3, T3FREE, THYROIDAB,  in the last 72 hours No results found for this basename: VITAMINB12, FOLATE, FERRITIN, TIBC, IRON, RETICCTPCT,  in the last 72 hours Coags: No results found for this basename: PT, INR,  in the last 72 hours Microbiology: Recent Results (from the past 240 hour(s))  CULTURE, BLOOD (ROUTINE X 2)     Status: None   Collection Time    01/15/14  3:01 PM      Result Value Ref Range Status   Specimen Description BLOOD HAND LEFT   Final   Special Requests BOTTLES DRAWN AEROBIC ONLY 5CC   Final   Culture  Setup Time     Final   Value: 01/15/2014 19:24     Performed at [facility]   Culture     Final   Value: NO GROWTH 5 DAYS     Performed at [facility]   Report Status 01/21/2014 FINAL   Final  CULTURE, BLOOD (ROUTINE X 2)     Status: None   Collection Time    01/15/14  3:08 PM      Result Value Ref Range Status   Specimen Description BLOOD WRIST LEFT   Final   Special Requests BOTTLES DRAWN AEROBIC ONLY  [facility]   Final   Culture  Setup Time     Final   Value: 01/15/2014 19:24     Performed at [facility]   Culture     Final   Value: MICROCOCCUS SPECIES     Note:  Standardized susceptibility testing for this organism is not available.     Note: Gram Stain Report Called to,Read Back By and Verified With: Delphia Grates RN on 01/19/14 at 05:58 by Rise Mu     Performed at [facility]   Report Status 01/21/2014 FINAL   Final  URINE CULTURE     Status: None   Collection Time    01/15/14  4:00 PM      Result Value Ref Range Status   Specimen Description URINE, RANDOM   Final   Special Requests NONE   Final   Culture  Setup Time     Final   Value: 01/15/2014 20:36     Performed at [place]     Final   Value: NO GROWTH     Performed at [facility]   Culture     Final   Value: NO GROWTH     Performed at [facility]   Report Status 01/16/2014 FINAL   Final  MRSA PCR SCREENING     Status: None   Collection Time    01/15/14  5:57 PM      Result Value Ref Range Status   MRSA by PCR NEGATIVE  NEGATIVE Final   Comment:            The GeneXpert MRSA Assay (FDA     approved for NASAL specimens     only), is one component of a     comprehensive MRSA colonization     surveillance program. It is not     intended to diagnose MRSA     infection nor to guide or     monitor treatment for     MRSA infections.  CULTURE, RESPIRATORY (NON-EXPECTORATED)     Status: None   Collection Time    01/16/14 12:25 PM      Result Value Ref Range Status   Specimen Description TRACHEAL ASPIRATE   Final   Special Requests NONE   Final   Gram Stain     Final   Value: MODERATE WBC PRESENT,BOTH PMN AND MONONUCLEAR     RARE SQUAMOUS EPITHELIAL CELLS PRESENT     NO ORGANISMS SEEN     Performed at [facility]   Culture     Final   Value: Non-Pathogenic Oropharyngeal-type Flora Isolated.     Performed at [facility]   Report Status 01/19/2014 FINAL   Final     [facility] COURSE:   Active Problems: Acute Hypoxic Resp Failure  -secondary to aspiration PNA, encephalopathy  -Intubated on 9/9,  extubated on 9/13. Managed in the intensive care unit by pulmonary critical care, subsequently transferred to the hospitalist service .Currently doing well on room air.  Aspiration PNA  -Likely contributing to above  -Intubated on 9/9, extubated on 9/13. Currently doing well on room air.  -Reviewed chart-has completed 6 days of antimicrobial therapy with Zosyn/Unasyn,she was transitioned to Augmentin on 9/15,will complete a total of 10 days of antimicrobial therapy. Patient was evaluated by speech therapy, current recommendations for a dysphagia 3 diet, patient/family accepting this recommendation.  Acute Encephalopathy  -resolved  -suspect multifactorial-secondary to Neurontin toxicity, suspected seizure  like activity on admission, and aspiration PNA all contributing.CT head/MRI Brain was negative. EEG neg for seizures  ?Seizure vs Myoclonus like activity on admission  -likely secondary to Neurontin toxicity.Myoclonic-like movements-resolved. Was evaluated by neurology this admission, underwent EEG/MRI brain which was neg. no further recommendations from neurology at this time.  Acute on CKD stage 4  -renal consulted, creatinine now down trending with supportive care, very close to usual baseline.No indications for HD  - Spoke with Dr. Ardyth Harps to resume prior dose of Lasix on discharge.   Anemia  -acute on chronic-acute anemia likely secondary to critical illness. Chronic anemia likely secondary to CKD  -no indication for PRBC transfusion, will need close monitoring of Hb as outpatient  Mild Hypernatremia  - Secondary to dehydration, acute illness. Resolved after allowing free access to water.  Hx of HTN  -stable  -controlled with just Amlodipine- will resume Lasix on discharge. Metoprolol will remain on hold, please reassess at followup if this can be resumed  Gram Positive Bacteremia  -1/2 blood cultures on 9/9positive for Micrococcus-case d/w Dr Baxter Flattery over the phone,  suspected contamination from skin, advised to stop IV Vancomycin, and repeat blood cultures in a few days on follow up with PCP  Hx of Depression/PTSD  -appears stable  -c/w Geodon and Depakote  TODAY-DAY OF DISCHARGE:  Subjective:   Etherine Mackowiak today has no headache,no chest abdominal pain,no new weakness tingling or numbness, feels much better wants to go home today.   Objective:   Blood pressure 132/82, pulse 92, temperature 98.4 F (36.9 C), temperature source Oral, resp. rate 18, height 5\' 2"  (1.575 m), weight 87.3 kg (192 lb 7.4 oz), last menstrual period 05/09/2008, SpO2 98.00%.  Intake/Output Summary (Last 24 hours) at 01/22/14 1151 Last data filed at 01/22/14 1032  Gross per 24 hour  Intake    720 ml  Output   3400 ml  Net  -2680 ml   Filed Weights   01/18/14 0600 01/19/14 0400 01/20/14 0300  Weight: 84.3 kg (185 lb 13.6 oz) 86.1 kg (189 lb 13.1 oz) 87.3 kg (192 lb 7.4 oz)    Exam Awake Alert, Oriented *3, No new F.N deficits, Normal affect [place].AT,PERRAL Supple Neck,No JVD, No cervical lymphadenopathy appriciated.  Symmetrical Chest wall movement, Good air movement bilaterally, CTAB RRR,No Gallops,Rubs or new Murmurs, No Parasternal Heave +ve B.Sounds, Abd Soft, Non tender, No organomegaly appriciated, No rebound -guarding or rigidity. No Cyanosis, Clubbing or edema, No new Rash or bruise  DISCHARGE CONDITION: Stable  DISPOSITION: Home with home health services  DISCHARGE INSTRUCTIONS:    Activity:  As tolerated   Diet recommendation: Heart Healthy diet  Discharge Instructions   Call MD for:  difficulty breathing, headache or visual disturbances    Complete by:  As directed      Diet - low sodium heart healthy    Complete by:  As directed      Discharge instructions    Complete by:  As directed   DO NOT GABAPENTIN/NEURONTIN IN THE FUTURE!     Increase activity slowly    Complete by:  As directed           Follow-up Information   Follow up  with OSEI-BONSU,GEORGE, MD On 01/27/2014. (10 am for follow up)    Specialty:  Internal Medicine   Contact information:   [address] [address] [phone]       Follow up with [facility] On 01/27/2014. (HHPT, HHRN, and  aide)    Contact information:   [place] [address] [phone]      Total Time spent on discharge equals 45 minutes.  SignedOren Binet 01/22/2014 11:51 AM  **Disclaimer: This note may have been dictated with voice recognition software. Similar sounding words can inadvertently be transcribed and this note may contain transcription errors which may not have been corrected upon publication of note.**

## 2014-01-22 NOTE — Progress Notes (Signed)
NURSING PROGRESS NOTE  ARLEEN BAR [id number] Discharge Data: 01/22/2014 12:44 PM Attending Provider: Jonetta Osgood, MD YFV:[id number],FMBWGY, MD     Salomon Mast to be D/C'd Home per MD order.  Discussed with the patient the After Visit Summary and all questions fully answered. All IV's discontinued with no bleeding noted. All belongings returned to patient for patient to take home.   Last Vital Signs:  Blood pressure 132/82, pulse 92, temperature 98.4 F (36.9 C), temperature source Oral, resp. rate 18, height 5\' 2"  (1.575 m), weight 87.3 kg (192 lb 7.4 oz), last menstrual period 05/09/2008, SpO2 98.00%.  Discharge Medication List   Medication List    STOP taking these medications       aspirin-acetaminophen-caffeine 250-250-65 MG per tablet  Commonly known as:  EXCEDRIN MIGRAINE     doxylamine (Sleep) 25 MG tablet  Commonly known as:  UNISOM     metoprolol succinate 100 MG 24 hr tablet  Commonly known as:  TOPROL-XL      TAKE these medications       albuterol (2.5 MG/3ML) 0.083% nebulizer solution  Commonly known as:  PROVENTIL  Take 3 mLs (2.5 mg total) by nebulization every 4 (four) hours as needed for wheezing or shortness of breath.     amLODipine 10 MG tablet  Commonly known as:  NORVASC  Take 1 tablet (10 mg total) by mouth daily.     amoxicillin-clavulanate 500-125 MG per tablet  Commonly known as:  AUGMENTIN  Take 1 tablet (500 mg total) by mouth daily.     divalproex 500 MG 24 hr tablet  Commonly known as:  DEPAKOTE ER  Take 1 tablet (500 mg total) by mouth at bedtime.     Fluticasone-Salmeterol 100-50 MCG/DOSE Aepb  Commonly known as:  ADVAIR  Inhale 1 puff into the lungs 2 (two) times daily.     furosemide 40 MG tablet  Commonly known as:  LASIX  Take 3 tablets (120 mg total) by mouth 2 (two) times daily.     ziprasidone 60 MG capsule  Commonly known as:  GEODON  Take 1 capsule (60 mg total) by mouth 2 (two) times daily with a  meal.         Eri Platten, RN

## 2014-01-22 NOTE — Progress Notes (Signed)
Speech Language Pathology Treatment: Dysphagia  Patient Details Name: Jennifer Lawson MRN: [id number] DOB: 07-12-1960 Today's Date: 01/22/2014 Time: 9906-8934 SLP Time Calculation (min): 9 min  Assessment / Plan / Recommendation Clinical Impression  Dentures ill fitting without cream (at home); declined the Dys 3 texture sausage.  Good oral manipulation and transit with puree (grits) consistency.  No pharyngeal dysphagia following consecutive gulps thin liquid.  SLP educated pt. On increased safety with small sips.  Pt. Discharging home today; recommend use of dentures with a regular texture and thin liquids.  No f/u needed.   HPI HPI: Pt adm after found unresponsive at home and intubated 9/9-9/13. PMH - bipolar, HTN, rt TKA    Pertinent Vitals Pain Assessment: No/denies pain  SLP Plan  All goals met;Discharge SLP treatment due to (comment)    Recommendations Diet recommendations: Dysphagia 3 (mechanical soft);Thin liquid Liquids provided via: Cup Medication Administration: Whole meds with liquid Supervision: Patient able to self feed Compensations: Slow rate;Small sips/bites Postural Changes and/or Swallow Maneuvers: Seated upright 90 degrees;Upright 30-60 min after meal              Oral Care Recommendations: Oral care BID Follow up Recommendations: None Plan: All goals met;Discharge SLP treatment due to (comment)    GO     [name] 01/22/2014, 9:05 AM  Orbie Pyo Colvin Caroli.Ed [employer] [phone]

## 2014-01-24 DIAGNOSIS — N184 Chronic kidney disease, stage 4 (severe): Secondary | ICD-10-CM | POA: Diagnosis not present

## 2014-01-24 DIAGNOSIS — J690 Pneumonitis due to inhalation of food and vomit: Secondary | ICD-10-CM | POA: Diagnosis not present

## 2014-01-24 DIAGNOSIS — F4310 Post-traumatic stress disorder, unspecified: Secondary | ICD-10-CM | POA: Diagnosis not present

## 2014-01-24 DIAGNOSIS — I129 Hypertensive chronic kidney disease with stage 1 through stage 4 chronic kidney disease, or unspecified chronic kidney disease: Secondary | ICD-10-CM | POA: Diagnosis not present

## 2014-01-24 DIAGNOSIS — J449 Chronic obstructive pulmonary disease, unspecified: Secondary | ICD-10-CM | POA: Diagnosis not present

## 2014-01-24 DIAGNOSIS — G40909 Epilepsy, unspecified, not intractable, without status epilepticus: Secondary | ICD-10-CM | POA: Diagnosis not present

## 2014-01-24 DIAGNOSIS — E119 Type 2 diabetes mellitus without complications: Secondary | ICD-10-CM | POA: Diagnosis not present

## 2014-01-28 DIAGNOSIS — G40909 Epilepsy, unspecified, not intractable, without status epilepticus: Secondary | ICD-10-CM | POA: Diagnosis not present

## 2014-01-28 DIAGNOSIS — I129 Hypertensive chronic kidney disease with stage 1 through stage 4 chronic kidney disease, or unspecified chronic kidney disease: Secondary | ICD-10-CM | POA: Diagnosis not present

## 2014-01-28 DIAGNOSIS — J690 Pneumonitis due to inhalation of food and vomit: Secondary | ICD-10-CM | POA: Diagnosis not present

## 2014-01-28 DIAGNOSIS — N184 Chronic kidney disease, stage 4 (severe): Secondary | ICD-10-CM | POA: Diagnosis not present

## 2014-01-28 DIAGNOSIS — E119 Type 2 diabetes mellitus without complications: Secondary | ICD-10-CM | POA: Diagnosis not present

## 2014-01-28 DIAGNOSIS — J449 Chronic obstructive pulmonary disease, unspecified: Secondary | ICD-10-CM | POA: Diagnosis not present

## 2014-01-29 DIAGNOSIS — J449 Chronic obstructive pulmonary disease, unspecified: Secondary | ICD-10-CM | POA: Diagnosis not present

## 2014-01-29 DIAGNOSIS — N184 Chronic kidney disease, stage 4 (severe): Secondary | ICD-10-CM | POA: Diagnosis not present

## 2014-01-29 DIAGNOSIS — I129 Hypertensive chronic kidney disease with stage 1 through stage 4 chronic kidney disease, or unspecified chronic kidney disease: Secondary | ICD-10-CM | POA: Diagnosis not present

## 2014-01-29 DIAGNOSIS — E119 Type 2 diabetes mellitus without complications: Secondary | ICD-10-CM | POA: Diagnosis not present

## 2014-01-29 DIAGNOSIS — J690 Pneumonitis due to inhalation of food and vomit: Secondary | ICD-10-CM | POA: Diagnosis not present

## 2014-01-29 DIAGNOSIS — G40909 Epilepsy, unspecified, not intractable, without status epilepticus: Secondary | ICD-10-CM | POA: Diagnosis not present

## 2014-01-31 DIAGNOSIS — N184 Chronic kidney disease, stage 4 (severe): Secondary | ICD-10-CM | POA: Diagnosis not present

## 2014-01-31 DIAGNOSIS — I129 Hypertensive chronic kidney disease with stage 1 through stage 4 chronic kidney disease, or unspecified chronic kidney disease: Secondary | ICD-10-CM | POA: Diagnosis not present

## 2014-01-31 DIAGNOSIS — J690 Pneumonitis due to inhalation of food and vomit: Secondary | ICD-10-CM | POA: Diagnosis not present

## 2014-01-31 DIAGNOSIS — G40909 Epilepsy, unspecified, not intractable, without status epilepticus: Secondary | ICD-10-CM | POA: Diagnosis not present

## 2014-01-31 DIAGNOSIS — J449 Chronic obstructive pulmonary disease, unspecified: Secondary | ICD-10-CM | POA: Diagnosis not present

## 2014-01-31 DIAGNOSIS — E119 Type 2 diabetes mellitus without complications: Secondary | ICD-10-CM | POA: Diagnosis not present

## 2014-02-03 DIAGNOSIS — J449 Chronic obstructive pulmonary disease, unspecified: Secondary | ICD-10-CM | POA: Diagnosis not present

## 2014-02-03 DIAGNOSIS — J690 Pneumonitis due to inhalation of food and vomit: Secondary | ICD-10-CM | POA: Diagnosis not present

## 2014-02-03 DIAGNOSIS — G40909 Epilepsy, unspecified, not intractable, without status epilepticus: Secondary | ICD-10-CM | POA: Diagnosis not present

## 2014-02-03 DIAGNOSIS — E119 Type 2 diabetes mellitus without complications: Secondary | ICD-10-CM | POA: Diagnosis not present

## 2014-02-03 DIAGNOSIS — I129 Hypertensive chronic kidney disease with stage 1 through stage 4 chronic kidney disease, or unspecified chronic kidney disease: Secondary | ICD-10-CM | POA: Diagnosis not present

## 2014-02-03 DIAGNOSIS — N184 Chronic kidney disease, stage 4 (severe): Secondary | ICD-10-CM | POA: Diagnosis not present

## 2014-02-04 ENCOUNTER — Ambulatory Visit (HOSPITAL_COMMUNITY)
Admission: RE | Admit: 2014-02-04 | Discharge: 2014-02-04 | Disposition: A | Discharge status: Home / Self Care | Payer: Medicare Other | Attending: Psychiatry | Admitting: Psychiatry

## 2014-02-04 ENCOUNTER — Emergency Department (HOSPITAL_COMMUNITY)
Admission: EM | Admit: 2014-02-04 | Discharge: 2014-02-05 | Disposition: A | Discharge status: Other Acute Inpatient Hospital | Payer: Medicare Other | Source: Home / Self Care | Attending: Emergency Medicine | Admitting: Emergency Medicine

## 2014-02-04 ENCOUNTER — Encounter (HOSPITAL_COMMUNITY): Payer: Self-pay | Admitting: Emergency Medicine

## 2014-02-04 DIAGNOSIS — Z792 Long term (current) use of antibiotics: Secondary | ICD-10-CM | POA: Insufficient documentation

## 2014-02-04 DIAGNOSIS — F3289 Other specified depressive episodes: Secondary | ICD-10-CM | POA: Insufficient documentation

## 2014-02-04 DIAGNOSIS — Z833 Family history of diabetes mellitus: Secondary | ICD-10-CM | POA: Diagnosis not present

## 2014-02-04 DIAGNOSIS — Z87828 Personal history of other (healed) physical injury and trauma: Secondary | ICD-10-CM

## 2014-02-04 DIAGNOSIS — I509 Heart failure, unspecified: Secondary | ICD-10-CM | POA: Diagnosis not present

## 2014-02-04 DIAGNOSIS — N186 End stage renal disease: Secondary | ICD-10-CM | POA: Insufficient documentation

## 2014-02-04 DIAGNOSIS — R011 Cardiac murmur, unspecified: Secondary | ICD-10-CM | POA: Insufficient documentation

## 2014-02-04 DIAGNOSIS — G479 Sleep disorder, unspecified: Secondary | ICD-10-CM | POA: Diagnosis not present

## 2014-02-04 DIAGNOSIS — F329 Major depressive disorder, single episode, unspecified: Secondary | ICD-10-CM | POA: Insufficient documentation

## 2014-02-04 DIAGNOSIS — Z3202 Encounter for pregnancy test, result negative: Secondary | ICD-10-CM | POA: Insufficient documentation

## 2014-02-04 DIAGNOSIS — J449 Chronic obstructive pulmonary disease, unspecified: Secondary | ICD-10-CM | POA: Diagnosis present

## 2014-02-04 DIAGNOSIS — Z008 Encounter for other general examination: Secondary | ICD-10-CM

## 2014-02-04 DIAGNOSIS — R7301 Impaired fasting glucose: Secondary | ICD-10-CM | POA: Diagnosis not present

## 2014-02-04 DIAGNOSIS — K219 Gastro-esophageal reflux disease without esophagitis: Secondary | ICD-10-CM

## 2014-02-04 DIAGNOSIS — Z79899 Other long term (current) drug therapy: Secondary | ICD-10-CM | POA: Insufficient documentation

## 2014-02-04 DIAGNOSIS — F3162 Bipolar disorder, current episode mixed, moderate: Secondary | ICD-10-CM | POA: Diagnosis not present

## 2014-02-04 DIAGNOSIS — Z0289 Encounter for other administrative examinations: Secondary | ICD-10-CM | POA: Diagnosis not present

## 2014-02-04 DIAGNOSIS — I10 Essential (primary) hypertension: Secondary | ICD-10-CM | POA: Diagnosis not present

## 2014-02-04 DIAGNOSIS — G40909 Epilepsy, unspecified, not intractable, without status epilepticus: Secondary | ICD-10-CM

## 2014-02-04 DIAGNOSIS — I120 Hypertensive chronic kidney disease with stage 5 chronic kidney disease or end stage renal disease: Secondary | ICD-10-CM | POA: Diagnosis present

## 2014-02-04 DIAGNOSIS — Z91410 Personal history of adult physical and sexual abuse: Secondary | ICD-10-CM | POA: Diagnosis not present

## 2014-02-04 DIAGNOSIS — Z9114 Patient's other noncompliance with medication regimen: Secondary | ICD-10-CM | POA: Diagnosis present

## 2014-02-04 DIAGNOSIS — E119 Type 2 diabetes mellitus without complications: Secondary | ICD-10-CM | POA: Insufficient documentation

## 2014-02-04 DIAGNOSIS — F17200 Nicotine dependence, unspecified, uncomplicated: Secondary | ICD-10-CM | POA: Insufficient documentation

## 2014-02-04 DIAGNOSIS — I129 Hypertensive chronic kidney disease with stage 1 through stage 4 chronic kidney disease, or unspecified chronic kidney disease: Secondary | ICD-10-CM

## 2014-02-04 DIAGNOSIS — E785 Hyperlipidemia, unspecified: Secondary | ICD-10-CM | POA: Diagnosis present

## 2014-02-04 DIAGNOSIS — R946 Abnormal results of thyroid function studies: Secondary | ICD-10-CM | POA: Diagnosis not present

## 2014-02-04 DIAGNOSIS — F17210 Nicotine dependence, cigarettes, uncomplicated: Secondary | ICD-10-CM | POA: Diagnosis present

## 2014-02-04 DIAGNOSIS — G4700 Insomnia, unspecified: Secondary | ICD-10-CM | POA: Diagnosis present

## 2014-02-04 DIAGNOSIS — J45909 Unspecified asthma, uncomplicated: Secondary | ICD-10-CM | POA: Diagnosis present

## 2014-02-04 DIAGNOSIS — Z8249 Family history of ischemic heart disease and other diseases of the circulatory system: Secondary | ICD-10-CM | POA: Diagnosis not present

## 2014-02-04 DIAGNOSIS — N185 Chronic kidney disease, stage 5: Secondary | ICD-10-CM | POA: Diagnosis present

## 2014-02-04 DIAGNOSIS — F3161 Bipolar disorder, current episode mixed, mild: Secondary | ICD-10-CM

## 2014-02-04 DIAGNOSIS — Z62810 Personal history of physical and sexual abuse in childhood: Secondary | ICD-10-CM | POA: Diagnosis present

## 2014-02-04 DIAGNOSIS — G894 Chronic pain syndrome: Secondary | ICD-10-CM | POA: Diagnosis not present

## 2014-02-04 DIAGNOSIS — F4310 Post-traumatic stress disorder, unspecified: Secondary | ICD-10-CM | POA: Diagnosis present

## 2014-02-04 DIAGNOSIS — J4489 Other specified chronic obstructive pulmonary disease: Secondary | ICD-10-CM | POA: Insufficient documentation

## 2014-02-04 DIAGNOSIS — F609 Personality disorder, unspecified: Secondary | ICD-10-CM | POA: Diagnosis present

## 2014-02-04 DIAGNOSIS — J3089 Other allergic rhinitis: Secondary | ICD-10-CM | POA: Diagnosis not present

## 2014-02-04 LAB — SALICYLATE LEVEL: Salicylate Lvl: <2 mg/dL — ABNORMAL LOW (ref 2.8–20.0)

## 2014-02-04 LAB — CBC WITH DIFFERENTIAL/PLATELET
Basophils Absolute: 0 10*3/uL (ref 0.0–0.1)
Basophils Relative: 1 % (ref 0–1)
Eosinophils Absolute: 0.2 10*3/uL (ref 0.0–0.7)
Eosinophils Relative: 3 % (ref 0–5)
HCT: 25 % — ABNORMAL LOW (ref 36.0–46.0)
Hemoglobin: 7.6 g/dL — ABNORMAL LOW (ref 12.0–15.0)
Lymphocytes Relative: 33 % (ref 12–46)
Lymphs Abs: 1.7 10*3/uL (ref 0.7–4.0)
MCH: 30.6 pg (ref 26.0–34.0)
MCHC: 30.4 g/dL (ref 30.0–36.0)
MCV: 100.8 fL — ABNORMAL HIGH (ref 78.0–100.0)
Monocytes Absolute: 0.4 10*3/uL (ref 0.1–1.0)
Monocytes Relative: 7 % (ref 3–12)
Neutro Abs: 2.9 10*3/uL (ref 1.7–7.7)
Neutrophils Relative %: 56 % (ref 43–77)
Platelets: 342 10*3/uL (ref 150–400)
RBC: 2.48 MIL/uL — ABNORMAL LOW (ref 3.87–5.11)
RDW: 17.8 % — ABNORMAL HIGH (ref 11.5–15.5)
WBC: 5.1 10*3/uL (ref 4.0–10.5)

## 2014-02-04 LAB — COMPREHENSIVE METABOLIC PANEL
ALT: 6 U/L (ref 0–35)
AST: 10 U/L (ref 0–37)
Albumin: 3.2 g/dL — ABNORMAL LOW (ref 3.5–5.2)
Alkaline Phosphatase: 50 U/L (ref 39–117)
Anion gap: 12 (ref 5–15)
BUN: 49 mg/dL — ABNORMAL HIGH (ref 6–23)
CO2: 26 mEq/L (ref 19–32)
Calcium: 10.4 mg/dL (ref 8.4–10.5)
Chloride: 101 mEq/L (ref 96–112)
Creatinine, Ser: 4.5 mg/dL — ABNORMAL HIGH (ref 0.50–1.10)
GFR calc Af Amer: 12 mL/min — ABNORMAL LOW (ref >90)
GFR calc non Af Amer: 10 mL/min — ABNORMAL LOW (ref >90)
Glucose, Bld: 135 mg/dL — ABNORMAL HIGH (ref 70–99)
Potassium: 4 mEq/L (ref 3.7–5.3)
Sodium: 139 mEq/L (ref 137–147)
Total Bilirubin: <0.2 mg/dL — ABNORMAL LOW (ref 0.3–1.2)
Total Protein: 7.6 g/dL (ref 6.0–8.3)

## 2014-02-04 LAB — URINALYSIS, ROUTINE W REFLEX MICROSCOPIC
Bilirubin Urine: NEGATIVE
Glucose, UA: NEGATIVE mg/dL
Hgb urine dipstick: NEGATIVE
Ketones, ur: NEGATIVE mg/dL
Leukocytes, UA: NEGATIVE
Nitrite: NEGATIVE
Protein, ur: 100 mg/dL — AB
Specific Gravity, Urine: 1.007 (ref 1.005–1.030)
Urobilinogen, UA: 0.2 mg/dL (ref 0.0–1.0)
pH: 7 (ref 5.0–8.0)

## 2014-02-04 LAB — RAPID URINE DRUG SCREEN, HOSP PERFORMED
Amphetamines: NOT DETECTED
Barbiturates: NOT DETECTED
Benzodiazepines: NOT DETECTED
Cocaine: NOT DETECTED
Opiates: NOT DETECTED
Tetrahydrocannabinol: NOT DETECTED

## 2014-02-04 LAB — URINE MICROSCOPIC-ADD ON

## 2014-02-04 LAB — VALPROIC ACID LEVEL: Valproic Acid Lvl: 11.9 ug/mL — ABNORMAL LOW (ref 50.0–100.0)

## 2014-02-04 LAB — PREGNANCY, URINE: Preg Test, Ur: NEGATIVE

## 2014-02-04 LAB — ETHANOL: Alcohol, Ethyl (B): <11 mg/dL (ref 0–11)

## 2014-02-04 MED ORDER — DOXEPIN HCL 50 MG PO CAPS
50.0000 mg | ORAL_CAPSULE | Freq: Every day | ORAL | Status: DC
  Administered 2014-02-04: 50 mg via ORAL
  Filled 2014-02-04 (×2): qty 1

## 2014-02-04 MED ORDER — AMLODIPINE BESYLATE 10 MG PO TABS
10.0000 mg | ORAL_TABLET | Freq: Every day | ORAL | Status: DC
  Administered 2014-02-04 – 2014-02-05 (×2): 10 mg via ORAL
  Filled 2014-02-04 (×2): qty 1

## 2014-02-04 MED ORDER — ALBUTEROL SULFATE (2.5 MG/3ML) 0.083% IN NEBU
2.5000 mg | INHALATION_SOLUTION | RESPIRATORY_TRACT | Status: DC | PRN
  Administered 2014-02-04 – 2014-02-05 (×2): 2.5 mg via RESPIRATORY_TRACT
  Filled 2014-02-04 (×2): qty 3

## 2014-02-04 MED ORDER — MOMETASONE FURO-FORMOTEROL FUM 100-5 MCG/ACT IN AERO
2.0000 | INHALATION_SPRAY | Freq: Two times a day (BID) | RESPIRATORY_TRACT | Status: DC
  Administered 2014-02-04 – 2014-02-05 (×2): 2 via RESPIRATORY_TRACT
  Filled 2014-02-04: qty 8.8

## 2014-02-04 MED ORDER — FUROSEMIDE 80 MG PO TABS
120.0000 mg | ORAL_TABLET | Freq: Two times a day (BID) | ORAL | Status: DC
  Administered 2014-02-04 – 2014-02-05 (×2): 120 mg via ORAL
  Filled 2014-02-04 (×4): qty 1

## 2014-02-04 MED ORDER — HYDROXYZINE HCL 25 MG PO TABS: 12.5000 mg | ORAL_TABLET | Freq: Four times a day (QID) | ORAL | Status: DC | PRN

## 2014-02-04 MED ORDER — ZIPRASIDONE HCL 20 MG PO CAPS
60.0000 mg | ORAL_CAPSULE | Freq: Two times a day (BID) | ORAL | Status: DC
  Administered 2014-02-04 – 2014-02-05 (×2): 60 mg via ORAL
  Filled 2014-02-04 (×2): qty 3

## 2014-02-04 MED ORDER — DIVALPROEX SODIUM ER 500 MG PO TB24
500.0000 mg | ORAL_TABLET | Freq: Every day | ORAL | Status: DC
  Administered 2014-02-04: 500 mg via ORAL
  Filled 2014-02-04 (×2): qty 1

## 2014-02-04 NOTE — ED Notes (Signed)
Jennifer Lawson, Jennifer Lawson at patient bedside. Jennifer Lawson, Jennifer Lawson came out and reported he sees no change in patient status.
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
## 2014-02-04 NOTE — ED Notes (Signed)
Jennifer Clan, Pa called in reference to starting patient home medications.Jennifer Lawson, [place] reported that he would review patient's chart and re-start patient medications.

## 2014-02-04 NOTE — ED Notes (Signed)
Patient brought from [facility] accompanied by [employer]. IVC papers state that the patient is paranoid and delusional. Patient is things move in the house and calling daughter multiple times during the day. Patient also has been calling the police to report the daughter. Patient has not been sleeping. Patient has a history of bipolar.

## 2014-02-04 NOTE — ED Notes (Signed)
Patient is constantly asking for her night time medication. Patient informed that I would have to contact doctor in order to get medications restarted. Encouragement and support provided and safety maintain.
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
## 2014-02-04 NOTE — ED Notes (Signed)
Spoke with PA concerning medical history and medications.  Unable to communicate with patient due to paranoia and confusion.  Support,food,fluids given.

## 2014-02-04 NOTE — ED Notes (Signed)
Patient c/o SOB and needing "a breathing treatment".  PA [facility]) informed.

## 2014-02-05 ENCOUNTER — Encounter (HOSPITAL_COMMUNITY): Payer: Self-pay | Admitting: *Deleted

## 2014-02-05 ENCOUNTER — Inpatient Hospital Stay (HOSPITAL_COMMUNITY)
Admission: AD | Admit: 2014-02-05 | Discharge: 2014-02-12 | DRG: 885 | Disposition: A | Discharge status: Home / Self Care | Payer: Medicare Other | Source: Intra-hospital | Attending: Psychiatry | Admitting: Psychiatry

## 2014-02-05 DIAGNOSIS — F3160 Bipolar disorder, current episode mixed, unspecified: Secondary | ICD-10-CM

## 2014-02-05 DIAGNOSIS — D649 Anemia, unspecified: Secondary | ICD-10-CM

## 2014-02-05 DIAGNOSIS — N185 Chronic kidney disease, stage 5: Secondary | ICD-10-CM | POA: Diagnosis present

## 2014-02-05 DIAGNOSIS — Z833 Family history of diabetes mellitus: Secondary | ICD-10-CM

## 2014-02-05 DIAGNOSIS — J45909 Unspecified asthma, uncomplicated: Secondary | ICD-10-CM | POA: Diagnosis present

## 2014-02-05 DIAGNOSIS — Z9114 Patient's other noncompliance with medication regimen: Secondary | ICD-10-CM | POA: Diagnosis present

## 2014-02-05 DIAGNOSIS — R34 Anuria and oliguria: Secondary | ICD-10-CM

## 2014-02-05 DIAGNOSIS — F3162 Bipolar disorder, current episode mixed, moderate: Principal | ICD-10-CM | POA: Diagnosis present

## 2014-02-05 DIAGNOSIS — F4310 Post-traumatic stress disorder, unspecified: Secondary | ICD-10-CM | POA: Diagnosis present

## 2014-02-05 DIAGNOSIS — G4700 Insomnia, unspecified: Secondary | ICD-10-CM | POA: Diagnosis present

## 2014-02-05 DIAGNOSIS — F319 Bipolar disorder, unspecified: Secondary | ICD-10-CM

## 2014-02-05 DIAGNOSIS — Z91410 Personal history of adult physical and sexual abuse: Secondary | ICD-10-CM | POA: Diagnosis not present

## 2014-02-05 DIAGNOSIS — J449 Chronic obstructive pulmonary disease, unspecified: Secondary | ICD-10-CM | POA: Diagnosis present

## 2014-02-05 DIAGNOSIS — I120 Hypertensive chronic kidney disease with stage 5 chronic kidney disease or end stage renal disease: Secondary | ICD-10-CM | POA: Diagnosis present

## 2014-02-05 DIAGNOSIS — Z62810 Personal history of physical and sexual abuse in childhood: Secondary | ICD-10-CM | POA: Diagnosis present

## 2014-02-05 DIAGNOSIS — F17210 Nicotine dependence, cigarettes, uncomplicated: Secondary | ICD-10-CM | POA: Diagnosis present

## 2014-02-05 DIAGNOSIS — Z8249 Family history of ischemic heart disease and other diseases of the circulatory system: Secondary | ICD-10-CM | POA: Diagnosis not present

## 2014-02-05 DIAGNOSIS — F609 Personality disorder, unspecified: Secondary | ICD-10-CM | POA: Diagnosis present

## 2014-02-05 DIAGNOSIS — E785 Hyperlipidemia, unspecified: Secondary | ICD-10-CM | POA: Diagnosis present

## 2014-02-05 DIAGNOSIS — F1020 Alcohol dependence, uncomplicated: Secondary | ICD-10-CM

## 2014-02-05 DIAGNOSIS — F17200 Nicotine dependence, unspecified, uncomplicated: Secondary | ICD-10-CM

## 2014-02-05 DIAGNOSIS — R7303 Prediabetes: Secondary | ICD-10-CM

## 2014-02-05 DIAGNOSIS — K219 Gastro-esophageal reflux disease without esophagitis: Secondary | ICD-10-CM | POA: Diagnosis present

## 2014-02-05 DIAGNOSIS — J410 Simple chronic bronchitis: Secondary | ICD-10-CM

## 2014-02-05 DIAGNOSIS — N184 Chronic kidney disease, stage 4 (severe): Secondary | ICD-10-CM

## 2014-02-05 MED ORDER — ALBUTEROL SULFATE (2.5 MG/3ML) 0.083% IN NEBU
2.5000 mg | INHALATION_SOLUTION | RESPIRATORY_TRACT | Status: DC | PRN
  Administered 2014-02-05 – 2014-02-12 (×15): 2.5 mg via RESPIRATORY_TRACT
  Filled 2014-02-05 (×15): qty 3

## 2014-02-05 MED ORDER — DOXEPIN HCL 50 MG PO CAPS
50.0000 mg | ORAL_CAPSULE | Freq: Every day | ORAL | Status: DC
  Administered 2014-02-07 – 2014-02-09 (×3): 50 mg via ORAL
  Filled 2014-02-05 (×2): qty 1
  Filled 2014-02-05: qty 2
  Filled 2014-02-05 (×5): qty 1

## 2014-02-05 MED ORDER — ZIPRASIDONE HCL 60 MG PO CAPS
60.0000 mg | ORAL_CAPSULE | Freq: Two times a day (BID) | ORAL | Status: DC
  Administered 2014-02-05: 60 mg via ORAL
  Filled 2014-02-05 (×6): qty 1

## 2014-02-05 MED ORDER — HYDROXYZINE HCL 25 MG PO TABS
12.5000 mg | ORAL_TABLET | Freq: Four times a day (QID) | ORAL | Status: DC | PRN
  Filled 2014-02-05: qty 1

## 2014-02-05 MED ORDER — MOMETASONE FURO-FORMOTEROL FUM 100-5 MCG/ACT IN AERO
2.0000 | INHALATION_SPRAY | Freq: Two times a day (BID) | RESPIRATORY_TRACT | Status: DC
  Administered 2014-02-05 – 2014-02-12 (×14): 2 via RESPIRATORY_TRACT
  Filled 2014-02-05: qty 8.8

## 2014-02-05 MED ORDER — ACETAMINOPHEN 325 MG PO TABS
650.0000 mg | ORAL_TABLET | Freq: Four times a day (QID) | ORAL | Status: DC | PRN
  Administered 2014-02-06 (×2): 650 mg via ORAL
  Filled 2014-02-05 (×2): qty 2

## 2014-02-05 MED ORDER — FUROSEMIDE 40 MG PO TABS
120.0000 mg | ORAL_TABLET | Freq: Two times a day (BID) | ORAL | Status: DC
  Administered 2014-02-05 – 2014-02-08 (×5): 120 mg via ORAL
  Filled 2014-02-05 (×11): qty 3

## 2014-02-05 MED ORDER — DIVALPROEX SODIUM ER 500 MG PO TB24
500.0000 mg | ORAL_TABLET | Freq: Every day | ORAL | Status: DC
  Administered 2014-02-05 – 2014-02-09 (×5): 500 mg via ORAL
  Filled 2014-02-05 (×8): qty 1

## 2014-02-05 MED ORDER — MAGNESIUM HYDROXIDE 400 MG/5ML PO SUSP
30.0000 mL | Freq: Every day | ORAL | Status: DC | PRN
  Administered 2014-02-06 – 2014-02-09 (×2): 30 mL via ORAL

## 2014-02-05 MED ORDER — AMLODIPINE BESYLATE 10 MG PO TABS
10.0000 mg | ORAL_TABLET | Freq: Every day | ORAL | Status: DC
  Filled 2014-02-05 (×3): qty 1

## 2014-02-05 NOTE — ED Provider Notes (Signed)
Medical screening examination/treatment/procedure(s) were performed by non-physician practitioner and as supervising physician I was immediately available for consultation/collaboration.   EKG Interpretation None        Ernestina Patches, MD 02/05/14 1419

## 2014-02-05 NOTE — ED Notes (Signed)
Informed respiratory therapy of pt request for breathing treatment.

## 2014-02-05 NOTE — Consult Note (Signed)
  Jennifer Lawson has been accepted to [facility] bed 407-2 for treatment of her bipolar disorder currently mixed.  She maintains she is okay and wants to go home but the history suggests otherwise.
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
## 2014-02-05 NOTE — Care Management Utilization Note (Signed)
Per State Regulation 482.30  The chart was reviewed for necessity with respect to the patient's Admission/ Duration of stay. 02/05/14  Next Review Date: 47/3/40  Conception Oms, RN, BSN

## 2014-02-05 NOTE — BH Assessment (Signed)
Pt accepted to [facility] by Dr. Darleene Cleaver. Room assignment 407-2. Support paperwork completed. Nursing report # to [phone].

## 2014-02-05 NOTE — Tx Team (Signed)
Initial Interdisciplinary Treatment Plan   PATIENT STRESSORS: Marital or family conflict   PROBLEM LIST: Problem List/Patient Goals Date to be addressed Date deferred Reason deferred Estimated date of resolution                                                         DISCHARGE CRITERIA:  Improved stabilization in mood, thinking, and/or behavior Need for constant or close observation no longer present Reduction of life-threatening or endangering symptoms to within safe limits  PRELIMINARY DISCHARGE PLAN: Outpatient therapy Return to previous living arrangement  PATIENT/FAMIILY INVOLVEMENT: This treatment plan has been presented to and reviewed with the patient, ROYA GIESELMAN.  The patient and family have been given the opportunity to ask questions and make suggestions.  Marilynne Halsted M 02/05/2014, 2:42 PM

## 2014-02-06 ENCOUNTER — Encounter (HOSPITAL_COMMUNITY): Payer: Self-pay | Admitting: Psychiatry

## 2014-02-06 DIAGNOSIS — F3162 Bipolar disorder, current episode mixed, moderate: Principal | ICD-10-CM

## 2014-02-06 LAB — TSH: TSH: 0.961 u[IU]/mL (ref 0.350–4.500)

## 2014-02-06 LAB — LIPID PANEL
Cholesterol: 227 mg/dL — ABNORMAL HIGH (ref 0–200)
HDL: 62 mg/dL (ref >39)
LDL Cholesterol: 117 mg/dL — ABNORMAL HIGH (ref 0–99)
Total CHOL/HDL Ratio: 3.7 RATIO
Triglycerides: 238 mg/dL — ABNORMAL HIGH (ref <150)
VLDL: 48 mg/dL — ABNORMAL HIGH (ref 0–40)

## 2014-02-06 LAB — GLUCOSE, CAPILLARY: Glucose-Capillary: 82 mg/dL (ref 70–99)

## 2014-02-06 LAB — VITAMIN B12: Vitamin B-12: 1065 pg/mL — ABNORMAL HIGH (ref 211–911)

## 2014-02-06 LAB — FOLATE: Folate: 7.5 ng/mL

## 2014-02-06 LAB — HEMOGLOBIN A1C
Hgb A1c MFr Bld: 4.9 % (ref <5.7)
Mean Plasma Glucose: 94 mg/dL (ref <117)

## 2014-02-06 LAB — T4, FREE: Free T4: 1.01 ng/dL (ref 0.80–1.80)

## 2014-02-06 LAB — T3, FREE: T3, Free: 2.2 pg/mL — ABNORMAL LOW (ref 2.3–4.2)

## 2014-02-06 MED ORDER — SIMVASTATIN 5 MG PO TABS
5.0000 mg | ORAL_TABLET | Freq: Every day | ORAL | Status: DC
  Filled 2014-02-06 (×2): qty 1

## 2014-02-06 MED ORDER — GLUCERNA SHAKE PO LIQD
237.0000 mL | Freq: Two times a day (BID) | ORAL | Status: DC
  Administered 2014-02-06 – 2014-02-11 (×7): 237 mL via ORAL
  Filled 2014-02-06 (×2): qty 237

## 2014-02-06 MED ORDER — NICOTINE POLACRILEX 2 MG MT GUM
2.0000 mg | CHEWING_GUM | OROMUCOSAL | Status: DC | PRN
  Administered 2014-02-06: 2 mg via ORAL
  Filled 2014-02-06: qty 1

## 2014-02-06 MED ORDER — NICOTINE 21 MG/24HR TD PT24
21.0000 mg | MEDICATED_PATCH | Freq: Every day | TRANSDERMAL | Status: DC
  Administered 2014-02-06 – 2014-02-12 (×7): 21 mg via TRANSDERMAL
  Filled 2014-02-06 (×9): qty 1

## 2014-02-06 MED ORDER — NICOTINE 21 MG/24HR TD PT24: 21.0000 mg | MEDICATED_PATCH | Freq: Every day | TRANSDERMAL | Status: DC

## 2014-02-06 MED ORDER — ZIPRASIDONE HCL 60 MG PO CAPS
60.0000 mg | ORAL_CAPSULE | Freq: Two times a day (BID) | ORAL | Status: DC
  Administered 2014-02-06 – 2014-02-08 (×4): 60 mg via ORAL
  Filled 2014-02-06 (×9): qty 1

## 2014-02-06 MED ORDER — AMLODIPINE BESYLATE 10 MG PO TABS
10.0000 mg | ORAL_TABLET | Freq: Every day | ORAL | Status: DC
  Administered 2014-02-06 – 2014-02-07 (×2): 10 mg via ORAL
  Filled 2014-02-06 (×4): qty 1

## 2014-02-06 MED ORDER — SIMVASTATIN 5 MG PO TABS
5.0000 mg | ORAL_TABLET | Freq: Every day | ORAL | Status: DC
  Administered 2014-02-06 – 2014-02-07 (×2): 5 mg via ORAL
  Filled 2014-02-06 (×4): qty 1

## 2014-02-06 NOTE — Progress Notes (Signed)
Pt stated that she had a good day. Her goal for tomorrow is to go home and be with her family. She plans to be more patient and understanding with her family. Also wants to attend some group therapy sessions outside of [facility].

## 2014-02-06 NOTE — BHH Suicide Risk Assessment (Signed)
   Nursing information obtained from:  Patient Demographic factors:  Low socioeconomic status Current Mental Status:  NA Loss Factors:  NA Historical Factors:  NA Risk Reduction Factors:  Sense of responsibility to family Total Time spent with patient: 30 minutes  CLINICAL FACTORS:   Previous Psychiatric Diagnoses and Treatments Medical Diagnoses and Treatments/Surgeries  Psychiatric Specialty Exam: Physical Exam  Constitutional: She is oriented to person, place, and time. She appears well-developed and well-nourished.  HENT:  Head: Normocephalic and atraumatic.  Eyes: Conjunctivae are normal. Pupils are equal, round, and reactive to light.  Neck: Normal range of motion. Neck supple.  Cardiovascular: Normal rate and regular rhythm.   Respiratory: Effort normal and breath sounds normal.  GI: Soft.  Musculoskeletal: Normal range of motion.  Neurological: She is alert and oriented to person, place, and time.  Skin: Skin is warm.  Psychiatric: Her speech is normal and behavior is normal. Her affect is labile. Thought content is paranoid and delusional. Cognition and memory are normal. She expresses impulsivity. She exhibits a depressed mood. She expresses no homicidal and no suicidal ideation.    Review of Systems  Constitutional: Negative.   HENT: Negative.   Eyes: Negative.   Respiratory: Negative.   Cardiovascular: Negative.   Gastrointestinal: Negative.   Genitourinary: Negative.   Musculoskeletal: Positive for back pain.  Skin: Negative.   Neurological: Negative.   Psychiatric/Behavioral: Positive for depression and substance abuse. Negative for suicidal ideas. The patient is nervous/anxious and has insomnia.     Blood pressure 124/82, pulse 99, temperature 98.8 F (37.1 C), temperature source Oral, resp. rate 17, height 5' 0.5" (1.537 m), weight 83.462 kg (184 lb), last menstrual period 05/09/2008, SpO2 99.00%.Body mass index is 35.33 kg/(m^2).  General Appearance: Casual   Eye Contact::  Fair  Speech:  Normal Rate  Volume:  Normal  Mood:  Anxious and Depressed  Affect:  Labile  Thought Process:  Irrelevant  Orientation:  Full (Time, Place, and Person)  Thought Content:  Delusions  Suicidal Thoughts:  No  Homicidal Thoughts:  No  Memory:  Immediate;   Fair Recent;   Fair Remote;   Fair  Judgement:  Impaired  Insight:  Lacking  Psychomotor Activity:  Normal  Concentration:  Poor  Recall:  [employer] of Knowledge:Fair  Language: Good  Akathisia:  No    AIMS (if indicated):   0  Assets:  Communication Skills  Sleep:  Number of Hours: 6   Musculoskeletal: Strength & Muscle Tone: within normal limits Gait & Station: normal Patient leans: N/A  COGNITIVE FEATURES THAT CONTRIBUTE TO RISK:  Closed-mindedness Polarized thinking Thought constriction (tunnel vision)    SUICIDE RISK:   Minimal: No identifiable suicidal ideation.  Patients presenting with no risk factors but with morbid ruminations; may be classified as minimal risk based on the severity of the depressive symptoms  PLAN OF CARE:Please see H&P.   I certify that inpatient services furnished can reasonably be expected to improve the patient's condition.  Clemencia Helzer md 02/06/2014, 2:03 PM
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
## 2014-02-06 NOTE — BHH Group Notes (Signed)
[facility] Group Notes:  (Nursing/MHT/Case Management/Adjunct)  Date:  02/06/2014  Time:  11:56 AM  Type of Therapy:  Nurse Education  Participation Level:  None  Participation Quality:  Inattentive  Affect:  Flat  Cognitive:  Alert  Insight:  None  Engagement in Group:  None  Modes of Intervention:  Discussion and Education  Summary of Progress/Problems: This group discusses leisure and lifestyle changes and how those can incorporate together to produce wellness for patients. This includes taking medication, self assessment, diet, and exercise.  Patient came to group late, sat and stared at the floor, then left early. Patient did not contribute to group.   Jennifer Lawson 02/06/2014, 11:56 AM

## 2014-02-06 NOTE — Progress Notes (Signed)
Patient ID: Jennifer Lawson, female   DOB: November 23, 1960, 54 y.o.   MRN: [id number] D: client visible in dayroom visiting with daughter, interaction seemed tense, client exclaims "we love each other, but see she think she know everything, see" "why don't you stop interrupting and let me talk to the lady about my medicine" client is suspicious, and cautions, reports she moved here from [place], because daughter needed help with her baby. "I been taking care of him since he was months old, now he's [age] and I guess she don't need me as much cause he getting ready to start school" "she be telling me about personal things and when I try to help her she gets upset with me and we butt heads" "I came here after being at the hospital for a allergic reaction to gabapentin" "I aspirated and got pneumonia" A: Writer provided emotional support, reviewed medications administered accordingly. Staff will monitor q38min for safety. R: Client is safe on the unit, attended group.

## 2014-02-06 NOTE — Progress Notes (Addendum)
Patient ID: Jennifer Lawson, female   DOB: 1961/01/16, 53 y.o.   MRN: [id number]  D: Pt. Denies SI/HI and A/V Hallucinations to this Probation officer. Patient does report pain in her lower back however refuses medication. Patient has received heat packs throughout the day that patient reports has helped some. Patient did not turn in daily inventory sheet however she was encouraged. Patient received a nebulizing treatment and reported it's effectiveness. Patient can be intrusive at times and irritable when her questions aren't answered immediately however she is redirectable.                                                   A: Support and encouragement provided to the patient. Scheduled medications administered to patient per physician's orders. Living Well with diabetes booklet given to patient per diabetes coordinator instruction. Patient was thankful for this informational guide.  R: Patient is cooperative with her treatment plan at this time. Patient is seen in the milieu and is attending some groups. Q15 minute checks are maintained for safety.

## 2014-02-06 NOTE — Progress Notes (Signed)
NUTRITION ASSESSMENT  Pt identified as at risk on the Malnutrition Screen Tool  INTERVENTION: 1. Educated patient on the importance of nutrition and encouraged intake of food and beverages. 2. Supplements: Glucerna shakes BID 3. Provided handouts on diet therapy for DM and CKD which were reviewed with pt who was appreciative of information   NUTRITION DIAGNOSIS: Unintentional weight loss related to sub-optimal intake as evidenced by pt report.   Goal: Pt to meet >/= 90% of their estimated nutrition needs.  Monitor:  PO intake  Assessment:  Admitted with bizarre behavior. Hx of bipolar disorder and PTSD.   - Met with pt who reports no appetite PTA - Avoids salt and sugar - Said she eats 3 meals/day at home: biscuit/jelly for breakfast, vegetables for lunch, and soup/sandwich for dinner - Pt reports she wasn't checking her blood sugar at home because "my doctor never told me to" despite having hx of DM  - Pt with CKD and reports her potassium has been high and wanted to know what foods to avoid - Agreeable to getting nutritional shakes - Pt admitted earlier this month to [facility] requiring intubation and was eating 75% of meals after extubation   53 y.o. female  Height: Ht Readings from Last 1 Encounters:  02/05/14 5' 0.5" (1.537 m)    Weight: Wt Readings from Last 1 Encounters:  02/05/14 184 lb (83.462 kg)    Weight Hx: Wt Readings from Last 10 Encounters:  02/05/14 184 lb (83.462 kg)  01/20/14 192 lb 7.4 oz (87.3 kg)  09/24/13 195 lb (88.451 kg)  06/30/13 225 lb 8.5 oz (102.3 kg)  06/11/13 213 lb (96.616 kg)  04/26/13 210 lb 5.1 oz (95.4 kg)  02/20/13 208 lb 12.8 oz (94.711 kg)  01/22/13 199 lb 1.2 oz (90.3 kg)  11/13/12 209 lb 3.5 oz (94.9 kg)  11/13/12 209 lb 3.5 oz (94.9 kg)    BMI:  Body mass index is 35.33 kg/(m^2). Pt meets criteria for class II obesity based on current BMI.  Estimated Nutritional Needs: Kcal: 25-30 kcal/kg of ideal body  weight Protein: > 1 gram protein/kg Fluid: 1 ml/kcal  Diet Order: Carb Control Pt is also offered choice of unit snacks mid-morning and mid-afternoon.  Pt is eating as desired.   Lab results and medications reviewed.   Carlis Stable MS, [place], LDN [phone] Pager [phone] Weekend/After Hours Pager
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
## 2014-02-06 NOTE — Progress Notes (Signed)
Spoke with Staff RN taking care of patient. Received Diabetes Coordinator consult. In reading the history, patient has DM, but no medications listed on home meds.  HgbA1C is 4.9% on 02/06/14. Patient does have elevated BUN 49 and Creatinine  4.50 which could cause HgbA1C result to be slightly off.   Recommend checking CBGs TID before meals for a few days to see how blood sugars are running. Make sure patient knows how to check own blood sugars at home and can get a home blood glucose meter.  Have dietician to see patient and give instruction on meal planning. Patient should be on CHO Modified Medium diet. Living Well with Diabetes booklet can be ordered from pharmacy. If unable to get booklet, can give patient [place] notes on diabetes. Harvel Ricks RN BSN CDE

## 2014-02-06 NOTE — Plan of Care (Signed)
Problem: Ineffective individual coping Goal: STG: Patient will remain free from self harm Outcome: Completed/Met Date Met:  02/06/14 Patient has not tried to self harm while on the unit.

## 2014-02-06 NOTE — BHH Group Notes (Signed)
[place] LCSW Group Therapy  02/06/2014 2:46 PM  Type of Therapy:  Group Therapy  Participation Level:  Active  Participation Quality:  Attentive  Affect:  Flat  Cognitive:  Alert and Oriented  Insight:  Improving  Engagement in Therapy:  Improving  Modes of Intervention:  Confrontation, Discussion, Education, Exploration, Problem-solving, Rapport Building, Socialization and Support  Summary of Progress/Problems: Emotion Regulation: This group focused on both positive and negative emotion identification and allowed group members to process ways to identify feelings, regulate negative emotions, and find healthy ways to manage internal/external emotions. Group members were asked to reflect on a time when their reaction to an emotion led to a negative outcome and explored how alternative responses using emotion regulation would have benefited them. Group members were also asked to discuss a time when emotion regulation was utilized when a negative emotion was experienced. Jennifer Lawson was attentive and engaged during today's processing group. She shared that anger and frustration were the emotions that she struggled with most. Jennifer Lawson shows progress in the group setting and improving insight AEB her ability to process how her reactions to anger (yelling, "going off", blurting out mean things) has not been constructive for her in the past or resolved any issues. Jennifer Lawson was able to share an experience where she felt frustrated but was able to handle the situation and move on from it.    Smart, Ayako Tapanes LCSWA  02/06/2014, 2:46 PM

## 2014-02-07 DIAGNOSIS — F23 Brief psychotic disorder: Secondary | ICD-10-CM

## 2014-02-07 LAB — GLUCOSE, CAPILLARY: Glucose-Capillary: 75 mg/dL (ref 70–99)

## 2014-02-07 NOTE — Progress Notes (Signed)
D:Pt is guarded and cautious with interaction. Pt was reluctant to take antipsychotic medication this morning and required much encouragement. Pt is focused on being discharged. She had been c/o constipation yesterday per report but now reports having a BM this morning.  A:Offered support and 15 minute checks. R:Pt denies si and hi. Safety maintained on the unit,

## 2014-02-07 NOTE — Plan of Care (Signed)
Problem: Ineffective individual coping Goal: LTG: Patient will report a decrease in negative feelings Outcome: Not Progressing Pt continues to have a labile mood with paranoid behavior.

## 2014-02-07 NOTE — BHH Group Notes (Signed)
[place] LCSW Group Therapy  02/07/2014 3:09 PM  Type of Therapy:  Group Therapy  Participation Level:  Active  Participation Quality:  Appropriate  Affect:  Irritable and Labile  Cognitive:  Appropriate  Insight:  Limited  Engagement in Therapy:  Defensive and Resistant  Modes of Intervention:  Discussion, Education, Socialization and Support  Summary of Progress/Problems:Finding Balance in Life. Today's group focused on defining balance in one's own words, identifying things that can knock one off balance, and exploring healthy ways to maintain balance in life. Group members were asked to provide an example of a time when they felt off balance, describe how they handled that situation, and process healthier ways to regain balance in the future. Group members were asked to share the most important tool for maintaining balance that they learned while at [facility] and how they plan to apply this method after discharge. Pt stated she feels unbalanced when she is sad or when people are inconsistent. During certain times of the year she feels sad due to trauma that has happened in her life. When she begins to feel sad, she tries to think positively. She spoke about inconsistencies with her medication many times throughout the group session. When another patient tried to engage with her she became defensive and irritable.    Lawson,Jennifer 02/07/2014, 3:09 PM

## 2014-02-07 NOTE — Progress Notes (Signed)
Patient ID: Jennifer Lawson, female   DOB: Aug 07, 1960, 53 y.o.   MRN: [id number] D: Client visible on unit, back and forth to the telephone, but doesn't seem to be getting any response to where she is calling. Client getting noticeably agitated, then begins to get somatic saying her back hurt, but refused medications, then says she need something for chest pain, refused any medications as she was asymptomatic, with Sats at 99% RA. A: Writer provided emotional support offering client pain relievers and heat packs. Staff will monitor q61min for safety. Writer will continue to monitor and observed for s/s of discomfort. R: client is safe on the unit, attended group.

## 2014-02-07 NOTE — Progress Notes (Signed)
Pt requested albuterol treatment for shortness of breath. Gave as ordered. Pt then reported that she was not hungry for dinner and requested yogurt for dinner. Gave with medication. She then requested peanut butter and c/o chest pain. Gave peanut butter and started to check vital signs. Pt held out rt arm. As Probation officer started to place cuff, noticed pink band and stopped. Pt became defensive and stated that writer should have known not to take vitals in that arm. Attempted to explain that the pink band help staff identify not to take vitals in her rt arm. She intensely stared at [employer] becoming defensive and irritable. Vitals were 118/72 HR 97. No other complaints at this time. Safety maintained.

## 2014-02-07 NOTE — BHH Suicide Risk Assessment (Signed)
[place] INPATIENT:  Family/Significant Other Suicide Prevention Education  Suicide Prevention Education:  Contact Attempts: Jennifer Lawson (pt's daughter) [phone] has been identified by the patient as the family member/significant other with whom the patient will be residing, and identified as the person(s) who will aid the patient in the event of a mental health crisis.  With written consent from the patient, two attempts were made to provide suicide prevention education, prior to and/or following the patient's discharge.  We were unsuccessful in providing suicide prevention education.  A suicide education pamphlet was given to the patient to share with family/significant other.  Date and time of first attempt: 12:00PM 02/07/14 mailbox full-unable to leave voicemail.  Date and time of second attempt: 3:00PM  02/07/14 mailbox full-unable to leave voicemail.   Smart, Jennifer Lawson LCSWA  02/07/2014,3:00PM

## 2014-02-07 NOTE — BHH Group Notes (Signed)
[facility] LCSW Aftercare Discharge Planning Group Note   02/07/2014 10:18 AM  Participation Quality:  Appropriate   Mood/Affect:  Appropriate  Depression Rating:  0  Anxiety Rating:  0  Thoughts of Suicide:  No Will you contract for safety?   NA  Current AVH:  No  Plan for Discharge/Comments:  Pt reports that she plan to return home with her daughter temporarily and will be looking for a "Place of my own." Pt wants to continue med management with Dr. Darleene Cleaver and is interested in individual therapy. CSW assessing. PT not open to Assisted living or group home placement. Delora met with pt to set her up for [facility] services.   Transportation Means: family member   Supports: family members   Smart, Research officer, trade union

## 2014-02-07 NOTE — Progress Notes (Signed)
[place] Group Notes:  (Nursing/MHT/Case Management/Adjunct)  Date:  02/07/2014  Time:  10:24 PM  Type of Therapy:  Psychoeducational Skills  Participation Level:  Active  Participation Quality:  Appropriate  Affect:  Depressed and Excited  Cognitive:  Disorganized  Insight:  Lacking  Engagement in Group:  Monopolizing  Modes of Intervention:  Education  Summary of Progress/Problems: The patient verbalized that she didn't have a very good day. The patient stated that the staff gave her incorrect information and were also inconsistent with their responses. She states that she was unhappy about the fact that her family did not come in to visit her.  On a more positive note, the patient verbalized that she was just simply grateful to be alive and for being able to wake up. In terms of the theme for the day, her relapse prevention will include not isolating herself as much.   Archie Balboa S 02/07/2014, 10:24 PM

## 2014-02-07 NOTE — Progress Notes (Signed)
[facility] MD Progress Note  02/07/2014 6:22 PM Jennifer Lawson  MRN:  [id number] Subjective:  Patient states she is uncomfortable with chronic pain and wanting to go home soon Objective: I have reviewed case with Dr. Shea Evans, whom I am covering for today. Patient presents with some irritability and limited insight.  She appears guarded, and states that she finds some of the questions she is asked by staff " ridiculous". She denies or minimizes recent events as noted in  HPI, although she does state she had called police and had been having " differences " with her daughter. States she had conversation with her daughter and they " worked things out".  Has been participating in milieu, and going to some groups. Staff has noted her to be irritable and at times becoming guarded, and sometimes demanding.  Has been compliant with medications, but needing encouragement from staff to accept.  Diagnosis:  Bipolar Disorder , with psychotic symptoms  Total Time spent with patient: 25 minutes     ADL's:  fair  Sleep: fair   Appetite:  Fair   Suicidal Ideation:  Denies SI  Homicidal Ideation:  Denies HI  AEB (as evidenced by):  Psychiatric Specialty Exam: Physical Exam  Review of Systems  Constitutional: Negative for fever and chills.  Respiratory: Negative for cough and shortness of breath.   Cardiovascular: Negative for chest pain.  Musculoskeletal: Positive for back pain.  Skin: Negative for rash.  Psychiatric/Behavioral: Positive for depression.       Guarded, irritable    Blood pressure 119/77, pulse 102, temperature 99.1 F (37.3 C), temperature source Oral, resp. rate 17, height 5' 0.5" (1.537 m), weight 83.462 kg (184 lb), last menstrual period 05/09/2008, SpO2 100.00%.Body mass index is 35.33 kg/(m^2).  General Appearance: Fairly Groomed  Engineer, water::  Good  Speech:  Normal Rate  Volume:  Normal  Mood:  Angry and Irritable  Affect:  Restricted and irritable   Thought Process:   Linear  Orientation:  Fully alert and attentive   Thought Content:  denies hallucinations, does not appear internally preoccupied at this time, appears guarded and paranoid, and denies or minimizes events as described in Initial Assessment  Suicidal Thoughts:  No- denies suicidal or homicidal ideations and contracts for safety on the unit   Homicidal Thoughts:  No  Memory:  recent and remote grossly intact   Judgement:  Fair  Insight:  Lacking  Psychomotor Activity:  Normal  Concentration:  Fair  Recall:  Good  Fund of Knowledge:Good  Language: Good  Akathisia:  Negative  Handed:  Right  AIMS (if indicated):     Assets:  Desire for Improvement Resilience  Sleep:  Number of Hours: 4.75   Musculoskeletal: Strength & Muscle Tone: within normal limits Gait & Station: normal Patient leans: N/A  Current Medications: Current Facility-Administered Medications  Medication Dose Route Frequency Provider Last Rate Last Dose  . acetaminophen (TYLENOL) tablet 650 mg  650 mg Oral Q6H PRN Clarene Reamer, MD   650 mg at 02/06/14 1656  . albuterol (PROVENTIL) (2.5 MG/3ML) 0.083% nebulizer solution 2.5 mg  2.5 mg Nebulization Q4H PRN Clarene Reamer, MD   2.5 mg at 02/07/14 1623  . amLODipine (NORVASC) tablet 10 mg  10 mg Oral QHS Ursula Alert, MD   10 mg at 02/06/14 2154  . divalproex (DEPAKOTE ER) 24 hr tablet 500 mg  500 mg Oral QHS Clarene Reamer, MD   500 mg at 02/06/14 2154  . doxepin (  SINEQUAN) capsule 50 mg  50 mg Oral QHS Clarene Reamer, MD      . feeding supplement (GLUCERNA SHAKE) (GLUCERNA SHAKE) liquid 237 mL  237 mL Oral BID BM Toribio Harbour, RD   237 mL at 02/07/14 1436  . furosemide (LASIX) tablet 120 mg  120 mg Oral BID Clarene Reamer, MD   120 mg at 02/07/14 1749  . hydrOXYzine (ATARAX/VISTARIL) tablet 12.5 mg  12.5 mg Oral Q6H PRN Clarene Reamer, MD      . magnesium hydroxide (MILK OF MAGNESIA) suspension 30 mL  30 mL Oral Daily PRN Clarene Reamer, MD   30 mL at  02/06/14 0626  . mometasone-formoterol (DULERA) 100-5 MCG/ACT inhaler 2 puff  2 puff Inhalation BID Clarene Reamer, MD   2 puff at 02/07/14 0847  . nicotine (NICODERM CQ - dosed in mg/24 hours) patch 21 mg  21 mg Transdermal Daily Ursula Alert, MD   21 mg at 02/07/14 1250  . simvastatin (ZOCOR) tablet 5 mg  5 mg Oral QHS Ursula Alert, MD   5 mg at 02/06/14 2154  . ziprasidone (GEODON) capsule 60 mg  60 mg Oral BID AC & HS Ursula Alert, MD   60 mg at 02/07/14 9326    Lab Results:  Results for orders placed during the hospital encounter of 02/05/14 (from the past 48 hour(s))  TSH     Status: None   Collection Time    02/06/14  6:35 AM      Result Value Ref Range   TSH 0.961  0.350 - 4.500 uIU/mL   Comment: Performed at [facility]  T3, FREE     Status: Abnormal   Collection Time    02/06/14  6:35 AM      Result Value Ref Range   T3, Free 2.2 (*) 2.3 - 4.2 pg/mL   Comment: Performed at [facility]  T4, FREE     Status: None   Collection Time    02/06/14  6:35 AM      Result Value Ref Range   Free T4 1.01  0.80 - 1.80 ng/dL   Comment: Performed at [place] A1C     Status: None   Collection Time    02/06/14  6:35 AM      Result Value Ref Range   Hemoglobin A1C 4.9  <5.7 %   Comment: (NOTE)                                                                               According to the ADA Clinical Practice Recommendations for 2011, when     HbA1c is used as a screening test:      >=6.5%   Diagnostic of Diabetes Mellitus               (if abnormal result is confirmed)     5.7-6.4%   Increased risk of developing Diabetes Mellitus     References:Diagnosis and Classification of Diabetes Mellitus,Diabetes     ZTIW,5809,98(PJASN 1):S62-S69 and Standards of Medical Care in             Diabetes - 2011,Diabetes KNLZ,7673,41 (Suppl  1):S11-S61.   Mean Plasma Glucose 94  <117 mg/dL   Comment: Performed at [place]      Status: Abnormal   Collection Time    02/06/14  6:35 AM      Result Value Ref Range   Cholesterol 227 (*) 0 - 200 mg/dL   Triglycerides 238 (*) <150 mg/dL   HDL 62  >39 mg/dL   Total CHOL/HDL Ratio 3.7     VLDL 48 (*) 0 - 40 mg/dL   LDL Cholesterol 117 (*) 0 - 99 mg/dL   Comment:            Total Cholesterol/HDL:CHD Risk     Coronary Heart Disease Risk Table                         Men   Women      1/2 Average Risk   3.4   3.3      Average Risk       5.0   4.4      2 X Average Risk   9.6   7.1      3 X Average Risk  23.4   11.0                Use the calculated Patient Ratio     above and the CHD Risk Table     to determine the patient's CHD Risk.                ATP III CLASSIFICATION (LDL):      <100     mg/dL   Optimal      100-129  mg/dL   Near or Above                        Optimal      130-159  mg/dL   Borderline      160-189  mg/dL   High      >190     mg/dL   Very High     Performed at [facility]  VITAMIN B12     Status: Abnormal   Collection Time    02/06/14  6:35 AM      Result Value Ref Range   Vitamin B-12 1065 (*) 211 - 911 pg/mL   Comment: Performed at [place]     Status: None   Collection Time    02/06/14  6:35 AM      Result Value Ref Range   Folate 7.5     Comment: (NOTE)     Reference Ranges            Deficient:       0.4 - 3.3 ng/mL            Indeterminate:   3.4 - 5.4 ng/mL            Normal:              > 5.4 ng/mL     Performed at [place], CAPILLARY     Status: None   Collection Time    02/06/14  4:33 PM      Result Value Ref Range   Glucose-Capillary 82  70 - 99 mg/dL   Comment 1 Documented in Chart     Comment 2 Notify RN    GLUCOSE, CAPILLARY     Status: None  Collection Time    02/07/14  6:16 AM      Result Value Ref Range   Glucose-Capillary 75  70 - 99 mg/dL    Physical Findings: AIMS: Facial and Oral Movements Muscles of Facial Expression: None, normal Lips and  Perioral Area: None, normal Jaw: None, normal Tongue: None, normal,Extremity Movements Upper (arms, wrists, hands, fingers): None, normal Lower (legs, knees, ankles, toes): None, normal, Trunk Movements Neck, shoulders, hips: None, normal, Overall Severity Severity of abnormal movements (highest score from questions above): None, normal Incapacitation due to abnormal movements: None, normal Patient's awareness of abnormal movements (rate only patient's report): No Awareness, Dental Status Current problems with teeth and/or dentures?: No Does patient usually wear dentures?: No  CIWA:    COWS:      Assessment: Patient remains irritable, angry, but not threatening or disruptive. Denies hallucinations- has been taking medications with staff encouragement. Limited insight.  Treatment Plan Summary: Daily contact with patient to assess and evaluate symptoms and progress in treatment Medication management See below   Plan: Continue inpatient treatment, support , milieu Depakote ER 500 mgrs QHS  Doxepin 50 mgrs QHS Geodon 60 mgrs BID     Medical Decision Making Problem Points:  Established problem, stable/improving (1), Review of last therapy session (1) and Review of psycho-social stressors (1) Data Points:  Review or order clinical lab tests (1) Review of medication regiment & side effects (2)  I certify that inpatient services furnished can reasonably be expected to improve the patient's condition.   Tinika Bucknam 02/07/2014, 6:22 PM

## 2014-02-07 NOTE — Progress Notes (Signed)
Patient ID: Jennifer Lawson, female   DOB: 10/11/1960, 53 y.o.   MRN: [id number] D: Patient repeatedly  asked writer if she will be getting a roommate. Pt reports unable to urinate since this morning and wanted to call her PCP. Pt paranoid and irritable  reports everyone is telling her something different about her care and needs to discharge. Pt unwilling to participate in group activities.  No acute distressed noted at this time.   A: Met with pt 1:1. Medications administered as prescribed. Pt educated about CKD. Writer encouraged pt to discuss feelings. Pt encouraged to come to staff with any question or concerns.   R: Patient remains safe. She is complaint with medications and denies any adverse reaction. Continue current POC.

## 2014-02-08 DIAGNOSIS — F068 Other specified mental disorders due to known physiological condition: Secondary | ICD-10-CM

## 2014-02-08 DIAGNOSIS — R34 Anuria and oliguria: Secondary | ICD-10-CM

## 2014-02-08 DIAGNOSIS — F1020 Alcohol dependence, uncomplicated: Secondary | ICD-10-CM

## 2014-02-08 DIAGNOSIS — F319 Bipolar disorder, unspecified: Secondary | ICD-10-CM

## 2014-02-08 DIAGNOSIS — J410 Simple chronic bronchitis: Secondary | ICD-10-CM

## 2014-02-08 DIAGNOSIS — N185 Chronic kidney disease, stage 5: Secondary | ICD-10-CM

## 2014-02-08 LAB — GLUCOSE, CAPILLARY: Glucose-Capillary: 73 mg/dL (ref 70–99)

## 2014-02-08 MED ORDER — ACETAMINOPHEN 325 MG PO TABS
650.0000 mg | ORAL_TABLET | Freq: Three times a day (TID) | ORAL | Status: DC | PRN
  Administered 2014-02-08 – 2014-02-11 (×5): 650 mg via ORAL
  Filled 2014-02-08 (×6): qty 2

## 2014-02-08 MED ORDER — METOPROLOL TARTRATE 25 MG PO TABS
25.0000 mg | ORAL_TABLET | Freq: Two times a day (BID) | ORAL | Status: DC
  Administered 2014-02-08 – 2014-02-12 (×8): 25 mg via ORAL
  Filled 2014-02-08 (×5): qty 1
  Filled 2014-02-08: qty 28
  Filled 2014-02-08 (×4): qty 1
  Filled 2014-02-08: qty 28
  Filled 2014-02-08 (×3): qty 1

## 2014-02-08 MED ORDER — FUROSEMIDE 80 MG PO TABS
80.0000 mg | ORAL_TABLET | Freq: Two times a day (BID) | ORAL | Status: DC
  Administered 2014-02-09 – 2014-02-12 (×7): 80 mg via ORAL
  Filled 2014-02-08 (×4): qty 1
  Filled 2014-02-08: qty 2
  Filled 2014-02-08 (×7): qty 1

## 2014-02-08 MED ORDER — SIMVASTATIN 10 MG PO TABS
10.0000 mg | ORAL_TABLET | Freq: Every day | ORAL | Status: DC
  Administered 2014-02-08 – 2014-02-11 (×4): 10 mg via ORAL
  Filled 2014-02-08 (×4): qty 1
  Filled 2014-02-08: qty 14
  Filled 2014-02-08 (×2): qty 1

## 2014-02-08 MED ORDER — ZIPRASIDONE HCL 60 MG PO CAPS
120.0000 mg | ORAL_CAPSULE | Freq: Every day | ORAL | Status: DC
  Administered 2014-02-09 – 2014-02-11 (×3): 120 mg via ORAL
  Filled 2014-02-08 (×2): qty 2
  Filled 2014-02-08: qty 28
  Filled 2014-02-08 (×3): qty 2

## 2014-02-08 NOTE — Progress Notes (Signed)
D: Pt has labile, irritable affect and mood.  Pt has denies SI/HI, denies hallucinations.  Pt states "I'm just trying to do what I need to do to get out of here."  Pt interacts with peers minimally and is demanding when interacting with staff.   A:  Medications administered per order.  Pt was very hesitant to take medications and said she did not need some of the medications initially. She was compliant with medications after much encouragement.  Encouraged pt to report needs or concerns to staff. R: Pt verbally contracts for safety and is resting in her room at this time.  Respirations are even, unlabored, WNL.  Will continue to monitor and assess for safety.

## 2014-02-08 NOTE — [facility] Group Notes (Signed)
[facility] Notes: (Clinical Social Work)   02/08/2014      Type of Therapy:  Group Therapy   Participation Level:  Did Not Attend - declined to leave her room   Selmer Dominion, LCSW 02/08/2014, 1:15 PM

## 2014-02-08 NOTE — Consult Note (Addendum)
Patient Demographics  Jennifer Lawson, is a 53 y.o. female   MRN: [id number]   DOB - Jun 28, 1960  Admit Date - 02/05/2014    Outpatient Primary MD for the patient is OSEI-BONSU,GEORGE, MD  Consult requested in the Hospital by Ursula Alert, MD, On 02/08/2014    Reason for consult  - "patient not making any urine"   With History of -  Past Medical History  Diagnosis Date  . Mental disorder   . Depression   . Hypertension   . Overdose   . Tobacco use disorder 11/13/2012  . Complication of anesthesia     difficulty going to sleep  . Chronic kidney disease     06/11/13- not on dialysis  . Shortness of breath     lying down flat  . PTSD (post-traumatic stress disorder)   . Asthma   . COPD (chronic obstructive pulmonary disease)   . Heart murmur   . GERD (gastroesophageal reflux disease)   . Seizures     "passsed out"  . History of blood transfusion   . Diabetes mellitus without complication     denies      Past Surgical History  Procedure Laterality Date  . Right knee replacement      she says it was last year.  . Esophagogastroduodenoscopy Left 11/14/2012    Procedure: ESOPHAGOGASTRODUODENOSCOPY (EGD);  Surgeon: [name], MD;  Location: [facility];  Service: Endoscopy;  Laterality: Left;  . Joint replacement Right     knee  . Parathyroidectomy    . Av fistula placement Right 06/12/2013    Procedure: ARTERIOVENOUS (AV) FISTULA CREATION; RIGHT  BASILIC VEIN TRANSPOSITION with Intraoperative ultrasound;  Surgeon: Mal [name], MD;  Location: [facility];  Service: Vascular;  Laterality: Right;    in for   No chief complaint on file.    HPI  Jennifer Lawson  is a 53 y.o. female,  history of PTSD, bipolar disorder, alcohol and smoking abuse counseled to quit both, essential hypertension, recent pneumonia causing  acute respiratory failure, chronic kidney disease stage V baseline creatinine around 4 to 4.5 on baseline on 120 mg of Lasix twice a day, was admitted to behavioral health hospital a few days ago for psychosis, depression and increased alcohol abuse.   I was called today to see the patient for "patient not making any urine" however when I asked the patient she said she making regular urine, but she had some burning this morning. She denies any headache at this time, no fever chills, no chest pain, no shortness of breath or abdominal pain, no focal weakness.   Review of Systems    In addition to the HPI above,   No Fever-chills, No Headache, No changes with Vision or hearing, No problems swallowing food or Liquids, No Chest pain, Cough or Shortness of Breath, No Abdominal pain, No Nausea or Vommitting, Bowel movements are regular, No Blood in stool or Urine, Mild dysuria this morning, No new skin rashes or bruises, No new joints  pains-aches, does have chronic joint pains and aches, No new weakness, tingling, numbness in any extremity, No recent weight gain or loss, No polyuria, polydypsia or polyphagia, No significant Mental Stressors.  A full 10 point Review of Systems was done, except as stated above, all other Review of Systems were negative.   Social History History  Substance Use Topics  . Smoking status: Current Every Day Smoker -- 2.00 packs/day for 40 years    Types: Cigarettes, Cigars  . Smokeless tobacco: Never Used  . Alcohol Use: No     Comment: none in over a month per daughter      Family History Family History  Problem Relation Age of Onset  . Diabetes Mother   . Hyperlipidemia Mother   . Hypertension Mother   . Diabetes Father   . Hypertension Father   . Hyperlipidemia Father       Prior to Admission medications   Medication Sig Start Date End Date Taking? Authorizing Provider  albuterol (PROVENTIL) (2.5 MG/3ML) 0.083% nebulizer solution Take 3 mLs  (2.5 mg total) by nebulization every 4 (four) hours as needed for wheezing or shortness of breath. 01/22/14   Shanker Kristeen Mans, MD  amLODipine (NORVASC) 10 MG tablet Take 1 tablet (10 mg total) by mouth daily. 01/22/14   Shanker Kristeen Mans, MD  amoxicillin-clavulanate (AUGMENTIN) 500-125 MG per tablet Take 1 tablet (500 mg total) by mouth daily. 01/22/14   Shanker Kristeen Mans, MD  Diphenhydramine-APAP, sleep, (TYLENOL PM EXTRA STRENGTH PO) Take 1 tablet by mouth at bedtime.    Historical Provider, MD  divalproex (DEPAKOTE ER) 500 MG 24 hr tablet Take 500 mg by mouth at bedtime. 01/22/14   Shanker Kristeen Mans, MD  Fluticasone-Salmeterol (ADVAIR) 100-50 MCG/DOSE AEPB Inhale 1 puff into the lungs 2 (two) times daily. 01/22/14   Shanker Kristeen Mans, MD  furosemide (LASIX) 40 MG tablet Take 3 tablets (120 mg total) by mouth 2 (two) times daily. 01/22/14   Shanker Kristeen Mans, MD  ziprasidone (GEODON) 60 MG capsule Take 1 capsule (60 mg total) by mouth 2 (two) times daily with a meal. 01/22/14   Jonetta Osgood, MD    Anti-infectives   None      Scheduled Meds: . divalproex  500 mg Oral QHS  . doxepin  50 mg Oral QHS  . feeding supplement (GLUCERNA SHAKE)  237 mL Oral BID BM  . [START ON 02/09/2014] furosemide  80 mg Oral BID  . metoprolol tartrate  25 mg Oral BID  . mometasone-formoterol  2 puff Inhalation BID  . nicotine  21 mg Transdermal Daily  . simvastatin  10 mg Oral QHS  . [START ON 02/09/2014] ziprasidone  120 mg Oral QHS   Continuous Infusions:  PRN Meds:.acetaminophen, albuterol, magnesium hydroxide  Allergies  Allergen Reactions  . Codeine Sulfate Anaphylaxis    Daughter called about having this allergy   . Haldol [Haloperidol Lactate] Shortness Of Breath  . Risperidone And Related Shortness Of Breath  . Gabapentin Other (See Comments)    seizures  . Trazodone And Nefazodone Other (See Comments)    Makes pt loose balance and fall    Physical Exam  Vitals  Blood pressure 110/80,  pulse 87 (repeat), temperature 98.8 F (37.1 C), temperature source Oral, resp. rate 18, height 5' 0.5" (1.537 m), weight 83.462 kg (184 lb), last menstrual period 05/09/2008, SpO2 100.00%.   1. General middle aged [place] female lying in bed in NAD,     2. Anxious  affect and insight, Not Suicidal or Homicidal, Awake Alert, Oriented X 3.  3. No F.N deficits, ALL C.Nerves Intact, Strength 5/5 all 4 extremities, Sensation intact all 4 extremities, Plantars down going.  4. Ears and Eyes appear Normal, Conjunctivae clear, PERRLA. Moist Oral Mucosa.  5. Supple Neck, No JVD, No cervical lymphadenopathy appriciated, No Carotid Bruits.  6. Symmetrical Chest wall movement, Good air movement bilaterally, CTAB.  7. RRR, No Gallops, Rubs or Murmurs, No Parasternal Heave.  8. Positive Bowel Sounds, Abdomen Soft, No tenderness, No organomegaly appriciated,No rebound -guarding or rigidity.  9.  No Cyanosis, Normal Skin Turgor, No Skin Rash or Bruise, trace to no edema  10. Good muscle tone,  joints appear normal , no effusions, Normal ROM.  11. No Palpable Lymph Nodes in Neck or Axillae     Data Review  CBC  Recent Labs Lab 02/04/14 1609  WBC 5.1  HGB 7.6*  HCT 25.0*  PLT 342  MCV 100.8*  MCH 30.6  MCHC 30.4  RDW 17.8*  LYMPHSABS 1.7  MONOABS 0.4  EOSABS 0.2  BASOSABS 0.0   ------------------------------------------------------------------------------------------------------------------  Chemistries   Recent Labs Lab 02/04/14 1609  NA 139  K 4.0  CL 101  CO2 26  GLUCOSE 135*  BUN 49*  CREATININE 4.50*  CALCIUM 10.4  AST 10  ALT 6  ALKPHOS 50  BILITOT <0.2*   ------------------------------------------------------------------------------------------------------------------ estimated creatinine clearance is 13.9 ml/min (by C-G formula based on Cr of  4.5). ------------------------------------------------------------------------------------------------------------------  Recent Labs  02/06/14 0635  TSH 0.961  T3FREE 2.2*     Coagulation profile No results found for this basename: INR, PROTIME,  in the last 168 hours ------------------------------------------------------------------------------------------------------------------- No results found for this basename: DDIMER,  in the last 72 hours -------------------------------------------------------------------------------------------------------------------  Cardiac Enzymes No results found for this basename: CK, CKMB, TROPONINI, MYOGLOBIN,  in the last 168 hours ------------------------------------------------------------------------------------------------------------------ No components found with this basename: POCBNP,    ---------------------------------------------------------------------------------------------------------------  Urinalysis    Component Value Date/Time   COLORURINE YELLOW 02/04/2014 2052   APPEARANCEUR CLEAR 02/04/2014 2052   LABSPEC 1.007 02/04/2014 2052   PHURINE 7.0 02/04/2014 2052   GLUCOSEU NEGATIVE 02/04/2014 2052   HGBUR NEGATIVE 02/04/2014 2052   BILIRUBINUR NEGATIVE 02/04/2014 2052   KETONESUR NEGATIVE 02/04/2014 2052   PROTEINUR 100* 02/04/2014 2052   UROBILINOGEN 0.2 02/04/2014 2052   NITRITE NEGATIVE 02/04/2014 2052   LEUKOCYTESUR NEGATIVE 02/04/2014 2052     Imaging results:   No results found.       Assessment & Plan   1. Chronic kidney disease stage V - baseline creatinine is anywhere between 4-4.5, she is on high doses of oral Lasix which is 120 mg twice a day, she has no edema on exam, no suprapubic fullness. She claims that she is making urine 3-4 times a day, during said that her blood pressures are running slightly low. At this time I will drop her Lasix dose to 80 mg twice a day from 120, will order a CBC, BMP, UA with culture  sensitivity, also a renal ultrasound to rule out possibility of any obstruction of hydronephrosis. We will monitor her lab results tomorrow. We'll request psych team check BMP once a week and call us if her creatinine is higher than her baseline of 4.5 or she starts to develop edema.   Post discharged and follow with nephrologist Dr. Lorrene Reid.    2. Essential hypertension. Currently on Norvasc, I will stop Norvasc as this can cause edema, will switch her to  low-dose beta blocker which will help her with her anxiety as well.    3. Recent history of pneumonia. Once discharged must follow with PCP to get CBC and a 2 view chest x-ray rechecked.    4. Tobacco and alcohol abuse. Counseled to quit both. We'll defer treatment to primary team.    5. Psychosis, PTSD, bipolar disorder. Defer to psych team.    6.AOCD - check An panel, goal keep hemoglobin above 7.     Have ordered CBC, BMP, UA , An Panel, and renal ultrasound. Please review results call us with any questions.    Family Communication: Plan discussed with patient .   Thank you for the consult, we will follow the patient with you tomorrow.   Thurnell Lose M.D on 02/08/2014 at 5:21 PM  Between 7am to 7pm - Pager - [phone]  After 7pm go to [email or web address] - password [email or web address]  And look for the night coverage person covering me after hours   Thank you for the consult, we will follow the patient with you in the [facility] Office  [phone]   **Disclaimer: This note may have been dictated with voice recognition software. Similar sounding words can inadvertently be transcribed and this note may contain transcription errors which may not have been corrected upon publication of note.**

## 2014-02-08 NOTE — Progress Notes (Signed)
[facility] Group Notes:  (Nursing/MHT/Case Management/Adjunct)  Date:  02/08/2014  Time:  9:31 PM  Type of Therapy:  Psychoeducational Skills  Participation Level:  Active  Participation Quality:  Monopolizing  Affect:  Depressed and Flat  Cognitive:  Disorganized and Lacking  Insight:  Lacking  Engagement in Group:  Monopolizing and Off Topic  Modes of Intervention:  Education  Summary of Progress/Problems: The patient initially mentioned that she had a bad day overall and was hesitant to explain herself. The patient stared at the floor for periods of time and would not say anything. The patient eventually began to speak up and talked about how she was upset about how her medication was dispensed at the wrong time(s). She feels that her medication was given to her too frequently and at the wrong time of day. In addition, she stated that she "wants to go home" and that she "doesn't feel safe". She explained that she was unhappy about her roommate being awake all of last evening. In terms of the theme for the day, her support system will be her family.   Archie Balboa S 02/08/2014, 9:31 PM

## 2014-02-08 NOTE — Progress Notes (Signed)
Patient ID: Jennifer Lawson, female   DOB: 05-24-1960, 53 y.o.   MRN: [id number] [facility] MD Progress Note  02/08/2014 11:21 AM [name]  MRN:  [id number] Subjective:  " I just want to go home soon.'' Objective: Patient seen and chart reviewed. She has no insight into her mental illness and thinks she is put in the hospital just to "punish". Patient's daughter reports that she has been exercising poor judgment, smoking cigarette and drinking beer in the presence of her daughter's little boy. Patient has also been showing delusional symptoms as evidenced by making multiple calls to the police making unfounded allegations against her daughter. Today, she remains delusional and irritable.   Diagnosis:  Bipolar Disorder with psychotic symptoms  Total Time spent with patient: 25 minutes     ADL's:  fair  Sleep: fair   Appetite:  Fair   Suicidal Ideation:  Denies SI  Homicidal Ideation:  Denies HI  AEB (as evidenced by):  Psychiatric Specialty Exam: Physical Exam  Review of Systems  Constitutional: Negative for fever and chills.  Respiratory: Negative for cough and shortness of breath.   Cardiovascular: Negative for chest pain.  Musculoskeletal: Positive for back pain.  Skin: Negative for rash.  Psychiatric/Behavioral: Positive for depression.       Guarded, irritable    Blood pressure 104/75, pulse 115, temperature 98.8 F (37.1 C), temperature source Oral, resp. rate 18, height 5' 0.5" (1.537 m), weight 83.462 kg (184 lb), last menstrual period 05/09/2008, SpO2 100.00%.Body mass index is 35.33 kg/(m^2).  General Appearance: Fairly Groomed  Engineer, water::  Good  Speech:  Normal Rate  Volume:  Normal  Mood:  Angry and Irritable  Affect:  Restricted and irritable   Thought Process:  Linear  Orientation:  Fully alert and attentive   Thought Content:  denies hallucinations, does not appear internally preoccupied at this time, appears guarded and paranoid, and denies or  minimizes events as described in Initial Assessment  Suicidal Thoughts:  No- denies suicidal or homicidal ideations and contracts for safety on the unit   Homicidal Thoughts:  No  Memory:  recent and remote grossly intact   Judgement:  Fair  Insight:  Lacking  Psychomotor Activity:  Normal  Concentration:  Fair  Recall:  Good  Fund of Knowledge:Good  Language: Good  Akathisia:  Negative  Handed:  Right  AIMS (if indicated):     Assets:  Desire for Improvement Resilience  Sleep:  Number of Hours: 4   Musculoskeletal: Strength & Muscle Tone: within normal limits Gait & Station: normal Patient leans: N/A  Current Medications: Current Facility-Administered Medications  Medication Dose Route Frequency Provider Last Rate Last Dose  . acetaminophen (TYLENOL) tablet 650 mg  650 mg Oral Q6H PRN Clarene Reamer, MD   650 mg at 02/06/14 1656  . albuterol (PROVENTIL) (2.5 MG/3ML) 0.083% nebulizer solution 2.5 mg  2.5 mg Nebulization Q4H PRN Clarene Reamer, MD   2.5 mg at 02/07/14 2311  . amLODipine (NORVASC) tablet 10 mg  10 mg Oral QHS Ursula Alert, MD   10 mg at 02/07/14 2108  . divalproex (DEPAKOTE ER) 24 hr tablet 500 mg  500 mg Oral QHS Clarene Reamer, MD   500 mg at 02/07/14 2107  . doxepin (SINEQUAN) capsule 50 mg  50 mg Oral QHS Clarene Reamer, MD   50 mg at 02/07/14 2109  . feeding supplement (GLUCERNA SHAKE) (GLUCERNA SHAKE) liquid 237 mL  237 mL Oral BID BM  Toribio Harbour, RD   237 mL at 02/07/14 2034  . furosemide (LASIX) tablet 120 mg  120 mg Oral BID Clarene Reamer, MD   120 mg at 02/08/14 4098  . hydrOXYzine (ATARAX/VISTARIL) tablet 12.5 mg  12.5 mg Oral Q6H PRN Clarene Reamer, MD      . magnesium hydroxide (MILK OF MAGNESIA) suspension 30 mL  30 mL Oral Daily PRN Clarene Reamer, MD   30 mL at 02/06/14 0626  . mometasone-formoterol (DULERA) 100-5 MCG/ACT inhaler 2 puff  2 puff Inhalation BID Clarene Reamer, MD   2 puff at 02/08/14 0930  . nicotine (NICODERM CQ -  dosed in mg/24 hours) patch 21 mg  21 mg Transdermal Daily Ursula Alert, MD   21 mg at 02/08/14 0931  . simvastatin (ZOCOR) tablet 10 mg  10 mg Oral QHS Mariafernanda Hendricksen      . [START ON 02/09/2014] ziprasidone (GEODON) capsule 120 mg  120 mg Oral QHS Mckinzy Fuller        Lab Results:  Results for orders placed during the hospital encounter of 02/05/14 (from the past 48 hour(s))  GLUCOSE, CAPILLARY     Status: None   Collection Time    02/06/14  4:33 PM      Result Value Ref Range   Glucose-Capillary 82  70 - 99 mg/dL   Comment 1 Documented in Chart     Comment 2 Notify RN    GLUCOSE, CAPILLARY     Status: None   Collection Time    02/07/14  6:16 AM      Result Value Ref Range   Glucose-Capillary 75  70 - 99 mg/dL  GLUCOSE, CAPILLARY     Status: None   Collection Time    02/08/14  6:03 AM      Result Value Ref Range   Glucose-Capillary 73  70 - 99 mg/dL    Physical Findings: AIMS: Facial and Oral Movements Muscles of Facial Expression: None, normal Lips and Perioral Area: None, normal Jaw: None, normal Tongue: None, normal,Extremity Movements Upper (arms, wrists, hands, fingers): None, normal Lower (legs, knees, ankles, toes): None, normal, Trunk Movements Neck, shoulders, hips: None, normal, Overall Severity Severity of abnormal movements (highest score from questions above): None, normal Incapacitation due to abnormal movements: None, normal Patient's awareness of abnormal movements (rate only patient's report): No Awareness, Dental Status Current problems with teeth and/or dentures?: No Does patient usually wear dentures?: No  CIWA:    COWS:      Assessment: Patient remains irritable, angry, but not threatening or disruptive. Denies hallucinations- has been taking medications with staff encouragement. Limited insight.  Treatment Plan Summary: Daily contact with patient to assess and evaluate symptoms and progress in treatment Medication management See below    Plan: Continue inpatient treatment, support , milieu Depakote ER 500 mgrs QHS  Doxepin 50 mgrs QHS Change Geodon to 120mg  daily at bedtime for mood stabilization.     Medical Decision Making Problem Points:  Established problem, improving (1), Review of last therapy session (1) and Review of psycho-social stressors (1) Data Points:  Review or order clinical lab tests (1) Review of medication regiment & side effects (2)  I certify that inpatient services furnished can reasonably be expected to improve the patient's condition.   Corena Pilgrim, MD 02/08/2014, 11:21 AM

## 2014-02-08 NOTE — Progress Notes (Signed)
Patient ID: TURA ROLLER, female   DOB: 08-04-1960, 53 y.o.   MRN: [id number] Psychoeducational Group Note  Date:  02/08/2014 Time:  1000  Group Topic/Focus:  inventory group   Participation Level: Did Not Attend  Participation Quality:  Not Applicable  Affect:  Not Applicable  Cognitive:  Not Applicable  Insight:  Not Applicable  Engagement in Group: Not Applicable  Additional Comments:  Did not attend- pt is psychotic and irritable.   Pricilla Larsson 02/08/2014, 11:43 AM

## 2014-02-08 NOTE — Progress Notes (Signed)
Patient ID: Jennifer Lawson, female   DOB: [birth year], 53 y.o.   MRN: [id number] Nursing shift note: D: pt is uncooperative, is not participating, not going to group and took her am medications with a great deal of resistant. A: she eventually took her medications and when speaking to the RN she stated " will I be leaving today" . RN responded "no". She did ask that her Geodon be scheduled for night time. RN solicited MD to change her the  time for the Geodon to be given at night. R: this pt remains irritable and goes to her room and gets in the bed. RN will monitor and Q 15 min ck's continue.

## 2014-02-08 NOTE — Progress Notes (Signed)
Patient ID: Jennifer Lawson, female   DOB: June 01, 1960, 53 y.o.   MRN: [id number] Psychoeducational Group Note  Date:  02/08/2014 Time:  1030  Group Topic/Focus:  healthy coping skills.   Participation Level: Did Not Attend  Participation Quality:  Not Applicable  Affect:  Not Applicable  Cognitive:  Not Applicable  Insight:  Not Applicable  Engagement in Group: Not Applicable  Additional Comments:  Did not attend; pt is psychotic and irritable.   Pricilla Larsson 02/08/2014, 11:44 AM

## 2014-02-09 ENCOUNTER — Inpatient Hospital Stay (HOSPITAL_COMMUNITY): Payer: Medicare Other

## 2014-02-09 DIAGNOSIS — F3160 Bipolar disorder, current episode mixed, unspecified: Secondary | ICD-10-CM

## 2014-02-09 DIAGNOSIS — F29 Unspecified psychosis not due to a substance or known physiological condition: Secondary | ICD-10-CM

## 2014-02-09 DIAGNOSIS — N184 Chronic kidney disease, stage 4 (severe): Secondary | ICD-10-CM

## 2014-02-09 LAB — CBC
HCT: 27 % — ABNORMAL LOW (ref 36.0–46.0)
Hemoglobin: 8.1 g/dL — ABNORMAL LOW (ref 12.0–15.0)
MCH: 30.3 pg (ref 26.0–34.0)
MCHC: 30 g/dL (ref 30.0–36.0)
MCV: 101.1 fL — ABNORMAL HIGH (ref 78.0–100.0)
Platelets: 298 10*3/uL (ref 150–400)
RBC: 2.67 MIL/uL — ABNORMAL LOW (ref 3.87–5.11)
RDW: 16.8 % — ABNORMAL HIGH (ref 11.5–15.5)
WBC: 4.2 10*3/uL (ref 4.0–10.5)

## 2014-02-09 LAB — RETICULOCYTES
RBC.: 2.67 MIL/uL — ABNORMAL LOW (ref 3.87–5.11)
Retic Count, Absolute: 112.1 10*3/uL (ref 19.0–186.0)
Retic Ct Pct: 4.2 % — ABNORMAL HIGH (ref 0.4–3.1)

## 2014-02-09 LAB — VITAMIN B12: Vitamin B-12: 996 pg/mL — ABNORMAL HIGH (ref 211–911)

## 2014-02-09 LAB — URINE MICROSCOPIC-ADD ON

## 2014-02-09 LAB — URINALYSIS, ROUTINE W REFLEX MICROSCOPIC
Bilirubin Urine: NEGATIVE
Glucose, UA: NEGATIVE mg/dL
Hgb urine dipstick: NEGATIVE
Ketones, ur: NEGATIVE mg/dL
Leukocytes, UA: NEGATIVE
Nitrite: NEGATIVE
Protein, ur: 100 mg/dL — AB
Specific Gravity, Urine: 1.006 (ref 1.005–1.030)
Urobilinogen, UA: 0.2 mg/dL (ref 0.0–1.0)
pH: 7 (ref 5.0–8.0)

## 2014-02-09 LAB — BASIC METABOLIC PANEL
Anion gap: 11 (ref 5–15)
BUN: 40 mg/dL — ABNORMAL HIGH (ref 6–23)
CO2: 26 mEq/L (ref 19–32)
Calcium: 10 mg/dL (ref 8.4–10.5)
Chloride: 103 mEq/L (ref 96–112)
Creatinine, Ser: 4.42 mg/dL — ABNORMAL HIGH (ref 0.50–1.10)
GFR calc Af Amer: 12 mL/min — ABNORMAL LOW (ref >90)
GFR calc non Af Amer: 10 mL/min — ABNORMAL LOW (ref >90)
Glucose, Bld: 89 mg/dL (ref 70–99)
Potassium: 4.5 mEq/L (ref 3.7–5.3)
Sodium: 140 mEq/L (ref 137–147)

## 2014-02-09 LAB — IRON AND TIBC
Iron: 38 ug/dL — ABNORMAL LOW (ref 42–135)
Saturation Ratios: 15 % — ABNORMAL LOW (ref 20–55)
TIBC: 258 ug/dL (ref 250–470)
UIBC: 220 ug/dL (ref 125–400)

## 2014-02-09 LAB — GLUCOSE, CAPILLARY: Glucose-Capillary: 88 mg/dL (ref 70–99)

## 2014-02-09 LAB — FERRITIN: Ferritin: 102 ng/mL (ref 10–291)

## 2014-02-09 LAB — FOLATE: Folate: 11.8 ng/mL

## 2014-02-09 MED ORDER — LORAZEPAM 2 MG/ML IJ SOLN: 2.0000 mg | Freq: Once | INTRAMUSCULAR | Status: DC

## 2014-02-09 MED ORDER — ZIPRASIDONE MESYLATE 20 MG IM SOLR: 10.0000 mg | Freq: Once | INTRAMUSCULAR | Status: DC

## 2014-02-09 MED ORDER — LORAZEPAM 2 MG/ML IJ SOLN
2.0000 mg | Freq: Once | INTRAMUSCULAR | Status: AC | PRN
  Administered 2014-02-09: 2 mg via INTRAMUSCULAR
  Filled 2014-02-09: qty 1

## 2014-02-09 MED ORDER — ZIPRASIDONE MESYLATE 20 MG IM SOLR
10.0000 mg | Freq: Once | INTRAMUSCULAR | Status: AC | PRN
  Administered 2014-02-09: 10 mg via INTRAMUSCULAR
  Filled 2014-02-09: qty 20

## 2014-02-09 NOTE — Progress Notes (Signed)
Adult Psychoeducational Group Note  Date:  02/09/2014 Time:  9:39 PM  Group Topic/Focus:  Wrap-Up Group:   The focus of this group is to help patients review their daily goal of treatment and discuss progress on daily workbooks.  Participation Level:  Active  Participation Quality:  Appropriate  Affect:  Appropriate  Cognitive:  Appropriate  Insight: Appropriate  Engagement in Group:  Engaged  Modes of Intervention:  Education  Additional Comments:  The patient expressed that she has been resting most of the day.The patient also said that the medications really help her today.  Nash Shearer 02/09/2014, 9:39 PM

## 2014-02-09 NOTE — Progress Notes (Signed)
Patient ID: LESLIEANN WHISMAN, female   DOB: [birth year], 53 y.o.   MRN: [id number] Psychoeducational Group Note  Date:  02/09/2014 Time:  1030am  Group Topic/Focus:  Making Healthy Choices:   The focus of this group is to help patients identify negative/unhealthy choices they were using prior to admission and identify positive/healthier coping strategies to replace them upon discharge.  Participation Level:  Did Not Attend  Participation Quality:    Affect:  Cognitive:  Insight:   Engagement in Group:   Additional Comments:  Pt did not attend- [facility] was [facility] having [facility]; healthy support systems.  Pricilla Larsson 02/09/2014,3:19 PM

## 2014-02-09 NOTE — Progress Notes (Signed)
Patient ID: Jennifer Lawson, female   DOB: 1960-06-02, 53 y.o.   MRN: [id number] Nursing shift note: transporter marvin brought pt back to the 400 hall at 1020 am. He stated she was fearful while having her Ultrasound done, but got thru the procedure. She is in her room resting presently.

## 2014-02-09 NOTE — Progress Notes (Signed)
Patient ID: Jennifer Lawson, female   DOB: 01/18/1961, 53 y.o.   MRN: [id number] Nursing shift note: d: [facility].A: rn called the non emergent 911 at 0830 to transport the pt, since she is involuntary. Non emergent 911 got her at 0920 and the pt was transported. Report was given to marvin with 911. Prior to leaving the pt complained of some back pain she rated at 5/10 aching, as well as her scheduled meds this am. R: pt denied any si/hi and then she was transported to [facility] with 911 and she had a MHT to accompany her.

## 2014-02-09 NOTE — Progress Notes (Signed)
Patient Demographics  Jennifer Lawson, is a 53 y.o. female, DOB - 30-Oct-1960, JEH:[id number]  Admit date - 02/05/2014   Admitting Physician Ursula Alert, MD  Outpatient Primary MD for the patient is OSEI-BONSU,GEORGE, MD  LOS - 4   No chief complaint on file.       Subjective:   [name] today has, No headache, No chest pain, No abdominal pain - No Nausea, No new weakness tingling or numbness, No Cough - SOB. Denies any urinary complaints, reports she has been urinating with no problems.  Assessment & Plan    Principal Problem:   Anuria Active Problems:   CKD (chronic kidney disease)   HTN (hypertension)   Uncontrolled hypertension   Tobacco use disorder   COPD (chronic obstructive pulmonary disease)   Bipolar 1 disorder   Chronic kidney disease stage V  - Appears to be at her baseline. -Renal ultrasound showed no acute finding -Continue with Lasix 80 mg oral 2 times a day -Continue to follow with nephrology as an outpatient.  Essential hypertension -Norvasc was changed to metoprolol. -Acceptable, continue to monitor   Recent history of pneumonia. -Once discharged must follow with PCP to get CBC and a 2 view chest x-ray rechecked.   Anemia -Stable, at her baseline, is most likely in the setting of anemia of chronic disease/chronic kidney disease, appears to be macrocyte take, and B12 level still pending, as well rest of anemia panel still pending, will continue to follow.  Tobacco and alcohol abuse.  Counseled to quit both. We'll defer treatment to primary team.    Psychosis, PTSD, bipolar disorder.  Defer to psych team.            Scheduled Meds: . divalproex  500 mg Oral QHS  . doxepin  50 mg Oral QHS  . feeding supplement (GLUCERNA SHAKE)  237 mL Oral BID BM  . furosemide  80 mg Oral BID  . metoprolol tartrate  25  mg Oral BID  . mometasone-formoterol  2 puff Inhalation BID  . nicotine  21 mg Transdermal Daily  . simvastatin  10 mg Oral QHS  . ziprasidone  120 mg Oral QHS   Continuous Infusions:  PRN Meds:.acetaminophen, albuterol, magnesium hydroxide    Lab Results  Component Value Date   PLT 298 02/09/2014      Anti-infectives   None          Objective:   Filed Vitals:   02/08/14 1723 02/08/14 2050 02/09/14 0836 02/09/14 0837  BP: 121/83 124/91 103/65 104/66  Pulse: 87 87 79 87  Temp:  97.8 F (36.6 C) 97.4 F (36.3 C)   TempSrc:  Oral Oral   Resp: 18 16 18    Height:      Weight:      SpO2:        Wt Readings from Last 3 Encounters:  02/05/14 83.462 kg (184 lb)  01/20/14 87.3 kg (192 lb 7.4 oz)  09/24/13 88.451 kg (195 lb)    No intake or output data in the 24 hours ending 02/09/14 1451   Physical Exam  Awake Alert, Oriented X 3, No new F.N deficits, Normal affect [place].AT,PERRAL Supple Neck,No JVD, No cervical lymphadenopathy appriciated.  Symmetrical Chest wall movement, Good  air movement bilaterally, CTAB RRR,No Gallops,Rubs or new Murmurs, No Parasternal Heave +ve B.Sounds, Abd Soft, No tenderness, No organomegaly appriciated, No rebound - guarding or rigidity. No Cyanosis, Clubbing or edema, No new Rash or bruise     Data Review   Micro Results No results found for this or any previous visit (from the past 240 hour(s)).  Radiology Reports US Renal  02/09/2014   CLINICAL DATA:  Renal failure. History of hypertension, diabetes. Initial encounter.  EXAM: RENAL/URINARY TRACT ULTRASOUND COMPLETE  COMPARISON:  Abdominal ultrasound - 01/20/2013; CT abdomen pelvis -11/13/2012  FINDINGS: Right Kidney:  The right kidney appears slightly diminutive in size measure 9.7 cm in length. There is diffuse increased echogenicity of the renal parenchyma compatible with provided history of medical renal disease. Note is made of an approximately 1.9 x 1.9 x 1.9 cm partially  exophytic anechoic cyst arising from the inferior pole the right kidney. No focal renal lesions. No echogenic renal stones. No urinary obstruction.  Left Kidney:  The left kidney appears slightly diminutive in size measuring 9.2 cm in length. There is diffuse increased echogenicity of the renal parenchyma compatible with provided history of medical renal disease. Note is made of an approximately 1.0 x 1.4 x 0.9 cm anechoic partially exophytic cyst arising from the mid aspect of the left kidney. No focal renal lesions. No echogenic renal stones. No urinary obstruction.  Bladder:  Appears normal for degree of bladder distention.  Multiple echogenic shadowing stones are incidentally noted within otherwise normal-appearing gallbladder.  IMPRESSION: 1. Atrophy and diffuse marked increased echogenicity of the renal parenchyma compatible with provided history of medical renal disease. No evidence urinary obstruction. 2. Incidentally noted bilateral renal cysts. 3. Cholelithiasis without evidence of cholecystitis.   Electronically Signed   By: Sandi Mariscal M.D.   On: 02/09/2014 10:37    CBC  Recent Labs Lab 02/04/14 1609 02/09/14 0626  WBC 5.1 4.2  HGB 7.6* 8.1*  HCT 25.0* 27.0*  PLT 342 298  MCV 100.8* 101.1*  MCH 30.6 30.3  MCHC 30.4 30.0  RDW 17.8* 16.8*  LYMPHSABS 1.7  --   MONOABS 0.4  --   EOSABS 0.2  --   BASOSABS 0.0  --     Chemistries   Recent Labs Lab 02/04/14 1609 02/09/14 0626  NA 139 140  K 4.0 4.5  CL 101 103  CO2 26 26  GLUCOSE 135* 89  BUN 49* 40*  CREATININE 4.50* 4.42*  CALCIUM 10.4 10.0  AST 10  --   ALT 6  --   ALKPHOS 50  --   BILITOT <0.2*  --    ------------------------------------------------------------------------------------------------------------------ estimated creatinine clearance is 14.1 ml/min (by C-G formula based on Cr of 4.42). ------------------------------------------------------------------------------------------------------------------ No  results found for this basename: HGBA1C,  in the last 72 hours ------------------------------------------------------------------------------------------------------------------ No results found for this basename: CHOL, HDL, LDLCALC, TRIG, CHOLHDL, LDLDIRECT,  in the last 72 hours ------------------------------------------------------------------------------------------------------------------ No results found for this basename: TSH, T4TOTAL, FREET3, T3FREE, THYROIDAB,  in the last 72 hours ------------------------------------------------------------------------------------------------------------------  Recent Labs  02/09/14 0626  TIBC 258  IRON 38*  RETICCTPCT 4.2*    Coagulation profile No results found for this basename: INR, PROTIME,  in the last 168 hours  No results found for this basename: DDIMER,  in the last 72 hours  Cardiac Enzymes No results found for this basename: CK, CKMB, TROPONINI, MYOGLOBIN,  in the last 168 hours ------------------------------------------------------------------------------------------------------------------ No components found with this basename: POCBNP,  Time Spent in minutes   25 minute   Taryne Kiger M.D on 02/09/2014 at 2:51 PM  Between 7am to 7pm - Pager - [phone]  After 7pm go to [email or web address] - password [email or web address]  And look for the night coverage person covering for me after hours  [facility] Office  [phone]   **Disclaimer: This note may have been dictated with voice recognition software. Similar sounding words can inadvertently be transcribed and this note may contain transcription errors which may not have been corrected upon publication of note.**

## 2014-02-09 NOTE — Progress Notes (Signed)
Patient ID: Jennifer Lawson, female   DOB: 1960/08/29, 53 y.o.   MRN: [id number] Psychoeducational Group Note  Date:  02/09/2014 Time:  1015am  Group Topic/Focus:  Making Healthy Choices:   The focus of this group is to help patients identify negative/unhealthy choices they were using prior to admission and identify positive/healthier coping strategies to replace them upon discharge.  Participation Level:  Did Not Attend  Participation Quality:  Affect: Cognitive:  Insight:  Engagement in Group:  Additional Comments:  Inventory group- [facility].   Pricilla Larsson 02/09/2014,3:18 PM

## 2014-02-09 NOTE — Progress Notes (Deleted)
Patient ID: Jennifer Lawson, female   DOB: 07-30-1960, 53 y.o.   MRN: [id number] Psychoeducational Group Note  Date:  02/09/2014 Time:  1015am  Group Topic/Focus:  Making Healthy Choices:   The focus of this group is to help patients identify negative/unhealthy choices they were using prior to admission and identify positive/healthier coping strategies to replace them upon discharge.  Participation Level:  Active  Participation Quality:  Appropriate  Affect:  Blunted  Cognitive:  Appropriate  Insight:  Supportive  Engagement in Group:  Supportive  Additional Comments:  Inventory group    Pricilla Larsson 02/09/2014,3:15 PM

## 2014-02-09 NOTE — Progress Notes (Signed)
Patient ID: Jennifer Lawson, female   DOB: 10/23/1960, 53 y.o.   MRN: [id number] [facility] MD Progress Note  02/09/2014 4:23 PM Jennifer Lawson  MRN:  [id number] Subjective:  " When  Am I  Going home?.'' Objective:   Patient was seen after she came back from the ER for Ultrasound of her kidneys.  Patient lack knowledge as to why she is in the mental health hospital.  Patient states that her daughters are the reasons why she is in the hospital.  She then stated that she came to the hospital to make friends.  She states she has been isolated in the house baby sitting her grand children.    She voices some delusional thinking saying that her daughters do not appreciate what she does for them.   She accused staff of preventing her from receiving her calls.  Patient denies SI/HI/AVH  and asked this writer why she will like to kill herself.  She will be kept for few more days to monitor progress.  We will continue to provide safety.  Diagnosis:  Bipolar Disorder with psychotic symptoms  Total Time spent with patient: 25 minutes   ADL's:  fair  Sleep: fair   Appetite:  Fair   Suicidal Ideation:  Denies SI  Homicidal Ideation:  Denies HI  AEB (as evidenced by):  Psychiatric Specialty Exam: Physical Exam  Review of Systems  Constitutional: Negative for fever and chills.  Respiratory: Negative for cough and shortness of breath.   Cardiovascular: Negative for chest pain.  Musculoskeletal: Positive for back pain.  Skin: Negative for rash.  Psychiatric/Behavioral: Positive for depression.       Guarded, irritable    Blood pressure 104/66, pulse 87, temperature 97.4 F (36.3 C), temperature source Oral, resp. rate 18, height 5' 0.5" (1.537 m), weight 83.462 kg (184 lb), last menstrual period 05/09/2008, SpO2 100.00%.Body mass index is 35.33 kg/(m^2).  General Appearance: Fairly Groomed  Eye Contact::  Good  Speech:  Normal Rate  Volume:  Normal  Mood:  Angry and Irritable  Affect:   Restricted and irritable   Thought Process:  Linear  Orientation:  Fully alert and attentive   Thought Content:  denies hallucinations, does not appear internally preoccupied at this time, appears guarded and paranoid, and denies or minimizes events as described in Initial Assessment  Suicidal Thoughts:  No- denies suicidal or homicidal ideations and contracts for safety on the unit   Homicidal Thoughts:  No  Memory:  recent and remote grossly intact   Judgement:  Fair  Insight:  Lacking  Psychomotor Activity:  Normal  Concentration:  Fair  Recall:  Good  Fund of Knowledge:Good  Language: Good  Akathisia:  Negative  Handed:  Right  AIMS (if indicated):     Assets:  Desire for Improvement Resilience  Sleep:  Number of Hours: 6.75   Musculoskeletal: Strength & Muscle Tone: within normal limits Gait & Station: normal Patient leans: N/A  Current Medications: Current Facility-Administered Medications  Medication Dose Route Frequency Provider Last Rate Last Dose  . acetaminophen (TYLENOL) tablet 650 mg  650 mg Oral Q8H PRN Prashant K Singh, MD   650 mg at 02/09/14 0832  . albuterol (PROVENTIL) (2.5 MG/3ML) 0.083% nebulizer solution 2.5 mg  2.5 mg Nebulization Q4H PRN Gerald D Taylor, MD   2.5 mg at 02/08/14 2125  . divalproex (DEPAKOTE ER) 24 hr tablet 500 mg  500 mg Oral QHS Gerald D Taylor, MD   500 mg at 02/08/14   2124  . doxepin (SINEQUAN) capsule 50 mg  50 mg Oral QHS Gerald D Taylor, MD   50 mg at 02/08/14 2125  . feeding supplement (GLUCERNA SHAKE) (GLUCERNA SHAKE) liquid 237 mL  237 mL Oral BID BM Heather S Winkler, RD   237 mL at 02/08/14 2146  . furosemide (LASIX) tablet 80 mg  80 mg Oral BID Prashant K Singh, MD   80 mg at 02/09/14 0749  . magnesium hydroxide (MILK OF MAGNESIA) suspension 30 mL  30 mL Oral Daily PRN Gerald D Taylor, MD   30 mL at 02/06/14 0626  . metoprolol tartrate (LOPRESSOR) tablet 25 mg  25 mg Oral BID Prashant K Singh, MD   25 mg at 02/09/14 0749  .  mometasone-formoterol (DULERA) 100-5 MCG/ACT inhaler 2 puff  2 puff Inhalation BID Gerald D Taylor, MD   2 puff at 02/09/14 0755  . nicotine (NICODERM CQ - dosed in mg/24 hours) patch 21 mg  21 mg Transdermal Daily Saramma Eappen, MD   21 mg at 02/09/14 0749  . simvastatin (ZOCOR) tablet 10 mg  10 mg Oral QHS     10 mg at 02/08/14 2151  . ziprasidone (GEODON) capsule 120 mg  120 mg Oral QHS          Lab Results:  Results for orders placed during the hospital encounter of 02/05/14 (from the past 48 hour(s))  GLUCOSE, CAPILLARY     Status: None   Collection Time    02/08/14  6:03 AM      Result Value Ref Range   Glucose-Capillary 73  70 - 99 mg/dL  URINALYSIS, ROUTINE W REFLEX MICROSCOPIC     Status: Abnormal   Collection Time    02/08/14  6:15 PM      Result Value Ref Range   Color, Urine YELLOW  YELLOW   APPearance CLEAR  CLEAR   Specific Gravity, Urine 1.006  1.005 - 1.030   pH 7.0  5.0 - 8.0   Glucose, UA NEGATIVE  NEGATIVE mg/dL   Hgb urine dipstick NEGATIVE  NEGATIVE   Bilirubin Urine NEGATIVE  NEGATIVE   Ketones, ur NEGATIVE  NEGATIVE mg/dL   Protein, ur 100 (*) NEGATIVE mg/dL   Urobilinogen, UA 0.2  0.0 - 1.0 mg/dL   Nitrite NEGATIVE  NEGATIVE   Leukocytes, UA NEGATIVE  NEGATIVE   Comment: Performed at [facility]  URINE MICROSCOPIC-ADD ON     Status: None   Collection Time    02/08/14  6:15 PM      Result Value Ref Range   Squamous Epithelial / LPF RARE  RARE   WBC, UA 0-2  <3 WBC/hpf   RBC / HPF 0-2  <3 RBC/hpf   Comment: Performed at [facility]  GLUCOSE, CAPILLARY     Status: None   Collection Time    02/09/14  6:05 AM      Result Value Ref Range   Glucose-Capillary 88  70 - 99 mg/dL  BASIC METABOLIC PANEL     Status: Abnormal   Collection Time    02/09/14  6:26 AM      Result Value Ref Range   Sodium 140  137 - 147 mEq/L   Potassium 4.5  3.7 - 5.3 mEq/L   Chloride 103  96 - 112 mEq/L   CO2  26  19 - 32 mEq/L   Glucose, Bld 89  70 - 99 mg/dL   BUN 40 (*) 6 - 23   mg/dL   Creatinine, Ser 4.42 (*) 0.50 - 1.10 mg/dL   Calcium 10.0  8.4 - 10.5 mg/dL   GFR calc non Af Amer 10 (*) >90 mL/min   GFR calc Af Amer 12 (*) >90 mL/min   Comment: (NOTE)     The eGFR has been calculated using the CKD EPI equation.     This calculation has not been validated in all clinical situations.     eGFR's persistently <90 mL/min signify possible Chronic Kidney     Disease.   Anion gap 11  5 - 15   Comment: Performed at [facility]  CBC     Status: Abnormal   Collection Time    02/09/14  6:26 AM      Result Value Ref Range   WBC 4.2  4.0 - 10.5 K/uL   RBC 2.67 (*) 3.87 - 5.11 MIL/uL   Hemoglobin 8.1 (*) 12.0 - 15.0 g/dL   HCT 27.0 (*) 36.0 - 46.0 %   MCV 101.1 (*) 78.0 - 100.0 fL   MCH 30.3  26.0 - 34.0 pg   MCHC 30.0  30.0 - 36.0 g/dL   RDW 16.8 (*) 11.5 - 15.5 %   Platelets 298  150 - 400 K/uL   Comment: Performed at [place]     Status: Abnormal   Collection Time    02/09/14  6:26 AM      Result Value Ref Range   Vitamin B-12 996 (*) 211 - 911 pg/mL   Comment: Performed at [place]     Status: None   Collection Time    02/09/14  6:26 AM      Result Value Ref Range   Folate 11.8     Comment: (NOTE)     Reference Ranges            Deficient:       0.4 - 3.3 ng/mL            Indeterminate:   3.4 - 5.4 ng/mL            Normal:              > 5.4 ng/mL     Performed at [place] TIBC     Status: Abnormal   Collection Time    02/09/14  6:26 AM      Result Value Ref Range   Iron 38 (*) 42 - 135 ug/dL   TIBC 258  250 - 470 ug/dL   Saturation Ratios 15 (*) 20 - 55 %   UIBC 220  125 - 400 ug/dL   Comment: Performed at [place]     Status: None   Collection Time    02/09/14  6:26 AM      Result Value Ref Range   Ferritin 102  10 - 291 ng/mL   Comment: Performed at  [place]     Status: Abnormal   Collection Time    02/09/14  6:26 AM      Result Value Ref Range   Retic Ct Pct 4.2 (*) 0.4 - 3.1 %   RBC. 2.67 (*) 3.87 - 5.11 MIL/uL   Retic Count, Manual 112.1  19.0 - 186.0 K/uL   Comment: Performed at [facility]    Physical Findings: AIMS: Facial and Oral Movements Muscles of Facial Expression: None, normal Lips  and Perioral Area: None, normal Jaw: None, normal Tongue: None, normal,Extremity Movements Upper (arms, wrists, hands, fingers): None, normal Lower (legs, knees, ankles, toes): None, normal, Trunk Movements Neck, shoulders, hips: None, normal, Overall Severity Severity of abnormal movements (highest score from questions above): None, normal Incapacitation due to abnormal movements: None, normal Patient's awareness of abnormal movements (rate only patient's report): No Awareness, Dental Status Current problems with teeth and/or dentures?: No Does patient usually wear dentures?: No  CIWA:    COWS:      Assessment: Patient remains irritable, angry, but not threatening or disruptive. Denies hallucinations- has been taking medications with staff encouragement. Limited insight.  Treatment Plan Summary: Daily contact with patient to assess and evaluate symptoms and progress in treatment Medication management See below   Plan:  Continue with plan of care Continue crisis management Encourage to participate in group and individual sessions Continue medication management/ and review as needed Depakote ER 500 mgrs QHS  Doxepin 50 mgrs QHS 120mg daily at bedtime for mood stabilization. Discharge  Plan in progress Address health issues /V/S as needed   Medical Decision Making Problem Points:  Established problem, improving (1), Review of last therapy session (1) and Review of psycho-social stressors (1) Data Points:  Review or order clinical lab tests (1) Review of medication regiment & side  effects (2)  I certify that inpatient services furnished can reasonably be expected to improve the patient's condition.   ONUOHA, JOSEPHINE, C, PMHNP-BC 02/09/2014, 4:23 PM  Patient seen, evaluated and I agree with notes by Nurse Practitioner.  , MD 

## 2014-02-09 NOTE — [facility] Group Notes (Signed)
[facility] Group Notes:  (Clinical Social Work)  02/09/2014   11:15am-12:00pm  Summary of Progress/Problems:  The main focus of today's process group was to listen to a variety of genres of music and to identify that different types of music provoke different responses.  The patient then was able to identify personally what was soothing for them, as well as energizing.  Handouts were used to record feelings evoked, as well as how patient can personally use this knowledge in sleep habits, with depression, and with other symptoms.  The patient expressed understanding of concepts, as well as knowledge of how each type of music affected him/her and how this can be used at home as a wellness/recovery tool.  She was focused on sharing her thoughts about each piece of music, rather than her feelings, and had to be redirected multiple times.  Her affect was flat, and enjoyment of any particular music did not register with her affect.  She left about 30 minutes into group, and did not return.  Type of Therapy:  Music Therapy   Participation Level:  Active  Participation Quality:  Attentive and Sharing and Resistant  Affect:  Flat and Depressed  Cognitive:  Alert  Insight:  Limited  Engagement in Therapy:  Limited  Modes of Intervention:   Activity, Exploration  Selmer Dominion, LCSW 02/09/2014, 12:30pm

## 2014-02-10 DIAGNOSIS — D649 Anemia, unspecified: Secondary | ICD-10-CM

## 2014-02-10 DIAGNOSIS — E785 Hyperlipidemia, unspecified: Secondary | ICD-10-CM

## 2014-02-10 DIAGNOSIS — R7309 Other abnormal glucose: Secondary | ICD-10-CM

## 2014-02-10 DIAGNOSIS — Z720 Tobacco use: Secondary | ICD-10-CM

## 2014-02-10 DIAGNOSIS — Z9114 Patient's other noncompliance with medication regimen: Secondary | ICD-10-CM

## 2014-02-10 LAB — GLUCOSE, CAPILLARY
Glucose-Capillary: 72 mg/dL (ref 70–99)
Glucose-Capillary: 99 mg/dL (ref 70–99)

## 2014-02-10 MED ORDER — LIDOCAINE 5 % EX PTCH
2.0000 | MEDICATED_PATCH | Freq: Every day | CUTANEOUS | Status: DC
  Administered 2014-02-10 – 2014-02-12 (×3): 2 via TRANSDERMAL
  Filled 2014-02-10 (×6): qty 2

## 2014-02-10 MED ORDER — OLANZAPINE 10 MG PO TBDP: 5.0000 mg | ORAL_TABLET | Freq: Two times a day (BID) | ORAL | Status: DC | PRN

## 2014-02-10 MED ORDER — HYDROXYZINE HCL 25 MG PO TABS
25.0000 mg | ORAL_TABLET | Freq: Three times a day (TID) | ORAL | Status: DC | PRN
  Administered 2014-02-10: 25 mg via ORAL
  Filled 2014-02-10 (×2): qty 1

## 2014-02-10 MED ORDER — DIVALPROEX SODIUM ER 500 MG PO TB24
1000.0000 mg | ORAL_TABLET | Freq: Every day | ORAL | Status: DC
  Administered 2014-02-10: 1000 mg via ORAL
  Filled 2014-02-10 (×3): qty 2

## 2014-02-10 MED ORDER — BUSPIRONE HCL 5 MG PO TABS
5.0000 mg | ORAL_TABLET | Freq: Two times a day (BID) | ORAL | Status: DC
  Filled 2014-02-10 (×2): qty 1

## 2014-02-10 MED ORDER — TEMAZEPAM 7.5 MG PO CAPS: 7.5000 mg | ORAL_CAPSULE | Freq: Every evening | ORAL | Status: DC | PRN

## 2014-02-10 MED ORDER — TEMAZEPAM 15 MG PO CAPS
15.0000 mg | ORAL_CAPSULE | Freq: Every evening | ORAL | Status: DC | PRN
  Administered 2014-02-10: 15 mg via ORAL
  Filled 2014-02-10: qty 1

## 2014-02-10 NOTE — Progress Notes (Signed)
Patient Demographics  Jennifer Lawson, is a 53 y.o. female, DOB - 1960-10-29, XBL:[id number]  Admit date - 02/05/2014   Admitting Physician Jennifer Alert, MD  Outpatient Primary MD for the patient is OSEI-BONSU,GEORGE, MD  LOS - 5   No chief complaint on file.       Subjective:   Jennifer Lawson today has, No headache, No chest pain, No abdominal pain - No Nausea, No new weakness tingling or numbness, No Cough - SOB. Denies any urinary complaints, reports she has been urinating with no problems.  Assessment & Plan    Principal Problem:   Anuria Active Problems:   CKD (chronic kidney disease)   HTN (hypertension)   Uncontrolled hypertension   Tobacco use disorder   COPD (chronic obstructive pulmonary disease)   Bipolar 1 disorder   Chronic kidney disease stage V  - Appears to be at her baseline. -Renal ultrasound showed no acute finding -Continue with Lasix 80 mg oral 2 times a day -Continue to follow with nephrology as an outpatient.  Essential hypertension -Norvasc was changed to metoprolol. -Acceptable, continue to monitor   Recent history of pneumonia. -Once discharged must follow with PCP to get CBC and a 2 view chest x-ray rechecked.   Anemia -Stable, at her baseline, is most likely in the setting of anemia of chronic disease/chronic kidney disease, -appears to be macrocytic, B12 and folate within normal level, has normal ferritin, but low iron levels, TIBC within normal limits, but ferritin within normal limits as well, so anemia of iron deficiency is low probability, and her anemia is most likely related to chronic kidney disease, patient needs to follow with nephrology as an outpatient as she might require Procrit shots.  Tobacco and alcohol abuse.  Counseled to quit both. We'll defer treatment to primary team.    Psychosis, PTSD,  bipolar disorder.  Defer to psych team.      We will sign off, please contact us there is any further questions arise.      Scheduled Meds: . divalproex  1,000 mg Oral QHS  . feeding supplement (GLUCERNA SHAKE)  237 mL Oral BID BM  . furosemide  80 mg Oral BID  . lidocaine  2 patch Transdermal Daily  . metoprolol tartrate  25 mg Oral BID  . mometasone-formoterol  2 puff Inhalation BID  . nicotine  21 mg Transdermal Daily  . simvastatin  10 mg Oral QHS  . ziprasidone  120 mg Oral QHS   Continuous Infusions:  PRN Meds:.acetaminophen, albuterol, hydrOXYzine, magnesium hydroxide, OLANZapine zydis, temazepam    Lab Results  Component Value Date   PLT 298 02/09/2014      Anti-infectives   None          Objective:   Filed Vitals:   02/08/14 2050 02/09/14 0836 02/09/14 0837 02/10/14 0839  BP: 124/91 103/65 104/66 123/88  Pulse: 87 79 87 85  Temp: 97.8 F (36.6 C) 97.4 F (36.3 C)    TempSrc: Oral Oral    Resp: 16 18    Height:      Weight:      SpO2:        Wt Readings from Last 3 Encounters:  02/05/14 83.462 kg (184 lb)  01/20/14 87.3  kg (192 lb 7.4 oz)  09/24/13 88.451 kg (195 lb)    No intake or output data in the 24 hours ending 02/10/14 1646   Physical Exam  Awake Lawson, Oriented X 3, No new F.N deficits, Normal affect [place].AT,PERRAL Supple Neck,No JVD, No cervical lymphadenopathy appriciated.  Symmetrical Chest wall movement, Good air movement bilaterally, CTAB RRR,No Gallops,Rubs or new Murmurs, No Parasternal Heave +ve B.Sounds, Abd Soft, No tenderness, No organomegaly appriciated, No rebound - guarding or rigidity. No Cyanosis, Clubbing or edema, No new Rash or bruise     Data Review   Micro Results Recent Results (from the past 240 hour(s))  URINE CULTURE     Status: None   Collection Time    02/08/14  6:15 PM      Result Value Ref Range Status   Specimen Description     Final   Value: URINE, CLEAN CATCH     Performed at [place] Requests     Final   Value: NONE     Performed at [facility]   Culture  Setup Time     Final   Value: 02/09/2014 14:13     Performed at [place] PENDING   Incomplete   Culture     Final   Value: Culture reincubated for better growth     Performed at [facility]   Report Status PENDING   Incomplete    Radiology Reports US Renal  02/09/2014   CLINICAL DATA:  Renal failure. History of hypertension, diabetes. Initial encounter.  EXAM: RENAL/URINARY TRACT ULTRASOUND COMPLETE  COMPARISON:  Abdominal ultrasound - 01/20/2013; CT abdomen pelvis -11/13/2012  FINDINGS: Right Kidney:  The right kidney appears slightly diminutive in size measure 9.7 cm in length. There is diffuse increased echogenicity of the renal parenchyma compatible with provided history of medical renal disease. Note is made of an approximately 1.9 x 1.9 x 1.9 cm partially exophytic anechoic cyst arising from the inferior pole the right kidney. No focal renal lesions. No echogenic renal stones. No urinary obstruction.  Left Kidney:  The left kidney appears slightly diminutive in size measuring 9.2 cm in length. There is diffuse increased echogenicity of the renal parenchyma compatible with provided history of medical renal disease. Note is made of an approximately 1.0 x 1.4 x 0.9 cm anechoic partially exophytic cyst arising from the mid aspect of the left kidney. No focal renal lesions. No echogenic renal stones. No urinary obstruction.  Bladder:  Appears normal for degree of bladder distention.  Multiple echogenic shadowing stones are incidentally noted within otherwise normal-appearing gallbladder.  IMPRESSION: 1. Atrophy and diffuse marked increased echogenicity of the renal parenchyma compatible with provided history of medical renal disease. No evidence urinary obstruction. 2. Incidentally noted bilateral renal cysts. 3. Cholelithiasis without  evidence of cholecystitis.   Electronically Signed   By: Sandi Mariscal M.D.   On: 02/09/2014 10:37    CBC  Recent Labs Lab 02/04/14 1609 02/09/14 0626  WBC 5.1 4.2  HGB 7.6* 8.1*  HCT 25.0* 27.0*  PLT 342 298  MCV 100.8* 101.1*  MCH 30.6 30.3  MCHC 30.4 30.0  RDW 17.8* 16.8*  LYMPHSABS 1.7  --   MONOABS 0.4  --   EOSABS 0.2  --   BASOSABS 0.0  --     Chemistries   Recent Labs Lab 02/04/14 1609 02/09/14 0626  NA 139 140  K 4.0 4.5  CL 101 103  CO2 26 26  GLUCOSE 135* 89  BUN 49* 40*  CREATININE 4.50* 4.42*  CALCIUM 10.4 10.0  AST 10  --   ALT 6  --   ALKPHOS 50  --   BILITOT <0.2*  --    ------------------------------------------------------------------------------------------------------------------ estimated creatinine clearance is 14.1 ml/min (by C-G formula based on Cr of 4.42). ------------------------------------------------------------------------------------------------------------------ No results found for this basename: HGBA1C,  in the last 72 hours ------------------------------------------------------------------------------------------------------------------ No results found for this basename: CHOL, HDL, LDLCALC, TRIG, CHOLHDL, LDLDIRECT,  in the last 72 hours ------------------------------------------------------------------------------------------------------------------ No results found for this basename: TSH, T4TOTAL, FREET3, T3FREE, THYROIDAB,  in the last 72 hours ------------------------------------------------------------------------------------------------------------------  Recent Labs  02/09/14 0626  VITAMINB12 996*  FOLATE 11.8  FERRITIN 102  TIBC 258  IRON 38*  RETICCTPCT 4.2*    Coagulation profile No results found for this basename: INR, PROTIME,  in the last 168 hours  No results found for this basename: DDIMER,  in the last 72 hours  Cardiac Enzymes No results found for this basename: CK, CKMB, TROPONINI, MYOGLOBIN,  in  the last 168 hours ------------------------------------------------------------------------------------------------------------------ No components found with this basename: POCBNP,      Time Spent in minutes   25 minute   ELGERGAWY, DAWOOD M.D on 02/10/2014 at 4:46 PM  Between 7am to 7pm - Pager - [phone]  After 7pm go to [email or web address] - password [email or web address]  And look for the night coverage person covering for me after hours  [facility] Office  [phone]   **Disclaimer: This note may have been dictated with voice recognition software. Similar sounding words can inadvertently be transcribed and this note may contain transcription errors which may not have been corrected upon publication of note.**

## 2014-02-10 NOTE — BHH Group Notes (Signed)
[facility] LCSW Aftercare Discharge Planning Group Note   02/10/2014 5:10 PM  Participation Quality:  Did not attend    [place]

## 2014-02-10 NOTE — Progress Notes (Signed)
D: Patient presents with blunted, irritable affect and anxious mood. Declined self inventory sheet today. Patient verbalized that she dislikes having a roommate in the room with her because she's concerned about someone snooping through her paperwork and belongings; reports that one of her medications is causing her to be dizzy; she's tearful at times. Writer has observed that the patient walks a lot to the hall and then will go back into her room and review medication info, looking for side effects and then address it with staff. Patient appears extremely paranoid and mildly confused. Participating in some groups. Adheres to medication regimen at this time.  A: Support and encouragement provided to patient. Administered scheduled medications per ordering MD. Monitor Q15 minute checks for safety.  R: Patient receptive. Denies SI/HI/AVH. Patient remains safe on the unit.

## 2014-02-10 NOTE — BHH Group Notes (Signed)
[facility] LCSW Group Therapy  02/10/2014 1:15 pm  Type of Therapy: Process Group Therapy  Participation Level:  Active  Participation Quality:  Appropriate  Affect:  Flat  Cognitive:  Oriented  Insight:  Improving  Engagement in Group:  Limited  Engagement in Therapy:  Limited  Modes of Intervention:  Activity, Clarification, Education, Problem-solving and Support  Summary of Progress/Problems: Today's group addressed the issue of overcoming obstacles.  Patients were asked to identify their biggest obstacle post d/c that stands in the way of their on-going success, and then problem solve as to how to manage this.  Came to group 52m minutes into it.  Immediately tried to jump in and demanded to know what we were talking about.  I redirected and told her to pay attention for at least 5 minutes to see if she could pick up on the theme.  She left, came back and began complaining about her roommate who she said was keeping her from her breathing treatments.  "What kind of place is this?  I also had my own room in the past.  There are crazy people here.  How am I supposed to get better?"  Difficult to redirect.  [name] 02/10/2014   5:10 PM

## 2014-02-10 NOTE — Progress Notes (Signed)
D: Patient in the hallway on approach.  Patient states she had an ok day but states she does not trust the hospital.  Patient continues to be paranoid and blaming others.  Patient demanding and irritable.  Patient denies SI/HI and denies AVH.  Patient went to her room tonight and pressed the call bell several times wanting staff to bring several items. A: Staff to monitor Q 15 mins for safety.  Encouragement and support offered.  Scheduled medications administered per orders.  R: Patient remains safe on the unit.  Patient did not attend group tonight.  Patient taking administered medications with encouragement.  Patient visible on the unit briefly.

## 2014-02-10 NOTE — Progress Notes (Addendum)
Patient ID: Jennifer Lawson, female   DOB: 1960-06-29, 53 y.o.   MRN: [id number] [facility] MD Progress Note  02/10/2014 3:01 PM Jennifer Lawson  MRN:  [id number] Subjective:  " I want to know when I am leaving ". Objective:   Patient was seen and chart reviewed. Patient continues to be anxious and reports she wants to leave. Patient has multiple complaints about her stay at the hospital ,does not get along with her room mate as well is suspicious about staff. Patient was seen this AM while her daughter was on the phone ,discussed disposition plan with daughter. Daughter willing to have patient back home. Patient today also has been complaining of multiple somatic issues as well as pain. Patient denies SI/HI/AH/VH. Patient reports she feels dizzy today. She was also seen crying ,complaining of all the side effects her medications can cause her. Patient is very labile ,tearful at times . Discussed with patient that she needs to take her meals as well as drink po fluids.    DSM5:  Primary psychiatric diagnosis:  Bipolar disorder ,type I ,with mixed features, moderate with psychotic features.   Secondary psychiatric diagnosis:  Tobacco use disorder,mild  Alcohol use disorder,moderate  Noncompliance with medications  PTSD per history   Non psychiatric diagnosis:  Hypertension  CKD  Asthma  COPD  GERD  Pre diabetic  Hx of heart murmur          Total Time spent with patient: 35 minutes   ADL's:  fair  Sleep: fair   Appetite:  Fair    Psychiatric Specialty Exam: Physical Exam  Review of Systems  Constitutional: Negative for fever and chills.  Respiratory: Negative for cough and shortness of breath.   Cardiovascular: Negative for chest pain.  Musculoskeletal: Positive for back pain.  Skin: Negative for rash.  Psychiatric/Behavioral: Positive for depression.    Blood pressure 123/88, pulse 85, temperature 97.4 F (36.3 C), temperature source Oral, resp. rate 18, height  5' 0.5" (1.537 m), weight 83.462 kg (184 lb), last menstrual period 05/09/2008, SpO2 100.00%.Body mass index is 35.33 kg/(m^2).  General Appearance: Fairly Groomed  Engineer, water::  Good  Speech:  Normal Rate  Volume:  Normal  Mood:  Anxious and Irritable  Affect:  Restricted  Thought Process:  Linear  Orientation:  Fully alert and attentive   Thought Content:  denies hallucinations, does not appear internally preoccupied at this time, appears guarded and paranoid, and denies or minimizes events as described in Initial Assessment  Suicidal Thoughts:  No- denies suicidal or homicidal ideations and contracts for safety on the unit   Homicidal Thoughts:  No  Memory:  recent and remote grossly intact   Judgement:  Fair  Insight:  Lacking  Psychomotor Activity:  Normal  Concentration:  Fair  Recall:  Good  Fund of Knowledge:Good  Language: Good  Akathisia:  Negative  Handed:  Right  AIMS (if indicated):     Assets:  Desire for Improvement Resilience  Sleep:  Number of Hours: 6   Musculoskeletal: Strength & Muscle Tone: within normal limits Gait & Station: normal Patient leans: N/A  Current Medications: Current Facility-Administered Medications  Medication Dose Route Frequency Provider Last Rate Last Dose  . acetaminophen (TYLENOL) tablet 650 mg  650 mg Oral Q8H PRN Thurnell Lose, MD   650 mg at 02/09/14 2007  . albuterol (PROVENTIL) (2.5 MG/3ML) 0.083% nebulizer solution 2.5 mg  2.5 mg Nebulization Q4H PRN Clarene Reamer, MD   2.5 mg  at 02/10/14 1446  . divalproex (DEPAKOTE ER) 24 hr tablet 500 mg  500 mg Oral QHS Clarene Reamer, MD   500 mg at 02/09/14 2118  . doxepin (SINEQUAN) capsule 50 mg  50 mg Oral QHS Clarene Reamer, MD   50 mg at 02/09/14 2217  . feeding supplement (GLUCERNA SHAKE) (GLUCERNA SHAKE) liquid 237 mL  237 mL Oral BID BM Toribio Harbour, RD   237 mL at 02/10/14 1400  . furosemide (LASIX) tablet 80 mg  80 mg Oral BID Thurnell Lose, MD   80 mg at 02/10/14  0847  . lidocaine (LIDODERM) 5 % 2 patch  2 patch Transdermal Daily Ursula Alert, MD   2 patch at 02/10/14 1153  . magnesium hydroxide (MILK OF MAGNESIA) suspension 30 mL  30 mL Oral Daily PRN Clarene Reamer, MD   30 mL at 02/09/14 2119  . metoprolol tartrate (LOPRESSOR) tablet 25 mg  25 mg Oral BID Thurnell Lose, MD   25 mg at 02/10/14 0847  . mometasone-formoterol (DULERA) 100-5 MCG/ACT inhaler 2 puff  2 puff Inhalation BID Clarene Reamer, MD   2 puff at 02/10/14 0857  . nicotine (NICODERM CQ - dosed in mg/24 hours) patch 21 mg  21 mg Transdermal Daily Ursula Alert, MD   21 mg at 02/10/14 0857  . simvastatin (ZOCOR) tablet 10 mg  10 mg Oral QHS Mojeed Akintayo   10 mg at 02/09/14 2217  . ziprasidone (GEODON) capsule 120 mg  120 mg Oral QHS Mojeed Akintayo   120 mg at 02/09/14 2217    Lab Results:  Results for orders placed during the hospital encounter of 02/05/14 (from the past 48 hour(s))  URINE CULTURE     Status: None   Collection Time    02/08/14  6:15 PM      Result Value Ref Range   Specimen Description       Value: URINE, CLEAN CATCH     Performed at [facility]   Special Requests       Value: NONE     Performed at [place] Time       Value: 02/09/2014 14:13     Performed at [place] PENDING     Culture       Value: Culture reincubated for better growth     Performed at [place], ROUTINE W REFLEX MICROSCOPIC     Status: Abnormal   Collection Time    02/08/14  6:15 PM      Result Value Ref Range   Color, Urine YELLOW  YELLOW   APPearance CLEAR  CLEAR   Specific Gravity, Urine 1.006  1.005 - 1.030   pH 7.0  5.0 - 8.0   Glucose, UA NEGATIVE  NEGATIVE mg/dL   Hgb urine dipstick NEGATIVE  NEGATIVE   Bilirubin Urine NEGATIVE  NEGATIVE   Ketones, ur NEGATIVE  NEGATIVE mg/dL   Protein, ur 100 (*) NEGATIVE mg/dL   Urobilinogen, UA  0.2  0.0 - 1.0 mg/dL   Nitrite NEGATIVE  NEGATIVE   Leukocytes, UA NEGATIVE  NEGATIVE   Comment: Performed at [place]     Status: None   Collection Time    02/08/14  6:15 PM      Result Value Ref Range   Squamous Epithelial /  LPF RARE  RARE   WBC, UA 0-2  <3 WBC/hpf   RBC / HPF 0-2  <3 RBC/hpf   Comment: Performed at [place], CAPILLARY     Status: None   Collection Time    02/09/14  6:05 AM      Result Value Ref Range   Glucose-Capillary 88  70 - 99 mg/dL  BASIC METABOLIC PANEL     Status: Abnormal   Collection Time    02/09/14  6:26 AM      Result Value Ref Range   Sodium 140  137 - 147 mEq/L   Potassium 4.5  3.7 - 5.3 mEq/L   Chloride 103  96 - 112 mEq/L   CO2 26  19 - 32 mEq/L   Glucose, Bld 89  70 - 99 mg/dL   BUN 40 (*) 6 - 23 mg/dL   Creatinine, Ser 4.42 (*) 0.50 - 1.10 mg/dL   Calcium 10.0  8.4 - 10.5 mg/dL   GFR calc non Af Amer 10 (*) >90 mL/min   GFR calc Af Amer 12 (*) >90 mL/min   Comment: (NOTE)     The eGFR has been calculated using the CKD EPI equation.     This calculation has not been validated in all clinical situations.     eGFR's persistently <90 mL/min signify possible Chronic Kidney     Disease.   Anion gap 11  5 - 15   Comment: Performed at [facility]  CBC     Status: Abnormal   Collection Time    02/09/14  6:26 AM      Result Value Ref Range   WBC 4.2  4.0 - 10.5 K/uL   RBC 2.67 (*) 3.87 - 5.11 MIL/uL   Hemoglobin 8.1 (*) 12.0 - 15.0 g/dL   HCT 27.0 (*) 36.0 - 46.0 %   MCV 101.1 (*) 78.0 - 100.0 fL   MCH 30.3  26.0 - 34.0 pg   MCHC 30.0  30.0 - 36.0 g/dL   RDW 16.8 (*) 11.5 - 15.5 %   Platelets 298  150 - 400 K/uL   Comment: Performed at [place]     Status: Abnormal   Collection Time    02/09/14  6:26 AM      Result Value Ref Range   Vitamin B-12 996 (*) 211 - 911 pg/mL   Comment: Performed at [place]     Status: None   Collection Time    02/09/14  6:26 AM      Result Value Ref Range   Folate 11.8     Comment: (NOTE)     Reference Ranges            Deficient:       0.4 - 3.3 ng/mL            Indeterminate:   3.4 - 5.4 ng/mL            Normal:              > 5.4 ng/mL     Performed at [place] TIBC     Status: Abnormal   Collection Time    02/09/14  6:26 AM      Result Value Ref Range   Iron 38 (*) 42 - 135 ug/dL   TIBC 258  250 - 470 ug/dL   Saturation Ratios 15 (*)  20 - 55 %   UIBC 220  125 - 400 ug/dL   Comment: Performed at [place]     Status: None   Collection Time    02/09/14  6:26 AM      Result Value Ref Range   Ferritin 102  10 - 291 ng/mL   Comment: Performed at [place]     Status: Abnormal   Collection Time    02/09/14  6:26 AM      Result Value Ref Range   Retic Ct Pct 4.2 (*) 0.4 - 3.1 %   RBC. 2.67 (*) 3.87 - 5.11 MIL/uL   Retic Count, Manual 112.1  19.0 - 186.0 K/uL   Comment: Performed at [place], CAPILLARY     Status: None   Collection Time    02/10/14  6:48 AM      Result Value Ref Range   Glucose-Capillary 72  70 - 99 mg/dL  GLUCOSE, CAPILLARY     Status: None   Collection Time    02/10/14 11:43 AM      Result Value Ref Range   Glucose-Capillary 99  70 - 99 mg/dL    Physical Findings: AIMS: Facial and Oral Movements Muscles of Facial Expression: None, normal Lips and Perioral Area: None, normal Jaw: None, normal Tongue: None, normal,Extremity Movements Upper (arms, wrists, hands, fingers): None, normal Lower (legs, knees, ankles, toes): None, normal, Trunk Movements Neck, shoulders, hips: None, normal, Overall Severity Severity of abnormal movements (highest score from questions above): None, normal Incapacitation due to abnormal movements: None, normal Patient's awareness of abnormal movements (rate only patient's  report): No Awareness, Dental Status Current problems with teeth and/or dentures?: No Does patient usually wear dentures?: No  CIWA:    COWS:      Assessment: Patient remains irritable, angry, but not threatening or disruptive. Denies hallucinations- has been taking medications with staff encouragement. Limited insight.  Treatment Plan Summary: Daily contact with patient to assess and evaluate symptoms and progress in treatment Medication management See below   Plan:  Continue with plan of care Continue crisis management Encourage to participate in group and individual sessions Continue medication management/ and review as needed  For Bipolar disorder Increase Depakote ER to 1000 mgs QHS  Will DC Doxepin. Will start a trial of Restoril 15 mg po prn qhs. Geodon 169m daily at bedtime for mood stabilization. Will add vistaril prn for anxiety issues.  Chronic kidney disease stage V  - Appears to be at her baseline.  -Renal ultrasound showed no acute finding  -Continue with Lasix 80 mg oral 2 times a day  -Continue to follow with nephrology as an outpatient.   Essential hypertension  -Norvasc was changed to metoprolol.  -Acceptable, continue to monitor   Recent history of pneumonia.  -Once discharged must follow with PCP to get CBC and a 2 view chest x-ray rechecked. (per IM consult)  Anemia  -Stable, at her baseline, is most likely in the setting of anemia of chronic disease/chronic kidney disease, appears to be macrocyte take, and B12 level still pending, as well rest of anemia panel still pending, will continue to follow.     Discharge  Plan in progress Address health issues /V/S as needed   Medical Decision Making Problem Points:  Established problem, improving (1), Review of last therapy session (1) and Review of psycho-social stressors (1) Data Points:  Review or order clinical lab tests (1)  Review of medication regiment & side effects (2) Review of new medications  or change in dosage (2)  I certify that inpatient services furnished can reasonably be expected to improve the patient's condition.   Maiya Kates,02/10/2014, 3:01 PM

## 2014-02-10 NOTE — Progress Notes (Addendum)
D: Pt has had labile affect and mood.  Pt denies SI/HI, denies hallucinations.  Pt is demanding, irritable at times.  When writer asked pt if she would like to get her medications initially, pt stated "Yes, I'll take them.  Will you bring them to my room?  Can I get some heat packs too?"  Writer brought medications and heat packs to pt's room.  Pt then stated "I don't want to take these.  I got Ativan earlier and it helped, so why can't I just have the Ativan again?" A: Supported and encouraged pt.  Safety maintained.  Pt educated on scheduled and PRN medications.  Informed pt that the Ativan she received on the previous shift was a one time order.   R: Pt stated "well, I need a list of all the side effects of these meds before I'm taking them."  Writer informed pt that printed medication information would be provided.  Before medication information could be printed, pt came to med window and asked for her medications.  Pt then said she is only taking the Depakote tonight.  Pt stated "I don't need the Geodon and the Doxepin doesn't help me sleep.  I don't even want to sleep right now, not with all these people walking around."  Pt was provided with medication information in the form of hand outs.  Pt remained hesitant to take scheduled medications, coming to the medication window multiple times and walking away.  Pt stated "it says in the handbook that I have a right to refuse my medication."  Writer encouraged pt to comply with medications as ordered and confirmed that pt does have the right to refuse her medication if she chooses.  Pt then stated "I'll take them.  I just want to go home."  Pt stated "I feel like all of you are being inconsistent and that my medications are being changed or they're being given at different times.  I want the Geodon at night because I don't want to feel groggy from taking it in the morning."  Repeated to pt that Geodon was now scheduled as a night medication.  Informed pt that  medications are sometimes adjusted by ordering provider.  Pt stated "thank you.  I want my breathing treatment now.  I'm going to my room."  Pt verbally contracted for safety and is in no distress.  Will continue to monitor and assess for safety.

## 2014-02-10 NOTE — Care Management Utilization Note (Signed)
   Per State Regulation 482.30  This chart was reviewed for necessity with respect to the patient's Admission/ Duration of stay.  Next review date: 02/11/14  Skipper Cliche RN, BSN

## 2014-02-10 NOTE — Plan of Care (Signed)
Problem: Alteration in thought process Goal: LTG-Patient verbalizes understanding importance med regimen (Patient verbalizes understanding of importance of medication regimen and need to continue outpatient care.) Outcome: Not Progressing Pt remains hesitant to comply with medications as prescribed.

## 2014-02-11 LAB — URINE CULTURE

## 2014-02-11 LAB — GLUCOSE, CAPILLARY: Glucose-Capillary: 76 mg/dL (ref 70–99)

## 2014-02-11 MED ORDER — TEMAZEPAM 7.5 MG PO CAPS
7.5000 mg | ORAL_CAPSULE | Freq: Every evening | ORAL | Status: DC | PRN
  Administered 2014-02-11: 7.5 mg via ORAL
  Filled 2014-02-11: qty 1

## 2014-02-11 MED ORDER — LORAZEPAM 1 MG PO TABS: 2.0000 mg | ORAL_TABLET | Freq: Four times a day (QID) | ORAL | Status: DC | PRN

## 2014-02-11 MED ORDER — HYDROXYZINE HCL 50 MG PO TABS
50.0000 mg | ORAL_TABLET | Freq: Three times a day (TID) | ORAL | Status: DC | PRN
  Filled 2014-02-11: qty 40

## 2014-02-11 MED ORDER — DIVALPROEX SODIUM ER 500 MG PO TB24
750.0000 mg | ORAL_TABLET | Freq: Every day | ORAL | Status: DC
  Administered 2014-02-11: 750 mg via ORAL
  Filled 2014-02-11 (×3): qty 1

## 2014-02-11 NOTE — [facility] Group Notes (Signed)
[place] LCSW Group Therapy  02/11/2014 1:40 PM  Type of Therapy:  Group Therapy  Participation Level:  Active  Participation Quality:  Intrusive  Affect:  Irritable  Cognitive:  Lacking  Insight:  None  Engagement in Therapy:  Distracting and Limited  Modes of Intervention:  Confrontation, Discussion, Education, Exploration, Problem-solving, Rapport Building, Socialization and Support  Summary of Progress/Problems: Feelings around Diagnosis-Patients were encouraged to process their feelings surrounding their mental health diagnosis, discuss how they view their diagnosis, and identify the positive and negative aspects of having a mental health diagnosis. Rose was disengaged and intrusive during today's group. Klohe had difficulty remaining on topic. When asked about her feelings regarding her diagnosis, she became angry and stated that her diagnosis was wrong and talked about her distrust of doctors. Triniti went on to ask what schizophrenia was and what medications are used to treat them. Kennedy was difficult to redirect and left the room once due to "getting angry about my diagnosis. It's a sore subject for me." Jaselyn shared that her young grandchildren are her biggest supports because they are her motivation to get well.    Smart, Vola Beneke LCSWA  02/11/2014, 1:40 PM

## 2014-02-11 NOTE — Progress Notes (Addendum)
Patient requested pain medication for back, then requested medication for anxiety.  Nurse pulled medications from pyxis and then patient refused medications.  Patient finally took tylenol and walked away to her room.  Patient stated someone continues to go in other people's room.  MHT watching patient closely for safety.  MD informed of all occurrences.  Vistaril was put down sink, RN Adonis Huguenin witnessed.  1440  Patient requested oxygen concentrator which she stated she has at home and also lasix is not working for her.  MD informed.  Patient does not have SOB, wheezing, or difficulty breathing.   D:  Patient's self inventory sheet, patient stated she is sleeping better, sleep medication is helpful.  High energy level, good concentration.  Denied depression, rated hopeless #3, feels she is better when alone.  Patient has felt agitated, sedated, irritable, chest pain in past 24 hours.  Physical problems in past 24 hours lightheaded, pain, dizziness.  Physical pain #3, head, legs, back, stomach, ovaries in past 24 hours.  Goal is to get out of pain, talk to real friend, to be loved and accepted for who she is.  Stay to herself, begin to go to groups.  Does have discharge plans.  No problems taking meds after discharge. A:  Medications administered per MD orders.  Emotional support and encouragement given patient. R:  Denied SI and HI.  Denied A/V hallucinations.  Safety maintained with 15 minute checks.

## 2014-02-11 NOTE — Care Management Utilization Note (Signed)
   Per State Regulation 482.30  This chart was reviewed for necessity with respect to the patient's Admission/ Duration of stay.  Next review date: 02/14/14  Skipper Cliche RN, BSN

## 2014-02-11 NOTE — [facility] Group Notes (Signed)
The focus of this group is to educate the patient on the purpose and policies of crisis stabilization and provide a format to answer questions about their admission.  The group details unit policies and expectations of patients while admitted.\  Patient went to group this morning for the nurse education orientation and then left soon after group began.  Patient went to her room.

## 2014-02-11 NOTE — Progress Notes (Signed)
Adult Psychoeducational Group Note  Date:  02/11/2014 Time:  10:22 PM  Group Topic/Focus:  Wrap-Up Group:   The focus of this group is to help patients review their daily goal of treatment and discuss progress on daily workbooks.  Participation Level:  Minimal  Participation Quality:  Appropriate  Affect:  Flat  Cognitive:  Oriented  Insight: Limited  Engagement in Group:  Limited  Modes of Intervention:  Socialization and Support  Additional Comments:  Patient attended and participated in group tonight. She reports that her day was OK, but is could be better. She rested today,did not attended her groups. Today was a difficult day for her. She wanted to leave today but could not. The patient advised that regardless of everything its a blessing to see another day.   Salley Scarlet [facility] 02/11/2014, 10:22 PM

## 2014-02-11 NOTE — Progress Notes (Signed)
Patient ID: Jennifer Lawson, female   DOB: 1961-04-04, 53 y.o.   MRN: [id number] [facility] MD Progress Note  02/11/2014 11:48 AM SIHAM BUCARO  MRN:  [id number] Subjective:  " I want to what you gave me last night ,coz I overslept today." Objective:   Patient was seen and chart reviewed. Patient continues to be anxious and has been complaining of multiple issues around the unit that she is not happy about. Patient is irritable about other patients in her room ,reports she has no privacy , complaints about increasing her medications last night ,that made her groggy this AM and that she over slept. Patient was irritable at nursing staff this AM and accused them of lifting her shirt for applying the lidocaine patch. Patient's sleep medication was changed last night due to her complaining of starting her on doxepin which can cause a seizure and she was worried about the side effects.  Patient today also has been complaining of multiple somatic issues as well as pain. Patient denies SI/HI/AH/VH.  Patient per staff has been very demanding ,complaints of several issues and is attention seeking.   DSM5:  Primary psychiatric diagnosis:  Bipolar disorder ,type I ,with mixed features, moderate with psychotic features.   Secondary psychiatric diagnosis:  Personality disorder unspecified Tobacco use disorder,mild  Alcohol use disorder,moderate  Noncompliance with medications  PTSD per history   Non psychiatric diagnosis:  Hypertension  CKD  Asthma  COPD  GERD  Pre diabetic  Hx of heart murmur          Total Time spent with patient: 35 minutes   ADL's: good  Sleep: good  Appetite:  Fair    Psychiatric Specialty Exam: Physical Exam  Review of Systems  Constitutional: Negative for fever and chills.  Respiratory: Negative for cough and shortness of breath.   Cardiovascular: Negative for chest pain.  Musculoskeletal: Positive for back pain.  Skin: Negative for rash.   Psychiatric/Behavioral: Positive for depression.    Blood pressure 109/72, pulse 78, temperature 98.9 F (37.2 C), temperature source Oral, resp. rate 16, height 5' 0.5" (1.537 m), weight 83.462 kg (184 lb), last menstrual period 05/09/2008, SpO2 100.00%.Body mass index is 35.33 kg/(m^2).  General Appearance: Fairly Groomed  Engineer, water::  Good  Speech:  Normal Rate  Volume:  Normal  Mood:  Anxious and Irritable  Affect:  Restricted  Thought Process:  Linear  Orientation:  Fully alert and attentive   Thought Content:  denies hallucinations, does not appear internally preoccupied at this time, appears guarded and paranoid  Suicidal Thoughts:  No- denies suicidal or homicidal ideations and contracts for safety on the unit   Homicidal Thoughts:  No  Memory:  Immediate;   Fair Recent;   Fair Remote;   Fair  Judgement:  Fair  Insight:  Lacking  Psychomotor Activity:  Normal  Concentration:  Fair  Recall:  Good  Fund of Knowledge:Good  Language: Good  Akathisia:  Negative  Handed:  Right  AIMS (if indicated):     Assets:  Desire for Improvement Resilience  Sleep:  Number of Hours: 6.25   Musculoskeletal: Strength & Muscle Tone: within normal limits Gait & Station: normal Patient leans: N/A  Current Medications: Current Facility-Administered Medications  Medication Dose Route Frequency Provider Last Rate Last Dose  . acetaminophen (TYLENOL) tablet 650 mg  650 mg Oral Q8H PRN Thurnell Lose, MD   650 mg at 02/10/14 2132  . albuterol (PROVENTIL) (2.5 MG/3ML) 0.083% nebulizer solution 2.5  mg  2.5 mg Nebulization Q4H PRN Clarene Reamer, MD   2.5 mg at 02/10/14 2132  . divalproex (DEPAKOTE ER) 24 hr tablet 750 mg  750 mg Oral QHS Tamarah Bhullar, MD      . feeding supplement (GLUCERNA SHAKE) (GLUCERNA SHAKE) liquid 237 mL  237 mL Oral BID BM Toribio Harbour, RD   237 mL at 02/10/14 1400  . furosemide (LASIX) tablet 80 mg  80 mg Oral BID Thurnell Lose, MD   80 mg at 02/11/14  0924  . hydrOXYzine (ATARAX/VISTARIL) tablet 25 mg  25 mg Oral TID PRN Ursula Alert, MD   25 mg at 02/10/14 1542  . lidocaine (LIDODERM) 5 % 2 patch  2 patch Transdermal Daily Ursula Alert, MD   2 patch at 02/11/14 0924  . magnesium hydroxide (MILK OF MAGNESIA) suspension 30 mL  30 mL Oral Daily PRN Clarene Reamer, MD   30 mL at 02/09/14 2119  . metoprolol tartrate (LOPRESSOR) tablet 25 mg  25 mg Oral BID Thurnell Lose, MD   25 mg at 02/11/14 0924  . mometasone-formoterol (DULERA) 100-5 MCG/ACT inhaler 2 puff  2 puff Inhalation BID Clarene Reamer, MD   2 puff at 02/11/14 0800  . nicotine (NICODERM CQ - dosed in mg/24 hours) patch 21 mg  21 mg Transdermal Daily Ursula Alert, MD   21 mg at 02/11/14 0926  . OLANZapine zydis (ZYPREXA) disintegrating tablet 5 mg  5 mg Oral BID PRN Ursula Alert, MD      . simvastatin (ZOCOR) tablet 10 mg  10 mg Oral QHS Mojeed Akintayo   10 mg at 02/10/14 2034  . temazepam (RESTORIL) capsule 7.5 mg  7.5 mg Oral QHS PRN Ursula Alert, MD      . ziprasidone (GEODON) capsule 120 mg  120 mg Oral QHS Mojeed Akintayo   120 mg at 02/10/14 2033    Lab Results:  Results for orders placed during the hospital encounter of 02/05/14 (from the past 48 hour(s))  GLUCOSE, CAPILLARY     Status: None   Collection Time    02/10/14  6:48 AM      Result Value Ref Range   Glucose-Capillary 72  70 - 99 mg/dL  GLUCOSE, CAPILLARY     Status: None   Collection Time    02/10/14 11:43 AM      Result Value Ref Range   Glucose-Capillary 99  70 - 99 mg/dL  GLUCOSE, CAPILLARY     Status: None   Collection Time    02/11/14  7:08 AM      Result Value Ref Range   Glucose-Capillary 76  70 - 99 mg/dL    Physical Findings: AIMS: Facial and Oral Movements Muscles of Facial Expression: None, normal Lips and Perioral Area: None, normal Jaw: None, normal Tongue: None, normal,Extremity Movements Upper (arms, wrists, hands, fingers): None, normal Lower (legs, knees, ankles,  toes): None, normal, Trunk Movements Neck, shoulders, hips: None, normal, Overall Severity Severity of abnormal movements (highest score from questions above): None, normal Incapacitation due to abnormal movements: None, normal Patient's awareness of abnormal movements (rate only patient's report): No Awareness, Dental Status Current problems with teeth and/or dentures?: No Does patient usually wear dentures?: No  CIWA:    COWS:      Assessment: Patient remains irritable, angry, but not threatening or disruptive. Denies hallucinations- has been taking medications with staff encouragement. Limited insight.  Treatment Plan Summary: Daily contact with patient to assess and  evaluate symptoms and progress in treatment Medication management See below   Plan:  Continue with plan of care Continue crisis management Encourage to participate in group and individual sessions Continue medication management/ and review as needed  For Bipolar disorder Reduce Depakote ER to 750 mg po qhs. Will reduce Restoril to 7.5 mg po prn for sleep ,since patient has been complaining that she overslept this AM. Geodon 120mg  daily at bedtime for mood stabilization. Will add vistaril prn for anxiety issues.  Chronic kidney disease stage V  - Appears to be at her baseline.  -Renal ultrasound showed no acute finding  -Continue with Lasix 80 mg oral 2 times a day  -Continue to follow with nephrology as an outpatient.   Essential hypertension  -Norvasc was changed to metoprolol.  -Acceptable, continue to monitor   Recent history of pneumonia.  -Once discharged must follow with PCP to get CBC and a 2 view chest x-ray rechecked. (per IM consult)  Anemia  -Stable, at her baseline, is most likely in the setting of anemia of chronic disease/chronic kidney disease, appears to be macrocyte take, and B12 level still pending, as well rest of anemia panel still pending, will continue to follow.     Discharge  Plan  in progress Address health issues /V/S as needed   Medical Decision Making Problem Points:  Established problem, improving (1), Review of last therapy session (1) and Review of psycho-social stressors (1) Data Points:  Review or order clinical lab tests (1) Review of medication regiment & side effects (2)  I certify that inpatient services furnished can reasonably be expected to improve the patient's condition.   Arabel Barcenas md ,02/11/2014,  11:48 AM

## 2014-02-11 NOTE — Tx Team (Signed)
  Interdisciplinary Treatment Plan Update   Date Reviewed:  02/11/2014  Time Reviewed:  11:03 AM  Progress in Treatment:   Attending groups: Yes Participating in groups: Yes Taking medication as prescribed: Yes  Tolerating medication: Yes Family/Significant other contact made: Yes  Patient understands diagnosis: Yes  Discussing patient identified problems/goals with staff: Yes  See initial care plan Medical problems stabilized or resolved: Yes Denies suicidal/homicidal ideation: Yes  In tx team Patient has not harmed self or others: Yes  For review of initial/current patient goals, please see plan of care.  Estimated Length of Stay:  Likely d/c tomorrow  Reason for Continuation of Hospitalization:   New Problems/Goals identified:  N/A  Discharge Plan or Barriers:   return home, follow up [facility] and [employer]  Additional Comments:  Attendees:  Signature: Steva Colder, MD 02/11/2014 11:03 AM   Signature: Ripley Fraise, LCSW 02/11/2014 11:03 AM  Signature: Elmarie Shiley, NP 02/11/2014 11:03 AM  Signature: Para March, RN 02/11/2014 11:03 AM  Signature:  02/11/2014 11:03 AM  Signature:  02/11/2014 11:03 AM  Signature:   02/11/2014 11:03 AM  Signature:    Signature:    Signature:    Signature:    Signature:    Signature:      Scribe for Treatment Team:   Ripley Fraise, LCSW  02/11/2014 11:03 AM

## 2014-02-12 LAB — GLUCOSE, CAPILLARY
Glucose-Capillary: 70 mg/dL (ref 70–99)
Glucose-Capillary: 81 mg/dL (ref 70–99)

## 2014-02-12 MED ORDER — MUSCLE RUB 10-15 % EX CREA
TOPICAL_CREAM | CUTANEOUS | Status: DC | PRN
  Administered 2014-02-12: 12:00:00 via TOPICAL
  Filled 2014-02-12: qty 85

## 2014-02-12 MED ORDER — METOPROLOL TARTRATE 25 MG PO TABS: 25.0000 mg | ORAL_TABLET | Freq: Two times a day (BID) | ORAL | Status: DC

## 2014-02-12 MED ORDER — FUROSEMIDE 80 MG PO TABS: 80.0000 mg | ORAL_TABLET | Freq: Two times a day (BID) | ORAL | Status: DC

## 2014-02-12 MED ORDER — DIVALPROEX SODIUM ER 250 MG PO TB24
750.0000 mg | ORAL_TABLET | Freq: Every day | ORAL | Status: DC
  Filled 2014-02-12: qty 42
  Filled 2014-02-12: qty 3

## 2014-02-12 MED ORDER — HYDROXYZINE HCL 50 MG PO TABS
50.0000 mg | ORAL_TABLET | Freq: Three times a day (TID) | ORAL | Status: DC | PRN
Start: 2014-02-12 — End: 2014-04-04

## 2014-02-12 MED ORDER — SIMVASTATIN 10 MG PO TABS: 10.0000 mg | ORAL_TABLET | Freq: Every day | ORAL | Status: DC

## 2014-02-12 MED ORDER — LORAZEPAM 2 MG PO TABS: 2.0000 mg | ORAL_TABLET | Freq: Four times a day (QID) | ORAL | Status: DC | PRN

## 2014-02-12 MED ORDER — TEMAZEPAM 7.5 MG PO CAPS: 7.5000 mg | ORAL_CAPSULE | Freq: Every evening | ORAL | Status: DC | PRN

## 2014-02-12 MED ORDER — FLUTICASONE-SALMETEROL 100-50 MCG/DOSE IN AEPB: 1.0000 | INHALATION_SPRAY | Freq: Two times a day (BID) | RESPIRATORY_TRACT | Status: DC

## 2014-02-12 MED ORDER — DIVALPROEX SODIUM ER 250 MG PO TB24: 750.0000 mg | ORAL_TABLET | Freq: Every day | ORAL | Status: DC

## 2014-02-12 MED ORDER — ZIPRASIDONE HCL 60 MG PO CAPS: 120.0000 mg | ORAL_CAPSULE | Freq: Every day | ORAL | Status: DC

## 2014-02-12 NOTE — Progress Notes (Signed)
[facility] Adult Case Management Discharge Plan :  Will you be returning to the same living situation after discharge: Yes,  home with daughter At discharge, do you have transportation home?:Yes,  cab Do you have the ability to pay for your medications:Yes,  MCD  Release of information consent forms completed and in the chart;  Patient's signature needed at discharge.  Patient to Follow up at: Follow-up Information   Follow up with [facility] On 02/17/2014. (Monday at 1:00 for your assessment for admission appointment)    Contact information:   [place]      Follow up with [place] On 02/19/2014. (Wednesday at 2:15 with Dr Darleene Cleaver.  Call to cancel if you decide to go with a different psychiatrist)    Contact information:   [address] [place] [address]      Patient denies SI/HI:   Yes,  yes    Safety Planning and Suicide Prevention discussed:  Yes,  yes  [name] 02/12/2014, 10:35 AM

## 2014-02-12 NOTE — Discharge Instructions (Signed)
*  See psychiatrist to have your Depakote level drawn in 1 week (02/19/2014).

## 2014-02-12 NOTE — [facility] Suicide Risk Assessment (Signed)
[facility] INPATIENT:  Family/Significant Other Suicide Prevention Education  Suicide Prevention Education:  Education Completed; Daughter Jennifer Lawson, [place] [address]  has been identified by the patient as the family member/significant other with whom the patient will be residing, and identified as the person(s) who will aid the patient in the event of a mental health crisis (suicidal ideations/suicide attempt).  With written consent from the patient, the family member/significant other has been provided the following suicide prevention education, prior to the and/or following the discharge of the patient.  The suicide prevention education provided includes the following:  Suicide risk factors  Suicide prevention and interventions  National Suicide Hotline telephone number  [facility] assessment telephone number  [facility] Emergency Assistance [place] and/or Residential Mobile Crisis Unit telephone number  Request made of family/significant other to:  Remove weapons (e.g., guns, rifles, knives), all items previously/currently identified as safety concern.    Remove drugs/medications (over-the-counter, prescriptions, illicit drugs), all items previously/currently identified as a safety concern.  The family member/significant other verbalizes understanding of the suicide prevention education information provided.  The family member/significant other agrees to remove the items of safety concern listed above.  [name] 02/12/2014, 10:33 AM

## 2014-02-12 NOTE — Progress Notes (Signed)
Patient asking all morning for discharge. Frequent somatic complaints. States "no one is helping me." Discharge arrangements made (see chart). Discharge teaching completed, Rx's given along with sample meds. Belongings returned. Pt verbalizes understanding. Pt denies SI/HI and is discharged in stable condition to daughter. Jennifer Lawson
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
## 2014-02-12 NOTE — Progress Notes (Signed)
Patient ID: Jennifer Lawson, female   DOB: [birth year], 53 y.o.   MRN: [id number] D: Client visible on the unit with many somatic complaints, continuously asks for oxygen concentrator but O2 Sat was 100%. Client was irritable, sullen, reports "I miss my children, miss my grandson" Client was asymptomatic, VS BP 124/85 P 79. A: Writer provided emotional support, reminded client that she said her daughter works maybe she was tired and would call her later. Reminded client not to transfer that anger towards staff. Staff will monitor q24min for safety. R: client is safe on the unit, takes medication after much hesitation.

## 2014-02-17 NOTE — Progress Notes (Signed)
Patient Discharge Instructions:  After Visit Summary (AVS):   Faxed to:  02/17/14 Psychiatric Admission Assessment Note:   Faxed to:  02/17/14 Suicide Risk Assessment - Discharge Assessment:   Faxed to:  02/17/14 Faxed/Sent to the Next Level Care provider:  02/17/14 Faxed to [facility] @ [phone] Faxed to [facility] @ [phone]  Patsey Berthold, 02/17/2014, 2:14 PM

## 2014-03-04 DIAGNOSIS — F603 Borderline personality disorder: Secondary | ICD-10-CM | POA: Diagnosis not present

## 2014-03-12 ENCOUNTER — Encounter (HOSPITAL_COMMUNITY): Payer: Self-pay | Admitting: *Deleted

## 2014-03-12 ENCOUNTER — Emergency Department (HOSPITAL_COMMUNITY)
Admission: EM | Admit: 2014-03-12 | Discharge: 2014-03-12 | Discharge status: Against Medical Advice | Payer: PRIVATE HEALTH INSURANCE | Attending: Emergency Medicine | Admitting: Emergency Medicine

## 2014-03-12 DIAGNOSIS — N189 Chronic kidney disease, unspecified: Secondary | ICD-10-CM | POA: Diagnosis not present

## 2014-03-12 DIAGNOSIS — R479 Unspecified speech disturbances: Secondary | ICD-10-CM | POA: Insufficient documentation

## 2014-03-12 DIAGNOSIS — Z720 Tobacco use: Secondary | ICD-10-CM | POA: Diagnosis not present

## 2014-03-12 DIAGNOSIS — R400 Somnolence: Secondary | ICD-10-CM | POA: Insufficient documentation

## 2014-03-12 DIAGNOSIS — J449 Chronic obstructive pulmonary disease, unspecified: Secondary | ICD-10-CM | POA: Insufficient documentation

## 2014-03-12 DIAGNOSIS — I129 Hypertensive chronic kidney disease with stage 1 through stage 4 chronic kidney disease, or unspecified chronic kidney disease: Secondary | ICD-10-CM | POA: Insufficient documentation

## 2014-03-12 DIAGNOSIS — R011 Cardiac murmur, unspecified: Secondary | ICD-10-CM | POA: Insufficient documentation

## 2014-03-12 NOTE — ED Notes (Signed)
Unable to locate patient, pager found in lobby

## 2014-03-12 NOTE — ED Notes (Signed)
Pt put her coat on and went out to the waiting room

## 2014-03-12 NOTE — ED Notes (Signed)
The pt  Reports that she iks leaving and is going to call her daughter

## 2014-03-12 NOTE — ED Notes (Signed)
The pt is slurring hger words.  i have no idea why she here.  She is very drowsy..  She reports that she came because she was having problems with her kidneys but not any more.  She wants to leave

## 2014-03-21 ENCOUNTER — Other Ambulatory Visit: Payer: Self-pay | Admitting: Internal Medicine

## 2014-03-25 DIAGNOSIS — F603 Borderline personality disorder: Secondary | ICD-10-CM | POA: Diagnosis not present

## 2014-03-30 ENCOUNTER — Encounter (HOSPITAL_COMMUNITY): Payer: Self-pay | Admitting: Emergency Medicine

## 2014-03-30 ENCOUNTER — Emergency Department (EMERGENCY_DEPARTMENT_HOSPITAL)
Admission: EM | Admit: 2014-03-30 | Discharge: 2014-04-01 | Disposition: A | Discharge status: Other Acute Inpatient Hospital | Payer: 59 | Source: Home / Self Care | Attending: Emergency Medicine | Admitting: Emergency Medicine

## 2014-03-30 ENCOUNTER — Ambulatory Visit (HOSPITAL_COMMUNITY)
Admission: RE | Admit: 2014-03-30 | Discharge: 2014-03-30 | Disposition: A | Discharge status: Home / Self Care | Payer: 59 | Attending: Psychiatry | Admitting: Psychiatry

## 2014-03-30 DIAGNOSIS — R4689 Other symptoms and signs involving appearance and behavior: Secondary | ICD-10-CM | POA: Diagnosis present

## 2014-03-30 DIAGNOSIS — F29 Unspecified psychosis not due to a substance or known physiological condition: Secondary | ICD-10-CM | POA: Diagnosis not present

## 2014-03-30 DIAGNOSIS — F329 Major depressive disorder, single episode, unspecified: Secondary | ICD-10-CM | POA: Diagnosis not present

## 2014-03-30 DIAGNOSIS — Z720 Tobacco use: Secondary | ICD-10-CM

## 2014-03-30 DIAGNOSIS — E119 Type 2 diabetes mellitus without complications: Secondary | ICD-10-CM | POA: Insufficient documentation

## 2014-03-30 DIAGNOSIS — I129 Hypertensive chronic kidney disease with stage 1 through stage 4 chronic kidney disease, or unspecified chronic kidney disease: Secondary | ICD-10-CM

## 2014-03-30 DIAGNOSIS — G40909 Epilepsy, unspecified, not intractable, without status epilepticus: Secondary | ICD-10-CM | POA: Insufficient documentation

## 2014-03-30 DIAGNOSIS — F99 Mental disorder, not otherwise specified: Secondary | ICD-10-CM | POA: Insufficient documentation

## 2014-03-30 DIAGNOSIS — R011 Cardiac murmur, unspecified: Secondary | ICD-10-CM | POA: Insufficient documentation

## 2014-03-30 DIAGNOSIS — N189 Chronic kidney disease, unspecified: Secondary | ICD-10-CM

## 2014-03-30 DIAGNOSIS — Z8719 Personal history of other diseases of the digestive system: Secondary | ICD-10-CM

## 2014-03-30 DIAGNOSIS — R569 Unspecified convulsions: Secondary | ICD-10-CM | POA: Diagnosis not present

## 2014-03-30 DIAGNOSIS — Z79899 Other long term (current) drug therapy: Secondary | ICD-10-CM

## 2014-03-30 DIAGNOSIS — Z7951 Long term (current) use of inhaled steroids: Secondary | ICD-10-CM | POA: Insufficient documentation

## 2014-03-30 DIAGNOSIS — F4310 Post-traumatic stress disorder, unspecified: Secondary | ICD-10-CM | POA: Diagnosis not present

## 2014-03-30 DIAGNOSIS — E785 Hyperlipidemia, unspecified: Secondary | ICD-10-CM | POA: Diagnosis present

## 2014-03-30 DIAGNOSIS — F319 Bipolar disorder, unspecified: Secondary | ICD-10-CM | POA: Insufficient documentation

## 2014-03-30 DIAGNOSIS — I10 Essential (primary) hypertension: Secondary | ICD-10-CM | POA: Insufficient documentation

## 2014-03-30 DIAGNOSIS — F314 Bipolar disorder, current episode depressed, severe, without psychotic features: Principal | ICD-10-CM | POA: Diagnosis present

## 2014-03-30 DIAGNOSIS — R410 Disorientation, unspecified: Secondary | ICD-10-CM | POA: Diagnosis present

## 2014-03-30 DIAGNOSIS — F3110 Bipolar disorder, current episode manic without psychotic features, unspecified: Secondary | ICD-10-CM

## 2014-03-30 DIAGNOSIS — F05 Delirium due to known physiological condition: Secondary | ICD-10-CM | POA: Diagnosis present

## 2014-03-30 DIAGNOSIS — J449 Chronic obstructive pulmonary disease, unspecified: Secondary | ICD-10-CM | POA: Diagnosis present

## 2014-03-30 LAB — COMPREHENSIVE METABOLIC PANEL
ALT: 6 U/L (ref 0–35)
AST: 10 U/L (ref 0–37)
Albumin: 3.4 g/dL — ABNORMAL LOW (ref 3.5–5.2)
Alkaline Phosphatase: 62 U/L (ref 39–117)
Anion gap: 12 (ref 5–15)
BUN: 37 mg/dL — ABNORMAL HIGH (ref 6–23)
CO2: 20 mEq/L (ref 19–32)
Calcium: 10.6 mg/dL — ABNORMAL HIGH (ref 8.4–10.5)
Chloride: 110 mEq/L (ref 96–112)
Creatinine, Ser: 3.68 mg/dL — ABNORMAL HIGH (ref 0.50–1.10)
GFR calc Af Amer: 15 mL/min — ABNORMAL LOW (ref >90)
GFR calc non Af Amer: 13 mL/min — ABNORMAL LOW (ref >90)
Glucose, Bld: 78 mg/dL (ref 70–99)
Potassium: 5.3 mEq/L (ref 3.7–5.3)
Sodium: 142 mEq/L (ref 137–147)
Total Bilirubin: <0.2 mg/dL — ABNORMAL LOW (ref 0.3–1.2)
Total Protein: 7.5 g/dL (ref 6.0–8.3)

## 2014-03-30 LAB — URINALYSIS, ROUTINE W REFLEX MICROSCOPIC
Bilirubin Urine: NEGATIVE
Glucose, UA: NEGATIVE mg/dL
Hgb urine dipstick: NEGATIVE
Ketones, ur: NEGATIVE mg/dL
Leukocytes, UA: NEGATIVE
Nitrite: NEGATIVE
Protein, ur: >300 mg/dL — AB
Specific Gravity, Urine: 1.007 (ref 1.005–1.030)
Urobilinogen, UA: 0.2 mg/dL (ref 0.0–1.0)
pH: 6.5 (ref 5.0–8.0)

## 2014-03-30 LAB — CBC WITH DIFFERENTIAL/PLATELET
Basophils Absolute: 0.1 10*3/uL (ref 0.0–0.1)
Basophils Relative: 1 % (ref 0–1)
Eosinophils Absolute: 0.2 10*3/uL (ref 0.0–0.7)
Eosinophils Relative: 3 % (ref 0–5)
HCT: 33.1 % — ABNORMAL LOW (ref 36.0–46.0)
Hemoglobin: 10 g/dL — ABNORMAL LOW (ref 12.0–15.0)
Lymphocytes Relative: 38 % (ref 12–46)
Lymphs Abs: 1.8 10*3/uL (ref 0.7–4.0)
MCH: 30.7 pg (ref 26.0–34.0)
MCHC: 30.2 g/dL (ref 30.0–36.0)
MCV: 101.5 fL — ABNORMAL HIGH (ref 78.0–100.0)
Monocytes Absolute: 0.5 10*3/uL (ref 0.1–1.0)
Monocytes Relative: 10 % (ref 3–12)
Neutro Abs: 2.3 10*3/uL (ref 1.7–7.7)
Neutrophils Relative %: 48 % (ref 43–77)
Platelets: 174 10*3/uL (ref 150–400)
RBC: 3.26 MIL/uL — ABNORMAL LOW (ref 3.87–5.11)
RDW: 15.5 % (ref 11.5–15.5)
WBC: 4.8 10*3/uL (ref 4.0–10.5)

## 2014-03-30 LAB — VALPROIC ACID LEVEL: Valproic Acid Lvl: <10 ug/mL — ABNORMAL LOW (ref 50.0–100.0)

## 2014-03-30 LAB — URINE MICROSCOPIC-ADD ON: Urine-Other: NONE SEEN

## 2014-03-30 MED ORDER — FUROSEMIDE 80 MG PO TABS
80.0000 mg | ORAL_TABLET | Freq: Two times a day (BID) | ORAL | Status: DC
  Administered 2014-03-31 – 2014-04-01 (×4): 80 mg via ORAL
  Filled 2014-03-30 (×5): qty 1

## 2014-03-30 MED ORDER — MOMETASONE FURO-FORMOTEROL FUM 100-5 MCG/ACT IN AERO
2.0000 | INHALATION_SPRAY | Freq: Two times a day (BID) | RESPIRATORY_TRACT | Status: DC
  Administered 2014-03-30 – 2014-04-01 (×5): 2 via RESPIRATORY_TRACT
  Filled 2014-03-30: qty 8.8

## 2014-03-30 MED ORDER — DIVALPROEX SODIUM ER 500 MG PO TB24
750.0000 mg | ORAL_TABLET | Freq: Every day | ORAL | Status: DC
  Administered 2014-03-30 – 2014-04-01 (×3): 750 mg via ORAL
  Filled 2014-03-30 (×3): qty 1

## 2014-03-30 MED ORDER — ZIPRASIDONE MESYLATE 20 MG IM SOLR: 20.0000 mg | Freq: Once | INTRAMUSCULAR | Status: DC

## 2014-03-30 MED ORDER — TEMAZEPAM 7.5 MG PO CAPS
7.5000 mg | ORAL_CAPSULE | Freq: Every evening | ORAL | Status: DC | PRN
  Administered 2014-03-31: 7.5 mg via ORAL
  Filled 2014-03-30: qty 1

## 2014-03-30 MED ORDER — SIMVASTATIN 10 MG PO TABS
10.0000 mg | ORAL_TABLET | Freq: Every day | ORAL | Status: DC
  Administered 2014-03-30 – 2014-04-01 (×3): 10 mg via ORAL
  Filled 2014-03-30 (×3): qty 1

## 2014-03-30 MED ORDER — ZIPRASIDONE MESYLATE 20 MG IM SOLR
10.0000 mg | Freq: Once | INTRAMUSCULAR | Status: AC
  Administered 2014-03-30: 10 mg via INTRAMUSCULAR
  Filled 2014-03-30: qty 20

## 2014-03-30 MED ORDER — LORAZEPAM 1 MG PO TABS
1.0000 mg | ORAL_TABLET | Freq: Three times a day (TID) | ORAL | Status: DC | PRN
  Administered 2014-04-01: 1 mg via ORAL

## 2014-03-30 MED ORDER — ACETAMINOPHEN 325 MG PO TABS
650.0000 mg | ORAL_TABLET | ORAL | Status: DC | PRN
  Administered 2014-03-31 (×2): 650 mg via ORAL
  Filled 2014-03-30 (×2): qty 2

## 2014-03-30 MED ORDER — ALBUTEROL SULFATE (2.5 MG/3ML) 0.083% IN NEBU
2.5000 mg | INHALATION_SOLUTION | RESPIRATORY_TRACT | Status: DC | PRN
  Administered 2014-03-31: 2.5 mg via RESPIRATORY_TRACT
  Filled 2014-03-30 (×3): qty 3

## 2014-03-30 MED ORDER — ONDANSETRON HCL 4 MG PO TABS: 4.0000 mg | ORAL_TABLET | Freq: Three times a day (TID) | ORAL | Status: DC | PRN

## 2014-03-30 MED ORDER — ZIPRASIDONE HCL 20 MG PO CAPS
120.0000 mg | ORAL_CAPSULE | Freq: Every day | ORAL | Status: DC
  Administered 2014-03-30 – 2014-03-31 (×2): 120 mg via ORAL
  Filled 2014-03-30 (×2): qty 6

## 2014-03-30 MED ORDER — HYDROXYZINE HCL 25 MG PO TABS
50.0000 mg | ORAL_TABLET | Freq: Three times a day (TID) | ORAL | Status: DC | PRN
  Administered 2014-03-30 – 2014-04-01 (×2): 50 mg via ORAL
  Filled 2014-03-30 (×2): qty 2

## 2014-03-30 MED ORDER — LORAZEPAM 1 MG PO TABS
2.0000 mg | ORAL_TABLET | Freq: Four times a day (QID) | ORAL | Status: DC | PRN
  Administered 2014-03-30 – 2014-03-31 (×3): 2 mg via ORAL
  Filled 2014-03-30 (×4): qty 2

## 2014-03-30 MED ORDER — LORAZEPAM 2 MG/ML IJ SOLN
2.0000 mg | Freq: Once | INTRAMUSCULAR | Status: AC
  Administered 2014-03-30: 2 mg via INTRAMUSCULAR
  Filled 2014-03-30: qty 1

## 2014-03-30 MED ORDER — NICOTINE 21 MG/24HR TD PT24
21.0000 mg | MEDICATED_PATCH | Freq: Every day | TRANSDERMAL | Status: DC
  Administered 2014-03-30 – 2014-04-01 (×2): 21 mg via TRANSDERMAL
  Filled 2014-03-30 (×3): qty 1

## 2014-03-30 MED ORDER — METOPROLOL TARTRATE 25 MG PO TABS
25.0000 mg | ORAL_TABLET | Freq: Two times a day (BID) | ORAL | Status: DC
  Administered 2014-03-30 – 2014-04-01 (×5): 25 mg via ORAL
  Filled 2014-03-30 (×5): qty 1
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
## 2014-03-30 NOTE — ED Notes (Signed)
Report given to [facility], pt to go to room 36.

## 2014-03-30 NOTE — ED Notes (Signed)
On the phone 

## 2014-03-30 NOTE — ED Notes (Signed)
Report received from [place]. Pt. Alert and oriented in no distress denies SI, HI, AVH and pain. Will continue to monitor for safety. Pt. Instructed to come to me with problems or concerns. Q 15 minute checks continue. Pt. Initially at nurses station yelling, not responding to redirection. Security called and escorted patient back to her room.

## 2014-03-30 NOTE — ED Notes (Signed)
Pt at the NS, Pt difficult to redirect, began to yell and curse at staff and refused to go to back to her room.  Security in to assist

## 2014-03-30 NOTE — ED Notes (Signed)
Bed: WA16 Expected date:  Expected time:  Means of arrival:  Comments: Hold 

## 2014-03-30 NOTE — ED Notes (Signed)
Pt ambulatory from acute care. Sat in day room declining to go to her room,  yelling, angry, not directable.  Pt yelling at staff, but seems to respond better to [facility].  Support given.

## 2014-03-30 NOTE — ED Notes (Signed)
Dr Kathrynn Humble aware of BUN and Creatine levels, although elevated they are at baseline for pt.

## 2014-03-30 NOTE — ED Notes (Signed)
Pt walked out in hall yelling and screaming, escorted back to room. Pt continues to be loud, calling staff names.

## 2014-03-30 NOTE — ED Provider Notes (Signed)
CSN: [id number]     Arrival date & time 03/30/14  1055 History   First MD Initiated Contact with Patient 03/30/14 1101     Chief Complaint  Patient presents with  . Medical Clearance     (Consider location/radiation/quality/duration/timing/severity/associated sxs/prior Treatment) HPI Comments: Pt brought to the ER with cc of worsening mental status. Pt has hx of CKD, bipolar disorder. Per daughter, pt went throught med wash out, as she was extremely non compliant with her meds, and started on depakote and an IM med. Pt however has continued to decline. She is agitated, not taking care of herself,start laughing all of a sudden, very labile and explosice with her temper. Pt has no complains. She is accusing daughter of not feeding her, and not taking care of her properly.  The history is provided by the patient and a relative.    Past Medical History  Diagnosis Date  . Mental disorder   . Depression   . Hypertension   . Overdose   . Tobacco use disorder 11/13/2012  . Complication of anesthesia     difficulty going to sleep  . Chronic kidney disease     06/11/13- not on dialysis  . Shortness of breath     lying down flat  . PTSD (post-traumatic stress disorder)   . Asthma   . COPD (chronic obstructive pulmonary disease)   . Heart murmur   . GERD (gastroesophageal reflux disease)   . Seizures     "passsed out"  . History of blood transfusion   . Diabetes mellitus without complication     denies   Past Surgical History  Procedure Laterality Date  . Right knee replacement      she says it was last year.  . Esophagogastroduodenoscopy Left 11/14/2012    Procedure: ESOPHAGOGASTRODUODENOSCOPY (EGD);  Surgeon: [name], MD;  [facility];  Service: Endoscopy;  Laterality: Left;  . Joint replacement Right     knee  . Parathyroidectomy    . Av fistula placement Right 06/12/2013    Procedure: ARTERIOVENOUS (AV) FISTULA CREATION; RIGHT  BASILIC VEIN TRANSPOSITION with  Intraoperative ultrasound;  Surgeon: Mal [name], MD;  Location: [facility] OR;  Service: Vascular;  Laterality: Right;   Family History  Problem Relation Age of Onset  . Diabetes Mother   . Hyperlipidemia Mother   . Hypertension Mother   . Diabetes Father   . Hypertension Father   . Hyperlipidemia Father    History  Substance Use Topics  . Smoking status: Current Every Day Smoker -- 2.00 packs/day for 40 years    Types: Cigarettes, Cigars  . Smokeless tobacco: Never Used  . Alcohol Use: No     Comment: none in over a month per daughter   OB History    No data available     Review of Systems  Constitutional: Positive for activity change.  Respiratory: Negative for shortness of breath.   Cardiovascular: Negative for chest pain.  Gastrointestinal: Negative for nausea, vomiting and abdominal pain.  Genitourinary: Negative for dysuria.  Musculoskeletal: Negative for neck pain.  Neurological: Negative for headaches.  Psychiatric/Behavioral: Positive for behavioral problems and agitation. Negative for suicidal ideas.      Allergies  Codeine sulfate; Haldol; Risperidone and related; Gabapentin; and Trazodone and nefazodone  Home Medications   Prior to Admission medications   Medication Sig Start Date End Date Taking? Authorizing Provider  divalproex (DEPAKOTE ER) 250 MG 24 hr tablet Take 3 tablets (750 mg total) by mouth  at bedtime. 02/12/14  Yes Benjamine Mola, FNP  Fluticasone-Salmeterol (ADVAIR) 100-50 MCG/DOSE AEPB Inhale 1 puff into the lungs 2 (two) times daily. 02/12/14  Yes Benjamine Mola, FNP  furosemide (LASIX) 80 MG tablet Take 1 tablet (80 mg total) by mouth 2 (two) times daily. 02/12/14  Yes John C Withrow, FNP  INVEGA SUSTENNA 156 MG/ML SUSP Take 156 mg by mouth every 30 (thirty) days.  03/25/14  Yes Historical Provider, MD  metoprolol tartrate (LOPRESSOR) 25 MG tablet Take 1 tablet (25 mg total) by mouth 2 (two) times daily. 02/12/14  Yes Benjamine Mola, FNP  albuterol  (PROVENTIL) (2.5 MG/3ML) 0.083% nebulizer solution Take 3 mLs (2.5 mg total) by nebulization every 4 (four) hours as needed for wheezing or shortness of breath. Patient not taking: Reported on 03/30/2014 01/22/14   Jonetta Osgood, MD  hydrOXYzine (ATARAX/VISTARIL) 50 MG tablet Take 1 tablet (50 mg total) by mouth 3 (three) times daily as needed for anxiety. Patient not taking: Reported on 03/30/2014 02/12/14   Benjamine Mola, FNP  LORazepam (ATIVAN) 2 MG tablet Take 1 tablet (2 mg total) by mouth every 6 (six) hours as needed (severe anxiety). Patient not taking: Reported on 03/30/2014 02/12/14   Benjamine Mola, FNP  simvastatin (ZOCOR) 10 MG tablet Take 1 tablet (10 mg total) by mouth at bedtime. Patient not taking: Reported on 03/30/2014 02/12/14   Benjamine Mola, FNP  temazepam (RESTORIL) 7.5 MG capsule Take 1 capsule (7.5 mg total) by mouth at bedtime as needed for sleep. Patient not taking: Reported on 03/30/2014 02/12/14   Benjamine Mola, FNP  ziprasidone (GEODON) 60 MG capsule Take 2 capsules (120 mg total) by mouth at bedtime. Patient not taking: Reported on 03/30/2014 02/12/14   Benjamine Mola, FNP   BP 144/91 mmHg  Pulse 86  Temp(Src) 97.8 F (36.6 C) (Oral)  Resp 12  SpO2 100%  LMP 05/09/2008 Physical Exam  Constitutional: She is oriented to person, place, and time. She appears well-developed and well-nourished.  HENT:  Head: Normocephalic and atraumatic.  Eyes: EOM are normal. Pupils are equal, round, and reactive to light.  Neck: Neck supple.  Cardiovascular: Normal rate, regular rhythm and normal heart sounds.   No murmur heard. Pulmonary/Chest: Effort normal. No respiratory distress.  Abdominal: Soft. She exhibits no distension. There is no tenderness. There is no rebound and no guarding.  Neurological: She is alert and oriented to person, place, and time.  Skin: Skin is warm and dry.  Psychiatric:  Agitated patient, with flat affect  Nursing note and vitals  reviewed.   ED Course  Procedures (including critical care time) Labs Review Labs Reviewed  CBC WITH DIFFERENTIAL - Abnormal; Notable for the following:    RBC 3.26 (*)    Hemoglobin 10.0 (*)    HCT 33.1 (*)    MCV 101.5 (*)    All other components within normal limits  COMPREHENSIVE METABOLIC PANEL - Abnormal; Notable for the following:    BUN 37 (*)    Creatinine, Ser 3.68 (*)    Calcium 10.6 (*)    Albumin 3.4 (*)    Total Bilirubin <0.2 (*)    GFR calc non Af Amer 13 (*)    GFR calc Af Amer 15 (*)    All other components within normal limits  URINALYSIS, ROUTINE W REFLEX MICROSCOPIC - Abnormal; Notable for the following:    Protein, ur >300 (*)    All other components within normal  limits  VALPROIC ACID LEVEL - Abnormal; Notable for the following:    Valproic Acid Lvl <10.0 (*)    All other components within normal limits  URINE MICROSCOPIC-ADD ON    Imaging Review No results found.   EKG Interpretation   Date/Time:  Sunday March 30 2014 12:08:00 EST Ventricular Rate:  79 PR Interval:  213 QRS Duration: 98 QT Interval:  370 QTC Calculation: 424 R Axis:   0 Text Interpretation:  Sinus rhythm Prolonged PR interval Anterior infarct,  old Nonspecific ST and T wave abnormality Confirmed by Kathrynn Humble, MD, Thelma Comp  [id number]) on 03/30/2014 12:18:21 PM      MDM   Final diagnoses:  Bipolar disorder, manic   DDx: Depression Bipolar disorder Schizophrenia Substance abuse Suicidal ideation Acute withdrawal  Pt comes in with cc of agitation and mental status changes. She is not sleeping well. She is verbally and physically abusive, and appears to have decompensated psych status. IVC paperwork completed. Pt to be seen by TTS. Medically cleared- labs show CKD.  Varney Biles, MD 03/30/14 [phone]

## 2014-03-30 NOTE — ED Notes (Signed)
Daughter's number and address added to pt's chart under emergency contact. Story Conti [phone]
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
## 2014-03-30 NOTE — ED Notes (Signed)
Pt moved to Room 16 after writer speaking with Dr Kathrynn Humble who is preceeding with IVC paperwork, Ativan 2 mg IM ordered and given. Pt has outburst at intervals of calling staff names and lashing out verbally. Pt given warm blankets and pillow for comfort.

## 2014-03-31 DIAGNOSIS — F29 Unspecified psychosis not due to a substance or known physiological condition: Secondary | ICD-10-CM

## 2014-03-31 DIAGNOSIS — F332 Major depressive disorder, recurrent severe without psychotic features: Secondary | ICD-10-CM

## 2014-03-31 DIAGNOSIS — F4310 Post-traumatic stress disorder, unspecified: Secondary | ICD-10-CM

## 2014-03-31 NOTE — ED Notes (Signed)
Pt resting at present, pending neb treatment, no distress noted.  Will continue to monitor for safety.

## 2014-03-31 NOTE — Progress Notes (Signed)
MHT initiated psychiatric placement at the following facilities with bed availability:  AT [facility] 1)[facility] 2)[facility] 3)[facility] 4)[facility] 6)[facility] 9)[facility] 10)[facility] 12)[facility] 13)[facility]  FAXED REFERRALS 1)[facility] 2)[facility] 3)[facility] Mood, [facility]/NS
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
## 2014-03-31 NOTE — Progress Notes (Signed)
03/31/14 Below are a list of therapeutic placement options that were contacted to see if any bed were open.   Accepting referrals [facility] accepting referrals  [facility] accepting referrals per [facility] Accepting referrals per [facility]- Fax referrals may have an opening later per [place] Accepting pts. Per [facility] Accepting referrals please fax per [facility] Accepting referrals please fax per [facility]  [place]- no high acuity, adolescents or substance abuse  [facility]-Per [facility] adult M, F low acuity, no detox [facility] info [phone] Per [facility] [place] [facility]-per [place]-can fax over-possible discharges [place]-per [place] send referrals [facility] accepting referrals at capacity today but may change [facility] per [facility]- 2 beds available please fax referrals  ? [facility] [place] per [facility] Pinks per [place] per [facility] per [place] at capacity [facility] capacity for adult and C/[facility] [place]-per [place], but try again this evening [employer], no available beds [facility]- no beds per [place] Unable to reach [facility]-Unable to reach [place]- busy tone  [place] left VM  [facility].- # not in service  [place]- left VM  Bedelia Person, M.S., LPCA, [facility] Licensed Professional Counselor Associate  Triage Specialist  [facility]  Therapeutic Triage Services Phone: [phone] Fax: [phone]

## 2014-03-31 NOTE — ED Notes (Signed)
Respiratory therapy contacted for PRN neb treatment. Pt now stating she feels tight and she can't breathe, skin color good, resp even and unlabored.  Resp recontacted on ext [phone], states they will be here as soon as they can.
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
## 2014-04-01 ENCOUNTER — Inpatient Hospital Stay (HOSPITAL_COMMUNITY)
Admission: AD | Admit: 2014-04-01 | Discharge: 2014-04-03 | DRG: 885 | Disposition: A | Discharge status: Home / Self Care | Payer: 59 | Attending: Psychiatry | Admitting: Psychiatry

## 2014-04-01 ENCOUNTER — Encounter (HOSPITAL_COMMUNITY): Payer: Self-pay

## 2014-04-01 DIAGNOSIS — F314 Bipolar disorder, current episode depressed, severe, without psychotic features: Secondary | ICD-10-CM | POA: Diagnosis present

## 2014-04-01 DIAGNOSIS — E785 Hyperlipidemia, unspecified: Secondary | ICD-10-CM | POA: Diagnosis present

## 2014-04-01 DIAGNOSIS — R410 Disorientation, unspecified: Secondary | ICD-10-CM | POA: Diagnosis present

## 2014-04-01 DIAGNOSIS — J449 Chronic obstructive pulmonary disease, unspecified: Secondary | ICD-10-CM | POA: Diagnosis present

## 2014-04-01 DIAGNOSIS — F05 Delirium due to known physiological condition: Secondary | ICD-10-CM | POA: Diagnosis present

## 2014-04-01 DIAGNOSIS — Z79899 Other long term (current) drug therapy: Secondary | ICD-10-CM | POA: Diagnosis not present

## 2014-04-01 DIAGNOSIS — E119 Type 2 diabetes mellitus without complications: Secondary | ICD-10-CM | POA: Diagnosis present

## 2014-04-01 MED ORDER — MAGNESIUM HYDROXIDE 400 MG/5ML PO SUSP: 30.0000 mL | Freq: Every day | ORAL | Status: DC | PRN

## 2014-04-01 MED ORDER — MOMETASONE FURO-FORMOTEROL FUM 100-5 MCG/ACT IN AERO
2.0000 | INHALATION_SPRAY | Freq: Two times a day (BID) | RESPIRATORY_TRACT | Status: DC
  Administered 2014-04-01 – 2014-04-02 (×3): 2 via RESPIRATORY_TRACT
  Filled 2014-04-01 (×2): qty 8.8

## 2014-04-01 MED ORDER — FUROSEMIDE 40 MG PO TABS
80.0000 mg | ORAL_TABLET | Freq: Two times a day (BID) | ORAL | Status: DC
  Administered 2014-04-02 – 2014-04-03 (×3): 80 mg via ORAL
  Filled 2014-04-01 (×3): qty 16
  Filled 2014-04-01 (×2): qty 1
  Filled 2014-04-01: qty 16
  Filled 2014-04-01 (×3): qty 1

## 2014-04-01 MED ORDER — DIVALPROEX SODIUM ER 500 MG PO TB24
750.0000 mg | ORAL_TABLET | Freq: Every day | ORAL | Status: DC
  Administered 2014-04-01 – 2014-04-02 (×2): 750 mg via ORAL
  Filled 2014-04-01 (×6): qty 1

## 2014-04-01 MED ORDER — ACETAMINOPHEN 325 MG PO TABS
650.0000 mg | ORAL_TABLET | Freq: Four times a day (QID) | ORAL | Status: DC | PRN
  Administered 2014-04-02 – 2014-04-03 (×2): 650 mg via ORAL
  Filled 2014-04-01 (×2): qty 2

## 2014-04-01 MED ORDER — HYDROXYZINE HCL 50 MG PO TABS
50.0000 mg | ORAL_TABLET | Freq: Three times a day (TID) | ORAL | Status: DC | PRN
  Administered 2014-04-02: 50 mg via ORAL
  Filled 2014-04-01 (×3): qty 1

## 2014-04-01 MED ORDER — ZIPRASIDONE HCL 60 MG PO CAPS
120.0000 mg | ORAL_CAPSULE | Freq: Every day | ORAL | Status: DC
  Administered 2014-04-01: 120 mg via ORAL
  Filled 2014-04-01 (×2): qty 2
  Filled 2014-04-01: qty 6

## 2014-04-01 MED ORDER — ALBUTEROL SULFATE (2.5 MG/3ML) 0.083% IN NEBU: 2.5000 mg | INHALATION_SOLUTION | RESPIRATORY_TRACT | Status: DC | PRN

## 2014-04-01 MED ORDER — LUBIPROSTONE 24 MCG PO CAPS: 24.0000 ug | ORAL_CAPSULE | Freq: Two times a day (BID) | ORAL | Status: DC

## 2014-04-01 MED ORDER — DOCUSATE SODIUM 100 MG PO CAPS
100.0000 mg | ORAL_CAPSULE | Freq: Two times a day (BID) | ORAL | Status: DC
  Administered 2014-04-01 (×2): 100 mg via ORAL
  Filled 2014-04-01 (×2): qty 1

## 2014-04-01 MED ORDER — TEMAZEPAM 7.5 MG PO CAPS
7.5000 mg | ORAL_CAPSULE | Freq: Every evening | ORAL | Status: DC | PRN
  Administered 2014-04-01: 7.5 mg via ORAL
  Filled 2014-04-01: qty 1

## 2014-04-01 MED ORDER — METOPROLOL TARTRATE 25 MG PO TABS
25.0000 mg | ORAL_TABLET | Freq: Two times a day (BID) | ORAL | Status: DC
  Administered 2014-04-02 – 2014-04-03 (×3): 25 mg via ORAL
  Filled 2014-04-01: qty 8
  Filled 2014-04-01 (×3): qty 1
  Filled 2014-04-01: qty 8
  Filled 2014-04-01 (×2): qty 1
  Filled 2014-04-01 (×2): qty 8
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
## 2014-04-01 NOTE — Progress Notes (Signed)
Writer spoke to [facility] Otila Kluver from [facility]), pt to be transferred to [facility] 500 hall (503-bed 2) at 2000. Will inform oncoming shift about transfer. Q 15 minutes checks maintained as ordered for safety.

## 2014-04-01 NOTE — ED Notes (Signed)
Patient calm, cooperative at present. Denies SI, HI, AVH. Rates anxiety 7/10, feelings of depression 10/10.   Encouragement offered. Snack given. Dulera given.  Q 15 safety checks continue.

## 2014-04-01 NOTE — ED Provider Notes (Signed)
Patient has been seen and evaluated by psychiatry. They have cleared her from a psychiatric standpoint. I do not feel that she is a danger to herself or others. They've recommended discharge. They were concerned about the patient's pain. I did reevaluate the patient. She is not currently having any pain. She does have a history of chronic pain problems, seen frequently in the hospital and ER for these problems. Pain that she was expecting earlier was similar. Her abdominal exam is benign. Neurologic exam is normal. Patient does not require any further intervention.  Orpah Greek, MD 04/01/14 [phone]

## 2014-04-01 NOTE — Discharge Instructions (Signed)
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
## 2014-04-01 NOTE — Progress Notes (Signed)
Pt will be going to [facility] hall at 2000. IVC paperwork is complete and faxed to [facility].   Bedelia Person, M.S., LPCA, [facility] Licensed Professional Counselor Associate  Triage Specialist  [facility]  Therapeutic Triage Services Phone: [phone] Fax: [phone].

## 2014-04-02 ENCOUNTER — Encounter (HOSPITAL_COMMUNITY): Payer: Self-pay | Admitting: Psychiatry

## 2014-04-02 DIAGNOSIS — F4310 Post-traumatic stress disorder, unspecified: Secondary | ICD-10-CM

## 2014-04-02 DIAGNOSIS — F1099 Alcohol use, unspecified with unspecified alcohol-induced disorder: Secondary | ICD-10-CM

## 2014-04-02 DIAGNOSIS — Z720 Tobacco use: Secondary | ICD-10-CM

## 2014-04-02 DIAGNOSIS — F3160 Bipolar disorder, current episode mixed, unspecified: Secondary | ICD-10-CM

## 2014-04-02 DIAGNOSIS — Z9114 Patient's other noncompliance with medication regimen: Secondary | ICD-10-CM

## 2014-04-02 DIAGNOSIS — F19921 Other psychoactive substance use, unspecified with intoxication with delirium: Secondary | ICD-10-CM

## 2014-04-02 LAB — ETHANOL: Alcohol, Ethyl (B): <11 mg/dL (ref 0–11)

## 2014-04-02 LAB — RAPID URINE DRUG SCREEN, HOSP PERFORMED
Amphetamines: NOT DETECTED
Barbiturates: NOT DETECTED
Benzodiazepines: NOT DETECTED
Cocaine: NOT DETECTED
Opiates: NOT DETECTED
Tetrahydrocannabinol: NOT DETECTED

## 2014-04-02 LAB — URINALYSIS W MICROSCOPIC (NOT AT ARMC)
Bilirubin Urine: NEGATIVE
Glucose, UA: NEGATIVE mg/dL
Hgb urine dipstick: NEGATIVE
Ketones, ur: NEGATIVE mg/dL
Leukocytes, UA: NEGATIVE
Nitrite: NEGATIVE
Protein, ur: 100 mg/dL — AB
Specific Gravity, Urine: 1.007 (ref 1.005–1.030)
Urobilinogen, UA: 0.2 mg/dL (ref 0.0–1.0)
pH: 6 (ref 5.0–8.0)

## 2014-04-02 LAB — TSH: TSH: 0.361 u[IU]/mL (ref 0.350–4.500)

## 2014-04-02 MED ORDER — TEMAZEPAM 7.5 MG PO CAPS
7.5000 mg | ORAL_CAPSULE | Freq: Every day | ORAL | Status: DC
  Filled 2014-04-02: qty 1

## 2014-04-02 MED ORDER — SIMVASTATIN 10 MG PO TABS
10.0000 mg | ORAL_TABLET | Freq: Every day | ORAL | Status: DC
  Administered 2014-04-02: 10 mg via ORAL
  Filled 2014-04-02: qty 4
  Filled 2014-04-02 (×2): qty 1
  Filled 2014-04-02: qty 4

## 2014-04-02 MED ORDER — ZIPRASIDONE HCL 60 MG PO CAPS
60.0000 mg | ORAL_CAPSULE | Freq: Two times a day (BID) | ORAL | Status: DC
  Administered 2014-04-02 – 2014-04-03 (×3): 60 mg via ORAL
  Filled 2014-04-02 (×3): qty 1
  Filled 2014-04-02 (×2): qty 8
  Filled 2014-04-02: qty 1
  Filled 2014-04-02: qty 8
  Filled 2014-04-02: qty 3
  Filled 2014-04-02: qty 1
  Filled 2014-04-02: qty 8

## 2014-04-02 MED ORDER — TEMAZEPAM 15 MG PO CAPS
15.0000 mg | ORAL_CAPSULE | Freq: Every day | ORAL | Status: DC
Start: 2014-04-02 — End: 2014-04-02

## 2014-04-02 MED ORDER — NICOTINE 21 MG/24HR TD PT24
21.0000 mg | MEDICATED_PATCH | Freq: Every day | TRANSDERMAL | Status: DC
  Administered 2014-04-03: 21 mg via TRANSDERMAL
  Filled 2014-04-02 (×5): qty 1

## 2014-04-02 MED ORDER — NICOTINE 21 MG/24HR TD PT24
MEDICATED_PATCH | TRANSDERMAL | Status: AC
  Filled 2014-04-02: qty 1
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
## 2014-04-02 NOTE — Progress Notes (Signed)
[facility] Adult Case Management Discharge Plan :  Will you be returning to the same living situation after discharge: Yes,  home with her daugher At discharge, do you have transportation home?:Yes,  pt's daughter coming after lunch to pick up pt on THURSDAY 04/03/14 Do you have the ability to pay for your medications:Yes,  [employer]   Release of information consent forms completed and submitted to Medical Records by CSW.  Patient to Follow up at: Follow-up Information    Follow up with [facility] On 04/08/2014.   Why:  Appt. with Dr. Rosine Door at 2:15PM on this date for hospital follow-up.    Contact information:   ATTN: Dr. Rosine Door [address] [place], [place] [address] Phone: [phone] Fax: [phone]      Patient denies SI/HI:   Yes,  during self report and during admission    Safety Planning and Suicide Prevention discussed:  Yes,  SPE completed with pt's daughter. SPI pamphlet provided to pt and she was encouraged to share information with support network, ask questions, and ask questions  Smart, Alicia Amel  04/02/2014, 2:49 PM

## 2014-04-02 NOTE — Clinical Social Work Note (Signed)
Per Dr. Shea Evans, pt can d/c tomorrow, 04/03/14. Dr. Shea Evans and CSW spoke with pt's daughter who is agreeable to taking her home. Her appt for followup with Dr. Rosine Door is scheduled, SPE completed and d/c note is complete. Pt seesm to have had a reaction to Invega (new medication) and was hallucinating. Pt is asking for d/c today and was told by CSW that she cannot leave until tomorrow. Please note: pt's teeth and "a white bra" were lost at [facility] ED. Pt's daughter is angry about this and told CSW that the director at [facility] ED will be calling her.   [place], LCSWA 04/02/2014 3:54 PM

## 2014-04-02 NOTE — Tx Team (Signed)
Initial Interdisciplinary Treatment Plan   PATIENT STRESSORS: Financial difficulties Marital or family conflict Medication change or noncompliance   PATIENT STRENGTHS: Active sense of humor Average or above average intelligence Capable of independent living Supportive family/friends   PROBLEM LIST: Problem List/Patient Goals Date to be addressed Date deferred Reason deferred Estimated date of resolution  Depression 04/01/2014     anxiety 04/01/2014     Rick for suicide 04/01/2014     Medication noncompliance 04/01/2014                                    DISCHARGE CRITERIA:  Adequate post-discharge living arrangements Improved stabilization in mood, thinking, and/or behavior Verbal commitment to aftercare and medication compliance  PRELIMINARY DISCHARGE PLAN: Participate in family therapy Placement in alternative living arrangements  PATIENT/FAMIILY INVOLVEMENT: This treatment plan has been presented to and reviewed with the patient, Jennifer Lawson, and/or family member, The patient and family have been given the opportunity to ask questions and make suggestions.  JEHU-APPIAH, [place] Rogacki K 04/02/2014, 12:23 AM
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
## 2014-04-02 NOTE — BHH Group Notes (Signed)
[place] LCSW Group Therapy  04/02/2014 3:29 PM   Type of Therapy:  Group Therapy  Participation Level:  Active  Participation Quality:  Attentive  Affect:  Appropriate  Cognitive:  Appropriate  Insight:  Improving  Engagement in Therapy:  Engaged  Modes of Intervention:  Clarification, Education, Exploration and Socialization  Summary of Progress/Problems: Today's group focused on relapse prevention.  We defined the term, and then brainstormed on ways to prevent relapse.  Jennifer Lawson was grumpy throughout, but stayed for the entire time. As we were getting ready to start group, she was on the phone to her daughter requesting she come pick her up, and the daughter could be heard yelling at her about calls she was getting  Every 10 minutes from patient.  After hanging up, she immediately described how she is being lied to here about her discharge, and also talked about the staff in the ED losing her false teeth and her undergarments.  I suggested she did not need to stay for group since she was obviously upset, but she insisted she would sit quietly and did not wish to leave.  And she was good to her word.  She did interject several times, and each was about her problem with this place and her feelings about that, but she did so appropriately.  [name] 04/02/2014 , 3:29 PM
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
## 2014-04-02 NOTE — BHH Suicide Risk Assessment (Signed)
[place] INPATIENT:  Family/Significant Other Suicide Prevention Education  Suicide Prevention Education:  Education Completed; Harshika Mago (pt's daughter) [phone] has been identified by the patient as the family member/significant other with whom the patient will be residing, and identified as the person(s) who will aid the patient in the event of a mental health crisis (suicidal ideations/suicide attempt).  With written consent from the patient, the family member/significant other has been provided the following suicide prevention education, prior to the and/or following the discharge of the patient.  The suicide prevention education provided includes the following:  Suicide risk factors  Suicide prevention and interventions  National Suicide Hotline telephone number  [facility] assessment telephone number  [facility] Emergency Assistance [place] and/or Residential Mobile Crisis Unit telephone number  Request made of family/significant other to:  Remove weapons (e.g., guns, rifles, knives), all items previously/currently identified as safety concern.    Remove drugs/medications (over-the-counter, prescriptions, illicit drugs), all items previously/currently identified as a safety concern.  The family member/significant other verbalizes understanding of the suicide prevention education information provided.  The family member/significant other agrees to remove the items of safety concern listed above.  Smart, Shron Ozer LCSWA  04/02/2014, 2:46 PM
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
## 2014-04-02 NOTE — Progress Notes (Signed)
Patient ID: Jennifer Lawson, female   DOB: Jan 04, 1961, 53 y.o.   MRN: [id number] D. Patient presents with depressed mood, affect irritable. Remedy has been intrusive, irritable and demanding throughout shift today. With Probation officer she persistently requested to see Probation officer, and talk with MD about discharge, despite seeing MD multiple times. She denies any auditory hallucination or SI/HI. She states '' i don't know why I'm in here. They gave me a shot. '' patient denies any other acute concerns. Discussed pt progress with Dr. Shea Evans. Pt was offered urine specimen cup but unable to provide sample at this time. Medications given as ordered. R. Patient is safe. Will continue to monitor q 15 minutes for safety.

## 2014-04-03 DIAGNOSIS — F314 Bipolar disorder, current episode depressed, severe, without psychotic features: Principal | ICD-10-CM

## 2014-04-03 LAB — VITAMIN B12: Vitamin B-12: 537 pg/mL (ref 211–911)

## 2014-04-03 MED ORDER — DIVALPROEX SODIUM ER 250 MG PO TB24: 750.0000 mg | ORAL_TABLET | Freq: Every day | ORAL | Status: DC

## 2014-04-03 MED ORDER — FLUTICASONE-SALMETEROL 100-50 MCG/DOSE IN AEPB: 1.0000 | INHALATION_SPRAY | Freq: Two times a day (BID) | RESPIRATORY_TRACT | Status: DC

## 2014-04-03 MED ORDER — ALBUTEROL SULFATE (2.5 MG/3ML) 0.083% IN NEBU: 2.5000 mg | INHALATION_SOLUTION | RESPIRATORY_TRACT | Status: DC | PRN

## 2014-04-03 MED ORDER — ZIPRASIDONE HCL 60 MG PO CAPS: 60.0000 mg | ORAL_CAPSULE | Freq: Two times a day (BID) | ORAL | Status: DC

## 2014-04-03 MED ORDER — DIVALPROEX SODIUM ER 250 MG PO TB24
750.0000 mg | ORAL_TABLET | Freq: Every day | ORAL | Status: DC
  Filled 2014-04-03: qty 12

## 2014-04-03 MED ORDER — SIMVASTATIN 10 MG PO TABS: 10.0000 mg | ORAL_TABLET | Freq: Every day | ORAL | Status: DC

## 2014-04-03 MED ORDER — FUROSEMIDE 80 MG PO TABS: 80.0000 mg | ORAL_TABLET | Freq: Two times a day (BID) | ORAL | Status: DC

## 2014-04-03 MED ORDER — METOPROLOL TARTRATE 25 MG PO TABS: 25.0000 mg | ORAL_TABLET | Freq: Two times a day (BID) | ORAL | Status: DC

## 2014-04-03 MED ORDER — TEMAZEPAM 7.5 MG PO CAPS: 7.5000 mg | ORAL_CAPSULE | Freq: Every evening | ORAL | Status: DC | PRN

## 2014-04-03 NOTE — BHH Suicide Risk Assessment (Signed)
   Demographic Factors:  Unemployed  Total Time spent with patient: 30 minutes  Psychiatric Specialty Exam: Physical Exam  ROS  Blood pressure 135/92, pulse 94, temperature 98.3 F (36.8 C), temperature source Oral, resp. rate 20, height 5\' 1"  (1.549 m), weight 84.369 kg (186 lb), last menstrual period 05/09/2008, SpO2 99 %.Body mass index is 35.16 kg/(m^2).  General Appearance: Casual  Eye Contact::  Good  Speech:  Normal Rate  Volume:  Decreased  Mood:  Anxious  Affect:  Congruent  Thought Process:  Goal Directed  Orientation:  Full (Time, Place, and Person)  Thought Content:  Rumination  Suicidal Thoughts:  No  Homicidal Thoughts:  No  Memory:  Immediate;   Fair Recent;   Good Remote;   Fair  Judgement:  Intact  Insight:  Fair  Psychomotor Activity:  Decreased  Concentration:  Good  Recall:  Good  Fund of Knowledge:Good  Language: Fair  Akathisia:  No  Handed:  Right  AIMS (if indicated):     Assets:  Communication Skills Desire for Improvement Housing Social Support  Sleep:  Number of Hours: 6.75    Musculoskeletal: Strength & Muscle Tone: within normal limits Gait & Station: normal Patient leans: N/A   Mental Status Per Nursing Assessment::   On Admission:  NA  Current Mental Status by Physician: See above  Loss Factors: NA  Historical Factors: Prior suicide attempts  Risk Reduction Factors:   Reaction to the medication  Continued Clinical Symptoms:  Bipolar Disorder:   Mixed State  Cognitive Features That Contribute To Risk:  Closed-mindedness    Suicide Risk:  Minimal: No identifiable suicidal ideation.  Patients presenting with no risk factors but with morbid ruminations; may be classified as minimal risk based on the severity of the depressive symptoms  Discharge Diagnoses:   AXIS I:  Bipolar, mixed and Delirium AXIS II:  Deferred AXIS III:   Past Medical History  Diagnosis Date  . Mental disorder   . Depression   .  Hypertension   . Overdose   . Tobacco use disorder 11/13/2012  . Complication of anesthesia     difficulty going to sleep  . Chronic kidney disease     06/11/13- not on dialysis  . Shortness of breath     lying down flat  . PTSD (post-traumatic stress disorder)   . Asthma   . COPD (chronic obstructive pulmonary disease)   . Heart murmur   . GERD (gastroesophageal reflux disease)   . Seizures     "passsed out"  . History of blood transfusion   . Diabetes mellitus without complication     denies   AXIS IV:  other psychosocial or environmental problems AXIS V:  61-70 mild symptoms  Plan Of Care/Follow-up recommendations:  Activity:  As tolerated Diet:  Unchanged from the past  Is patient on multiple antipsychotic therapies at discharge:  No   Has Patient had three or more failed trials of antipsychotic monotherapy by history:  No  Recommended Plan for Multiple Antipsychotic Therapies: NA    ARFEEN,SYED T. 04/03/2014, 8:56 AM

## 2014-04-03 NOTE — Progress Notes (Signed)
Writer spoke with patient and she was not happy b/c she did not discharge today but is looking forward to d/c on tomorrow. She requested an ativan with her medications and writer informed her that this medication was not available. Patient was informed of her medications and she reported that she did not want to take her hs restoril and would take it later if she wad unable to sleep. Patient denies si/hi/a/v hallucinations. Support and encouragement given, safety maintained on unit with 15 min checks.

## 2014-04-03 NOTE — Progress Notes (Signed)
Adult Psychoeducational Group Note  Date:  04/03/2014 Time:  0900  Group Topic/Focus:  Goals Group:   The focus of this group is to help patients establish daily goals to achieve during treatment and discuss how the patient can incorporate goal setting into their daily lives to aide in recovery.  Participation Level:  Active  Participation Quality:  Intrusive  Affect:  Anxious  Cognitive:  Appropriate  Insight: Good  Engagement in Group:  Improving  Modes of Intervention:  Education  Additional Comments:   Abijah Roussel L 04/03/2014, 1:00 PM

## 2014-04-03 NOTE — Discharge Summary (Signed)
Physician Discharge Summary Note  Patient:  Jennifer Lawson is an 53 y.o., female MRN:  [id number] DOB:  01/27/1961 Patient phone:  [phone] (home)  Patient address:   [address] [place] [address],  Total Time spent with patient: Greater than 30 minutes  Date of Admission:  04/01/2014 Date of Discharge: 04/03/14  Reason for Admission: Mood stabilization treatment  Discharge Diagnoses: Active Problems:   Bipolar 1 disorder, depressed, severe   Delirium due to medical condition with behavioral disturbance   Psychiatric Specialty Exam: Physical Exam  Psychiatric: Her speech is normal. Judgment and thought content normal. Her mood appears not anxious. Her affect is not angry, not blunt, not labile and not inappropriate. Cognition and memory are normal. She does not exhibit a depressed mood.    Review of Systems  Constitutional: Negative.   HENT: Negative.   Eyes: Negative.   Respiratory: Negative.   Cardiovascular: Negative.   Gastrointestinal: Negative.   Genitourinary: Negative.   Skin: Negative.   Psychiatric/Behavioral: Positive for depression (Stable). Negative for suicidal ideas, hallucinations, memory loss and substance abuse. The patient has insomnia (Stable). The patient is not nervous/anxious.     Blood pressure 135/92, pulse 94, temperature 98.3 F (36.8 C), temperature source Oral, resp. rate 20, height 5\' 1"  (1.549 m), weight 84.369 kg (186 lb), last menstrual period 05/09/2008, SpO2 99 %.Body mass index is 35.16 kg/(m^2).  See Md's SRA                                                 Past Psychiatric History: Diagnosis: Bipolar disorder,PTSD  Hospitalizations: Several at [facility]  Outpatient Care:Dr. Darleene Lawson in the past. [facility]Heading [facility]  Substance Abuse Care: denies  Self-Mutilation:denies: Denies  Suicidal Attempts: OD on pills several times  Violent Behaviors: denies   Musculoskeletal: Strength &  Muscle Tone: within normal limits Gait & Station: normal Patient leans: N/A  DSM5: Schizophrenia Disorders:  NA Obsessive-Compulsive Disorders:  NA Trauma-Stressor Disorders:  Posttraumatic Stress Disorder (309.81) Substance/Addictive Disorders:  NA Depressive Disorders:  Bipolar 1 disorder, depressed, severe  Axis Diagnosis:  AXIS I:  Bipolar affective disorder, depressed, severe AXIS II:  Deferred AXIS III:   Past Medical History  Diagnosis Date  . Mental disorder   . Depression   . Hypertension   . Overdose   . Tobacco use disorder 11/13/2012  . Complication of anesthesia     difficulty going to sleep  . Chronic kidney disease     06/11/13- not on dialysis  . Shortness of breath     lying down flat  . PTSD (post-traumatic stress disorder)   . Asthma   . COPD (chronic obstructive pulmonary disease)   . Heart murmur   . GERD (gastroesophageal reflux disease)   . Seizures     "passsed out"  . History of blood transfusion   . Diabetes mellitus without complication     denies   AXIS IV:  other psychosocial or environmental problems and mental illness, chronic AXIS V:  62  Level of Care:  OP  Hospital Course:  Ms.Jennifer Lawson is a 53 year old AA, who is divorced, lives with her daughter in [place], has a history of bipolar disorder as well as PTSD. Patient was brought to [facility] by daughter for being confused as well as being agitated at home. Patient per daughter  has been non compliant with her medications but was recently started on Invega IM (given on Wednesday) . Pt however after receiving Invega IM became agitated and aggressive. Pt was also confused and disoriented on initial evaluation per TTS documentation.  [name] stay in this hospital was rather very brief. She was admitted to the hospital for evaluation due to increased agitation and aggressive behavior after use of an antipsychotic medication, Invega injectable. After admission assessment/evaluation, it was observed  that patient is stable. This is also evidenced by her reports of improved mood, absence of agitation, anxiety, SIHI and AVH. She has asked to be discharged today to continue routine psychiatric management and medication management on an outpatient basis. She will resume psychiatric services with the [facility]. She is currently being discharged under the care of her daughter. Transportation per daughter.  Consults:  psychiatry  Significant Diagnostic Studies:  labs: CBC with diff, , UDS, toxicology tests, U/A, Depakote levels, results reviewed, no changes  Discharge Vitals:   Blood pressure 135/92, pulse 94, temperature 98.3 F (36.8 C), temperature source Oral, resp. rate 20, height 5\' 1"  (1.549 m), weight 84.369 kg (186 lb), last menstrual period 05/09/2008, SpO2 99 %. Body mass index is 35.16 kg/(m^2). Lab Results:   Results for orders placed or performed during the hospital encounter of 04/01/14 (from the past 72 hour(s))  Urine rapid drug screen (hosp performed)     Status: None   Collection Time: 04/02/14  6:35 PM  Result Value Ref Range   Opiates NONE DETECTED NONE DETECTED   Cocaine NONE DETECTED NONE DETECTED   Benzodiazepines NONE DETECTED NONE DETECTED   Amphetamines NONE DETECTED NONE DETECTED   Tetrahydrocannabinol NONE DETECTED NONE DETECTED   Barbiturates NONE DETECTED NONE DETECTED    Comment:        DRUG SCREEN FOR MEDICAL PURPOSES ONLY.  IF CONFIRMATION IS NEEDED FOR ANY PURPOSE, NOTIFY LAB WITHIN 5 DAYS.        LOWEST DETECTABLE LIMITS FOR URINE DRUG SCREEN Drug Class       Cutoff (ng/mL) Amphetamine      1000 Barbiturate      200 Benzodiazepine   132 Tricyclics       440 Opiates          300 Cocaine          300 THC              50 Performed at [facility]   Urinalysis with microscopic     Status: Abnormal   Collection Time: 04/02/14  6:35 PM  Result Value Ref Range   Color, Urine YELLOW YELLOW   APPearance CLEAR CLEAR   Specific  Gravity, Urine 1.007 1.005 - 1.030   pH 6.0 5.0 - 8.0   Glucose, UA NEGATIVE NEGATIVE mg/dL   Hgb urine dipstick NEGATIVE NEGATIVE   Bilirubin Urine NEGATIVE NEGATIVE   Ketones, ur NEGATIVE NEGATIVE mg/dL   Protein, ur 100 (A) NEGATIVE mg/dL   Urobilinogen, UA 0.2 0.0 - 1.0 mg/dL   Nitrite NEGATIVE NEGATIVE   Leukocytes, UA NEGATIVE NEGATIVE   WBC, UA 0-2 <3 WBC/hpf   Squamous Epithelial / LPF RARE RARE    Comment: Performed at [facility]  Ethanol     Status: None   Collection Time: 04/02/14  8:00 PM  Result Value Ref Range   Alcohol, Ethyl (B) <11 0 - 11 mg/dL    Comment:        LOWEST  DETECTABLE LIMIT FOR SERUM ALCOHOL IS 11 mg/dL FOR MEDICAL PURPOSES ONLY Performed at [facility]   TSH     Status: None   Collection Time: 04/02/14  8:00 PM  Result Value Ref Range   TSH 0.361 0.350 - 4.500 uIU/mL    Comment: Performed at [facility]  Vitamin B12     Status: None   Collection Time: 04/02/14  8:00 PM  Result Value Ref Range   Vitamin B-12 537 211 - 911 pg/mL    Comment: Performed at [facility]    Physical Findings: AIMS: Facial and Oral Movements Muscles of Facial Expression: None, normal Lips and Perioral Area: None, normal Jaw: None, normal Tongue: None, normal,Extremity Movements Upper (arms, wrists, hands, fingers): None, normal Lower (legs, knees, ankles, toes): None, normal, Trunk Movements Neck, shoulders, hips: None, normal, Overall Severity Severity of abnormal movements (highest score from questions above): None, normal Incapacitation due to abnormal movements: None, normal Patient's awareness of abnormal movements (rate only patient's report): No Awareness, Dental Status Current problems with teeth and/or dentures?: Yes Does patient usually wear dentures?: Yes (upper and lower)  CIWA:    COWS:     Psychiatric Specialty Exam: See Psychiatric Specialty Exam and Suicide Risk Assessment completed by  Attending Physician prior to discharge.  Discharge destination:  Home  Is patient on multiple antipsychotic therapies at discharge:  No   Has Patient had three or more failed trials of antipsychotic monotherapy by history:  No  Recommended Plan for Multiple Antipsychotic Therapies: NA     Medication List    STOP taking these medications        hydrOXYzine 50 MG tablet  Commonly known as:  ATARAX/VISTARIL     INVEGA SUSTENNA 156 MG/ML Susp  Generic drug:  Paliperidone Palmitate     LORazepam 2 MG tablet  Commonly known as:  ATIVAN      TAKE these medications      Indication   albuterol (2.5 MG/3ML) 0.083% nebulizer solution  Commonly known as:  PROVENTIL  Take 3 mLs (2.5 mg total) by nebulization every 4 (four) hours as needed for wheezing or shortness of breath.   Indication:  Acute Bronchospasm     divalproex 250 MG 24 hr tablet  Commonly known as:  DEPAKOTE ER  Take 3 tablets (750 mg total) by mouth at bedtime. For mood stabilization   Indication:  Mood stabilization     Fluticasone-Salmeterol 100-50 MCG/DOSE Aepb  Commonly known as:  ADVAIR  Inhale 1 puff into the lungs 2 (two) times daily. For asthma   Indication:  Asthma, Chronic Obstructive Lung Disease     furosemide 80 MG tablet  Commonly known as:  LASIX  Take 1 tablet (80 mg total) by mouth 2 (two) times daily. For swellings   Indication:  Edema     metoprolol tartrate 25 MG tablet  Commonly known as:  LOPRESSOR  Take 1 tablet (25 mg total) by mouth 2 (two) times daily. For HTN   Indication:  High Blood Pressure     simvastatin 10 MG tablet  Commonly known as:  ZOCOR  Take 1 tablet (10 mg total) by mouth at bedtime. For high cholesterol/fats   Indication:  hyperlipidemia     temazepam 7.5 MG capsule  Commonly known as:  RESTORIL  Take 1 capsule (7.5 mg total) by mouth at bedtime as needed for sleep.   Indication:  Trouble Sleeping     ziprasidone 60 MG capsule  Commonly known as:  GEODON   Take 1 capsule (60 mg total) by mouth 2 (two) times daily with a meal. For mood control   Indication:  mood stabilization / psychosis       Follow-up Information    Follow up with [facility] On 04/08/2014.   Why:  Appt. with Dr. Rosine Door at 2:15PM on this date for hospital follow-up.    Contact information:   ATTN: Dr. Rosine Door [address] [place], [place] [address] Phone: [phone] Fax: [phone]     Follow-up recommendations:  Activity:  As tolerated Diet: As recommended by your primary care doctor. Keep all scheduled follow-up appointments as recommended.  Comments:  Take all your medications as prescribed by your mental healthcare provider. Report any adverse effects and or reactions from your medicines to your outpatient provider promptly. Patient is instructed and cautioned to not engage in alcohol and or illegal drug use while on prescription medicines. In the event of worsening symptoms, patient is instructed to call the crisis hotline, 911 and or go to the nearest ED for appropriate evaluation and treatment of symptoms. Follow-up with your primary care provider for your other medical issues, concerns and or health care needs.   Total Discharge Time:  Greater than 30 minutes.  Signed: Encarnacion Slates, PMHNP-BC 04/04/2014, 9:15 AM   I have personally seen the patient and agreed with the findings and involved in the treatment plan. Berniece Andreas, MD

## 2014-04-03 NOTE — Plan of Care (Signed)
Problem: Alteration in thought process Goal: STG-Patient is able to sleep at least 6 hours per night Outcome: Progressing Patient has slept at least 6 hours of sleep.

## 2014-04-03 NOTE — Progress Notes (Addendum)
Patient ID: Jennifer Lawson, female   DOB: 08-28-1960, 53 y.o.   MRN: [id number] D. Pt presents with anxious mood, affect congruent. Patient reports '' When is the doctor coming in? I want to go home. What time is it now  And how long do I have to wait to see the doctor? '' Pt denies any depression/SI/HI/A/V Hallucinations.  Patient was informed that MD will be in to assess patient and discuss discharge. Orders received for patients discharge. AVS reviewed with patient at length, copies provided. Pt verbalized understanding of follow up care /discharge plan. Crisis services reviewed., no signs of acute decompensation.Rx given with free supply of medications. All belongings returned. Pt states '' I called my daughter and she will be here at 60.'' Pt escorted from unit to lobby per her request to wait on daughter for transport home.

## 2014-04-07 NOTE — Progress Notes (Signed)
Patient Discharge Instructions:  After Visit Summary (AVS):   Faxed to:  04/07/14 Discharge Summary Note:   Faxed to:  04/07/14 Psychiatric Admission Assessment Note:   Faxed to:  04/07/14 Suicide Risk Assessment - Discharge Assessment:   Faxed to:  04/07/14 Faxed/Sent to the Next Level Care provider:  04/07/14 Faxed to [facility] [place], 04/07/2014, 3:32 PM

## 2014-04-12 ENCOUNTER — Emergency Department (HOSPITAL_COMMUNITY)
Admission: EM | Admit: 2014-04-12 | Discharge: 2014-04-13 | Disposition: A | Discharge status: Home / Self Care | Payer: PRIVATE HEALTH INSURANCE | Attending: Emergency Medicine | Admitting: Emergency Medicine

## 2014-04-12 ENCOUNTER — Encounter (HOSPITAL_COMMUNITY): Payer: Self-pay | Admitting: Emergency Medicine

## 2014-04-12 DIAGNOSIS — N189 Chronic kidney disease, unspecified: Secondary | ICD-10-CM | POA: Insufficient documentation

## 2014-04-12 DIAGNOSIS — Z7951 Long term (current) use of inhaled steroids: Secondary | ICD-10-CM | POA: Diagnosis not present

## 2014-04-12 DIAGNOSIS — Z8719 Personal history of other diseases of the digestive system: Secondary | ICD-10-CM | POA: Insufficient documentation

## 2014-04-12 DIAGNOSIS — I129 Hypertensive chronic kidney disease with stage 1 through stage 4 chronic kidney disease, or unspecified chronic kidney disease: Secondary | ICD-10-CM | POA: Diagnosis not present

## 2014-04-12 DIAGNOSIS — R0602 Shortness of breath: Secondary | ICD-10-CM

## 2014-04-12 DIAGNOSIS — Z79899 Other long term (current) drug therapy: Secondary | ICD-10-CM | POA: Insufficient documentation

## 2014-04-12 DIAGNOSIS — H109 Unspecified conjunctivitis: Secondary | ICD-10-CM | POA: Diagnosis not present

## 2014-04-12 DIAGNOSIS — E119 Type 2 diabetes mellitus without complications: Secondary | ICD-10-CM | POA: Insufficient documentation

## 2014-04-12 DIAGNOSIS — R011 Cardiac murmur, unspecified: Secondary | ICD-10-CM | POA: Insufficient documentation

## 2014-04-12 DIAGNOSIS — Z720 Tobacco use: Secondary | ICD-10-CM | POA: Insufficient documentation

## 2014-04-12 DIAGNOSIS — Z8659 Personal history of other mental and behavioral disorders: Secondary | ICD-10-CM | POA: Diagnosis not present

## 2014-04-12 DIAGNOSIS — J441 Chronic obstructive pulmonary disease with (acute) exacerbation: Secondary | ICD-10-CM | POA: Insufficient documentation

## 2014-04-12 NOTE — ED Notes (Signed)
Pt arrived to the ED with a complaint of shortness of breath, fever, body aches and headaches.  Pt state's symptoms have been present for three days.  Pt states that she suffers from PTSD.

## 2014-04-13 ENCOUNTER — Emergency Department (HOSPITAL_COMMUNITY)
Admission: EM | Admit: 2014-04-13 | Discharge: 2014-04-13 | Discharge status: Absent without leave | Payer: PRIVATE HEALTH INSURANCE

## 2014-04-13 ENCOUNTER — Emergency Department (HOSPITAL_COMMUNITY): Payer: PRIVATE HEALTH INSURANCE

## 2014-04-13 LAB — BASIC METABOLIC PANEL
Anion gap: 15 (ref 5–15)
BUN: 55 mg/dL — ABNORMAL HIGH (ref 6–23)
CO2: 20 mEq/L (ref 19–32)
Calcium: 10.8 mg/dL — ABNORMAL HIGH (ref 8.4–10.5)
Chloride: 104 mEq/L (ref 96–112)
Creatinine, Ser: 4.19 mg/dL — ABNORMAL HIGH (ref 0.50–1.10)
GFR calc Af Amer: 13 mL/min — ABNORMAL LOW (ref >90)
GFR calc non Af Amer: 11 mL/min — ABNORMAL LOW (ref >90)
Glucose, Bld: 80 mg/dL (ref 70–99)
Potassium: 5 mEq/L (ref 3.7–5.3)
Sodium: 139 mEq/L (ref 137–147)

## 2014-04-13 LAB — CBC
HCT: 30.7 % — ABNORMAL LOW (ref 36.0–46.0)
Hemoglobin: 9.6 g/dL — ABNORMAL LOW (ref 12.0–15.0)
MCH: 30.9 pg (ref 26.0–34.0)
MCHC: 31.3 g/dL (ref 30.0–36.0)
MCV: 98.7 fL (ref 78.0–100.0)
Platelets: 180 10*3/uL (ref 150–400)
RBC: 3.11 MIL/uL — ABNORMAL LOW (ref 3.87–5.11)
RDW: 14.9 % (ref 11.5–15.5)
WBC: 5.2 10*3/uL (ref 4.0–10.5)

## 2014-04-13 LAB — URINALYSIS, ROUTINE W REFLEX MICROSCOPIC
Bilirubin Urine: NEGATIVE
Glucose, UA: NEGATIVE mg/dL
Ketones, ur: NEGATIVE mg/dL
Leukocytes, UA: NEGATIVE
Nitrite: NEGATIVE
Protein, ur: >300 mg/dL — AB
Specific Gravity, Urine: 1.008 (ref 1.005–1.030)
Urobilinogen, UA: 0.2 mg/dL (ref 0.0–1.0)
pH: 6 (ref 5.0–8.0)

## 2014-04-13 LAB — URINE MICROSCOPIC-ADD ON

## 2014-04-13 MED ORDER — ERYTHROMYCIN 5 MG/GM OP OINT
TOPICAL_OINTMENT | Freq: Four times a day (QID) | OPHTHALMIC | Status: DC
  Administered 2014-04-13: 03:00:00 via OPHTHALMIC
  Filled 2014-04-13: qty 3.5

## 2014-04-13 MED ORDER — ACETAMINOPHEN 325 MG PO TABS
650.0000 mg | ORAL_TABLET | Freq: Once | ORAL | Status: AC
  Administered 2014-04-13: 650 mg via ORAL
  Filled 2014-04-13: qty 2

## 2014-04-13 MED ORDER — AZITHROMYCIN 250 MG PO TABS: ORAL_TABLET | ORAL | Status: DC

## 2014-04-13 NOTE — ED Provider Notes (Signed)
CSN: [id number]     Arrival date & time 04/12/14  2259 History   First MD Initiated Contact with Patient 04/13/14 0111     Chief Complaint  Patient presents with  . Shortness of Breath     (Consider location/radiation/quality/duration/timing/severity/associated sxs/prior Treatment) HPI  This is a 53 year old female with a long-standing history of psychiatric disease as well as renal disease not yet on hemodialysis. When ask why she is here she states because she has a headache and posttraumatic stress disorder. Per nursing notes she is here for shortness of breath. When asked about this, she states she has had 3 days of shortness of breath, subjective fever, body ache and headache. She has also had cough productive of mucus. She denies nausea, vomiting or diarrhea. The patient lives with her daughter.  Past Medical History  Diagnosis Date  . Mental disorder   . Depression   . Hypertension   . Overdose   . Tobacco use disorder 11/13/2012  . Complication of anesthesia     difficulty going to sleep  . Chronic kidney disease     06/11/13- not on dialysis  . Shortness of breath     lying down flat  . PTSD (post-traumatic stress disorder)   . Asthma   . COPD (chronic obstructive pulmonary disease)   . Heart murmur   . GERD (gastroesophageal reflux disease)   . Seizures     "passsed out"  . History of blood transfusion   . Diabetes mellitus without complication     denies   Past Surgical History  Procedure Laterality Date  . Right knee replacement      she says it was last year.  . Esophagogastroduodenoscopy Left 11/14/2012    Procedure: ESOPHAGOGASTRODUODENOSCOPY (EGD);  Surgeon: [name], MD;  Location: [facility] END[facility]COPY;  Service: Endoscopy;  Laterality: Left;  . Joint replacement Right     knee  . Parathyroidectomy    . Av fistula placement Right 06/12/2013    Procedure: ARTERIOVENOUS (AV) FISTULA CREATION; RIGHT  BASILIC VEIN TRANSPOSITION with Intraoperative ultrasound;  Surgeon:  Mal [name], MD;  Location: [facility] OR;  Service: Vascular;  Laterality: Right;   Family History  Problem Relation Age of Onset  . Diabetes Mother   . Hyperlipidemia Mother   . Hypertension Mother   . Diabetes Father   . Hypertension Father   . Hyperlipidemia Father    History  Substance Use Topics  . Smoking status: Current Every Day Smoker -- 2.00 packs/day for 40 years    Types: Cigarettes, Cigars  . Smokeless tobacco: Never Used  . Alcohol Use: No     Comment: none in over a month per daughter   OB History    No data available     Review of Systems  All other systems reviewed and are negative.   Allergies  Codeine sulfate; Haldol; Risperidone and related; Gabapentin; Invega; Trazodone and nefazodone; and Vistaril  Home Medications   Prior to Admission medications   Medication Sig Start Date End Date Taking? Authorizing Provider  albuterol (PROVENTIL) (2.5 MG/3ML) 0.083% nebulizer solution Take 3 mLs (2.5 mg total) by nebulization every 4 (four) hours as needed for wheezing or shortness of breath. 04/03/14  Yes Encarnacion Slates, NP  divalproex (DEPAKOTE ER) 250 MG 24 hr tablet Take 3 tablets (750 mg total) by mouth at bedtime. For mood stabilization 04/03/14  Yes Encarnacion Slates, NP  Fluticasone-Salmeterol (ADVAIR) 100-50 MCG/DOSE AEPB Inhale 1 puff into the lungs 2 (two)  times daily. For asthma 04/03/14  Yes Encarnacion Slates, NP  furosemide (LASIX) 80 MG tablet Take 1 tablet (80 mg total) by mouth 2 (two) times daily. For swellings 04/03/14  Yes Encarnacion Slates, NP  metoprolol tartrate (LOPRESSOR) 25 MG tablet Take 1 tablet (25 mg total) by mouth 2 (two) times daily. For HTN 04/03/14  Yes Encarnacion Slates, NP  simvastatin (ZOCOR) 10 MG tablet Take 1 tablet (10 mg total) by mouth at bedtime. For high cholesterol/fats 04/03/14  Yes Encarnacion Slates, NP  temazepam (RESTORIL) 7.5 MG capsule Take 1 capsule (7.5 mg total) by mouth at bedtime as needed for sleep. 04/03/14  Yes Encarnacion Slates, NP   ziprasidone (GEODON) 60 MG capsule Take 1 capsule (60 mg total) by mouth 2 (two) times daily with a meal. For mood control 04/03/14  Yes Encarnacion Slates, NP   BP 169/112 mmHg  Pulse 118  Temp(Src) 97.7 F (36.5 C) (Oral)  Resp 18  Ht 5\' 2"  (1.575 m)  Wt 203 lb (92.08 kg)  BMI 37.12 kg/m2  SpO2 95%  LMP 05/09/2008   Physical Exam  General: Well-developed, well-nourished female in no acute distress; appearance consistent with age of record HENT: normocephalic; atraumatic Eyes: pupils equal, round and reactive to light; extraocular muscles intact; right conjunctival injection with mucoid exudate Neck: supple; jugular venous distention Heart: regular rate and rhythm; distant sounds; tachycardia Lungs: clear to auscultation bilaterally; distant sounds Abdomen: soft; nondistended; nontender; right upper quadrant tenderness; bowel sounds present Extremities: No deformity; full range of motion; pulses normal Neurologic: Awake, alert; motor function intact in all extremities and symmetric; no facial droop Skin: Warm and dry Psychiatric: Flat affect; slow, deliberate speech; no HI or SI    ED Course  Procedures (including critical care time)   MDM   Nursing notes and vitals signs, including pulse oximetry, reviewed.  Summary of this visit's results, reviewed by myself:  Labs:  Results for orders placed or performed during the hospital encounter of 04/12/14 (from the past 24 hour(s))  CBC     Status: Abnormal   Collection Time: 04/13/14 12:39 AM  Result Value Ref Range   WBC 5.2 4.0 - 10.5 K/uL   RBC 3.11 (L) 3.87 - 5.11 MIL/uL   Hemoglobin 9.6 (L) 12.0 - 15.0 g/dL   HCT 30.7 (L) 36.0 - 46.0 %   MCV 98.7 78.0 - 100.0 fL   MCH 30.9 26.0 - 34.0 pg   MCHC 31.3 30.0 - 36.0 g/dL   RDW 14.9 11.5 - 15.5 %   Platelets 180 150 - 400 K/uL  Basic metabolic panel     Status: Abnormal   Collection Time: 04/13/14 12:39 AM  Result Value Ref Range   Sodium 139 137 - 147 mEq/L   Potassium  5.0 3.7 - 5.3 mEq/L   Chloride 104 96 - 112 mEq/L   CO2 20 19 - 32 mEq/L   Glucose, Bld 80 70 - 99 mg/dL   BUN 55 (H) 6 - 23 mg/dL   Creatinine, Ser 4.19 (H) 0.50 - 1.10 mg/dL   Calcium 10.8 (H) 8.4 - 10.5 mg/dL   GFR calc non Af Amer 11 (L) >90 mL/min   GFR calc Af Amer 13 (L) >90 mL/min   Anion gap 15 5 - 15  Urinalysis, Routine w reflex microscopic     Status: Abnormal   Collection Time: 04/13/14 12:52 AM  Result Value Ref Range   Color, Urine YELLOW YELLOW  APPearance CLEAR CLEAR   Specific Gravity, Urine 1.008 1.005 - 1.030   pH 6.0 5.0 - 8.0   Glucose, UA NEGATIVE NEGATIVE mg/dL   Hgb urine dipstick TRACE (A) NEGATIVE   Bilirubin Urine NEGATIVE NEGATIVE   Ketones, ur NEGATIVE NEGATIVE mg/dL   Protein, ur >300 (A) NEGATIVE mg/dL   Urobilinogen, UA 0.2 0.0 - 1.0 mg/dL   Nitrite NEGATIVE NEGATIVE   Leukocytes, UA NEGATIVE NEGATIVE  Urine microscopic-add on     Status: None   Collection Time: 04/13/14 12:52 AM  Result Value Ref Range   Squamous Epithelial / LPF RARE RARE   RBC / HPF 0-2 <3 RBC/hpf   Bacteria, UA RARE RARE    Imaging Studies: Dg Chest 1 View  04/13/2014   CLINICAL DATA:  Acute onset of shortness of breath. Current history of moderate asthma. Initial encounter.  EXAM: CHEST - 1 VIEW  COMPARISON:  Chest radiograph from 01/20/2014  FINDINGS: The lungs are well-aerated. Patchy right basilar and left midlung airspace opacities raise concern for mild pneumonia. Underlying vascular congestion is noted. Mild interstitial edema might have a similar appearance. No pleural effusion or pneumothorax is seen.  The cardiomediastinal silhouette is mildly enlarged. No acute osseous abnormalities are seen.  IMPRESSION: Patchy right basilar and left midlung airspace opacities raise concern for mild pneumonia. Given underlying vascular congestion and mild cardiomegaly, mild interstitial edema might have a similar appearance.   Electronically Signed   By: Garald Balding M.D.   On:  04/13/2014 01:06   2:24 AM The patient has been afebrile in the emergency department. Her oxygen saturation is 97% on room air. She is in no distress. She has no wheezing. She has required no albuterol breathing treatments. She has no leukocytosis. We will place her on Zithromax for possible pneumonia and have her return if worsening.    Wynetta Fines, MD 04/13/14 [phone]

## 2014-04-13 NOTE — Progress Notes (Signed)
Pt d/c. Patient verbalizing she does not have transportation home. Daughter called and she says she can not come get her. Patient escorted patient to lobby. Security aware of situation. Pt left room 21 stable and ambulatory.

## 2014-05-13 ENCOUNTER — Inpatient Hospital Stay (HOSPITAL_COMMUNITY)
Admission: EM | Admit: 2014-05-13 | Discharge: 2014-05-20 | DRG: 682 | Disposition: A | Discharge status: Skilled Nursing Facility | Payer: Medicare Other | Attending: Internal Medicine | Admitting: Internal Medicine

## 2014-05-13 ENCOUNTER — Emergency Department (HOSPITAL_COMMUNITY): Payer: Medicare Other

## 2014-05-13 ENCOUNTER — Encounter (HOSPITAL_COMMUNITY): Payer: Self-pay | Admitting: Internal Medicine

## 2014-05-13 DIAGNOSIS — J9811 Atelectasis: Secondary | ICD-10-CM | POA: Diagnosis not present

## 2014-05-13 DIAGNOSIS — R1319 Other dysphagia: Secondary | ICD-10-CM | POA: Diagnosis not present

## 2014-05-13 DIAGNOSIS — T39011A Poisoning by aspirin, accidental (unintentional), initial encounter: Secondary | ICD-10-CM | POA: Diagnosis present

## 2014-05-13 DIAGNOSIS — J9601 Acute respiratory failure with hypoxia: Secondary | ICD-10-CM | POA: Diagnosis not present

## 2014-05-13 DIAGNOSIS — J449 Chronic obstructive pulmonary disease, unspecified: Secondary | ICD-10-CM | POA: Diagnosis present

## 2014-05-13 DIAGNOSIS — Z6833 Body mass index (BMI) 33.0-33.9, adult: Secondary | ICD-10-CM | POA: Diagnosis not present

## 2014-05-13 DIAGNOSIS — R509 Fever, unspecified: Secondary | ICD-10-CM

## 2014-05-13 DIAGNOSIS — Z992 Dependence on renal dialysis: Secondary | ICD-10-CM

## 2014-05-13 DIAGNOSIS — I120 Hypertensive chronic kidney disease with stage 5 chronic kidney disease or end stage renal disease: Secondary | ICD-10-CM | POA: Diagnosis not present

## 2014-05-13 DIAGNOSIS — E1165 Type 2 diabetes mellitus with hyperglycemia: Secondary | ICD-10-CM | POA: Diagnosis present

## 2014-05-13 DIAGNOSIS — F17210 Nicotine dependence, cigarettes, uncomplicated: Secondary | ICD-10-CM | POA: Diagnosis present

## 2014-05-13 DIAGNOSIS — M25562 Pain in left knee: Secondary | ICD-10-CM | POA: Diagnosis not present

## 2014-05-13 DIAGNOSIS — R0989 Other specified symptoms and signs involving the circulatory and respiratory systems: Secondary | ICD-10-CM | POA: Diagnosis not present

## 2014-05-13 DIAGNOSIS — I959 Hypotension, unspecified: Secondary | ICD-10-CM | POA: Diagnosis not present

## 2014-05-13 DIAGNOSIS — S8012XA Contusion of left lower leg, initial encounter: Secondary | ICD-10-CM | POA: Diagnosis not present

## 2014-05-13 DIAGNOSIS — R609 Edema, unspecified: Secondary | ICD-10-CM | POA: Diagnosis not present

## 2014-05-13 DIAGNOSIS — E878 Other disorders of electrolyte and fluid balance, not elsewhere classified: Secondary | ICD-10-CM | POA: Diagnosis not present

## 2014-05-13 DIAGNOSIS — K219 Gastro-esophageal reflux disease without esophagitis: Secondary | ICD-10-CM | POA: Diagnosis not present

## 2014-05-13 DIAGNOSIS — N185 Chronic kidney disease, stage 5: Secondary | ICD-10-CM | POA: Diagnosis not present

## 2014-05-13 DIAGNOSIS — R41841 Cognitive communication deficit: Secondary | ICD-10-CM | POA: Diagnosis not present

## 2014-05-13 DIAGNOSIS — F22 Delusional disorders: Secondary | ICD-10-CM | POA: Diagnosis not present

## 2014-05-13 DIAGNOSIS — E11649 Type 2 diabetes mellitus with hypoglycemia without coma: Secondary | ICD-10-CM | POA: Diagnosis present

## 2014-05-13 DIAGNOSIS — E876 Hypokalemia: Secondary | ICD-10-CM | POA: Diagnosis not present

## 2014-05-13 DIAGNOSIS — S8992XA Unspecified injury of left lower leg, initial encounter: Secondary | ICD-10-CM | POA: Diagnosis not present

## 2014-05-13 DIAGNOSIS — T39091A Poisoning by salicylates, accidental (unintentional), initial encounter: Secondary | ICD-10-CM | POA: Diagnosis not present

## 2014-05-13 DIAGNOSIS — D638 Anemia in other chronic diseases classified elsewhere: Secondary | ICD-10-CM | POA: Diagnosis present

## 2014-05-13 DIAGNOSIS — Z452 Encounter for adjustment and management of vascular access device: Secondary | ICD-10-CM

## 2014-05-13 DIAGNOSIS — N179 Acute kidney failure, unspecified: Secondary | ICD-10-CM | POA: Diagnosis not present

## 2014-05-13 DIAGNOSIS — E43 Unspecified severe protein-calorie malnutrition: Secondary | ICD-10-CM | POA: Diagnosis present

## 2014-05-13 DIAGNOSIS — I129 Hypertensive chronic kidney disease with stage 1 through stage 4 chronic kidney disease, or unspecified chronic kidney disease: Secondary | ICD-10-CM | POA: Diagnosis not present

## 2014-05-13 DIAGNOSIS — F319 Bipolar disorder, unspecified: Secondary | ICD-10-CM | POA: Diagnosis not present

## 2014-05-13 DIAGNOSIS — R488 Other symbolic dysfunctions: Secondary | ICD-10-CM | POA: Diagnosis not present

## 2014-05-13 DIAGNOSIS — I10 Essential (primary) hypertension: Secondary | ICD-10-CM | POA: Diagnosis not present

## 2014-05-13 DIAGNOSIS — Z978 Presence of other specified devices: Secondary | ICD-10-CM

## 2014-05-13 DIAGNOSIS — N19 Unspecified kidney failure: Secondary | ICD-10-CM | POA: Diagnosis not present

## 2014-05-13 DIAGNOSIS — F4310 Post-traumatic stress disorder, unspecified: Secondary | ICD-10-CM | POA: Diagnosis not present

## 2014-05-13 DIAGNOSIS — E8770 Fluid overload, unspecified: Secondary | ICD-10-CM | POA: Diagnosis not present

## 2014-05-13 DIAGNOSIS — M6281 Muscle weakness (generalized): Secondary | ICD-10-CM | POA: Diagnosis not present

## 2014-05-13 DIAGNOSIS — R278 Other lack of coordination: Secondary | ICD-10-CM | POA: Diagnosis not present

## 2014-05-13 DIAGNOSIS — F99 Mental disorder, not otherwise specified: Secondary | ICD-10-CM | POA: Diagnosis not present

## 2014-05-13 DIAGNOSIS — T148XXA Other injury of unspecified body region, initial encounter: Secondary | ICD-10-CM

## 2014-05-13 DIAGNOSIS — F3130 Bipolar disorder, current episode depressed, mild or moderate severity, unspecified: Secondary | ICD-10-CM | POA: Diagnosis present

## 2014-05-13 DIAGNOSIS — G9341 Metabolic encephalopathy: Secondary | ICD-10-CM | POA: Diagnosis not present

## 2014-05-13 DIAGNOSIS — Z96651 Presence of right artificial knee joint: Secondary | ICD-10-CM | POA: Diagnosis not present

## 2014-05-13 DIAGNOSIS — R2689 Other abnormalities of gait and mobility: Secondary | ICD-10-CM | POA: Diagnosis not present

## 2014-05-13 DIAGNOSIS — R400 Somnolence: Secondary | ICD-10-CM

## 2014-05-13 DIAGNOSIS — E875 Hyperkalemia: Secondary | ICD-10-CM | POA: Diagnosis not present

## 2014-05-13 DIAGNOSIS — J45909 Unspecified asthma, uncomplicated: Secondary | ICD-10-CM | POA: Diagnosis present

## 2014-05-13 DIAGNOSIS — M25462 Effusion, left knee: Secondary | ICD-10-CM | POA: Diagnosis not present

## 2014-05-13 DIAGNOSIS — E872 Acidosis: Secondary | ICD-10-CM | POA: Diagnosis not present

## 2014-05-13 DIAGNOSIS — N186 End stage renal disease: Secondary | ICD-10-CM | POA: Diagnosis present

## 2014-05-13 DIAGNOSIS — N390 Urinary tract infection, site not specified: Secondary | ICD-10-CM | POA: Diagnosis not present

## 2014-05-13 DIAGNOSIS — I517 Cardiomegaly: Secondary | ICD-10-CM | POA: Diagnosis not present

## 2014-05-13 DIAGNOSIS — G9340 Encephalopathy, unspecified: Secondary | ICD-10-CM

## 2014-05-13 DIAGNOSIS — N189 Chronic kidney disease, unspecified: Secondary | ICD-10-CM | POA: Diagnosis not present

## 2014-05-13 DIAGNOSIS — G9349 Other encephalopathy: Secondary | ICD-10-CM | POA: Diagnosis not present

## 2014-05-13 DIAGNOSIS — R4182 Altered mental status, unspecified: Secondary | ICD-10-CM | POA: Diagnosis not present

## 2014-05-13 DIAGNOSIS — Z4682 Encounter for fitting and adjustment of non-vascular catheter: Secondary | ICD-10-CM | POA: Diagnosis not present

## 2014-05-13 DIAGNOSIS — E874 Mixed disorder of acid-base balance: Secondary | ICD-10-CM | POA: Diagnosis not present

## 2014-05-13 DIAGNOSIS — S79929A Unspecified injury of unspecified thigh, initial encounter: Secondary | ICD-10-CM | POA: Diagnosis not present

## 2014-05-13 DIAGNOSIS — J9600 Acute respiratory failure, unspecified whether with hypoxia or hypercapnia: Secondary | ICD-10-CM | POA: Diagnosis not present

## 2014-05-13 LAB — I-STAT TROPONIN, ED: Troponin i, poc: 0.02 ng/mL (ref 0.00–0.08)

## 2014-05-13 LAB — CBG MONITORING, ED
Glucose-Capillary: 74 mg/dL (ref 70–99)
Glucose-Capillary: 73 mg/dL (ref 70–99)

## 2014-05-13 LAB — DIFFERENTIAL
Basophils Absolute: 0 10*3/uL (ref 0.0–0.1)
Basophils Relative: 0 % (ref 0–1)
Eosinophils Absolute: 0.1 10*3/uL (ref 0.0–0.7)
Eosinophils Relative: 3 % (ref 0–5)
Lymphocytes Relative: 14 % (ref 12–46)
Lymphs Abs: 0.6 10*3/uL — ABNORMAL LOW (ref 0.7–4.0)
Monocytes Absolute: 0.6 10*3/uL (ref 0.1–1.0)
Monocytes Relative: 14 % — ABNORMAL HIGH (ref 3–12)
Neutro Abs: 2.9 10*3/uL (ref 1.7–7.7)
Neutrophils Relative %: 69 % (ref 43–77)

## 2014-05-13 LAB — COMPREHENSIVE METABOLIC PANEL
ALT: 11 U/L (ref 0–35)
AST: 13 U/L (ref 0–37)
Albumin: 3.4 g/dL — ABNORMAL LOW (ref 3.5–5.2)
Alkaline Phosphatase: 55 U/L (ref 39–117)
Anion gap: 13 (ref 5–15)
BUN: 60 mg/dL — ABNORMAL HIGH (ref 6–23)
CO2: 19 mmol/L (ref 19–32)
Calcium: 9 mg/dL (ref 8.4–10.5)
Chloride: 110 mEq/L (ref 96–112)
Creatinine, Ser: 4.8 mg/dL — ABNORMAL HIGH (ref 0.50–1.10)
GFR calc Af Amer: 11 mL/min — ABNORMAL LOW (ref >90)
GFR calc non Af Amer: 9 mL/min — ABNORMAL LOW (ref >90)
Glucose, Bld: 77 mg/dL (ref 70–99)
Potassium: 5.1 mmol/L (ref 3.5–5.1)
Sodium: 142 mmol/L (ref 135–145)
Total Bilirubin: 1.1 mg/dL (ref 0.3–1.2)
Total Protein: 6.9 g/dL (ref 6.0–8.3)

## 2014-05-13 LAB — RAPID URINE DRUG SCREEN, HOSP PERFORMED
Amphetamines: NOT DETECTED
Barbiturates: NOT DETECTED
Benzodiazepines: NOT DETECTED
Cocaine: NOT DETECTED
Opiates: NOT DETECTED
Tetrahydrocannabinol: NOT DETECTED

## 2014-05-13 LAB — APTT: aPTT: 38 seconds — ABNORMAL HIGH (ref 24–37)

## 2014-05-13 LAB — I-STAT ARTERIAL BLOOD GAS, ED
Acid-base deficit: 8 mmol/L — ABNORMAL HIGH (ref 0.0–2.0)
Bicarbonate: 19.2 mEq/L — ABNORMAL LOW (ref 20.0–24.0)
O2 Saturation: 89 %
Patient temperature: 98.6
TCO2: 21 mmol/L (ref 0–100)
pCO2 arterial: 45.2 mmHg — ABNORMAL HIGH (ref 35.0–45.0)
pH, Arterial: 7.236 — ABNORMAL LOW (ref 7.350–7.450)
pO2, Arterial: 67 mmHg — ABNORMAL LOW (ref 80.0–100.0)

## 2014-05-13 LAB — I-STAT CHEM 8, ED
BUN: 58 mg/dL — ABNORMAL HIGH (ref 6–23)
Calcium, Ion: 1.14 mmol/L (ref 1.12–1.23)
Chloride: 114 mEq/L — ABNORMAL HIGH (ref 96–112)
Creatinine, Ser: 4.4 mg/dL — ABNORMAL HIGH (ref 0.50–1.10)
Glucose, Bld: 72 mg/dL (ref 70–99)
HCT: 32 % — ABNORMAL LOW (ref 36.0–46.0)
Hemoglobin: 10.9 g/dL — ABNORMAL LOW (ref 12.0–15.0)
Potassium: 5.1 mmol/L (ref 3.5–5.1)
Sodium: 141 mmol/L (ref 135–145)
TCO2: 18 mmol/L (ref 0–100)

## 2014-05-13 LAB — LIPASE, BLOOD: Lipase: 25 U/L (ref 11–59)

## 2014-05-13 LAB — CBC
HCT: 30.7 % — ABNORMAL LOW (ref 36.0–46.0)
Hemoglobin: 9.4 g/dL — ABNORMAL LOW (ref 12.0–15.0)
MCH: 29.7 pg (ref 26.0–34.0)
MCHC: 30.6 g/dL (ref 30.0–36.0)
MCV: 97.2 fL (ref 78.0–100.0)
Platelets: 165 10*3/uL (ref 150–400)
RBC: 3.16 MIL/uL — ABNORMAL LOW (ref 3.87–5.11)
RDW: 16.5 % — ABNORMAL HIGH (ref 11.5–15.5)
WBC: 4.3 10*3/uL (ref 4.0–10.5)

## 2014-05-13 LAB — PROTIME-INR
INR: 1.15 (ref 0.00–1.49)
Prothrombin Time: 14.8 seconds (ref 11.6–15.2)

## 2014-05-13 LAB — AMMONIA: Ammonia: 25 umol/L (ref 11–32)

## 2014-05-13 LAB — ACETAMINOPHEN LEVEL: Acetaminophen (Tylenol), Serum: <10 ug/mL — ABNORMAL LOW (ref 10–30)

## 2014-05-13 LAB — SALICYLATE LEVEL: Salicylate Lvl: 23.2 mg/dL — ABNORMAL HIGH (ref 2.8–20.0)

## 2014-05-13 LAB — ETHANOL: Alcohol, Ethyl (B): <5 mg/dL (ref 0–9)

## 2014-05-13 LAB — TSH: TSH: 0.813 u[IU]/mL (ref 0.350–4.500)

## 2014-05-13 LAB — VALPROIC ACID LEVEL: Valproic Acid Lvl: 56.3 ug/mL (ref 50.0–100.0)

## 2014-05-13 MED ORDER — MOMETASONE FURO-FORMOTEROL FUM 100-5 MCG/ACT IN AERO
2.0000 | INHALATION_SPRAY | Freq: Two times a day (BID) | RESPIRATORY_TRACT | Status: DC
  Filled 2014-05-13: qty 8.8

## 2014-05-13 MED ORDER — STROKE: EARLY STAGES OF RECOVERY BOOK
Freq: Once | Status: AC
  Administered 2014-05-14: 02:00:00
  Filled 2014-05-13: qty 1

## 2014-05-13 MED ORDER — LABETALOL HCL 5 MG/ML IV SOLN: 10.0000 mg | INTRAVENOUS | Status: DC | PRN

## 2014-05-13 MED ORDER — ALBUTEROL SULFATE (2.5 MG/3ML) 0.083% IN NEBU
2.5000 mg | INHALATION_SOLUTION | RESPIRATORY_TRACT | Status: DC | PRN
  Administered 2014-05-17: 2.5 mg via RESPIRATORY_TRACT
  Filled 2014-05-13: qty 3

## 2014-05-13 MED ORDER — SODIUM CHLORIDE 0.9 % IJ SOLN
3.0000 mL | Freq: Two times a day (BID) | INTRAMUSCULAR | Status: DC
Start: 2014-05-13 — End: 2014-05-20
  Administered 2014-05-14 – 2014-05-20 (×12): 3 mL via INTRAVENOUS

## 2014-05-13 MED ORDER — HEPARIN SODIUM (PORCINE) 5000 UNIT/ML IJ SOLN
5000.0000 [IU] | Freq: Three times a day (TID) | INTRAMUSCULAR | Status: DC
  Administered 2014-05-14 – 2014-05-17 (×11): 5000 [IU] via SUBCUTANEOUS
  Filled 2014-05-13 (×18): qty 1

## 2014-05-13 NOTE — ED Provider Notes (Signed)
CSN: [id number]     Arrival date & time 05/13/14  1654 History   First MD Initiated Contact with Patient 05/13/14 1720     Chief Complaint  Patient presents with  . Altered Mental Status  . Urinary Retention     (Consider location/radiation/quality/duration/timing/severity/associated sxs/prior Treatment) Patient is a 54 y.o. female presenting with altered mental status.  Altered Mental Status Presenting symptoms: combativeness, confusion, disorientation and lethargy   Severity:  Severe Most recent episode:  Today Episode history:  Multiple Duration:  3 days Timing:  Constant Progression:  Waxing and waning Chronicity:  Recurrent Context: alcohol use (has hx of using alcohol when daughter leaves the house)   Context: not head injury  Not taking medications as prescribed: question of--pt take own psychaitric medications, takes bc powders.   Associated symptoms: no abdominal pain, no difficulty breathing and no vomiting     Past Medical History  Diagnosis Date  . Mental disorder   . Depression   . Hypertension   . Overdose   . Tobacco use disorder 11/13/2012  . Complication of anesthesia     difficulty going to sleep  . Chronic kidney disease     06/11/13- not on dialysis  . Shortness of breath     lying down flat  . PTSD (post-traumatic stress disorder)   . Asthma   . COPD (chronic obstructive pulmonary disease)   . Heart murmur   . GERD (gastroesophageal reflux disease)   . Seizures     "passsed out"  . History of blood transfusion   . Diabetes mellitus without complication     denies   Past Surgical History  Procedure Laterality Date  . Right knee replacement      she says it was last year.  . Esophagogastroduodenoscopy Left 11/14/2012    Procedure: ESOPHAGOGASTRODUODENOSCOPY (EGD);  Surgeon: [name], MD;  Location: [facility];  Service: Endoscopy;  Laterality: Left;  . Joint replacement Right     knee  . Parathyroidectomy    . Av fistula placement Right  06/12/2013    Procedure: ARTERIOVENOUS (AV) FISTULA CREATION; RIGHT  BASILIC VEIN TRANSPOSITION with Intraoperative ultrasound;  Surgeon: Mal [name], MD;  Location: [facility] OR;  Service: Vascular;  Laterality: Right;   Family History  Problem Relation Age of Onset  . Diabetes Mother   . Hyperlipidemia Mother   . Hypertension Mother   . Diabetes Father   . Hypertension Father   . Hyperlipidemia Father    History  Substance Use Topics  . Smoking status: Current Every Day Smoker -- 2.00 packs/day for 40 years    Types: Cigarettes, Cigars  . Smokeless tobacco: Never Used  . Alcohol Use: No     Comment: none in over a month per daughter   OB History    No data available     Review of Systems  Unable to perform ROS: Mental status change  Gastrointestinal: Negative for vomiting and abdominal pain.  Psychiatric/Behavioral: Positive for confusion.      Allergies  Codeine sulfate; Haldol; Risperidone and related; Gabapentin; Invega; Trazodone and nefazodone; and Vistaril  Home Medications   Prior to Admission medications   Medication Sig Start Date End Date Taking? Authorizing Provider  albuterol (PROVENTIL) (2.5 MG/3ML) 0.083% nebulizer solution Take 3 mLs (2.5 mg total) by nebulization every 4 (four) hours as needed for wheezing or shortness of breath. 04/03/14  Yes Encarnacion Slates, NP  Aspirin-Salicylamide-Caffeine (BC HEADACHE POWDER PO) Take 6 each by mouth daily as  needed (back pain).   Yes Historical Provider, MD  diphenhydrAMINE (SOMINEX) 25 MG tablet Take 25-50 mg by mouth at bedtime as needed for sleep.   Yes Historical Provider, MD  divalproex (DEPAKOTE ER) 250 MG 24 hr tablet Take 3 tablets (750 mg total) by mouth at bedtime. For mood stabilization 04/03/14  Yes Encarnacion Slates, NP  divalproex (DEPAKOTE) 500 MG DR tablet Take 500 mg by mouth 2 (two) times daily.   Yes Historical Provider, MD  doxepin (SINEQUAN) 10 MG capsule Take 10 mg by mouth at bedtime as needed (insomnia).     Yes Historical Provider, MD  Fluticasone-Salmeterol (ADVAIR) 100-50 MCG/DOSE AEPB Inhale 1 puff into the lungs 2 (two) times daily. For asthma 04/03/14  Yes Encarnacion Slates, NP  furosemide (LASIX) 80 MG tablet Take 1 tablet (80 mg total) by mouth 2 (two) times daily. For swellings 04/03/14  Yes Encarnacion Slates, NP  Menthol, Topical Analgesic, (ICY HOT EX) Apply 1 application topically daily as needed (back pain).   Yes Historical Provider, MD  metoprolol tartrate (LOPRESSOR) 25 MG tablet Take 1 tablet (25 mg total) by mouth 2 (two) times daily. For HTN 04/03/14  Yes Encarnacion Slates, NP  azithromycin (ZITHROMAX Z-PAK) 250 MG tablet 2 po day one, then 1 daily x 4 days Patient not taking: Reported on 05/13/2014 04/13/14   Karen Chafe Molpus, MD  simvastatin (ZOCOR) 10 MG tablet Take 1 tablet (10 mg total) by mouth at bedtime. For high cholesterol/fats Patient not taking: Reported on 05/13/2014 04/03/14   Encarnacion Slates, NP  temazepam (RESTORIL) 7.5 MG capsule Take 1 capsule (7.5 mg total) by mouth at bedtime as needed for sleep. 04/03/14   Encarnacion Slates, NP  ziprasidone (GEODON) 60 MG capsule Take 1 capsule (60 mg total) by mouth 2 (two) times daily with a meal. For mood control Patient taking differently: Take 60 mg by mouth at bedtime. For mood control 04/03/14   Encarnacion Slates, NP   BP 130/69 mmHg  Pulse 103  Temp(Src) 97.8 F (36.6 C) (Oral)  Resp 17  Ht 5\' 2"  (1.575 m)  Wt 202 lb 13.2 oz (92 kg)  BMI 37.09 kg/m2  SpO2 94%  LMP 05/09/2008 Physical Exam  Constitutional: She appears well-developed and well-nourished. She appears listless. No distress.  HENT:  Head: Normocephalic and atraumatic.  Eyes: Conjunctivae and EOM are normal.  Neck: Normal range of motion.  Cardiovascular: Normal rate, regular rhythm and intact distal pulses.  Exam reveals no gallop and no friction rub.   Murmur heard. Pulmonary/Chest: Effort normal and breath sounds normal. No respiratory distress. She has no wheezes. She has  no rales.  Abdominal: Soft. She exhibits no distension. There is no tenderness. There is no guarding.  Musculoskeletal: She exhibits no edema or tenderness.  Contusions over legs  Neurological: She has normal strength. She appears listless. No cranial nerve deficit or sensory deficit. Coordination normal. GCS eye subscore is 3. GCS verbal subscore is 5. GCS motor subscore is 6.  Exam waxing/waning, will follow some commands and answer some orientation questions. Normal movement of 4 extremities, mild dysarthria Deferred ambulation.   Skin: Skin is warm and dry. Bruising and ecchymosis noted. No rash noted. She is not diaphoretic. No erythema.  Nursing note and vitals reviewed.   ED Course  Procedures (including critical care time) Labs Review Labs Reviewed  APTT - Abnormal; Notable for the following:    aPTT 38 (*)    All  other components within normal limits  CBC - Abnormal; Notable for the following:    RBC 3.16 (*)    Hemoglobin 9.4 (*)    HCT 30.7 (*)    RDW 16.5 (*)    All other components within normal limits  DIFFERENTIAL - Abnormal; Notable for the following:    Lymphs Abs 0.6 (*)    Monocytes Relative 14 (*)    All other components within normal limits  COMPREHENSIVE METABOLIC PANEL - Abnormal; Notable for the following:    BUN 60 (*)    Creatinine, Ser 4.80 (*)    Albumin 3.4 (*)    GFR calc non Af Amer 9 (*)    GFR calc Af Amer 11 (*)    All other components within normal limits  ACETAMINOPHEN LEVEL - Abnormal; Notable for the following:    Acetaminophen (Tylenol), Serum <10.0 (*)    All other components within normal limits  SALICYLATE LEVEL - Abnormal; Notable for the following:    Salicylate Lvl 84.1 (*)    All other components within normal limits  I-STAT CHEM 8, ED - Abnormal; Notable for the following:    Chloride 114 (*)    BUN 58 (*)    Creatinine, Ser 4.40 (*)    Hemoglobin 10.9 (*)    HCT 32.0 (*)    All other components within normal limits   PROTIME-INR  ETHANOL  LIPASE, BLOOD  VALPROIC ACID LEVEL  AMMONIA  TSH  URINE RAPID DRUG SCREEN (HOSP PERFORMED)  CBG MONITORING, ED  I-STAT TROPOININ, ED    Imaging Review Ct Head (brain) Wo Contrast  05/13/2014   CLINICAL DATA:  Altered mental status, urinary retention  EXAM: CT HEAD WITHOUT CONTRAST  TECHNIQUE: Contiguous axial images were obtained from the base of the skull through the vertex without intravenous contrast.  COMPARISON:  01/15/2014 and brain MRI a 01/16/2014  FINDINGS: No skull fracture is noted. Again noted mild mucosal thickening posterior aspect of left maxillary sinus. The sphenoid sinuses and mastoid air cells are unremarkable. Frontal sinuses are unremarkable.  No intracranial hemorrhage, mass effect or midline shift. No acute cortical infarction. No mass lesion is noted on this unenhanced scan. The gray and white-matter differentiation is preserved.  IMPRESSION: No acute intracranial abnormality. Stable mild mucosal thickening posterior aspect of the left maxillary sinus. No definite acute cortical infarction.   Electronically Signed   By: Lahoma Crocker M.D.   On: 05/13/2014 17:27     EKG Interpretation   Date/Time:  Tuesday May 13 2014 17:03:17 EST Ventricular Rate:  97 PR Interval:  170 QRS Duration: 88 QT Interval:  342 QTC Calculation: 434 R Axis:   14 Text Interpretation:  Normal sinus rhythm No significant change since last  tracing Confirmed by Ashok Cordia  MD, Lennette Bihari ([id number]) on 05/13/2014 5:18:37 PM      MDM   Final diagnoses:  None   54 year old female with a history of bipolar with psychotic episodes, CKD with fistula in place but not on dialysis, multiple admissions for altered mental status in the past with encephalopathy thought to be secondary to polypharmacy presents with concern of 3 days of altered mental status.  Code stroke was initiated in triage and neurology evaluated patient and felt that presentation was less likely consistent with  stroke and more likely consistent with other metabolic disturbance, however recommended inpatient MRI.  Doubt infectious etiology given no fever or leukocytosis. Levels of TSH, Depakote, ammonia, Tylenol, alcohol, aspirin were ordered and were significant  for salicylate level of 23.  This result returned as discussing admission with internal medicine.  Patient's creatinine and BUN are at baseline, however given altered mental status and salicylate level nephrology was consulted and did not feel there is a an acute need for dialysis. History is limited regarding salicylate use, and given that level is borderline elevated the level will be rechecked as an inpatient. Patient was admitted for continued evaluation and management of encephalopathy of unclear cause at this time, likely polypharmacy.    Alvino Chapel, MD 05/14/14 0130  Mirna Mires, MD 05/14/14 [phone]

## 2014-05-13 NOTE — ED Notes (Signed)
Checked patient blood sugar it was 74 notified RN Sarah of blood sugar

## 2014-05-13 NOTE — ED Notes (Signed)
Attempted to give report 

## 2014-05-13 NOTE — Progress Notes (Signed)
Unit CM UR Completed by [facility] ED CM  W. Kinslei Labine RN  

## 2014-05-13 NOTE — ED Notes (Signed)
Admitting MD at BS.  

## 2014-05-13 NOTE — ED Notes (Addendum)
Per daughter, pt was last seen normal at 83 when daughter found her slumped over on commode. Pt is altered and has slurred speech. Pt started acting combative and agitated 3 days ago. Per daughter she has a psych history but this morning was able to hold her coffee cup and was very shaky. Daughter left home at 1130 this morning.

## 2014-05-13 NOTE — ED Notes (Signed)
Bricelyn Freestone [phone] (daughter)

## 2014-05-13 NOTE — H&P (Addendum)
[facility] History and Physical  Jennifer Lawson VWU:[id number] DOB: 1961-03-17 DOA: 05/13/2014  Referring physician: ER physician. PCP: [facility]   History obtained from ER physician, previous records and patient's nurse as patient is confused and patient's daughter is unavailable at this time and not able to be reached.  Chief Complaint: Altered mental status.  HPI: Jennifer Lawson is a 54 y.o. female with history of chronic kidney disease stage V, hypertension, bipolar disorder, chronic anemia was brought to the ER after patient was found to be slumped in her toilet. As per the ER physician patient has been feeling increasingly drowsy over the last 3 days and today around 4:30 PM patient's daughter found her slumped in a toddler and was brought to the ER. Patient is very encephalopathic and lethargic. CT head did not show the acute and on initial presentation neurologist was consulted and neurologist has recommended MRI brain and to check for other causes including urine drug screen and medications. Salicylates levels came a little bit higher and 23 and ABG shows a pH of 7.2. I did discuss with pulmonary critical care about the ABG findings and pulmonary critical care feels that patient's ABG is showing a mixed picture of metabolic acidosis and alkalosis which could be from salicylate. I did discuss with on-call nephrologist Dr. Lorrene Reid and at this time since her levels are not very high there is no indication for dialysis but we will need to serial check salicylate levels.  Review of Systems: As presented in the history of presenting illness, rest negative.  Past Medical History  Diagnosis Date  . Mental disorder   . Depression   . Hypertension   . Overdose   . Tobacco use disorder 11/13/2012  . Complication of anesthesia     difficulty going to sleep  . Chronic kidney disease     06/11/13- not on dialysis  . Shortness of breath     lying down flat  . PTSD  (post-traumatic stress disorder)   . Asthma   . COPD (chronic obstructive pulmonary disease)   . Heart murmur   . GERD (gastroesophageal reflux disease)   . Seizures     "passsed out"  . History of blood transfusion   . Diabetes mellitus without complication     denies   Past Surgical History  Procedure Laterality Date  . Right knee replacement      she says it was last year.  . Esophagogastroduodenoscopy Left 11/14/2012    Procedure: ESOPHAGOGASTRODUODENOSCOPY (EGD);  Surgeon: [name], MD;  Location: [facility];  Service: Endoscopy;  Laterality: Left;  . Joint replacement Right     knee  . Parathyroidectomy    . Av fistula placement Right 06/12/2013    Procedure: ARTERIOVENOUS (AV) FISTULA CREATION; RIGHT  BASILIC VEIN TRANSPOSITION with Intraoperative ultrasound;  Surgeon: Mal [name], MD;  Location: [facility];  Service: Vascular;  Laterality: Right;   Social History:  reports that she has been smoking Cigarettes and Cigars.  She has a 80 pack-year smoking history. She has never used smokeless tobacco. She reports that she does not drink alcohol or use illicit drugs. Where does patient livehome. Can patient participate in ADLs? yes.  Allergies  Allergen Reactions  . Codeine Sulfate Anaphylaxis    Daughter called about having this allergy   . Haldol [Haloperidol Lactate] Shortness Of Breath  . Risperidone And Related Shortness Of Breath  . Gabapentin Other (See Comments)    seizures  . Lorayne Bender [Paliperidone  Er] Nausea And Vomiting  . Trazodone And Nefazodone Other (See Comments)    Makes pt loose balance and fall  . Vistaril [Hydroxyzine Hcl] Nausea And Vomiting    Family History:  Family History  Problem Relation Age of Onset  . Diabetes Mother   . Hyperlipidemia Mother   . Hypertension Mother   . Diabetes Father   . Hypertension Father   . Hyperlipidemia Father       Prior to Admission medications   Medication Sig Start Date End Date Taking? Authorizing  Provider  albuterol (PROVENTIL) (2.5 MG/3ML) 0.083% nebulizer solution Take 3 mLs (2.5 mg total) by nebulization every 4 (four) hours as needed for wheezing or shortness of breath. 04/03/14  Yes Encarnacion Slates, NP  Aspirin-Salicylamide-Caffeine (BC HEADACHE POWDER PO) Take 6 each by mouth daily as needed (back pain).   Yes Historical Provider, MD  diphenhydrAMINE (SOMINEX) 25 MG tablet Take 25-50 mg by mouth at bedtime as needed for sleep.   Yes Historical Provider, MD  divalproex (DEPAKOTE ER) 250 MG 24 hr tablet Take 3 tablets (750 mg total) by mouth at bedtime. For mood stabilization 04/03/14  Yes Encarnacion Slates, NP  divalproex (DEPAKOTE) 500 MG DR tablet Take 500 mg by mouth 2 (two) times daily.   Yes Historical Provider, MD  doxepin (SINEQUAN) 10 MG capsule Take 10 mg by mouth at bedtime as needed (insomnia).    Yes Historical Provider, MD  Fluticasone-Salmeterol (ADVAIR) 100-50 MCG/DOSE AEPB Inhale 1 puff into the lungs 2 (two) times daily. For asthma 04/03/14  Yes Encarnacion Slates, NP  furosemide (LASIX) 80 MG tablet Take 1 tablet (80 mg total) by mouth 2 (two) times daily. For swellings 04/03/14  Yes Encarnacion Slates, NP  Menthol, Topical Analgesic, (ICY HOT EX) Apply 1 application topically daily as needed (back pain).   Yes Historical Provider, MD  metoprolol tartrate (LOPRESSOR) 25 MG tablet Take 1 tablet (25 mg total) by mouth 2 (two) times daily. For HTN 04/03/14  Yes Encarnacion Slates, NP  azithromycin (ZITHROMAX Z-PAK) 250 MG tablet 2 po day one, then 1 daily x 4 days Patient not taking: Reported on 05/13/2014 04/13/14   Karen Chafe Molpus, MD  simvastatin (ZOCOR) 10 MG tablet Take 1 tablet (10 mg total) by mouth at bedtime. For high cholesterol/fats Patient not taking: Reported on 05/13/2014 04/03/14   Encarnacion Slates, NP  temazepam (RESTORIL) 7.5 MG capsule Take 1 capsule (7.5 mg total) by mouth at bedtime as needed for sleep. 04/03/14   Encarnacion Slates, NP  ziprasidone (GEODON) 60 MG capsule Take 1 capsule  (60 mg total) by mouth 2 (two) times daily with a meal. For mood control Patient taking differently: Take 60 mg by mouth at bedtime. For mood control 04/03/14   Encarnacion Slates, NP    Physical Exam: Filed Vitals:   05/13/14 1930 05/13/14 1945 05/13/14 2000 05/13/14 2015  BP: 133/76 145/82 146/92 144/74  Pulse: 105 109 111 110  Temp:      TempSrc:      Resp: 17 17 18 17   Height:      Weight:      SpO2: 93% 97% 96% 96%     General: Obese not in distress.  ENT: No discharge from the ears eyes nose more.  Neck: No neck rigidity or mass felt.  Cardiovascular: S1-S2 heard.  Respiratory: No rhonchi or crepitations.  Abdomen: Soft nontender bowel sounds present.  Skin: There is an ecchymotic area  in the left calf.  Musculoskeletal: Ecchymotic area in the left calf area.  Psychiatric: Patient is lethargic.  Neurologic: Patient is lethargic and further neurological assessment is difficult. Perla positive.  Labs on Admission:  Basic Metabolic Panel:  Recent Labs Lab 05/13/14 1722 05/13/14 1735  NA 142 141  K 5.1 5.1  CL 110 114*  CO2 19  --   GLUCOSE 77 72  BUN 60* 58*  CREATININE 4.80* 4.40*  CALCIUM 9.0  --    Liver Function Tests:  Recent Labs Lab 05/13/14 1722  AST 13  ALT 11  ALKPHOS 55  BILITOT 1.1  PROT 6.9  ALBUMIN 3.4*    Recent Labs Lab 05/13/14 1810  LIPASE 25    Recent Labs Lab 05/13/14 1808  AMMONIA 25   CBC:  Recent Labs Lab 05/13/14 1722 05/13/14 1735  WBC 4.3  --   NEUTROABS 2.9  --   HGB 9.4* 10.9*  HCT 30.7* 32.0*  MCV 97.2  --   PLT 165  --    Cardiac Enzymes: No results for input(s): CKTOTAL, CKMB, CKMBINDEX, TROPONINI in the last 168 hours.  BNP (last 3 results) No results for input(s): PROBNP in the last 8760 hours. CBG:  Recent Labs Lab 05/13/14 1700 05/13/14 1720  GLUCAP 73 74    Radiological Exams on Admission: Ct Head (brain) Wo Contrast  05/13/2014   CLINICAL DATA:  Altered mental status, urinary  retention  EXAM: CT HEAD WITHOUT CONTRAST  TECHNIQUE: Contiguous axial images were obtained from the base of the skull through the vertex without intravenous contrast.  COMPARISON:  01/15/2014 and brain MRI a 01/16/2014  FINDINGS: No skull fracture is noted. Again noted mild mucosal thickening posterior aspect of left maxillary sinus. The sphenoid sinuses and mastoid air cells are unremarkable. Frontal sinuses are unremarkable.  No intracranial hemorrhage, mass effect or midline shift. No acute cortical infarction. No mass lesion is noted on this unenhanced scan. The gray and white-matter differentiation is preserved.  IMPRESSION: No acute intracranial abnormality. Stable mild mucosal thickening posterior aspect of the left maxillary sinus. No definite acute cortical infarction.   Electronically Signed   By: Lahoma Crocker M.D.   On: 05/13/2014 17:27    EKG: Independently reviewed. Normal sinus rhythm.  Assessment/Plan Principal Problem:   Acute encephalopathy Active Problems:   Bipolar 1 disorder   CKD (chronic kidney disease) stage 5, GFR less than 15 ml/min   Mixed acid base balance disorder   Salicylate overdose   1. Acute encephalopathy with mixed acid base balance disorder - cause is not clear but could be medication related. Patient's salicylate levels are mildly elevated and at this time I have discussed with both pulmonary critical care and nephrologist. We will be checking serial salicylate levels to make sure it's not going up. Closely observe respiratory status and stepdown. Check MRI brain. At this time I'm holding off all patient's medications as patient is lethargic and cannot swallow. As per the charts patient also has history of alcoholism for which I have ordered thiamine level and place patient on thiamine IV. 2. Chronic kidney disease stage V - closely follow fluid status and metabolic panel. 3. Bipolar disorder - at this time holding off all medications since patient is  lethargic. 4. Chronic anemia - follow CBC. 5. History of asthma - closely not wheezing. 6. Left calf ecchymosis - closely observe. 7. Hypertension - presently patient cannot swallow and closely observe blood pressure trends.  Addendum - 4:15 AM on  05/14/2013 - patient has become mildly febrile for which chest x-ray UA and blood cultures have been ordered. Patient will be kept on empiric antibiotics for now.  DVT Prophylaxis heparin.  Code Status:  Full code.  Family Communication:  Unable to reach patient's daughter.  Disposition Plan:  Admit to inpatient.    Ziquan Fidel N. [facility] Pager [phone].  If 7PM-7AM, please contact night-coverage [email or web address] Password [email or web address] 05/13/2014, 9:10 PM

## 2014-05-13 NOTE — Consult Note (Addendum)
Consult Reason for Consult:altered mental status Referring Physician: Dr Ashok Cordia  CC: altered mental status  HPI: Jennifer Lawson is an 54 y.o. female with history of renal failure (with HD cath but not on HD for unclear reasons), bipolar disorder presenting with altered mental status. History provided via patients daughter who found her slumped over today at 1630. Per daughter the patient started getting lethargic and confused 3 days ago and has gotten progressively worse. The daughter notes she appeared weak all over today and was slurring her words. She also notes that her legs have gotten progressively more swollen the past few days. Her daughter states that the patient has had similar episodes a few times in the past, they have been found to be related to worsening of her renal function.   Head CT imaging reviewed and overall unremarkable.   Last known well 05/10/2014 at 1500 tPA not given due to being outside window   Past Medical History  Diagnosis Date  . Mental disorder   . Depression   . Hypertension   . Overdose   . Tobacco use disorder 11/13/2012  . Complication of anesthesia     difficulty going to sleep  . Chronic kidney disease     06/11/13- not on dialysis  . Shortness of breath     lying down flat  . PTSD (post-traumatic stress disorder)   . Asthma   . COPD (chronic obstructive pulmonary disease)   . Heart murmur   . GERD (gastroesophageal reflux disease)   . Seizures     "passsed out"  . History of blood transfusion   . Diabetes mellitus without complication     denies    Past Surgical History  Procedure Laterality Date  . Right knee replacement      she says it was last year.  . Esophagogastroduodenoscopy Left 11/14/2012    Procedure: ESOPHAGOGASTRODUODENOSCOPY (EGD);  Surgeon: [name], MD;  Location: [facility];  Service: Endoscopy;  Laterality: Left;  . Joint replacement Right     knee  . Parathyroidectomy    . Av fistula placement Right 06/12/2013     Procedure: ARTERIOVENOUS (AV) FISTULA CREATION; RIGHT  BASILIC VEIN TRANSPOSITION with Intraoperative ultrasound;  Surgeon: Mal [name], MD;  Location: [facility] OR;  Service: Vascular;  Laterality: Right;    Family History  Problem Relation Age of Onset  . Diabetes Mother   . Hyperlipidemia Mother   . Hypertension Mother   . Diabetes Father   . Hypertension Father   . Hyperlipidemia Father     Social History:  reports that she has been smoking Cigarettes and Cigars.  She has a 80 pack-year smoking history. She has never used smokeless tobacco. She reports that she does not drink alcohol or use illicit drugs.  Allergies  Allergen Reactions  . Codeine Sulfate Anaphylaxis    Daughter called about having this allergy   . Haldol [Haloperidol Lactate] Shortness Of Breath  . Risperidone And Related Shortness Of Breath  . Gabapentin Other (See Comments)    seizures  . Invega [Paliperidone Er] Nausea And Vomiting  . Trazodone And Nefazodone Other (See Comments)    Makes pt loose balance and fall  . Vistaril [Hydroxyzine Hcl] Nausea And Vomiting    Medications: Prior to Admission:  (Not in a hospital admission)   ROS: Out of a complete 14 system review, the patient complains of only the following symptoms, and all other reviewed systems are negative. +lethargy, confusion  Physical Examination: Filed Vitals:  05/13/14 1730  BP: 147/76  Pulse: 100  Temp: 97.8 F (36.6 C)  Resp: 17   Physical Exam  Constitutional: she appears well-developed and well-nourished.  Psych: Affect appropriate to situation Eyes: No scleral injection HENT: No OP obstrucion Head: Normocephalic.  Cardiovascular: Normal rate and regular rhythm.  Respiratory: Effort normal and breath sounds normal.  GI: Soft. Bowel sounds are normal. No distension. There is no tenderness.  Skin: severe bilateral LE edema  Neurologic Examination Mental Status: Lethargic, requires frequent stimulation to stay  awake. Oriented to name and month. Able to identify her daughter in the room. Intermittently follows simple commands. Minimal verbal output. Moderate dysarthria noted.  Cranial Nerves: II: unable to visualize fundi bilaterally, visual fields grossly normal, pupils equal, round, reactive to light III,IV, VI: ptosis not present, eyes midline, does not track but will moves eyes to right and left, no noted gaze preference V,VII: smile symmetric, facial light touch sensation normal bilaterally VIII: hearing normal bilaterally IX,X: unable to assess due to mental status XI: unable to assess due to mental status XII: tongue strength normal  Motor: Not following commands so unable to formally test but appears to move all extremities symmetrically and against noxious stimuli Tone and bulk:normal tone throughout; no atrophy noted Sensory: Pinprick  intact throughout, bilaterally Deep Tendon Reflexes: 1+ bilateral UE, absent patellar and achilles (marked edema) Plantars: Right: downgoing   Left: downgoing Cerebellar: Unable to test due to mental status Gait: unable to test due to mental status  Laboratory Studies:   Basic Metabolic Panel: No results for input(s): NA, K, CL, CO2, GLUCOSE, BUN, CREATININE, CALCIUM, MG, PHOS in the last 168 hours.  Liver Function Tests: No results for input(s): AST, ALT, ALKPHOS, BILITOT, PROT, ALBUMIN in the last 168 hours. No results for input(s): LIPASE, AMYLASE in the last 168 hours. No results for input(s): AMMONIA in the last 168 hours.  CBC: No results for input(s): WBC, NEUTROABS, HGB, HCT, MCV, PLT in the last 168 hours.  Cardiac Enzymes: No results for input(s): CKTOTAL, CKMB, CKMBINDEX, TROPONINI in the last 168 hours.  BNP: Invalid input(s): POCBNP  CBG:  Recent Labs Lab 05/13/14 1720  GLUCAP 41    Microbiology: Results for orders placed or performed during the hospital encounter of 02/05/14  Urine culture     Status: None   Collection  Time: 02/08/14  6:15 PM  Result Value Ref Range Status   Specimen Description   Final    URINE, CLEAN CATCH Performed at [facility]   Special Requests NONE Performed at [facility]  Final   Culture  Setup Time   Final    02/09/2014 14:13 Performed at [facility]   Final    20,OOO COLONIES/ML Performed at [facility]   Culture   Final    Multiple bacterial morphotypes present, none predominant. Suggest appropriate recollection if clinically indicated. Performed at [facility]   Report Status 02/11/2014 FINAL  Final    Coagulation Studies: No results for input(s): LABPROT, INR in the last 72 hours.  Urinalysis: No results for input(s): COLORURINE, LABSPEC, PHURINE, GLUCOSEU, HGBUR, BILIRUBINUR, KETONESUR, PROTEINUR, UROBILINOGEN, NITRITE, LEUKOCYTESUR in the last 168 hours.  Invalid input(s): APPERANCEUR  Lipid Panel:     Component Value Date/Time   CHOL 227* 02/06/2014 0635   TRIG 238* 02/06/2014 0635   HDL 62 02/06/2014 0635   CHOLHDL 3.7 02/06/2014 0635   VLDL 48* 02/06/2014 4193  [place] 117* 02/06/2014 0635    HgbA1C:  Lab Results  Component Value Date   HGBA1C 4.9 02/06/2014    Urine Drug Screen:      Component Value Date/Time   LABOPIA NONE DETECTED 04/02/2014 1835   COCAINSCRNUR NONE DETECTED 04/02/2014 1835   LABBENZ NONE DETECTED 04/02/2014 1835   AMPHETMU NONE DETECTED 04/02/2014 1835   THCU NONE DETECTED 04/02/2014 1835   LABBARB NONE DETECTED 04/02/2014 1835    Alcohol Level: No results for input(s): ETH in the last 168 hours.  Other results:  Imaging: Ct Head (brain) Wo Contrast  05/13/2014   CLINICAL DATA:  Altered mental status, urinary retention  EXAM: CT HEAD WITHOUT CONTRAST  TECHNIQUE: Contiguous axial images were obtained from the base of the skull through the vertex without intravenous contrast.  COMPARISON:  01/15/2014 and brain MRI a 01/16/2014  FINDINGS:  No skull fracture is noted. Again noted mild mucosal thickening posterior aspect of left maxillary sinus. The sphenoid sinuses and mastoid air cells are unremarkable. Frontal sinuses are unremarkable.  No intracranial hemorrhage, mass effect or midline shift. No acute cortical infarction. No mass lesion is noted on this unenhanced scan. The gray and white-matter differentiation is preserved.  IMPRESSION: No acute intracranial abnormality. Stable mild mucosal thickening posterior aspect of the left maxillary sinus. No definite acute cortical infarction.   Electronically Signed   By: Lahoma Crocker M.D.   On: 05/13/2014 17:27     Assessment/Plan:  54y/o women with history of renal failure, bipolar disorder presenting for evaluation of altered mental status. Symptoms have been getting progressively worse for past 2-3 days. Suspect symptoms are likely multifactorial and related to worsening of her renal failure and polypharmacy. Cannot rule out a vascular process (specifically posterior circulation) though based on lack of focal findings on exam this appears less likely. Afebrile with no leukocytosis makes infectious etiology less likely. Could potentially represent a seizure/post-ictal state.   -check MRI brain. If positive complete rest of stroke workup -may need nephrology consult to determine if patient is candidate to start HD -check ammonia, liver function, valproic acid level, UA, UDS and TSH -check CXR and ABG -check EEG   Jim Like, DO [facility] [phone]  If 7pm- 7am, please page neurology on call as listed in AMION. 05/13/2014, 5:37 PM

## 2014-05-14 ENCOUNTER — Inpatient Hospital Stay (HOSPITAL_COMMUNITY): Payer: Medicare Other

## 2014-05-14 DIAGNOSIS — G9340 Encephalopathy, unspecified: Secondary | ICD-10-CM

## 2014-05-14 DIAGNOSIS — R609 Edema, unspecified: Secondary | ICD-10-CM

## 2014-05-14 LAB — POCT I-STAT 3, ART BLOOD GAS (G3+)
Acid-base deficit: 9 mmol/L — ABNORMAL HIGH (ref 0.0–2.0)
Bicarbonate: 20.6 mEq/L (ref 20.0–24.0)
O2 Saturation: 100 %
Patient temperature: 99.7
TCO2: 23 mmol/L (ref 0–100)
pCO2 arterial: 64.8 mmHg (ref 35.0–45.0)
pH, Arterial: 7.115 — CL (ref 7.350–7.450)
pO2, Arterial: 341 mmHg — ABNORMAL HIGH (ref 80.0–100.0)

## 2014-05-14 LAB — LIPID PANEL
Cholesterol: 179 mg/dL (ref 0–200)
HDL: 53 mg/dL (ref >39)
LDL Cholesterol: 88 mg/dL (ref 0–99)
Total CHOL/HDL Ratio: 3.4 RATIO
Triglycerides: 190 mg/dL — ABNORMAL HIGH (ref <150)
VLDL: 38 mg/dL (ref 0–40)

## 2014-05-14 LAB — CBC WITH DIFFERENTIAL/PLATELET
Basophils Absolute: 0 10*3/uL (ref 0.0–0.1)
Basophils Relative: 0 % (ref 0–1)
Eosinophils Absolute: 0.1 10*3/uL (ref 0.0–0.7)
Eosinophils Relative: 2 % (ref 0–5)
HCT: 29.9 % — ABNORMAL LOW (ref 36.0–46.0)
Hemoglobin: 9.2 g/dL — ABNORMAL LOW (ref 12.0–15.0)
Lymphocytes Relative: 15 % (ref 12–46)
Lymphs Abs: 0.7 10*3/uL (ref 0.7–4.0)
MCH: 31 pg (ref 26.0–34.0)
MCHC: 30.8 g/dL (ref 30.0–36.0)
MCV: 100.7 fL — ABNORMAL HIGH (ref 78.0–100.0)
Monocytes Absolute: 0.5 10*3/uL (ref 0.1–1.0)
Monocytes Relative: 12 % (ref 3–12)
Neutro Abs: 3.2 10*3/uL (ref 1.7–7.7)
Neutrophils Relative %: 71 % (ref 43–77)
Platelets: 146 10*3/uL — ABNORMAL LOW (ref 150–400)
RBC: 2.97 MIL/uL — ABNORMAL LOW (ref 3.87–5.11)
RDW: 16.8 % — ABNORMAL HIGH (ref 11.5–15.5)
WBC: 4.4 10*3/uL (ref 4.0–10.5)

## 2014-05-14 LAB — LACTIC ACID, PLASMA: Lactic Acid, Venous: 1.1 mmol/L (ref 0.5–2.2)

## 2014-05-14 LAB — COMPREHENSIVE METABOLIC PANEL
ALT: 9 U/L (ref 0–35)
AST: 11 U/L (ref 0–37)
Albumin: 2.8 g/dL — ABNORMAL LOW (ref 3.5–5.2)
Alkaline Phosphatase: 53 U/L (ref 39–117)
Anion gap: 11 (ref 5–15)
BUN: 64 mg/dL — ABNORMAL HIGH (ref 6–23)
CO2: 16 mmol/L — ABNORMAL LOW (ref 19–32)
Calcium: 9 mg/dL (ref 8.4–10.5)
Chloride: 117 mEq/L — ABNORMAL HIGH (ref 96–112)
Creatinine, Ser: 5.12 mg/dL — ABNORMAL HIGH (ref 0.50–1.10)
GFR calc Af Amer: 10 mL/min — ABNORMAL LOW (ref >90)
GFR calc non Af Amer: 9 mL/min — ABNORMAL LOW (ref >90)
Glucose, Bld: 120 mg/dL — ABNORMAL HIGH (ref 70–99)
Potassium: 4.5 mmol/L (ref 3.5–5.1)
Sodium: 144 mmol/L (ref 135–145)
Total Bilirubin: 1.1 mg/dL (ref 0.3–1.2)
Total Protein: 6 g/dL (ref 6.0–8.3)

## 2014-05-14 LAB — BASIC METABOLIC PANEL
Anion gap: 11 (ref 5–15)
BUN: 40 mg/dL — ABNORMAL HIGH (ref 6–23)
CO2: 24 mmol/L (ref 19–32)
Calcium: 9.3 mg/dL (ref 8.4–10.5)
Chloride: 107 mEq/L (ref 96–112)
Creatinine, Ser: 4.01 mg/dL — ABNORMAL HIGH (ref 0.50–1.10)
GFR calc Af Amer: 14 mL/min — ABNORMAL LOW (ref >90)
GFR calc non Af Amer: 12 mL/min — ABNORMAL LOW (ref >90)
Glucose, Bld: 99 mg/dL (ref 70–99)
Potassium: 3.3 mmol/L — ABNORMAL LOW (ref 3.5–5.1)
Sodium: 142 mmol/L (ref 135–145)

## 2014-05-14 LAB — BLOOD GAS, ARTERIAL
Acid-Base Excess: 1 mmol/L (ref 0.0–2.0)
Acid-base deficit: 7.2 mmol/L — ABNORMAL HIGH (ref 0.0–2.0)
Bicarbonate: 19.3 mEq/L — ABNORMAL LOW (ref 20.0–24.0)
Bicarbonate: 24.5 mEq/L — ABNORMAL HIGH (ref 20.0–24.0)
Drawn by: 331471
Drawn by: 398661
FIO2: 0.4 %
MECHVT: 400 mL
O2 Content: 1.5 L/min
O2 Saturation: 96.3 %
O2 Saturation: 82.4 %
PEEP: 5 cmH2O
Patient temperature: 98.6
Patient temperature: 99.9
RATE: 30 resp/min
TCO2: 20.8 mmol/L (ref 0–100)
TCO2: 25.5 mmol/L (ref 0–100)
pCO2 arterial: 49.2 mmHg — ABNORMAL HIGH (ref 35.0–45.0)
pCO2 arterial: 35.9 mmHg (ref 35.0–45.0)
pH, Arterial: 7.217 — ABNORMAL LOW (ref 7.350–7.450)
pH, Arterial: 7.452 — ABNORMAL HIGH (ref 7.350–7.450)
pO2, Arterial: 89 mmHg (ref 80.0–100.0)
pO2, Arterial: 43.1 mmHg — ABNORMAL LOW (ref 80.0–100.0)

## 2014-05-14 LAB — URINALYSIS, ROUTINE W REFLEX MICROSCOPIC
Bilirubin Urine: NEGATIVE
Glucose, UA: NEGATIVE mg/dL
Hgb urine dipstick: NEGATIVE
Ketones, ur: NEGATIVE mg/dL
Nitrite: NEGATIVE
Protein, ur: >300 mg/dL — AB
Specific Gravity, Urine: 1.013 (ref 1.005–1.030)
Urobilinogen, UA: 0.2 mg/dL (ref 0.0–1.0)
pH: 5.5 (ref 5.0–8.0)

## 2014-05-14 LAB — GLUCOSE, CAPILLARY
Glucose-Capillary: 102 mg/dL — ABNORMAL HIGH (ref 70–99)
Glucose-Capillary: 113 mg/dL — ABNORMAL HIGH (ref 70–99)
Glucose-Capillary: 68 mg/dL — ABNORMAL LOW (ref 70–99)
Glucose-Capillary: 84 mg/dL (ref 70–99)
Glucose-Capillary: 86 mg/dL (ref 70–99)
Glucose-Capillary: 87 mg/dL (ref 70–99)
Glucose-Capillary: 94 mg/dL (ref 70–99)
Glucose-Capillary: 96 mg/dL (ref 70–99)

## 2014-05-14 LAB — IRON AND TIBC
Iron: 19 ug/dL — ABNORMAL LOW (ref 42–145)
Saturation Ratios: 8 % — ABNORMAL LOW (ref 20–55)
TIBC: 248 ug/dL — ABNORMAL LOW (ref 250–470)
UIBC: 229 ug/dL (ref 125–400)

## 2014-05-14 LAB — RAPID URINE DRUG SCREEN, HOSP PERFORMED
Amphetamines: NOT DETECTED
Barbiturates: NOT DETECTED
Benzodiazepines: NOT DETECTED
Cocaine: NOT DETECTED
Opiates: NOT DETECTED
Tetrahydrocannabinol: NOT DETECTED

## 2014-05-14 LAB — SALICYLATE LEVEL
Salicylate Lvl: 16.1 mg/dL (ref 2.8–20.0)
Salicylate Lvl: 19.4 mg/dL (ref 2.8–20.0)

## 2014-05-14 LAB — PROCALCITONIN: Procalcitonin: <0.1 ng/mL

## 2014-05-14 LAB — HEMOGLOBIN A1C
Hgb A1c MFr Bld: 5 % (ref <5.7)
Mean Plasma Glucose: 97 mg/dL (ref <117)

## 2014-05-14 LAB — HEPATITIS B SURFACE ANTIGEN: Hepatitis B Surface Ag: NEGATIVE

## 2014-05-14 LAB — AMMONIA: Ammonia: 11 umol/L (ref 11–32)

## 2014-05-14 LAB — TSH: TSH: 0.42 u[IU]/mL (ref 0.350–4.500)

## 2014-05-14 LAB — FERRITIN: Ferritin: 18 ng/mL (ref 10–291)

## 2014-05-14 LAB — URINE MICROSCOPIC-ADD ON

## 2014-05-14 LAB — MRSA PCR SCREENING: MRSA by PCR: NEGATIVE

## 2014-05-14 LAB — VALPROIC ACID LEVEL: Valproic Acid Lvl: 41 ug/mL — ABNORMAL LOW (ref 50.0–100.0)

## 2014-05-14 MED ORDER — NEPRO/CARBSTEADY PO LIQD: 237.0000 mL | ORAL | Status: DC | PRN

## 2014-05-14 MED ORDER — LIDOCAINE-PRILOCAINE 2.5-2.5 % EX CREA: 1.0000 "application " | TOPICAL_CREAM | CUTANEOUS | Status: DC | PRN

## 2014-05-14 MED ORDER — PENTAFLUOROPROP-TETRAFLUOROETH EX AERO: 1.0000 "application " | INHALATION_SPRAY | CUTANEOUS | Status: DC | PRN

## 2014-05-14 MED ORDER — ETOMIDATE 2 MG/ML IV SOLN
INTRAVENOUS | Status: AC
  Filled 2014-05-14: qty 10

## 2014-05-14 MED ORDER — PANTOPRAZOLE SODIUM 40 MG IV SOLR
40.0000 mg | INTRAVENOUS | Status: DC
  Administered 2014-05-14 – 2014-05-17 (×4): 40 mg via INTRAVENOUS
  Filled 2014-05-14 (×4): qty 40

## 2014-05-14 MED ORDER — SODIUM CHLORIDE 0.9 % IV SOLN
25.0000 ug/h | INTRAVENOUS | Status: DC
  Administered 2014-05-14 – 2014-05-15 (×2): 50 ug/h via INTRAVENOUS
  Administered 2014-05-15: 300 ug/h via INTRAVENOUS
  Filled 2014-05-14 (×2): qty 50

## 2014-05-14 MED ORDER — IPRATROPIUM-ALBUTEROL 0.5-2.5 (3) MG/3ML IN SOLN
3.0000 mL | Freq: Four times a day (QID) | RESPIRATORY_TRACT | Status: DC
  Administered 2014-05-14 – 2014-05-17 (×11): 3 mL via RESPIRATORY_TRACT
  Filled 2014-05-14 (×13): qty 3

## 2014-05-14 MED ORDER — SODIUM CHLORIDE 0.9 % IV SOLN
10.0000 ug/h | INTRAVENOUS | Status: DC
  Filled 2014-05-14: qty 50

## 2014-05-14 MED ORDER — DEXTROSE 50 % IV SOLN
25.0000 mL | Freq: Once | INTRAVENOUS | Status: AC
  Administered 2014-05-14: 25 mL via INTRAVENOUS

## 2014-05-14 MED ORDER — MIDAZOLAM HCL 2 MG/2ML IJ SOLN
1.0000 mg | INTRAMUSCULAR | Status: DC | PRN
  Administered 2014-05-14 – 2014-05-15 (×3): 1 mg via INTRAVENOUS
  Filled 2014-05-14 (×3): qty 2

## 2014-05-14 MED ORDER — SODIUM BICARBONATE 8.4 % IV SOLN
INTRAVENOUS | Status: DC
  Administered 2014-05-14 – 2014-05-15 (×3): via INTRAVENOUS
  Filled 2014-05-14 (×4): qty 150

## 2014-05-14 MED ORDER — VALPROATE SODIUM 500 MG/5ML IV SOLN
500.0000 mg | Freq: Two times a day (BID) | INTRAVENOUS | Status: DC
  Administered 2014-05-14 – 2014-05-18 (×9): 500 mg via INTRAVENOUS
  Filled 2014-05-14 (×12): qty 5

## 2014-05-14 MED ORDER — HEPARIN SODIUM (PORCINE) 1000 UNIT/ML DIALYSIS: 40.0000 [IU]/kg | INTRAMUSCULAR | Status: DC | PRN

## 2014-05-14 MED ORDER — THIAMINE HCL 100 MG/ML IJ SOLN
100.0000 mg | Freq: Every day | INTRAMUSCULAR | Status: DC
  Administered 2014-05-14 – 2014-05-17 (×4): 100 mg via INTRAVENOUS
  Filled 2014-05-14 (×4): qty 1

## 2014-05-14 MED ORDER — DEXTROSE 5 % IV SOLN
1.0000 g | INTRAVENOUS | Status: DC
  Administered 2014-05-14: 1 g via INTRAVENOUS
  Filled 2014-05-14: qty 1

## 2014-05-14 MED ORDER — CEFTRIAXONE SODIUM IN DEXTROSE 20 MG/ML IV SOLN
1.0000 g | INTRAVENOUS | Status: DC
  Administered 2014-05-15: 1 g via INTRAVENOUS
  Filled 2014-05-14: qty 50

## 2014-05-14 MED ORDER — CETYLPYRIDINIUM CHLORIDE 0.05 % MT LIQD
7.0000 mL | Freq: Two times a day (BID) | OROMUCOSAL | Status: DC
  Administered 2014-05-14 – 2014-05-15 (×4): 7 mL via OROMUCOSAL

## 2014-05-14 MED ORDER — HEPARIN SODIUM (PORCINE) 1000 UNIT/ML DIALYSIS
1000.0000 [IU] | INTRAMUSCULAR | Status: DC | PRN
  Filled 2014-05-14: qty 1

## 2014-05-14 MED ORDER — DEXTROSE 5 % IV SOLN
INTRAVENOUS | Status: DC
  Administered 2014-05-14: 06:00:00 via INTRAVENOUS

## 2014-05-14 MED ORDER — MIDAZOLAM HCL 2 MG/2ML IJ SOLN: 1.0000 mg | INTRAMUSCULAR | Status: DC | PRN

## 2014-05-14 MED ORDER — FENTANYL CITRATE 0.05 MG/ML IJ SOLN
100.0000 ug | INTRAMUSCULAR | Status: DC | PRN
  Administered 2014-05-14: 100 ug via INTRAVENOUS
  Administered 2014-05-14 (×2): 50 ug via INTRAVENOUS
  Administered 2014-05-16: 100 ug via INTRAVENOUS
  Filled 2014-05-14 (×3): qty 2

## 2014-05-14 MED ORDER — VANCOMYCIN HCL IN DEXTROSE 1-5 GM/200ML-% IV SOLN
1000.0000 mg | INTRAVENOUS | Status: DC
  Administered 2014-05-14: 1000 mg via INTRAVENOUS
  Filled 2014-05-14: qty 200

## 2014-05-14 MED ORDER — ETOMIDATE 2 MG/ML IV SOLN
10.0000 mg | Freq: Once | INTRAVENOUS | Status: AC
  Administered 2014-05-14: 10 mg via INTRAVENOUS

## 2014-05-14 MED ORDER — MIDAZOLAM HCL 2 MG/2ML IJ SOLN
2.0000 mg | Freq: Once | INTRAMUSCULAR | Status: AC
  Administered 2014-05-14: 2 mg via INTRAVENOUS

## 2014-05-14 MED ORDER — FENTANYL BOLUS VIA INFUSION
50.0000 ug | INTRAVENOUS | Status: DC | PRN
  Administered 2014-05-14 – 2014-05-15 (×4): 50 ug via INTRAVENOUS
  Filled 2014-05-14: qty 50

## 2014-05-14 MED ORDER — ALTEPLASE 2 MG IJ SOLR: 2.0000 mg | Freq: Once | INTRAMUSCULAR | Status: AC | PRN

## 2014-05-14 MED ORDER — SODIUM CHLORIDE 0.9 % IV SOLN: 100.0000 mL | INTRAVENOUS | Status: DC | PRN

## 2014-05-14 MED ORDER — CETYLPYRIDINIUM CHLORIDE 0.05 % MT LIQD
7.0000 mL | Freq: Four times a day (QID) | OROMUCOSAL | Status: DC
  Administered 2014-05-14 – 2014-05-16 (×7): 7 mL via OROMUCOSAL

## 2014-05-14 MED ORDER — LIDOCAINE HCL (PF) 1 % IJ SOLN: 5.0000 mL | INTRAMUSCULAR | Status: DC | PRN

## 2014-05-14 MED ORDER — FENTANYL CITRATE 0.05 MG/ML IJ SOLN
INTRAMUSCULAR | Status: AC
  Administered 2014-05-14: 100 ug via INTRAVENOUS
  Filled 2014-05-14: qty 4

## 2014-05-14 MED ORDER — PIPERACILLIN-TAZOBACTAM 3.375 G IVPB
3.3750 g | Freq: Three times a day (TID) | INTRAVENOUS | Status: DC
  Filled 2014-05-14 (×2): qty 50

## 2014-05-14 MED ORDER — CHLORHEXIDINE GLUCONATE 0.12 % MT SOLN
15.0000 mL | Freq: Two times a day (BID) | OROMUCOSAL | Status: DC
  Administered 2014-05-14 – 2014-05-16 (×5): 15 mL via OROMUCOSAL
  Filled 2014-05-14 (×4): qty 15

## 2014-05-14 MED ORDER — DEXTROSE 50 % IV SOLN
INTRAVENOUS | Status: AC
  Filled 2014-05-14: qty 50

## 2014-05-14 MED ORDER — FENTANYL CITRATE 0.05 MG/ML IJ SOLN
100.0000 ug | INTRAMUSCULAR | Status: AC | PRN
  Administered 2014-05-14: 50 ug via INTRAVENOUS
  Administered 2014-05-14: 100 ug via INTRAVENOUS
  Administered 2014-05-14: 50 ug via INTRAVENOUS

## 2014-05-14 MED ORDER — HEPARIN SODIUM (PORCINE) 1000 UNIT/ML DIALYSIS
40.0000 [IU]/kg | INTRAMUSCULAR | Status: DC | PRN
  Filled 2014-05-14: qty 4

## 2014-05-14 MED ORDER — MIDAZOLAM HCL 2 MG/2ML IJ SOLN
INTRAMUSCULAR | Status: AC
  Administered 2014-05-14: 2 mg
  Filled 2014-05-14: qty 4

## 2014-05-14 NOTE — Evaluation (Signed)
Clinical/Bedside Swallow Evaluation Patient Details  Name: Jennifer Lawson MRN: [id number] Date of Birth: 05/05/1961  Today's Date: 05/14/2014 Time: 8182-9937 SLP Time Calculation (min) (ACUTE ONLY): 7 min  Past Medical History:  Past Medical History  Diagnosis Date  . Mental disorder   . Depression   . Hypertension   . Overdose   . Tobacco use disorder 11/13/2012  . Complication of anesthesia     difficulty going to sleep  . Chronic kidney disease     06/11/13- not on dialysis  . Shortness of breath     lying down flat  . PTSD (post-traumatic stress disorder)   . Asthma   . COPD (chronic obstructive pulmonary disease)   . Heart murmur   . GERD (gastroesophageal reflux disease)   . Seizures     "passsed out"  . History of blood transfusion   . Diabetes mellitus without complication     denies   Past Surgical History:  Past Surgical History  Procedure Laterality Date  . Right knee replacement      she says it was last year.  . Esophagogastroduodenoscopy Left 11/14/2012    Procedure: ESOPHAGOGASTRODUODENOSCOPY (EGD);  Surgeon: [name], MD;  Location: [facility];  Service: Endoscopy;  Laterality: Left;  . Joint replacement Right     knee  . Parathyroidectomy    . Av fistula placement Right 06/12/2013    Procedure: ARTERIOVENOUS (AV) FISTULA CREATION; RIGHT  BASILIC VEIN TRANSPOSITION with Intraoperative ultrasound;  Surgeon: Mal [name], MD;  Location: [facility];  Service: Vascular;  Laterality: Right;   HPI:  Jennifer Lawson is a 54 y.o. female with history of chronic kidney disease stage V, hypertension, bipolar disorder, chronic anemia admitted for AMS. Patient is very encephalopathic and lethargic upon admission. Etiology unclear, possibly medication related. No acute process on MRI. CXR shows mild vascular congestion.    Assessment / Plan / Recommendation Clinical Impression  SLP used repositioning, verbal tactile stim, oral care to briefly arouse pt. Despite  eyes open, pt did not respond to tactile cues to elicit response to ice touched to lips, tongue tip. SLP provided max cues to encourage pt to clear standing secretions audible in upper airway, pt would not cough or swallow. Pt arousal not appopriate for PO and question if pt is protecting airway sufficiently. RN aware. Will follow for readiness.     Aspiration Risk  Severe    Diet Recommendation NPO   Medication Administration: Via alternative means    Other  Recommendations     Follow Up Recommendations       Frequency and Duration min 2x/week  2 weeks   Pertinent Vitals/Pain NA    SLP Swallow Goals     Swallow Study Prior Functional Status       General HPI: Jennifer Lawson is a 54 y.o. female with history of chronic kidney disease stage V, hypertension, bipolar disorder, chronic anemia admitted for AMS. Patient is very encephalopathic and lethargic upon admission. Etiology unclear, possibly medication related. No acute process on MRI. CXR shows mild vascular congestion.  Type of Study: Bedside swallow evaluation Previous Swallow Assessment: 9/15 - soft solids, thin liquids until dentures available.  Diet Prior to this Study: NPO Temperature Spikes Noted: No Respiratory Status: Nasal cannula History of Recent Intubation: No Behavior/Cognition: Lethargic Oral Cavity - Dentition: Edentulous Self-Feeding Abilities: Total assist Patient Positioning: Upright in bed Baseline Vocal Quality: Low vocal intensity Volitional Cough: Weak    Oral/Motor/Sensory Function Overall Oral Motor/Sensory  Function: Impaired (symmetric, but unable to seal lips)   Ice Chips Ice chips: Impaired Presentation: Spoon Oral Phase Impairments: Poor awareness of bolus   Thin Liquid Thin Liquid: Not tested    Nectar Thick Nectar Thick Liquid: Not tested   Honey Thick Honey Thick Liquid: Not tested   Puree Puree: Not tested   Solid   GO    Solid: Not tested      Herbie Baltimore, MA CCC-SLP  [phone]  Jennifer Lawson 05/14/2014,9:08 AM

## 2014-05-14 NOTE — Progress Notes (Signed)
Hypoglycemic Event  CBG: 68  Treatment: D50 IV 25 mL  Symptoms: None  Follow-up CBG: Time: 0642 CBG Result:113  Possible Reasons for Event: Inadequate meal intake  Comments/MD notified: Dr. Eliane Decree, Vaughan Basta  Remember to initiate Hypoglycemia Order Set & complete

## 2014-05-14 NOTE — Consult Note (Signed)
Reason for Consult: Acute renal failure on chronic kidney disease stage V-possible ESRD Referring Physician: Chesley Mires M.D. (CCM)  HPI:  [name] with past medical history significant for progressive chronic kidney disease-stage V status post right brachiocephalic fistula placement last year, history of bipolar disorder as well as other complicating mental health problems who was brought to the emergency room after found to be slumped over in her toilet this morning after 3 days of progressively worsening drowsiness. On initial evaluation, she was found to have worsening renal function with multiple metabolic derangements that have now resulted in respiratory failure requiring intubation.  Past Medical History  Diagnosis Date  . Mental disorder   . Depression   . Hypertension   . Overdose   . Tobacco use disorder 11/13/2012  . Complication of anesthesia     difficulty going to sleep  . Chronic kidney disease     06/11/13- not on dialysis  . Shortness of breath     lying down flat  . PTSD (post-traumatic stress disorder)   . Asthma   . COPD (chronic obstructive pulmonary disease)   . Heart murmur   . GERD (gastroesophageal reflux disease)   . Seizures     "passsed out"  . History of blood transfusion   . Diabetes mellitus without complication     denies    Past Surgical History  Procedure Laterality Date  . Right knee replacement      she says it was last year.  . Esophagogastroduodenoscopy Left 11/14/2012    Procedure: ESOPHAGOGASTRODUODENOSCOPY (EGD);  Surgeon: [name], MD;  Location: [facility];  Service: Endoscopy;  Laterality: Left;  . Joint replacement Right     knee  . Parathyroidectomy    . Av fistula placement Right 06/12/2013    Procedure: ARTERIOVENOUS (AV) FISTULA CREATION; RIGHT  BASILIC VEIN TRANSPOSITION with Intraoperative ultrasound;  Surgeon: Mal [name], MD;  Location: [facility] OR;  Service: Vascular;  Laterality: Right;    Family  History  Problem Relation Age of Onset  . Diabetes Mother   . Hyperlipidemia Mother   . Hypertension Mother   . Diabetes Father   . Hypertension Father   . Hyperlipidemia Father     Social History:  reports that she has been smoking Cigarettes and Cigars.  She has a 80 pack-year smoking history. She has never used smokeless tobacco. She reports that she does not drink alcohol or use illicit drugs.  Allergies:  Allergies  Allergen Reactions  . Codeine Sulfate Anaphylaxis    Daughter called about having this allergy   . Haldol [Haloperidol Lactate] Shortness Of Breath  . Risperidone And Related Shortness Of Breath  . Gabapentin Other (See Comments)    seizures  . Invega [Paliperidone Er] Nausea And Vomiting  . Trazodone And Nefazodone Other (See Comments)    Makes pt loose balance and fall  . Vistaril [Hydroxyzine Hcl] Nausea And Vomiting    Medications:  Scheduled: . antiseptic oral rinse  7 mL Mouth Rinse BID  . antiseptic oral rinse  7 mL Mouth Rinse QID  . [START ON 05/15/2014] cefTRIAXone (ROCEPHIN)  IV  1 g Intravenous Q24H  . chlorhexidine  15 mL Mouth Rinse BID  . fentaNYL      . heparin  5,000 Units Subcutaneous 3 times per day  . ipratropium-albuterol  3 mL Nebulization Q6H  . midazolam      . pantoprazole (PROTONIX) IV  40 mg Intravenous Q24H  . sodium chloride  3  mL Intravenous Q12H  . thiamine IV  100 mg Intravenous Daily  . valproate sodium  500 mg Intravenous Q12H    Results for orders placed or performed during the hospital encounter of 05/13/14 (from the past 48 hour(s))  CBG monitoring, ED     Status: None   Collection Time: 05/13/14  5:00 PM  Result Value Ref Range   Glucose-Capillary 73 70 - 99 mg/dL  CBG monitoring, ED     Status: None   Collection Time: 05/13/14  5:20 PM  Result Value Ref Range   Glucose-Capillary 74 70 - 99 mg/dL  Protime-INR     Status: None   Collection Time: 05/13/14  5:22 PM  Result Value Ref Range   Prothrombin Time 14.8  11.6 - 15.2 seconds   INR 1.15 0.00 - 1.49  APTT     Status: Abnormal   Collection Time: 05/13/14  5:22 PM  Result Value Ref Range   aPTT 38 (H) 24 - 37 seconds    Comment:        IF BASELINE aPTT IS ELEVATED, SUGGEST PATIENT RISK ASSESSMENT BE USED TO DETERMINE APPROPRIATE ANTICOAGULANT THERAPY.   CBC     Status: Abnormal   Collection Time: 05/13/14  5:22 PM  Result Value Ref Range   WBC 4.3 4.0 - 10.5 K/uL   RBC 3.16 (L) 3.87 - 5.11 MIL/uL   Hemoglobin 9.4 (L) 12.0 - 15.0 g/dL   HCT 30.7 (L) 36.0 - 46.0 %   MCV 97.2 78.0 - 100.0 fL   MCH 29.7 26.0 - 34.0 pg   MCHC 30.6 30.0 - 36.0 g/dL   RDW 16.5 (H) 11.5 - 15.5 %   Platelets 165 150 - 400 K/uL  Differential     Status: Abnormal   Collection Time: 05/13/14  5:22 PM  Result Value Ref Range   Neutrophils Relative % 69 43 - 77 %   Neutro Abs 2.9 1.7 - 7.7 K/uL   Lymphocytes Relative 14 12 - 46 %   Lymphs Abs 0.6 (L) 0.7 - 4.0 K/uL   Monocytes Relative 14 (H) 3 - 12 %   Monocytes Absolute 0.6 0.1 - 1.0 K/uL   Eosinophils Relative 3 0 - 5 %   Eosinophils Absolute 0.1 0.0 - 0.7 K/uL   Basophils Relative 0 0 - 1 %   Basophils Absolute 0.0 0.0 - 0.1 K/uL  Comprehensive metabolic panel     Status: Abnormal   Collection Time: 05/13/14  5:22 PM  Result Value Ref Range   Sodium 142 135 - 145 mmol/L    Comment: Please note change in reference range.   Potassium 5.1 3.5 - 5.1 mmol/L    Comment: Please note change in reference range.   Chloride 110 96 - 112 mEq/L   CO2 19 19 - 32 mmol/L   Glucose, Bld 77 70 - 99 mg/dL   BUN 60 (H) 6 - 23 mg/dL   Creatinine, Ser 4.80 (H) 0.50 - 1.10 mg/dL   Calcium 9.0 8.4 - 10.5 mg/dL   Total Protein 6.9 6.0 - 8.3 g/dL   Albumin 3.4 (L) 3.5 - 5.2 g/dL   AST 13 0 - 37 U/L   ALT 11 0 - 35 U/L   Alkaline Phosphatase 55 39 - 117 U/L   Total Bilirubin 1.1 0.3 - 1.2 mg/dL   GFR calc non Af Amer 9 (L) >90 mL/min   GFR calc Af Amer 11 (L) >90 mL/min    Comment: (NOTE) The  eGFR has been  calculated using the CKD EPI equation. This calculation has not been validated in all clinical situations. eGFR's persistently <90 mL/min signify possible Chronic Kidney Disease.    Anion gap 13 5 - 15  I-stat troponin, ED (not at [facility])     Status: None   Collection Time: 05/13/14  5:33 PM  Result Value Ref Range   Troponin i, poc 0.02 0.00 - 0.08 ng/mL   Comment 3            Comment: Due to the release kinetics of cTnI, a negative result within the first hours of the onset of symptoms does not rule out myocardial infarction with certainty. If myocardial infarction is still suspected, repeat the test at appropriate intervals.   I-Stat Chem 8, ED     Status: Abnormal   Collection Time: 05/13/14  5:35 PM  Result Value Ref Range   Sodium 141 135 - 145 mmol/L   Potassium 5.1 3.5 - 5.1 mmol/L   Chloride 114 (H) 96 - 112 mEq/L   BUN 58 (H) 6 - 23 mg/dL   Creatinine, Ser 4.40 (H) 0.50 - 1.10 mg/dL   Glucose, Bld 72 70 - 99 mg/dL   Calcium, Ion 1.14 1.12 - 1.23 mmol/L   TCO2 18 0 - 100 mmol/L   Hemoglobin 10.9 (L) 12.0 - 15.0 g/dL   HCT 32.0 (L) 36.0 - 46.0 %  Ammonia     Status: None   Collection Time: 05/13/14  6:08 PM  Result Value Ref Range   Ammonia 25 11 - 32 umol/L    Comment: Please note change in reference range.  TSH     Status: None   Collection Time: 05/13/14  6:08 PM  Result Value Ref Range   TSH 0.813 0.350 - 4.500 uIU/mL  Acetaminophen level     Status: Abnormal   Collection Time: 05/13/14  6:10 PM  Result Value Ref Range   Acetaminophen (Tylenol), Serum <10.0 (L) 10 - 30 ug/mL    Comment:        THERAPEUTIC CONCENTRATIONS VARY SIGNIFICANTLY. A RANGE OF 10-30 ug/mL MAY BE AN EFFECTIVE CONCENTRATION FOR MANY PATIENTS. HOWEVER, SOME ARE BEST TREATED AT CONCENTRATIONS OUTSIDE THIS RANGE. ACETAMINOPHEN CONCENTRATIONS >150 ug/mL AT 4 HOURS AFTER INGESTION AND >50 ug/mL AT 12 HOURS AFTER INGESTION ARE OFTEN ASSOCIATED WITH TOXIC REACTIONS.   Ethanol      Status: None   Collection Time: 05/13/14  6:10 PM  Result Value Ref Range   Alcohol, Ethyl (B) <5 0 - 9 mg/dL    Comment:        LOWEST DETECTABLE LIMIT FOR SERUM ALCOHOL IS 11 mg/dL FOR MEDICAL PURPOSES ONLY   Lipase, blood     Status: None   Collection Time: 05/13/14  6:10 PM  Result Value Ref Range   Lipase 25 11 - 59 U/L  Salicylate level     Status: Abnormal   Collection Time: 05/13/14  6:10 PM  Result Value Ref Range   Salicylate Lvl 22.4 (H) 2.8 - 20.0 mg/dL  Valproic acid level     Status: None   Collection Time: 05/13/14  6:10 PM  Result Value Ref Range   Valproic Acid Lvl 56.3 50.0 - 100.0 ug/mL  Drug screen panel, emergency     Status: None   Collection Time: 05/13/14  6:59 PM  Result Value Ref Range   Opiates NONE DETECTED NONE DETECTED   Cocaine NONE DETECTED NONE DETECTED   Benzodiazepines NONE DETECTED  NONE DETECTED   Amphetamines NONE DETECTED NONE DETECTED   Tetrahydrocannabinol NONE DETECTED NONE DETECTED   Barbiturates NONE DETECTED NONE DETECTED    Comment:        DRUG SCREEN FOR MEDICAL PURPOSES ONLY.  IF CONFIRMATION IS NEEDED FOR ANY PURPOSE, NOTIFY LAB WITHIN 5 DAYS.        LOWEST DETECTABLE LIMITS FOR URINE DRUG SCREEN Drug Class       Cutoff (ng/mL) Amphetamine      1000 Barbiturate      200 Benzodiazepine   562 Tricyclics       563 Opiates          300 Cocaine          300 THC              50   I-Stat arterial blood gas, ED     Status: Abnormal   Collection Time: 05/13/14  7:49 PM  Result Value Ref Range   pH, Arterial 7.236 (L) 7.350 - 7.450   pCO2 arterial 45.2 (H) 35.0 - 45.0 mmHg   pO2, Arterial 67.0 (L) 80.0 - 100.0 mmHg   Bicarbonate 19.2 (L) 20.0 - 24.0 mEq/L   TCO2 21 0 - 100 mmol/L   O2 Saturation 89.0 %   Acid-base deficit 8.0 (H) 0.0 - 2.0 mmol/L   Patient temperature 98.6 F    Collection site RADIAL, ALLEN'S TEST ACCEPTABLE    Drawn by RT    Sample type ARTERIAL   MRSA PCR Screening     Status: None   Collection  Time: 05/13/14 11:29 PM  Result Value Ref Range   MRSA by PCR NEGATIVE NEGATIVE    Comment:        The GeneXpert MRSA Assay (FDA approved for NASAL specimens only), is one component of a comprehensive MRSA colonization surveillance program. It is not intended to diagnose MRSA infection nor to guide or monitor treatment for MRSA infections.   Glucose, capillary     Status: None   Collection Time: 05/14/14 12:03 AM  Result Value Ref Range   Glucose-Capillary 84 70 - 99 mg/dL  Salicylate level     Status: None   Collection Time: 05/14/14 12:08 AM  Result Value Ref Range   Salicylate Lvl 89.3 2.8 - 20.0 mg/dL  Glucose, capillary     Status: Abnormal   Collection Time: 05/14/14  5:23 AM  Result Value Ref Range   Glucose-Capillary 68 (L) 70 - 99 mg/dL  Glucose, capillary     Status: Abnormal   Collection Time: 05/14/14  6:40 AM  Result Value Ref Range   Glucose-Capillary 113 (H) 70 - 99 mg/dL  Salicylate level     Status: None   Collection Time: 05/14/14  7:00 AM  Result Value Ref Range   Salicylate Lvl 73.4 2.8 - 20.0 mg/dL  Comprehensive metabolic panel     Status: Abnormal   Collection Time: 05/14/14  7:00 AM  Result Value Ref Range   Sodium 144 135 - 145 mmol/L    Comment: Please note change in reference range.   Potassium 4.5 3.5 - 5.1 mmol/L    Comment: Please note change in reference range.   Chloride 117 (H) 96 - 112 mEq/L   CO2 16 (L) 19 - 32 mmol/L   Glucose, Bld 120 (H) 70 - 99 mg/dL   BUN 64 (H) 6 - 23 mg/dL   Creatinine, Ser 5.12 (H) 0.50 - 1.10 mg/dL   Calcium 9.0 8.4 - 10.5  mg/dL   Total Protein 6.0 6.0 - 8.3 g/dL   Albumin 2.8 (L) 3.5 - 5.2 g/dL   AST 11 0 - 37 U/L   ALT 9 0 - 35 U/L   Alkaline Phosphatase 53 39 - 117 U/L   Total Bilirubin 1.1 0.3 - 1.2 mg/dL   GFR calc non Af Amer 9 (L) >90 mL/min   GFR calc Af Amer 10 (L) >90 mL/min    Comment: (NOTE) The eGFR has been calculated using the CKD EPI equation. This calculation has not been validated  in all clinical situations. eGFR's persistently <90 mL/min signify possible Chronic Kidney Disease.    Anion gap 11 5 - 15  CBC with Differential     Status: Abnormal   Collection Time: 05/14/14  7:00 AM  Result Value Ref Range   WBC 4.4 4.0 - 10.5 K/uL   RBC 2.97 (L) 3.87 - 5.11 MIL/uL   Hemoglobin 9.2 (L) 12.0 - 15.0 g/dL   HCT 29.9 (L) 36.0 - 46.0 %   MCV 100.7 (H) 78.0 - 100.0 fL   MCH 31.0 26.0 - 34.0 pg   MCHC 30.8 30.0 - 36.0 g/dL   RDW 16.8 (H) 11.5 - 15.5 %   Platelets 146 (L) 150 - 400 K/uL   Neutrophils Relative % 71 43 - 77 %   Neutro Abs 3.2 1.7 - 7.7 K/uL   Lymphocytes Relative 15 12 - 46 %   Lymphs Abs 0.7 0.7 - 4.0 K/uL   Monocytes Relative 12 3 - 12 %   Monocytes Absolute 0.5 0.1 - 1.0 K/uL   Eosinophils Relative 2 0 - 5 %   Eosinophils Absolute 0.1 0.0 - 0.7 K/uL   Basophils Relative 0 0 - 1 %   Basophils Absolute 0.0 0.0 - 0.1 K/uL  Lipid panel     Status: Abnormal   Collection Time: 05/14/14  7:00 AM  Result Value Ref Range   Cholesterol 179 0 - 200 mg/dL   Triglycerides 190 (H) <150 mg/dL   HDL 53 >39 mg/dL   Total CHOL/HDL Ratio 3.4 RATIO   VLDL 38 0 - 40 mg/dL   LDL Cholesterol 88 0 - 99 mg/dL    Comment:        Total Cholesterol/HDL:CHD Risk Coronary Heart Disease Risk Table                     Men   Women  1/2 Average Risk   3.4   3.3  Average Risk       5.0   4.4  2 X Average Risk   9.6   7.1  3 X Average Risk  23.4   11.0        Use the calculated Patient Ratio above and the CHD Risk Table to determine the patient's CHD Risk.        ATP III CLASSIFICATION (LDL):  <100     mg/dL   Optimal  100-129  mg/dL   Near or Above                    Optimal  130-159  mg/dL   Borderline  160-189  mg/dL   High  >190     mg/dL   Very High   Glucose, capillary     Status: Abnormal   Collection Time: 05/14/14  7:48 AM  Result Value Ref Range   Glucose-Capillary 102 (H) 70 - 99 mg/dL  Urinalysis, Routine w reflex  microscopic     Status: Abnormal    Collection Time: 05/14/14  8:08 AM  Result Value Ref Range   Color, Urine YELLOW YELLOW   APPearance CLEAR CLEAR   Specific Gravity, Urine 1.013 1.005 - 1.030   pH 5.5 5.0 - 8.0   Glucose, UA NEGATIVE NEGATIVE mg/dL   Hgb urine dipstick NEGATIVE NEGATIVE   Bilirubin Urine NEGATIVE NEGATIVE   Ketones, ur NEGATIVE NEGATIVE mg/dL   Protein, ur >300 (A) NEGATIVE mg/dL   Urobilinogen, UA 0.2 0.0 - 1.0 mg/dL   Nitrite NEGATIVE NEGATIVE   Leukocytes, UA TRACE (A) NEGATIVE  Urine microscopic-add on     Status: Abnormal   Collection Time: 05/14/14  8:08 AM  Result Value Ref Range   Squamous Epithelial / LPF RARE RARE   WBC, UA 3-6 <3 WBC/hpf   Bacteria, UA MANY (A) RARE  Urine rapid drug screen (hosp performed)     Status: None   Collection Time: 05/14/14  8:09 AM  Result Value Ref Range   Opiates NONE DETECTED NONE DETECTED   Cocaine NONE DETECTED NONE DETECTED   Benzodiazepines NONE DETECTED NONE DETECTED   Amphetamines NONE DETECTED NONE DETECTED   Tetrahydrocannabinol NONE DETECTED NONE DETECTED   Barbiturates NONE DETECTED NONE DETECTED    Comment:        DRUG SCREEN FOR MEDICAL PURPOSES ONLY.  IF CONFIRMATION IS NEEDED FOR ANY PURPOSE, NOTIFY LAB WITHIN 5 DAYS.        LOWEST DETECTABLE LIMITS FOR URINE DRUG SCREEN Drug Class       Cutoff (ng/mL) Amphetamine      1000 Barbiturate      200 Benzodiazepine   384 Tricyclics       665 Opiates          300 Cocaine          300 THC              50   Blood gas, arterial     Status: Abnormal   Collection Time: 05/14/14  8:24 AM  Result Value Ref Range   O2 Content 1.5 L/min   Delivery systems NASAL CANNULA    pH, Arterial 7.217 (L) 7.350 - 7.450   pCO2 arterial 49.2 (H) 35.0 - 45.0 mmHg   pO2, Arterial 89.0 80.0 - 100.0 mmHg   Bicarbonate 19.3 (L) 20.0 - 24.0 mEq/L   TCO2 20.8 0 - 100 mmol/L   Acid-base deficit 7.2 (H) 0.0 - 2.0 mmol/L   O2 Saturation 96.3 %   Patient temperature 98.6    Collection site LEFT RADIAL     Drawn by [id number]    Sample type ARTERIAL DRAW    Allens test (pass/fail) PASS PASS    Ct Head (brain) Wo Contrast  05/13/2014   CLINICAL DATA:  Altered mental status, urinary retention  EXAM: CT HEAD WITHOUT CONTRAST  TECHNIQUE: Contiguous axial images were obtained from the base of the skull through the vertex without intravenous contrast.  COMPARISON:  01/15/2014 and brain MRI a 01/16/2014  FINDINGS: No skull fracture is noted. Again noted mild mucosal thickening posterior aspect of left maxillary sinus. The sphenoid sinuses and mastoid air cells are unremarkable. Frontal sinuses are unremarkable.  No intracranial hemorrhage, mass effect or midline shift. No acute cortical infarction. No mass lesion is noted on this unenhanced scan. The gray and white-matter differentiation is preserved.  IMPRESSION: No acute intracranial abnormality. Stable mild mucosal thickening posterior aspect of the left maxillary sinus. No definite acute  cortical infarction.   Electronically Signed   By: Lahoma Crocker M.D.   On: 05/13/2014 17:27   Mr Brain Wo Contrast  05/14/2014   CLINICAL DATA:  Increasing lethargy for 3 days , encephalopathy. Renal failure.  EXAM: MRI HEAD WITHOUT CONTRAST  TECHNIQUE: Multiplanar, multiecho pulse sequences of the brain and surrounding structures were obtained without intravenous contrast.  COMPARISON:  CT of the head May 13, 2014 and MRI of the head January 16, 2014  FINDINGS: Mild motion degraded examination.  The ventricles and sulci are normal. No abnormal parenchymal signal, mass lesions or mass of affect. No reduced diffusion to suggest acute ischemia. No susceptibility artifact to suggest hemorrhage. Mildly dolichoectatic intracranial vessels.  No abnormal extra-axial fluid collection. No abnormal calvarial bone marrow signal. Ocular globes and orbital contents are unremarkable though not tailored for evaluation. LEFT maxillary mucosal retention cyst without paranasal sinus air-fluid  levels. The mastoid air cells appear well-aerated. No abnormal sellar expansion. Craniocervical junction is maintained. The patient is edentulous.  IMPRESSION: No acute intracranial process, specifically no evidence of acute ischemia.  Mildly dolicoectatic intracranial vessels can be seen with chronic hypertension.   Electronically Signed   By: Elon Alas   On: 05/14/2014 05:19   Dg Chest Port 1 View  05/14/2014   CLINICAL DATA:  Fever, shortness of breath  EXAM: PORTABLE CHEST - 1 VIEW  COMPARISON:  04/12/2014  FINDINGS: Shallow inspiration. Cardiac enlargement with mild prominence of pulmonary vascularity suggesting mild vascular congestion. No focal airspace disease or edema. No blunting of costophrenic angles. No pneumothorax. Mediastinal contours appear intact.  IMPRESSION: Cardiac enlargement with mild pulmonary vascular congestion. No edema or consolidation.   Electronically Signed   By: Lucienne Capers M.D.   On: 05/14/2014 05:30    Review of Systems  Unable to perform ROS: intubated   Blood pressure 155/53, pulse 110, temperature 99.2 F (37.3 C), temperature source Axillary, resp. rate 18, height _0  (1.575 m), weight 92 kg (202 lb 13.2 oz), last menstrual period 05/09/2008, SpO2 98 %. Physical Exam  Nursing note and vitals reviewed. Constitutional: She appears well-developed and well-nourished.  HENT:  Head: Normocephalic.  Nose: Nose normal.  Endotracheal tube in situ-on the vent  Eyes: Pupils are equal, round, and reactive to light. No scleral icterus.  Neck: Normal range of motion. JVD present.  Cardiovascular: Regular rhythm.  Exam reveals no friction rub.   No murmur heard. Regular tachycardia  Respiratory:  Transmitted breath sounds while on ventilator  GI: Soft. Bowel sounds are normal. She exhibits no distension. There is no tenderness.  Musculoskeletal: Normal range of motion. She exhibits edema.  Neurological:  Sedated  Skin: Skin is warm and dry.     Assessment/Plan: 1. Progressive chronic kidney disease-now with evolution to end-stage renal disease: Plan to initiate dialysis today for management of multiple metabolic abnormalities and what appears to be uremic encephalopathy. 2. Metabolic encephalopathy: Suspected to be primarily from uremic encephalopathy and with progressive metabolic acidosis-question raised regarding possible salicylate toxicity even detectable levels and pattern of overuse. 3. Anion gap metabolic acidosis: Secondary to acute renal failure, managed with hemodialysis. 4. Anemia of chronic disease: Check iron stores and consider ESA therapy. 5. Metabolic bone disease: Check phosphorus levels/reconcile medications.  Jennifer Lawson K. 05/14/2014, 11:46 AM

## 2014-05-14 NOTE — Progress Notes (Addendum)
ANTIBIOTIC CONSULT NOTE - INITIAL  Pharmacy Consult for Cefepime, Vancomycin  Indication: HCAP  Allergies  Allergen Reactions  . Codeine Sulfate Anaphylaxis    Daughter called about having this allergy   . Haldol [Haloperidol Lactate] Shortness Of Breath  . Risperidone And Related Shortness Of Breath  . Gabapentin Other (See Comments)    seizures  . Invega [Paliperidone Er] Nausea And Vomiting  . Trazodone And Nefazodone Other (See Comments)    Makes pt loose balance and fall  . Vistaril [Hydroxyzine Hcl] Nausea And Vomiting    Patient Measurements: Height: 5\' 2"  (157.5 cm) Weight: 202 lb 13.2 oz (92 kg) IBW/kg (Calculated) : 50.1 Adjusted Body Weight: n/a   Vital Signs: Temp: 99.4 F (37.4 C) (01/06 0000) Temp Source: Axillary (01/06 0000) BP: 158/89 mmHg (01/05 2230) Pulse Rate: 109 (01/05 2230) Intake/Output from previous day: 01/05 0701 - 01/06 0700 In: -  Out: 500 [Urine:500] Intake/Output from this shift: Total I/O In: -  Out: 500 [Urine:500]  Labs:  Recent Labs  05/13/14 1722 05/13/14 1735  WBC 4.3  --   HGB 9.4* 10.9*  PLT 165  --   CREATININE 4.80* 4.40*   Estimated Creatinine Clearance: 15.4 mL/min (by C-G formula based on Cr of 4.4). No results for input(s): VANCOTROUGH, VANCOPEAK, VANCORANDOM, GENTTROUGH, GENTPEAK, GENTRANDOM, TOBRATROUGH, TOBRAPEAK, TOBRARND, AMIKACINPEAK, AMIKACINTROU, AMIKACIN in the last 72 hours.   Microbiology: Recent Results (from the past 720 hour(s))  MRSA PCR Screening     Status: None   Collection Time: 05/13/14 11:29 PM  Result Value Ref Range Status   MRSA by PCR NEGATIVE NEGATIVE Final    Comment:        The GeneXpert MRSA Assay (FDA approved for NASAL specimens only), is one component of a comprehensive MRSA colonization surveillance program. It is not intended to diagnose MRSA infection nor to guide or monitor treatment for MRSA infections.     Medical History: Past Medical History  Diagnosis Date   . Mental disorder   . Depression   . Hypertension   . Overdose   . Tobacco use disorder 11/13/2012  . Complication of anesthesia     difficulty going to sleep  . Chronic kidney disease     06/11/13- not on dialysis  . Shortness of breath     lying down flat  . PTSD (post-traumatic stress disorder)   . Asthma   . COPD (chronic obstructive pulmonary disease)   . Heart murmur   . GERD (gastroesophageal reflux disease)   . Seizures     "passsed out"  . History of blood transfusion   . Diabetes mellitus without complication     denies    Medications:  Prescriptions prior to admission  Medication Sig Dispense Refill Last Dose  . albuterol (PROVENTIL) (2.5 MG/3ML) 0.083% nebulizer solution Take 3 mLs (2.5 mg total) by nebulization every 4 (four) hours as needed for wheezing or shortness of breath. 75 mL 0 05/13/2014 at Unknown time  . Aspirin-Salicylamide-Caffeine (BC HEADACHE POWDER PO) Take 6 each by mouth daily as needed (back pain).   Past Week at Unknown time  . diphenhydrAMINE (SOMINEX) 25 MG tablet Take 25-50 mg by mouth at bedtime as needed for sleep.   Past Week at Unknown time  . divalproex (DEPAKOTE ER) 250 MG 24 hr tablet Take 3 tablets (750 mg total) by mouth at bedtime. For mood stabilization 90 tablet 0 05/12/2014 at Unknown time  . divalproex (DEPAKOTE) 500 MG DR tablet Take 500 mg  by mouth 2 (two) times daily.   05/13/2014 at Unknown time  . doxepin (SINEQUAN) 10 MG capsule Take 10 mg by mouth at bedtime as needed (insomnia).    05/12/2014 at Unknown time  . Fluticasone-Salmeterol (ADVAIR) 100-50 MCG/DOSE AEPB Inhale 1 puff into the lungs 2 (two) times daily. For asthma 60 each 0 Past Month at Unknown time  . furosemide (LASIX) 80 MG tablet Take 1 tablet (80 mg total) by mouth 2 (two) times daily. For swellings 60 tablet 0 05/12/2014 at Unknown time  . Menthol, Topical Analgesic, (ICY HOT EX) Apply 1 application topically daily as needed (back pain).   Past Week at Unknown time  .  metoprolol tartrate (LOPRESSOR) 25 MG tablet Take 1 tablet (25 mg total) by mouth 2 (two) times daily. For HTN 60 tablet 0 05/13/2014 at 0800  . azithromycin (ZITHROMAX Z-PAK) 250 MG tablet 2 po day one, then 1 daily x 4 days (Patient not taking: Reported on 05/13/2014) 6 tablet 0 Not Taking at Unknown time  . simvastatin (ZOCOR) 10 MG tablet Take 1 tablet (10 mg total) by mouth at bedtime. For high cholesterol/fats (Patient not taking: Reported on 05/13/2014) 30 tablet 0 Not Taking at Unknown time  . temazepam (RESTORIL) 7.5 MG capsule Take 1 capsule (7.5 mg total) by mouth at bedtime as needed for sleep. 30 capsule 0 Past Week at Unknown time  . ziprasidone (GEODON) 60 MG capsule Take 1 capsule (60 mg total) by mouth 2 (two) times daily with a meal. For mood control (Patient taking differently: Take 60 mg by mouth at bedtime. For mood control) 60 capsule 0 04/12/2014 at Unknown time   Assessment: 27 YOF with altered mental status. She has a history of CKD with fistula in place but not on dialysis. WBC is wnl. Pt is afebrile. CXR shows patchy right basilar and left midlung airspace opacities that raise concern for mild pneumonia. CrCl ~ 15 mL/min  Goal of Therapy:  Vancomycin trough level 15-20 mcg/ml  Plan:  -Start Cefepime 1 gm IV Q 24 hours -Start Vancomycin 1 gm IV Q 48 hours -Monitor CBC, renal fx, and s/s of clinical progress -VT at [place], PharmD., BCPS Clinical Pharmacist Pager [phone]

## 2014-05-14 NOTE — Progress Notes (Signed)
Subjective: Resting comfortably, remains lethargic. MRI brain completed, imaging reviewed and shows no acute infarct.    Objective: Current vital signs: BP 146/81 mmHg  Pulse 109  Temp(Src) 99.4 F (37.4 C) (Axillary)  Resp 18  Ht 5\' 2"  (1.575 m)  Wt 92 kg (202 lb 13.2 oz)  BMI 37.09 kg/m2  SpO2 95%  LMP 05/09/2008 Vital signs in last 24 hours: Temp:  [97.8 F (36.6 C)-99.4 F (37.4 C)] 99.4 F (37.4 C) (01/06 0000) Pulse Rate:  [100-111] 109 (01/06 0453) Resp:  [16-19] 18 (01/06 0453) BP: (121-158)/(64-92) 146/81 mmHg (01/06 0453) SpO2:  [92 %-98 %] 95 % (01/06 0453) Weight:  [92 kg (202 lb 13.2 oz)] 92 kg (202 lb 13.2 oz) (01/05 1700)  Intake/Output from previous day: 01/05 0701 - 01/06 0700 In: -  Out: 500 [Urine:500] Intake/Output this shift:   Nutritional status: Diet NPO time specified  Neurologic Exam: Mental Status: Lethargic, requires frequent stimulation to stay awake. Disoriented to name, location, date. Intermittently follows simple commands. Minimal verbal output. Moderate dysarthria noted.  Cranial Nerves: II: visual fields grossly normal, pupils equal, round, reactive to light III,IV, VI: ptosis not present, eyes midline, does not track but will moves eyes to right and left, no noted gaze preference V,VII: smile symmetric, facial light touch sensation normal bilaterally  Motor: Not following commands so unable to formally test but appears to move all extremities though moves L>R Sensory: Pinprick intact throughout but withdrawals more briskly on left Deep Tendon Reflexes: 1+ bilateral UE, absent patellar and achilles (marked edema) Plantars: Right: downgoingLeft: downgoing Lab Results: Basic Metabolic Panel:  Recent Labs Lab 05/13/14 1722 05/13/14 1735  NA 142 141  K 5.1 5.1  CL 110 114*  CO2 19  --   GLUCOSE 77 72  BUN 60* 58*  CREATININE 4.80* 4.40*  CALCIUM 9.0  --     Liver Function Tests:  Recent  Labs Lab 05/13/14 1722  AST 13  ALT 11  ALKPHOS 55  BILITOT 1.1  PROT 6.9  ALBUMIN 3.4*    Recent Labs Lab 05/13/14 1810  LIPASE 25    Recent Labs Lab 05/13/14 1808  AMMONIA 25    CBC:  Recent Labs Lab 05/13/14 1722 05/13/14 1735  WBC 4.3  --   NEUTROABS 2.9  --   HGB 9.4* 10.9*  HCT 30.7* 32.0*  MCV 97.2  --   PLT 165  --     Cardiac Enzymes: No results for input(s): CKTOTAL, CKMB, CKMBINDEX, TROPONINI in the last 168 hours.  Lipid Panel: No results for input(s): CHOL, TRIG, HDL, CHOLHDL, VLDL, LDLCALC in the last 168 hours.  CBG:  Recent Labs Lab 05/13/14 1700 05/13/14 1720 05/14/14 0003 05/14/14 0523  GLUCAP 73 74 84 68*    Microbiology: Results for orders placed or performed during the hospital encounter of 05/13/14  MRSA PCR Screening     Status: None   Collection Time: 05/13/14 11:29 PM  Result Value Ref Range Status   MRSA by PCR NEGATIVE NEGATIVE Final    Comment:        The GeneXpert MRSA Assay (FDA approved for NASAL specimens only), is one component of a comprehensive MRSA colonization surveillance program. It is not intended to diagnose MRSA infection nor to guide or monitor treatment for MRSA infections.     Coagulation Studies:  Recent Labs  05/13/14 1722  LABPROT 14.8  INR 1.15    Imaging: Ct Head (brain) Wo Contrast  05/13/2014   CLINICAL  DATA:  Altered mental status, urinary retention  EXAM: CT HEAD WITHOUT CONTRAST  TECHNIQUE: Contiguous axial images were obtained from the base of the skull through the vertex without intravenous contrast.  COMPARISON:  01/15/2014 and brain MRI a 01/16/2014  FINDINGS: No skull fracture is noted. Again noted mild mucosal thickening posterior aspect of left maxillary sinus. The sphenoid sinuses and mastoid air cells are unremarkable. Frontal sinuses are unremarkable.  No intracranial hemorrhage, mass effect or midline shift. No acute cortical infarction. No mass lesion is noted on this  unenhanced scan. The gray and white-matter differentiation is preserved.  IMPRESSION: No acute intracranial abnormality. Stable mild mucosal thickening posterior aspect of the left maxillary sinus. No definite acute cortical infarction.   Electronically Signed   By: Lahoma Crocker M.D.   On: 05/13/2014 17:27   Mr Brain Wo Contrast  05/14/2014   CLINICAL DATA:  Increasing lethargy for 3 days , encephalopathy. Renal failure.  EXAM: MRI HEAD WITHOUT CONTRAST  TECHNIQUE: Multiplanar, multiecho pulse sequences of the brain and surrounding structures were obtained without intravenous contrast.  COMPARISON:  CT of the head May 13, 2014 and MRI of the head January 16, 2014  FINDINGS: Mild motion degraded examination.  The ventricles and sulci are normal. No abnormal parenchymal signal, mass lesions or mass of affect. No reduced diffusion to suggest acute ischemia. No susceptibility artifact to suggest hemorrhage. Mildly dolichoectatic intracranial vessels.  No abnormal extra-axial fluid collection. No abnormal calvarial bone marrow signal. Ocular globes and orbital contents are unremarkable though not tailored for evaluation. LEFT maxillary mucosal retention cyst without paranasal sinus air-fluid levels. The mastoid air cells appear well-aerated. No abnormal sellar expansion. Craniocervical junction is maintained. The patient is edentulous.  IMPRESSION: No acute intracranial process, specifically no evidence of acute ischemia.  Mildly dolicoectatic intracranial vessels can be seen with chronic hypertension.   Electronically Signed   By: Elon Alas   On: 05/14/2014 05:19   Dg Chest Port 1 View  05/14/2014   CLINICAL DATA:  Fever, shortness of breath  EXAM: PORTABLE CHEST - 1 VIEW  COMPARISON:  04/12/2014  FINDINGS: Shallow inspiration. Cardiac enlargement with mild prominence of pulmonary vascularity suggesting mild vascular congestion. No focal airspace disease or edema. No blunting of costophrenic angles. No  pneumothorax. Mediastinal contours appear intact.  IMPRESSION: Cardiac enlargement with mild pulmonary vascular congestion. No edema or consolidation.   Electronically Signed   By: Lucienne Capers M.D.   On: 05/14/2014 05:30    Medications:  Scheduled: . antiseptic oral rinse  7 mL Mouth Rinse BID  . ceFEPime (MAXIPIME) IV  1 g Intravenous Q24H  . heparin  5,000 Units Subcutaneous 3 times per day  . mometasone-formoterol  2 puff Inhalation BID  . sodium chloride  3 mL Intravenous Q12H  . thiamine IV  100 mg Intravenous Daily  . vancomycin  1,000 mg Intravenous Q48H    Assessment/Plan: 54y/o women with history of renal failure, bipolar disorder presenting for evaluation of altered mental status. Symptoms have been getting progressively worse for past 2-3 days. Suspect symptoms are likely multifactorial with polypharmacy playing a role. Labs pertinent for mildy elevated salicylate level and ABG showing a mixed metabolic acidosis/alkalosis picture. MRI brain shows no acute infarct. Mild fever with no leukocytosis noted.   -repeat ABG -blood and urine cultures pending -hold sedating medications -hold on EEG at this point -will continue to follow   LOS: 1 day   Jim Like, DO [facility] [phone]  If 7pm-  7am, please page neurology on call as listed in [place]. 05/14/2014  7:28 AM

## 2014-05-14 NOTE — Procedures (Signed)
Intubation Procedure Note TRYSTA SHOWMAN [id number] [birth year]  Procedure: Intubation Indications: Respiratory insufficiency  Procedure Details Consent: Unable to obtain consent because of altered level of consciousness. Time Out: Verified patient identification, verified procedure, site/side was marked, verified correct patient position, special equipment/implants available, medications/allergies/relevent history reviewed, required imaging and test results available.  Performed  Maximum sterile technique was used including gloves and hand hygiene.  MAC and 3    Evaluation Hemodynamic Status: Transient hypotension resolved spontaneously; O2 sats: stable throughout Patient's Current Condition: stable Complications: No apparent complications Patient did tolerate procedure well. Chest X-ray ordered to verify placement.  CXR: tube position acceptable.   Chesley Mires, MD [facility] Pulmonary/Critical Care 05/14/2014, 1:15 PM Pager:  [phone] After 3pm call: [phone]

## 2014-05-14 NOTE — Procedures (Signed)
Patient seen on Hemodialysis. QB 200, UF goal 2L Treatment adjusted as needed.  Elmarie Shiley MD [facility]. Office # [phone] Pager # [phone] 3:32 PM

## 2014-05-14 NOTE — Consult Note (Signed)
PULMONARY / CRITICAL CARE MEDICINE   Name: Jennifer Lawson MRN: [id number] DOB: 02/04/1961    ADMISSION DATE:  05/13/2014 CONSULTATION DATE:  05/14/14  REFERRING MD :  Dr. 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  PCCM consulted for increasing lethargy, electrolyte abnormalities, acidosis.     STUDIES:  1/5  CT Head >> no acute abnormality 1/6  MRI Head >> no acute process  1/6 EEG >>>  SIGNIFICANT EVENTS:   HISTORY OF PRESENT ILLNESS:  54yo female heavy smoker with extensive psych hx, CKD V, HTN, DM, COPD.  Takes multiple psych meds including geodon, and has been taking increasing doses of NSAIDs over the last 7-10 days.  Per daughter she has had increased confusion, paranoia and progressive lethargy.  Pt does also drink large amounts of ETOH when it is available.  She was found slumped over the commode on 1/5.  Daughter also endorses increased swelling and new LLE large hematoma. Denies fever, chills, SOB, chest pain.     PAST MEDICAL HISTORY :   has a past medical history of Mental disorder; Depression; Hypertension; Overdose; Tobacco use disorder (11/13/2012); Complication of anesthesia; Chronic kidney disease; Shortness of breath; PTSD (post-traumatic stress disorder); Asthma; COPD (chronic obstructive pulmonary disease); Heart murmur; GERD (gastroesophageal reflux disease); Seizures; History of blood transfusion; and Diabetes mellitus without complication.  has past surgical history that includes Right knee replacement; Esophagogastroduodenoscopy (Left, 11/14/2012); Joint replacement (Right); Parathyroidectomy; and AV fistula placement (Right, 06/12/2013).   HOME MEDICATIONS:  Prior to Admission medications   Medication Sig Start Date End Date Taking? Authorizing Provider  albuterol (PROVENTIL) (2.5 MG/3ML) 0.083% nebulizer solution Take  3 mLs (2.5 mg total) by nebulization every 4 (four) hours as needed for wheezing or shortness of breath. 04/03/14  Yes Encarnacion Slates, NP  Aspirin-Salicylamide-Caffeine (BC HEADACHE POWDER PO) Take 6 each by mouth daily as needed (back pain).   Yes Historical Provider, MD  diphenhydrAMINE (SOMINEX) 25 MG tablet Take 25-50 mg by mouth at bedtime as needed for sleep.   Yes Historical Provider, MD  divalproex (DEPAKOTE ER) 250 MG 24 hr tablet Take 3 tablets (750 mg total) by mouth at bedtime. For mood stabilization 04/03/14  Yes Encarnacion Slates, NP  divalproex (DEPAKOTE) 500 MG DR tablet Take 500 mg by mouth 2 (two) times daily.   Yes Historical Provider, MD  doxepin (SINEQUAN) 10 MG capsule Take 10 mg by mouth at bedtime as needed (insomnia).    Yes Historical Provider, MD  Fluticasone-Salmeterol (ADVAIR) 100-50 MCG/DOSE AEPB Inhale 1 puff into the lungs 2 (two) times daily. For asthma 04/03/14  Yes Encarnacion Slates, NP  furosemide (LASIX) 80 MG tablet Take 1 tablet (80 mg total) by mouth 2 (two) times daily. For swellings 04/03/14  Yes Encarnacion Slates, NP  Menthol, Topical Analgesic, (ICY HOT EX) Apply 1 application topically daily as needed (back pain).   Yes Historical Provider, MD  metoprolol tartrate (LOPRESSOR) 25 MG tablet Take 1 tablet (25 mg total) by mouth 2 (two) times daily. For HTN 04/03/14  Yes Encarnacion Slates, NP  azithromycin (ZITHROMAX Z-PAK) 250 MG tablet 2 po day one, then 1 daily x 4 days Patient not taking: Reported on 05/13/2014 04/13/14   Karen Chafe Molpus, MD  simvastatin (ZOCOR) 10 MG tablet Take 1 tablet (10 mg total) by mouth at  bedtime. For high cholesterol/fats Patient not taking: Reported on 05/13/2014 04/03/14   Encarnacion Slates, NP  temazepam (RESTORIL) 7.5 MG capsule Take 1 capsule (7.5 mg total) by mouth at bedtime as needed for sleep. 04/03/14   Encarnacion Slates, NP  ziprasidone (GEODON) 60 MG capsule Take 1 capsule (60 mg total) by mouth 2 (two) times daily with a meal. For mood  control Patient taking differently: Take 60 mg by mouth at bedtime. For mood control 04/03/14   Encarnacion Slates, NP   Allergies  Allergen Reactions  . Codeine Sulfate Anaphylaxis    Daughter called about having this allergy   . Haldol [Haloperidol Lactate] Shortness Of Breath  . Risperidone And Related Shortness Of Breath  . Gabapentin Other (See Comments)    seizures  . Invega [Paliperidone Er] Nausea And Vomiting  . Trazodone And Nefazodone Other (See Comments)    Makes pt loose balance and fall  . Vistaril [Hydroxyzine Hcl] Nausea And Vomiting    FAMILY HISTORY:  indicated that her mother is alive. She indicated that her father is deceased.    SOCIAL HISTORY:  reports that she has been smoking Cigarettes and Cigars.  She has a 80 pack-year smoking history. She has never used smokeless tobacco. She reports that she does not drink alcohol or use illicit drugs.  REVIEW OF SYSTEMS:  Unable to complete as patient is altered.  Information obtained from family at bedside and prior medical documentation.   SUBJECTIVE:   VITAL SIGNS: Temp:  [97.8 F (36.6 C)-99.4 F (37.4 C)] 99.2 F (37.3 C) (01/06 0752) Pulse Rate:  [100-111] 105 (01/06 0752) Resp:  [16-19] 17 (01/06 0752) BP: (121-163)/(59-92) 163/59 mmHg (01/06 0752) SpO2:  [92 %-98 %] 96 % (01/06 0752) Weight:  [202 lb 13.2 oz (92 kg)] 202 lb 13.2 oz (92 kg) (01/05 1700) HEMODYNAMICS:   VENTILATOR SETTINGS:   INTAKE / OUTPUT:  Intake/Output Summary (Last 24 hours) at 05/14/14 1004 Last data filed at 05/13/14 2235  Gross per 24 hour  Intake      0 ml  Output    500 ml  Net   -500 ml    PHYSICAL EXAMINATION: General:  Chronically ill appearing female, NAD  Neuro:  Lethargic, arouses to pain, opens eyes, mumbles, coughs  HEENT:  Mm dry, no JVD  Cardiovascular:  s1s2 rrr, mild tachy  Lungs:  resps even, non labored, sats 100% on 2L, few scattered rhonchi, strong cough when awake Abdomen:  Soft, non tender, non  distended, hypoactive bs  Musculoskeletal:  Warm and dry, generalized edema, 2+ BLE edema, LLE posterior large hematoma   LABS:  CBC  Recent Labs Lab 05/13/14 1722 05/13/14 1735 05/14/14 0700  WBC 4.3  --  4.4  HGB 9.4* 10.9* 9.2*  HCT 30.7* 32.0* 29.9*  PLT 165  --  146*   Coag's  Recent Labs Lab 05/13/14 1722  APTT 38*  INR 1.15   BMET  Recent Labs Lab 05/13/14 1722 05/13/14 1735 05/14/14 0700  NA 142 141 144  K 5.1 5.1 4.5  CL 110 114* 117*  CO2 19  --  16*  BUN 60* 58* 64*  CREATININE 4.80* 4.40* 5.12*  GLUCOSE 77 72 120*   Electrolytes  Recent Labs Lab 05/13/14 1722 05/14/14 0700  CALCIUM 9.0 9.0   Sepsis Markers No results for input(s): LATICACIDVEN, PROCALCITON, O2SATVEN in the last 168 hours. ABG  Recent Labs Lab 05/13/14 1949 05/14/14 0824  PHART 7.236* 7.217*  PCO2ART  45.2* 49.2*  PO2ART 67.0* 89.0   Liver Enzymes  Recent Labs Lab 05/13/14 1722 05/14/14 0700  AST 13 11  ALT 11 9  ALKPHOS 55 53  BILITOT 1.1 1.1  ALBUMIN 3.4* 2.8*   Cardiac Enzymes No results for input(s): TROPONINI, PROBNP in the last 168 hours. Glucose  Recent Labs Lab 05/13/14 1700 05/13/14 1720 05/14/14 0003 05/14/14 0523 05/14/14 0640 05/14/14 0748  GLUCAP 73 74 84 68* 113* 102*    Imaging Ct Head (brain) Wo Contrast  05/13/2014   CLINICAL DATA:  Altered mental status, urinary retention  EXAM: CT HEAD WITHOUT CONTRAST  TECHNIQUE: Contiguous axial images were obtained from the base of the skull through the vertex without intravenous contrast.  COMPARISON:  01/15/2014 and brain MRI a 01/16/2014  FINDINGS: No skull fracture is noted. Again noted mild mucosal thickening posterior aspect of left maxillary sinus. The sphenoid sinuses and mastoid air cells are unremarkable. Frontal sinuses are unremarkable.  No intracranial hemorrhage, mass effect or midline shift. No acute cortical infarction. No mass lesion is noted on this unenhanced scan. The gray and  white-matter differentiation is preserved.  IMPRESSION: No acute intracranial abnormality. Stable mild mucosal thickening posterior aspect of the left maxillary sinus. No definite acute cortical infarction.   Electronically Signed   By: Lahoma Crocker M.D.   On: 05/13/2014 17:27     ASSESSMENT / PLAN:  PULMONARY A: Tobacco Abuse COPD -  Mixed Acidosis  P:   Pulmonary hygiene as able  Smoking cessation  F/u CXR  BD's  Low threshold for bipap v intubation - protecting airway for now, hypercarbia mild  Tx ICU    CARDIOVASCULAR Hx Hypertension  Volume overload  P:  PRN labetalol  Consider HD per renal   Hold home lasix    RENAL A:   Acute on CKD V - Baseline Scr ~4.0.  RUE fistula placed 12/8414 Metabolic Acidosis - likely combination of uremia and hyperchloremia worsened by psych meds  Hyperchloremia  P:   D5W  Renal to see  Consider HD  Cont HCO3 gtt for now  F/u ABG    GASTROINTESTINAL A:   Severe Protein Calorie Malnutrition ETOH  P:   NPO  Thiamine/folate  Will need swallow eval once mental status allows PO's    HEMATOLOGIC A:   Macrocytic Anemia  L Calf Hematoma - no known trauma P:  BLE dopplers    INFECTIOUS ?UTI  P:   BCx2 1/6 >>  UC 1/6 >>   Vancomycin 1/6>>>1/6 Cefepime 1/6>>>1/6 Ceftriaxone 1/6>>>  No evidence HCAP on CXR, no leukocytosis  Check pct  D/c vanc [place] abx to cover ?UTI   ENDOCRINE A:   Hyperglycemia   P:   SSI  Check TSH    NEUROLOGIC A:   Acute Encephalopathy - unclear etiology.  Depakote level ok, CT, MRI head ok.  Bipolar Depression - with hx of overdose in past PTSD P:   EEG pending  Consider HD as above  Hold home psych rx  Monitor resp status  Cont depakote for now - ?hold.  Unclear if this is for psych v seizures at baseline.    Acute metabolic encephalopathy, mixed acidosis and acute on CKD V, likely multifactorial r/t NSAID's, ETOH, psych meds.  Renal to see, likely needs HD.  Tx ICU, monitor  resp status closely, hope mental status will improve with HD.  High risk for intubation.   FAMILY  - Updates: daughter updated at length at bedside   -  Inter-disciplinary family meet or Palliative Care meeting due by:  1/13   Nickolas Madrid, NP 05/14/2014  10:04 AM Pager: [phone] or [phone]  Reviewed above, examined pt.  54 yo female smoker admitted with altered mental status.  She has persistent acidosis.  She hx of ESRD.    She is minimally arousable.  Minimal gag, gurgling respirations.  Poor air movement.  Will move to ICU, intubate.  Renal consulted >> will likely need HD.  Continue Abx for ? UTI.  CC time by me independent of APP time 40 minutes.  Chesley Mires, MD [facility] Pulmonary/Critical Care 05/14/2014, 10:53 AM Pager:  [phone] After 3pm call: [phone]

## 2014-05-14 NOTE — Procedures (Signed)
History: 54 yo F with altered mental status  Sedation: None  Technique: This is a 17 channel routine scalp EEG performed at the bedside with bipolar and monopolar montages arranged in accordance to the international 10/20 system of electrode placement. One channel was dedicated to EKG recording.    Background: The background is dominated by high voltage generalized irregular theta and delta activity. There is a  posterior dominant rhythm of 8.5 Hz superimposed on the underlyign slow activity.   Photic stimulation: Physiologic driving is not performed.   EEG Abnormalities: 1) Generalized irregular slow activity.   Clinical Interpretation: This EEG is consistent with a mild - moderate generalized non-specific cerebral dysfunction(encephalopathy). There was no seizure or seizure predisposition recorded on this study.   Roland Rack, MD [facility] [phone]  If 7pm- 7am, please page neurology on call as listed in [place].

## 2014-05-14 NOTE — Progress Notes (Signed)
*  Preliminary Results* Left lower extremity venous duplex completed. Visualized veins of the left lower extremity are negative for deep vein thrombosis. There is no evidence of left Baker's cyst. There is a heterogenous area of the proximal posteromedial left calf measuring 1.9cm, suggestive of possible hematoma versus unknown etiology.   05/14/2014 11:06 AM  Maudry Mayhew, RVT, RDCS, RDMS

## 2014-05-14 NOTE — Progress Notes (Addendum)
[facility] PROGRESS NOTE  Jennifer Lawson QBH:[id number] DOB: 02-Jan-1961 DOA: 05/13/2014 PCP: [facility]  Assessment/Plan: 1-Acute Encephalopathy; Patient continue to be lethargic. Not following command.  Repeat ABG, CBG.  MRI negative for stroke.  Continue to hold Geodon.  Covering for infection process. Continue with vancomycin and cefepime.  Follow blood culture, urine culture.  Valproic acid at 56. Will discuss with neuro if we need to hold or resume Depakote.  UDS negative. Salicylate level 09---73--- CCM consulted.   Metabolic acidosis. Start bicarb Gtt.   Chronic diseases stage V; continue with IV fluids.  Cr baseline 4.1 to 3.6. Insert foley catheter.  Strict i and o.  Renal consulted.   Bipolar; holding Geodon due to AMS.   Anemia of chronic diseases; follow trend.   Asthma;Albuterol PRN.   Left cal hematoma: check dopplers.   HTN; Labetalol PRN.   DVT prophylaxis; heparin.   Code Status: Full Code.  Family Communication: none at bedside.  Disposition Plan: might need to be transfer to ICU.    Consultants:  Neurology  CCM  Procedures:  none  Antibiotics:  Cefepime 1-06  Vancomycin 1-06  HPI/Subjective: Lethargic, mumbling.  Does not follows command.    Objective: Filed Vitals:   05/14/14 0453  BP: 146/81  Pulse: 109  Temp:   Resp: 18    Intake/Output Summary (Last 24 hours) at 05/14/14 0805 Last data filed at 05/13/14 2235  Gross per 24 hour  Intake      0 ml  Output    500 ml  Net   -500 ml   Filed Weights   05/13/14 1700  Weight: 92 kg (202 lb 13.2 oz)    Exam:   General:  Lethargic, open eyes for seconds  Cardiovascular: S 1, S 2 RRR  Respiratory: Bilateral ronchus.   Abdomen: BS present, soft, NT  Musculoskeletal: left calf with hematoma.   Data Reviewed: Basic Metabolic Panel:  Recent Labs Lab 05/13/14 1722 05/13/14 1735  NA 142 141  K 5.1 5.1  CL 110 114*  CO2 19  --   GLUCOSE 77  72  BUN 60* 58*  CREATININE 4.80* 4.40*  CALCIUM 9.0  --    Liver Function Tests:  Recent Labs Lab 05/13/14 1722  AST 13  ALT 11  ALKPHOS 55  BILITOT 1.1  PROT 6.9  ALBUMIN 3.4*    Recent Labs Lab 05/13/14 1810  LIPASE 25    Recent Labs Lab 05/13/14 1808  AMMONIA 25   CBC:  Recent Labs Lab 05/13/14 1722 05/13/14 1735 05/14/14 0700  WBC 4.3  --  4.4  NEUTROABS 2.9  --  3.2  HGB 9.4* 10.9* 9.2*  HCT 30.7* 32.0* 29.9*  MCV 97.2  --  100.7*  PLT 165  --  146*   Cardiac Enzymes: No results for input(s): CKTOTAL, CKMB, CKMBINDEX, TROPONINI in the last 168 hours. BNP (last 3 results) No results for input(s): PROBNP in the last 8760 hours. CBG:  Recent Labs Lab 05/13/14 1700 05/13/14 1720 05/14/14 0003 05/14/14 0523  GLUCAP 73 74 84 68*    Recent Results (from the past 240 hour(s))  MRSA PCR Screening     Status: None   Collection Time: 05/13/14 11:29 PM  Result Value Ref Range Status   MRSA by PCR NEGATIVE NEGATIVE Final    Comment:        The GeneXpert MRSA Assay (FDA approved for NASAL specimens only), is one component of a comprehensive MRSA colonization surveillance  program. It is not intended to diagnose MRSA infection nor to guide or monitor treatment for MRSA infections.      Studies: Ct Head (brain) Wo Contrast  05/13/2014   CLINICAL DATA:  Altered mental status, urinary retention  EXAM: CT HEAD WITHOUT CONTRAST  TECHNIQUE: Contiguous axial images were obtained from the base of the skull through the vertex without intravenous contrast.  COMPARISON:  01/15/2014 and brain MRI a 01/16/2014  FINDINGS: No skull fracture is noted. Again noted mild mucosal thickening posterior aspect of left maxillary sinus. The sphenoid sinuses and mastoid air cells are unremarkable. Frontal sinuses are unremarkable.  No intracranial hemorrhage, mass effect or midline shift. No acute cortical infarction. No mass lesion is noted on this unenhanced scan. The gray  and white-matter differentiation is preserved.  IMPRESSION: No acute intracranial abnormality. Stable mild mucosal thickening posterior aspect of the left maxillary sinus. No definite acute cortical infarction.   Electronically Signed   By: Lahoma Crocker M.D.   On: 05/13/2014 17:27   Mr Brain Wo Contrast  05/14/2014   CLINICAL DATA:  Increasing lethargy for 3 days , encephalopathy. Renal failure.  EXAM: MRI HEAD WITHOUT CONTRAST  TECHNIQUE: Multiplanar, multiecho pulse sequences of the brain and surrounding structures were obtained without intravenous contrast.  COMPARISON:  CT of the head May 13, 2014 and MRI of the head January 16, 2014  FINDINGS: Mild motion degraded examination.  The ventricles and sulci are normal. No abnormal parenchymal signal, mass lesions or mass of affect. No reduced diffusion to suggest acute ischemia. No susceptibility artifact to suggest hemorrhage. Mildly dolichoectatic intracranial vessels.  No abnormal extra-axial fluid collection. No abnormal calvarial bone marrow signal. Ocular globes and orbital contents are unremarkable though not tailored for evaluation. LEFT maxillary mucosal retention cyst without paranasal sinus air-fluid levels. The mastoid air cells appear well-aerated. No abnormal sellar expansion. Craniocervical junction is maintained. The patient is edentulous.  IMPRESSION: No acute intracranial process, specifically no evidence of acute ischemia.  Mildly dolicoectatic intracranial vessels can be seen with chronic hypertension.   Electronically Signed   By: Elon Alas   On: 05/14/2014 05:19   Dg Chest Port 1 View  05/14/2014   CLINICAL DATA:  Fever, shortness of breath  EXAM: PORTABLE CHEST - 1 VIEW  COMPARISON:  04/12/2014  FINDINGS: Shallow inspiration. Cardiac enlargement with mild prominence of pulmonary vascularity suggesting mild vascular congestion. No focal airspace disease or edema. No blunting of costophrenic angles. No pneumothorax. Mediastinal  contours appear intact.  IMPRESSION: Cardiac enlargement with mild pulmonary vascular congestion. No edema or consolidation.   Electronically Signed   By: Lucienne Capers M.D.   On: 05/14/2014 05:30    Scheduled Meds: . antiseptic oral rinse  7 mL Mouth Rinse BID  . ceFEPime (MAXIPIME) IV  1 g Intravenous Q24H  . heparin  5,000 Units Subcutaneous 3 times per day  . mometasone-formoterol  2 puff Inhalation BID  . sodium chloride  3 mL Intravenous Q12H  . thiamine IV  100 mg Intravenous Daily  . vancomycin  1,000 mg Intravenous Q48H   Continuous Infusions: . dextrose 40 mL/hr at 05/14/14 0350    Principal Problem:   Acute encephalopathy Active Problems:   Bipolar 1 disorder   CKD (chronic kidney disease) stage 5, GFR less than 15 ml/min   Mixed acid base balance disorder   Salicylate overdose    Time spent: 35 minutes.     Niel Hummer A  [facility] Pager [phone]. If  7PM-7AM, please contact night-coverage at [email or web address], password [facility] 05/14/2014, 8:05 AM  LOS: 1 day

## 2014-05-14 NOTE — Progress Notes (Signed)
Report given to Clarise Cruz RN at this time.  Pt assessment remains unchanged.  GCS 10-11.  Pt responsive to voice or pain.  Pupils reactive.

## 2014-05-14 NOTE — Progress Notes (Signed)
Pt arrived to unit from ED. Pt only responding when name is called.  Speech is unclear and mumbles when speaking. Pt does not follow commands but localizes pain. Upon sternal rub, patient clearly states 'that hurts' and falls back to sleep.  Pt is in room sleeping and snoring at this time. VSS. Neuro ICU RN notified to come perform NIH stroke screen. Dr. Hal Hope aware of neuro status. Will continue to perform q2h neuro assessments.

## 2014-05-14 NOTE — Procedures (Signed)
Central Venous Catheter Insertion Procedure Note Jennifer Lawson [id number] October 09, 1960  Procedure: Insertion of Central Venous Catheter Indications: Assessment of intravascular volume and Drug and/or fluid administration  Procedure Details Consent: Unable to obtain consent because of emergent medical necessity. Time Out: Verified patient identification, verified procedure, site/side was marked, verified correct patient position, special equipment/implants available, medications/allergies/relevent history reviewed, required imaging and test results available.  Performed  Maximum sterile technique was used including antiseptics, cap, gloves, gown, hand hygiene, mask and sheet. Skin prep: Chlorhexidine; local anesthetic administered A antimicrobial bonded/coated triple lumen catheter was placed in the left internal jugular vein using the Seldinger technique.  Evaluation Blood flow good Complications: No apparent complications Patient did tolerate procedure well. Chest X-ray ordered to verify placement.  CXR: pending.   Performed under direct MD supervision.  Performed using ultrasound guidance.  Wire visualized in vessel under ultrasound.   Nickolas Madrid, NP 05/14/2014  12:19 PM    I was present for procedure.  Chesley Mires, MD [facility] Pulmonary/Critical Care 05/14/2014, 1:15 PM Pager:  [phone] After 3pm call: [phone]

## 2014-05-14 NOTE — Progress Notes (Addendum)
INITIAL NUTRITION ASSESSMENT  DOCUMENTATION CODES Per approved criteria  -Obesity Unspecified   INTERVENTION: If pt remains lethargic and unable to pass swallow evaluation consider initiating Nepro with Carb Steady @ 35 ml/hr via nasogastric feeding tube  30 ml Prostat BID.    Tube feeding regimen provides 1712 kcal (>100% of needs), 98 grams of protein, and 610 ml of H2O.   If diet able to be advanced will monitor PO's and supplement as needed.   NUTRITION DIAGNOSIS: Inadequate oral intake related to inability to eat as evidenced by NPO status  Goal: Pt to meet >/= 90% of their estimated nutrition needs   Monitor:  Diet advancement, PO intake, labs  Reason for Assessment: Pt identified as at nutrition risk on the Malnutrition Screen Tool/Low Braden  54 y.o. female  Admitting Dx: Acute encephalopathy  ASSESSMENT: Pt with hx of heavy smoking, Bipolar 1 (on multiple medications) and CKD stage V (fistula placed 06/2013) admitted with acute encephalopathy with mixed acid base balance disorder, cause not clear. Per hx pt drinks large amounts of ETOH when available. Per daughter pt with increased confusion, paranoia, and lethargy PTA.   Holding some medications due to AMS. Nephrology consult pending.  SLP eval this am and recommends NPO.   BUN/Cr elevated, Cr normally 4 Spoke with RN at bedside. Pt being transferred to 24M. Pt remains lethargic, unable to provide any nutrition hx. Weight by hx is variable. Pt does have non-pitting edema to bilateral LE.  No signs of fat or muscle depletion on exam.    Height: Ht Readings from Last 1 Encounters:  05/13/14 5\' 2"  (1.575 m)    Weight: Wt Readings from Last 1 Encounters:  05/13/14 202 lb 13.2 oz (92 kg)    Ideal Body Weight: 50 kg   % Ideal Body Weight: 184%  Wt Readings from Last 10 Encounters:  05/13/14 202 lb 13.2 oz (92 kg)  04/12/14 203 lb (92.08 kg)  04/01/14 186 lb (84.369 kg)  03/12/14 184 lb (83.462 kg)   02/05/14 184 lb (83.462 kg)  01/20/14 192 lb 7.4 oz (87.3 kg)  09/24/13 195 lb (88.451 kg)  06/30/13 225 lb 8.5 oz (102.3 kg)  06/11/13 213 lb (96.616 kg)  04/26/13 210 lb 5.1 oz (95.4 kg)    Usual Body Weight: 184-203 lb   % Usual Body Weight: 100%  BMI:  Body mass index is 37.09 kg/(m^2).  Estimated Nutritional Needs: Kcal: 1600-1800 Protein: 80-95 grams Fluid: > 1.2 L/day  Skin: ecchymosis  Diet Order: Diet NPO time specified  EDUCATION NEEDS: -No education needs identified at this time   Intake/Output Summary (Last 24 hours) at 05/14/14 0909 Last data filed at 05/13/14 2235  Gross per 24 hour  Intake      0 ml  Output    500 ml  Net   -500 ml    Last BM: 1/6   Labs:   Recent Labs Lab 05/13/14 1722 05/13/14 1735 05/14/14 0700  NA 142 141 144  K 5.1 5.1 4.5  CL 110 114* 117*  CO2 19  --  16*  BUN 60* 58* 64*  CREATININE 4.80* 4.40* 5.12*  CALCIUM 9.0  --  9.0  GLUCOSE 77 72 120*    CBG (last 3)   Recent Labs  05/14/14 0523 05/14/14 0640 05/14/14 0748  GLUCAP 68* 113* 102*    Scheduled Meds: . antiseptic oral rinse  7 mL Mouth Rinse BID  . ceFEPime (MAXIPIME) IV  1 g Intravenous Q24H  .  heparin  5,000 Units Subcutaneous 3 times per day  . mometasone-formoterol  2 puff Inhalation BID  . sodium chloride  3 mL Intravenous Q12H  . thiamine IV  100 mg Intravenous Daily  . valproate sodium  500 mg Intravenous Q12H  . vancomycin  1,000 mg Intravenous Q48H    Continuous Infusions: . dextrose 40 mL/hr at 05/14/14 0558  .  sodium bicarbonate  infusion 1000 mL      Past Medical History  Diagnosis Date  . Mental disorder   . Depression   . Hypertension   . Overdose   . Tobacco use disorder 11/13/2012  . Complication of anesthesia     difficulty going to sleep  . Chronic kidney disease     06/11/13- not on dialysis  . Shortness of breath     lying down flat  . PTSD (post-traumatic stress disorder)   . Asthma   . COPD (chronic  obstructive pulmonary disease)   . Heart murmur   . GERD (gastroesophageal reflux disease)   . Seizures     "passsed out"  . History of blood transfusion   . Diabetes mellitus without complication     denies    Past Surgical History  Procedure Laterality Date  . Right knee replacement      she says it was last year.  . Esophagogastroduodenoscopy Left 11/14/2012    Procedure: ESOPHAGOGASTRODUODENOSCOPY (EGD);  Surgeon: [name], MD;  Location: [facility];  Service: Endoscopy;  Laterality: Left;  . Joint replacement Right     knee  . Parathyroidectomy    . Av fistula placement Right 06/12/2013    Procedure: ARTERIOVENOUS (AV) FISTULA CREATION; RIGHT  BASILIC VEIN TRANSPOSITION with Intraoperative ultrasound;  Surgeon: Mal [name], MD;  Location: [facility];  Service: Vascular;  Laterality: Right;    Maylon Peppers Humphrey, [place], [place] Pager [phone] After Hours Pager

## 2014-05-14 NOTE — Progress Notes (Addendum)
ANTIBIOTIC CONSULT NOTE - INITIAL  Pharmacy Consult for Ceftriaxone Indication: r/o UTI  Allergies  Allergen Reactions  . Codeine Sulfate Anaphylaxis    Daughter called about having this allergy   . Haldol [Haloperidol Lactate] Shortness Of Breath  . Risperidone And Related Shortness Of Breath  . Gabapentin Other (See Comments)    seizures  . Invega [Paliperidone Er] Nausea And Vomiting  . Trazodone And Nefazodone Other (See Comments)    Makes pt loose balance and fall  . Vistaril [Hydroxyzine Hcl] Nausea And Vomiting    Patient Measurements: Height: 5\' 2"  (157.5 cm) Weight: 202 lb 13.2 oz (92 kg) IBW/kg (Calculated) : 50.1 Vital Signs: Temp: 99.2 F (37.3 C) (01/06 0752) Temp Source: Axillary (01/06 0752) BP: 155/53 mmHg (01/06 1000) Pulse Rate: 110 (01/06 1000) Intake/Output from previous day: 01/05 0701 - 01/06 0700 In: 250 [IV Piggyback:250] Out: 500 [Urine:500] Intake/Output from this shift: Total I/O In: 224 [I.V.:169; IV Piggyback:55] Out: -   Labs:  Recent Labs  05/13/14 1722 05/13/14 1735 05/14/14 0700  WBC 4.3  --  4.4  HGB 9.4* 10.9* 9.2*  PLT 165  --  146*  CREATININE 4.80* 4.40* 5.12*   Estimated Creatinine Clearance: 13.3 mL/min (by C-G formula based on Cr of 5.12). No results for input(s): VANCOTROUGH, VANCOPEAK, VANCORANDOM, GENTTROUGH, GENTPEAK, GENTRANDOM, TOBRATROUGH, TOBRAPEAK, TOBRARND, AMIKACINPEAK, AMIKACINTROU, AMIKACIN in the last 72 hours.   Microbiology: Recent Results (from the past 720 hour(s))  MRSA PCR Screening     Status: None   Collection Time: 05/13/14 11:29 PM  Result Value Ref Range Status   MRSA by PCR NEGATIVE NEGATIVE Final    Comment:        The GeneXpert MRSA Assay (FDA approved for NASAL specimens only), is one component of a comprehensive MRSA colonization surveillance program. It is not intended to diagnose MRSA infection nor to guide or monitor treatment for MRSA infections.     Medical  History: Past Medical History  Diagnosis Date  . Mental disorder   . Depression   . Hypertension   . Overdose   . Tobacco use disorder 11/13/2012  . Complication of anesthesia     difficulty going to sleep  . Chronic kidney disease     06/11/13- not on dialysis  . Shortness of breath     lying down flat  . PTSD (post-traumatic stress disorder)   . Asthma   . COPD (chronic obstructive pulmonary disease)   . Heart murmur   . GERD (gastroesophageal reflux disease)   . Seizures     "passsed out"  . History of blood transfusion   . Diabetes mellitus without complication     denies    Medications:  Anti-infectives    Start     Dose/Rate Route Frequency Ordered Stop   05/14/14 0500  piperacillin-tazobactam (ZOSYN) IVPB 3.375 g  Status:  Discontinued     3.375 g12.5 mL/hr over 240 Minutes Intravenous Every 8 hours 05/14/14 0406 05/14/14 0408   05/14/14 0500  vancomycin (VANCOCIN) IVPB 1000 mg/200 mL premix  Status:  Discontinued     1,000 mg200 mL/hr over 60 Minutes Intravenous Every 48 hours 05/14/14 0406 05/14/14 1004   05/14/14 0500  ceFEPIme (MAXIPIME) 1 g in dextrose 5 % 50 mL IVPB  Status:  Discontinued     1 g100 mL/hr over 30 Minutes Intravenous Every 24 hours 05/14/14 0408 05/14/14 1004     Assessment: 54 year old female with CKD stage V (not on dialysis) admitted  after being found down at home with presumed UTI to start Rocephin.   WBC wnl, Tm 99.2, SCr 4.4 >>5.12. Cefepime dose given at 0600 AM.   Vanc 1/6 x1 Cefepime 1/6 x1  1/6 Blood >> 1/6 Urine >>  Goal of Therapy:  Clinical resolution of infection  Plan:  Rocephin 1g IV every 24 hours - next dose 1/7.  No renal adjustment necessary. Follow-up culture results.   Sloan Leiter, PharmD, BCPS Clinical Pharmacist [phone] 05/14/2014,11:02 AM   Addendum: 05/15/14 1115  Pharmacy will sign off Rocephin consult and follow along on CCM rounds.  Sherlon Handing, PharmD, BCPS Clinical pharmacist, pager  [phone] 05/15/2014 11:13 AM

## 2014-05-14 NOTE — Progress Notes (Signed)
EEG completed; results pending.    

## 2014-05-15 ENCOUNTER — Inpatient Hospital Stay (HOSPITAL_COMMUNITY): Payer: Medicare Other

## 2014-05-15 DIAGNOSIS — J9601 Acute respiratory failure with hypoxia: Secondary | ICD-10-CM

## 2014-05-15 DIAGNOSIS — F319 Bipolar disorder, unspecified: Secondary | ICD-10-CM

## 2014-05-15 DIAGNOSIS — N185 Chronic kidney disease, stage 5: Secondary | ICD-10-CM

## 2014-05-15 LAB — RENAL FUNCTION PANEL
Albumin: 2.3 g/dL — ABNORMAL LOW (ref 3.5–5.2)
Anion gap: 8 (ref 5–15)
BUN: 33 mg/dL — ABNORMAL HIGH (ref 6–23)
CO2: 28 mmol/L (ref 19–32)
Calcium: 9.6 mg/dL (ref 8.4–10.5)
Chloride: 106 mEq/L (ref 96–112)
Creatinine, Ser: 4.05 mg/dL — ABNORMAL HIGH (ref 0.50–1.10)
GFR calc Af Amer: 13 mL/min — ABNORMAL LOW (ref >90)
GFR calc non Af Amer: 12 mL/min — ABNORMAL LOW (ref >90)
Glucose, Bld: 78 mg/dL (ref 70–99)
Phosphorus: 4.7 mg/dL — ABNORMAL HIGH (ref 2.3–4.6)
Potassium: 3.2 mmol/L — ABNORMAL LOW (ref 3.5–5.1)
Sodium: 142 mmol/L (ref 135–145)

## 2014-05-15 LAB — COMPREHENSIVE METABOLIC PANEL
ALT: 8 U/L (ref 0–35)
AST: 12 U/L (ref 0–37)
Albumin: 2.5 g/dL — ABNORMAL LOW (ref 3.5–5.2)
Alkaline Phosphatase: 45 U/L (ref 39–117)
Anion gap: 8 (ref 5–15)
BUN: 42 mg/dL — ABNORMAL HIGH (ref 6–23)
CO2: 26 mmol/L (ref 19–32)
Calcium: 9.4 mg/dL (ref 8.4–10.5)
Chloride: 108 mEq/L (ref 96–112)
Creatinine, Ser: 4.28 mg/dL — ABNORMAL HIGH (ref 0.50–1.10)
GFR calc Af Amer: 13 mL/min — ABNORMAL LOW (ref >90)
GFR calc non Af Amer: 11 mL/min — ABNORMAL LOW (ref >90)
Glucose, Bld: 97 mg/dL (ref 70–99)
Potassium: 3.1 mmol/L — ABNORMAL LOW (ref 3.5–5.1)
Sodium: 142 mmol/L (ref 135–145)
Total Bilirubin: 0.6 mg/dL (ref 0.3–1.2)
Total Protein: 5.4 g/dL — ABNORMAL LOW (ref 6.0–8.3)

## 2014-05-15 LAB — CBC
HCT: 26.8 % — ABNORMAL LOW (ref 36.0–46.0)
Hemoglobin: 8.2 g/dL — ABNORMAL LOW (ref 12.0–15.0)
MCH: 28.9 pg (ref 26.0–34.0)
MCHC: 30.6 g/dL (ref 30.0–36.0)
MCV: 94.4 fL (ref 78.0–100.0)
Platelets: 130 10*3/uL — ABNORMAL LOW (ref 150–400)
RBC: 2.84 MIL/uL — ABNORMAL LOW (ref 3.87–5.11)
RDW: 16.5 % — ABNORMAL HIGH (ref 11.5–15.5)
WBC: 5.1 10*3/uL (ref 4.0–10.5)

## 2014-05-15 LAB — BLOOD GAS, ARTERIAL
Acid-Base Excess: 4 mmol/L — ABNORMAL HIGH (ref 0.0–2.0)
Bicarbonate: 26.7 mEq/L — ABNORMAL HIGH (ref 20.0–24.0)
Drawn by: 39866
FIO2: 0.5 %
MECHVT: 400 mL
O2 Saturation: 96.3 %
PEEP: 5 cmH2O
Patient temperature: 98.5
RATE: 30 resp/min
TCO2: 27.7 mmol/L (ref 0–100)
pCO2 arterial: 31.3 mmHg — ABNORMAL LOW (ref 35.0–45.0)
pH, Arterial: 7.541 — ABNORMAL HIGH (ref 7.350–7.450)
pO2, Arterial: 87.5 mmHg (ref 80.0–100.0)

## 2014-05-15 LAB — PROCALCITONIN: Procalcitonin: <0.1 ng/mL

## 2014-05-15 LAB — GLUCOSE, CAPILLARY
Glucose-Capillary: 73 mg/dL (ref 70–99)
Glucose-Capillary: 75 mg/dL (ref 70–99)
Glucose-Capillary: 84 mg/dL (ref 70–99)
Glucose-Capillary: 86 mg/dL (ref 70–99)
Glucose-Capillary: 91 mg/dL (ref 70–99)
Glucose-Capillary: 91 mg/dL (ref 70–99)
Glucose-Capillary: 93 mg/dL (ref 70–99)
Glucose-Capillary: 94 mg/dL (ref 70–99)
Glucose-Capillary: 95 mg/dL (ref 70–99)
Glucose-Capillary: 96 mg/dL (ref 70–99)
Glucose-Capillary: 97 mg/dL (ref 70–99)

## 2014-05-15 LAB — URINE CULTURE
Colony Count: NO GROWTH
Culture: NO GROWTH

## 2014-05-15 LAB — MAGNESIUM: Magnesium: 2.4 mg/dL (ref 1.5–2.5)

## 2014-05-15 LAB — PARATHYROID HORMONE, INTACT (NO CA): PTH: 429 pg/mL — ABNORMAL HIGH (ref 15–65)

## 2014-05-15 MED ORDER — POTASSIUM CHLORIDE 10 MEQ/50ML IV SOLN
10.0000 meq | Freq: Once | INTRAVENOUS | Status: AC
  Administered 2014-05-15: 10 meq via INTRAVENOUS
  Filled 2014-05-15: qty 50

## 2014-05-15 MED ORDER — NEPRO/CARBSTEADY PO LIQD
1000.0000 mL | ORAL | Status: DC
  Filled 2014-05-15: qty 1000

## 2014-05-15 MED ORDER — POTASSIUM CHLORIDE 10 MEQ/100ML IV SOLN: 10.0000 meq | Freq: Once | INTRAVENOUS | Status: DC

## 2014-05-15 MED ORDER — PRO-STAT SUGAR FREE PO LIQD
30.0000 mL | Freq: Two times a day (BID) | ORAL | Status: DC
  Filled 2014-05-15: qty 30

## 2014-05-15 MED ORDER — NEPRO/CARBSTEADY PO LIQD: 1000.0000 mL | ORAL | Status: DC

## 2014-05-15 MED ORDER — PRO-STAT SUGAR FREE PO LIQD
30.0000 mL | Freq: Two times a day (BID) | ORAL | Status: DC
  Administered 2014-05-15: 30 mL
  Filled 2014-05-15 (×3): qty 30

## 2014-05-15 MED ORDER — DARBEPOETIN ALFA 60 MCG/0.3ML IJ SOSY
60.0000 ug | PREFILLED_SYRINGE | INTRAMUSCULAR | Status: DC
  Administered 2014-05-15: 60 ug via INTRAVENOUS
  Filled 2014-05-15: qty 0.3

## 2014-05-15 MED ORDER — SODIUM CHLORIDE 0.9 % IV SOLN
250.0000 mg | INTRAVENOUS | Status: AC
  Administered 2014-05-15 – 2014-05-20 (×4): 250 mg via INTRAVENOUS
  Filled 2014-05-15 (×7): qty 20

## 2014-05-15 MED ORDER — VITAL HIGH PROTEIN PO LIQD: 1000.0000 mL | ORAL | Status: DC

## 2014-05-15 MED ORDER — VITAL HIGH PROTEIN PO LIQD
1000.0000 mL | ORAL | Status: DC
  Administered 2014-05-15: 1000 mL
  Filled 2014-05-15 (×4): qty 1000

## 2014-05-15 NOTE — Progress Notes (Signed)
20cc of fentanyl wasted in room sink.  Witnessed by Rito Ehrlich, RN.

## 2014-05-15 NOTE — Progress Notes (Signed)
RT note- re attempt to wean, continue to monitor.

## 2014-05-15 NOTE — Progress Notes (Signed)
Patient ID: Jennifer Lawson, female   DOB: [birth year], 54 y.o.   MRN: [id number]  [facility] Progress Note   Assessment/ Plan:   1. End-stage renal disease from progressive chronic kidney disease: Hemodialysis started yesterday for management of multiple metabolic abnormalities and possible uremic encephalopathy. Second treatment scheduled for today.  2. Metabolic encephalopathy: Suspected to be primarily from uremic encephalopathy and with progressive metabolic acidosis-monitor after hemodialysis today with lightened sedation. 3. Anion gap metabolic acidosis: Secondary to acute renal failure, managed with hemodialysis-discontinue sodium bicarbonate drip. 4. Anemia of chronic disease: Start intravenous iron for iron deficiency and ESA. 5. Metabolic bone disease: Check phosphorus levels/reconcile medications. 6. Hypokalemia: Use 4K dialysate bath today  Subjective:   Agitation noted overnight requiring increased sedation    Objective:   BP 93/45 mmHg  Pulse 73  Temp(Src) 98.5 F (36.9 C) (Oral)  Resp 22  Ht 5\' 2"  (1.575 m)  Wt 89.9 kg (198 lb 3.1 oz)  BMI 36.24 kg/m2  SpO2 100%  LMP 05/09/2008  Physical Exam: Gen: Currently, comfortably resting in bed on the vent CVS: Pulse regular in rate and rhythm Resp: Clear to auscultation-no rales/rhonchi Abd: Soft, flat, nontender Ext: 1+ lower extremity edema  Labs: BMET  Recent Labs Lab 05/13/14 1722 05/13/14 1735 05/14/14 0700 05/14/14 2120 05/15/14 0443  NA 142 141 144 142 142  K 5.1 5.1 4.5 3.3* 3.1*  CL 110 114* 117* 107 108  CO2 19  --  16* 24 26  GLUCOSE 77 72 120* 99 97  BUN 60* 58* 64* 40* 42*  CREATININE 4.80* 4.40* 5.12* 4.01* 4.28*  CALCIUM 9.0  --  9.0 9.3 9.4   CBC  Recent Labs Lab 05/13/14 1722 05/13/14 1735 05/14/14 0700 05/15/14 0443  WBC 4.3  --  4.4 5.1  NEUTROABS 2.9  --  3.2  --   HGB 9.4* 10.9* 9.2* 8.2*  HCT 30.7* 32.0* 29.9* 26.8*  MCV 97.2  --  100.7* 94.4  PLT 165  --   146* 130*   Medications:    . antiseptic oral rinse  7 mL Mouth Rinse BID  . antiseptic oral rinse  7 mL Mouth Rinse QID  . cefTRIAXone (ROCEPHIN)  IV  1 g Intravenous Q24H  . chlorhexidine  15 mL Mouth Rinse BID  . heparin  5,000 Units Subcutaneous 3 times per day  . ipratropium-albuterol  3 mL Nebulization Q6H  . pantoprazole (PROTONIX) IV  40 mg Intravenous Q24H  . sodium chloride  3 mL Intravenous Q12H  . thiamine IV  100 mg Intravenous Daily  . valproate sodium  500 mg Intravenous Q12H   Elmarie Shiley, MD 05/15/2014, 8:29 AM

## 2014-05-15 NOTE — Progress Notes (Signed)
RT note-attempted to wean, remains sleepy continue to monitor.

## 2014-05-15 NOTE — Progress Notes (Signed)
NUTRITION FOLLOW UP  DOCUMENTATION CODES Per approved criteria  -Obesity Unspecified   Intervention:    Initiate TF via OGT with Nepro at 25 ml/h and Prostat 30 ml BID on day 1; on day 2, increase to goal rate of 35 ml/h (840 ml per day) to provide 1712 kcals, 98 gm protein, 611 ml free water daily.  Nutrition Dx:   Inadequate oral intake related to inability to eat as evidenced by NPO status, ongoing.  Goal:   Intake to meet >90% of estimated nutrition needs, unmet.  Monitor:   TF tolerance/adequacy, weight trend, labs, vent status.  Assessment:   Pt with hx of heavy smoking, Bipolar 1 (on multiple medications) and CKD stage V (fistula placed 06/2013) admitted with acute encephalopathy with mixed acid base balance disorder. Required intubation on 1/6.  Discussed patient in ICU rounds today. Plans to start TF today if unable to wean and extubate patient. Patient was just placed back on the ventilator, unable to extubate today. RD to start TF.  S/P 2nd hemodialysis treatment today.  Patient is currently intubated on ventilator support MV: 9.6 L/min Temp (24hrs), Avg:98.7 F (37.1 C), Min:98 F (36.7 C), Max:99.5 F (37.5 C)  Propofol: none   Height: Ht Readings from Last 1 Encounters:  05/13/14 5\' 2"  (1.575 m)    Weight Status:   Wt Readings from Last 1 Encounters:  05/15/14 198 lb 3.1 oz (89.9 kg)   05/13/14 202 lb 13.2 oz (92 kg)   Re-estimated needs:  Kcal: 1746 Protein: 100 gm Fluid: 1.8-2 L  Skin: no issues  Diet Order: Diet NPO time specified   Intake/Output Summary (Last 24 hours) at 05/15/14 1521 Last data filed at 05/15/14 1500  Gross per 24 hour  Intake 2312.81 ml  Output   2048 ml  Net 264.81 ml    Last BM: 1/6   Labs:   Recent Labs Lab 05/14/14 0700 05/14/14 2120 05/15/14 0443  NA 144 142 142  K 4.5 3.3* 3.1*  CL 117* 107 108  CO2 16* 24 26  BUN 64* 40* 42*  CREATININE 5.12* 4.01* 4.28*  CALCIUM 9.0 9.3 9.4  GLUCOSE 120*  99 97    CBG (last 3)   Recent Labs  05/15/14 0614 05/15/14 0829 05/15/14 1140  GLUCAP 86 91 84    Scheduled Meds: . antiseptic oral rinse  7 mL Mouth Rinse BID  . antiseptic oral rinse  7 mL Mouth Rinse QID  . chlorhexidine  15 mL Mouth Rinse BID  . darbepoetin (ARANESP) injection - DIALYSIS  60 mcg Intravenous Q Thu-HD  . ferric gluconate (FERRLECIT/NULECIT) IV  250 mg Intravenous Q T,Th,S,Su  . heparin  5,000 Units Subcutaneous 3 times per day  . ipratropium-albuterol  3 mL Nebulization Q6H  . pantoprazole (PROTONIX) IV  40 mg Intravenous Q24H  . sodium chloride  3 mL Intravenous Q12H  . thiamine IV  100 mg Intravenous Daily  . valproate sodium  500 mg Intravenous Q12H    Continuous Infusions: . fentaNYL infusion INTRAVENOUS Stopped (05/15/14 1145)    Molli Barrows, RD, LDN, [place] Pager [phone] After Hours Pager [phone]

## 2014-05-15 NOTE — Progress Notes (Signed)
Pt HD tx ended after 90 mins due to pt restlessness. Primary nurse present and aware. She tried to sedate pt w/ higher does of fentanyl. Pt. Slipped out of restraint and repeatedly bent access arm. I paged for primary RN twice during this episode. VP and AP will not clear off of HD machine now. Bump above arterial site. MD paged.

## 2014-05-15 NOTE — Progress Notes (Signed)
Subjective: Patient intubated and transferred to ICU yesterday due to progressive decline in mental status. Nephrology has been consulted.   EEG completed shows generalized irregular slowing consistent with a mild-moderate encephalopathy.    Objective: Current vital signs: BP 93/45 mmHg  Pulse 73  Temp(Src) 98.5 F (36.9 C) (Oral)  Resp 22  Ht 5\' 2"  (1.575 m)  Wt 89.9 kg (198 lb 3.1 oz)  BMI 36.24 kg/m2  SpO2 100%  LMP 05/09/2008 Vital signs in last 24 hours: Temp:  [98.5 F (36.9 C)-99.7 F (37.6 C)] 98.5 F (36.9 C) (01/07 0438) Pulse Rate:  [73-111] 73 (01/07 0700) Resp:  [14-30] 22 (01/07 0700) BP: (68-164)/(28-100) 93/45 mmHg (01/07 0700) SpO2:  [96 %-100 %] 100 % (01/07 0757) FiO2 (%):  [40 %-100 %] 40 % (01/07 0757) Weight:  [89.9 kg (198 lb 3.1 oz)-91.6 kg (201 lb 15.1 oz)] 89.9 kg (198 lb 3.1 oz) (01/07 0522)  Intake/Output from previous day: 01/06 0701 - 01/07 0700 In: 2391.8 [I.V.:1881.8; NG/GT:150; IV Piggyback:210] Out: 2105 [Urine:905; Emesis/NG output:300] Intake/Output this shift:   Nutritional status: Diet NPO time specified  Neurologic Exam: Mental Status: Lethargic, requires frequent stimulation to stay awake. Disoriented to name, location, date. Intermittently follows simple commands. Minimal verbal output. Moderate dysarthria noted.  Cranial Nerves: II: visual fields grossly normal, pupils equal, round, reactive to light III,IV, VI: ptosis not present, eyes midline, does not track but will moves eyes to right and left, no noted gaze preference V,VII: smile symmetric, facial light touch sensation normal bilaterally  Motor: Not following commands so unable to formally test but appears to move all extremities though moves L>R Sensory: Pinprick intact throughout but withdrawals more briskly on left Deep Tendon Reflexes: 1+ bilateral UE, absent patellar and achilles (marked edema) Plantars: Right: downgoingLeft:  downgoing Lab Results: Basic Metabolic Panel:  Recent Labs Lab 05/13/14 1722 05/13/14 1735 05/14/14 0700 05/14/14 2120 05/15/14 0443  NA 142 141 144 142 142  K 5.1 5.1 4.5 3.3* 3.1*  CL 110 114* 117* 107 108  CO2 19  --  16* 24 26  GLUCOSE 77 72 120* 99 97  BUN 60* 58* 64* 40* 42*  CREATININE 4.80* 4.40* 5.12* 4.01* 4.28*  CALCIUM 9.0  --  9.0 9.3 9.4    Liver Function Tests:  Recent Labs Lab 05/13/14 1722 05/14/14 0700 05/15/14 0443  AST 13 11 12   ALT 11 9 8   ALKPHOS 55 53 45  BILITOT 1.1 1.1 0.6  PROT 6.9 6.0 5.4*  ALBUMIN 3.4* 2.8* 2.5*    Recent Labs Lab 05/13/14 1810  LIPASE 25    Recent Labs Lab 05/13/14 1808 05/14/14 2200  AMMONIA 25 11    CBC:  Recent Labs Lab 05/13/14 1722 05/13/14 1735 05/14/14 0700 05/15/14 0443  WBC 4.3  --  4.4 5.1  NEUTROABS 2.9  --  3.2  --   HGB 9.4* 10.9* 9.2* 8.2*  HCT 30.7* 32.0* 29.9* 26.8*  MCV 97.2  --  100.7* 94.4  PLT 165  --  146* 130*    Cardiac Enzymes: No results for input(s): CKTOTAL, CKMB, CKMBINDEX, TROPONINI in the last 168 hours.  Lipid Panel:  Recent Labs Lab 05/14/14 0700  CHOL 179  TRIG 190*  HDL 53  CHOLHDL 3.4  VLDL 38  LDLCALC 88    CBG:  Recent Labs Lab 05/14/14 1930 05/14/14 2131 05/15/14 0023 05/15/14 0242 05/15/14 0614  GLUCAP 94 96 97 94 86    Microbiology: Results for orders placed or  performed during the hospital encounter of 05/13/14  MRSA PCR Screening     Status: None   Collection Time: 05/13/14 11:29 PM  Result Value Ref Range Status   MRSA by PCR NEGATIVE NEGATIVE Final    Comment:        The GeneXpert MRSA Assay (FDA approved for NASAL specimens only), is one component of a comprehensive MRSA colonization surveillance program. It is not intended to diagnose MRSA infection nor to guide or monitor treatment for MRSA infections.   Culture, blood (routine x 2)     Status: None (Preliminary result)   Collection Time: 05/14/14  5:40 AM  Result  Value Ref Range Status   Specimen Description BLOOD LEFT HAND  Final   Special Requests BOTTLES DRAWN AEROBIC AND ANAEROBIC 10CC EACH  Final   Culture   Final           BLOOD CULTURE RECEIVED NO GROWTH TO DATE CULTURE WILL BE HELD FOR 5 DAYS BEFORE ISSUING A FINAL NEGATIVE REPORT Performed at [facility]    Report Status PENDING  Incomplete  Culture, blood (routine x 2)     Status: None (Preliminary result)   Collection Time: 05/14/14  7:00 AM  Result Value Ref Range Status   Specimen Description BLOOD LEFT ARM  Final   Special Requests BOTTLES DRAWN AEROBIC ONLY 6CC  Final   Culture   Final           BLOOD CULTURE RECEIVED NO GROWTH TO DATE CULTURE WILL BE HELD FOR 5 DAYS BEFORE ISSUING A FINAL NEGATIVE REPORT Performed at [facility]    Report Status PENDING  Incomplete    Coagulation Studies:  Recent Labs  05/13/14 1722  LABPROT 14.8  INR 1.15    Imaging: Ct Head (brain) Wo Contrast  05/13/2014   CLINICAL DATA:  Altered mental status, urinary retention  EXAM: CT HEAD WITHOUT CONTRAST  TECHNIQUE: Contiguous axial images were obtained from the base of the skull through the vertex without intravenous contrast.  COMPARISON:  01/15/2014 and brain MRI a 01/16/2014  FINDINGS: No skull fracture is noted. Again noted mild mucosal thickening posterior aspect of left maxillary sinus. The sphenoid sinuses and mastoid air cells are unremarkable. Frontal sinuses are unremarkable.  No intracranial hemorrhage, mass effect or midline shift. No acute cortical infarction. No mass lesion is noted on this unenhanced scan. The gray and white-matter differentiation is preserved.  IMPRESSION: No acute intracranial abnormality. Stable mild mucosal thickening posterior aspect of the left maxillary sinus. No definite acute cortical infarction.   Electronically Signed   By: Lahoma Crocker M.D.   On: 05/13/2014 17:27   Mr Brain Wo Contrast  05/14/2014   CLINICAL DATA:  Increasing lethargy for 3  days , encephalopathy. Renal failure.  EXAM: MRI HEAD WITHOUT CONTRAST  TECHNIQUE: Multiplanar, multiecho pulse sequences of the brain and surrounding structures were obtained without intravenous contrast.  COMPARISON:  CT of the head May 13, 2014 and MRI of the head January 16, 2014  FINDINGS: Mild motion degraded examination.  The ventricles and sulci are normal. No abnormal parenchymal signal, mass lesions or mass of affect. No reduced diffusion to suggest acute ischemia. No susceptibility artifact to suggest hemorrhage. Mildly dolichoectatic intracranial vessels.  No abnormal extra-axial fluid collection. No abnormal calvarial bone marrow signal. Ocular globes and orbital contents are unremarkable though not tailored for evaluation. LEFT maxillary mucosal retention cyst without paranasal sinus air-fluid levels. The mastoid air cells appear well-aerated. No abnormal  sellar expansion. Craniocervical junction is maintained. The patient is edentulous.  IMPRESSION: No acute intracranial process, specifically no evidence of acute ischemia.  Mildly dolicoectatic intracranial vessels can be seen with chronic hypertension.   Electronically Signed   By: Elon Alas   On: 05/14/2014 05:19   Dg Chest Port 1 View  05/15/2014   CLINICAL DATA:  Acute respiratory failure.  Hypoxia.  EXAM: PORTABLE CHEST - 1 VIEW  COMPARISON:  05/14/2014.  FINDINGS: Endotracheal tube tip 2.8 cm above the carina. Left IJ line in stable position. Stable cardiomegaly. Mild pulmonary venous congestion noted on today's exam. Left lower lobe atelectasis and/or infiltrate, progressive from prior exam. Small left pleural effusion cannot be excluded. No pneumothorax. Surgical clips right axilla. No acute bony abnormality.  IMPRESSION: 1. Endotracheal tube tip 2.8 cm above the carina. Left IJ line in stable position. 2. Cardiomegaly with mild pulmonary venous congestion. 3. Progressive left lower lobe atelectasis and/or infiltrate. Small left  pleural effusion cannot be excluded.   Electronically Signed   By: Marcello Moores  Register   On: 05/15/2014 07:39   Dg Chest Portable 1 View  05/14/2014   CLINICAL DATA:  Acute respiratory failure. Intubation and central line placement.  EXAM: PORTABLE CHEST - 1 VIEW  COMPARISON:  05/14/2014  FINDINGS: Endotracheal tube tip is approximately 1 cm above the carina. Nasogastric tube is seen entering the stomach. A new left internal jugular central venous catheter is seen with tip overlying the distal SVC. No pneumothorax visualized.  Mild linear opacity in the left retrocardiac lung back is this consistent with mild subsegmental atelectasis. No evidence of pulmonary consolidation or pleural effusion.  IMPRESSION: Endotracheal tube tip approximately 1 cm above carina.  Left jugular central venous catheter and nasogastric tube in appropriate position. No pneumothorax visualized.  Mild left basilar atelectasis.   Electronically Signed   By: Earle Gell M.D.   On: 05/14/2014 13:11   Dg Chest Port 1 View  05/14/2014   CLINICAL DATA:  Fever, shortness of breath  EXAM: PORTABLE CHEST - 1 VIEW  COMPARISON:  04/12/2014  FINDINGS: Shallow inspiration. Cardiac enlargement with mild prominence of pulmonary vascularity suggesting mild vascular congestion. No focal airspace disease or edema. No blunting of costophrenic angles. No pneumothorax. Mediastinal contours appear intact.  IMPRESSION: Cardiac enlargement with mild pulmonary vascular congestion. No edema or consolidation.   Electronically Signed   By: Lucienne Capers M.D.   On: 05/14/2014 05:30    Medications:  Scheduled: . antiseptic oral rinse  7 mL Mouth Rinse BID  . antiseptic oral rinse  7 mL Mouth Rinse QID  . cefTRIAXone (ROCEPHIN)  IV  1 g Intravenous Q24H  . chlorhexidine  15 mL Mouth Rinse BID  . heparin  5,000 Units Subcutaneous 3 times per day  . ipratropium-albuterol  3 mL Nebulization Q6H  . pantoprazole (PROTONIX) IV  40 mg Intravenous Q24H  . sodium  chloride  3 mL Intravenous Q12H  . thiamine IV  100 mg Intravenous Daily  . valproate sodium  500 mg Intravenous Q12H    Assessment/Plan: 54 y/o women with history of renal failure, bipolar disorder presenting for evaluation of altered mental status. Symptoms have been getting progressively worse for past 2-3 days. Encephalopathy is likely multifactorial and related to metabolic, renal and respiratory etiology with polypharmacy playing a role. Nephrology following patient, scheduled for dialysis today. MRI brain shows no acute infarct.   -vent, metabolic and renal management per CCM and nephrology -limit sedating medications as tolerated -  no further neurological workup indicated at this time. Will follow as needed.    LOS: 2 days   Jim Like, DO [facility] [phone]  If 7pm- 7am, please page neurology on call as listed in AMION. 05/15/2014  8:00 AM

## 2014-05-15 NOTE — Progress Notes (Signed)
PULMONARY / CRITICAL CARE MEDICINE   Name: Jennifer Lawson MRN: [id number] DOB: 1961/05/02    ADMISSION DATE:  05/13/2014 CONSULTATION DATE:  05/14/14  REFERRING MD :  Dr. Tyrell Antonio   CHIEF COMPLAINT:  AMS  INITIAL PRESENTATION:   54yo female DM II, hx seizure, COPD, heavy smoker with extensive psych hx, CKD V (fistula placed 06/2013), admitted 1/6 with AMS, metabolic acidosis.  PCCM consulted for increasing lethargy, electrolyte abnormalities, acidosis.     STUDIES:  1/5  CT Head >> no acute abnormality 1/6  MRI Head >> no acute process  1/6 EEG >>> 1/7 cxr : cardiomegaly, mild pulm venous congestion, LLL atelectasis vs infiltrate.   SIGNIFICANT EVENTS: 1/6 ETT placed 1/7- HD  SUBJECTIVE: on hd  VITAL SIGNS: Temp:  [98.5 F (36.9 C)-99.7 F (37.6 C)] 98.5 F (36.9 C) (01/07 0438) Pulse Rate:  [73-111] 73 (01/07 0700) Resp:  [14-30] 22 (01/07 0700) BP: (68-164)/(28-100) 93/45 mmHg (01/07 0700) SpO2:  [96 %-100 %] 100 % (01/07 0757) FiO2 (%):  [40 %-100 %] 40 % (01/07 0757) Weight:  [89.9 kg (198 lb 3.1 oz)-91.6 kg (201 lb 15.1 oz)] 89.9 kg (198 lb 3.1 oz) (01/07 0522) HEMODYNAMICS: CVP:  [3 mmHg-13 mmHg] 5 mmHg VENTILATOR SETTINGS: Vent Mode:  [-] PRVC FiO2 (%):  [40 %-100 %] 40 % Set Rate:  [16 bmp-30 bmp] 22 bmp Vt Set:  [400 mL] 400 mL PEEP:  [5 cmH20] 5 cmH20 Plateau Pressure:  [14 cmH20-28 cmH20] 14 cmH20 INTAKE / OUTPUT:  Intake/Output Summary (Last 24 hours) at 05/15/14 0810 Last data filed at 05/15/14 0600  Gross per 24 hour  Intake 2391.81 ml  Output   2105 ml  Net 286.81 ml    PHYSICAL EXAMINATION: General:  Chronically ill appearing female, NAD  Neuro: unarousable.  HEENT:  Mm dry, no JVD  Cardiovascular:  s1s2 rrr Lungs:  Intubated. Coarse. Abdomen:  Soft, non tender, non distended Musculoskeletal:  Warm and dry, generalized edema, 2+ BLE edema, LLE posterior large hematoma   LABS: Lactic acid normal, depakote 41 (therapeutic). Ammonia  normal.  procalcitonin neg. TSH nl. uds negative.  CBC  Recent Labs Lab 05/13/14 1722 05/13/14 1735 05/14/14 0700 05/15/14 0443  WBC 4.3  --  4.4 5.1  HGB 9.4* 10.9* 9.2* 8.2*  HCT 30.7* 32.0* 29.9* 26.8*  PLT 165  --  146* 130*   Coag's  Recent Labs Lab 05/13/14 1722  APTT 38*  INR 1.15   BMET  Recent Labs Lab 05/14/14 0700 05/14/14 2120 05/15/14 0443  NA 144 142 142  K 4.5 3.3* 3.1*  CL 117* 107 108  CO2 16* 24 26  BUN 64* 40* 42*  CREATININE 5.12* 4.01* 4.28*  GLUCOSE 120* 99 97   Electrolytes  Recent Labs Lab 05/14/14 0700 05/14/14 2120 05/15/14 0443  CALCIUM 9.0 9.3 9.4   Sepsis Markers  Recent Labs Lab 05/14/14 1200 05/14/14 2120 05/15/14 0443  LATICACIDVEN  --  1.1  --   PROCALCITON <0.10  --  <0.10   ABG  Recent Labs Lab 05/14/14 1250 05/14/14 2135 05/15/14 0453  PHART 7.115* 7.452* 7.541*  PCO2ART 64.8* 35.9 31.3*  PO2ART 341.0* 43.1* 87.5   Liver Enzymes  Recent Labs Lab 05/13/14 1722 05/14/14 0700 05/15/14 0443  AST 13 11 12   ALT 11 9 8   ALKPHOS 55 53 45  BILITOT 1.1 1.1 0.6  ALBUMIN 3.4* 2.8* 2.5*   Cardiac Enzymes No results for input(s): TROPONINI, PROBNP in the last 168 hours.  Glucose  Recent Labs Lab 05/14/14 1214 05/14/14 1930 05/14/14 2131 05/15/14 0023 05/15/14 0242 05/15/14 0614  GLUCAP 86 94 96 97 94 86    Imaging Mr Brain Wo Contrast  05/14/2014   CLINICAL DATA:  Increasing lethargy for 3 days , encephalopathy. Renal failure.  EXAM: MRI HEAD WITHOUT CONTRAST  TECHNIQUE: Multiplanar, multiecho pulse sequences of the brain and surrounding structures were obtained without intravenous contrast.  COMPARISON:  CT of the head May 13, 2014 and MRI of the head January 16, 2014  FINDINGS: Mild motion degraded examination.  The ventricles and sulci are normal. No abnormal parenchymal signal, mass lesions or mass of affect. No reduced diffusion to suggest acute ischemia. No susceptibility artifact to  suggest hemorrhage. Mildly dolichoectatic intracranial vessels.  No abnormal extra-axial fluid collection. No abnormal calvarial bone marrow signal. Ocular globes and orbital contents are unremarkable though not tailored for evaluation. LEFT maxillary mucosal retention cyst without paranasal sinus air-fluid levels. The mastoid air cells appear well-aerated. No abnormal sellar expansion. Craniocervical junction is maintained. The patient is edentulous.  IMPRESSION: No acute intracranial process, specifically no evidence of acute ischemia.  Mildly dolicoectatic intracranial vessels can be seen with chronic hypertension.   Electronically Signed   By: Elon Alas   On: 05/14/2014 05:19   Dg Chest Portable 1 View  05/14/2014   CLINICAL DATA:  Acute respiratory failure. Intubation and central line placement.  EXAM: PORTABLE CHEST - 1 VIEW  COMPARISON:  05/14/2014  FINDINGS: Endotracheal tube tip is approximately 1 cm above the carina. Nasogastric tube is seen entering the stomach. A new left internal jugular central venous catheter is seen with tip overlying the distal SVC. No pneumothorax visualized.  Mild linear opacity in the left retrocardiac lung back is this consistent with mild subsegmental atelectasis. No evidence of pulmonary consolidation or pleural effusion.  IMPRESSION: Endotracheal tube tip approximately 1 cm above carina.  Left jugular central venous catheter and nasogastric tube in appropriate position. No pneumothorax visualized.  Mild left basilar atelectasis.   Electronically Signed   By: Earle Gell M.D.   On: 05/14/2014 13:11   Dg Chest Port 1 View  05/14/2014   CLINICAL DATA:  Fever, shortness of breath  EXAM: PORTABLE CHEST - 1 VIEW  COMPARISON:  04/12/2014  FINDINGS: Shallow inspiration. Cardiac enlargement with mild prominence of pulmonary vascularity suggesting mild vascular congestion. No focal airspace disease or edema. No blunting of costophrenic angles. No pneumothorax. Mediastinal  contours appear intact.  IMPRESSION: Cardiac enlargement with mild pulmonary vascular congestion. No edema or consolidation.   Electronically Signed   By: Lucienne Capers M.D.   On: 05/14/2014 05:30     ASSESSMENT / PLAN:  PULMONARY A: Acute hypercarbic resp failure with chronic COPD hx - likely 2/2 to AMS. - intubated 1/6 Tobacco Abuse COPD -  Mixed Acidosis - improved with bicarb drip and after intubation. alkalosis now  P:   Will need to reduce RR on vent to avoid further resp alkalosis, stopped bicarb drip. ABg noted, rr to 12 Then SBT planned Try weaning sedation but might be difficult as she was agitated last night. On fentanyl drip. WUA Neg balance with hd pcxr in am for volume status  CARDIOVASCULAR Hx Hypertension but now hypotensive.  Volume overload  Last echo no CHF.  P:  Do HD to improve volume status.  Hold antihypertensives and lasix for now. Maintain negative balance with HD. Might be difficult with her borderline BP.    RENAL A:  ESRD from progression of CKD IV- Baseline Scr ~4.0.  RUE fistula placed 06/2013. Anion gap Metabolic Acidosis HypoK+  P:   Started HD yesterday. Will do HD today as well. Nephrology following. Was on Bicarb drip for acidosis. Agree with nephro, stop drip.  Will not give any fluid for now but may require boluses intermittently if BP soft.  GASTROINTESTINAL A:   Severe Protein Calorie Malnutrition ETOH  P:   NPO. May need feeding tube tomorrow if continues to require intubation.  Thiamine/folate. Will need swallow eval once mental status allows PO's   HEMATOLOGIC A:   Acute on chronic normocytic Anemia, likely 2/2 to CKD, not sure about cause of acute worsening L Calf Hematoma - trauma? P:  BLE dopplers negative for DVT.  Monitor CBC and hematoma.  tx <7 hgb. Xray leg  INFECTIOUS No clear sources of infection P:   UA negative. ucx pending but not needed. BCx2 1/6 >>  UC 1/6 >>   Vancomycin 1/6>>>1/6 Cefepime  1/6>>>1/6 Ceftriaxone 1/6>>>    No evidence HCAP on CXR, no leukocytosis. PCT negative.UA normal. All cultures negative.  Dc ABX and observe   ENDOCRINE A:   Hypoglycemia - likely 2/2 to no PO intake. No DM. hgba1c normal.  P:   q4hr CBG.    NEUROLOGIC A:   Acute Encephalopathy - unclear etiology.  But likely 2/2 to uremia.   CT, MRI head ok.  Lactic acid normal, depakote 41 (therapeutic). Ammonia normal.  procalcitonin neg. TSH nl. uds negative.  Bipolar Depression - with hx of overdose in past PTSD P:   EEG completed shows generalized irregular slowing consistent with a mild-moderate encephalopathy.  Monitor resp status Expecting improvement with HD.  Cont depakote, level assessed Reduce sedatives as much as possible. On fentanyl currently Hold other psych meds: geodon, restoril, sominex (for sleep).  FAMILY  - Updates: daughter updated at length at bedside 1/6. No family today.  - Inter-disciplinary family meet or Palliative Care meeting due by:  1/13  Dellia Nims, M.D PGY-1 Internal Medicine Pager [phone]  STAFF NOTE: I, Merrie Roof, MD FACP have personally reviewed patient's available data, including medical history, events of note, physical examination and test results as part of my evaluation. I have discussed with resident/NP and other care providers such as pharmacist, RN and RRT. In addition, I personally evaluated patient and elicited key findings of: more awake, on HD, post HD, wean cpap 5 ps5, if not extubated then start tf, coarse improved on exam, dc all abx The patient is critically ill with multiple organ systems failure and requires high complexity decision making for assessment and support, frequent evaluation and titration of therapies, application of advanced monitoring technologies and extensive interpretation of multiple databases.   Critical Care Time devoted to patient care services described in this note is30 Minutes. This time reflects  time of care of this signee: Merrie Roof, MD FACP. This critical care time does not reflect procedure time, or teaching time or supervisory time of PA/NP/Med student/Med Resident etc but could involve care discussion time. Rest per NP/medical resident whose note is outlined above and that I agree with   Lavon Paganini. Titus Mould, MD, [place] Pgr: [facility] 05/15/2014 11:07 AM

## 2014-05-15 NOTE — Progress Notes (Signed)
Upon cannulation today for HD there was a small bruise above the venous site. There was also a soft lump at the arterial site. Unsure what these were caused by.  Cannulated w/ 17g needles. Venous no difficulties. Arterial site had clots so first stick was pulled. 2nd stick was fine. Pt. Running at 300 bfr w/ 17g needles as ordered. Sedated and vss.

## 2014-05-16 ENCOUNTER — Inpatient Hospital Stay (HOSPITAL_COMMUNITY): Payer: Medicare Other

## 2014-05-16 DIAGNOSIS — Z452 Encounter for adjustment and management of vascular access device: Secondary | ICD-10-CM

## 2014-05-16 LAB — CBC
HCT: 26.4 % — ABNORMAL LOW (ref 36.0–46.0)
Hemoglobin: 7.8 g/dL — ABNORMAL LOW (ref 12.0–15.0)
MCH: 29 pg (ref 26.0–34.0)
MCHC: 29.5 g/dL — ABNORMAL LOW (ref 30.0–36.0)
MCV: 98.1 fL (ref 78.0–100.0)
Platelets: 120 10*3/uL — ABNORMAL LOW (ref 150–400)
RBC: 2.69 MIL/uL — ABNORMAL LOW (ref 3.87–5.11)
RDW: 16.6 % — ABNORMAL HIGH (ref 11.5–15.5)
WBC: 6.2 10*3/uL (ref 4.0–10.5)

## 2014-05-16 LAB — GLUCOSE, CAPILLARY
Glucose-Capillary: 110 mg/dL — ABNORMAL HIGH (ref 70–99)
Glucose-Capillary: 81 mg/dL (ref 70–99)
Glucose-Capillary: 83 mg/dL (ref 70–99)
Glucose-Capillary: 84 mg/dL (ref 70–99)
Glucose-Capillary: 84 mg/dL (ref 70–99)
Glucose-Capillary: 94 mg/dL (ref 70–99)

## 2014-05-16 LAB — BASIC METABOLIC PANEL
Anion gap: 5 (ref 5–15)
BUN: 35 mg/dL — ABNORMAL HIGH (ref 6–23)
CO2: 30 mmol/L (ref 19–32)
Calcium: 9.8 mg/dL (ref 8.4–10.5)
Chloride: 108 mEq/L (ref 96–112)
Creatinine, Ser: 4.4 mg/dL — ABNORMAL HIGH (ref 0.50–1.10)
GFR calc Af Amer: 12 mL/min — ABNORMAL LOW (ref >90)
GFR calc non Af Amer: 10 mL/min — ABNORMAL LOW (ref >90)
Glucose, Bld: 118 mg/dL — ABNORMAL HIGH (ref 70–99)
Potassium: 3.7 mmol/L (ref 3.5–5.1)
Sodium: 143 mmol/L (ref 135–145)

## 2014-05-16 LAB — PROCALCITONIN: Procalcitonin: 1.98 ng/mL

## 2014-05-16 LAB — VITAMIN B1: Vitamin B1 (Thiamine): 15 nmol/L (ref 8–30)

## 2014-05-16 MED ORDER — WHITE PETROLATUM GEL
Status: AC
  Administered 2014-05-17: 0.2
  Filled 2014-05-16: qty 1

## 2014-05-16 MED ORDER — CETYLPYRIDINIUM CHLORIDE 0.05 % MT LIQD
7.0000 mL | Freq: Two times a day (BID) | OROMUCOSAL | Status: DC
  Administered 2014-05-16 – 2014-05-20 (×7): 7 mL via OROMUCOSAL

## 2014-05-16 MED ORDER — CHLORHEXIDINE GLUCONATE 0.12 % MT SOLN
15.0000 mL | Freq: Two times a day (BID) | OROMUCOSAL | Status: DC
  Administered 2014-05-16 – 2014-05-20 (×6): 15 mL via OROMUCOSAL
  Filled 2014-05-16 (×9): qty 15

## 2014-05-16 MED ORDER — ZIPRASIDONE HCL 60 MG PO CAPS
60.0000 mg | ORAL_CAPSULE | Freq: Every day | ORAL | Status: DC
  Administered 2014-05-16 – 2014-05-19 (×4): 60 mg via ORAL
  Filled 2014-05-16 (×6): qty 1

## 2014-05-16 MED ORDER — NEPRO/CARBSTEADY PO LIQD
1000.0000 mL | ORAL | Status: DC
  Filled 2014-05-16 (×2): qty 1000

## 2014-05-16 NOTE — Progress Notes (Signed)
[facility] ADULT ICU REPLACEMENT PROTOCOL FOR AM LAB REPLACEMENT ONLY  The patient does not apply for the [facility] Adult ICU Electrolyte Replacment Protocol based on the criteria listed below:    Is GFR >/= 40 ml/min? No.  Patient's GFR today is 10 . Abnormal electrolyte(s): K3.7   If a panic level lab has been reported, has the CCM MD in charge been notified? Yes.  .   Physician:  Jolee Ewing  Vear Clock 05/16/2014 5:58 AM

## 2014-05-16 NOTE — Progress Notes (Signed)
Patient ID: Jennifer Lawson, female   DOB: [birth year], 54 y.o.   MRN: [id number]  [facility] Progress Note   Assessment/ Plan:   1. End-stage renal disease from progressive chronic kidney disease: Hemodialysis started on Wednesday for management of multiple metabolic abnormalities and possible uremic encephalopathy. 3rd treatment scheduled for today with a break over the weekend unless acutely needed for volume/electrolyte abnormalities.  2. Metabolic encephalopathy: Suspected to be primarily from uremic encephalopathy and with progressive metabolic acidosis-hemodialysis started, monitor would like consideration. 3. Anion gap metabolic acidosis: Improved with hemodialysis-continue to monitor. 4. Anemia of chronic disease: Start intravenous iron for iron deficiency and ESA. 5. Metabolic bone disease: Phosphorus levels acceptable-currently not on binders (nothing by mouth).  Subjective:   Acute events overnight noted-primarily due to patient restlessness and unable to wean    Objective:   BP 113/55 mmHg  Pulse 87  Temp(Src) 97.9 F (36.6 C) (Oral)  Resp 12  Ht 5\' 2"  (1.575 m)  Wt 89.9 kg (198 lb 3.1 oz)  BMI 36.24 kg/m2  SpO2 99%  LMP 05/09/2008  Physical Exam: Gen: Intubated, sedated CVS: Pulse regular in rate and rhythm Resp: Coarse breath sounds bilaterally Abd: Soft, obese, nontender Ext: 1+ lower extremity edema  Labs: BMET  Recent Labs Lab 05/13/14 1722 05/13/14 1735 05/14/14 0700 05/14/14 2120 05/15/14 0443 05/15/14 1700 05/16/14 0425  NA 142 141 144 142 142 142 143  K 5.1 5.1 4.5 3.3* 3.1* 3.2* 3.7  CL 110 114* 117* 107 108 106 108  CO2 19  --  16* 24 26 28 30   GLUCOSE 77 72 120* 99 97 78 118*  BUN 60* 58* 64* 40* 42* 33* 35*  CREATININE 4.80* 4.40* 5.12* 4.01* 4.28* 4.05* 4.40*  CALCIUM 9.0  --  9.0 9.3 9.4 9.6 9.8  PHOS  --   --   --   --   --  4.7*  --    CBC  Recent Labs Lab 05/13/14 1722 05/13/14 1735 05/14/14 0700  05/15/14 0443 05/16/14 0425  WBC 4.3  --  4.4 5.1 6.2  NEUTROABS 2.9  --  3.2  --   --   HGB 9.4* 10.9* 9.2* 8.2* 7.8*  HCT 30.7* 32.0* 29.9* 26.8* 26.4*  MCV 97.2  --  100.7* 94.4 98.1  PLT 165  --  146* 130* 120*   Medications:    . antiseptic oral rinse  7 mL Mouth Rinse QID  . chlorhexidine  15 mL Mouth Rinse BID  . darbepoetin (ARANESP) injection - DIALYSIS  60 mcg Intravenous Q Thu-HD  . feeding supplement (PRO-STAT SUGAR FREE 64)  30 mL Per Tube BID  . feeding supplement (VITAL HIGH PROTEIN)  1,000 mL Per Tube Q24H  . ferric gluconate (FERRLECIT/NULECIT) IV  250 mg Intravenous Q T,Th,S,Su  . heparin  5,000 Units Subcutaneous 3 times per day  . ipratropium-albuterol  3 mL Nebulization Q6H  . pantoprazole (PROTONIX) IV  40 mg Intravenous Q24H  . sodium chloride  3 mL Intravenous Q12H  . thiamine IV  100 mg Intravenous Daily  . valproate sodium  500 mg Intravenous Q12H   Elmarie Shiley, MD 05/16/2014, 7:58 AM

## 2014-05-16 NOTE — Progress Notes (Addendum)
PULMONARY / CRITICAL CARE MEDICINE   Name: Jennifer Lawson MRN: [id number] DOB: August 21, 1960    ADMISSION DATE:  05/13/2014 CONSULTATION DATE:  05/14/14  REFERRING MD :  Dr. Tyrell Antonio   CHIEF COMPLAINT:  AMS  INITIAL PRESENTATION:   54yo female DM II, hx seizure, COPD, heavy smoker with extensive psych hx, CKD V (fistula placed 06/2013), admitted 1/6 with AMS, metabolic acidosis.  PCCM consulted for increasing lethargy, electrolyte abnormalities, acidosis.     STUDIES:  1/5  CT Head >> no acute abnormality 1/6  MRI Head >> no acute process  1/6 EEG >>> 1/7 cxr : cardiomegaly, mild pulm venous congestion, LLL atelectasis vs infiltrate. 1/8 cxr: Vascular congestion of CXR improved   SIGNIFICANT EVENTS: 1/6 ETT placed, HD for first time. 1/7- HD attempted but couldn't complete  SUBJECTIVE:unable to obtain, remains on vent 40%, 5 PEEP, PRVC, RR 12. no events overnight.  VITAL SIGNS: Temp:  [97.9 F (36.6 C)-99.5 F (37.5 C)] 97.9 F (36.6 C) (01/08 0421) Pulse Rate:  [73-106] 87 (01/08 0400) Resp:  [12-23] 12 (01/08 0400) BP: (93-134)/(43-81) 99/55 mmHg (01/08 0400) SpO2:  [96 %-100 %] 98 % (01/08 0400) FiO2 (%):  [40 %] 40 % (01/08 0400) Weight:  [89.9 kg (198 lb 3.1 oz)] 89.9 kg (198 lb 3.1 oz) (01/07 0955) HEMODYNAMICS: CVP:  [12 mmHg] 12 mmHg VENTILATOR SETTINGS: Vent Mode:  [-] PRVC FiO2 (%):  [40 %] 40 % Set Rate:  [12 bmp-22 bmp] 12 bmp Vt Set:  [400 mL] 400 mL PEEP:  [5 cmH20] 5 cmH20 Pressure Support:  [5 cmH20-10 cmH20] 10 cmH20 Plateau Pressure:  [12 cmH20-13 cmH20] 13 cmH20 INTAKE / OUTPUT:  Intake/Output Summary (Last 24 hours) at 05/16/14 1751 Last data filed at 05/16/14 0400  Gross per 24 hour  Intake 1154.25 ml  Output   1103 ml  Net  51.25 ml    PHYSICAL EXAMINATION: General:  Chronically ill appearing female, NAD  Neuro: awake but not following commands.  HEENT:  no JVD  Cardiovascular:  s1s2 rrr Lungs:  Intubated. Coarse. Abdomen:  Soft, non  tender, obese.  Musculoskeletal:  Warm and dry,1+ edema BLE, LLE posterior large hematoma   LABS: CBC  Recent Labs Lab 05/14/14 0700 05/15/14 0443 05/16/14 0425  WBC 4.4 5.1 6.2  HGB 9.2* 8.2* 7.8*  HCT 29.9* 26.8* 26.4*  PLT 146* 130* 120*   Coag's  Recent Labs Lab 05/13/14 1722  APTT 38*  INR 1.15   BMET  Recent Labs Lab 05/15/14 0443 05/15/14 1700 05/16/14 0425  NA 142 142 143  K 3.1* 3.2* 3.7  CL 108 106 108  CO2 26 28 30   BUN 42* 33* 35*  CREATININE 4.28* 4.05* 4.40*  GLUCOSE 97 78 118*   Electrolytes  Recent Labs Lab 05/15/14 0443 05/15/14 1700 05/16/14 0425  CALCIUM 9.4 9.6 9.8  MG  --  2.4  --   PHOS  --  4.7*  --    Sepsis Markers  Recent Labs Lab 05/14/14 1200 05/14/14 2120 05/15/14 0443 05/16/14 0425  LATICACIDVEN  --  1.1  --   --   PROCALCITON <0.10  --  <0.10 1.98   ABG  Recent Labs Lab 05/14/14 1250 05/14/14 2135 05/15/14 0453  PHART 7.115* 7.452* 7.541*  PCO2ART 64.8* 35.9 31.3*  PO2ART 341.0* 43.1* 87.5   Liver Enzymes  Recent Labs Lab 05/13/14 1722 05/14/14 0700 05/15/14 0443 05/15/14 1700  AST 13 11 12   --   ALT 11 9 8   --  ALKPHOS 55 53 45  --   BILITOT 1.1 1.1 0.6  --   ALBUMIN 3.4* 2.8* 2.5* 2.3*   Cardiac Enzymes No results for input(s): TROPONINI, PROBNP in the last 168 hours. Glucose  Recent Labs Lab 05/15/14 0829 05/15/14 1140 05/15/14 1615 05/15/14 2002 05/15/14 2341 05/16/14 0420  GLUCAP 91 84 73 75 94 110*    Imaging Dg Chest Port 1 View  05/15/2014   CLINICAL DATA:  Acute respiratory failure.  Hypoxia.  EXAM: PORTABLE CHEST - 1 VIEW  COMPARISON:  05/14/2014.  FINDINGS: Endotracheal tube tip 2.8 cm above the carina. Left IJ line in stable position. Stable cardiomegaly. Mild pulmonary venous congestion noted on today's exam. Left lower lobe atelectasis and/or infiltrate, progressive from prior exam. Small left pleural effusion cannot be excluded. No pneumothorax. Surgical clips right  axilla. No acute bony abnormality.  IMPRESSION: 1. Endotracheal tube tip 2.8 cm above the carina. Left IJ line in stable position. 2. Cardiomegaly with mild pulmonary venous congestion. 3. Progressive left lower lobe atelectasis and/or infiltrate. Small left pleural effusion cannot be excluded.   Electronically Signed   By: Marcello Moores  Register   On: 05/15/2014 07:39   Dg Knee Left Port  05/15/2014   CLINICAL DATA:  Recent fall in bathroom, patellar bruising and pain, initial encounter  EXAM: PORTABLE LEFT KNEE - 1-2 VIEW  COMPARISON:  None.  FINDINGS: Degenerative changes of the left knee joint are noted particularly in the medial joint space. No acute fracture is seen. A small joint effusion is noted.  IMPRESSION: Small joint effusion.  Degenerative changes.   Electronically Signed   By: Inez Catalina M.D.   On: 05/15/2014 15:01   Dg Tibia/fibula Left Port  05/15/2014   CLINICAL DATA:  Recent fall in bathroom with leg pain and hematoma, initial encounter  EXAM: PORTABLE LEFT TIBIA AND FIBULA - 2 VIEW  COMPARISON:  None.  FINDINGS: No acute fracture or dislocation is noted. No gross soft tissue abnormality is seen.  IMPRESSION: No acute abnormality noted.   Electronically Signed   By: Inez Catalina M.D.   On: 05/15/2014 15:00   Dg Femur Port 1v Left  05/15/2014   CLINICAL DATA:  Recent fall in bathroom with leg pain  EXAM: DG FEMUR 1V PORT*L*  COMPARISON:  None.  FINDINGS: There is no evidence of fracture or other focal bone lesions. Soft tissues are unremarkable.  IMPRESSION: No acute abnormality is noted on this limited exam.   Electronically Signed   By: Inez Catalina M.D.   On: 05/15/2014 15:02     ASSESSMENT / PLAN:  PULMONARY A: Acute hypercarbic resp failure with chronic COPD hx - likely 2/2 to AMS. - intubated 1/6 - resolved Tobacco Abuse COPD -  Mixed Acidosis - improved with bicarb drip and after intubation. alkalosis now  Vascular congestion of CXR improved P:   SBT planned , cpap5 ps 5,  goal tv 5 cc/kg met, extubation planned, neuro improved Off fentanyl. Keep sedatives at minimum.  Neg balance with hd IS once extubated  CARDIOVASCULAR Hx Hypertension but now hypotensive.  Volume overload  Last echo no CHF.  P:  Retry HD today to maintain negative balance. Difficulty with BP being low.  Hold antihypertensives and lasix for now.   RENAL A:   ESRD from progression of CKD IV- Baseline Scr ~4.0.  RUE fistula placed 06/2013. Anion gap Metabolic Acidosis - resolved with bicarb drip. HypoK+  P:   Retry HD today, nephro following.  Replete K+ during HD unless severely low.  Goal neg  GASTROINTESTINAL A:   Severe Protein Calorie Malnutrition ETOH  P:   Cont tube feeds. Try swallow eval when extubated. Held for weaning Will NOT need slp Thiamine/folate.  HEMATOLOGIC A:   Acute on chronic normocytic Anemia, likely 2/2 to CKD, not sure about cause of acute worsening L Calf Hematoma - xrays negative for fx.  P:  BLE dopplers negative for DVT.  Monitor CBC and hematoma.  tx <7 hgb. Xray leg  INFECTIOUS No clear sources of infection P:   UA negative.  BCx2 1/6 >>  UC 1/6 >>   Vancomycin 1/6>>>1/6 Cefepime 1/6>>>1/6 Ceftriaxone 1/6>>>  1/7.  No evidence HCAP on CXR, no leukocytosis. PCT mild elevated but could be from CKD .UA normal. All cultures negative.  Cont to observe without abx.   ENDOCRINE A:   Hypoglycemia - likely 2/2 to no PO intake. No DM. hgba1c normal.  P:   q4hr CBG.    NEUROLOGIC A:   Acute Encephalopathy - unclear etiology.  But likely 2/2 to uremia.   CT, MRI head ok.  Lactic acid normal, depakote 41 (therapeutic). Ammonia normal.  TSH nl. uds negative.  Bipolar Depression - with hx of overdose in past PTSD P:   EEG completed shows generalized irregular slowing consistent with a mild-moderate encephalopathy.  Monitor resp status Expecting improvement with HD.  Cont depakote, level assessed Reduce sedatives as much as  possible for [employer] other psych meds: geodon, restoril, sominex (for sleep). Consider Geodon start  FAMILY  - Updates: daughter updated at length at bedside 1/6. No family today.  - Inter-disciplinary family meet or Palliative Care meeting due by:  1/13  Dellia Nims, M.D PGY-1 Internal Medicine Pager [phone]  STAFF NOTE: I, Merrie Roof, MD FACP have personally reviewed patient's available data, including medical history, events of note, physical examination and test results as part of my evaluation. I have discussed with resident/NP and other care providers such as pharmacist, RN and RRT. In addition, I personally evaluated patient and elicited key findings of: weaning well, for hd again, assessed--> extubate now, no slp needed, less coarse on examination, HD again today, start geodon, dc line when able The patient is critically ill with multiple organ systems failure and requires high complexity decision making for assessment and support, frequent evaluation and titration of therapies, application of advanced monitoring technologies and extensive interpretation of multiple databases.   Critical Care Time devoted to patient care services described in this note is30 Minutes. This time reflects time of care of this signee: Merrie Roof, MD FACP. This critical care time does not reflect procedure time, or teaching time or supervisory time of PA/NP/Med student/Med Resident etc but could involve care discussion time. Rest per NP/medical resident whose note is outlined above and that I agree with   Lavon Paganini. Titus Mould, MD, [place] [facility] Pgr: [facility] 05/16/2014 10:59 AM

## 2014-05-16 NOTE — Procedures (Signed)
Extubation Procedure Note  Patient Details:   Name: Jennifer Lawson DOB: [birth year] MRN: [id number]   Airway Documentation:     Evaluation  O2 sats: stable throughout Complications: No apparent complications Patient did tolerate procedure well. Bilateral Breath Sounds: Rhonchi, Diminished Suctioning: Airway Yes   Order received for extubation.  VC 1.0l prior to extubation.  Cuff leak positive prior to extubation.  Extubated to 4l [place] without complications.  No stridor noted.  Patient able to vocalize post procedure.  Will continue to monitor.  Phillis Knack [facility] 05/16/2014, 10:21 AM

## 2014-05-16 NOTE — Care Management Note (Signed)
  Page 1 of 1   05/16/2014     11:19:27 AM CARE MANAGEMENT NOTE 05/16/2014  Patient:  Jennifer Lawson, Jennifer Lawson   Account Number:  [id number]  Date Initiated:  05/14/2014  Documentation initiated by:  [facility]  Subjective/Objective Assessment:   Admitted with AMS -  deteriorated to renal and resp failure requiring intubation and tx to ICU.     Action/Plan:   Anticipated DC Date:  05/21/2014   Anticipated DC Plan:  LONG TERM ACUTE CARE (LTAC)      DC Planning Services  CM consult      Choice offered to / List presented to:             Status of service:  In process, will continue to follow Medicare Important Message given?  YES (If response is "NO", the following Medicare IM given date fields will be blank) Date Medicare IM given:  05/16/2014 Medicare IM given by:  Elissa Hefty Date Additional Medicare IM given:   Additional Medicare IM given by:    Discharge Disposition:    Per UR Regulation:  Reviewed for med. necessity/level of care/duration of stay  If discussed at [place] of Stay Meetings, dates discussed:    Comments:  ContactBerlie, [place] Daughter [phone]

## 2014-05-16 NOTE — Progress Notes (Signed)
Subjective: No overnight events. Remains intubated. Unable to complete full course of HD.   EEG completed shows generalized irregular slowing consistent with a mild-moderate encephalopathy.   Objective: Current vital signs: BP 113/55 mmHg  Pulse 87  Temp(Src) 97.9 F (36.6 C) (Oral)  Resp 12  Ht 5\' 2"  (1.575 m)  Wt 89.9 kg (198 lb 3.1 oz)  BMI 36.24 kg/m2  SpO2 99%  LMP 05/09/2008 Vital signs in last 24 hours: Temp:  [97.9 F (36.6 C)-99.5 F (37.5 C)] 97.9 F (36.6 C) (01/08 0421) Pulse Rate:  [73-106] 87 (01/08 0700) Resp:  [12-23] 12 (01/08 0700) BP: (93-134)/(43-81) 113/55 mmHg (01/08 0700) SpO2:  [96 %-100 %] 99 % (01/08 0700) FiO2 (%):  [40 %] 40 % (01/08 0400) Weight:  [89.9 kg (198 lb 3.1 oz)] 89.9 kg (198 lb 3.1 oz) (01/07 0955)  Intake/Output from previous day: 01/07 0701 - 01/08 0700 In: 1139.3 [I.V.:613; NG/GT:301.3; IV Piggyback:225] Out: 1203 [Urine:880; Emesis/NG output:300] Intake/Output this shift:   Nutritional status: Diet NPO time specified  Neurologic Exam: Mental Status: Lethargic, opens eyes to voice but quickly closes if not constantly stimulate. Intermittently follows simple commands.  Cranial Nerves: II:  pupils equal, round, reactive to light III,IV, VI: ptosis not present, eyes midline, does not track but will moves eyes to right and left, no noted gaze preference V,VII: face grossly symmetric Motor: Not following commands so unable to formally test but appears to move all extremities though moves L>R Sensory: Pinprick intact throughout but withdrawals more briskly on left Deep Tendon Reflexes: 1+ bilateral UE, absent patellar and achilles (marked edema) Plantars: Right: downgoingLeft: downgoing Lab Results: Basic Metabolic Panel:  Recent Labs Lab 05/14/14 0700 05/14/14 2120 05/15/14 0443 05/15/14 1700 05/16/14 0425  NA 144 142 142 142 143  K 4.5 3.3* 3.1* 3.2* 3.7  CL 117* 107 108 106 108  CO2  16* 24 26 28 30   GLUCOSE 120* 99 97 78 118*  BUN 64* 40* 42* 33* 35*  CREATININE 5.12* 4.01* 4.28* 4.05* 4.40*  CALCIUM 9.0 9.3 9.4 9.6 9.8  MG  --   --   --  2.4  --   PHOS  --   --   --  4.7*  --     Liver Function Tests:  Recent Labs Lab 05/13/14 1722 05/14/14 0700 05/15/14 0443 05/15/14 1700  AST 13 11 12   --   ALT 11 9 8   --   ALKPHOS 55 53 45  --   BILITOT 1.1 1.1 0.6  --   PROT 6.9 6.0 5.4*  --   ALBUMIN 3.4* 2.8* 2.5* 2.3*    Recent Labs Lab 05/13/14 1810  LIPASE 25    Recent Labs Lab 05/13/14 1808 05/14/14 2200  AMMONIA 25 11    CBC:  Recent Labs Lab 05/13/14 1722 05/13/14 1735 05/14/14 0700 05/15/14 0443 05/16/14 0425  WBC 4.3  --  4.4 5.1 6.2  NEUTROABS 2.9  --  3.2  --   --   HGB 9.4* 10.9* 9.2* 8.2* 7.8*  HCT 30.7* 32.0* 29.9* 26.8* 26.4*  MCV 97.2  --  100.7* 94.4 98.1  PLT 165  --  146* 130* 120*    Cardiac Enzymes: No results for input(s): CKTOTAL, CKMB, CKMBINDEX, TROPONINI in the last 168 hours.  Lipid Panel:  Recent Labs Lab 05/14/14 0700  CHOL 179  TRIG 190*  HDL 53  CHOLHDL 3.4  VLDL 38  LDLCALC 88    CBG:  Recent Labs Lab  05/15/14 1140 05/15/14 1615 05/15/14 2002 05/15/14 2341 05/16/14 0420  GLUCAP 84 73 75 94 110*    Microbiology: Results for orders placed or performed during the hospital encounter of 05/13/14  MRSA PCR Screening     Status: None   Collection Time: 05/13/14 11:29 PM  Result Value Ref Range Status   MRSA by PCR NEGATIVE NEGATIVE Final    Comment:        The GeneXpert MRSA Assay (FDA approved for NASAL specimens only), is one component of a comprehensive MRSA colonization surveillance program. It is not intended to diagnose MRSA infection nor to guide or monitor treatment for MRSA infections.   Culture, blood (routine x 2)     Status: None (Preliminary result)   Collection Time: 05/14/14  5:40 AM  Result Value Ref Range Status   Specimen Description BLOOD LEFT HAND  Final    Special Requests BOTTLES DRAWN AEROBIC AND ANAEROBIC 10CC EACH  Final   Culture   Final           BLOOD CULTURE RECEIVED NO GROWTH TO DATE CULTURE WILL BE HELD FOR 5 DAYS BEFORE ISSUING A FINAL NEGATIVE REPORT Performed at [facility]    Report Status PENDING  Incomplete  Culture, blood (routine x 2)     Status: None (Preliminary result)   Collection Time: 05/14/14  7:00 AM  Result Value Ref Range Status   Specimen Description BLOOD LEFT ARM  Final   Special Requests BOTTLES DRAWN AEROBIC ONLY 6CC  Final   Culture   Final           BLOOD CULTURE RECEIVED NO GROWTH TO DATE CULTURE WILL BE HELD FOR 5 DAYS BEFORE ISSUING A FINAL NEGATIVE REPORT Performed at [facility]    Report Status PENDING  Incomplete  Urine culture     Status: None   Collection Time: 05/14/14  8:30 AM  Result Value Ref Range Status   Specimen Description URINE, CATHETERIZED  Final   Special Requests NONE  Final   Colony Count NO GROWTH Performed at [facility]   Final   Culture NO GROWTH Performed at [facility]   Final   Report Status 05/15/2014 FINAL  Final    Coagulation Studies:  Recent Labs  05/13/14 1722  LABPROT 14.8  INR 1.15    Imaging: Dg Chest Port 1 View  05/16/2014   CLINICAL DATA:  Intubation.  EXAM: PORTABLE CHEST - 1 VIEW  COMPARISON:  05/15/2014.  FINDINGS: Endotracheal tube 11 mm above the carina, proximal repositioning suggested. Left IJ line and NG tube in stable position. Persistent left lower lobe atelectasis and/or infiltrate. Tiny left pleural effusion cannot be excluded. No interim change. No pneumothorax. Stable cardiomegaly. No pulmonary venous congestion on today's exam. No acute bony abnormality.  IMPRESSION: 1. Endotracheal tube 11 mm above the carina, proximal repositioning suggested. Remaining lines and tubes in stable position. 2. Persistent left lower lobe atelectasis and/or infiltrate. Tiny left pleural effusion cannot be excluded. No  interval change. 3. Cardiomegaly.  Interim resolution of pulmonary venous congestion.   Electronically Signed   By: Marcello Moores  Register   On: 05/16/2014 07:42   Dg Chest Port 1 View  05/15/2014   CLINICAL DATA:  Acute respiratory failure.  Hypoxia.  EXAM: PORTABLE CHEST - 1 VIEW  COMPARISON:  05/14/2014.  FINDINGS: Endotracheal tube tip 2.8 cm above the carina. Left IJ line in stable position. Stable cardiomegaly. Mild pulmonary venous congestion noted on today's exam.  Left lower lobe atelectasis and/or infiltrate, progressive from prior exam. Small left pleural effusion cannot be excluded. No pneumothorax. Surgical clips right axilla. No acute bony abnormality.  IMPRESSION: 1. Endotracheal tube tip 2.8 cm above the carina. Left IJ line in stable position. 2. Cardiomegaly with mild pulmonary venous congestion. 3. Progressive left lower lobe atelectasis and/or infiltrate. Small left pleural effusion cannot be excluded.   Electronically Signed   By: Marcello Moores  Register   On: 05/15/2014 07:39   Dg Chest Portable 1 View  05/14/2014   CLINICAL DATA:  Acute respiratory failure. Intubation and central line placement.  EXAM: PORTABLE CHEST - 1 VIEW  COMPARISON:  05/14/2014  FINDINGS: Endotracheal tube tip is approximately 1 cm above the carina. Nasogastric tube is seen entering the stomach. A new left internal jugular central venous catheter is seen with tip overlying the distal SVC. No pneumothorax visualized.  Mild linear opacity in the left retrocardiac lung back is this consistent with mild subsegmental atelectasis. No evidence of pulmonary consolidation or pleural effusion.  IMPRESSION: Endotracheal tube tip approximately 1 cm above carina.  Left jugular central venous catheter and nasogastric tube in appropriate position. No pneumothorax visualized.  Mild left basilar atelectasis.   Electronically Signed   By: Earle Gell M.D.   On: 05/14/2014 13:11   Dg Knee Left Port  05/15/2014   CLINICAL DATA:  Recent fall in  bathroom, patellar bruising and pain, initial encounter  EXAM: PORTABLE LEFT KNEE - 1-2 VIEW  COMPARISON:  None.  FINDINGS: Degenerative changes of the left knee joint are noted particularly in the medial joint space. No acute fracture is seen. A small joint effusion is noted.  IMPRESSION: Small joint effusion.  Degenerative changes.   Electronically Signed   By: Inez Catalina M.D.   On: 05/15/2014 15:01   Dg Tibia/fibula Left Port  05/15/2014   CLINICAL DATA:  Recent fall in bathroom with leg pain and hematoma, initial encounter  EXAM: PORTABLE LEFT TIBIA AND FIBULA - 2 VIEW  COMPARISON:  None.  FINDINGS: No acute fracture or dislocation is noted. No gross soft tissue abnormality is seen.  IMPRESSION: No acute abnormality noted.   Electronically Signed   By: Inez Catalina M.D.   On: 05/15/2014 15:00   Dg Femur Port 1v Left  05/15/2014   CLINICAL DATA:  Recent fall in bathroom with leg pain  EXAM: DG FEMUR 1V PORT*L*  COMPARISON:  None.  FINDINGS: There is no evidence of fracture or other focal bone lesions. Soft tissues are unremarkable.  IMPRESSION: No acute abnormality is noted on this limited exam.   Electronically Signed   By: Inez Catalina M.D.   On: 05/15/2014 15:02    Medications:  Scheduled: . antiseptic oral rinse  7 mL Mouth Rinse QID  . chlorhexidine  15 mL Mouth Rinse BID  . darbepoetin (ARANESP) injection - DIALYSIS  60 mcg Intravenous Q Thu-HD  . feeding supplement (PRO-STAT SUGAR FREE 64)  30 mL Per Tube BID  . feeding supplement (VITAL HIGH PROTEIN)  1,000 mL Per Tube Q24H  . ferric gluconate (FERRLECIT/NULECIT) IV  250 mg Intravenous Q T,Th,S,Su  . heparin  5,000 Units Subcutaneous 3 times per day  . ipratropium-albuterol  3 mL Nebulization Q6H  . pantoprazole (PROTONIX) IV  40 mg Intravenous Q24H  . sodium chloride  3 mL Intravenous Q12H  . thiamine IV  100 mg Intravenous Daily  . valproate sodium  500 mg Intravenous Q12H    Assessment/Plan: 53  y/o women with history of renal  failure, bipolar disorder presenting for evaluation of altered mental status. Symptoms have been getting progressively worse for past 2-3 days. Encephalopathy is likely multifactorial and related to metabolic, renal etiology with polypharmacy playing a role. Nephrology following patient.  MRI brain shows no acute infarct.   -vent, metabolic and renal management per CCM and nephrology -VPA level 41. Would continue current dose and schedule. -limit sedating medications as tolerated -Will follow as needed.    LOS: 3 days   Jim Like, DO [facility] [phone]  If 7pm- 7am, please page neurology on call as listed in AMION. 05/16/2014  7:51 AM

## 2014-05-16 NOTE — Progress Notes (Signed)
Wasted approximately 173mL of Fentanyl gtt in sink witnessed by Karrie Meres, RN.  Olevia Perches, RN

## 2014-05-17 LAB — BASIC METABOLIC PANEL
Anion gap: 7 (ref 5–15)
BUN: 20 mg/dL (ref 6–23)
CO2: 30 mmol/L (ref 19–32)
Calcium: 9.5 mg/dL (ref 8.4–10.5)
Chloride: 102 mEq/L (ref 96–112)
Creatinine, Ser: 3.5 mg/dL — ABNORMAL HIGH (ref 0.50–1.10)
GFR calc Af Amer: 16 mL/min — ABNORMAL LOW (ref >90)
GFR calc non Af Amer: 14 mL/min — ABNORMAL LOW (ref >90)
Glucose, Bld: 74 mg/dL (ref 70–99)
Potassium: 4 mmol/L (ref 3.5–5.1)
Sodium: 139 mmol/L (ref 135–145)

## 2014-05-17 LAB — CBC
HCT: 28.3 % — ABNORMAL LOW (ref 36.0–46.0)
Hemoglobin: 8.6 g/dL — ABNORMAL LOW (ref 12.0–15.0)
MCH: 30.8 pg (ref 26.0–34.0)
MCHC: 30.4 g/dL (ref 30.0–36.0)
MCV: 101.4 fL — ABNORMAL HIGH (ref 78.0–100.0)
Platelets: 98 10*3/uL — ABNORMAL LOW (ref 150–400)
RBC: 2.79 MIL/uL — ABNORMAL LOW (ref 3.87–5.11)
RDW: 16.2 % — ABNORMAL HIGH (ref 11.5–15.5)
WBC: 4.6 10*3/uL (ref 4.0–10.5)

## 2014-05-17 LAB — GLUCOSE, CAPILLARY: Glucose-Capillary: 94 mg/dL (ref 70–99)

## 2014-05-17 MED ORDER — BISACODYL 5 MG PO TBEC
5.0000 mg | DELAYED_RELEASE_TABLET | Freq: Every day | ORAL | Status: DC | PRN
  Administered 2014-05-18: 5 mg via ORAL
  Filled 2014-05-17: qty 1

## 2014-05-17 MED ORDER — DOCUSATE SODIUM 100 MG PO CAPS
100.0000 mg | ORAL_CAPSULE | Freq: Two times a day (BID) | ORAL | Status: DC
  Administered 2014-05-18 – 2014-05-20 (×6): 100 mg via ORAL
  Filled 2014-05-17 (×7): qty 1

## 2014-05-17 MED ORDER — NICOTINE 21 MG/24HR TD PT24
21.0000 mg | MEDICATED_PATCH | Freq: Every day | TRANSDERMAL | Status: DC
  Administered 2014-05-17 – 2014-05-20 (×4): 21 mg via TRANSDERMAL
  Filled 2014-05-17 (×4): qty 1

## 2014-05-17 NOTE — Progress Notes (Signed)
Pt c/o SOB. Upon assessment pt has regular and unlabored breathing. Bilateral lung sounds rhonchi and diminished. O2 sats 98% on room air. Paged to on-call RRT and asked for breathing treatment. Will cont to monitor.

## 2014-05-17 NOTE — Progress Notes (Signed)
Subjective: No overnight events. Extubated yesterday. Patient currently resting comfortably. Questions "where breakfast is"  Objective: Current vital signs: BP 112/68 mmHg  Pulse 91  Temp(Src) 99.5 F (37.5 C) (Oral)  Resp 18  Ht 5\' 2"  (1.575 m)  Wt 88.8 kg (195 lb 12.3 oz)  BMI 35.80 kg/m2  SpO2 100%  LMP 05/09/2008 Vital signs in last 24 hours: Temp:  [98.6 F (37 C)-99.5 F (37.5 C)] 99.5 F (37.5 C) (01/09 0430) Pulse Rate:  [83-106] 91 (01/09 0600) Resp:  [11-26] 18 (01/09 0600) BP: (87-139)/(34-81) 112/68 mmHg (01/09 0600) SpO2:  [92 %-100 %] 100 % (01/09 0600) FiO2 (%):  [40 %] 40 % (01/08 0807) Weight:  [88.8 kg (195 lb 12.3 oz)] 88.8 kg (195 lb 12.3 oz) (01/08 1324)  Intake/Output from previous day: 01/08 0701 - 01/09 0700 In: 683.4 [P.O.:360; I.V.:113; NG/GT:100.4; IV Piggyback:110] Out: 2878 [Urine:1485] Intake/Output this shift:   Nutritional status: Diet clear liquid  Neurologic Exam: Mental Status: Lethargic, opens eyes to voice but quickly closes if not constantly stimulate. Oriented to name, "[place]". Slow dysarthric speech Cranial Nerves: II:  pupils equal, round, reactive to light III,IV, VI: ptosis not present, eyes midline, does not track but will moves eyes to right and left, no noted gaze preference V,VII: face grossly symmetric Motor: Not following commands so unable to formally test but appears to move all extremities though moves L>R Sensory: Pinprick intact throughout but withdrawals more briskly on left Deep Tendon Reflexes: 1+ bilateral UE, absent patellar and achilles (marked edema) Plantars: Right: downgoingLeft: downgoing Lab Results: Basic Metabolic Panel:  Recent Labs Lab 05/14/14 2120 05/15/14 0443 05/15/14 1700 05/16/14 0425 05/17/14 0600  NA 142 142 142 143 139  K 3.3* 3.1* 3.2* 3.7 4.0  CL 107 108 106 108 102  CO2 24 26 28 30 30   GLUCOSE 99 97 78 118* 74  BUN 40* 42* 33* 35* 20   CREATININE 4.01* 4.28* 4.05* 4.40* 3.50*  CALCIUM 9.3 9.4 9.6 9.8 9.5  MG  --   --  2.4  --   --   PHOS  --   --  4.7*  --   --     Liver Function Tests:  Recent Labs Lab 05/13/14 1722 05/14/14 0700 05/15/14 0443 05/15/14 1700  AST 13 11 12   --   ALT 11 9 8   --   ALKPHOS 55 53 45  --   BILITOT 1.1 1.1 0.6  --   PROT 6.9 6.0 5.4*  --   ALBUMIN 3.4* 2.8* 2.5* 2.3*    Recent Labs Lab 05/13/14 1810  LIPASE 25    Recent Labs Lab 05/13/14 1808 05/14/14 2200  AMMONIA 25 11    CBC:  Recent Labs Lab 05/13/14 1722 05/13/14 1735 05/14/14 0700 05/15/14 0443 05/16/14 0425 05/17/14 0600  WBC 4.3  --  4.4 5.1 6.2 4.6  NEUTROABS 2.9  --  3.2  --   --   --   HGB 9.4* 10.9* 9.2* 8.2* 7.8* 8.6*  HCT 30.7* 32.0* 29.9* 26.8* 26.4* 28.3*  MCV 97.2  --  100.7* 94.4 98.1 101.4*  PLT 165  --  146* 130* 120* PENDING    Cardiac Enzymes: No results for input(s): CKTOTAL, CKMB, CKMBINDEX, TROPONINI in the last 168 hours.  Lipid Panel:  Recent Labs Lab 05/14/14 0700  CHOL 179  TRIG 190*  HDL 53  CHOLHDL 3.4  VLDL 38  LDLCALC 88    CBG:  Recent Labs Lab 05/16/14 0420  05/16/14 0832 05/16/14 1150 05/16/14 1605 05/16/14 2002  GLUCAP 110* 83 84 81 60    Microbiology: Results for orders placed or performed during the hospital encounter of 05/13/14  MRSA PCR Screening     Status: None   Collection Time: 05/13/14 11:29 PM  Result Value Ref Range Status   MRSA by PCR NEGATIVE NEGATIVE Final    Comment:        The GeneXpert MRSA Assay (FDA approved for NASAL specimens only), is one component of a comprehensive MRSA colonization surveillance program. It is not intended to diagnose MRSA infection nor to guide or monitor treatment for MRSA infections.   Culture, blood (routine x 2)     Status: None (Preliminary result)   Collection Time: 05/14/14  5:40 AM  Result Value Ref Range Status   Specimen Description BLOOD LEFT HAND  Final   Special Requests  BOTTLES DRAWN AEROBIC AND ANAEROBIC 10CC EACH  Final   Culture   Final           BLOOD CULTURE RECEIVED NO GROWTH TO DATE CULTURE WILL BE HELD FOR 5 DAYS BEFORE ISSUING A FINAL NEGATIVE REPORT Performed at [facility]    Report Status PENDING  Incomplete  Culture, blood (routine x 2)     Status: None (Preliminary result)   Collection Time: 05/14/14  7:00 AM  Result Value Ref Range Status   Specimen Description BLOOD LEFT ARM  Final   Special Requests BOTTLES DRAWN AEROBIC ONLY 6CC  Final   Culture   Final           BLOOD CULTURE RECEIVED NO GROWTH TO DATE CULTURE WILL BE HELD FOR 5 DAYS BEFORE ISSUING A FINAL NEGATIVE REPORT Performed at [facility]    Report Status PENDING  Incomplete  Urine culture     Status: None   Collection Time: 05/14/14  8:30 AM  Result Value Ref Range Status   Specimen Description URINE, CATHETERIZED  Final   Special Requests NONE  Final   Colony Count NO GROWTH Performed at [facility]   Final   Culture NO GROWTH Performed at [facility]   Final   Report Status 05/15/2014 FINAL  Final    Coagulation Studies: No results for input(s): LABPROT, INR in the last 72 hours.  Imaging: Dg Chest Port 1 View  05/16/2014   CLINICAL DATA:  Intubation.  EXAM: PORTABLE CHEST - 1 VIEW  COMPARISON:  05/15/2014.  FINDINGS: Endotracheal tube 11 mm above the carina, proximal repositioning suggested. Left IJ line and NG tube in stable position. Persistent left lower lobe atelectasis and/or infiltrate. Tiny left pleural effusion cannot be excluded. No interim change. No pneumothorax. Stable cardiomegaly. No pulmonary venous congestion on today's exam. No acute bony abnormality.  IMPRESSION: 1. Endotracheal tube 11 mm above the carina, proximal repositioning suggested. Remaining lines and tubes in stable position. 2. Persistent left lower lobe atelectasis and/or infiltrate. Tiny left pleural effusion cannot be excluded. No interval change. 3.  Cardiomegaly.  Interim resolution of pulmonary venous congestion.   Electronically Signed   By: Marcello Moores  Register   On: 05/16/2014 07:42   Dg Knee Left Port  05/15/2014   CLINICAL DATA:  Recent fall in bathroom, patellar bruising and pain, initial encounter  EXAM: PORTABLE LEFT KNEE - 1-2 VIEW  COMPARISON:  None.  FINDINGS: Degenerative changes of the left knee joint are noted particularly in the medial joint space. No acute fracture is seen. A small joint effusion  is noted.  IMPRESSION: Small joint effusion.  Degenerative changes.   Electronically Signed   By: Inez Catalina M.D.   On: 05/15/2014 15:01   Dg Tibia/fibula Left Port  05/15/2014   CLINICAL DATA:  Recent fall in bathroom with leg pain and hematoma, initial encounter  EXAM: PORTABLE LEFT TIBIA AND FIBULA - 2 VIEW  COMPARISON:  None.  FINDINGS: No acute fracture or dislocation is noted. No gross soft tissue abnormality is seen.  IMPRESSION: No acute abnormality noted.   Electronically Signed   By: Inez Catalina M.D.   On: 05/15/2014 15:00   Dg Femur Port 1v Left  05/15/2014   CLINICAL DATA:  Recent fall in bathroom with leg pain  EXAM: DG FEMUR 1V PORT*L*  COMPARISON:  None.  FINDINGS: There is no evidence of fracture or other focal bone lesions. Soft tissues are unremarkable.  IMPRESSION: No acute abnormality is noted on this limited exam.   Electronically Signed   By: Inez Catalina M.D.   On: 05/15/2014 15:02    Medications:  Scheduled: . antiseptic oral rinse  7 mL Mouth Rinse q12n4p  . chlorhexidine  15 mL Mouth Rinse BID  . darbepoetin (ARANESP) injection - DIALYSIS  60 mcg Intravenous Q Thu-HD  . feeding supplement (NEPRO CARB STEADY)  1,000 mL Oral Q24H  . ferric gluconate (FERRLECIT/NULECIT) IV  250 mg Intravenous Q T,Th,S,Su  . heparin  5,000 Units Subcutaneous 3 times per day  . ipratropium-albuterol  3 mL Nebulization Q6H  . pantoprazole (PROTONIX) IV  40 mg Intravenous Q24H  . sodium chloride  3 mL Intravenous Q12H  . thiamine  IV  100 mg Intravenous Daily  . valproate sodium  500 mg Intravenous Q12H  . ziprasidone  60 mg Oral QHS    Assessment/Plan: 54 y/o women with history of renal failure, bipolar disorder presenting for evaluation of altered mental status. Symptoms have been getting progressively worse for past 2-3 days. Encephalopathy is likely multifactorial and related to metabolic, renal etiology with polypharmacy playing a role. Nephrology following patient.  MRI brain shows no acute infarct.   -metabolic and renal management per CCM and nephrology -VPA level 41. Would continue current dose and schedule. -limit sedating medications as tolerated -Mental status slowly improving. Will follow as needed   LOS: 4 days   Jim Like, DO [facility] [phone]  If 7pm- 7am, please page neurology on call as listed in AMION. 05/17/2014  7:51 AM

## 2014-05-17 NOTE — Progress Notes (Signed)
Pt transferred from 84M via stretcher. VSS. Call bell and phone within reach.

## 2014-05-17 NOTE — Progress Notes (Signed)
Patient ID: Jennifer Lawson, female   DOB: October 27, 1960, 54 y.o.   MRN: [id number]  [facility] Progress Note   Assessment/ Plan:   1. End-stage renal disease from progressive chronic kidney disease: Hemodialysis started on Wednesday for management of multiple metabolic abnormalities and possible uremic encephalopathy. Acute dialysis needs today-next hemodialysis on Monday via her right brachiocephalic fistula.  2. Metabolic encephalopathy: Suspected to be primarily from uremic encephalopathy and with progressive metabolic acidosis-improved with hemodialysis, patient extubated yesterday. 3. Anion gap metabolic acidosis: Improved with hemodialysis-continue to monitor. 4. Anemia of chronic disease: Start intravenous iron for iron deficiency and ESA. 5. Metabolic bone disease: Phosphorus levels acceptable-currently not on binders , will monitor phosphorus levels.  Subjective:   Reports to be feeling better today-happy to be extubated yesterday. Somewhat distraught that she'll be on dialysis chronically.    Objective:   BP 112/68 mmHg  Pulse 91  Temp(Src) 98.8 F (37.1 C) (Oral)  Resp 18  Ht 5\' 2"  (1.575 m)  Wt 88.8 kg (195 lb 12.3 oz)  BMI 35.80 kg/m2  SpO2 100%  LMP 05/09/2008  Physical Exam: Gen: Comfortably resting in bed, eating breakfast CVS: Pulse regular in rate and rhythm Resp: Coarse breath sounds-no distinct rales/rhonchi Abd: Soft, flat, nontender Ext: Trace to 1+ lower extremity edema  Labs: BMET  Recent Labs Lab 05/13/14 1722 05/13/14 1735 05/14/14 0700 05/14/14 2120 05/15/14 0443 05/15/14 1700 05/16/14 0425 05/17/14 0600  NA 142 141 144 142 142 142 143 139  K 5.1 5.1 4.5 3.3* 3.1* 3.2* 3.7 4.0  CL 110 114* 117* 107 108 106 108 102  CO2 19  --  16* 24 26 28 30 30   GLUCOSE 77 72 120* 99 97 78 118* 74  BUN 60* 58* 64* 40* 42* 33* 35* 20  CREATININE 4.80* 4.40* 5.12* 4.01* 4.28* 4.05* 4.40* 3.50*  CALCIUM 9.0  --  9.0 9.3 9.4 9.6 9.8 9.5   PHOS  --   --   --   --   --  4.7*  --   --    CBC  Recent Labs Lab 05/13/14 1722  05/14/14 0700 05/15/14 0443 05/16/14 0425 05/17/14 0600  WBC 4.3  --  4.4 5.1 6.2 4.6  NEUTROABS 2.9  --  3.2  --   --   --   HGB 9.4*  < > 9.2* 8.2* 7.8* 8.6*  HCT 30.7*  < > 29.9* 26.8* 26.4* 28.3*  MCV 97.2  --  100.7* 94.4 98.1 101.4*  PLT 165  --  146* 130* 120* 98*  < > = values in this interval not displayed.  @IMGRELPRIORS @ Medications:    . antiseptic oral rinse  7 mL Mouth Rinse q12n4p  . chlorhexidine  15 mL Mouth Rinse BID  . darbepoetin (ARANESP) injection - DIALYSIS  60 mcg Intravenous Q Thu-HD  . feeding supplement (NEPRO CARB STEADY)  1,000 mL Oral Q24H  . ferric gluconate (FERRLECIT/NULECIT) IV  250 mg Intravenous Q T,Th,S,Su  . heparin  5,000 Units Subcutaneous 3 times per day  . ipratropium-albuterol  3 mL Nebulization Q6H  . pantoprazole (PROTONIX) IV  40 mg Intravenous Q24H  . sodium chloride  3 mL Intravenous Q12H  . thiamine IV  100 mg Intravenous Daily  . valproate sodium  500 mg Intravenous Q12H  . ziprasidone  60 mg Oral QHS   Elmarie Shiley, MD 05/17/2014, 8:35 AM

## 2014-05-17 NOTE — Progress Notes (Signed)
No distres. No new complaints. Flat but cognition appears intact  Filed Vitals:   05/17/14 1003 05/17/14 1100 05/17/14 1144 05/17/14 1200  BP:  109/57  112/65  Pulse:      Temp:   98.4 F (36.9 C)   TempSrc:   Oral   Resp:  18  17  Height:      Weight:      SpO2: 96%      NAD HEENT WNL Chest clear RRR NABS No edema No focal deficits  I have reviewed all of today's lab results. Relevant abnormalities are discussed in the A/P section  No new CXR  IMPRESSION: Acute resp failure - resolved COPD without acute wheezing Pulm edema clinically resolved Hypotension, resolved ESRD Malnutrition Anemia Acute  Encephalopathy - resolving Bipolar D/O New thrombocytopenia 01/09  PLAN: Transfer to med-surg [facility] to resume care as of AM 1/10 Cont current med rx Advance diet DC SQ heparin   [facility] to resume care as of AM 1/10. Discussed with Dr Nicolette Bang, MD ; [facility] [phone].  After 5:30 PM or weekends, call [phone]

## 2014-05-18 MED ORDER — DIVALPROEX SODIUM 500 MG PO DR TAB
500.0000 mg | DELAYED_RELEASE_TABLET | Freq: Two times a day (BID) | ORAL | Status: DC
  Administered 2014-05-18 – 2014-05-20 (×4): 500 mg via ORAL
  Filled 2014-05-18 (×5): qty 1

## 2014-05-18 MED ORDER — PHENOL 1.4 % MT LIQD
1.0000 | OROMUCOSAL | Status: DC | PRN
  Filled 2014-05-18: qty 177

## 2014-05-18 MED ORDER — ACETAMINOPHEN 325 MG PO TABS
650.0000 mg | ORAL_TABLET | ORAL | Status: DC | PRN
Start: 2014-05-18 — End: 2014-05-20
  Administered 2014-05-18 – 2014-05-19 (×6): 650 mg via ORAL
  Filled 2014-05-18 (×7): qty 2

## 2014-05-18 MED ORDER — RENA-VITE PO TABS
1.0000 | ORAL_TABLET | Freq: Every day | ORAL | Status: DC
Start: 2014-05-18 — End: 2014-05-20
  Administered 2014-05-18 – 2014-05-19 (×2): 1 via ORAL
  Filled 2014-05-18 (×3): qty 1

## 2014-05-18 MED ORDER — FLEET ENEMA 7-19 GM/118ML RE ENEM
1.0000 | ENEMA | Freq: Once | RECTAL | Status: AC
  Administered 2014-05-18: 1 via RECTAL
  Filled 2014-05-18: qty 1

## 2014-05-18 NOTE — Evaluation (Signed)
Physical Therapy Evaluation Patient Details Name: Jennifer Lawson MRN: [id number] DOB: 01/05/1961 Today's Date: 05/18/2014   History of Present Illness   54yo female DM II, hx seizure, COPD, heavy smoker with extensive psych hx, CKD V (fistula placed 06/2013), admitted 1/6 with AMS, metabolic acidosis.  Pt extubated on 1/8.  Clinical Impression  Pt adm due to above. Pt presents with decreased independence with functional mobility. Pt is a fall risk due to cognitive deficits. Pt to benefit from skilled acute PT to address deficits and maximize functional mobility. Pt reports multiple falls at home. No family present during evaluation. Recommend SNF for post acute rehab and 24/7 (A) upon D/C.     Follow Up Recommendations SNF;Supervision/Assistance - 24 hour;Other (comment) (may refuse SNF)    Equipment Recommendations  None recommended by PT    Recommendations for Other Services OT consult     Precautions / Restrictions Precautions Precautions: Fall Precaution Comments: pt reports multiple falls at home Restrictions Weight Bearing Restrictions: No      Mobility  Bed Mobility Overal bed mobility: Needs Assistance Bed Mobility: Supine to Sit;Sit to Supine     Supine to sit: Min guard;HOB elevated Sit to supine: Min guard   General bed mobility comments: relying heavily on handrails; min guard for setup and positioning   Transfers Overall transfer level: Needs assistance Equipment used: Rolling walker (2 wheeled) Transfers: Sit to/from Stand Sit to Stand: Min guard         General transfer comment: max multimodal cues for hand placement and RW safety   Ambulation/Gait Ambulation/Gait assistance: Min assist Ambulation Distance (Feet): 20 Feet Assistive device: Rolling walker (2 wheeled) Gait Pattern/deviations: Decreased stride length;Step-through pattern;Staggering left;Trunk flexed;Wide base of support Gait velocity: decreased Gait velocity interpretation: Below  normal speed for age/gender General Gait Details: pt c/o intermittent "ankle pain"; pt with difficulty managing RW; cues for safety and min (A) to balance   Stairs            Wheelchair Mobility    Modified Rankin (Stroke Patients Only)       Balance Overall balance assessment: History of Falls;Needs assistance Sitting-balance support: Feet supported;No upper extremity supported Sitting balance-Leahy Scale: Fair     Standing balance support: During functional activity;Bilateral upper extremity supported Standing balance-Leahy Scale: Poor Standing balance comment: RW and min guard to steady                              Pertinent Vitals/Pain Pain Assessment: Faces Faces Pain Scale: Hurts even more Pain Location: c/o catheter pain  Pain Descriptors / Indicators: Burning Pain Intervention(s): Monitored during session;Repositioned;Other (comment) (requesting removal of catheter )    Home Living Family/patient expects to be discharged to:: Private residence Living Arrangements: Children Available Help at Discharge: Family;Available PRN/intermittently Type of Home: House Home Access: Stairs to enter   Entrance Stairs-Number of Steps: 1 Home Layout: One level Home Equipment: Walker - 2 wheels;Shower seat Additional Comments: no family present    Prior Function Level of Independence: Independent with assistive device(s)               Hand Dominance        Extremity/Trunk Assessment   Upper Extremity Assessment: Defer to OT evaluation           Lower Extremity Assessment: Generalized weakness      Cervical / Trunk Assessment: Normal  Communication   Communication: No difficulties;Other (comment) (perseverating  on wanting coffee and bath)  Cognition Arousal/Alertness: Awake/alert Behavior During Therapy: Flat affect;Impulsive Overall Cognitive Status: No family/caregiver present to determine baseline cognitive functioning Area of  Impairment: Attention;Memory;Following commands;Problem solving   Current Attention Level: Sustained Memory: Decreased short-term memory Following Commands: Follows one step commands with increased time     Problem Solving: Slow processing;Difficulty sequencing;Requires verbal cues;Requires tactile cues General Comments: perseverating on wanting coffee and bath; max cues for sequencing     General Comments      Exercises        Assessment/Plan    PT Assessment Patient needs continued PT services  PT Diagnosis Difficulty walking;Generalized weakness;Acute pain;Altered mental status   PT Problem List Decreased strength;Decreased activity tolerance;Decreased balance;Decreased mobility;Decreased cognition;Decreased knowledge of use of DME;Decreased safety awareness;Pain  PT Treatment Interventions DME instruction;Gait training;Stair training;Functional mobility training;Therapeutic activities;Therapeutic exercise;Balance training;Neuromuscular re-education;Patient/family education   PT Goals (Current goals can be found in the Care Plan section) Acute Rehab PT Goals Patient Stated Goal: to get coffee and this catheter out PT Goal Formulation: With patient Time For Goal Achievement: 05/25/14 Potential to Achieve Goals: Fair    Frequency Min 3X/week   Barriers to discharge Decreased caregiver support does not have 24/7 (A)    Co-evaluation               End of Session Equipment Utilized During Treatment: Gait belt Activity Tolerance: Patient tolerated treatment well Patient left: in bed;with bed alarm set;with call bell/phone within reach Nurse Communication: Mobility status;Precautions         Time: 4628-6381 PT Time Calculation (min) (ACUTE ONLY): 15 min   Charges:   PT Evaluation $Initial PT Evaluation Tier I: 1 Procedure PT Treatments $Gait Training: 8-22 mins   PT G CodesGustavus Bryant, [place]  [phone] 05/18/2014, 9:56 AM

## 2014-05-18 NOTE — Progress Notes (Signed)
Patient ID: Jennifer Lawson  female  ZJQ:[id number]    DOB: [birth year]    DOA: 05/13/2014  PCP: [facility]  Brief history of present illness  Patient is a 54 year old female with diabetes, seizures, COPD, he is a smoker, extensive psych history, CKD, stage V (fistula placed in 06/2013) was admitted with altered mental status, metabolic acidosis on 1/5. CT head showed no acute abnormality. Next morning however on 1/6, patient was found to be increasingly lethargic with worsening acidosis and PCM was consulted. Patient was intubated on 1/6. Due to possibility of uremia, renal service was also consulted and patient had first hemodialysis on 05/14/13. Patient was extubated on 1/8, has been doing fairly well since extubation, was transferred to floor on 1/9 and [facility] assumed care on 1/10  Assessment/Plan: Principal Problem:   Acute encephalopathy - Improving, patient is currently alert and oriented. Neurology was also consulted during hospitalization who felt that her altered mental status is multifactorial and related to metabolic, renal etiology with polypharmacy playing the role. MRI of the brain was negative for acute infarct.  Active Problems: Acute respiratory failure with hypoxia - Currently stable, O2 sats 95% on room air  End-stage renal disease from progressive CKD stage V -Renal service was consulted due to concerns of uremia causing encephalopathy. The patient had fistula placed in 06/2013. Hemodialysis was started on Wednesday 05/14/13 for management of multiple metabolic abnormalities. Patient received her last dialysis on 1/9 and the likely next on Monday 1/11. - She will need CLIP process due to new hemodialysis   Anion gap metabolic acidosis: - Likely from uremia and ESRD, improved with hemodialysis    Bipolar 1 disorder - Currently stable, takes Restoril, Geodon, Depakote at home - Continue Depakote for now  DVT Prophylaxis:Heparin subcutaneous  Code Status:Full  code  Family Communication:  Disposition:PT evaluation recommending skilled nursing facility  Consultants: [facility]  Nephrology  Neurology  Procedures: Intubation  Hemodialysis   Antibiotics:  None  Subjective: Patient seen and examined, alert and oriented, somewhat upset over dialysis being chronic and permanent issue. Otherwise no acute complaints.  Objective: Weight change:   Intake/Output Summary (Last 24 hours) at 05/18/14 1115 Last data filed at 05/18/14 0942  Gross per 24 hour  Intake   1210 ml  Output   1725 ml  Net   -515 ml   Blood pressure 130/74, pulse 82, temperature 98 F (36.7 C), temperature source Oral, resp. rate 24, height 5\' 2"  (1.575 m), weight 88.8 kg (195 lb 12.3 oz), last menstrual period 05/09/2008, SpO2 95 %.  Physical Exam: General: Alert and awake, oriented x3, not in any acute distress. CVS: S1-S2 clear, no murmur rubs or gallops Chest:Decreased breath sounds at the bases  Abdomen: soft nontender, nondistended, normal bowel sounds  Extremities: no cyanosis, clubbing, trace edema noted bilaterally   Lab Results: Basic Metabolic Panel:  Recent Labs Lab 05/15/14 1700 05/16/14 0425 05/17/14 0600  NA 142 143 139  K 3.2* 3.7 4.0  CL 106 108 102  CO2 28 30 30   GLUCOSE 78 118* 74  BUN 33* 35* 20  CREATININE 4.05* 4.40* 3.50*  CALCIUM 9.6 9.8 9.5  MG 2.4  --   --   PHOS 4.7*  --   --    Liver Function Tests:  Recent Labs Lab 05/14/14 0700 05/15/14 0443 05/15/14 1700  AST 11 12  --   ALT 9 8  --   ALKPHOS 53 45  --   BILITOT 1.1  0.6  --   PROT 6.0 5.4*  --   ALBUMIN 2.8* 2.5* 2.3*    Recent Labs Lab 05/13/14 1810  LIPASE 25    Recent Labs Lab 05/13/14 1808 05/14/14 2200  AMMONIA 25 11   CBC:  Recent Labs Lab 05/14/14 0700  05/16/14 0425 05/17/14 0600  WBC 4.4  < > 6.2 4.6  NEUTROABS 3.2  --   --   --   HGB 9.2*  < > 7.8* 8.6*  HCT 29.9*  < > 26.4* 28.3*  MCV 100.7*  < > 98.1 101.4*  PLT 146*  < > 120*  98*  < > = values in this interval not displayed. Cardiac Enzymes: No results for input(s): CKTOTAL, CKMB, CKMBINDEX, TROPONINI in the last 168 hours. BNP: Invalid input(s): POCBNP CBG:  Recent Labs Lab 05/16/14 0832 05/16/14 1150 05/16/14 1605 05/16/14 2002 05/17/14 1705  GLUCAP 83 84 81 84 94     Micro Results: Recent Results (from the past 240 hour(s))  MRSA PCR Screening     Status: None   Collection Time: 05/13/14 11:29 PM  Result Value Ref Range Status   MRSA by PCR NEGATIVE NEGATIVE Final    Comment:        The GeneXpert MRSA Assay (FDA approved for NASAL specimens only), is one component of a comprehensive MRSA colonization surveillance program. It is not intended to diagnose MRSA infection nor to guide or monitor treatment for MRSA infections.   Culture, blood (routine x 2)     Status: None (Preliminary result)   Collection Time: 05/14/14  5:40 AM  Result Value Ref Range Status   Specimen Description BLOOD LEFT HAND  Final   Special Requests BOTTLES DRAWN AEROBIC AND ANAEROBIC 10CC EACH  Final   Culture   Final           BLOOD CULTURE RECEIVED NO GROWTH TO DATE CULTURE WILL BE HELD FOR 5 DAYS BEFORE ISSUING A FINAL NEGATIVE REPORT Performed at [facility]    Report Status PENDING  Incomplete  Culture, blood (routine x 2)     Status: None (Preliminary result)   Collection Time: 05/14/14  7:00 AM  Result Value Ref Range Status   Specimen Description BLOOD LEFT ARM  Final   Special Requests BOTTLES DRAWN AEROBIC ONLY 6CC  Final   Culture   Final           BLOOD CULTURE RECEIVED NO GROWTH TO DATE CULTURE WILL BE HELD FOR 5 DAYS BEFORE ISSUING A FINAL NEGATIVE REPORT Performed at [facility]    Report Status PENDING  Incomplete  Urine culture     Status: None   Collection Time: 05/14/14  8:30 AM  Result Value Ref Range Status   Specimen Description URINE, CATHETERIZED  Final   Special Requests NONE  Final   Colony Count NO  GROWTH Performed at [facility]   Final   Culture NO GROWTH Performed at [facility]   Final   Report Status 05/15/2014 FINAL  Final    Studies/Results: Ct Head (brain) Wo Contrast  05/13/2014   CLINICAL DATA:  Altered mental status, urinary retention  EXAM: CT HEAD WITHOUT CONTRAST  TECHNIQUE: Contiguous axial images were obtained from the base of the skull through the vertex without intravenous contrast.  COMPARISON:  01/15/2014 and brain MRI a 01/16/2014  FINDINGS: No skull fracture is noted. Again noted mild mucosal thickening posterior aspect of left maxillary sinus. The sphenoid sinuses and mastoid  air cells are unremarkable. Frontal sinuses are unremarkable.  No intracranial hemorrhage, mass effect or midline shift. No acute cortical infarction. No mass lesion is noted on this unenhanced scan. The gray and white-matter differentiation is preserved.  IMPRESSION: No acute intracranial abnormality. Stable mild mucosal thickening posterior aspect of the left maxillary sinus. No definite acute cortical infarction.   Electronically Signed   By: Lahoma Crocker M.D.   On: 05/13/2014 17:27   Mr Brain Wo Contrast  05/14/2014   CLINICAL DATA:  Increasing lethargy for 3 days , encephalopathy. Renal failure.  EXAM: MRI HEAD WITHOUT CONTRAST  TECHNIQUE: Multiplanar, multiecho pulse sequences of the brain and surrounding structures were obtained without intravenous contrast.  COMPARISON:  CT of the head May 13, 2014 and MRI of the head January 16, 2014  FINDINGS: Mild motion degraded examination.  The ventricles and sulci are normal. No abnormal parenchymal signal, mass lesions or mass of affect. No reduced diffusion to suggest acute ischemia. No susceptibility artifact to suggest hemorrhage. Mildly dolichoectatic intracranial vessels.  No abnormal extra-axial fluid collection. No abnormal calvarial bone marrow signal. Ocular globes and orbital contents are unremarkable though not tailored for  evaluation. LEFT maxillary mucosal retention cyst without paranasal sinus air-fluid levels. The mastoid air cells appear well-aerated. No abnormal sellar expansion. Craniocervical junction is maintained. The patient is edentulous.  IMPRESSION: No acute intracranial process, specifically no evidence of acute ischemia.  Mildly dolicoectatic intracranial vessels can be seen with chronic hypertension.   Electronically Signed   By: Elon Alas   On: 05/14/2014 05:19   Dg Chest Port 1 View  05/16/2014   CLINICAL DATA:  Intubation.  EXAM: PORTABLE CHEST - 1 VIEW  COMPARISON:  05/15/2014.  FINDINGS: Endotracheal tube 11 mm above the carina, proximal repositioning suggested. Left IJ line and NG tube in stable position. Persistent left lower lobe atelectasis and/or infiltrate. Tiny left pleural effusion cannot be excluded. No interim change. No pneumothorax. Stable cardiomegaly. No pulmonary venous congestion on today's exam. No acute bony abnormality.  IMPRESSION: 1. Endotracheal tube 11 mm above the carina, proximal repositioning suggested. Remaining lines and tubes in stable position. 2. Persistent left lower lobe atelectasis and/or infiltrate. Tiny left pleural effusion cannot be excluded. No interval change. 3. Cardiomegaly.  Interim resolution of pulmonary venous congestion.   Electronically Signed   By: Marcello Moores  Register   On: 05/16/2014 07:42   Dg Chest Port 1 View  05/15/2014   CLINICAL DATA:  Acute respiratory failure.  Hypoxia.  EXAM: PORTABLE CHEST - 1 VIEW  COMPARISON:  05/14/2014.  FINDINGS: Endotracheal tube tip 2.8 cm above the carina. Left IJ line in stable position. Stable cardiomegaly. Mild pulmonary venous congestion noted on today's exam. Left lower lobe atelectasis and/or infiltrate, progressive from prior exam. Small left pleural effusion cannot be excluded. No pneumothorax. Surgical clips right axilla. No acute bony abnormality.  IMPRESSION: 1. Endotracheal tube tip 2.8 cm above the carina.  Left IJ line in stable position. 2. Cardiomegaly with mild pulmonary venous congestion. 3. Progressive left lower lobe atelectasis and/or infiltrate. Small left pleural effusion cannot be excluded.   Electronically Signed   By: Marcello Moores  Register   On: 05/15/2014 07:39   Dg Chest Portable 1 View  05/14/2014   CLINICAL DATA:  Acute respiratory failure. Intubation and central line placement.  EXAM: PORTABLE CHEST - 1 VIEW  COMPARISON:  05/14/2014  FINDINGS: Endotracheal tube tip is approximately 1 cm above the carina. Nasogastric tube is seen entering the stomach.  A new left internal jugular central venous catheter is seen with tip overlying the distal SVC. No pneumothorax visualized.  Mild linear opacity in the left retrocardiac lung back is this consistent with mild subsegmental atelectasis. No evidence of pulmonary consolidation or pleural effusion.  IMPRESSION: Endotracheal tube tip approximately 1 cm above carina.  Left jugular central venous catheter and nasogastric tube in appropriate position. No pneumothorax visualized.  Mild left basilar atelectasis.   Electronically Signed   By: Earle Gell M.D.   On: 05/14/2014 13:11   Dg Chest Port 1 View  05/14/2014   CLINICAL DATA:  Fever, shortness of breath  EXAM: PORTABLE CHEST - 1 VIEW  COMPARISON:  04/12/2014  FINDINGS: Shallow inspiration. Cardiac enlargement with mild prominence of pulmonary vascularity suggesting mild vascular congestion. No focal airspace disease or edema. No blunting of costophrenic angles. No pneumothorax. Mediastinal contours appear intact.  IMPRESSION: Cardiac enlargement with mild pulmonary vascular congestion. No edema or consolidation.   Electronically Signed   By: Lucienne Capers M.D.   On: 05/14/2014 05:30   Dg Knee Left Port  05/15/2014   CLINICAL DATA:  Recent fall in bathroom, patellar bruising and pain, initial encounter  EXAM: PORTABLE LEFT KNEE - 1-2 VIEW  COMPARISON:  None.  FINDINGS: Degenerative changes of the left knee  joint are noted particularly in the medial joint space. No acute fracture is seen. A small joint effusion is noted.  IMPRESSION: Small joint effusion.  Degenerative changes.   Electronically Signed   By: Inez Catalina M.D.   On: 05/15/2014 15:01   Dg Tibia/fibula Left Port  05/15/2014   CLINICAL DATA:  Recent fall in bathroom with leg pain and hematoma, initial encounter  EXAM: PORTABLE LEFT TIBIA AND FIBULA - 2 VIEW  COMPARISON:  None.  FINDINGS: No acute fracture or dislocation is noted. No gross soft tissue abnormality is seen.  IMPRESSION: No acute abnormality noted.   Electronically Signed   By: Inez Catalina M.D.   On: 05/15/2014 15:00   Dg Femur Port 1v Left  05/15/2014   CLINICAL DATA:  Recent fall in bathroom with leg pain  EXAM: DG FEMUR 1V PORT*L*  COMPARISON:  None.  FINDINGS: There is no evidence of fracture or other focal bone lesions. Soft tissues are unremarkable.  IMPRESSION: No acute abnormality is noted on this limited exam.   Electronically Signed   By: Inez Catalina M.D.   On: 05/15/2014 15:02    Medications: Scheduled Meds: . antiseptic oral rinse  7 mL Mouth Rinse q12n4p  . chlorhexidine  15 mL Mouth Rinse BID  . darbepoetin (ARANESP) injection - DIALYSIS  60 mcg Intravenous Q Thu-HD  . docusate sodium  100 mg Oral BID  . ferric gluconate (FERRLECIT/NULECIT) IV  250 mg Intravenous Q T,Th,S,Su  . multivitamin  1 tablet Oral QHS  . nicotine  21 mg Transdermal Daily  . sodium chloride  3 mL Intravenous Q12H  . valproate sodium  500 mg Intravenous Q12H  . ziprasidone  60 mg Oral QHS      LOS: 5 days   RAI,RIPUDEEP M.D. [facility] 05/18/2014, 11:15 AM Pager: [phone]  If 7PM-7AM, please contact night-coverage [email or web address] Password [email or web address]

## 2014-05-18 NOTE — Progress Notes (Signed)
Pt requested for stool softner, paged to on-call provider, and order received. Will cont to monitor.

## 2014-05-18 NOTE — Progress Notes (Signed)
Patient ID: Jennifer Lawson, female   DOB: 11-24-1960, 54 y.o.   MRN: [id number]  [facility] Progress Note   Assessment/ Plan:   1. End-stage renal disease from progressive chronic kidney disease: Hemodialysis started on Wednesday for management of multiple metabolic abnormalities and possible uremic encephalopathy. Hemodialysis ordered for tomorrow via her right transposed brachiobasilic fistula-4 hours and challenge dry weight. Discontinue Foley catheter and ambulate in hallways with assistance. Process initiated to get outpatient dialysis unit. 2. Metabolic encephalopathy: Suspected to be from uremic encephalopathy/metabolic acidosis-improved with hemodialysis and patient's mental status appears to be back to baseline per her daughter, Jennifer Lawson 3. Nutrition: Start renal vitamin and protein supplementation as needed. 4. Anemia of chronic disease: Start intravenous iron for iron deficiency and ESA. 5. Metabolic bone disease: Phosphorus levels acceptable-currently not on binders , will monitor phosphorus levels with labs tomorrow.  Subjective:   Reports to be feeling better with the exception of a sore throat/cough and trouble sleeping overnight. With multiple questions about her medications and chronic hemodialysis.    Objective:   BP 130/74 mmHg  Pulse 82  Temp(Src) 98 F (36.7 C) (Oral)  Resp 24  Ht 5\' 2"  (1.575 m)  Wt 88.8 kg (195 lb 12.3 oz)  BMI 35.80 kg/m2  SpO2 95%  LMP 05/09/2008  Physical Exam: Gen: Comfortably resting in bed CVS: Pulse regular in rate and rhythm Resp: Coarse breath sounds-transmitted Abd: Soft, obese, nontender Ext: 1+ lower extremity edema  Labs: BMET  Recent Labs Lab 05/13/14 1722 05/13/14 1735 05/14/14 0700 05/14/14 2120 05/15/14 0443 05/15/14 1700 05/16/14 0425 05/17/14 0600  NA 142 141 144 142 142 142 143 139  K 5.1 5.1 4.5 3.3* 3.1* 3.2* 3.7 4.0  CL 110 114* 117* 107 108 106 108 102  CO2 19  --  16* 24 26 28 30 30    GLUCOSE 77 72 120* 99 97 78 118* 74  BUN 60* 58* 64* 40* 42* 33* 35* 20  CREATININE 4.80* 4.40* 5.12* 4.01* 4.28* 4.05* 4.40* 3.50*  CALCIUM 9.0  --  9.0 9.3 9.4 9.6 9.8 9.5  PHOS  --   --   --   --   --  4.7*  --   --    CBC  Recent Labs Lab 05/13/14 1722  05/14/14 0700 05/15/14 0443 05/16/14 0425 05/17/14 0600  WBC 4.3  --  4.4 5.1 6.2 4.6  NEUTROABS 2.9  --  3.2  --   --   --   HGB 9.4*  < > 9.2* 8.2* 7.8* 8.6*  HCT 30.7*  < > 29.9* 26.8* 26.4* 28.3*  MCV 97.2  --  100.7* 94.4 98.1 101.4*  PLT 165  --  146* 130* 120* 98*  < > = values in this interval not displayed.  Medications:    . antiseptic oral rinse  7 mL Mouth Rinse q12n4p  . chlorhexidine  15 mL Mouth Rinse BID  . darbepoetin (ARANESP) injection - DIALYSIS  60 mcg Intravenous Q Thu-HD  . docusate sodium  100 mg Oral BID  . ferric gluconate (FERRLECIT/NULECIT) IV  250 mg Intravenous Q T,Th,S,Su  . multivitamin  1 tablet Oral QHS  . nicotine  21 mg Transdermal Daily  . sodium chloride  3 mL Intravenous Q12H  . valproate sodium  500 mg Intravenous Q12H  . ziprasidone  60 mg Oral QHS   Elmarie Shiley, MD 05/18/2014, 10:27 AM

## 2014-05-19 LAB — RENAL FUNCTION PANEL
Albumin: 2.4 g/dL — ABNORMAL LOW (ref 3.5–5.2)
Anion gap: 5 (ref 5–15)
BUN: 31 mg/dL — ABNORMAL HIGH (ref 6–23)
CO2: 29 mmol/L (ref 19–32)
Calcium: 10 mg/dL (ref 8.4–10.5)
Chloride: 105 mEq/L (ref 96–112)
Creatinine, Ser: 4.06 mg/dL — ABNORMAL HIGH (ref 0.50–1.10)
GFR calc Af Amer: 13 mL/min — ABNORMAL LOW (ref >90)
GFR calc non Af Amer: 12 mL/min — ABNORMAL LOW (ref >90)
Glucose, Bld: 84 mg/dL (ref 70–99)
Phosphorus: 5 mg/dL — ABNORMAL HIGH (ref 2.3–4.6)
Potassium: 4 mmol/L (ref 3.5–5.1)
Sodium: 139 mmol/L (ref 135–145)

## 2014-05-19 LAB — CBC
HCT: 27.9 % — ABNORMAL LOW (ref 36.0–46.0)
Hemoglobin: 8.5 g/dL — ABNORMAL LOW (ref 12.0–15.0)
MCH: 29.4 pg (ref 26.0–34.0)
MCHC: 30.5 g/dL (ref 30.0–36.0)
MCV: 96.5 fL (ref 78.0–100.0)
Platelets: 144 10*3/uL — ABNORMAL LOW (ref 150–400)
RBC: 2.89 MIL/uL — ABNORMAL LOW (ref 3.87–5.11)
RDW: 15.8 % — ABNORMAL HIGH (ref 11.5–15.5)
WBC: 4.8 10*3/uL (ref 4.0–10.5)

## 2014-05-19 MED ORDER — HEPARIN SODIUM (PORCINE) 1000 UNIT/ML DIALYSIS: 40.0000 [IU]/kg | INTRAMUSCULAR | Status: DC | PRN

## 2014-05-19 NOTE — Procedures (Signed)
I have seen and examined this patient and agree with the plan of care. Patient seen on dialysis and appear to have no emerging dialysis issues  . Avf in left arm   Junelle Hashemi W 05/19/2014, 9:45 AM

## 2014-05-19 NOTE — Progress Notes (Signed)
Patient ID: Jennifer Lawson  female  ALP:[id number]    DOB: 1961/04/22    DOA: 05/13/2014  PCP: [facility]  Brief history of present illness  Patient is a 54 year old female with diabetes, seizures, COPD, he is a smoker, extensive psych history, CKD, stage V (fistula placed in 06/2013) was admitted with altered mental status, metabolic acidosis on 1/5. CT head showed no acute abnormality. Next morning however on 1/6, patient was found to be increasingly lethargic with worsening acidosis and PCM was consulted. Patient was intubated on 1/6. Due to possibility of uremia, renal service was also consulted and patient had first hemodialysis on 05/14/13. Patient was extubated on 1/8, has been doing fairly well since extubation, was transferred to floor on 1/9 and [facility] assumed care on 1/10  Assessment/Plan: Acute encephalopathy -Improving, patient is currently alert and oriented.  -Neurology was also consulted during hospitalization who felt that her altered mental status is multifactorial and related to metabolic, renal etiology with polypharmacy playing the role.  -MRI of the brain was negative for acute infarct.  Acute respiratory failure with hypoxia - Currently stable, O2 sats 95% on room air. -Resolved.  End-stage renal disease from progressive CKD stage V -Renal service was consulted due to concerns of uremia causing encephalopathy. The patient had fistula placed in 06/2013. Hemodialysis was started on Wednesday 05/14/13 for management of multiple metabolic abnormalities.  Had dialysis today. - She will need CLIP process due to new hemodialysis   Anion gap metabolic acidosis: - Likely from uremia and ESRD, improved with hemodialysis    Bipolar 1 disorder - Currently stable, takes Restoril, Geodon, Depakote at home - Continue Depakote for now  DVT Prophylaxis:Heparin subcutaneous  Code Status:Full code  Family Communication: No family at bedside.  Disposition: Pending placement to  skilled nursing facility.  Consultants: [facility]ph[facility]  Neurology  Procedures: Intubation  Hemodialysis   Antibiotics:  None  Subjective: No specific complaints, reports having the urge to smoke.  Objective: Weight change:   Intake/Output Summary (Last 24 hours) at 05/19/14 1331 Last data filed at 05/19/14 1324  Gross per 24 hour  Intake    890 ml  Output   5300 ml  Net  -4410 ml   Blood pressure 118/65, pulse 86, temperature 97.3 F (36.3 C), temperature source Oral, resp. rate 16, height 5\' 2"  (1.575 m), weight 82.9 kg (182 lb 12.2 oz), last menstrual period 05/09/2008, SpO2 96 %.  Physical Exam: General: Awake, Oriented, No acute distress. HEENT: EOMI. Neck: Supple CV: S1 and S2 Lungs: Clear to ascultation bilaterally Abdomen: Soft, Nontender, Nondistended, +bowel sounds. Ext: Good pulses. Trace edema.   Lab Results: Basic Metabolic Panel:  Recent Labs Lab 05/15/14 1700  05/17/14 0600 05/19/14 0726  NA 142  < > 139 139  K 3.2*  < > 4.0 4.0  CL 106  < > 102 105  CO2 28  < > 30 29  GLUCOSE 78  < > 74 84  BUN 33*  < > 20 31*  CREATININE 4.05*  < > 3.50* 4.06*  CALCIUM 9.6  < > 9.5 10.0  MG 2.4  --   --   --   PHOS 4.7*  --   --  5.0*  < > = values in this interval not displayed. Liver Function Tests:  Recent Labs Lab 05/14/14 0700 05/15/14 0443 05/15/14 1700 05/19/14 0726  AST 11 12  --   --   ALT 9 8  --   --  ALKPHOS 53 45  --   --   BILITOT 1.1 0.6  --   --   PROT 6.0 5.4*  --   --   ALBUMIN 2.8* 2.5* 2.3* 2.4*    Recent Labs Lab 05/13/14 1810  LIPASE 25    Recent Labs Lab 05/13/14 1808 05/14/14 2200  AMMONIA 25 11   CBC:  Recent Labs Lab 05/14/14 0700  05/17/14 0600 05/19/14 0727  WBC 4.4  < > 4.6 4.8  NEUTROABS 3.2  --   --   --   HGB 9.2*  < > 8.6* 8.5*  HCT 29.9*  < > 28.3* 27.9*  MCV 100.7*  < > 101.4* 96.5  PLT 146*  < > 98* 144*  < > = values in this interval not displayed. Cardiac Enzymes: No results  for input(s): CKTOTAL, CKMB, CKMBINDEX, TROPONINI in the last 168 hours. BNP: Invalid input(s): POCBNP CBG:  Recent Labs Lab 05/16/14 0832 05/16/14 1150 05/16/14 1605 05/16/14 2002 05/17/14 1705  GLUCAP 83 84 81 84 94     Micro Results: Recent Results (from the past 240 hour(s))  MRSA PCR Screening     Status: None   Collection Time: 05/13/14 11:29 PM  Result Value Ref Range Status   MRSA by PCR NEGATIVE NEGATIVE Final    Comment:        The GeneXpert MRSA Assay (FDA approved for NASAL specimens only), is one component of a comprehensive MRSA colonization surveillance program. It is not intended to diagnose MRSA infection nor to guide or monitor treatment for MRSA infections.   Culture, blood (routine x 2)     Status: None (Preliminary result)   Collection Time: 05/14/14  5:40 AM  Result Value Ref Range Status   Specimen Description BLOOD LEFT HAND  Final   Special Requests BOTTLES DRAWN AEROBIC AND ANAEROBIC 10CC EACH  Final   Culture   Final           BLOOD CULTURE RECEIVED NO GROWTH TO DATE CULTURE WILL BE HELD FOR 5 DAYS BEFORE ISSUING A FINAL NEGATIVE REPORT Performed at [facility]    Report Status PENDING  Incomplete  Culture, blood (routine x 2)     Status: None (Preliminary result)   Collection Time: 05/14/14  7:00 AM  Result Value Ref Range Status   Specimen Description BLOOD LEFT ARM  Final   Special Requests BOTTLES DRAWN AEROBIC ONLY 6CC  Final   Culture   Final           BLOOD CULTURE RECEIVED NO GROWTH TO DATE CULTURE WILL BE HELD FOR 5 DAYS BEFORE ISSUING A FINAL NEGATIVE REPORT Performed at [facility]    Report Status PENDING  Incomplete  Urine culture     Status: None   Collection Time: 05/14/14  8:30 AM  Result Value Ref Range Status   Specimen Description URINE, CATHETERIZED  Final   Special Requests NONE  Final   Colony Count NO GROWTH Performed at [facility]   Final   Culture NO GROWTH Performed at  [facility]   Final   Report Status 05/15/2014 FINAL  Final    Studies/Results: Ct Head (brain) Wo Contrast  05/13/2014   CLINICAL DATA:  Altered mental status, urinary retention  EXAM: CT HEAD WITHOUT CONTRAST  TECHNIQUE: Contiguous axial images were obtained from the base of the skull through the vertex without intravenous contrast.  COMPARISON:  01/15/2014 and brain MRI a 01/16/2014  FINDINGS: No skull  fracture is noted. Again noted mild mucosal thickening posterior aspect of left maxillary sinus. The sphenoid sinuses and mastoid air cells are unremarkable. Frontal sinuses are unremarkable.  No intracranial hemorrhage, mass effect or midline shift. No acute cortical infarction. No mass lesion is noted on this unenhanced scan. The gray and white-matter differentiation is preserved.  IMPRESSION: No acute intracranial abnormality. Stable mild mucosal thickening posterior aspect of the left maxillary sinus. No definite acute cortical infarction.   Electronically Signed   By: Lahoma Crocker M.D.   On: 05/13/2014 17:27   Mr Brain Wo Contrast  05/14/2014   CLINICAL DATA:  Increasing lethargy for 3 days , encephalopathy. Renal failure.  EXAM: MRI HEAD WITHOUT CONTRAST  TECHNIQUE: Multiplanar, multiecho pulse sequences of the brain and surrounding structures were obtained without intravenous contrast.  COMPARISON:  CT of the head May 13, 2014 and MRI of the head January 16, 2014  FINDINGS: Mild motion degraded examination.  The ventricles and sulci are normal. No abnormal parenchymal signal, mass lesions or mass of affect. No reduced diffusion to suggest acute ischemia. No susceptibility artifact to suggest hemorrhage. Mildly dolichoectatic intracranial vessels.  No abnormal extra-axial fluid collection. No abnormal calvarial bone marrow signal. Ocular globes and orbital contents are unremarkable though not tailored for evaluation. LEFT maxillary mucosal retention cyst without paranasal sinus air-fluid  levels. The mastoid air cells appear well-aerated. No abnormal sellar expansion. Craniocervical junction is maintained. The patient is edentulous.  IMPRESSION: No acute intracranial process, specifically no evidence of acute ischemia.  Mildly dolicoectatic intracranial vessels can be seen with chronic hypertension.   Electronically Signed   By: Elon Alas   On: 05/14/2014 05:19   Dg Chest Port 1 View  05/16/2014   CLINICAL DATA:  Intubation.  EXAM: PORTABLE CHEST - 1 VIEW  COMPARISON:  05/15/2014.  FINDINGS: Endotracheal tube 11 mm above the carina, proximal repositioning suggested. Left IJ line and NG tube in stable position. Persistent left lower lobe atelectasis and/or infiltrate. Tiny left pleural effusion cannot be excluded. No interim change. No pneumothorax. Stable cardiomegaly. No pulmonary venous congestion on today's exam. No acute bony abnormality.  IMPRESSION: 1. Endotracheal tube 11 mm above the carina, proximal repositioning suggested. Remaining lines and tubes in stable position. 2. Persistent left lower lobe atelectasis and/or infiltrate. Tiny left pleural effusion cannot be excluded. No interval change. 3. Cardiomegaly.  Interim resolution of pulmonary venous congestion.   Electronically Signed   By: Marcello Moores  Register   On: 05/16/2014 07:42   Dg Chest Port 1 View  05/15/2014   CLINICAL DATA:  Acute respiratory failure.  Hypoxia.  EXAM: PORTABLE CHEST - 1 VIEW  COMPARISON:  05/14/2014.  FINDINGS: Endotracheal tube tip 2.8 cm above the carina. Left IJ line in stable position. Stable cardiomegaly. Mild pulmonary venous congestion noted on today's exam. Left lower lobe atelectasis and/or infiltrate, progressive from prior exam. Small left pleural effusion cannot be excluded. No pneumothorax. Surgical clips right axilla. No acute bony abnormality.  IMPRESSION: 1. Endotracheal tube tip 2.8 cm above the carina. Left IJ line in stable position. 2. Cardiomegaly with mild pulmonary venous  congestion. 3. Progressive left lower lobe atelectasis and/or infiltrate. Small left pleural effusion cannot be excluded.   Electronically Signed   By: Marcello Moores  Register   On: 05/15/2014 07:39   Dg Chest Portable 1 View  05/14/2014   CLINICAL DATA:  Acute respiratory failure. Intubation and central line placement.  EXAM: PORTABLE CHEST - 1 VIEW  COMPARISON:  05/14/2014  FINDINGS: Endotracheal tube tip is approximately 1 cm above the carina. Nasogastric tube is seen entering the stomach. A new left internal jugular central venous catheter is seen with tip overlying the distal SVC. No pneumothorax visualized.  Mild linear opacity in the left retrocardiac lung back is this consistent with mild subsegmental atelectasis. No evidence of pulmonary consolidation or pleural effusion.  IMPRESSION: Endotracheal tube tip approximately 1 cm above carina.  Left jugular central venous catheter and nasogastric tube in appropriate position. No pneumothorax visualized.  Mild left basilar atelectasis.   Electronically Signed   By: Earle Gell M.D.   On: 05/14/2014 13:11   Dg Chest Port 1 View  05/14/2014   CLINICAL DATA:  Fever, shortness of breath  EXAM: PORTABLE CHEST - 1 VIEW  COMPARISON:  04/12/2014  FINDINGS: Shallow inspiration. Cardiac enlargement with mild prominence of pulmonary vascularity suggesting mild vascular congestion. No focal airspace disease or edema. No blunting of costophrenic angles. No pneumothorax. Mediastinal contours appear intact.  IMPRESSION: Cardiac enlargement with mild pulmonary vascular congestion. No edema or consolidation.   Electronically Signed   By: Lucienne Capers M.D.   On: 05/14/2014 05:30   Dg Knee Left Port  05/15/2014   CLINICAL DATA:  Recent fall in bathroom, patellar bruising and pain, initial encounter  EXAM: PORTABLE LEFT KNEE - 1-2 VIEW  COMPARISON:  None.  FINDINGS: Degenerative changes of the left knee joint are noted particularly in the medial joint space. No acute fracture is  seen. A small joint effusion is noted.  IMPRESSION: Small joint effusion.  Degenerative changes.   Electronically Signed   By: Inez Catalina M.D.   On: 05/15/2014 15:01   Dg Tibia/fibula Left Port  05/15/2014   CLINICAL DATA:  Recent fall in bathroom with leg pain and hematoma, initial encounter  EXAM: PORTABLE LEFT TIBIA AND FIBULA - 2 VIEW  COMPARISON:  None.  FINDINGS: No acute fracture or dislocation is noted. No gross soft tissue abnormality is seen.  IMPRESSION: No acute abnormality noted.   Electronically Signed   By: Inez Catalina M.D.   On: 05/15/2014 15:00   Dg Femur Port 1v Left  05/15/2014   CLINICAL DATA:  Recent fall in bathroom with leg pain  EXAM: DG FEMUR 1V PORT*L*  COMPARISON:  None.  FINDINGS: There is no evidence of fracture or other focal bone lesions. Soft tissues are unremarkable.  IMPRESSION: No acute abnormality is noted on this limited exam.   Electronically Signed   By: Inez Catalina M.D.   On: 05/15/2014 15:02    Medications: Scheduled Meds: . antiseptic oral rinse  7 mL Mouth Rinse q12n4p  . chlorhexidine  15 mL Mouth Rinse BID  . darbepoetin (ARANESP) injection - DIALYSIS  60 mcg Intravenous Q Thu-HD  . divalproex  500 mg Oral Q12H  . docusate sodium  100 mg Oral BID  . ferric gluconate (FERRLECIT/NULECIT) IV  250 mg Intravenous Q T,Th,S,Su  . multivitamin  1 tablet Oral QHS  . nicotine  21 mg Transdermal Daily  . sodium chloride  3 mL Intravenous Q12H  . ziprasidone  60 mg Oral QHS      LOS: 6 days   Tierra Divelbiss A M.D. [facility] 05/19/2014, 1:31 PM  If 7PM-7AM, please contact night-coverage [email or web address] Password [email or web address]

## 2014-05-19 NOTE — Clinical Social Work Note (Signed)
CSW has called to leave patient's daughter a message stating that patient has been offered a bed by [facility]. Patient does not have any other bed offers at this time. CSW explained the importance of daughter returning call to accept offer so that patient outpatient HD can be arranged. At this time patient's only SNF option is [facility].   Liz Beach MSW, [place], [place], [phone]

## 2014-05-19 NOTE — Progress Notes (Signed)
05/19/2014 9:13 AM Hemodialysis Note; This patient needs to have an outpatient schedule set up however I will need to know where the patient will be discharged to. I have contacted Tomi Bamberger case manager and advised her of the situation and asked that she keep me updated.Thank you. Gordy Savers

## 2014-05-19 NOTE — Clinical Social Work Placement (Signed)
Clinical Social Work Department CLINICAL SOCIAL WORK PLACEMENT NOTE 05/19/2014  Patient:  Jennifer Lawson, Jennifer Lawson  Account Number:  [id number] Admit date:  05/13/2014  Clinical Social Worker:  Kemper Durie, [place]  Date/time:  05/19/2014 05:04 PM  Clinical Social Work is seeking post-discharge placement for this patient at the following level of care:   SKILLED NURSING   (*CSW will update this form in Epic as items are completed)   05/19/2014  Patient/family provided with [place] Department of Clinical Social Work's list of facilities offering this level of care within the geographic area requested by the patient (or if unable, by the patient's family).  05/19/2014  Patient/family informed of their freedom to choose among providers that offer the needed level of care, that participate in Medicare, Medicaid or managed care program needed by the patient, have an available bed and are willing to accept the patient.  05/19/2014  Patient/family informed of [facility]' ownership interest in [facility], as well as of the fact that they are under no obligation to receive care at this facility.  PASARR submitted to EDS on  PASARR number received on   FL2 transmitted to all facilities in geographic area requested by pt/family on  05/19/2014 FL2 transmitted to all facilities within larger geographic area on   Patient informed that his/her managed care company has contracts with or will negotiate with  certain facilities, including the following:     Patient/family informed of bed offers received:  05/19/2014 Patient chooses bed at  Physician recommends and patient chooses bed at    Patient to be transferred to  on   Patient to be transferred to facility by  Patient and family notified of transfer on  Name of family member notified:    The following physician request were entered in Epic:   Additional Comments:    Liz Beach MSW, [place], [place], [phone]

## 2014-05-19 NOTE — Progress Notes (Signed)
Physical Therapy Treatment Patient Details Name: Jennifer Lawson MRN: [id number] DOB: 1961-04-20 Today's Date: 05/19/2014    History of Present Illness  54yo female DM II, hx seizure, COPD, heavy smoker with extensive psych hx, CKD V (fistula placed 06/2013), admitted 1/6 with AMS, metabolic acidosis.  Pt extubated on 1/8.    PT Comments    Pt's RN found therapist as pt requesting to walk today and originally was not on the schedule due to HD. Upon therapist entering the room, pt very inconsistent with her requests and tangential. Pt eventually agreed to wanting to work on walking; see details below in note. Continue to recommend SNF for d/c plan and continue POC.   Follow Up Recommendations  SNF;Supervision/Assistance - 24 hour;Other (comment) (may refuse SNF)     Equipment Recommendations  None recommended by PT    Recommendations for Other Services       Precautions / Restrictions Precautions Precautions: Fall Restrictions Weight Bearing Restrictions: No    Mobility  Bed Mobility Overal bed mobility: Needs Assistance Bed Mobility: Supine to Sit;Sit to Supine     Supine to sit: Min guard;HOB elevated Sit to supine: Min guard   General bed mobility comments: cues for initiation and technique  Transfers Overall transfer level: Needs assistance Equipment used: Rolling walker (2 wheeled) Transfers: Sit to/from Stand Sit to Stand: Min guard         General transfer comment: cues for hand placement and use of RW. Upon returning back to bed, pt wanting to leave RW behind and grabbing for bed rails.   Ambulation/Gait Ambulation/Gait assistance: Min assist Ambulation Distance (Feet): 30 Feet Assistive device: Rolling walker (2 wheeled) Gait Pattern/deviations: Step-through pattern;Decreased step length - right;Decreased step length - left;Trunk flexed Gait velocity: decreased   General Gait Details: very slow cadence and cues for safe use of RW during turning.  Extra time to process all commands   Stairs            Wheelchair Mobility    Modified Rankin (Stroke Patients Only)       Balance Overall balance assessment: History of Falls;Needs assistance         Standing balance support: During functional activity;Bilateral upper extremity supported Standing balance-Leahy Scale: Poor Standing balance comment: requires UE support for balance                    Cognition Arousal/Alertness: Awake/alert Behavior During Therapy: Flat affect;Impulsive Overall Cognitive Status: No family/caregiver present to determine baseline cognitive functioning Area of Impairment: Attention;Memory;Following commands;Problem solving   Current Attention Level: Sustained Memory: Decreased short-term memory Following Commands: Follows one step commands with increased time     Problem Solving: Slow processing;Difficulty sequencing;Requires verbal cues;Requires tactile cues General Comments: perseverating on "I want to leave" and inconsistent with expressing her wants    Exercises      General Comments        Pertinent Vitals/Pain Pain Assessment: No/denies pain    Home Living                      Prior Function            PT Goals (current goals can now be found in the care plan section) Acute Rehab PT Goals Patient Stated Goal: "I want to leave" PT Goal Formulation: With patient Time For Goal Achievement: 05/25/14 Potential to Achieve Goals: Fair Progress towards PT goals: Progressing toward goals    Frequency  Min 3X/week  PT Plan Current plan remains appropriate    Co-evaluation             End of Session Equipment Utilized During Treatment: Gait belt Activity Tolerance: Patient tolerated treatment well Patient left: in bed;with bed alarm set;with call bell/phone within reach     Time: 5271-2929 PT Time Calculation (min) (ACUTE ONLY): 15 min  Charges:  $Gait Training: 8-22 mins                     G Codes:      Allayne Gitelman 05/19/2014, 3:57 PM

## 2014-05-19 NOTE — Clinical Social Work Psychosocial (Signed)
Clinical Social Work Department BRIEF PSYCHOSOCIAL ASSESSMENT 05/19/2014  Patient:  Jennifer Lawson, Jennifer Lawson     Account Number:  [id number]     Admit date:  05/13/2014  Clinical Social Worker:  Lovey Newcomer  Date/Time:  05/19/2014 01:00 PM  Referred by:  Physician  Date Referred:  05/19/2014 Referred for  SNF Placement   Other Referral:   NA   Interview type:  Patient Other interview type:   Patient and daughter interviewed to complete assessment.    PSYCHOSOCIAL DATA Living Status:  FAMILY Admitted from facility:   Level of care:   Primary support name:  [name] Primary support relationship to patient:  CHILD, ADULT Degree of support available:   Support is good.    CURRENT CONCERNS Current Concerns  Post-Acute Placement   Other Concerns:   NA    SOCIAL WORK ASSESSMENT / PLAN CSW met with patient at bedside and with daughter on phone. Patient is agreeable to SNF placement at discharge and daughter agrees that SNF placement is needed. CSW explained SNF search/placement process and answered questions. CSW explained that the patient and daughter will need to choose facility quickly so that outpatient hemodialysis can be arranged for the patient. Patient appeared to have flat affect and was minimally engaged in assessment and slow to respond. CSW will follow up with bed offers.   Assessment/plan status:  Psychosocial Support/Ongoing Assessment of Needs Other assessment/ plan:   Complete Fl2, Fax, PASRR   Information/referral to community resources:   CSW contact information and SNF list given.    PATIENT'S/FAMILY'S RESPONSE TO PLAN OF CARE: Patient and daughter agreeable to SNF placement. CSW will assist as appropriate.       Liz Beach MSW, [place], [employer], [phone]

## 2014-05-20 DIAGNOSIS — N189 Chronic kidney disease, unspecified: Secondary | ICD-10-CM | POA: Diagnosis not present

## 2014-05-20 DIAGNOSIS — E872 Acidosis: Secondary | ICD-10-CM | POA: Diagnosis not present

## 2014-05-20 DIAGNOSIS — D689 Coagulation defect, unspecified: Secondary | ICD-10-CM | POA: Diagnosis not present

## 2014-05-20 DIAGNOSIS — N185 Chronic kidney disease, stage 5: Secondary | ICD-10-CM | POA: Diagnosis not present

## 2014-05-20 DIAGNOSIS — R1319 Other dysphagia: Secondary | ICD-10-CM | POA: Diagnosis not present

## 2014-05-20 DIAGNOSIS — F319 Bipolar disorder, unspecified: Secondary | ICD-10-CM | POA: Diagnosis not present

## 2014-05-20 DIAGNOSIS — I10 Essential (primary) hypertension: Secondary | ICD-10-CM | POA: Diagnosis not present

## 2014-05-20 DIAGNOSIS — R4182 Altered mental status, unspecified: Secondary | ICD-10-CM | POA: Diagnosis not present

## 2014-05-20 DIAGNOSIS — M6281 Muscle weakness (generalized): Secondary | ICD-10-CM | POA: Diagnosis not present

## 2014-05-20 DIAGNOSIS — R41841 Cognitive communication deficit: Secondary | ICD-10-CM | POA: Diagnosis not present

## 2014-05-20 DIAGNOSIS — F99 Mental disorder, not otherwise specified: Secondary | ICD-10-CM | POA: Diagnosis not present

## 2014-05-20 DIAGNOSIS — Z111 Encounter for screening for respiratory tuberculosis: Secondary | ICD-10-CM | POA: Diagnosis not present

## 2014-05-20 DIAGNOSIS — R488 Other symbolic dysfunctions: Secondary | ICD-10-CM | POA: Diagnosis not present

## 2014-05-20 DIAGNOSIS — J45909 Unspecified asthma, uncomplicated: Secondary | ICD-10-CM | POA: Diagnosis not present

## 2014-05-20 DIAGNOSIS — E875 Hyperkalemia: Secondary | ICD-10-CM | POA: Diagnosis not present

## 2014-05-20 DIAGNOSIS — D509 Iron deficiency anemia, unspecified: Secondary | ICD-10-CM | POA: Diagnosis not present

## 2014-05-20 DIAGNOSIS — R2689 Other abnormalities of gait and mobility: Secondary | ICD-10-CM | POA: Diagnosis not present

## 2014-05-20 DIAGNOSIS — J9601 Acute respiratory failure with hypoxia: Secondary | ICD-10-CM | POA: Diagnosis not present

## 2014-05-20 DIAGNOSIS — N186 End stage renal disease: Secondary | ICD-10-CM | POA: Diagnosis not present

## 2014-05-20 DIAGNOSIS — E46 Unspecified protein-calorie malnutrition: Secondary | ICD-10-CM | POA: Diagnosis not present

## 2014-05-20 DIAGNOSIS — F0391 Unspecified dementia with behavioral disturbance: Secondary | ICD-10-CM | POA: Diagnosis not present

## 2014-05-20 DIAGNOSIS — R278 Other lack of coordination: Secondary | ICD-10-CM | POA: Diagnosis not present

## 2014-05-20 DIAGNOSIS — D631 Anemia in chronic kidney disease: Secondary | ICD-10-CM | POA: Diagnosis not present

## 2014-05-20 DIAGNOSIS — Z992 Dependence on renal dialysis: Secondary | ICD-10-CM | POA: Diagnosis not present

## 2014-05-20 DIAGNOSIS — G9340 Encephalopathy, unspecified: Secondary | ICD-10-CM | POA: Diagnosis not present

## 2014-05-20 DIAGNOSIS — G9349 Other encephalopathy: Secondary | ICD-10-CM | POA: Diagnosis not present

## 2014-05-20 LAB — CBC
HCT: 30.5 % — ABNORMAL LOW (ref 36.0–46.0)
Hemoglobin: 9.4 g/dL — ABNORMAL LOW (ref 12.0–15.0)
MCH: 30 pg (ref 26.0–34.0)
MCHC: 30.8 g/dL (ref 30.0–36.0)
MCV: 97.4 fL (ref 78.0–100.0)
Platelets: 162 10*3/uL (ref 150–400)
RBC: 3.13 MIL/uL — ABNORMAL LOW (ref 3.87–5.11)
RDW: 15.7 % — ABNORMAL HIGH (ref 11.5–15.5)
WBC: 5.6 10*3/uL (ref 4.0–10.5)

## 2014-05-20 LAB — BASIC METABOLIC PANEL
Anion gap: 11 (ref 5–15)
BUN: 15 mg/dL (ref 6–23)
CO2: 26 mmol/L (ref 19–32)
Calcium: 10.6 mg/dL — ABNORMAL HIGH (ref 8.4–10.5)
Chloride: 99 mEq/L (ref 96–112)
Creatinine, Ser: 2.65 mg/dL — ABNORMAL HIGH (ref 0.50–1.10)
GFR calc Af Amer: 22 mL/min — ABNORMAL LOW (ref >90)
GFR calc non Af Amer: 19 mL/min — ABNORMAL LOW (ref >90)
Glucose, Bld: 81 mg/dL (ref 70–99)
Potassium: 4 mmol/L (ref 3.5–5.1)
Sodium: 136 mmol/L (ref 135–145)

## 2014-05-20 LAB — CULTURE, BLOOD (ROUTINE X 2)
Culture: NO GROWTH
Culture: NO GROWTH

## 2014-05-20 LAB — HEPATITIS B SURFACE ANTIBODY,QUALITATIVE: Hep B S Ab: NONREACTIVE — AB

## 2014-05-20 LAB — HEPATITIS B CORE ANTIBODY, TOTAL: Hep B Core Total Ab: NONREACTIVE

## 2014-05-20 MED ORDER — SODIUM CHLORIDE 0.9 % IV SOLN: 250.0000 mg | INTRAVENOUS | Status: DC

## 2014-05-20 MED ORDER — DSS 100 MG PO CAPS: 100.0000 mg | ORAL_CAPSULE | Freq: Two times a day (BID) | ORAL | Status: DC

## 2014-05-20 MED ORDER — ACETAMINOPHEN 325 MG PO TABS: 650.0000 mg | ORAL_TABLET | ORAL | Status: DC | PRN

## 2014-05-20 MED ORDER — DIVALPROEX SODIUM 500 MG PO DR TAB: 500.0000 mg | DELAYED_RELEASE_TABLET | Freq: Two times a day (BID) | ORAL | Status: DC

## 2014-05-20 MED ORDER — NEPRO/CARBSTEADY PO LIQD: 237.0000 mL | ORAL | Status: DC | PRN

## 2014-05-20 MED ORDER — NICOTINE 21 MG/24HR TD PT24: 21.0000 mg | MEDICATED_PATCH | Freq: Every day | TRANSDERMAL | Status: DC

## 2014-05-20 MED ORDER — DARBEPOETIN ALFA 60 MCG/0.3ML IJ SOSY: 60.0000 ug | PREFILLED_SYRINGE | INTRAMUSCULAR | Status: DC

## 2014-05-20 MED ORDER — METOPROLOL TARTRATE 25 MG PO TABS: 12.5000 mg | ORAL_TABLET | Freq: Two times a day (BID) | ORAL | Status: DC

## 2014-05-20 MED ORDER — BISACODYL 5 MG PO TBEC: 5.0000 mg | DELAYED_RELEASE_TABLET | Freq: Every day | ORAL | Status: DC | PRN

## 2014-05-20 MED ORDER — RENA-VITE PO TABS: 1.0000 | ORAL_TABLET | Freq: Every day | ORAL | Status: DC

## 2014-05-20 NOTE — Progress Notes (Signed)
[facility] ROUNDING NOTE   Subjective:   Interval History: speech very slow although alert and orientated and appropriate  Objective:  Vital signs in last 24 hours:  Temp:  [97.2 F (36.2 C)-98.2 F (36.8 C)] 97.2 F (36.2 C) (01/12 0448) Pulse Rate:  [86-90] 90 (01/12 0448) Resp:  [16-20] 16 (01/12 0448) BP: (99-133)/(52-79) 133/79 mmHg (01/12 0448) SpO2:  [96 %-97 %] 97 % (01/12 0448)  Weight change:  Filed Weights   05/16/14 1324 05/19/14 0710 05/19/14 1122  Weight: 88.8 kg (195 lb 12.3 oz) 86.5 kg (190 lb 11.2 oz) 82.9 kg (182 lb 12.2 oz)    Intake/Output: I/O last 3 completed shifts: In: 42 [P.O.:940; I.V.:3] Out: 6600 [Urine:3100; Other:3500]   Intake/Output this shift:     CVS- RRR RS- CTA ABD- BS present soft non-distended EXT- no edema   Basic Metabolic Panel:  Recent Labs Lab 05/15/14 1700 05/16/14 0425 05/17/14 0600 05/19/14 0726 05/20/14 0041  NA 142 143 139 139 136  K 3.2* 3.7 4.0 4.0 4.0  CL 106 108 102 105 99  CO2 28 30 30 29 26   GLUCOSE 78 118* 74 84 81  BUN 33* 35* 20 31* 15  CREATININE 4.05* 4.40* 3.50* 4.06* 2.65*  CALCIUM 9.6 9.8 9.5 10.0 10.6*  MG 2.4  --   --   --   --   PHOS 4.7*  --   --  5.0*  --     Liver Function Tests:  Recent Labs Lab 05/13/14 1722 05/14/14 0700 05/15/14 0443 05/15/14 1700 05/19/14 0726  AST 13 11 12   --   --   ALT 11 9 8   --   --   ALKPHOS 55 53 45  --   --   BILITOT 1.1 1.1 0.6  --   --   PROT 6.9 6.0 5.4*  --   --   ALBUMIN 3.4* 2.8* 2.5* 2.3* 2.4*    Recent Labs Lab 05/13/14 1810  LIPASE 25    Recent Labs Lab 05/13/14 1808 05/14/14 2200  AMMONIA 25 11    CBC:  Recent Labs Lab 05/13/14 1722  05/14/14 0700 05/15/14 0443 05/16/14 0425 05/17/14 0600 05/19/14 0727 05/20/14 0041  WBC 4.3  --  4.4 5.1 6.2 4.6 4.8 5.6  NEUTROABS 2.9  --  3.2  --   --   --   --   --   HGB 9.4*  < > 9.2* 8.2* 7.8* 8.6* 8.5* 9.4*  HCT 30.7*  < > 29.9* 26.8* 26.4* 28.3* 27.9*  30.5*  MCV 97.2  --  100.7* 94.4 98.1 101.4* 96.5 97.4  PLT 165  --  146* 130* 120* 98* 144* 162  < > = values in this interval not displayed.  Cardiac Enzymes: No results for input(s): CKTOTAL, CKMB, CKMBINDEX, TROPONINI in the last 168 hours.  BNP: Invalid input(s): POCBNP  CBG:  Recent Labs Lab 05/16/14 0832 05/16/14 1150 05/16/14 1605 05/16/14 2002 05/17/14 1705  GLUCAP 83 84 81 84 94    Microbiology: Results for orders placed or performed during the hospital encounter of 05/13/14  MRSA PCR Screening     Status: None   Collection Time: 05/13/14 11:29 PM  Result Value Ref Range Status   MRSA by PCR NEGATIVE NEGATIVE Final    Comment:        The GeneXpert MRSA Assay (FDA approved for NASAL specimens only), is one component of a comprehensive MRSA colonization surveillance program. It is not intended to diagnose  MRSA infection nor to guide or monitor treatment for MRSA infections.   Culture, blood (routine x 2)     Status: None   Collection Time: 05/14/14  5:40 AM  Result Value Ref Range Status   Specimen Description BLOOD LEFT HAND  Final   Special Requests BOTTLES DRAWN AEROBIC AND ANAEROBIC 10CC EACH  Final   Culture   Final    NO GROWTH 5 DAYS Performed at [facility]    Report Status 05/20/2014 FINAL  Final  Culture, blood (routine x 2)     Status: None   Collection Time: 05/14/14  7:00 AM  Result Value Ref Range Status   Specimen Description BLOOD LEFT ARM  Final   Special Requests BOTTLES DRAWN AEROBIC ONLY [place]  Final   Culture   Final    NO GROWTH 5 DAYS Performed at [facility]    Report Status 05/20/2014 FINAL  Final  Urine culture     Status: None   Collection Time: 05/14/14  8:30 AM  Result Value Ref Range Status   Specimen Description URINE, CATHETERIZED  Final   Special Requests NONE  Final   Colony Count NO GROWTH Performed at [facility]   Final   Culture NO GROWTH Performed at [facility]    Final   Report Status 05/15/2014 FINAL  Final    Coagulation Studies: No results for input(s): LABPROT, INR in the last 72 hours.  Urinalysis: No results for input(s): COLORURINE, LABSPEC, PHURINE, GLUCOSEU, HGBUR, BILIRUBINUR, KETONESUR, PROTEINUR, UROBILINOGEN, NITRITE, LEUKOCYTESUR in the last 72 hours.  Invalid input(s): APPERANCEUR    Imaging: No results found.   Medications:     . antiseptic oral rinse  7 mL Mouth Rinse q12n4p  . chlorhexidine  15 mL Mouth Rinse BID  . darbepoetin (ARANESP) injection - DIALYSIS  60 mcg Intravenous Q Thu-HD  . divalproex  500 mg Oral Q12H  . docusate sodium  100 mg Oral BID  . ferric gluconate (FERRLECIT/NULECIT) IV  250 mg Intravenous Q T,Th,S,Su  . multivitamin  1 tablet Oral QHS  . nicotine  21 mg Transdermal Daily  . sodium chloride  3 mL Intravenous Q12H  . ziprasidone  60 mg Oral QHS   sodium chloride, sodium chloride, acetaminophen, albuterol, bisacodyl, feeding supplement (NEPRO CARB STEADY), heparin, lidocaine (PF), lidocaine-prilocaine, pentafluoroprop-tetrafluoroeth, phenol  Assessment/ Plan:  1. End-stage renal disease from progressive chronic kidney disease: Hemodialysis started on 1/6 for management of multiple metabolic abnormalities and possible uremic encephalopathy. Hemodialysis ordered for tomorrow via her right transposed brachiobasilic fistula-4 hours and challenge dry weight. Discontinue Foley catheter and ambulate in hallways with assistance. Process initiated to get outpatient dialysis unit. 2. Metabolic encephalopathy: Suspected to be from uremic encephalopathy/metabolic acidosis-improved with hemodialysis  3. Nutrition: Start renal vitamin and protein supplementation as needed. 4. Anemia of chronic disease: Start intravenous iron for iron deficiency and ESA. 5. Metabolic bone disease: Phosphorus levels acceptable-currently not on binders , will monitor phosphorus levels with labs tomorrow.     LOS:  7 Jennifer Lawson @TODAY @11 :48 AM

## 2014-05-20 NOTE — Discharge Summary (Signed)
Discharge Summary  Jennifer Lawson MR#: [id number]  DOB:12-27-1960  Date of Admission: 05/13/2014 Date of Discharge: 05/20/2014  Patient's PCP: [facility]  Attending Physician:, A  Consults: Treatment Team:  Elmarie Shiley, MD, Renal PCCM Neurology  Discharge Diagnoses: Principal Problem:   Acute encephalopathy Active Problems:   Bipolar 1 disorder   CKD (chronic kidney disease) stage 5, GFR less than 15 ml/min   Mixed acid base balance disorder   Salicylate overdose   Acute respiratory failure with hypoxia   Encounter for central line placement   Brief Admitting History and Physical On admission: "Jennifer Lawson is a 54 y.o. female with history of chronic kidney disease stage V, hypertension, bipolar disorder, chronic anemia was brought to the ER after patient was found to be slumped in her toilet. As per the ER physician patient has been feeling increasingly drowsy over the last 3 days and today around 4:30 PM patient's daughter found her slumped in a toddler and was brought to the ER. Patient is very encephalopathic and lethargic. CT head did not show the acute and on initial presentation neurologist was consulted and neurologist has recommended MRI brain and to check for other causes including urine drug screen and medications. Salicylates levels came a little bit higher and 23 and ABG shows a pH of 7.2. I did discuss with pulmonary critical care about the ABG findings and pulmonary critical care feels that patient's ABG is showing a mixed picture of metabolic acidosis and alkalosis which could be from salicylate. I did discuss with on-call nephrologist Dr. Lorrene Reid and at this time since her levels are not very high there is no indication for dialysis but we will need to serial check salicylate levels."  Discharge Medications   Medication List    STOP taking these medications        azithromycin 250 MG tablet  Commonly known as:  ZITHROMAX Z-PAK     BC  HEADACHE POWDER PO     diphenhydrAMINE 25 MG tablet  Commonly known as:  SOMINEX     doxepin 10 MG capsule  Commonly known as:  SINEQUAN     furosemide 80 MG tablet  Commonly known as:  LASIX     temazepam 7.5 MG capsule  Commonly known as:  RESTORIL      TAKE these medications        acetaminophen 325 MG tablet  Commonly known as:  TYLENOL  Take 2 tablets (650 mg total) by mouth every 4 (four) hours as needed for fever, headache or mild pain.     albuterol (2.5 MG/3ML) 0.083% nebulizer solution  Commonly known as:  PROVENTIL  Take 3 mLs (2.5 mg total) by nebulization every 4 (four) hours as needed for wheezing or shortness of breath.     bisacodyl 5 MG EC tablet  Commonly known as:  DULCOLAX  Take 1 tablet (5 mg total) by mouth daily as needed for moderate constipation.     Darbepoetin Alfa 60 MCG/0.3ML Sosy injection  Commonly known as:  ARANESP  Inject 0.3 mLs (60 mcg total) into the vein every Thursday with hemodialysis.     divalproex 500 MG DR tablet  Commonly known as:  DEPAKOTE  Take 1 tablet (500 mg total) by mouth every 12 (twelve) hours.     DSS 100 MG Caps  Take 100 mg by mouth 2 (two) times daily.     feeding supplement (NEPRO CARB STEADY) Liqd  Take 237 mLs by mouth as needed (  missed meal during dialysis.).     ferric gluconate 250 mg in sodium chloride 0.9 % 100 mL  Inject 250 mg into the vein every Tuesday, Thursday, Saturday, and Sunday.     Fluticasone-Salmeterol 100-50 MCG/DOSE Aepb  Commonly known as:  ADVAIR  Inhale 1 puff into the lungs 2 (two) times daily. For asthma     ICY HOT EX  Apply 1 application topically daily as needed (back pain).     metoprolol tartrate 25 MG tablet  Commonly known as:  LOPRESSOR  Take 0.5 tablets (12.5 mg total) by mouth 2 (two) times daily. For HTN     multivitamin Tabs tablet  Take 1 tablet by mouth at bedtime.     nicotine 21 mg/24hr patch  Commonly known as:  NICODERM CQ - dosed in mg/24 hours  Place  1 patch (21 mg total) onto the skin daily.     simvastatin 10 MG tablet  Commonly known as:  ZOCOR  Take 1 tablet (10 mg total) by mouth at bedtime. For high cholesterol/fats     ziprasidone 60 MG capsule  Commonly known as:  GEODON  Take 1 capsule (60 mg total) by mouth 2 (two) times daily with a meal. For mood control        Hospital Course: Acute encephalopathy Present on Admission:  . Acute encephalopathy . Bipolar 1 disorder . CKD (chronic kidney disease) stage 5, GFR less than 15 ml/min . Mixed acid base balance disorder . Salicylate overdose   Acute encephalopathy -Improving, patient is currently alert and oriented.  -Neurology was also consulted during hospitalization who felt that her altered mental status is multifactorial and related to metabolic, renal etiology with polypharmacy playing the role.  -MRI of the brain was negative for acute infarct. -May consider decreasing depakote and ziprasidone if patient has any episodes of lethargy.  Acute respiratory failure with hypoxia -Currently stable, O2 sats 95% on room air. -Resolved.  End-stage renal disease from progressive CKD stage V -Renal service was consulted due to concerns of uremia causing encephalopathy. The patient had fistula placed in 06/2013. Hemodialysis was started on Wednesday 05/14/13 for management of multiple metabolic abnormalities.  - She will need CLIP process due to new hemodialysis, prior to discharge to SNF.  Anion gap metabolic acidosis - Likely from uremia and ESRD, improved with hemodialysis  Bipolar 1 disorder - Currently stable, takes Restoril, Geodon, Depakote at home - Continue Depakote for now, to have Depakote levels checked on 05/23/2014 and have results sent to PCP to determine if Depakote levels need to be changed.  Consultants: PCCM  Nephrology  Neurology  Procedures: Intubation  Hemodialysis   Antibiotics:  None  Physical Exam: General: Awake, Oriented to self  and location, No acute distress. HEENT: EOMI. Neck: Supple CV: S1 and S2 Lungs: Clear to ascultation bilaterally Abdomen: Soft, Nontender, Nondistended, +bowel sounds. Ext: Good pulses. Trace edema.  Day of Discharge BP 133/79 mmHg  Pulse 90  Temp(Src) 97.2 F (36.2 C) (Oral)  Resp 16  Ht 5' 2" (1.575 m)  Wt 82.9 kg (182 lb 12.2 oz)  BMI 33.42 kg/m2  SpO2 97%  LMP 05/09/2008  Results for orders placed or performed during the hospital encounter of 05/13/14 (from the past 48 hour(s))  Renal function panel     Status: Abnormal   Collection Time: 05/19/14  7:26 AM  Result Value Ref Range   Sodium 139 135 - 145 mmol/L    Comment: Please note change in reference range.  Potassium 4.0 3.5 - 5.1 mmol/L    Comment: Please note change in reference range.   Chloride 105 96 - 112 mEq/L   CO2 29 19 - 32 mmol/L   Glucose, Bld 84 70 - 99 mg/dL   BUN 31 (H) 6 - 23 mg/dL   Creatinine, Ser 4.06 (H) 0.50 - 1.10 mg/dL   Calcium 10.0 8.4 - 10.5 mg/dL   Phosphorus 5.0 (H) 2.3 - 4.6 mg/dL   Albumin 2.4 (L) 3.5 - 5.2 g/dL   GFR calc non Af Amer 12 (L) >90 mL/min   GFR calc Af Amer 13 (L) >90 mL/min    Comment: (NOTE) The eGFR has been calculated using the CKD EPI equation. This calculation has not been validated in all clinical situations. eGFR's persistently <90 mL/min signify possible Chronic Kidney Disease.    Anion gap 5 5 - 15  CBC     Status: Abnormal   Collection Time: 05/19/14  7:27 AM  Result Value Ref Range   WBC 4.8 4.0 - 10.5 K/uL   RBC 2.89 (L) 3.87 - 5.11 MIL/uL   Hemoglobin 8.5 (L) 12.0 - 15.0 g/dL   HCT 27.9 (L) 36.0 - 46.0 %   MCV 96.5 78.0 - 100.0 fL   MCH 29.4 26.0 - 34.0 pg   MCHC 30.5 30.0 - 36.0 g/dL   RDW 15.8 (H) 11.5 - 15.5 %   Platelets 144 (L) 150 - 400 K/uL  Hepatitis B core antibody, total     Status: None   Collection Time: 05/19/14  8:21 AM  Result Value Ref Range   Hep B Core Total Ab NON REACTIVE NON REACTIVE    Comment: Performed at [facility]  Hepatitis B surface antibody     Status: Abnormal   Collection Time: 05/19/14  8:21 AM  Result Value Ref Range   Hep B S Ab Non Reactive (A) NEGATIVE    Comment: (NOTE)              Non Reactive: Inconsistent with immunity,                            less than 10 mIU/mL              Reactive:     Consistent with immunity,                            greater than 9.9 mIU/mL Performed At: [facility] [place], [place] [id number] Lindon Romp MD TD:[id number]   CBC     Status: Abnormal   Collection Time: 05/20/14 12:41 AM  Result Value Ref Range   WBC 5.6 4.0 - 10.5 K/uL   RBC 3.13 (L) 3.87 - 5.11 MIL/uL   Hemoglobin 9.4 (L) 12.0 - 15.0 g/dL   HCT 30.5 (L) 36.0 - 46.0 %   MCV 97.4 78.0 - 100.0 fL   MCH 30.0 26.0 - 34.0 pg   MCHC 30.8 30.0 - 36.0 g/dL   RDW 15.7 (H) 11.5 - 15.5 %   Platelets 162 150 - 400 K/uL  Basic metabolic panel     Status: Abnormal   Collection Time: 05/20/14 12:41 AM  Result Value Ref Range   Sodium 136 135 - 145 mmol/L    Comment: Please note change in reference range.   Potassium 4.0 3.5 - 5.1 mmol/L    Comment: Please  note change in reference range.   Chloride 99 96 - 112 mEq/L   CO2 26 19 - 32 mmol/L   Glucose, Bld 81 70 - 99 mg/dL   BUN 15 6 - 23 mg/dL    Comment: DELTA CHECK NOTED   Creatinine, Ser 2.65 (H) 0.50 - 1.10 mg/dL    Comment: DELTA CHECK NOTED   Calcium 10.6 (H) 8.4 - 10.5 mg/dL   GFR calc non Af Amer 19 (L) >90 mL/min   GFR calc Af Amer 22 (L) >90 mL/min    Comment: (NOTE) The eGFR has been calculated using the CKD EPI equation. This calculation has not been validated in all clinical situations. eGFR's persistently <90 mL/min signify possible Chronic Kidney Disease.    Anion gap 11 5 - 15    Ct Head (brain) Wo Contrast  05/13/2014   CLINICAL DATA:  Altered mental status, urinary retention  EXAM: CT HEAD WITHOUT CONTRAST  TECHNIQUE: Contiguous axial images were obtained from the base of the  skull through the vertex without intravenous contrast.  COMPARISON:  01/15/2014 and brain MRI a 01/16/2014  FINDINGS: No skull fracture is noted. Again noted mild mucosal thickening posterior aspect of left maxillary sinus. The sphenoid sinuses and mastoid air cells are unremarkable. Frontal sinuses are unremarkable.  No intracranial hemorrhage, mass effect or midline shift. No acute cortical infarction. No mass lesion is noted on this unenhanced scan. The gray and white-matter differentiation is preserved.  IMPRESSION: No acute intracranial abnormality. Stable mild mucosal thickening posterior aspect of the left maxillary sinus. No definite acute cortical infarction.   Electronically Signed   By: Lahoma Crocker M.D.   On: 05/13/2014 17:27   Mr Brain Wo Contrast  05/14/2014   CLINICAL DATA:  Increasing lethargy for 3 days , encephalopathy. Renal failure.  EXAM: MRI HEAD WITHOUT CONTRAST  TECHNIQUE: Multiplanar, multiecho pulse sequences of the brain and surrounding structures were obtained without intravenous contrast.  COMPARISON:  CT of the head May 13, 2014 and MRI of the head January 16, 2014  FINDINGS: Mild motion degraded examination.  The ventricles and sulci are normal. No abnormal parenchymal signal, mass lesions or mass of affect. No reduced diffusion to suggest acute ischemia. No susceptibility artifact to suggest hemorrhage. Mildly dolichoectatic intracranial vessels.  No abnormal extra-axial fluid collection. No abnormal calvarial bone marrow signal. Ocular globes and orbital contents are unremarkable though not tailored for evaluation. LEFT maxillary mucosal retention cyst without paranasal sinus air-fluid levels. The mastoid air cells appear well-aerated. No abnormal sellar expansion. Craniocervical junction is maintained. The patient is edentulous.  IMPRESSION: No acute intracranial process, specifically no evidence of acute ischemia.  Mildly dolicoectatic intracranial vessels can be seen with  chronic hypertension.   Electronically Signed   By: Elon Alas   On: 05/14/2014 05:19   Dg Chest Port 1 View  05/16/2014   CLINICAL DATA:  Intubation.  EXAM: PORTABLE CHEST - 1 VIEW  COMPARISON:  05/15/2014.  FINDINGS: Endotracheal tube 11 mm above the carina, proximal repositioning suggested. Left IJ line and NG tube in stable position. Persistent left lower lobe atelectasis and/or infiltrate. Tiny left pleural effusion cannot be excluded. No interim change. No pneumothorax. Stable cardiomegaly. No pulmonary venous congestion on today's exam. No acute bony abnormality.  IMPRESSION: 1. Endotracheal tube 11 mm above the carina, proximal repositioning suggested. Remaining lines and tubes in stable position. 2. Persistent left lower lobe atelectasis and/or infiltrate. Tiny left pleural effusion cannot be excluded. No interval change. 3.  Cardiomegaly.  Interim resolution of pulmonary venous congestion.   Electronically Signed   By: Marcello Moores  Register   On: 05/16/2014 07:42   Dg Chest Port 1 View  05/15/2014   CLINICAL DATA:  Acute respiratory failure.  Hypoxia.  EXAM: PORTABLE CHEST - 1 VIEW  COMPARISON:  05/14/2014.  FINDINGS: Endotracheal tube tip 2.8 cm above the carina. Left IJ line in stable position. Stable cardiomegaly. Mild pulmonary venous congestion noted on today's exam. Left lower lobe atelectasis and/or infiltrate, progressive from prior exam. Small left pleural effusion cannot be excluded. No pneumothorax. Surgical clips right axilla. No acute bony abnormality.  IMPRESSION: 1. Endotracheal tube tip 2.8 cm above the carina. Left IJ line in stable position. 2. Cardiomegaly with mild pulmonary venous congestion. 3. Progressive left lower lobe atelectasis and/or infiltrate. Small left pleural effusion cannot be excluded.   Electronically Signed   By: Marcello Moores  Register   On: 05/15/2014 07:39   Dg Chest Portable 1 View  05/14/2014   CLINICAL DATA:  Acute respiratory failure. Intubation and central line  placement.  EXAM: PORTABLE CHEST - 1 VIEW  COMPARISON:  05/14/2014  FINDINGS: Endotracheal tube tip is approximately 1 cm above the carina. Nasogastric tube is seen entering the stomach. A new left internal jugular central venous catheter is seen with tip overlying the distal SVC. No pneumothorax visualized.  Mild linear opacity in the left retrocardiac lung back is this consistent with mild subsegmental atelectasis. No evidence of pulmonary consolidation or pleural effusion.  IMPRESSION: Endotracheal tube tip approximately 1 cm above carina.  Left jugular central venous catheter and nasogastric tube in appropriate position. No pneumothorax visualized.  Mild left basilar atelectasis.   Electronically Signed   By: Earle Gell M.D.   On: 05/14/2014 13:11   Dg Chest Port 1 View  05/14/2014   CLINICAL DATA:  Fever, shortness of breath  EXAM: PORTABLE CHEST - 1 VIEW  COMPARISON:  04/12/2014  FINDINGS: Shallow inspiration. Cardiac enlargement with mild prominence of pulmonary vascularity suggesting mild vascular congestion. No focal airspace disease or edema. No blunting of costophrenic angles. No pneumothorax. Mediastinal contours appear intact.  IMPRESSION: Cardiac enlargement with mild pulmonary vascular congestion. No edema or consolidation.   Electronically Signed   By: Lucienne Capers M.D.   On: 05/14/2014 05:30   Dg Knee Left Port  05/15/2014   CLINICAL DATA:  Recent fall in bathroom, patellar bruising and pain, initial encounter  EXAM: PORTABLE LEFT KNEE - 1-2 VIEW  COMPARISON:  None.  FINDINGS: Degenerative changes of the left knee joint are noted particularly in the medial joint space. No acute fracture is seen. A small joint effusion is noted.  IMPRESSION: Small joint effusion.  Degenerative changes.   Electronically Signed   By: Inez Catalina M.D.   On: 05/15/2014 15:01   Dg Tibia/fibula Left Port  05/15/2014   CLINICAL DATA:  Recent fall in bathroom with leg pain and hematoma, initial encounter  EXAM:  PORTABLE LEFT TIBIA AND FIBULA - 2 VIEW  COMPARISON:  None.  FINDINGS: No acute fracture or dislocation is noted. No gross soft tissue abnormality is seen.  IMPRESSION: No acute abnormality noted.   Electronically Signed   By: Inez Catalina M.D.   On: 05/15/2014 15:00   Dg Femur Port 1v Left  05/15/2014   CLINICAL DATA:  Recent fall in bathroom with leg pain  EXAM: DG FEMUR 1V PORT*L*  COMPARISON:  None.  FINDINGS: There is no evidence of fracture or other focal  bone lesions. Soft tissues are unremarkable.  IMPRESSION: No acute abnormality is noted on this limited exam.   Electronically Signed   By: Inez Catalina M.D.   On: 05/15/2014 15:02     Disposition: SNF  Diet: Renal diet  Activity: Resume as tolerated per PT.   Follow-up Appts: Discharge Instructions    Discharge diet:    Complete by:  As directed   Diet renal modified with 1200 ml fluid restriction     Discharge instructions    Complete by:  As directed   Please followup with PCP ([facility]) in 1 week. Please check depakote level on 05/23/2014 and have results sent to PCP to determine if Depakote levels need to be changed.     Increase activity slowly    Complete by:  As directed            TESTS THAT NEED FOLLOW-UP None  Time spent on discharge, talking to the patient, and coordinating care: 35 mins.   Signed: Bynum Bellows, MD 05/20/2014, 9:43 AM

## 2014-05-20 NOTE — Progress Notes (Signed)
Pt prepared for d/c to SNF. IV d/c'd. Skin intact except as most recently charted. Vitals are stable. Report called to receiving facility. Pt transported by ambulance service.

## 2014-05-20 NOTE — Clinical Social Work Placement (Signed)
Clinical Social Work Department CLINICAL SOCIAL WORK PLACEMENT NOTE 05/20/2014  Patient:  Jennifer Lawson, Jennifer Lawson  Account Number:  [id number] Admit date:  05/13/2014  Clinical Social Worker:  Kemper Durie, [place]  Date/time:  05/19/2014 05:04 PM  Clinical Social Work is seeking post-discharge placement for this patient at the following level of care:   SKILLED NURSING   (*CSW will update this form in Epic as items are completed)   05/19/2014  Patient/family provided with Miguel Barrera Department of Clinical Social Work's list of facilities offering this level of care within the geographic area requested by the patient (or if unable, by the patient's family).  05/19/2014  Patient/family informed of their freedom to choose among providers that offer the needed level of care, that participate in Medicare, Medicaid or managed care program needed by the patient, have an available bed and are willing to accept the patient.  05/19/2014  Patient/family informed of [facility]' ownership interest in [facility], as well as of the fact that they are under no obligation to receive care at this facility.  PASARR submitted to EDS on  PASARR number received on   FL2 transmitted to all facilities in geographic area requested by pt/family on  05/19/2014 FL2 transmitted to all facilities within larger geographic area on   Patient informed that his/her managed care company has contracts with or will negotiate with  certain facilities, including the following:     Patient/family informed of bed offers received:  05/19/2014 Patient chooses bed at [facility] Physician recommends and patient chooses bed at    Patient to be transferred to [facility] on  05/20/2014 Patient to be transferred to facility by Ambulance Patient and family notified of transfer on 05/20/2014 Name of family member notified:  Joesph Fillers  The following physician request were entered in  Epic:   Additional Comments:    Per MD patient ready for DC to [facility], patient, patient's family Joesph Fillers and patient aware of DC), and facility notified of DC. RN given number for report. DC packet on chart. AMbul[facility] transport requested for patient. CSW signing off.    Liz Beach MSW, [place], [place], [phone]

## 2014-05-20 NOTE — Progress Notes (Signed)
05/20/2014 3:20 PM Hemodialysis Outpatient Note; this patient has been accepted at the [facility] on [address] on a Tuesday, Thursday and Saturday 2nd shift schedule. The center is able to begin treatment on Thursday January 14th, please have patient there at 10:30 AM. Social worker Marshell Levan has been made aware.  Thank you.Jennifer Lawson

## 2014-05-22 DIAGNOSIS — Z111 Encounter for screening for respiratory tuberculosis: Secondary | ICD-10-CM | POA: Diagnosis not present

## 2014-05-22 DIAGNOSIS — D631 Anemia in chronic kidney disease: Secondary | ICD-10-CM | POA: Diagnosis not present

## 2014-05-22 DIAGNOSIS — N186 End stage renal disease: Secondary | ICD-10-CM | POA: Diagnosis not present

## 2014-05-22 DIAGNOSIS — D689 Coagulation defect, unspecified: Secondary | ICD-10-CM | POA: Diagnosis not present

## 2014-05-22 DIAGNOSIS — D509 Iron deficiency anemia, unspecified: Secondary | ICD-10-CM | POA: Diagnosis not present

## 2014-05-24 DIAGNOSIS — N186 End stage renal disease: Secondary | ICD-10-CM | POA: Diagnosis not present

## 2014-05-24 DIAGNOSIS — D509 Iron deficiency anemia, unspecified: Secondary | ICD-10-CM | POA: Diagnosis not present

## 2014-05-24 DIAGNOSIS — D631 Anemia in chronic kidney disease: Secondary | ICD-10-CM | POA: Diagnosis not present

## 2014-05-24 DIAGNOSIS — Z111 Encounter for screening for respiratory tuberculosis: Secondary | ICD-10-CM | POA: Diagnosis not present

## 2014-05-24 DIAGNOSIS — D689 Coagulation defect, unspecified: Secondary | ICD-10-CM | POA: Diagnosis not present

## 2014-05-27 DIAGNOSIS — I10 Essential (primary) hypertension: Secondary | ICD-10-CM | POA: Diagnosis not present

## 2014-05-27 DIAGNOSIS — D509 Iron deficiency anemia, unspecified: Secondary | ICD-10-CM | POA: Diagnosis not present

## 2014-05-27 DIAGNOSIS — G9340 Encephalopathy, unspecified: Secondary | ICD-10-CM | POA: Diagnosis not present

## 2014-05-27 DIAGNOSIS — E46 Unspecified protein-calorie malnutrition: Secondary | ICD-10-CM | POA: Diagnosis not present

## 2014-05-27 DIAGNOSIS — N186 End stage renal disease: Secondary | ICD-10-CM | POA: Diagnosis not present

## 2014-05-27 DIAGNOSIS — F0391 Unspecified dementia with behavioral disturbance: Secondary | ICD-10-CM | POA: Diagnosis not present

## 2014-05-27 DIAGNOSIS — J45909 Unspecified asthma, uncomplicated: Secondary | ICD-10-CM | POA: Diagnosis not present

## 2014-05-27 DIAGNOSIS — D689 Coagulation defect, unspecified: Secondary | ICD-10-CM | POA: Diagnosis not present

## 2014-05-27 DIAGNOSIS — F319 Bipolar disorder, unspecified: Secondary | ICD-10-CM | POA: Diagnosis not present

## 2014-05-27 DIAGNOSIS — Z111 Encounter for screening for respiratory tuberculosis: Secondary | ICD-10-CM | POA: Diagnosis not present

## 2014-05-27 DIAGNOSIS — D631 Anemia in chronic kidney disease: Secondary | ICD-10-CM | POA: Diagnosis not present

## 2014-05-29 DIAGNOSIS — D631 Anemia in chronic kidney disease: Secondary | ICD-10-CM | POA: Diagnosis not present

## 2014-05-29 DIAGNOSIS — D689 Coagulation defect, unspecified: Secondary | ICD-10-CM | POA: Diagnosis not present

## 2014-05-29 DIAGNOSIS — D509 Iron deficiency anemia, unspecified: Secondary | ICD-10-CM | POA: Diagnosis not present

## 2014-05-29 DIAGNOSIS — Z111 Encounter for screening for respiratory tuberculosis: Secondary | ICD-10-CM | POA: Diagnosis not present

## 2014-05-29 DIAGNOSIS — N186 End stage renal disease: Secondary | ICD-10-CM | POA: Diagnosis not present

## 2014-05-31 DIAGNOSIS — Z111 Encounter for screening for respiratory tuberculosis: Secondary | ICD-10-CM | POA: Diagnosis not present

## 2014-05-31 DIAGNOSIS — N186 End stage renal disease: Secondary | ICD-10-CM | POA: Diagnosis not present

## 2014-05-31 DIAGNOSIS — D631 Anemia in chronic kidney disease: Secondary | ICD-10-CM | POA: Diagnosis not present

## 2014-05-31 DIAGNOSIS — D509 Iron deficiency anemia, unspecified: Secondary | ICD-10-CM | POA: Diagnosis not present

## 2014-05-31 DIAGNOSIS — D689 Coagulation defect, unspecified: Secondary | ICD-10-CM | POA: Diagnosis not present

## 2014-06-05 DIAGNOSIS — N186 End stage renal disease: Secondary | ICD-10-CM | POA: Diagnosis not present

## 2014-06-05 DIAGNOSIS — D509 Iron deficiency anemia, unspecified: Secondary | ICD-10-CM | POA: Diagnosis not present

## 2014-06-05 DIAGNOSIS — D631 Anemia in chronic kidney disease: Secondary | ICD-10-CM | POA: Diagnosis not present

## 2014-06-05 DIAGNOSIS — Z111 Encounter for screening for respiratory tuberculosis: Secondary | ICD-10-CM | POA: Diagnosis not present

## 2014-06-05 DIAGNOSIS — D689 Coagulation defect, unspecified: Secondary | ICD-10-CM | POA: Diagnosis not present

## 2014-06-07 DIAGNOSIS — D631 Anemia in chronic kidney disease: Secondary | ICD-10-CM | POA: Diagnosis not present

## 2014-06-07 DIAGNOSIS — D689 Coagulation defect, unspecified: Secondary | ICD-10-CM | POA: Diagnosis not present

## 2014-06-07 DIAGNOSIS — D509 Iron deficiency anemia, unspecified: Secondary | ICD-10-CM | POA: Diagnosis not present

## 2014-06-07 DIAGNOSIS — Z111 Encounter for screening for respiratory tuberculosis: Secondary | ICD-10-CM | POA: Diagnosis not present

## 2014-06-07 DIAGNOSIS — N186 End stage renal disease: Secondary | ICD-10-CM | POA: Diagnosis not present

## 2014-06-08 DIAGNOSIS — Z992 Dependence on renal dialysis: Secondary | ICD-10-CM | POA: Diagnosis not present

## 2014-06-08 DIAGNOSIS — N186 End stage renal disease: Secondary | ICD-10-CM | POA: Diagnosis not present

## 2014-06-10 DIAGNOSIS — D509 Iron deficiency anemia, unspecified: Secondary | ICD-10-CM | POA: Diagnosis not present

## 2014-06-10 DIAGNOSIS — D631 Anemia in chronic kidney disease: Secondary | ICD-10-CM | POA: Diagnosis not present

## 2014-06-10 DIAGNOSIS — F419 Anxiety disorder, unspecified: Secondary | ICD-10-CM | POA: Diagnosis not present

## 2014-06-10 DIAGNOSIS — D689 Coagulation defect, unspecified: Secondary | ICD-10-CM | POA: Diagnosis not present

## 2014-06-10 DIAGNOSIS — Z79899 Other long term (current) drug therapy: Secondary | ICD-10-CM | POA: Diagnosis not present

## 2014-06-10 DIAGNOSIS — N186 End stage renal disease: Secondary | ICD-10-CM | POA: Diagnosis not present

## 2014-06-10 DIAGNOSIS — N2581 Secondary hyperparathyroidism of renal origin: Secondary | ICD-10-CM | POA: Diagnosis not present

## 2014-06-10 DIAGNOSIS — R52 Pain, unspecified: Secondary | ICD-10-CM | POA: Diagnosis not present

## 2014-06-12 DIAGNOSIS — F329 Major depressive disorder, single episode, unspecified: Secondary | ICD-10-CM | POA: Diagnosis not present

## 2014-06-12 DIAGNOSIS — Z992 Dependence on renal dialysis: Secondary | ICD-10-CM | POA: Diagnosis not present

## 2014-06-12 DIAGNOSIS — E119 Type 2 diabetes mellitus without complications: Secondary | ICD-10-CM | POA: Diagnosis not present

## 2014-06-12 DIAGNOSIS — R52 Pain, unspecified: Secondary | ICD-10-CM | POA: Diagnosis not present

## 2014-06-12 DIAGNOSIS — N186 End stage renal disease: Secondary | ICD-10-CM | POA: Diagnosis not present

## 2014-06-12 DIAGNOSIS — N2581 Secondary hyperparathyroidism of renal origin: Secondary | ICD-10-CM | POA: Diagnosis not present

## 2014-06-12 DIAGNOSIS — Z9181 History of falling: Secondary | ICD-10-CM | POA: Diagnosis not present

## 2014-06-12 DIAGNOSIS — D689 Coagulation defect, unspecified: Secondary | ICD-10-CM | POA: Diagnosis not present

## 2014-06-12 DIAGNOSIS — R1319 Other dysphagia: Secondary | ICD-10-CM | POA: Diagnosis not present

## 2014-06-12 DIAGNOSIS — J45909 Unspecified asthma, uncomplicated: Secondary | ICD-10-CM | POA: Diagnosis not present

## 2014-06-12 DIAGNOSIS — K219 Gastro-esophageal reflux disease without esophagitis: Secondary | ICD-10-CM | POA: Diagnosis not present

## 2014-06-12 DIAGNOSIS — I120 Hypertensive chronic kidney disease with stage 5 chronic kidney disease or end stage renal disease: Secondary | ICD-10-CM | POA: Diagnosis not present

## 2014-06-12 DIAGNOSIS — D631 Anemia in chronic kidney disease: Secondary | ICD-10-CM | POA: Diagnosis not present

## 2014-06-12 DIAGNOSIS — F319 Bipolar disorder, unspecified: Secondary | ICD-10-CM | POA: Diagnosis not present

## 2014-06-12 DIAGNOSIS — D509 Iron deficiency anemia, unspecified: Secondary | ICD-10-CM | POA: Diagnosis not present

## 2014-06-12 DIAGNOSIS — J449 Chronic obstructive pulmonary disease, unspecified: Secondary | ICD-10-CM | POA: Diagnosis not present

## 2014-06-12 DIAGNOSIS — J9691 Respiratory failure, unspecified with hypoxia: Secondary | ICD-10-CM | POA: Diagnosis not present

## 2014-06-13 DIAGNOSIS — E119 Type 2 diabetes mellitus without complications: Secondary | ICD-10-CM | POA: Diagnosis not present

## 2014-06-13 DIAGNOSIS — Z9181 History of falling: Secondary | ICD-10-CM | POA: Diagnosis not present

## 2014-06-13 DIAGNOSIS — J45909 Unspecified asthma, uncomplicated: Secondary | ICD-10-CM | POA: Diagnosis not present

## 2014-06-13 DIAGNOSIS — K219 Gastro-esophageal reflux disease without esophagitis: Secondary | ICD-10-CM | POA: Diagnosis not present

## 2014-06-13 DIAGNOSIS — J449 Chronic obstructive pulmonary disease, unspecified: Secondary | ICD-10-CM | POA: Diagnosis not present

## 2014-06-13 DIAGNOSIS — J9691 Respiratory failure, unspecified with hypoxia: Secondary | ICD-10-CM | POA: Diagnosis not present

## 2014-06-13 DIAGNOSIS — R1319 Other dysphagia: Secondary | ICD-10-CM | POA: Diagnosis not present

## 2014-06-13 DIAGNOSIS — F329 Major depressive disorder, single episode, unspecified: Secondary | ICD-10-CM | POA: Diagnosis not present

## 2014-06-13 DIAGNOSIS — Z992 Dependence on renal dialysis: Secondary | ICD-10-CM | POA: Diagnosis not present

## 2014-06-13 DIAGNOSIS — I120 Hypertensive chronic kidney disease with stage 5 chronic kidney disease or end stage renal disease: Secondary | ICD-10-CM | POA: Diagnosis not present

## 2014-06-13 DIAGNOSIS — N186 End stage renal disease: Secondary | ICD-10-CM | POA: Diagnosis not present

## 2014-06-13 DIAGNOSIS — F319 Bipolar disorder, unspecified: Secondary | ICD-10-CM | POA: Diagnosis not present

## 2014-06-17 DIAGNOSIS — N2581 Secondary hyperparathyroidism of renal origin: Secondary | ICD-10-CM | POA: Diagnosis not present

## 2014-06-17 DIAGNOSIS — N186 End stage renal disease: Secondary | ICD-10-CM | POA: Diagnosis not present

## 2014-06-17 DIAGNOSIS — D689 Coagulation defect, unspecified: Secondary | ICD-10-CM | POA: Diagnosis not present

## 2014-06-17 DIAGNOSIS — R52 Pain, unspecified: Secondary | ICD-10-CM | POA: Diagnosis not present

## 2014-06-17 DIAGNOSIS — D631 Anemia in chronic kidney disease: Secondary | ICD-10-CM | POA: Diagnosis not present

## 2014-06-17 DIAGNOSIS — D509 Iron deficiency anemia, unspecified: Secondary | ICD-10-CM | POA: Diagnosis not present

## 2014-06-18 DIAGNOSIS — J449 Chronic obstructive pulmonary disease, unspecified: Secondary | ICD-10-CM | POA: Diagnosis not present

## 2014-06-18 DIAGNOSIS — R269 Unspecified abnormalities of gait and mobility: Secondary | ICD-10-CM | POA: Diagnosis not present

## 2014-06-18 DIAGNOSIS — J441 Chronic obstructive pulmonary disease with (acute) exacerbation: Secondary | ICD-10-CM | POA: Diagnosis not present

## 2014-06-18 DIAGNOSIS — N186 End stage renal disease: Secondary | ICD-10-CM | POA: Diagnosis not present

## 2014-06-18 DIAGNOSIS — M179 Osteoarthritis of knee, unspecified: Secondary | ICD-10-CM | POA: Diagnosis not present

## 2014-06-19 DIAGNOSIS — D509 Iron deficiency anemia, unspecified: Secondary | ICD-10-CM | POA: Diagnosis not present

## 2014-06-19 DIAGNOSIS — N186 End stage renal disease: Secondary | ICD-10-CM | POA: Diagnosis not present

## 2014-06-19 DIAGNOSIS — D631 Anemia in chronic kidney disease: Secondary | ICD-10-CM | POA: Diagnosis not present

## 2014-06-19 DIAGNOSIS — N2581 Secondary hyperparathyroidism of renal origin: Secondary | ICD-10-CM | POA: Diagnosis not present

## 2014-06-19 DIAGNOSIS — D689 Coagulation defect, unspecified: Secondary | ICD-10-CM | POA: Diagnosis not present

## 2014-06-19 DIAGNOSIS — R52 Pain, unspecified: Secondary | ICD-10-CM | POA: Diagnosis not present

## 2014-06-20 DIAGNOSIS — J9691 Respiratory failure, unspecified with hypoxia: Secondary | ICD-10-CM | POA: Diagnosis not present

## 2014-06-20 DIAGNOSIS — R1319 Other dysphagia: Secondary | ICD-10-CM | POA: Diagnosis not present

## 2014-06-20 DIAGNOSIS — N186 End stage renal disease: Secondary | ICD-10-CM | POA: Diagnosis not present

## 2014-06-20 DIAGNOSIS — F319 Bipolar disorder, unspecified: Secondary | ICD-10-CM | POA: Diagnosis not present

## 2014-06-20 DIAGNOSIS — K219 Gastro-esophageal reflux disease without esophagitis: Secondary | ICD-10-CM | POA: Diagnosis not present

## 2014-06-20 DIAGNOSIS — J45909 Unspecified asthma, uncomplicated: Secondary | ICD-10-CM | POA: Diagnosis not present

## 2014-06-20 DIAGNOSIS — I120 Hypertensive chronic kidney disease with stage 5 chronic kidney disease or end stage renal disease: Secondary | ICD-10-CM | POA: Diagnosis not present

## 2014-06-20 DIAGNOSIS — Z9181 History of falling: Secondary | ICD-10-CM | POA: Diagnosis not present

## 2014-06-20 DIAGNOSIS — E119 Type 2 diabetes mellitus without complications: Secondary | ICD-10-CM | POA: Diagnosis not present

## 2014-06-20 DIAGNOSIS — F329 Major depressive disorder, single episode, unspecified: Secondary | ICD-10-CM | POA: Diagnosis not present

## 2014-06-20 DIAGNOSIS — Z992 Dependence on renal dialysis: Secondary | ICD-10-CM | POA: Diagnosis not present

## 2014-06-20 DIAGNOSIS — J449 Chronic obstructive pulmonary disease, unspecified: Secondary | ICD-10-CM | POA: Diagnosis not present

## 2014-06-21 DIAGNOSIS — N2581 Secondary hyperparathyroidism of renal origin: Secondary | ICD-10-CM | POA: Diagnosis not present

## 2014-06-21 DIAGNOSIS — D631 Anemia in chronic kidney disease: Secondary | ICD-10-CM | POA: Diagnosis not present

## 2014-06-21 DIAGNOSIS — D509 Iron deficiency anemia, unspecified: Secondary | ICD-10-CM | POA: Diagnosis not present

## 2014-06-21 DIAGNOSIS — N186 End stage renal disease: Secondary | ICD-10-CM | POA: Diagnosis not present

## 2014-06-21 DIAGNOSIS — D689 Coagulation defect, unspecified: Secondary | ICD-10-CM | POA: Diagnosis not present

## 2014-06-21 DIAGNOSIS — R52 Pain, unspecified: Secondary | ICD-10-CM | POA: Diagnosis not present

## 2014-06-25 DIAGNOSIS — J9691 Respiratory failure, unspecified with hypoxia: Secondary | ICD-10-CM | POA: Diagnosis not present

## 2014-06-25 DIAGNOSIS — J449 Chronic obstructive pulmonary disease, unspecified: Secondary | ICD-10-CM | POA: Diagnosis not present

## 2014-06-25 DIAGNOSIS — R1319 Other dysphagia: Secondary | ICD-10-CM | POA: Diagnosis not present

## 2014-06-25 DIAGNOSIS — F329 Major depressive disorder, single episode, unspecified: Secondary | ICD-10-CM | POA: Diagnosis not present

## 2014-06-25 DIAGNOSIS — N186 End stage renal disease: Secondary | ICD-10-CM | POA: Diagnosis not present

## 2014-06-25 DIAGNOSIS — I120 Hypertensive chronic kidney disease with stage 5 chronic kidney disease or end stage renal disease: Secondary | ICD-10-CM | POA: Diagnosis not present

## 2014-06-25 DIAGNOSIS — Z9181 History of falling: Secondary | ICD-10-CM | POA: Diagnosis not present

## 2014-06-25 DIAGNOSIS — K219 Gastro-esophageal reflux disease without esophagitis: Secondary | ICD-10-CM | POA: Diagnosis not present

## 2014-06-25 DIAGNOSIS — J45909 Unspecified asthma, uncomplicated: Secondary | ICD-10-CM | POA: Diagnosis not present

## 2014-06-25 DIAGNOSIS — E119 Type 2 diabetes mellitus without complications: Secondary | ICD-10-CM | POA: Diagnosis not present

## 2014-06-25 DIAGNOSIS — Z992 Dependence on renal dialysis: Secondary | ICD-10-CM | POA: Diagnosis not present

## 2014-06-25 DIAGNOSIS — F319 Bipolar disorder, unspecified: Secondary | ICD-10-CM | POA: Diagnosis not present

## 2014-06-26 DIAGNOSIS — D509 Iron deficiency anemia, unspecified: Secondary | ICD-10-CM | POA: Diagnosis not present

## 2014-06-26 DIAGNOSIS — N186 End stage renal disease: Secondary | ICD-10-CM | POA: Diagnosis not present

## 2014-06-26 DIAGNOSIS — D631 Anemia in chronic kidney disease: Secondary | ICD-10-CM | POA: Diagnosis not present

## 2014-06-26 DIAGNOSIS — R52 Pain, unspecified: Secondary | ICD-10-CM | POA: Diagnosis not present

## 2014-06-26 DIAGNOSIS — N2581 Secondary hyperparathyroidism of renal origin: Secondary | ICD-10-CM | POA: Diagnosis not present

## 2014-06-26 DIAGNOSIS — D689 Coagulation defect, unspecified: Secondary | ICD-10-CM | POA: Diagnosis not present

## 2014-06-28 DIAGNOSIS — N2581 Secondary hyperparathyroidism of renal origin: Secondary | ICD-10-CM | POA: Diagnosis not present

## 2014-06-28 DIAGNOSIS — D689 Coagulation defect, unspecified: Secondary | ICD-10-CM | POA: Diagnosis not present

## 2014-06-28 DIAGNOSIS — R52 Pain, unspecified: Secondary | ICD-10-CM | POA: Diagnosis not present

## 2014-06-28 DIAGNOSIS — N186 End stage renal disease: Secondary | ICD-10-CM | POA: Diagnosis not present

## 2014-06-28 DIAGNOSIS — D631 Anemia in chronic kidney disease: Secondary | ICD-10-CM | POA: Diagnosis not present

## 2014-06-28 DIAGNOSIS — D509 Iron deficiency anemia, unspecified: Secondary | ICD-10-CM | POA: Diagnosis not present

## 2014-07-01 DIAGNOSIS — D689 Coagulation defect, unspecified: Secondary | ICD-10-CM | POA: Diagnosis not present

## 2014-07-01 DIAGNOSIS — N2581 Secondary hyperparathyroidism of renal origin: Secondary | ICD-10-CM | POA: Diagnosis not present

## 2014-07-01 DIAGNOSIS — R52 Pain, unspecified: Secondary | ICD-10-CM | POA: Diagnosis not present

## 2014-07-01 DIAGNOSIS — N186 End stage renal disease: Secondary | ICD-10-CM | POA: Diagnosis not present

## 2014-07-01 DIAGNOSIS — D509 Iron deficiency anemia, unspecified: Secondary | ICD-10-CM | POA: Diagnosis not present

## 2014-07-01 DIAGNOSIS — D631 Anemia in chronic kidney disease: Secondary | ICD-10-CM | POA: Diagnosis not present

## 2014-07-02 DIAGNOSIS — Z9181 History of falling: Secondary | ICD-10-CM | POA: Diagnosis not present

## 2014-07-02 DIAGNOSIS — R1319 Other dysphagia: Secondary | ICD-10-CM | POA: Diagnosis not present

## 2014-07-02 DIAGNOSIS — Z992 Dependence on renal dialysis: Secondary | ICD-10-CM | POA: Diagnosis not present

## 2014-07-02 DIAGNOSIS — K219 Gastro-esophageal reflux disease without esophagitis: Secondary | ICD-10-CM | POA: Diagnosis not present

## 2014-07-02 DIAGNOSIS — N186 End stage renal disease: Secondary | ICD-10-CM | POA: Diagnosis not present

## 2014-07-02 DIAGNOSIS — J45909 Unspecified asthma, uncomplicated: Secondary | ICD-10-CM | POA: Diagnosis not present

## 2014-07-02 DIAGNOSIS — F329 Major depressive disorder, single episode, unspecified: Secondary | ICD-10-CM | POA: Diagnosis not present

## 2014-07-02 DIAGNOSIS — J9691 Respiratory failure, unspecified with hypoxia: Secondary | ICD-10-CM | POA: Diagnosis not present

## 2014-07-02 DIAGNOSIS — J449 Chronic obstructive pulmonary disease, unspecified: Secondary | ICD-10-CM | POA: Diagnosis not present

## 2014-07-02 DIAGNOSIS — F319 Bipolar disorder, unspecified: Secondary | ICD-10-CM | POA: Diagnosis not present

## 2014-07-02 DIAGNOSIS — E119 Type 2 diabetes mellitus without complications: Secondary | ICD-10-CM | POA: Diagnosis not present

## 2014-07-02 DIAGNOSIS — I120 Hypertensive chronic kidney disease with stage 5 chronic kidney disease or end stage renal disease: Secondary | ICD-10-CM | POA: Diagnosis not present

## 2014-07-03 DIAGNOSIS — R52 Pain, unspecified: Secondary | ICD-10-CM | POA: Diagnosis not present

## 2014-07-03 DIAGNOSIS — N186 End stage renal disease: Secondary | ICD-10-CM | POA: Diagnosis not present

## 2014-07-03 DIAGNOSIS — D689 Coagulation defect, unspecified: Secondary | ICD-10-CM | POA: Diagnosis not present

## 2014-07-03 DIAGNOSIS — D509 Iron deficiency anemia, unspecified: Secondary | ICD-10-CM | POA: Diagnosis not present

## 2014-07-03 DIAGNOSIS — D631 Anemia in chronic kidney disease: Secondary | ICD-10-CM | POA: Diagnosis not present

## 2014-07-03 DIAGNOSIS — N2581 Secondary hyperparathyroidism of renal origin: Secondary | ICD-10-CM | POA: Diagnosis not present

## 2014-07-05 DIAGNOSIS — N2581 Secondary hyperparathyroidism of renal origin: Secondary | ICD-10-CM | POA: Diagnosis not present

## 2014-07-05 DIAGNOSIS — D631 Anemia in chronic kidney disease: Secondary | ICD-10-CM | POA: Diagnosis not present

## 2014-07-05 DIAGNOSIS — D509 Iron deficiency anemia, unspecified: Secondary | ICD-10-CM | POA: Diagnosis not present

## 2014-07-05 DIAGNOSIS — N186 End stage renal disease: Secondary | ICD-10-CM | POA: Diagnosis not present

## 2014-07-05 DIAGNOSIS — R52 Pain, unspecified: Secondary | ICD-10-CM | POA: Diagnosis not present

## 2014-07-05 DIAGNOSIS — D689 Coagulation defect, unspecified: Secondary | ICD-10-CM | POA: Diagnosis not present

## 2014-07-07 DIAGNOSIS — N186 End stage renal disease: Secondary | ICD-10-CM | POA: Diagnosis not present

## 2014-07-07 DIAGNOSIS — Z992 Dependence on renal dialysis: Secondary | ICD-10-CM | POA: Diagnosis not present

## 2014-07-08 DIAGNOSIS — R52 Pain, unspecified: Secondary | ICD-10-CM | POA: Diagnosis not present

## 2014-07-08 DIAGNOSIS — D689 Coagulation defect, unspecified: Secondary | ICD-10-CM | POA: Diagnosis not present

## 2014-07-08 DIAGNOSIS — D509 Iron deficiency anemia, unspecified: Secondary | ICD-10-CM | POA: Diagnosis not present

## 2014-07-08 DIAGNOSIS — N186 End stage renal disease: Secondary | ICD-10-CM | POA: Diagnosis not present

## 2014-07-08 DIAGNOSIS — N2581 Secondary hyperparathyroidism of renal origin: Secondary | ICD-10-CM | POA: Diagnosis not present

## 2014-07-08 DIAGNOSIS — Z23 Encounter for immunization: Secondary | ICD-10-CM | POA: Diagnosis not present

## 2014-07-08 DIAGNOSIS — D631 Anemia in chronic kidney disease: Secondary | ICD-10-CM | POA: Diagnosis not present

## 2014-07-10 DIAGNOSIS — R52 Pain, unspecified: Secondary | ICD-10-CM | POA: Diagnosis not present

## 2014-07-10 DIAGNOSIS — D631 Anemia in chronic kidney disease: Secondary | ICD-10-CM | POA: Diagnosis not present

## 2014-07-10 DIAGNOSIS — N2581 Secondary hyperparathyroidism of renal origin: Secondary | ICD-10-CM | POA: Diagnosis not present

## 2014-07-10 DIAGNOSIS — D509 Iron deficiency anemia, unspecified: Secondary | ICD-10-CM | POA: Diagnosis not present

## 2014-07-10 DIAGNOSIS — D689 Coagulation defect, unspecified: Secondary | ICD-10-CM | POA: Diagnosis not present

## 2014-07-10 DIAGNOSIS — Z23 Encounter for immunization: Secondary | ICD-10-CM | POA: Diagnosis not present

## 2014-07-10 DIAGNOSIS — N186 End stage renal disease: Secondary | ICD-10-CM | POA: Diagnosis not present

## 2014-07-12 DIAGNOSIS — R52 Pain, unspecified: Secondary | ICD-10-CM | POA: Diagnosis not present

## 2014-07-12 DIAGNOSIS — D689 Coagulation defect, unspecified: Secondary | ICD-10-CM | POA: Diagnosis not present

## 2014-07-12 DIAGNOSIS — D631 Anemia in chronic kidney disease: Secondary | ICD-10-CM | POA: Diagnosis not present

## 2014-07-12 DIAGNOSIS — D509 Iron deficiency anemia, unspecified: Secondary | ICD-10-CM | POA: Diagnosis not present

## 2014-07-12 DIAGNOSIS — Z23 Encounter for immunization: Secondary | ICD-10-CM | POA: Diagnosis not present

## 2014-07-12 DIAGNOSIS — N2581 Secondary hyperparathyroidism of renal origin: Secondary | ICD-10-CM | POA: Diagnosis not present

## 2014-07-12 DIAGNOSIS — N186 End stage renal disease: Secondary | ICD-10-CM | POA: Diagnosis not present

## 2014-07-15 DIAGNOSIS — D509 Iron deficiency anemia, unspecified: Secondary | ICD-10-CM | POA: Diagnosis not present

## 2014-07-15 DIAGNOSIS — R52 Pain, unspecified: Secondary | ICD-10-CM | POA: Diagnosis not present

## 2014-07-15 DIAGNOSIS — D689 Coagulation defect, unspecified: Secondary | ICD-10-CM | POA: Diagnosis not present

## 2014-07-15 DIAGNOSIS — N2581 Secondary hyperparathyroidism of renal origin: Secondary | ICD-10-CM | POA: Diagnosis not present

## 2014-07-15 DIAGNOSIS — Z23 Encounter for immunization: Secondary | ICD-10-CM | POA: Diagnosis not present

## 2014-07-15 DIAGNOSIS — N186 End stage renal disease: Secondary | ICD-10-CM | POA: Diagnosis not present

## 2014-07-15 DIAGNOSIS — D631 Anemia in chronic kidney disease: Secondary | ICD-10-CM | POA: Diagnosis not present

## 2014-07-17 DIAGNOSIS — R52 Pain, unspecified: Secondary | ICD-10-CM | POA: Diagnosis not present

## 2014-07-17 DIAGNOSIS — N2581 Secondary hyperparathyroidism of renal origin: Secondary | ICD-10-CM | POA: Diagnosis not present

## 2014-07-17 DIAGNOSIS — J449 Chronic obstructive pulmonary disease, unspecified: Secondary | ICD-10-CM | POA: Diagnosis not present

## 2014-07-17 DIAGNOSIS — Z23 Encounter for immunization: Secondary | ICD-10-CM | POA: Diagnosis not present

## 2014-07-17 DIAGNOSIS — N186 End stage renal disease: Secondary | ICD-10-CM | POA: Diagnosis not present

## 2014-07-17 DIAGNOSIS — D689 Coagulation defect, unspecified: Secondary | ICD-10-CM | POA: Diagnosis not present

## 2014-07-17 DIAGNOSIS — D631 Anemia in chronic kidney disease: Secondary | ICD-10-CM | POA: Diagnosis not present

## 2014-07-17 DIAGNOSIS — J209 Acute bronchitis, unspecified: Secondary | ICD-10-CM | POA: Diagnosis not present

## 2014-07-17 DIAGNOSIS — D509 Iron deficiency anemia, unspecified: Secondary | ICD-10-CM | POA: Diagnosis not present

## 2014-07-17 DIAGNOSIS — R7309 Other abnormal glucose: Secondary | ICD-10-CM | POA: Diagnosis not present

## 2014-07-17 DIAGNOSIS — I10 Essential (primary) hypertension: Secondary | ICD-10-CM | POA: Diagnosis not present

## 2014-07-18 DIAGNOSIS — R269 Unspecified abnormalities of gait and mobility: Secondary | ICD-10-CM | POA: Diagnosis not present

## 2014-07-18 DIAGNOSIS — J449 Chronic obstructive pulmonary disease, unspecified: Secondary | ICD-10-CM | POA: Diagnosis not present

## 2014-07-21 DIAGNOSIS — I120 Hypertensive chronic kidney disease with stage 5 chronic kidney disease or end stage renal disease: Secondary | ICD-10-CM | POA: Diagnosis not present

## 2014-07-21 DIAGNOSIS — E119 Type 2 diabetes mellitus without complications: Secondary | ICD-10-CM | POA: Diagnosis not present

## 2014-07-21 DIAGNOSIS — J449 Chronic obstructive pulmonary disease, unspecified: Secondary | ICD-10-CM | POA: Diagnosis not present

## 2014-07-21 DIAGNOSIS — F319 Bipolar disorder, unspecified: Secondary | ICD-10-CM | POA: Diagnosis not present

## 2014-07-21 DIAGNOSIS — Z992 Dependence on renal dialysis: Secondary | ICD-10-CM | POA: Diagnosis not present

## 2014-07-21 DIAGNOSIS — J9691 Respiratory failure, unspecified with hypoxia: Secondary | ICD-10-CM | POA: Diagnosis not present

## 2014-07-21 DIAGNOSIS — Z9181 History of falling: Secondary | ICD-10-CM | POA: Diagnosis not present

## 2014-07-21 DIAGNOSIS — N186 End stage renal disease: Secondary | ICD-10-CM | POA: Diagnosis not present

## 2014-07-21 DIAGNOSIS — K219 Gastro-esophageal reflux disease without esophagitis: Secondary | ICD-10-CM | POA: Diagnosis not present

## 2014-07-21 DIAGNOSIS — R1319 Other dysphagia: Secondary | ICD-10-CM | POA: Diagnosis not present

## 2014-07-21 DIAGNOSIS — J45909 Unspecified asthma, uncomplicated: Secondary | ICD-10-CM | POA: Diagnosis not present

## 2014-07-21 DIAGNOSIS — F329 Major depressive disorder, single episode, unspecified: Secondary | ICD-10-CM | POA: Diagnosis not present

## 2014-07-22 DIAGNOSIS — Z23 Encounter for immunization: Secondary | ICD-10-CM | POA: Diagnosis not present

## 2014-07-22 DIAGNOSIS — D509 Iron deficiency anemia, unspecified: Secondary | ICD-10-CM | POA: Diagnosis not present

## 2014-07-22 DIAGNOSIS — D689 Coagulation defect, unspecified: Secondary | ICD-10-CM | POA: Diagnosis not present

## 2014-07-22 DIAGNOSIS — D631 Anemia in chronic kidney disease: Secondary | ICD-10-CM | POA: Diagnosis not present

## 2014-07-22 DIAGNOSIS — N186 End stage renal disease: Secondary | ICD-10-CM | POA: Diagnosis not present

## 2014-07-22 DIAGNOSIS — N2581 Secondary hyperparathyroidism of renal origin: Secondary | ICD-10-CM | POA: Diagnosis not present

## 2014-07-22 DIAGNOSIS — R52 Pain, unspecified: Secondary | ICD-10-CM | POA: Diagnosis not present

## 2014-07-24 DIAGNOSIS — D689 Coagulation defect, unspecified: Secondary | ICD-10-CM | POA: Diagnosis not present

## 2014-07-24 DIAGNOSIS — D631 Anemia in chronic kidney disease: Secondary | ICD-10-CM | POA: Diagnosis not present

## 2014-07-24 DIAGNOSIS — Z23 Encounter for immunization: Secondary | ICD-10-CM | POA: Diagnosis not present

## 2014-07-24 DIAGNOSIS — N186 End stage renal disease: Secondary | ICD-10-CM | POA: Diagnosis not present

## 2014-07-24 DIAGNOSIS — R52 Pain, unspecified: Secondary | ICD-10-CM | POA: Diagnosis not present

## 2014-07-24 DIAGNOSIS — D509 Iron deficiency anemia, unspecified: Secondary | ICD-10-CM | POA: Diagnosis not present

## 2014-07-24 DIAGNOSIS — N2581 Secondary hyperparathyroidism of renal origin: Secondary | ICD-10-CM | POA: Diagnosis not present

## 2014-07-26 DIAGNOSIS — D631 Anemia in chronic kidney disease: Secondary | ICD-10-CM | POA: Diagnosis not present

## 2014-07-26 DIAGNOSIS — D509 Iron deficiency anemia, unspecified: Secondary | ICD-10-CM | POA: Diagnosis not present

## 2014-07-26 DIAGNOSIS — R52 Pain, unspecified: Secondary | ICD-10-CM | POA: Diagnosis not present

## 2014-07-26 DIAGNOSIS — D689 Coagulation defect, unspecified: Secondary | ICD-10-CM | POA: Diagnosis not present

## 2014-07-26 DIAGNOSIS — N186 End stage renal disease: Secondary | ICD-10-CM | POA: Diagnosis not present

## 2014-07-26 DIAGNOSIS — N2581 Secondary hyperparathyroidism of renal origin: Secondary | ICD-10-CM | POA: Diagnosis not present

## 2014-07-26 DIAGNOSIS — Z23 Encounter for immunization: Secondary | ICD-10-CM | POA: Diagnosis not present

## 2014-07-29 ENCOUNTER — Emergency Department (HOSPITAL_COMMUNITY): Payer: Medicare Other

## 2014-07-29 ENCOUNTER — Encounter (HOSPITAL_COMMUNITY): Payer: Self-pay | Admitting: Neurology

## 2014-07-29 ENCOUNTER — Inpatient Hospital Stay (HOSPITAL_COMMUNITY)
Admission: EM | Admit: 2014-07-29 | Discharge: 2014-08-01 | DRG: 682 | Disposition: A | Discharge status: Home Health | Payer: Medicare Other | Attending: Internal Medicine | Admitting: Internal Medicine

## 2014-07-29 DIAGNOSIS — E119 Type 2 diabetes mellitus without complications: Secondary | ICD-10-CM | POA: Diagnosis not present

## 2014-07-29 DIAGNOSIS — Z833 Family history of diabetes mellitus: Secondary | ICD-10-CM

## 2014-07-29 DIAGNOSIS — R0602 Shortness of breath: Secondary | ICD-10-CM | POA: Diagnosis not present

## 2014-07-29 DIAGNOSIS — I517 Cardiomegaly: Secondary | ICD-10-CM | POA: Diagnosis not present

## 2014-07-29 DIAGNOSIS — N189 Chronic kidney disease, unspecified: Secondary | ICD-10-CM | POA: Diagnosis not present

## 2014-07-29 DIAGNOSIS — N2581 Secondary hyperparathyroidism of renal origin: Secondary | ICD-10-CM | POA: Diagnosis not present

## 2014-07-29 DIAGNOSIS — Z79899 Other long term (current) drug therapy: Secondary | ICD-10-CM | POA: Diagnosis not present

## 2014-07-29 DIAGNOSIS — Z9115 Patient's noncompliance with renal dialysis: Secondary | ICD-10-CM | POA: Diagnosis present

## 2014-07-29 DIAGNOSIS — J45909 Unspecified asthma, uncomplicated: Secondary | ICD-10-CM | POA: Diagnosis present

## 2014-07-29 DIAGNOSIS — K219 Gastro-esophageal reflux disease without esophagitis: Secondary | ICD-10-CM | POA: Diagnosis present

## 2014-07-29 DIAGNOSIS — F319 Bipolar disorder, unspecified: Secondary | ICD-10-CM | POA: Diagnosis present

## 2014-07-29 DIAGNOSIS — J9600 Acute respiratory failure, unspecified whether with hypoxia or hypercapnia: Secondary | ICD-10-CM | POA: Diagnosis not present

## 2014-07-29 DIAGNOSIS — N186 End stage renal disease: Secondary | ICD-10-CM | POA: Diagnosis not present

## 2014-07-29 DIAGNOSIS — D649 Anemia, unspecified: Secondary | ICD-10-CM | POA: Diagnosis not present

## 2014-07-29 DIAGNOSIS — F4310 Post-traumatic stress disorder, unspecified: Secondary | ICD-10-CM | POA: Diagnosis not present

## 2014-07-29 DIAGNOSIS — F1099 Alcohol use, unspecified with unspecified alcohol-induced disorder: Secondary | ICD-10-CM | POA: Diagnosis not present

## 2014-07-29 DIAGNOSIS — J441 Chronic obstructive pulmonary disease with (acute) exacerbation: Secondary | ICD-10-CM | POA: Diagnosis present

## 2014-07-29 DIAGNOSIS — D631 Anemia in chronic kidney disease: Secondary | ICD-10-CM | POA: Diagnosis not present

## 2014-07-29 DIAGNOSIS — Z885 Allergy status to narcotic agent status: Secondary | ICD-10-CM | POA: Diagnosis not present

## 2014-07-29 DIAGNOSIS — Z96651 Presence of right artificial knee joint: Secondary | ICD-10-CM | POA: Diagnosis not present

## 2014-07-29 DIAGNOSIS — E875 Hyperkalemia: Secondary | ICD-10-CM | POA: Diagnosis present

## 2014-07-29 DIAGNOSIS — G4733 Obstructive sleep apnea (adult) (pediatric): Secondary | ICD-10-CM | POA: Diagnosis not present

## 2014-07-29 DIAGNOSIS — J9601 Acute respiratory failure with hypoxia: Secondary | ICD-10-CM | POA: Diagnosis not present

## 2014-07-29 DIAGNOSIS — Z888 Allergy status to other drugs, medicaments and biological substances status: Secondary | ICD-10-CM

## 2014-07-29 DIAGNOSIS — Z992 Dependence on renal dialysis: Secondary | ICD-10-CM | POA: Diagnosis not present

## 2014-07-29 DIAGNOSIS — Z8249 Family history of ischemic heart disease and other diseases of the circulatory system: Secondary | ICD-10-CM | POA: Diagnosis not present

## 2014-07-29 DIAGNOSIS — F17210 Nicotine dependence, cigarettes, uncomplicated: Secondary | ICD-10-CM | POA: Diagnosis present

## 2014-07-29 DIAGNOSIS — E8770 Fluid overload, unspecified: Secondary | ICD-10-CM | POA: Diagnosis not present

## 2014-07-29 DIAGNOSIS — I120 Hypertensive chronic kidney disease with stage 5 chronic kidney disease or end stage renal disease: Principal | ICD-10-CM | POA: Diagnosis present

## 2014-07-29 DIAGNOSIS — G40909 Epilepsy, unspecified, not intractable, without status epilepticus: Secondary | ICD-10-CM | POA: Diagnosis present

## 2014-07-29 DIAGNOSIS — R0781 Pleurodynia: Secondary | ICD-10-CM

## 2014-07-29 DIAGNOSIS — R079 Chest pain, unspecified: Secondary | ICD-10-CM | POA: Diagnosis not present

## 2014-07-29 DIAGNOSIS — F17200 Nicotine dependence, unspecified, uncomplicated: Secondary | ICD-10-CM | POA: Diagnosis present

## 2014-07-29 DIAGNOSIS — Z9981 Dependence on supplemental oxygen: Secondary | ICD-10-CM

## 2014-07-29 DIAGNOSIS — R0600 Dyspnea, unspecified: Secondary | ICD-10-CM

## 2014-07-29 LAB — IRON AND TIBC
Iron: 67 ug/dL (ref 42–145)
Saturation Ratios: 26 % (ref 20–55)
TIBC: 257 ug/dL (ref 250–470)
UIBC: 190 ug/dL (ref 125–400)

## 2014-07-29 LAB — BASIC METABOLIC PANEL
Anion gap: 13 (ref 5–15)
BUN: 38 mg/dL — ABNORMAL HIGH (ref 6–23)
CO2: 20 mmol/L (ref 19–32)
Calcium: 10.1 mg/dL (ref 8.4–10.5)
Chloride: 111 mmol/L (ref 96–112)
Creatinine, Ser: 4.87 mg/dL — ABNORMAL HIGH (ref 0.50–1.10)
GFR calc Af Amer: 11 mL/min — ABNORMAL LOW (ref >90)
GFR calc non Af Amer: 9 mL/min — ABNORMAL LOW (ref >90)
Glucose, Bld: 153 mg/dL — ABNORMAL HIGH (ref 70–99)
Potassium: 5.6 mmol/L — ABNORMAL HIGH (ref 3.5–5.1)
Sodium: 144 mmol/L (ref 135–145)

## 2014-07-29 LAB — RETICULOCYTES
RBC.: 2.14 MIL/uL — ABNORMAL LOW (ref 3.87–5.11)
RBC.: 1.96 MIL/uL — ABNORMAL LOW (ref 3.87–5.11)
Retic Count, Absolute: 139.1 10*3/uL (ref 19.0–186.0)
Retic Count, Absolute: 101.9 10*3/uL (ref 19.0–186.0)
Retic Ct Pct: 6.5 % — ABNORMAL HIGH (ref 0.4–3.1)
Retic Ct Pct: 5.2 % — ABNORMAL HIGH (ref 0.4–3.1)

## 2014-07-29 LAB — CBC
HCT: 23 % — ABNORMAL LOW (ref 36.0–46.0)
Hemoglobin: 6.7 g/dL — CL (ref 12.0–15.0)
MCH: 31.3 pg (ref 26.0–34.0)
MCHC: 29.1 g/dL — ABNORMAL LOW (ref 30.0–36.0)
MCV: 107.5 fL — ABNORMAL HIGH (ref 78.0–100.0)
Platelets: 477 10*3/uL — ABNORMAL HIGH (ref 150–400)
RBC: 2.14 MIL/uL — ABNORMAL LOW (ref 3.87–5.11)
RDW: 19.1 % — ABNORMAL HIGH (ref 11.5–15.5)
WBC: 7.1 10*3/uL (ref 4.0–10.5)

## 2014-07-29 LAB — PHOSPHORUS: Phosphorus: 5.7 mg/dL — ABNORMAL HIGH (ref 2.3–4.6)

## 2014-07-29 LAB — MAGNESIUM: Magnesium: 2.8 mg/dL — ABNORMAL HIGH (ref 1.5–2.5)

## 2014-07-29 LAB — PREPARE RBC (CROSSMATCH)

## 2014-07-29 LAB — ABO/RH: ABO/RH(D): A POS

## 2014-07-29 LAB — I-STAT TROPONIN, ED: Troponin i, poc: 0.01 ng/mL (ref 0.00–0.08)

## 2014-07-29 MED ORDER — ONDANSETRON HCL 4 MG PO TABS: 4.0000 mg | ORAL_TABLET | Freq: Four times a day (QID) | ORAL | Status: DC | PRN

## 2014-07-29 MED ORDER — SODIUM CHLORIDE 0.9 % IV SOLN: Freq: Once | INTRAVENOUS | Status: DC

## 2014-07-29 MED ORDER — ZIPRASIDONE HCL 60 MG PO CAPS
60.0000 mg | ORAL_CAPSULE | Freq: Two times a day (BID) | ORAL | Status: DC
  Administered 2014-07-29: 60 mg via ORAL
  Filled 2014-07-29 (×3): qty 1

## 2014-07-29 MED ORDER — SODIUM CHLORIDE 0.9 % IV SOLN: 250.0000 mL | INTRAVENOUS | Status: DC | PRN

## 2014-07-29 MED ORDER — SODIUM CHLORIDE 0.9 % IV SOLN
250.0000 mg | INTRAVENOUS | Status: DC
  Administered 2014-07-29 – 2014-07-31 (×2): 250 mg via INTRAVENOUS
  Filled 2014-07-29 (×4): qty 20

## 2014-07-29 MED ORDER — SIMVASTATIN 10 MG PO TABS
10.0000 mg | ORAL_TABLET | Freq: Every day | ORAL | Status: DC
  Administered 2014-07-29 – 2014-07-31 (×3): 10 mg via ORAL
  Filled 2014-07-29 (×4): qty 1

## 2014-07-29 MED ORDER — SODIUM CHLORIDE 0.9 % IJ SOLN: 3.0000 mL | INTRAMUSCULAR | Status: DC | PRN

## 2014-07-29 MED ORDER — DIVALPROEX SODIUM 500 MG PO DR TAB
750.0000 mg | DELAYED_RELEASE_TABLET | Freq: Every day | ORAL | Status: DC
  Administered 2014-07-29: 750 mg via ORAL
  Filled 2014-07-29 (×2): qty 1

## 2014-07-29 MED ORDER — ACETAMINOPHEN 325 MG PO TABS
650.0000 mg | ORAL_TABLET | Freq: Four times a day (QID) | ORAL | Status: DC | PRN
  Administered 2014-07-29 – 2014-07-31 (×3): 650 mg via ORAL
  Filled 2014-07-29 (×5): qty 2

## 2014-07-29 MED ORDER — SODIUM CHLORIDE 0.9 % IJ SOLN
3.0000 mL | Freq: Two times a day (BID) | INTRAMUSCULAR | Status: DC
  Administered 2014-07-30 – 2014-08-01 (×5): 3 mL via INTRAVENOUS

## 2014-07-29 MED ORDER — IPRATROPIUM-ALBUTEROL 0.5-2.5 (3) MG/3ML IN SOLN
3.0000 mL | RESPIRATORY_TRACT | Status: DC
  Administered 2014-07-29 – 2014-07-30 (×6): 3 mL via RESPIRATORY_TRACT
  Filled 2014-07-29 (×6): qty 3

## 2014-07-29 MED ORDER — ACETAMINOPHEN 650 MG RE SUPP: 650.0000 mg | Freq: Four times a day (QID) | RECTAL | Status: DC | PRN

## 2014-07-29 MED ORDER — HEPARIN SODIUM (PORCINE) 5000 UNIT/ML IJ SOLN
5000.0000 [IU] | Freq: Three times a day (TID) | INTRAMUSCULAR | Status: DC
  Administered 2014-07-29 – 2014-07-31 (×7): 5000 [IU] via SUBCUTANEOUS
  Filled 2014-07-29 (×9): qty 1

## 2014-07-29 MED ORDER — DARBEPOETIN ALFA 60 MCG/0.3ML IJ SOSY
60.0000 ug | PREFILLED_SYRINGE | INTRAMUSCULAR | Status: DC
  Administered 2014-07-31 (×2): 60 ug via INTRAVENOUS
  Filled 2014-07-29: qty 0.3

## 2014-07-29 MED ORDER — MOMETASONE FURO-FORMOTEROL FUM 100-5 MCG/ACT IN AERO
2.0000 | INHALATION_SPRAY | Freq: Two times a day (BID) | RESPIRATORY_TRACT | Status: DC
  Administered 2014-07-29 – 2014-08-01 (×6): 2 via RESPIRATORY_TRACT
  Filled 2014-07-29: qty 8.8

## 2014-07-29 MED ORDER — NEPRO/CARBSTEADY PO LIQD: 237.0000 mL | ORAL | Status: DC | PRN

## 2014-07-29 MED ORDER — ONDANSETRON HCL 4 MG/2ML IJ SOLN: 4.0000 mg | Freq: Four times a day (QID) | INTRAMUSCULAR | Status: DC | PRN

## 2014-07-29 MED ORDER — RENA-VITE PO TABS
1.0000 | ORAL_TABLET | Freq: Every day | ORAL | Status: DC
  Administered 2014-07-29 – 2014-07-31 (×3): 1 via ORAL
  Filled 2014-07-29 (×4): qty 1

## 2014-07-29 MED ORDER — NICOTINE 21 MG/24HR TD PT24
21.0000 mg | MEDICATED_PATCH | Freq: Every day | TRANSDERMAL | Status: DC
  Administered 2014-07-29 – 2014-08-01 (×4): 21 mg via TRANSDERMAL
  Filled 2014-07-29 (×4): qty 1

## 2014-07-29 NOTE — ED Notes (Signed)
Report called to dialysis,

## 2014-07-29 NOTE — ED Provider Notes (Signed)
CSN: [id number]     Arrival date & time 07/29/14  1129 History   First MD Initiated Contact with Patient 07/29/14 1155     Chief Complaint  Patient presents with  . Abnormal Lab  . Chest Pain      HPI Patient was sent to the emergency Department for reported low hemoglobin drawn on outpatient labs.  Patient has end-stage renal disease.  She does dialysis on Tuesday Thursdays and Saturdays.  She has missed her last 2 sessions including today.  She had blood work drawn 4 days ago and she was called today and told that her hemoglobin was 5 and to come to the emergency department for admission for blood transfusion.  She reports mild shortness of breath at this time.  She denies orthopnea.  She states that she's felt weak and her family reports that she's been sleeping more over the past several days.  She believes the dialysis takes too long and she is frustrated by the dialysis process.   Past Medical History  Diagnosis Date  . Mental disorder   . Depression   . Hypertension   . Overdose   . Tobacco use disorder 11/13/2012  . Complication of anesthesia     difficulty going to sleep  . Chronic kidney disease     06/11/13- not on dialysis  . Shortness of breath     lying down flat  . PTSD (post-traumatic stress disorder)   . Asthma   . COPD (chronic obstructive pulmonary disease)   . Heart murmur   . GERD (gastroesophageal reflux disease)   . Seizures     "passsed out"  . History of blood transfusion   . Diabetes mellitus without complication     denies   Past Surgical History  Procedure Laterality Date  . Right knee replacement      she says it was last year.  . Esophagogastroduodenoscopy Left 11/14/2012    Procedure: ESOPHAGOGASTRODUODENOSCOPY (EGD);  Surgeon: [name], MD;  Location: [facility];  Service: Endoscopy;  Laterality: Left;  . Joint replacement Right     knee  . Parathyroidectomy    . Av fistula placement Right 06/12/2013    Procedure: ARTERIOVENOUS (AV) FISTULA  CREATION; RIGHT  BASILIC VEIN TRANSPOSITION with Intraoperative ultrasound;  Surgeon: Mal [name], MD;  Location: [facility];  Service: Vascular;  Laterality: Right;   Family History  Problem Relation Age of Onset  . Diabetes Mother   . Hyperlipidemia Mother   . Hypertension Mother   . Diabetes Father   . Hypertension Father   . Hyperlipidemia Father    History  Substance Use Topics  . Smoking status: Current Every Day Smoker -- 2.00 packs/day for 40 years    Types: Cigarettes, Cigars  . Smokeless tobacco: Never Used  . Alcohol Use: No     Comment: none in over a month per daughter   OB History    No data available     Review of Systems  All other systems reviewed and are negative.     Allergies  Codeine sulfate; Haldol; Risperidone and related; Gabapentin; Invega; Trazodone and nefazodone; and Vistaril  Home Medications   Prior to Admission medications   Medication Sig Start Date End Date Taking? Authorizing Provider  acetaminophen (TYLENOL) 325 MG tablet Take 2 tablets (650 mg total) by mouth every 4 (four) hours as needed for fever, headache or mild pain. 05/20/14   Bynum Bellows, MD  albuterol (PROVENTIL) (2.5 MG/3ML) 0.083% nebulizer solution  Take 3 mLs (2.5 mg total) by nebulization every 4 (four) hours as needed for wheezing or shortness of breath. 04/03/14   Encarnacion Slates, NP  bisacodyl (DULCOLAX) 5 MG EC tablet Take 1 tablet (5 mg total) by mouth daily as needed for moderate constipation. 05/20/14   Bynum Bellows, MD  Darbepoetin Alfa (ARANESP) 60 MCG/0.3ML SOSY injection Inject 0.3 mLs (60 mcg total) into the vein every Thursday with hemodialysis. 05/20/14   Bynum Bellows, MD  divalproex (DEPAKOTE) 500 MG DR tablet Take 1 tablet (500 mg total) by mouth every 12 (twelve) hours. 05/20/14   Srikar Janna Arch, MD  docusate sodium 100 MG CAPS Take 100 mg by mouth 2 (two) times daily. 05/20/14   Srikar Janna Arch, MD  ferric gluconate 250 mg in sodium chloride 0.9 % 100 mL Inject  250 mg into the vein every Tuesday, Thursday, Saturday, and Sunday. 05/20/14   Bynum Bellows, MD  Fluticasone-Salmeterol (ADVAIR) 100-50 MCG/DOSE AEPB Inhale 1 puff into the lungs 2 (two) times daily. For asthma 04/03/14   Encarnacion Slates, NP  Menthol, Topical Analgesic, (ICY HOT EX) Apply 1 application topically daily as needed (back pain).    Historical Provider, MD  metoprolol tartrate (LOPRESSOR) 25 MG tablet Take 0.5 tablets (12.5 mg total) by mouth 2 (two) times daily. For HTN 05/20/14   Bynum Bellows, MD  multivitamin (RENA-VIT) TABS tablet Take 1 tablet by mouth at bedtime. 05/20/14   Srikar Janna Arch, MD  nicotine (NICODERM CQ - DOSED IN MG/24 HOURS) 21 mg/24hr patch Place 1 patch (21 mg total) onto the skin daily. 05/20/14   Srikar Janna Arch, MD  Nutritional Supplements (FEEDING SUPPLEMENT, NEPRO CARB STEADY,) LIQD Take 237 mLs by mouth as needed (missed meal during dialysis.). 05/20/14   Bynum Bellows, MD  simvastatin (ZOCOR) 10 MG tablet Take 1 tablet (10 mg total) by mouth at bedtime. For high cholesterol/fats Patient not taking: Reported on 05/13/2014 04/03/14   Encarnacion Slates, NP  ziprasidone (GEODON) 60 MG capsule Take 1 capsule (60 mg total) by mouth 2 (two) times daily with a meal. For mood control Patient taking differently: Take 60 mg by mouth at bedtime. For mood control 04/03/14   Encarnacion Slates, NP   BP 119/73 mmHg  Pulse 91  Temp(Src) 98.4 F (36.9 C) (Oral)  Resp 23  SpO2 98%  LMP 05/09/2008 Physical Exam  Constitutional: She is oriented to person, place, and time. She appears well-developed and well-nourished. No distress.  HENT:  Head: Normocephalic and atraumatic.  Eyes: EOM are normal.  Neck: Normal range of motion.  Cardiovascular: Normal rate, regular rhythm and normal heart sounds.   Pulmonary/Chest: Effort normal and breath sounds normal.  Mild rales in bases  Abdominal: Soft. She exhibits no distension. There is no tenderness.  Musculoskeletal: Normal range of  motion.  Neurological: She is alert and oriented to person, place, and time.  Skin: Skin is warm and dry.  Psychiatric: She has a normal mood and affect. Judgment normal.  Nursing note and vitals reviewed.   ED Course  Procedures (including critical care time)  CRITICAL CARE Performed by: Hoy Morn Total critical care time: 32 Critical care time was exclusive of separately billable procedures and treating other patients. Critical care was necessary to treat or prevent imminent or life-threatening deterioration. Critical care was time spent personally by me on the following activities: development of treatment plan with patient and/or surrogate as well as nursing, discussions  with consultants, evaluation of patient's response to treatment, examination of patient, obtaining history from patient or surrogate, ordering and performing treatments and interventions, ordering and review of laboratory studies, ordering and review of radiographic studies, pulse oximetry and re-evaluation of patient's condition.  Labs Review Labs Reviewed  CBC - Abnormal; Notable for the following:    RBC 2.14 (*)    Hemoglobin 6.7 (*)    HCT 23.0 (*)    MCV 107.5 (*)    MCHC 29.1 (*)    RDW 19.1 (*)    Platelets 477 (*)    All other components within normal limits  BASIC METABOLIC PANEL - Abnormal; Notable for the following:    Potassium 5.6 (*)    Glucose, Bld 153 (*)    BUN 38 (*)    Creatinine, Ser 4.87 (*)    GFR calc non Af Amer 9 (*)    GFR calc Af Amer 11 (*)    All other components within normal limits  RETICULOCYTES - Abnormal; Notable for the following:    Retic Ct Pct 6.5 (*)    RBC. 2.14 (*)    All other components within normal limits  MAGNESIUM - Abnormal; Notable for the following:    Magnesium 2.8 (*)    All other components within normal limits  PHOSPHORUS - Abnormal; Notable for the following:    Phosphorus 5.7 (*)    All other components within normal limits  VITAMIN B12   FOLATE  IRON AND TIBC  FERRITIN  I-STAT TROPOININ, ED  TYPE AND SCREEN  ABO/RH  PREPARE RBC (CROSSMATCH)    Imaging Review Dg Chest 2 View  07/29/2014   CLINICAL DATA:  54 year old female with generalized chest pain. Has missed dialysis recently. Initial encounter.  EXAM: CHEST  2 VIEW  COMPARISON:  05/16/2014 and earlier.  FINDINGS: Similar low lung volumes. Stable cardiomegaly and mediastinal contours. No pneumothorax. No pleural effusion or pulmonary edema. No consolidation. Mild linear scarring or atelectasis in the left mid lung. Stable right axillary surgical clips. No acute osseous abnormality identified.  IMPRESSION: Stable cardiomegaly. No acute cardiopulmonary abnormality.   Electronically Signed   By: Genevie Ann M.D.   On: 07/29/2014 13:09  I personally reviewed the imaging tests through PACS system I reviewed available ER/hospitalization records through the EMR    EKG Interpretation   Date/Time:  Tuesday July 29 2014 11:53:38 EDT Ventricular Rate:  97 PR Interval:  190 QRS Duration: 84 QT Interval:  360 QTC Calculation: 457 R Axis:   52 Text Interpretation:  Normal sinus rhythm Septal infarct , age  undetermined Abnormal ECG No significant change was found Confirmed by  Ares Tegtmeyer  MD, Lennette Bihari ([id number]) on 07/29/2014 12:07:49 PM      MDM   Final diagnoses:  Anemia, unspecified anemia type  ESRD (end stage renal disease)  Dialysis patient, noncompliant   Symptomatic anemia.  Transfuse blood now.  Hemoglobin less than 7.  Admit to the hospital.  She'll need dialysis today as well.  She will likely need dialysis after her first unit of blood transfusion.  Admit to the hospital.    Jola Schmidt, MD 07/31/14 [phone]

## 2014-07-29 NOTE — H&P (Signed)
[facility] History and Physical                                                                                    Jennifer Lawson, is a 54 y.o. female  MRN: [id number]   DOB - June 27, 1960  Admit Date - 07/29/2014  Outpatient Primary MD for the patient is [facility]  With History of -  Past Medical History  Diagnosis Date  . Mental disorder   . Depression   . Hypertension   . Overdose   . Tobacco use disorder 11/13/2012  . Complication of anesthesia     difficulty going to sleep  . Chronic kidney disease     06/11/13- not on dialysis  . Shortness of breath     lying down flat  . PTSD (post-traumatic stress disorder)   . Asthma   . COPD (chronic obstructive pulmonary disease)   . Heart murmur   . GERD (gastroesophageal reflux disease)   . Seizures     "passsed out"  . History of blood transfusion   . Diabetes mellitus without complication     denies      Past Surgical History  Procedure Laterality Date  . Right knee replacement      she says it was last year.  . Esophagogastroduodenoscopy Left 11/14/2012    Procedure: ESOPHAGOGASTRODUODENOSCOPY (EGD);  Surgeon: [name], MD;  Location: [facility];  Service: Endoscopy;  Laterality: Left;  . Joint replacement Right     knee  . Parathyroidectomy    . Av fistula placement Right 06/12/2013    Procedure: ARTERIOVENOUS (AV) FISTULA CREATION; RIGHT  BASILIC VEIN TRANSPOSITION with Intraoperative ultrasound;  Surgeon: Mal [name], MD;  Location: [facility];  Service: Vascular;  Laterality: Right;    in for   Chief Complaint  Patient presents with  . Abnormal Lab  . Chest Pain     HPI 54 year old female patient with known history of bipolar disorder with psychotic features, chronic kidney disease on dialysis with nonadherence to dialysis schedule, hypertension, previous salicylate overdose, COPD with ongoing tobacco abuse, and seizure disorder. She was sent to the emergency department after outpatient labs  revealed low hemoglobin. She endorsed to the emergency room physician that she had missed her last 2 dialysis sessions. She reported mild shortness of breath to the emergency room physician but denied orthopnea but did report weakness. Her family reported to the EDP she been sleeping more over the past several days. Patient believes that dialysis take too long and she is frustrated by the dialysis process therefore does not go.  In the ER patient was afebrile mildly tachycardic and noted with soft blood pressures for a person with underlying hypertension. She is not hypoxic. BUN was 38 creatinine 4.7 and potassium was 5.6. Hemoglobin was 6.7 and had been reported at around 5 on the outpatient labs. ER physician has ordered 1 unit of blood. Chest x-ray revealed stable cardiomegaly without any acute cardiopulmonary abnormality.  Review of Systems   In addition to the HPI above,  No Fever-chills, myalgias or other constitutional symptoms No Headache, changes with Vision or hearing, new weakness, tingling, numbness  in any extremity, No problems swallowing food or Liquids, indigestion/reflux No Chest pain, Cough or Shortness of Breath, palpitations, orthopnea or DOE No Abdominal pain, N/V; no melena or hematochezia, no dark tarry stools, Bowel movements are regular, No dysuria, hematuria or flank pain No new skin rashes, lesions, masses or bruises, No new joints pains-aches No recent weight gain or loss No polyuria, polydypsia or polyphagia,  *A full 10 point Review of Systems was done, except as stated above, all other Review of Systems were negative.  Social History History  Substance Use Topics  . Smoking status: Current Every Day Smoker -- 2.00 packs/day for 40 years    Types: Cigarettes, Cigars  . Smokeless tobacco: Never Used  . Alcohol Use: No     Comment: none in over a month per daughter    Family History Family History  Problem Relation Age of Onset  . Diabetes Mother   .  Hyperlipidemia Mother   . Hypertension Mother   . Diabetes Father   . Hypertension Father   . Hyperlipidemia Father     Prior to Admission medications   Medication Sig Start Date End Date Taking? Authorizing Provider  acetaminophen (TYLENOL) 325 MG tablet Take 2 tablets (650 mg total) by mouth every 4 (four) hours as needed for fever, headache or mild pain. 05/20/14  Yes Bynum Bellows, MD  albuterol (PROVENTIL) (2.5 MG/3ML) 0.083% nebulizer solution Take 3 mLs (2.5 mg total) by nebulization every 4 (four) hours as needed for wheezing or shortness of breath. 04/03/14  Yes Encarnacion Slates, NP  bisacodyl (DULCOLAX) 5 MG EC tablet Take 1 tablet (5 mg total) by mouth daily as needed for moderate constipation. 05/20/14  Yes Bynum Bellows, MD  Darbepoetin Alfa (ARANESP) 60 MCG/0.3ML SOSY injection Inject 0.3 mLs (60 mcg total) into the vein every Thursday with hemodialysis. 05/20/14  Yes Srikar Janna Arch, MD  divalproex (DEPAKOTE) 250 MG DR tablet Take 750 mg by mouth daily.   Yes Historical Provider, MD  ferric gluconate 250 mg in sodium chloride 0.9 % 100 mL Inject 250 mg into the vein every Tuesday, Thursday, Saturday, and Sunday. 05/20/14  Yes Srikar Janna Arch, MD  Fluticasone-Salmeterol (ADVAIR) 100-50 MCG/DOSE AEPB Inhale 1 puff into the lungs 2 (two) times daily. For asthma 04/03/14  Yes Encarnacion Slates, NP  metoprolol tartrate (LOPRESSOR) 25 MG tablet Take 0.5 tablets (12.5 mg total) by mouth 2 (two) times daily. For HTN 05/20/14  Yes Srikar Janna Arch, MD  multivitamin (RENA-VIT) TABS tablet Take 1 tablet by mouth at bedtime. 05/20/14  Yes Srikar Janna Arch, MD  nicotine (NICODERM CQ - DOSED IN MG/24 HOURS) 21 mg/24hr patch Place 1 patch (21 mg total) onto the skin daily. 05/20/14  Yes Srikar Janna Arch, MD  Nutritional Supplements (FEEDING SUPPLEMENT, NEPRO CARB STEADY,) LIQD Take 237 mLs by mouth as needed (missed meal during dialysis.). 05/20/14  Yes Srikar Janna Arch, MD  PROAIR HFA 108 (90 BASE) MCG/ACT inhaler  Inhale 1 puff into the lungs every 4 (four) hours as needed. wheezing 07/19/14  Yes Historical Provider, MD  simvastatin (ZOCOR) 10 MG tablet Take 1 tablet (10 mg total) by mouth at bedtime. For high cholesterol/fats 04/03/14  Yes Encarnacion Slates, NP  ziprasidone (GEODON) 60 MG capsule Take 1 capsule (60 mg total) by mouth 2 (two) times daily with a meal. For mood control Patient taking differently: Take 60 mg by mouth 2 (two) times daily. For mood control 04/03/14  Yes Herbert Pun  I [place]woko, NP  divalproex (DEPAKOTE) 500 MG DR tablet Take 1 tablet (500 mg total) by mouth every 12 (twelve) hours. 05/20/14   Srikar Janna Arch, MD  docusate sodium 100 MG CAPS Take 100 mg by mouth 2 (two) times daily. 05/20/14   Bynum Bellows, MD    Allergies  Allergen Reactions  . Codeine Sulfate Anaphylaxis    Daughter called about having this allergy   . Haldol [Haloperidol Lactate] Shortness Of Breath  . Risperidone And Related Shortness Of Breath  . Gabapentin Other (See Comments)    seizures  . Invega [Paliperidone Er] Nausea And Vomiting  . Trazodone And Nefazodone Other (See Comments)    Makes pt loose balance and fall  . Vistaril [Hydroxyzine Hcl] Nausea And Vomiting    Physical Exam  Vitals  Blood pressure 121/75, pulse 88, temperature 98.4 F (36.9 C), temperature source Oral, resp. rate 22, last menstrual period 05/09/2008, SpO2 99 %.   General:  In no acute distress, appears chronically EL  Psych:  Flat affect, Awake and Alert, Oriented X 3.   Neuro:   No focal neurological deficits, CN II through XII intact, Strength 5/5 all 4 extremities, Sensation intact all 4 extremities.  ENT:  Ears and Eyes appear Normal, Conjunctivae clear, PER. Moist oral mucosa without erythema or exudates.  Neck:  Supple, No lymphadenopathy appreciated  Respiratory:  Symmetrical chest wall movement, Good air movement bilaterally, scattered expiratory wheezing with some bibasal crackles. Room Air  Cardiac:  RRR, No  Murmurs, no LE edema noted, no JVD, No carotid bruits, peripheral pulses palpable at 2+  Abdomen:  Positive bowel sounds, Soft, mildly tender epigastrium, Non distended,  No masses appreciated, no obvious hepatosplenomegaly  Skin:  No Cyanosis, Normal Skin Turgor, No Skin Rash or Bruise.  Extremities: Symmetrical without obvious trauma or injury,  no effusions.  Data Review  CBC  Recent Labs Lab 07/29/14 1305  WBC 7.1  HGB 6.7*  HCT 23.0*  PLT 477*  MCV 107.5*  MCH 31.3  MCHC 29.1*  RDW 19.1*    Chemistries   Recent Labs Lab 07/29/14 1200 07/29/14 1305  NA  --  144  K  --  5.6*  CL  --  111  CO2  --  20  GLUCOSE  --  153*  BUN  --  38*  CREATININE  --  4.87*  CALCIUM  --  10.1  MG 2.8*  --     CrCl cannot be calculated (Unknown ideal weight.).  No results for input(s): TSH, T4TOTAL, T3FREE, THYROIDAB in the last 72 hours.  Invalid input(s): FREET3  Coagulation profile No results for input(s): INR, PROTIME in the last 168 hours.  No results for input(s): DDIMER in the last 72 hours.  Cardiac Enzymes No results for input(s): CKMB, TROPONINI, MYOGLOBIN in the last 168 hours.  Invalid input(s): CK  Invalid input(s): POCBNP  Urinalysis    Component Value Date/Time   COLORURINE YELLOW 05/14/2014 0808   APPEARANCEUR CLEAR 05/14/2014 0808   LABSPEC 1.013 05/14/2014 0808   PHURINE 5.5 05/14/2014 0808   GLUCOSEU NEGATIVE 05/14/2014 0808   HGBUR NEGATIVE 05/14/2014 0808   BILIRUBINUR NEGATIVE 05/14/2014 0808   KETONESUR NEGATIVE 05/14/2014 0808   PROTEINUR >300* 05/14/2014 0808   UROBILINOGEN 0.2 05/14/2014 0808   NITRITE NEGATIVE 05/14/2014 0808   LEUKOCYTESUR TRACE* 05/14/2014 0808    Imaging results:   Dg Chest 2 View  07/29/2014   CLINICAL DATA:  54 year old female with generalized chest pain.  Has missed dialysis recently. Initial encounter.  EXAM: CHEST  2 VIEW  COMPARISON:  05/16/2014 and earlier.  FINDINGS: Similar low lung volumes. Stable  cardiomegaly and mediastinal contours. No pneumothorax. No pleural effusion or pulmonary edema. No consolidation. Mild linear scarring or atelectasis in the left mid lung. Stable right axillary surgical clips. No acute osseous abnormality identified.  IMPRESSION: Stable cardiomegaly. No acute cardiopulmonary abnormality.   Electronically Signed   By: Genevie Ann M.D.   On: 07/29/2014 13:09     EKG: Sinus rhythm without any acute ST-T wave changes   Assessment & Plan  Active Problems:   Acute respiratory failure/COPD exacerbation -Admit to nephrology floor -Add duo nebs -Avoid steroids given patient's history of psychosis    Symptomatic anemia -Unclear etiology -Currently on iron as well as Aranesp with dialysis treatments but patient also missing dialysis treatments at times -We'll check an anemia panel and proceed with 1 unit packed red blood cells -Check stools for blood ; patient with upper abdominal discomfort so if Hemoccult positive we'll need to consult GI -There could also be a degree of dilution from mild hypervolemia and setting of missed dialysis sessions    HTN (hypertension) -Blood pressure soft so we'll hold home medications    Hyperkalemia/CKD stage V requiring chronic dialysis -Nephrology notified of admission -Has missed 2 dialysis treatments and will need dialysis today -She may or may not be experiencing orthopnea   Seizure disorder -Continue Depakote    Tobacco use disorder -Nicotine patch   DVT Prophylaxis: Subcutaneous heparin  Family Communication:   No family at bedside  Code Status:  Full code  Condition:  Stable  Time spent in minutes : 60   ELLIS,ALLISON L. ANP on 07/29/2014 at 3:26 PM  Between 7am to 7pm - Pager - [phone]  After 7pm go to [email or web address] - password [email or web address]  And look for the night coverage person covering me after hours  [facility]

## 2014-07-29 NOTE — Progress Notes (Signed)
[facility] Renal Consultation Note  Indication for Consultation:  Management of ESRD/hemodialysis; anemia, hypertension/volume and secondary hyperparathyroidism  HPI: TAHIRY Lawson is a 54 y.o. female brought to the ER per Daughter"s reported history of weakness,missed HD today and  Her last HD Saturday= only 1 hr 2mn after signing off early to smoke .She is noncompliant with outpt HD only staying her entire 4 hr time once in March . Outpt hgb low at 6.6 07/24/14  Down form 9.6 on  07-17-14 and 11.9 on 07-10-14 re portly asymptomatic Sat  and signed off early to smoke ,told to come to ER with weakness and now appears.  has history of Bipolar disorder see below Chart  Review.She tells me she uses O2 at home and CPAP machine  at night. Denies fevers,chills, some sob,and chest discomfort with couging , denies n,v,d or  Dark tarry stools.  Chart review: 10/13 - drug overdose, bipolar, AKI, CKD, HTN 3/14 - drug OD, bipolar , HTN 71/63- salicylate overdose, biploar, HTN 9/14 - uncont HTN, CKD, SOB 12/14 - acute resp failure, CKD< HTN, tobacco, high K, diast HF, copd 2/15 - AMS, drug OD, bipolar, AKI, resp failure A/C 5/15 - psychosis, COPD, uncont HTN, CKD, Major depression, severe w psychosis 9/15 - AMS, A/C renal failure, asp PNA 11/15 - bipolar/ severe depression - improved w rehab stay   Past Medical History  Diagnosis Date  . Mental disorder   . Depression   . Hypertension   . Overdose   . Tobacco use disorder 11/13/2012  . Complication of anesthesia     difficulty going to sleep  . Chronic kidney disease     06/11/13- not on dialysis  . Shortness of breath     lying down flat  . PTSD (post-traumatic stress disorder)   . Asthma   . COPD (chronic obstructive pulmonary disease)   . Heart murmur   . GERD (gastroesophageal reflux disease)   . Seizures     "passsed out"  . History of blood transfusion   . Diabetes mellitus without complication     denies    Past  Surgical History  Procedure Laterality Date  . Right knee replacement      she says it was last year.  . Esophagogastroduodenoscopy Left 11/14/2012    Procedure: ESOPHAGOGASTRODUODENOSCOPY (EGD);  Surgeon: JJuanita Craver MD;  Location: [facility];  Service: Endoscopy;  Laterality: Left;  . Joint replacement Right     knee  . Parathyroidectomy    . Av fistula placement Right 06/12/2013    Procedure: ARTERIOVENOUS (AV) FISTULA CREATION; RIGHT  BASILIC VEIN TRANSPOSITION with Intraoperative ultrasound;  Surgeon: JMal [name] MD;  [facility];  Service: Vascular;  Laterality: Right;      Family History  Problem Relation Age of Onset  . Diabetes Mother   . Hyperlipidemia Mother   . Hypertension Mother   . Diabetes Father   . Hypertension Father   . Hyperlipidemia Father    Social = lives with daughter    reports that she has been smoking Cigarettes and Cigars.  She has a 80 pack-year smoking history. She has never used smokeless tobacco. She reports that she does not drink alcohol or use illicit drugs.   Allergies  Allergen Reactions  . Codeine Sulfate Anaphylaxis    Daughter called about having this allergy   . Haldol [Haloperidol Lactate] Shortness Of Breath  . Risperidone And Related Shortness Of Breath  . Gabapentin Other (See Comments)  seizures  . Invega [Paliperidone Er] Nausea And Vomiting  . Trazodone And Nefazodone Other (See Comments)    Makes pt loose balance and fall  . Vistaril [Hydroxyzine Hcl] Nausea And Vomiting    Prior to Admission medications   Medication Sig Start Date End Date Taking? Authorizing Provider  acetaminophen (TYLENOL) 325 MG tablet Take 2 tablets (650 mg total) by mouth every 4 (four) hours as needed for fever, headache or mild pain. 05/20/14  Yes Bynum Bellows, MD  albuterol (PROVENTIL) (2.5 MG/3ML) 0.083% nebulizer solution Take 3 mLs (2.5 mg total) by nebulization every 4 (four) hours as needed for wheezing or shortness of breath.  04/03/14  Yes Encarnacion Slates, NP  bisacodyl (DULCOLAX) 5 MG EC tablet Take 1 tablet (5 mg total) by mouth daily as needed for moderate constipation. 05/20/14  Yes Bynum Bellows, MD  Darbepoetin Alfa (ARANESP) 60 MCG/0.3ML SOSY injection Inject 0.3 mLs (60 mcg total) into the vein every Thursday with hemodialysis. 05/20/14  Yes Srikar Janna Arch, MD  divalproex (DEPAKOTE) 250 MG DR tablet Take 750 mg by mouth daily.   Yes Historical Provider, MD  ferric gluconate 250 mg in sodium chloride 0.9 % 100 mL Inject 250 mg into the vein every Tuesday, Thursday, Saturday, and Sunday. 05/20/14  Yes Srikar Janna Arch, MD  Fluticasone-Salmeterol (ADVAIR) 100-50 MCG/DOSE AEPB Inhale 1 puff into the lungs 2 (two) times daily. For asthma 04/03/14  Yes Encarnacion Slates, NP  metoprolol tartrate (LOPRESSOR) 25 MG tablet Take 0.5 tablets (12.5 mg total) by mouth 2 (two) times daily. For HTN 05/20/14  Yes Srikar Janna Arch, MD  multivitamin (RENA-VIT) TABS tablet Take 1 tablet by mouth at bedtime. 05/20/14  Yes Srikar Janna Arch, MD  nicotine (NICODERM CQ - DOSED IN MG/24 HOURS) 21 mg/24hr patch Place 1 patch (21 mg total) onto the skin daily. 05/20/14  Yes Srikar Janna Arch, MD  Nutritional Supplements (FEEDING SUPPLEMENT, NEPRO CARB STEADY,) LIQD Take 237 mLs by mouth as needed (missed meal during dialysis.). 05/20/14  Yes Srikar Janna Arch, MD  PROAIR HFA 108 (90 BASE) MCG/ACT inhaler Inhale 1 puff into the lungs every 4 (four) hours as needed. wheezing 07/19/14  Yes Historical Provider, MD  simvastatin (ZOCOR) 10 MG tablet Take 1 tablet (10 mg total) by mouth at bedtime. For high cholesterol/fats 04/03/14  Yes Encarnacion Slates, NP  ziprasidone (GEODON) 60 MG capsule Take 1 capsule (60 mg total) by mouth 2 (two) times daily with a meal. For mood control Patient taking differently: Take 60 mg by mouth 2 (two) times daily. For mood control 04/03/14  Yes Encarnacion Slates, NP  divalproex (DEPAKOTE) 500 MG DR tablet Take 1 tablet (500 mg total) by mouth every  12 (twelve) hours. 05/20/14   Srikar Janna Arch, MD  docusate sodium 100 MG CAPS Take 100 mg by mouth 2 (two) times daily. 05/20/14   Bynum Bellows, MD    PRN:  Results for orders placed or performed during the hospital encounter of 07/29/14 (from the past 48 hour(s))  Magnesium     Status: Abnormal   Collection Time: 07/29/14 12:00 PM  Result Value Ref Range   Magnesium 2.8 (H) 1.5 - 2.5 mg/dL  Phosphorus     Status: Abnormal   Collection Time: 07/29/14 12:00 PM  Result Value Ref Range   Phosphorus 5.7 (H) 2.3 - 4.6 mg/dL  I-stat troponin, ED (not at [facility])     Status: None   Collection Time:  07/29/14 12:04 PM  Result Value Ref Range   Troponin i, poc 0.01 0.00 - 0.08 ng/mL   Comment 3            Comment: Due to the release kinetics of cTnI, a negative result within the first hours of the onset of symptoms does not rule out myocardial infarction with certainty. If myocardial infarction is still suspected, repeat the test at appropriate intervals.   CBC     Status: Abnormal   Collection Time: 07/29/14  1:05 PM  Result Value Ref Range   WBC 7.1 4.0 - 10.5 K/uL   RBC 2.14 (L) 3.87 - 5.11 MIL/uL   Hemoglobin 6.7 (LL) 12.0 - 15.0 g/dL    Comment: REPEATED TO VERIFY CRITICAL RESULT CALLED TO, READ BACK BY AND VERIFIED WITH: K COBB,RN AT 1425 07/29/14 BY K BARR    HCT 23.0 (L) 36.0 - 46.0 %   MCV 107.5 (H) 78.0 - 100.0 fL   MCH 31.3 26.0 - 34.0 pg   MCHC 29.1 (L) 30.0 - 36.0 g/dL   RDW 19.1 (H) 11.5 - 15.5 %   Platelets 477 (H) 150 - 400 K/uL  Basic metabolic panel     Status: Abnormal   Collection Time: 07/29/14  1:05 PM  Result Value Ref Range   Sodium 144 135 - 145 mmol/L   Potassium 5.6 (H) 3.5 - 5.1 mmol/L   Chloride 111 96 - 112 mmol/L   CO2 20 19 - 32 mmol/L   Glucose, Bld 153 (H) 70 - 99 mg/dL   BUN 38 (H) 6 - 23 mg/dL   Creatinine, Ser 4.87 (H) 0.50 - 1.10 mg/dL   Calcium 10.1 8.4 - 10.5 mg/dL   GFR calc non Af Amer 9 (L) >90 mL/min   GFR calc Af Amer 11 (L) >90  mL/min    Comment: (NOTE) The eGFR has been calculated using the CKD EPI equation. This calculation has not been validated in all clinical situations. eGFR's persistently <90 mL/min signify possible Chronic Kidney Disease.    Anion gap 13 5 - 15  Type and screen     Status: None (Preliminary result)   Collection Time: 07/29/14  1:05 PM  Result Value Ref Range   ABO/RH(D) A POS    Antibody Screen NEG    Sample Expiration 08/01/2014    Unit Number [id number]    Blood Component Type RED CELLS,LR    Unit division 00    Status of Unit ALLOCATED    Transfusion Status OK TO TRANSFUSE    Crossmatch Result Compatible   Reticulocytes     Status: Abnormal   Collection Time: 07/29/14  1:05 PM  Result Value Ref Range   Retic Ct Pct 6.5 (H) 0.4 - 3.1 %   RBC. 2.14 (L) 3.87 - 5.11 MIL/uL   Retic Count, Manual 139.1 19.0 - 186.0 K/uL  ABO/Rh     Status: None   Collection Time: 07/29/14  1:05 PM  Result Value Ref Range   ABO/RH(D) A POS   Prepare RBC     Status: None   Collection Time: 07/29/14  2:52 PM  Result Value Ref Range   Order Confirmation ORDER PROCESSED BY BLOOD BANK      ROS: see hpi  Physical Exam: Filed Vitals:   07/29/14 1545  BP: 123/65  Pulse: 90  Temp:   Resp: 23     General: Alert bf NAD on hd, appears appropriate currently, with O2 HEENT: [place], MMM, nonicteric Neck:  supple, no jvd Heart: RRR, no rub, mur , gallop Lungs:  decr bs at BAses and wheezing exp Abdomen:  bs pos soft, nt, nd  Extremities:  Trace bipedal edema Skin:  No overt rash or pedal ulcers Neuro: alert OX3, moves all extrem Dialysis Access: Patent R UA AVF on hd  Dialysis Orders: Center: [facility]  on  tts . EDW 82.5kg HD Bath 2.0k, 2.0 ca  Time 4hrs Heparin 6000. Access R U A AVF BFR 400 DFR 800    Hectorol 1 mcg IV/HD  ARANESP 120 mcg inv q weekly  recently 80  Given 07/24/14    Units IV/HD  Venofer  144m x 10 to finish 07/29/14  Other op lambs ca 9.0 , phos 5.8 pth 224 hgb see  hpi  Assessment/Plan 1. SOB / cough - multifactorial = anemia,, Volume overload with missed HD/and  early sign offs= hd to 3.5 l transfuse PRBCS exam / CXR (clear), bronchitic cough, suspect acute viral / bact URI , possibly flu, don't see any pulm edema 2. ESRD -  HD TTS ( [facility]) k 5.6  3. Hypertension/volume  -   bp 123/65 /uf today needs lower edw / 4. Anemia  -  Aranesp on hd and fe , hgb low sec to ckd and missing hd   5. Metabolic bone disease -  Binders and vit d 6. Bipolar Disease/ ho Depression - meds 7. Noncompliance with HD attend ence - dw pt "Ill do better" 8. COPD/Tob abuse  9. OSA - CPAP/ o2  DErnest Haber PA-C [id number]/22/2016, 4:04 PM   Pt seen, examined, agree w assess/plan as above with additions as indicated. ESRD patient misses / signs off early w HD frequently. Long hx psych illness w depression/ bipolar, multiple drug overdoses in the past.  Now here with SOB and cough after missing HD.  Suspect URI, cxr clear. Plan HD today.  RKelly SplinterMD pager [phone]   cell [phone]/22/2016, 4:52 PM

## 2014-07-29 NOTE — ED Notes (Signed)
IV attempted x 2-- in veins both times, infiltrated with saline flush.

## 2014-07-29 NOTE — ED Notes (Signed)
Pt reports missed dialysis 2 sessions; last dialyzed on Thursday. Center reports low hemoglobin of 5 and needs blood transfusion. C/o cp for 2 months ago. Pt has productive cough noted.

## 2014-07-29 NOTE — ED Notes (Addendum)
Pt states that she is hungry --- requesting food--needs geodon when she gets to eat -- per pt.  Requesting O2 on at 2l/m/  [name] -- [phone] daughter--

## 2014-07-29 NOTE — Consult Note (Signed)
Chart review: 10/13 - drug overdose, bipolar, AKI, CKD, HTN 3/14 - drug OD, bipolar , HTN 8/14 - salicylate overdose, biploar, HTN 9/14 - uncont HTN, CKD, SOB 12/14 - acute resp failure, CKD< HTN, tobacco, high K, diast HF, copd 2/15 - AMS, drug OD, bipolar, AKI, resp failure A/C 5/15 - psychosis, COPD, uncont HTN, CKD, Major depression, severe w psychosis 9/15 - AMS, A/C renal failure, asp PNA 11/15 - bipolar/ severe depression - improved w rehab stay  Kelly Splinter MD (pgr) [phone]    ([phone] 07/29/2014, 3:34 PM

## 2014-07-30 DIAGNOSIS — Z992 Dependence on renal dialysis: Secondary | ICD-10-CM

## 2014-07-30 LAB — FOLATE
Folate: 11.6 ng/mL
Folate: 7.5 ng/mL

## 2014-07-30 LAB — TROPONIN I
Troponin I: <0.03 ng/mL (ref <0.031)
Troponin I: <0.03 ng/mL (ref <0.031)
Troponin I: <0.03 ng/mL (ref <0.031)

## 2014-07-30 LAB — CBC
HCT: 26.8 % — ABNORMAL LOW (ref 36.0–46.0)
Hemoglobin: 8.1 g/dL — ABNORMAL LOW (ref 12.0–15.0)
MCH: 30.8 pg (ref 26.0–34.0)
MCHC: 30.2 g/dL (ref 30.0–36.0)
MCV: 101.9 fL — ABNORMAL HIGH (ref 78.0–100.0)
Platelets: 408 10*3/uL — ABNORMAL HIGH (ref 150–400)
RBC: 2.63 MIL/uL — ABNORMAL LOW (ref 3.87–5.11)
RDW: 21.6 % — ABNORMAL HIGH (ref 11.5–15.5)
WBC: 5.6 10*3/uL (ref 4.0–10.5)

## 2014-07-30 LAB — IRON AND TIBC
Iron: 65 ug/dL (ref 42–145)
Saturation Ratios: 26 % (ref 20–55)
TIBC: 254 ug/dL (ref 250–470)
UIBC: 189 ug/dL (ref 125–400)

## 2014-07-30 LAB — BASIC METABOLIC PANEL
Anion gap: 8 (ref 5–15)
BUN: 14 mg/dL (ref 6–23)
CO2: 29 mmol/L (ref 19–32)
Calcium: 9.9 mg/dL (ref 8.4–10.5)
Chloride: 104 mmol/L (ref 96–112)
Creatinine, Ser: 2.8 mg/dL — ABNORMAL HIGH (ref 0.50–1.10)
GFR calc Af Amer: 21 mL/min — ABNORMAL LOW (ref >90)
GFR calc non Af Amer: 18 mL/min — ABNORMAL LOW (ref >90)
Glucose, Bld: 78 mg/dL (ref 70–99)
Potassium: 4.2 mmol/L (ref 3.5–5.1)
Sodium: 141 mmol/L (ref 135–145)

## 2014-07-30 LAB — FERRITIN
Ferritin: 379 ng/mL — ABNORMAL HIGH (ref 10–291)
Ferritin: 314 ng/mL — ABNORMAL HIGH (ref 10–291)

## 2014-07-30 LAB — VITAMIN B12
Vitamin B-12: 794 pg/mL (ref 211–911)
Vitamin B-12: 763 pg/mL (ref 211–911)

## 2014-07-30 LAB — HEPATITIS B SURFACE ANTIGEN: Hepatitis B Surface Ag: NEGATIVE

## 2014-07-30 LAB — MRSA PCR SCREENING: MRSA by PCR: NEGATIVE

## 2014-07-30 MED ORDER — ZIPRASIDONE HCL 60 MG PO CAPS
60.0000 mg | ORAL_CAPSULE | Freq: Two times a day (BID) | ORAL | Status: DC
  Administered 2014-07-30 – 2014-08-01 (×5): 60 mg via ORAL
  Filled 2014-07-30 (×7): qty 1

## 2014-07-30 MED ORDER — PANTOPRAZOLE SODIUM 40 MG PO TBEC
40.0000 mg | DELAYED_RELEASE_TABLET | Freq: Every day | ORAL | Status: DC
  Administered 2014-07-30 – 2014-08-01 (×3): 40 mg via ORAL
  Filled 2014-07-30 (×2): qty 1

## 2014-07-30 MED ORDER — NEPRO/CARBSTEADY PO LIQD
237.0000 mL | Freq: Every day | ORAL | Status: DC
  Administered 2014-07-30 – 2014-07-31 (×2): 237 mL via ORAL

## 2014-07-30 MED ORDER — ALBUTEROL SULFATE (2.5 MG/3ML) 0.083% IN NEBU: 2.5000 mg | INHALATION_SOLUTION | RESPIRATORY_TRACT | Status: DC | PRN

## 2014-07-30 MED ORDER — IPRATROPIUM-ALBUTEROL 0.5-2.5 (3) MG/3ML IN SOLN
3.0000 mL | Freq: Four times a day (QID) | RESPIRATORY_TRACT | Status: DC
  Administered 2014-07-31 – 2014-08-01 (×4): 3 mL via RESPIRATORY_TRACT
  Filled 2014-07-30 (×4): qty 3

## 2014-07-30 MED ORDER — SUCRALFATE 1 GM/10ML PO SUSP
1.0000 g | Freq: Three times a day (TID) | ORAL | Status: DC
  Administered 2014-07-30 – 2014-08-01 (×9): 1 g via ORAL
  Filled 2014-07-30 (×12): qty 10

## 2014-07-30 MED ORDER — DIVALPROEX SODIUM 500 MG PO DR TAB
750.0000 mg | DELAYED_RELEASE_TABLET | Freq: Every day | ORAL | Status: DC
  Administered 2014-07-30 – 2014-08-01 (×3): 750 mg via ORAL
  Filled 2014-07-30 (×4): qty 1

## 2014-07-30 NOTE — Progress Notes (Signed)
INITIAL NUTRITION ASSESSMENT  DOCUMENTATION CODES Per approved criteria  -Obesity Unspecified   INTERVENTION: Provide Nepro Shake po once daily, each supplement provides 425 kcal and 19 grams protein.  Encourage adequate PO intake.  NUTRITION DIAGNOSIS: Increased nutrient needs related to chronic illness, ESRD as evidenced by estimated nutrition needs.   Goal: Pt to meet >/= 90% of their estimated nutrition needs   Monitor:  PO intake, weight trends, labs, I/O's  Reason for Assessment: MST  54 y.o. female  Admitting Dx: <principal problem not specified>  ASSESSMENT: Pt with history of ESRD, bipolar disorder-depression-long-standing history of noncompliance to dialysis, COPD presenting with shortness of breath.   Pt reports having a good appetite currently and PTA at home eating at least 2-3 full meals. She does report however her appetite has been coming and going. Pt reports usual body weight has been ~172 lbs, however per Epic weight records, pt with a 12.8% weight loss in 3 months. Current meal completion is 75%. Pt is agreeable to trying Nepro Shake. RD to order. Pt was educated to continue supplementation at home if appetite is down and intake is poor. During time of visit, pt reported she was hungry and requested a snack at that time (crackers and cran juice were brought to bedside).  Pt with no observed significant fat or muscle mass loss.  Labs: Low GFR. High creatinine.  Height: Ht Readings from Last 1 Encounters:  07/29/14 5\' 2"  (1.575 m)    Weight: Wt Readings from Last 1 Encounters:  07/29/14 177 lb 4 oz (80.4 kg)    Ideal Body Weight: 110 lbs  % Ideal Body Weight: 161%  Wt Readings from Last 10 Encounters:  07/29/14 177 lb 4 oz (80.4 kg)  05/19/14 182 lb 12.2 oz (82.9 kg)  04/12/14 203 lb (92.08 kg)  04/01/14 186 lb (84.369 kg)  03/12/14 184 lb (83.462 kg)  02/05/14 184 lb (83.462 kg)  01/20/14 192 lb 7.4 oz (87.3 kg)  09/24/13 195 lb (88.451 kg)   06/30/13 225 lb 8.5 oz (102.3 kg)  06/11/13 213 lb (96.616 kg)    Usual Body Weight: 172 lbs per pt report  % Usual Body Weight: 103%  BMI:  Body mass index is 32.41 kg/(m^2). Class I obesity  Adjusted body weight: 75.55 kg  Estimated Nutritional Needs: Kcal: 1900-2100 Protein: 95-110 grams Fluid: 1.2 L/day  Skin: non-pitting LE edema  Diet Order: Diet renal W/1224mL fluid restriction  EDUCATION NEEDS: -Education needs addressed   Intake/Output Summary (Last 24 hours) at 07/30/14 0957 Last data filed at 07/30/14 0936  Gross per 24 hour  Intake   1255 ml  Output   2665 ml  Net  -1410 ml    Last BM: PTA  Labs:   Recent Labs Lab 07/29/14 1200 07/29/14 1305  NA  --  144  K  --  5.6*  CL  --  111  CO2  --  20  BUN  --  38*  CREATININE  --  4.87*  CALCIUM  --  10.1  MG 2.8*  --   PHOS 5.7*  --   GLUCOSE  --  153*    CBG (last 3)  No results for input(s): GLUCAP in the last 72 hours.  Scheduled Meds: . sodium chloride   Intravenous Once  . [START ON 07/31/2014] Darbepoetin Alfa  60 mcg Intravenous Q Thu-HD  . divalproex  750 mg Oral Q breakfast  . ferric gluconate (FERRLECIT/NULECIT) IV  250 mg Intravenous Q T,Th,S,Su  .  heparin  5,000 Units Subcutaneous 3 times per day  . ipratropium-albuterol  3 mL Nebulization Q4H  . mometasone-formoterol  2 puff Inhalation BID  . multivitamin  1 tablet Oral QHS  . nicotine  21 mg Transdermal Daily  . simvastatin  10 mg Oral QHS  . sodium chloride  3 mL Intravenous Q12H  . ziprasidone  60 mg Oral BID WC    Continuous Infusions:   Past Medical History  Diagnosis Date  . Mental disorder   . Depression   . Hypertension   . Overdose   . Tobacco use disorder 11/13/2012  . Complication of anesthesia     difficulty going to sleep  . Chronic kidney disease     06/11/13- not on dialysis  . Shortness of breath     lying down flat  . PTSD (post-traumatic stress disorder)   . Asthma   . COPD (chronic obstructive  pulmonary disease)   . Heart murmur   . GERD (gastroesophageal reflux disease)   . Seizures     "passsed out"  . History of blood transfusion   . Diabetes mellitus without complication     denies    Past Surgical History  Procedure Laterality Date  . Right knee replacement      she says it was last year.  . Esophagogastroduodenoscopy Left 11/14/2012    Procedure: ESOPHAGOGASTRODUODENOSCOPY (EGD);  Surgeon: [name], MD;  Location: [facility];  Service: Endoscopy;  Laterality: Left;  . Joint replacement Right     knee  . Parathyroidectomy    . Av fistula placement Right 06/12/2013    Procedure: ARTERIOVENOUS (AV) FISTULA CREATION; RIGHT  BASILIC VEIN TRANSPOSITION with Intraoperative ultrasound;  Surgeon: Mal [name], MD;  Location: [facility];  Service: Vascular;  Laterality: Right;    Kallie Locks, MS, RD, LDN Pager # [phone] After hours/ weekend pager # [phone]

## 2014-07-30 NOTE — Progress Notes (Addendum)
PROGRESS NOTE  Jennifer Lawson RAQ:[id number] DOB: 1960-06-18 DOA: 07/29/2014 PCP: [facility]  Assessment/Plan: Acute respiratory failure/COPD exacerbation -Admit to nephrology floor- suspect from fluid overload from missing dilaysis -Add duo nebs -Avoid steroids given patient's history of psychosis   Symptomatic anemia -Unclear etiology -Currently on iron as well as Aranesp with dialysis treatments but patient also missing dialysis treatments at times -packed red blood cells -Check stools for blood ; patient with upper abdominal discomfort so if Hemoccult positive we'll need to consult GI -There could also be a degree of dilution from mild hypervolemia and setting of missed dialysis sessions- await today's labs   HTN (hypertension)   Hyperkalemia/CKD stage V requiring chronic dialysis -Nephrology notified of admission -Has missed 2 dialysis treatments and will need dialysis today   Seizure disorder -Continue Depakote   Tobacco use disorder -Nicotine patch  Alcohol use -drinks "2 big boys" per day/beer  Chest pain -EKG done -troponin's ordered -suspect gastritis- as drinks beer and coffee all day- added Protonix and Carafate -EGD done in 2014 by Dr. Collene Mares showed gastritis  Code Status: full Family Communication: patient Disposition Plan:    Consultants:  neprho  Procedures:     HPI/Subjective: Asking for multiple cups of coffee C/o upper abd/lower chest pain   Objective: Filed Vitals:   07/30/14 0949  BP: 120/66  Pulse: 98  Temp: 98.9 F (37.2 C)  Resp: 18    Intake/Output Summary (Last 24 hours) at 07/30/14 1012 Last data filed at 07/30/14 0936  Gross per 24 hour  Intake   1255 ml  Output   2665 ml  Net  -1410 ml   Filed Weights   07/29/14 1600 07/29/14 2016  Weight: 82.9 kg (182 lb 12.2 oz) 80.4 kg (177 lb 4 oz)    Exam:   General:  Awake, pleasant  Cardiovascular: rrr  Respiratory: mild exp wheezing  Abdomen:  +BS, tender to palpation  Musculoskeletal: moves all 4 ext   Data Reviewed: Basic Metabolic Panel:  Recent Labs Lab 07/29/14 1200 07/29/14 1305  NA  --  144  K  --  5.6*  CL  --  111  CO2  --  20  GLUCOSE  --  153*  BUN  --  38*  CREATININE  --  4.87*  CALCIUM  --  10.1  MG 2.8*  --   PHOS 5.7*  --    Liver Function Tests: No results for input(s): AST, ALT, ALKPHOS, BILITOT, PROT, ALBUMIN in the last 168 hours. No results for input(s): LIPASE, AMYLASE in the last 168 hours. No results for input(s): AMMONIA in the last 168 hours. CBC:  Recent Labs Lab 07/29/14 1305  WBC 7.1  HGB 6.7*  HCT 23.0*  MCV 107.5*  PLT 477*   Cardiac Enzymes: No results for input(s): CKTOTAL, CKMB, CKMBINDEX, TROPONINI in the last 168 hours. BNP (last 3 results) No results for input(s): BNP in the last 8760 hours.  ProBNP (last 3 results) No results for input(s): PROBNP in the last 8760 hours.  CBG: No results for input(s): GLUCAP in the last 168 hours.  Recent Results (from the past 240 hour(s))  MRSA PCR Screening     Status: None   Collection Time: 07/30/14  5:40 AM  Result Value Ref Range Status   MRSA by PCR NEGATIVE NEGATIVE Final    Comment:        The GeneXpert MRSA Assay (FDA approved for NASAL specimens only), is one component of a comprehensive  MRSA colonization surveillance program. It is not intended to diagnose MRSA infection nor to guide or monitor treatment for MRSA infections.      Studies: Dg Chest 2 View  07/29/2014   CLINICAL DATA:  54 year old female with generalized chest pain. Has missed dialysis recently. Initial encounter.  EXAM: CHEST  2 VIEW  COMPARISON:  05/16/2014 and earlier.  FINDINGS: Similar low lung volumes. Stable cardiomegaly and mediastinal contours. No pneumothorax. No pleural effusion or pulmonary edema. No consolidation. Mild linear scarring or atelectasis in the left mid lung. Stable right axillary surgical clips. No acute osseous  abnormality identified.  IMPRESSION: Stable cardiomegaly. No acute cardiopulmonary abnormality.   Electronically Signed   By: Genevie Ann M.D.   On: 07/29/2014 13:09    Scheduled Meds: . sodium chloride   Intravenous Once  . [START ON 07/31/2014] Darbepoetin Alfa  60 mcg Intravenous Q Thu-HD  . divalproex  750 mg Oral Q breakfast  . ferric gluconate (FERRLECIT/NULECIT) IV  250 mg Intravenous Q T,Th,S,Su  . heparin  5,000 Units Subcutaneous 3 times per day  . ipratropium-albuterol  3 mL Nebulization Q4H  . mometasone-formoterol  2 puff Inhalation BID  . multivitamin  1 tablet Oral QHS  . nicotine  21 mg Transdermal Daily  . pantoprazole  40 mg Oral Daily  . simvastatin  10 mg Oral QHS  . sodium chloride  3 mL Intravenous Q12H  . sucralfate  1 g Oral TID WC & HS  . ziprasidone  60 mg Oral BID WC   Continuous Infusions:  Antibiotics Given (last 72 hours)    None      Active Problems:   HTN (hypertension)   Tobacco use disorder   Hyperkalemia   Acute respiratory failure   COPD exacerbation   CKD (chronic kidney disease) stage V requiring chronic dialysis   Symptomatic anemia   Absolute anemia    Time spent: 25 min    Tyechia Allmendinger, [facility] Pager [phone]. If 7PM-7AM, please contact night-coverage at [email or web address], password [facility] 07/30/2014, 10:12 AM  LOS: 1 day

## 2014-07-30 NOTE — Progress Notes (Signed)
Subjective:  No sob,tolerated HD yest. / now reports chest pain "squeezing type  been there for about 2 months/but not present yesterday"  Objective Vital signs in last 24 hours: Filed Vitals:   07/29/14 2035 07/29/14 2145 07/30/14 0500 07/30/14 0815  BP: 124/73  128/90 127/82  Pulse: 94 88 85 86  Temp: 99.5 F (37.5 C)  97.6 F (36.4 C) 98.7 F (37.1 C)  TempSrc: Oral  Oral Oral  Resp: 20 20 22 18   Height: 5\' 2"  (1.575 m)     Weight:      SpO2: 96%  92% 93%   Weight change:   Physical Exam: General: Alert , talkative and In NAD  Heart: RRR, no rub, mur , gallop Lungs: decr bs at BAses and wheezing exp decr from admit Abdomen: bs pos soft, nt, nd  Extremities: no pedal  edema Dialysis Access:  R UA AVF  Pos bruit   CXR  No acute disease  OP Dialysis Orders: Center: [facility] on tts . EDW 82.5kg HD Bath 2.0k, 2.0 ca Time 4hrs Heparin 6000. Access R U A AVF BFR 400 DFR 800  Hectorol 1 mcg IV/HD ARANESP 120 mcg inv q weekly recently 80 Given 07/24/14 Units IV/HD Venofer 100mg  x 10 to finish 07/29/14  Other op lambs ca 9.0 , phos 5.8 pth 224 hgb see hpi   Problem/Plan: 1. SOB / cough - multifactorial =  COPD in setting of missing hd tx time/ Viral uri/anemia,(stool for hem. pend)= uf 2665 yest below edw 2 kg,. transfused 2 u prbcs   2. Chest pain- admit team wu, currently not in  Distress/ tells me present for 70months 3. ESRD - HD TTS ( [facility]) next hd in am  4. Hypertension/volume - bp 127/82 lower edw at dc  5. Anemia - admit hgb 6.7  Am pending sp transfusion 2 units PRBCs yesterday/ Aranesp on hd and fe load TFS 26%.   6. Metabolic bone disease - phos 5.7 on admit  Ca = 10.1. Binders and vit d 7. Bipolar Disease/ ho Depression - meds 8. Noncompliance with HD attend ence - dw pt "Ill do better" 9. COPD/Tob abuse "for 42 years" 10. OSA - CPAP at home she tells me and using  O2 at home   Ernest Haber, PA-C [facility]  [phone] 07/30/2014,8:45 AM  LOS: 1 day   Pt seen, examined, agree w assess/plan as above with additions as indicated.  Kelly Splinter MD pager [phone]    cell [phone] 07/30/2014, 11:55 AM     Labs: Basic Metabolic Panel:  Recent Labs Lab 07/29/14 1200 07/29/14 1305  NA  --  144  K  --  5.6*  CL  --  111  CO2  --  20  GLUCOSE  --  153*  BUN  --  38*  CREATININE  --  4.87*  CALCIUM  --  10.1  PHOS 5.7*  --     Recent Labs Lab 07/29/14 1305  WBC 7.1  HGB 6.7*  HCT 23.0*  MCV 107.5*  PLT 477*     Studies/Results: Dg Chest 2 View  07/29/2014   CLINICAL DATA:  54 year old female with generalized chest pain. Has missed dialysis recently. Initial encounter.  EXAM: CHEST  2 VIEW  COMPARISON:  05/16/2014 and earlier.  FINDINGS: Similar low lung volumes. Stable cardiomegaly and mediastinal contours. No pneumothorax. No pleural effusion or pulmonary edema. No consolidation. Mild linear scarring or atelectasis in the left mid lung. Stable right  axillary surgical clips. No acute osseous abnormality identified.  IMPRESSION: Stable cardiomegaly. No acute cardiopulmonary abnormality.   Electronically Signed   By: Genevie Ann M.D.   On: 07/29/2014 13:09   Medications:   . sodium chloride   Intravenous Once  . [START ON 07/31/2014] Darbepoetin Alfa  60 mcg Intravenous Q Thu-HD  . divalproex  750 mg Oral Daily  . ferric gluconate (FERRLECIT/NULECIT) IV  250 mg Intravenous Q T,Th,S,Su  . heparin  5,000 Units Subcutaneous 3 times per day  . ipratropium-albuterol  3 mL Nebulization Q4H  . mometasone-formoterol  2 puff Inhalation BID  . multivitamin  1 tablet Oral QHS  . nicotine  21 mg Transdermal Daily  . simvastatin  10 mg Oral QHS  . sodium chloride  3 mL Intravenous Q12H  . ziprasidone  60 mg Oral BID

## 2014-07-30 NOTE — Progress Notes (Signed)
Paged Dr. Eliseo Squires EKG, nonspecific ST and T wave abnomality, FYI

## 2014-07-30 NOTE — Progress Notes (Signed)
Pt co chest pressure and squeezing sensation paged Dr. Eliseo Squires VS 127/82hr 89 o2 sat 93 RA, T/O EKG, pt asymptomatic to chest pressure and squeezing sensation.  Pt resting reading paper.

## 2014-07-30 NOTE — Progress Notes (Signed)
UR Completed.  [phone]  

## 2014-07-31 DIAGNOSIS — D631 Anemia in chronic kidney disease: Secondary | ICD-10-CM

## 2014-07-31 DIAGNOSIS — N189 Chronic kidney disease, unspecified: Secondary | ICD-10-CM

## 2014-07-31 LAB — RENAL FUNCTION PANEL
Albumin: 2.5 g/dL — ABNORMAL LOW (ref 3.5–5.2)
Anion gap: 7 (ref 5–15)
BUN: 23 mg/dL (ref 6–23)
CO2: 25 mmol/L (ref 19–32)
Calcium: 10 mg/dL (ref 8.4–10.5)
Chloride: 105 mmol/L (ref 96–112)
Creatinine, Ser: 3.54 mg/dL — ABNORMAL HIGH (ref 0.50–1.10)
GFR calc Af Amer: 16 mL/min — ABNORMAL LOW (ref >90)
GFR calc non Af Amer: 14 mL/min — ABNORMAL LOW (ref >90)
Glucose, Bld: 118 mg/dL — ABNORMAL HIGH (ref 70–99)
Phosphorus: 4.6 mg/dL (ref 2.3–4.6)
Potassium: 4 mmol/L (ref 3.5–5.1)
Sodium: 137 mmol/L (ref 135–145)

## 2014-07-31 LAB — CBC
HCT: 24.7 % — ABNORMAL LOW (ref 36.0–46.0)
Hemoglobin: 7.5 g/dL — ABNORMAL LOW (ref 12.0–15.0)
MCH: 31.4 pg (ref 26.0–34.0)
MCHC: 30.4 g/dL (ref 30.0–36.0)
MCV: 103.3 fL — ABNORMAL HIGH (ref 78.0–100.0)
Platelets: 330 10*3/uL (ref 150–400)
RBC: 2.39 MIL/uL — ABNORMAL LOW (ref 3.87–5.11)
RDW: 20.5 % — ABNORMAL HIGH (ref 11.5–15.5)
WBC: 7.7 10*3/uL (ref 4.0–10.5)

## 2014-07-31 LAB — PREPARE RBC (CROSSMATCH)

## 2014-07-31 LAB — OCCULT BLOOD X 1 CARD TO LAB, STOOL: Fecal Occult Bld: NEGATIVE

## 2014-07-31 MED ORDER — DARBEPOETIN ALFA 100 MCG/0.5ML IJ SOSY
100.0000 ug | PREFILLED_SYRINGE | INTRAMUSCULAR | Status: DC
  Administered 2014-07-31: 100 ug via INTRAVENOUS

## 2014-07-31 MED ORDER — DARBEPOETIN ALFA 60 MCG/0.3ML IJ SOSY
PREFILLED_SYRINGE | INTRAMUSCULAR | Status: AC
  Administered 2014-07-31: 60 ug via INTRAVENOUS
  Filled 2014-07-31: qty 0.3

## 2014-07-31 MED ORDER — SODIUM CHLORIDE 0.9 % IV SOLN: Freq: Once | INTRAVENOUS | Status: DC

## 2014-07-31 MED ORDER — DARBEPOETIN ALFA 100 MCG/0.5ML IJ SOSY
PREFILLED_SYRINGE | INTRAMUSCULAR | Status: AC
  Administered 2014-07-31: 100 ug via INTRAVENOUS
  Filled 2014-07-31: qty 0.5

## 2014-07-31 MED ORDER — SODIUM CHLORIDE 0.9 % IV SOLN: Freq: Once | INTRAVENOUS | Status: AC

## 2014-07-31 MED ORDER — HYDROCODONE-ACETAMINOPHEN 5-325 MG PO TABS
1.0000 | ORAL_TABLET | Freq: Two times a day (BID) | ORAL | Status: DC | PRN
  Administered 2014-08-01 (×2): 1 via ORAL
  Filled 2014-07-31 (×2): qty 1

## 2014-07-31 MED ORDER — ZOLPIDEM TARTRATE 5 MG PO TABS
5.0000 mg | ORAL_TABLET | Freq: Once | ORAL | Status: AC
  Administered 2014-08-01: 5 mg via ORAL
  Filled 2014-07-31: qty 1

## 2014-07-31 NOTE — Progress Notes (Signed)
PROGRESS NOTE  Jennifer Lawson BTD:[id number] DOB: Jun 19, 1960 DOA: 07/29/2014 PCP: [facility]  Assessment/Plan: Acute respiratory failure/COPD exacerbation No wheeze currently. Also c/o bandlike pleuritic CP x2 months. Doubt cardiac. Will get CTA chest r/o PI   Symptomatic anemia Got another unit prbc today. Stool not sent for hemoccult. Nephrology recommends rechecking h/h tomorrow   HTN (hypertension)   Hyperkalemia/CKD stage V requiring chronic dialysis Dialyzed today  Seizure disorder -Continue Depakote   Tobacco use disorder -Nicotine patch  Alcohol use -drinks "2 big boys" per day/beer  Chest pain -EKG done nonischemic -troponin's negative Could be gastritis, need to r/o PE, as pt dypspneic on admission  Code Status: full Family Communication: patient Disposition Plan: home tomorrow if h/h stable and PE w/u neg   Consultants:  neprho  Procedures:     HPI/Subjective: C/o pleuritic lower chest upper abd pain x2 months. No bleeding. Eating ok   Objective: Filed Vitals:   07/31/14 1401  BP: 112/60  Pulse: 98  Temp: 99.8 F (37.7 C)  Resp: 18    Intake/Output Summary (Last 24 hours) at 07/31/14 1433 Last data filed at 07/31/14 1306  Gross per 24 hour  Intake   1080 ml  Output   3000 ml  Net  -1920 ml   Filed Weights   07/31/14 0434 07/31/14 0848 07/31/14 1306  Weight: 80 kg (176 lb 5.9 oz) 85.3 kg (188 lb 0.8 oz) 82.6 kg (182 lb 1.6 oz)    Exam:   General:  Awake, pleasant. Watching TV and reading newspaper  Cardiovascular: rrr without MGR  Respiratory: CTA without WRR  Abdomen: s, nt, nd  Ext no CCE  Data Reviewed: Basic Metabolic Panel:  Recent Labs Lab 07/29/14 1200 07/29/14 1305 07/30/14 1103 07/31/14 0830  NA  --  144 141 137  K  --  5.6* 4.2 4.0  CL  --  111 104 105  CO2  --  20 29 25   GLUCOSE  --  153* 78 118*  BUN  --  38* 14 23  CREATININE  --  4.87* 2.80* 3.54*  CALCIUM  --  10.1 9.9 10.0  MG  2.8*  --   --   --   PHOS 5.7*  --   --  4.6   Liver Function Tests:  Recent Labs Lab 07/31/14 0830  ALBUMIN 2.5*   No results for input(s): LIPASE, AMYLASE in the last 168 hours. No results for input(s): AMMONIA in the last 168 hours. CBC:  Recent Labs Lab 07/29/14 1305 07/30/14 1103 07/31/14 0830  WBC 7.1 5.6 7.7  HGB 6.7* 8.1* 7.5*  HCT 23.0* 26.8* 24.7*  MCV 107.5* 101.9* 103.3*  PLT 477* 408* 330   Cardiac Enzymes:  Recent Labs Lab 07/30/14 0920 07/30/14 1403 07/30/14 2032  TROPONINI <0.03 <0.03 <0.03   BNP (last 3 results) No results for input(s): BNP in the last 8760 hours.  ProBNP (last 3 results) No results for input(s): PROBNP in the last 8760 hours.  CBG: No results for input(s): GLUCAP in the last 168 hours.  Recent Results (from the past 240 hour(s))  MRSA PCR Screening     Status: None   Collection Time: 07/30/14  5:40 AM  Result Value Ref Range Status   MRSA by PCR NEGATIVE NEGATIVE Final    Comment:        The GeneXpert MRSA Assay (FDA approved for NASAL specimens only), is one component of a comprehensive MRSA colonization surveillance program. It is not intended  to diagnose MRSA infection nor to guide or monitor treatment for MRSA infections.      Studies: No results found.  Scheduled Meds: . sodium chloride   Intravenous Once  . sodium chloride   Intravenous Once  . darbepoetin (ARANESP) injection - DIALYSIS  100 mcg Intravenous Q Thu-HD  . divalproex  750 mg Oral Q breakfast  . feeding supplement (NEPRO CARB STEADY)  237 mL Oral Q1500  . ferric gluconate (FERRLECIT/NULECIT) IV  250 mg Intravenous Q T,Th,S,Su  . heparin  5,000 Units Subcutaneous 3 times per day  . ipratropium-albuterol  3 mL Nebulization QID  . mometasone-formoterol  2 puff Inhalation BID  . multivitamin  1 tablet Oral QHS  . nicotine  21 mg Transdermal Daily  . pantoprazole  40 mg Oral Daily  . simvastatin  10 mg Oral QHS  . sodium chloride  3 mL  Intravenous Q12H  . sucralfate  1 g Oral TID WC & HS  . ziprasidone  60 mg Oral BID WC   Continuous Infusions:  Antibiotics Given (last 72 hours)    None      Time spent: 25 min  [facility] [email or web address], password [facility] 07/31/2014, 2:33 PM  LOS: 2 days

## 2014-07-31 NOTE — Progress Notes (Signed)
Two IV RN attempted to insert IV using [place] but unable to place it. Dr. Conley Canal made aware,order received to d/c Ct angio order and order VQ scan. Anjolie Majer, Wonda Cheng, Therapist, sports

## 2014-07-31 NOTE — Progress Notes (Signed)
Subjective:  Alert, no issues  Objective Vital signs in last 24 hours: Filed Vitals:   07/31/14 0930 07/31/14 1000 07/31/14 1030 07/31/14 1100  BP: 121/80 105/70 116/69 106/73  Pulse: 102 108 95 98  Temp:      TempSrc:      Resp:      Height:      Weight:      SpO2:       Weight change: -2.9 kg (-6 lb 6.3 oz)  Physical Exam: General: Alert , talkative and In NAD  Heart: RRR, no rub, mur , gallop Lungs: decr bs at BAses and wheezing exp decr from admit Abdomen: bs pos soft, nt, nd  Extremities: no pedal  edema Dialysis Access:  R UA AVF  Pos bruit   CXR  No acute disease   OP Dialysis Orders: Center: [facility] on tts . EDW 82.5kg HD Bath 2.0k, 2.0 ca Time 4hrs Heparin 6000. Access R U A AVF BFR 400 DFR 800  Hectorol 1 mcg IV/HD ARANESP 120 mcg inv q weekly recently 80 Given 07/24/14 Units IV/HD Venofer 100mg  x 10 to finish 07/29/14  Other op lambs ca 9.0 , phos 5.8 pth 224 hgb see hpi   Assessment: 1. Anemia - s/p 2u prbc, Hb 7.5 today, stool guiac pending. Will give another unit prbc w HD today 2. ESRD - HD TTS 3. Hypertension/volume - bp 127/82 UF to dry wt today  4. Metabolic bone disease - phos 5.7 on admit  Ca = 10.1. Binders and vit d 5. Bipolar Disease/ ho Depression - meds 6. Noncompliance with HD attend ence - dw pt "Ill do better" 7. COPD/Tob abuse "for 42 years" 8. OSA - CPAP at home  Plan - HD , UF 3kg, give one unit blood   Kelly Splinter MD (pgr) [phone]    ([phone] 07/31/2014, 11:18 AM         Labs: Basic Metabolic Panel:  Recent Labs Lab 07/29/14 1200 07/29/14 1305 07/30/14 1103 07/31/14 0830  NA  --  144 141 137  K  --  5.6* 4.2 4.0  CL  --  111 104 105  CO2  --  20 29 25   GLUCOSE  --  153* 78 118*  BUN  --  38* 14 23  CREATININE  --  4.87* 2.80* 3.54*  CALCIUM  --  10.1 9.9 10.0  PHOS 5.7*  --   --  4.6    Recent Labs Lab 07/29/14 1305 07/30/14 1103 07/31/14 0830  WBC 7.1 5.6 7.7  HGB 6.7*  8.1* 7.5*  HCT 23.0* 26.8* 24.7*  MCV 107.5* 101.9* 103.3*  PLT 477* 408* 330     Studies/Results: Dg Chest 2 View  07/29/2014   CLINICAL DATA:  54 year old female with generalized chest pain. Has missed dialysis recently. Initial encounter.  EXAM: CHEST  2 VIEW  COMPARISON:  05/16/2014 and earlier.  FINDINGS: Similar low lung volumes. Stable cardiomegaly and mediastinal contours. No pneumothorax. No pleural effusion or pulmonary edema. No consolidation. Mild linear scarring or atelectasis in the left mid lung. Stable right axillary surgical clips. No acute osseous abnormality identified.  IMPRESSION: Stable cardiomegaly. No acute cardiopulmonary abnormality.   Electronically Signed   By: Genevie Ann M.D.   On: 07/29/2014 13:09   Medications:   . sodium chloride   Intravenous Once  . Darbepoetin Alfa      . darbepoetin (ARANESP) injection - DIALYSIS  100 mcg Intravenous Q Thu-HD  . divalproex  750  mg Oral Q breakfast  . feeding supplement (NEPRO CARB STEADY)  237 mL Oral Q1500  . ferric gluconate (FERRLECIT/NULECIT) IV  250 mg Intravenous Q T,Th,S,Su  . heparin  5,000 Units Subcutaneous 3 times per day  . ipratropium-albuterol  3 mL Nebulization QID  . mometasone-formoterol  2 puff Inhalation BID  . multivitamin  1 tablet Oral QHS  . nicotine  21 mg Transdermal Daily  . pantoprazole  40 mg Oral Daily  . simvastatin  10 mg Oral QHS  . sodium chloride  3 mL Intravenous Q12H  . sucralfate  1 g Oral TID WC & HS  . ziprasidone  60 mg Oral BID WC

## 2014-07-31 NOTE — Progress Notes (Signed)
Ambulated patient in the hallway with the use of walker,patient tolerated it well. No SOB noted. Lorinda Copland, Wonda Cheng, Therapist, sports

## 2014-08-01 ENCOUNTER — Inpatient Hospital Stay (HOSPITAL_COMMUNITY): Payer: Medicare Other

## 2014-08-01 DIAGNOSIS — R079 Chest pain, unspecified: Secondary | ICD-10-CM

## 2014-08-01 LAB — HEMOGLOBIN AND HEMATOCRIT, BLOOD
HCT: 37.3 % (ref 36.0–46.0)
HCT: 30.4 % — ABNORMAL LOW (ref 36.0–46.0)
Hemoglobin: 12 g/dL (ref 12.0–15.0)
Hemoglobin: 9.3 g/dL — ABNORMAL LOW (ref 12.0–15.0)

## 2014-08-01 LAB — TYPE AND SCREEN
ABO/RH(D): A POS
Antibody Screen: NEGATIVE
Unit division: 0
Unit division: 0

## 2014-08-01 MED ORDER — TECHNETIUM TC 99M DIETHYLENETRIAME-PENTAACETIC ACID: 40.0000 | Freq: Once | INTRAVENOUS | Status: DC | PRN

## 2014-08-01 MED ORDER — ZIPRASIDONE HCL 60 MG PO CAPS: 60.0000 mg | ORAL_CAPSULE | Freq: Two times a day (BID) | ORAL | Status: DC

## 2014-08-01 MED ORDER — IPRATROPIUM-ALBUTEROL 0.5-2.5 (3) MG/3ML IN SOLN
3.0000 mL | Freq: Three times a day (TID) | RESPIRATORY_TRACT | Status: DC
  Administered 2014-08-01: 3 mL via RESPIRATORY_TRACT
  Filled 2014-08-01: qty 3

## 2014-08-01 MED ORDER — PANTOPRAZOLE SODIUM 40 MG PO TBEC
40.0000 mg | DELAYED_RELEASE_TABLET | Freq: Every day | ORAL | Status: DC
Start: 2014-08-01 — End: 2015-06-08

## 2014-08-01 MED ORDER — DIVALPROEX SODIUM 250 MG PO DR TAB: 750.0000 mg | DELAYED_RELEASE_TABLET | Freq: Every day | ORAL | Status: DC

## 2014-08-01 MED ORDER — TECHNETIUM TO 99M ALBUMIN AGGREGATED
6.0000 | Freq: Once | INTRAVENOUS | Status: AC | PRN
  Administered 2014-08-01: 6 via INTRAVENOUS

## 2014-08-01 NOTE — Progress Notes (Deleted)
Physician Discharge Summary  Jennifer Lawson:[id number] DOB: 07-03-1960 DOA: 07/29/2014  PCP: [facility]  Admit date: 07/29/2014 Discharge date: 08/01/2014  Time spent: greater than 30 minutes  Recommendations for Outpatient Follow-up:  1. Monitor h/h  Discharge Diagnoses:  Primary problem Dyspnea  Active Problems:   HTN (hypertension)   Tobacco use disorder   Hyperkalemia   Acute respiratory failure   COPD exacerbation   CKD (chronic kidney disease) stage V requiring chronic dialysis   Symptomatic anemia   Absolute anemia   Pleuritic chest pain   Discharge Condition: stable  Diet recommendation: renal  Filed Weights   07/31/14 0848 07/31/14 1306 07/31/14 2034  Weight: 85.3 kg (188 lb 0.8 oz) 82.6 kg (182 lb 1.6 oz) 85.412 kg (188 lb 4.8 oz)    History of present illness:  54 year old female patient with known history of bipolar disorder with psychotic features, chronic kidney disease on dialysis with nonadherence to dialysis schedule, hypertension, previous salicylate overdose, COPD with ongoing tobacco abuse, and seizure disorder. She was sent to the emergency department after outpatient labs revealed low hemoglobin. She endorsed to the emergency room physician that she had missed her last 2 dialysis sessions. She reported mild shortness of breath to the emergency room physician but denied orthopnea but did report weakness. Her family reported to the EDP she been sleeping more over the past several days. Patient believes that dialysis take too long and she is frustrated by the dialysis process therefore does not go.  In the ER patient was afebrile mildly tachycardic . She is not hypoxic. BUN was 38 creatinine 4.7 and potassium was 5.6. Hemoglobin was 6.7 and had been reported at around 5 on the outpatient labs. ER physician has ordered 1 unit of blood. Chest x-ray revealed stable cardiomegaly without any acute cardiopulmonary abnormality.   Hospital Course:   Dyspnea:  Secondary to anemia and COPD. Transfused 2 unit prbc. Given bronchodilators.  Also had chest pain. VQ low probability   Symptomatic anemia heomoccults ordered, but not collected. No evidence of overt GI bleed. Anemia profile consistent with chronic disease. Continue aranesp   Hyperkalemia/CKD stage V requiring chronic dialysis Nephrology consulted and patient dialized   Tobacco use disorder counselled against  Alcohol use -drinks "2 big boys" per day/beer  Chest pain -EKG done nonischemic -troponin's negative vq negative   Consultants:  neprhology  Procedures:  none  Discharge Exam: Filed Vitals:   08/01/14 0515  BP: 122/81  Pulse: 85  Temp: 98 F (36.7 C)  Resp: 17    General: talkative. Appears comfortable. Cardiovascular: RRR Respiratory: CTA  Discharge Instructions   Discharge Instructions    Activity as tolerated - No restrictions    Complete by:  As directed           Current Discharge Medication List    START taking these medications   Details  pantoprazole (PROTONIX) 40 MG tablet Take 1 tablet (40 mg total) by mouth daily. Qty: 30 tablet, Refills: 0      CONTINUE these medications which have NOT CHANGED   Details  acetaminophen (TYLENOL) 325 MG tablet Take 2 tablets (650 mg total) by mouth every 4 (four) hours as needed for fever, headache or mild pain.    albuterol (PROVENTIL) (2.5 MG/3ML) 0.083% nebulizer solution Take 3 mLs (2.5 mg total) by nebulization every 4 (four) hours as needed for wheezing or shortness of breath. Qty: 75 mL, Refills: 0    bisacodyl (DULCOLAX) 5 MG EC  tablet Take 1 tablet (5 mg total) by mouth daily as needed for moderate constipation. Qty: 30 tablet, Refills: 0    Darbepoetin Alfa (ARANESP) 60 MCG/0.3ML SOSY injection Inject 0.3 mLs (60 mcg total) into the vein every Thursday with hemodialysis. Qty: 4.2 mL    divalproex (DEPAKOTE) 250 MG DR tablet Take 750 mg by mouth daily.    ferric  gluconate 250 mg in sodium chloride 0.9 % 100 mL Inject 250 mg into the vein every Tuesday, Thursday, Saturday, and Sunday.    Fluticasone-Salmeterol (ADVAIR) 100-50 MCG/DOSE AEPB Inhale 1 puff into the lungs 2 (two) times daily. For asthma Qty: 60 each, Refills: 0    metoprolol tartrate (LOPRESSOR) 25 MG tablet Take 0.5 tablets (12.5 mg total) by mouth 2 (two) times daily. For HTN Qty: 60 tablet, Refills: 0    multivitamin (RENA-VIT) TABS tablet Take 1 tablet by mouth at bedtime. Refills: 0    nicotine (NICODERM CQ - DOSED IN MG/24 HOURS) 21 mg/24hr patch Place 1 patch (21 mg total) onto the skin daily. Qty: 28 patch, Refills: 0    Nutritional Supplements (FEEDING SUPPLEMENT, NEPRO CARB STEADY,) LIQD Take 237 mLs by mouth as needed (missed meal during dialysis.). Refills: 0    PROAIR HFA 108 (90 BASE) MCG/ACT inhaler Inhale 1 puff into the lungs every 4 (four) hours as needed. wheezing    simvastatin (ZOCOR) 10 MG tablet Take 1 tablet (10 mg total) by mouth at bedtime. For high cholesterol/fats Qty: 30 tablet, Refills: 0    ziprasidone (GEODON) 60 MG capsule Take 1 capsule (60 mg total) by mouth 2 (two) times daily with a meal. For mood control Qty: 60 capsule, Refills: 0    docusate sodium 100 MG CAPS Take 100 mg by mouth 2 (two) times daily. Qty: 10 capsule, Refills: 0       Allergies  Allergen Reactions  . Codeine Sulfate Anaphylaxis    Daughter called about having this allergy   . Haldol [Haloperidol Lactate] Shortness Of Breath  . Risperidone And Related Shortness Of Breath  . Gabapentin Other (See Comments)    seizures  . Invega [Paliperidone Er] Nausea And Vomiting  . Trazodone And Nefazodone Other (See Comments)    Makes pt loose balance and fall  . Vistaril [Hydroxyzine Hcl] Nausea And Vomiting   Follow-up Information    Follow up with [facility] In 1 week.   Specialty:  Internal Medicine   Contact information:   [address] [place]  [address] [phone]        The results of significant diagnostics from this hospitalization (including imaging, microbiology, ancillary and laboratory) are listed below for reference.    Significant Diagnostic Studies: Dg Chest 2 View  07/29/2014   CLINICAL DATA:  54 year old female with generalized chest pain. Has missed dialysis recently. Initial encounter.  EXAM: CHEST  2 VIEW  COMPARISON:  05/16/2014 and earlier.  FINDINGS: Similar low lung volumes. Stable cardiomegaly and mediastinal contours. No pneumothorax. No pleural effusion or pulmonary edema. No consolidation. Mild linear scarring or atelectasis in the left mid lung. Stable right axillary surgical clips. No acute osseous abnormality identified.  IMPRESSION: Stable cardiomegaly. No acute cardiopulmonary abnormality.   Electronically Signed   By: Genevie Ann M.D.   On: 07/29/2014 13:09   Nm Pulmonary Perf And Vent  08/01/2014   CLINICAL DATA:  Shortness of Breath  EXAM: NUCLEAR MEDICINE VENTILATION - PERFUSION LUNG SCAN  Views: Anterior, posterior, left lateral, right lateral, RPO, LPO,  RAO, LAO -ventilation and perfusion  Radionuclide: Technetium 46m DTPA-ventilation; Technetium 34m macroaggregated albumin-perfusion  Dose:  40.0 mCi-ventilation; 6.0 mCi-perfusion  Route of administration: Inhalation -ventilation ; intravenous -perfusion  COMPARISON:  Chest radiograph July 28, 2004  FINDINGS: Ventilation: Radiotracer uptake bilaterally is essentially homogeneous and symmetric bilaterally. Heart is noted to be enlarged. There are no wedge-shaped ventilation defects.  Perfusion: Cardiomegaly is present. There is essentially homogeneous and symmetric uptake of radiotracer bilaterally on the perfusion study. No segmental or significance subsegmental perfusion defects are identified. There is no appreciable ventilation/ perfusion mismatch.  IMPRESSION: No appreciable ventilation/perfusion defects or mismatch. This study constitutes a very low  probability of pulmonary embolus. Heart is enlarged.   Electronically Signed   By: Lowella Grip III M.D.   On: 08/01/2014 15:52    Microbiology: Recent Results (from the past 240 hour(s))  MRSA PCR Screening     Status: None   Collection Time: 07/30/14  5:40 AM  Result Value Ref Range Status   MRSA by PCR NEGATIVE NEGATIVE Final    Comment:        The GeneXpert MRSA Assay (FDA approved for NASAL specimens only), is one component of a comprehensive MRSA colonization surveillance program. It is not intended to diagnose MRSA infection nor to guide or monitor treatment for MRSA infections.      Labs: Basic Metabolic Panel:  Recent Labs Lab 07/29/14 1200 07/29/14 1305 07/30/14 1103 07/31/14 0830  NA  --  144 141 137  K  --  5.6* 4.2 4.0  CL  --  111 104 105  CO2  --  20 29 25   GLUCOSE  --  153* 78 118*  BUN  --  38* 14 23  CREATININE  --  4.87* 2.80* 3.54*  CALCIUM  --  10.1 9.9 10.0  MG 2.8*  --   --   --   PHOS 5.7*  --   --  4.6   Liver Function Tests:  Recent Labs Lab 07/31/14 0830  ALBUMIN 2.5*   No results for input(s): LIPASE, AMYLASE in the last 168 hours. No results for input(s): AMMONIA in the last 168 hours. CBC:  Recent Labs Lab 07/29/14 1305 07/30/14 1103 07/31/14 0830 08/01/14 0749 08/01/14 1020  WBC 7.1 5.6 7.7  --   --   HGB 6.7* 8.1* 7.5* 12.0 9.3*  HCT 23.0* 26.8* 24.7* 37.3 30.4*  MCV 107.5* 101.9* 103.3*  --   --   PLT 477* 408* 330  --   --    Cardiac Enzymes:  Recent Labs Lab 07/30/14 0920 07/30/14 1403 07/30/14 2032  TROPONINI <0.03 <0.03 <0.03   BNP: BNP (last 3 results) No results for input(s): BNP in the last 8760 hours.  ProBNP (last 3 results) No results for input(s): PROBNP in the last 8760 hours.  CBG: No results for input(s): GLUCAP in the last 168 hours.     SignedDelfina Redwood  [facility] 08/01/2014, 4:34 PM

## 2014-08-01 NOTE — Care Management Note (Signed)
CARE MANAGEMENT NOTE 08/01/2014  Patient:  Jennifer Lawson, Jennifer Lawson   Account Number:  [id number]  Date Initiated:  08/01/2014  Documentation initiated by:  Domino Holten  Subjective/Objective Assessment:   CM following for progression and d/c planning.     Action/Plan:   No d/c needs identified.   Anticipated DC Date:  08/01/2014   Anticipated DC Plan:  HOME/SELF CARE         Choice offered to / List presented to:             Status of service:  Completed, signed off Medicare Important Message given?  YES (If response is "NO", the following Medicare IM given date fields will be blank) Date Medicare IM given:  07/31/2014 Medicare IM given by:  Mikaylee Arseneau Date Additional Medicare IM given:   Additional Medicare IM given by:    Discharge Disposition:  HOME/SELF CARE  Per UR Regulation:    If discussed at Long Length of Stay Meetings, dates discussed:    Comments:

## 2014-08-02 DIAGNOSIS — N186 End stage renal disease: Secondary | ICD-10-CM | POA: Diagnosis not present

## 2014-08-02 DIAGNOSIS — D509 Iron deficiency anemia, unspecified: Secondary | ICD-10-CM | POA: Diagnosis not present

## 2014-08-02 DIAGNOSIS — D689 Coagulation defect, unspecified: Secondary | ICD-10-CM | POA: Diagnosis not present

## 2014-08-02 DIAGNOSIS — R52 Pain, unspecified: Secondary | ICD-10-CM | POA: Diagnosis not present

## 2014-08-02 DIAGNOSIS — D631 Anemia in chronic kidney disease: Secondary | ICD-10-CM | POA: Diagnosis not present

## 2014-08-02 DIAGNOSIS — Z23 Encounter for immunization: Secondary | ICD-10-CM | POA: Diagnosis not present

## 2014-08-02 DIAGNOSIS — N2581 Secondary hyperparathyroidism of renal origin: Secondary | ICD-10-CM | POA: Diagnosis not present

## 2014-08-05 DIAGNOSIS — Z23 Encounter for immunization: Secondary | ICD-10-CM | POA: Diagnosis not present

## 2014-08-05 DIAGNOSIS — D631 Anemia in chronic kidney disease: Secondary | ICD-10-CM | POA: Diagnosis not present

## 2014-08-05 DIAGNOSIS — R52 Pain, unspecified: Secondary | ICD-10-CM | POA: Diagnosis not present

## 2014-08-05 DIAGNOSIS — N2581 Secondary hyperparathyroidism of renal origin: Secondary | ICD-10-CM | POA: Diagnosis not present

## 2014-08-05 DIAGNOSIS — D689 Coagulation defect, unspecified: Secondary | ICD-10-CM | POA: Diagnosis not present

## 2014-08-05 DIAGNOSIS — D509 Iron deficiency anemia, unspecified: Secondary | ICD-10-CM | POA: Diagnosis not present

## 2014-08-05 DIAGNOSIS — N186 End stage renal disease: Secondary | ICD-10-CM | POA: Diagnosis not present

## 2014-08-06 ENCOUNTER — Other Ambulatory Visit (HOSPITAL_COMMUNITY): Payer: Self-pay | Admitting: Internal Medicine

## 2014-08-06 DIAGNOSIS — J45909 Unspecified asthma, uncomplicated: Secondary | ICD-10-CM | POA: Diagnosis not present

## 2014-08-06 DIAGNOSIS — Z992 Dependence on renal dialysis: Secondary | ICD-10-CM | POA: Diagnosis not present

## 2014-08-06 DIAGNOSIS — I120 Hypertensive chronic kidney disease with stage 5 chronic kidney disease or end stage renal disease: Secondary | ICD-10-CM | POA: Diagnosis not present

## 2014-08-06 DIAGNOSIS — E119 Type 2 diabetes mellitus without complications: Secondary | ICD-10-CM | POA: Diagnosis not present

## 2014-08-06 DIAGNOSIS — R1319 Other dysphagia: Secondary | ICD-10-CM | POA: Diagnosis not present

## 2014-08-06 DIAGNOSIS — J449 Chronic obstructive pulmonary disease, unspecified: Secondary | ICD-10-CM | POA: Diagnosis not present

## 2014-08-06 DIAGNOSIS — Z9181 History of falling: Secondary | ICD-10-CM | POA: Diagnosis not present

## 2014-08-06 DIAGNOSIS — K219 Gastro-esophageal reflux disease without esophagitis: Secondary | ICD-10-CM | POA: Diagnosis not present

## 2014-08-06 DIAGNOSIS — N186 End stage renal disease: Secondary | ICD-10-CM | POA: Diagnosis not present

## 2014-08-06 DIAGNOSIS — F319 Bipolar disorder, unspecified: Secondary | ICD-10-CM | POA: Diagnosis not present

## 2014-08-06 DIAGNOSIS — F329 Major depressive disorder, single episode, unspecified: Secondary | ICD-10-CM | POA: Diagnosis not present

## 2014-08-06 DIAGNOSIS — J9691 Respiratory failure, unspecified with hypoxia: Secondary | ICD-10-CM | POA: Diagnosis not present

## 2014-08-07 DIAGNOSIS — R52 Pain, unspecified: Secondary | ICD-10-CM | POA: Diagnosis not present

## 2014-08-07 DIAGNOSIS — N186 End stage renal disease: Secondary | ICD-10-CM | POA: Diagnosis not present

## 2014-08-07 DIAGNOSIS — D509 Iron deficiency anemia, unspecified: Secondary | ICD-10-CM | POA: Diagnosis not present

## 2014-08-07 DIAGNOSIS — Z992 Dependence on renal dialysis: Secondary | ICD-10-CM | POA: Diagnosis not present

## 2014-08-07 DIAGNOSIS — D631 Anemia in chronic kidney disease: Secondary | ICD-10-CM | POA: Diagnosis not present

## 2014-08-07 DIAGNOSIS — D689 Coagulation defect, unspecified: Secondary | ICD-10-CM | POA: Diagnosis not present

## 2014-08-07 DIAGNOSIS — N2581 Secondary hyperparathyroidism of renal origin: Secondary | ICD-10-CM | POA: Diagnosis not present

## 2014-08-07 DIAGNOSIS — Z23 Encounter for immunization: Secondary | ICD-10-CM | POA: Diagnosis not present

## 2014-08-09 DIAGNOSIS — N2581 Secondary hyperparathyroidism of renal origin: Secondary | ICD-10-CM | POA: Diagnosis not present

## 2014-08-09 DIAGNOSIS — R52 Pain, unspecified: Secondary | ICD-10-CM | POA: Diagnosis not present

## 2014-08-09 DIAGNOSIS — D689 Coagulation defect, unspecified: Secondary | ICD-10-CM | POA: Diagnosis not present

## 2014-08-09 DIAGNOSIS — N186 End stage renal disease: Secondary | ICD-10-CM | POA: Diagnosis not present

## 2014-08-09 DIAGNOSIS — D631 Anemia in chronic kidney disease: Secondary | ICD-10-CM | POA: Diagnosis not present

## 2014-08-09 DIAGNOSIS — D509 Iron deficiency anemia, unspecified: Secondary | ICD-10-CM | POA: Diagnosis not present

## 2014-08-11 NOTE — Discharge Summary (Signed)
Delfina Redwood, MD Physician Signed Internal Medicine Progress Notes 08/01/2014 4:31 PM    Expand All Collapse All   Physician Discharge Summary  Jennifer Lawson ALP:[id number] DOB: [birth year] DOA: 07/29/2014  PCP: [facility]  Admit date: 07/29/2014 Discharge date: 08/01/2014  Time spent: greater than 30 minutes  Recommendations for Outpatient Follow-up:  1. Monitor h/h  Discharge Diagnoses:  Primary problem Dyspnea  Active Problems:  HTN (hypertension)  Tobacco use disorder  Hyperkalemia  Acute respiratory failure  COPD exacerbation  CKD (chronic kidney disease) stage V requiring chronic dialysis  Symptomatic anemia  Absolute anemia  Pleuritic chest pain   Discharge Condition: stable  Diet recommendation: renal  Filed Weights   07/31/14 0848 07/31/14 1306 07/31/14 2034  Weight: 85.3 kg (188 lb 0.8 oz) 82.6 kg (182 lb 1.6 oz) 85.412 kg (188 lb 4.8 oz)    History of present illness:  54 year old female patient with known history of bipolar disorder with psychotic features, chronic kidney disease on dialysis with nonadherence to dialysis schedule, hypertension, previous salicylate overdose, COPD with ongoing tobacco abuse, and seizure disorder. She was sent to the emergency department after outpatient labs revealed low hemoglobin. She endorsed to the emergency room physician that she had missed her last 2 dialysis sessions. She reported mild shortness of breath to the emergency room physician but denied orthopnea but did report weakness. Her family reported to the EDP she been sleeping more over the past several days. Patient believes that dialysis take too long and she is frustrated by the dialysis process therefore does not go.  In the ER patient was afebrile mildly tachycardic . She is not hypoxic. BUN was 38 creatinine 4.7 and potassium was 5.6. Hemoglobin was 6.7 and had been reported at around 5 on the outpatient labs. ER physician has  ordered 1 unit of blood. Chest x-ray revealed stable cardiomegaly without any acute cardiopulmonary abnormality.   Hospital Course:  Dyspnea:  Secondary to anemia and COPD. Transfused 2 unit prbc. Given bronchodilators. Also had chest pain. VQ low probability   Symptomatic anemia heomoccults ordered, but not collected. No evidence of overt GI bleed. Anemia profile consistent with chronic disease. Continue aranesp   Hyperkalemia/CKD stage V requiring chronic dialysis Nephrology consulted and patient dialized   Tobacco use disorder counselled against  Alcohol use -drinks "2 big boys" per day/beer  Chest pain -EKG done nonischemic -troponin's negative vq negative   Consultants:  neprhology  Procedures:  none  Discharge Exam: Filed Vitals:   08/01/14 0515  BP: 122/81  Pulse: 85  Temp: 98 F (36.7 C)  Resp: 17    General: talkative. Appears comfortable. Cardiovascular: RRR Respiratory: CTA  Discharge Instructions   Discharge Instructions    Activity as tolerated - No restrictions  Complete by: As directed           Current Discharge Medication List    START taking these medications   Details  pantoprazole (PROTONIX) 40 MG tablet Take 1 tablet (40 mg total) by mouth daily. Qty: 30 tablet, Refills: 0      CONTINUE these medications which have NOT CHANGED   Details  acetaminophen (TYLENOL) 325 MG tablet Take 2 tablets (650 mg total) by mouth every 4 (four) hours as needed for fever, headache or mild pain.    albuterol (PROVENTIL) (2.5 MG/3ML) 0.083% nebulizer solution Take 3 mLs (2.5 mg total) by nebulization every 4 (four) hours as needed for wheezing or shortness of breath. Qty: 75 mL, Refills:  0    bisacodyl (DULCOLAX) 5 MG EC tablet Take 1 tablet (5 mg total) by mouth daily as needed for moderate constipation. Qty: 30 tablet, Refills: 0    Darbepoetin Alfa (ARANESP) 60 MCG/0.3ML SOSY injection  Inject 0.3 mLs (60 mcg total) into the vein every Thursday with hemodialysis. Qty: 4.2 mL    divalproex (DEPAKOTE) 250 MG DR tablet Take 750 mg by mouth daily.    ferric gluconate 250 mg in sodium chloride 0.9 % 100 mL Inject 250 mg into the vein every Tuesday, Thursday, Saturday, and Sunday.    Fluticasone-Salmeterol (ADVAIR) 100-50 MCG/DOSE AEPB Inhale 1 puff into the lungs 2 (two) times daily. For asthma Qty: 60 each, Refills: 0    metoprolol tartrate (LOPRESSOR) 25 MG tablet Take 0.5 tablets (12.5 mg total) by mouth 2 (two) times daily. For HTN Qty: 60 tablet, Refills: 0    multivitamin (RENA-VIT) TABS tablet Take 1 tablet by mouth at bedtime. Refills: 0    nicotine (NICODERM CQ - DOSED IN MG/24 HOURS) 21 mg/24hr patch Place 1 patch (21 mg total) onto the skin daily. Qty: 28 patch, Refills: 0    Nutritional Supplements (FEEDING SUPPLEMENT, NEPRO CARB STEADY,) LIQD Take 237 mLs by mouth as needed (missed meal during dialysis.). Refills: 0    PROAIR HFA 108 (90 BASE) MCG/ACT inhaler Inhale 1 puff into the lungs every 4 (four) hours as needed. wheezing    simvastatin (ZOCOR) 10 MG tablet Take 1 tablet (10 mg total) by mouth at bedtime. For high cholesterol/fats Qty: 30 tablet, Refills: 0    ziprasidone (GEODON) 60 MG capsule Take 1 capsule (60 mg total) by mouth 2 (two) times daily with a meal. For mood control Qty: 60 capsule, Refills: 0    docusate sodium 100 MG CAPS Take 100 mg by mouth 2 (two) times daily. Qty: 10 capsule, Refills: 0       Allergies  Allergen Reactions  . Codeine Sulfate Anaphylaxis    Daughter called about having this allergy   . Haldol [Haloperidol Lactate] Shortness Of Breath  . Risperidone And Related Shortness Of Breath  . Gabapentin Other (See Comments)    seizures  . Invega [Paliperidone Er] Nausea And Vomiting  . Trazodone And Nefazodone Other (See Comments)    Makes pt loose  balance and fall  . Vistaril [Hydroxyzine Hcl] Nausea And Vomiting   Follow-up Information    Follow up with [facility] In 1 week.   Specialty: Internal Medicine   Contact information:   [address] [place] [address] [phone]         The results of significant diagnostics from this hospitalization (including imaging, microbiology, ancillary and laboratory) are listed below for reference.    Significant Diagnostic Studies:  Imaging Results    Dg Chest 2 View  07/29/2014 CLINICAL DATA: 54 year old female with generalized chest pain. Has missed dialysis recently. Initial encounter. EXAM: CHEST 2 VIEW COMPARISON: 05/16/2014 and earlier. FINDINGS: Similar low lung volumes. Stable cardiomegaly and mediastinal contours. No pneumothorax. No pleural effusion or pulmonary edema. No consolidation. Mild linear scarring or atelectasis in the left mid lung. Stable right axillary surgical clips. No acute osseous abnormality identified. IMPRESSION: Stable cardiomegaly. No acute cardiopulmonary abnormality. Electronically Signed By: Genevie Ann M.D. On: 07/29/2014 13:09   Nm Pulmonary Perf And Vent  08/01/2014 CLINICAL DATA: Shortness of Breath EXAM: NUCLEAR MEDICINE VENTILATION - PERFUSION LUNG SCAN Views: Anterior, posterior, left lateral, right lateral, RPO, LPO, RAO, LAO -ventilation and perfusion Radionuclide:  Technetium 12m DTPA-ventilation; Technetium 47m macroaggregated albumin-perfusion Dose: 40.0 mCi-ventilation; 6.0 mCi-perfusion Route of administration: Inhalation -ventilation ; intravenous -perfusion COMPARISON: Chest radiograph July 28, 2004 FINDINGS: Ventilation: Radiotracer uptake bilaterally is essentially homogeneous and symmetric bilaterally. Heart is noted to be enlarged. There are no wedge-shaped ventilation defects. Perfusion: Cardiomegaly is present. There is essentially homogeneous and symmetric uptake of radiotracer  bilaterally on the perfusion study. No segmental or significance subsegmental perfusion defects are identified. There is no appreciable ventilation/ perfusion mismatch. IMPRESSION: No appreciable ventilation/perfusion defects or mismatch. This study constitutes a very low probability of pulmonary embolus. Heart is enlarged. Electronically Signed By: Lowella Grip III M.D. On: 08/01/2014 15:52     Microbiology: Recent Results (from the past 240 hour(s))  MRSA PCR Screening Status: None   Collection Time: 07/30/14 5:40 AM  Result Value Ref Range Status   MRSA by PCR NEGATIVE NEGATIVE Final    Comment:   The GeneXpert MRSA Assay (FDA approved for NASAL specimens only), is one component of a comprehensive MRSA colonization surveillance program. It is not intended to diagnose MRSA infection nor to guide or monitor treatment for MRSA infections.      Labs: Basic Metabolic Panel:  Last Labs      Recent Labs Lab 07/29/14 1200 07/29/14 1305 07/30/14 1103 07/31/14 0830  NA --  144 141 137  K --  5.6* 4.2 4.0  CL --  111 104 105  CO2 --  20 29 25   GLUCOSE --  153* 78 118*  BUN --  38* 14 23  CREATININE --  4.87* 2.80* 3.54*  CALCIUM --  10.1 9.9 10.0  MG 2.8* --  --  --   PHOS 5.7* --  --  4.6     Liver Function Tests:  Last Labs      Recent Labs Lab 07/31/14 0830  ALBUMIN 2.5*      Last Labs     No results for input(s): LIPASE, AMYLASE in the last 168 hours.    Last Labs     No results for input(s): AMMONIA in the last 168 hours.   CBC:  Last Labs      Recent Labs Lab 07/29/14 1305 07/30/14 1103 07/31/14 0830 08/01/14 0749 08/01/14 1020  WBC 7.1 5.6 7.7 --  --   HGB 6.7* 8.1* 7.5* 12.0 9.3*  HCT 23.0* 26.8* 24.7* 37.3 30.4*  MCV 107.5* 101.9* 103.3* --  --   PLT 477* 408* 330 --  --      Cardiac  Enzymes:  Last Labs      Recent Labs Lab 07/30/14 0920 07/30/14 1403 07/30/14 2032  TROPONINI <0.03 <0.03 <0.03     BNP: BNP (last 3 results)  Recent Labs (within last 365 days)    No results for input(s): BNP in the last 8760 hours.    ProBNP (last 3 results)  Recent Labs (within last 365 days)    No results for input(s): PROBNP in the last 8760 hours.    CBG:  Last Labs     No results for input(s): GLUCAP in the last 168 hours.       SignedDelfina Redwood [facility] 08/01/2014, 4:34 PM

## 2014-08-14 DIAGNOSIS — R52 Pain, unspecified: Secondary | ICD-10-CM | POA: Diagnosis not present

## 2014-08-14 DIAGNOSIS — D631 Anemia in chronic kidney disease: Secondary | ICD-10-CM | POA: Diagnosis not present

## 2014-08-14 DIAGNOSIS — D509 Iron deficiency anemia, unspecified: Secondary | ICD-10-CM | POA: Diagnosis not present

## 2014-08-14 DIAGNOSIS — D689 Coagulation defect, unspecified: Secondary | ICD-10-CM | POA: Diagnosis not present

## 2014-08-14 DIAGNOSIS — N2581 Secondary hyperparathyroidism of renal origin: Secondary | ICD-10-CM | POA: Diagnosis not present

## 2014-08-14 DIAGNOSIS — N186 End stage renal disease: Secondary | ICD-10-CM | POA: Diagnosis not present

## 2014-08-16 DIAGNOSIS — D689 Coagulation defect, unspecified: Secondary | ICD-10-CM | POA: Diagnosis not present

## 2014-08-16 DIAGNOSIS — R52 Pain, unspecified: Secondary | ICD-10-CM | POA: Diagnosis not present

## 2014-08-16 DIAGNOSIS — N2581 Secondary hyperparathyroidism of renal origin: Secondary | ICD-10-CM | POA: Diagnosis not present

## 2014-08-16 DIAGNOSIS — N186 End stage renal disease: Secondary | ICD-10-CM | POA: Diagnosis not present

## 2014-08-16 DIAGNOSIS — D631 Anemia in chronic kidney disease: Secondary | ICD-10-CM | POA: Diagnosis not present

## 2014-08-16 DIAGNOSIS — D509 Iron deficiency anemia, unspecified: Secondary | ICD-10-CM | POA: Diagnosis not present

## 2014-08-17 DIAGNOSIS — J449 Chronic obstructive pulmonary disease, unspecified: Secondary | ICD-10-CM | POA: Diagnosis not present

## 2014-08-17 DIAGNOSIS — N186 End stage renal disease: Secondary | ICD-10-CM | POA: Diagnosis not present

## 2014-08-18 DIAGNOSIS — J449 Chronic obstructive pulmonary disease, unspecified: Secondary | ICD-10-CM | POA: Diagnosis not present

## 2014-08-18 DIAGNOSIS — R269 Unspecified abnormalities of gait and mobility: Secondary | ICD-10-CM | POA: Diagnosis not present

## 2014-08-19 DIAGNOSIS — N186 End stage renal disease: Secondary | ICD-10-CM | POA: Diagnosis not present

## 2014-08-19 DIAGNOSIS — F259 Schizoaffective disorder, unspecified: Secondary | ICD-10-CM | POA: Diagnosis not present

## 2014-08-19 DIAGNOSIS — D631 Anemia in chronic kidney disease: Secondary | ICD-10-CM | POA: Diagnosis not present

## 2014-08-19 DIAGNOSIS — D509 Iron deficiency anemia, unspecified: Secondary | ICD-10-CM | POA: Diagnosis not present

## 2014-08-19 DIAGNOSIS — R52 Pain, unspecified: Secondary | ICD-10-CM | POA: Diagnosis not present

## 2014-08-19 DIAGNOSIS — D689 Coagulation defect, unspecified: Secondary | ICD-10-CM | POA: Diagnosis not present

## 2014-08-19 DIAGNOSIS — N2581 Secondary hyperparathyroidism of renal origin: Secondary | ICD-10-CM | POA: Diagnosis not present

## 2014-08-21 DIAGNOSIS — D689 Coagulation defect, unspecified: Secondary | ICD-10-CM | POA: Diagnosis not present

## 2014-08-21 DIAGNOSIS — N186 End stage renal disease: Secondary | ICD-10-CM | POA: Diagnosis not present

## 2014-08-21 DIAGNOSIS — R52 Pain, unspecified: Secondary | ICD-10-CM | POA: Diagnosis not present

## 2014-08-21 DIAGNOSIS — D631 Anemia in chronic kidney disease: Secondary | ICD-10-CM | POA: Diagnosis not present

## 2014-08-21 DIAGNOSIS — N2581 Secondary hyperparathyroidism of renal origin: Secondary | ICD-10-CM | POA: Diagnosis not present

## 2014-08-21 DIAGNOSIS — D509 Iron deficiency anemia, unspecified: Secondary | ICD-10-CM | POA: Diagnosis not present

## 2014-08-23 DIAGNOSIS — N2581 Secondary hyperparathyroidism of renal origin: Secondary | ICD-10-CM | POA: Diagnosis not present

## 2014-08-23 DIAGNOSIS — D689 Coagulation defect, unspecified: Secondary | ICD-10-CM | POA: Diagnosis not present

## 2014-08-23 DIAGNOSIS — D509 Iron deficiency anemia, unspecified: Secondary | ICD-10-CM | POA: Diagnosis not present

## 2014-08-23 DIAGNOSIS — N186 End stage renal disease: Secondary | ICD-10-CM | POA: Diagnosis not present

## 2014-08-23 DIAGNOSIS — R52 Pain, unspecified: Secondary | ICD-10-CM | POA: Diagnosis not present

## 2014-08-23 DIAGNOSIS — D631 Anemia in chronic kidney disease: Secondary | ICD-10-CM | POA: Diagnosis not present

## 2014-08-26 DIAGNOSIS — D689 Coagulation defect, unspecified: Secondary | ICD-10-CM | POA: Diagnosis not present

## 2014-08-26 DIAGNOSIS — N186 End stage renal disease: Secondary | ICD-10-CM | POA: Diagnosis not present

## 2014-08-26 DIAGNOSIS — N2581 Secondary hyperparathyroidism of renal origin: Secondary | ICD-10-CM | POA: Diagnosis not present

## 2014-08-26 DIAGNOSIS — D631 Anemia in chronic kidney disease: Secondary | ICD-10-CM | POA: Diagnosis not present

## 2014-08-26 DIAGNOSIS — R52 Pain, unspecified: Secondary | ICD-10-CM | POA: Diagnosis not present

## 2014-08-26 DIAGNOSIS — D509 Iron deficiency anemia, unspecified: Secondary | ICD-10-CM | POA: Diagnosis not present

## 2014-08-28 DIAGNOSIS — N186 End stage renal disease: Secondary | ICD-10-CM | POA: Diagnosis not present

## 2014-08-28 DIAGNOSIS — D631 Anemia in chronic kidney disease: Secondary | ICD-10-CM | POA: Diagnosis not present

## 2014-08-28 DIAGNOSIS — D509 Iron deficiency anemia, unspecified: Secondary | ICD-10-CM | POA: Diagnosis not present

## 2014-08-28 DIAGNOSIS — R52 Pain, unspecified: Secondary | ICD-10-CM | POA: Diagnosis not present

## 2014-08-28 DIAGNOSIS — D689 Coagulation defect, unspecified: Secondary | ICD-10-CM | POA: Diagnosis not present

## 2014-08-28 DIAGNOSIS — N2581 Secondary hyperparathyroidism of renal origin: Secondary | ICD-10-CM | POA: Diagnosis not present

## 2014-08-30 DIAGNOSIS — N2581 Secondary hyperparathyroidism of renal origin: Secondary | ICD-10-CM | POA: Diagnosis not present

## 2014-08-30 DIAGNOSIS — N186 End stage renal disease: Secondary | ICD-10-CM | POA: Diagnosis not present

## 2014-08-30 DIAGNOSIS — D509 Iron deficiency anemia, unspecified: Secondary | ICD-10-CM | POA: Diagnosis not present

## 2014-08-30 DIAGNOSIS — D631 Anemia in chronic kidney disease: Secondary | ICD-10-CM | POA: Diagnosis not present

## 2014-08-30 DIAGNOSIS — R52 Pain, unspecified: Secondary | ICD-10-CM | POA: Diagnosis not present

## 2014-08-30 DIAGNOSIS — D689 Coagulation defect, unspecified: Secondary | ICD-10-CM | POA: Diagnosis not present

## 2014-09-02 DIAGNOSIS — D689 Coagulation defect, unspecified: Secondary | ICD-10-CM | POA: Diagnosis not present

## 2014-09-02 DIAGNOSIS — D509 Iron deficiency anemia, unspecified: Secondary | ICD-10-CM | POA: Diagnosis not present

## 2014-09-02 DIAGNOSIS — N2581 Secondary hyperparathyroidism of renal origin: Secondary | ICD-10-CM | POA: Diagnosis not present

## 2014-09-02 DIAGNOSIS — N186 End stage renal disease: Secondary | ICD-10-CM | POA: Diagnosis not present

## 2014-09-02 DIAGNOSIS — R52 Pain, unspecified: Secondary | ICD-10-CM | POA: Diagnosis not present

## 2014-09-02 DIAGNOSIS — D631 Anemia in chronic kidney disease: Secondary | ICD-10-CM | POA: Diagnosis not present

## 2014-09-04 DIAGNOSIS — D509 Iron deficiency anemia, unspecified: Secondary | ICD-10-CM | POA: Diagnosis not present

## 2014-09-04 DIAGNOSIS — D631 Anemia in chronic kidney disease: Secondary | ICD-10-CM | POA: Diagnosis not present

## 2014-09-04 DIAGNOSIS — N2581 Secondary hyperparathyroidism of renal origin: Secondary | ICD-10-CM | POA: Diagnosis not present

## 2014-09-04 DIAGNOSIS — N186 End stage renal disease: Secondary | ICD-10-CM | POA: Diagnosis not present

## 2014-09-04 DIAGNOSIS — D689 Coagulation defect, unspecified: Secondary | ICD-10-CM | POA: Diagnosis not present

## 2014-09-04 DIAGNOSIS — R52 Pain, unspecified: Secondary | ICD-10-CM | POA: Diagnosis not present

## 2014-09-06 DIAGNOSIS — R52 Pain, unspecified: Secondary | ICD-10-CM | POA: Diagnosis not present

## 2014-09-06 DIAGNOSIS — D509 Iron deficiency anemia, unspecified: Secondary | ICD-10-CM | POA: Diagnosis not present

## 2014-09-06 DIAGNOSIS — I129 Hypertensive chronic kidney disease with stage 1 through stage 4 chronic kidney disease, or unspecified chronic kidney disease: Secondary | ICD-10-CM | POA: Diagnosis not present

## 2014-09-06 DIAGNOSIS — N2581 Secondary hyperparathyroidism of renal origin: Secondary | ICD-10-CM | POA: Diagnosis not present

## 2014-09-06 DIAGNOSIS — N186 End stage renal disease: Secondary | ICD-10-CM | POA: Diagnosis not present

## 2014-09-06 DIAGNOSIS — Z992 Dependence on renal dialysis: Secondary | ICD-10-CM | POA: Diagnosis not present

## 2014-09-06 DIAGNOSIS — D631 Anemia in chronic kidney disease: Secondary | ICD-10-CM | POA: Diagnosis not present

## 2014-09-06 DIAGNOSIS — D689 Coagulation defect, unspecified: Secondary | ICD-10-CM | POA: Diagnosis not present

## 2014-09-09 DIAGNOSIS — N186 End stage renal disease: Secondary | ICD-10-CM | POA: Diagnosis not present

## 2014-09-09 DIAGNOSIS — N2581 Secondary hyperparathyroidism of renal origin: Secondary | ICD-10-CM | POA: Diagnosis not present

## 2014-09-09 DIAGNOSIS — R52 Pain, unspecified: Secondary | ICD-10-CM | POA: Diagnosis not present

## 2014-09-09 DIAGNOSIS — D689 Coagulation defect, unspecified: Secondary | ICD-10-CM | POA: Diagnosis not present

## 2014-09-09 DIAGNOSIS — Z23 Encounter for immunization: Secondary | ICD-10-CM | POA: Diagnosis not present

## 2014-09-11 DIAGNOSIS — N186 End stage renal disease: Secondary | ICD-10-CM | POA: Diagnosis not present

## 2014-09-11 DIAGNOSIS — N2581 Secondary hyperparathyroidism of renal origin: Secondary | ICD-10-CM | POA: Diagnosis not present

## 2014-09-11 DIAGNOSIS — R52 Pain, unspecified: Secondary | ICD-10-CM | POA: Diagnosis not present

## 2014-09-11 DIAGNOSIS — Z23 Encounter for immunization: Secondary | ICD-10-CM | POA: Diagnosis not present

## 2014-09-11 DIAGNOSIS — D689 Coagulation defect, unspecified: Secondary | ICD-10-CM | POA: Diagnosis not present

## 2014-09-13 DIAGNOSIS — N2581 Secondary hyperparathyroidism of renal origin: Secondary | ICD-10-CM | POA: Diagnosis not present

## 2014-09-13 DIAGNOSIS — N186 End stage renal disease: Secondary | ICD-10-CM | POA: Diagnosis not present

## 2014-09-13 DIAGNOSIS — R52 Pain, unspecified: Secondary | ICD-10-CM | POA: Diagnosis not present

## 2014-09-13 DIAGNOSIS — Z23 Encounter for immunization: Secondary | ICD-10-CM | POA: Diagnosis not present

## 2014-09-13 DIAGNOSIS — D689 Coagulation defect, unspecified: Secondary | ICD-10-CM | POA: Diagnosis not present

## 2014-09-16 DIAGNOSIS — N2581 Secondary hyperparathyroidism of renal origin: Secondary | ICD-10-CM | POA: Diagnosis not present

## 2014-09-16 DIAGNOSIS — R52 Pain, unspecified: Secondary | ICD-10-CM | POA: Diagnosis not present

## 2014-09-16 DIAGNOSIS — J449 Chronic obstructive pulmonary disease, unspecified: Secondary | ICD-10-CM | POA: Diagnosis not present

## 2014-09-16 DIAGNOSIS — Z23 Encounter for immunization: Secondary | ICD-10-CM | POA: Diagnosis not present

## 2014-09-16 DIAGNOSIS — N186 End stage renal disease: Secondary | ICD-10-CM | POA: Diagnosis not present

## 2014-09-16 DIAGNOSIS — D689 Coagulation defect, unspecified: Secondary | ICD-10-CM | POA: Diagnosis not present

## 2014-09-17 DIAGNOSIS — J449 Chronic obstructive pulmonary disease, unspecified: Secondary | ICD-10-CM | POA: Diagnosis not present

## 2014-09-17 DIAGNOSIS — R269 Unspecified abnormalities of gait and mobility: Secondary | ICD-10-CM | POA: Diagnosis not present

## 2014-09-18 DIAGNOSIS — D689 Coagulation defect, unspecified: Secondary | ICD-10-CM | POA: Diagnosis not present

## 2014-09-18 DIAGNOSIS — Z23 Encounter for immunization: Secondary | ICD-10-CM | POA: Diagnosis not present

## 2014-09-18 DIAGNOSIS — N2581 Secondary hyperparathyroidism of renal origin: Secondary | ICD-10-CM | POA: Diagnosis not present

## 2014-09-18 DIAGNOSIS — R52 Pain, unspecified: Secondary | ICD-10-CM | POA: Diagnosis not present

## 2014-09-18 DIAGNOSIS — N186 End stage renal disease: Secondary | ICD-10-CM | POA: Diagnosis not present

## 2014-09-19 DIAGNOSIS — N186 End stage renal disease: Secondary | ICD-10-CM | POA: Diagnosis not present

## 2014-09-19 DIAGNOSIS — R52 Pain, unspecified: Secondary | ICD-10-CM | POA: Diagnosis not present

## 2014-09-19 DIAGNOSIS — Z23 Encounter for immunization: Secondary | ICD-10-CM | POA: Diagnosis not present

## 2014-09-19 DIAGNOSIS — N2581 Secondary hyperparathyroidism of renal origin: Secondary | ICD-10-CM | POA: Diagnosis not present

## 2014-09-19 DIAGNOSIS — D689 Coagulation defect, unspecified: Secondary | ICD-10-CM | POA: Diagnosis not present

## 2014-09-23 DIAGNOSIS — F259 Schizoaffective disorder, unspecified: Secondary | ICD-10-CM | POA: Diagnosis not present

## 2014-09-23 DIAGNOSIS — Z79899 Other long term (current) drug therapy: Secondary | ICD-10-CM | POA: Diagnosis not present

## 2014-09-25 DIAGNOSIS — N2581 Secondary hyperparathyroidism of renal origin: Secondary | ICD-10-CM | POA: Diagnosis not present

## 2014-09-25 DIAGNOSIS — D689 Coagulation defect, unspecified: Secondary | ICD-10-CM | POA: Diagnosis not present

## 2014-09-25 DIAGNOSIS — R52 Pain, unspecified: Secondary | ICD-10-CM | POA: Diagnosis not present

## 2014-09-25 DIAGNOSIS — Z23 Encounter for immunization: Secondary | ICD-10-CM | POA: Diagnosis not present

## 2014-09-25 DIAGNOSIS — N186 End stage renal disease: Secondary | ICD-10-CM | POA: Diagnosis not present

## 2014-09-27 DIAGNOSIS — N186 End stage renal disease: Secondary | ICD-10-CM | POA: Diagnosis not present

## 2014-09-27 DIAGNOSIS — N2581 Secondary hyperparathyroidism of renal origin: Secondary | ICD-10-CM | POA: Diagnosis not present

## 2014-09-27 DIAGNOSIS — R52 Pain, unspecified: Secondary | ICD-10-CM | POA: Diagnosis not present

## 2014-09-27 DIAGNOSIS — D689 Coagulation defect, unspecified: Secondary | ICD-10-CM | POA: Diagnosis not present

## 2014-09-27 DIAGNOSIS — Z23 Encounter for immunization: Secondary | ICD-10-CM | POA: Diagnosis not present

## 2014-09-30 DIAGNOSIS — D689 Coagulation defect, unspecified: Secondary | ICD-10-CM | POA: Diagnosis not present

## 2014-09-30 DIAGNOSIS — N186 End stage renal disease: Secondary | ICD-10-CM | POA: Diagnosis not present

## 2014-09-30 DIAGNOSIS — N2581 Secondary hyperparathyroidism of renal origin: Secondary | ICD-10-CM | POA: Diagnosis not present

## 2014-09-30 DIAGNOSIS — Z23 Encounter for immunization: Secondary | ICD-10-CM | POA: Diagnosis not present

## 2014-09-30 DIAGNOSIS — R52 Pain, unspecified: Secondary | ICD-10-CM | POA: Diagnosis not present

## 2014-10-02 DIAGNOSIS — N2581 Secondary hyperparathyroidism of renal origin: Secondary | ICD-10-CM | POA: Diagnosis not present

## 2014-10-02 DIAGNOSIS — R52 Pain, unspecified: Secondary | ICD-10-CM | POA: Diagnosis not present

## 2014-10-02 DIAGNOSIS — D689 Coagulation defect, unspecified: Secondary | ICD-10-CM | POA: Diagnosis not present

## 2014-10-02 DIAGNOSIS — Z23 Encounter for immunization: Secondary | ICD-10-CM | POA: Diagnosis not present

## 2014-10-02 DIAGNOSIS — N186 End stage renal disease: Secondary | ICD-10-CM | POA: Diagnosis not present

## 2014-10-04 DIAGNOSIS — N186 End stage renal disease: Secondary | ICD-10-CM | POA: Diagnosis not present

## 2014-10-04 DIAGNOSIS — N2581 Secondary hyperparathyroidism of renal origin: Secondary | ICD-10-CM | POA: Diagnosis not present

## 2014-10-04 DIAGNOSIS — Z23 Encounter for immunization: Secondary | ICD-10-CM | POA: Diagnosis not present

## 2014-10-04 DIAGNOSIS — D689 Coagulation defect, unspecified: Secondary | ICD-10-CM | POA: Diagnosis not present

## 2014-10-04 DIAGNOSIS — R52 Pain, unspecified: Secondary | ICD-10-CM | POA: Diagnosis not present

## 2014-10-07 DIAGNOSIS — N2581 Secondary hyperparathyroidism of renal origin: Secondary | ICD-10-CM | POA: Diagnosis not present

## 2014-10-07 DIAGNOSIS — R52 Pain, unspecified: Secondary | ICD-10-CM | POA: Diagnosis not present

## 2014-10-07 DIAGNOSIS — N186 End stage renal disease: Secondary | ICD-10-CM | POA: Diagnosis not present

## 2014-10-07 DIAGNOSIS — I129 Hypertensive chronic kidney disease with stage 1 through stage 4 chronic kidney disease, or unspecified chronic kidney disease: Secondary | ICD-10-CM | POA: Diagnosis not present

## 2014-10-07 DIAGNOSIS — Z992 Dependence on renal dialysis: Secondary | ICD-10-CM | POA: Diagnosis not present

## 2014-10-07 DIAGNOSIS — D689 Coagulation defect, unspecified: Secondary | ICD-10-CM | POA: Diagnosis not present

## 2014-10-07 DIAGNOSIS — Z23 Encounter for immunization: Secondary | ICD-10-CM | POA: Diagnosis not present

## 2014-10-08 DIAGNOSIS — D631 Anemia in chronic kidney disease: Secondary | ICD-10-CM | POA: Diagnosis not present

## 2014-10-08 DIAGNOSIS — N186 End stage renal disease: Secondary | ICD-10-CM | POA: Diagnosis not present

## 2014-10-08 DIAGNOSIS — N2581 Secondary hyperparathyroidism of renal origin: Secondary | ICD-10-CM | POA: Diagnosis not present

## 2014-10-08 DIAGNOSIS — D689 Coagulation defect, unspecified: Secondary | ICD-10-CM | POA: Diagnosis not present

## 2014-10-08 DIAGNOSIS — R52 Pain, unspecified: Secondary | ICD-10-CM | POA: Diagnosis not present

## 2014-10-08 DIAGNOSIS — E8779 Other fluid overload: Secondary | ICD-10-CM | POA: Diagnosis not present

## 2014-10-09 DIAGNOSIS — E8779 Other fluid overload: Secondary | ICD-10-CM | POA: Diagnosis not present

## 2014-10-09 DIAGNOSIS — N2581 Secondary hyperparathyroidism of renal origin: Secondary | ICD-10-CM | POA: Diagnosis not present

## 2014-10-09 DIAGNOSIS — R52 Pain, unspecified: Secondary | ICD-10-CM | POA: Diagnosis not present

## 2014-10-09 DIAGNOSIS — D631 Anemia in chronic kidney disease: Secondary | ICD-10-CM | POA: Diagnosis not present

## 2014-10-09 DIAGNOSIS — N186 End stage renal disease: Secondary | ICD-10-CM | POA: Diagnosis not present

## 2014-10-09 DIAGNOSIS — D689 Coagulation defect, unspecified: Secondary | ICD-10-CM | POA: Diagnosis not present

## 2014-10-13 ENCOUNTER — Emergency Department (HOSPITAL_COMMUNITY): Payer: Medicare Other

## 2014-10-13 ENCOUNTER — Encounter (HOSPITAL_COMMUNITY): Payer: Self-pay | Admitting: Family Medicine

## 2014-10-13 ENCOUNTER — Other Ambulatory Visit: Payer: Self-pay

## 2014-10-13 ENCOUNTER — Emergency Department (HOSPITAL_COMMUNITY)
Admission: EM | Admit: 2014-10-13 | Discharge: 2014-10-13 | Disposition: A | Discharge status: Home / Self Care | Payer: Medicare Other | Attending: Emergency Medicine | Admitting: Emergency Medicine

## 2014-10-13 DIAGNOSIS — G40909 Epilepsy, unspecified, not intractable, without status epilepticus: Secondary | ICD-10-CM | POA: Insufficient documentation

## 2014-10-13 DIAGNOSIS — J441 Chronic obstructive pulmonary disease with (acute) exacerbation: Secondary | ICD-10-CM | POA: Diagnosis not present

## 2014-10-13 DIAGNOSIS — I129 Hypertensive chronic kidney disease with stage 1 through stage 4 chronic kidney disease, or unspecified chronic kidney disease: Secondary | ICD-10-CM | POA: Diagnosis not present

## 2014-10-13 DIAGNOSIS — N189 Chronic kidney disease, unspecified: Secondary | ICD-10-CM | POA: Insufficient documentation

## 2014-10-13 DIAGNOSIS — R079 Chest pain, unspecified: Secondary | ICD-10-CM | POA: Diagnosis not present

## 2014-10-13 DIAGNOSIS — R630 Anorexia: Secondary | ICD-10-CM | POA: Diagnosis not present

## 2014-10-13 DIAGNOSIS — Z992 Dependence on renal dialysis: Secondary | ICD-10-CM | POA: Insufficient documentation

## 2014-10-13 DIAGNOSIS — N186 End stage renal disease: Secondary | ICD-10-CM | POA: Diagnosis not present

## 2014-10-13 DIAGNOSIS — R011 Cardiac murmur, unspecified: Secondary | ICD-10-CM | POA: Insufficient documentation

## 2014-10-13 DIAGNOSIS — F319 Bipolar disorder, unspecified: Secondary | ICD-10-CM | POA: Insufficient documentation

## 2014-10-13 DIAGNOSIS — R1030 Lower abdominal pain, unspecified: Secondary | ICD-10-CM | POA: Diagnosis not present

## 2014-10-13 DIAGNOSIS — R040 Epistaxis: Secondary | ICD-10-CM | POA: Diagnosis not present

## 2014-10-13 DIAGNOSIS — J45909 Unspecified asthma, uncomplicated: Secondary | ICD-10-CM | POA: Diagnosis not present

## 2014-10-13 DIAGNOSIS — J449 Chronic obstructive pulmonary disease, unspecified: Secondary | ICD-10-CM | POA: Diagnosis not present

## 2014-10-13 DIAGNOSIS — Z720 Tobacco use: Secondary | ICD-10-CM | POA: Insufficient documentation

## 2014-10-13 DIAGNOSIS — K922 Gastrointestinal hemorrhage, unspecified: Secondary | ICD-10-CM | POA: Insufficient documentation

## 2014-10-13 DIAGNOSIS — R6883 Chills (without fever): Secondary | ICD-10-CM | POA: Diagnosis not present

## 2014-10-13 DIAGNOSIS — I120 Hypertensive chronic kidney disease with stage 5 chronic kidney disease or end stage renal disease: Secondary | ICD-10-CM | POA: Diagnosis not present

## 2014-10-13 DIAGNOSIS — R2240 Localized swelling, mass and lump, unspecified lower limb: Secondary | ICD-10-CM | POA: Diagnosis not present

## 2014-10-13 DIAGNOSIS — R109 Unspecified abdominal pain: Secondary | ICD-10-CM

## 2014-10-13 DIAGNOSIS — R0602 Shortness of breath: Secondary | ICD-10-CM

## 2014-10-13 DIAGNOSIS — E119 Type 2 diabetes mellitus without complications: Secondary | ICD-10-CM | POA: Diagnosis not present

## 2014-10-13 LAB — TYPE AND SCREEN
ABO/RH(D): A POS
Antibody Screen: NEGATIVE

## 2014-10-13 LAB — COMPREHENSIVE METABOLIC PANEL
ALT: 16 U/L (ref 14–54)
AST: 18 U/L (ref 15–41)
Albumin: 2.9 g/dL — ABNORMAL LOW (ref 3.5–5.0)
Alkaline Phosphatase: 88 U/L (ref 38–126)
Anion gap: 14 (ref 5–15)
BUN: 96 mg/dL — ABNORMAL HIGH (ref 6–20)
CO2: 18 mmol/L — ABNORMAL LOW (ref 22–32)
Calcium: 8.4 mg/dL — ABNORMAL LOW (ref 8.9–10.3)
Chloride: 104 mmol/L (ref 101–111)
Creatinine, Ser: 6.56 mg/dL — ABNORMAL HIGH (ref 0.44–1.00)
GFR calc Af Amer: 7 mL/min — ABNORMAL LOW (ref >60)
GFR calc non Af Amer: 6 mL/min — ABNORMAL LOW (ref >60)
Glucose, Bld: 101 mg/dL — ABNORMAL HIGH (ref 65–99)
Potassium: 5.4 mmol/L — ABNORMAL HIGH (ref 3.5–5.1)
Sodium: 136 mmol/L (ref 135–145)
Total Bilirubin: 0.4 mg/dL (ref 0.3–1.2)
Total Protein: 6.7 g/dL (ref 6.5–8.1)

## 2014-10-13 LAB — CBC
HCT: 37.9 % (ref 36.0–46.0)
Hemoglobin: 11.8 g/dL — ABNORMAL LOW (ref 12.0–15.0)
MCH: 30 pg (ref 26.0–34.0)
MCHC: 31.1 g/dL (ref 30.0–36.0)
MCV: 96.4 fL (ref 78.0–100.0)
Platelets: 194 10*3/uL (ref 150–400)
RBC: 3.93 MIL/uL (ref 3.87–5.11)
RDW: 15.3 % (ref 11.5–15.5)
WBC: 5.1 10*3/uL (ref 4.0–10.5)

## 2014-10-13 LAB — BRAIN NATRIURETIC PEPTIDE: B Natriuretic Peptide: 659.1 pg/mL — ABNORMAL HIGH (ref 0.0–100.0)

## 2014-10-13 LAB — VALPROIC ACID LEVEL: Valproic Acid Lvl: 55 ug/mL (ref 50.0–100.0)

## 2014-10-13 LAB — TROPONIN I: Troponin I: <0.03 ng/mL (ref <0.031)

## 2014-10-13 NOTE — ED Notes (Signed)
Pt bled approx 8 ccs of blood on gown from IV. She became angry and verbally aggressive. Stating she was not leaving until she spoke with the EDP. He was made aware of the situation.

## 2014-10-13 NOTE — ED Notes (Signed)
Pt in hallway yelling at staff. 2 RNs attempted to calm patient and apologized for blood on gown and offered to allow her to speak with the charge nurse. She refused and states she wants to go home. Pt stable for d/c.

## 2014-10-13 NOTE — ED Notes (Signed)
Pt angry over being d/c and states we did not do anything. RN and EDP explained the bloodwork that was drawn and the X-ray that was done. Pt remains angry and states she thinks she should stay.

## 2014-10-13 NOTE — ED Notes (Signed)
Clotted blood samples, need to reorder BNP, CBC

## 2014-10-13 NOTE — ED Notes (Signed)
Pt here for rectal bleeding, nosebleeds, chest pain and SOB. sts lower abd pain. sts last dialysis Thursday.

## 2014-10-13 NOTE — ED Provider Notes (Signed)
CSN: [id number]     Arrival date & time 10/13/14  1250 History   First MD Initiated Contact with Patient 10/13/14 1514     Chief Complaint  Patient presents with  . Rectal Bleeding  . Chest Pain  . Shortness of Breath     (Consider location/radiation/quality/duration/timing/severity/associated sxs/prior Treatment) Patient is a 54 y.o. female presenting with hematochezia, chest pain, and shortness of breath. The history is provided by the patient.  Rectal Bleeding Associated symptoms: abdominal pain and epistaxis   Associated symptoms: no fever and no vomiting   Chest Pain Associated symptoms: abdominal pain and shortness of breath   Associated symptoms: no back pain, no fever, no headache, no nausea, no numbness, not vomiting and no weakness   Shortness of Breath Associated symptoms: abdominal pain and chest pain   Associated symptoms: no fever, no headaches, no rash and no vomiting    patient states that she has had blood with wiping after having bowel movements for the last couple days. States the blood is red. States she is not sure if it is in the stool or not. She states that she has also had some nosebleeds. She states that the blood is stripped from her nose. No fevers. States she has had chills. States she has a headache. States she has chest pain states she has shortness of breath. States she has unsteadiness. States she has not had problems with low hemoglobin before but has had been transfused for anemia in the past. She is a dialysis patient. She is supposed be dialyzed on Tuesday Thursday Saturday but states she was not dialyzed since last Saturday, today being Monday. She is not on anticoagulation. States she just feels bad. She states she's also been having some vaginal bleeding. She states lower abdominal pain is dull.  Past Medical History  Diagnosis Date  . Mental disorder   . Depression   . Hypertension   . Overdose   . Tobacco use disorder 11/13/2012  . Complication of  anesthesia     difficulty going to sleep  . Chronic kidney disease     06/11/13- not on dialysis  . Shortness of breath     lying down flat  . PTSD (post-traumatic stress disorder)   . Asthma   . COPD (chronic obstructive pulmonary disease)   . Heart murmur   . GERD (gastroesophageal reflux disease)   . Seizures     "passsed out"  . History of blood transfusion   . Diabetes mellitus without complication     denies   Past Surgical History  Procedure Laterality Date  . Right knee replacement      she says it was last year.  . Esophagogastroduodenoscopy Left 11/14/2012    Procedure: ESOPHAGOGASTRODUODENOSCOPY (EGD);  Surgeon: [name], MD;  Location: [facility];  Service: Endoscopy;  Laterality: Left;  . Joint replacement Right     knee  . Parathyroidectomy    . Av fistula placement Right 06/12/2013    Procedure: ARTERIOVENOUS (AV) FISTULA CREATION; RIGHT  BASILIC VEIN TRANSPOSITION with Intraoperative ultrasound;  Surgeon: Mal [name], MD;  Location: [facility] OR;  Service: Vascular;  Laterality: Right;   Family History  Problem Relation Age of Onset  . Diabetes Mother   . Hyperlipidemia Mother   . Hypertension Mother   . Diabetes Father   . Hypertension Father   . Hyperlipidemia Father    History  Substance Use Topics  . Smoking status: Current Every Day Smoker -- 2.00 packs/day for  40 years    Types: Cigarettes, Cigars  . Smokeless tobacco: Never Used  . Alcohol Use: No     Comment: none in over a month per daughter   OB History    No data available     Review of Systems  Constitutional: Positive for chills and appetite change. Negative for fever and activity change.  HENT: Positive for nosebleeds. Negative for facial swelling.   Eyes: Negative for pain.  Respiratory: Positive for shortness of breath. Negative for chest tightness.   Cardiovascular: Positive for chest pain and leg swelling.  Gastrointestinal: Positive for abdominal pain, blood in stool and  hematochezia. Negative for nausea, vomiting and diarrhea.  Genitourinary: Positive for pelvic pain. Negative for flank pain.  Musculoskeletal: Negative for back pain, joint swelling and neck stiffness.  Skin: Negative for rash.  Neurological: Negative for weakness, numbness and headaches.  Hematological: Does not bruise/bleed easily.  Psychiatric/Behavioral: Negative for behavioral problems.      Allergies  Codeine sulfate; Haldol; Risperidone and related; Depakote; Gabapentin; Invega; Trazodone and nefazodone; and Vistaril  Home Medications   Prior to Admission medications   Medication Sig Start Date End Date Taking? Authorizing Provider  acetaminophen (TYLENOL) 325 MG tablet Take 2 tablets (650 mg total) by mouth every 4 (four) hours as needed for fever, headache or mild pain. 05/20/14  Yes Bynum Bellows, MD  albuterol (PROVENTIL) (2.5 MG/3ML) 0.083% nebulizer solution Take 3 mLs (2.5 mg total) by nebulization every 4 (four) hours as needed for wheezing or shortness of breath. 04/03/14  Yes Encarnacion Slates, NP  Darbepoetin Alfa (ARANESP) 60 MCG/0.3ML SOSY injection Inject 0.3 mLs (60 mcg total) into the vein every Thursday with hemodialysis. 05/20/14  Yes Bynum Bellows, MD  divalproex (DEPAKOTE ER) 500 MG 24 hr tablet Take 500-1,000 mg by mouth 2 (two) times daily. Take 500 mg in the morning and 1000 mg in the evening. 09/12/14  Yes Historical Provider, MD  doxepin (SINEQUAN) 25 MG capsule Take 25 mg by mouth at bedtime. 09/12/14  Yes Historical Provider, MD  ferric gluconate 250 mg in sodium chloride 0.9 % 100 mL Inject 250 mg into the vein every Tuesday, Thursday, Saturday, and Sunday. 05/20/14  Yes Srikar Janna Arch, MD  furosemide (LASIX) 40 MG tablet Take 40 mg by mouth 2 (two) times daily.   Yes Historical Provider, MD  ibuprofen (ADVIL,MOTRIN) 200 MG tablet Take 600 mg by mouth every 6 (six) hours as needed for mild pain, moderate pain or cramping.   Yes Historical Provider, MD  LORazepam  (ATIVAN) 1 MG tablet Take 1 mg by mouth 3 (three) times a week. Take on Tuesday, Thursday, and Saturday 09/23/14  Yes Historical Provider, MD  metoprolol tartrate (LOPRESSOR) 25 MG tablet Take 0.5 tablets (12.5 mg total) by mouth 2 (two) times daily. For HTN 05/20/14  Yes Srikar Janna Arch, MD  multivitamin (RENA-VIT) TABS tablet Take 1 tablet by mouth at bedtime. 05/20/14  Yes Srikar Janna Arch, MD  PROAIR HFA 108 (90 BASE) MCG/ACT inhaler Inhale 1 puff into the lungs every 4 (four) hours as needed. wheezing 07/19/14  Yes Historical Provider, MD  SENSIPAR 30 MG tablet Take 30 mg by mouth daily. 09/12/14  Yes Historical Provider, MD  simvastatin (ZOCOR) 10 MG tablet Take 1 tablet (10 mg total) by mouth at bedtime. For high cholesterol/fats 04/03/14  Yes Encarnacion Slates, NP  ziprasidone (GEODON) 80 MG capsule Take 80 mg by mouth 2 (two) times daily. 09/12/14  Yes  Historical Provider, MD  bisacodyl (DULCOLAX) 5 MG EC tablet Take 1 tablet (5 mg total) by mouth daily as needed for moderate constipation. Patient not taking: Reported on 10/13/2014 05/20/14   Bynum Bellows, MD  divalproex (DEPAKOTE) 250 MG DR tablet Take 3 tablets (750 mg total) by mouth daily. Patient not taking: Reported on 10/13/2014 08/01/14   Delfina Redwood, MD  docusate sodium 100 MG CAPS Take 100 mg by mouth 2 (two) times daily. Patient not taking: Reported on 10/13/2014 05/20/14   Bynum Bellows, MD  Fluticasone-Salmeterol (ADVAIR) 100-50 MCG/DOSE AEPB Inhale 1 puff into the lungs 2 (two) times daily. For asthma Patient not taking: Reported on 10/13/2014 04/03/14   Encarnacion Slates, NP  nicotine (NICODERM CQ - DOSED IN MG/24 HOURS) 21 mg/24hr patch Place 1 patch (21 mg total) onto the skin daily. Patient not taking: Reported on 10/13/2014 05/20/14   Bynum Bellows, MD  Nutritional Supplements (FEEDING SUPPLEMENT, NEPRO CARB STEADY,) LIQD Take 237 mLs by mouth as needed (missed meal during dialysis.). Patient not taking: Reported on 10/13/2014 05/20/14   Bynum Bellows, MD  pantoprazole (PROTONIX) 40 MG tablet Take 1 tablet (40 mg total) by mouth daily. Patient not taking: Reported on 10/13/2014 08/01/14   Delfina Redwood, MD  ziprasidone (GEODON) 60 MG capsule Take 1 capsule (60 mg total) by mouth 2 (two) times daily with a meal. For mood control Patient not taking: Reported on 10/13/2014 08/01/14   Delfina Redwood, MD   BP 118/72 mmHg  Pulse 87  Temp(Src) 97.4 F (36.3 C) (Oral)  Resp 18  SpO2 97%  LMP 05/09/2008 Physical Exam  Constitutional: She is oriented to person, place, and time. She appears well-developed and well-nourished.  HENT:  Head: Atraumatic.  Neck: Neck supple.  Cardiovascular: Normal rate and regular rhythm.   Pulmonary/Chest: Effort normal. She has no rales.  Abdominal: Soft. She exhibits no mass. There is no tenderness. There is no rebound and no guarding.  Musculoskeletal: Normal range of motion.  Neurological: She is alert and oriented to person, place, and time.  Skin: Skin is warm.    ED Course  Procedures (including critical care time) Labs Review Labs Reviewed  COMPREHENSIVE METABOLIC PANEL - Abnormal; Notable for the following:    Potassium 5.4 (*)    CO2 18 (*)    Glucose, Bld 101 (*)    BUN 96 (*)    Creatinine, Ser 6.56 (*)    Calcium 8.4 (*)    Albumin 2.9 (*)    GFR calc non Af Amer 6 (*)    GFR calc Af Amer 7 (*)    All other components within normal limits  BRAIN NATRIURETIC PEPTIDE - Abnormal; Notable for the following:    B Natriuretic Peptide 659.1 (*)    All other components within normal limits  CBC - Abnormal; Notable for the following:    Hemoglobin 11.8 (*)    All other components within normal limits  TROPONIN I  VALPROIC ACID LEVEL  TYPE AND SCREEN    Imaging Review Dg Chest 2 View  10/13/2014   CLINICAL DATA:  Rectal bleeding, nosebleeds, chest pain, shortness of breath, lower abdominal pain for 1 week, dialysis patient, hypertension, asthma, COPD, diabetes, smoker  EXAM:  CHEST  2 VIEW  COMPARISON:  07/29/2014  FINDINGS: Enlargement of cardiac silhouette.  Tortuous aorta.  Mediastinal contours and pulmonary vascularity normal.  Subsegmental atelectasis mid RIGHT lung stable.  Chronic bronchitic changes without  infiltrate, pleural effusion or pneumothorax.  Bones demineralized.  IMPRESSION: Chronic bronchitic changes with chronic subsegmental atelectasis in lingula.  Enlargement of cardiac silhouette.  No acute abnormalities.   Electronically Signed   By: Lavonia Dana M.D.   On: 10/13/2014 13:46     EKG Interpretation   Date/Time:  Monday October 13 2014 13:01:59 EDT Ventricular Rate:  84 PR Interval:  178 QRS Duration: 96 QT Interval:  388 QTC Calculation: 458 R Axis:   25 Text Interpretation:  Normal sinus rhythm Normal ECG Confirmed by  Alvino Chapel  MD, Ovid Curd [phone]) on 10/14/2014 12:15:31 AM      MDM   Final diagnoses:  Abdominal pain, unspecified abdominal location  End stage renal disease on dialysis  Gastrointestinal hemorrhage, unspecified gastritis, unspecified gastrointestinal hemorrhage type    Patient with multiple complaints. Abdominal pain. Dialysis patient. Fatigue. States she feels bad. Hemoglobin is higher than her baseline. Patient later refused rectal exam. Will discharge home follow-up with primary care doctor    Davonna Belling, MD 10/14/14 [phone]

## 2014-10-13 NOTE — Discharge Instructions (Signed)
Follow-up through Dr. for further management of your possible GI bleeding.  Abdominal Pain Many things can cause abdominal pain. Usually, abdominal pain is not caused by a disease and will improve without treatment. It can often be observed and treated at home. Your health care provider will do a physical exam and possibly order blood tests and X-rays to help determine the seriousness of your pain. However, in many cases, more time must pass before a clear cause of the pain can be found. Before that point, your health care provider may not know if you need more testing or further treatment. HOME CARE INSTRUCTIONS  Monitor your abdominal pain for any changes. The following actions may help to alleviate any discomfort you are experiencing:  Only take over-the-counter or prescription medicines as directed by your health care provider.  Do not take laxatives unless directed to do so by your health care provider.  Try a clear liquid diet (broth, tea, or water) as directed by your health care provider. Slowly move to a bland diet as tolerated. SEEK MEDICAL CARE IF:  You have unexplained abdominal pain.  You have abdominal pain associated with nausea or diarrhea.  You have pain when you urinate or have a bowel movement.  You experience abdominal pain that wakes you in the night.  You have abdominal pain that is worsened or improved by eating food.  You have abdominal pain that is worsened with eating fatty foods.  You have a fever. SEEK IMMEDIATE MEDICAL CARE IF:   Your pain does not go away within 2 hours.  You keep throwing up (vomiting).  Your pain is felt only in portions of the abdomen, such as the right side or the left lower portion of the abdomen.  You pass bloody or black tarry stools. MAKE SURE YOU:  Understand these instructions.   Will watch your condition.   Will get help right away if you are not doing well or get worse.  Document Released: 02/02/2005 Document  Revised: 04/30/2013 Document Reviewed: 01/02/2013 [facility] Patient Information 2015 [place], [place]. This information is not intended to replace advice given to you by your health care provider. Make sure you discuss any questions you have with your health care provider.

## 2014-10-14 DIAGNOSIS — N2581 Secondary hyperparathyroidism of renal origin: Secondary | ICD-10-CM | POA: Diagnosis not present

## 2014-10-14 DIAGNOSIS — D631 Anemia in chronic kidney disease: Secondary | ICD-10-CM | POA: Diagnosis not present

## 2014-10-14 DIAGNOSIS — E8779 Other fluid overload: Secondary | ICD-10-CM | POA: Diagnosis not present

## 2014-10-14 DIAGNOSIS — R52 Pain, unspecified: Secondary | ICD-10-CM | POA: Diagnosis not present

## 2014-10-14 DIAGNOSIS — N186 End stage renal disease: Secondary | ICD-10-CM | POA: Diagnosis not present

## 2014-10-14 DIAGNOSIS — D689 Coagulation defect, unspecified: Secondary | ICD-10-CM | POA: Diagnosis not present

## 2014-10-15 DIAGNOSIS — R7309 Other abnormal glucose: Secondary | ICD-10-CM | POA: Diagnosis not present

## 2014-10-15 DIAGNOSIS — F319 Bipolar disorder, unspecified: Secondary | ICD-10-CM | POA: Diagnosis not present

## 2014-10-15 DIAGNOSIS — Z124 Encounter for screening for malignant neoplasm of cervix: Secondary | ICD-10-CM | POA: Diagnosis not present

## 2014-10-15 DIAGNOSIS — N760 Acute vaginitis: Secondary | ICD-10-CM | POA: Diagnosis not present

## 2014-10-15 DIAGNOSIS — K649 Unspecified hemorrhoids: Secondary | ICD-10-CM | POA: Diagnosis not present

## 2014-10-15 DIAGNOSIS — I10 Essential (primary) hypertension: Secondary | ICD-10-CM | POA: Diagnosis not present

## 2014-10-15 DIAGNOSIS — N186 End stage renal disease: Secondary | ICD-10-CM | POA: Diagnosis not present

## 2014-10-17 DIAGNOSIS — J449 Chronic obstructive pulmonary disease, unspecified: Secondary | ICD-10-CM | POA: Diagnosis not present

## 2014-10-17 DIAGNOSIS — N186 End stage renal disease: Secondary | ICD-10-CM | POA: Diagnosis not present

## 2014-10-18 DIAGNOSIS — R52 Pain, unspecified: Secondary | ICD-10-CM | POA: Diagnosis not present

## 2014-10-18 DIAGNOSIS — J449 Chronic obstructive pulmonary disease, unspecified: Secondary | ICD-10-CM | POA: Diagnosis not present

## 2014-10-18 DIAGNOSIS — N186 End stage renal disease: Secondary | ICD-10-CM | POA: Diagnosis not present

## 2014-10-18 DIAGNOSIS — D631 Anemia in chronic kidney disease: Secondary | ICD-10-CM | POA: Diagnosis not present

## 2014-10-18 DIAGNOSIS — N2581 Secondary hyperparathyroidism of renal origin: Secondary | ICD-10-CM | POA: Diagnosis not present

## 2014-10-18 DIAGNOSIS — E8779 Other fluid overload: Secondary | ICD-10-CM | POA: Diagnosis not present

## 2014-10-18 DIAGNOSIS — D689 Coagulation defect, unspecified: Secondary | ICD-10-CM | POA: Diagnosis not present

## 2014-10-18 DIAGNOSIS — R269 Unspecified abnormalities of gait and mobility: Secondary | ICD-10-CM | POA: Diagnosis not present

## 2014-10-20 DIAGNOSIS — E8779 Other fluid overload: Secondary | ICD-10-CM | POA: Diagnosis not present

## 2014-10-20 DIAGNOSIS — N2581 Secondary hyperparathyroidism of renal origin: Secondary | ICD-10-CM | POA: Diagnosis not present

## 2014-10-20 DIAGNOSIS — D631 Anemia in chronic kidney disease: Secondary | ICD-10-CM | POA: Diagnosis not present

## 2014-10-20 DIAGNOSIS — D689 Coagulation defect, unspecified: Secondary | ICD-10-CM | POA: Diagnosis not present

## 2014-10-20 DIAGNOSIS — R52 Pain, unspecified: Secondary | ICD-10-CM | POA: Diagnosis not present

## 2014-10-20 DIAGNOSIS — N186 End stage renal disease: Secondary | ICD-10-CM | POA: Diagnosis not present

## 2014-10-21 DIAGNOSIS — R52 Pain, unspecified: Secondary | ICD-10-CM | POA: Diagnosis not present

## 2014-10-21 DIAGNOSIS — E8779 Other fluid overload: Secondary | ICD-10-CM | POA: Diagnosis not present

## 2014-10-21 DIAGNOSIS — N2581 Secondary hyperparathyroidism of renal origin: Secondary | ICD-10-CM | POA: Diagnosis not present

## 2014-10-21 DIAGNOSIS — N186 End stage renal disease: Secondary | ICD-10-CM | POA: Diagnosis not present

## 2014-10-21 DIAGNOSIS — D631 Anemia in chronic kidney disease: Secondary | ICD-10-CM | POA: Diagnosis not present

## 2014-10-21 DIAGNOSIS — D689 Coagulation defect, unspecified: Secondary | ICD-10-CM | POA: Diagnosis not present

## 2014-10-23 DIAGNOSIS — R52 Pain, unspecified: Secondary | ICD-10-CM | POA: Diagnosis not present

## 2014-10-23 DIAGNOSIS — N186 End stage renal disease: Secondary | ICD-10-CM | POA: Diagnosis not present

## 2014-10-23 DIAGNOSIS — D689 Coagulation defect, unspecified: Secondary | ICD-10-CM | POA: Diagnosis not present

## 2014-10-23 DIAGNOSIS — N2581 Secondary hyperparathyroidism of renal origin: Secondary | ICD-10-CM | POA: Diagnosis not present

## 2014-10-23 DIAGNOSIS — E8779 Other fluid overload: Secondary | ICD-10-CM | POA: Diagnosis not present

## 2014-10-23 DIAGNOSIS — D631 Anemia in chronic kidney disease: Secondary | ICD-10-CM | POA: Diagnosis not present

## 2014-10-29 DIAGNOSIS — Z1239 Encounter for other screening for malignant neoplasm of breast: Secondary | ICD-10-CM | POA: Diagnosis not present

## 2014-10-29 DIAGNOSIS — K649 Unspecified hemorrhoids: Secondary | ICD-10-CM | POA: Diagnosis not present

## 2014-10-29 DIAGNOSIS — I10 Essential (primary) hypertension: Secondary | ICD-10-CM | POA: Diagnosis not present

## 2014-10-29 DIAGNOSIS — N186 End stage renal disease: Secondary | ICD-10-CM | POA: Diagnosis not present

## 2014-10-29 DIAGNOSIS — R7309 Other abnormal glucose: Secondary | ICD-10-CM | POA: Diagnosis not present

## 2014-10-30 DIAGNOSIS — E8779 Other fluid overload: Secondary | ICD-10-CM | POA: Diagnosis not present

## 2014-10-30 DIAGNOSIS — N2581 Secondary hyperparathyroidism of renal origin: Secondary | ICD-10-CM | POA: Diagnosis not present

## 2014-10-30 DIAGNOSIS — R52 Pain, unspecified: Secondary | ICD-10-CM | POA: Diagnosis not present

## 2014-10-30 DIAGNOSIS — N186 End stage renal disease: Secondary | ICD-10-CM | POA: Diagnosis not present

## 2014-10-30 DIAGNOSIS — D689 Coagulation defect, unspecified: Secondary | ICD-10-CM | POA: Diagnosis not present

## 2014-10-30 DIAGNOSIS — D631 Anemia in chronic kidney disease: Secondary | ICD-10-CM | POA: Diagnosis not present

## 2014-11-01 DIAGNOSIS — D631 Anemia in chronic kidney disease: Secondary | ICD-10-CM | POA: Diagnosis not present

## 2014-11-01 DIAGNOSIS — R52 Pain, unspecified: Secondary | ICD-10-CM | POA: Diagnosis not present

## 2014-11-01 DIAGNOSIS — N2581 Secondary hyperparathyroidism of renal origin: Secondary | ICD-10-CM | POA: Diagnosis not present

## 2014-11-01 DIAGNOSIS — E8779 Other fluid overload: Secondary | ICD-10-CM | POA: Diagnosis not present

## 2014-11-01 DIAGNOSIS — N186 End stage renal disease: Secondary | ICD-10-CM | POA: Diagnosis not present

## 2014-11-01 DIAGNOSIS — D689 Coagulation defect, unspecified: Secondary | ICD-10-CM | POA: Diagnosis not present

## 2014-11-04 DIAGNOSIS — K219 Gastro-esophageal reflux disease without esophagitis: Secondary | ICD-10-CM | POA: Diagnosis not present

## 2014-11-04 DIAGNOSIS — D689 Coagulation defect, unspecified: Secondary | ICD-10-CM | POA: Diagnosis not present

## 2014-11-04 DIAGNOSIS — E8779 Other fluid overload: Secondary | ICD-10-CM | POA: Diagnosis not present

## 2014-11-04 DIAGNOSIS — K649 Unspecified hemorrhoids: Secondary | ICD-10-CM | POA: Diagnosis not present

## 2014-11-04 DIAGNOSIS — N186 End stage renal disease: Secondary | ICD-10-CM | POA: Diagnosis not present

## 2014-11-04 DIAGNOSIS — D631 Anemia in chronic kidney disease: Secondary | ICD-10-CM | POA: Diagnosis not present

## 2014-11-04 DIAGNOSIS — I10 Essential (primary) hypertension: Secondary | ICD-10-CM | POA: Diagnosis not present

## 2014-11-04 DIAGNOSIS — R52 Pain, unspecified: Secondary | ICD-10-CM | POA: Diagnosis not present

## 2014-11-04 DIAGNOSIS — R7309 Other abnormal glucose: Secondary | ICD-10-CM | POA: Diagnosis not present

## 2014-11-04 DIAGNOSIS — N2581 Secondary hyperparathyroidism of renal origin: Secondary | ICD-10-CM | POA: Diagnosis not present

## 2014-11-05 DIAGNOSIS — J449 Chronic obstructive pulmonary disease, unspecified: Secondary | ICD-10-CM | POA: Diagnosis not present

## 2014-11-05 DIAGNOSIS — N186 End stage renal disease: Secondary | ICD-10-CM | POA: Diagnosis not present

## 2014-11-06 DIAGNOSIS — Z992 Dependence on renal dialysis: Secondary | ICD-10-CM | POA: Diagnosis not present

## 2014-11-06 DIAGNOSIS — N186 End stage renal disease: Secondary | ICD-10-CM | POA: Diagnosis not present

## 2014-11-06 DIAGNOSIS — I129 Hypertensive chronic kidney disease with stage 1 through stage 4 chronic kidney disease, or unspecified chronic kidney disease: Secondary | ICD-10-CM | POA: Diagnosis not present

## 2014-11-07 DIAGNOSIS — D689 Coagulation defect, unspecified: Secondary | ICD-10-CM | POA: Diagnosis not present

## 2014-11-07 DIAGNOSIS — R52 Pain, unspecified: Secondary | ICD-10-CM | POA: Diagnosis not present

## 2014-11-07 DIAGNOSIS — D631 Anemia in chronic kidney disease: Secondary | ICD-10-CM | POA: Diagnosis not present

## 2014-11-07 DIAGNOSIS — N2581 Secondary hyperparathyroidism of renal origin: Secondary | ICD-10-CM | POA: Diagnosis not present

## 2014-11-07 DIAGNOSIS — N186 End stage renal disease: Secondary | ICD-10-CM | POA: Diagnosis not present

## 2014-11-08 DIAGNOSIS — D689 Coagulation defect, unspecified: Secondary | ICD-10-CM | POA: Diagnosis not present

## 2014-11-08 DIAGNOSIS — D631 Anemia in chronic kidney disease: Secondary | ICD-10-CM | POA: Diagnosis not present

## 2014-11-08 DIAGNOSIS — N186 End stage renal disease: Secondary | ICD-10-CM | POA: Diagnosis not present

## 2014-11-08 DIAGNOSIS — N2581 Secondary hyperparathyroidism of renal origin: Secondary | ICD-10-CM | POA: Diagnosis not present

## 2014-11-08 DIAGNOSIS — R52 Pain, unspecified: Secondary | ICD-10-CM | POA: Diagnosis not present

## 2014-11-11 ENCOUNTER — Other Ambulatory Visit: Payer: Self-pay | Admitting: Internal Medicine

## 2014-11-15 DIAGNOSIS — N186 End stage renal disease: Secondary | ICD-10-CM | POA: Diagnosis not present

## 2014-11-15 DIAGNOSIS — R52 Pain, unspecified: Secondary | ICD-10-CM | POA: Diagnosis not present

## 2014-11-15 DIAGNOSIS — D689 Coagulation defect, unspecified: Secondary | ICD-10-CM | POA: Diagnosis not present

## 2014-11-15 DIAGNOSIS — N2581 Secondary hyperparathyroidism of renal origin: Secondary | ICD-10-CM | POA: Diagnosis not present

## 2014-11-15 DIAGNOSIS — D631 Anemia in chronic kidney disease: Secondary | ICD-10-CM | POA: Diagnosis not present

## 2014-11-16 DIAGNOSIS — N186 End stage renal disease: Secondary | ICD-10-CM | POA: Diagnosis not present

## 2014-11-16 DIAGNOSIS — J449 Chronic obstructive pulmonary disease, unspecified: Secondary | ICD-10-CM | POA: Diagnosis not present

## 2014-11-17 DIAGNOSIS — R269 Unspecified abnormalities of gait and mobility: Secondary | ICD-10-CM | POA: Diagnosis not present

## 2014-11-17 DIAGNOSIS — J449 Chronic obstructive pulmonary disease, unspecified: Secondary | ICD-10-CM | POA: Diagnosis not present

## 2014-11-20 DIAGNOSIS — D689 Coagulation defect, unspecified: Secondary | ICD-10-CM | POA: Diagnosis not present

## 2014-11-20 DIAGNOSIS — D631 Anemia in chronic kidney disease: Secondary | ICD-10-CM | POA: Diagnosis not present

## 2014-11-20 DIAGNOSIS — N186 End stage renal disease: Secondary | ICD-10-CM | POA: Diagnosis not present

## 2014-11-20 DIAGNOSIS — R52 Pain, unspecified: Secondary | ICD-10-CM | POA: Diagnosis not present

## 2014-11-20 DIAGNOSIS — N2581 Secondary hyperparathyroidism of renal origin: Secondary | ICD-10-CM | POA: Diagnosis not present

## 2014-11-22 DIAGNOSIS — R52 Pain, unspecified: Secondary | ICD-10-CM | POA: Diagnosis not present

## 2014-11-22 DIAGNOSIS — D631 Anemia in chronic kidney disease: Secondary | ICD-10-CM | POA: Diagnosis not present

## 2014-11-22 DIAGNOSIS — N186 End stage renal disease: Secondary | ICD-10-CM | POA: Diagnosis not present

## 2014-11-22 DIAGNOSIS — D689 Coagulation defect, unspecified: Secondary | ICD-10-CM | POA: Diagnosis not present

## 2014-11-22 DIAGNOSIS — N2581 Secondary hyperparathyroidism of renal origin: Secondary | ICD-10-CM | POA: Diagnosis not present

## 2014-11-25 DIAGNOSIS — N2581 Secondary hyperparathyroidism of renal origin: Secondary | ICD-10-CM | POA: Diagnosis not present

## 2014-11-25 DIAGNOSIS — N186 End stage renal disease: Secondary | ICD-10-CM | POA: Diagnosis not present

## 2014-11-25 DIAGNOSIS — D631 Anemia in chronic kidney disease: Secondary | ICD-10-CM | POA: Diagnosis not present

## 2014-11-25 DIAGNOSIS — R52 Pain, unspecified: Secondary | ICD-10-CM | POA: Diagnosis not present

## 2014-11-25 DIAGNOSIS — D689 Coagulation defect, unspecified: Secondary | ICD-10-CM | POA: Diagnosis not present

## 2014-11-29 DIAGNOSIS — D689 Coagulation defect, unspecified: Secondary | ICD-10-CM | POA: Diagnosis not present

## 2014-11-29 DIAGNOSIS — N186 End stage renal disease: Secondary | ICD-10-CM | POA: Diagnosis not present

## 2014-11-29 DIAGNOSIS — R52 Pain, unspecified: Secondary | ICD-10-CM | POA: Diagnosis not present

## 2014-11-29 DIAGNOSIS — N2581 Secondary hyperparathyroidism of renal origin: Secondary | ICD-10-CM | POA: Diagnosis not present

## 2014-11-29 DIAGNOSIS — D631 Anemia in chronic kidney disease: Secondary | ICD-10-CM | POA: Diagnosis not present

## 2014-12-06 DIAGNOSIS — R52 Pain, unspecified: Secondary | ICD-10-CM | POA: Diagnosis not present

## 2014-12-06 DIAGNOSIS — N186 End stage renal disease: Secondary | ICD-10-CM | POA: Diagnosis not present

## 2014-12-06 DIAGNOSIS — D631 Anemia in chronic kidney disease: Secondary | ICD-10-CM | POA: Diagnosis not present

## 2014-12-06 DIAGNOSIS — D689 Coagulation defect, unspecified: Secondary | ICD-10-CM | POA: Diagnosis not present

## 2014-12-06 DIAGNOSIS — N2581 Secondary hyperparathyroidism of renal origin: Secondary | ICD-10-CM | POA: Diagnosis not present

## 2014-12-07 DIAGNOSIS — I129 Hypertensive chronic kidney disease with stage 1 through stage 4 chronic kidney disease, or unspecified chronic kidney disease: Secondary | ICD-10-CM | POA: Diagnosis not present

## 2014-12-07 DIAGNOSIS — N186 End stage renal disease: Secondary | ICD-10-CM | POA: Diagnosis not present

## 2014-12-07 DIAGNOSIS — Z992 Dependence on renal dialysis: Secondary | ICD-10-CM | POA: Diagnosis not present

## 2014-12-10 DIAGNOSIS — R52 Pain, unspecified: Secondary | ICD-10-CM | POA: Diagnosis not present

## 2014-12-10 DIAGNOSIS — N2581 Secondary hyperparathyroidism of renal origin: Secondary | ICD-10-CM | POA: Diagnosis not present

## 2014-12-10 DIAGNOSIS — D631 Anemia in chronic kidney disease: Secondary | ICD-10-CM | POA: Diagnosis not present

## 2014-12-10 DIAGNOSIS — N186 End stage renal disease: Secondary | ICD-10-CM | POA: Diagnosis not present

## 2014-12-10 DIAGNOSIS — D509 Iron deficiency anemia, unspecified: Secondary | ICD-10-CM | POA: Diagnosis not present

## 2014-12-10 DIAGNOSIS — Z23 Encounter for immunization: Secondary | ICD-10-CM | POA: Diagnosis not present

## 2014-12-13 DIAGNOSIS — D631 Anemia in chronic kidney disease: Secondary | ICD-10-CM | POA: Diagnosis not present

## 2014-12-13 DIAGNOSIS — D509 Iron deficiency anemia, unspecified: Secondary | ICD-10-CM | POA: Diagnosis not present

## 2014-12-13 DIAGNOSIS — R52 Pain, unspecified: Secondary | ICD-10-CM | POA: Diagnosis not present

## 2014-12-13 DIAGNOSIS — Z23 Encounter for immunization: Secondary | ICD-10-CM | POA: Diagnosis not present

## 2014-12-13 DIAGNOSIS — N2581 Secondary hyperparathyroidism of renal origin: Secondary | ICD-10-CM | POA: Diagnosis not present

## 2014-12-13 DIAGNOSIS — N186 End stage renal disease: Secondary | ICD-10-CM | POA: Diagnosis not present

## 2014-12-16 DIAGNOSIS — D509 Iron deficiency anemia, unspecified: Secondary | ICD-10-CM | POA: Diagnosis not present

## 2014-12-16 DIAGNOSIS — N2581 Secondary hyperparathyroidism of renal origin: Secondary | ICD-10-CM | POA: Diagnosis not present

## 2014-12-16 DIAGNOSIS — N186 End stage renal disease: Secondary | ICD-10-CM | POA: Diagnosis not present

## 2014-12-16 DIAGNOSIS — R52 Pain, unspecified: Secondary | ICD-10-CM | POA: Diagnosis not present

## 2014-12-16 DIAGNOSIS — Z23 Encounter for immunization: Secondary | ICD-10-CM | POA: Diagnosis not present

## 2014-12-16 DIAGNOSIS — D631 Anemia in chronic kidney disease: Secondary | ICD-10-CM | POA: Diagnosis not present

## 2014-12-17 DIAGNOSIS — J449 Chronic obstructive pulmonary disease, unspecified: Secondary | ICD-10-CM | POA: Diagnosis not present

## 2014-12-17 DIAGNOSIS — N186 End stage renal disease: Secondary | ICD-10-CM | POA: Diagnosis not present

## 2014-12-18 DIAGNOSIS — R269 Unspecified abnormalities of gait and mobility: Secondary | ICD-10-CM | POA: Diagnosis not present

## 2014-12-18 DIAGNOSIS — J449 Chronic obstructive pulmonary disease, unspecified: Secondary | ICD-10-CM | POA: Diagnosis not present

## 2014-12-20 DIAGNOSIS — Z23 Encounter for immunization: Secondary | ICD-10-CM | POA: Diagnosis not present

## 2014-12-20 DIAGNOSIS — D631 Anemia in chronic kidney disease: Secondary | ICD-10-CM | POA: Diagnosis not present

## 2014-12-20 DIAGNOSIS — R52 Pain, unspecified: Secondary | ICD-10-CM | POA: Diagnosis not present

## 2014-12-20 DIAGNOSIS — N186 End stage renal disease: Secondary | ICD-10-CM | POA: Diagnosis not present

## 2014-12-20 DIAGNOSIS — D509 Iron deficiency anemia, unspecified: Secondary | ICD-10-CM | POA: Diagnosis not present

## 2014-12-20 DIAGNOSIS — N2581 Secondary hyperparathyroidism of renal origin: Secondary | ICD-10-CM | POA: Diagnosis not present

## 2014-12-23 DIAGNOSIS — N2581 Secondary hyperparathyroidism of renal origin: Secondary | ICD-10-CM | POA: Diagnosis not present

## 2014-12-23 DIAGNOSIS — Z23 Encounter for immunization: Secondary | ICD-10-CM | POA: Diagnosis not present

## 2014-12-23 DIAGNOSIS — D509 Iron deficiency anemia, unspecified: Secondary | ICD-10-CM | POA: Diagnosis not present

## 2014-12-23 DIAGNOSIS — D631 Anemia in chronic kidney disease: Secondary | ICD-10-CM | POA: Diagnosis not present

## 2014-12-23 DIAGNOSIS — N186 End stage renal disease: Secondary | ICD-10-CM | POA: Diagnosis not present

## 2014-12-23 DIAGNOSIS — R52 Pain, unspecified: Secondary | ICD-10-CM | POA: Diagnosis not present

## 2014-12-27 DIAGNOSIS — Z23 Encounter for immunization: Secondary | ICD-10-CM | POA: Diagnosis not present

## 2014-12-27 DIAGNOSIS — R52 Pain, unspecified: Secondary | ICD-10-CM | POA: Diagnosis not present

## 2014-12-27 DIAGNOSIS — N2581 Secondary hyperparathyroidism of renal origin: Secondary | ICD-10-CM | POA: Diagnosis not present

## 2014-12-27 DIAGNOSIS — D509 Iron deficiency anemia, unspecified: Secondary | ICD-10-CM | POA: Diagnosis not present

## 2014-12-27 DIAGNOSIS — D631 Anemia in chronic kidney disease: Secondary | ICD-10-CM | POA: Diagnosis not present

## 2014-12-27 DIAGNOSIS — N186 End stage renal disease: Secondary | ICD-10-CM | POA: Diagnosis not present

## 2015-01-03 ENCOUNTER — Encounter (HOSPITAL_COMMUNITY): Payer: Self-pay | Admitting: Emergency Medicine

## 2015-01-03 ENCOUNTER — Emergency Department (HOSPITAL_COMMUNITY)
Admission: EM | Admit: 2015-01-03 | Discharge: 2015-01-05 | Disposition: A | Discharge status: Other Acute Inpatient Hospital | Payer: Medicare Other | Attending: Emergency Medicine | Admitting: Emergency Medicine

## 2015-01-03 ENCOUNTER — Emergency Department (HOSPITAL_COMMUNITY): Payer: Medicare Other

## 2015-01-03 DIAGNOSIS — Z79899 Other long term (current) drug therapy: Secondary | ICD-10-CM | POA: Insufficient documentation

## 2015-01-03 DIAGNOSIS — K219 Gastro-esophageal reflux disease without esophagitis: Secondary | ICD-10-CM | POA: Insufficient documentation

## 2015-01-03 DIAGNOSIS — F329 Major depressive disorder, single episode, unspecified: Secondary | ICD-10-CM | POA: Insufficient documentation

## 2015-01-03 DIAGNOSIS — E119 Type 2 diabetes mellitus without complications: Secondary | ICD-10-CM | POA: Diagnosis not present

## 2015-01-03 DIAGNOSIS — R40241 Glasgow coma scale score 13-15: Secondary | ICD-10-CM | POA: Diagnosis not present

## 2015-01-03 DIAGNOSIS — N189 Chronic kidney disease, unspecified: Secondary | ICD-10-CM | POA: Diagnosis not present

## 2015-01-03 DIAGNOSIS — J449 Chronic obstructive pulmonary disease, unspecified: Secondary | ICD-10-CM | POA: Insufficient documentation

## 2015-01-03 DIAGNOSIS — F29 Unspecified psychosis not due to a substance or known physiological condition: Secondary | ICD-10-CM | POA: Diagnosis not present

## 2015-01-03 DIAGNOSIS — I129 Hypertensive chronic kidney disease with stage 1 through stage 4 chronic kidney disease, or unspecified chronic kidney disease: Secondary | ICD-10-CM | POA: Insufficient documentation

## 2015-01-03 DIAGNOSIS — Z720 Tobacco use: Secondary | ICD-10-CM | POA: Diagnosis not present

## 2015-01-03 DIAGNOSIS — R4182 Altered mental status, unspecified: Secondary | ICD-10-CM | POA: Diagnosis not present

## 2015-01-03 DIAGNOSIS — G40909 Epilepsy, unspecified, not intractable, without status epilepticus: Secondary | ICD-10-CM | POA: Diagnosis not present

## 2015-01-03 LAB — CBC
HCT: 34.8 % — ABNORMAL LOW (ref 36.0–46.0)
Hemoglobin: 10.9 g/dL — ABNORMAL LOW (ref 12.0–15.0)
MCH: 31.3 pg (ref 26.0–34.0)
MCHC: 31.3 g/dL (ref 30.0–36.0)
MCV: 100 fL (ref 78.0–100.0)
Platelets: 166 10*3/uL (ref 150–400)
RBC: 3.48 MIL/uL — ABNORMAL LOW (ref 3.87–5.11)
RDW: 19.2 % — ABNORMAL HIGH (ref 11.5–15.5)
WBC: 3.6 10*3/uL — ABNORMAL LOW (ref 4.0–10.5)

## 2015-01-03 LAB — RAPID URINE DRUG SCREEN, HOSP PERFORMED
Amphetamines: NOT DETECTED
Barbiturates: NOT DETECTED
Benzodiazepines: POSITIVE — AB
Cocaine: NOT DETECTED
Opiates: NOT DETECTED
Tetrahydrocannabinol: NOT DETECTED

## 2015-01-03 LAB — COMPREHENSIVE METABOLIC PANEL
ALT: 10 U/L — ABNORMAL LOW (ref 14–54)
AST: 14 U/L — ABNORMAL LOW (ref 15–41)
Albumin: 3 g/dL — ABNORMAL LOW (ref 3.5–5.0)
Alkaline Phosphatase: 107 U/L (ref 38–126)
Anion gap: 7 (ref 5–15)
BUN: 48 mg/dL — ABNORMAL HIGH (ref 6–20)
CO2: 19 mmol/L — ABNORMAL LOW (ref 22–32)
Calcium: 9.6 mg/dL (ref 8.9–10.3)
Chloride: 113 mmol/L — ABNORMAL HIGH (ref 101–111)
Creatinine, Ser: 4.23 mg/dL — ABNORMAL HIGH (ref 0.44–1.00)
GFR calc Af Amer: 13 mL/min — ABNORMAL LOW (ref >60)
GFR calc non Af Amer: 11 mL/min — ABNORMAL LOW (ref >60)
Glucose, Bld: 80 mg/dL (ref 65–99)
Potassium: 5 mmol/L (ref 3.5–5.1)
Sodium: 139 mmol/L (ref 135–145)
Total Bilirubin: 0.6 mg/dL (ref 0.3–1.2)
Total Protein: 6.8 g/dL (ref 6.5–8.1)

## 2015-01-03 LAB — I-STAT TROPONIN, ED: Troponin i, poc: 0.01 ng/mL (ref 0.00–0.08)

## 2015-01-03 LAB — ETHANOL: Alcohol, Ethyl (B): <5 mg/dL (ref <5)

## 2015-01-03 LAB — VALPROIC ACID LEVEL: Valproic Acid Lvl: <10 ug/mL — ABNORMAL LOW (ref 50.0–100.0)

## 2015-01-03 MED ORDER — ZIPRASIDONE MESYLATE 20 MG IM SOLR
10.0000 mg | Freq: Once | INTRAMUSCULAR | Status: AC
  Administered 2015-01-03: 10 mg via INTRAMUSCULAR
  Filled 2015-01-03: qty 20

## 2015-01-03 MED ORDER — AMMONIA AROMATIC IN INHA
RESPIRATORY_TRACT | Status: AC
  Filled 2015-01-03: qty 10

## 2015-01-03 MED ORDER — LORAZEPAM 2 MG/ML IJ SOLN
2.0000 mg | Freq: Once | INTRAMUSCULAR | Status: AC
  Administered 2015-01-03: 2 mg via INTRAMUSCULAR
  Filled 2015-01-03: qty 1

## 2015-01-03 NOTE — ED Notes (Signed)
This RN asked the pt if she could provide a urine specimen and the pt said nothing.

## 2015-01-03 NOTE — ED Notes (Signed)
Belongings placed in utility room, one blouse, pink bra, one underwear, one pair paints. No shoes, cellphone, or other valuables present at bedside or in utility room. Pt in burgundy scrubs.

## 2015-01-03 NOTE — ED Notes (Signed)
Pt medially cleared to move to POD C per [employer]. Discussed 24 hour hold plan with Dr. Ralene Bathe, plan to monitor pt and reassess pt in AM to determine if regular IVC papers need to be served.

## 2015-01-03 NOTE — BHH Counselor (Signed)
Maxine Glenn, RN to have cart ready for TTS. Pt was incoherent and was unable to complete assessment at [7:56]. Spoke with extender Little, MD for update on pt., and to have the TTS order removed from system so that another assessment can be performed at later time when patient is coherent and able to participate. Lowella Fairy, RN at [facility] ED for update on patient.  Jhace Fennell K. Kenton Kingfisher, [place]  Counselor 01/03/2015 8:16 PM

## 2015-01-03 NOTE — ED Notes (Signed)
Pt refuses to speak much.

## 2015-01-03 NOTE — ED Notes (Signed)
Pt given [place] sandwich and ginger ale. Pt stated "where's my daughter?" pt informed that the PA is trying to contact her daughter. Pt is stable, VSS, no acute distress.

## 2015-01-03 NOTE — ED Notes (Signed)
TTS at bedside. 

## 2015-01-03 NOTE — BH Assessment (Signed)
[facility] Assessment Progress Note Spoke with Montine Circle, PA, took history of patient. He states that pt is agitated and not ready to be seen at this time. TTS consult will be taken out and they will put it back in when pt is ready to talk.

## 2015-01-03 NOTE — ED Provider Notes (Signed)
CSN: [id number]     Arrival date & time 01/03/15  1309 History   First MD Initiated Contact with Patient 01/03/15 1503     Chief Complaint  Patient presents with  . Manic Behavior     (Consider location/radiation/quality/duration/timing/severity/associated sxs/prior Treatment) HPI Comments: Patient presents to the ED with a chief complaint of noncompliance.  Patient reportedly brought in by daughter because the patient has refused to go to dialysis.  Last HD was a week ago.  Patient refusing to communicate.  Level 5 caveat applies 2/2 patient not communicating.  The history is provided by the patient. No language interpreter was used.    Past Medical History  Diagnosis Date  . Mental disorder   . Depression   . Hypertension   . Overdose   . Tobacco use disorder 11/13/2012  . Complication of anesthesia     difficulty going to sleep  . Chronic kidney disease     06/11/13- not on dialysis  . Shortness of breath     lying down flat  . PTSD (post-traumatic stress disorder)   . Asthma   . COPD (chronic obstructive pulmonary disease)   . Heart murmur   . GERD (gastroesophageal reflux disease)   . Seizures     "passsed out"  . History of blood transfusion   . Diabetes mellitus without complication     denies   Past Surgical History  Procedure Laterality Date  . Right knee replacement      she says it was last year.  . Esophagogastroduodenoscopy Left 11/14/2012    Procedure: ESOPHAGOGASTRODUODENOSCOPY (EGD);  Surgeon: [name], MD;  Location: [facility];  Service: Endoscopy;  Laterality: Left;  . Joint replacement Right     knee  . Parathyroidectomy    . Av fistula placement Right 06/12/2013    Procedure: ARTERIOVENOUS (AV) FISTULA CREATION; RIGHT  BASILIC VEIN TRANSPOSITION with Intraoperative ultrasound;  Surgeon: Mal [name], MD;  Location: [facility] OR;  Service: Vascular;  Laterality: Right;   Family History  Problem Relation Age of Onset  . Diabetes Mother   .  Hyperlipidemia Mother   . Hypertension Mother   . Diabetes Father   . Hypertension Father   . Hyperlipidemia Father    Social History  Substance Use Topics  . Smoking status: Current Every Day Smoker -- 2.00 packs/day for 40 years    Types: Cigarettes, Cigars  . Smokeless tobacco: Never Used  . Alcohol Use: No     Comment: none in over a month per daughter   OB History    No data available     Review of Systems  Unable to perform ROS: Other      Allergies  Codeine sulfate; Haldol; Risperidone and related; Depakote; Gabapentin; Invega; Trazodone and nefazodone; and Vistaril  Home Medications   Prior to Admission medications   Medication Sig Start Date End Date Taking? Authorizing Provider  acetaminophen (TYLENOL) 325 MG tablet Take 2 tablets (650 mg total) by mouth every 4 (four) hours as needed for fever, headache or mild pain. 05/20/14   Bynum Bellows, MD  albuterol (PROVENTIL) (2.5 MG/3ML) 0.083% nebulizer solution Take 3 mLs (2.5 mg total) by nebulization every 4 (four) hours as needed for wheezing or shortness of breath. 04/03/14   Encarnacion Slates, NP  bisacodyl (DULCOLAX) 5 MG EC tablet Take 1 tablet (5 mg total) by mouth daily as needed for moderate constipation. Patient not taking: Reported on 10/13/2014 05/20/14   Bynum Bellows, MD  Darbepoetin Alfa (ARANESP) 60 MCG/0.3ML SOSY injection Inject 0.3 mLs (60 mcg total) into the vein every Thursday with hemodialysis. 05/20/14   Bynum Bellows, MD  divalproex (DEPAKOTE ER) 500 MG 24 hr tablet Take 500-1,000 mg by mouth 2 (two) times daily. Take 500 mg in the morning and 1000 mg in the evening. 09/12/14   Historical Provider, MD  divalproex (DEPAKOTE) 250 MG DR tablet Take 3 tablets (750 mg total) by mouth daily. Patient not taking: Reported on 10/13/2014 08/01/14   Delfina Redwood, MD  docusate sodium 100 MG CAPS Take 100 mg by mouth 2 (two) times daily. Patient not taking: Reported on 10/13/2014 05/20/14   Bynum Bellows, MD   doxepin (SINEQUAN) 25 MG capsule Take 25 mg by mouth at bedtime. 09/12/14   Historical Provider, MD  ferric gluconate 250 mg in sodium chloride 0.9 % 100 mL Inject 250 mg into the vein every Tuesday, Thursday, Saturday, and Sunday. 05/20/14   Bynum Bellows, MD  Fluticasone-Salmeterol (ADVAIR) 100-50 MCG/DOSE AEPB Inhale 1 puff into the lungs 2 (two) times daily. For asthma Patient not taking: Reported on 10/13/2014 04/03/14   Encarnacion Slates, NP  furosemide (LASIX) 40 MG tablet Take 40 mg by mouth 2 (two) times daily.    Historical Provider, MD  ibuprofen (ADVIL,MOTRIN) 200 MG tablet Take 600 mg by mouth every 6 (six) hours as needed for mild pain, moderate pain or cramping.    Historical Provider, MD  LORazepam (ATIVAN) 1 MG tablet Take 1 mg by mouth 3 (three) times a week. Take on Tuesday, Thursday, and Saturday 09/23/14   Historical Provider, MD  metoprolol tartrate (LOPRESSOR) 25 MG tablet Take 0.5 tablets (12.5 mg total) by mouth 2 (two) times daily. For HTN 05/20/14   Bynum Bellows, MD  multivitamin (RENA-VIT) TABS tablet Take 1 tablet by mouth at bedtime. 05/20/14   Srikar Janna Arch, MD  nicotine (NICODERM CQ - DOSED IN MG/24 HOURS) 21 mg/24hr patch Place 1 patch (21 mg total) onto the skin daily. Patient not taking: Reported on 10/13/2014 05/20/14   Bynum Bellows, MD  Nutritional Supplements (FEEDING SUPPLEMENT, NEPRO CARB STEADY,) LIQD Take 237 mLs by mouth as needed (missed meal during dialysis.). Patient not taking: Reported on 10/13/2014 05/20/14   Bynum Bellows, MD  pantoprazole (PROTONIX) 40 MG tablet Take 1 tablet (40 mg total) by mouth daily. Patient not taking: Reported on 10/13/2014 08/01/14   Delfina Redwood, MD  PROAIR HFA 108 (90 BASE) MCG/ACT inhaler Inhale 1 puff into the lungs every 4 (four) hours as needed. wheezing 07/19/14   Historical Provider, MD  SENSIPAR 30 MG tablet Take 30 mg by mouth daily. 09/12/14   Historical Provider, MD  simvastatin (ZOCOR) 10 MG tablet Take 1 tablet (10 mg  total) by mouth at bedtime. For high cholesterol/fats 04/03/14   Encarnacion Slates, NP  ziprasidone (GEODON) 60 MG capsule Take 1 capsule (60 mg total) by mouth 2 (two) times daily with a meal. For mood control Patient not taking: Reported on 10/13/2014 08/01/14   Delfina Redwood, MD  ziprasidone (GEODON) 80 MG capsule Take 80 mg by mouth 2 (two) times daily. 09/12/14   Historical Provider, MD   BP 136/88 mmHg  Pulse 89  Temp(Src) 98.3 F (36.8 C) (Oral)  Resp 18  SpO2 98%  LMP 05/09/2008 Physical Exam  Constitutional: She is oriented to person, place, and time. She appears well-developed and well-nourished.  HENT:  Head: Normocephalic and  atraumatic.  Eyes: Conjunctivae and EOM are normal. Pupils are equal, round, and reactive to light.  Neck: Normal range of motion. Neck supple.  Cardiovascular: Normal rate and regular rhythm.  Exam reveals no gallop and no friction rub.   No murmur heard. Pulmonary/Chest: Effort normal and breath sounds normal. No respiratory distress. She has no wheezes. She has no rales. She exhibits no tenderness.  Abdominal: Soft. Bowel sounds are normal. She exhibits no distension and no mass. There is no tenderness. There is no rebound and no guarding.  Musculoskeletal: Normal range of motion. She exhibits no edema or tenderness.  Neurological: She is alert and oriented to person, place, and time.  Skin: Skin is warm and dry.  Psychiatric: She has a normal mood and affect. Her behavior is normal. Judgment and thought content normal.  Nursing note and vitals reviewed.   ED Course  Procedures (including critical care time) Results for orders placed or performed during the hospital encounter of 01/03/15  Comprehensive metabolic panel  Result Value Ref Range   Sodium 139 135 - 145 mmol/L   Potassium 5.0 3.5 - 5.1 mmol/L   Chloride 113 (H) 101 - 111 mmol/L   CO2 19 (L) 22 - 32 mmol/L   Glucose, Bld 80 65 - 99 mg/dL   BUN 48 (H) 6 - 20 mg/dL   Creatinine, Ser  4.23 (H) 0.44 - 1.00 mg/dL   Calcium 9.6 8.9 - 10.3 mg/dL   Total Protein 6.8 6.5 - 8.1 g/dL   Albumin 3.0 (L) 3.5 - 5.0 g/dL   AST 14 (L) 15 - 41 U/L   ALT 10 (L) 14 - 54 U/L   Alkaline Phosphatase 107 38 - 126 U/L   Total Bilirubin 0.6 0.3 - 1.2 mg/dL   GFR calc non Af Amer 11 (L) >60 mL/min   GFR calc Af Amer 13 (L) >60 mL/min   Anion gap 7 5 - 15  CBC  Result Value Ref Range   WBC 3.6 (L) 4.0 - 10.5 K/uL   RBC 3.48 (L) 3.87 - 5.11 MIL/uL   Hemoglobin 10.9 (L) 12.0 - 15.0 g/dL   HCT 34.8 (L) 36.0 - 46.0 %   MCV 100.0 78.0 - 100.0 fL   MCH 31.3 26.0 - 34.0 pg   MCHC 31.3 30.0 - 36.0 g/dL   RDW 19.2 (H) 11.5 - 15.5 %   Platelets 166 150 - 400 K/uL  Urine rapid drug screen (hosp performed)  Result Value Ref Range   Opiates NONE DETECTED NONE DETECTED   Cocaine NONE DETECTED NONE DETECTED   Benzodiazepines POSITIVE (A) NONE DETECTED   Amphetamines NONE DETECTED NONE DETECTED   Tetrahydrocannabinol NONE DETECTED NONE DETECTED   Barbiturates NONE DETECTED NONE DETECTED  Ethanol  Result Value Ref Range   Alcohol, Ethyl (B) <5 <5 mg/dL  Valproic acid level  Result Value Ref Range   Valproic Acid Lvl <10 (L) 50.0 - 100.0 ug/mL  I-Stat Troponin, ED (not at [facility])  Result Value Ref Range   Troponin i, poc 0.01 0.00 - 0.08 ng/mL   Comment 3           Ct Head Wo Contrast  01/03/2015   CLINICAL DATA:  Altered mental status. Patient is uncooperative and refusing medications and dialysis.  EXAM: CT HEAD WITHOUT CONTRAST  TECHNIQUE: Contiguous axial images were obtained from the base of the skull through the vertex without intravenous contrast.  COMPARISON:  MRI brain 05/14/2014.  CT head 05/13/2014.  FINDINGS:  Ventricles and sulci appear symmetrical. No mass effect or midline shift. No abnormal extra-axial fluid collections. Gray-white matter junctions are distinct. Basal cisterns are not effaced. No evidence of acute intracranial hemorrhage. No depressed skull fractures. Retention cyst in  the left maxillary antrum. Paranasal sinuses are otherwise clear. Mastoid air cells are not opacified. Vascular calcifications.  IMPRESSION: No acute intracranial abnormalities.   Electronically Signed   By: Lucienne Capers M.D.   On: 01/03/2015 22:13      MDM   Final diagnoses:  None      Patient seen by and discussed with Dr. Ralene Bathe, who agrees that patient does not need emergent dialysis.  Patient essentially abandoned by family.  Will have case manager contact adult protective services.  Patient has become combative and exhibits a danger to herself and to others.  Will give 10mg  IM Geodon per Dr. Ralene Bathe' instructions.    Patient placed under IVC for danger to self and others.  Labs are reassuring.  No need for emergent dialysis, but patient does need to by evaluated by TTS/psych.  Patient seen by and discussed with Dr. Ralene Bathe, who agrees with plan.  Montine Circle, PA-C 01/03/15 2306  Quintella Reichert, MD 01/04/15 [phone]

## 2015-01-03 NOTE — ED Notes (Signed)
Pt now awake. I asked the pt her name and she stated it, I asked her where she is, and she stated "I need something to eat"

## 2015-01-03 NOTE — ED Notes (Signed)
Pt called out and stated "I want graham crackers" I brought the pt graham crackers and she stated "I don't need to be here" and then went silent.

## 2015-01-03 NOTE — ED Notes (Signed)
In room with 2 security officers to administer ativan.

## 2015-01-03 NOTE — ED Notes (Signed)
Patient has no visitors at this time. Pt's emergency contact information given to Rob PA, emergency contact is the patient's daughter.

## 2015-01-03 NOTE — ED Notes (Signed)
Pt called out asking for water, pt given water.

## 2015-01-03 NOTE — ED Notes (Addendum)
24-hour commitment IVC paperwork completed by Dr Ralene Bathe. Original placed in folder for Magistrate. Copy sent to Medical Records and copy placed in pt's bin at nurses' desk. If pt is released w/in 24 hours, "Commitment Change" paperwork will need to be completed. If pt remains in ED and NEEDS TO REMAIN IVC'D, Affidavit and Exam and Recommendation paperwork will need to be completed and faxed to [place] so may be served.

## 2015-01-03 NOTE — ED Notes (Signed)
Patient given warm blanket.

## 2015-01-03 NOTE — ED Notes (Signed)
PA attempted to call pt's daughter and was unable to reach her at home or at work.

## 2015-01-03 NOTE — ED Notes (Signed)
Per EMS: Pt comes from home, daughter c/o pt being uncooperative and refuses to take meds or go to dialysis. Pt has dialysis T-R-Sat. Pt last had dialysis 12/27/14. Pt has right arm  A/V fistula. Pt denies pain. Pt A&O to self. Pt given 5mg  IM Versed prior to coming to hospital. VSS.

## 2015-01-03 NOTE — ED Notes (Signed)
Wanded by security at this time

## 2015-01-03 NOTE — ED Notes (Addendum)
Pt ripped off cardiac leads, bp cuff and pulse ox and took out IV, trying to leave room, out in the hallways wondering around, this RN tried to get pt back to her room and pt refused to go back to her room. Security, Agricultural consultant, MD, and PA notified. Security and this RN with patient to escort her back to her room. MD stated that pt will be IVC. Pt combative and stating "I need to leave, I see what y'all are trying to do to me"

## 2015-01-04 LAB — I-STAT CHEM 8, ED
BUN: 57 mg/dL — ABNORMAL HIGH (ref 6–20)
Calcium, Ion: 1.29 mmol/L — ABNORMAL HIGH (ref 1.12–1.23)
Chloride: 114 mmol/L — ABNORMAL HIGH (ref 101–111)
Creatinine, Ser: 3.9 mg/dL — ABNORMAL HIGH (ref 0.44–1.00)
Glucose, Bld: 108 mg/dL — ABNORMAL HIGH (ref 65–99)
HCT: 41 % (ref 36.0–46.0)
Hemoglobin: 13.9 g/dL (ref 12.0–15.0)
Potassium: 5 mmol/L (ref 3.5–5.1)
Sodium: 140 mmol/L (ref 135–145)
TCO2: 20 mmol/L (ref 0–100)

## 2015-01-04 MED ORDER — LORAZEPAM 1 MG PO TABS
0.5000 mg | ORAL_TABLET | Freq: Every day | ORAL | Status: DC | PRN
  Administered 2015-01-04: 0.5 mg via ORAL
  Filled 2015-01-04: qty 1

## 2015-01-04 MED ORDER — ZIPRASIDONE HCL 20 MG PO CAPS
60.0000 mg | ORAL_CAPSULE | Freq: Two times a day (BID) | ORAL | Status: DC
  Administered 2015-01-04 – 2015-01-05 (×2): 60 mg via ORAL
  Filled 2015-01-04 (×2): qty 3

## 2015-01-04 MED ORDER — METOPROLOL TARTRATE 25 MG PO TABS
12.5000 mg | ORAL_TABLET | Freq: Two times a day (BID) | ORAL | Status: DC
  Administered 2015-01-04 – 2015-01-05 (×3): 12.5 mg via ORAL
  Filled 2015-01-04 (×3): qty 1

## 2015-01-04 MED ORDER — ACETAMINOPHEN 325 MG PO TABS
650.0000 mg | ORAL_TABLET | ORAL | Status: DC | PRN
  Administered 2015-01-04 – 2015-01-05 (×2): 650 mg via ORAL
  Filled 2015-01-04 (×2): qty 2

## 2015-01-04 MED ORDER — DIVALPROEX SODIUM ER 500 MG PO TB24
750.0000 mg | ORAL_TABLET | Freq: Every day | ORAL | Status: DC
  Administered 2015-01-04 – 2015-01-05 (×2): 750 mg via ORAL
  Filled 2015-01-04 (×2): qty 1

## 2015-01-04 MED ORDER — ZIPRASIDONE MESYLATE 20 MG IM SOLR
10.0000 mg | Freq: Once | INTRAMUSCULAR | Status: AC
  Administered 2015-01-04: 10 mg via INTRAMUSCULAR
  Filled 2015-01-04: qty 20

## 2015-01-04 MED ORDER — NICOTINE 21 MG/24HR TD PT24
21.0000 mg | MEDICATED_PATCH | Freq: Every day | TRANSDERMAL | Status: DC
  Administered 2015-01-04 – 2015-01-05 (×2): 21 mg via TRANSDERMAL
  Filled 2015-01-04 (×2): qty 1

## 2015-01-04 MED ORDER — STERILE WATER FOR INJECTION IJ SOLN
2.0000 mL | Freq: Once | INTRAMUSCULAR | Status: AC
  Administered 2015-01-04: 1.2 mL via INTRAMUSCULAR

## 2015-01-04 MED ORDER — DOXEPIN HCL 25 MG PO CAPS
25.0000 mg | ORAL_CAPSULE | Freq: Every day | ORAL | Status: DC
  Administered 2015-01-04: 25 mg via ORAL
  Filled 2015-01-04 (×2): qty 1

## 2015-01-04 MED ORDER — LORAZEPAM 2 MG/ML IJ SOLN
2.0000 mg | Freq: Once | INTRAMUSCULAR | Status: AC
  Administered 2015-01-04: 2 mg via INTRAMUSCULAR
  Filled 2015-01-04: qty 1

## 2015-01-04 MED ORDER — STERILE WATER FOR INJECTION IJ SOLN
INTRAMUSCULAR | Status: AC
  Administered 2015-01-04: 1.2 mL via INTRAMUSCULAR
  Filled 2015-01-04: qty 10

## 2015-01-04 MED ORDER — CINACALCET HCL 30 MG PO TABS
60.0000 mg | ORAL_TABLET | Freq: Every day | ORAL | Status: DC
  Administered 2015-01-04 – 2015-01-05 (×2): 60 mg via ORAL
  Filled 2015-01-04 (×2): qty 2

## 2015-01-04 NOTE — ED Notes (Signed)
Pt ambulated to bathroom and made phone call.

## 2015-01-04 NOTE — ED Notes (Signed)
O2 applied to pt via n/c at 2L/min. States chest pain is relieved at this time. Pt lying on bed on left side.

## 2015-01-04 NOTE — ED Notes (Signed)
Dr Irena Reichmann aware pt c/o chest pain and EKG being performed. Dr Tamera Punt advised will need to monitor pt's labs for dialysis d/t nephrologist advised dialysis will not be performed unless pt is admitted to hospital. Per Arby Barrette, [facility], placement is being sought for psych facility that will also be able to perform dialysis incl [facility].

## 2015-01-04 NOTE — ED Notes (Signed)
Patient back out of room again attempting to leave; will not go back in room; security and [employer] at room

## 2015-01-04 NOTE — Progress Notes (Signed)
Disposition CSW completed patient referrals to the following inpatient psych facilities:  [facility]- May have beds tomorrow [place]  CSW will continue to assist as needed for placement.   [place] Disposition CSW [phone]

## 2015-01-04 NOTE — ED Notes (Signed)
Pt moved from chair to lying in bed on right side, alert. Pt would not roll over so may administer Ativan in right arm. Pt pointed to left arm for it to be administered to left arm - states did not want in leg/hip.

## 2015-01-04 NOTE — ED Notes (Addendum)
Dr Tamera Punt aware pharmacy tech called daughter to verify meds. Advised last day she took meds was 8/22 or 12/30/14.

## 2015-01-04 NOTE — ED Notes (Addendum)
Paper, marker, Bible and extra pillow given to pt as requested. Pt lying in bed writing.

## 2015-01-04 NOTE — ED Notes (Addendum)
Pt noted to be irritable and ambulating in hallway - refusing to go back to room. [employer] Officer assisted in talking w/pt.

## 2015-01-04 NOTE — ED Notes (Signed)
Magistrate advised has performed IVC paperwork - waiting on them to be picked up so may be served.

## 2015-01-04 NOTE — ED Notes (Signed)
TTS attempting to assess patient at this time.  Refusing to answer questions.

## 2015-01-04 NOTE — ED Notes (Signed)
[place], [facility], attempting to perform TTS w/pt. Pt answering very few questions. RN encouraged pt to speak w/her d/t pt had asked to speak w/someone prior to TTS.

## 2015-01-04 NOTE — ED Notes (Signed)
Patient resting on stretcher; cup of decaf coffee given; sitter at bedside

## 2015-01-04 NOTE — ED Notes (Signed)
Patient eating lunch tray; sitter at bedside

## 2015-01-04 NOTE — ED Notes (Signed)
Myself, security and [employer] assisted Kim, Phlebotomist with drawing labs from patient; patient now still sitting on stretcher; sitter at bedside

## 2015-01-04 NOTE — ED Notes (Signed)
Called into room by patient.  States i can't breathe.  Checked vitals at this time.  No SOB noted.  Breathing easy and non-labored with O2 sat noted to be 96% on RA.  Reassured patient that vitals are good.  To continue to monitor.

## 2015-01-04 NOTE — ED Notes (Signed)
Asking multiple questions about when she can go home.  Explained how important it is to answer questions when TTS calls back.  Continues to ask questions then stare at you with no answer when spoken to.

## 2015-01-04 NOTE — ED Provider Notes (Addendum)
Patient starting to get agitated this morning. She's attempting to leave and wandering the halls. She was given an injection of Geodon. Her EKG was reviewed and there is no QT prolongation. Given that she is a dialysis patient, we will recheck an i-STAT. The daughter was able to be contacted this morning in the pharmacist has verified her medications so I will restart her home medications. Per her daughter, patient has not received dialysis since August 20. IVC papers were redone as well.  Spoke with Dr. Jonnie Finner with nephrology about getting pt on list at some point for dialysis as pt may be here awhile before placement, but Dr. Jonnie Finner says that they are unable to do routine dialysis unless pt is admitted.  She currently does not need emergent dialysis or admission.  Will have to follow and recheck daily regarding this.  13:38  Pt reports brief episode of CP.  EKG unchanged.  Malvin Johns, MD 01/04/15 G. L. Garcia, MD 01/04/15 [place], MD 01/04/15 1339

## 2015-01-04 NOTE — ED Notes (Addendum)
Attempting to reach pt's daughter to ask about O2 - no answer. [phone]

## 2015-01-04 NOTE — ED Notes (Addendum)
Pt sat on bed for injection. Security and [employer] at bedside d/t pt had refused to go back into room - pt attempting to leave. Dr Tamera Punt assessed pt and order received for Geodon. Dr Tamera Punt to complete next set of IVC papers d/t has 24-hour IVC currently.

## 2015-01-04 NOTE — ED Notes (Signed)
Pt on phone at nurses' desk trying to call her daughter.

## 2015-01-04 NOTE — ED Notes (Signed)
Pharmacy tech in speaking with patient

## 2015-01-04 NOTE — ED Notes (Addendum)
Assisted patient with shower per her request.

## 2015-01-04 NOTE — ED Notes (Signed)
Dinner tray arrived, patient eating; sitter at bedside

## 2015-01-04 NOTE — ED Notes (Signed)
Pt was given apple sauce, graham crackers and a coke with ice.

## 2015-01-04 NOTE — ED Notes (Signed)
Pt c/o chest pain. EKG ordered as per protocol. States she is on O2 at home at 3L/min.

## 2015-01-04 NOTE — ED Notes (Signed)
Pt ambulatory to nurses' desk - attempting to call daughter. Pt asking for her to bring her something to eat. Advised pt no outside food may be brought in - voiced understanding after cursing. Pt returned to her room. Advised pt her dinner tray will be here soon.

## 2015-01-04 NOTE — BHH Counselor (Signed)
Contacted [place], Shanon Brow, RN to order cart for tele-psych for assessment at 4:13 am this date. Patient was uncooperative this writer was unable to complete the assessment. Notified [place], Levonne Spiller of patient status.  Azariyah Luhrs K. Kenton Kingfisher, [place]  Counselor 01/04/2015 4:40 AM

## 2015-01-04 NOTE — ED Notes (Signed)
Pt sat in room for approx 20 minutes then ambulated to nurses' desk attempting to call her daughter. Pt then ambulated in hallway refusing to go back to room. Pt sat in chair outside of room. Security and [employer] w/pt. Pt now sitting in chair in room - continues to come to hallway then returns to room. Pt noted to be talking - stating "this is no hospital", "I don't trust you all". Pt noted w/no teeth - states "I'm the only one here with no teeth".

## 2015-01-04 NOTE — ED Notes (Signed)
IVC PAPERS SERVED

## 2015-01-04 NOTE — ED Notes (Signed)
Oatmeal warmed for pt - took a few bites then laid down on bed on her right side.

## 2015-01-04 NOTE — ED Notes (Signed)
Awake wanting to know why she is here and not getting dialysis.  Explained that she was manic when she arrived and that [facility] would be attempting to reassess her now that she is awake.  Notified Ford from [employer].

## 2015-01-04 NOTE — ED Notes (Signed)
Pt refused to go back into room after she had been in bathroom. Sat in chair outside of room. RN, NT and sitter encouraged pt to return to room. Pt ambulated back into room w/RN and NT. Pt sat in chair in room. Pt noted to be tearful. Stating she wants to go home, wants to take her meds and wants cranberry juice. Juice given, advised pt she needs to talk w/TTS and EDP so she may receive a full assessment. Asked pt if she will be able to tell pharmacy tech which meds she takes or pharmacy - pt would not reply. Pt will not answer questions - will only talk in short sentences.

## 2015-01-04 NOTE — ED Notes (Signed)
Faxed IVC paperwork to Magistrate.  

## 2015-01-04 NOTE — ED Notes (Signed)
Patient resting on stretcher, appears to be asleep; patient on oxygen  (2L); sitter at bedside

## 2015-01-04 NOTE — ED Notes (Signed)
Patient went to the bathroom with sitter at side; patient walking back to room and sits in chair outside of room; patient starting to get aggitated a little; myself and Becky, RN guide patient back into room; patient sits in chair in room and starts to cry; went and got patient some cranberry juice to drink and patient sitting in chair speaking with Jacqlyn Larsen, Therapist, sports; sitter at bedside

## 2015-01-04 NOTE — ED Notes (Signed)
IVC PAPERS - ORIGINAL PLACED IN FOLDER FOR MAGISTRATE, COPY FAXED TO [facility] AND COPY SENT TO MEDICAL RECORDS.

## 2015-01-04 NOTE — ED Notes (Signed)
Patient is speaking with a counselor from [place] via telepsych; sitter at bedside

## 2015-01-04 NOTE — ED Notes (Signed)
Snacks given with a cup of cranberry juice; sitter at bedside
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
## 2015-01-04 NOTE — ED Notes (Signed)
Lunch order taken and sent; sitter at bedside

## 2015-01-04 NOTE — ED Notes (Signed)
Patient given a cup of ice water, patient sitting up on stretcher watching television; sitter at bedside

## 2015-01-04 NOTE — ED Notes (Addendum)
Tylenol given as requested for c/o chronic abd pain. Pt apologized for behavior she displayed earlier today. Requested to be given pen and paper so she can write out her story.

## 2015-01-04 NOTE — ED Notes (Signed)
Dinner order taken and sent; sitter at bedside

## 2015-01-05 ENCOUNTER — Inpatient Hospital Stay
Admission: EM | Admit: 2015-01-05 | Discharge: 2015-01-13 | DRG: 885 | Disposition: A | Discharge status: Home / Self Care | Payer: 59 | Source: Other Acute Inpatient Hospital | Attending: Psychiatry | Admitting: Psychiatry

## 2015-01-05 DIAGNOSIS — Z915 Personal history of self-harm: Secondary | ICD-10-CM

## 2015-01-05 DIAGNOSIS — N186 End stage renal disease: Secondary | ICD-10-CM | POA: Diagnosis present

## 2015-01-05 DIAGNOSIS — D631 Anemia in chronic kidney disease: Secondary | ICD-10-CM | POA: Diagnosis present

## 2015-01-05 DIAGNOSIS — Z992 Dependence on renal dialysis: Secondary | ICD-10-CM | POA: Diagnosis not present

## 2015-01-05 DIAGNOSIS — R011 Cardiac murmur, unspecified: Secondary | ICD-10-CM | POA: Diagnosis present

## 2015-01-05 DIAGNOSIS — J45909 Unspecified asthma, uncomplicated: Secondary | ICD-10-CM | POA: Diagnosis present

## 2015-01-05 DIAGNOSIS — I120 Hypertensive chronic kidney disease with stage 5 chronic kidney disease or end stage renal disease: Secondary | ICD-10-CM | POA: Diagnosis present

## 2015-01-05 DIAGNOSIS — Z886 Allergy status to analgesic agent status: Secondary | ICD-10-CM | POA: Diagnosis not present

## 2015-01-05 DIAGNOSIS — Z833 Family history of diabetes mellitus: Secondary | ICD-10-CM | POA: Diagnosis not present

## 2015-01-05 DIAGNOSIS — G4700 Insomnia, unspecified: Secondary | ICD-10-CM | POA: Diagnosis present

## 2015-01-05 DIAGNOSIS — G40909 Epilepsy, unspecified, not intractable, without status epilepticus: Secondary | ICD-10-CM | POA: Diagnosis not present

## 2015-01-05 DIAGNOSIS — Z888 Allergy status to other drugs, medicaments and biological substances status: Secondary | ICD-10-CM

## 2015-01-05 DIAGNOSIS — J449 Chronic obstructive pulmonary disease, unspecified: Secondary | ICD-10-CM | POA: Diagnosis not present

## 2015-01-05 DIAGNOSIS — I129 Hypertensive chronic kidney disease with stage 1 through stage 4 chronic kidney disease, or unspecified chronic kidney disease: Secondary | ICD-10-CM | POA: Diagnosis not present

## 2015-01-05 DIAGNOSIS — Z720 Tobacco use: Secondary | ICD-10-CM | POA: Diagnosis not present

## 2015-01-05 DIAGNOSIS — Z9114 Patient's other noncompliance with medication regimen: Secondary | ICD-10-CM | POA: Diagnosis present

## 2015-01-05 DIAGNOSIS — N2581 Secondary hyperparathyroidism of renal origin: Secondary | ICD-10-CM | POA: Diagnosis not present

## 2015-01-05 DIAGNOSIS — N189 Chronic kidney disease, unspecified: Secondary | ICD-10-CM | POA: Diagnosis not present

## 2015-01-05 DIAGNOSIS — Z96651 Presence of right artificial knee joint: Secondary | ICD-10-CM | POA: Diagnosis present

## 2015-01-05 DIAGNOSIS — F17200 Nicotine dependence, unspecified, uncomplicated: Secondary | ICD-10-CM | POA: Diagnosis present

## 2015-01-05 DIAGNOSIS — F3163 Bipolar disorder, current episode mixed, severe, without psychotic features: Secondary | ICD-10-CM | POA: Diagnosis present

## 2015-01-05 DIAGNOSIS — F329 Major depressive disorder, single episode, unspecified: Secondary | ICD-10-CM | POA: Diagnosis not present

## 2015-01-05 DIAGNOSIS — R443 Hallucinations, unspecified: Secondary | ICD-10-CM | POA: Diagnosis present

## 2015-01-05 DIAGNOSIS — Z8249 Family history of ischemic heart disease and other diseases of the circulatory system: Secondary | ICD-10-CM | POA: Diagnosis not present

## 2015-01-05 DIAGNOSIS — I10 Essential (primary) hypertension: Secondary | ICD-10-CM | POA: Diagnosis not present

## 2015-01-05 DIAGNOSIS — F17210 Nicotine dependence, cigarettes, uncomplicated: Secondary | ICD-10-CM | POA: Diagnosis present

## 2015-01-05 DIAGNOSIS — E119 Type 2 diabetes mellitus without complications: Secondary | ICD-10-CM | POA: Diagnosis present

## 2015-01-05 DIAGNOSIS — F29 Unspecified psychosis not due to a substance or known physiological condition: Secondary | ICD-10-CM | POA: Diagnosis not present

## 2015-01-05 DIAGNOSIS — K219 Gastro-esophageal reflux disease without esophagitis: Secondary | ICD-10-CM | POA: Diagnosis present

## 2015-01-05 DIAGNOSIS — Z79899 Other long term (current) drug therapy: Secondary | ICD-10-CM | POA: Diagnosis not present

## 2015-01-05 DIAGNOSIS — F3164 Bipolar disorder, current episode mixed, severe, with psychotic features: Secondary | ICD-10-CM | POA: Diagnosis not present

## 2015-01-05 DIAGNOSIS — Z9119 Patient's noncompliance with other medical treatment and regimen: Secondary | ICD-10-CM | POA: Diagnosis present

## 2015-01-05 LAB — I-STAT CHEM 8, ED
BUN: 46 mg/dL — ABNORMAL HIGH (ref 6–20)
Calcium, Ion: 1.32 mmol/L — ABNORMAL HIGH (ref 1.12–1.23)
Chloride: 112 mmol/L — ABNORMAL HIGH (ref 101–111)
Creatinine, Ser: 3.6 mg/dL — ABNORMAL HIGH (ref 0.44–1.00)
Glucose, Bld: 93 mg/dL (ref 65–99)
HCT: 36 % (ref 36.0–46.0)
Hemoglobin: 12.2 g/dL (ref 12.0–15.0)
Potassium: 4.8 mmol/L (ref 3.5–5.1)
Sodium: 142 mmol/L (ref 135–145)
TCO2: 21 mmol/L (ref 0–100)

## 2015-01-05 MED ORDER — DOXEPIN HCL 25 MG PO CAPS
25.0000 mg | ORAL_CAPSULE | Freq: Every day | ORAL | Status: DC
  Administered 2015-01-05 – 2015-01-06 (×2): 25 mg via ORAL
  Filled 2015-01-05 (×3): qty 1

## 2015-01-05 MED ORDER — METOPROLOL TARTRATE 25 MG PO TABS
12.5000 mg | ORAL_TABLET | Freq: Two times a day (BID) | ORAL | Status: DC
  Administered 2015-01-05 – 2015-01-10 (×9): 12.5 mg via ORAL
  Administered 2015-01-11: 25 mg via ORAL
  Administered 2015-01-11 – 2015-01-13 (×5): 12.5 mg via ORAL
  Filled 2015-01-05 (×2): qty 1
  Filled 2015-01-05: qty 2
  Filled 2015-01-05 (×10): qty 1
  Filled 2015-01-05: qty 2
  Filled 2015-01-05 (×2): qty 1

## 2015-01-05 MED ORDER — NICOTINE 21 MG/24HR TD PT24
21.0000 mg | MEDICATED_PATCH | Freq: Every day | TRANSDERMAL | Status: DC
  Administered 2015-01-06 – 2015-01-07 (×2): 21 mg via TRANSDERMAL
  Filled 2015-01-05 (×3): qty 1

## 2015-01-05 MED ORDER — CINACALCET HCL 30 MG PO TABS
60.0000 mg | ORAL_TABLET | Freq: Every day | ORAL | Status: DC
  Administered 2015-01-06: 30 mg via ORAL
  Administered 2015-01-07 – 2015-01-13 (×6): 60 mg via ORAL
  Filled 2015-01-05 (×8): qty 2

## 2015-01-05 MED ORDER — ZIPRASIDONE HCL 40 MG PO CAPS
60.0000 mg | ORAL_CAPSULE | Freq: Two times a day (BID) | ORAL | Status: DC
  Administered 2015-01-06: 60 mg via ORAL
  Filled 2015-01-05: qty 1

## 2015-01-05 MED ORDER — LORAZEPAM 0.5 MG PO TABS
0.5000 mg | ORAL_TABLET | Freq: Every day | ORAL | Status: DC | PRN
  Administered 2015-01-05: 0.5 mg via ORAL
  Filled 2015-01-05: qty 1

## 2015-01-05 MED ORDER — ACETAMINOPHEN 325 MG PO TABS
650.0000 mg | ORAL_TABLET | ORAL | Status: DC | PRN
  Administered 2015-01-05 – 2015-01-11 (×6): 650 mg via ORAL
  Filled 2015-01-05 (×6): qty 2

## 2015-01-05 NOTE — ED Notes (Signed)
TTS in progress at current time. Encouraging pt to speak with Arby Barrette, Education officer, museum.

## 2015-01-05 NOTE — ED Notes (Signed)
Spoke with sheriff regarding transportation of pt to [employer]. Per sheriff, "may be a while before they get to her." Will notify this RN when better ETA is available and then will get EMTALA completed by POD E MD.

## 2015-01-05 NOTE — ED Notes (Signed)
Sheets were changed, given new warm blankets. Pt's snack is at bedside. Lunch tray was ordered.

## 2015-01-05 NOTE — BH Assessment (Signed)
Holly RN put teleassessment in pt's room for reassessment. Writer reassessed pt. Pt appears guarded. She is a poor historian. She won't answer most of pt's questions and stares at the teleassessment screen as she lies on her back. Pt does answer that she hasn't spoken with her caretaker daughter but has spoken with another daughter named Marzetta Board. Pt then asks Probation officer, "Where's Carlos?". Pt says Clifton James is her grandson. She then says, "Where's Genesis?". Writer is oriented to person and year only. Pt often cuts her eyes toward the door. She appears fearful at times.  Pt is under review at 6 geropsych facilities and TTS continues to look for inpatient placement for pt.    Arnold Long, [place] Therapeutic Triage Specialist

## 2015-01-05 NOTE — ED Notes (Addendum)
Breakfast delivered, pt eating w/ sitter bedside.

## 2015-01-05 NOTE — ED Notes (Signed)
This RN assisted pt and sheriff out to [employer] for transportation to [employer]. Pt in NAD. Ambulatory with steady gait. VSS. A/O

## 2015-01-05 NOTE — Progress Notes (Signed)
Admission Note:  Pt is a Jennifer Lawson female Admitted under IVC from [facility] ED to Dr. Nicki Reaper services.  As per IVC paperwork "Has a history of bipolar d/o. Is currently  Non-communicative,Refusing to take Meds, Aggressive with staff. Not caring for herself and refusing to go to dialysis".  Pt was admitted to the unit.  A skin search and contraband search was performed.  Findings were unremarkable.  Patient has a sad flat affect.  Very quiet and not forthcoming with information.  Attempted to make pt comfortable.  Patient was given sandwich.  Oriented to Room and dayroom. Currently sitting with staff without incident. Q15 min rounds started.

## 2015-01-05 NOTE — ED Notes (Signed)
Pt up to nurses station to make a phone call. Pt

## 2015-01-05 NOTE — ED Notes (Signed)
Pt complaining of substernal chest pain, asking for pain medication. EKG performed by NT Kaitlyn. Pt does not appear to be in any distress. Will notify MD. Received in report that labs are supposed to be re-drawn this morning to reassess since pt has not had dialysis since the 20th of August. Will notify MD of this as well. Difficult to assess pt, pt is not cooperating with assessment, when asked questions, pt stares blankly at this RN and is not speaking.

## 2015-01-05 NOTE — ED Notes (Addendum)
Pt not cooperating with taking medications. After discussing with pt the importance of taking her medications multiple times, pt snatched medications from this RN and threw the med cup back at me, threw the crackers on the bedside table and began cursing at this RN. Pt remains lying in bed, rolled to right side and began ignoring this RN. Security at nurses station in POD C.

## 2015-01-05 NOTE — Progress Notes (Signed)
Continuing to seek inpatient placement   Referred to: [place]- per Angela Nevin, fax referral, however dialysis is difficult to accommodate and required MD-to MD report (with nephrologist)  [facility]- per [facility]- per Dr. Dwyane Dee pt to be prioritized for admission. Spoke with Jarrett Soho at [facility] who states pt will be reviewed.  At capacity: [facility]  Other facilities (including [facility] and gero facilities) unable to accommodate dialysis needs.    Sharren Bridge, MSW, LCSW Clinical Social Work, Disposition  01/05/2015 [phone]

## 2015-01-05 NOTE — ED Notes (Signed)
Pt ambulatory to shower; given new scrubs to change into. Sitter with pt.

## 2015-01-05 NOTE — Progress Notes (Signed)
Per Jarrett Soho at [facility], pt accepted to bed 320 by Dr. Bary Leriche. Pt can arrive after 2:30pm. RN report [phone]. Pt under IVC.   Sharren Bridge, MSW, LCSW Clinical Social Work, Disposition  01/05/2015 [phone]

## 2015-01-05 NOTE — ED Notes (Signed)
Pt out to nurses station, pacing the halls, attempting to make a phone call, notified pt that if she makes another phone call that is her second 5 min call for the entire day, pt decided not to make phone call. Asking pt multiple times to take medication. Pt continuing to look for doors to exit, security at nurses station at this time. Pt made aware that she cannot leave as she is IVC'd - pt not satisfied with this statement. States "this isn't fair." pt back to pt room and lying in bed with sitter at bedside.

## 2015-01-06 DIAGNOSIS — F3164 Bipolar disorder, current episode mixed, severe, with psychotic features: Secondary | ICD-10-CM

## 2015-01-06 LAB — PHOSPHORUS: Phosphorus: 4.4 mg/dL (ref 2.5–4.6)

## 2015-01-06 MED ORDER — ZIPRASIDONE HCL 40 MG PO CAPS
60.0000 mg | ORAL_CAPSULE | ORAL | Status: AC
  Administered 2015-01-06: 60 mg via ORAL
  Filled 2015-01-06: qty 1

## 2015-01-06 MED ORDER — ZIPRASIDONE MESYLATE 20 MG IM SOLR: 20.0000 mg | Freq: Every day | INTRAMUSCULAR | Status: DC | PRN

## 2015-01-06 MED ORDER — ZIPRASIDONE HCL 40 MG PO CAPS
80.0000 mg | ORAL_CAPSULE | Freq: Two times a day (BID) | ORAL | Status: DC
  Administered 2015-01-06 – 2015-01-13 (×15): 80 mg via ORAL
  Filled 2015-01-06 (×15): qty 2

## 2015-01-06 MED ORDER — ZIPRASIDONE MESYLATE 20 MG IM SOLR: 20.0000 mg | Freq: Once | INTRAMUSCULAR | Status: DC

## 2015-01-06 MED ORDER — ZIPRASIDONE MESYLATE 20 MG IM SOLR
20.0000 mg | Freq: Once | INTRAMUSCULAR | Status: DC
  Filled 2015-01-06: qty 20

## 2015-01-06 MED ORDER — LORAZEPAM 1 MG PO TABS
1.0000 mg | ORAL_TABLET | ORAL | Status: DC | PRN
  Administered 2015-01-07 – 2015-01-10 (×5): 1 mg via ORAL
  Filled 2015-01-06 (×7): qty 1

## 2015-01-06 MED ORDER — NEPRO/CARBSTEADY PO LIQD: 237.0000 mL | ORAL | Status: DC | PRN

## 2015-01-06 MED ORDER — HEPARIN SODIUM (PORCINE) 1000 UNIT/ML DIALYSIS: 1000.0000 [IU] | INTRAMUSCULAR | Status: DC | PRN

## 2015-01-06 MED ORDER — PENTAFLUOROPROP-TETRAFLUOROETH EX AERO: 1.0000 "application " | INHALATION_SPRAY | CUTANEOUS | Status: DC | PRN

## 2015-01-06 MED ORDER — SODIUM CHLORIDE 0.9 % IV SOLN: 100.0000 mL | INTRAVENOUS | Status: DC | PRN

## 2015-01-06 MED ORDER — ALTEPLASE 2 MG IJ SOLR: 2.0000 mg | Freq: Once | INTRAMUSCULAR | Status: DC | PRN

## 2015-01-06 MED ORDER — ZIPRASIDONE MESYLATE 20 MG IM SOLR
INTRAMUSCULAR | Status: AC
  Administered 2015-01-06: 16:00:00
  Filled 2015-01-06: qty 20

## 2015-01-06 MED ORDER — LIDOCAINE HCL (PF) 1 % IJ SOLN: 5.0000 mL | INTRAMUSCULAR | Status: DC | PRN

## 2015-01-06 MED ORDER — LIDOCAINE-PRILOCAINE 2.5-2.5 % EX CREA
1.0000 "application " | TOPICAL_CREAM | CUTANEOUS | Status: DC | PRN
  Filled 2015-01-06: qty 5

## 2015-01-06 NOTE — Care Management Utilization Note (Signed)
Initial review submitted to [facility] via online portal; request # [id number]; Skipper Cliche, RN

## 2015-01-06 NOTE — Progress Notes (Signed)
Patient ID: Jennifer Lawson, female   DOB: 1960/10/20, 54 y.o.   MRN: [id number] [name] has a history of bipolar disorder and end-stage renal disease. She was admitted for worsening of psychosis in the context of medication noncompliance. For the past 10 days she refused medications and dialysis. She was admitted to [facility] to treat bipolar mania and to receive dialysis.   This morning she was quite enthusiastic about getting treatment this morning but now she is unable to sign a consent. She does not refuse treatment but is rather suspicious of [place]. She is preoccupied with irrelevant issues.   The patient does not have the capacity to decide about dialysis. We gave her 20 mg of IM Geodon to calm her down and get her ready for dialysis today. Unfortunately on this unit. We do not administer medications by IV. I hope that IV Ativan can be given to the patient while in dialysis.

## 2015-01-06 NOTE — Plan of Care (Signed)
Problem: Ineffective individual coping Goal: LTG: Patient will report a decrease in negative feelings Outcome: Not Progressing Patient continues to state that she does not know why she is here.

## 2015-01-06 NOTE — Progress Notes (Signed)
Initial Nutrition Assessment   INTERVENTION:   Meals and Snacks: Cater to patient preferences Medical Food Supplement Therapy: will recommend Nepro on follow if intake remains poor.  Coordination of Care: await accurate weight when able   NUTRITION DIAGNOSIS:   Inadequate oral intake related to acute illness as evidenced by meal completion < 25% today at lunch.   GOAL:   Patient will meet greater than or equal to 90% of their needs  MONITOR:    (Energy Intake, Electrolyte and renal Profile, Anthropometrics)  REASON FOR ASSESSMENT:    (Diet Order)    ASSESSMENT:   Pt admitted with depression, refusing HD, medications.   Past Medical History  Diagnosis Date  . Mental disorder   . Depression   . Hypertension   . Overdose   . Tobacco use disorder 11/13/2012  . Complication of anesthesia     difficulty going to sleep  . Chronic kidney disease     06/11/13- not on dialysis  . Shortness of breath     lying down flat  . PTSD (post-traumatic stress disorder)   . Asthma   . COPD (chronic obstructive pulmonary disease)   . Heart murmur   . GERD (gastroesophageal reflux disease)   . Seizures     "passsed out"  . History of blood transfusion   . Diabetes mellitus without complication     denies     Diet Order:  Diet renal with fluid restriction Fluid restriction:: 1200 mL Fluid; Room service appropriate?: Yes; Fluid consistency:: Thin   Current Nutrition: Pt ate 10% of lunch, however 90% of breakfast and 100% of dinner last night recorded.   Food/Nutrition-Related History: Per MST no decrease in appetite PTA.   Medications: Sensipar, Geodon  Electrolyte/Renal Profile and Glucose Profile:   Recent Labs Lab 01/03/15 1355 01/04/15 1004 01/05/15 0749  NA 139 140 142  K 5.0 5.0 4.8  CL 113* 114* 112*  CO2 19*  --   --   BUN 48* 57* 46*  CREATININE 4.23* 3.90* 3.60*  CALCIUM 9.6  --   --   GLUCOSE 80 108* 93   Protein Profile:  Recent Labs Lab  01/03/15 1355  ALBUMIN 3.0*    Gastrointestinal Profile: Last BM:   Weight Change: Per CHL weight relatively stable.  Height:   Ht Readings from Last 1 Encounters:  07/29/14 5\' 2"  (1.575 m)    Weight:   Wt Readings from Last 1 Encounters:  07/31/14 188 lb 4.8 oz (85.412 kg)   Wt Readings from Last 10 Encounters:  07/31/14 188 lb 4.8 oz (85.412 kg)  05/19/14 182 lb 12.2 oz (82.9 kg)  04/12/14 203 lb (92.08 kg)  04/01/14 186 lb (84.369 kg)  03/12/14 184 lb (83.462 kg)  02/05/14 184 lb (83.462 kg)  01/20/14 192 lb 7.4 oz (87.3 kg)  09/24/13 195 lb (88.451 kg)  06/30/13 225 lb 8.5 oz (102.3 kg)  06/11/13 213 lb (96.616 kg)    BMI:  There is no weight on file to calculate BMI.  EDUCATION NEEDS:   Education needs no appropriate at this time   [place], [place], LDN Pager [phone]

## 2015-01-06 NOTE — H&P (Signed)
Psychiatric Admission Assessment Adult  Patient Identification: Jennifer Lawson MRN:  [id number] Date of Evaluation:  01/06/2015 Chief Complaint:  depression Principal Diagnosis: Bipolar affective disorder, current episode mixed, without psychotic features Diagnosis:   Patient Active Problem List   Diagnosis Date Noted  . Bipolar affective disorder, current episode mixed, without psychotic features [F31.60] 01/05/2015  . Pain in the chest [R07.9]   . Pleuritic chest pain [R07.81] 07/31/2014  . Symptomatic anemia [D64.9] 07/29/2014  . Absolute anemia [D64.9]   . CKD (chronic kidney disease) stage V requiring chronic dialysis [N18.6, Z99.2] 05/13/2014  . Salicylate overdose [G92.119E] 05/13/2014  . ESRD (end stage renal disease) [N18.6] 01/16/2014  . MDD (major depressive disorder), recurrent, severe, with psychosis [F33.3] 09/29/2013  . COPD (chronic obstructive pulmonary disease) [J44.9] 09/26/2013  . COPD exacerbation [J44.1] 04/24/2013  . Hyperkalemia [E87.5] 04/23/2013  . Acute respiratory failure [J96.00] 04/23/2013  . Hypercalcemia [E83.52] 02/20/2013  . Hyperparathyroidism, primary [E21.0] 02/20/2013  . Tobacco use disorder [Z72.0] 11/13/2012  . Overdose of salicylate [R74.081K] 48/18/5631  . HTN (hypertension) [I10] 02/20/2012   History of Present Illness::   Identifying data. Jennifer Lawson is a 54 year old female with a history of bipolar disorder.  Chief complaint. "What's going on?"  History of present illness. Jennifer Lawson has a long history of bipolar. She was hospitalized several times at [facility] in 2015 for manic, psychotic episodes. Her daughter's report the patient stopped taking medications few weeks ago and became increasingly disorganized and difficult to work with. She refused to go to dialysis and has not received this necessary treatment since August 20. The family reports the patient is insomnia, irritable, agitated, intrusive, hard to redirect,  paranoid, and hallucinating. The patient denies any symptoms of depression, anxiety, or psychosis. She denies symptoms of bipolar mania. She denies using alcohol or illicit substances. She is uncooperative on the interview. Her thinking is disorganized and she is not able to answer simple questions. She is oriented to person and place only. She does not believe that I am a physician. When in my office, she looks about as if she attends to internal stimuli.  Psychiatric history. Several admissions for manic episodes last time in 2015. There is history of suicide attempt by overdose.  Family psychiatric history. Unknown.  Social history. She is disabled from mental illness. She lives with her daughter. She has had insurance.  Total Time spent with patient: 1 hour  Past Medical History:  Past Medical History  Diagnosis Date  . Mental disorder   . Depression   . Hypertension   . Overdose   . Tobacco use disorder 11/13/2012  . Complication of anesthesia     difficulty going to sleep  . Chronic kidney disease     06/11/13- not on dialysis  . Shortness of breath     lying down flat  . PTSD (post-traumatic stress disorder)   . Asthma   . COPD (chronic obstructive pulmonary disease)   . Heart murmur   . GERD (gastroesophageal reflux disease)   . Seizures     "passsed out"  . History of blood transfusion   . Diabetes mellitus without complication     denies    Past Surgical History  Procedure Laterality Date  . Right knee replacement      she says it was last year.  . Esophagogastroduodenoscopy Left 11/14/2012    Procedure: ESOPHAGOGASTRODUODENOSCOPY (EGD);  Surgeon: [name], MD;  Location: [facility];  Service: Endoscopy;  Laterality: Left;  .  Joint replacement Right     knee  . Parathyroidectomy    . Av fistula placement Right 06/12/2013    Procedure: ARTERIOVENOUS (AV) FISTULA CREATION; RIGHT  BASILIC VEIN TRANSPOSITION with Intraoperative ultrasound;  Surgeon: Mal [name], MD;   Location: [facility] OR;  Service: Vascular;  Laterality: Right;   Family History:  Family History  Problem Relation Age of Onset  . Diabetes Mother   . Hyperlipidemia Mother   . Hypertension Mother   . Diabetes Father   . Hypertension Father   . Hyperlipidemia Father    Social History:  History  Alcohol Use No    Comment: none in over a month per daughter     History  Drug Use No    Comment: pt denied any drug use    Social History   Social History  . Marital Status: Divorced    Spouse Name: N/A  . Number of Children: N/A  . Years of Education: N/A   Social History Main Topics  . Smoking status: Current Every Day Smoker -- 2.00 packs/day for 40 years    Types: Cigarettes, Cigars  . Smokeless tobacco: Never Used  . Alcohol Use: No     Comment: none in over a month per daughter  . Drug Use: No     Comment: pt denied any drug use  . Sexual Activity: No   Other Topics Concern  . None   Social History Narrative   Additional Social History:                          Musculoskeletal: Strength & Muscle Tone: within normal limits Gait & Station: normal Patient leans: N/A  Psychiatric Specialty Exam: Physical Exam  Nursing note and vitals reviewed.   Review of Systems  All other systems reviewed and are negative.   Blood pressure 146/90, pulse 86, temperature 98.2 F (36.8 C), temperature source Oral, resp. rate 18, last menstrual period 05/09/2008, SpO2 100 %.There is no weight on file to calculate BMI.  See SRA.                                                  Sleep:  Number of Hours: 4   Risk to Self: Is patient at risk for suicide?: Yes Risk to Others:   Prior Inpatient Therapy:   Prior Outpatient Therapy:    Alcohol Screening:    Allergies:   Allergies  Allergen Reactions  . Codeine Sulfate Anaphylaxis    Daughter called about having this allergy   . Depakote [Divalproex Sodium] Other (See Comments)    Nose bleeds  .  Gabapentin Other (See Comments)    seizures  . Haldol [Haloperidol Lactate] Shortness Of Breath  . Risperidone And Related Shortness Of Breath  . Trazodone And Nefazodone Other (See Comments)    Makes pt lose balance and fall  . Invega [Paliperidone Er] Nausea And Vomiting  . Vistaril [Hydroxyzine Hcl] Nausea And Vomiting   Lab Results:  Results for orders placed or performed during the hospital encounter of 01/03/15 (from the past 48 hour(s))  I-stat chem 8, ed     Status: Abnormal   Collection Time: 01/05/15  7:49 AM  Result Value Ref Range   Sodium 142 135 - 145 mmol/L   Potassium 4.8 3.5 - 5.1 mmol/L  Chloride 112 (H) 101 - 111 mmol/L   BUN 46 (H) 6 - 20 mg/dL   Creatinine, Ser 3.60 (H) 0.44 - 1.00 mg/dL   Glucose, Bld 93 65 - 99 mg/dL   Calcium, Ion 1.32 (H) 1.12 - 1.23 mmol/L   TCO2 21 0 - 100 mmol/L   Hemoglobin 12.2 12.0 - 15.0 g/dL   HCT 36.0 36.0 - 46.0 %   Current Medications: Current Facility-Administered Medications  Medication Dose Route Frequency Provider Last Rate Last Dose  . acetaminophen (TYLENOL) tablet 650 mg  650 mg Oral Q4H PRN Gonzella Lex, MD   650 mg at 01/06/15 1002  . cinacalcet (SENSIPAR) tablet 60 mg  60 mg Oral Q breakfast Gonzella Lex, MD   30 mg at 01/06/15 0819  . doxepin (SINEQUAN) capsule 25 mg  25 mg Oral QHS Gonzella Lex, MD   25 mg at 01/05/15 2319  . LORazepam (ATIVAN) tablet 1 mg  1 mg Oral Q4H PRN Ayako Tapanes B Antawn Sison, MD      . metoprolol tartrate (LOPRESSOR) tablet 12.5 mg  12.5 mg Oral BID Gonzella Lex, MD   12.5 mg at 01/06/15 1006  . nicotine (NICODERM CQ - dosed in mg/24 hours) patch 21 mg  21 mg Transdermal Daily Gonzella Lex, MD   21 mg at 01/06/15 1000  . ziprasidone (GEODON) capsule 60 mg  60 mg Oral BID WC Gonzella Lex, MD   60 mg at 01/06/15 0820   PTA Medications: Prescriptions prior to admission  Medication Sig Dispense Refill Last Dose  . acetaminophen (TYLENOL) 325 MG tablet Take 2 tablets (650 mg total) by  mouth every 4 (four) hours as needed for fever, headache or mild pain.   Past Month at Unknown time  . bisacodyl (DULCOLAX) 5 MG EC tablet Take 1 tablet (5 mg total) by mouth daily as needed for moderate constipation. (Patient not taking: Reported on 10/13/2014) 30 tablet 0 Not Taking at Unknown time  . cinacalcet (SENSIPAR) 60 MG tablet Take 60 mg by mouth daily.   12/31/2014 at Unknown time  . Darbepoetin Alfa (ARANESP) 60 MCG/0.3ML SOSY injection Inject 0.3 mLs (60 mcg total) into the vein every Thursday with hemodialysis. 4.2 mL  10/09/2014  . divalproex (DEPAKOTE ER) 250 MG 24 hr tablet Take 500 mg by mouth 2 (two) times daily.   12/31/2014  . divalproex (DEPAKOTE ER) 500 MG 24 hr tablet Take 500-1,000 mg by mouth 2 (two) times daily. Take 500 mg in the morning and 1000 mg in the evening.  1 Not Taking at Unknown time  . docusate sodium 100 MG CAPS Take 100 mg by mouth 2 (two) times daily. (Patient not taking: Reported on 10/13/2014) 10 capsule 0 Not Taking at Unknown time  . doxepin (SINEQUAN) 25 MG capsule Take 25 mg by mouth at bedtime.  1 12/31/2014  . ferric gluconate 250 mg in sodium chloride 0.9 % 100 mL Inject 250 mg into the vein every Tuesday, Thursday, Saturday, and Sunday.   10/09/2014  . ibuprofen (ADVIL,MOTRIN) 200 MG tablet Take 600 mg by mouth every 6 (six) hours as needed for mild pain, moderate pain or cramping.   Past Month at Unknown time  . LORazepam (ATIVAN) 0.5 MG tablet Take 0.5 mg by mouth daily as needed for anxiety.   12/31/2014  . metoprolol tartrate (LOPRESSOR) 25 MG tablet Take 0.5 tablets (12.5 mg total) by mouth 2 (two) times daily. For HTN (Patient taking differently: Take 25  mg by mouth 2 (two) times daily. For HTN) 60 tablet 0 12/31/2014 at 0800  . Nutritional Supplements (FEEDING SUPPLEMENT, NEPRO CARB STEADY,) LIQD Take 237 mLs by mouth as needed (missed meal during dialysis.). (Patient not taking: Reported on 10/13/2014)  0 Not Taking at Unknown time  . pantoprazole  (PROTONIX) 40 MG tablet Take 1 tablet (40 mg total) by mouth daily. (Patient not taking: Reported on 10/13/2014) 30 tablet 0 Not Taking at Unknown time  . ziprasidone (GEODON) 60 MG capsule Take 1 capsule (60 mg total) by mouth 2 (two) times daily with a meal. For mood control 14 capsule 0 12/31/2014    Previous Psychotropic Medications: Yes   Substance Abuse History in the last 12 months:  No.    Consequences of Substance Abuse: NA  Results for orders placed or performed during the hospital encounter of 01/03/15 (from the past 72 hour(s))  Urine rapid drug screen (hosp performed)     Status: Abnormal   Collection Time: 01/03/15  6:57 PM  Result Value Ref Range   Opiates NONE DETECTED NONE DETECTED   Cocaine NONE DETECTED NONE DETECTED   Benzodiazepines POSITIVE (A) NONE DETECTED   Amphetamines NONE DETECTED NONE DETECTED   Tetrahydrocannabinol NONE DETECTED NONE DETECTED   Barbiturates NONE DETECTED NONE DETECTED    Comment:        DRUG SCREEN FOR MEDICAL PURPOSES ONLY.  IF CONFIRMATION IS NEEDED FOR ANY PURPOSE, NOTIFY LAB WITHIN 5 DAYS.        LOWEST DETECTABLE LIMITS FOR URINE DRUG SCREEN Drug Class       Cutoff (ng/mL) Amphetamine      1000 Barbiturate      200 Benzodiazepine   683 Tricyclics       419 Opiates          300 Cocaine          300 THC              50   I-stat chem 8, ed     Status: Abnormal   Collection Time: 01/04/15 10:04 AM  Result Value Ref Range   Sodium 140 135 - 145 mmol/L   Potassium 5.0 3.5 - 5.1 mmol/L   Chloride 114 (H) 101 - 111 mmol/L   BUN 57 (H) 6 - 20 mg/dL   Creatinine, Ser 3.90 (H) 0.44 - 1.00 mg/dL   Glucose, Bld 108 (H) 65 - 99 mg/dL   Calcium, Ion 1.29 (H) 1.12 - 1.23 mmol/L   TCO2 20 0 - 100 mmol/L   Hemoglobin 13.9 12.0 - 15.0 g/dL   HCT 41.0 36.0 - 46.0 %  I-stat chem 8, ed     Status: Abnormal   Collection Time: 01/05/15  7:49 AM  Result Value Ref Range   Sodium 142 135 - 145 mmol/L   Potassium 4.8 3.5 - 5.1 mmol/L    Chloride 112 (H) 101 - 111 mmol/L   BUN 46 (H) 6 - 20 mg/dL   Creatinine, Ser 3.60 (H) 0.44 - 1.00 mg/dL   Glucose, Bld 93 65 - 99 mg/dL   Calcium, Ion 1.32 (H) 1.12 - 1.23 mmol/L   TCO2 21 0 - 100 mmol/L   Hemoglobin 12.2 12.0 - 15.0 g/dL   HCT 36.0 36.0 - 46.0 %    Observation Level/Precautions:  15 minute checks  Laboratory:  CBC Chemistry Profile UA Vitamin B-12  Psychotherapy:    Medications:    Consultations:    Discharge Concerns:    Estimated  LOS:  Other:     Psychological Evaluations: No   Treatment Plan Summary: Daily contact with patient to assess and evaluate symptoms and progress in treatment and Medication management  Medical Decision Making:  New problem, with additional work up planned, Review of Psycho-Social Stressors (1), Review or order clinical lab tests (1), Review of Medication Regimen & Side Effects (2) and Review of New Medication or Change in Dosage (2)   Jennifer Lawson is a 54 year old female with a history of bipolar disorder on dialysis admitted for manic, psychotic episodes in the context of medication noncompliance.  1. Mood and psychosis. The patient was started on Geodon 60 mg twice daily. She is allergic to many medications. We will continue Geodon for now. We will add Ativan 1 mg every 4 hours as needed for anxiety and agitation. Doxepin is used to address insomnia.  2. ESRD. The patient should be on dialysis Tuesday, Thursday, Saturday. Her last dialysis was on the 20th. She reportedly has refused her medications and dialysis since. Will ask nephrology for help. The patient is ready to take dialysis and actually has been asking for dialysis today.   3. Hypertension. She is on metoprolol.  4. Smoking. Nicotine patch is available.  5. Disposition. She will likely return to home with her daughter. Follow-up with her regular psychiatrist.   I certify that inpatient services furnished can reasonably be expected to improve the patient's  condition.   Jennifer Lawson 8/30/20162:35 PM

## 2015-01-06 NOTE — Progress Notes (Signed)
HD tx start 

## 2015-01-06 NOTE — Progress Notes (Signed)
Pre-hd tx 

## 2015-01-06 NOTE — Progress Notes (Signed)
The patient transitions from periods of tearfulness and anxiety to agitation. The patient is paranoid and distrusting of staff. The patient had c/o headache at 10 am. Tylenol was given with good result. The patient was unable to rate the level of pain on a 1-10 scale when asked. The patient was concerned with missing dialysis during shift. 20 mg Geodon IM given for anxiety. Patient currently in dialysis.

## 2015-01-06 NOTE — Consult Note (Signed)
[facility] CONSULT NOTE    Date: 01/06/2015                  Patient Name:  Jennifer Lawson  MRN: [id number]  DOB: Sep 16, 1960  Age / Sex: 54 y.o., female         PCP: [facility]                 Service Requesting Consult: Dr. Bary Leriche                 Reason for Consult: Evaluation and management of ESRD            History of Present Illness: Patient is a 54 y.o. female with a PMHx of ESRD on HD since 1/16 followed by [facility], COPD, secondary hyperparathyroidism, anemia of CKD, bipolar affective disorder, who was admitted to [facility] on 01/05/2015 for evaluation of depression In the setting of known bipolar disorder.  She apparently has not had dialysis for the past 10 days.  She has also not been adherent with her psychotherapeutic medications.   Despite not having had dialysis in 10 days, her BUN is currently 46 with a Creatinine of 3.6.  She has a functional LUE AVF in place.  She takes sensipar for secondary hyperparathyroidism.  When we were contacted earlier today patient wasn't consenting to dialysis.  However Dr. Bary Leriche felt that the patient didn't have the Capacity to make this decision in any case.  Therefore we both deemed it medically necessary that the patient receive dialysis. She was given IM geodon prior to our evaluation.   Medications: Outpatient medications: Prescriptions prior to admission  Medication Sig Dispense Refill Last Dose  . acetaminophen (TYLENOL) 325 MG tablet Take 2 tablets (650 mg total) by mouth every 4 (four) hours as needed for fever, headache or mild pain.   Past Month at Unknown time  . bisacodyl (DULCOLAX) 5 MG EC tablet Take 1 tablet (5 mg total) by mouth daily as needed for moderate constipation. (Patient not taking: Reported on 10/13/2014) 30 tablet 0 Not Taking at Unknown time  . cinacalcet (SENSIPAR) 60 MG tablet Take 60 mg by mouth daily.   12/31/2014 at Unknown time  . Darbepoetin Alfa (ARANESP) 60  MCG/0.3ML SOSY injection Inject 0.3 mLs (60 mcg total) into the vein every Thursday with hemodialysis. 4.2 mL  10/09/2014  . divalproex (DEPAKOTE ER) 250 MG 24 hr tablet Take 500 mg by mouth 2 (two) times daily.   12/31/2014  . divalproex (DEPAKOTE ER) 500 MG 24 hr tablet Take 500-1,000 mg by mouth 2 (two) times daily. Take 500 mg in the morning and 1000 mg in the evening.  1 Not Taking at Unknown time  . docusate sodium 100 MG CAPS Take 100 mg by mouth 2 (two) times daily. (Patient not taking: Reported on 10/13/2014) 10 capsule 0 Not Taking at Unknown time  . doxepin (SINEQUAN) 25 MG capsule Take 25 mg by mouth at bedtime.  1 12/31/2014  . ferric gluconate 250 mg in sodium chloride 0.9 % 100 mL Inject 250 mg into the vein every Tuesday, Thursday, Saturday, and Sunday.   10/09/2014  . ibuprofen (ADVIL,MOTRIN) 200 MG tablet Take 600 mg by mouth every 6 (six) hours as needed for mild pain, moderate pain or cramping.   Past Month at Unknown time  . LORazepam (ATIVAN) 0.5 MG tablet Take 0.5 mg by mouth daily as needed for anxiety.   12/31/2014  . metoprolol tartrate (  LOPRESSOR) 25 MG tablet Take 0.5 tablets (12.5 mg total) by mouth 2 (two) times daily. For HTN (Patient taking differently: Take 25 mg by mouth 2 (two) times daily. For HTN) 60 tablet 0 12/31/2014 at 0800  . Nutritional Supplements (FEEDING SUPPLEMENT, NEPRO CARB STEADY,) LIQD Take 237 mLs by mouth as needed (missed meal during dialysis.). (Patient not taking: Reported on 10/13/2014)  0 Not Taking at Unknown time  . pantoprazole (PROTONIX) 40 MG tablet Take 1 tablet (40 mg total) by mouth daily. (Patient not taking: Reported on 10/13/2014) 30 tablet 0 Not Taking at Unknown time  . ziprasidone (GEODON) 60 MG capsule Take 1 capsule (60 mg total) by mouth 2 (two) times daily with a meal. For mood control 14 capsule 0 12/31/2014    Current medications: Current Facility-Administered Medications  Medication Dose Route Frequency Provider Last Rate Last Dose  .  acetaminophen (TYLENOL) tablet 650 mg  650 mg Oral Q4H PRN Gonzella Lex, MD   650 mg at 01/06/15 1002  . cinacalcet (SENSIPAR) tablet 60 mg  60 mg Oral Q breakfast Gonzella Lex, MD   30 mg at 01/06/15 0819  . doxepin (SINEQUAN) capsule 25 mg  25 mg Oral QHS Gonzella Lex, MD   25 mg at 01/05/15 2319  . LORazepam (ATIVAN) tablet 1 mg  1 mg Oral Q4H PRN Jolanta B Pucilowska, MD      . metoprolol tartrate (LOPRESSOR) tablet 12.5 mg  12.5 mg Oral BID Gonzella Lex, MD   12.5 mg at 01/06/15 1006  . nicotine (NICODERM CQ - dosed in mg/24 hours) patch 21 mg  21 mg Transdermal Daily Gonzella Lex, MD   21 mg at 01/06/15 1000  . ziprasidone (GEODON) capsule 80 mg  80 mg Oral BID WC Jolanta B Pucilowska, MD   80 mg at 01/06/15 1734  . ziprasidone (GEODON) injection 20 mg  20 mg Intramuscular Once Clovis Fredrickson, MD   20 mg at 01/06/15 1718  . ziprasidone (GEODON) injection 20 mg  20 mg Intramuscular Once Clovis Fredrickson, MD          Allergies: Allergies  Allergen Reactions  . Codeine Sulfate Anaphylaxis    Daughter called about having this allergy   . Depakote [Divalproex Sodium] Other (See Comments)    Nose bleeds  . Gabapentin Other (See Comments)    seizures  . Haldol [Haloperidol Lactate] Shortness Of Breath  . Risperidone And Related Shortness Of Breath  . Trazodone And Nefazodone Other (See Comments)    Makes pt lose balance and fall  . Invega [Paliperidone Er] Nausea And Vomiting  . Vistaril [Hydroxyzine Hcl] Nausea And Vomiting      Past Medical History: Past Medical History  Diagnosis Date  . Mental disorder   . Depression   . Hypertension   . Overdose   . Tobacco use disorder 11/13/2012  . Complication of anesthesia     difficulty going to sleep  . Chronic kidney disease     06/11/13- not on dialysis  . Shortness of breath     lying down flat  . PTSD (post-traumatic stress disorder)   . Asthma   . COPD (chronic obstructive pulmonary disease)   . Heart  murmur   . GERD (gastroesophageal reflux disease)   . Seizures     "passsed out"  . History of blood transfusion   . Diabetes mellitus without complication     denies     Past Surgical History:  Past Surgical History  Procedure Laterality Date  . Right knee replacement      she says it was last year.  . Esophagogastroduodenoscopy Left 11/14/2012    Procedure: ESOPHAGOGASTRODUODENOSCOPY (EGD);  Surgeon: [name], MD;  Location: [facility];  Service: Endoscopy;  Laterality: Left;  . Joint replacement Right     knee  . Parathyroidectomy    . Av fistula placement Right 06/12/2013    Procedure: ARTERIOVENOUS (AV) FISTULA CREATION; RIGHT  BASILIC VEIN TRANSPOSITION with Intraoperative ultrasound;  Surgeon: Mal [name], MD;  Location: [facility] OR;  Service: Vascular;  Laterality: Right;     Family History: Family History  Problem Relation Age of Onset  . Diabetes Mother   . Hyperlipidemia Mother   . Hypertension Mother   . Diabetes Father   . Hypertension Father   . Hyperlipidemia Father      Social History: Social History   Social History  . Marital Status: Divorced    Spouse Name: N/A  . Number of Children: N/A  . Years of Education: N/A   Occupational History  . Not on file.   Social History Main Topics  . Smoking status: Current Every Day Smoker -- 2.00 packs/day for 40 years    Types: Cigarettes, Cigars  . Smokeless tobacco: Never Used  . Alcohol Use: No     Comment: none in over a month per daughter  . Drug Use: No     Comment: pt denied any drug use  . Sexual Activity: No   Other Topics Concern  . Not on file   Social History Narrative     Review of Systems: Patient unable to focus and accurately answer ROS questions  Vital Signs: Blood pressure 146/90, pulse 86, temperature 98.2 F (36.8 C), temperature source Oral, resp. rate 18, last menstrual period 05/09/2008, SpO2 100 %.  Weight trends: There were no vitals filed for this visit.  Physical  Exam: General: Slightly agitated  Head: Normocephalic, atraumatic.  Eyes: Anicteric, EOMI  Nose: Mucous membranes moist, not inflammed, nonerythematous.  Throat: Oropharynx nonerythematous, no exudate appreciated.   Neck: Supple no JVD  Lungs:  Normal respiratory effort. Clear to auscultation BL without crackles or wheezes.  Heart: RRR. S1 and S2 normal without gallop, murmur, or rubs.  Abdomen:  BS normoactive. Soft, Nondistended, non-tender.  No masses or organomegaly.  Extremities: Trace b/l LE edema  Neurologic: Awake, alert, agitated, paranoid, follows simple commands, unsteady gait  Skin: No visible rashes, scars.    Lab results: Basic Metabolic Panel:  Recent Labs Lab 01/03/15 1355 01/04/15 1004 01/05/15 0749  NA 139 140 142  K 5.0 5.0 4.8  CL 113* 114* 112*  CO2 19*  --   --   GLUCOSE 80 108* 93  BUN 48* 57* 46*  CREATININE 4.23* 3.90* 3.60*  CALCIUM 9.6  --   --     Liver Function Tests:  Recent Labs Lab 01/03/15 1355  AST 14*  ALT 10*  ALKPHOS 107  BILITOT 0.6  PROT 6.8  ALBUMIN 3.0*   No results for input(s): LIPASE, AMYLASE in the last 168 hours. No results for input(s): AMMONIA in the last 168 hours.  CBC:  Recent Labs Lab 01/03/15 1355 01/04/15 1004 01/05/15 0749  WBC 3.6*  --   --   HGB 10.9* 13.9 12.2  HCT 34.8* 41.0 36.0  MCV 100.0  --   --   PLT 166  --   --     Cardiac Enzymes: No  results for input(s): CKTOTAL, CKMB, CKMBINDEX, TROPONINI in the last 168 hours.  BNP: Invalid input(s): POCBNP  CBG: No results for input(s): GLUCAP in the last 168 hours.  Microbiology: Results for orders placed or performed during the hospital encounter of 07/29/14  MRSA PCR Screening     Status: None   Collection Time: 07/30/14  5:40 AM  Result Value Ref Range Status   MRSA by PCR NEGATIVE NEGATIVE Final    Comment:        The GeneXpert MRSA Assay (FDA approved for NASAL specimens only), is one component of a comprehensive MRSA  colonization surveillance program. It is not intended to diagnose MRSA infection nor to guide or monitor treatment for MRSA infections.     Coagulation Studies: No results for input(s): LABPROT, INR in the last 72 hours.  Urinalysis: No results for input(s): COLORURINE, LABSPEC, PHURINE, GLUCOSEU, HGBUR, BILIRUBINUR, KETONESUR, PROTEINUR, UROBILINOGEN, NITRITE, LEUKOCYTESUR in the last 72 hours.  Invalid input(s): APPERANCEUR    Imaging:  No results found.   Assessment & Plan: Pt is a 54 y.o. yo female a PMHx of ESRD on HD since 1/16 followed by [facility], COPD, secondary hyperparathyroidism, anemia of CKD, bipolar affective disorder, who was admitted to [facility] on 01/05/2015 for evaluation of depression In the setting of known bipolar disorder.  She apparently has not had dialysis for the past 10 days.   1.  ESRD on HD TTHS: Pt missed dialysis over a ten day peroid prior to admission.  Started HD in 1/16, followed by [facility]: Pt in need of HD, Dr. Bary Leriche determined pt doesn't have capacity to make decisions, we are in agreement with this.  We have consented for dialysis for the patient as it is medically necessary and pt's condition is likely to deteriorate without HD. Given the time period in between her last HD, we will plan for short HD today with low BFRs.  Will reassess pt for HD on Thursday as well.    2.  Anemia of CKD: hgb 12.2, no indication for epogen.  3.  Secondary hyperparathyroidism of renal origin:  Check ipth/phos with HD. Continue sensipar for now.  4.  Hypertension:  Continue metoprolol for now.

## 2015-01-06 NOTE — Progress Notes (Signed)
Post hd tx 

## 2015-01-06 NOTE — Plan of Care (Signed)
Problem: Ineffective individual coping Goal: LTG: Patient will report a decrease in negative feelings Outcome: Not Progressing Not progressing    Goal: STG: Pt will be able to identify effective and ineffective STG: Pt will be able to identify effective and ineffective coping patterns  Outcome: Not Progressing Not progressing Goal: STG: Patient will remain free from self harm Outcome: Progressing No self harm    Goal: STG:Pt. will utilize relaxation techniques to reduce stress STG: Patient will utilize relaxation techniques to reduce stress levels  Outcome: Not Progressing Not progressing Goal: STG-Increase in ability to manage activities of daily living Outcome: Not Progressing Not progressing

## 2015-01-06 NOTE — Progress Notes (Signed)
HD tx completed.

## 2015-01-06 NOTE — Progress Notes (Signed)
Recreation Therapy Notes  Date: 08.30.16 Time: 3:00 pm Location: Craft Room  Group Topic: Coping Skills  Goal Area(s) Addresses:  Patients will verbalize emotions related to their recovery. Patients will write down healthy coping skills.  Behavioral Response: Did not attend  Intervention: Emotion Wheel  Activity: Patients were given a worksheet with 8 sections and instructed to come up as a group with 8 emotions related to recovery. Patients were instructed to list healthy coping skills.  Education: LRT educated patients on healthy coping skills   Education Outcome: Patient did not attend group.  Clinical Observations/Feedback: Patient did not attend group.  Leonette Monarch, LRT/CTRS 01/06/2015 4:18 PM

## 2015-01-06 NOTE — BHH Group Notes (Signed)
[facility] Group Notes:  (Nursing/MHT/Case Management/Adjunct)  Date:  01/06/2015  Time:  2:37 PM  Type of Therapy:  Psychoeducational Skills  Participation Level:  Did Not Attend   Adela Lank [facility] 01/06/2015, 2:37 PM

## 2015-01-06 NOTE — BHH Suicide Risk Assessment (Signed)
[facility] Admission Suicide Risk Assessment   Nursing information obtained from:    Demographic factors:    Current Mental Status:    Loss Factors:    Historical Factors:    Risk Reduction Factors:    Total Time spent with patient: 1 hour Principal Problem: Bipolar affective disorder, current episode mixed, without psychotic features Diagnosis:   Patient Active Problem List   Diagnosis Date Noted  . Bipolar affective disorder, current episode mixed, without psychotic features [F31.60] 01/05/2015  . Pain in the chest [R07.9]   . Pleuritic chest pain [R07.81] 07/31/2014  . Symptomatic anemia [D64.9] 07/29/2014  . Absolute anemia [D64.9]   . CKD (chronic kidney disease) stage V requiring chronic dialysis [N18.6, Z99.2] 05/13/2014  . Salicylate overdose [V78.588F] 05/13/2014  . ESRD (end stage renal disease) [N18.6] 01/16/2014  . MDD (major depressive disorder), recurrent, severe, with psychosis [F33.3] 09/29/2013  . COPD (chronic obstructive pulmonary disease) [J44.9] 09/26/2013  . COPD exacerbation [J44.1] 04/24/2013  . Hyperkalemia [E87.5] 04/23/2013  . Acute respiratory failure [J96.00] 04/23/2013  . Hypercalcemia [E83.52] 02/20/2013  . Hyperparathyroidism, primary [E21.0] 02/20/2013  . Tobacco use disorder [Z72.0] 11/13/2012  . Overdose of salicylate [O27.741O] 87/86/7672  . HTN (hypertension) [I10] 02/20/2012     Continued Clinical Symptoms:    The "Alcohol Use Disorders Identification Test", Guidelines for Use in Primary Care, Second Edition.  World Pharmacologist [facility]). Score between 0-7:  no or low risk or alcohol related problems. Score between 8-15:  moderate risk of alcohol related problems. Score between 16-19:  high risk of alcohol related problems. Score 20 or above:  warrants further diagnostic evaluation for alcohol dependence and treatment.   CLINICAL FACTORS:   Bipolar Disorder:   Mixed State   Musculoskeletal: Strength & Muscle Tone: within normal  limits Gait & Station: normal Patient leans: N/A  Psychiatric Specialty Exam: Physical Exam  Nursing note and vitals reviewed. Constitutional: She is oriented to person, place, and time. She appears well-developed and well-nourished.  HENT:  Head: Normocephalic and atraumatic.  Eyes: Conjunctivae and EOM are normal. Pupils are equal, round, and reactive to light.  Neck: Normal range of motion. Neck supple.  Cardiovascular: Normal rate, regular rhythm and normal heart sounds.   Respiratory: Effort normal and breath sounds normal.  GI: Soft. Bowel sounds are normal.  Musculoskeletal: Normal range of motion.  Neurological: She is alert and oriented to person, place, and time. She has normal reflexes.  Skin: Skin is warm and dry.    Review of Systems  All other systems reviewed and are negative.   Blood pressure 146/90, pulse 86, temperature 98.2 F (36.8 C), temperature source Oral, resp. rate 18, last menstrual period 05/09/2008, SpO2 100 %.There is no weight on file to calculate BMI.  General Appearance: Disheveled  Eye Contact::  Minimal  Speech:  Normal Rate  Volume:  Normal  Mood:  Dysphoric  Affect:  Labile  Thought Process:  Disorganized  Orientation:  Other:  Person and place only  Thought Content:  Delusions, Hallucinations: Auditory and Paranoid Ideation  Suicidal Thoughts:  No  Homicidal Thoughts:  No  Memory:  Immediate;   Poor Recent;   Poor Remote;   Poor  Judgement:  Impaired  Insight:  Lacking  Psychomotor Activity:  Increased  Concentration:  Poor  Recall:  Poor  Fund of Knowledge:Poor  Language: Poor  Akathisia:  No  Handed:  Right  AIMS (if indicated):     Assets:  Communication Skills Desire for Improvement Financial  Resources/Insurance Housing Social Support  Sleep:  Number of Hours: 4  Cognition: WNL  ADL's:  Intact     COGNITIVE FEATURES THAT CONTRIBUTE TO RISK:  None    SUICIDE RISK:   Moderate:  Frequent suicidal ideation with  limited intensity, and duration, some specificity in terms of plans, no associated intent, good self-control, limited dysphoria/symptomatology, some risk factors present, and identifiable protective factors, including available and accessible social support.  PLAN OF CARE: Hospital admission, medication management, nephrology consult and dialysis, discharge planning.  Medical Decision Making:  New problem, with additional work up planned, Review of Psycho-Social Stressors (1), Review or order clinical lab tests (1), Review of Medication Regimen & Side Effects (2) and Review of New Medication or Change in Dosage (2)   Jennifer Lawson is a 54 year old female with a history of bipolar disorder on dialysis admitted for manic, psychotic episodes in the context of medication noncompliance.  1. Mood and psychosis. The patient was started on Geodon 60 mg twice daily. She is allergic to many medications. We will continue Geodon for now. We will add Ativan 1 mg every 4 hours as needed for anxiety and agitation. Doxepin is used to address insomnia.  2. ESRD. The patient should be on dialysis Tuesday, Thursday, Saturday. Her last dialysis was on the 20th. She reportedly has refused her medications and dialysis since. Will ask nephrology for help. The patient is ready to take dialysis and actually has been asking for dialysis today.   3. Hypertension. She is on metoprolol.  4. Smoking. Nicotine patch is available.  5. Disposition. She will likely return to home with her daughter. Follow-up with her regular psychiatrist.   I certify that inpatient services furnished can reasonably be expected to improve the patient's condition.   Jolanta Pucilowska 01/06/2015, 2:25 PM

## 2015-01-07 DIAGNOSIS — I129 Hypertensive chronic kidney disease with stage 1 through stage 4 chronic kidney disease, or unspecified chronic kidney disease: Secondary | ICD-10-CM | POA: Diagnosis not present

## 2015-01-07 DIAGNOSIS — N186 End stage renal disease: Secondary | ICD-10-CM | POA: Diagnosis not present

## 2015-01-07 DIAGNOSIS — Z992 Dependence on renal dialysis: Secondary | ICD-10-CM | POA: Diagnosis not present

## 2015-01-07 LAB — PARATHYROID HORMONE, INTACT (NO CA): PTH: 460 pg/mL — ABNORMAL HIGH (ref 15–65)

## 2015-01-07 LAB — HEPATITIS B SURFACE ANTIGEN: Hepatitis B Surface Ag: NEGATIVE

## 2015-01-07 LAB — HEPATITIS B SURFACE ANTIBODY, QUANTITATIVE: Hepatitis B-Post: 133.9 m[IU]/mL (ref >9.9)

## 2015-01-07 MED ORDER — TRAMADOL HCL 50 MG PO TABS
100.0000 mg | ORAL_TABLET | Freq: Two times a day (BID) | ORAL | Status: DC | PRN
  Filled 2015-01-07 (×2): qty 2

## 2015-01-07 MED ORDER — DOXEPIN HCL 25 MG PO CAPS
50.0000 mg | ORAL_CAPSULE | Freq: Every day | ORAL | Status: DC
  Administered 2015-01-07 – 2015-01-09 (×3): 50 mg via ORAL
  Filled 2015-01-07 (×3): qty 2

## 2015-01-07 MED ORDER — DIVALPROEX SODIUM 250 MG PO DR TAB
250.0000 mg | DELAYED_RELEASE_TABLET | Freq: Three times a day (TID) | ORAL | Status: DC
  Administered 2015-01-07 – 2015-01-09 (×6): 250 mg via ORAL
  Filled 2015-01-07 (×6): qty 1

## 2015-01-07 MED ORDER — TRAMADOL HCL 50 MG PO TABS: 50.0000 mg | ORAL_TABLET | Freq: Four times a day (QID) | ORAL | Status: DC | PRN

## 2015-01-07 NOTE — Plan of Care (Signed)
Problem: Ineffective individual coping Goal: STG: Patient will remain free from self harm Outcome: Progressing Patient has remained safe from harm/injury, wheelchair available after HD treatment, remains on 15 minute checks for safety, compliant with medications.

## 2015-01-07 NOTE — Progress Notes (Signed)
Needy, demanding, required constant redirection, argumentative, ambivalent, requesting to be discharged; exhausted after HD, observed in [facility] and occassionally out of the [facility] ambulating with a steady gait. Encouraged and agreed to sleep in her room tonight.

## 2015-01-07 NOTE — Progress Notes (Signed)
Patient is less intrusive this shift although continues to fixate on dialysis and report that she does not need to be here.  Suspicious of medications and questions all medications.  Concerned about depakote. Dr. Bary Leriche made aware of this.  Refused to eat dinner.  Can be demanding at times.  No interaction noted with peers.

## 2015-01-07 NOTE — Progress Notes (Signed)
Recreation Therapy Notes  Date: 08.31.16 Time: 3:00 pm Location: Craft Room  Group Topic: Self-esteem  Goal Area(s) Addresses:  Patients will write down at least one positive trait about self. Patients will verbalize importance of having a good self-esteem.  Behavioral Response: Did not attend  Intervention: I Am  Activity: Patients were given a worksheet with the letter I on it and instructed to fill the letter with as many positive traits as they could.   Education: LRT educated patients on ways to increase their self-esteem.   Education Outcome: Patient did not attend group.  Clinical Observations/Feedback: Patient did not attend group.  Leonette Monarch, LRT/CTRS 01/07/2015 4:35 PM

## 2015-01-07 NOTE — Progress Notes (Signed)
Patient crying in the hallway, "I want to go home ... " Ativan 1 mg PO PRN given at 0422, and redirected back to sleep.

## 2015-01-07 NOTE — Plan of Care (Signed)
Problem: Altered Thought Processes AEB Goal: Asks for assistance with activities of daily living Outcome: Not Progressing Able to perform ADL's without assistance.  Requested to obtain supplies to take shower.

## 2015-01-07 NOTE — BHH Group Notes (Signed)
[facility] Group Notes:  (Nursing/MHT/Case Management/Adjunct)  Date:  01/07/2015  Time:  2:40 PM  Type of Therapy:  Psychoeducational Skills  Participation Level:  Minimal  Participation Quality:  Intrusive and came in late  Affect:  Flat  Cognitive:  Appropriate  Insight:  Lacking  Engagement in Group:  Off Topic  Modes of Intervention:  Support  Summary of Progress/Problems:  Jennifer Lawson 01/07/2015, 2:40 PM

## 2015-01-07 NOTE — Progress Notes (Signed)
Patient slept for 5.30 hours

## 2015-01-07 NOTE — Progress Notes (Signed)
Recreation Therapy Notes  At approximately 11:45 am, LRT attempted assessment. LRT completed approximately half of the assessment. Patient was sleepy from medication. At 4:10 pm, LRT spoke to patient's nurse regarding finishing the assessment. Patient's nurse reported patient was still sleeping.   Leonette Monarch, LRT/CTRS 01/07/2015 4:42 PM

## 2015-01-07 NOTE — Progress Notes (Signed)
[facility] MD Progress Note  01/07/2015 11:29 AM Jennifer Lawson  MRN:  [id number]  Subjective:  Jennifer Lawson was uncooperative yesterday especially about her dialysis. With much convincing she received half the treatment yesterday. She seems much more reasonable today. Her thinking is better organized. She is unable to participate in the interview. She is unable to participate in discharge planning. Her concerns today seem reasonable. She worries about her paycheck that she will get on the 2nd of September. She remembers that she was supposed to take care of her 55-year-old grandson and wants to be discharged as quickly as possible. She is asking about dialysis and is agreeable today. She points out to me that Depakote is not given in the hospital. She also complains of muscle cramps in her legs from dialysis and headache from an old injury and asked for pain medication. Tylenol has not been helpful. We will give Tramadol 100 mg bid, given her kidney function tests.  Principal Problem: Bipolar affective disorder, mixed, severe, with psychotic behavior Diagnosis:   Patient Active Problem List   Diagnosis Date Noted  . Bipolar affective disorder, mixed, severe, with psychotic behavior [F31.64] 01/05/2015  . Pain in the chest [R07.9]   . Pleuritic chest pain [R07.81] 07/31/2014  . Symptomatic anemia [D64.9] 07/29/2014  . Absolute anemia [D64.9]   . CKD (chronic kidney disease) stage V requiring chronic dialysis [N18.6, Z99.2] 05/13/2014  . Salicylate overdose [N05.397Q] 05/13/2014  . ESRD (end stage renal disease) [N18.6] 01/16/2014  . MDD (major depressive disorder), recurrent, severe, with psychosis [F33.3] 09/29/2013  . COPD (chronic obstructive pulmonary disease) [J44.9] 09/26/2013  . COPD exacerbation [J44.1] 04/24/2013  . Hyperkalemia [E87.5] 04/23/2013  . Acute respiratory failure [J96.00] 04/23/2013  . Hypercalcemia [E83.52] 02/20/2013  . Hyperparathyroidism, primary [E21.0] 02/20/2013  .  Tobacco use disorder [Z72.0] 11/13/2012  . Overdose of salicylate [B34.193X] 90/24/0973  . HTN (hypertension) [I10] 02/20/2012   Total Time spent with patient: 20 minutes   Past Medical History:  Past Medical History  Diagnosis Date  . Mental disorder   . Depression   . Hypertension   . Overdose   . Tobacco use disorder 11/13/2012  . Complication of anesthesia     difficulty going to sleep  . Chronic kidney disease     06/11/13- not on dialysis  . Shortness of breath     lying down flat  . PTSD (post-traumatic stress disorder)   . Asthma   . COPD (chronic obstructive pulmonary disease)   . Heart murmur   . GERD (gastroesophageal reflux disease)   . Seizures     "passsed out"  . History of blood transfusion   . Diabetes mellitus without complication     denies    Past Surgical History  Procedure Laterality Date  . Right knee replacement      she says it was last year.  . Esophagogastroduodenoscopy Left 11/14/2012    Procedure: ESOPHAGOGASTRODUODENOSCOPY (EGD);  Surgeon: [name], MD;  Location: [facility];  Service: Endoscopy;  Laterality: Left;  . Joint replacement Right     knee  . Parathyroidectomy    . Av fistula placement Right 06/12/2013    Procedure: ARTERIOVENOUS (AV) FISTULA CREATION; RIGHT  BASILIC VEIN TRANSPOSITION with Intraoperative ultrasound;  Surgeon: Mal [name], MD;  Location: [facility] OR;  Service: Vascular;  Laterality: Right;   Family History:  Family History  Problem Relation Age of Onset  . Diabetes Mother   . Hyperlipidemia Mother   . Hypertension Mother   .  Diabetes Father   . Hypertension Father   . Hyperlipidemia Father    Social History:  History  Alcohol Use No    Comment: none in over a month per daughter     History  Drug Use No    Comment: pt denied any drug use    Social History   Social History  . Marital Status: Divorced    Spouse Name: N/A  . Number of Children: N/A  . Years of Education: N/A   Social History Main  Topics  . Smoking status: Current Every Day Smoker -- 2.00 packs/day for 40 years    Types: Cigarettes, Cigars  . Smokeless tobacco: Never Used  . Alcohol Use: No     Comment: none in over a month per daughter  . Drug Use: No     Comment: pt denied any drug use  . Sexual Activity: No   Other Topics Concern  . None   Social History Narrative   Additional History:    Sleep: Good  Appetite:  Fair   Assessment:   Musculoskeletal: Strength & Muscle Tone: within normal limits Gait & Station: normal Patient leans: N/A   Psychiatric Specialty Exam: Physical Exam  Nursing note and vitals reviewed.   Review of Systems  Musculoskeletal: Positive for myalgias.  All other systems reviewed and are negative.   Blood pressure 133/86, pulse 85, temperature 97.2 F (36.2 C), temperature source Oral, resp. rate 20, weight 88 kg (194 lb 0.1 oz), last menstrual period 05/09/2008, SpO2 100 %.Body mass index is 35.47 kg/(m^2).  General Appearance: Casual  Eye Contact::  Good  Speech:  Clear and Coherent  Volume:  Normal  Mood:  Euthymic  Affect:  Appropriate  Thought Process:  Goal Directed  Orientation:  Full (Time, Place, and Person)  Thought Content:  WDL  Suicidal Thoughts:  No  Homicidal Thoughts:  No  Memory:  Immediate;   Fair Recent;   Fair Remote;   Fair  Judgement:  Fair  Insight:  Shallow  Psychomotor Activity:  Normal  Concentration:  Fair  Recall:  [place]  Language: Fair  Akathisia:  No  Handed:  Right  AIMS (if indicated):     Assets:  Communication Skills Desire for Improvement Financial Resources/Insurance Housing Social Support  ADL's:  Intact  Cognition: WNL  Sleep:  Number of Hours: 5.3     Current Medications: Current Facility-Administered Medications  Medication Dose Route Frequency Provider Last Rate Last Dose  . 0.9 %  sodium chloride infusion  100 mL Intravenous PRN Munsoor Lateef, MD      . 0.9 %  sodium chloride  infusion  100 mL Intravenous PRN Munsoor Lateef, MD      . acetaminophen (TYLENOL) tablet 650 mg  650 mg Oral Q4H PRN Gonzella Lex, MD   650 mg at 01/07/15 0942  . alteplase (CATHFLO ACTIVASE) injection 2 mg  2 mg Intracatheter Once PRN Munsoor Lateef, MD      . cinacalcet (SENSIPAR) tablet 60 mg  60 mg Oral Q breakfast Gonzella Lex, MD   60 mg at 01/07/15 9450  . doxepin (SINEQUAN) capsule 25 mg  25 mg Oral QHS Gonzella Lex, MD   25 mg at 01/06/15 2115  . feeding supplement (NEPRO CARB STEADY) liquid 237 mL  237 mL Oral PRN Munsoor Lateef, MD      . heparin injection 1,000 Units  1,000 Units Dialysis PRN Munsoor Holley Raring, MD      .  lidocaine (PF) (XYLOCAINE) 1 % injection 5 mL  5 mL Intradermal PRN Munsoor Lateef, MD      . lidocaine-prilocaine (EMLA) cream 1 application  1 application Topical PRN Munsoor Lateef, MD      . LORazepam (ATIVAN) tablet 1 mg  1 mg Oral Q4H PRN Clovis Fredrickson, MD   1 mg at 01/07/15 0942  . metoprolol tartrate (LOPRESSOR) tablet 12.5 mg  12.5 mg Oral BID Gonzella Lex, MD   12.5 mg at 01/07/15 4970  . nicotine (NICODERM CQ - dosed in mg/24 hours) patch 21 mg  21 mg Transdermal Daily Gonzella Lex, MD   21 mg at 01/07/15 0936  . pentafluoroprop-tetrafluoroeth (GEBAUERS) aerosol 1 application  1 application Topical PRN Munsoor Lateef, MD      . traMADol (ULTRAM) tablet 100 mg  100 mg Oral Q12H PRN Raekwan Spelman B Kathrina Crosley, MD      . ziprasidone (GEODON) capsule 80 mg  80 mg Oral BID WC Karman Biswell B Eliyah Mcshea, MD   80 mg at 01/07/15 0937  . ziprasidone (GEODON) injection 20 mg  20 mg Intramuscular Once Clovis Fredrickson, MD   20 mg at 01/06/15 1718  . ziprasidone (GEODON) injection 20 mg  20 mg Intramuscular Daily PRN Clovis Fredrickson, MD        Lab Results:  Results for orders placed or performed during the hospital encounter of 01/05/15 (from the past 48 hour(s))  Hepatitis B surface antigen     Status: None   Collection Time: 01/06/15  6:05 PM  Result  Value Ref Range   Hepatitis B Surface Ag Negative Negative    Comment: (NOTE) Performed At: [facility] [place], [place] [id number] Lindon Romp MD [id number]   Hepatitis B surface antibody     Status: None   Collection Time: 01/06/15  6:11 PM  Result Value Ref Range   Hepatitis B-Post 133.9 Immunity>9.9 mIU/mL    Comment: (NOTE)  Status of Immunity                     Anti-HBs Level  ------------------                     -------------- Inconsistent with Immunity                   0.0 - 9.9 Consistent with Immunity                          >9.9 Performed At: [facility] [place], [place] [id number] Lindon Romp MD [id number]   Parathyroid hormone, intact (no Ca)     Status: Abnormal   Collection Time: 01/06/15  6:11 PM  Result Value Ref Range   PTH 460 (H) 15 - 65 pg/mL    Comment: (NOTE) Performed At: [facility] [place], [place] [id number] Lindon Romp MD [id number]   Phosphorus     Status: None   Collection Time: 01/06/15  7:00 PM  Result Value Ref Range   Phosphorus 4.4 2.5 - 4.6 mg/dL    Physical Findings: AIMS: Facial and Oral Movements Muscles of Facial Expression: Minimal Lips and Perioral Area: Minimal Jaw: None, normal Tongue: None, normal,Extremity Movements Upper (arms, wrists, hands, fingers): None, normal Lower (legs, knees, ankles, toes): None, normal, Trunk Movements Neck, shoulders, hips: None, normal, Overall Severity Severity of abnormal movements (highest score from questions  above): None, normal Incapacitation due to abnormal movements: None, normal Patient's awareness of abnormal movements (rate only patient's report): No Awareness, Dental Status Current problems with teeth and/or dentures?: Yes Does patient usually wear dentures?: Yes  CIWA:  CIWA-Ar Total: 0 COWS:  COWS Total Score: 0  Treatment Plan Summary: Daily contact with patient to assess and  evaluate symptoms and progress in treatment and Medication management   Medical Decision Making:  Established Problem, Stable/Improving (1), Review of Psycho-Social Stressors (1), Review or order clinical lab tests (1), Review of Medication Regimen & Side Effects (2) and Review of New Medication or Change in Dosage (2)   Miss Neiswonger is a 54 year old female with a history of bipolar disorder on dialysis admitted for manic, psychotic episodes in the context of medication noncompliance.  1. Mood and psychosis. The patient was restarted on Geodon. She is allergic to many medications. We will continue Geodon for now. We will add Ativan 1 mg every 4 hours as needed for anxiety and agitation. Doxepin is used to address insomnia. She was on Depakote in the community even though is listed as allergy. She thinks that Depakote no longer with works and would like to try another medication. Lithium was working well for her in the past.  2. ESRD. The patient should be on dialysis Tuesday, Thursday, Saturday. Her last dialysis was on the 20th. She reportedly has refused her medications and dialysis since. Nephrology input is greatly appreciated.    3. Hypertension. She is on metoprolol.  4. Smoking. Nicotine patch is available.  5. Pain. We started Tramadol.   6. Disposition. She will likely return to home with her daughter. Follow-up with her regular psychiatrist.     Orson Slick 01/07/2015, 11:29 AM

## 2015-01-07 NOTE — BHH Group Notes (Signed)
[facility] LCSW Aftercare Discharge Planning Group Note   01/07/2015 3:28 PM  Participation Quality:  Did not attend Group   Keene Breath, MSW, LCSWA

## 2015-01-08 LAB — PHOSPHORUS: Phosphorus: 3.6 mg/dL (ref 2.5–4.6)

## 2015-01-08 MED ORDER — ZIPRASIDONE MESYLATE 20 MG IM SOLR
20.0000 mg | Freq: Every day | INTRAMUSCULAR | Status: DC | PRN
  Administered 2015-01-12 – 2015-01-13 (×2): 20 mg via INTRAMUSCULAR
  Filled 2015-01-08 (×2): qty 20

## 2015-01-08 NOTE — Progress Notes (Signed)
Pt returned from Dialysis at this time.  According to Dialysis RN pt stated that Her cannulation site was "hurting" and wanted to sign off.

## 2015-01-08 NOTE — Progress Notes (Signed)
[facility]  ROUNDING NOTE   Subjective:  Pt was brought down to dialysis suite. However shortly after dialysis started she came off as she states needles were hurting. They were repositioned prior to this. Pt was moving quite a bit during this short duration.   Objective:  Vital signs in last 24 hours:  Temp:  [97.5 F (36.4 C)] 97.5 F (36.4 C) (09/01 0500) Pulse Rate:  [76-87] 87 (09/01 0500) Resp:  [20] 20 (09/01 0500) BP: (104-147)/(73-93) 147/93 mmHg (09/01 0500)  Weight change:  Filed Weights   01/06/15 1740 01/06/15 2006  Weight: 89 kg (196 lb 3.4 oz) 88 kg (194 lb 0.1 oz)    Intake/Output: I/O last 3 completed shifts: In: 480 [P.O.:480] Out: 1000 [Other:1000]   Intake/Output this shift:  Total I/O In: 240 [P.O.:240] Out: -   Physical Exam: General: Slightly agitated  Head: Normocephalic, atraumatic. Moist oral mucosal membranes  Eyes: Anicteric  Neck: Supple, trachea midline  Lungs:  Clear to auscultation normal effort  Heart: Regular rate and rhythm  Abdomen:  Soft, nontender, BS present  Extremities:  trace peripheral edema.  Neurologic: Nonfocal, moving all four extremities  Skin: No lesions  Access: LUE AVF    Basic Metabolic Panel:  Recent Labs Lab 01/03/15 1355 01/04/15 1004 01/05/15 0749 01/06/15 1900  NA 139 140 142  --   K 5.0 5.0 4.8  --   CL 113* 114* 112*  --   CO2 19*  --   --   --   GLUCOSE 80 108* 93  --   BUN 48* 57* 46*  --   CREATININE 4.23* 3.90* 3.60*  --   CALCIUM 9.6  --   --   --   PHOS  --   --   --  4.4    Liver Function Tests:  Recent Labs Lab 01/03/15 1355  AST 14*  ALT 10*  ALKPHOS 107  BILITOT 0.6  PROT 6.8  ALBUMIN 3.0*   No results for input(s): LIPASE, AMYLASE in the last 168 hours. No results for input(s): AMMONIA in the last 168 hours.  CBC:  Recent Labs Lab 01/03/15 1355 01/04/15 1004 01/05/15 0749  WBC 3.6*  --   --   HGB 10.9* 13.9 12.2  HCT 34.8* 41.0 36.0  MCV  100.0  --   --   PLT 166  --   --     Cardiac Enzymes: No results for input(s): CKTOTAL, CKMB, CKMBINDEX, TROPONINI in the last 168 hours.  BNP: Invalid input(s): POCBNP  CBG: No results for input(s): GLUCAP in the last 168 hours.  Microbiology: Results for orders placed or performed during the hospital encounter of 07/29/14  MRSA PCR Screening     Status: None   Collection Time: 07/30/14  5:40 AM  Result Value Ref Range Status   MRSA by PCR NEGATIVE NEGATIVE Final    Comment:        The GeneXpert MRSA Assay (FDA approved for NASAL specimens only), is one component of a comprehensive MRSA colonization surveillance program. It is not intended to diagnose MRSA infection nor to guide or monitor treatment for MRSA infections.     Coagulation Studies: No results for input(s): LABPROT, INR in the last 72 hours.  Urinalysis: No results for input(s): COLORURINE, LABSPEC, PHURINE, GLUCOSEU, HGBUR, BILIRUBINUR, KETONESUR, PROTEINUR, UROBILINOGEN, NITRITE, LEUKOCYTESUR in the last 72 hours.  Invalid input(s): APPERANCEUR    Imaging: No results found.   Medications:     .  cinacalcet  60 mg Oral Q breakfast  . divalproex  250 mg Oral 3 times per day  . doxepin  50 mg Oral QHS  . metoprolol tartrate  12.5 mg Oral BID  . nicotine  21 mg Transdermal Daily  . ziprasidone  80 mg Oral BID WC  . ziprasidone  20 mg Intramuscular Once   sodium chloride, sodium chloride, acetaminophen, alteplase, feeding supplement (NEPRO CARB STEADY), heparin, lidocaine (PF), lidocaine-prilocaine, LORazepam, pentafluoroprop-tetrafluoroeth, traMADol, ziprasidone  Assessment/ Plan:  54 y.o. female with a PMHx of ESRD on HD since 1/16 followed by [facility], COPD, secondary hyperparathyroidism, anemia of CKD, bipolar affective disorder, who was admitted to [facility] on 01/05/2015 for evaluation of depression In the setting of known bipolar disorder. She apparently has not had dialysis for the past  10 days.   1. ESRD on HD TTHS: Pt missed dialysis over a ten day peroid prior to admission. Started HD in 1/16, followed by [facility]: Pt underwent HD on Tuesday, had a very short treament today, decided to sign off because needles were hurting despite being repositioned.  Her underlying psychiatric conditioning playing some role.  She may need some mild sedation prior to treatment. Will discuss with psych.  2. Anemia of CKD: no indication for epogen at the moment.   3. Secondary hyperparathyroidism of renal origin: phos 4.4 and acceptable, PTH acceptable at 460, continue cinacalcet 60mg  po daily.  4. Hypertension:  Continue metoprolol 12.5mg  po bid.    LOS: 3 Jennifer Lawson 9/1/201610:59 AM

## 2015-01-08 NOTE — Progress Notes (Signed)
Patient is awake and alert to person.  Affect blunted.  Patient has been sulking and limited verbal interaction with staff.  Patient has had multiple requests.  Redirectable.  Cont to monitor for safety.

## 2015-01-08 NOTE — Progress Notes (Signed)
Disheveled, disorganized still, sitting in the recliner in the hallway, dismissive, reluctant to talk, fully aware of contingency regarding her discharge planning, "I have to be fully dialyzed before I can be discharged .Marland Kitchen..would you call my daughter for me?" Will continue to monitor for safety and follow POC.

## 2015-01-08 NOTE — Progress Notes (Signed)
Patient escorted by MHT and security to dialysis at this tim.e

## 2015-01-08 NOTE — BHH Group Notes (Signed)
[facility] LCSW Group Therapy  01/08/2015 2:30 PM  Type of Therapy:  Group Therapy  Participation Level:  Did Not Attend  Keene Breath, MSW, LCSWA 01/08/2015, 2:30 PM

## 2015-01-08 NOTE — Progress Notes (Signed)
D: Patient denies SI/HI/AVH. Patient affect is intrusive, impulsive, and anxious.  Patient states, "I'm ready to go."  Patient is demanding with staff. Patient did attend evening group. Patient visible on the milieu. No distress noted. A: Support and encouragement offered. Scheduled medications given to pt. Q 15 min checks continued for patient safety. R: Patient receptive. Patient remains safe on the unit.

## 2015-01-08 NOTE — Progress Notes (Signed)
[facility] MD Progress Note  01/08/2015 11:16 AM BRODIE SCOVELL  MRN:  [id number]  Subjective:  Ms. Koman denies any symptoms of depression, anxiety, or psychosis. She however seems disorganized preoccupied with discharge. She believes that she is needed at home to care for her 55-year-old grandson. I spoke with her daughter extensively. There is no need for patient to rush home. She accepts medications and tolerates them well however she did not complete her dialysis today again. Went there for 30 minutes complaint of pain. This seems to be a long-standing problem in the community as well in the hospital. We will leave Geodon 20 mg IM prior to each treatment. She may need additional benzos or pain medication as well   Principal Problem: Bipolar affective disorder, mixed, severe, with psychotic behavior Diagnosis:   Patient Active Problem List   Diagnosis Date Noted  . Bipolar affective disorder, mixed, severe, with psychotic behavior [F31.64] 01/05/2015  . Pain in the chest [R07.9]   . Pleuritic chest pain [R07.81] 07/31/2014  . Symptomatic anemia [D64.9] 07/29/2014  . Absolute anemia [D64.9]   . CKD (chronic kidney disease) stage V requiring chronic dialysis [N18.6, Z99.2] 05/13/2014  . Salicylate overdose [P10.258N] 05/13/2014  . ESRD (end stage renal disease) [N18.6] 01/16/2014  . MDD (major depressive disorder), recurrent, severe, with psychosis [F33.3] 09/29/2013  . COPD (chronic obstructive pulmonary disease) [J44.9] 09/26/2013  . COPD exacerbation [J44.1] 04/24/2013  . Hyperkalemia [E87.5] 04/23/2013  . Acute respiratory failure [J96.00] 04/23/2013  . Hypercalcemia [E83.52] 02/20/2013  . Hyperparathyroidism, primary [E21.0] 02/20/2013  . Tobacco use disorder [Z72.0] 11/13/2012  . Overdose of salicylate [I77.824M] 35/36/1443  . HTN (hypertension) [I10] 02/20/2012   Total Time spent with patient: 20 minutes   Past Medical History:  Past Medical History  Diagnosis Date  .  Mental disorder   . Depression   . Hypertension   . Overdose   . Tobacco use disorder 11/13/2012  . Complication of anesthesia     difficulty going to sleep  . Chronic kidney disease     06/11/13- not on dialysis  . Shortness of breath     lying down flat  . PTSD (post-traumatic stress disorder)   . Asthma   . COPD (chronic obstructive pulmonary disease)   . Heart murmur   . GERD (gastroesophageal reflux disease)   . Seizures     "passsed out"  . History of blood transfusion   . Diabetes mellitus without complication     denies    Past Surgical History  Procedure Laterality Date  . Right knee replacement      she says it was last year.  . Esophagogastroduodenoscopy Left 11/14/2012    Procedure: ESOPHAGOGASTRODUODENOSCOPY (EGD);  Surgeon: [name], MD;  Location: [facility];  Service: Endoscopy;  Laterality: Left;  . Joint replacement Right     knee  . Parathyroidectomy    . Av fistula placement Right 06/12/2013    Procedure: ARTERIOVENOUS (AV) FISTULA CREATION; RIGHT  BASILIC VEIN TRANSPOSITION with Intraoperative ultrasound;  Surgeon: Mal [name], MD;  Location: [facility] OR;  Service: Vascular;  Laterality: Right;   Family History:  Family History  Problem Relation Age of Onset  . Diabetes Mother   . Hyperlipidemia Mother   . Hypertension Mother   . Diabetes Father   . Hypertension Father   . Hyperlipidemia Father    Social History:  History  Alcohol Use No    Comment: none in over a month per daughter  History  Drug Use No    Comment: pt denied any drug use    Social History   Social History  . Marital Status: Divorced    Spouse Name: N/A  . Number of Children: N/A  . Years of Education: N/A   Social History Main Topics  . Smoking status: Current Every Day Smoker -- 2.00 packs/day for 40 years    Types: Cigarettes, Cigars  . Smokeless tobacco: Never Used  . Alcohol Use: No     Comment: none in over a month per daughter  . Drug Use: No     Comment: pt  denied any drug use  . Sexual Activity: No   Other Topics Concern  . None   Social History Narrative   Additional History:    Sleep: Good  Appetite:  Fair   Assessment:   Musculoskeletal: Strength & Muscle Tone: within normal limits Gait & Station: normal Patient leans: N/A   Psychiatric Specialty Exam: Physical Exam  Nursing note and vitals reviewed.   Review of Systems  Musculoskeletal: Positive for myalgias.  All other systems reviewed and are negative.   Blood pressure 147/93, pulse 87, temperature 97.5 F (36.4 C), temperature source Oral, resp. rate 20, weight 88 kg (194 lb 0.1 oz), last menstrual period 05/09/2008, SpO2 100 %.Body mass index is 35.47 kg/(m^2).  General Appearance: Disheveled  Eye Sport and exercise psychologist::  Fair  Speech:  Clear and Coherent  Volume:  Normal  Mood:  Anxious  Affect:  Inappropriate  Thought Process:  Linear  Orientation:  Full (Time, Place, and Person)  Thought Content:  Delusions and Paranoid Ideation  Suicidal Thoughts:  No  Homicidal Thoughts:  No  Memory:  Immediate;   Fair Recent;   Fair Remote;   Fair  Judgement:  Poor  Insight:  Lacking  Psychomotor Activity:  Normal  Concentration:  Fair  Recall:  [employer] of Knowledge:Fair  Language: Fair  Akathisia:  No  Handed:  Right  AIMS (if indicated):     Assets:  Communication Skills Desire for Improvement Financial Resources/Insurance Housing Social Support  ADL's:  Intact  Cognition: WNL  Sleep:  Number of Hours: 8.25     Current Medications: Current Facility-Administered Medications  Medication Dose Route Frequency Provider Last Rate Last Dose  . 0.9 %  sodium chloride infusion  100 mL Intravenous PRN Munsoor Lateef, MD      . 0.9 %  sodium chloride infusion  100 mL Intravenous PRN Munsoor Lateef, MD      . acetaminophen (TYLENOL) tablet 650 mg  650 mg Oral Q4H PRN Gonzella Lex, MD   650 mg at 01/08/15 0859  . alteplase (CATHFLO ACTIVASE) injection 2 mg  2 mg  Intracatheter Once PRN Munsoor Lateef, MD      . cinacalcet (SENSIPAR) tablet 60 mg  60 mg Oral Q breakfast Gonzella Lex, MD   60 mg at 01/07/15 7673  . divalproex (DEPAKOTE) DR tablet 250 mg  250 mg Oral 3 times per day Clovis Fredrickson, MD   250 mg at 01/08/15 0643  . doxepin (SINEQUAN) capsule 50 mg  50 mg Oral QHS Clovis Fredrickson, MD   50 mg at 01/07/15 2135  . feeding supplement (NEPRO CARB STEADY) liquid 237 mL  237 mL Oral PRN Munsoor Lateef, MD      . heparin injection 1,000 Units  1,000 Units Dialysis PRN Munsoor Lateef, MD      . lidocaine (PF) (XYLOCAINE) 1 % injection  5 mL  5 mL Intradermal PRN Munsoor Lateef, MD      . lidocaine-prilocaine (EMLA) cream 1 application  1 application Topical PRN Munsoor Lateef, MD      . LORazepam (ATIVAN) tablet 1 mg  1 mg Oral Q4H PRN Clovis Fredrickson, MD   1 mg at 01/07/15 2135  . metoprolol tartrate (LOPRESSOR) tablet 12.5 mg  12.5 mg Oral BID Gonzella Lex, MD   12.5 mg at 01/07/15 2132  . nicotine (NICODERM CQ - dosed in mg/24 hours) patch 21 mg  21 mg Transdermal Daily Gonzella Lex, MD   21 mg at 01/07/15 0936  . pentafluoroprop-tetrafluoroeth (GEBAUERS) aerosol 1 application  1 application Topical PRN Munsoor Lateef, MD      . traMADol (ULTRAM) tablet 100 mg  100 mg Oral Q12H PRN Jolanta B Pucilowska, MD      . ziprasidone (GEODON) capsule 80 mg  80 mg Oral BID WC Jolanta B Pucilowska, MD   80 mg at 01/07/15 1725  . ziprasidone (GEODON) injection 20 mg  20 mg Intramuscular Daily PRN Clovis Fredrickson, MD        Lab Results:  Results for orders placed or performed during the hospital encounter of 01/05/15 (from the past 48 hour(s))  Hepatitis B surface antigen     Status: None   Collection Time: 01/06/15  6:05 PM  Result Value Ref Range   Hepatitis B Surface Ag Negative Negative    Comment: (NOTE) Performed At: [facility] [place], [place] [id number] Lindon Romp MD [id number]    Hepatitis B surface antibody     Status: None   Collection Time: 01/06/15  6:11 PM  Result Value Ref Range   Hepatitis B-Post 133.9 Immunity>9.9 mIU/mL    Comment: (NOTE)  Status of Immunity                     Anti-HBs Level  ------------------                     -------------- Inconsistent with Immunity                   0.0 - 9.9 Consistent with Immunity                          >9.9 Performed At: [facility] [place], [place] [id number] Lindon Romp MD [id number]   Parathyroid hormone, intact (no Ca)     Status: Abnormal   Collection Time: 01/06/15  6:11 PM  Result Value Ref Range   PTH 460 (H) 15 - 65 pg/mL    Comment: (NOTE) Performed At: [facility] [place], [place] [id number] Lindon Romp MD [id number]   Phosphorus     Status: None   Collection Time: 01/06/15  7:00 PM  Result Value Ref Range   Phosphorus 4.4 2.5 - 4.6 mg/dL    Physical Findings: AIMS: Facial and Oral Movements Muscles of Facial Expression: Minimal Lips and Perioral Area: Minimal Jaw: None, normal Tongue: None, normal,Extremity Movements Upper (arms, wrists, hands, fingers): None, normal Lower (legs, knees, ankles, toes): None, normal, Trunk Movements Neck, shoulders, hips: None, normal, Overall Severity Severity of abnormal movements (highest score from questions above): None, normal Incapacitation due to abnormal movements: None, normal Patient's awareness of abnormal movements (rate only patient's report): No Awareness, Dental Status Current problems with teeth and/or dentures?:  Yes Does patient usually wear dentures?: Yes  CIWA:  CIWA-Ar Total: 0 COWS:  COWS Total Score: 0  Treatment Plan Summary: Daily contact with patient to assess and evaluate symptoms and progress in treatment and Medication management   Medical Decision Making:  Established Problem, Stable/Improving (1), Review of Psycho-Social Stressors (1), Review or  order clinical lab tests (1) and Discuss test with performing physician (1)   Miss Quilling is a 54 year old female with a history of bipolar disorder, on dialysis, admitted for manic, psychotic episode in the context of medication noncompliance.  1. Mood and psychosis. The patient was restarted on Geodon and Depakote for mood stabilization. She is allergic to many medications.   2. ESRD. The patient should be on dialysis Tuesday, Thursday, Saturday. Her last dialysis was on the 20th. She is not compliant with treatment here. We offer im Geodon prior to dialysis.   3. Hypertension. She is on metoprolol.  4. Smoking. Nicotine patch is available.  5. Pain. We started Tramadol.   6. Disposition. She will return to home with her daughter. Follow-up with her regular psychiatrist.     Orson Slick 01/08/2015, 11:16 AM

## 2015-01-09 LAB — VALPROIC ACID LEVEL: Valproic Acid Lvl: 13 ug/mL — ABNORMAL LOW (ref 50.0–100.0)

## 2015-01-09 LAB — RENAL FUNCTION PANEL
Albumin: 3.1 g/dL — ABNORMAL LOW (ref 3.5–5.0)
Anion gap: 4 — ABNORMAL LOW (ref 5–15)
BUN: 38 mg/dL — ABNORMAL HIGH (ref 6–20)
CO2: 24 mmol/L (ref 22–32)
Calcium: 9.3 mg/dL (ref 8.9–10.3)
Chloride: 110 mmol/L (ref 101–111)
Creatinine, Ser: 3.86 mg/dL — ABNORMAL HIGH (ref 0.44–1.00)
GFR calc Af Amer: 14 mL/min — ABNORMAL LOW (ref >60)
GFR calc non Af Amer: 12 mL/min — ABNORMAL LOW (ref >60)
Glucose, Bld: 139 mg/dL — ABNORMAL HIGH (ref 65–99)
Phosphorus: 4.1 mg/dL (ref 2.5–4.6)
Potassium: 5 mmol/L (ref 3.5–5.1)
Sodium: 138 mmol/L (ref 135–145)

## 2015-01-09 LAB — LIPID PANEL
Cholesterol: 167 mg/dL (ref 0–200)
HDL: 57 mg/dL (ref >40)
LDL Cholesterol: 99 mg/dL (ref 0–99)
Total CHOL/HDL Ratio: 2.9 RATIO
Triglycerides: 55 mg/dL (ref <150)
VLDL: 11 mg/dL (ref 0–40)

## 2015-01-09 LAB — TSH: TSH: 0.647 u[IU]/mL (ref 0.350–4.500)

## 2015-01-09 LAB — HEMOGLOBIN A1C: Hgb A1c MFr Bld: 4.3 % (ref 4.0–6.0)

## 2015-01-09 MED ORDER — DIVALPROEX SODIUM 500 MG PO DR TAB
500.0000 mg | DELAYED_RELEASE_TABLET | Freq: Three times a day (TID) | ORAL | Status: DC
  Administered 2015-01-09 – 2015-01-13 (×13): 500 mg via ORAL
  Filled 2015-01-09 (×16): qty 1

## 2015-01-09 MED ORDER — LORAZEPAM 2 MG/ML IJ SOLN: 2.0000 mg | Freq: Once | INTRAMUSCULAR | Status: DC

## 2015-01-09 MED ORDER — LORAZEPAM 2 MG/ML IJ SOLN
3.0000 mg | Freq: Once | INTRAMUSCULAR | Status: AC
  Administered 2015-01-09: 3 mg via INTRAVENOUS
  Filled 2015-01-09: qty 2

## 2015-01-09 MED ORDER — ZIPRASIDONE MESYLATE 20 MG IM SOLR
20.0000 mg | Freq: Once | INTRAMUSCULAR | Status: AC
  Administered 2015-01-09: 20 mg via INTRAMUSCULAR

## 2015-01-09 NOTE — Progress Notes (Signed)
[facility] MD Progress Note  01/09/2015 11:21 AM Jennifer Lawson  MRN:  [id number]  Subjective: Jennifer Lawson is a 54 year old female with a history of bipolar disorder on dialysis who was admitted for psychotic symptoms in the context of treatment noncompliance. She refused dialysis for 10 days prior to coming to the hospital. In the hospital she only received partial treatment due to agitation. I spoke to Dr. Zollie Scale today. We will premedicate the patient with 20 mg of IM Geodon. This will be followed by 2 or 3 mg of Ativan once she is hooked up to the machine.   Jennifer Lawson is still rather paranoid. She is in despair. She believes that her disability and health insurance disappeared. She is begging to be discharged to home. She believes that she needs to babysit her 38-year-old grandson. This is not true. She reports no somatic symptoms except for pain during direct dialysis. She seems to tolerate medications well but apparently her Depakote dose of 750 mg a day is too low (VPA 13). Will increase to 500 mg 3 times a day and recheck the level. Sleep and appetite are okay.  Principal Problem: Bipolar affective disorder, mixed, severe, with psychotic behavior Diagnosis:   Patient Active Problem List   Diagnosis Date Noted  . Bipolar affective disorder, mixed, severe, with psychotic behavior [F31.64] 01/05/2015  . Pain in the chest [R07.9]   . Pleuritic chest pain [R07.81] 07/31/2014  . Symptomatic anemia [D64.9] 07/29/2014  . Absolute anemia [D64.9]   . CKD (chronic kidney disease) stage V requiring chronic dialysis [N18.6, Z99.2] 05/13/2014  . Salicylate overdose [T51.761Y] 05/13/2014  . ESRD (end stage renal disease) [N18.6] 01/16/2014  . MDD (major depressive disorder), recurrent, severe, with psychosis [F33.3] 09/29/2013  . COPD (chronic obstructive pulmonary disease) [J44.9] 09/26/2013  . COPD exacerbation [J44.1] 04/24/2013  . Hyperkalemia [E87.5] 04/23/2013  . Acute respiratory failure  [J96.00] 04/23/2013  . Hypercalcemia [E83.52] 02/20/2013  . Hyperparathyroidism, primary [E21.0] 02/20/2013  . Tobacco use disorder [Z72.0] 11/13/2012  . Overdose of salicylate [W73.710G] 26/94/8546  . HTN (hypertension) [I10] 02/20/2012   Total Time spent with patient: 20 minutes   Past Medical History:  Past Medical History  Diagnosis Date  . Mental disorder   . Depression   . Hypertension   . Overdose   . Tobacco use disorder 11/13/2012  . Complication of anesthesia     difficulty going to sleep  . Chronic kidney disease     06/11/13- not on dialysis  . Shortness of breath     lying down flat  . PTSD (post-traumatic stress disorder)   . Asthma   . COPD (chronic obstructive pulmonary disease)   . Heart murmur   . GERD (gastroesophageal reflux disease)   . Seizures     "passsed out"  . History of blood transfusion   . Diabetes mellitus without complication     denies    Past Surgical History  Procedure Laterality Date  . Right knee replacement      she says it was last year.  . Esophagogastroduodenoscopy Left 11/14/2012    Procedure: ESOPHAGOGASTRODUODENOSCOPY (EGD);  Surgeon: [name], MD;  Location: [facility];  Service: Endoscopy;  Laterality: Left;  . Joint replacement Right     knee  . Parathyroidectomy    . Av fistula placement Right 06/12/2013    Procedure: ARTERIOVENOUS (AV) FISTULA CREATION; RIGHT  BASILIC VEIN TRANSPOSITION with Intraoperative ultrasound;  Surgeon: Mal [name], MD;  Location: [place];  Service: Vascular;  Laterality: Right;   Family History:  Family History  Problem Relation Age of Onset  . Diabetes Mother   . Hyperlipidemia Mother   . Hypertension Mother   . Diabetes Father   . Hypertension Father   . Hyperlipidemia Father    Social History:  History  Alcohol Use No    Comment: none in over a month per daughter     History  Drug Use No    Comment: pt denied any drug use    Social History   Social History  . Marital  Status: Divorced    Spouse Name: N/A  . Number of Children: N/A  . Years of Education: N/A   Social History Main Topics  . Smoking status: Current Every Day Smoker -- 2.00 packs/day for 40 years    Types: Cigarettes, Cigars  . Smokeless tobacco: Never Used  . Alcohol Use: No     Comment: none in over a month per daughter  . Drug Use: No     Comment: pt denied any drug use  . Sexual Activity: No   Other Topics Concern  . None   Social History Narrative   Additional History:    Sleep: Good  Appetite:  Fair   Assessment:   Musculoskeletal: Strength & Muscle Tone: within normal limits Gait & Station: normal Patient leans: N/A   Psychiatric Specialty Exam: Physical Exam  Nursing note and vitals reviewed.   Review of Systems  All other systems reviewed and are negative.   Blood pressure 118/67, pulse 79, temperature 98.7 F (37.1 C), temperature source Oral, resp. rate 20, weight 89.8 kg (197 lb 15.6 oz), last menstrual period 05/09/2008, SpO2 100 %.Body mass index is 36.2 kg/(m^2).  General Appearance: Bizarre  Eye Contact::  Fair  Speech:  Clear and Coherent  Volume:  Normal  Mood:  Anxious  Affect:  Labile  Thought Process:  Irrelevant  Orientation:  Full (Time, Place, and Person)  Thought Content:  Delusions, Hallucinations: Auditory and Paranoid Ideation  Suicidal Thoughts:  No  Homicidal Thoughts:  No  Memory:  Immediate;   Fair Recent;   Fair Remote;   Fair  Judgement:  Poor  Insight:  Lacking  Psychomotor Activity:  Increased  Concentration:  Fair  Recall:  [employer] of Knowledge:Fair  Language: Fair  Akathisia:  No  Handed:  Right  AIMS (if indicated):     Assets:  Communication Skills Desire for Improvement Financial Resources/Insurance Housing Social Support  ADL's:  Intact  Cognition: WNL  Sleep:  Number of Hours: 5.5     Current Medications: Current Facility-Administered Medications  Medication Dose Route Frequency Provider Last  Rate Last Dose  . 0.9 %  sodium chloride infusion  100 mL Intravenous PRN Munsoor Lateef, MD      . 0.9 %  sodium chloride infusion  100 mL Intravenous PRN Munsoor Lateef, MD      . acetaminophen (TYLENOL) tablet 650 mg  650 mg Oral Q4H PRN Gonzella Lex, MD   650 mg at 01/09/15 0027  . alteplase (CATHFLO ACTIVASE) injection 2 mg  2 mg Intracatheter Once PRN Munsoor Lateef, MD      . cinacalcet (SENSIPAR) tablet 60 mg  60 mg Oral Q breakfast Gonzella Lex, MD   60 mg at 01/09/15 0935  . divalproex (DEPAKOTE) DR tablet 250 mg  250 mg Oral 3 times per day Clovis Fredrickson, MD   250 mg at 01/09/15 0645  . doxepin (SINEQUAN)  capsule 50 mg  50 mg Oral QHS Clovis Fredrickson, MD   50 mg at 01/08/15 2109  . feeding supplement (NEPRO CARB STEADY) liquid 237 mL  237 mL Oral PRN Munsoor Lateef, MD      . heparin injection 1,000 Units  1,000 Units Dialysis PRN Munsoor Lateef, MD      . lidocaine (PF) (XYLOCAINE) 1 % injection 5 mL  5 mL Intradermal PRN Munsoor Lateef, MD      . lidocaine-prilocaine (EMLA) cream 1 application  1 application Topical PRN Munsoor Lateef, MD      . LORazepam (ATIVAN) tablet 1 mg  1 mg Oral Q4H PRN Clovis Fredrickson, MD   1 mg at 01/09/15 0027  . metoprolol tartrate (LOPRESSOR) tablet 12.5 mg  12.5 mg Oral BID Gonzella Lex, MD   12.5 mg at 01/09/15 0935  . nicotine (NICODERM CQ - dosed in mg/24 hours) patch 21 mg  21 mg Transdermal Daily Gonzella Lex, MD   21 mg at 01/07/15 0936  . pentafluoroprop-tetrafluoroeth (GEBAUERS) aerosol 1 application  1 application Topical PRN Munsoor Lateef, MD      . traMADol (ULTRAM) tablet 100 mg  100 mg Oral Q12H PRN Jadarius Commons B Aaronjames Kelsay, MD      . ziprasidone (GEODON) capsule 80 mg  80 mg Oral BID WC Clovis Fredrickson, MD   80 mg at 01/09/15 0935  . ziprasidone (GEODON) injection 20 mg  20 mg Intramuscular Daily PRN Clovis Fredrickson, MD        Lab Results:  Results for orders placed or performed during the hospital  encounter of 01/05/15 (from the past 48 hour(s))  Phosphorus     Status: None   Collection Time: 01/08/15  4:35 PM  Result Value Ref Range   Phosphorus 3.6 2.5 - 4.6 mg/dL  Valproic acid level     Status: Abnormal   Collection Time: 01/09/15  9:25 AM  Result Value Ref Range   Valproic Acid Lvl 13 (L) 50.0 - 100.0 ug/mL  Lipid panel     Status: None   Collection Time: 01/09/15  9:25 AM  Result Value Ref Range   Cholesterol 167 0 - 200 mg/dL   Triglycerides 55 <150 mg/dL   HDL 57 >40 mg/dL   Total CHOL/HDL Ratio 2.9 RATIO   VLDL 11 0 - 40 mg/dL   LDL Cholesterol 99 0 - 99 mg/dL    Comment:        Total Cholesterol/HDL:CHD Risk Coronary Heart Disease Risk Table                     Men   Women  1/2 Average Risk   3.4   3.3  Average Risk       5.0   4.4  2 X Average Risk   9.6   7.1  3 X Average Risk  23.4   11.0        Use the calculated Patient Ratio above and the CHD Risk Table to determine the patient's CHD Risk.        ATP III CLASSIFICATION (LDL):  <100     mg/dL   Optimal  100-129  mg/dL   Near or Above                    Optimal  130-159  mg/dL   Borderline  160-189  mg/dL   High  >190     mg/dL   Very  High   TSH     Status: None   Collection Time: 01/09/15  9:25 AM  Result Value Ref Range   TSH 0.647 0.350 - 4.500 uIU/mL    Physical Findings: AIMS: Facial and Oral Movements Muscles of Facial Expression: Minimal Lips and Perioral Area: Minimal Jaw: None, normal Tongue: None, normal,Extremity Movements Upper (arms, wrists, hands, fingers): None, normal Lower (legs, knees, ankles, toes): None, normal, Trunk Movements Neck, shoulders, hips: None, normal, Overall Severity Severity of abnormal movements (highest score from questions above): None, normal Incapacitation due to abnormal movements: None, normal Patient's awareness of abnormal movements (rate only patient's report): No Awareness, Dental Status Current problems with teeth and/or dentures?: Yes Does  patient usually wear dentures?: Yes  CIWA:  CIWA-Ar Total: 0 COWS:  COWS Total Score: 0  Treatment Plan Summary: Daily contact with patient to assess and evaluate symptoms and progress in treatment and Medication management   Medical Decision Making:  Established Problem, Stable/Improving (1), Review of Psycho-Social Stressors (1), Review or order clinical lab tests (1), Review of Medication Regimen & Side Effects (2) and Review of New Medication or Change in Dosage (2)   Jennifer Lawson is a 54 year old female with a history of bipolar disorder, on dialysis, admitted for manic, psychotic episode in the context of medication noncompliance.  1. Mood and psychosis. The patient was restarted on Geodon 80 mg bid and Depakote 250 mg tid for mood stabilization. She is allergic to many medications. Will increase Depakote to 500 mg tid.  2. ESRD. The patient should be on dialysis Tuesday, Thursday, Saturday. Her last dialysis was on the 20th. She is not compliant with treatment here. We offer im Geodon prior to dialysis.   3. Hypertension. She is on metoprolol.  4. Smoking. Nicotine patch is available.  5. Pain. We started Tramadol.   6. Disposition. She will return to home with her daughter. Follow-up with her regular psychiatrist.        Orson Slick 01/09/2015, 11:21 AM

## 2015-01-09 NOTE — [facility] Group Notes (Signed)
[facility] Notes:  (Nursing/MHT/Case Management/Adjunct)  Date:  01/09/2015  Time:  11:56 AM  Type of Therapy:  Psychoeducational Skills  Participation Level:  Did Not Attend    Drake Leach 01/09/2015, 11:56 AM

## 2015-01-09 NOTE — Progress Notes (Signed)
Pre-hd tx 

## 2015-01-09 NOTE — Progress Notes (Signed)
HD tx completed.

## 2015-01-09 NOTE — Progress Notes (Signed)
Patient was depressed & irritable this morning.Compliant with meds.Did not attend groups.tolerated dialysis today.More cooperative on approach now.Denies suicidal ideation.No distress verbalized.

## 2015-01-09 NOTE — Progress Notes (Signed)
Post hd tx 

## 2015-01-09 NOTE — Progress Notes (Signed)
HD tx start 

## 2015-01-09 NOTE — Progress Notes (Signed)
Patient ID: Jennifer Lawson, female   DOB: 01-29-1961, 54 y.o.   MRN: [id number]  CSW was unable to complete assessment due to presentation and dialysis treatment today. CSW will attempt again tomorrow.  [place] MSW, [place]  01/09/2015 3:58 PM
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
## 2015-01-09 NOTE — Plan of Care (Signed)
Problem: Ineffective individual coping Goal: STG: Patient will remain free from self harm Outcome: Progressing Medications given as ordered ny the physician, PRNs (Tylenol 650 mg and Ativan 1 mg) given for mild pain and anxiety, remains on 15 minute checks for observation for safety.

## 2015-01-09 NOTE — Progress Notes (Signed)
[facility]  ROUNDING NOTE   Subjective:  Pt given geodon prior to HD.  Also given ativan when starting dialysis.   Objective:  Vital signs in last 24 hours:  Temp:  [98.4 F (36.9 C)-98.7 F (37.1 C)] 98.4 F (36.9 C) (09/02 1410) Pulse Rate:  [78-79] 78 (09/02 1500) Resp:  [16-20] 16 (09/02 1500) BP: (115-144)/(67-84) 115/83 mmHg (09/02 1500) SpO2:  [98 %-99 %] 99 % (09/02 1500) Weight:  [88.7 kg (195 lb 8.8 oz)] 88.7 kg (195 lb 8.8 oz) (09/02 1410)  Weight change:  Filed Weights   01/06/15 2006 01/08/15 0900 01/09/15 1410  Weight: 88 kg (194 lb 0.1 oz) 89.8 kg (197 lb 15.6 oz) 88.7 kg (195 lb 8.8 oz)    Intake/Output: I/O last 3 completed shifts: In: 840 [P.O.:840] Out: 68 [Other:80]   Intake/Output this shift:  Total I/O In: 240 [P.O.:240] Out: -   Physical Exam: General: No acute distress  Head: Normocephalic, atraumatic. Moist oral mucosal membranes  Eyes: Anicteric  Neck: Supple, trachea midline  Lungs:  Clear to auscultation normal effort  Heart: Regular rate and rhythm  Abdomen:  Soft, nontender, BS present  Extremities:  trace peripheral edema.  Neurologic: Nonfocal, moving all four extremities  Skin: No lesions  Access: LUE AVF    Basic Metabolic Panel:  Recent Labs Lab 01/03/15 1355 01/04/15 1004 01/05/15 0749 01/06/15 1900 01/08/15 1635 01/09/15 1448  NA 139 140 142  --   --  138  K 5.0 5.0 4.8  --   --  5.0  CL 113* 114* 112*  --   --  110  CO2 19*  --   --   --   --  24  GLUCOSE 80 108* 93  --   --  139*  BUN 48* 57* 46*  --   --  38*  CREATININE 4.23* 3.90* 3.60*  --   --  3.86*  CALCIUM 9.6  --   --   --   --  9.3  PHOS  --   --   --  4.4 3.6 4.1    Liver Function Tests:  Recent Labs Lab 01/03/15 1355 01/09/15 1448  AST 14*  --   ALT 10*  --   ALKPHOS 107  --   BILITOT 0.6  --   PROT 6.8  --   ALBUMIN 3.0* 3.1*   No results for input(s): LIPASE, AMYLASE in the last 168 hours. No results for input(s):  AMMONIA in the last 168 hours.  CBC:  Recent Labs Lab 01/03/15 1355 01/04/15 1004 01/05/15 0749  WBC 3.6*  --   --   HGB 10.9* 13.9 12.2  HCT 34.8* 41.0 36.0  MCV 100.0  --   --   PLT 166  --   --     Cardiac Enzymes: No results for input(s): CKTOTAL, CKMB, CKMBINDEX, TROPONINI in the last 168 hours.  BNP: Invalid input(s): POCBNP  CBG: No results for input(s): GLUCAP in the last 168 hours.  Microbiology: Results for orders placed or performed during the hospital encounter of 07/29/14  MRSA PCR Screening     Status: None   Collection Time: 07/30/14  5:40 AM  Result Value Ref Range Status   MRSA by PCR NEGATIVE NEGATIVE Final    Comment:        The GeneXpert MRSA Assay (FDA approved for NASAL specimens only), is one component of a comprehensive MRSA colonization surveillance program. It is not intended to diagnose  MRSA infection nor to guide or monitor treatment for MRSA infections.     Coagulation Studies: No results for input(s): LABPROT, INR in the last 72 hours.  Urinalysis: No results for input(s): COLORURINE, LABSPEC, PHURINE, GLUCOSEU, HGBUR, BILIRUBINUR, KETONESUR, PROTEINUR, UROBILINOGEN, NITRITE, LEUKOCYTESUR in the last 72 hours.  Invalid input(s): APPERANCEUR    Imaging: No results found.   Medications:     . cinacalcet  60 mg Oral Q breakfast  . divalproex  500 mg Oral 3 times per day  . doxepin  50 mg Oral QHS  . metoprolol tartrate  12.5 mg Oral BID  . nicotine  21 mg Transdermal Daily  . ziprasidone  80 mg Oral BID WC   sodium chloride, sodium chloride, acetaminophen, alteplase, feeding supplement (NEPRO CARB STEADY), heparin, lidocaine (PF), lidocaine-prilocaine, LORazepam, pentafluoroprop-tetrafluoroeth, traMADol, ziprasidone  Assessment/ Plan:  54 y.o. female with a PMHx of ESRD on HD since 1/16 followed by [facility], COPD, secondary hyperparathyroidism, anemia of CKD, bipolar affective disorder, who was admitted to [facility] on  01/05/2015 for evaluation of depression In the setting of known bipolar disorder. She apparently has not had dialysis for the past 10 days.   1. ESRD on HD TTHS: Pt missed dialysis over a ten day peroid prior to admission. Started HD in 1/16, followed by [facility]: Due to underlying agitation and psychiatric condition it has been difficult to dialyze pt.  However today she was given geodon in psych and 3mg  of Ativan immediately prior to running dialysis.  She has tolerated this well and is dialyzing comfortably. Next HD tomorrow.  2. Anemia of CKD: epogen held at this time.   3. Secondary hyperparathyroidism of renal origin: phos 4.1 and acceptable, continue sensipar 60mg  po dialy.  4. Hypertension:  BP 115/83, continue metoproll.     LOS: 4 Eliazar Olivar 9/2/20163:17 PM

## 2015-01-09 NOTE — BHH Group Notes (Signed)
[facility] LCSW Aftercare Discharge Planning Group Note   01/09/2015 4:36 PM  Participation Quality:  Did not attend.   [place] MSW, LCSWA

## 2015-01-09 NOTE — Progress Notes (Signed)
Recreation Therapy Notes  Date: 09.02.16 Time: 3:00 pm Location: Craft Room  Group Topic: Self-expression, coping skills  Goal Area(s) Addresses:  Patient will effectively use art as a means of self-expression. Patient will recognize positive benefit of self-expression. Patient will be able to identify one emotion experienced during group session. Patient will identify use of art as a coping skill.  Behavioral Response: Did not attend  Intervention: Two Faces of Me  Activity: Patients were given a blank face worksheet and instructed to draw a line down the middle. On one side patients were instructed to draw or write how they felt when they were admitted to the hospital and on the other side patients were instructed to draw or write how they want to feel once they are d/c.  Education: LRT educated patients on healthy coping skills.  Education Outcome: Patient did not attend group.  Clinical Observations/Feedback: Patient did not attend group.  Leonette Monarch, LRT/CTRS 01/09/2015 5:08 PM

## 2015-01-10 MED ORDER — LORAZEPAM 1 MG PO TABS
1.0000 mg | ORAL_TABLET | Freq: Three times a day (TID) | ORAL | Status: DC | PRN
  Administered 2015-01-12 – 2015-01-13 (×2): 1 mg via ORAL
  Filled 2015-01-10 (×2): qty 1

## 2015-01-10 MED ORDER — LORAZEPAM 2 MG PO TABS
2.0000 mg | ORAL_TABLET | Freq: Every day | ORAL | Status: DC
  Administered 2015-01-11 – 2015-01-12 (×3): 2 mg via ORAL
  Filled 2015-01-10 (×3): qty 1

## 2015-01-10 NOTE — Progress Notes (Signed)
[facility] MD Progress Note  01/10/2015 12:17 PM Jennifer Lawson  MRN:  [id number]  Subjective: [name] is a 54 year old female with a history of bipolar disorder on dialysis who was admitted for psychotic symptoms in the context of treatment noncompliance. She refused dialysis for 10 days prior to coming to the hospital. In the hospital she only received partial treatment due to agitation. I spoke to Dr. Zollie Scale today. We will premedicate the patient with 20 mg of IM Geodon. This will be followed by 2 or 3 mg of Ativan once she is hooked up to the machine.   Miss Jennifer Lawson is still rather paranoid, argumentative, and continues to demand to be discharged.  Patient was uncooperative with the assessment she refused to answer questions and remained quiet.  She complained that the nurses were not helping her make phone calls to her daughter "those nurses have a bad attitude". Per nursing staff patient has been giving them the wrong number and then she becomes really upset when they cannot get a hold of her daughter.  She has been asking the social worker whether or not phones are tap.  She cooperated with dialysis yesterday.  She only slept 1 hour last night.  Per nursing: Care taken over at 2300 she required frequent redirection through out the night. Patient up to nurse's station with various request for snacks, she also seemed to have some somatic complaints, she was given Ativan x1 PRN with minimal effects. She has been up back and forth to the dayroom constantly.       Per SW: asking if the phones are tap.  Principal Problem: Bipolar affective disorder, mixed, severe, with psychotic behavior Diagnosis:   Patient Active Problem List   Diagnosis Date Noted  . Bipolar affective disorder, mixed, severe, with psychotic behavior [F31.64] 01/05/2015  . Pain in the chest [R07.9]   . Pleuritic chest pain [R07.81] 07/31/2014  . Symptomatic anemia [D64.9] 07/29/2014  . Absolute anemia [D64.9]   . CKD  (chronic kidney disease) stage V requiring chronic dialysis [N18.6, Z99.2] 05/13/2014  . Salicylate overdose [L38.101B] 05/13/2014  . ESRD (end stage renal disease) [N18.6] 01/16/2014  . MDD (major depressive disorder), recurrent, severe, with psychosis [F33.3] 09/29/2013  . COPD (chronic obstructive pulmonary disease) [J44.9] 09/26/2013  . COPD exacerbation [J44.1] 04/24/2013  . Hyperkalemia [E87.5] 04/23/2013  . Acute respiratory failure [J96.00] 04/23/2013  . Hypercalcemia [E83.52] 02/20/2013  . Hyperparathyroidism, primary [E21.0] 02/20/2013  . Tobacco use disorder [Z72.0] 11/13/2012  . Overdose of salicylate [P10.258N] 27/78/2423  . HTN (hypertension) [I10] 02/20/2012   Total Time spent with patient: 30 minutes   Past Medical History:  Past Medical History  Diagnosis Date  . Mental disorder   . Depression   . Hypertension   . Overdose   . Tobacco use disorder 11/13/2012  . Complication of anesthesia     difficulty going to sleep  . Chronic kidney disease     06/11/13- not on dialysis  . Shortness of breath     lying down flat  . PTSD (post-traumatic stress disorder)   . Asthma   . COPD (chronic obstructive pulmonary disease)   . Heart murmur   . GERD (gastroesophageal reflux disease)   . Seizures     "passsed out"  . History of blood transfusion   . Diabetes mellitus without complication     denies    Past Surgical History  Procedure Laterality Date  . Right knee replacement      she  says it was last year.  . Esophagogastroduodenoscopy Left 11/14/2012    Procedure: ESOPHAGOGASTRODUODENOSCOPY (EGD);  Surgeon: [name], MD;  Location: [facility];  Service: Endoscopy;  Laterality: Left;  . Joint replacement Right     knee  . Parathyroidectomy    . Av fistula placement Right 06/12/2013    Procedure: ARTERIOVENOUS (AV) FISTULA CREATION; RIGHT  BASILIC VEIN TRANSPOSITION with Intraoperative ultrasound;  Surgeon: Mal [name], MD;  Location: [facility] OR;  Service: Vascular;   Laterality: Right;   Family History:  Family History  Problem Relation Age of Onset  . Diabetes Mother   . Hyperlipidemia Mother   . Hypertension Mother   . Diabetes Father   . Hypertension Father   . Hyperlipidemia Father    Social History:  History  Alcohol Use No    Comment: none in over a month per daughter     History  Drug Use No    Comment: pt denied any drug use    Social History   Social History  . Marital Status: Divorced    Spouse Name: N/A  . Number of Children: N/A  . Years of Education: N/A   Social History Main Topics  . Smoking status: Current Every Day Smoker -- 2.00 packs/day for 40 years    Types: Cigarettes, Cigars  . Smokeless tobacco: Never Used  . Alcohol Use: No     Comment: none in over a month per daughter  . Drug Use: No     Comment: pt denied any drug use  . Sexual Activity: No   Other Topics Concern  . None   Social History Narrative   Additional History:    Sleep: Good  Appetite:  Fair   Assessment:   Musculoskeletal: Strength & Muscle Tone: within normal limits Gait & Station: normal Patient leans: N/A   Psychiatric Specialty Exam: Physical Exam  Nursing note and vitals reviewed.   Review of Systems  Constitutional: Negative.   HENT: Negative.   Eyes: Negative.   Respiratory: Negative.   Cardiovascular: Negative.   Gastrointestinal: Negative.   Genitourinary: Negative.   Musculoskeletal: Negative.   Skin: Negative.   Neurological: Negative.   Endo/Heme/Allergies: Negative.   Psychiatric/Behavioral: Negative.   All other systems reviewed and are negative.   Blood pressure 138/90, pulse 90, temperature 98.8 F (37.1 C), temperature source Oral, resp. rate 18, weight 87.7 kg (193 lb 5.5 oz), last menstrual period 05/09/2008, SpO2 98 %.Body mass index is 35.35 kg/(m^2).  General Appearance: Bizarre  Eye Contact::  Fair  Speech:  Clear and Coherent  Volume:  Normal  Mood:  Anxious  Affect:  Labile   Thought Process:  Irrelevant  Orientation:  Full (Time, Place, and Person)  Thought Content:  Delusions, Hallucinations: Auditory and Paranoid Ideation  Suicidal Thoughts:  No  Homicidal Thoughts:  No  Memory:  Immediate;   Fair Recent;   Fair Remote;   Fair  Judgement:  Poor  Insight:  Lacking  Psychomotor Activity:  Increased  Concentration:  Fair  Recall:  [employer] of Knowledge:Fair  Language: Fair  Akathisia:  No  Handed:  Right  AIMS (if indicated):     Assets:  Communication Skills Desire for Improvement Financial Resources/Insurance Housing Social Support  ADL's:  Intact  Cognition: WNL  Sleep:  Number of Hours: 1.5     Current Medications: Current Facility-Administered Medications  Medication Dose Route Frequency Provider Last Rate Last Dose  . 0.9 %  sodium chloride infusion  100 mL Intravenous PRN Munsoor Lateef, MD      . 0.9 %  sodium chloride infusion  100 mL Intravenous PRN Munsoor Lateef, MD      . acetaminophen (TYLENOL) tablet 650 mg  650 mg Oral Q4H PRN Gonzella Lex, MD   650 mg at 01/09/15 0027  . alteplase (CATHFLO ACTIVASE) injection 2 mg  2 mg Intracatheter Once PRN Munsoor Lateef, MD      . cinacalcet (SENSIPAR) tablet 60 mg  60 mg Oral Q breakfast Gonzella Lex, MD   60 mg at 01/10/15 0837  . divalproex (DEPAKOTE) DR tablet 500 mg  500 mg Oral 3 times per day Clovis Fredrickson, MD   500 mg at 01/10/15 0651  . doxepin (SINEQUAN) capsule 50 mg  50 mg Oral QHS Clovis Fredrickson, MD   50 mg at 01/09/15 2154  . feeding supplement (NEPRO CARB STEADY) liquid 237 mL  237 mL Oral PRN Munsoor Lateef, MD      . heparin injection 1,000 Units  1,000 Units Dialysis PRN Munsoor Lateef, MD      . lidocaine (PF) (XYLOCAINE) 1 % injection 5 mL  5 mL Intradermal PRN Munsoor Lateef, MD      . lidocaine-prilocaine (EMLA) cream 1 application  1 application Topical PRN Munsoor Lateef, MD      . LORazepam (ATIVAN) tablet 1 mg  1 mg Oral Q4H PRN Clovis Fredrickson, MD   1 mg at 01/10/15 0210  . metoprolol tartrate (LOPRESSOR) tablet 12.5 mg  12.5 mg Oral BID Gonzella Lex, MD   12.5 mg at 01/10/15 2706  . nicotine (NICODERM CQ - dosed in mg/24 hours) patch 21 mg  21 mg Transdermal Daily Gonzella Lex, MD   21 mg at 01/07/15 0936  . pentafluoroprop-tetrafluoroeth (GEBAUERS) aerosol 1 application  1 application Topical PRN Munsoor Lateef, MD      . traMADol (ULTRAM) tablet 100 mg  100 mg Oral Q12H PRN Jolanta B Pucilowska, MD      . ziprasidone (GEODON) capsule 80 mg  80 mg Oral BID WC Jolanta B Pucilowska, MD   80 mg at 01/10/15 0840  . ziprasidone (GEODON) injection 20 mg  20 mg Intramuscular Daily PRN Clovis Fredrickson, MD        Lab Results:  Results for orders placed or performed during the hospital encounter of 01/05/15 (from the past 48 hour(s))  Phosphorus     Status: None   Collection Time: 01/08/15  4:35 PM  Result Value Ref Range   Phosphorus 3.6 2.5 - 4.6 mg/dL  Valproic acid level     Status: Abnormal   Collection Time: 01/09/15  9:25 AM  Result Value Ref Range   Valproic Acid Lvl 13 (L) 50.0 - 100.0 ug/mL  Lipid panel     Status: None   Collection Time: 01/09/15  9:25 AM  Result Value Ref Range   Cholesterol 167 0 - 200 mg/dL   Triglycerides 55 <150 mg/dL   HDL 57 >40 mg/dL   Total CHOL/HDL Ratio 2.9 RATIO   VLDL 11 0 - 40 mg/dL   LDL Cholesterol 99 0 - 99 mg/dL    Comment:        Total Cholesterol/HDL:CHD Risk Coronary Heart Disease Risk Table                     Men   Women  1/2 Average Risk   3.4   3.3  Average Risk       5.0   4.4  2 X Average Risk   9.6   7.1  3 X Average Risk  23.4   11.0        Use the calculated Patient Ratio above and the CHD Risk Table to determine the patient's CHD Risk.        ATP III CLASSIFICATION (LDL):  <100     mg/dL   Optimal  100-129  mg/dL   Near or Above                    Optimal  130-159  mg/dL   Borderline  160-189  mg/dL   High  >190     mg/dL   Very High    TSH     Status: None   Collection Time: 01/09/15  9:25 AM  Result Value Ref Range   TSH 0.647 0.350 - 4.500 uIU/mL  Hemoglobin A1c     Status: None   Collection Time: 01/09/15  9:25 AM  Result Value Ref Range   Hgb A1c MFr Bld 4.3 4.0 - 6.0 %  Renal function panel     Status: Abnormal   Collection Time: 01/09/15  2:48 PM  Result Value Ref Range   Sodium 138 135 - 145 mmol/L   Potassium 5.0 3.5 - 5.1 mmol/L   Chloride 110 101 - 111 mmol/L   CO2 24 22 - 32 mmol/L   Glucose, Bld 139 (H) 65 - 99 mg/dL   BUN 38 (H) 6 - 20 mg/dL   Creatinine, Ser 3.86 (H) 0.44 - 1.00 mg/dL   Calcium 9.3 8.9 - 10.3 mg/dL   Phosphorus 4.1 2.5 - 4.6 mg/dL   Albumin 3.1 (L) 3.5 - 5.0 g/dL   GFR calc non Af Amer 12 (L) >60 mL/min   GFR calc Af Amer 14 (L) >60 mL/min    Comment: (NOTE) The eGFR has been calculated using the CKD EPI equation. This calculation has not been validated in all clinical situations. eGFR's persistently <60 mL/min signify possible Chronic Kidney Disease.    Anion gap 4 (L) 5 - 15    Physical Findings: AIMS: Facial and Oral Movements Muscles of Facial Expression: Minimal Lips and Perioral Area: Minimal Jaw: None, normal Tongue: None, normal,Extremity Movements Upper (arms, wrists, hands, fingers): None, normal Lower (legs, knees, ankles, toes): None, normal, Trunk Movements Neck, shoulders, hips: None, normal, Overall Severity Severity of abnormal movements (highest score from questions above): None, normal Incapacitation due to abnormal movements: None, normal Patient's awareness of abnormal movements (rate only patient's report): No Awareness, Dental Status Current problems with teeth and/or dentures?: Yes Does patient usually wear dentures?: Yes  CIWA:  CIWA-Ar Total: 0 COWS:  COWS Total Score: 0  Treatment Plan Summary: Daily contact with patient to assess and evaluate symptoms and progress in treatment and Medication management   Medical Decision Making:   Established Problem, Stable/Improving (1), Review of Psycho-Social Stressors (1), Review or order clinical lab tests (1), Review of Medication Regimen & Side Effects (2) and Review of New Medication or Change in Dosage (2)   Miss Stammen is a 54 year old female with a history of bipolar disorder, on dialysis, admitted for manic, psychotic episode in the context of medication noncompliance.  Mood and psychosis. The patient was restarted on Geodon 80 mg bid and Depakote 250 mg tid for mood stabilization. She is allergic to many medications. Will increase Depakote to 500 mg tid.  Insomnia:  will start ativan 2 mg po qhs  Agitation/anxiety: will change ativan to 1 mg tid prn (prior order q4 h prn)   ESRD. The patient should be on dialysis Tuesday, Thursday, Saturday. Her last dialysis was on the 20th. She is not compliant with treatment here. We offer im Geodon prior to dialysis.   Hypertension. She is on metoprolol.  Smoking. Nicotine patch is available.  Pain. We started Tramadol.   Disposition. She will return to home with her daughter. Follow-up with her regular psychiatrist.        Hildred Priest 01/10/2015, 12:17 PM

## 2015-01-10 NOTE — BHH Group Notes (Signed)
[place] Group Notes:  (Nursing/MHT/Case Management/Adjunct)  Date:  01/10/2015  Time:  8:49 AM  Type of Therapy:  Group Therapy  Participation Level:  Did Not Attend  Summary of Progress/Problems:  Jennifer Lawson [name] 01/10/2015, 8:49 AM

## 2015-01-10 NOTE — Progress Notes (Signed)
Care taken over at 2300 she required frequent redirection through out the night. Patient up to nurse's station with various request for snacks, she also seemed to have some somatic complaints, she was given Ativan x1 PRN with minimal effects.  She has been up back and forth to the dayroom constantly.

## 2015-01-10 NOTE — Progress Notes (Signed)
Pt continues to need frequent redirecting. Pt has been focused on being discharged, constantly stating "when am I going home". Pt's daughter called today and requested to speak with her which was permitted . The conversation was very brief . Pt has been seclusive to her room most of the day. Pt denies SI and A/V hallucinations.

## 2015-01-10 NOTE — BHH Group Notes (Signed)
[place] LCSW Group Therapy  01/10/2015 3:09 PM  Type of Therapy:  Group Therapy  Participation Level:  Did Not Attend  Modes of Intervention:  Discussion, Education, Socialization and Support  Summary of Progress/Problems:Pt will identify unhealthy thoughts and how they impact their emotions and behavior. Pt will be encouraged to discuss these thoughts, emotions and behaviors with the group.   [place] MSW, [place]  01/10/2015, 3:09 PM

## 2015-01-11 MED ORDER — MOMETASONE FURO-FORMOTEROL FUM 100-5 MCG/ACT IN AERO
2.0000 | INHALATION_SPRAY | Freq: Two times a day (BID) | RESPIRATORY_TRACT | Status: DC
  Administered 2015-01-11 – 2015-01-13 (×5): 2 via RESPIRATORY_TRACT
  Filled 2015-01-11: qty 8.8

## 2015-01-11 MED ORDER — ALBUTEROL SULFATE HFA 108 (90 BASE) MCG/ACT IN AERS
2.0000 | INHALATION_SPRAY | Freq: Four times a day (QID) | RESPIRATORY_TRACT | Status: DC | PRN
  Filled 2015-01-11: qty 6.7

## 2015-01-11 NOTE — Progress Notes (Signed)
Jennifer Lawson had a good shift, she spent most of the evening in the dayroom with peers, she took medications and was med compliant, she voiced that she is ready for discharge, she had no behavioral issues to report on shift.

## 2015-01-11 NOTE — Progress Notes (Signed)
Pt continues to need frequent redirecting. Pt has been focused on being discharged, constantly stating "when am I going home". Pt's daughter called today and requested to speak with her which was permitted . The conversation was very brief . Pt has been seclusive to her room most of the day. Pt denies SI and A/V hallucinations.

## 2015-01-11 NOTE — Progress Notes (Signed)
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 She refused dialysis for 10 days prior to coming to the hospital. In the hospital she only received partial treatment due to agitation. I spoke to Dr. Zollie Scale today. We will premedicate the patient with 20 mg of IM Geodon. This will be followed by 2 or 3 mg of Ativan once she is hooked up to the machine.   Jennifer Lawson is still rather paranoid, argumentative, and continues to demand to be discharged. This morning medications were brought into her room as patient was refusing to take them. After much encouragement from nursing and myself patient agree with taking them. Patient was uncooperative with the assessment she refused to answer questions and remained quiet.  She complained that we were not giving her all her medications and therefore she believes we are not real doctors.  Patient is slept 5 hours last night. The night before she only slept 1 hour.  Per nursing:Pt continues to need frequent redirecting. Pt has been focused on being discharged, constantly stating "when am I going home". Pt's daughter called today and requested to speak with her which was permitted . The conversation was very brief . Pt has been seclusive to her room most of the day. Pt denies SI and A/V hallucinations.  Per SW: 9/3 asking if the phones are tap.  Principal Problem: Bipolar affective disorder, mixed, severe, with psychotic behavior Diagnosis:   Patient Active Problem List   Diagnosis Date Noted  . Bipolar affective disorder, mixed, severe, with psychotic behavior [F31.64] 01/05/2015  . Pain in the chest [R07.9]   . Pleuritic chest pain [R07.81] 07/31/2014  . Symptomatic anemia [D64.9] 07/29/2014  . Absolute anemia [D64.9]   . CKD (chronic kidney disease)  stage V requiring chronic dialysis [N18.6, Z99.2] 05/13/2014  . Salicylate overdose [F75.883G] 05/13/2014  . ESRD (end stage renal disease) [N18.6] 01/16/2014  . MDD (major depressive disorder), recurrent, severe, with psychosis [F33.3] 09/29/2013  . COPD (chronic obstructive pulmonary disease) [J44.9] 09/26/2013  . COPD exacerbation [J44.1] 04/24/2013  . Hyperkalemia [E87.5] 04/23/2013  . Acute respiratory failure [J96.00] 04/23/2013  . Hypercalcemia [E83.52] 02/20/2013  . Hyperparathyroidism, primary [E21.0] 02/20/2013  . Tobacco use disorder [Z72.0] 11/13/2012  . Overdose of salicylate [P49.826E] 15/83/0940  . HTN (hypertension) [I10] 02/20/2012   Total Time spent with patient: 30 minutes   Past Medical History:  Past Medical History  Diagnosis Date  . Mental disorder   . Depression   . Hypertension   . Overdose   . Tobacco use disorder 11/13/2012  . Complication of anesthesia     difficulty going to sleep  . Chronic kidney disease     06/11/13- not on dialysis  . Shortness of breath     lying down flat  . PTSD (post-traumatic stress disorder)   . Asthma   . COPD (chronic obstructive pulmonary disease)   . Heart murmur   . GERD (gastroesophageal reflux disease)   . Seizures     "passsed out"  . History of blood transfusion   . Diabetes mellitus without complication     denies    Past Surgical History  Procedure Laterality Date  . Right knee replacement      she says it was last year.  . Esophagogastroduodenoscopy Left 11/14/2012  Procedure: ESOPHAGOGASTRODUODENOSCOPY (EGD);  Surgeon: [name], MD;  Location: [facility];  Service: Endoscopy;  Laterality: Left;  . Joint replacement Right     knee  . Parathyroidectomy    . Av fistula placement Right 06/12/2013    Procedure: ARTERIOVENOUS (AV) FISTULA CREATION; RIGHT  BASILIC VEIN TRANSPOSITION with Intraoperative ultrasound;  Surgeon: Mal [name], MD;  Location: [facility] OR;  Service: Vascular;  Laterality: Right;    Family History:  Family History  Problem Relation Age of Onset  . Diabetes Mother   . Hyperlipidemia Mother   . Hypertension Mother   . Diabetes Father   . Hypertension Father   . Hyperlipidemia Father    Social History:  History  Alcohol Use No    Comment: none in over a month per daughter     History  Drug Use No    Comment: pt denied any drug use    Social History   Social History  . Marital Status: Divorced    Spouse Name: N/A  . Number of Children: N/A  . Years of Education: N/A   Social History Main Topics  . Smoking status: Current Every Day Smoker -- 2.00 packs/day for 40 years    Types: Cigarettes, Cigars  . Smokeless tobacco: Never Used  . Alcohol Use: No     Comment: none in over a month per daughter  . Drug Use: No     Comment: pt denied any drug use  . Sexual Activity: No   Other Topics Concern  . None   Social History Narrative   Additional History:    Sleep: Good  Appetite:  Fair   Assessment:   Musculoskeletal: Strength & Muscle Tone: within normal limits Gait & Station: normal Patient leans: N/A   Psychiatric Specialty Exam: Physical Exam  Nursing note and vitals reviewed.   Review of Systems  Constitutional: Negative.   HENT: Negative.   Eyes: Negative.   Respiratory: Positive for shortness of breath.   Cardiovascular: Negative.   Gastrointestinal: Negative.   Genitourinary: Negative.   Musculoskeletal: Negative.   Skin: Negative.   Neurological: Negative.   Endo/Heme/Allergies: Negative.   Psychiatric/Behavioral: Negative.   All other systems reviewed and are negative.   Blood pressure 142/86, pulse 81, temperature 98.8 F (37.1 C), temperature source Oral, resp. rate 18, weight 87.7 kg (193 lb 5.5 oz), last menstrual period 05/09/2008, SpO2 98 %.Body mass index is 35.35 kg/(m^2).  General Appearance: Bizarre  Eye Contact::  Fair  Speech:  Clear and Coherent  Volume:  Normal  Mood:  Anxious  Affect:  Labile   Thought Process:  Irrelevant  Orientation:  Full (Time, Place, and Person)  Thought Content:  Delusions, Hallucinations: Auditory and Paranoid Ideation  Suicidal Thoughts:  No  Homicidal Thoughts:  No  Memory:  Immediate;   Fair Recent;   Fair Remote;   Fair  Judgement:  Poor  Insight:  Lacking  Psychomotor Activity:  Increased  Concentration:  Fair  Recall:  [employer] of Knowledge:Fair  Language: Fair  Akathisia:  No  Handed:  Right  AIMS (if indicated):     Assets:  Communication Skills Desire for Improvement Financial Resources/Insurance Housing Social Support  ADL's:  Intact  Cognition: WNL  Sleep:  Number of Hours: 5.5     Current Medications: Current Facility-Administered Medications  Medication Dose Route Frequency Provider Last Rate Last Dose  . 0.9 %  sodium chloride infusion  100 mL Intravenous PRN Munsoor Lateef, MD      .  0.9 %  sodium chloride infusion  100 mL Intravenous PRN Munsoor Lateef, MD      . acetaminophen (TYLENOL) tablet 650 mg  650 mg Oral Q4H PRN Gonzella Lex, MD   650 mg at 01/11/15 0149  . alteplase (CATHFLO ACTIVASE) injection 2 mg  2 mg Intracatheter Once PRN Munsoor Lateef, MD      . cinacalcet (SENSIPAR) tablet 60 mg  60 mg Oral Q breakfast Gonzella Lex, MD   60 mg at 01/11/15 0857  . divalproex (DEPAKOTE) DR tablet 500 mg  500 mg Oral 3 times per day Clovis Fredrickson, MD   500 mg at 01/11/15 0858  . feeding supplement (NEPRO CARB STEADY) liquid 237 mL  237 mL Oral PRN Munsoor Lateef, MD      . heparin injection 1,000 Units  1,000 Units Dialysis PRN Munsoor Lateef, MD      . lidocaine (PF) (XYLOCAINE) 1 % injection 5 mL  5 mL Intradermal PRN Munsoor Lateef, MD      . lidocaine-prilocaine (EMLA) cream 1 application  1 application Topical PRN Munsoor Lateef, MD      . LORazepam (ATIVAN) tablet 1 mg  1 mg Oral TID PRN Hildred Priest, MD      . LORazepam (ATIVAN) tablet 2 mg  2 mg Oral QHS Hildred Priest, MD   2  mg at 01/11/15 0146  . metoprolol tartrate (LOPRESSOR) tablet 12.5 mg  12.5 mg Oral BID Gonzella Lex, MD   12.5 mg at 01/11/15 0859  . nicotine (NICODERM CQ - dosed in mg/24 hours) patch 21 mg  21 mg Transdermal Daily Gonzella Lex, MD   21 mg at 01/07/15 0936  . pentafluoroprop-tetrafluoroeth (GEBAUERS) aerosol 1 application  1 application Topical PRN Munsoor Lateef, MD      . traMADol (ULTRAM) tablet 100 mg  100 mg Oral Q12H PRN Jolanta B Pucilowska, MD      . ziprasidone (GEODON) capsule 80 mg  80 mg Oral BID WC Jolanta B Pucilowska, MD   80 mg at 01/11/15 0857  . ziprasidone (GEODON) injection 20 mg  20 mg Intramuscular Daily PRN Clovis Fredrickson, MD        Lab Results:  Results for orders placed or performed during the hospital encounter of 01/05/15 (from the past 48 hour(s))  Renal function panel     Status: Abnormal   Collection Time: 01/09/15  2:48 PM  Result Value Ref Range   Sodium 138 135 - 145 mmol/L   Potassium 5.0 3.5 - 5.1 mmol/L   Chloride 110 101 - 111 mmol/L   CO2 24 22 - 32 mmol/L   Glucose, Bld 139 (H) 65 - 99 mg/dL   BUN 38 (H) 6 - 20 mg/dL   Creatinine, Ser 3.86 (H) 0.44 - 1.00 mg/dL   Calcium 9.3 8.9 - 10.3 mg/dL   Phosphorus 4.1 2.5 - 4.6 mg/dL   Albumin 3.1 (L) 3.5 - 5.0 g/dL   GFR calc non Af Amer 12 (L) >60 mL/min   GFR calc Af Amer 14 (L) >60 mL/min    Comment: (NOTE) The eGFR has been calculated using the CKD EPI equation. This calculation has not been validated in all clinical situations. eGFR's persistently <60 mL/min signify possible Chronic Kidney Disease.    Anion gap 4 (L) 5 - 15    Physical Findings: AIMS: Facial and Oral Movements Muscles of Facial Expression: Minimal Lips and Perioral Area: Minimal Jaw: None, normal Tongue: None, normal,Extremity Movements  Upper (arms, wrists, hands, fingers): None, normal Lower (legs, knees, ankles, toes): None, normal, Trunk Movements Neck, shoulders, hips: None, normal, Overall  Severity Severity of abnormal movements (highest score from questions above): None, normal Incapacitation due to abnormal movements: None, normal Patient's awareness of abnormal movements (rate only patient's report): No Awareness, Dental Status Current problems with teeth and/or dentures?: Yes Does patient usually wear dentures?: Yes  CIWA:  CIWA-Ar Total: 0 COWS:  COWS Total Score: 0  Treatment Plan Summary: Daily contact with patient to assess and evaluate symptoms and progress in treatment and Medication management   Medical Decision Making:  Established Problem, Stable/Improving (1), Review of Psycho-Social Stressors (1), Review or order clinical lab tests (1), Review of Medication Regimen & Side Effects (2) and Review of New Medication or Change in Dosage (2)   Jennifer Montini is a 54 year old female with a history of bipolar disorder, on dialysis, admitted for manic, psychotic episode in the context of medication noncompliance.  Mood and psychosis. The patient was restarted on Geodon 80 mg bid and Depakote .  Currently on depakote 500 mg tid. Will check depakote level on Tuesday am.  Insomnia: will start ativan 2 mg po qhs  Agitation/anxiety: will change ativan to 1 mg tid prn (prior order q4 h prn)   ESRD. The patient should be on dialysis Tuesday, Thursday, Saturday. Her last dialysis was on the 20th. She is not compliant with treatment here. We offer im Geodon prior to dialysis. Next dialysis in the hospital has been is scheduled for Monday as she had her last dialysis on Friday.  Hypertension. She is on metoprolol.  COPD: Patient complaining of shortness of breath today. Per history she has diagnosis of COPD and in the past has been prescribed with albuterol when necessary and Advair 1 puff twice a day. Hospital does not have advair.  Ruthe Mannan will  be used instead  Smoking. Nicotine patch is available.  Pain. continue Tramadol.   Disposition. She will return to home with her  daughter. Follow-up with her regular psychiatrist.    Hildred Priest 01/11/2015, 11:23 AM

## 2015-01-11 NOTE — Plan of Care (Signed)
Problem: Ineffective individual coping Goal: LTG: Patient will report a decrease in negative feelings Outcome: Progressing States she feels ready to go home, denies current SI, HI, and AVH. Willing to be compliant with medications and dialysis treatment.  Goal: STG: Patient will remain free from self harm Outcome: Progressing No self-harm.

## 2015-01-11 NOTE — Plan of Care (Signed)
Problem: Ineffective individual coping Goal: STG: Patient will remain free from self harm Outcome: Progressing Jennifer Lawson denies thoughts of wanting to harm herself and she verbalizes to come to staff if she has thoughts of wanting to harm herself.

## 2015-01-11 NOTE — BHH Group Notes (Signed)
[place] Group Notes:  (Nursing/MHT/Case Management/Adjunct)  Date:  01/11/2015  Time:  8:52 AM  Type of Therapy:  Group Therapy  Participation Level:  Did Not Attend  Summary of Progress/Problems:  Jennifer Lawson 01/11/2015, 8:52 AM

## 2015-01-11 NOTE — BHH Group Notes (Signed)
[place] LCSW Group Therapy  01/11/2015 3:14 PM  Type of Therapy:  Group Therapy  Participation Level:  Did Not Attend  Modes of Intervention:  Discussion, Education, Socialization and Support  Summary of Progress/Problems: Todays topic: Grudges  Patients will be encouraged to discuss their thoughts, feelings, and behaviors as to why one holds on to grudges and reasons why people have grudges. Patients will process the impact of grudges on their daily lives and identify thoughts and feelings related to holding grudges. Patients will identify feelings and thoughts related to what life would look like without grudges.   [place] MSW, LCSWA  01/11/2015, 3:14 PM

## 2015-01-12 LAB — RENAL FUNCTION PANEL
Albumin: 3.1 g/dL — ABNORMAL LOW (ref 3.5–5.0)
Anion gap: 5 (ref 5–15)
BUN: 42 mg/dL — ABNORMAL HIGH (ref 6–20)
CO2: 26 mmol/L (ref 22–32)
Calcium: 9.1 mg/dL (ref 8.9–10.3)
Chloride: 106 mmol/L (ref 101–111)
Creatinine, Ser: 4.29 mg/dL — ABNORMAL HIGH (ref 0.44–1.00)
GFR calc Af Amer: 12 mL/min — ABNORMAL LOW (ref >60)
GFR calc non Af Amer: 11 mL/min — ABNORMAL LOW (ref >60)
Glucose, Bld: 69 mg/dL (ref 65–99)
Phosphorus: 5 mg/dL — ABNORMAL HIGH (ref 2.5–4.6)
Potassium: 5.8 mmol/L — ABNORMAL HIGH (ref 3.5–5.1)
Sodium: 137 mmol/L (ref 135–145)

## 2015-01-12 LAB — CBC
HCT: 30.8 % — ABNORMAL LOW (ref 35.0–47.0)
Hemoglobin: 9.8 g/dL — ABNORMAL LOW (ref 12.0–16.0)
MCH: 31.6 pg (ref 26.0–34.0)
MCHC: 31.7 g/dL — ABNORMAL LOW (ref 32.0–36.0)
MCV: 99.9 fL (ref 80.0–100.0)
Platelets: 163 10*3/uL (ref 150–440)
RBC: 3.08 MIL/uL — ABNORMAL LOW (ref 3.80–5.20)
RDW: 19.1 % — ABNORMAL HIGH (ref 11.5–14.5)
WBC: 3.4 10*3/uL — ABNORMAL LOW (ref 3.6–11.0)

## 2015-01-12 MED ORDER — LORAZEPAM 2 MG/ML IJ SOLN
2.0000 mg | Freq: Once | INTRAMUSCULAR | Status: AC
  Administered 2015-01-12: 2 mg via INTRAVENOUS
  Filled 2015-01-12: qty 1

## 2015-01-12 MED ORDER — NEPRO/CARBSTEADY PO LIQD
237.0000 mL | Freq: Every morning | ORAL | Status: DC
  Administered 2015-01-12: 237 mL via ORAL

## 2015-01-12 NOTE — Progress Notes (Signed)
PRE HD   01/12/15 0915  Neurological  Level of Consciousness Alert  Orientation Level Oriented to person;Oriented to place;Oriented to time  Respiratory  Respiratory Pattern Regular;Unlabored  Bilateral Breath Sounds Clear  Cough None  Cardiac  Pulse Regular  ECG Monitor Yes  Cardiac Rhythm NSR  Ectopy Ventricular tachycardia, non-sustained  Ectopy Frequency Rare  Vascular  Edema Generalized;Right lower extremity;Left lower extremity  Generalized Edema +1  RLE Edema +3  LLE Edema +3  Integumentary  Integumentary (WDL) WDL  Musculoskeletal  Musculoskeletal (WDL) WDL  Gastrointestinal  Bowel Sounds Assessment Active  GU Assessment  Genitourinary (WDL) WDL  Psychosocial  Psychosocial (WDL) X  Patient Behaviors Agitated;Irritable;Cooperative;Withdrawn  Needs Expressed Denies  Emotional support given Given to patient

## 2015-01-12 NOTE — Progress Notes (Signed)
PRE HD   01/12/15 0930  Report  Report Received From ERIC, RN  Vital Signs  Temp 98.6 F (37 C)  Temp Source Oral  Pulse Rate 78  Pulse Rate Source Monitor  Resp 19  BP 127/88 mmHg  BP Location Left Arm  BP Method Automatic  Patient Position (if appropriate) Sitting  Oxygen Therapy  SpO2 98 %  O2 Device Room Air  Pain Assessment  Pain Assessment No/denies pain  Pain Score 0  Dialysis Weight  Weight 91.1 kg (200 lb 13.4 oz)  Type of Weight Pre-Dialysis  Time-Out for Hemodialysis  What Procedure? HEMODIALYSIS  Pt Identifiers(min of two) First/Last Name;MRN/Account#  Correct Site? Yes  Correct Side? Yes  Correct Procedure? Yes  Consents Verified? Yes  Rad Studies Available? N/A  Safety Precautions Reviewed? Yes  Engineer, civil (consulting) Number [phone]  Station Number 4  UF/Alarm Test Passed  Conductivity: Meter 13.8  Conductivity: Machine  14  pH 7.4  Reverse Osmosis MAIN  Dialyzer Lot Number [id number]  Disposable Set Lot Number [id number]  Machine Temperature 98.6 F (37 C)  Musician and Audible Yes  Blood Lines Intact and Secured Yes  Pre Treatment Patient Checks  Vascular access used during treatment Fistula  Hepatitis B Surface Antigen Results Negative  Date Hepatitis B Surface Antigen Drawn 01/06/15  Hepatitis B Surface Antibody (IMMUNE)  Date Hepatitis B Surface Antibody Drawn 01/06/15  Hemodialysis Consent Verified Yes  Hemodialysis Standing Orders Initiated Yes  ECG (Telemetry) Monitor On Yes  Prime Ordered Normal Saline  Length of  DialysisTreatment -hour(s) 3 Hour(s)  Dialysis Treatment Comments GOAL TO REMOVE 2L IN 3 HOURS.  PT ALERT AND VS STABLE.  PT SOMEWHAT AGITATED AND ASKING FOR FOOD.  15G NEEDLES INSERTED WITHOUT DIFFICULTY.    Dialyzer Optiflux 180 NR  Dialysate 2K, 2.5 Ca  Dialysis Anticoagulant None  Dialysate Flow Ordered 600  Blood Flow Rate Ordered 400 mL/min  Ultrafiltration Goal 2 Liters  Pre Treatment Labs Renal panel;CBC   Dialysis Blood Pressure Support Ordered Normal Saline  Fistula / Graft Right Upper arm Arteriovenous fistula  No Placement Date or Time found.   Placed prior to admission: Yes  Orientation: Right  Access Location: Upper arm  Access Type: Arteriovenous fistula  Site Condition No complications  Fistula / Graft Assessment Present;Thrill;Bruit  Status Accessed  Needle Size 15  Drainage Description None

## 2015-01-12 NOTE — Progress Notes (Signed)
Patient frequently visiting the nurses station asking when she will be discharged. Currently in room, alert and oriented. Denies suicidal/homicidal thoughts. Staff continue to monitor for safety and other needs.

## 2015-01-12 NOTE — Progress Notes (Signed)
Patient awake and alert this morning.  She went to dialysis and is currently off the unit.  [facility] staff (MHT) at chairside with pt currently.

## 2015-01-12 NOTE — Progress Notes (Signed)
POST HD   01/12/15 1300  Neurological  Level of Consciousness Alert  Orientation Level Oriented to person;Oriented to place  Respiratory  Respiratory Pattern Regular;Unlabored  Chest Assessment Chest expansion symmetrical  Bilateral Breath Sounds Clear  Cough None  Cardiac  Pulse Regular  ECG Monitor Yes  Cardiac Rhythm NSR  Vascular  Edema Generalized;Right lower extremity;Left lower extremity  RLE Edema +2  LLE Edema +2  Integumentary  Integumentary (WDL) WDL  Musculoskeletal  Musculoskeletal (WDL) WDL  Gastrointestinal  Bowel Sounds Assessment Active  GU Assessment  Genitourinary (WDL) WDL  Psychosocial  Psychosocial (WDL) WDL  Emotional support given Given to patient

## 2015-01-12 NOTE — BHH Group Notes (Signed)
[facility] LCSW Aftercare Discharge Planning Group Note   01/12/2015 11:59 AM  Participation Quality:   Patient did not attend group.   Keene Breath, MSW, LCSWA

## 2015-01-12 NOTE — Progress Notes (Signed)
Initial Nutrition Assessment    INTERVENTION:   Meals and Snacks: Cater to patient preferences Medical Food Supplement Therapy: order for Nepro prn; recommend changing frequency to daily   NUTRITION DIAGNOSIS:   Inadequate oral intake related to acute illness as evidenced by meal completion < 25%. Improving   GOAL:   Patient will meet greater than or equal to 90% of their needs   MONITOR:    (Energy Intake, Electrolyte and renal Profile, Anthropometrics)   ASSESSMENT:   Pt admitted with depression, refusing HD, medications.  Receiving dialysis treatments in hospital, continues to need frequent redirecting    Diet Order:  Diet renal with fluid restriction Fluid restriction:: 1200 mL Fluid; Room service appropriate?: Yes; Fluid consistency:: Thin   Energy Intake: recorded po intake 51% of meals, recorded po intake 0-100%. Some missed meals (recorded 0%) are likely due to pt missing meal time while at dialysis.   Electrolyte and Renal Profile:  Recent Labs Lab 01/08/15 1635 01/09/15 1448 01/12/15 0916  BUN  --  38* 42*  CREATININE  --  3.86* 4.29*  NA  --  138 137  K  --  5.0 5.8*  PHOS 3.6 4.1 5.0*   Meds: sensipar  Height:   Ht Readings from Last 1 Encounters:  07/29/14 5\' 2"  (1.575 m)    Weight:   Wt Readings from Last 1 Encounters:  01/12/15 200 lb 13.4 oz (91.1 kg)   Filed Weights   01/09/15 1410 01/09/15 1709 01/12/15 0930  Weight: 195 lb 8.8 oz (88.7 kg) 193 lb 5.5 oz (87.7 kg) 200 lb 13.4 oz (91.1 kg)     BMI:  Body mass index is 36.72 kg/(m^2).  EDUCATION NEEDS:   Education needs no appropriate at this time  [place] MS, RD, LDN [phone] Pager

## 2015-01-12 NOTE — Progress Notes (Signed)
[facility] MD Progress Note  01/12/2015 2:09 PM Jennifer [facility]  MRN:  [id number]  Subjective: Jennifer [facility] is a 54 year old female with a history of bipolar disorder on dialysis who was admitted for psychotic symptoms in the context of treatment noncompliance. She refused dialysis for 10 days prior to coming to the [facility]. In the [facility] she only received partial treatment due to agitation. I spoke to Dr. Zollie Scale today. We will premedicate the patient with 20 mg of IM Geodon. This will be followed by 2 or 3 mg of Ativan once she is hooked up to the machine.   Yesterday morning the patient was refusing medications in the morning and is stating that the doctors here were not real doctors because she was not getting her medications.  Over night were no issues patient was cooperative with staff and medications. This morning she was cooperative with dialysis. After the addition of the Ativan patient seems to be sleeping better. Today patient denies SI, HI or auditory or visual hallucinations. She still irritable and is hoping to be discharged as soon as possible. Denies side effects from medications. Continues to complain of having pains all over (her description is bizarre and odd but she does not appear to be in any pain or distress).  Per nursing: Alexis had a good shift, she spent most of the evening in the dayroom with peers, she took medications and was med compliant, she voiced that she is ready for discharge, she had no behavioral issues to report on shift.  Had Dialysis today w/o problems.  Per SW: 9/3 asking if the phones are tap.  Principal Problem: Bipolar affective disorder, mixed, severe, with psychotic behavior Diagnosis:   Patient Active Problem List   Diagnosis Date Noted  . Bipolar affective disorder, mixed, severe, with psychotic behavior [F31.64] 01/05/2015  . Pain in the chest [R07.9]   . Pleuritic chest pain [R07.81] 07/31/2014  . Symptomatic anemia [D64.9] 07/29/2014  . Absolute  anemia [D64.9]   . CKD (chronic kidney disease) stage V requiring chronic dialysis [N18.6, Z99.2] 05/13/2014  . Salicylate overdose [Y10.175Z] 05/13/2014  . ESRD (end stage renal disease) [N18.6] 01/16/2014  . MDD (major depressive disorder), recurrent, severe, with psychosis [F33.3] 09/29/2013  . COPD (chronic obstructive pulmonary disease) [J44.9] 09/26/2013  . COPD exacerbation [J44.1] 04/24/2013  . Hyperkalemia [E87.5] 04/23/2013  . Acute respiratory failure [J96.00] 04/23/2013  . Hypercalcemia [E83.52] 02/20/2013  . Hyperparathyroidism, primary [E21.0] 02/20/2013  . Tobacco use disorder [Z72.0] 11/13/2012  . Overdose of salicylate [W25.852D] 78/24/2353  . HTN (hypertension) [I10] 02/20/2012   Total Time spent with patient: 30 minutes   Past Medical History:  Past Medical History  Diagnosis Date  . Mental disorder   . Depression   . Hypertension   . Overdose   . Tobacco use disorder 11/13/2012  . Complication of anesthesia     difficulty going to sleep  . Chronic kidney disease     06/11/13- not on dialysis  . Shortness of breath     lying down flat  . PTSD (post-traumatic stress disorder)   . Asthma   . COPD (chronic obstructive pulmonary disease)   . Heart murmur   . GERD (gastroesophageal reflux disease)   . Seizures     "passsed out"  . History of blood transfusion   . Diabetes mellitus without complication     denies    Past Surgical History  Procedure Laterality Date  . Right knee replacement      she  says it was last year.  . Esophagogastroduodenoscopy Left 11/14/2012    Procedure: ESOPHAGOGASTRODUODENOSCOPY (EGD);  Surgeon: [name], MD;  Location: [facility];  Service: Endoscopy;  Laterality: Left;  . Joint replacement Right     knee  . Parathyroidectomy    . Av fistula placement Right 06/12/2013    Procedure: ARTERIOVENOUS (AV) FISTULA CREATION; RIGHT  BASILIC VEIN TRANSPOSITION with Intraoperative ultrasound;  Surgeon: Mal [name], MD;  Location: [facility]  OR;  Service: Vascular;  Laterality: Right;   Family History:  Family History  Problem Relation Age of Onset  . Diabetes Mother   . Hyperlipidemia Mother   . Hypertension Mother   . Diabetes Father   . Hypertension Father   . Hyperlipidemia Father    Social History:  History  Alcohol Use No    Comment: none in over a month per daughter     History  Drug Use No    Comment: pt denied any drug use    Social History   Social History  . Marital Status: Divorced    Spouse Name: N/A  . Number of Children: N/A  . Years of Education: N/A   Social History Main Topics  . Smoking status: Current Every Day Smoker -- 2.00 packs/day for 40 years    Types: Cigarettes, Cigars  . Smokeless tobacco: Never Used  . Alcohol Use: No     Comment: none in over a month per daughter  . Drug Use: No     Comment: pt denied any drug use  . Sexual Activity: No   Other Topics Concern  . None   Social History Narrative   Additional History:    Sleep: Good  Appetite:  Fair   Assessment:   Musculoskeletal: Strength & Muscle Tone: within normal limits Gait & Station: normal Patient leans: N/A   Psychiatric Specialty Exam: Physical Exam  Nursing note and vitals reviewed.   Review of Systems  Constitutional: Negative.   HENT: Negative.   Eyes: Negative.   Respiratory: Negative for shortness of breath.   Cardiovascular: Negative.   Gastrointestinal: Negative.   Genitourinary: Negative.   Musculoskeletal: Negative.   Skin: Negative.   Neurological: Negative.   Endo/Heme/Allergies: Negative.   Psychiatric/Behavioral: Negative.   All other systems reviewed and are negative.   Blood pressure 114/73, pulse 75, temperature 98.4 F (36.9 C), temperature source Oral, resp. rate 16, weight 88.6 kg (195 lb 5.2 oz), last menstrual period 05/09/2008, SpO2 98 %.Body mass index is 35.72 kg/(m^2).  General Appearance: Bizarre  Eye Contact::  Fair  Speech:  Clear and Coherent  Volume:   Normal  Mood:  Anxious  Affect:  Labile  Thought Process:  Irrelevant  Orientation:  Full (Time, Place, and Person)  Thought Content:  Delusions, Hallucinations: Auditory and Paranoid Ideation  Suicidal Thoughts:  No  Homicidal Thoughts:  No  Memory:  Immediate;   Fair Recent;   Fair Remote;   Fair  Judgement:  Poor  Insight:  Lacking  Psychomotor Activity:  Increased  Concentration:  Fair  Recall:  [employer] of Knowledge:Fair  Language: Fair  Akathisia:  No  Handed:  Right  AIMS (if indicated):     Assets:  Communication Skills Desire for Improvement Financial Resources/Insurance Housing Social Support  ADL's:  Intact  Cognition: WNL  Sleep:  Number of Hours: 6.5     Current Medications: Current Facility-Administered Medications  Medication Dose Route Frequency Provider Last Rate Last Dose  . 0.9 %  sodium chloride infusion  100 mL Intravenous PRN Munsoor Lateef, MD      . 0.9 %  sodium chloride infusion  100 mL Intravenous PRN Munsoor Lateef, MD      . acetaminophen (TYLENOL) tablet 650 mg  650 mg Oral Q4H PRN Gonzella Lex, MD   650 mg at 01/11/15 0149  . albuterol (PROVENTIL HFA;VENTOLIN HFA) 108 (90 BASE) MCG/ACT inhaler 2 puff  2 puff Inhalation Q6H PRN Hildred Priest, MD      . alteplase (CATHFLO ACTIVASE) injection 2 mg  2 mg Intracatheter Once PRN Munsoor Lateef, MD      . cinacalcet (SENSIPAR) tablet 60 mg  60 mg Oral Q breakfast Gonzella Lex, MD   60 mg at 01/12/15 0847  . divalproex (DEPAKOTE) DR tablet 500 mg  500 mg Oral 3 times per day Clovis Fredrickson, MD   500 mg at 01/12/15 1351  . feeding supplement (NEPRO CARB STEADY) liquid 237 mL  237 mL Oral q morning - 10a Lavonia Dana, MD   237 mL at 01/12/15 1323  . heparin injection 1,000 Units  1,000 Units Dialysis PRN Munsoor Lateef, MD      . lidocaine (PF) (XYLOCAINE) 1 % injection 5 mL  5 mL Intradermal PRN Munsoor Lateef, MD      . lidocaine-prilocaine (EMLA) cream 1 application  1  application Topical PRN Munsoor Lateef, MD      . LORazepam (ATIVAN) tablet 1 mg  1 mg Oral TID PRN Hildred Priest, MD      . LORazepam (ATIVAN) tablet 2 mg  2 mg Oral QHS Hildred Priest, MD   2 mg at 01/11/15 2112  . metoprolol tartrate (LOPRESSOR) tablet 12.5 mg  12.5 mg Oral BID Gonzella Lex, MD   12.5 mg at 01/12/15 0847  . mometasone-formoterol (DULERA) 100-5 MCG/ACT inhaler 2 puff  2 puff Inhalation BID Hildred Priest, MD   2 puff at 01/12/15 0848  . nicotine (NICODERM CQ - dosed in mg/24 hours) patch 21 mg  21 mg Transdermal Daily Gonzella Lex, MD   21 mg at 01/07/15 0936  . pentafluoroprop-tetrafluoroeth (GEBAUERS) aerosol 1 application  1 application Topical PRN Munsoor Lateef, MD      . traMADol (ULTRAM) tablet 100 mg  100 mg Oral Q12H PRN Jolanta B Pucilowska, MD      . ziprasidone (GEODON) capsule 80 mg  80 mg Oral BID WC Jolanta B Pucilowska, MD   80 mg at 01/12/15 0847  . ziprasidone (GEODON) injection 20 mg  20 mg Intramuscular Daily PRN Clovis Fredrickson, MD   20 mg at 01/12/15 1009    Lab Results:  Results for orders placed or performed during the [facility] encounter of 01/05/15 (from the past 48 hour(s))  Renal function panel     Status: Abnormal   Collection Time: 01/12/15  9:16 AM  Result Value Ref Range   Sodium 137 135 - 145 mmol/L   Potassium 5.8 (H) 3.5 - 5.1 mmol/L   Chloride 106 101 - 111 mmol/L   CO2 26 22 - 32 mmol/L   Glucose, Bld 69 65 - 99 mg/dL   BUN 42 (H) 6 - 20 mg/dL   Creatinine, Ser 4.29 (H) 0.44 - 1.00 mg/dL   Calcium 9.1 8.9 - 10.3 mg/dL   Phosphorus 5.0 (H) 2.5 - 4.6 mg/dL   Albumin 3.1 (L) 3.5 - 5.0 g/dL   GFR calc non Af Amer 11 (L) >60 mL/min   GFR calc  Af Amer 12 (L) >60 mL/min    Comment: (NOTE) The eGFR has been calculated using the CKD EPI equation. This calculation has not been validated in all clinical situations. eGFR's persistently <60 mL/min signify possible Chronic Kidney Disease.    Anion  gap 5 5 - 15  CBC     Status: Abnormal   Collection Time: 01/12/15  9:16 AM  Result Value Ref Range   WBC 3.4 (L) 3.6 - 11.0 K/uL   RBC 3.08 (L) 3.80 - 5.20 MIL/uL   Hemoglobin 9.8 (L) 12.0 - 16.0 g/dL   HCT 30.8 (L) 35.0 - 47.0 %   MCV 99.9 80.0 - 100.0 fL   MCH 31.6 26.0 - 34.0 pg   MCHC 31.7 (L) 32.0 - 36.0 g/dL   RDW 19.1 (H) 11.5 - 14.5 %   Platelets 163 150 - 440 K/uL    Physical Findings: AIMS: Facial and Oral Movements Muscles of Facial Expression: Minimal Lips and Perioral Area: Minimal Jaw: None, normal Tongue: None, normal,Extremity Movements Upper (arms, wrists, hands, fingers): None, normal Lower (legs, knees, ankles, toes): None, normal, Trunk Movements Neck, shoulders, hips: None, normal, Overall Severity Severity of abnormal movements (highest score from questions above): None, normal Incapacitation due to abnormal movements: None, normal Patient's awareness of abnormal movements (rate only patient's report): No Awareness, Dental Status Current problems with teeth and/or dentures?: Yes Does patient usually wear dentures?: Yes  CIWA:  CIWA-Ar Total: 0 COWS:  COWS Total Score: 0  Treatment Plan Summary: Daily contact with patient to assess and evaluate symptoms and progress in treatment and Medication management   Medical Decision Making:  Established Problem, Stable/Improving (1), Review of Psycho-Social Stressors (1), Review or order clinical lab tests (1), Review of Medication Regimen & Side Effects (2) and Review of New Medication or Change in Dosage (2)   Miss Wolaver is a 54 year old female with a history of bipolar disorder, on dialysis, admitted for manic, psychotic episode in the context of medication noncompliance.  Mood and psychosis. The patient was restarted on Geodon 80 mg bid and Depakote .  Currently on depakote 500 mg tid. Will check depakote level on Tuesday am.  Insomnia: continue ativan 2 mg po qhs  Agitation/anxiety: will change ativan  to 1 mg tid prn (prior order q4 h prn)   ESRD. The patient should be on dialysis Tuesday, Thursday, Saturday. Her last dialysis was on the 20th. She is not compliant with treatment here. We offer im Geodon prior to dialysis. Next dialysis in the [facility] has been is scheduled for Monday as she had her last dialysis on Friday.  Hypertension. She is on metoprolol.  COPD: Patient complaining of shortness of breath on 9/4. Per history she has diagnosis of COPD and in the past has been prescribed with albuterol when necessary and Advair 1 puff twice a day. [facility] does not have advair.  Ruthe Mannan will  be used instead  Smoking. Nicotine patch is available.  Pain. continue Tramadol.   Disposition. She will return to home with her daughter. Follow-up with her regular psychiatrist.    Hildred Priest 01/12/2015, 2:09 PM

## 2015-01-12 NOTE — Progress Notes (Signed)
Pt returned from Dialysis without problem.  Bleeding controled.  Patient was escorted to her room and is currently resting in no distress.  Will cont to monitor.

## 2015-01-12 NOTE — Progress Notes (Signed)
POST HD   01/12/15 1245  Report  Report Received From ERIC ISBANOLY, RN  Vital Signs  Temp 98.4 F (36.9 C)  Temp Source Oral  Pulse Rate 75  Pulse Rate Source Monitor  Resp 16  BP 114/73 mmHg  BP Location Left Arm  BP Method Automatic  Patient Position (if appropriate) Sitting  Dialysis Weight  Weight 88.6 kg (195 lb 5.2 oz)  Type of Weight Post-Dialysis  During Hemodialysis Assessment  Intra-Hemodialysis Comments HD END. GOAL MET. BLOOD RETURNED AND  15 G NEEDLES REMOVED PER POLICY.  PT ALERT VS STABLE.    Post-Hemodialysis Assessment  Rinseback Volume (mL) 250 mL  Dialyzer Clearance Lightly streaked  Duration of HD Treatment -hour(s) 3 hour(s)  Hemodialysis Intake (mL) 500 mL  UF Total -Machine (mL) 2500 mL  Net UF (mL) 2000 mL  Tolerated HD Treatment Yes  Post-Hemodialysis Comments HD END. GOAL MET. BLOOD RETURNED AND  15 G NEEDLES REMOVED PER POLICY.  PT ALERT VS STABLE.    AVG/AVF Arterial Site Held (minutes) 7 minutes  AVG/AVF Venous Site Held (minutes) 7 minutes  Education / Care Plan  Hemodialysis Education Provided Yes  Documented Education in Clinical Pathway Yes  Fistula / Graft Right Upper arm Arteriovenous fistula  No Placement Date or Time found.   Placed prior to admission: Yes  Orientation: Right  Access Location: Upper arm  Access Type: Arteriovenous fistula  Site Condition No complications  Fistula / Graft Assessment Present;Thrill;Bruit  Status Deaccessed  Needle Size 15  Drainage Description None

## 2015-01-12 NOTE — Progress Notes (Signed)
HD START 

## 2015-01-12 NOTE — [facility] Group Notes (Signed)
[facility] LCSW Group Therapy  01/12/2015 2:52 PM  Type of Therapy:  Group Therapy  Participation Level:  Did Not Attend   Keene Breath, MSW, LCSWA 01/12/2015, 2:52 PM

## 2015-01-12 NOTE — Progress Notes (Signed)
[facility]  ROUNDING NOTE   Subjective:   Seen and examined on hemodialysis. Given ziprasidone and lorazepam before dialysis. With sitter. Resting comfortably.    Objective:  Vital signs in last 24 hours:  Temp:  [98.5 F (36.9 C)-98.6 F (37 C)] 98.6 F (37 C) (09/05 0930) Pulse Rate:  [76-84] 79 (09/05 1130) Resp:  [17-21] 17 (09/05 1130) BP: (101-132)/(66-88) 101/66 mmHg (09/05 1130) SpO2:  [98 %] 98 % (09/05 0930) Weight:  [91.1 kg (200 lb 13.4 oz)] 91.1 kg (200 lb 13.4 oz) (09/05 0930)  Weight change:  Filed Weights   01/09/15 1410 01/09/15 1709 01/12/15 0930  Weight: 88.7 kg (195 lb 8.8 oz) 87.7 kg (193 lb 5.5 oz) 91.1 kg (200 lb 13.4 oz)    Intake/Output: I/O last 3 completed shifts: In: 600 [P.O.:600] Out: -    Intake/Output this shift:  Total I/O In: 120 [P.O.:120] Out: -   Physical Exam: General: No acute distress  Head: Normocephalic, atraumatic. Moist oral mucosal membranes  Eyes: Anicteric  Neck: Supple, trachea midline  Lungs:  Clear to auscultation normal effort  Heart: Regular rate and rhythm  Abdomen:  Soft, nontender, BS present  Extremities:  trace peripheral edema.  Neurologic: Nonfocal, moving all four extremities  Skin: No lesions  Access: LUE AVF    Basic Metabolic Panel:  Recent Labs Lab 01/06/15 1900 01/08/15 1635 01/09/15 1448 01/12/15 0916  NA  --   --  138 137  K  --   --  5.0 5.8*  CL  --   --  110 106  CO2  --   --  24 26  GLUCOSE  --   --  139* 69  BUN  --   --  38* 42*  CREATININE  --   --  3.86* 4.29*  CALCIUM  --   --  9.3 9.1  PHOS 4.4 3.6 4.1 5.0*    Liver Function Tests:  Recent Labs Lab 01/09/15 1448 01/12/15 0916  ALBUMIN 3.1* 3.1*   No results for input(s): LIPASE, AMYLASE in the last 168 hours. No results for input(s): AMMONIA in the last 168 hours.  CBC:  Recent Labs Lab 01/12/15 0916  WBC 3.4*  HGB 9.8*  HCT 30.8*  MCV 99.9  PLT 163    Cardiac Enzymes: No results for  input(s): CKTOTAL, CKMB, CKMBINDEX, TROPONINI in the last 168 hours.  BNP: Invalid input(s): POCBNP  CBG: No results for input(s): GLUCAP in the last 168 hours.  Microbiology: Results for orders placed or performed during the hospital encounter of 07/29/14  MRSA PCR Screening     Status: None   Collection Time: 07/30/14  5:40 AM  Result Value Ref Range Status   MRSA by PCR NEGATIVE NEGATIVE Final    Comment:        The GeneXpert MRSA Assay (FDA approved for NASAL specimens only), is one component of a comprehensive MRSA colonization surveillance program. It is not intended to diagnose MRSA infection nor to guide or monitor treatment for MRSA infections.     Coagulation Studies: No results for input(s): LABPROT, INR in the last 72 hours.  Urinalysis: No results for input(s): COLORURINE, LABSPEC, PHURINE, GLUCOSEU, HGBUR, BILIRUBINUR, KETONESUR, PROTEINUR, UROBILINOGEN, NITRITE, LEUKOCYTESUR in the last 72 hours.  Invalid input(s): APPERANCEUR    Imaging: No results found.   Medications:     . cinacalcet  60 mg Oral Q breakfast  . divalproex  500 mg Oral 3 times per day  .  LORazepam  2 mg Oral QHS  . metoprolol tartrate  12.5 mg Oral BID  . mometasone-formoterol  2 puff Inhalation BID  . nicotine  21 mg Transdermal Daily  . ziprasidone  80 mg Oral BID WC   sodium chloride, sodium chloride, acetaminophen, albuterol, alteplase, feeding supplement (NEPRO CARB STEADY), heparin, lidocaine (PF), lidocaine-prilocaine, LORazepam, pentafluoroprop-tetrafluoroeth, traMADol, ziprasidone  Assessment/ Plan:  54 y.o. female with a PMHx of ESRD on HD since 1/16 followed by [facility], COPD, secondary hyperparathyroidism, anemia of CKD, bipolar affective disorder, who was admitted to [facility] on 01/05/2015 for evaluation of depression In the setting of known bipolar disorder. She apparently has not had dialysis for the past 10 days.   1. ESRD on HD TTHS: Pt missed dialysis  over a ten day period prior to admission. Started HD in 1/16, followed by [facility]: Due to underlying agitation and psychiatric condition it has been difficult to dialyze pt. - now on dialysis after benzodiazepines and anti-psychotic.   She is tolerated this well and is dialyzing comfortably. Next HD tomorrow to resume TTS schedule.   2. Anemia of CKD: hemoglobin 9.8 - epo with TTS treatment.   3. Secondary hyperparathyroidism of renal origin: phos 5 and acceptable, continue sensipar 60mg  po daily. Not on bindersl   4. Hypertension:  On metoprolol. Well controlled.    LOS: [place], [place] 9/5/201611:52 AM

## 2015-01-12 NOTE — Plan of Care (Signed)
Problem: Ineffective individual coping Goal: LTG: Patient will report a decrease in negative feelings Outcome: Progressing Patient currently denying suicidal and homicidal thoughts. Pleasant on approach. Taking medications voluntarily.

## 2015-01-12 NOTE — Plan of Care (Signed)
Problem: Altered Thought Processes AEB Goal: Anxiety is at manageable level Outcome: Progressing Patient is less anxious, calm in room and interacting appropriately with staff.

## 2015-01-12 NOTE — Progress Notes (Signed)
Recreation Therapy Notes  Date: 09.05.16 Time: 3:00 pm Location: Craft Room  Group Topic: Wellness  Goal Area(s) Addresses:  Patient will identify at least one item per dimension of health. Patient will examine areas they are deficient in.  Behavioral Response: Did not attend  Intervention: 6 Dimensions of Health  Activity: Patients were given a worksheet with the definitions of the 6 dimensions of health. Patients were given a worksheet with the 6 dimensions on it and instructed to list 2-3 things per each item.   Education: LRT educated patients on how the activity was related to their admission and d/c.   Education Outcome: Patient did not attend group.  Clinical Observations/Feedback: Patient did not attend group.  Leonette Monarch, LRT/CTRS 01/12/2015 4:13 PM

## 2015-01-12 NOTE — Progress Notes (Signed)
HD END 

## 2015-01-13 LAB — COMPREHENSIVE METABOLIC PANEL
ALT: 15 U/L (ref 14–54)
AST: 19 U/L (ref 15–41)
Albumin: 3 g/dL — ABNORMAL LOW (ref 3.5–5.0)
Alkaline Phosphatase: 108 U/L (ref 38–126)
Anion gap: 5 (ref 5–15)
BUN: 33 mg/dL — ABNORMAL HIGH (ref 6–20)
CO2: 29 mmol/L (ref 22–32)
Calcium: 9.8 mg/dL (ref 8.9–10.3)
Chloride: 106 mmol/L (ref 101–111)
Creatinine, Ser: 3.43 mg/dL — ABNORMAL HIGH (ref 0.44–1.00)
GFR calc Af Amer: 16 mL/min — ABNORMAL LOW (ref >60)
GFR calc non Af Amer: 14 mL/min — ABNORMAL LOW (ref >60)
Glucose, Bld: 85 mg/dL (ref 65–99)
Potassium: 5.2 mmol/L — ABNORMAL HIGH (ref 3.5–5.1)
Sodium: 140 mmol/L (ref 135–145)
Total Bilirubin: 0.2 mg/dL — ABNORMAL LOW (ref 0.3–1.2)
Total Protein: 6.4 g/dL — ABNORMAL LOW (ref 6.5–8.1)

## 2015-01-13 LAB — CBC
HCT: 32.4 % — ABNORMAL LOW (ref 35.0–47.0)
Hemoglobin: 10.2 g/dL — ABNORMAL LOW (ref 12.0–16.0)
MCH: 31.3 pg (ref 26.0–34.0)
MCHC: 31.5 g/dL — ABNORMAL LOW (ref 32.0–36.0)
MCV: 99.2 fL (ref 80.0–100.0)
Platelets: 167 10*3/uL (ref 150–440)
RBC: 3.26 MIL/uL — ABNORMAL LOW (ref 3.80–5.20)
RDW: 19.4 % — ABNORMAL HIGH (ref 11.5–14.5)
WBC: 3.5 10*3/uL — ABNORMAL LOW (ref 3.6–11.0)

## 2015-01-13 LAB — VALPROIC ACID LEVEL: Valproic Acid Lvl: 40 ug/mL — ABNORMAL LOW (ref 50.0–100.0)

## 2015-01-13 MED ORDER — MOMETASONE FURO-FORMOTEROL FUM 100-5 MCG/ACT IN AERO: 2.0000 | INHALATION_SPRAY | Freq: Two times a day (BID) | RESPIRATORY_TRACT | Status: DC

## 2015-01-13 MED ORDER — ALBUTEROL SULFATE HFA 108 (90 BASE) MCG/ACT IN AERS: 2.0000 | INHALATION_SPRAY | Freq: Four times a day (QID) | RESPIRATORY_TRACT | Status: DC | PRN

## 2015-01-13 MED ORDER — METOPROLOL TARTRATE 25 MG PO TABS: 12.5000 mg | ORAL_TABLET | Freq: Two times a day (BID) | ORAL | Status: DC

## 2015-01-13 MED ORDER — DIVALPROEX SODIUM ER 500 MG PO TB24: 500.0000 mg | ORAL_TABLET | Freq: Two times a day (BID) | ORAL | Status: DC

## 2015-01-13 MED ORDER — EPOETIN ALFA 4000 UNIT/ML IJ SOLN
4000.0000 [IU] | Freq: Once | INTRAMUSCULAR | Status: AC
  Administered 2015-01-13: 4000 [IU] via INTRAVENOUS
  Filled 2015-01-13: qty 1

## 2015-01-13 MED ORDER — ZIPRASIDONE HCL 60 MG PO CAPS: 60.0000 mg | ORAL_CAPSULE | Freq: Two times a day (BID) | ORAL | Status: DC

## 2015-01-13 NOTE — Progress Notes (Signed)
Patient was frequently seen  Around, requesting snacks or requesting to be discharged. Pt was restless for the majority of the shift, frequently requesting to be discharged. MD was notified and medications given as ordered. Safety precautions reinforced and emotional support offered.

## 2015-01-13 NOTE — Progress Notes (Signed)
HD tx start 

## 2015-01-13 NOTE — Progress Notes (Signed)
Pre-hd tx 

## 2015-01-13 NOTE — Progress Notes (Signed)
[facility]  ROUNDING NOTE   Subjective:   Seen and examined on hemodialysis. With sitter. Resting comfortably.  Back on TTS schedule.   Objective:  Vital signs in last 24 hours:  Temp:  [98.7 F (37.1 C)] 98.7 F (37.1 C) (09/06 0702) Pulse Rate:  [88-98] 88 (09/06 0702) Resp:  [20] 20 (09/06 0702) BP: (145-169)/(93-101) 148/101 mmHg (09/06 0702)  Weight change:  Filed Weights   01/09/15 1709 01/12/15 0930 01/12/15 1245  Weight: 87.7 kg (193 lb 5.5 oz) 91.1 kg (200 lb 13.4 oz) 88.6 kg (195 lb 5.2 oz)    Intake/Output: I/O last 3 completed shifts: In: 600 [P.O.:600] Out: 2000 [Other:2000]   Intake/Output this shift:  Total I/O In: 480 [P.O.:480] Out: -   Physical Exam: General: No acute distress  Head: Normocephalic, atraumatic. Moist oral mucosal membranes  Eyes: Anicteric  Neck: Supple, trachea midline  Lungs:  Clear to auscultation normal effort  Heart: Regular rate and rhythm  Abdomen:  Soft, nontender, BS present  Extremities:  trace peripheral edema.  Neurologic: Nonfocal, moving all four extremities  Skin: No lesions  Access: LUE AVF    Basic Metabolic Panel:  Recent Labs Lab 01/06/15 1900 01/08/15 1635 01/09/15 1448 01/12/15 0916 01/13/15 0824  NA  --   --  138 137 140  K  --   --  5.0 5.8* 5.2*  CL  --   --  110 106 106  CO2  --   --  24 26 29   GLUCOSE  --   --  139* 69 85  BUN  --   --  38* 42* 33*  CREATININE  --   --  3.86* 4.29* 3.43*  CALCIUM  --   --  9.3 9.1 9.8  PHOS 4.4 3.6 4.1 5.0*  --     Liver Function Tests:  Recent Labs Lab 01/09/15 1448 01/12/15 0916 01/13/15 0824  AST  --   --  19  ALT  --   --  15  ALKPHOS  --   --  108  BILITOT  --   --  0.2*  PROT  --   --  6.4*  ALBUMIN 3.1* 3.1* 3.0*   No results for input(s): LIPASE, AMYLASE in the last 168 hours. No results for input(s): AMMONIA in the last 168 hours.  CBC:  Recent Labs Lab 01/12/15 0916 01/13/15 0921  WBC 3.4* 3.5*  HGB 9.8* 10.2*   HCT 30.8* 32.4*  MCV 99.9 99.2  PLT 163 167    Cardiac Enzymes: No results for input(s): CKTOTAL, CKMB, CKMBINDEX, TROPONINI in the last 168 hours.  BNP: Invalid input(s): POCBNP  CBG: No results for input(s): GLUCAP in the last 168 hours.  Microbiology: Results for orders placed or performed during the hospital encounter of 07/29/14  MRSA PCR Screening     Status: None   Collection Time: 07/30/14  5:40 AM  Result Value Ref Range Status   MRSA by PCR NEGATIVE NEGATIVE Final    Comment:        The GeneXpert MRSA Assay (FDA approved for NASAL specimens only), is one component of a comprehensive MRSA colonization surveillance program. It is not intended to diagnose MRSA infection nor to guide or monitor treatment for MRSA infections.     Coagulation Studies: No results for input(s): LABPROT, INR in the last 72 hours.  Urinalysis: No results for input(s): COLORURINE, LABSPEC, PHURINE, GLUCOSEU, HGBUR, BILIRUBINUR, KETONESUR, PROTEINUR, UROBILINOGEN, NITRITE, LEUKOCYTESUR in the last 72 hours.  Invalid input(s): APPERANCEUR    Imaging: No results found.   Medications:     . cinacalcet  60 mg Oral Q breakfast  . divalproex  500 mg Oral 3 times per day  . epoetin (EPOGEN/PROCRIT) injection  4,000 Units Intravenous Once  . feeding supplement (NEPRO CARB STEADY)  237 mL Oral q morning - 10a  . LORazepam  2 mg Oral QHS  . metoprolol tartrate  12.5 mg Oral BID  . mometasone-formoterol  2 puff Inhalation BID  . nicotine  21 mg Transdermal Daily  . ziprasidone  80 mg Oral BID WC   sodium chloride, sodium chloride, acetaminophen, albuterol, alteplase, heparin, lidocaine (PF), lidocaine-prilocaine, LORazepam, pentafluoroprop-tetrafluoroeth, traMADol, ziprasidone  Assessment/ Plan:  54 y.o. female with a PMHx of ESRD on HD since 1/16 followed by [facility], COPD, secondary hyperparathyroidism, anemia of CKD, bipolar affective disorder, who was admitted to [facility] on  01/05/2015 for evaluation of depression In the setting of known bipolar disorder. She apparently has not had dialysis for the past 10 days.   1. ESRD on HD TTHS: Pt missed dialysis over a ten day period prior to admission. Started HD in 1/16, followed by [facility] Due to underlying agitation and psychiatric condition it has been difficult to dialyze  - now on dialysis after benzodiazepines and anti-psychotic.   She is tolerated this well and is dialyzing comfortably. Resume TTS schedule.   2. Anemia of CKD: hemoglobin 10.2 - epo with TTS treatment.   3. Secondary hyperparathyroidism of renal origin: phos 5 and acceptable, continue sensipar 60mg  po daily. Not on binders currently.   4. Hypertension:  On metoprolol. Well controlled.    LOS: [place], [place] 9/6/20162:20 PM

## 2015-01-13 NOTE — Progress Notes (Signed)
Recreation Therapy Notes  Date: 09.06.16 Time: 3:00 pm Location: Craft Room  Group Topic: Self-expression  Goal Area(s) Addresses:  Patient will identify one color per emotion listed on wheel. Patient will verbalize benefit of using art as a means of self-expression. Patient will verbalize one emotion experienced during session. Patient will be educated on other forms of self-expression.  Behavioral Response: Did not attend  Intervention: Emotion Wheel  Activity: Patients were given a worksheet with 7 different emotions and were instructed to pick a color for each emotion.   Education: LRT educated patients on different forms of self-expression.  Education Outcome: Patient did not attend group.  Clinical Observations/Feedback: Patient did not attend group.  Leonette [facility], LRT/CTRS 01/13/2015 5:04 PM

## 2015-01-13 NOTE — Discharge Summary (Signed)
Physician Discharge Summary Note  Patient:  Jennifer Lawson is an 54 y.o., female MRN:  [id number] DOB:  Mar 13, 1961 Patient phone:  [phone] (home)  Patient address:   [address] [place] [address],  Total Time spent with patient: 30 minutes  Date of Admission:  01/05/2015 Date of Discharge: 12/13/2014  Reason for Admission:  Refusing dialysis for ten days.  Identifying data. Jennifer Lawson is a 54 year old female with a history of bipolar disorder.  Chief complaint. "What's going on?"  History of present illness. [name] has a long history of bipolar. She was hospitalized several times at [facility] in 2015 for manic, psychotic episodes. Her daughter's report the patient stopped taking medications few weeks ago and became increasingly disorganized and difficult to work with. She refused to go to dialysis and has not received this necessary treatment since August 20. The family reports the patient is insomnia, irritable, agitated, intrusive, hard to redirect, paranoid, and hallucinating. The patient denies any symptoms of depression, anxiety, or psychosis. She denies symptoms of bipolar mania. She denies using alcohol or illicit substances. She is uncooperative on the interview. Her thinking is disorganized and she is not able to answer simple questions. She is oriented to person and place only. She does not believe that I am a physician. When in my office, she looks about as if she attends to internal stimuli.  Psychiatric history. Several admissions for manic episodes last time in 2015. There is history of suicide attempt by overdose.  Family psychiatric history. Unknown.  Social history. She is disabled from mental illness. She lives with her daughter. She has had insurance.  Principal Problem: Bipolar affective disorder, mixed, severe, with psychotic behavior Discharge Diagnoses: Patient Active Problem List   Diagnosis Date Noted  . Bipolar affective disorder,  mixed, severe, with psychotic behavior [F31.64] 01/05/2015  . Pain in the chest [R07.9]   . Pleuritic chest pain [R07.81] 07/31/2014  . Symptomatic anemia [D64.9] 07/29/2014  . Absolute anemia [D64.9]   . CKD (chronic kidney disease) stage V requiring chronic dialysis [N18.6, Z99.2] 05/13/2014  . Salicylate overdose [M19.622W] 05/13/2014  . ESRD (end stage renal disease) [N18.6] 01/16/2014  . MDD (major depressive disorder), recurrent, severe, with psychosis [F33.3] 09/29/2013  . COPD (chronic obstructive pulmonary disease) [J44.9] 09/26/2013  . COPD exacerbation [J44.1] 04/24/2013  . Hyperkalemia [E87.5] 04/23/2013  . Acute respiratory failure [J96.00] 04/23/2013  . Hypercalcemia [E83.52] 02/20/2013  . Hyperparathyroidism, primary [E21.0] 02/20/2013  . Tobacco use disorder [Z72.0] 11/13/2012  . Overdose of salicylate [L79.892J] 19/41/7408  . HTN (hypertension) [I10] 02/20/2012    Musculoskeletal: Strength & Muscle Tone: within normal limits Gait & Station: normal Patient leans: N/A  Psychiatric Specialty Exam: Physical Exam  Nursing note and vitals reviewed.   Review of Systems  All other systems reviewed and are negative.   Blood pressure 148/101, pulse 88, temperature 98.7 F (37.1 C), temperature source Oral, resp. rate 20, weight 88.6 kg (195 lb 5.2 oz), last menstrual period 05/09/2008, SpO2 98 %.Body mass index is 35.72 kg/(m^2).  See SRA.                                                  Sleep:  Number of Hours: 4      Has this patient used any form of tobacco in the last 30 days? (Cigarettes, Smokeless  Tobacco, Cigars, and/or Pipes) Yes, A prescription for an FDA-approved tobacco cessation medication was offered at discharge and the patient refused  Past Medical History:  Past Medical History  Diagnosis Date  . Mental disorder   . Depression   . Hypertension   . Overdose   . Tobacco use disorder 11/13/2012  . Complication of anesthesia      difficulty going to sleep  . Chronic kidney disease     06/11/13- not on dialysis  . Shortness of breath     lying down flat  . PTSD (post-traumatic stress disorder)   . Asthma   . COPD (chronic obstructive pulmonary disease)   . Heart murmur   . GERD (gastroesophageal reflux disease)   . Seizures     "passsed out"  . History of blood transfusion   . Diabetes mellitus without complication     denies    Past Surgical History  Procedure Laterality Date  . Right knee replacement      she says it was last year.  . Esophagogastroduodenoscopy Left 11/14/2012    Procedure: ESOPHAGOGASTRODUODENOSCOPY (EGD);  Surgeon: Jyothi Mann, MD;  Location: [facility];  Service: Endoscopy;  Laterality: Left;  . Joint replacement Right     knee  . Parathyroidectomy    . Av fistula placement Right 06/12/2013    Procedure: ARTERIOVENOUS (AV) FISTULA CREATION; RIGHT  BASILIC VEIN TRANSPOSITION with Intraoperative ultrasound;  Surgeon: James D Lawson, MD;  Location: [facility] OR;  Service: Vascular;  Laterality: Right;   Family History:  Family History  Problem Relation Age of Onset  . Diabetes Mother   . Hyperlipidemia Mother   . Hypertension Mother   . Diabetes Father   . Hypertension Father   . Hyperlipidemia Father    Social History:  History  Alcohol Use No    Comment: none in over a month per daughter     History  Drug Use No    Comment: pt denied any drug use    Social History   Social History  . Marital Status: Divorced    Spouse Name: N/A  . Number of Children: N/A  . Years of Education: N/A   Social History Main Topics  . Smoking status: Current Every Day Smoker -- 2.00 packs/day for 40 years    Types: Cigarettes, Cigars  . Smokeless tobacco: Never Used  . Alcohol Use: No     Comment: none in over a month per daughter  . Drug Use: No     Comment: pt denied any drug use  . Sexual Activity: No   Other Topics Concern  . None   Social History Narrative    Past Psychiatric  History: Hospitalizations:  Outpatient Care:  Substance Abuse Care:  Self-Mutilation:  Suicidal Attempts:  Violent Behaviors:   Risk to Self: Is patient at risk for suicide?: Yes Risk to Others:   Prior Inpatient Therapy:   Prior Outpatient Therapy:    Level of Care:  OP  Hospital Course:    Jennifer Merryfield is a 54-year-old female with a history of bipolar disorder, on dialysis, admitted for manic episode in the context of medication noncompliance.  1. Mood and psychosis. The patient will continue on Geodon 60 mg bid and Depakote 1500 mg/day. VPA 40.   2. ESRD. The patient completed dialysis on 01/12/2015 without problems. She will continue dialysis in the community.   3. Hypertension. She is on metoprolol.  4. Smoking. Nicotine patch was available.  5. Insomnia. She will continue   on Doxepin. .   6. COPD. We continued inhalers.   7. Disposition. She will return to home with her daughter. Follow-up with her regular psychiatrist at [facility].     Consults:  nephrology  Significant Diagnostic Studies:  None  Discharge Vitals:   Blood pressure 148/101, pulse 88, temperature 98.7 F (37.1 C), temperature source Oral, resp. rate 20, weight 88.6 kg (195 lb 5.2 oz), last menstrual period 05/09/2008, SpO2 98 %. Body mass index is 35.72 kg/(m^2). Lab Results:   Results for orders placed or performed during the hospital encounter of 01/05/15 (from the past 72 hour(s))  Renal function panel     Status: Abnormal   Collection Time: 01/12/15  9:16 AM  Result Value Ref Range   Sodium 137 135 - 145 mmol/L   Potassium 5.8 (H) 3.5 - 5.1 mmol/L   Chloride 106 101 - 111 mmol/L   CO2 26 22 - 32 mmol/L   Glucose, Bld 69 65 - 99 mg/dL   BUN 42 (H) 6 - 20 mg/dL   Creatinine, Ser 4.29 (H) 0.44 - 1.00 mg/dL   Calcium 9.1 8.9 - 10.3 mg/dL   Phosphorus 5.0 (H) 2.5 - 4.6 mg/dL   Albumin 3.1 (L) 3.5 - 5.0 g/dL   GFR calc non Af Amer 11 (L) >60 mL/min   GFR calc Af Amer 12 (L) >60 mL/min     Comment: (NOTE) The eGFR has been calculated using the CKD EPI equation. This calculation has not been validated in all clinical situations. eGFR's persistently <60 mL/min signify possible Chronic Kidney Disease.    Anion gap 5 5 - 15  CBC     Status: Abnormal   Collection Time: 01/12/15  9:16 AM  Result Value Ref Range   WBC 3.4 (L) 3.6 - 11.0 K/uL   RBC 3.08 (L) 3.80 - 5.20 MIL/uL   Hemoglobin 9.8 (L) 12.0 - 16.0 g/dL   HCT 30.8 (L) 35.0 - 47.0 %   MCV 99.9 80.0 - 100.0 fL   MCH 31.6 26.0 - 34.0 pg   MCHC 31.7 (L) 32.0 - 36.0 g/dL   RDW 19.1 (H) 11.5 - 14.5 %   Platelets 163 150 - 440 K/uL  Valproic acid level     Status: Abnormal   Collection Time: 01/13/15  8:24 AM  Result Value Ref Range   Valproic Acid Lvl 40 (L) 50.0 - 100.0 ug/mL  Comprehensive metabolic panel     Status: Abnormal   Collection Time: 01/13/15  8:24 AM  Result Value Ref Range   Sodium 140 135 - 145 mmol/L   Potassium 5.2 (H) 3.5 - 5.1 mmol/L   Chloride 106 101 - 111 mmol/L   CO2 29 22 - 32 mmol/L   Glucose, Bld 85 65 - 99 mg/dL   BUN 33 (H) 6 - 20 mg/dL   Creatinine, Ser 3.43 (H) 0.44 - 1.00 mg/dL   Calcium 9.8 8.9 - 10.3 mg/dL   Total Protein 6.4 (L) 6.5 - 8.1 g/dL   Albumin 3.0 (L) 3.5 - 5.0 g/dL   AST 19 15 - 41 U/L   ALT 15 14 - 54 U/L   Alkaline Phosphatase 108 38 - 126 U/L   Total Bilirubin 0.2 (L) 0.3 - 1.2 mg/dL   GFR calc non Af Amer 14 (L) >60 mL/min   GFR calc Af Amer 16 (L) >60 mL/min    Comment: (NOTE) The eGFR has been calculated using the CKD EPI equation. This calculation has not been validated   in all clinical situations. eGFR's persistently <60 mL/min signify possible Chronic Kidney Disease.    Anion gap 5 5 - 15  CBC     Status: Abnormal   Collection Time: 01/13/15  9:21 AM  Result Value Ref Range   WBC 3.5 (L) 3.6 - 11.0 K/uL   RBC 3.26 (L) 3.80 - 5.20 MIL/uL   Hemoglobin 10.2 (L) 12.0 - 16.0 g/dL   HCT 32.4 (L) 35.0 - 47.0 %   MCV 99.2 80.0 - 100.0 fL   MCH 31.3 26.0  - 34.0 pg   MCHC 31.5 (L) 32.0 - 36.0 g/dL   RDW 19.4 (H) 11.5 - 14.5 %   Platelets 167 150 - 440 K/uL    Physical Findings: AIMS: Facial and Oral Movements Muscles of Facial Expression: Minimal Lips and Perioral Area: Minimal Jaw: None, normal Tongue: None, normal,Extremity Movements Upper (arms, wrists, hands, fingers): None, normal Lower (legs, knees, ankles, toes): None, normal, Trunk Movements Neck, shoulders, hips: None, normal, Overall Severity Severity of abnormal movements (highest score from questions above): None, normal Incapacitation due to abnormal movements: None, normal Patient's awareness of abnormal movements (rate only patient's report): No Awareness, Dental Status Current problems with teeth and/or dentures?: Yes Does patient usually wear dentures?: Yes  CIWA:  CIWA-Ar Total: 0 COWS:  COWS Total Score: 0   See Psychiatric Specialty Exam and Suicide Risk Assessment completed by Attending Physician prior to discharge.  Discharge destination:  Home  Is patient on multiple antipsychotic therapies at discharge:  No   Has Patient had three or more failed trials of antipsychotic monotherapy by history:  No    Recommended Plan for Multiple Antipsychotic Therapies: NA  Discharge Instructions    Diet - low sodium heart healthy    Complete by:  As directed      Increase activity slowly    Complete by:  As directed             Medication List    TAKE these medications      Indication   acetaminophen 325 MG tablet  Commonly known as:  TYLENOL  Take 2 tablets (650 mg total) by mouth every 4 (four) hours as needed for fever, headache or mild pain.      albuterol 108 (90 BASE) MCG/ACT inhaler  Commonly known as:  PROVENTIL HFA;VENTOLIN HFA  Inhale 2 puffs into the lungs every 6 (six) hours as needed for wheezing or shortness of breath.   Indication:  Chronic Obstructive Lung Disease     bisacodyl 5 MG EC tablet  Commonly known as:  DULCOLAX  Take 1  tablet (5 mg total) by mouth daily as needed for moderate constipation.      cinacalcet 60 MG tablet  Commonly known as:  SENSIPAR  Take 60 mg by mouth daily.      Darbepoetin Alfa 60 MCG/0.3ML Sosy injection  Commonly known as:  ARANESP  Inject 0.3 mLs (60 mcg total) into the vein every Thursday with hemodialysis.      divalproex 500 MG 24 hr tablet  Commonly known as:  DEPAKOTE ER  Take 1-2 tablets (500-1,000 mg total) by mouth 2 (two) times daily. Take 500 mg in the morning and 1000 mg in the evening.   Indication:  Rapidly Alternating Manic-Depressive Psychosis     DSS 100 MG Caps  Take 100 mg by mouth 2 (two) times daily.      feeding supplement (NEPRO CARB STEADY) Liqd  Take 237 mLs by mouth as  needed (missed meal during dialysis.).      ibuprofen 200 MG tablet  Commonly known as:  ADVIL,MOTRIN  Take 600 mg by mouth every 6 (six) hours as needed for mild pain, moderate pain or cramping.      LORazepam 0.5 MG tablet  Commonly known as:  ATIVAN  Take 0.5 mg by mouth daily as needed for anxiety.      metoprolol tartrate 25 MG tablet  Commonly known as:  LOPRESSOR  Take 0.5 tablets (12.5 mg total) by mouth 2 (two) times daily. For HTN   Indication:  High Blood Pressure     metoprolol tartrate 25 MG tablet  Commonly known as:  LOPRESSOR  Take 0.5 tablets (12.5 mg total) by mouth 2 (two) times daily.   Indication:  High Blood Pressure     mometasone-formoterol 100-5 MCG/ACT Aero  Commonly known as:  DULERA  Inhale 2 puffs into the lungs 2 (two) times daily.   Indication:  Asthma     pantoprazole 40 MG tablet  Commonly known as:  PROTONIX  Take 1 tablet (40 mg total) by mouth daily.      ziprasidone 60 MG capsule  Commonly known as:  GEODON  Take 1 capsule (60 mg total) by mouth 2 (two) times daily with a meal. For mood control   Indication:  Manic-Depression, mood stabilization / psychosis      ASK your doctor about these medications      Indication   doxepin  25 MG capsule  Commonly known as:  SINEQUAN  Take 25 mg by mouth at bedtime.      ferric gluconate 250 mg in sodium chloride 0.9 % 100 mL  Inject 250 mg into the vein every Tuesday, Thursday, Saturday, and Sunday.          Follow-up recommendations:  Activity:  as tolerated. Diet:  heart healthy. Other:  keep follow-up appointments.  Comments:    Total Discharge Time: 35 min.  Signed: Lelania Bia 01/13/2015, 11:09 AM

## 2015-01-13 NOTE — Progress Notes (Signed)
Pt denies SI/HI/AVH. Pt given discharge instructions including prescriptions, medication samples, and f/u appointments. Pt states understanding. Pt states receipt of all belongings.   

## 2015-01-13 NOTE — Progress Notes (Signed)
  [facility] Adult Case Management Discharge Plan :  Will you be returning to the same living situation after discharge:  Yes,  home with daughter At discharge, do you have transportation home?: Yes,  daughter will pick up [employer] Do you have the ability to pay for your medications: Yes,  patient has insurance  Release of information consent forms completed and in the chart;  Patient's signature needed at discharge.  Patient to Follow up at: Follow-up Information    Follow up with [facility] . Go on 01/19/2015.   Why:  For follow-up care appointment Monday 01/19/15 at 2:20pm    Contact information:   [place], [place] Ph [phone] Fax [phone]       Patient denies SI/HI: Yes,  patient denies SI/HI    Safety Planning and Suicide Prevention discussed: Yes,  SPE discussed with patient and her daughter     Has patient been referred to the Quitline?: N/A patient is not a smoker  Keene Breath, MSW, LCSWA 01/13/2015, 2:06 PM

## 2015-01-13 NOTE — Progress Notes (Signed)
Patient alert, remains confused rt plan of care. Cooperative with meds this am, presents to med room independently this am, but remains focused on wanting to discharge today. Fair appetite, disheveled hair. Does comb her hair when prompted. Minimal interaction with peers. Disorganized thoughts and speech. No SI/HI/AVH at this time. Patient to have Dialysis today at 12 pm. Safety maintained.

## 2015-01-13 NOTE — [facility] Group Notes (Signed)
[facility] Notes:  (Nursing/MHT/Case Management/Adjunct)  Date:  01/13/2015  Time:  3:46 PM  Type of Therapy:  Psychoeducational Skills  Participation Level:  Did Not Attend   Celso Amy 01/13/2015, 3:46 PM

## 2015-01-13 NOTE — Plan of Care (Signed)
Problem: Spiritual Needs Goal: Ability to function at adequate level Outcome: Progressing Patient remains confused. Cooperative with meds this am, presents to med room independently. Remains focused on discharge.

## 2015-01-13 NOTE — [facility] Suicide Risk Assessment (Signed)
[facility] Discharge Suicide Risk Assessment   Demographic Factors:  NA  Total Time spent with patient: 30 minutes  Musculoskeletal: Strength & Muscle Tone: within normal limits Gait & Station: normal Patient leans: N/A  Psychiatric Specialty Exam: Physical Exam  Nursing note and vitals reviewed.   Review of Systems  All other systems reviewed and are negative.   Blood pressure 148/101, pulse 88, temperature 98.7 F (37.1 C), temperature source Oral, resp. rate 20, weight 88.6 kg (195 lb 5.2 oz), last menstrual period 05/09/2008, SpO2 98 %.Body mass index is 35.72 kg/(m^2).  General Appearance: Casual  Eye Contact::  Good  Speech:  Clear and [id number]  Volume:  Normal  Mood:  Euthymic  Affect:  Appropriate  Thought Process:  Goal Directed  Orientation:  Full (Time, Place, and Person)  Thought Content:  WDL  Suicidal Thoughts:  No  Homicidal Thoughts:  No  Memory:  Immediate;   Fair Recent;   Fair Remote;   Fair  Judgement:  Fair  Insight:  Fair  Psychomotor Activity:  Normal  Concentration:  Fair  Recall:  [employer] [place]  Language: Fair  Akathisia:  No  Handed:  Right  AIMS (if indicated):     Assets:  Communication Skills Desire for Improvement Financial Resources/Insurance Housing Social Support  Sleep:  Number of Hours: 4  Cognition: WNL  ADL's:  Intact      Has this patient used any form of tobacco in the last 30 days? (Cigarettes, Smokeless Tobacco, Cigars, and/or Pipes) Yes, A prescription for an FDA-approved tobacco cessation medication was offered at discharge and the patient refused  Mental Status Per Nursing Assessment::   On Admission:     Current Mental Status by Physician: NA  Loss Factors: Decline in physical health  Historical Factors: NA  Risk Reduction Factors:   Responsible for children under 60 years of age, Sense of responsibility to family, Religious beliefs about death, Living with another person, especially a  relative, Positive social support and Positive therapeutic relationship  Continued Clinical Symptoms:  Bipolar Disorder:   Mixed State  Cognitive Features That Contribute To Risk:  None    Suicide Risk:  Minimal: No identifiable suicidal ideation.  Patients presenting with no risk factors but with morbid ruminations; may be classified as minimal risk based on the severity of the depressive symptoms  Principal Problem: Bipolar affective disorder, mixed, severe, with psychotic behavior Discharge Diagnoses:  Patient Active Problem List   Diagnosis Date Noted  . Bipolar affective disorder, mixed, severe, with psychotic behavior [F31.64] 01/05/2015  . Pain in the chest [R07.9]   . Pleuritic chest pain [R07.81] 07/31/2014  . Symptomatic anemia [D64.9] 07/29/2014  . Absolute anemia [D64.9]   . CKD (chronic kidney disease) stage V requiring chronic dialysis [N18.6, Z99.2] 05/13/2014  . Salicylate overdose [Z36.644I] 05/13/2014  . ESRD (end stage renal disease) [N18.6] 01/16/2014  . MDD (major depressive disorder), recurrent, severe, with psychosis [F33.3] 09/29/2013  . COPD (chronic obstructive pulmonary disease) [J44.9] 09/26/2013  . COPD exacerbation [J44.1] 04/24/2013  . Hyperkalemia [E87.5] 04/23/2013  . Acute respiratory failure [J96.00] 04/23/2013  . Hypercalcemia [E83.52] 02/20/2013  . Hyperparathyroidism, primary [E21.0] 02/20/2013  . Tobacco use disorder [Z72.0] 11/13/2012  . Overdose of salicylate [H47.425Z] 56/38/7564  . HTN (hypertension) [I10] 02/20/2012      Plan Of Care/Follow-up recommendations:  Activity:  as tolerated. Diet:  Heart healthy. Other:  keep follow up appointments.  Is patient on multiple antipsychotic therapies at discharge:  No  Has Patient had three or more failed trials of antipsychotic monotherapy by history:  No  Recommended Plan for Multiple Antipsychotic Therapies: NA    Jennifer Lawson 01/13/2015, 11:05 AM

## 2015-01-13 NOTE — [facility] Suicide Risk Assessment (Signed)
[facility] INPATIENT:  Family/Significant Other Suicide Prevention Education  Suicide Prevention Education:  Education Completed; Naryah Clenney (daughter) [phone] has been identified by the patient as the family member/significant other with whom the patient will be residing, and identified as the person(s) who will aid the patient in the event of a mental health crisis (suicidal ideations/suicide attempt).  With written consent from the patient, the family member/significant other has been provided the following suicide prevention education, prior to the and/or following the discharge of the patient.  The suicide prevention education provided includes the following:  Suicide risk factors  Suicide prevention and interventions  National Suicide Hotline telephone number  [facility] assessment telephone number  [facility] Emergency Assistance [place] and/or Residential Mobile Crisis Unit telephone number  Request made of family/significant other to:  Remove weapons (e.g., guns, rifles, knives), all items previously/currently identified as safety concern.    Remove drugs/medications (over-the-counter, prescriptions, illicit drugs), all items previously/currently identified as a safety concern.  The family member/significant other verbalizes understanding of the suicide prevention education information provided.  The family member/significant other agrees to remove the items of safety concern listed above.  Keene Breath, MSW, LCSWA 01/13/2015, 12:21 PM

## 2015-01-17 DIAGNOSIS — J449 Chronic obstructive pulmonary disease, unspecified: Secondary | ICD-10-CM | POA: Diagnosis not present

## 2015-01-17 DIAGNOSIS — N186 End stage renal disease: Secondary | ICD-10-CM | POA: Diagnosis not present

## 2015-01-18 DIAGNOSIS — R269 Unspecified abnormalities of gait and mobility: Secondary | ICD-10-CM | POA: Diagnosis not present

## 2015-01-18 DIAGNOSIS — J449 Chronic obstructive pulmonary disease, unspecified: Secondary | ICD-10-CM | POA: Diagnosis not present

## 2015-01-20 DIAGNOSIS — R52 Pain, unspecified: Secondary | ICD-10-CM | POA: Diagnosis not present

## 2015-01-20 DIAGNOSIS — N186 End stage renal disease: Secondary | ICD-10-CM | POA: Diagnosis not present

## 2015-01-21 DIAGNOSIS — R52 Pain, unspecified: Secondary | ICD-10-CM | POA: Diagnosis not present

## 2015-01-21 DIAGNOSIS — N186 End stage renal disease: Secondary | ICD-10-CM | POA: Diagnosis not present

## 2015-01-26 ENCOUNTER — Emergency Department (HOSPITAL_COMMUNITY): Payer: Medicare Other

## 2015-01-26 ENCOUNTER — Encounter (HOSPITAL_COMMUNITY): Payer: Self-pay

## 2015-01-26 ENCOUNTER — Inpatient Hospital Stay (HOSPITAL_COMMUNITY)
Admission: EM | Admit: 2015-01-26 | Discharge: 2015-01-29 | DRG: 682 | Disposition: A | Discharge status: Home / Self Care | Payer: Medicare Other | Attending: Internal Medicine | Admitting: Internal Medicine

## 2015-01-26 DIAGNOSIS — R5383 Other fatigue: Secondary | ICD-10-CM

## 2015-01-26 DIAGNOSIS — R509 Fever, unspecified: Secondary | ICD-10-CM | POA: Diagnosis not present

## 2015-01-26 DIAGNOSIS — Z8249 Family history of ischemic heart disease and other diseases of the circulatory system: Secondary | ICD-10-CM

## 2015-01-26 DIAGNOSIS — N186 End stage renal disease: Secondary | ICD-10-CM

## 2015-01-26 DIAGNOSIS — J9690 Respiratory failure, unspecified, unspecified whether with hypoxia or hypercapnia: Secondary | ICD-10-CM | POA: Diagnosis not present

## 2015-01-26 DIAGNOSIS — R410 Disorientation, unspecified: Secondary | ICD-10-CM

## 2015-01-26 DIAGNOSIS — E1122 Type 2 diabetes mellitus with diabetic chronic kidney disease: Secondary | ICD-10-CM | POA: Diagnosis not present

## 2015-01-26 DIAGNOSIS — J9602 Acute respiratory failure with hypercapnia: Secondary | ICD-10-CM | POA: Diagnosis present

## 2015-01-26 DIAGNOSIS — F17210 Nicotine dependence, cigarettes, uncomplicated: Secondary | ICD-10-CM | POA: Diagnosis present

## 2015-01-26 DIAGNOSIS — Z992 Dependence on renal dialysis: Secondary | ICD-10-CM

## 2015-01-26 DIAGNOSIS — I120 Hypertensive chronic kidney disease with stage 5 chronic kidney disease or end stage renal disease: Principal | ICD-10-CM | POA: Diagnosis present

## 2015-01-26 DIAGNOSIS — Z833 Family history of diabetes mellitus: Secondary | ICD-10-CM

## 2015-01-26 DIAGNOSIS — K219 Gastro-esophageal reflux disease without esophagitis: Secondary | ICD-10-CM | POA: Diagnosis present

## 2015-01-26 DIAGNOSIS — J811 Chronic pulmonary edema: Secondary | ICD-10-CM | POA: Diagnosis not present

## 2015-01-26 DIAGNOSIS — F333 Major depressive disorder, recurrent, severe with psychotic symptoms: Secondary | ICD-10-CM | POA: Diagnosis not present

## 2015-01-26 DIAGNOSIS — E872 Acidosis, unspecified: Secondary | ICD-10-CM

## 2015-01-26 DIAGNOSIS — F4310 Post-traumatic stress disorder, unspecified: Secondary | ICD-10-CM | POA: Diagnosis not present

## 2015-01-26 DIAGNOSIS — R569 Unspecified convulsions: Secondary | ICD-10-CM | POA: Diagnosis not present

## 2015-01-26 DIAGNOSIS — J45909 Unspecified asthma, uncomplicated: Secondary | ICD-10-CM | POA: Diagnosis not present

## 2015-01-26 DIAGNOSIS — R4182 Altered mental status, unspecified: Secondary | ICD-10-CM | POA: Diagnosis not present

## 2015-01-26 DIAGNOSIS — R011 Cardiac murmur, unspecified: Secondary | ICD-10-CM | POA: Diagnosis present

## 2015-01-26 DIAGNOSIS — D631 Anemia in chronic kidney disease: Secondary | ICD-10-CM | POA: Diagnosis present

## 2015-01-26 DIAGNOSIS — Z888 Allergy status to other drugs, medicaments and biological substances status: Secondary | ICD-10-CM | POA: Diagnosis not present

## 2015-01-26 DIAGNOSIS — R4781 Slurred speech: Secondary | ICD-10-CM | POA: Diagnosis not present

## 2015-01-26 DIAGNOSIS — N189 Chronic kidney disease, unspecified: Secondary | ICD-10-CM | POA: Diagnosis present

## 2015-01-26 DIAGNOSIS — E875 Hyperkalemia: Secondary | ICD-10-CM | POA: Diagnosis not present

## 2015-01-26 DIAGNOSIS — Z23 Encounter for immunization: Secondary | ICD-10-CM

## 2015-01-26 DIAGNOSIS — Z885 Allergy status to narcotic agent status: Secondary | ICD-10-CM

## 2015-01-26 DIAGNOSIS — Z96651 Presence of right artificial knee joint: Secondary | ICD-10-CM | POA: Diagnosis present

## 2015-01-26 DIAGNOSIS — J449 Chronic obstructive pulmonary disease, unspecified: Secondary | ICD-10-CM | POA: Diagnosis present

## 2015-01-26 DIAGNOSIS — Z79899 Other long term (current) drug therapy: Secondary | ICD-10-CM | POA: Diagnosis not present

## 2015-01-26 DIAGNOSIS — F17200 Nicotine dependence, unspecified, uncomplicated: Secondary | ICD-10-CM | POA: Diagnosis present

## 2015-01-26 DIAGNOSIS — J9811 Atelectasis: Secondary | ICD-10-CM | POA: Diagnosis not present

## 2015-01-26 DIAGNOSIS — R29810 Facial weakness: Secondary | ICD-10-CM | POA: Diagnosis not present

## 2015-01-26 DIAGNOSIS — R1110 Vomiting, unspecified: Secondary | ICD-10-CM

## 2015-01-26 DIAGNOSIS — R0989 Other specified symptoms and signs involving the circulatory and respiratory systems: Secondary | ICD-10-CM | POA: Diagnosis not present

## 2015-01-26 DIAGNOSIS — I10 Essential (primary) hypertension: Secondary | ICD-10-CM | POA: Diagnosis not present

## 2015-01-26 DIAGNOSIS — I6789 Other cerebrovascular disease: Secondary | ICD-10-CM | POA: Diagnosis not present

## 2015-01-26 LAB — I-STAT ARTERIAL BLOOD GAS, ED
Acid-base deficit: 11 mmol/L — ABNORMAL HIGH (ref 0.0–2.0)
Bicarbonate: 16.5 mEq/L — ABNORMAL LOW (ref 20.0–24.0)
O2 Saturation: 91 %
Patient temperature: 98.6
TCO2: 18 mmol/L (ref 0–100)
pCO2 arterial: 42.2 mmHg (ref 35.0–45.0)
pH, Arterial: 7.2 — ABNORMAL LOW (ref 7.350–7.450)
pO2, Arterial: 76 mmHg — ABNORMAL LOW (ref 80.0–100.0)

## 2015-01-26 LAB — COMPREHENSIVE METABOLIC PANEL
ALT: 10 U/L — ABNORMAL LOW (ref 14–54)
AST: 17 U/L (ref 15–41)
Albumin: 3.3 g/dL — ABNORMAL LOW (ref 3.5–5.0)
Alkaline Phosphatase: 117 U/L (ref 38–126)
Anion gap: 15 (ref 5–15)
BUN: 80 mg/dL — ABNORMAL HIGH (ref 6–20)
CO2: 14 mmol/L — ABNORMAL LOW (ref 22–32)
Calcium: 9.1 mg/dL (ref 8.9–10.3)
Chloride: 112 mmol/L — ABNORMAL HIGH (ref 101–111)
Creatinine, Ser: 5.9 mg/dL — ABNORMAL HIGH (ref 0.44–1.00)
GFR calc Af Amer: 8 mL/min — ABNORMAL LOW (ref >60)
GFR calc non Af Amer: 7 mL/min — ABNORMAL LOW (ref >60)
Glucose, Bld: 72 mg/dL (ref 65–99)
Potassium: 5.9 mmol/L — ABNORMAL HIGH (ref 3.5–5.1)
Sodium: 141 mmol/L (ref 135–145)
Total Bilirubin: 0.5 mg/dL (ref 0.3–1.2)
Total Protein: 7.4 g/dL (ref 6.5–8.1)

## 2015-01-26 LAB — CBG MONITORING, ED
Glucose-Capillary: 63 mg/dL — ABNORMAL LOW (ref 65–99)
Glucose-Capillary: 80 mg/dL (ref 65–99)
Glucose-Capillary: 75 mg/dL (ref 65–99)

## 2015-01-26 LAB — VALPROIC ACID LEVEL: Valproic Acid Lvl: 49 ug/mL — ABNORMAL LOW (ref 50.0–100.0)

## 2015-01-26 LAB — I-STAT CHEM 8, ED
BUN: 85 mg/dL — ABNORMAL HIGH (ref 6–20)
Calcium, Ion: 1.08 mmol/L — ABNORMAL LOW (ref 1.12–1.23)
Chloride: 119 mmol/L — ABNORMAL HIGH (ref 101–111)
Creatinine, Ser: 5.7 mg/dL — ABNORMAL HIGH (ref 0.44–1.00)
Glucose, Bld: 70 mg/dL (ref 65–99)
HCT: 38 % (ref 36.0–46.0)
Hemoglobin: 12.9 g/dL (ref 12.0–15.0)
Potassium: 5.9 mmol/L — ABNORMAL HIGH (ref 3.5–5.1)
Sodium: 141 mmol/L (ref 135–145)
TCO2: 16 mmol/L (ref 0–100)

## 2015-01-26 LAB — CBC
HCT: 35.5 % — ABNORMAL LOW (ref 36.0–46.0)
Hemoglobin: 10.9 g/dL — ABNORMAL LOW (ref 12.0–15.0)
MCH: 31.1 pg (ref 26.0–34.0)
MCHC: 30.7 g/dL (ref 30.0–36.0)
MCV: 101.4 fL — ABNORMAL HIGH (ref 78.0–100.0)
Platelets: 199 10*3/uL (ref 150–400)
RBC: 3.5 MIL/uL — ABNORMAL LOW (ref 3.87–5.11)
RDW: 19 % — ABNORMAL HIGH (ref 11.5–15.5)
WBC: 6 10*3/uL (ref 4.0–10.5)

## 2015-01-26 LAB — URINE MICROSCOPIC-ADD ON

## 2015-01-26 LAB — URINALYSIS, ROUTINE W REFLEX MICROSCOPIC
Bilirubin Urine: NEGATIVE
Glucose, UA: NEGATIVE mg/dL
Hgb urine dipstick: NEGATIVE
Ketones, ur: NEGATIVE mg/dL
Leukocytes, UA: NEGATIVE
Nitrite: NEGATIVE
Protein, ur: >300 mg/dL — AB
Specific Gravity, Urine: 1.011 (ref 1.005–1.030)
Urobilinogen, UA: 0.2 mg/dL (ref 0.0–1.0)
pH: 5.5 (ref 5.0–8.0)

## 2015-01-26 LAB — RAPID URINE DRUG SCREEN, HOSP PERFORMED
Amphetamines: NOT DETECTED
Barbiturates: NOT DETECTED
Benzodiazepines: NOT DETECTED
Cocaine: NOT DETECTED
Opiates: NOT DETECTED
Tetrahydrocannabinol: NOT DETECTED

## 2015-01-26 LAB — TROPONIN I: Troponin I: 0.03 ng/mL (ref <0.031)

## 2015-01-26 LAB — ETHANOL: Alcohol, Ethyl (B): <5 mg/dL (ref <5)

## 2015-01-26 LAB — AMMONIA: Ammonia: 29 umol/L (ref 9–35)

## 2015-01-26 LAB — ACETAMINOPHEN LEVEL: Acetaminophen (Tylenol), Serum: <10 ug/mL — ABNORMAL LOW (ref 10–30)

## 2015-01-26 LAB — SALICYLATE LEVEL: Salicylate Lvl: 20.5 mg/dL (ref 2.8–30.0)

## 2015-01-26 MED ORDER — ONDANSETRON HCL 4 MG/2ML IJ SOLN
4.0000 mg | Freq: Four times a day (QID) | INTRAMUSCULAR | Status: DC | PRN
  Administered 2015-01-27: 4 mg via INTRAVENOUS
  Filled 2015-01-26: qty 2

## 2015-01-26 MED ORDER — LORAZEPAM 0.5 MG PO TABS
0.5000 mg | ORAL_TABLET | Freq: Every day | ORAL | Status: DC | PRN
  Administered 2015-01-29: 0.5 mg via ORAL
  Filled 2015-01-26: qty 1

## 2015-01-26 MED ORDER — LORAZEPAM 2 MG/ML IJ SOLN
INTRAMUSCULAR | Status: AC
  Filled 2015-01-26: qty 1

## 2015-01-26 MED ORDER — SODIUM CHLORIDE 0.9 % IV SOLN: 100.0000 mL | INTRAVENOUS | Status: DC | PRN

## 2015-01-26 MED ORDER — SODIUM CHLORIDE 0.9 % IJ SOLN
3.0000 mL | Freq: Two times a day (BID) | INTRAMUSCULAR | Status: DC
  Administered 2015-01-27 – 2015-01-29 (×3): 3 mL via INTRAVENOUS

## 2015-01-26 MED ORDER — ENOXAPARIN SODIUM 30 MG/0.3ML ~~LOC~~ SOLN
30.0000 mg | SUBCUTANEOUS | Status: DC
  Administered 2015-01-27 – 2015-01-28 (×2): 30 mg via SUBCUTANEOUS
  Filled 2015-01-26 (×2): qty 0.3

## 2015-01-26 MED ORDER — CINACALCET HCL 30 MG PO TABS
60.0000 mg | ORAL_TABLET | Freq: Every day | ORAL | Status: DC
  Administered 2015-01-27 – 2015-01-29 (×3): 60 mg via ORAL
  Filled 2015-01-26 (×5): qty 2

## 2015-01-26 MED ORDER — LORAZEPAM 2 MG/ML IJ SOLN: 1.0000 mg | Freq: Once | INTRAMUSCULAR | Status: DC

## 2015-01-26 MED ORDER — PANTOPRAZOLE SODIUM 40 MG PO TBEC
40.0000 mg | DELAYED_RELEASE_TABLET | Freq: Every day | ORAL | Status: DC
  Administered 2015-01-28 – 2015-01-29 (×2): 40 mg via ORAL
  Filled 2015-01-26 (×2): qty 1

## 2015-01-26 MED ORDER — SODIUM CHLORIDE 0.9 % IV SOLN
250.0000 mg | INTRAVENOUS | Status: DC
  Filled 2015-01-26: qty 20

## 2015-01-26 MED ORDER — MOMETASONE FURO-FORMOTEROL FUM 100-5 MCG/ACT IN AERO
2.0000 | INHALATION_SPRAY | Freq: Two times a day (BID) | RESPIRATORY_TRACT | Status: DC
  Administered 2015-01-27 – 2015-01-29 (×5): 2 via RESPIRATORY_TRACT
  Filled 2015-01-26: qty 8.8

## 2015-01-26 MED ORDER — ONDANSETRON HCL 4 MG PO TABS: 4.0000 mg | ORAL_TABLET | Freq: Four times a day (QID) | ORAL | Status: DC | PRN

## 2015-01-26 MED ORDER — ALTEPLASE 2 MG IJ SOLR
2.0000 mg | Freq: Once | INTRAMUSCULAR | Status: DC | PRN
  Filled 2015-01-26: qty 2

## 2015-01-26 MED ORDER — ACETAMINOPHEN 325 MG PO TABS
650.0000 mg | ORAL_TABLET | Freq: Four times a day (QID) | ORAL | Status: DC | PRN
  Administered 2015-01-28 – 2015-01-29 (×3): 650 mg via ORAL
  Filled 2015-01-26 (×3): qty 2

## 2015-01-26 MED ORDER — DOXEPIN HCL 25 MG PO CAPS
25.0000 mg | ORAL_CAPSULE | Freq: Every day | ORAL | Status: DC
  Administered 2015-01-28: 25 mg via ORAL
  Filled 2015-01-26 (×4): qty 1

## 2015-01-26 MED ORDER — LIDOCAINE HCL (PF) 1 % IJ SOLN: 5.0000 mL | INTRAMUSCULAR | Status: DC | PRN

## 2015-01-26 MED ORDER — DIVALPROEX SODIUM ER 500 MG PO TB24
500.0000 mg | ORAL_TABLET | Freq: Two times a day (BID) | ORAL | Status: DC
  Filled 2015-01-26 (×7): qty 1

## 2015-01-26 MED ORDER — DOCUSATE SODIUM 100 MG PO CAPS
100.0000 mg | ORAL_CAPSULE | Freq: Two times a day (BID) | ORAL | Status: DC
  Administered 2015-01-28 – 2015-01-29 (×3): 100 mg via ORAL
  Filled 2015-01-26 (×3): qty 1

## 2015-01-26 MED ORDER — PENTAFLUOROPROP-TETRAFLUOROETH EX AERO
1.0000 "application " | INHALATION_SPRAY | CUTANEOUS | Status: DC | PRN
  Filled 2015-01-26: qty 30

## 2015-01-26 MED ORDER — MOMETASONE FURO-FORMOTEROL FUM 100-5 MCG/ACT IN AERO
2.0000 | INHALATION_SPRAY | Freq: Two times a day (BID) | RESPIRATORY_TRACT | Status: DC
  Filled 2015-01-26: qty 8.8

## 2015-01-26 MED ORDER — SODIUM CHLORIDE 0.9 % IV SOLN: 250.0000 mL | INTRAVENOUS | Status: DC | PRN

## 2015-01-26 MED ORDER — LIDOCAINE-PRILOCAINE 2.5-2.5 % EX CREA
1.0000 "application " | TOPICAL_CREAM | CUTANEOUS | Status: DC | PRN
  Filled 2015-01-26: qty 5

## 2015-01-26 MED ORDER — ZIPRASIDONE HCL 60 MG PO CAPS
60.0000 mg | ORAL_CAPSULE | Freq: Two times a day (BID) | ORAL | Status: DC
  Administered 2015-01-27 – 2015-01-29 (×5): 60 mg via ORAL
  Filled 2015-01-26 (×7): qty 1

## 2015-01-26 MED ORDER — ALBUTEROL SULFATE HFA 108 (90 BASE) MCG/ACT IN AERS: 2.0000 | INHALATION_SPRAY | Freq: Four times a day (QID) | RESPIRATORY_TRACT | Status: DC | PRN

## 2015-01-26 MED ORDER — DEXTROSE 50 % IV SOLN
INTRAVENOUS | Status: AC
Start: 2015-01-26 — End: 2015-01-26
  Administered 2015-01-26: 25 mL
  Filled 2015-01-26: qty 50

## 2015-01-26 MED ORDER — NEPRO/CARBSTEADY PO LIQD: 237.0000 mL | ORAL | Status: DC | PRN

## 2015-01-26 MED ORDER — DEXTROSE 50 % IV SOLN
25.0000 mL | Freq: Once | INTRAVENOUS | Status: AC
  Administered 2015-01-26: 25 mL via INTRAVENOUS

## 2015-01-26 MED ORDER — SODIUM CHLORIDE 0.9 % IJ SOLN: 3.0000 mL | INTRAMUSCULAR | Status: DC | PRN

## 2015-01-26 MED ORDER — BISACODYL 5 MG PO TBEC
5.0000 mg | DELAYED_RELEASE_TABLET | Freq: Every day | ORAL | Status: DC | PRN
  Administered 2015-01-28: 5 mg via ORAL
  Filled 2015-01-26: qty 1

## 2015-01-26 MED ORDER — METOPROLOL TARTRATE 12.5 MG HALF TABLET: 12.5000 mg | ORAL_TABLET | Freq: Two times a day (BID) | ORAL | Status: DC

## 2015-01-26 NOTE — Progress Notes (Signed)
Called [place] in ED. Report received. Awaiting patient arrival to unit.

## 2015-01-26 NOTE — ED Notes (Signed)
cbg 80 

## 2015-01-26 NOTE — Consult Note (Signed)
Reason for Consult: To manage dialysis and dialysis related needs Referring Physician: Annelyse Rey is an 54 y.o. female with PMhx significant for ESRD - on HD at [facility] although noted to be noncompliant mostly due to psychiatric d/o likely bipolar .  She also has HTN, COPD, anemia.  She is s/p a hospitalization at [facility] from 8/29 to 9/6 for evaluation of mental status after missing HD for 10 days- it is noted she seemed better on depakote and ativan and was discharged on 9/6.  She was noted to go to HD on 9/13 and 9/14 and had not been back since.  She comes in via EMS today wth c/o altered mental status- history taking is difficult.  CXR shows pulmonary edema - labs show K of 5.9, BUN and crt of 80 and 5.9 with also pH of 7.2 and bicarb of 14- she will need to be admitted and we are asked to provide HD.  Again , history taking is difficult- she is arousable but non verbal  Dialyzes at [facility] 2/2, Dialyzer 180, Heparin none. Access AVF.  mircera 150 given 8/6- none given since at [place].  Sensipar 60 daily- no binders on list  Past Medical History  Diagnosis Date  . Mental disorder   . Depression   . Hypertension   . Overdose   . Tobacco use disorder 11/13/2012  . Complication of anesthesia     difficulty going to sleep  . Chronic kidney disease     06/11/13- not on dialysis  . Shortness of breath     lying down flat  . PTSD (post-traumatic stress disorder)   . Asthma   . COPD (chronic obstructive pulmonary disease)   . Heart murmur   . GERD (gastroesophageal reflux disease)   . Seizures     "passsed out"  . History of blood transfusion   . Diabetes mellitus without complication     denies    Past Surgical History  Procedure Laterality Date  . Right knee replacement      she says it was last year.  . Esophagogastroduodenoscopy Left 11/14/2012    Procedure: ESOPHAGOGASTRODUODENOSCOPY (EGD);  Surgeon: [name], MD;  Location: [facility];  Service:  Endoscopy;  Laterality: Left;  . Joint replacement Right     knee  . Parathyroidectomy    . Av fistula placement Right 06/12/2013    Procedure: ARTERIOVENOUS (AV) FISTULA CREATION; RIGHT  BASILIC VEIN TRANSPOSITION with Intraoperative ultrasound;  Surgeon: Mal [name], MD;  Location: [facility] OR;  Service: Vascular;  Laterality: Right;    Family History  Problem Relation Age of Onset  . Diabetes Mother   . Hyperlipidemia Mother   . Hypertension Mother   . Diabetes Father   . Hypertension Father   . Hyperlipidemia Father     Social History:  reports that she has been smoking Cigarettes and Cigars.  She has a 80 pack-year smoking history. She has never used smokeless tobacco. She reports that she does not drink alcohol or use illicit drugs.  Allergies:  Allergies  Allergen Reactions  . Codeine Sulfate Anaphylaxis    Daughter called about having this allergy   . Depakote [Divalproex Sodium] Other (See Comments)    Nose bleeds  . Gabapentin Other (See Comments)    seizures  . Haldol [Haloperidol Lactate] Shortness Of Breath  . Risperidone And Related Shortness Of Breath  . Trazodone And Nefazodone Other (See Comments)    Makes  pt lose balance and fall  . Invega [Paliperidone Er] Nausea And Vomiting  . Vistaril [Hydroxyzine Hcl] Nausea And Vomiting    Medications: I have reviewed the patient's current medications.   Results for orders placed or performed during the hospital encounter of 01/26/15 (from the past 48 hour(s))  Ethanol     Status: None   Collection Time: 01/26/15  4:23 PM  Result Value Ref Range   Alcohol, Ethyl (B) <5 <5 mg/dL    Comment:        LOWEST DETECTABLE LIMIT FOR SERUM ALCOHOL IS 5 mg/dL FOR MEDICAL PURPOSES ONLY   I-stat Chem 8, ED     Status: Abnormal   Collection Time: 01/26/15  4:31 PM  Result Value Ref Range   Sodium 141 135 - 145 mmol/L   Potassium 5.9 (H) 3.5 - 5.1 mmol/L   Chloride 119 (H) 101 - 111 mmol/L   BUN 85 (H) 6 - 20 mg/dL    Creatinine, Ser 5.70 (H) 0.44 - 1.00 mg/dL   Glucose, Bld 70 65 - 99 mg/dL   Calcium, Ion 1.08 (L) 1.12 - 1.23 mmol/L   TCO2 16 0 - 100 mmol/L   Hemoglobin 12.9 12.0 - 15.0 g/dL   HCT 38.0 36.0 - 46.0 %  Comprehensive metabolic panel     Status: Abnormal   Collection Time: 01/26/15  4:37 PM  Result Value Ref Range   Sodium 141 135 - 145 mmol/L   Potassium 5.9 (H) 3.5 - 5.1 mmol/L    Comment: SLIGHT HEMOLYSIS   Chloride 112 (H) 101 - 111 mmol/L   CO2 14 (L) 22 - 32 mmol/L   Glucose, Bld 72 65 - 99 mg/dL   BUN 80 (H) 6 - 20 mg/dL   Creatinine, Ser 5.90 (H) 0.44 - 1.00 mg/dL   Calcium 9.1 8.9 - 10.3 mg/dL   Total Protein 7.4 6.5 - 8.1 g/dL   Albumin 3.3 (L) 3.5 - 5.0 g/dL   AST 17 15 - 41 U/L   ALT 10 (L) 14 - 54 U/L   Alkaline Phosphatase 117 38 - 126 U/L   Total Bilirubin 0.5 0.3 - 1.2 mg/dL   GFR calc non Af Amer 7 (L) >60 mL/min   GFR calc Af Amer 8 (L) >60 mL/min    Comment: (NOTE) The eGFR has been calculated using the CKD EPI equation. This calculation has not been validated in all clinical situations. eGFR's persistently <60 mL/min signify possible Chronic Kidney Disease.    Anion gap 15 5 - 15  CBC     Status: Abnormal   Collection Time: 01/26/15  4:37 PM  Result Value Ref Range   WBC 6.0 4.0 - 10.5 K/uL   RBC 3.50 (L) 3.87 - 5.11 MIL/uL   Hemoglobin 10.9 (L) 12.0 - 15.0 g/dL   HCT 35.5 (L) 36.0 - 46.0 %   MCV 101.4 (H) 78.0 - 100.0 fL   MCH 31.1 26.0 - 34.0 pg   MCHC 30.7 30.0 - 36.0 g/dL   RDW 19.0 (H) 11.5 - 15.5 %   Platelets 199 150 - 400 K/uL  Ammonia     Status: None   Collection Time: 01/26/15  4:37 PM  Result Value Ref Range   Ammonia 29 9 - 35 umol/L  Valproic acid level     Status: Abnormal   Collection Time: 01/26/15  4:37 PM  Result Value Ref Range   Valproic Acid Lvl 49 (L) 50.0 - 100.0 ug/mL  Acetaminophen level  Status: Abnormal   Collection Time: 01/26/15  4:37 PM  Result Value Ref Range   Acetaminophen (Tylenol), Serum <10 (L) 10 - 30  ug/mL    Comment:        THERAPEUTIC CONCENTRATIONS VARY SIGNIFICANTLY. A RANGE OF 10-30 ug/mL MAY BE AN EFFECTIVE CONCENTRATION FOR MANY PATIENTS. HOWEVER, SOME ARE BEST TREATED AT CONCENTRATIONS OUTSIDE THIS RANGE. ACETAMINOPHEN CONCENTRATIONS >150 ug/mL AT 4 HOURS AFTER INGESTION AND >50 ug/mL AT 12 HOURS AFTER INGESTION ARE OFTEN ASSOCIATED WITH TOXIC REACTIONS.   Salicylate level     Status: None   Collection Time: 01/26/15  4:37 PM  Result Value Ref Range   Salicylate Lvl 20.5 2.8 - 30.0 mg/dL  Troponin I     Status: None   Collection Time: 01/26/15  4:37 PM  Result Value Ref Range   Troponin I 0.03 <0.031 ng/mL    Comment:        NO INDICATION OF MYOCARDIAL INJURY.   I-Stat arterial blood gas, ED     Status: Abnormal   Collection Time: 01/26/15  5:10 PM  Result Value Ref Range   pH, Arterial 7.200 (L) 7.350 - 7.450   pCO2 arterial 42.2 35.0 - 45.0 mmHg   pO2, Arterial 76.0 (L) 80.0 - 100.0 mmHg   Bicarbonate 16.5 (L) 20.0 - 24.0 mEq/L   TCO2 18 0 - 100 mmol/L   O2 Saturation 91.0 %   Acid-base deficit 11.0 (H) 0.0 - 2.0 mmol/L   Patient temperature 98.6 F    Collection site RADIAL, ALLEN'S TEST ACCEPTABLE    Drawn by Operator    Sample type ARTERIAL     Dg Chest Port 1 View  01/26/2015   CLINICAL DATA:  Renal failure air and missed dialysis treatment with altered mental status  EXAM: PORTABLE CHEST - 1 VIEW  COMPARISON:  10/13/2014  FINDINGS: Cardiac shadow is enlarged. Some mild prominence of the central pulmonary vascularity is noted likely related to fluid overload from the missed dialysis session. Patient is rotated to the right accentuating the mediastinal markings. Mild bibasilar atelectatic changes are seen.  IMPRESSION: Mild central vascular congestion likely related to volume overload.  Mild bibasilar atelectatic changes.   Electronically Signed   By: Mark  Lukens M.D.   On: 01/26/2015 16:37    ROS: unable to be obtained  Blood pressure 157/83, pulse  110, temperature 98.8 F (37.1 C), temperature source Oral, resp. rate 21, height 5' 5" (1.651 m), weight 77.111 kg (170 lb), last menstrual period 05/09/2008, SpO2 96 %. General appearance: moderately obese and arousable to only deep stimuli- non verbal Resp: bronchophony bilaterally Cardio: regular rate and rhythm, S1, S2 normal, no murmur, click, rub or gallop GI: soft, non-tender; bowel sounds normal; no masses,  no organomegaly Extremities: edema 1+ Neurologic: Mental status: Alert, oriented, thought content appropriate, is not at all- pt only arousable to dee4p stimuli has a right upper arm AVF with good thrill and bruit  Assessment/Plan: 54 year old BF with ESRD and bipolar disorder s/p recent hosp at [facility] where meds were adjusted, now comes in - no HD for 5 days decreased MS 1 Decreased MS- unknown etiology- possibilities include psych/meds- possibly some metabolic derangement due to uremia- work up per primary- HCT?  and dialysis will be provided 2 ESRD: normal schedule is TTS at [facility] but has not attended regularly- will do treatment tonight off schedule 3 Hypertension: likely due to volume- should improve with HD- also on lopressor 4. Anemia of ESRD: on   mircera as OP but does not get consistently- nothing needed for now 5. Metabolic Bone Disease: med list says sensipar 60 daily which will be continued- no binders on med list.  Check labs and follow 6. Psych- seems to be major component here- on geodon/doxepin/ativan/depakote - per primary  7. Metabolic acidosis- likely due to her ESRD- should improve after HD  GOLDSBOROUGH,KELLIE A 01/26/2015, 6:15 PM

## 2015-01-26 NOTE — ED Notes (Addendum)
Attempted to call report, told by unit secretary that Zigmund Daniel, RN will call back to receive report.

## 2015-01-26 NOTE — ED Provider Notes (Signed)
CSN: [id number]     Arrival date & time 01/26/15  1537 History   First MD Initiated Contact with Patient 01/26/15 1603     Chief Complaint  Patient presents with  . Altered Mental Status     (Consider location/radiation/quality/duration/timing/severity/associated sxs/prior Treatment) HPI Patient history is from ancillary staff via EMS. Reportedly the patient was confused and somnolent at home. She has been poorly responsive. Duration of these symptoms is unknown at this time. No family members are currently present to give additional history. The patient has missed recent dialysis appointments. Patient has a significant psychiatric history as well. At this time awaiting further history from any family members who may arrive. Patient's speech is confused and garbled without any appropriate history provided. Past Medical History  Diagnosis Date  . Mental disorder   . Depression   . Hypertension   . Overdose   . Tobacco use disorder 11/13/2012  . Complication of anesthesia     difficulty going to sleep  . Chronic kidney disease     06/11/13- not on dialysis  . Shortness of breath     lying down flat  . PTSD (post-traumatic stress disorder)   . Asthma   . COPD (chronic obstructive pulmonary disease)   . Heart murmur   . GERD (gastroesophageal reflux disease)   . Seizures     "passsed out"  . History of blood transfusion   . Diabetes mellitus without complication     denies   Past Surgical History  Procedure Laterality Date  . Right knee replacement      she says it was last year.  . Esophagogastroduodenoscopy Left 11/14/2012    Procedure: ESOPHAGOGASTRODUODENOSCOPY (EGD);  Surgeon: [name], MD;  Location: [facility];  Service: Endoscopy;  Laterality: Left;  . Joint replacement Right     knee  . Parathyroidectomy    . Av fistula placement Right 06/12/2013    Procedure: ARTERIOVENOUS (AV) FISTULA CREATION; RIGHT  BASILIC VEIN TRANSPOSITION with Intraoperative ultrasound;   Surgeon: Mal [name], MD;  Location: [facility] OR;  Service: Vascular;  Laterality: Right;   Family History  Problem Relation Age of Onset  . Diabetes Mother   . Hyperlipidemia Mother   . Hypertension Mother   . Diabetes Father   . Hypertension Father   . Hyperlipidemia Father    Social History  Substance Use Topics  . Smoking status: Current Every Day Smoker -- 2.00 packs/day for 40 years    Types: Cigarettes, Cigars  . Smokeless tobacco: Never Used  . Alcohol Use: No     Comment: none in over a month per daughter   OB History    No data available     Review of Systems Cannot obtain review systems due to confusion   Allergies  Codeine sulfate; Depakote; Gabapentin; Haldol; Risperidone and related; Trazodone and nefazodone; Invega; and Vistaril  Home Medications   Prior to Admission medications   Medication Sig Start Date End Date Taking? Authorizing Provider  acetaminophen (TYLENOL) 325 MG tablet Take 2 tablets (650 mg total) by mouth every 4 (four) hours as needed for fever, headache or mild pain. 05/20/14  Yes Srikar Janna Arch, MD  albuterol (PROVENTIL HFA;VENTOLIN HFA) 108 (90 BASE) MCG/ACT inhaler Inhale 2 puffs into the lungs every 6 (six) hours as needed for wheezing or shortness of breath. 01/13/15  Yes Jolanta B Pucilowska, MD  cinacalcet (SENSIPAR) 60 MG tablet Take 60 mg by mouth daily.   Yes Historical Provider, MD  divalproex (  DEPAKOTE ER) 250 MG 24 hr tablet Take 500 mg by mouth 2 (two) times daily. 01/19/15  Yes Historical Provider, MD  doxepin (SINEQUAN) 25 MG capsule Take 25 mg by mouth at bedtime. 09/12/14  Yes Historical Provider, MD  ibuprofen (ADVIL,MOTRIN) 200 MG tablet Take 600 mg by mouth every 6 (six) hours as needed for mild pain, moderate pain or cramping.   Yes Historical Provider, MD  LORazepam (ATIVAN) 0.5 MG tablet Take 0.5 mg by mouth daily as needed for anxiety.   Yes Historical Provider, MD  simvastatin (ZOCOR) 10 MG tablet Take 10 mg by mouth at  bedtime. 01/17/15  Yes Historical Provider, MD  ziprasidone (GEODON) 60 MG capsule Take 1 capsule (60 mg total) by mouth 2 (two) times daily with a meal. For mood control 01/13/15  Yes Jolanta B Pucilowska, MD  amoxicillin-clavulanate (AUGMENTIN) 500-125 MG per tablet Take 1 tablet (500 mg total) by mouth daily. 01/29/15   Verlee Monte, MD  Artificial Saliva (BIOTENE ORALBALANCE DRY MOUTH) LIQD Rinse your mouth every 6 hours. 01/29/15   Verlee Monte, MD  bisacodyl (DULCOLAX) 5 MG EC tablet Take 1 tablet (5 mg total) by mouth daily as needed for moderate constipation. Patient not taking: Reported on 10/13/2014 05/20/14   Bynum Bellows, MD  Darbepoetin Alfa (ARANESP) 60 MCG/0.3ML SOSY injection Inject 0.3 mLs (60 mcg total) into the vein every Thursday with hemodialysis. 05/20/14   Srikar Janna Arch, MD  docusate sodium 100 MG CAPS Take 100 mg by mouth 2 (two) times daily. Patient not taking: Reported on 10/13/2014 05/20/14   Bynum Bellows, MD  ferric gluconate 250 mg in sodium chloride 0.9 % 100 mL Inject 250 mg into the vein every Tuesday, Thursday, Saturday, and Sunday. 05/20/14   Srikar Janna Arch, MD  metoprolol tartrate (LOPRESSOR) 25 MG tablet Take 0.5 tablets (12.5 mg total) by mouth 2 (two) times daily. 01/13/15   Clovis Fredrickson, MD  mometasone-formoterol (DULERA) 100-5 MCG/ACT AERO Inhale 2 puffs into the lungs 2 (two) times daily. 01/13/15   Clovis Fredrickson, MD  Nutritional Supplements (FEEDING SUPPLEMENT, NEPRO CARB STEADY,) LIQD Take 237 mLs by mouth as needed (missed meal during dialysis.). Patient not taking: Reported on 10/13/2014 05/20/14   Bynum Bellows, MD  pantoprazole (PROTONIX) 40 MG tablet Take 1 tablet (40 mg total) by mouth daily. Patient not taking: Reported on 10/13/2014 08/01/14   Delfina Redwood, MD   BP 90/55 mmHg  Pulse 92  Temp(Src) 98.9 F (37.2 C) (Oral)  Resp 24  Ht 5\' 4"  (1.626 m)  Wt 179 lb 9.6 oz (81.466 kg)  BMI 30.81 kg/m2  SpO2 94%  LMP 05/09/2008 Physical Exam   Constitutional:  Patient is deconditioned and moderately obese. She has had waxing and waning levels of alertness. At the time of my examination she had become hyper alert and had slurred speech difficulty understand. She did not have acute respiratory distress. She is nontoxic.  HENT:  Head: Normocephalic and atraumatic.  Right Ear: External ear normal.  Left Ear: External ear normal.  Nose: Nose normal.  Mucous membranes slightly dry. Posterior oropharynx widely patent.  Eyes: EOM are normal. Pupils are equal, round, and reactive to light. No scleral icterus.  Neck: Neck supple.  Cardiovascular: Normal rate, regular rhythm and intact distal pulses.   2/6 systolic ejection murmur.  Pulmonary/Chest: Effort normal. No respiratory distress.  Patient is not cooperating for deep inspiration however she does appear to have some fine basilar rails.  She has good air flow bilaterally without any active wheeze or rhonchi.  Abdominal: Soft. Bowel sounds are normal. She exhibits no distension. There is no tenderness.  Musculoskeletal: Normal range of motion. She exhibits edema.  2+ to 3+ pitting edema bilateral lower legs and feet. Skin condition is good without active wounds or cellulitis.  Neurological: She is alert. She has normal strength. GCS eye subscore is 4. GCS verbal subscore is 5. GCS motor subscore is 6.  Patient was initially alert with me but had previously been somnolent and difficult to arouse. After my initial examination she had another period of somnolence. She appeared to undergo some seizure activity that was predominantly ocular staccato motion and facial trembling.. There was no tonic-clonic extremity movement. After this she had a postictal appearance with no discernible seizure-like movements but somnolent. During my first phase of evaluation the patient was trying to pull off her pulse oximetry monitor. She was stating that she wanted to go home. She pulled herself down to the end  of the bed using all 4 extremities with good strength. From that condition she went to this predominantly flaccid state with ocular motion which I believe to be seizure.  Skin: Skin is warm, dry and intact. No rash noted.    ED Course  Procedures (including critical care time) Labs Review Labs Reviewed  COMPREHENSIVE METABOLIC PANEL - Abnormal; Notable for the following:    Potassium 5.9 (*)    Chloride 112 (*)    CO2 14 (*)    BUN 80 (*)    Creatinine, Ser 5.90 (*)    Albumin 3.3 (*)    ALT 10 (*)    GFR calc non Af Amer 7 (*)    GFR calc Af Amer 8 (*)    All other components within normal limits  CBC - Abnormal; Notable for the following:    RBC 3.50 (*)    Hemoglobin 10.9 (*)    HCT 35.5 (*)    MCV 101.4 (*)    RDW 19.0 (*)    All other components within normal limits  URINALYSIS, ROUTINE W REFLEX MICROSCOPIC (NOT AT [facility]) - Abnormal; Notable for the following:    Protein, ur >300 (*)    All other components within normal limits  VALPROIC ACID LEVEL - Abnormal; Notable for the following:    Valproic Acid Lvl 49 (*)    All other components within normal limits  ACETAMINOPHEN LEVEL - Abnormal; Notable for the following:    Acetaminophen (Tylenol), Serum <10 (*)    All other components within normal limits  COMPREHENSIVE METABOLIC PANEL - Abnormal; Notable for the following:    Chloride 98 (*)    BUN 28 (*)    Creatinine, Ser 3.36 (*)    Albumin 3.2 (*)    ALT 9 (*)    GFR calc non Af Amer 14 (*)    GFR calc Af Amer 17 (*)    All other components within normal limits  CBC - Abnormal; Notable for the following:    RBC 3.49 (*)    Hemoglobin 11.1 (*)    HCT 35.0 (*)    MCV 100.3 (*)    RDW 18.8 (*)    All other components within normal limits  BLOOD GAS, ARTERIAL - Abnormal; Notable for the following:    pH, Arterial 7.261 (*)    pCO2 arterial 62.9 (*)    pO2, Arterial 54.9 (*)    Bicarbonate 27.3 (*)    All  other components within normal limits  PHOSPHORUS -  Abnormal; Notable for the following:    Phosphorus 8.3 (*)    All other components within normal limits  BLOOD GAS, ARTERIAL - Abnormal; Notable for the following:    pH, Arterial 7.348 (*)    pCO2 arterial 47.8 (*)    pO2, Arterial 121 (*)    Bicarbonate 25.5 (*)    All other components within normal limits  BASIC METABOLIC PANEL - Abnormal; Notable for the following:    Sodium 131 (*)    Chloride 94 (*)    Glucose, Bld 156 (*)    BUN 42 (*)    Creatinine, Ser 5.56 (*)    Calcium 8.7 (*)    GFR calc non Af Amer 8 (*)    GFR calc Af Amer 9 (*)    All other components within normal limits  CBC - Abnormal; Notable for the following:    RBC 3.41 (*)    Hemoglobin 10.9 (*)    HCT 34.0 (*)    RDW 18.2 (*)    All other components within normal limits  CBG MONITORING, ED - Abnormal; Notable for the following:    Glucose-Capillary 63 (*)    All other components within normal limits  I-STAT CHEM 8, ED - Abnormal; Notable for the following:    Potassium 5.9 (*)    Chloride 119 (*)    BUN 85 (*)    Creatinine, Ser 5.70 (*)    Calcium, Ion 1.08 (*)    All other components within normal limits  I-STAT ARTERIAL BLOOD GAS, ED - Abnormal; Notable for the following:    pH, Arterial 7.200 (*)    pO2, Arterial 76.0 (*)    Bicarbonate 16.5 (*)    Acid-base deficit 11.0 (*)    All other components within normal limits  MRSA PCR SCREENING  ETHANOL  URINE RAPID DRUG SCREEN, HOSP PERFORMED  AMMONIA  SALICYLATE LEVEL  TROPONIN I  URINE MICROSCOPIC-ADD ON  IRON AND TIBC  CBG MONITORING, ED  CBG MONITORING, ED    Imaging Review Dg Chest Port 1 View  01/28/2015   CLINICAL DATA:  Pulmonary edema.  End-stage renal disease.  EXAM: PORTABLE CHEST - 1 VIEW  COMPARISON:  01/27/2015  FINDINGS: Mild enlargement of the cardiopericardial silhouette, without edema. Vascular clips project over the right upper extremity.  Mild left basilar subsegmental atelectasis.  The lungs appear clear.  IMPRESSION:  1. Mild enlargement of the cardiopericardial silhouette, without edema. 2. Mild left basilar subsegmental atelectasis.   Electronically Signed   By: Van Clines M.D.   On: 01/28/2015 09:50   I have personally reviewed and evaluated these images and lab results as part of my medical decision-making.   EKG Interpretation   Date/Time:  Monday January 26 2015 15:44:41 EDT Ventricular Rate:  107 PR Interval:  180 QRS Duration: 91 QT Interval:  332 QTC Calculation: 443 R Axis:   20 Text Interpretation:  Sinus tachycardia Since last tracing rate faster  Confirmed by Eulis Foster  MD, ELLIOTT [phone]) on 01/26/2015 3:59:13 PM      CRITICAL CARE Performed by: Charlesetta Shanks   Total critical care time: 60  Critical care time was exclusive of separately billable procedures and treating other patients.  Critical care was necessary to treat or prevent imminent or life-threatening deterioration.  Critical care was time spent personally by me on the following activities: development of treatment plan with patient and/or surrogate as well as nursing, discussions  with consultants, evaluation of patient's response to treatment, examination of patient, obtaining history from patient or surrogate, ordering and performing treatments and interventions, ordering and review of laboratory studies, ordering and review of radiographic studies, pulse oximetry and re-evaluation of patient's condition. Recheck 1700. Patient sleeping but as I come in the room she wakes up and states to me that she wanted some water. She then went back to sleep again. Oxygenation and airway are stable. No tonic-clonic activity. Recheck 1730. Awakens to light stim. Still requests water and back to sleep. 02 Sat 93% RA. BP stable. SR 100.  Consult 17:45 discussed with Dr. Moshe Cipro of nephrology. She felt that the patient's acidosis was metabolic in origin and that we would not see any significant improvement by BiPAP if the  patient is not having acute vascular overload symptomology. She will arrange for dialysis and advises for admission to medical service until dialysis this arranged. MDM   Final diagnoses:  ESRD needing dialysis  Delirium  Metabolic acidosis   Patient presents without family members for additional history. History is obtained from medical record and EMS. Patient presents with history of significant mental illness with history of psychosis which in the past has impeded medical management of serious medical illness, esp. ESRD with noncompliance for dialysis. Patient has had waxing and waning mental status with periods suggestive of psychosis or delirium. Pertinent finding is acidosis without evident focus of infectious etiology. This has been reviewed with nephrology and determined that most likely cause is metabolic from renal failure. Patient has not had respiratory distress or hypoxia. The patient will be admitted to medical service with consultation having been placed to nephrology for emergent\urgent dialysis and medical management. Through her stay in emergency department the patient has not had respiratory\airway support.    Charlesetta Shanks, MD 01/30/15 [phone]

## 2015-01-26 NOTE — H&P (Signed)
[facility] History and Physical  Jennifer Lawson NKN:[id number] DOB: [birth year] DOA: 01/26/2015  Referring physician:  PCP: [facility]   Chief Complaint:   HPI: Jennifer Lawson is a 54 y.o. female with a PMH of bipolar disorder, seizures, end-stage renal disease on hemodialysis, hypertension, tobacco abuse disorder, COPD, type 2 diabetes who was brought to the emergency department due to being somnolent and confused at home. Patient has a history of seizures and an extensive psychiatric history as well. She is currently unable to provide history and no family members are present at this time. When seen the patient was somnolent, responsive to touch, opened eyes for moment a moment, but wouldn't answer simple questions.  Workup in the ER reveals hyperkalemia and metabolic acidosis.   Review of Systems:  Unable to review.  Past Medical History  Diagnosis Date  . Mental disorder   . Depression   . Hypertension   . Overdose   . Tobacco use disorder 11/13/2012  . Complication of anesthesia     difficulty going to sleep  . Chronic kidney disease     06/11/13- not on dialysis  . Shortness of breath     lying down flat  . PTSD (post-traumatic stress disorder)   . Asthma   . COPD (chronic obstructive pulmonary disease)   . Heart murmur   . GERD (gastroesophageal reflux disease)   . Seizures     "passsed out"  . History of blood transfusion   . Diabetes mellitus without complication     denies   Past Surgical History  Procedure Laterality Date  . Right knee replacement      she says it was last year.  . Esophagogastroduodenoscopy Left 11/14/2012    Procedure: ESOPHAGOGASTRODUODENOSCOPY (EGD);  Surgeon: [name], MD;  Location: [facility];  Service: Endoscopy;  Laterality: Left;  . Joint replacement Right     knee  . Parathyroidectomy    . Av fistula placement Right 06/12/2013    Procedure: ARTERIOVENOUS (AV) FISTULA CREATION; RIGHT  BASILIC VEIN  TRANSPOSITION with Intraoperative ultrasound;  Surgeon: Mal [name], MD;  Location: [facility];  Service: Vascular;  Laterality: Right;   Social History:  reports that she has been smoking Cigarettes and Cigars.  She has a 80 pack-year smoking history. She has never used smokeless tobacco. She reports that she does not drink alcohol or use illicit drugs.  Allergies  Allergen Reactions  . Codeine Sulfate Anaphylaxis    Daughter called about having this allergy   . Depakote [Divalproex Sodium] Other (See Comments)    Nose bleeds  . Gabapentin Other (See Comments)    seizures  . Haldol [Haloperidol Lactate] Shortness Of Breath  . Risperidone And Related Shortness Of Breath  . Trazodone And Nefazodone Other (See Comments)    Makes pt lose balance and fall  . Invega [Paliperidone Er] Nausea And Vomiting  . Vistaril [Hydroxyzine Hcl] Nausea And Vomiting    Family History  Problem Relation Age of Onset  . Diabetes Mother   . Hyperlipidemia Mother   . Hypertension Mother   . Diabetes Father   . Hypertension Father   . Hyperlipidemia Father     Prior to Admission medications   Medication Sig Start Date End Date Taking? Authorizing Provider  acetaminophen (TYLENOL) 325 MG tablet Take 2 tablets (650 mg total) by mouth every 4 (four) hours as needed for fever, headache or mild pain. 05/20/14   Bynum Bellows, MD  albuterol (PROVENTIL  HFA;VENTOLIN HFA) 108 (90 BASE) MCG/ACT inhaler Inhale 2 puffs into the lungs every 6 (six) hours as needed for wheezing or shortness of breath. 01/13/15   Clovis Fredrickson, MD  bisacodyl (DULCOLAX) 5 MG EC tablet Take 1 tablet (5 mg total) by mouth daily as needed for moderate constipation. Patient not taking: Reported on 10/13/2014 05/20/14   Bynum Bellows, MD  cinacalcet (SENSIPAR) 60 MG tablet Take 60 mg by mouth daily.    Historical Provider, MD  Darbepoetin Alfa (ARANESP) 60 MCG/0.3ML SOSY injection Inject 0.3 mLs (60 mcg total) into the vein every Thursday  with hemodialysis. 05/20/14   Bynum Bellows, MD  divalproex (DEPAKOTE ER) 500 MG 24 hr tablet Take 1-2 tablets (500-1,000 mg total) by mouth 2 (two) times daily. Take 500 mg in the morning and 1000 mg in the evening. 01/13/15   Clovis Fredrickson, MD  docusate sodium 100 MG CAPS Take 100 mg by mouth 2 (two) times daily. Patient not taking: Reported on 10/13/2014 05/20/14   Bynum Bellows, MD  doxepin (SINEQUAN) 25 MG capsule Take 25 mg by mouth at bedtime. 09/12/14   Historical Provider, MD  ferric gluconate 250 mg in sodium chloride 0.9 % 100 mL Inject 250 mg into the vein every Tuesday, Thursday, Saturday, and Sunday. 05/20/14   Srikar Janna Arch, MD  ibuprofen (ADVIL,MOTRIN) 200 MG tablet Take 600 mg by mouth every 6 (six) hours as needed for mild pain, moderate pain or cramping.    Historical Provider, MD  LORazepam (ATIVAN) 0.5 MG tablet Take 0.5 mg by mouth daily as needed for anxiety.    Historical Provider, MD  metoprolol tartrate (LOPRESSOR) 25 MG tablet Take 0.5 tablets (12.5 mg total) by mouth 2 (two) times daily. For HTN Patient taking differently: Take 25 mg by mouth 2 (two) times daily. For HTN 05/20/14   Bynum Bellows, MD  metoprolol tartrate (LOPRESSOR) 25 MG tablet Take 0.5 tablets (12.5 mg total) by mouth 2 (two) times daily. 01/13/15   Clovis Fredrickson, MD  mometasone-formoterol (DULERA) 100-5 MCG/ACT AERO Inhale 2 puffs into the lungs 2 (two) times daily. 01/13/15   Clovis Fredrickson, MD  Nutritional Supplements (FEEDING SUPPLEMENT, NEPRO CARB STEADY,) LIQD Take 237 mLs by mouth as needed (missed meal during dialysis.). Patient not taking: Reported on 10/13/2014 05/20/14   Bynum Bellows, MD  pantoprazole (PROTONIX) 40 MG tablet Take 1 tablet (40 mg total) by mouth daily. Patient not taking: Reported on 10/13/2014 08/01/14   Delfina Redwood, MD  ziprasidone (GEODON) 60 MG capsule Take 1 capsule (60 mg total) by mouth 2 (two) times daily with a meal. For mood control 01/13/15   Clovis Fredrickson, MD   Physical Exam: Filed Vitals:   01/26/15 1547 01/26/15 1600 01/26/15 1627 01/26/15 1700  BP: 136/79 136/77 148/80 157/83  Pulse: 108 107 106 110  Temp: 98.8 F (37.1 C)     TempSrc:      Resp: 18 16 21 21   Height: 5\' 5"  (1.651 m)     Weight: 77.111 kg (170 lb)     SpO2: 95% 91% 94% 96%    Wt Readings from Last 3 Encounters:  01/26/15 77.111 kg (170 lb)  01/13/15 89.7 kg (197 lb 12 oz)  07/31/14 85.412 kg (188 lb 4.8 oz)    General:  Appears calm and comfortable Eyes: PERRL, normal lids, irises & conjunctiva ENT: grossly normal hearing, lips & tongue are dry. Neck: no LAD, masses  or thyromegaly Cardiovascular: RRR, positive 4/6 systolic murmur. No LE edema. Telemetry: Tachycardic at 108 bpm, no arrhythmias  Respiratory: CTA bilaterally, no w/r/r. Normal respiratory effort. Abdomen: soft, ntnd Skin: no rash or induration seen on limited exam Musculoskeletal: Decreased muscle tone. Psychiatric: Somnolent. Neurologic: Somnolent, responds to touch, but unable to fully evaluate           Labs on Admission:  Basic Metabolic Panel:  Recent Labs Lab 01/26/15 1631 01/26/15 1637  NA 141 141  K 5.9* 5.9*  CL 119* 112*  CO2  --  14*  GLUCOSE 70 72  BUN 85* 80*  CREATININE 5.70* 5.90*  CALCIUM  --  9.1   Liver Function Tests:  Recent Labs Lab 01/26/15 1637  AST 17  ALT 10*  ALKPHOS 117  BILITOT 0.5  PROT 7.4  ALBUMIN 3.3*    Recent Labs Lab 01/26/15 1637  AMMONIA 29   CBC:  Recent Labs Lab 01/26/15 1631 01/26/15 1637  WBC  --  6.0  HGB 12.9 10.9*  HCT 38.0 35.5*  MCV  --  101.4*  PLT  --  199   Cardiac Enzymes:  Recent Labs Lab 01/26/15 1637  TROPONINI 0.03    BNP (last 3 results)  Recent Labs  10/13/14 1603  BNP 659.1*    CBG:  Recent Labs Lab 01/26/15 1627 01/26/15 1749 01/26/15 1902  GLUCAP 63* 80 75    Radiological Exams on Admission: Dg Chest Port 1 View  01/26/2015   CLINICAL DATA:  Renal failure  air and missed dialysis treatment with altered mental status  EXAM: PORTABLE CHEST - 1 VIEW  COMPARISON:  10/13/2014  FINDINGS: Cardiac shadow is enlarged. Some mild prominence of the central pulmonary vascularity is noted likely related to fluid overload from the missed dialysis session. Patient is rotated to the right accentuating the mediastinal markings. Mild bibasilar atelectatic changes are seen.  IMPRESSION: Mild central vascular congestion likely related to volume overload.  Mild bibasilar atelectatic changes.   Electronically Signed   By: Inez Catalina M.D.   On: 01/26/2015 16:37    EKG: Independently reviewed. Vent. rate 107 BPM PR interval 180 ms QRS duration 91 ms QT/QTc 332/443 ms P-R-T axes 53 20 48 Sinus tachycardia  Assessment/Plan Principal Problem:   Change in mental status Likely due to medications, seizures or metabolic acidosis. Patient is somnolent, but currently is stable. She will be dialyzed as per nephrology orders. The patient had a negative CT scan of the head about 3 weeks ago, we will proceed with dialysis and repeat CT scan of the head if no improvement. Frequent neuro checks.  Active Problems:     Metabolic acidosis Continue with hemodialysis orders as per nephrology. Follow-up potassium levels and ABG.       Hyperkalemia Continue with hemodialysis orders as per nephrology.     ESRD (end stage renal disease) Continue with hemodialysis orders as per nephrology.     Anemia of renal disease Epogen as needed and monitor H&H.       HTN (hypertension) Continue current antihypertensive therapy and monitor blood pressure closely.    Tobacco use disorder Will offer nicotine patch once the patient is awake.       MDD (major depressive disorder), recurrent, severe, with psychosis Consider psych evaluation once we get further history.        Code Status: Full code. DVT Prophylaxis: Lovenox subcutaneous. Family Communication:  Disposition Plan:  Admit to the stepdown and hemodialysis as per nephrology.  Time spent: Over 70 minutes were spent during this admission.  Reubin Milan [facility] Pager [phone]

## 2015-01-26 NOTE — ED Notes (Addendum)
Pt presents to ED via EMS, EMS reported last seen normal last night 9p. Dialysis daily, but not treated since Friday;Polypharmacy with new med orders pt has NOT been compliant since Friday. Presents with slurred speech, altered mental status, right sided facial droop, and complaining of a headache.

## 2015-01-26 NOTE — Progress Notes (Signed)
According  To patients daughter," patient  was admitted here to [facility] about a week ago and was transferred  to [facility] for mental health treatment/dmission. During that hospitalization, [place] regional MD called Patient daughter asking about an allergy to depakote. At that time daughter told the Doctor there was  no known allergy to depakote at that time. Patient  Stayed at that location for a couple days and was discharged to home with daughter. Patient was prescribed depakote on discharge. Daughter says patient has been having many  complications like periodic swelling togue, dropping of one  Sided of face" and just stroke like symptoms" Daughter is also concerned about patient's current HD center. Daughter  reporting that the Dialysis center refused to dialyze the patient on Saturday due to staffing issues. Daughter was able to give admission assessment information to Probation officer. All meds sent home with daughter to include 9 pill bottles. Daughter was encouraged to speak to social worker and Case management in the morning. Daughter also asked to speak with MD in the morning.

## 2015-01-27 ENCOUNTER — Inpatient Hospital Stay (HOSPITAL_COMMUNITY): Payer: Medicare Other

## 2015-01-27 ENCOUNTER — Encounter (HOSPITAL_COMMUNITY): Payer: Self-pay

## 2015-01-27 DIAGNOSIS — D631 Anemia in chronic kidney disease: Secondary | ICD-10-CM

## 2015-01-27 DIAGNOSIS — E875 Hyperkalemia: Secondary | ICD-10-CM

## 2015-01-27 DIAGNOSIS — N186 End stage renal disease: Secondary | ICD-10-CM

## 2015-01-27 DIAGNOSIS — Z720 Tobacco use: Secondary | ICD-10-CM

## 2015-01-27 DIAGNOSIS — R4182 Altered mental status, unspecified: Secondary | ICD-10-CM

## 2015-01-27 DIAGNOSIS — I10 Essential (primary) hypertension: Secondary | ICD-10-CM

## 2015-01-27 LAB — COMPREHENSIVE METABOLIC PANEL
ALT: 9 U/L — ABNORMAL LOW (ref 14–54)
AST: 15 U/L (ref 15–41)
Albumin: 3.2 g/dL — ABNORMAL LOW (ref 3.5–5.0)
Alkaline Phosphatase: 111 U/L (ref 38–126)
Anion gap: 12 (ref 5–15)
BUN: 28 mg/dL — ABNORMAL HIGH (ref 6–20)
CO2: 26 mmol/L (ref 22–32)
Calcium: 9.9 mg/dL (ref 8.9–10.3)
Chloride: 98 mmol/L — ABNORMAL LOW (ref 101–111)
Creatinine, Ser: 3.36 mg/dL — ABNORMAL HIGH (ref 0.44–1.00)
GFR calc Af Amer: 17 mL/min — ABNORMAL LOW (ref >60)
GFR calc non Af Amer: 14 mL/min — ABNORMAL LOW (ref >60)
Glucose, Bld: 84 mg/dL (ref 65–99)
Potassium: 3.5 mmol/L (ref 3.5–5.1)
Sodium: 136 mmol/L (ref 135–145)
Total Bilirubin: 0.6 mg/dL (ref 0.3–1.2)
Total Protein: 7 g/dL (ref 6.5–8.1)

## 2015-01-27 LAB — BLOOD GAS, ARTERIAL
Acid-Base Excess: 1 mmol/L (ref 0.0–2.0)
Bicarbonate: 27.3 mEq/L — ABNORMAL HIGH (ref 20.0–24.0)
Drawn by: 281201
FIO2: 0.55
O2 Saturation: 84.7 %
Patient temperature: 98.7
TCO2: 29.3 mmol/L (ref 0–100)
pCO2 arterial: 62.9 mmHg (ref 35.0–45.0)
pH, Arterial: 7.261 — ABNORMAL LOW (ref 7.350–7.450)
pO2, Arterial: 54.9 mmHg — ABNORMAL LOW (ref 80.0–100.0)

## 2015-01-27 LAB — IRON AND TIBC
Iron: 76 ug/dL (ref 28–170)
Saturation Ratios: 28 % (ref 10.4–31.8)
TIBC: 274 ug/dL (ref 250–450)
UIBC: 198 ug/dL

## 2015-01-27 LAB — MRSA PCR SCREENING: MRSA by PCR: NEGATIVE

## 2015-01-27 LAB — CBC
HCT: 35 % — ABNORMAL LOW (ref 36.0–46.0)
Hemoglobin: 11.1 g/dL — ABNORMAL LOW (ref 12.0–15.0)
MCH: 31.8 pg (ref 26.0–34.0)
MCHC: 31.7 g/dL (ref 30.0–36.0)
MCV: 100.3 fL — ABNORMAL HIGH (ref 78.0–100.0)
Platelets: UNDETERMINED 10*3/uL (ref 150–400)
RBC: 3.49 MIL/uL — ABNORMAL LOW (ref 3.87–5.11)
RDW: 18.8 % — ABNORMAL HIGH (ref 11.5–15.5)
WBC: 6.3 10*3/uL (ref 4.0–10.5)

## 2015-01-27 LAB — PHOSPHORUS: Phosphorus: 8.3 mg/dL — ABNORMAL HIGH (ref 2.5–4.6)

## 2015-01-27 MED ORDER — ZIPRASIDONE MESYLATE 20 MG IM SOLR
20.0000 mg | Freq: Once | INTRAMUSCULAR | Status: AC
  Administered 2015-01-27: 20 mg via INTRAMUSCULAR
  Filled 2015-01-27: qty 20

## 2015-01-27 MED ORDER — PROMETHAZINE HCL 25 MG/ML IJ SOLN
12.5000 mg | Freq: Four times a day (QID) | INTRAMUSCULAR | Status: DC | PRN
  Administered 2015-01-27: 12.5 mg via INTRAVENOUS
  Filled 2015-01-27: qty 1

## 2015-01-27 MED ORDER — METOPROLOL TARTRATE 25 MG PO TABS
25.0000 mg | ORAL_TABLET | Freq: Two times a day (BID) | ORAL | Status: DC
  Administered 2015-01-28 – 2015-01-29 (×3): 25 mg via ORAL
  Filled 2015-01-27 (×3): qty 1

## 2015-01-27 NOTE — Progress Notes (Signed)
When RN arrived on unit this am, pt was found in room vomiting. Night Shift RN noticed pt was desating with O2 levels in the 70's. Pt put on venti mask at 55%. Pt saturations improved. Pt was alert and responsive. Pt was given zofran IV to help with the nausea. MD was contacted and came to bedside. ABG's were ordered as well as a chest xray. MD ordered pt be put on bipap. Pt was unable to tolerate bipap and was transferred to Nasal Cannula. Pt is on 5L O2 and saturations are in 90's. Pt is resting and calm at the moment.

## 2015-01-27 NOTE — Progress Notes (Signed)
[facility] PROGRESS NOTE   Jennifer Lawson JJO:[id number] DOB: 1961/04/23 DOA: 01/26/2015 [facility]CP: [facility]  HPI/Subjective: Patient vomited twice earlier today, we will check abdominal x-ray. At lethargic and PCO2 increases, patient will go for dialysis again today.  I have asked the nurse personally to keep O2 sat between 89 and 92 percent.  Assessment/Plan: Principal Problem:   Change in mental status Active Problems:   HTN (hypertension)   Tobacco use disorder   Hyperkalemia   MDD (major depressive disorder), recurrent, severe, with psychosis   ESRD (end stage renal disease)   Anemia of renal disease    Acute hypercapnic respiratory failure ABG showed pH of 7.2, PCO2 of 62.9, previous PCO2 was 42, avoid excessive oxygen supplementation. Placed on BiPAP, patient got more awake and she refused. She is vomiting so we'll hold on BiPAP.  Lethargy Likely due to medications, seizures and/or metabolic acidosis. This is got worse with CO2 narcosis from acute hypercapnic respiratory failure. Patient is somnolent, but currently is stable. She will be dialyzed as per nephrology orders. The patient had a negative CT scan of the head about 3 weeks ago, we will proceed with dialysis and repeat CT scan of the head if no improvement. Frequent neuro checks.   Metabolic acidosis Continue with hemodialysis orders as per nephrology. Follow-up potassium levels and ABG.   Hyperkalemia Continue with hemodialysis orders as per nephrology.   ESRD (end stage renal disease) Continue with hemodialysis orders as per nephrology. Noncompliant with dialysis sessions secondary to her psychiatric problems.   Anemia of renal disease Epogen as needed and monitor H&H.     HTN (hypertension) Continue current antihypertensive therapy and monitor blood pressure closely.   Tobacco use disorder Will offer nicotine patch once the patient is awake.     MDD (major  depressive disorder), recurrent, severe, with psychosis Consider psych evaluation once we get further history.    Code Status: Full Code Family Communication: Plan discussed with the patient. Disposition Plan: Remains inpatient Diet: Diet renal/carb modified with fluid restriction Diet-HS Snack?: Nothing; Room service appropriate?: Yes; Fluid consistency:: Thin  Consultants:  Nephrology  Procedures:  None  Antibiotics:     Objective: Filed Vitals:   01/27/15 1050  BP: 131/85  Pulse: 111  Temp:   Resp: 10    Intake/Output Summary (Last 24 hours) at 01/27/15 1206 Last data filed at 01/27/15 0155  Gross per 24 hour  Intake      0 ml  Output   3046 ml  Net  -3046 ml   Filed Weights   01/26/15 2021 01/26/15 2145 01/27/15 0155  Weight: 92 kg (202 lb 13.2 oz) 93.9 kg (207 lb 0.2 oz) 89.8 kg (197 lb 15.6 oz)    Exam: General: Alert and awake, oriented x3, not in any acute distress. HEENT: anicteric sclera, pupils reactive to light and accommodation, EOMI CVS: S1-S2 clear, no murmur rubs or gallops Chest: clear to auscultation bilaterally, no wheezing, rales or rhonchi Abdomen: soft nontender, nondistended, normal bowel sounds, no organomegaly Extremities: no cyanosis, clubbing or edema noted bilaterally Neuro: Cranial nerves II-XII intact, no focal neurological deficits  Data Reviewed: Basic Metabolic Panel:  Recent Labs Lab 01/26/15 1631 01/26/15 1637 01/27/15 0700  NA 141 141 136  K 5.9* 5.9* 3.5  CL 119* 112* 98*  CO2  --  14* 26  GLUCOSE 70 72 84  BUN 85* 80* 28*  CREATININE 5.70* 5.90* 3.36*  CALCIUM  --  9.1 9.9   Liver  Function Tests:  Recent Labs Lab 01/26/15 1637 01/27/15 0700  AST 17 15  ALT 10* 9*  ALKPHOS 117 111  BILITOT 0.5 0.6  PROT 7.4 7.0  ALBUMIN 3.3* 3.2*   No results for input(s): LIPASE, AMYLASE in the last 168 hours.  Recent Labs Lab 01/26/15 1637  AMMONIA 29   CBC:  Recent Labs Lab 01/26/15 1631  01/26/15 1637 01/27/15 0700  WBC  --  6.0 6.3  HGB 12.9 10.9* 11.1*  HCT 38.0 35.5* 35.0*  MCV  --  101.4* 100.3*  PLT  --  199 PLATELET CLUMPS NOTED ON SMEAR, UNABLE TO ESTIMATE   Cardiac Enzymes:  Recent Labs Lab 01/26/15 1637  TROPONINI 0.03   BNP (last 3 results)  Recent Labs  10/13/14 1603  BNP 659.1*    ProBNP (last 3 results) No results for input(s): PROBNP in the last 8760 hours.  CBG:  Recent Labs Lab 01/26/15 1627 01/26/15 1749 01/26/15 1902  GLUCAP 63* 80 75    Micro Recent Results (from the past 240 hour(s))  MRSA PCR Screening     Status: None   Collection Time: 01/26/15 11:28 PM  Result Value Ref Range Status   MRSA by PCR NEGATIVE NEGATIVE Final    Comment:        The GeneXpert MRSA Assay (FDA approved for NASAL specimens only), is one component of a comprehensive MRSA colonization surveillance program. It is not intended to diagnose MRSA infection nor to guide or monitor treatment for MRSA infections.      Studies: Ct Head Wo Contrast  01/27/2015   CLINICAL DATA:  Altered mental status, lethargy  EXAM: CT HEAD WITHOUT CONTRAST  TECHNIQUE: Contiguous axial images were obtained from the base of the skull through the vertex without intravenous contrast.  COMPARISON:  01/03/2015  FINDINGS: No evidence of parenchymal hemorrhage or extra-axial fluid collection. No mass lesion, mass effect, or midline shift.  No CT evidence of acute infarction.  Subcortical white matter and periventricular small vessel ischemic changes. Intracranial atherosclerosis.  Cerebral volume is within normal limits.  No ventriculomegaly.  The visualized paranasal sinuses are essentially clear, noting minimal fluid in the left maxillary sinus. The mastoid air cells are unopacified.  No evidence of calvarial fracture.  IMPRESSION: No evidence of acute intracranial abnormality.  Mild small vessel ischemic changes.   Electronically Signed   By: Julian Hy M.D.   On:  01/27/2015 03:56   Dg Chest Port 1 View  01/27/2015   CLINICAL DATA:  Respiratory failure.  End-stage renal disease.  EXAM: PORTABLE CHEST - 1 VIEW  COMPARISON:  01/26/2015  FINDINGS: Cardiomegaly. Low lung volumes with elevation of the left hemidiaphragm and bibasilar atelectasis. Mild vascular congestion. No effusions. No acute bony abnormality.  IMPRESSION: Cardiomegaly.  Low volumes with bibasilar atelectasis.   Electronically Signed   By: Rolm Baptise M.D.   On: 01/27/2015 11:45   Dg Chest Port 1 View  01/26/2015   CLINICAL DATA:  Renal failure air and missed dialysis treatment with altered mental status  EXAM: PORTABLE CHEST - 1 VIEW  COMPARISON:  10/13/2014  FINDINGS: Cardiac shadow is enlarged. Some mild prominence of the central pulmonary vascularity is noted likely related to fluid overload from the missed dialysis session. Patient is rotated to the right accentuating the mediastinal markings. Mild bibasilar atelectatic changes are seen.  IMPRESSION: Mild central vascular congestion likely related to volume overload.  Mild bibasilar atelectatic changes.   Electronically Signed   By: Elta Guadeloupe  Lukens M.D.   On: 01/26/2015 16:37    Scheduled Meds: . cinacalcet  60 mg Oral Q breakfast  . divalproex  500 mg Oral BID  . docusate sodium  100 mg Oral BID  . doxepin  25 mg Oral QHS  . enoxaparin (LOVENOX) injection  30 mg Subcutaneous Q24H  . LORazepam  1 mg Intravenous Once  . mometasone-formoterol  2 puff Inhalation BID  . pantoprazole  40 mg Oral Daily  . sodium chloride  3 mL Intravenous Q12H  . sodium chloride  3 mL Intravenous Q12H  . ziprasidone  60 mg Oral BID WC   Continuous Infusions:      Time spent: 35 minutes    [facility]  [facility] Pager [phone] If 7PM-7AM, please contact night-coverage at [email or web address], password [facility] 01/27/2015, 12:06 PM  LOS: 1 day

## 2015-01-27 NOTE — Progress Notes (Signed)
[facility] Progress Note   Subjective: Had 3.5 kg removed w HD last night. ABG done this am showed pCO2 in 70's so bipap was started. She is responsive now w no complaints.   Filed Vitals:   01/27/15 0115 01/27/15 0130 01/27/15 0155 01/27/15 0200  BP: 89/59 99/66 100/60 106/70  Pulse: 113 113 115 115  Temp:   98.7 F (37.1 C)   TempSrc:   Oral   Resp:   16   Height:      Weight:   89.8 kg (197 lb 15.6 oz)   SpO2:  97% 96%    Exam: On bipap, responds appropriately, "you're the kidney doctor!" +JVD Rales L base, R clear RRR tachy no MRG Abd soft, obese ntnd no ascites 1-2+ bilat LE edema Neuro follows commands  Dialyzes at [place] 2/2, Dialyzer 180, Heparin none. Access AVF. mircera 150 given 8/6- none given since at [place]. Sensipar 60 daily- no binders on list  Assessment: 1 Altered MS - unknown etiology- possibilities include psych/meds- possibly some metabolic derangement due to uremia- work up per primary 2 Pulm edema - still hypoxemic this am despite 3.5kg off last night; still over dry wt by 7kg 3 Acute resp failure - hypercarbic, unclear cause 4 ESRD: normal schedule is TTS at [place] but has not attended regularly 5 Hypertension: BP's soft after HD , up now. 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 Check labs and follow 8 Psych- seems to be major component here- on geodon/doxepin/ativan/depakote - per primary    Plan - HD again now, max UF , get vol down, hold lopressor    Kelly Splinter MD  pager [phone]    cell [phone]  01/27/2015, 9:51 AM     Recent Labs Lab 01/26/15 1631 01/26/15 1637 01/27/15 0700  NA 141 141 136  K 5.9* 5.9* 3.5  CL 119* 112* 98*  CO2  --  14* 26  GLUCOSE 70 72 84  BUN 85* 80* 28*  CREATININE 5.70* 5.90*  3.36*  CALCIUM  --  9.1 9.9    Recent Labs Lab 01/26/15 1637 01/27/15 0700  AST 17 15  ALT 10* 9*  ALKPHOS 117 111  BILITOT 0.5 0.6  PROT 7.4 7.0  ALBUMIN 3.3* 3.2*    Recent Labs Lab 01/26/15 1631 01/26/15 1637 01/27/15 0700  WBC  --  6.0 6.3  HGB 12.9 10.9* 11.1*  HCT 38.0 35.5* 35.0*  MCV  --  101.4* 100.3*  PLT  --  199 PLATELET CLUMPS NOTED ON SMEAR, UNABLE TO ESTIMATE   . cinacalcet  60 mg Oral Q breakfast  . divalproex  500 mg Oral BID  . docusate sodium  100 mg Oral BID  . doxepin  25 mg Oral QHS  . enoxaparin (LOVENOX) injection  30 mg Subcutaneous Q24H  . ferric gluconate (FERRLECIT/NULECIT) IV  250 mg Intravenous Q T,Th,S,Su  . LORazepam  1 mg Intravenous Once  . metoprolol tartrate  12.5 mg Oral BID  . mometasone-formoterol  2 puff Inhalation BID  . pantoprazole  40 mg Oral Daily  . sodium chloride  3 mL Intravenous Q12H  . sodium chloride  3 mL Intravenous Q12H  . ziprasidone  60 mg Oral BID WC  sodium chloride, acetaminophen, bisacodyl, feeding supplement (NEPRO CARB STEADY), LORazepam, ondansetron **OR** ondansetron (ZOFRAN) IV, sodium chloride

## 2015-01-27 NOTE — Progress Notes (Signed)
Hemodialysis Tx done. Had some drops in BP during Tx. No improvement in mental status post Tx.

## 2015-01-27 NOTE — Progress Notes (Signed)
RT entered room as pt was removing Bipap mask. The pt refused to let RT put her back on Bipap, refused venturi mask, but accepted a nasal cannula. Set at 5 Lpm. Sat 97%.  Pt has refused an ABG to check her oxygenation.  RN notified MD.

## 2015-01-27 NOTE — Progress Notes (Signed)
Utilization Review Completed.  

## 2015-01-27 NOTE — Progress Notes (Signed)
Patient is currently o 2.5LNC and is in no distress st this time. Sats are 97%. BIPAP is not needed at this time.

## 2015-01-28 ENCOUNTER — Inpatient Hospital Stay (HOSPITAL_COMMUNITY): Payer: Medicare Other

## 2015-01-28 DIAGNOSIS — F333 Major depressive disorder, recurrent, severe with psychotic symptoms: Secondary | ICD-10-CM

## 2015-01-28 LAB — BLOOD GAS, ARTERIAL
Acid-Base Excess: 0.6 mmol/L (ref 0.0–2.0)
Bicarbonate: 25.5 mEq/L — ABNORMAL HIGH (ref 20.0–24.0)
Drawn by: 277551
Expiratory PAP: 6
FIO2: 0.25
Inspiratory PAP: 18
O2 Saturation: 98.1 %
Patient temperature: 98.6
TCO2: 27 mmol/L (ref 0–100)
pCO2 arterial: 47.8 mmHg — ABNORMAL HIGH (ref 35.0–45.0)
pH, Arterial: 7.348 — ABNORMAL LOW (ref 7.350–7.450)
pO2, Arterial: 121 mmHg — ABNORMAL HIGH (ref 80.0–100.0)

## 2015-01-28 LAB — BASIC METABOLIC PANEL
Anion gap: 11 (ref 5–15)
BUN: 42 mg/dL — ABNORMAL HIGH (ref 6–20)
CO2: 26 mmol/L (ref 22–32)
Calcium: 8.7 mg/dL — ABNORMAL LOW (ref 8.9–10.3)
Chloride: 94 mmol/L — ABNORMAL LOW (ref 101–111)
Creatinine, Ser: 5.56 mg/dL — ABNORMAL HIGH (ref 0.44–1.00)
GFR calc Af Amer: 9 mL/min — ABNORMAL LOW (ref >60)
GFR calc non Af Amer: 8 mL/min — ABNORMAL LOW (ref >60)
Glucose, Bld: 156 mg/dL — ABNORMAL HIGH (ref 65–99)
Potassium: 3.8 mmol/L (ref 3.5–5.1)
Sodium: 131 mmol/L — ABNORMAL LOW (ref 135–145)

## 2015-01-28 LAB — CBC
HCT: 34 % — ABNORMAL LOW (ref 36.0–46.0)
Hemoglobin: 10.9 g/dL — ABNORMAL LOW (ref 12.0–15.0)
MCH: 32 pg (ref 26.0–34.0)
MCHC: 32.1 g/dL (ref 30.0–36.0)
MCV: 99.7 fL (ref 78.0–100.0)
Platelets: 183 10*3/uL (ref 150–400)
RBC: 3.41 MIL/uL — ABNORMAL LOW (ref 3.87–5.11)
RDW: 18.2 % — ABNORMAL HIGH (ref 11.5–15.5)
WBC: 5.7 10*3/uL (ref 4.0–10.5)

## 2015-01-28 MED ORDER — LIDOCAINE-PRILOCAINE 2.5-2.5 % EX CREA
1.0000 "application " | TOPICAL_CREAM | CUTANEOUS | Status: DC | PRN
  Filled 2015-01-28: qty 5

## 2015-01-28 MED ORDER — LIDOCAINE-PRILOCAINE 2.5-2.5 % EX CREA: 1.0000 "application " | TOPICAL_CREAM | CUTANEOUS | Status: DC | PRN

## 2015-01-28 MED ORDER — SODIUM CHLORIDE 0.9 % IV SOLN
100.0000 mL | INTRAVENOUS | Status: DC | PRN
Start: 2015-01-28 — End: 2015-01-29

## 2015-01-28 MED ORDER — SODIUM CHLORIDE 0.9 % IV SOLN: 100.0000 mL | INTRAVENOUS | Status: DC | PRN

## 2015-01-28 MED ORDER — LIDOCAINE HCL (PF) 1 % IJ SOLN: 5.0000 mL | INTRAMUSCULAR | Status: DC | PRN

## 2015-01-28 MED ORDER — INFLUENZA VAC SPLIT QUAD 0.5 ML IM SUSY
0.5000 mL | PREFILLED_SYRINGE | INTRAMUSCULAR | Status: AC
  Administered 2015-01-29: 0.5 mL via INTRAMUSCULAR
  Filled 2015-01-28: qty 0.5

## 2015-01-28 MED ORDER — HEPARIN SODIUM (PORCINE) 1000 UNIT/ML DIALYSIS: 1000.0000 [IU] | INTRAMUSCULAR | Status: DC | PRN

## 2015-01-28 MED ORDER — ALTEPLASE 2 MG IJ SOLR
2.0000 mg | Freq: Once | INTRAMUSCULAR | Status: DC | PRN
  Filled 2015-01-28: qty 2

## 2015-01-28 MED ORDER — PENTAFLUOROPROP-TETRAFLUOROETH EX AERO: 1.0000 "application " | INHALATION_SPRAY | CUTANEOUS | Status: DC | PRN

## 2015-01-28 MED ORDER — NEPRO/CARBSTEADY PO LIQD
237.0000 mL | Freq: Two times a day (BID) | ORAL | Status: DC
  Administered 2015-01-29: 237 mL via ORAL

## 2015-01-28 MED ORDER — SODIUM CHLORIDE 0.9 % IV SOLN
250.0000 mg | INTRAVENOUS | Status: DC
  Administered 2015-01-29: 250 mg via INTRAVENOUS
  Filled 2015-01-28 (×2): qty 20

## 2015-01-28 MED ORDER — PIPERACILLIN-TAZOBACTAM 3.375 G IVPB
3.3750 g | Freq: Three times a day (TID) | INTRAVENOUS | Status: DC
  Administered 2015-01-28 – 2015-01-29 (×4): 3.375 g via INTRAVENOUS
  Filled 2015-01-28 (×6): qty 50

## 2015-01-28 MED ORDER — ALTEPLASE 2 MG IJ SOLR: 2.0000 mg | Freq: Once | INTRAMUSCULAR | Status: DC | PRN

## 2015-01-28 MED ORDER — PNEUMOCOCCAL VAC POLYVALENT 25 MCG/0.5ML IJ INJ
0.5000 mL | INJECTION | INTRAMUSCULAR | Status: AC
  Administered 2015-01-29: 0.5 mL via INTRAMUSCULAR
  Filled 2015-01-28: qty 0.5

## 2015-01-28 MED ORDER — NICOTINE 21 MG/24HR TD PT24
21.0000 mg | MEDICATED_PATCH | Freq: Every day | TRANSDERMAL | Status: DC
  Administered 2015-01-28 – 2015-01-29 (×2): 21 mg via TRANSDERMAL
  Filled 2015-01-28 (×2): qty 1

## 2015-01-28 NOTE — Progress Notes (Signed)
IV Zosyn new order received this am. Patients NSL was not working, IV team consulted to restart IV. Will continue to monitor patient.C.Foecking,RN

## 2015-01-28 NOTE — Progress Notes (Signed)
RN called to room by patient, patient stated she needed to leave and get a cigarette, explained to patient she could not leave to get a cigarette but I could call MD for a order for nicotine patch,patient became agitated and requested to speak to her daughter. RN called patients daughter and daughter also explained she could not have a cigarette. Dr. Hartford Poli paged and order received for nicotine patch, and MD to call patients daughter. Will continue to monitor patient. C.Foecking,RN

## 2015-01-28 NOTE — Progress Notes (Addendum)
Pt is awake and can follow commands.  Placed pt on RA- Pt asking for water.  RN aware

## 2015-01-28 NOTE — Progress Notes (Signed)
Patient more sleepy and lethargic than this AM, unable to stay awake to obey simple commands. Resp. Therapist placed pt back on BIPAP. Dr. Hartford Poli notified, order for ABG obtained. Will continue to monitor patient.C.Jermiya Reichl,RN

## 2015-01-28 NOTE — Progress Notes (Signed)
Initial Nutrition Assessment  DOCUMENTATION CODES:   Obesity unspecified  INTERVENTION:   Nepro Shake po BID, each supplement provides 425 kcal and 19 grams protein  NUTRITION DIAGNOSIS:   Increased nutrient needs related to chronic illness as evidenced by estimated needs  GOAL:   Patient will meet greater than or equal to 90% of their needs  MONITOR:   PO intake, Supplement acceptance, Labs, Weight trends, I & O's  REASON FOR ASSESSMENT:   Low Braden  ASSESSMENT:   54 y.o. Female with a PMH of bipolar disorder, seizures, end-stage renal disease on hemodialysis, hypertension, tobacco abuse disorder, COPD, type 2 diabetes who was brought to the emergency department due to being somnolent and confused at home.  RD unable to obtain nutrition hx at this time.  Pt on BiPAP.  Nutrient needs increased given ESRD on HD.  Nepro Carb Steady ordered PRN.  Will change supplement frequency to BID.  Low braden score places patient at risk for skin breakdown.  RD unable to complete Nutrition Focused Physical Exam at this time.  Diet Order:  Diet renal/carb modified with fluid restriction Diet-HS Snack?: Nothing; Room service appropriate?: Yes; Fluid consistency:: Thin  Skin:  Reviewed, no issues  Last BM:  N/A  Height:   Ht Readings from Last 1 Encounters:  01/26/15 5\' 4"  (1.626 m)    Weight:   Wt Readings from Last 1 Encounters:  01/27/15 200 lb 6.4 oz (90.9 kg)    Ideal Body Weight:  54.4 kg  BMI:  Body mass index is 34.38 kg/(m^2).  Estimated Nutritional Needs:   Kcal:  1800-2000  Protein:  90-100 gm  Fluid:  1.8-2.0 L  EDUCATION NEEDS:   No education needs identified at this time  Arthur Holms, RD, LDN Pager #: [phone] After-Hours Pager #: [phone]

## 2015-01-28 NOTE — Progress Notes (Addendum)
[facility] Progress Note   Subjective: Another 3kg off w HD last night, BP's stable, CXR today shows resolved pulm edema. Pt confused, "my butt hurts' is all she will say in response to any questions.  Filed Vitals:   01/28/15 0400 01/28/15 0800 01/28/15 0843 01/28/15 0900  BP: 140/81 138/74 144/85 141/89  Pulse:  108 105 103  Temp: 100.1 F (37.8 C)  99.3 F (37.4 C)   TempSrc: Oral  Oral   Resp: 25 11 20 16   Height:      Weight:      SpO2: 94% 91% 91% 92%   Exam: Stable, alert, confused No jvd Chest is clear bilat RRR tachy no MRG Abd soft, obese ntnd no ascites LE edema resolved Neuro follows simple commands  Dialyzes at [place] TTS EDW 83kg HD Bath 2/2, Dialyzer 180, Heparin none. Access AVF. mircera 150 given 8/6- none given since at [place]. Sensipar 60 daily- no binders on list  Assessment: 1 Altered MS - possibilities include psych/meds- possibly some metabolic derangement due to uremia. Has HD x 2. MS not really much better but at baseline has psychotic issues.  2 Pulm edema - resolved, 6kg off after HD x 2 4 ESRD TTS hd but misses frequently  5 Hypertension: BP's ok, cont MTP 25 bid 6 Anemia of ESRD: on mircera as OP but does not get consistently- nothing needed for now 7 MBD - med list says sensipar 60 daily which will be continued- no binders on med list. Check labs and follow 8 Psych- seems to be major component here- on geodon/doxepin/ativan/depakote - per primary    Plan - plan for HD tomorrow if still here. Overall given her unstable chronic psychiatric illness, limited understanding of her medical conditions and poor compliance with dialysis, she is not a good candidate for chronic dialysis and would consider initiating a conversation with family in this regard.  Could not reach the daughter by phone, have d/w primary MD.     Kelly Splinter MD  pager [phone]    cell [phone]  01/28/2015, 11:31 AM     Recent Labs Lab 01/26/15 1631  01/26/15 1637 01/27/15 0700 01/27/15 1159  NA 141 141 136  --   K 5.9* 5.9* 3.5  --   CL 119* 112* 98*  --   CO2  --  14* 26  --   GLUCOSE 70 72 84  --   BUN 85* 80* 28*  --   CREATININE 5.70* 5.90* 3.36*  --   CALCIUM  --  9.1 9.9  --   PHOS  --   --   --  8.3*    Recent Labs Lab 01/26/15 1637 01/27/15 0700  AST 17 15  ALT 10* 9*  ALKPHOS 117 111  BILITOT 0.5 0.6  PROT 7.4 7.0  ALBUMIN 3.3* 3.2*    Recent Labs Lab 01/26/15 1631 01/26/15 1637 01/27/15 0700  WBC  --  6.0 6.3  HGB 12.9 10.9* 11.1*  HCT 38.0 35.5* 35.0*  MCV  --  101.4* 100.3*  PLT  --  199 PLATELET CLUMPS NOTED ON SMEAR, UNABLE TO ESTIMATE   . cinacalcet  60 mg Oral Q breakfast  . divalproex  500 mg Oral BID  . docusate sodium  100 mg Oral BID  . doxepin  25 mg Oral QHS  . enoxaparin (LOVENOX) injection  30 mg Subcutaneous Q24H  . [START ON 01/29/2015] ferric gluconate (FERRLECIT/NULECIT) IV  250 mg Intravenous Q T,Th,Sa-HD  .  LORazepam  1 mg Intravenous Once  . metoprolol tartrate  25 mg Oral BID  . mometasone-formoterol  2 puff Inhalation BID  . pantoprazole  40 mg Oral Daily  . piperacillin-tazobactam (ZOSYN)  IV  3.375 g Intravenous 3 times per day  . sodium chloride  3 mL Intravenous Q12H  . sodium chloride  3 mL Intravenous Q12H  . ziprasidone  60 mg Oral BID WC     sodium chloride, acetaminophen, bisacodyl, feeding supplement (NEPRO CARB STEADY), LORazepam, ondansetron **OR** ondansetron (ZOFRAN) IV, promethazine, sodium chloride

## 2015-01-28 NOTE — Progress Notes (Signed)
[facility] PROGRESS NOTE   Jennifer Lawson OVF:[id number] DOB: Sep 17, 1960 DOA: 01/26/2015 PCP: [facility]  HPI/Subjective: Patient vomited twice earlier today, we will check abdominal x-ray. At lethargic and PCO2 increases, patient will go for dialysis again today.  I have asked the nurse personally to keep O2 sat between 89 and 92 percent.  Assessment/Plan: Principal Problem:   Change in mental status Active Problems:   HTN (hypertension)   Tobacco use disorder   Hyperkalemia   MDD (major depressive disorder), recurrent, severe, with psychosis   ESRD (end stage renal disease)   Anemia of renal disease    Acute hypercapnic respiratory failure ABG showed pH of 7.2, PCO2 of 62.9, previous PCO2 was 42, avoid excessive oxygen supplementation. BiPAP ordered for PCO2 of 63, patient is more awake, discontinue BiPAP. This is secondary to lethargy and noncardiogenic pulmonary congestion from missing dialysis. Currently on room air, with sats about 92%.  Fever Patient developed fever of 100.1, earlier this morning. There was question of aspiration yesterday, added Zosyn. Patient breathing improving after removal of fluids.  Lethargy Likely due to medications, seizures and/or metabolic acidosis. This is got worse with CO2 narcosis from acute hypercapnic respiratory failure. Patient is somnolent, but currently is stable. She will be dialyzed as per nephrology orders. More awake and alert, but is still somnolent  Metabolic acidosis Continue with hemodialysis orders as per nephrology. Follow-up potassium levels and ABG.  Hyperkalemia Continue with hemodialysis orders as per nephrology.  ESRD (end stage renal disease) Continue with hemodialysis orders as per nephrology. Noncompliant with dialysis sessions secondary to her psychiatric problems. Discussed with Dr. Jonnie Finner of nephrology, needs to discuss with the family about her dialysis and compliance. If patient  not able to keep her dialysis sessions, she might need to consider palliative route.  Anemia of renal disease Epogen as needed and monitor H&H.  HTN (hypertension) Continue current antihypertensive therapy and monitor blood pressure closely.  Tobacco use disorder Will offer nicotine patch once the patient is awake.     MDD (major depressive disorder), recurrent, severe, with psychosis Consider psych evaluation once we get further history.    Code Status: Full Code Family Communication: Plan discussed with the patient. Disposition Plan: Remains inpatient Diet: Diet renal/carb modified with fluid restriction Diet-HS Snack?: Nothing; Room service appropriate?: Yes; Fluid consistency:: Thin  Consultants:  Nephrology  Procedures:  None  Antibiotics:     Objective: Filed Vitals:   01/28/15 0900  BP: 141/89  Pulse: 103  Temp:   Resp: 16    Intake/Output Summary (Last 24 hours) at 01/28/15 1147 Last data filed at 01/27/15 1611  Gross per 24 hour  Intake      0 ml  Output   2903 ml  Net  -2903 ml   Filed Weights   01/26/15 2145 01/27/15 0155 01/27/15 1200  Weight: 93.9 kg (207 lb 0.2 oz) 89.8 kg (197 lb 15.6 oz) 90.9 kg (200 lb 6.4 oz)    Exam: General: Alert and awake, oriented x3, not in any acute distress. HEENT: anicteric sclera, pupils reactive to light and accommodation, EOMI CVS: S1-S2 clear, no murmur rubs or gallops Chest: clear to auscultation bilaterally, no wheezing, rales or rhonchi Abdomen: soft nontender, nondistended, normal bowel sounds, no organomegaly Extremities: no cyanosis, clubbing or edema noted bilaterally Neuro: Cranial nerves II-XII intact, no focal neurological deficits  Data Reviewed: Basic Metabolic Panel:  Recent Labs Lab 01/26/15 1631 01/26/15 1637 01/27/15 0700 01/27/15 1159  NA 141 141 136  --  K 5.9* 5.9* 3.5  --   CL 119* 112* 98*  --   CO2  --  14* 26  --   GLUCOSE 70 72 84  --   BUN 85* 80* 28*  --     CREATININE 5.70* 5.90* 3.36*  --   CALCIUM  --  9.1 9.9  --   PHOS  --   --   --  8.3*   Liver Function Tests:  Recent Labs Lab 01/26/15 1637 01/27/15 0700  AST 17 15  ALT 10* 9*  ALKPHOS 117 111  BILITOT 0.5 0.6  PROT 7.4 7.0  ALBUMIN 3.3* 3.2*   No results for input(s): LIPASE, AMYLASE in the last 168 hours.  Recent Labs Lab 01/26/15 1637  AMMONIA 29   CBC:  Recent Labs Lab 01/26/15 1631 01/26/15 1637 01/27/15 0700  WBC  --  6.0 6.3  HGB 12.9 10.9* 11.1*  HCT 38.0 35.5* 35.0*  MCV  --  101.4* 100.3*  PLT  --  199 PLATELET CLUMPS NOTED ON SMEAR, UNABLE TO ESTIMATE   Cardiac Enzymes:  Recent Labs Lab 01/26/15 1637  TROPONINI 0.03   BNP (last 3 results)  Recent Labs  10/13/14 1603  BNP 659.1*    ProBNP (last 3 results) No results for input(s): PROBNP in the last 8760 hours.  CBG:  Recent Labs Lab 01/26/15 1627 01/26/15 1749 01/26/15 1902  GLUCAP 63* 80 75    Micro Recent Results (from the past 240 hour(s))  MRSA PCR Screening     Status: None   Collection Time: 01/26/15 11:28 PM  Result Value Ref Range Status   MRSA by PCR NEGATIVE NEGATIVE Final    Comment:        The GeneXpert MRSA Assay (FDA approved for NASAL specimens only), is one component of a comprehensive MRSA colonization surveillance program. It is not intended to diagnose MRSA infection nor to guide or monitor treatment for MRSA infections.      Studies: Dg Abd 1 View  01/27/2015   CLINICAL DATA:  54 year old female with new onset vomiting  EXAM: ABDOMEN - 1 VIEW  COMPARISON:  Abdominal radiograph 01/17/2014  FINDINGS: The bowel gas pattern is normal. No radio-opaque calculi or other significant radiographic abnormality are seen.  IMPRESSION: Negative.   Electronically Signed   By: Jacqulynn Cadet M.D.   On: 01/27/2015 19:44   Ct Head Wo Contrast  01/27/2015   CLINICAL DATA:  Altered mental status, lethargy  EXAM: CT HEAD WITHOUT CONTRAST  TECHNIQUE: Contiguous  axial images were obtained from the base of the skull through the vertex without intravenous contrast.  COMPARISON:  01/03/2015  FINDINGS: No evidence of parenchymal hemorrhage or extra-axial fluid collection. No mass lesion, mass effect, or midline shift.  No CT evidence of acute infarction.  Subcortical white matter and periventricular small vessel ischemic changes. Intracranial atherosclerosis.  Cerebral volume is within normal limits.  No ventriculomegaly.  The visualized paranasal sinuses are essentially clear, noting minimal fluid in the left maxillary sinus. The mastoid air cells are unopacified.  No evidence of calvarial fracture.  IMPRESSION: No evidence of acute intracranial abnormality.  Mild small vessel ischemic changes.   Electronically Signed   By: Julian Hy M.D.   On: 01/27/2015 03:56   Dg Chest Port 1 View  01/28/2015   CLINICAL DATA:  Pulmonary edema.  End-stage renal disease.  EXAM: PORTABLE CHEST - 1 VIEW  COMPARISON:  01/27/2015  FINDINGS: Mild enlargement of the cardiopericardial silhouette, without  edema. Vascular clips project over the right upper extremity.  Mild left basilar subsegmental atelectasis.  The lungs appear clear.  IMPRESSION: 1. Mild enlargement of the cardiopericardial silhouette, without edema. 2. Mild left basilar subsegmental atelectasis.   Electronically Signed   By: Van Clines M.D.   On: 01/28/2015 09:50   Dg Chest Port 1 View  01/27/2015   CLINICAL DATA:  Respiratory failure.  End-stage renal disease.  EXAM: PORTABLE CHEST - 1 VIEW  COMPARISON:  01/26/2015  FINDINGS: Cardiomegaly. Low lung volumes with elevation of the left hemidiaphragm and bibasilar atelectasis. Mild vascular congestion. No effusions. No acute bony abnormality.  IMPRESSION: Cardiomegaly.  Low volumes with bibasilar atelectasis.   Electronically Signed   By: Rolm Baptise M.D.   On: 01/27/2015 11:45   Dg Chest Port 1 View  01/26/2015   CLINICAL DATA:  Renal failure air and missed  dialysis treatment with altered mental status  EXAM: PORTABLE CHEST - 1 VIEW  COMPARISON:  10/13/2014  FINDINGS: Cardiac shadow is enlarged. Some mild prominence of the central pulmonary vascularity is noted likely related to fluid overload from the missed dialysis session. Patient is rotated to the right accentuating the mediastinal markings. Mild bibasilar atelectatic changes are seen.  IMPRESSION: Mild central vascular congestion likely related to volume overload.  Mild bibasilar atelectatic changes.   Electronically Signed   By: Inez Catalina M.D.   On: 01/26/2015 16:37    Scheduled Meds: . cinacalcet  60 mg Oral Q breakfast  . divalproex  500 mg Oral BID  . docusate sodium  100 mg Oral BID  . doxepin  25 mg Oral QHS  . enoxaparin (LOVENOX) injection  30 mg Subcutaneous Q24H  . [START ON 01/29/2015] ferric gluconate (FERRLECIT/NULECIT) IV  250 mg Intravenous Q T,Th,Sa-HD  . LORazepam  1 mg Intravenous Once  . metoprolol tartrate  25 mg Oral BID  . mometasone-formoterol  2 puff Inhalation BID  . pantoprazole  40 mg Oral Daily  . piperacillin-tazobactam (ZOSYN)  IV  3.375 g Intravenous 3 times per day  . sodium chloride  3 mL Intravenous Q12H  . sodium chloride  3 mL Intravenous Q12H  . ziprasidone  60 mg Oral BID WC   Continuous Infusions:      Time spent: 35 minutes    [facility]  [facility] Pager [phone] If 7PM-7AM, please contact night-coverage at [email or web address], password [facility] 01/28/2015, 11:47 AM  LOS: 2 days

## 2015-01-28 NOTE — Progress Notes (Signed)
Placed pt on bipap due to decreased LOC.  MD aware.

## 2015-01-29 DIAGNOSIS — F333 Major depressive disorder, recurrent, severe with psychotic symptoms: Secondary | ICD-10-CM | POA: Diagnosis not present

## 2015-01-29 DIAGNOSIS — J811 Chronic pulmonary edema: Secondary | ICD-10-CM | POA: Diagnosis not present

## 2015-01-29 DIAGNOSIS — R011 Cardiac murmur, unspecified: Secondary | ICD-10-CM | POA: Diagnosis not present

## 2015-01-29 DIAGNOSIS — R509 Fever, unspecified: Secondary | ICD-10-CM | POA: Diagnosis not present

## 2015-01-29 DIAGNOSIS — E875 Hyperkalemia: Secondary | ICD-10-CM | POA: Diagnosis not present

## 2015-01-29 DIAGNOSIS — R4182 Altered mental status, unspecified: Secondary | ICD-10-CM | POA: Diagnosis not present

## 2015-01-29 DIAGNOSIS — J9602 Acute respiratory failure with hypercapnia: Secondary | ICD-10-CM | POA: Diagnosis not present

## 2015-01-29 DIAGNOSIS — R569 Unspecified convulsions: Secondary | ICD-10-CM | POA: Diagnosis not present

## 2015-01-29 DIAGNOSIS — E872 Acidosis: Secondary | ICD-10-CM | POA: Diagnosis not present

## 2015-01-29 DIAGNOSIS — D631 Anemia in chronic kidney disease: Secondary | ICD-10-CM | POA: Diagnosis not present

## 2015-01-29 DIAGNOSIS — Z96651 Presence of right artificial knee joint: Secondary | ICD-10-CM | POA: Diagnosis not present

## 2015-01-29 DIAGNOSIS — Z885 Allergy status to narcotic agent status: Secondary | ICD-10-CM | POA: Diagnosis not present

## 2015-01-29 DIAGNOSIS — Z833 Family history of diabetes mellitus: Secondary | ICD-10-CM | POA: Diagnosis not present

## 2015-01-29 DIAGNOSIS — Z992 Dependence on renal dialysis: Secondary | ICD-10-CM | POA: Diagnosis not present

## 2015-01-29 DIAGNOSIS — E1122 Type 2 diabetes mellitus with diabetic chronic kidney disease: Secondary | ICD-10-CM | POA: Diagnosis not present

## 2015-01-29 DIAGNOSIS — Z888 Allergy status to other drugs, medicaments and biological substances status: Secondary | ICD-10-CM | POA: Diagnosis not present

## 2015-01-29 DIAGNOSIS — I120 Hypertensive chronic kidney disease with stage 5 chronic kidney disease or end stage renal disease: Secondary | ICD-10-CM | POA: Diagnosis not present

## 2015-01-29 DIAGNOSIS — F4310 Post-traumatic stress disorder, unspecified: Secondary | ICD-10-CM | POA: Diagnosis not present

## 2015-01-29 DIAGNOSIS — N186 End stage renal disease: Secondary | ICD-10-CM | POA: Diagnosis not present

## 2015-01-29 DIAGNOSIS — Z8249 Family history of ischemic heart disease and other diseases of the circulatory system: Secondary | ICD-10-CM | POA: Diagnosis not present

## 2015-01-29 DIAGNOSIS — J45909 Unspecified asthma, uncomplicated: Secondary | ICD-10-CM | POA: Diagnosis not present

## 2015-01-29 DIAGNOSIS — F17210 Nicotine dependence, cigarettes, uncomplicated: Secondary | ICD-10-CM | POA: Diagnosis not present

## 2015-01-29 DIAGNOSIS — K219 Gastro-esophageal reflux disease without esophagitis: Secondary | ICD-10-CM | POA: Diagnosis not present

## 2015-01-29 DIAGNOSIS — Z79899 Other long term (current) drug therapy: Secondary | ICD-10-CM | POA: Diagnosis not present

## 2015-01-29 DIAGNOSIS — J449 Chronic obstructive pulmonary disease, unspecified: Secondary | ICD-10-CM | POA: Diagnosis not present

## 2015-01-29 MED ORDER — AMOXICILLIN-POT CLAVULANATE 500-125 MG PO TABS: 1.0000 | ORAL_TABLET | Freq: Three times a day (TID) | ORAL | Status: DC

## 2015-01-29 MED ORDER — AMOXICILLIN-POT CLAVULANATE 500-125 MG PO TABS: 1.0000 | ORAL_TABLET | Freq: Every day | ORAL | Status: DC

## 2015-01-29 MED ORDER — BIOTENE ORALBALANCE DRY MOUTH MT LIQD: OROMUCOSAL | Status: DC

## 2015-01-29 MED ORDER — AMOXICILLIN-POT CLAVULANATE 875-125 MG PO TABS: 1.0000 | ORAL_TABLET | Freq: Two times a day (BID) | ORAL | Status: DC

## 2015-01-29 MED ORDER — PIPERACILLIN-TAZOBACTAM IN DEX 2-0.25 GM/50ML IV SOLN
2.2500 g | Freq: Three times a day (TID) | INTRAVENOUS | Status: DC
  Filled 2015-01-29 (×3): qty 50

## 2015-01-29 NOTE — Progress Notes (Signed)
Pt taken out to car by RN, all questions answered. Pt stable and CCM called.

## 2015-01-29 NOTE — Progress Notes (Signed)
Pt refused all night time medication prior to discharge. She stated that she would take everything when she gets home. 2

## 2015-01-29 NOTE — Progress Notes (Signed)
Discharge summery/ AVS discussed with pt, prescriptions also given to pt. Pt asked about Nicotine patch, educated pt that she can purchase the patches over the counter/ per NP. Pt does not have clothing to go home in, pt receiving paper scrubs. Called to confirm and pts daughter will pick her up at 9pm.

## 2015-01-29 NOTE — Progress Notes (Signed)
[facility] Progress Note   Subjective: on HD today, apologetic about missing HD as she was told today that if she can't continue to keep her dialysis appointments she may lose her dialysis privileges w [facility]  Filed Vitals:   01/29/15 1100 01/29/15 1130 01/29/15 1200 01/29/15 1220  BP: 91/63 87/43 87/40  100/62  Pulse: 84 86 88 86  Temp:    98.4 F (36.9 C)  TempSrc:    Oral  Resp:  18  20  Height:      Weight:    85.5 kg (188 lb 7.9 oz)  SpO2:    98%   Exam: Stable, alert, confused No jvd Chest is clear bilat RRR tachy no MRG Abd soft, obese ntnd no ascites LE edema resolved Neuro follows simple commands  Dialyzes at [place] 83kg HD Bath 2/2, Dialyzer 180, Heparin none. Access AVF. mircera 150 given 8/6- none given since at [place]. Sensipar 60 daily- no binders on list  Assessment: 1 Altered MS - prob uremia, MS much better 2 Vol excess / pulm edema - resolved 4 ESRD TTS hd, misses frequently, discussed at length with patient as to potential consequences if she continues to miss dialysis frequently 5 Hypertension: BP's ok, cont MTP 25 bid 6 Anemia of ESRD: on mircera as OP but does not get consistently- nothing needed for now 7 MBD - med list says sensipar 60 daily which will be continued- no binders on med list. Check labs and follow 8 Psych- on geodon/doxepin/ativan/depakote - per primary    Plan - HD today, ok for dc after HD.  Keep same dry wt as previous.      Kelly Splinter MD  pager [phone]    cell [phone]  01/29/2015, 12:37 PM     Recent Labs Lab 01/26/15 1637 01/27/15 0700 01/27/15 1159 01/28/15 2223  NA 141 136  --  131*  K 5.9* 3.5  --  3.8  CL 112* 98*  --  94*  CO2 14* 26  --  26  GLUCOSE 72 84  --  156*  BUN 80* 28*  --  42*  CREATININE 5.90* 3.36*  --  5.56*  CALCIUM 9.1 9.9  --  8.7*  PHOS  --   --  8.3*  --     Recent Labs Lab 01/26/15 1637 01/27/15 0700  AST 17 15  ALT 10* 9*  ALKPHOS 117 111  BILITOT  0.5 0.6  PROT 7.4 7.0  ALBUMIN 3.3* 3.2*    Recent Labs Lab 01/26/15 1637 01/27/15 0700 01/28/15 2223  WBC 6.0 6.3 5.7  HGB 10.9* 11.1* 10.9*  HCT 35.5* 35.0* 34.0*  MCV 101.4* 100.3* 99.7  PLT 199 PLATELET CLUMPS NOTED ON SMEAR, UNABLE TO ESTIMATE 183   . cinacalcet  60 mg Oral Q breakfast  . divalproex  500 mg Oral BID  . docusate sodium  100 mg Oral BID  . doxepin  25 mg Oral QHS  . enoxaparin (LOVENOX) injection  30 mg Subcutaneous Q24H  . feeding supplement (NEPRO CARB STEADY)  237 mL Oral BID BM  . ferric gluconate (FERRLECIT/NULECIT) IV  250 mg Intravenous Q T,Th,Sa-HD  . Influenza vac split quadrivalent PF  0.5 mL Intramuscular Tomorrow-1000  . LORazepam  1 mg Intravenous Once  . metoprolol tartrate  25 mg Oral BID  . mometasone-formoterol  2 puff Inhalation BID  . nicotine  21 mg Transdermal Daily  . pantoprazole  40 mg Oral Daily  . piperacillin-tazobactam (ZOSYN)  IV  3.375 g Intravenous 3 times per day  . pneumococcal 23 valent vaccine  0.5 mL Intramuscular Tomorrow-1000  . sodium chloride  3 mL Intravenous Q12H  . sodium chloride  3 mL Intravenous Q12H  . ziprasidone  60 mg Oral BID WC     sodium chloride, acetaminophen, bisacodyl, LORazepam, ondansetron **OR** ondansetron (ZOFRAN) IV, sodium chloride

## 2015-01-29 NOTE — Care Management Important Message (Signed)
Important Message  Patient Details  Name: Jennifer Lawson MRN: [id number] Date of Birth: [birth year]   Medicare Important Message Given:  Yes-second notification given    Delorse Lek 01/29/2015, 9:04 AM

## 2015-01-29 NOTE — Progress Notes (Signed)
Discharge orders discussed with pt, daughter called and made aware. Daughter stated she is working and can not pick her mother up until 2100 tonight. Pt stable, calm and cooperative. Will continue to monitor until time of discharge. Dorena Cookey, RN

## 2015-01-29 NOTE — Discharge Summary (Addendum)
Physician Discharge Summary  Jennifer Lawson GEX:[id number] DOB: 11-Jul-1960 DOA: 01/26/2015  PCP: [facility]  Admit date: 01/26/2015 Discharge date: 01/29/2015  Time spent: 40 minutes  Recommendations for Outpatient Follow-up:  1. Follow-up with primary care physician within one week.  Discharge Diagnoses:  Principal Problem:   Change in mental status Active Problems:   HTN (hypertension)   Tobacco use disorder   Hyperkalemia   MDD (major depressive disorder), recurrent, severe, with psychosis   ESRD (end stage renal disease)   Anemia of renal disease   Discharge Condition: Stable  Diet recommendation: Renal diet  Filed Weights   01/27/15 1200 01/29/15 0850 01/29/15 1220  Weight: 90.9 kg (200 lb 6.4 oz) 87.5 kg (192 lb 14.4 oz) 85.5 kg (188 lb 7.9 oz)    History of present illness:  Jennifer Lawson is a 54 y.o. female with a PMH of bipolar disorder, seizures, end-stage renal disease on hemodialysis, hypertension, tobacco abuse disorder, COPD, type 2 diabetes who was brought to the emergency department due to being somnolent and confused at home. Patient has a history of seizures and an extensive psychiatric history as well. She is currently unable to provide history and no family members are present at this time. When seen the patient was somnolent, responsive to touch, opened eyes for moment a moment, but wouldn't answer simple questions.  Workup in the ER reveals hyperkalemia and metabolic acidosis.  Hospital Course:   Acute hypercapnic respiratory failure ABG showed pH of 7.2, PCO2 of 62.9, previous PCO2 was 42, avoid excessive oxygen supplementation. BiPAP ordered for PCO2 of 63, patient is more awake, BiPAP discontinued This is secondary to lethargy and noncardiogenic pulmonary congestion from missing dialysis. Currently on room air, with sats about 92%, resolved.  Fever Patient developed fever of 100.1, earlier this morning. There was question of  aspiration yesterday, added Zosyn. Cannot rule out aspiration, patient discharged on Augmentin for 5 more days.  Lethargy Likely due to medications, seizures and/or metabolic acidosis. This is got worse with CO2 narcosis from acute hypercapnic respiratory failure. Patient is somnolent, but currently is stable. She will be dialyzed as per nephrology orders. This is resolved prior to discharge, after the resolution of pulmonary congestion with dialysis.  Metabolic acidosis Continue with hemodialysis orders as per nephrology. Follow-up potassium levels and ABG.  Hyperkalemia Continue with hemodialysis orders as per nephrology.  ESRD (end stage renal disease) Continue with hemodialysis orders as per nephrology. Noncompliant with dialysis sessions secondary to her psychiatric problems. Discussed with Dr. Jonnie Finner of nephrology, needs to discuss with the family about her dialysis and compliance. Spoke with the patient and her daughter, they want to continue dialysis.  Per daughter she is not compliant with fluid restriction because of dry mouth. Patient might have polydipsia secondary to dry mouth from appropriate medications, Biotene mouth wash added.  Anemia of renal disease Epogen as needed and monitor H&H.  HTN (hypertension) Continue current antihypertensive therapy and monitor blood pressure closely.  Tobacco use disorder Will offer nicotine patch once the patient is awake. I counseled the patient extensively about tobacco abuse.     MDD (major depressive disorder), recurrent, severe, with psychosis Patient this morning looks very reasonable, carries conversation without any problems. Denies any manic behavior, wants to continue dialysis, does not want to take Depakote.    Procedures:  Hemodialysis  Consultations:  Nephrology  Discharge Exam: Filed Vitals:   01/29/15 1318  BP: 121/72  Pulse: 89  Temp: 98.9 F (37.2 C)  Resp: 23   General: Alert and awake,  oriented x3, not in any acute distress. HEENT: anicteric sclera, pupils reactive to light and accommodation, EOMI CVS: S1-S2 clear, no murmur rubs or gallops Chest: clear to auscultation bilaterally, no wheezing, rales or rhonchi Abdomen: soft nontender, nondistended, normal bowel sounds, no organomegaly Extremities: no cyanosis, clubbing or edema noted bilaterally Neuro: Cranial nerves II-XII intact, no focal neurological deficits  Discharge Instructions   Discharge Instructions    Diet - low sodium heart healthy    Complete by:  As directed      Increase activity slowly    Complete by:  As directed           Current Discharge Medication List    START taking these medications   Details  amoxicillin-clavulanate (AUGMENTIN) 500-125 MG per tablet Take 1 tablet (500 mg total) by mouth daily. Qty: 5 tablet, Refills: 0    Artificial Saliva (BIOTENE ORALBALANCE DRY MOUTH) LIQD Rinse your mouth every 6 hours. Qty: 1 Bottle, Refills: 12      CONTINUE these medications which have NOT CHANGED   Details  acetaminophen (TYLENOL) 325 MG tablet Take 2 tablets (650 mg total) by mouth every 4 (four) hours as needed for fever, headache or mild pain.    albuterol (PROVENTIL HFA;VENTOLIN HFA) 108 (90 BASE) MCG/ACT inhaler Inhale 2 puffs into the lungs every 6 (six) hours as needed for wheezing or shortness of breath. Qty: 1 Inhaler, Refills: 0    cinacalcet (SENSIPAR) 60 MG tablet Take 60 mg by mouth daily.    divalproex (DEPAKOTE ER) 250 MG 24 hr tablet Take 500 mg by mouth 2 (two) times daily.    doxepin (SINEQUAN) 25 MG capsule Take 25 mg by mouth at bedtime. Refills: 1    ibuprofen (ADVIL,MOTRIN) 200 MG tablet Take 600 mg by mouth every 6 (six) hours as needed for mild pain, moderate pain or cramping.    LORazepam (ATIVAN) 0.5 MG tablet Take 0.5 mg by mouth daily as needed for anxiety.    simvastatin (ZOCOR) 10 MG tablet Take 10 mg by mouth at bedtime. Refills: 5    ziprasidone  (GEODON) 60 MG capsule Take 1 capsule (60 mg total) by mouth 2 (two) times daily with a meal. For mood control Qty: 60 capsule, Refills: 0    bisacodyl (DULCOLAX) 5 MG EC tablet Take 1 tablet (5 mg total) by mouth daily as needed for moderate constipation. Qty: 30 tablet, Refills: 0    Darbepoetin Alfa (ARANESP) 60 MCG/0.3ML SOSY injection Inject 0.3 mLs (60 mcg total) into the vein every Thursday with hemodialysis. Qty: 4.2 mL    docusate sodium 100 MG CAPS Take 100 mg by mouth 2 (two) times daily. Qty: 10 capsule, Refills: 0    ferric gluconate 250 mg in sodium chloride 0.9 % 100 mL Inject 250 mg into the vein every Tuesday, Thursday, Saturday, and Sunday.    metoprolol tartrate (LOPRESSOR) 25 MG tablet Take 0.5 tablets (12.5 mg total) by mouth 2 (two) times daily. Qty: 15 tablet, Refills: 0    mometasone-formoterol (DULERA) 100-5 MCG/ACT AERO Inhale 2 puffs into the lungs 2 (two) times daily. Qty: 1 Inhaler, Refills: 0    Nutritional Supplements (FEEDING SUPPLEMENT, NEPRO CARB STEADY,) LIQD Take 237 mLs by mouth as needed (missed meal during dialysis.). Refills: 0    pantoprazole (PROTONIX) 40 MG tablet Take 1 tablet (40 mg total) by mouth daily. Qty: 30 tablet, Refills: 0       Allergies  Allergen Reactions  . Codeine Sulfate Anaphylaxis    Daughter called about having this allergy   . Depakote [Divalproex Sodium] Other (See Comments)    Nose bleeds  . Gabapentin Other (See Comments)    seizures  . Haldol [Haloperidol Lactate] Shortness Of Breath  . Risperidone And Related Shortness Of Breath  . Trazodone And Nefazodone Other (See Comments)    Makes pt lose balance and fall  . Invega [Paliperidone Er] Nausea And Vomiting  . Vistaril [Hydroxyzine Hcl] Nausea And Vomiting   Follow-up Information    Follow up with [facility] In 1 week.   Specialty:  Internal Medicine   Contact information:   [place]  [address] [phone]        The  results of significant diagnostics from this hospitalization (including imaging, microbiology, ancillary and laboratory) are listed below for reference.    Significant Diagnostic Studies: Dg Abd 1 View  01/27/2015   CLINICAL DATA:  54 year old female with new onset vomiting  EXAM: ABDOMEN - 1 VIEW  COMPARISON:  Abdominal radiograph 01/17/2014  FINDINGS: The bowel gas pattern is normal. No radio-opaque calculi or other significant radiographic abnormality are seen.  IMPRESSION: Negative.   Electronically Signed   By: Jacqulynn Cadet M.D.   On: 01/27/2015 19:44   Ct Head Wo Contrast  01/27/2015   CLINICAL DATA:  Altered mental status, lethargy  EXAM: CT HEAD WITHOUT CONTRAST  TECHNIQUE: Contiguous axial images were obtained from the base of the skull through the vertex without intravenous contrast.  COMPARISON:  01/03/2015  FINDINGS: No evidence of parenchymal hemorrhage or extra-axial fluid collection. No mass lesion, mass effect, or midline shift.  No CT evidence of acute infarction.  Subcortical white matter and periventricular small vessel ischemic changes. Intracranial atherosclerosis.  Cerebral volume is within normal limits.  No ventriculomegaly.  The visualized paranasal sinuses are essentially clear, noting minimal fluid in the left maxillary sinus. The mastoid air cells are unopacified.  No evidence of calvarial fracture.  IMPRESSION: No evidence of acute intracranial abnormality.  Mild small vessel ischemic changes.   Electronically Signed   By: Julian Hy M.D.   On: 01/27/2015 03:56   Ct Head Wo Contrast  01/03/2015   CLINICAL DATA:  Altered mental status. Patient is uncooperative and refusing medications and dialysis.  EXAM: CT HEAD WITHOUT CONTRAST  TECHNIQUE: Contiguous axial images were obtained from the base of the skull through the vertex without intravenous contrast.  COMPARISON:  MRI brain 05/14/2014.  CT head 05/13/2014.  FINDINGS: Ventricles and sulci appear symmetrical. No  mass effect or midline shift. No abnormal extra-axial fluid collections. Gray-white matter junctions are distinct. Basal cisterns are not effaced. No evidence of acute intracranial hemorrhage. No depressed skull fractures. Retention cyst in the left maxillary antrum. Paranasal sinuses are otherwise clear. Mastoid air cells are not opacified. Vascular calcifications.  IMPRESSION: No acute intracranial abnormalities.   Electronically Signed   By: Lucienne Capers M.D.   On: 01/03/2015 22:13   Dg Chest Port 1 View  01/28/2015   CLINICAL DATA:  Pulmonary edema.  End-stage renal disease.  EXAM: PORTABLE CHEST - 1 VIEW  COMPARISON:  01/27/2015  FINDINGS: Mild enlargement of the cardiopericardial silhouette, without edema. Vascular clips project over the right upper extremity.  Mild left basilar subsegmental atelectasis.  The lungs appear clear.  IMPRESSION: 1. Mild enlargement of the cardiopericardial silhouette, without edema. 2. Mild left basilar subsegmental atelectasis.   Electronically Signed   By: Thayer Jew  Janeece Fitting M.D.   On: 01/28/2015 09:50   Dg Chest Port 1 View  01/27/2015   CLINICAL DATA:  Respiratory failure.  End-stage renal disease.  EXAM: PORTABLE CHEST - 1 VIEW  COMPARISON:  01/26/2015  FINDINGS: Cardiomegaly. Low lung volumes with elevation of the left hemidiaphragm and bibasilar atelectasis. Mild vascular congestion. No effusions. No acute bony abnormality.  IMPRESSION: Cardiomegaly.  Low volumes with bibasilar atelectasis.   Electronically Signed   By: Rolm Baptise M.D.   On: 01/27/2015 11:45   Dg Chest Port 1 View  01/26/2015   CLINICAL DATA:  Renal failure air and missed dialysis treatment with altered mental status  EXAM: PORTABLE CHEST - 1 VIEW  COMPARISON:  10/13/2014  FINDINGS: Cardiac shadow is enlarged. Some mild prominence of the central pulmonary vascularity is noted likely related to fluid overload from the missed dialysis session. Patient is rotated to the right accentuating the  mediastinal markings. Mild bibasilar atelectatic changes are seen.  IMPRESSION: Mild central vascular congestion likely related to volume overload.  Mild bibasilar atelectatic changes.   Electronically Signed   By: Inez Catalina M.D.   On: 01/26/2015 16:37    Microbiology: Recent Results (from the past 240 hour(s))  MRSA PCR Screening     Status: None   Collection Time: 01/26/15 11:28 PM  Result Value Ref Range Status   MRSA by PCR NEGATIVE NEGATIVE Final    Comment:        The GeneXpert MRSA Assay (FDA approved for NASAL specimens only), is one component of a comprehensive MRSA colonization surveillance program. It is not intended to diagnose MRSA infection nor to guide or monitor treatment for MRSA infections.      Labs: Basic Metabolic Panel:  Recent Labs Lab 01/26/15 1631 01/26/15 1637 01/27/15 0700 01/27/15 1159 01/28/15 2223  NA 141 141 136  --  131*  K 5.9* 5.9* 3.5  --  3.8  CL 119* 112* 98*  --  94*  CO2  --  14* 26  --  26  GLUCOSE 70 72 84  --  156*  BUN 85* 80* 28*  --  42*  CREATININE 5.70* 5.90* 3.36*  --  5.56*  CALCIUM  --  9.1 9.9  --  8.7*  PHOS  --   --   --  8.3*  --    Liver Function Tests:  Recent Labs Lab 01/26/15 1637 01/27/15 0700  AST 17 15  ALT 10* 9*  ALKPHOS 117 111  BILITOT 0.5 0.6  PROT 7.4 7.0  ALBUMIN 3.3* 3.2*   No results for input(s): LIPASE, AMYLASE in the last 168 hours.  Recent Labs Lab 01/26/15 1637  AMMONIA 29   CBC:  Recent Labs Lab 01/26/15 1631 01/26/15 1637 01/27/15 0700 01/28/15 2223  WBC  --  6.0 6.3 5.7  HGB 12.9 10.9* 11.1* 10.9*  HCT 38.0 35.5* 35.0* 34.0*  MCV  --  101.4* 100.3* 99.7  PLT  --  199 PLATELET CLUMPS NOTED ON SMEAR, UNABLE TO ESTIMATE 183   Cardiac Enzymes:  Recent Labs Lab 01/26/15 1637  TROPONINI 0.03   BNP: BNP (last 3 results)  Recent Labs  10/13/14 1603  BNP 659.1*    ProBNP (last 3 results) No results for input(s): PROBNP in the last 8760  hours.  CBG:  Recent Labs Lab 01/26/15 1627 01/26/15 1749 01/26/15 1902  GLUCAP 63* 80 75       Signed:  Alysandra Lobue A  [facility] 01/29/2015, 1:36 PM

## 2015-01-31 DIAGNOSIS — N186 End stage renal disease: Secondary | ICD-10-CM | POA: Diagnosis not present

## 2015-01-31 DIAGNOSIS — R52 Pain, unspecified: Secondary | ICD-10-CM | POA: Diagnosis not present

## 2015-02-03 DIAGNOSIS — R52 Pain, unspecified: Secondary | ICD-10-CM | POA: Diagnosis not present

## 2015-02-03 DIAGNOSIS — N186 End stage renal disease: Secondary | ICD-10-CM | POA: Diagnosis not present

## 2015-02-06 DIAGNOSIS — Z992 Dependence on renal dialysis: Secondary | ICD-10-CM | POA: Diagnosis not present

## 2015-02-06 DIAGNOSIS — N186 End stage renal disease: Secondary | ICD-10-CM | POA: Diagnosis not present

## 2015-02-06 DIAGNOSIS — I129 Hypertensive chronic kidney disease with stage 1 through stage 4 chronic kidney disease, or unspecified chronic kidney disease: Secondary | ICD-10-CM | POA: Diagnosis not present

## 2015-02-07 DIAGNOSIS — N186 End stage renal disease: Secondary | ICD-10-CM | POA: Diagnosis not present

## 2015-02-07 DIAGNOSIS — R52 Pain, unspecified: Secondary | ICD-10-CM | POA: Diagnosis not present

## 2015-02-07 DIAGNOSIS — D509 Iron deficiency anemia, unspecified: Secondary | ICD-10-CM | POA: Diagnosis not present

## 2015-02-07 DIAGNOSIS — D631 Anemia in chronic kidney disease: Secondary | ICD-10-CM | POA: Diagnosis not present

## 2015-02-10 DIAGNOSIS — N186 End stage renal disease: Secondary | ICD-10-CM | POA: Diagnosis not present

## 2015-02-10 DIAGNOSIS — R52 Pain, unspecified: Secondary | ICD-10-CM | POA: Diagnosis not present

## 2015-02-10 DIAGNOSIS — D509 Iron deficiency anemia, unspecified: Secondary | ICD-10-CM | POA: Diagnosis not present

## 2015-02-10 DIAGNOSIS — D631 Anemia in chronic kidney disease: Secondary | ICD-10-CM | POA: Diagnosis not present

## 2015-02-12 DIAGNOSIS — R52 Pain, unspecified: Secondary | ICD-10-CM | POA: Diagnosis not present

## 2015-02-12 DIAGNOSIS — N186 End stage renal disease: Secondary | ICD-10-CM | POA: Diagnosis not present

## 2015-02-12 DIAGNOSIS — D509 Iron deficiency anemia, unspecified: Secondary | ICD-10-CM | POA: Diagnosis not present

## 2015-02-12 DIAGNOSIS — D631 Anemia in chronic kidney disease: Secondary | ICD-10-CM | POA: Diagnosis not present

## 2015-02-16 DIAGNOSIS — J449 Chronic obstructive pulmonary disease, unspecified: Secondary | ICD-10-CM | POA: Diagnosis not present

## 2015-02-16 DIAGNOSIS — N186 End stage renal disease: Secondary | ICD-10-CM | POA: Diagnosis not present

## 2015-02-17 DIAGNOSIS — N186 End stage renal disease: Secondary | ICD-10-CM | POA: Diagnosis not present

## 2015-02-17 DIAGNOSIS — D509 Iron deficiency anemia, unspecified: Secondary | ICD-10-CM | POA: Diagnosis not present

## 2015-02-17 DIAGNOSIS — R269 Unspecified abnormalities of gait and mobility: Secondary | ICD-10-CM | POA: Diagnosis not present

## 2015-02-17 DIAGNOSIS — R52 Pain, unspecified: Secondary | ICD-10-CM | POA: Diagnosis not present

## 2015-02-17 DIAGNOSIS — J449 Chronic obstructive pulmonary disease, unspecified: Secondary | ICD-10-CM | POA: Diagnosis not present

## 2015-02-17 DIAGNOSIS — D631 Anemia in chronic kidney disease: Secondary | ICD-10-CM | POA: Diagnosis not present

## 2015-02-18 DIAGNOSIS — I10 Essential (primary) hypertension: Secondary | ICD-10-CM | POA: Diagnosis not present

## 2015-02-18 DIAGNOSIS — R7309 Other abnormal glucose: Secondary | ICD-10-CM | POA: Diagnosis not present

## 2015-02-18 DIAGNOSIS — J069 Acute upper respiratory infection, unspecified: Secondary | ICD-10-CM | POA: Diagnosis not present

## 2015-02-18 DIAGNOSIS — J449 Chronic obstructive pulmonary disease, unspecified: Secondary | ICD-10-CM | POA: Diagnosis not present

## 2015-02-18 DIAGNOSIS — N186 End stage renal disease: Secondary | ICD-10-CM | POA: Diagnosis not present

## 2015-02-25 DIAGNOSIS — N186 End stage renal disease: Secondary | ICD-10-CM | POA: Diagnosis not present

## 2015-02-25 DIAGNOSIS — D631 Anemia in chronic kidney disease: Secondary | ICD-10-CM | POA: Diagnosis not present

## 2015-02-25 DIAGNOSIS — D509 Iron deficiency anemia, unspecified: Secondary | ICD-10-CM | POA: Diagnosis not present

## 2015-02-25 DIAGNOSIS — R52 Pain, unspecified: Secondary | ICD-10-CM | POA: Diagnosis not present

## 2015-02-27 DIAGNOSIS — N186 End stage renal disease: Secondary | ICD-10-CM | POA: Diagnosis not present

## 2015-02-27 DIAGNOSIS — D509 Iron deficiency anemia, unspecified: Secondary | ICD-10-CM | POA: Diagnosis not present

## 2015-02-27 DIAGNOSIS — R52 Pain, unspecified: Secondary | ICD-10-CM | POA: Diagnosis not present

## 2015-02-27 DIAGNOSIS — D631 Anemia in chronic kidney disease: Secondary | ICD-10-CM | POA: Diagnosis not present

## 2015-03-04 DIAGNOSIS — N186 End stage renal disease: Secondary | ICD-10-CM | POA: Diagnosis not present

## 2015-03-04 DIAGNOSIS — D631 Anemia in chronic kidney disease: Secondary | ICD-10-CM | POA: Diagnosis not present

## 2015-03-04 DIAGNOSIS — R52 Pain, unspecified: Secondary | ICD-10-CM | POA: Diagnosis not present

## 2015-03-04 DIAGNOSIS — D509 Iron deficiency anemia, unspecified: Secondary | ICD-10-CM | POA: Diagnosis not present

## 2015-03-06 DIAGNOSIS — J449 Chronic obstructive pulmonary disease, unspecified: Secondary | ICD-10-CM | POA: Diagnosis not present

## 2015-03-06 DIAGNOSIS — F319 Bipolar disorder, unspecified: Secondary | ICD-10-CM | POA: Diagnosis not present

## 2015-03-06 DIAGNOSIS — F4310 Post-traumatic stress disorder, unspecified: Secondary | ICD-10-CM | POA: Diagnosis not present

## 2015-03-06 DIAGNOSIS — Z9981 Dependence on supplemental oxygen: Secondary | ICD-10-CM | POA: Diagnosis not present

## 2015-03-06 DIAGNOSIS — I120 Hypertensive chronic kidney disease with stage 5 chronic kidney disease or end stage renal disease: Secondary | ICD-10-CM | POA: Diagnosis not present

## 2015-03-06 DIAGNOSIS — D631 Anemia in chronic kidney disease: Secondary | ICD-10-CM | POA: Diagnosis not present

## 2015-03-06 DIAGNOSIS — N186 End stage renal disease: Secondary | ICD-10-CM | POA: Diagnosis not present

## 2015-03-06 DIAGNOSIS — E1122 Type 2 diabetes mellitus with diabetic chronic kidney disease: Secondary | ICD-10-CM | POA: Diagnosis not present

## 2015-03-06 DIAGNOSIS — Z720 Tobacco use: Secondary | ICD-10-CM | POA: Diagnosis not present

## 2015-03-06 DIAGNOSIS — Z79899 Other long term (current) drug therapy: Secondary | ICD-10-CM | POA: Diagnosis not present

## 2015-03-06 DIAGNOSIS — Z992 Dependence on renal dialysis: Secondary | ICD-10-CM | POA: Diagnosis not present

## 2015-03-06 DIAGNOSIS — R569 Unspecified convulsions: Secondary | ICD-10-CM | POA: Diagnosis not present

## 2015-03-06 DIAGNOSIS — J9611 Chronic respiratory failure with hypoxia: Secondary | ICD-10-CM | POA: Diagnosis not present

## 2015-03-09 DIAGNOSIS — I129 Hypertensive chronic kidney disease with stage 1 through stage 4 chronic kidney disease, or unspecified chronic kidney disease: Secondary | ICD-10-CM | POA: Diagnosis not present

## 2015-03-09 DIAGNOSIS — Z992 Dependence on renal dialysis: Secondary | ICD-10-CM | POA: Diagnosis not present

## 2015-03-09 DIAGNOSIS — N186 End stage renal disease: Secondary | ICD-10-CM | POA: Diagnosis not present

## 2015-03-10 DIAGNOSIS — Z720 Tobacco use: Secondary | ICD-10-CM | POA: Diagnosis not present

## 2015-03-10 DIAGNOSIS — F4310 Post-traumatic stress disorder, unspecified: Secondary | ICD-10-CM | POA: Diagnosis not present

## 2015-03-10 DIAGNOSIS — R569 Unspecified convulsions: Secondary | ICD-10-CM | POA: Diagnosis not present

## 2015-03-10 DIAGNOSIS — J449 Chronic obstructive pulmonary disease, unspecified: Secondary | ICD-10-CM | POA: Diagnosis not present

## 2015-03-10 DIAGNOSIS — I120 Hypertensive chronic kidney disease with stage 5 chronic kidney disease or end stage renal disease: Secondary | ICD-10-CM | POA: Diagnosis not present

## 2015-03-10 DIAGNOSIS — F319 Bipolar disorder, unspecified: Secondary | ICD-10-CM | POA: Diagnosis not present

## 2015-03-10 DIAGNOSIS — Z9981 Dependence on supplemental oxygen: Secondary | ICD-10-CM | POA: Diagnosis not present

## 2015-03-10 DIAGNOSIS — Z992 Dependence on renal dialysis: Secondary | ICD-10-CM | POA: Diagnosis not present

## 2015-03-10 DIAGNOSIS — J9611 Chronic respiratory failure with hypoxia: Secondary | ICD-10-CM | POA: Diagnosis not present

## 2015-03-10 DIAGNOSIS — J019 Acute sinusitis, unspecified: Secondary | ICD-10-CM | POA: Diagnosis not present

## 2015-03-10 DIAGNOSIS — E1122 Type 2 diabetes mellitus with diabetic chronic kidney disease: Secondary | ICD-10-CM | POA: Diagnosis not present

## 2015-03-10 DIAGNOSIS — I10 Essential (primary) hypertension: Secondary | ICD-10-CM | POA: Diagnosis not present

## 2015-03-10 DIAGNOSIS — N186 End stage renal disease: Secondary | ICD-10-CM | POA: Diagnosis not present

## 2015-03-10 DIAGNOSIS — R7309 Other abnormal glucose: Secondary | ICD-10-CM | POA: Diagnosis not present

## 2015-03-10 DIAGNOSIS — K5289 Other specified noninfective gastroenteritis and colitis: Secondary | ICD-10-CM | POA: Diagnosis not present

## 2015-03-10 DIAGNOSIS — Z79899 Other long term (current) drug therapy: Secondary | ICD-10-CM | POA: Diagnosis not present

## 2015-03-10 DIAGNOSIS — D631 Anemia in chronic kidney disease: Secondary | ICD-10-CM | POA: Diagnosis not present

## 2015-03-12 DIAGNOSIS — D631 Anemia in chronic kidney disease: Secondary | ICD-10-CM | POA: Diagnosis not present

## 2015-03-12 DIAGNOSIS — R52 Pain, unspecified: Secondary | ICD-10-CM | POA: Diagnosis not present

## 2015-03-12 DIAGNOSIS — D509 Iron deficiency anemia, unspecified: Secondary | ICD-10-CM | POA: Diagnosis not present

## 2015-03-12 DIAGNOSIS — N186 End stage renal disease: Secondary | ICD-10-CM | POA: Diagnosis not present

## 2015-03-12 DIAGNOSIS — N2581 Secondary hyperparathyroidism of renal origin: Secondary | ICD-10-CM | POA: Diagnosis not present

## 2015-03-13 DIAGNOSIS — Z9981 Dependence on supplemental oxygen: Secondary | ICD-10-CM | POA: Diagnosis not present

## 2015-03-13 DIAGNOSIS — Z720 Tobacco use: Secondary | ICD-10-CM | POA: Diagnosis not present

## 2015-03-13 DIAGNOSIS — Z992 Dependence on renal dialysis: Secondary | ICD-10-CM | POA: Diagnosis not present

## 2015-03-13 DIAGNOSIS — I120 Hypertensive chronic kidney disease with stage 5 chronic kidney disease or end stage renal disease: Secondary | ICD-10-CM | POA: Diagnosis not present

## 2015-03-13 DIAGNOSIS — J9611 Chronic respiratory failure with hypoxia: Secondary | ICD-10-CM | POA: Diagnosis not present

## 2015-03-13 DIAGNOSIS — N2581 Secondary hyperparathyroidism of renal origin: Secondary | ICD-10-CM | POA: Diagnosis not present

## 2015-03-13 DIAGNOSIS — R52 Pain, unspecified: Secondary | ICD-10-CM | POA: Diagnosis not present

## 2015-03-13 DIAGNOSIS — D631 Anemia in chronic kidney disease: Secondary | ICD-10-CM | POA: Diagnosis not present

## 2015-03-13 DIAGNOSIS — J449 Chronic obstructive pulmonary disease, unspecified: Secondary | ICD-10-CM | POA: Diagnosis not present

## 2015-03-13 DIAGNOSIS — E1122 Type 2 diabetes mellitus with diabetic chronic kidney disease: Secondary | ICD-10-CM | POA: Diagnosis not present

## 2015-03-13 DIAGNOSIS — F319 Bipolar disorder, unspecified: Secondary | ICD-10-CM | POA: Diagnosis not present

## 2015-03-13 DIAGNOSIS — N186 End stage renal disease: Secondary | ICD-10-CM | POA: Diagnosis not present

## 2015-03-13 DIAGNOSIS — F4310 Post-traumatic stress disorder, unspecified: Secondary | ICD-10-CM | POA: Diagnosis not present

## 2015-03-13 DIAGNOSIS — R569 Unspecified convulsions: Secondary | ICD-10-CM | POA: Diagnosis not present

## 2015-03-13 DIAGNOSIS — Z79899 Other long term (current) drug therapy: Secondary | ICD-10-CM | POA: Diagnosis not present

## 2015-03-13 DIAGNOSIS — D509 Iron deficiency anemia, unspecified: Secondary | ICD-10-CM | POA: Diagnosis not present

## 2015-03-17 DIAGNOSIS — D631 Anemia in chronic kidney disease: Secondary | ICD-10-CM | POA: Diagnosis not present

## 2015-03-17 DIAGNOSIS — R52 Pain, unspecified: Secondary | ICD-10-CM | POA: Diagnosis not present

## 2015-03-17 DIAGNOSIS — D509 Iron deficiency anemia, unspecified: Secondary | ICD-10-CM | POA: Diagnosis not present

## 2015-03-17 DIAGNOSIS — N186 End stage renal disease: Secondary | ICD-10-CM | POA: Diagnosis not present

## 2015-03-17 DIAGNOSIS — N2581 Secondary hyperparathyroidism of renal origin: Secondary | ICD-10-CM | POA: Diagnosis not present

## 2015-03-18 DIAGNOSIS — D631 Anemia in chronic kidney disease: Secondary | ICD-10-CM | POA: Diagnosis not present

## 2015-03-18 DIAGNOSIS — Z992 Dependence on renal dialysis: Secondary | ICD-10-CM | POA: Diagnosis not present

## 2015-03-18 DIAGNOSIS — Z79899 Other long term (current) drug therapy: Secondary | ICD-10-CM | POA: Diagnosis not present

## 2015-03-18 DIAGNOSIS — E1122 Type 2 diabetes mellitus with diabetic chronic kidney disease: Secondary | ICD-10-CM | POA: Diagnosis not present

## 2015-03-18 DIAGNOSIS — Z9981 Dependence on supplemental oxygen: Secondary | ICD-10-CM | POA: Diagnosis not present

## 2015-03-18 DIAGNOSIS — J449 Chronic obstructive pulmonary disease, unspecified: Secondary | ICD-10-CM | POA: Diagnosis not present

## 2015-03-18 DIAGNOSIS — J9611 Chronic respiratory failure with hypoxia: Secondary | ICD-10-CM | POA: Diagnosis not present

## 2015-03-18 DIAGNOSIS — R569 Unspecified convulsions: Secondary | ICD-10-CM | POA: Diagnosis not present

## 2015-03-18 DIAGNOSIS — F4310 Post-traumatic stress disorder, unspecified: Secondary | ICD-10-CM | POA: Diagnosis not present

## 2015-03-18 DIAGNOSIS — F319 Bipolar disorder, unspecified: Secondary | ICD-10-CM | POA: Diagnosis not present

## 2015-03-18 DIAGNOSIS — I120 Hypertensive chronic kidney disease with stage 5 chronic kidney disease or end stage renal disease: Secondary | ICD-10-CM | POA: Diagnosis not present

## 2015-03-18 DIAGNOSIS — Z720 Tobacco use: Secondary | ICD-10-CM | POA: Diagnosis not present

## 2015-03-18 DIAGNOSIS — N186 End stage renal disease: Secondary | ICD-10-CM | POA: Diagnosis not present

## 2015-03-19 DIAGNOSIS — Z9981 Dependence on supplemental oxygen: Secondary | ICD-10-CM | POA: Diagnosis not present

## 2015-03-19 DIAGNOSIS — D631 Anemia in chronic kidney disease: Secondary | ICD-10-CM | POA: Diagnosis not present

## 2015-03-19 DIAGNOSIS — R52 Pain, unspecified: Secondary | ICD-10-CM | POA: Diagnosis not present

## 2015-03-19 DIAGNOSIS — D509 Iron deficiency anemia, unspecified: Secondary | ICD-10-CM | POA: Diagnosis not present

## 2015-03-19 DIAGNOSIS — J9611 Chronic respiratory failure with hypoxia: Secondary | ICD-10-CM | POA: Diagnosis not present

## 2015-03-19 DIAGNOSIS — Z992 Dependence on renal dialysis: Secondary | ICD-10-CM | POA: Diagnosis not present

## 2015-03-19 DIAGNOSIS — N2581 Secondary hyperparathyroidism of renal origin: Secondary | ICD-10-CM | POA: Diagnosis not present

## 2015-03-19 DIAGNOSIS — J449 Chronic obstructive pulmonary disease, unspecified: Secondary | ICD-10-CM | POA: Diagnosis not present

## 2015-03-19 DIAGNOSIS — Z79899 Other long term (current) drug therapy: Secondary | ICD-10-CM | POA: Diagnosis not present

## 2015-03-19 DIAGNOSIS — F4310 Post-traumatic stress disorder, unspecified: Secondary | ICD-10-CM | POA: Diagnosis not present

## 2015-03-19 DIAGNOSIS — F319 Bipolar disorder, unspecified: Secondary | ICD-10-CM | POA: Diagnosis not present

## 2015-03-19 DIAGNOSIS — N186 End stage renal disease: Secondary | ICD-10-CM | POA: Diagnosis not present

## 2015-03-19 DIAGNOSIS — I120 Hypertensive chronic kidney disease with stage 5 chronic kidney disease or end stage renal disease: Secondary | ICD-10-CM | POA: Diagnosis not present

## 2015-03-19 DIAGNOSIS — Z720 Tobacco use: Secondary | ICD-10-CM | POA: Diagnosis not present

## 2015-03-19 DIAGNOSIS — R569 Unspecified convulsions: Secondary | ICD-10-CM | POA: Diagnosis not present

## 2015-03-19 DIAGNOSIS — E1122 Type 2 diabetes mellitus with diabetic chronic kidney disease: Secondary | ICD-10-CM | POA: Diagnosis not present

## 2015-03-20 DIAGNOSIS — J449 Chronic obstructive pulmonary disease, unspecified: Secondary | ICD-10-CM | POA: Diagnosis not present

## 2015-03-20 DIAGNOSIS — R269 Unspecified abnormalities of gait and mobility: Secondary | ICD-10-CM | POA: Diagnosis not present

## 2015-03-23 DIAGNOSIS — Z9981 Dependence on supplemental oxygen: Secondary | ICD-10-CM | POA: Diagnosis not present

## 2015-03-23 DIAGNOSIS — E1122 Type 2 diabetes mellitus with diabetic chronic kidney disease: Secondary | ICD-10-CM | POA: Diagnosis not present

## 2015-03-23 DIAGNOSIS — N186 End stage renal disease: Secondary | ICD-10-CM | POA: Diagnosis not present

## 2015-03-23 DIAGNOSIS — F4310 Post-traumatic stress disorder, unspecified: Secondary | ICD-10-CM | POA: Diagnosis not present

## 2015-03-23 DIAGNOSIS — Z992 Dependence on renal dialysis: Secondary | ICD-10-CM | POA: Diagnosis not present

## 2015-03-23 DIAGNOSIS — I120 Hypertensive chronic kidney disease with stage 5 chronic kidney disease or end stage renal disease: Secondary | ICD-10-CM | POA: Diagnosis not present

## 2015-03-23 DIAGNOSIS — J449 Chronic obstructive pulmonary disease, unspecified: Secondary | ICD-10-CM | POA: Diagnosis not present

## 2015-03-23 DIAGNOSIS — Z79899 Other long term (current) drug therapy: Secondary | ICD-10-CM | POA: Diagnosis not present

## 2015-03-23 DIAGNOSIS — F319 Bipolar disorder, unspecified: Secondary | ICD-10-CM | POA: Diagnosis not present

## 2015-03-23 DIAGNOSIS — J9611 Chronic respiratory failure with hypoxia: Secondary | ICD-10-CM | POA: Diagnosis not present

## 2015-03-23 DIAGNOSIS — D631 Anemia in chronic kidney disease: Secondary | ICD-10-CM | POA: Diagnosis not present

## 2015-03-23 DIAGNOSIS — R569 Unspecified convulsions: Secondary | ICD-10-CM | POA: Diagnosis not present

## 2015-03-23 DIAGNOSIS — Z720 Tobacco use: Secondary | ICD-10-CM | POA: Diagnosis not present

## 2015-03-24 DIAGNOSIS — Z720 Tobacco use: Secondary | ICD-10-CM | POA: Diagnosis not present

## 2015-03-24 DIAGNOSIS — F319 Bipolar disorder, unspecified: Secondary | ICD-10-CM | POA: Diagnosis not present

## 2015-03-24 DIAGNOSIS — E1122 Type 2 diabetes mellitus with diabetic chronic kidney disease: Secondary | ICD-10-CM | POA: Diagnosis not present

## 2015-03-24 DIAGNOSIS — Z9981 Dependence on supplemental oxygen: Secondary | ICD-10-CM | POA: Diagnosis not present

## 2015-03-24 DIAGNOSIS — I120 Hypertensive chronic kidney disease with stage 5 chronic kidney disease or end stage renal disease: Secondary | ICD-10-CM | POA: Diagnosis not present

## 2015-03-24 DIAGNOSIS — J9611 Chronic respiratory failure with hypoxia: Secondary | ICD-10-CM | POA: Diagnosis not present

## 2015-03-24 DIAGNOSIS — R569 Unspecified convulsions: Secondary | ICD-10-CM | POA: Diagnosis not present

## 2015-03-24 DIAGNOSIS — N186 End stage renal disease: Secondary | ICD-10-CM | POA: Diagnosis not present

## 2015-03-24 DIAGNOSIS — Z992 Dependence on renal dialysis: Secondary | ICD-10-CM | POA: Diagnosis not present

## 2015-03-24 DIAGNOSIS — Z79899 Other long term (current) drug therapy: Secondary | ICD-10-CM | POA: Diagnosis not present

## 2015-03-24 DIAGNOSIS — J449 Chronic obstructive pulmonary disease, unspecified: Secondary | ICD-10-CM | POA: Diagnosis not present

## 2015-03-24 DIAGNOSIS — F4310 Post-traumatic stress disorder, unspecified: Secondary | ICD-10-CM | POA: Diagnosis not present

## 2015-03-24 DIAGNOSIS — D631 Anemia in chronic kidney disease: Secondary | ICD-10-CM | POA: Diagnosis not present

## 2015-03-25 DIAGNOSIS — Z720 Tobacco use: Secondary | ICD-10-CM | POA: Diagnosis not present

## 2015-03-25 DIAGNOSIS — I120 Hypertensive chronic kidney disease with stage 5 chronic kidney disease or end stage renal disease: Secondary | ICD-10-CM | POA: Diagnosis not present

## 2015-03-25 DIAGNOSIS — D631 Anemia in chronic kidney disease: Secondary | ICD-10-CM | POA: Diagnosis not present

## 2015-03-25 DIAGNOSIS — R569 Unspecified convulsions: Secondary | ICD-10-CM | POA: Diagnosis not present

## 2015-03-25 DIAGNOSIS — R52 Pain, unspecified: Secondary | ICD-10-CM | POA: Diagnosis not present

## 2015-03-25 DIAGNOSIS — N2581 Secondary hyperparathyroidism of renal origin: Secondary | ICD-10-CM | POA: Diagnosis not present

## 2015-03-25 DIAGNOSIS — E1122 Type 2 diabetes mellitus with diabetic chronic kidney disease: Secondary | ICD-10-CM | POA: Diagnosis not present

## 2015-03-25 DIAGNOSIS — J449 Chronic obstructive pulmonary disease, unspecified: Secondary | ICD-10-CM | POA: Diagnosis not present

## 2015-03-25 DIAGNOSIS — J9611 Chronic respiratory failure with hypoxia: Secondary | ICD-10-CM | POA: Diagnosis not present

## 2015-03-25 DIAGNOSIS — D509 Iron deficiency anemia, unspecified: Secondary | ICD-10-CM | POA: Diagnosis not present

## 2015-03-25 DIAGNOSIS — Z9981 Dependence on supplemental oxygen: Secondary | ICD-10-CM | POA: Diagnosis not present

## 2015-03-25 DIAGNOSIS — F4310 Post-traumatic stress disorder, unspecified: Secondary | ICD-10-CM | POA: Diagnosis not present

## 2015-03-25 DIAGNOSIS — Z79899 Other long term (current) drug therapy: Secondary | ICD-10-CM | POA: Diagnosis not present

## 2015-03-25 DIAGNOSIS — N186 End stage renal disease: Secondary | ICD-10-CM | POA: Diagnosis not present

## 2015-03-25 DIAGNOSIS — F319 Bipolar disorder, unspecified: Secondary | ICD-10-CM | POA: Diagnosis not present

## 2015-03-25 DIAGNOSIS — Z992 Dependence on renal dialysis: Secondary | ICD-10-CM | POA: Diagnosis not present

## 2015-03-28 DIAGNOSIS — Z720 Tobacco use: Secondary | ICD-10-CM | POA: Diagnosis not present

## 2015-03-28 DIAGNOSIS — E1122 Type 2 diabetes mellitus with diabetic chronic kidney disease: Secondary | ICD-10-CM | POA: Diagnosis not present

## 2015-03-28 DIAGNOSIS — Z9981 Dependence on supplemental oxygen: Secondary | ICD-10-CM | POA: Diagnosis not present

## 2015-03-28 DIAGNOSIS — I120 Hypertensive chronic kidney disease with stage 5 chronic kidney disease or end stage renal disease: Secondary | ICD-10-CM | POA: Diagnosis not present

## 2015-03-28 DIAGNOSIS — F319 Bipolar disorder, unspecified: Secondary | ICD-10-CM | POA: Diagnosis not present

## 2015-03-28 DIAGNOSIS — Z992 Dependence on renal dialysis: Secondary | ICD-10-CM | POA: Diagnosis not present

## 2015-03-28 DIAGNOSIS — R52 Pain, unspecified: Secondary | ICD-10-CM | POA: Diagnosis not present

## 2015-03-28 DIAGNOSIS — N186 End stage renal disease: Secondary | ICD-10-CM | POA: Diagnosis not present

## 2015-03-28 DIAGNOSIS — F4310 Post-traumatic stress disorder, unspecified: Secondary | ICD-10-CM | POA: Diagnosis not present

## 2015-03-28 DIAGNOSIS — J9611 Chronic respiratory failure with hypoxia: Secondary | ICD-10-CM | POA: Diagnosis not present

## 2015-03-28 DIAGNOSIS — J449 Chronic obstructive pulmonary disease, unspecified: Secondary | ICD-10-CM | POA: Diagnosis not present

## 2015-03-28 DIAGNOSIS — D509 Iron deficiency anemia, unspecified: Secondary | ICD-10-CM | POA: Diagnosis not present

## 2015-03-28 DIAGNOSIS — R569 Unspecified convulsions: Secondary | ICD-10-CM | POA: Diagnosis not present

## 2015-03-28 DIAGNOSIS — D631 Anemia in chronic kidney disease: Secondary | ICD-10-CM | POA: Diagnosis not present

## 2015-03-28 DIAGNOSIS — Z79899 Other long term (current) drug therapy: Secondary | ICD-10-CM | POA: Diagnosis not present

## 2015-03-28 DIAGNOSIS — N2581 Secondary hyperparathyroidism of renal origin: Secondary | ICD-10-CM | POA: Diagnosis not present

## 2015-03-29 DIAGNOSIS — J449 Chronic obstructive pulmonary disease, unspecified: Secondary | ICD-10-CM | POA: Diagnosis not present

## 2015-03-29 DIAGNOSIS — F319 Bipolar disorder, unspecified: Secondary | ICD-10-CM | POA: Diagnosis not present

## 2015-03-29 DIAGNOSIS — F4310 Post-traumatic stress disorder, unspecified: Secondary | ICD-10-CM | POA: Diagnosis not present

## 2015-03-29 DIAGNOSIS — Z992 Dependence on renal dialysis: Secondary | ICD-10-CM | POA: Diagnosis not present

## 2015-03-29 DIAGNOSIS — Z79899 Other long term (current) drug therapy: Secondary | ICD-10-CM | POA: Diagnosis not present

## 2015-03-29 DIAGNOSIS — D631 Anemia in chronic kidney disease: Secondary | ICD-10-CM | POA: Diagnosis not present

## 2015-03-29 DIAGNOSIS — Z9981 Dependence on supplemental oxygen: Secondary | ICD-10-CM | POA: Diagnosis not present

## 2015-03-29 DIAGNOSIS — R569 Unspecified convulsions: Secondary | ICD-10-CM | POA: Diagnosis not present

## 2015-03-29 DIAGNOSIS — I120 Hypertensive chronic kidney disease with stage 5 chronic kidney disease or end stage renal disease: Secondary | ICD-10-CM | POA: Diagnosis not present

## 2015-03-29 DIAGNOSIS — E1122 Type 2 diabetes mellitus with diabetic chronic kidney disease: Secondary | ICD-10-CM | POA: Diagnosis not present

## 2015-03-29 DIAGNOSIS — N186 End stage renal disease: Secondary | ICD-10-CM | POA: Diagnosis not present

## 2015-03-29 DIAGNOSIS — J9611 Chronic respiratory failure with hypoxia: Secondary | ICD-10-CM | POA: Diagnosis not present

## 2015-03-29 DIAGNOSIS — Z720 Tobacco use: Secondary | ICD-10-CM | POA: Diagnosis not present

## 2015-03-30 DIAGNOSIS — F319 Bipolar disorder, unspecified: Secondary | ICD-10-CM | POA: Diagnosis not present

## 2015-03-30 DIAGNOSIS — N186 End stage renal disease: Secondary | ICD-10-CM | POA: Diagnosis not present

## 2015-03-30 DIAGNOSIS — J9611 Chronic respiratory failure with hypoxia: Secondary | ICD-10-CM | POA: Diagnosis not present

## 2015-03-30 DIAGNOSIS — Z720 Tobacco use: Secondary | ICD-10-CM | POA: Diagnosis not present

## 2015-03-30 DIAGNOSIS — Z79899 Other long term (current) drug therapy: Secondary | ICD-10-CM | POA: Diagnosis not present

## 2015-03-30 DIAGNOSIS — F4310 Post-traumatic stress disorder, unspecified: Secondary | ICD-10-CM | POA: Diagnosis not present

## 2015-03-30 DIAGNOSIS — Z992 Dependence on renal dialysis: Secondary | ICD-10-CM | POA: Diagnosis not present

## 2015-03-30 DIAGNOSIS — E1122 Type 2 diabetes mellitus with diabetic chronic kidney disease: Secondary | ICD-10-CM | POA: Diagnosis not present

## 2015-03-30 DIAGNOSIS — R569 Unspecified convulsions: Secondary | ICD-10-CM | POA: Diagnosis not present

## 2015-03-30 DIAGNOSIS — I120 Hypertensive chronic kidney disease with stage 5 chronic kidney disease or end stage renal disease: Secondary | ICD-10-CM | POA: Diagnosis not present

## 2015-03-30 DIAGNOSIS — D631 Anemia in chronic kidney disease: Secondary | ICD-10-CM | POA: Diagnosis not present

## 2015-03-30 DIAGNOSIS — J449 Chronic obstructive pulmonary disease, unspecified: Secondary | ICD-10-CM | POA: Diagnosis not present

## 2015-03-30 DIAGNOSIS — Z9981 Dependence on supplemental oxygen: Secondary | ICD-10-CM | POA: Diagnosis not present

## 2015-03-31 DIAGNOSIS — N186 End stage renal disease: Secondary | ICD-10-CM | POA: Diagnosis not present

## 2015-03-31 DIAGNOSIS — J449 Chronic obstructive pulmonary disease, unspecified: Secondary | ICD-10-CM | POA: Diagnosis not present

## 2015-03-31 DIAGNOSIS — Z79899 Other long term (current) drug therapy: Secondary | ICD-10-CM | POA: Diagnosis not present

## 2015-03-31 DIAGNOSIS — I120 Hypertensive chronic kidney disease with stage 5 chronic kidney disease or end stage renal disease: Secondary | ICD-10-CM | POA: Diagnosis not present

## 2015-03-31 DIAGNOSIS — R569 Unspecified convulsions: Secondary | ICD-10-CM | POA: Diagnosis not present

## 2015-03-31 DIAGNOSIS — Z992 Dependence on renal dialysis: Secondary | ICD-10-CM | POA: Diagnosis not present

## 2015-03-31 DIAGNOSIS — D631 Anemia in chronic kidney disease: Secondary | ICD-10-CM | POA: Diagnosis not present

## 2015-03-31 DIAGNOSIS — F4310 Post-traumatic stress disorder, unspecified: Secondary | ICD-10-CM | POA: Diagnosis not present

## 2015-03-31 DIAGNOSIS — F319 Bipolar disorder, unspecified: Secondary | ICD-10-CM | POA: Diagnosis not present

## 2015-03-31 DIAGNOSIS — Z9981 Dependence on supplemental oxygen: Secondary | ICD-10-CM | POA: Diagnosis not present

## 2015-03-31 DIAGNOSIS — Z720 Tobacco use: Secondary | ICD-10-CM | POA: Diagnosis not present

## 2015-03-31 DIAGNOSIS — J9611 Chronic respiratory failure with hypoxia: Secondary | ICD-10-CM | POA: Diagnosis not present

## 2015-03-31 DIAGNOSIS — E1122 Type 2 diabetes mellitus with diabetic chronic kidney disease: Secondary | ICD-10-CM | POA: Diagnosis not present

## 2015-04-01 DIAGNOSIS — J449 Chronic obstructive pulmonary disease, unspecified: Secondary | ICD-10-CM | POA: Diagnosis not present

## 2015-04-01 DIAGNOSIS — I120 Hypertensive chronic kidney disease with stage 5 chronic kidney disease or end stage renal disease: Secondary | ICD-10-CM | POA: Diagnosis not present

## 2015-04-01 DIAGNOSIS — Z720 Tobacco use: Secondary | ICD-10-CM | POA: Diagnosis not present

## 2015-04-01 DIAGNOSIS — D631 Anemia in chronic kidney disease: Secondary | ICD-10-CM | POA: Diagnosis not present

## 2015-04-01 DIAGNOSIS — J9611 Chronic respiratory failure with hypoxia: Secondary | ICD-10-CM | POA: Diagnosis not present

## 2015-04-01 DIAGNOSIS — F319 Bipolar disorder, unspecified: Secondary | ICD-10-CM | POA: Diagnosis not present

## 2015-04-01 DIAGNOSIS — Z992 Dependence on renal dialysis: Secondary | ICD-10-CM | POA: Diagnosis not present

## 2015-04-01 DIAGNOSIS — E1122 Type 2 diabetes mellitus with diabetic chronic kidney disease: Secondary | ICD-10-CM | POA: Diagnosis not present

## 2015-04-01 DIAGNOSIS — F4310 Post-traumatic stress disorder, unspecified: Secondary | ICD-10-CM | POA: Diagnosis not present

## 2015-04-01 DIAGNOSIS — N186 End stage renal disease: Secondary | ICD-10-CM | POA: Diagnosis not present

## 2015-04-01 DIAGNOSIS — Z9981 Dependence on supplemental oxygen: Secondary | ICD-10-CM | POA: Diagnosis not present

## 2015-04-01 DIAGNOSIS — R569 Unspecified convulsions: Secondary | ICD-10-CM | POA: Diagnosis not present

## 2015-04-01 DIAGNOSIS — Z79899 Other long term (current) drug therapy: Secondary | ICD-10-CM | POA: Diagnosis not present

## 2015-04-05 DIAGNOSIS — R52 Pain, unspecified: Secondary | ICD-10-CM | POA: Diagnosis not present

## 2015-04-05 DIAGNOSIS — D509 Iron deficiency anemia, unspecified: Secondary | ICD-10-CM | POA: Diagnosis not present

## 2015-04-05 DIAGNOSIS — N186 End stage renal disease: Secondary | ICD-10-CM | POA: Diagnosis not present

## 2015-04-05 DIAGNOSIS — D631 Anemia in chronic kidney disease: Secondary | ICD-10-CM | POA: Diagnosis not present

## 2015-04-05 DIAGNOSIS — N2581 Secondary hyperparathyroidism of renal origin: Secondary | ICD-10-CM | POA: Diagnosis not present

## 2015-04-06 DIAGNOSIS — F319 Bipolar disorder, unspecified: Secondary | ICD-10-CM | POA: Diagnosis not present

## 2015-04-06 DIAGNOSIS — F4310 Post-traumatic stress disorder, unspecified: Secondary | ICD-10-CM | POA: Diagnosis not present

## 2015-04-06 DIAGNOSIS — Z9981 Dependence on supplemental oxygen: Secondary | ICD-10-CM | POA: Diagnosis not present

## 2015-04-06 DIAGNOSIS — N186 End stage renal disease: Secondary | ICD-10-CM | POA: Diagnosis not present

## 2015-04-06 DIAGNOSIS — J449 Chronic obstructive pulmonary disease, unspecified: Secondary | ICD-10-CM | POA: Diagnosis not present

## 2015-04-06 DIAGNOSIS — D631 Anemia in chronic kidney disease: Secondary | ICD-10-CM | POA: Diagnosis not present

## 2015-04-06 DIAGNOSIS — J9611 Chronic respiratory failure with hypoxia: Secondary | ICD-10-CM | POA: Diagnosis not present

## 2015-04-06 DIAGNOSIS — E1122 Type 2 diabetes mellitus with diabetic chronic kidney disease: Secondary | ICD-10-CM | POA: Diagnosis not present

## 2015-04-06 DIAGNOSIS — I120 Hypertensive chronic kidney disease with stage 5 chronic kidney disease or end stage renal disease: Secondary | ICD-10-CM | POA: Diagnosis not present

## 2015-04-06 DIAGNOSIS — R569 Unspecified convulsions: Secondary | ICD-10-CM | POA: Diagnosis not present

## 2015-04-06 DIAGNOSIS — Z992 Dependence on renal dialysis: Secondary | ICD-10-CM | POA: Diagnosis not present

## 2015-04-06 DIAGNOSIS — Z79899 Other long term (current) drug therapy: Secondary | ICD-10-CM | POA: Diagnosis not present

## 2015-04-06 DIAGNOSIS — Z720 Tobacco use: Secondary | ICD-10-CM | POA: Diagnosis not present

## 2015-04-08 DIAGNOSIS — Z992 Dependence on renal dialysis: Secondary | ICD-10-CM | POA: Diagnosis not present

## 2015-04-08 DIAGNOSIS — F4310 Post-traumatic stress disorder, unspecified: Secondary | ICD-10-CM | POA: Diagnosis not present

## 2015-04-08 DIAGNOSIS — I129 Hypertensive chronic kidney disease with stage 1 through stage 4 chronic kidney disease, or unspecified chronic kidney disease: Secondary | ICD-10-CM | POA: Diagnosis not present

## 2015-04-08 DIAGNOSIS — N186 End stage renal disease: Secondary | ICD-10-CM | POA: Diagnosis not present

## 2015-04-08 DIAGNOSIS — Z79899 Other long term (current) drug therapy: Secondary | ICD-10-CM | POA: Diagnosis not present

## 2015-04-08 DIAGNOSIS — Z720 Tobacco use: Secondary | ICD-10-CM | POA: Diagnosis not present

## 2015-04-08 DIAGNOSIS — I120 Hypertensive chronic kidney disease with stage 5 chronic kidney disease or end stage renal disease: Secondary | ICD-10-CM | POA: Diagnosis not present

## 2015-04-08 DIAGNOSIS — Z9981 Dependence on supplemental oxygen: Secondary | ICD-10-CM | POA: Diagnosis not present

## 2015-04-08 DIAGNOSIS — F319 Bipolar disorder, unspecified: Secondary | ICD-10-CM | POA: Diagnosis not present

## 2015-04-08 DIAGNOSIS — R569 Unspecified convulsions: Secondary | ICD-10-CM | POA: Diagnosis not present

## 2015-04-08 DIAGNOSIS — J9611 Chronic respiratory failure with hypoxia: Secondary | ICD-10-CM | POA: Diagnosis not present

## 2015-04-08 DIAGNOSIS — E1122 Type 2 diabetes mellitus with diabetic chronic kidney disease: Secondary | ICD-10-CM | POA: Diagnosis not present

## 2015-04-08 DIAGNOSIS — D631 Anemia in chronic kidney disease: Secondary | ICD-10-CM | POA: Diagnosis not present

## 2015-04-08 DIAGNOSIS — J449 Chronic obstructive pulmonary disease, unspecified: Secondary | ICD-10-CM | POA: Diagnosis not present

## 2015-04-10 DIAGNOSIS — F319 Bipolar disorder, unspecified: Secondary | ICD-10-CM | POA: Diagnosis not present

## 2015-04-10 DIAGNOSIS — I120 Hypertensive chronic kidney disease with stage 5 chronic kidney disease or end stage renal disease: Secondary | ICD-10-CM | POA: Diagnosis not present

## 2015-04-10 DIAGNOSIS — N186 End stage renal disease: Secondary | ICD-10-CM | POA: Diagnosis not present

## 2015-04-10 DIAGNOSIS — F4310 Post-traumatic stress disorder, unspecified: Secondary | ICD-10-CM | POA: Diagnosis not present

## 2015-04-10 DIAGNOSIS — J9611 Chronic respiratory failure with hypoxia: Secondary | ICD-10-CM | POA: Diagnosis not present

## 2015-04-10 DIAGNOSIS — Z9981 Dependence on supplemental oxygen: Secondary | ICD-10-CM | POA: Diagnosis not present

## 2015-04-10 DIAGNOSIS — Z992 Dependence on renal dialysis: Secondary | ICD-10-CM | POA: Diagnosis not present

## 2015-04-10 DIAGNOSIS — J449 Chronic obstructive pulmonary disease, unspecified: Secondary | ICD-10-CM | POA: Diagnosis not present

## 2015-04-10 DIAGNOSIS — Z79899 Other long term (current) drug therapy: Secondary | ICD-10-CM | POA: Diagnosis not present

## 2015-04-10 DIAGNOSIS — R569 Unspecified convulsions: Secondary | ICD-10-CM | POA: Diagnosis not present

## 2015-04-10 DIAGNOSIS — Z720 Tobacco use: Secondary | ICD-10-CM | POA: Diagnosis not present

## 2015-04-10 DIAGNOSIS — E1122 Type 2 diabetes mellitus with diabetic chronic kidney disease: Secondary | ICD-10-CM | POA: Diagnosis not present

## 2015-04-10 DIAGNOSIS — D631 Anemia in chronic kidney disease: Secondary | ICD-10-CM | POA: Diagnosis not present

## 2015-04-11 DIAGNOSIS — N2581 Secondary hyperparathyroidism of renal origin: Secondary | ICD-10-CM | POA: Diagnosis not present

## 2015-04-11 DIAGNOSIS — D631 Anemia in chronic kidney disease: Secondary | ICD-10-CM | POA: Diagnosis not present

## 2015-04-11 DIAGNOSIS — D509 Iron deficiency anemia, unspecified: Secondary | ICD-10-CM | POA: Diagnosis not present

## 2015-04-11 DIAGNOSIS — R52 Pain, unspecified: Secondary | ICD-10-CM | POA: Diagnosis not present

## 2015-04-11 DIAGNOSIS — N186 End stage renal disease: Secondary | ICD-10-CM | POA: Diagnosis not present

## 2015-04-13 DIAGNOSIS — Z79899 Other long term (current) drug therapy: Secondary | ICD-10-CM | POA: Diagnosis not present

## 2015-04-13 DIAGNOSIS — F4310 Post-traumatic stress disorder, unspecified: Secondary | ICD-10-CM | POA: Diagnosis not present

## 2015-04-13 DIAGNOSIS — I120 Hypertensive chronic kidney disease with stage 5 chronic kidney disease or end stage renal disease: Secondary | ICD-10-CM | POA: Diagnosis not present

## 2015-04-13 DIAGNOSIS — R569 Unspecified convulsions: Secondary | ICD-10-CM | POA: Diagnosis not present

## 2015-04-13 DIAGNOSIS — F319 Bipolar disorder, unspecified: Secondary | ICD-10-CM | POA: Diagnosis not present

## 2015-04-13 DIAGNOSIS — N186 End stage renal disease: Secondary | ICD-10-CM | POA: Diagnosis not present

## 2015-04-13 DIAGNOSIS — Z720 Tobacco use: Secondary | ICD-10-CM | POA: Diagnosis not present

## 2015-04-13 DIAGNOSIS — E1122 Type 2 diabetes mellitus with diabetic chronic kidney disease: Secondary | ICD-10-CM | POA: Diagnosis not present

## 2015-04-13 DIAGNOSIS — Z9981 Dependence on supplemental oxygen: Secondary | ICD-10-CM | POA: Diagnosis not present

## 2015-04-13 DIAGNOSIS — Z992 Dependence on renal dialysis: Secondary | ICD-10-CM | POA: Diagnosis not present

## 2015-04-13 DIAGNOSIS — D631 Anemia in chronic kidney disease: Secondary | ICD-10-CM | POA: Diagnosis not present

## 2015-04-13 DIAGNOSIS — J9611 Chronic respiratory failure with hypoxia: Secondary | ICD-10-CM | POA: Diagnosis not present

## 2015-04-13 DIAGNOSIS — J449 Chronic obstructive pulmonary disease, unspecified: Secondary | ICD-10-CM | POA: Diagnosis not present

## 2015-04-15 DIAGNOSIS — F319 Bipolar disorder, unspecified: Secondary | ICD-10-CM | POA: Diagnosis not present

## 2015-04-15 DIAGNOSIS — N186 End stage renal disease: Secondary | ICD-10-CM | POA: Diagnosis not present

## 2015-04-15 DIAGNOSIS — Z79899 Other long term (current) drug therapy: Secondary | ICD-10-CM | POA: Diagnosis not present

## 2015-04-15 DIAGNOSIS — R569 Unspecified convulsions: Secondary | ICD-10-CM | POA: Diagnosis not present

## 2015-04-15 DIAGNOSIS — I120 Hypertensive chronic kidney disease with stage 5 chronic kidney disease or end stage renal disease: Secondary | ICD-10-CM | POA: Diagnosis not present

## 2015-04-15 DIAGNOSIS — E1122 Type 2 diabetes mellitus with diabetic chronic kidney disease: Secondary | ICD-10-CM | POA: Diagnosis not present

## 2015-04-15 DIAGNOSIS — J449 Chronic obstructive pulmonary disease, unspecified: Secondary | ICD-10-CM | POA: Diagnosis not present

## 2015-04-15 DIAGNOSIS — Z992 Dependence on renal dialysis: Secondary | ICD-10-CM | POA: Diagnosis not present

## 2015-04-15 DIAGNOSIS — J9611 Chronic respiratory failure with hypoxia: Secondary | ICD-10-CM | POA: Diagnosis not present

## 2015-04-15 DIAGNOSIS — D631 Anemia in chronic kidney disease: Secondary | ICD-10-CM | POA: Diagnosis not present

## 2015-04-15 DIAGNOSIS — Z720 Tobacco use: Secondary | ICD-10-CM | POA: Diagnosis not present

## 2015-04-15 DIAGNOSIS — F4310 Post-traumatic stress disorder, unspecified: Secondary | ICD-10-CM | POA: Diagnosis not present

## 2015-04-15 DIAGNOSIS — Z9981 Dependence on supplemental oxygen: Secondary | ICD-10-CM | POA: Diagnosis not present

## 2015-04-16 ENCOUNTER — Ambulatory Visit (HOSPITAL_COMMUNITY)
Admission: RE | Admit: 2015-04-16 | Discharge: 2015-04-16 | Disposition: A | Discharge status: Home / Self Care | Payer: Medicare Other | Source: Other Acute Inpatient Hospital | Attending: Nephrology | Admitting: Nephrology

## 2015-04-16 DIAGNOSIS — F4310 Post-traumatic stress disorder, unspecified: Secondary | ICD-10-CM | POA: Diagnosis not present

## 2015-04-16 DIAGNOSIS — Z9981 Dependence on supplemental oxygen: Secondary | ICD-10-CM | POA: Diagnosis not present

## 2015-04-16 DIAGNOSIS — Z992 Dependence on renal dialysis: Secondary | ICD-10-CM | POA: Diagnosis not present

## 2015-04-16 DIAGNOSIS — J449 Chronic obstructive pulmonary disease, unspecified: Secondary | ICD-10-CM | POA: Insufficient documentation

## 2015-04-16 DIAGNOSIS — I517 Cardiomegaly: Secondary | ICD-10-CM | POA: Diagnosis not present

## 2015-04-16 DIAGNOSIS — R0602 Shortness of breath: Secondary | ICD-10-CM | POA: Diagnosis not present

## 2015-04-16 DIAGNOSIS — Z9115 Patient's noncompliance with renal dialysis: Secondary | ICD-10-CM | POA: Insufficient documentation

## 2015-04-16 DIAGNOSIS — Z885 Allergy status to narcotic agent status: Secondary | ICD-10-CM | POA: Diagnosis not present

## 2015-04-16 DIAGNOSIS — Z9889 Other specified postprocedural states: Secondary | ICD-10-CM | POA: Diagnosis not present

## 2015-04-16 DIAGNOSIS — I129 Hypertensive chronic kidney disease with stage 1 through stage 4 chronic kidney disease, or unspecified chronic kidney disease: Secondary | ICD-10-CM | POA: Diagnosis not present

## 2015-04-16 DIAGNOSIS — Z888 Allergy status to other drugs, medicaments and biological substances status: Secondary | ICD-10-CM | POA: Diagnosis not present

## 2015-04-16 DIAGNOSIS — J45909 Unspecified asthma, uncomplicated: Secondary | ICD-10-CM | POA: Insufficient documentation

## 2015-04-16 DIAGNOSIS — F319 Bipolar disorder, unspecified: Secondary | ICD-10-CM | POA: Diagnosis not present

## 2015-04-16 DIAGNOSIS — E1122 Type 2 diabetes mellitus with diabetic chronic kidney disease: Secondary | ICD-10-CM | POA: Diagnosis not present

## 2015-04-16 DIAGNOSIS — D631 Anemia in chronic kidney disease: Secondary | ICD-10-CM | POA: Diagnosis not present

## 2015-04-16 DIAGNOSIS — E785 Hyperlipidemia, unspecified: Secondary | ICD-10-CM | POA: Diagnosis not present

## 2015-04-16 DIAGNOSIS — M545 Low back pain: Secondary | ICD-10-CM | POA: Diagnosis not present

## 2015-04-16 DIAGNOSIS — K219 Gastro-esophageal reflux disease without esophagitis: Secondary | ICD-10-CM | POA: Insufficient documentation

## 2015-04-16 DIAGNOSIS — E875 Hyperkalemia: Secondary | ICD-10-CM | POA: Insufficient documentation

## 2015-04-16 DIAGNOSIS — R9431 Abnormal electrocardiogram [ECG] [EKG]: Secondary | ICD-10-CM | POA: Diagnosis not present

## 2015-04-16 DIAGNOSIS — I120 Hypertensive chronic kidney disease with stage 5 chronic kidney disease or end stage renal disease: Secondary | ICD-10-CM | POA: Insufficient documentation

## 2015-04-16 DIAGNOSIS — N186 End stage renal disease: Secondary | ICD-10-CM | POA: Insufficient documentation

## 2015-04-16 DIAGNOSIS — J9811 Atelectasis: Secondary | ICD-10-CM | POA: Diagnosis not present

## 2015-04-16 DIAGNOSIS — Z79899 Other long term (current) drug therapy: Secondary | ICD-10-CM | POA: Diagnosis not present

## 2015-04-16 DIAGNOSIS — R1030 Lower abdominal pain, unspecified: Secondary | ICD-10-CM | POA: Insufficient documentation

## 2015-04-16 DIAGNOSIS — Z720 Tobacco use: Secondary | ICD-10-CM | POA: Diagnosis not present

## 2015-04-16 DIAGNOSIS — R569 Unspecified convulsions: Secondary | ICD-10-CM | POA: Diagnosis not present

## 2015-04-16 DIAGNOSIS — F17210 Nicotine dependence, cigarettes, uncomplicated: Secondary | ICD-10-CM | POA: Diagnosis not present

## 2015-04-16 DIAGNOSIS — J9611 Chronic respiratory failure with hypoxia: Secondary | ICD-10-CM | POA: Diagnosis not present

## 2015-04-16 LAB — POTASSIUM: Potassium: 6 mmol/L — ABNORMAL HIGH (ref 3.5–5.1)

## 2015-04-16 MED ORDER — LIDOCAINE HCL (PF) 1 % IJ SOLN: 5.0000 mL | INTRAMUSCULAR | Status: DC | PRN

## 2015-04-16 MED ORDER — SODIUM CHLORIDE 0.9 % IV SOLN: 100.0000 mL | INTRAVENOUS | Status: DC | PRN

## 2015-04-16 MED ORDER — ALTEPLASE 2 MG IJ SOLR: 2.0000 mg | Freq: Once | INTRAMUSCULAR | Status: DC | PRN

## 2015-04-16 MED ORDER — ACETAMINOPHEN 325 MG PO TABS
650.0000 mg | ORAL_TABLET | Freq: Four times a day (QID) | ORAL | Status: DC | PRN
  Administered 2015-04-16: 650 mg via ORAL

## 2015-04-16 MED ORDER — LIDOCAINE-PRILOCAINE 2.5-2.5 % EX CREA: 1.0000 "application " | TOPICAL_CREAM | CUTANEOUS | Status: DC | PRN

## 2015-04-16 MED ORDER — PENTAFLUOROPROP-TETRAFLUOROETH EX AERO: 1.0000 "application " | INHALATION_SPRAY | CUTANEOUS | Status: DC | PRN

## 2015-04-16 MED ORDER — HEPARIN SODIUM (PORCINE) 1000 UNIT/ML DIALYSIS: 1000.0000 [IU] | INTRAMUSCULAR | Status: DC | PRN

## 2015-04-16 MED ORDER — HEPARIN SODIUM (PORCINE) 1000 UNIT/ML DIALYSIS: 20.0000 [IU]/kg | INTRAMUSCULAR | Status: DC | PRN

## 2015-04-16 MED ORDER — ACETAMINOPHEN 325 MG PO TABS
ORAL_TABLET | ORAL | Status: AC
  Administered 2015-04-16: 650 mg via ORAL
  Filled 2015-04-16: qty 2

## 2015-04-16 NOTE — Progress Notes (Signed)
Dialysate changed from 1K to 2K per physician order.  Will continue to monitor.

## 2015-04-16 NOTE — H&P (Signed)
Short History and Physical Form [facility]  04/16/2015,8:37 PM    HPI:   Jennifer Lawson is an 54 y.o. female with PMhx significant for HTN, asthma, Psychiatric d/o NOS and ESRD- HD at [facility] on TTS but appears to be noncompliant.  I reviewed the record and it seems that she did not come for HD last week on TT but did come Saturday.  She then missed Tuesday and today.  The history is unclear of why she presented for medical attention today.  There is a story that she had a home health nurse come to see her and told her to go to the hospital because her blood pressure was high.  However, she says that she presented to the ER at the [facility] to address lower abdominal pain that she has had for a year.  The ER at [facility] told me that they did a brief work up for this abdominal pain which was negative but when they did labs her K was 7.4.  She was given calcium, insulin and D50 and transported here for HD.  She looks well and even though is c/o pain- in the next sentence asks for something to eat.  My plan is to do HD to bring her K down and then send her home- she is agreeable.  She tells me she will not miss dialysis anymore and will present for her regualr treatment on Saturday.     Dialyzes at [facility] on TTS. EDW ? Marland Kitchen  Past Medical History  Diagnosis Date  . Mental disorder   . Depression   . Hypertension   . Overdose   . Tobacco use disorder 11/13/2012  . Complication of anesthesia     difficulty going to sleep  . Chronic kidney disease     06/11/13- not on dialysis  . Shortness of breath     lying down flat  . PTSD (post-traumatic stress disorder)   . Asthma   . COPD (chronic obstructive pulmonary disease)   . Heart murmur   . GERD (gastroesophageal reflux disease)   . Seizures     "passsed out"  . History of blood transfusion   . Diabetes mellitus without complication     denies   Allergies  Allergen Reactions  . Codeine Sulfate Anaphylaxis   Daughter called about having this allergy   . Depakote [Divalproex Sodium] Other (See Comments)    Nose bleeds  . Gabapentin Other (See Comments)    seizures  . Haldol [Haloperidol Lactate] Shortness Of Breath  . Risperidone And Related Shortness Of Breath  . Trazodone And Nefazodone Other (See Comments)    Makes pt lose balance and fall  . Invega [Paliperidone Er] Nausea And Vomiting  . Vistaril [Hydroxyzine Hcl] Nausea And Vomiting   Prior to Admission medications   Medication Sig Start Date End Date Taking? Authorizing Provider  acetaminophen (TYLENOL) 325 MG tablet Take 2 tablets (650 mg total) by mouth every 4 (four) hours as needed for fever, headache or mild pain. 05/20/14   Srikar Janna Arch, MD  albuterol (PROVENTIL HFA;VENTOLIN HFA) 108 (90 BASE) MCG/ACT inhaler Inhale 2 puffs into the lungs every 6 (six) hours as needed for wheezing or shortness of breath. 01/13/15   Clovis Fredrickson, MD  amoxicillin-clavulanate (AUGMENTIN) 500-125 MG per tablet Take 1 tablet (500 mg total) by mouth daily. 01/29/15   Verlee Monte, MD  Artificial Saliva (BIOTENE ORALBALANCE DRY MOUTH) LIQD Rinse your mouth every 6 hours. 01/29/15  Verlee Monte, MD  bisacodyl (DULCOLAX) 5 MG EC tablet Take 1 tablet (5 mg total) by mouth daily as needed for moderate constipation. Patient not taking: Reported on 10/13/2014 05/20/14   Bynum Bellows, MD  cinacalcet (SENSIPAR) 60 MG tablet Take 60 mg by mouth daily.    Historical Provider, MD  Darbepoetin Alfa (ARANESP) 60 MCG/0.3ML SOSY injection Inject 0.3 mLs (60 mcg total) into the vein every Thursday with hemodialysis. 05/20/14   Bynum Bellows, MD  divalproex (DEPAKOTE ER) 250 MG 24 hr tablet Take 500 mg by mouth 2 (two) times daily. 01/19/15   Historical Provider, MD  docusate sodium 100 MG CAPS Take 100 mg by mouth 2 (two) times daily. Patient not taking: Reported on 10/13/2014 05/20/14   Bynum Bellows, MD  doxepin (SINEQUAN) 25 MG capsule Take 25 mg by mouth at bedtime.  09/12/14   Historical Provider, MD  ferric gluconate 250 mg in sodium chloride 0.9 % 100 mL Inject 250 mg into the vein every Tuesday, Thursday, Saturday, and Sunday. 05/20/14   Srikar Janna Arch, MD  ibuprofen (ADVIL,MOTRIN) 200 MG tablet Take 600 mg by mouth every 6 (six) hours as needed for mild pain, moderate pain or cramping.    Historical Provider, MD  LORazepam (ATIVAN) 0.5 MG tablet Take 0.5 mg by mouth daily as needed for anxiety.    Historical Provider, MD  metoprolol tartrate (LOPRESSOR) 25 MG tablet Take 0.5 tablets (12.5 mg total) by mouth 2 (two) times daily. 01/13/15   Clovis Fredrickson, MD  mometasone-formoterol (DULERA) 100-5 MCG/ACT AERO Inhale 2 puffs into the lungs 2 (two) times daily. 01/13/15   Clovis Fredrickson, MD  Nutritional Supplements (FEEDING SUPPLEMENT, NEPRO CARB STEADY,) LIQD Take 237 mLs by mouth as needed (missed meal during dialysis.). Patient not taking: Reported on 10/13/2014 05/20/14   Bynum Bellows, MD  pantoprazole (PROTONIX) 40 MG tablet Take 1 tablet (40 mg total) by mouth daily. Patient not taking: Reported on 10/13/2014 08/01/14   Delfina Redwood, MD  simvastatin (ZOCOR) 10 MG tablet Take 10 mg by mouth at bedtime. 01/17/15   Historical Provider, MD  ziprasidone (GEODON) 60 MG capsule Take 1 capsule (60 mg total) by mouth 2 (two) times daily with a meal. For mood control 01/13/15   Jolanta B Pucilowska, MD   No results found for this or any previous visit (from the past 48 hour(s)). No results found.  Physical Exam: There were no vitals filed for this visit. General: alert, NAD but c/o lower abdominal pain Heart: RRR Lungs: some exp wheezes Abdomen: soft, non tender Extremities: trace edema Access: right upper arm AVF- good thrill and bruit  Assessment/Plan: 54 year old BF with ESRD but notably noncompliant with tx presents with hyperkalemia 1 Hyperkalemia- HD tonight to bring down 2 ESRD: plan is for discharge after HD and pt promises will present for  her regular HD at [facility] on Saturday 3. Abdominal pain- present for a year ? Abdominal exam is completely negative- is allergic to all pain meds- will give tylenol    Dalasia Predmore A  04/16/2015, 8:37 PM

## 2015-04-16 NOTE — Progress Notes (Signed)
Dialysate changed from 0K+ to 1K+ per physician telephone order.  Will continue to monitor.

## 2015-04-16 NOTE — Progress Notes (Signed)
Pt arrived to unit per EMS at 2019.  Report received from [place] from [facility].  Consent obtained.  A & O X 4  Lungs diminished.  Regular R&R  Generalized edema.  RUAVF cannulated with 15 gauge needles.  Pulsation of blood noted.  Labs drawn and access flushed with NS.  Tx initiated at 2036 with goal of 4522mL and net fluid removal of 4L.  Will continue to monitor.

## 2015-04-16 NOTE — Discharge Summary (Signed)
Physician Discharge Summary  Patient ID: Jennifer Lawson MRN: [id number] DOB/AGE: [birth year] 55 y.o.  Admit date: 04/16/2015 Discharge date: 04/16/2015  Admission Diagnoses: Hyperkalemia  Discharge Diagnoses: same Active Problems:   * No active hospital problems. *   Discharged Condition: fair  Hospital Course: Patient is a noncompliant HD patient - she missed HD at OP center on Tuesday and today- now has hyperkalemia requiring emergent HD this evening  Consults: None  Significant Diagnostic Studies: labs: potassium of over 7  Treatments: dialysis: Hemodialysis  Discharge Exam: Blood pressure 120/79, pulse 84, temperature 98 F (36.7 C), temperature source Oral, resp. rate 20, weight 86.8 kg (191 lb 5.8 oz), last menstrual period 05/09/2008. General appearance: alert and distracted Resp: clear to auscultation bilaterally Cardio: regular rate and rhythm, S1, S2 normal, no murmur, click, rub or gallop GI: soft, non-tender; bowel sounds normal; no masses,  no organomegaly Extremities: edema trace  Disposition: 01-Home or Self Care     Medication List    ASK your doctor about these medications        acetaminophen 325 MG tablet  Commonly known as:  TYLENOL  Take 2 tablets (650 mg total) by mouth every 4 (four) hours as needed for fever, headache or mild pain.     albuterol 108 (90 BASE) MCG/ACT inhaler  Commonly known as:  PROVENTIL HFA;VENTOLIN HFA  Inhale 2 puffs into the lungs every 6 (six) hours as needed for wheezing or shortness of breath.     amoxicillin-clavulanate 500-125 MG tablet  Commonly known as:  AUGMENTIN  Take 1 tablet (500 mg total) by mouth daily.     BIOTENE ORALBALANCE DRY MOUTH Liqd  Rinse your mouth every 6 hours.     bisacodyl 5 MG EC tablet  Commonly known as:  DULCOLAX  Take 1 tablet (5 mg total) by mouth daily as needed for moderate constipation.     cinacalcet 60 MG tablet  Commonly known as:  SENSIPAR  Take 60 mg by mouth daily.      Darbepoetin Alfa 60 MCG/0.3ML Sosy injection  Commonly known as:  ARANESP  Inject 0.3 mLs (60 mcg total) into the vein every Thursday with hemodialysis.     divalproex 250 MG 24 hr tablet  Commonly known as:  DEPAKOTE ER  Take 500 mg by mouth 2 (two) times daily.     doxepin 25 MG capsule  Commonly known as:  SINEQUAN  Take 25 mg by mouth at bedtime.     DSS 100 MG Caps  Take 100 mg by mouth 2 (two) times daily.     feeding supplement (NEPRO CARB STEADY) Liqd  Take 237 mLs by mouth as needed (missed meal during dialysis.).     ferric gluconate 250 mg in sodium chloride 0.9 % 100 mL  Inject 250 mg into the vein every Tuesday, Thursday, Saturday, and Sunday.     ibuprofen 200 MG tablet  Commonly known as:  ADVIL,MOTRIN  Take 600 mg by mouth every 6 (six) hours as needed for mild pain, moderate pain or cramping.     LORazepam 0.5 MG tablet  Commonly known as:  ATIVAN  Take 0.5 mg by mouth daily as needed for anxiety.     metoprolol tartrate 25 MG tablet  Commonly known as:  LOPRESSOR  Take 0.5 tablets (12.5 mg total) by mouth 2 (two) times daily.     mometasone-formoterol 100-5 MCG/ACT Aero  Commonly known as:  DULERA  Inhale 2 puffs into the lungs 2 (  two) times daily.     pantoprazole 40 MG tablet  Commonly known as:  PROTONIX  Take 1 tablet (40 mg total) by mouth daily.     simvastatin 10 MG tablet  Commonly known as:  ZOCOR  Take 10 mg by mouth at bedtime.     ziprasidone 60 MG capsule  Commonly known as:  GEODON  Take 1 capsule (60 mg total) by mouth 2 (two) times daily with a meal. For mood control           Follow-up Information    Follow up with [facility] On 04/18/2015.   Why:  present for regular dialysis session   Contact information:   [place] [address] [phone]       Signed: Louis Meckel 04/16/2015, 8:49 PM

## 2015-04-16 NOTE — Procedures (Signed)
Patient was seen on dialysis and the procedure was supervised.  BFR 400  Via AVF BP is  135/75.   Patient appears to be tolerating treatment well- needing emergent HD for hyperkalemia- to be sent home after   Jennifer Lawson A 04/16/2015

## 2015-04-17 NOTE — Progress Notes (Signed)
Tx complete.  Pt tolerated moderately with frequent complaints of headache, which she states is chronic for her.  Needles removed and pressure held for ten minutes.  Goal of 4542mL and net fluid removal of 4L.  Pt to discharge home post tx.

## 2015-04-18 DIAGNOSIS — D509 Iron deficiency anemia, unspecified: Secondary | ICD-10-CM | POA: Diagnosis not present

## 2015-04-18 DIAGNOSIS — D631 Anemia in chronic kidney disease: Secondary | ICD-10-CM | POA: Diagnosis not present

## 2015-04-18 DIAGNOSIS — N186 End stage renal disease: Secondary | ICD-10-CM | POA: Diagnosis not present

## 2015-04-18 DIAGNOSIS — N2581 Secondary hyperparathyroidism of renal origin: Secondary | ICD-10-CM | POA: Diagnosis not present

## 2015-04-18 DIAGNOSIS — R52 Pain, unspecified: Secondary | ICD-10-CM | POA: Diagnosis not present

## 2015-04-19 DIAGNOSIS — J449 Chronic obstructive pulmonary disease, unspecified: Secondary | ICD-10-CM | POA: Diagnosis not present

## 2015-04-19 DIAGNOSIS — R269 Unspecified abnormalities of gait and mobility: Secondary | ICD-10-CM | POA: Diagnosis not present

## 2015-04-20 DIAGNOSIS — E1122 Type 2 diabetes mellitus with diabetic chronic kidney disease: Secondary | ICD-10-CM | POA: Diagnosis not present

## 2015-04-20 DIAGNOSIS — Z720 Tobacco use: Secondary | ICD-10-CM | POA: Diagnosis not present

## 2015-04-20 DIAGNOSIS — J9611 Chronic respiratory failure with hypoxia: Secondary | ICD-10-CM | POA: Diagnosis not present

## 2015-04-20 DIAGNOSIS — Z79899 Other long term (current) drug therapy: Secondary | ICD-10-CM | POA: Diagnosis not present

## 2015-04-20 DIAGNOSIS — D631 Anemia in chronic kidney disease: Secondary | ICD-10-CM | POA: Diagnosis not present

## 2015-04-20 DIAGNOSIS — F319 Bipolar disorder, unspecified: Secondary | ICD-10-CM | POA: Diagnosis not present

## 2015-04-20 DIAGNOSIS — Z9981 Dependence on supplemental oxygen: Secondary | ICD-10-CM | POA: Diagnosis not present

## 2015-04-20 DIAGNOSIS — Z992 Dependence on renal dialysis: Secondary | ICD-10-CM | POA: Diagnosis not present

## 2015-04-20 DIAGNOSIS — N186 End stage renal disease: Secondary | ICD-10-CM | POA: Diagnosis not present

## 2015-04-20 DIAGNOSIS — F4310 Post-traumatic stress disorder, unspecified: Secondary | ICD-10-CM | POA: Diagnosis not present

## 2015-04-20 DIAGNOSIS — R569 Unspecified convulsions: Secondary | ICD-10-CM | POA: Diagnosis not present

## 2015-04-20 DIAGNOSIS — I120 Hypertensive chronic kidney disease with stage 5 chronic kidney disease or end stage renal disease: Secondary | ICD-10-CM | POA: Diagnosis not present

## 2015-04-20 DIAGNOSIS — J449 Chronic obstructive pulmonary disease, unspecified: Secondary | ICD-10-CM | POA: Diagnosis not present

## 2015-04-21 DIAGNOSIS — N186 End stage renal disease: Secondary | ICD-10-CM | POA: Diagnosis not present

## 2015-04-21 DIAGNOSIS — D509 Iron deficiency anemia, unspecified: Secondary | ICD-10-CM | POA: Diagnosis not present

## 2015-04-21 DIAGNOSIS — N2581 Secondary hyperparathyroidism of renal origin: Secondary | ICD-10-CM | POA: Diagnosis not present

## 2015-04-21 DIAGNOSIS — R52 Pain, unspecified: Secondary | ICD-10-CM | POA: Diagnosis not present

## 2015-04-21 DIAGNOSIS — D631 Anemia in chronic kidney disease: Secondary | ICD-10-CM | POA: Diagnosis not present

## 2015-04-22 DIAGNOSIS — J9611 Chronic respiratory failure with hypoxia: Secondary | ICD-10-CM | POA: Diagnosis not present

## 2015-04-22 DIAGNOSIS — F319 Bipolar disorder, unspecified: Secondary | ICD-10-CM | POA: Diagnosis not present

## 2015-04-22 DIAGNOSIS — I120 Hypertensive chronic kidney disease with stage 5 chronic kidney disease or end stage renal disease: Secondary | ICD-10-CM | POA: Diagnosis not present

## 2015-04-22 DIAGNOSIS — R569 Unspecified convulsions: Secondary | ICD-10-CM | POA: Diagnosis not present

## 2015-04-22 DIAGNOSIS — F4310 Post-traumatic stress disorder, unspecified: Secondary | ICD-10-CM | POA: Diagnosis not present

## 2015-04-22 DIAGNOSIS — Z720 Tobacco use: Secondary | ICD-10-CM | POA: Diagnosis not present

## 2015-04-22 DIAGNOSIS — Z9981 Dependence on supplemental oxygen: Secondary | ICD-10-CM | POA: Diagnosis not present

## 2015-04-22 DIAGNOSIS — D631 Anemia in chronic kidney disease: Secondary | ICD-10-CM | POA: Diagnosis not present

## 2015-04-22 DIAGNOSIS — E1122 Type 2 diabetes mellitus with diabetic chronic kidney disease: Secondary | ICD-10-CM | POA: Diagnosis not present

## 2015-04-22 DIAGNOSIS — Z992 Dependence on renal dialysis: Secondary | ICD-10-CM | POA: Diagnosis not present

## 2015-04-22 DIAGNOSIS — N186 End stage renal disease: Secondary | ICD-10-CM | POA: Diagnosis not present

## 2015-04-22 DIAGNOSIS — Z79899 Other long term (current) drug therapy: Secondary | ICD-10-CM | POA: Diagnosis not present

## 2015-04-22 DIAGNOSIS — J449 Chronic obstructive pulmonary disease, unspecified: Secondary | ICD-10-CM | POA: Diagnosis not present

## 2015-04-23 DIAGNOSIS — N186 End stage renal disease: Secondary | ICD-10-CM | POA: Diagnosis not present

## 2015-04-23 DIAGNOSIS — Z720 Tobacco use: Secondary | ICD-10-CM | POA: Diagnosis not present

## 2015-04-23 DIAGNOSIS — J9611 Chronic respiratory failure with hypoxia: Secondary | ICD-10-CM | POA: Diagnosis not present

## 2015-04-23 DIAGNOSIS — Z9981 Dependence on supplemental oxygen: Secondary | ICD-10-CM | POA: Diagnosis not present

## 2015-04-23 DIAGNOSIS — Z992 Dependence on renal dialysis: Secondary | ICD-10-CM | POA: Diagnosis not present

## 2015-04-23 DIAGNOSIS — I120 Hypertensive chronic kidney disease with stage 5 chronic kidney disease or end stage renal disease: Secondary | ICD-10-CM | POA: Diagnosis not present

## 2015-04-23 DIAGNOSIS — E1122 Type 2 diabetes mellitus with diabetic chronic kidney disease: Secondary | ICD-10-CM | POA: Diagnosis not present

## 2015-04-23 DIAGNOSIS — R569 Unspecified convulsions: Secondary | ICD-10-CM | POA: Diagnosis not present

## 2015-04-23 DIAGNOSIS — D509 Iron deficiency anemia, unspecified: Secondary | ICD-10-CM | POA: Diagnosis not present

## 2015-04-23 DIAGNOSIS — F4310 Post-traumatic stress disorder, unspecified: Secondary | ICD-10-CM | POA: Diagnosis not present

## 2015-04-23 DIAGNOSIS — D631 Anemia in chronic kidney disease: Secondary | ICD-10-CM | POA: Diagnosis not present

## 2015-04-23 DIAGNOSIS — F319 Bipolar disorder, unspecified: Secondary | ICD-10-CM | POA: Diagnosis not present

## 2015-04-23 DIAGNOSIS — Z79899 Other long term (current) drug therapy: Secondary | ICD-10-CM | POA: Diagnosis not present

## 2015-04-23 DIAGNOSIS — J449 Chronic obstructive pulmonary disease, unspecified: Secondary | ICD-10-CM | POA: Diagnosis not present

## 2015-04-23 DIAGNOSIS — N2581 Secondary hyperparathyroidism of renal origin: Secondary | ICD-10-CM | POA: Diagnosis not present

## 2015-04-23 DIAGNOSIS — R52 Pain, unspecified: Secondary | ICD-10-CM | POA: Diagnosis not present

## 2015-04-24 DIAGNOSIS — Z79899 Other long term (current) drug therapy: Secondary | ICD-10-CM | POA: Diagnosis not present

## 2015-04-24 DIAGNOSIS — I120 Hypertensive chronic kidney disease with stage 5 chronic kidney disease or end stage renal disease: Secondary | ICD-10-CM | POA: Diagnosis not present

## 2015-04-24 DIAGNOSIS — R569 Unspecified convulsions: Secondary | ICD-10-CM | POA: Diagnosis not present

## 2015-04-24 DIAGNOSIS — E1122 Type 2 diabetes mellitus with diabetic chronic kidney disease: Secondary | ICD-10-CM | POA: Diagnosis not present

## 2015-04-24 DIAGNOSIS — Z720 Tobacco use: Secondary | ICD-10-CM | POA: Diagnosis not present

## 2015-04-24 DIAGNOSIS — Z9981 Dependence on supplemental oxygen: Secondary | ICD-10-CM | POA: Diagnosis not present

## 2015-04-24 DIAGNOSIS — N186 End stage renal disease: Secondary | ICD-10-CM | POA: Diagnosis not present

## 2015-04-24 DIAGNOSIS — D631 Anemia in chronic kidney disease: Secondary | ICD-10-CM | POA: Diagnosis not present

## 2015-04-24 DIAGNOSIS — F319 Bipolar disorder, unspecified: Secondary | ICD-10-CM | POA: Diagnosis not present

## 2015-04-24 DIAGNOSIS — J9611 Chronic respiratory failure with hypoxia: Secondary | ICD-10-CM | POA: Diagnosis not present

## 2015-04-24 DIAGNOSIS — J449 Chronic obstructive pulmonary disease, unspecified: Secondary | ICD-10-CM | POA: Diagnosis not present

## 2015-04-24 DIAGNOSIS — F4310 Post-traumatic stress disorder, unspecified: Secondary | ICD-10-CM | POA: Diagnosis not present

## 2015-04-24 DIAGNOSIS — Z992 Dependence on renal dialysis: Secondary | ICD-10-CM | POA: Diagnosis not present

## 2015-04-25 DIAGNOSIS — R52 Pain, unspecified: Secondary | ICD-10-CM | POA: Diagnosis not present

## 2015-04-25 DIAGNOSIS — N186 End stage renal disease: Secondary | ICD-10-CM | POA: Diagnosis not present

## 2015-04-25 DIAGNOSIS — D509 Iron deficiency anemia, unspecified: Secondary | ICD-10-CM | POA: Diagnosis not present

## 2015-04-25 DIAGNOSIS — N2581 Secondary hyperparathyroidism of renal origin: Secondary | ICD-10-CM | POA: Diagnosis not present

## 2015-04-25 DIAGNOSIS — D631 Anemia in chronic kidney disease: Secondary | ICD-10-CM | POA: Diagnosis not present

## 2015-04-27 DIAGNOSIS — Z992 Dependence on renal dialysis: Secondary | ICD-10-CM | POA: Diagnosis not present

## 2015-04-27 DIAGNOSIS — Z79899 Other long term (current) drug therapy: Secondary | ICD-10-CM | POA: Diagnosis not present

## 2015-04-27 DIAGNOSIS — E1122 Type 2 diabetes mellitus with diabetic chronic kidney disease: Secondary | ICD-10-CM | POA: Diagnosis not present

## 2015-04-27 DIAGNOSIS — F319 Bipolar disorder, unspecified: Secondary | ICD-10-CM | POA: Diagnosis not present

## 2015-04-27 DIAGNOSIS — R569 Unspecified convulsions: Secondary | ICD-10-CM | POA: Diagnosis not present

## 2015-04-27 DIAGNOSIS — N186 End stage renal disease: Secondary | ICD-10-CM | POA: Diagnosis not present

## 2015-04-27 DIAGNOSIS — I120 Hypertensive chronic kidney disease with stage 5 chronic kidney disease or end stage renal disease: Secondary | ICD-10-CM | POA: Diagnosis not present

## 2015-04-27 DIAGNOSIS — F4310 Post-traumatic stress disorder, unspecified: Secondary | ICD-10-CM | POA: Diagnosis not present

## 2015-04-27 DIAGNOSIS — Z720 Tobacco use: Secondary | ICD-10-CM | POA: Diagnosis not present

## 2015-04-27 DIAGNOSIS — Z9981 Dependence on supplemental oxygen: Secondary | ICD-10-CM | POA: Diagnosis not present

## 2015-04-27 DIAGNOSIS — D631 Anemia in chronic kidney disease: Secondary | ICD-10-CM | POA: Diagnosis not present

## 2015-04-27 DIAGNOSIS — J9611 Chronic respiratory failure with hypoxia: Secondary | ICD-10-CM | POA: Diagnosis not present

## 2015-04-27 DIAGNOSIS — J449 Chronic obstructive pulmonary disease, unspecified: Secondary | ICD-10-CM | POA: Diagnosis not present

## 2015-04-28 DIAGNOSIS — E1122 Type 2 diabetes mellitus with diabetic chronic kidney disease: Secondary | ICD-10-CM | POA: Diagnosis not present

## 2015-04-28 DIAGNOSIS — Z9981 Dependence on supplemental oxygen: Secondary | ICD-10-CM | POA: Diagnosis not present

## 2015-04-28 DIAGNOSIS — Z992 Dependence on renal dialysis: Secondary | ICD-10-CM | POA: Diagnosis not present

## 2015-04-28 DIAGNOSIS — J9611 Chronic respiratory failure with hypoxia: Secondary | ICD-10-CM | POA: Diagnosis not present

## 2015-04-28 DIAGNOSIS — N186 End stage renal disease: Secondary | ICD-10-CM | POA: Diagnosis not present

## 2015-04-28 DIAGNOSIS — F4310 Post-traumatic stress disorder, unspecified: Secondary | ICD-10-CM | POA: Diagnosis not present

## 2015-04-28 DIAGNOSIS — J449 Chronic obstructive pulmonary disease, unspecified: Secondary | ICD-10-CM | POA: Diagnosis not present

## 2015-04-28 DIAGNOSIS — R569 Unspecified convulsions: Secondary | ICD-10-CM | POA: Diagnosis not present

## 2015-04-28 DIAGNOSIS — D631 Anemia in chronic kidney disease: Secondary | ICD-10-CM | POA: Diagnosis not present

## 2015-04-28 DIAGNOSIS — Z79899 Other long term (current) drug therapy: Secondary | ICD-10-CM | POA: Diagnosis not present

## 2015-04-28 DIAGNOSIS — F319 Bipolar disorder, unspecified: Secondary | ICD-10-CM | POA: Diagnosis not present

## 2015-04-28 DIAGNOSIS — I120 Hypertensive chronic kidney disease with stage 5 chronic kidney disease or end stage renal disease: Secondary | ICD-10-CM | POA: Diagnosis not present

## 2015-04-28 DIAGNOSIS — Z720 Tobacco use: Secondary | ICD-10-CM | POA: Diagnosis not present

## 2015-04-30 DIAGNOSIS — Z992 Dependence on renal dialysis: Secondary | ICD-10-CM | POA: Diagnosis not present

## 2015-04-30 DIAGNOSIS — F4310 Post-traumatic stress disorder, unspecified: Secondary | ICD-10-CM | POA: Diagnosis not present

## 2015-04-30 DIAGNOSIS — J9611 Chronic respiratory failure with hypoxia: Secondary | ICD-10-CM | POA: Diagnosis not present

## 2015-04-30 DIAGNOSIS — F319 Bipolar disorder, unspecified: Secondary | ICD-10-CM | POA: Diagnosis not present

## 2015-04-30 DIAGNOSIS — E1122 Type 2 diabetes mellitus with diabetic chronic kidney disease: Secondary | ICD-10-CM | POA: Diagnosis not present

## 2015-04-30 DIAGNOSIS — N2581 Secondary hyperparathyroidism of renal origin: Secondary | ICD-10-CM | POA: Diagnosis not present

## 2015-04-30 DIAGNOSIS — D631 Anemia in chronic kidney disease: Secondary | ICD-10-CM | POA: Diagnosis not present

## 2015-04-30 DIAGNOSIS — Z79899 Other long term (current) drug therapy: Secondary | ICD-10-CM | POA: Diagnosis not present

## 2015-04-30 DIAGNOSIS — Z720 Tobacco use: Secondary | ICD-10-CM | POA: Diagnosis not present

## 2015-04-30 DIAGNOSIS — Z9981 Dependence on supplemental oxygen: Secondary | ICD-10-CM | POA: Diagnosis not present

## 2015-04-30 DIAGNOSIS — R569 Unspecified convulsions: Secondary | ICD-10-CM | POA: Diagnosis not present

## 2015-04-30 DIAGNOSIS — N186 End stage renal disease: Secondary | ICD-10-CM | POA: Diagnosis not present

## 2015-04-30 DIAGNOSIS — I120 Hypertensive chronic kidney disease with stage 5 chronic kidney disease or end stage renal disease: Secondary | ICD-10-CM | POA: Diagnosis not present

## 2015-04-30 DIAGNOSIS — R52 Pain, unspecified: Secondary | ICD-10-CM | POA: Diagnosis not present

## 2015-04-30 DIAGNOSIS — D509 Iron deficiency anemia, unspecified: Secondary | ICD-10-CM | POA: Diagnosis not present

## 2015-04-30 DIAGNOSIS — J449 Chronic obstructive pulmonary disease, unspecified: Secondary | ICD-10-CM | POA: Diagnosis not present

## 2015-05-01 DIAGNOSIS — R569 Unspecified convulsions: Secondary | ICD-10-CM | POA: Diagnosis not present

## 2015-05-01 DIAGNOSIS — N2581 Secondary hyperparathyroidism of renal origin: Secondary | ICD-10-CM | POA: Diagnosis not present

## 2015-05-01 DIAGNOSIS — J9611 Chronic respiratory failure with hypoxia: Secondary | ICD-10-CM | POA: Diagnosis not present

## 2015-05-01 DIAGNOSIS — Z992 Dependence on renal dialysis: Secondary | ICD-10-CM | POA: Diagnosis not present

## 2015-05-01 DIAGNOSIS — J449 Chronic obstructive pulmonary disease, unspecified: Secondary | ICD-10-CM | POA: Diagnosis not present

## 2015-05-01 DIAGNOSIS — Z9981 Dependence on supplemental oxygen: Secondary | ICD-10-CM | POA: Diagnosis not present

## 2015-05-01 DIAGNOSIS — R52 Pain, unspecified: Secondary | ICD-10-CM | POA: Diagnosis not present

## 2015-05-01 DIAGNOSIS — Z79899 Other long term (current) drug therapy: Secondary | ICD-10-CM | POA: Diagnosis not present

## 2015-05-01 DIAGNOSIS — D631 Anemia in chronic kidney disease: Secondary | ICD-10-CM | POA: Diagnosis not present

## 2015-05-01 DIAGNOSIS — F4310 Post-traumatic stress disorder, unspecified: Secondary | ICD-10-CM | POA: Diagnosis not present

## 2015-05-01 DIAGNOSIS — N186 End stage renal disease: Secondary | ICD-10-CM | POA: Diagnosis not present

## 2015-05-01 DIAGNOSIS — I120 Hypertensive chronic kidney disease with stage 5 chronic kidney disease or end stage renal disease: Secondary | ICD-10-CM | POA: Diagnosis not present

## 2015-05-01 DIAGNOSIS — E1122 Type 2 diabetes mellitus with diabetic chronic kidney disease: Secondary | ICD-10-CM | POA: Diagnosis not present

## 2015-05-01 DIAGNOSIS — D509 Iron deficiency anemia, unspecified: Secondary | ICD-10-CM | POA: Diagnosis not present

## 2015-05-01 DIAGNOSIS — Z720 Tobacco use: Secondary | ICD-10-CM | POA: Diagnosis not present

## 2015-05-01 DIAGNOSIS — F319 Bipolar disorder, unspecified: Secondary | ICD-10-CM | POA: Diagnosis not present

## 2015-05-02 DIAGNOSIS — N186 End stage renal disease: Secondary | ICD-10-CM | POA: Diagnosis not present

## 2015-05-02 DIAGNOSIS — D631 Anemia in chronic kidney disease: Secondary | ICD-10-CM | POA: Diagnosis not present

## 2015-05-02 DIAGNOSIS — D509 Iron deficiency anemia, unspecified: Secondary | ICD-10-CM | POA: Diagnosis not present

## 2015-05-02 DIAGNOSIS — R52 Pain, unspecified: Secondary | ICD-10-CM | POA: Diagnosis not present

## 2015-05-02 DIAGNOSIS — N2581 Secondary hyperparathyroidism of renal origin: Secondary | ICD-10-CM | POA: Diagnosis not present

## 2015-05-04 DIAGNOSIS — R2681 Unsteadiness on feet: Secondary | ICD-10-CM | POA: Diagnosis not present

## 2015-05-04 DIAGNOSIS — E1122 Type 2 diabetes mellitus with diabetic chronic kidney disease: Secondary | ICD-10-CM | POA: Diagnosis not present

## 2015-05-04 DIAGNOSIS — J45909 Unspecified asthma, uncomplicated: Secondary | ICD-10-CM | POA: Diagnosis not present

## 2015-05-04 DIAGNOSIS — Z9981 Dependence on supplemental oxygen: Secondary | ICD-10-CM | POA: Diagnosis not present

## 2015-05-04 DIAGNOSIS — F319 Bipolar disorder, unspecified: Secondary | ICD-10-CM | POA: Diagnosis not present

## 2015-05-04 DIAGNOSIS — J9611 Chronic respiratory failure with hypoxia: Secondary | ICD-10-CM | POA: Diagnosis not present

## 2015-05-04 DIAGNOSIS — R569 Unspecified convulsions: Secondary | ICD-10-CM | POA: Diagnosis not present

## 2015-05-04 DIAGNOSIS — Z7951 Long term (current) use of inhaled steroids: Secondary | ICD-10-CM | POA: Diagnosis not present

## 2015-05-04 DIAGNOSIS — J449 Chronic obstructive pulmonary disease, unspecified: Secondary | ICD-10-CM | POA: Diagnosis not present

## 2015-05-04 DIAGNOSIS — F4310 Post-traumatic stress disorder, unspecified: Secondary | ICD-10-CM | POA: Diagnosis not present

## 2015-05-04 DIAGNOSIS — N186 End stage renal disease: Secondary | ICD-10-CM | POA: Diagnosis not present

## 2015-05-04 DIAGNOSIS — I120 Hypertensive chronic kidney disease with stage 5 chronic kidney disease or end stage renal disease: Secondary | ICD-10-CM | POA: Diagnosis not present

## 2015-05-04 DIAGNOSIS — M545 Low back pain: Secondary | ICD-10-CM | POA: Diagnosis not present

## 2015-05-05 DIAGNOSIS — F319 Bipolar disorder, unspecified: Secondary | ICD-10-CM | POA: Diagnosis not present

## 2015-05-05 DIAGNOSIS — R569 Unspecified convulsions: Secondary | ICD-10-CM | POA: Diagnosis not present

## 2015-05-05 DIAGNOSIS — J449 Chronic obstructive pulmonary disease, unspecified: Secondary | ICD-10-CM | POA: Diagnosis not present

## 2015-05-05 DIAGNOSIS — I120 Hypertensive chronic kidney disease with stage 5 chronic kidney disease or end stage renal disease: Secondary | ICD-10-CM | POA: Diagnosis not present

## 2015-05-05 DIAGNOSIS — R2681 Unsteadiness on feet: Secondary | ICD-10-CM | POA: Diagnosis not present

## 2015-05-05 DIAGNOSIS — F4310 Post-traumatic stress disorder, unspecified: Secondary | ICD-10-CM | POA: Diagnosis not present

## 2015-05-05 DIAGNOSIS — Z9981 Dependence on supplemental oxygen: Secondary | ICD-10-CM | POA: Diagnosis not present

## 2015-05-05 DIAGNOSIS — J9611 Chronic respiratory failure with hypoxia: Secondary | ICD-10-CM | POA: Diagnosis not present

## 2015-05-05 DIAGNOSIS — M545 Low back pain: Secondary | ICD-10-CM | POA: Diagnosis not present

## 2015-05-05 DIAGNOSIS — Z7951 Long term (current) use of inhaled steroids: Secondary | ICD-10-CM | POA: Diagnosis not present

## 2015-05-05 DIAGNOSIS — E1122 Type 2 diabetes mellitus with diabetic chronic kidney disease: Secondary | ICD-10-CM | POA: Diagnosis not present

## 2015-05-05 DIAGNOSIS — J45909 Unspecified asthma, uncomplicated: Secondary | ICD-10-CM | POA: Diagnosis not present

## 2015-05-05 DIAGNOSIS — N186 End stage renal disease: Secondary | ICD-10-CM | POA: Diagnosis not present

## 2015-05-06 DIAGNOSIS — J449 Chronic obstructive pulmonary disease, unspecified: Secondary | ICD-10-CM | POA: Diagnosis not present

## 2015-05-06 DIAGNOSIS — Z7951 Long term (current) use of inhaled steroids: Secondary | ICD-10-CM | POA: Diagnosis not present

## 2015-05-06 DIAGNOSIS — M545 Low back pain: Secondary | ICD-10-CM | POA: Diagnosis not present

## 2015-05-06 DIAGNOSIS — E1122 Type 2 diabetes mellitus with diabetic chronic kidney disease: Secondary | ICD-10-CM | POA: Diagnosis not present

## 2015-05-06 DIAGNOSIS — J45909 Unspecified asthma, uncomplicated: Secondary | ICD-10-CM | POA: Diagnosis not present

## 2015-05-06 DIAGNOSIS — I120 Hypertensive chronic kidney disease with stage 5 chronic kidney disease or end stage renal disease: Secondary | ICD-10-CM | POA: Diagnosis not present

## 2015-05-06 DIAGNOSIS — R2681 Unsteadiness on feet: Secondary | ICD-10-CM | POA: Diagnosis not present

## 2015-05-06 DIAGNOSIS — Z9981 Dependence on supplemental oxygen: Secondary | ICD-10-CM | POA: Diagnosis not present

## 2015-05-06 DIAGNOSIS — R52 Pain, unspecified: Secondary | ICD-10-CM | POA: Diagnosis not present

## 2015-05-06 DIAGNOSIS — N2581 Secondary hyperparathyroidism of renal origin: Secondary | ICD-10-CM | POA: Diagnosis not present

## 2015-05-06 DIAGNOSIS — F319 Bipolar disorder, unspecified: Secondary | ICD-10-CM | POA: Diagnosis not present

## 2015-05-06 DIAGNOSIS — N186 End stage renal disease: Secondary | ICD-10-CM | POA: Diagnosis not present

## 2015-05-06 DIAGNOSIS — F4310 Post-traumatic stress disorder, unspecified: Secondary | ICD-10-CM | POA: Diagnosis not present

## 2015-05-06 DIAGNOSIS — D509 Iron deficiency anemia, unspecified: Secondary | ICD-10-CM | POA: Diagnosis not present

## 2015-05-06 DIAGNOSIS — R569 Unspecified convulsions: Secondary | ICD-10-CM | POA: Diagnosis not present

## 2015-05-06 DIAGNOSIS — D631 Anemia in chronic kidney disease: Secondary | ICD-10-CM | POA: Diagnosis not present

## 2015-05-06 DIAGNOSIS — J9611 Chronic respiratory failure with hypoxia: Secondary | ICD-10-CM | POA: Diagnosis not present

## 2015-05-07 DIAGNOSIS — N186 End stage renal disease: Secondary | ICD-10-CM | POA: Diagnosis not present

## 2015-05-07 DIAGNOSIS — D509 Iron deficiency anemia, unspecified: Secondary | ICD-10-CM | POA: Diagnosis not present

## 2015-05-07 DIAGNOSIS — R52 Pain, unspecified: Secondary | ICD-10-CM | POA: Diagnosis not present

## 2015-05-07 DIAGNOSIS — N2581 Secondary hyperparathyroidism of renal origin: Secondary | ICD-10-CM | POA: Diagnosis not present

## 2015-05-07 DIAGNOSIS — D631 Anemia in chronic kidney disease: Secondary | ICD-10-CM | POA: Diagnosis not present

## 2015-05-08 DIAGNOSIS — J449 Chronic obstructive pulmonary disease, unspecified: Secondary | ICD-10-CM | POA: Diagnosis not present

## 2015-05-08 DIAGNOSIS — J9611 Chronic respiratory failure with hypoxia: Secondary | ICD-10-CM | POA: Diagnosis not present

## 2015-05-08 DIAGNOSIS — Z7951 Long term (current) use of inhaled steroids: Secondary | ICD-10-CM | POA: Diagnosis not present

## 2015-05-08 DIAGNOSIS — J45909 Unspecified asthma, uncomplicated: Secondary | ICD-10-CM | POA: Diagnosis not present

## 2015-05-08 DIAGNOSIS — R569 Unspecified convulsions: Secondary | ICD-10-CM | POA: Diagnosis not present

## 2015-05-08 DIAGNOSIS — I120 Hypertensive chronic kidney disease with stage 5 chronic kidney disease or end stage renal disease: Secondary | ICD-10-CM | POA: Diagnosis not present

## 2015-05-08 DIAGNOSIS — F4310 Post-traumatic stress disorder, unspecified: Secondary | ICD-10-CM | POA: Diagnosis not present

## 2015-05-08 DIAGNOSIS — M545 Low back pain: Secondary | ICD-10-CM | POA: Diagnosis not present

## 2015-05-08 DIAGNOSIS — E1122 Type 2 diabetes mellitus with diabetic chronic kidney disease: Secondary | ICD-10-CM | POA: Diagnosis not present

## 2015-05-08 DIAGNOSIS — Z9981 Dependence on supplemental oxygen: Secondary | ICD-10-CM | POA: Diagnosis not present

## 2015-05-08 DIAGNOSIS — F319 Bipolar disorder, unspecified: Secondary | ICD-10-CM | POA: Diagnosis not present

## 2015-05-08 DIAGNOSIS — R2681 Unsteadiness on feet: Secondary | ICD-10-CM | POA: Diagnosis not present

## 2015-05-08 DIAGNOSIS — N186 End stage renal disease: Secondary | ICD-10-CM | POA: Diagnosis not present

## 2015-05-30 ENCOUNTER — Encounter (HOSPITAL_COMMUNITY): Payer: Self-pay | Admitting: *Deleted

## 2015-05-30 ENCOUNTER — Emergency Department (HOSPITAL_COMMUNITY)
Admission: EM | Admit: 2015-05-30 | Discharge: 2015-06-01 | Disposition: A | Discharge status: Other Acute Inpatient Hospital | Payer: Medicare Other | Attending: Emergency Medicine | Admitting: Emergency Medicine

## 2015-05-30 DIAGNOSIS — E119 Type 2 diabetes mellitus without complications: Secondary | ICD-10-CM | POA: Diagnosis not present

## 2015-05-30 DIAGNOSIS — Z79899 Other long term (current) drug therapy: Secondary | ICD-10-CM | POA: Insufficient documentation

## 2015-05-30 DIAGNOSIS — I120 Hypertensive chronic kidney disease with stage 5 chronic kidney disease or end stage renal disease: Secondary | ICD-10-CM | POA: Diagnosis not present

## 2015-05-30 DIAGNOSIS — F4310 Post-traumatic stress disorder, unspecified: Secondary | ICD-10-CM | POA: Insufficient documentation

## 2015-05-30 DIAGNOSIS — Z9119 Patient's noncompliance with other medical treatment and regimen: Secondary | ICD-10-CM | POA: Insufficient documentation

## 2015-05-30 DIAGNOSIS — K219 Gastro-esophageal reflux disease without esophagitis: Secondary | ICD-10-CM | POA: Diagnosis not present

## 2015-05-30 DIAGNOSIS — Z992 Dependence on renal dialysis: Secondary | ICD-10-CM | POA: Insufficient documentation

## 2015-05-30 DIAGNOSIS — F319 Bipolar disorder, unspecified: Secondary | ICD-10-CM | POA: Insufficient documentation

## 2015-05-30 DIAGNOSIS — N186 End stage renal disease: Secondary | ICD-10-CM | POA: Diagnosis not present

## 2015-05-30 DIAGNOSIS — F17210 Nicotine dependence, cigarettes, uncomplicated: Secondary | ICD-10-CM | POA: Insufficient documentation

## 2015-05-30 DIAGNOSIS — Z9114 Patient's other noncompliance with medication regimen: Secondary | ICD-10-CM

## 2015-05-30 DIAGNOSIS — R011 Cardiac murmur, unspecified: Secondary | ICD-10-CM | POA: Diagnosis not present

## 2015-05-30 DIAGNOSIS — J449 Chronic obstructive pulmonary disease, unspecified: Secondary | ICD-10-CM | POA: Insufficient documentation

## 2015-05-30 DIAGNOSIS — F23 Brief psychotic disorder: Secondary | ICD-10-CM | POA: Insufficient documentation

## 2015-05-30 DIAGNOSIS — R4182 Altered mental status, unspecified: Secondary | ICD-10-CM | POA: Diagnosis present

## 2015-05-30 LAB — COMPREHENSIVE METABOLIC PANEL
ALT: 16 U/L (ref 14–54)
AST: 21 U/L (ref 15–41)
Albumin: 3.5 g/dL (ref 3.5–5.0)
Alkaline Phosphatase: 152 U/L — ABNORMAL HIGH (ref 38–126)
Anion gap: 16 — ABNORMAL HIGH (ref 5–15)
BUN: 35 mg/dL — ABNORMAL HIGH (ref 6–20)
CO2: 24 mmol/L (ref 22–32)
Calcium: 10.3 mg/dL (ref 8.9–10.3)
Chloride: 98 mmol/L — ABNORMAL LOW (ref 101–111)
Creatinine, Ser: 4.37 mg/dL — ABNORMAL HIGH (ref 0.44–1.00)
GFR calc Af Amer: 12 mL/min — ABNORMAL LOW (ref >60)
GFR calc non Af Amer: 10 mL/min — ABNORMAL LOW (ref >60)
Glucose, Bld: 90 mg/dL (ref 65–99)
Potassium: 3.6 mmol/L (ref 3.5–5.1)
Sodium: 138 mmol/L (ref 135–145)
Total Bilirubin: 0.6 mg/dL (ref 0.3–1.2)
Total Protein: 7.7 g/dL (ref 6.5–8.1)

## 2015-05-30 LAB — CBC
HCT: 42.4 % (ref 36.0–46.0)
Hemoglobin: 13 g/dL (ref 12.0–15.0)
MCH: 31.8 pg (ref 26.0–34.0)
MCHC: 30.7 g/dL (ref 30.0–36.0)
MCV: 103.7 fL — ABNORMAL HIGH (ref 78.0–100.0)
Platelets: 256 10*3/uL (ref 150–400)
RBC: 4.09 MIL/uL (ref 3.87–5.11)
RDW: 21.6 % — ABNORMAL HIGH (ref 11.5–15.5)
WBC: 5 10*3/uL (ref 4.0–10.5)

## 2015-05-30 LAB — I-STAT CG4 LACTIC ACID, ED: Lactic Acid, Venous: 1.18 mmol/L (ref 0.5–2.0)

## 2015-05-30 LAB — I-STAT TROPONIN, ED: Troponin i, poc: 0.04 ng/mL (ref 0.00–0.08)

## 2015-05-30 LAB — VALPROIC ACID LEVEL: Valproic Acid Lvl: 37 ug/mL — ABNORMAL LOW (ref 50.0–100.0)

## 2015-05-30 MED ORDER — SEVELAMER CARBONATE 800 MG PO TABS
1600.0000 mg | ORAL_TABLET | Freq: Three times a day (TID) | ORAL | Status: DC
  Administered 2015-05-30 – 2015-06-01 (×5): 1600 mg via ORAL
  Filled 2015-05-30 (×8): qty 2

## 2015-05-30 MED ORDER — ALBUTEROL SULFATE HFA 108 (90 BASE) MCG/ACT IN AERS
2.0000 | INHALATION_SPRAY | Freq: Four times a day (QID) | RESPIRATORY_TRACT | Status: DC | PRN
  Administered 2015-05-31 (×2): 2 via RESPIRATORY_TRACT
  Filled 2015-05-30: qty 6.7

## 2015-05-30 MED ORDER — METOPROLOL TARTRATE 25 MG PO TABS
12.5000 mg | ORAL_TABLET | Freq: Two times a day (BID) | ORAL | Status: DC
  Administered 2015-05-30 – 2015-06-01 (×4): 12.5 mg via ORAL
  Filled 2015-05-30 (×4): qty 1

## 2015-05-30 MED ORDER — LORAZEPAM 0.5 MG PO TABS
0.5000 mg | ORAL_TABLET | Freq: Three times a day (TID) | ORAL | Status: DC | PRN
  Administered 2015-05-30 – 2015-06-01 (×3): 0.5 mg via ORAL
  Filled 2015-05-30 (×4): qty 1

## 2015-05-30 MED ORDER — DIVALPROEX SODIUM ER 500 MG PO TB24
500.0000 mg | ORAL_TABLET | Freq: Two times a day (BID) | ORAL | Status: DC
  Administered 2015-05-30 – 2015-06-01 (×4): 500 mg via ORAL
  Filled 2015-05-30 (×4): qty 1

## 2015-05-30 MED ORDER — CINACALCET HCL 30 MG PO TABS
60.0000 mg | ORAL_TABLET | Freq: Every day | ORAL | Status: DC
  Administered 2015-05-30 – 2015-06-01 (×3): 60 mg via ORAL
  Filled 2015-05-30 (×5): qty 2

## 2015-05-30 MED ORDER — ZIPRASIDONE HCL 20 MG PO CAPS
60.0000 mg | ORAL_CAPSULE | Freq: Two times a day (BID) | ORAL | Status: DC
  Administered 2015-05-31 – 2015-06-01 (×2): 60 mg via ORAL
  Filled 2015-05-30 (×4): qty 3

## 2015-05-30 NOTE — ED Notes (Signed)
Pt asking for headache med and then asked for oxygen for sleep  No orders seen for 02

## 2015-05-30 NOTE — ED Notes (Signed)
Pt upset with lab tech attempting to get blood sample, refusing.

## 2015-05-30 NOTE — ED Notes (Signed)
telepsych in process. 

## 2015-05-30 NOTE — ED Notes (Signed)
Per family, pt has hx of bipolar and hasn't been taking her medication x 1 week. Pt very anxious, paranoid and acting abnormal per family. Last dialysis was yesterday.

## 2015-05-30 NOTE — BH Assessment (Addendum)
Tele Assessment Note   Jennifer Lawson is an 55 y.o. female who was brought to the Emergency Department by her daughter due to increasingly bizarre behavior over the past week. Pt was unwilling to participate in assessment so daughter gave collateral information. Pt was agitated, paranoid and suspicious of Probation officer. Pt has a long history of psychiatric admissions due to Bipolar Disorder with psychosis. Per daughter's report she has been off her medications for a week and her functioning has gradually declined. Daughter lives with pt and states that she has been walking around the house naked- she came home to her mother naked in bed with her son last night. She states that she has not been oriented to time, place or situation and has been paranoid. Pt appears to be responding to internal stimuli at the time of assessment. Daughter states that she has been "tearing up the house", pulling out papers and leaving them all over the floor. She has also been pulling food out of the refrigerator and leaving it around the house. No reports of SI, HI noted but pt has a history of attempted overdose. Pt is currently on dialysis and just received treatment yesterday.   Per Catalina Pizza NP- Inpatient is recommended for stabilization.   Diagnosis: Bipolar 1 disorder with psychotic features   Past Medical History:  Past Medical History  Diagnosis Date  . Mental disorder   . Depression   . Hypertension   . Overdose   . Tobacco use disorder 11/13/2012  . Complication of anesthesia     difficulty going to sleep  . Chronic kidney disease     06/11/13- not on dialysis  . Shortness of breath     lying down flat  . PTSD (post-traumatic stress disorder)   . Asthma   . COPD (chronic obstructive pulmonary disease) ([place])   . Heart murmur   . GERD (gastroesophageal reflux disease)   . Seizures ([place])     "passsed out"  . History of blood transfusion   . Diabetes mellitus without complication [facility])     denies     Past Surgical History  Procedure Laterality Date  . Right knee replacement      she says it was last year.  . Esophagogastroduodenoscopy Left 11/14/2012    Procedure: ESOPHAGOGASTRODUODENOSCOPY (EGD);  Surgeon: [name], MD;  Location: [facility];  Service: Endoscopy;  Laterality: Left;  . Joint replacement Right     knee  . Parathyroidectomy    . Av fistula placement Right 06/12/2013    Procedure: ARTERIOVENOUS (AV) FISTULA CREATION; RIGHT  BASILIC VEIN TRANSPOSITION with Intraoperative ultrasound;  Surgeon: Mal [name], MD;  Location: [facility] OR;  Service: Vascular;  Laterality: Right;    Family History:  Family History  Problem Relation Age of Onset  . Diabetes Mother   . Hyperlipidemia Mother   . Hypertension Mother   . Diabetes Father   . Hypertension Father   . Hyperlipidemia Father     Social History:  reports that she has been smoking Cigarettes and Cigars.  She has a 80 pack-year smoking history. She has never used smokeless tobacco. She reports that she does not drink alcohol or use illicit drugs.  Additional Social History:  Alcohol / Drug Use History of alcohol / drug use?: No history of alcohol / drug abuse  CIWA: CIWA-Ar BP: (!) 142/104 mmHg Pulse Rate: 110 COWS:    PATIENT STRENGTHS: (choose at least two) General fund of knowledge Supportive family/friends  Allergies:  Allergies  Allergen Reactions  . Codeine Sulfate Anaphylaxis    Daughter called about having this allergy   . Depakote [Divalproex Sodium] Other (See Comments)    Nose bleeds  . Gabapentin Other (See Comments)    seizures  . Haldol [Haloperidol Lactate] Shortness Of Breath  . Risperidone And Related Shortness Of Breath  . Trazodone And Nefazodone Other (See Comments)    Makes pt lose balance and fall  . Invega [Paliperidone Er] Nausea And Vomiting  . Vistaril [Hydroxyzine Hcl] Nausea And Vomiting    Home Medications:  (Not in a hospital admission)  OB/GYN Status:  Patient's  last menstrual period was 05/09/2008.  General Assessment Data Location of Assessment: [facility] ED TTS Assessment: In system Is this a Tele or Face-to-Face Assessment?: Tele Assessment Is this an Initial Assessment or a Re-assessment for this encounter?: Initial Assessment Is patient pregnant?: No Pregnancy Status: No Living Arrangements: Children Can pt return to current living arrangement?: Yes Admission Status: Voluntary Is patient capable of signing voluntary admission?: Yes Referral Source: Self/Family/Friend Insurance type:  Production designer, theatre/television/film healthcare )     Crisis Care Plan Living Arrangements: Children Name of Psychiatrist: Dr. Saundra Shelling [facility]  Name of Therapist: None  Education Status Is patient currently in school?: No Highest grade of school patient has completed: unknown  Risk to self with the past 6 months Suicidal Ideation: No Has patient been a risk to self within the past 6 months prior to admission? : No Suicidal Intent: No Has patient had any suicidal intent within the past 6 months prior to admission? : No Is patient at risk for suicide?: No Suicidal Plan?: No Has patient had any suicidal plan within the past 6 months prior to admission? : No Access to Means: No What has been your use of drugs/alcohol within the last 12 months?: none reported Previous Attempts/Gestures: Yes How many times?: 1 Other Self Harm Risks: confusion, paranoia, delusions Triggers for Past Attempts: Unpredictable Intentional Self Injurious Behavior: None Family Suicide History: Unknown Recent stressful life event(s): Other (Comment) (stopped taking medication) Persecutory voices/beliefs?: Yes Depression: Yes Depression Symptoms: Despondent, Insomnia, Feeling angry/irritable Substance abuse history and/or treatment for substance abuse?: No Suicide prevention information given to non-admitted patients: Not applicable  Risk to Others within the past 6 months Homicidal  Ideation: No Does patient have any lifetime risk of violence toward others beyond the six months prior to admission? : No Thoughts of Harm to Others: No Current Homicidal Intent: No Current Homicidal Plan: No Access to Homicidal Means: No Identified Victim: none History of harm to others?: No Assessment of Violence: None Noted Violent Behavior Description: none Does patient have access to weapons?: No Criminal Charges Pending?: No Does patient have a court date: No Is patient on probation?: No  Psychosis Hallucinations: Auditory Delusions: Unspecified  Mental Status Report Appearance/Hygiene: Bizarre, Disheveled Eye Contact: Poor Motor Activity: Freedom of movement Speech: Incoherent Level of Consciousness: Alert Mood: Suspicious Affect: Angry Anxiety Level: None Thought Processes: Irrelevant Judgement: Impaired Orientation: Person Obsessive Compulsive Thoughts/Behaviors: Moderate  Cognitive Functioning Concentration: Decreased Memory: Recent Intact, Remote Intact IQ: Average Insight: Poor Impulse Control: Poor Appetite: Poor Sleep: Decreased Total Hours of Sleep:  (unknown) Vegetative Symptoms: Not bathing, Decreased grooming  ADLScreening [facility] Assessment Services) Patient's cognitive ability adequate to safely complete daily activities?: No Patient able to express need for assistance with ADLs?: No Independently performs ADLs?: Yes (appropriate for developmental age)  Prior Inpatient Therapy Prior Inpatient Therapy: Yes Prior Therapy Dates: last  admission 2016 Prior Therapy Facilty/Provider(s): [facility] Reason for Treatment: Psychosis   Prior Outpatient Therapy Prior Outpatient Therapy: Yes Prior Therapy Dates: ongoing Prior Therapy Facilty/Provider(s): [facility]  Reason for Treatment: Bipolar disorder Does patient have an ACCT team?: No Does patient have Intensive In-House Services?  : No Does patient have [facility] services? : No Does  patient have P4CC services?: No  ADL Screening (condition at time of admission) Patient's cognitive ability adequate to safely complete daily activities?: No Is the patient deaf or have difficulty hearing?: No Does the patient have difficulty seeing, even when wearing glasses/contacts?: No Does the patient have difficulty concentrating, remembering, or making decisions?: Yes Patient able to express need for assistance with ADLs?: No Does the patient have difficulty dressing or bathing?: Yes Independently performs ADLs?: Yes (appropriate for developmental age) Does the patient have difficulty walking or climbing stairs?: No Weakness of Legs: None Weakness of Arms/Hands: None  Home Assistive Devices/Equipment Home Assistive Devices/Equipment: None  Therapy Consults (therapy consults require a physician order) PT Evaluation Needed: No OT Evalulation Needed: No SLP Evaluation Needed: No Abuse/Neglect Assessment (Assessment to be complete while patient is alone) Physical Abuse:  (UTA) Verbal Abuse:  (UTA) Sexual Abuse:  (UTA) Exploitation of patient/patient's resources:  (UTA) Self-Neglect:  (UTA) Possible abuse reported to::  (UTA) Values / Beliefs Cultural Requests During Hospitalization: None Spiritual Requests During Hospitalization: None Consults Spiritual Care Consult Needed: No Social Work Consult Needed: No Regulatory affairs officer (For Healthcare) Does patient have an advance directive?: No Would patient like information on creating an advanced directive?: No - patient declined information    Additional Information 1:1 In Past 12 Months?: No CIRT Risk: No Elopement Risk: Yes Does patient have medical clearance?: No     Disposition:  Disposition Initial Assessment Completed for this Encounter: Yes Disposition of Patient: Inpatient treatment program Type of inpatient treatment program: Adult  Natassia Guthridge 05/30/2015 5:49 PM

## 2015-05-30 NOTE — ED Notes (Addendum)
All belonging sent with patient's daughter.

## 2015-05-30 NOTE — ED Notes (Signed)
Meal tray ordered for patient.

## 2015-05-30 NOTE — ED Provider Notes (Addendum)
CSN: [id number]     Arrival date & time 05/30/15  1354 History   First MD Initiated Contact with Patient 05/30/15 1644     Chief Complaint  Patient presents with  . Medical Clearance  . Altered Mental Status     (Consider location/radiation/quality/duration/timing/severity/associated sxs/prior Treatment) Patient is a 55 y.o. female presenting with altered mental status. The history is provided by the patient and a relative. The history is limited by the condition of the patient.  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  Pt has been off her meds including geodon in the past 1-2 weeks.  Daughter states acting same as when off meds in past.  Pt has been up and pacing all night, wanders around the house completed naked, is emptying drawers, emptying refrigerator and freezer, acting paranoid, thinking people are at door or coming into house.  No attempts at harm to self or other.  Pt is alert, awake, and does not answer questions - level 5 caveat.  Daughter indicates pt would not go to dialysis this past Tuesday or Thursday, but did go yesterday and had normal dialysis session. No hx trauma or fall. No syncope. No hx etoh or drug abuse.      Past Medical History  Diagnosis Date  . Mental disorder   . Depression   . Hypertension   . Overdose   . Tobacco use disorder 11/13/2012  . Complication of anesthesia     difficulty going to sleep  . Chronic kidney disease     06/11/13- not on dialysis  . Shortness of breath     lying down flat  . PTSD (post-traumatic stress disorder)   . Asthma   . COPD (chronic obstructive pulmonary disease) ([place])   . Heart murmur   . GERD (gastroesophageal reflux disease)   . Seizures ([place])     "passsed out"  . History of blood transfusion   . Diabetes mellitus without complication [facility])      denies   Past Surgical History  Procedure Laterality Date  . Right knee replacement      she says it was last year.  . Esophagogastroduodenoscopy Left 11/14/2012    Procedure: ESOPHAGOGASTRODUODENOSCOPY (EGD);  Surgeon: [name], MD;  Location: [facility];  Service: Endoscopy;  Laterality: Left;  . Joint replacement Right     knee  . Parathyroidectomy    . Av fistula placement Right 06/12/2013    Procedure: ARTERIOVENOUS (AV) FISTULA CREATION; RIGHT  BASILIC VEIN TRANSPOSITION with Intraoperative ultrasound;  Surgeon: Mal [name], MD;  Location: [facility] OR;  Service: Vascular;  Laterality: Right;   Family History  Problem Relation Age of Onset  . Diabetes Mother   . Hyperlipidemia Mother   . Hypertension Mother   . Diabetes Father   . Hypertension Father   . Hyperlipidemia Father    Social History  Substance Use Topics  . Smoking status: Current Every Day Smoker -- 2.00 packs/day for 40 years    Types: Cigarettes, Cigars  . Smokeless tobacco: Never Used  . Alcohol Use: No     Comment: none in over a month per daughter   OB History    No data available       Review of Systems  Unable to perform ROS: Psychiatric disorder  pt does not answer ros questions      Allergies  Codeine sulfate; Depakote; Gabapentin; Haldol; Risperidone and related; Trazodone and nefazodone; Invega; and Vistaril  Home Medications   Prior to Admission medications   Medication Sig Start Date End Date Taking? Authorizing Provider  acetaminophen (TYLENOL) 325 MG tablet Take 2 tablets (650 mg total) by mouth every 4 (four) hours as needed for fever, headache or mild pain. 05/20/14   Srikar Janna Arch, MD  albuterol (PROVENTIL HFA;VENTOLIN HFA) 108 (90 BASE) MCG/ACT inhaler Inhale 2 puffs into the lungs every 6 (six) hours as needed for wheezing or shortness of breath. 01/13/15   Clovis Fredrickson, MD  amoxicillin-clavulanate (AUGMENTIN) 500-125 MG per tablet Take 1 tablet (500 mg total) by mouth  daily. 01/29/15   Verlee Monte, MD  Artificial Saliva (BIOTENE ORALBALANCE DRY MOUTH) LIQD Rinse your mouth every 6 hours. 01/29/15   Verlee Monte, MD  bisacodyl (DULCOLAX) 5 MG EC tablet Take 1 tablet (5 mg total) by mouth daily as needed for moderate constipation. Patient not taking: Reported on 10/13/2014 05/20/14   Bynum Bellows, MD  cinacalcet (SENSIPAR) 60 MG tablet Take 60 mg by mouth daily.    Historical Provider, MD  Darbepoetin Alfa (ARANESP) 60 MCG/0.3ML SOSY injection Inject 0.3 mLs (60 mcg total) into the vein every Thursday with hemodialysis. 05/20/14   Bynum Bellows, MD  divalproex (DEPAKOTE ER) 250 MG 24 hr tablet Take 500 mg by mouth 2 (two) times daily. 01/19/15   Historical Provider, MD  docusate sodium 100 MG CAPS Take 100 mg by mouth 2 (two) times daily. Patient not taking: Reported on 10/13/2014 05/20/14   Bynum Bellows, MD  doxepin (SINEQUAN) 25 MG capsule Take 25 mg by mouth at bedtime. 09/12/14   Historical Provider, MD  ferric gluconate 250 mg in sodium chloride 0.9 % 100 mL Inject 250 mg into the vein every Tuesday, Thursday, Saturday, and Sunday. 05/20/14   Srikar Janna Arch, MD  ibuprofen (ADVIL,MOTRIN) 200 MG tablet Take 600 mg by mouth every 6 (six) hours as needed for mild pain, moderate pain or cramping.    Historical Provider, MD  LORazepam (ATIVAN) 0.5 MG tablet Take 0.5 mg by mouth daily as needed for anxiety.    Historical Provider, MD  metoprolol tartrate (LOPRESSOR) 25 MG tablet Take 0.5 tablets (12.5 mg total) by mouth 2 (two) times daily. 01/13/15   Clovis Fredrickson, MD  mometasone-formoterol (DULERA) 100-5 MCG/ACT AERO Inhale 2 puffs into the lungs 2 (two) times daily. 01/13/15   Clovis Fredrickson, MD  Nutritional Supplements (FEEDING SUPPLEMENT, NEPRO CARB STEADY,) LIQD Take 237 mLs by mouth as needed (missed meal during dialysis.). Patient not taking: Reported on 10/13/2014 05/20/14   Bynum Bellows, MD  pantoprazole (PROTONIX) 40 MG tablet Take 1 tablet (40 mg total)  by mouth daily. Patient not taking: Reported on 10/13/2014 08/01/14   Delfina Redwood, MD  simvastatin (ZOCOR) 10 MG tablet Take 10 mg by mouth at bedtime. 01/17/15   Historical Provider, MD  ziprasidone (GEODON) 60 MG capsule Take 1 capsule (60 mg total) by mouth 2 (two) times daily with a meal. For mood control 01/13/15   Jolanta B Pucilowska, MD   BP 142/104 mmHg  Pulse 110  Temp(Src) 98.5 F (36.9 C) (Oral)  Resp 20  SpO2 100%  LMP 05/09/2008 Physical Exam  Constitutional: She appears well-developed and well-nourished. No distress.  HENT:  Head: Atraumatic.  Mouth/Throat: Oropharynx is clear and moist.  Eyes: Conjunctivae are normal. Pupils are equal, round, and reactive to light.  No scleral icterus.  Neck: Normal range of motion. Neck supple. No tracheal deviation present.  No stiffness or rigidity. No bruits.   Cardiovascular: Normal rate, regular rhythm, normal heart sounds and intact distal pulses.  Exam reveals no gallop and no friction rub.   No murmur heard. Pulmonary/Chest: Effort normal and breath sounds normal. No respiratory distress.  Abdominal: Soft. Normal appearance and bowel sounds are normal. She exhibits no distension. There is no tenderness.  Genitourinary:  No cva tenderness  Musculoskeletal: She exhibits no edema or tenderness.  Right upper arm dialysis graft w palp thrill  Neurological: She is alert.  Alert, speech clear/fluent. Ambulates w steady gait.   Skin: Skin is warm and dry. No rash noted. She is not diaphoretic.  Psychiatric:  Pt alert, at times appears worried/paranoid thoughts about who is in hallway.    Nursing note and vitals reviewed.   ED Course  Procedures (including critical care time) Labs Review   Results for orders placed or performed during the hospital encounter of 05/30/15  Comprehensive metabolic panel  Result Value Ref Range   Sodium 138 135 - 145 mmol/L   Potassium 3.6 3.5 - 5.1 mmol/L   Chloride 98 (L) 101 - 111 mmol/L    CO2 24 22 - 32 mmol/L   Glucose, Bld 90 65 - 99 mg/dL   BUN 35 (H) 6 - 20 mg/dL   Creatinine, Ser 4.37 (H) 0.44 - 1.00 mg/dL   Calcium 10.3 8.9 - 10.3 mg/dL   Total Protein 7.7 6.5 - 8.1 g/dL   Albumin 3.5 3.5 - 5.0 g/dL   AST 21 15 - 41 U/L   ALT 16 14 - 54 U/L   Alkaline Phosphatase 152 (H) 38 - 126 U/L   Total Bilirubin 0.6 0.3 - 1.2 mg/dL   GFR calc non Af Amer 10 (L) >60 mL/min   GFR calc Af Amer 12 (L) >60 mL/min   Anion gap 16 (H) 5 - 15  CBC  Result Value Ref Range   WBC 5.0 4.0 - 10.5 K/uL   RBC 4.09 3.87 - 5.11 MIL/uL   Hemoglobin 13.0 12.0 - 15.0 g/dL   HCT 42.4 36.0 - 46.0 %   MCV 103.7 (H) 78.0 - 100.0 fL   MCH 31.8 26.0 - 34.0 pg   MCHC 30.7 30.0 - 36.0 g/dL   RDW 21.6 (H) 11.5 - 15.5 %   Platelets 256 150 - 400 K/uL  I-Stat Troponin, ED (not at [facility])  Result Value Ref Range   Troponin i, poc 0.04 0.00 - 0.08 ng/mL   Comment 3          I-Stat CG4 Lactic Acid, ED  Result Value Ref Range   Lactic Acid, Venous 1.18 0.5 - 2.0 mmol/L      I have personally reviewed and evaluated these lab results as part of my medical decision-making.   EKG Interpretation   Date/Time:  Saturday May 30 2015 14:08:54 EST Ventricular Rate:  106 PR Interval:  200 QRS Duration: 88 QT Interval:  350 QTC Calculation: 464 R Axis:   99 Text Interpretation:  Sinus tachycardia Rightward axis Poor data quality  No significant change since last tracing Confirmed by Ashok Cordia  MD, Lennette Bihari  ([id number]) on 05/30/2015 4:54:41 PM      MDM   Labs.  Reviewed nursing notes and prior charts for additional history.   Psych team consulted.  Recheck pt awake and alert, content.  [facility] evaluation pending.  Disposition per [facility]  team.  Psych team is recommending inpatient psychiatric treatment, placement is pending at this time.  I discussed patient with [facility] doctor on call, Dr Jonnie Finner, including that patient is awaiting psych placement, and that was last dialyzed yesterday, in order  that dialysis be arranged if patient spends prolonged time in ED awaiting psychiatric bed (also informed him that we could not send patient to outpatient dialysis while she is an ED patient awaiting  inpatient psych bed).   Pt does not require emergent HD tonight, but suspect will need dialysis in the next 1-2 days.   Will sign out to oncoming provider to f/u on pts HD needs.   0049, discussed w oncoming doc, Dr Venora Maples, to pass on plan that [facility] placement pending, will need HD in next 1-2 days, and ongoing, if remains in ED.         Lajean Saver, MD 05/31/15 [phone]

## 2015-05-31 DIAGNOSIS — F319 Bipolar disorder, unspecified: Secondary | ICD-10-CM | POA: Diagnosis not present

## 2015-05-31 LAB — CBC WITH DIFFERENTIAL/PLATELET
Basophils Absolute: 0 10*3/uL (ref 0.0–0.1)
Basophils Relative: 1 %
Eosinophils Absolute: 0.1 10*3/uL (ref 0.0–0.7)
Eosinophils Relative: 3 %
HCT: 37.6 % (ref 36.0–46.0)
Hemoglobin: 11.6 g/dL — ABNORMAL LOW (ref 12.0–15.0)
Lymphocytes Relative: 40 %
Lymphs Abs: 1.7 10*3/uL (ref 0.7–4.0)
MCH: 31.7 pg (ref 26.0–34.0)
MCHC: 30.9 g/dL (ref 30.0–36.0)
MCV: 102.7 fL — ABNORMAL HIGH (ref 78.0–100.0)
Monocytes Absolute: 0.7 10*3/uL (ref 0.1–1.0)
Monocytes Relative: 16 %
Neutro Abs: 1.7 10*3/uL (ref 1.7–7.7)
Neutrophils Relative %: 40 %
Platelets: 209 10*3/uL (ref 150–400)
RBC: 3.66 MIL/uL — ABNORMAL LOW (ref 3.87–5.11)
RDW: 21.2 % — ABNORMAL HIGH (ref 11.5–15.5)
WBC: 4.2 10*3/uL (ref 4.0–10.5)

## 2015-05-31 LAB — COMPREHENSIVE METABOLIC PANEL
ALT: 11 U/L — ABNORMAL LOW (ref 14–54)
AST: 16 U/L (ref 15–41)
Albumin: 2.8 g/dL — ABNORMAL LOW (ref 3.5–5.0)
Alkaline Phosphatase: 116 U/L (ref 38–126)
Anion gap: 14 (ref 5–15)
BUN: 54 mg/dL — ABNORMAL HIGH (ref 6–20)
CO2: 24 mmol/L (ref 22–32)
Calcium: 8.2 mg/dL — ABNORMAL LOW (ref 8.9–10.3)
Chloride: 100 mmol/L — ABNORMAL LOW (ref 101–111)
Creatinine, Ser: 5.05 mg/dL — ABNORMAL HIGH (ref 0.44–1.00)
GFR calc Af Amer: 10 mL/min — ABNORMAL LOW (ref >60)
GFR calc non Af Amer: 9 mL/min — ABNORMAL LOW (ref >60)
Glucose, Bld: 88 mg/dL (ref 65–99)
Potassium: 3.9 mmol/L (ref 3.5–5.1)
Sodium: 138 mmol/L (ref 135–145)
Total Bilirubin: 0.6 mg/dL (ref 0.3–1.2)
Total Protein: 6.3 g/dL — ABNORMAL LOW (ref 6.5–8.1)

## 2015-05-31 LAB — LIPASE, BLOOD: Lipase: 43 U/L (ref 11–51)

## 2015-05-31 MED ORDER — ACETAMINOPHEN 500 MG PO TABS
1000.0000 mg | ORAL_TABLET | Freq: Four times a day (QID) | ORAL | Status: DC | PRN
  Administered 2015-05-31 – 2015-06-01 (×2): 1000 mg via ORAL
  Filled 2015-05-31 (×2): qty 2

## 2015-05-31 MED ORDER — NICOTINE 21 MG/24HR TD PT24
21.0000 mg | MEDICATED_PATCH | Freq: Every day | TRANSDERMAL | Status: DC
  Administered 2015-05-31 – 2015-06-01 (×2): 21 mg via TRANSDERMAL
  Filled 2015-05-31 (×2): qty 1

## 2015-05-31 NOTE — ED Notes (Signed)
Pt in hall, stating she wants to smoke or that she wants to go home.  This RN explained reasons for allowing [place] to care for her, and patient has been reminded multiple times about our rules of not being the hall after 10pm.  Patient is argumentative with RN.  Patient not able to remember that she has met this RN before.  Patient only chooses to answer questions she is comfortable with.  This RN accompanied patient back to room and patient states "I guess I'll just sit in here and keep looking stupid".  This RN offered comfort measures, and offered to speak to patient.  Patient does not answer RN.  RN left room.

## 2015-05-31 NOTE — ED Notes (Signed)
Pt reassured. Pt tearful. Pt placed back in the bed and given warm blanket. Tray was reordered for patient with service response. Pt waiting for food

## 2015-05-31 NOTE — ED Notes (Signed)
The pt is asking for food.  She just vomited up food in the past 2 hours.  She is not happy  i gave her ice chips  She is know c/o abd pain  And she  Wants her 02 to sleep  No order

## 2015-05-31 NOTE — ED Notes (Signed)
Patient continues to argue with RN about her medications and states she wants to be given a med list.  This RN reminded her that pharmacy had been around earlier to review medications with her.  Patient agitated, will no longer speak to RN.  This RN encouraged patient to try to get some sleep, and patient stated "thank you for nothing" as this RN walked out of room.

## 2015-05-31 NOTE — ED Notes (Addendum)
Pt pacing back and forth. Pt reassured that we tried to call her daughter. Pt made aware that she is staying her to have her Depakote levels checked and wait for Dialysis tomorrow. Pt states, "My daughter doesn't know that I am here. She wouldn't leave me here." Pt reassured that her daughter is aware. Pt states, I am hungry and I need to eat. Pt asked several times what she would like to eat. Pt has flight of ideas. Pt is unable to answer what she would like to eat. Meal tray was ordered and when received, pt reports that she does not want the food. When patient questioned about what medication she would like, patient does not answer and says no to every option.

## 2015-05-31 NOTE — ED Notes (Signed)
The pt has finally gone to sleep

## 2015-05-31 NOTE — ED Notes (Signed)
Pt speaking with daughter on the phone

## 2015-05-31 NOTE — ED Notes (Signed)
Pt was up, walked in the halls.  Patient unhappy with dinner.  Patient put herself back in bed and is covered up at this time.

## 2015-05-31 NOTE — ED Notes (Signed)
Pt stated she was hungry and she didn't like any of the food given her. Pt reassured we would order her a salad per Pt's request. Pt at the desk.

## 2015-05-31 NOTE — ED Notes (Signed)
Let MD Viviana Simpler made aware of patient stating, "I do not feel well, I have not been making urine, my ovaries hurt, my back hurts, and my thighs hurt. Something isn't okay." Pt reassured and assessed. MD verbalized understanding

## 2015-05-31 NOTE — ED Notes (Signed)
Pt using the phone. Pt speaking with family on the phone. Pt reassured that pt is to have Depakote levels checked, and dialysis tomorrow. Pt agitated, but cooperative.

## 2015-05-31 NOTE — ED Notes (Signed)
Pt c/o a headache still ice pack given.  She also wanted to have her inhaler  Given 2 puffs.  She is not sob but she insists that she needs 02 to sleep

## 2015-05-31 NOTE — ED Notes (Signed)
Pt requesting something for a stuffy nose.  Nothing available

## 2015-05-31 NOTE — ED Notes (Signed)
The pt is not sleeping  She now wants a pillow.  Given reminded her to go to sleep

## 2015-05-31 NOTE — ED Notes (Signed)
Pt has eaten multiple meal trays, but continues to state she has not eaten.  Pt provided with graham crackers and peanut butter as supplement for medications that need to be taken with food.

## 2015-06-01 ENCOUNTER — Inpatient Hospital Stay
Admission: EM | Admit: 2015-06-01 | Discharge: 2015-06-08 | DRG: 885 | Disposition: A | Discharge status: Home / Self Care | Payer: 59 | Source: Intra-hospital | Attending: Psychiatry | Admitting: Psychiatry

## 2015-06-01 DIAGNOSIS — G4700 Insomnia, unspecified: Secondary | ICD-10-CM | POA: Diagnosis present

## 2015-06-01 DIAGNOSIS — Z8249 Family history of ischemic heart disease and other diseases of the circulatory system: Secondary | ICD-10-CM | POA: Diagnosis not present

## 2015-06-01 DIAGNOSIS — Z9119 Patient's noncompliance with other medical treatment and regimen: Secondary | ICD-10-CM

## 2015-06-01 DIAGNOSIS — E8881 Metabolic syndrome: Secondary | ICD-10-CM | POA: Diagnosis present

## 2015-06-01 DIAGNOSIS — Z833 Family history of diabetes mellitus: Secondary | ICD-10-CM

## 2015-06-01 DIAGNOSIS — J449 Chronic obstructive pulmonary disease, unspecified: Secondary | ICD-10-CM | POA: Diagnosis present

## 2015-06-01 DIAGNOSIS — Z96651 Presence of right artificial knee joint: Secondary | ICD-10-CM | POA: Diagnosis present

## 2015-06-01 DIAGNOSIS — F17200 Nicotine dependence, unspecified, uncomplicated: Secondary | ICD-10-CM | POA: Diagnosis present

## 2015-06-01 DIAGNOSIS — D631 Anemia in chronic kidney disease: Secondary | ICD-10-CM | POA: Diagnosis present

## 2015-06-01 DIAGNOSIS — F17210 Nicotine dependence, cigarettes, uncomplicated: Secondary | ICD-10-CM | POA: Diagnosis present

## 2015-06-01 DIAGNOSIS — N186 End stage renal disease: Secondary | ICD-10-CM | POA: Diagnosis present

## 2015-06-01 DIAGNOSIS — K219 Gastro-esophageal reflux disease without esophagitis: Secondary | ICD-10-CM | POA: Diagnosis present

## 2015-06-01 DIAGNOSIS — I959 Hypotension, unspecified: Secondary | ICD-10-CM | POA: Diagnosis present

## 2015-06-01 DIAGNOSIS — F312 Bipolar disorder, current episode manic severe with psychotic features: Principal | ICD-10-CM | POA: Diagnosis present

## 2015-06-01 DIAGNOSIS — Z886 Allergy status to analgesic agent status: Secondary | ICD-10-CM | POA: Diagnosis not present

## 2015-06-01 DIAGNOSIS — E210 Primary hyperparathyroidism: Secondary | ICD-10-CM | POA: Diagnosis present

## 2015-06-01 DIAGNOSIS — Z915 Personal history of self-harm: Secondary | ICD-10-CM

## 2015-06-01 DIAGNOSIS — Z992 Dependence on renal dialysis: Secondary | ICD-10-CM | POA: Diagnosis not present

## 2015-06-01 DIAGNOSIS — E1122 Type 2 diabetes mellitus with diabetic chronic kidney disease: Secondary | ICD-10-CM | POA: Diagnosis present

## 2015-06-01 DIAGNOSIS — E892 Postprocedural hypoparathyroidism: Secondary | ICD-10-CM | POA: Diagnosis present

## 2015-06-01 DIAGNOSIS — N2581 Secondary hyperparathyroidism of renal origin: Secondary | ICD-10-CM | POA: Diagnosis present

## 2015-06-01 DIAGNOSIS — I120 Hypertensive chronic kidney disease with stage 5 chronic kidney disease or end stage renal disease: Secondary | ICD-10-CM | POA: Diagnosis present

## 2015-06-01 DIAGNOSIS — Z888 Allergy status to other drugs, medicaments and biological substances status: Secondary | ICD-10-CM | POA: Diagnosis not present

## 2015-06-01 DIAGNOSIS — Z9889 Other specified postprocedural states: Secondary | ICD-10-CM | POA: Diagnosis not present

## 2015-06-01 DIAGNOSIS — F319 Bipolar disorder, unspecified: Secondary | ICD-10-CM | POA: Diagnosis not present

## 2015-06-01 LAB — RAPID URINE DRUG SCREEN, HOSP PERFORMED
Amphetamines: NOT DETECTED
Barbiturates: NOT DETECTED
Benzodiazepines: NOT DETECTED
Cocaine: NOT DETECTED
Opiates: NOT DETECTED
Tetrahydrocannabinol: NOT DETECTED

## 2015-06-01 MED ORDER — PANTOPRAZOLE SODIUM 40 MG PO TBEC
40.0000 mg | DELAYED_RELEASE_TABLET | Freq: Every day | ORAL | Status: DC
  Administered 2015-06-01 – 2015-06-08 (×8): 40 mg via ORAL
  Filled 2015-06-01 (×8): qty 1

## 2015-06-01 MED ORDER — SEVELAMER CARBONATE 800 MG PO TABS
800.0000 mg | ORAL_TABLET | Freq: Three times a day (TID) | ORAL | Status: DC
  Administered 2015-06-02 – 2015-06-08 (×17): 800 mg via ORAL
  Filled 2015-06-01 (×21): qty 1

## 2015-06-01 MED ORDER — NICOTINE 21 MG/24HR TD PT24
21.0000 mg | MEDICATED_PATCH | Freq: Every day | TRANSDERMAL | Status: DC
  Administered 2015-06-02 – 2015-06-08 (×5): 21 mg via TRANSDERMAL
  Filled 2015-06-01 (×5): qty 1

## 2015-06-01 MED ORDER — METOPROLOL TARTRATE 25 MG PO TABS
25.0000 mg | ORAL_TABLET | Freq: Two times a day (BID) | ORAL | Status: DC
  Administered 2015-06-01 – 2015-06-08 (×14): 25 mg via ORAL
  Filled 2015-06-01 (×14): qty 1

## 2015-06-01 MED ORDER — ZIPRASIDONE HCL 20 MG PO CAPS
80.0000 mg | ORAL_CAPSULE | Freq: Two times a day (BID) | ORAL | Status: DC
  Administered 2015-06-02 – 2015-06-08 (×13): 80 mg via ORAL
  Filled 2015-06-01 (×13): qty 4

## 2015-06-01 MED ORDER — LORAZEPAM 2 MG PO TABS
2.0000 mg | ORAL_TABLET | ORAL | Status: DC | PRN
  Administered 2015-06-02 – 2015-06-07 (×7): 2 mg via ORAL
  Filled 2015-06-01 (×9): qty 1

## 2015-06-01 MED ORDER — BISACODYL 5 MG PO TBEC: 5.0000 mg | DELAYED_RELEASE_TABLET | Freq: Every day | ORAL | Status: DC | PRN

## 2015-06-01 MED ORDER — ACETAMINOPHEN 325 MG PO TABS
650.0000 mg | ORAL_TABLET | Freq: Four times a day (QID) | ORAL | Status: DC | PRN
  Administered 2015-06-02 – 2015-06-08 (×5): 650 mg via ORAL
  Filled 2015-06-01 (×5): qty 2

## 2015-06-01 MED ORDER — MAGNESIUM HYDROXIDE 400 MG/5ML PO SUSP: 30.0000 mL | Freq: Every day | ORAL | Status: DC | PRN

## 2015-06-01 MED ORDER — MOMETASONE FURO-FORMOTEROL FUM 100-5 MCG/ACT IN AERO
2.0000 | INHALATION_SPRAY | Freq: Two times a day (BID) | RESPIRATORY_TRACT | Status: DC
  Administered 2015-06-01 – 2015-06-08 (×13): 2 via RESPIRATORY_TRACT
  Filled 2015-06-01 (×3): qty 8.8

## 2015-06-01 MED ORDER — LORAZEPAM 2 MG/ML IJ SOLN: 2.0000 mg | INTRAMUSCULAR | Status: DC | PRN

## 2015-06-01 MED ORDER — CINACALCET HCL 30 MG PO TABS
60.0000 mg | ORAL_TABLET | Freq: Two times a day (BID) | ORAL | Status: DC
  Administered 2015-06-02 – 2015-06-08 (×12): 60 mg via ORAL
  Filled 2015-06-01 (×15): qty 2

## 2015-06-01 MED ORDER — DIVALPROEX SODIUM ER 500 MG PO TB24
750.0000 mg | ORAL_TABLET | Freq: Every day | ORAL | Status: DC
  Administered 2015-06-01 – 2015-06-07 (×7): 750 mg via ORAL
  Filled 2015-06-01 (×10): qty 1

## 2015-06-01 MED ORDER — DOXEPIN HCL 25 MG PO CAPS
25.0000 mg | ORAL_CAPSULE | Freq: Every day | ORAL | Status: DC
  Administered 2015-06-01 – 2015-06-07 (×7): 25 mg via ORAL
  Filled 2015-06-01 (×8): qty 1

## 2015-06-01 MED ORDER — DOCUSATE SODIUM 100 MG PO CAPS
100.0000 mg | ORAL_CAPSULE | Freq: Two times a day (BID) | ORAL | Status: DC
  Administered 2015-06-01 – 2015-06-08 (×14): 100 mg via ORAL
  Filled 2015-06-01 (×14): qty 1

## 2015-06-01 MED ORDER — ALUM & MAG HYDROXIDE-SIMETH 200-200-20 MG/5ML PO SUSP: 30.0000 mL | ORAL | Status: DC | PRN

## 2015-06-01 MED ORDER — ALBUTEROL SULFATE HFA 108 (90 BASE) MCG/ACT IN AERS
2.0000 | INHALATION_SPRAY | RESPIRATORY_TRACT | Status: DC | PRN
  Filled 2015-06-01: qty 6.7

## 2015-06-01 MED ORDER — ZIPRASIDONE MESYLATE 20 MG IM SOLR
20.0000 mg | INTRAMUSCULAR | Status: DC | PRN
  Administered 2015-06-02: 20 mg via INTRAMUSCULAR
  Filled 2015-06-01: qty 20

## 2015-06-01 NOTE — ED Notes (Signed)
Pt became very agitated and would not leave RN station while in report; Merchant navy officer notified; Pt starts yelling "It's not fair, and I want to go home"; Pt's daughter called with no answer;

## 2015-06-01 NOTE — Progress Notes (Signed)
RN Janie aware pt has been accepted at [facility].  Verlon Setting, [place] Disposition staff 06/01/2015 4:07 PM

## 2015-06-01 NOTE — ED Notes (Addendum)
Pt at the nursing station arguing with staff, non compliant with directions and refusing to go back into her room.  Pt demanding to go home.  MD notified and IVC papers started.

## 2015-06-01 NOTE — Progress Notes (Addendum)
Pt accepted to [facility] bed 313 by Dr. Bary Leriche. Pt is under IVC and can arrive anytime. Number for RN report is [phone].  Sharren Bridge, MSW, LCSW Clinical Social Work, Disposition  06/01/2015 [phone]

## 2015-06-01 NOTE — ED Notes (Signed)
Pt. Informed sitter that she had headache. RN to bedside. Pt. Requesting medication.

## 2015-06-01 NOTE — ED Notes (Signed)
Pt called out.  States her eyes are itchy, as well as her nose.  Requested her inhaler.  2 puffs of inhaler provided.

## 2015-06-01 NOTE — ED Notes (Signed)
IVC paper's faxed to [place] per Jackelyn Poling, Market researcher confirmed receiving papers.

## 2015-06-01 NOTE — ED Provider Notes (Signed)
Patient minimally directable, not compliant with medications, and trying to leave.    But, on several subsequent observations, she is calm, resting in bed.  HD today and placement pending.  Patient continues to attempt to elope.  She requires psych stabilization, and per their rec, she will have IVC papers initiated.   Carmin Muskrat, MD 06/01/15 1139

## 2015-06-01 NOTE — Progress Notes (Signed)
55 yrs old female admitted with Bipolar 1 with psychotic features.Patient refused to come in the unit.States "I am scared,why I am here."Got the patient in the with lots of encouragement.Skin assessment & body search done.No contraband found on admission.

## 2015-06-01 NOTE — ED Notes (Signed)
Patient was given a snack and drink, A regular diet ordered for lunch. 

## 2015-06-01 NOTE — ED Notes (Signed)
Pt trying to exit hospital, This RN escorted pt back to room.  Pt appears confused and not understanding while she is here or where her daughter has been for the last 3 days.

## 2015-06-01 NOTE — ED Notes (Signed)
Patient was given some gram crackers and peanut butter.

## 2015-06-01 NOTE — ED Notes (Signed)
Pt. To nurses station with sitter to make phone call. Pt. Walking down hallway, tearful, stating she "does not want to be here." Pt. Attempting to leave. Pt. Redirected by sitter and NT. Security at bedside to help redirect pt. Pt. Resting comfortably in recliner at this time.

## 2015-06-01 NOTE — ED Notes (Signed)
[employer] served pt commitment papers.

## 2015-06-01 NOTE — ED Provider Notes (Signed)
This is a 55 year old female with bipolar affective disorder who is on dialysis. She is to be transferred to [facility] for behavioral health treatment. IVC papers are in place.Patient maintained hemodynamically stable with no evidence of volume overload. Last dialysis on January 20 his potassium obtained yesterday at 3.9 patient appears reasonably stable for transfer.   Pattricia Boss, MD 06/01/15 [phone]

## 2015-06-01 NOTE — ED Notes (Signed)
[employer] called for transport 

## 2015-06-02 DIAGNOSIS — F312 Bipolar disorder, current episode manic severe with psychotic features: Principal | ICD-10-CM

## 2015-06-02 LAB — CBC
HCT: 38.2 % (ref 35.0–47.0)
Hemoglobin: 12 g/dL (ref 12.0–16.0)
MCH: 31.3 pg (ref 26.0–34.0)
MCHC: 31.5 g/dL — ABNORMAL LOW (ref 32.0–36.0)
MCV: 99.5 fL (ref 80.0–100.0)
Platelets: 209 10*3/uL (ref 150–440)
RBC: 3.84 MIL/uL (ref 3.80–5.20)
RDW: 21.8 % — ABNORMAL HIGH (ref 11.5–14.5)
WBC: 5 10*3/uL (ref 3.6–11.0)

## 2015-06-02 LAB — RENAL FUNCTION PANEL
Albumin: 3.1 g/dL — ABNORMAL LOW (ref 3.5–5.0)
Anion gap: 10 (ref 5–15)
BUN: 71 mg/dL — ABNORMAL HIGH (ref 6–20)
CO2: 24 mmol/L (ref 22–32)
Calcium: 9.1 mg/dL (ref 8.9–10.3)
Chloride: 103 mmol/L (ref 101–111)
Creatinine, Ser: 4.93 mg/dL — ABNORMAL HIGH (ref 0.44–1.00)
GFR calc Af Amer: 10 mL/min — ABNORMAL LOW (ref >60)
GFR calc non Af Amer: 9 mL/min — ABNORMAL LOW (ref >60)
Glucose, Bld: 78 mg/dL (ref 65–99)
Phosphorus: 6.5 mg/dL — ABNORMAL HIGH (ref 2.5–4.6)
Potassium: 4.2 mmol/L (ref 3.5–5.1)
Sodium: 137 mmol/L (ref 135–145)

## 2015-06-02 MED ORDER — LORAZEPAM 2 MG/ML IJ SOLN
2.0000 mg | Freq: Once | INTRAMUSCULAR | Status: AC
  Administered 2015-06-02: 2 mg via INTRAVENOUS
  Filled 2015-06-02: qty 1

## 2015-06-02 NOTE — Progress Notes (Signed)
Recreation Therapy Notes  Date: 01.24.17 Time: 3:00 pm Location: Craft Room  Group Topic: Goal Setting  Goal Area(s) Addresses:  Patient will write at least one goal. Patient will write at least one obstacle.  Behavioral Response: Did not attend  Intervention: Recovery Goal Chart  Activity: Patients were instructed to make a Recovery Goal Chart including goals, obstacles, the date they started working on their goals, and the date they achieved their goal.  Education: LRT educated patients on healthy ways they can celebrate reaching their goals.  Education Outcome: Patient did not attend group.   Clinical Observations/Feedback: Patient did not attend group.  Leonette Monarch, LRT/CTRS 06/02/2015 4:18 PM

## 2015-06-02 NOTE — BHH Group Notes (Signed)
[facility] Group Notes:  (Nursing/MHT/Case Management/Adjunct)  Date:  06/02/2015  Time:  1:42 PM  Type of Therapy:  Psychoeducational Skills  Participation Level:  Did Not Attend   Lorane Gell 06/02/2015, 1:42 PM

## 2015-06-02 NOTE — Progress Notes (Signed)
[facility]  ROUNDING NOTE   Subjective:   Seen and examined on hemodialysis. Tolerating treatment well.  Engineer, site at [employer].   Objective:  Vital signs in last 24 hours:  Temp:  [97.7 F (36.5 C)-98.7 F (37.1 C)] 98.4 F (36.9 C) (01/24 1040) Pulse Rate:  [60-88] 77 (01/24 1124) Resp:  [16-20] 16 (01/24 1124) BP: (101-141)/(59-103) 129/89 mmHg (01/24 1124) SpO2:  [94 %-98 %] 98 % (01/23 1828) Weight:  [82.555 kg (182 lb)] 82.555 kg (182 lb) (01/24 1040)  Weight change:  Filed Weights   06/01/15 1828 06/02/15 1040  Weight: 82.555 kg (182 lb) 82.555 kg (182 lb)    Intake/Output:     Intake/Output this shift:     Physical Exam: General: NAD  Head: Normocephalic, atraumatic. Moist oral mucosal membranes  Eyes: Anicteric, PERRL  Neck: Supple, trachea midline  Lungs:  Clear to auscultation  Heart: Regular rate and rhythm  Abdomen:  Soft, nontender,   Extremities: no peripheral edema.  Neurologic: Nonfocal, moving all four extremities  Skin: No lesions  Access: Right AVF    Basic Metabolic Panel:  Recent Labs Lab 05/30/15 1506 05/31/15 2055  NA 138 138  K 3.6 3.9  CL 98* 100*  CO2 24 24  GLUCOSE 90 88  BUN 35* 54*  CREATININE 4.37* 5.05*  CALCIUM 10.3 8.2*    Liver Function Tests:  Recent Labs Lab 05/30/15 1506 05/31/15 2055  AST 21 16  ALT 16 11*  ALKPHOS 152* 116  BILITOT 0.6 0.6  PROT 7.7 6.3*  ALBUMIN 3.5 2.8*    Recent Labs Lab 05/31/15 2055  LIPASE 43   No results for input(s): AMMONIA in the last 168 hours.  CBC:  Recent Labs Lab 05/30/15 1506 05/31/15 2055  WBC 5.0 4.2  NEUTROABS  --  1.7  HGB 13.0 11.6*  HCT 42.4 37.6  MCV 103.7* 102.7*  PLT 256 209    Cardiac Enzymes: No results for input(s): CKTOTAL, CKMB, CKMBINDEX, TROPONINI in the last 168 hours.  BNP: Invalid input(s): POCBNP  CBG: No results for input(s): GLUCAP in the last 168 hours.  Microbiology: Results for  orders placed or performed during the hospital encounter of 01/26/15  MRSA PCR Screening     Status: None   Collection Time: 01/26/15 11:28 PM  Result Value Ref Range Status   MRSA by PCR NEGATIVE NEGATIVE Final    Comment:        The GeneXpert MRSA Assay (FDA approved for NASAL specimens only), is one component of a comprehensive MRSA colonization surveillance program. It is not intended to diagnose MRSA infection nor to guide or monitor treatment for MRSA infections.     Coagulation Studies: No results for input(s): LABPROT, INR in the last 72 hours.  Urinalysis: No results for input(s): COLORURINE, LABSPEC, PHURINE, GLUCOSEU, HGBUR, BILIRUBINUR, KETONESUR, PROTEINUR, UROBILINOGEN, NITRITE, LEUKOCYTESUR in the last 72 hours.  Invalid input(s): APPERANCEUR    Imaging: No results found.   Medications:     . cinacalcet  60 mg Oral BID WC  . divalproex  750 mg Oral QHS  . docusate sodium  100 mg Oral BID  . doxepin  25 mg Oral QHS  . metoprolol tartrate  25 mg Oral BID  . mometasone-formoterol  2 puff Inhalation BID  . nicotine  21 mg Transdermal Q0600  . pantoprazole  40 mg Oral Daily  . sevelamer carbonate  800 mg Oral TID WC  . ziprasidone  80 mg Oral  BID WC   acetaminophen, albuterol, alum & mag hydroxide-simeth, bisacodyl, LORazepam **OR** LORazepam, magnesium hydroxide, ziprasidone  Assessment/ Plan:  Jennifer Lawson is a 55 y.o. black female with ESRD on HD since 1/16 followed by [facility], COPD, secondary hyperparathyroidism, anemia of CKD, bipolar affective disorder  1. ESRD on HD TTS: seen and examined on hemodialysis. Tolerating treatment well.  - Continue TTS schedule.   2. Anemia of CKD: hemoglobin 11.6 - holding epo.   3. Secondary hyperparathyroidism of renal origin:  - sevelamer for binding.   4. Hypertension: On metoprolol. Well controlled.   LOS: [place], [place] 1/24/201711:38 AM

## 2015-06-02 NOTE — Progress Notes (Signed)
D:  Patient is alert and questionably oriented on the unit this shift.  Patient did not atttend groups.  Patient slept until being awakened to take medications and go to dialysis.   A:  Scheduled medications were administered to patient per MD orders.  Emotional support and encouragement were provided.  Patient was maintained on q.15 minute safety checks.  Patient was informed to notify staff with any questions or concerns. R:  No adverse medication reactions noted.  Patient was cooperative with medication administration.  Patient remains safe at this time.

## 2015-06-02 NOTE — Plan of Care (Signed)
Problem: Ineffective individual coping Goal: STG: Patient will remain free from self harm Outcome: Progressing Patient remains free from self harm on the unit this shift

## 2015-06-02 NOTE — BHH Group Notes (Signed)
[facility] LCSW Group Therapy  06/02/2015 4:55 PM  Type of Therapy:  Group Therapy  Participation Level:  Did Not Attend    August Saucer, MSW, LCSW 06/02/2015, 4:55 PM

## 2015-06-02 NOTE — Plan of Care (Signed)
Problem: Alteration in mood; excessive anxiety as evidenced by: Goal: LTG-Patient's behavior demonstrates decreased anxiety (Patient's behavior demonstrates anxiety and he/she is utilizing learned coping skills to deal with anxiety-producing situations)  Outcome: Progressing Patient displayed less anxiety on the unit this a.m.

## 2015-06-02 NOTE — Tx Team (Signed)
Initial Interdisciplinary Treatment Plan   PATIENT STRESSORS: Financial difficulties Health problems Medication change or noncompliance   PATIENT STRENGTHS: Religious Affiliation Supportive family/friends   PROBLEM LIST: Problem List/Patient Goals Date to be addressed Date deferred Reason deferred Estimated date of resolution  Medication compliance.  06/02/2015     Psychosis 06/02/2015     Agitation 06/02/2015                                          DISCHARGE CRITERIA:  Improved stabilization in mood, thinking, and/or behavior Medical problems require only outpatient monitoring Reduction of life-threatening or endangering symptoms to within safe limits  PRELIMINARY DISCHARGE PLAN: Return to previous living arrangement  PATIENT/FAMIILY INVOLVEMENT: This treatment plan has been presented to and reviewed with the patient, Jennifer Lawson, and/or family member.  The patient and family have been given the opportunity to ask questions and make suggestions.  Pete Glatter 06/02/2015, 3:55 AM

## 2015-06-02 NOTE — Progress Notes (Signed)
Pre-hd tx 

## 2015-06-02 NOTE — BHH Suicide Risk Assessment (Signed)
[facility] Admission Suicide Risk Assessment   Nursing information obtained from:    Demographic factors:    Current Mental Status:    Loss Factors:    Historical Factors:    Risk Reduction Factors:     Total Time spent with patient: 1 hour Principal Problem: <principal problem not specified> Diagnosis:   Patient Active Problem List   Diagnosis Date Noted  . Bipolar I disorder, most recent episode manic, severe with psychotic features ([place]) [F31.2] 06/01/2015  . Change in mental status [R41.82] 01/26/2015  . Anemia of renal disease [D63.1] 01/26/2015  . Pain in the chest [R07.9]   . Pleuritic chest pain [R07.81] 07/31/2014  . Symptomatic anemia [D64.9] 07/29/2014  . Absolute anemia [D64.9]   . CKD (chronic kidney disease) stage V requiring chronic dialysis ([place]) [N18.6, Z99.2] 05/13/2014  . Salicylate overdose [id number] 05/13/2014  . ESRD (end stage renal disease) ([place]) [N18.6] 01/16/2014  . COPD (chronic obstructive pulmonary disease) ([place]) [J44.9] 09/26/2013  . COPD exacerbation ([place]) [J44.1] 04/24/2013  . Hyperkalemia [E87.5] 04/23/2013  . Acute respiratory failure ([place]) [J96.00] 04/23/2013  . Hypercalcemia [E83.52] 02/20/2013  . Hyperparathyroidism, primary ([place]) [E21.0] 02/20/2013  . Tobacco use disorder [F17.200] 11/13/2012  . Overdose of salicylate A999333 [id number]  . HTN (hypertension) [I10] 02/20/2012   Subjective Data: psychotic agitation.  Continued Clinical Symptoms:  Alcohol Use Disorder Identification Test Final Score (AUDIT): 0 The "Alcohol Use Disorders Identification Test", Guidelines for Use in Primary Care, Second Edition.  World Pharmacologist [facility]). Score between 0-7:  no or low risk or alcohol related problems. Score between 8-15:  moderate risk of alcohol related problems. Score between 16-19:  high risk of alcohol related problems. Score 20 or above:  warrants further diagnostic evaluation for alcohol dependence and treatment.   CLINICAL  FACTORS:   Bipolar Disorder:   Mixed State   Musculoskeletal: Strength & Muscle Tone: within normal limits Gait & Station: normal Patient leans: N/A  Psychiatric Specialty Exam: Review of Systems  All other systems reviewed and are negative.   Blood pressure 102/67, pulse 70, temperature 98.1 F (36.7 C), temperature source Oral, resp. rate 16, height 5\' 2"  (1.575 m), weight 82.555 kg (182 lb), last menstrual period 05/09/2008, SpO2 98 %.Body mass index is 33.28 kg/(m^2).  General Appearance: Disheveled  Eye Contact::  Minimal  Speech:  Slurred  Volume:  Decreased  Mood:  Depressed  Affect:  Blunt  Thought Process:  Disorganized  Orientation:  Full (Time, Place, and Person)  Thought Content:  Delusions and Paranoid Ideation  Suicidal Thoughts:  No  Homicidal Thoughts:  No  Memory:  Immediate;   Fair Recent;   Fair Remote;   Fair  Judgement:  Poor  Insight:  Lacking  Psychomotor Activity:  Increased  Concentration:  Fair  Recall:  [employer] of Knowledge:Fair  Language: Fair  Akathisia:  No  Handed:  Right  AIMS (if indicated):     Assets:  Communication Skills Desire for Improvement Financial Resources/Insurance Housing Resilience Social Support  Sleep:  Number of Hours: 4.5  Cognition: WNL  ADL's:  Intact    COGNITIVE FEATURES THAT CONTRIBUTE TO RISK:  None    SUICIDE RISK:   Minimal: No identifiable suicidal ideation.  Patients presenting with no risk factors but with morbid ruminations; may be classified as minimal risk based on the severity of the depressive symptoms  PLAN OF CARE: Hospital admission, medication mangment, nephrology consult, dialysis, discharge planning.  Miss Yantz is a 55 year old female with  a history of bipolar illness and end-stage kidney disease on dialysis admitted for a manic episode in the context of treatment noncompliance.  1. Bipolar mania. We restarted Depakote and Geodon for psychosis and mood stabilization.  2.  Kidney disease. Nephrology consult is greatly appreciated. The patient will continue on dialysis.  3. COPD. We continue inhalers.  4. Hypertension. She is on metoprolol.  5. GERD. She is on Protonix.  6. Smoking. Nicotine patch is available.  7. Insomnia. We'll offer trazodone.  8. Disposition. She will be discharged to home with her daughter. She will follow up with her regular psychiatrist and nephrologist.  I certify that inpatient services furnished can reasonably be expected to improve the patient's condition.   Orson Slick, MD 06/02/2015, 4:17 PM

## 2015-06-02 NOTE — H&P (Signed)
Psychiatric Admission Assessment Adult  Patient Identification: Jennifer Lawson MRN:  [id number] Date of Evaluation:  06/02/2015 Chief Complaint:  Bipolar with Psychotic features Principal Diagnosis: <principal problem not specified> Diagnosis:   Patient Active Problem List   Diagnosis Date Noted  . Bipolar I disorder, most recent episode manic, severe with psychotic features ([place]) [F31.2] 06/01/2015  . Change in mental status [R41.82] 01/26/2015  . Anemia of renal disease [D63.1] 01/26/2015  . Pain in the chest [R07.9]   . Pleuritic chest pain [R07.81] 07/31/2014  . Symptomatic anemia [D64.9] 07/29/2014  . Absolute anemia [D64.9]   . CKD (chronic kidney disease) stage V requiring chronic dialysis ([place]) [N18.6, Z99.2] 05/13/2014  . Salicylate overdose [W38.937D] 05/13/2014  . ESRD (end stage renal disease) ([place]) [N18.6] 01/16/2014  . COPD (chronic obstructive pulmonary disease) ([place]) [J44.9] 09/26/2013  . COPD exacerbation ([place]) [J44.1] 04/24/2013  . Hyperkalemia [E87.5] 04/23/2013  . Acute respiratory failure ([place]) [J96.00] 04/23/2013  . Hypercalcemia [E83.52] 02/20/2013  . Hyperparathyroidism, primary ([place]) [E21.0] 02/20/2013  . Tobacco use disorder [F17.200] 11/13/2012  . Overdose of salicylate [S28.768T] 15/72/6203  . HTN (hypertension) [I10] 02/20/2012   History of Present Illness:  Identifying data. 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  Chief complaint. "I am fine."  History of present illness. Jennifer Lawson has a long history of bipolar illness. She was hospitalized several times at [facility] in 2015 for manic, psychotic episodes.  Her last hospitalization was at [facility] in the fall of 2016. Her daughter reports that the patient stopped taking medications few weeks ago and became increasingly disorganized and bizarre. window naked around the house.  She refused dialysis.  The family reports insomnia, irritability, agitation, intrusiveness, and psychosis with paranoia and hallucinations. The patient herself denies any symptoms of depression, anxiety, or psychosis. She denies symptoms of bipolar mania. She denies using alcohol or illicit substances. She is uncooperative on the interview. Her thinking is disorganized and she is not able to answer simple questions. She is oriented to person and place only. She does not believe that I am a physician and has been extremely paranoid accepting medications this morning. Last night she was agitated and required injections to calm her down.   Psychiatric history. Several admissions for manic episodes. There is history of suicide attempt by overdose. there is history of treatment noncompliance.   Family psychiatric history. Unknown.  Social history. She is disabled from mental illness. She lives with her daughter. She has had insurance.  Total Time spent with patient: 1 hour  Past Psychiatric History:  bipolar disorder.  Risk to Self: Is patient at risk for suicide?: No Risk to Others:   Prior Inpatient Therapy:   Prior Outpatient Therapy:    Alcohol Screening: 1. How often do you have a drink containing alcohol?: Never 2. How many drinks containing alcohol do you have on a typical day when you are drinking?: 1 or 2 3. How often do you have six or more drinks on one occasion?: Never Preliminary Score: 0 4. How often during the last year have you found that you were not able to stop drinking once you had started?: Never 5. How often during the last year have you failed to do what was normally expected from you becasue of drinking?: Never 6. How often during the last year have you needed a first drink in the morning to get yourself going after a heavy  drinking session?: Never 7. How often during the last year have you had a feeling of guilt of remorse after drinking?: Never 8. How often during the last year have you been  unable to remember what happened the night before because you had been drinking?: Never 9. Have you or someone else been injured as a result of your drinking?: No 10. Has a relative or friend or a doctor or another health worker been concerned about your drinking or suggested you cut down?: No Alcohol Use Disorder Identification Test Final Score (AUDIT): 0 Substance Abuse History in the last 12 months:  No. Consequences of Substance Abuse: NA Previous Psychotropic Medications: Yes  Psychological Evaluations: No  Past Medical History:  Past Medical History  Diagnosis Date  . Mental disorder   . Depression   . Hypertension   . Overdose   . Tobacco use disorder 11/13/2012  . Complication of anesthesia     difficulty going to sleep  . Chronic kidney disease     06/11/13- not on dialysis  . Shortness of breath     lying down flat  . PTSD (post-traumatic stress disorder)   . Asthma   . COPD (chronic obstructive pulmonary disease) ([place])   . Heart murmur   . GERD (gastroesophageal reflux disease)   . Seizures ([place])     "passsed out"  . History of blood transfusion   . Diabetes mellitus without complication [facility])     denies    Past Surgical History  Procedure Laterality Date  . Right knee replacement      she says it was last year.  . Esophagogastroduodenoscopy Left 11/14/2012    Procedure: ESOPHAGOGASTRODUODENOSCOPY (EGD);  Surgeon: [name], MD;  Location: [facility] ENDOSCOPY;  Service: Endoscopy;  Laterality: Left;  . Joint replacement Right     knee  . Parathyroidectomy    . Av fistula placement Right 06/12/2013    Procedure: ARTERIOVENOUS (AV) FISTULA CREATION; RIGHT  BASILIC VEIN TRANSPOSITION with Intraoperative ultrasound;  Surgeon: Mal [name], MD;  Location: [facility] OR;  Service: Vascular;  Laterality: Right;   Family History:  Family History  Problem Relation Age of Onset  . Diabetes Mother   . Hyperlipidemia Mother   . Hypertension Mother   . Diabetes Father   . Hypertension  Father   . Hyperlipidemia Father    Family Psychiatric  History: unknown. Social History:  History  Alcohol Use No    Comment: none in over a month per daughter     History  Drug Use No    Comment: pt denied any drug use    Social History   Social History  . Marital Status: Divorced    Spouse Name: N/A  . Number of Children: N/A  . Years of Education: N/A   Social History Main Topics  . Smoking status: Current Every Day Smoker -- 2.00 packs/day for 40 years    Types: Cigarettes, Cigars  . Smokeless tobacco: Never Used  . Alcohol Use: No     Comment: none in over a month per daughter  . Drug Use: No     Comment: pt denied any drug use  . Sexual Activity: No   Other Topics Concern  . None   Social History Narrative   Additional Social History:    History of alcohol / drug use?: No history of alcohol / drug abuse                    Allergies:  Allergies  Allergen Reactions  . Codeine Sulfate Anaphylaxis    Daughter called about having this allergy   . Gabapentin Other (See Comments) and Anaphylaxis    seizures  . Haldol [Haloperidol Lactate] Shortness Of Breath  . Risperidone And Related Shortness Of Breath  . Trazodone And Nefazodone Other (See Comments)    Makes pt lose balance and fall  . Depakote [Divalproex Sodium] Other (See Comments)    Nose bleeds-currently taking medication  . Haloperidol Other (See Comments)    hallucinations  . Invega [Paliperidone Er] Nausea And Vomiting  . Risperidone Other (See Comments)    Hallucinations?  Marland Kitchen Vistaril [Hydroxyzine Hcl] Nausea And Vomiting  . Hydrocodone-Acetaminophen Itching   Lab Results:  Results for orders placed or performed during the hospital encounter of 06/01/15 (from the past 48 hour(s))  Renal function panel     Status: Abnormal   Collection Time: 06/02/15 10:45 AM  Result Value Ref Range   Sodium 137 135 - 145 mmol/L   Potassium 4.2 3.5 - 5.1 mmol/L   Chloride 103 101 - 111 mmol/L    CO2 24 22 - 32 mmol/L   Glucose, Bld 78 65 - 99 mg/dL   BUN 71 (H) 6 - 20 mg/dL   Creatinine, Ser 4.93 (H) 0.44 - 1.00 mg/dL   Calcium 9.1 8.9 - 10.3 mg/dL   Phosphorus 6.5 (H) 2.5 - 4.6 mg/dL   Albumin 3.1 (L) 3.5 - 5.0 g/dL   GFR calc non Af Amer 9 (L) >60 mL/min   GFR calc Af Amer 10 (L) >60 mL/min    Comment: (NOTE) The eGFR has been calculated using the CKD EPI equation. This calculation has not been validated in all clinical situations. eGFR's persistently <60 mL/min signify possible Chronic Kidney Disease.    Anion gap 10 5 - 15  CBC     Status: Abnormal   Collection Time: 06/02/15 10:45 AM  Result Value Ref Range   WBC 5.0 3.6 - 11.0 K/uL   RBC 3.84 3.80 - 5.20 MIL/uL   Hemoglobin 12.0 12.0 - 16.0 g/dL   HCT 38.2 35.0 - 47.0 %   MCV 99.5 80.0 - 100.0 fL   MCH 31.3 26.0 - 34.0 pg   MCHC 31.5 (L) 32.0 - 36.0 g/dL   RDW 21.8 (H) 11.5 - 14.5 %   Platelets 209 150 - 196 K/uL    Metabolic Disorder Labs:  Lab Results  Component Value Date   HGBA1C 4.3 01/09/2015   MPG 97 05/14/2014   MPG 94 02/06/2014   No results found for: PROLACTIN Lab Results  Component Value Date   CHOL 167 01/09/2015   TRIG 55 01/09/2015   HDL 57 01/09/2015   CHOLHDL 2.9 01/09/2015   VLDL 11 01/09/2015   LDLCALC 99 01/09/2015   LDLCALC 88 05/14/2014    Current Medications: Current Facility-Administered Medications  Medication Dose Route Frequency Provider Last Rate Last Dose  . acetaminophen (TYLENOL) tablet 650 mg  650 mg Oral Q6H PRN Cheney Gosch B Isebella Upshur, MD      . albuterol (PROVENTIL HFA;VENTOLIN HFA) 108 (90 Base) MCG/ACT inhaler 2 puff  2 puff Inhalation Q4H PRN Julene Rahn B Tasnia Spegal, MD      . alum & mag hydroxide-simeth (MAALOX/MYLANTA) 200-200-20 MG/5ML suspension 30 mL  30 mL Oral Q4H PRN Turner Baillie B Jacquis Paxton, MD      . bisacodyl (DULCOLAX) EC tablet 5 mg  5 mg Oral Daily PRN Clovis Fredrickson, MD      .  cinacalcet (SENSIPAR) tablet 60 mg  60 mg Oral BID WC Glynna Failla B  Emery Dupuy, MD   60 mg at 06/02/15 1013  . divalproex (DEPAKOTE ER) 24 hr tablet 750 mg  750 mg Oral QHS Lamanda Rudder B Cesare Sumlin, MD   750 mg at 06/01/15 2129  . docusate sodium (COLACE) capsule 100 mg  100 mg Oral BID Jordi Lacko B Gavriel Holzhauer, MD   100 mg at 06/02/15 1012  . doxepin (SINEQUAN) capsule 25 mg  25 mg Oral QHS Clovis Fredrickson, MD   25 mg at 06/01/15 2130  . LORazepam (ATIVAN) tablet 2 mg  2 mg Oral Q4H PRN Clovis Fredrickson, MD       Or  . LORazepam (ATIVAN) injection 2 mg  2 mg Intramuscular Q4H PRN Vikram Tillett B Rubie Ficco, MD      . magnesium hydroxide (MILK OF MAGNESIA) suspension 30 mL  30 mL Oral Daily PRN Maddy Graham B Monserrate Blaschke, MD      . metoprolol tartrate (LOPRESSOR) tablet 25 mg  25 mg Oral BID Javi Bollman B Glen Blatchley, MD   25 mg at 06/02/15 1012  . mometasone-formoterol (DULERA) 100-5 MCG/ACT inhaler 2 puff  2 puff Inhalation BID Mayerli Kirst B Kamrynn Melott, MD   2 puff at 06/02/15 1013  . nicotine (NICODERM CQ - dosed in mg/24 hours) patch 21 mg  21 mg Transdermal Q0600 Saiquan Hands B Abdulai Blaylock, MD   21 mg at 06/02/15 1014  . pantoprazole (PROTONIX) EC tablet 40 mg  40 mg Oral Daily Matteus Mcnelly B Swati Granberry, MD   40 mg at 06/02/15 1012  . sevelamer carbonate (RENVELA) tablet 800 mg  800 mg Oral TID WC Yahshua Thibault B Tykiera Raven, MD   800 mg at 06/02/15 1429  . ziprasidone (GEODON) capsule 80 mg  80 mg Oral BID WC Javen Ridings B Leiby Pigeon, MD   80 mg at 06/02/15 1011  . ziprasidone (GEODON) injection 20 mg  20 mg Intramuscular Q2H PRN Clovis Fredrickson, MD   20 mg at 06/02/15 0028   PTA Medications: Prescriptions prior to admission  Medication Sig Dispense Refill Last Dose  . acetaminophen (TYLENOL) 325 MG tablet Take 2 tablets (650 mg total) by mouth every 4 (four) hours as needed for fever, headache or mild pain. (Patient not taking: Reported on 05/30/2015)   Not Taking at Unknown time  . albuterol (PROVENTIL HFA;VENTOLIN HFA) 108 (90 BASE) MCG/ACT inhaler Inhale 2 puffs into the lungs every 6 (six)  hours as needed for wheezing or shortness of breath. 1 Inhaler 0 Past Month at Unknown time  . albuterol (PROVENTIL) (2.5 MG/3ML) 0.083% nebulizer solution Take 2.5 mg by nebulization every 6 (six) hours as needed for wheezing or shortness of breath.   Past Month at Unknown time  . amoxicillin-clavulanate (AUGMENTIN) 500-125 MG per tablet Take 1 tablet (500 mg total) by mouth daily. 5 tablet 0   . Artificial Saliva (BIOTENE ORALBALANCE DRY MOUTH) LIQD Rinse your mouth every 6 hours. 1 Bottle 12   . b complex-vitamin c-folic acid (NEPHRO-VITE) 0.8 MG TABS tablet Take 1 tablet by mouth daily.   Past Week at Unknown time  . bisacodyl (DULCOLAX) 5 MG EC tablet Take 1 tablet (5 mg total) by mouth daily as needed for moderate constipation. (Patient not taking: Reported on 10/13/2014) 30 tablet 0 Not Taking at Unknown time  . butalbital-acetaminophen-caffeine (FIORICET, ESGIC) 50-325-40 MG tablet Take 1 tablet by mouth every 6 (six) hours as needed for headache.   2 unknown  . cinacalcet (SENSIPAR) 60 MG tablet Take 60  mg by mouth daily. Takes with largest meal once daily   Past Week at Unknown time  . Darbepoetin Alfa (ARANESP) 60 MCG/0.3ML SOSY injection Inject 0.3 mLs (60 mcg total) into the vein every Thursday with hemodialysis. 4.2 mL  dialysis med  . divalproex (DEPAKOTE ER) 250 MG 24 hr tablet Take 500 mg by mouth 2 (two) times daily.   Past Week at Unknown time  . docusate sodium 100 MG CAPS Take 100 mg by mouth 2 (two) times daily. (Patient not taking: Reported on 10/13/2014) 10 capsule 0 Not Taking at Unknown time  . doxepin (SINEQUAN) 25 MG capsule Take 25 mg by mouth at bedtime.  1 Past Week at Unknown time  . ferric gluconate 250 mg in sodium chloride 0.9 % 100 mL Inject 250 mg into the vein every Tuesday, Thursday, Saturday, and Sunday.   dialysis med  . LORazepam (ATIVAN) 0.5 MG tablet Take 0.5 mg by mouth every 8 (eight) hours as needed for anxiety or sleep.    Past Week at Unknown time  .  metoprolol tartrate (LOPRESSOR) 25 MG tablet Take 0.5 tablets (12.5 mg total) by mouth 2 (two) times daily. (Patient taking differently: Take 25 mg by mouth 2 (two) times daily. ) 15 tablet 0 Past Week at Unknown time  . mometasone-formoterol (DULERA) 100-5 MCG/ACT AERO Inhale 2 puffs into the lungs 2 (two) times daily. 1 Inhaler 0   . Nutritional Supplements (FEEDING SUPPLEMENT, NEPRO CARB STEADY,) LIQD Take 237 mLs by mouth as needed (missed meal during dialysis.). (Patient not taking: Reported on 10/13/2014)  0 Not Taking at Unknown time  . pantoprazole (PROTONIX) 40 MG tablet Take 1 tablet (40 mg total) by mouth daily. (Patient not taking: Reported on 10/13/2014) 30 tablet 0 Not Taking at Unknown time  . sevelamer carbonate (RENVELA) 800 MG tablet Take 1,600 mg by mouth 3 (three) times daily with meals. Takes 2 tabs with each meal and snacks as directed   Past Week at Unknown time  . simvastatin (ZOCOR) 10 MG tablet Take 10 mg by mouth at bedtime. Reported on 05/30/2015  5 Not Taking at Unknown time  . ziprasidone (GEODON) 60 MG capsule Take 1 capsule (60 mg total) by mouth 2 (two) times daily with a meal. For mood control 60 capsule 0 Past Week at Unknown time    Musculoskeletal: Strength & Muscle Tone: within normal limits Gait & Station: normal Patient leans: N/A  Psychiatric Specialty Exam: I reviewed physical exam performed in the emergency room and agree with the findings. Physical Exam  Nursing note and vitals reviewed.   Review of Systems  All other systems reviewed and are negative.   Blood pressure 102/67, pulse 70, temperature 98.1 F (36.7 C), temperature source Oral, resp. rate 16, height _0  (1.575 m), weight 82.555 kg (182 lb), last menstrual period 05/09/2008, SpO2 98 %.Body mass index is 33.28 kg/(m^2).  See STA.                                                  Sleep:  Number of Hours: 4.5     Treatment Plan Summary: Daily contact with patient to  assess and evaluate symptoms and progress in treatment and Medication management   Jennifer Lawson is a 55 year old female with a history of bipolar illness and end-stage kidney disease on dialysis admitted  for a manic episode in the context of treatment noncompliance.  1. Bipolar mania. We restarted Depakote and Geodon for psychosis and mood stabilization.  2. Kidney disease. Nephrology consult is greatly appreciated. The patient will continue on dialysis.  3. COPD. We continue inhalers.  4. Hypertension. She is on metoprolol.  5. GERD. She is on Protonix.  6. Smoking. Nicotine patch is available.  7. Insomnia. We'll offer trazodone.  8. Disposition. She will be discharged to home with her daughter. She will follow up with her regular psychiatrist and nephrologist.  Observation Level/Precautions:  15 minute checks  Laboratory:  CBC Chemistry Profile UDS UA  Psychotherapy:    Medications:    Consultations:    Discharge Concerns:    Estimated LOS:  Other:     I certify that inpatient services furnished can reasonably be expected to improve the patient's condition.   Verba Ainley 1/24/20174:23 PM

## 2015-06-03 LAB — HEPATITIS B SURFACE ANTIGEN: Hepatitis B Surface Ag: NEGATIVE

## 2015-06-03 LAB — HEPATITIS B CORE ANTIBODY, TOTAL: Hep B Core Total Ab: NEGATIVE

## 2015-06-03 LAB — LIPID PANEL
Cholesterol: 184 mg/dL (ref 0–200)
HDL: 65 mg/dL (ref >40)
LDL Cholesterol: 99 mg/dL (ref 0–99)
Total CHOL/HDL Ratio: 2.8 RATIO
Triglycerides: 102 mg/dL (ref <150)
VLDL: 20 mg/dL (ref 0–40)

## 2015-06-03 LAB — TSH: TSH: 1.1 u[IU]/mL (ref 0.350–4.500)

## 2015-06-03 LAB — HEMOGLOBIN A1C: Hgb A1c MFr Bld: 4.3 % (ref 4.0–6.0)

## 2015-06-03 LAB — HEPATITIS B SURFACE ANTIBODY,QUALITATIVE: Hep B S Ab: REACTIVE

## 2015-06-03 NOTE — Progress Notes (Signed)
D:  Patient is alert on the unit this shift.  Patient is irritable on the unit this am.  Patient is questioning her medical care here.  Patient is questioning her medications.  Patient is expressing that she feel she is in jail here.  Patient expresses that staff are lying to her here.  Patient is questioning her food and snacks and saying that it is not the right food and she is hungry.  Patient paces the unit, and repeatedly knocks on the windows of the nursing station to express her displeasure and mistrust of the staff here and the care she is receiving.  Patient is redirectable with extensive prompting.  Patient expresses that her legs hurt but is not accepting of staff offers for pain medication. A:  Scheduled medications are administered to patient as per MD orders.  Emotional support and encouragement are provided.  Patient is maintained on q.15 minute safety checks.  Patient is informed to notify staff with questions or concerns. R:  No adverse medication reactions are noted.  Patient is cooperative with medication administration with extensive prompting today.

## 2015-06-03 NOTE — Tx Team (Signed)
Interdisciplinary Treatment Plan Update (Adult)  Date:  06/03/2015 Time Reviewed:  10:32 AM  Progress in Treatment: Attending groups: No. Participating in groups:  No. Taking medication as prescribed:  Yes. Tolerating medication:  Yes. Family/Significant othe contact made:  No, will contact:  if patient provides consent Patient understands diagnosis:  No. Discussing patient identified problems/goals with staff:  Yes. Medical problems stabilized or resolved:  Yes. Denies suicidal/homicidal ideation: Yes. Issues/concerns per patient self-inventory:  Yes. Other:  New problem(s) identified: No, Describe:  none reported  Discharge Plan or Barriers: Patient will stabilize on medications and discharge home. Patient will need hospital follow up schedule for outpatient services at discharge.  Reason for Continuation of Hospitalization: Anxiety Medication stabilization Other; describe paranoia and bizarre behavior  Comments:  Estimated length of stay: up to 7 days expected discharge Tuesday 06/09/15  New goal(s):  Review of initial/current patient goals per problem list:   1.  Goal(s):participate in aftercare plan  Met:  no  Target date:by discharge  As evidenced EX:HBZJIRC will participate in aftercare plan AEB aftercare provider and housing plan identified at discharge  2.  Goal (s):decrease psychosis  Met:  no  Target date:by discharge  As evidenced VE:[id number] decreased signs of pschosis   Attendees: Physician:  Orson Slick, MD 1/25/201710:20 AM  Nursing:   Nicanor Bake, RN 1/25/201710:20 AM  Other:  Carmell Austria, LCSWA 1/25/201710:20 AM  Other:  Dossie Arbour, LCSW 1/25/201710:20 AM  Other:  Marylou Flesher, LCSWA 1/25/201710:20 AM  Other: Everitt Amber, LRT 1/25/201710:20 AM  Other: Levada Schilling, PA Student 1/25/201710:20 AM  Other:  1/25/201710:20 AM  Other:  1/25/201710:20 AM  Other:  1/25/201710:20 AM  Other:  1/25/201710:20 AM  Other:    1/25/201710:20 AM    Scribe for Treatment Team:   Keene Breath, MSW, LCSWA 06/03/2015, 10:32 AM

## 2015-06-03 NOTE — Plan of Care (Signed)
Problem: Alteration in mood; excessive anxiety as evidenced by: Goal: STG-Pt will report an absence of self-harm thoughts/actions (Patient will report an absence of self-harm thoughts or actions)  Outcome: Progressing Patient reports that she does not have suicidal ideation currently

## 2015-06-03 NOTE — BHH Group Notes (Signed)
[facility] Group Notes:  (Nursing/MHT/Case Management/Adjunct)  Date:  06/03/2015  Time:  12:39 PM  Type of Therapy:  Group Therapy  Participation Level:  Active  Participation Quality:  Appropriate  Affect:  Appropriate  Cognitive:  Appropriate  Insight:  Good  Engagement in Group:  Engaged  Modes of Intervention:  Activity  Summary of Progress/Problems:  Jennifer Lawson 06/03/2015, 12:39 PM

## 2015-06-03 NOTE — BHH Group Notes (Signed)
[facility] LCSW Group Therapy  06/03/2015 4:36 PM  Type of Therapy:  Group Therapy  Participation Level:  Minimal  Participation Quality:  Drowsy  Affect:  Irritable  Cognitive:  Appropriate  Insight:  Limited  Engagement in Therapy:  Limited  Modes of Intervention:  Discussion, Education, Exploration, Socialization and Support  Summary of Progress/Problems: Self esteem: Patients discussed self esteem and how it impacts them. They discussed what aspects in their lives has influenced their self esteem. They were challenged to identify changes that are needed in order to improve self esteem.  Jennifer Lawson attended group and stayed most of the time. During the beginning of group she was very upset because she was cold. She states they took too much blood from her today so she was cold. She slept through most of the group.   Terry MSW, [place]  06/03/2015, 4:36 PM

## 2015-06-03 NOTE — Plan of Care (Signed)
Problem: Alteration in mood; excessive anxiety as evidenced by: Goal: STG-Pt will report an absence of self-harm thoughts/actions (Patient will report an absence of self-harm thoughts or actions)  Outcome: Progressing Patient denies self harm thoughts at this time.

## 2015-06-03 NOTE — Progress Notes (Signed)
Patient is irritable and agitated, pacing the unit and knocking on the nurses' station glass repeatedly.  Patient reports we are keeping her here against her will and stating that "my family would never put me here."  Patient's irritation is escalating as she occasionally raises her voice at staff.  Patient is reporting that staff is unaware of her allergies or her regular medications.  Patient reports feeling held prisoner here.  Patient reports that she does not believe staff is trying to take care of her here.  Patient is offered medication to manage her anxiety and expresses willingness to take this medication.  Patient is cooperative with the administration of po Ativan.

## 2015-06-03 NOTE — Progress Notes (Signed)
D:Patient has remained disorganized and intrusive. She constantly is repeating herself asking why she is here and when she is getting dialysis. She is also very anxious wondering why she's in [place] and not [place]. She denies SI/HI/AVH. She stated pain at a 10.  A:Medication was given with education. Encouragement was provided. Sandwich was provided because she was not able to eat the meal that came with supper. Medication for anxiety and pain was given.  R:Patient was complaint with medication after much education. She has remained cooperative after redirection. Safety maintained with 15 min checks.

## 2015-06-03 NOTE — BHH Group Notes (Signed)
[facility] LCSW Aftercare Discharge Planning Group Note   06/03/2015 10:56 AM  Participation Quality:  Did not attend   [place], [place] MSW, [place]

## 2015-06-03 NOTE — Progress Notes (Signed)
[facility] MD Progress Note  06/03/2015 4:13 PM Jennifer Lawson  MRN:  [id number]  Subjective:  Jennifer Lawson is loud, agitated, and and cooperated today. She is impossible to redirect. She is very suspicious and accuses RO giving her home medications. She again does not consider me a psychiatrist as she did in the past. She is surprised to be at [facility]. She believes that she belongs to [facility]: Emergency room. She has multiple somatic complaints. Her head, neck, back, knee and foot hurt. She complains (on her left arm from dialysis she received in the past.  Principal Problem: Bipolar I disorder, most recent episode manic, severe with psychotic features ([place]) Diagnosis:   Patient Active Problem List   Diagnosis Date Noted  . Bipolar I disorder, most recent episode manic, severe with psychotic features ([place]) [F31.2] 06/01/2015  . Change in mental status [R41.82] 01/26/2015  . Anemia of renal disease [D63.1] 01/26/2015  . Pain in the chest [R07.9]   . Pleuritic chest pain [R07.81] 07/31/2014  . Symptomatic anemia [D64.9] 07/29/2014  . Absolute anemia [D64.9]   . CKD (chronic kidney disease) stage V requiring chronic dialysis ([place]) [N18.6, Z99.2] 05/13/2014  . Salicylate overdose [I77.824M] 05/13/2014  . ESRD (end stage renal disease) ([place]) [N18.6] 01/16/2014  . COPD (chronic obstructive pulmonary disease) ([place]) [J44.9] 09/26/2013  . COPD exacerbation ([place]) [J44.1] 04/24/2013  . Hyperkalemia [E87.5] 04/23/2013  . Acute respiratory failure ([place]) [J96.00] 04/23/2013  . Hypercalcemia [E83.52] 02/20/2013  . Hyperparathyroidism, primary ([place]) [E21.0] 02/20/2013  . Tobacco use disorder [F17.200] 11/13/2012  . Overdose of salicylate [P53.614E] 31/54/0086  . HTN (hypertension) [I10] 02/20/2012   Total Time spent with patient: 20 minutes  Past Psychiatric History: Bipolar disorder.  Past Medical History:  Past Medical History  Diagnosis Date  . Mental disorder   .  Depression   . Hypertension   . Overdose   . Tobacco use disorder 11/13/2012  . Complication of anesthesia     difficulty going to sleep  . Chronic kidney disease     06/11/13- not on dialysis  . Shortness of breath     lying down flat  . PTSD (post-traumatic stress disorder)   . Asthma   . COPD (chronic obstructive pulmonary disease) ([place])   . Heart murmur   . GERD (gastroesophageal reflux disease)   . Seizures ([place])     "passsed out"  . History of blood transfusion   . Diabetes mellitus without complication [facility])     denies    Past Surgical History  Procedure Laterality Date  . Right knee replacement      she says it was last year.  . Esophagogastroduodenoscopy Left 11/14/2012    Procedure: ESOPHAGOGASTRODUODENOSCOPY (EGD);  Surgeon: [name], MD;  Location: [facility] ENDOSCOPY;  Service: Endoscopy;  Laterality: Left;  . Joint replacement Right     knee  . Parathyroidectomy    . Av fistula placement Right 06/12/2013    Procedure: ARTERIOVENOUS (AV) FISTULA CREATION; RIGHT  BASILIC VEIN TRANSPOSITION with Intraoperative ultrasound;  Surgeon: Mal [name], MD;  Location: [facility] OR;  Service: Vascular;  Laterality: Right;   Family History:  Family History  Problem Relation Age of Onset  . Diabetes Mother   . Hyperlipidemia Mother   . Hypertension Mother   . Diabetes Father   . Hypertension Father   . Hyperlipidemia Father    Family Psychiatric  History: None reported. Social History:  History  Alcohol Use No    Comment: none  in over a month per daughter     History  Drug Use No    Comment: pt denied any drug use    Social History   Social History  . Marital Status: Divorced    Spouse Name: N/A  . Number of Children: N/A  . Years of Education: N/A   Social History Main Topics  . Smoking status: Current Every Day Smoker -- 2.00 packs/day for 40 years    Types: Cigarettes, Cigars  . Smokeless tobacco: Never Used  . Alcohol Use: No     Comment: none in over a month per  daughter  . Drug Use: No     Comment: pt denied any drug use  . Sexual Activity: No   Other Topics Concern  . None   Social History Narrative   Additional Social History:    History of alcohol / drug use?: No history of alcohol / drug abuse                    Sleep: Fair  Appetite:  Fair  Current Medications: Current Facility-Administered Medications  Medication Dose Route Frequency Provider Last Rate Last Dose  . acetaminophen (TYLENOL) tablet 650 mg  650 mg Oral Q6H PRN Clovis Fredrickson, MD   650 mg at 06/02/15 2012  . albuterol (PROVENTIL HFA;VENTOLIN HFA) 108 (90 Base) MCG/ACT inhaler 2 puff  2 puff Inhalation Q4H PRN Inocencia Murtaugh B Bear Osten, MD      . alum & mag hydroxide-simeth (MAALOX/MYLANTA) 200-200-20 MG/5ML suspension 30 mL  30 mL Oral Q4H PRN Shizuye Rupert B Bette Brienza, MD      . bisacodyl (DULCOLAX) EC tablet 5 mg  5 mg Oral Daily PRN Etienne Mowers B Marthella Osorno, MD      . cinacalcet (SENSIPAR) tablet 60 mg  60 mg Oral BID WC Janvi Ammar B Hezikiah Retzloff, MD   60 mg at 06/03/15 0832  . divalproex (DEPAKOTE ER) 24 hr tablet 750 mg  750 mg Oral QHS Clovis Fredrickson, MD   750 mg at 06/02/15 2159  . docusate sodium (COLACE) capsule 100 mg  100 mg Oral BID Clovis Fredrickson, MD   100 mg at 06/03/15 0832  . doxepin (SINEQUAN) capsule 25 mg  25 mg Oral QHS Maryela Tapper B Laketa Sandoz, MD   25 mg at 06/02/15 2200  . LORazepam (ATIVAN) tablet 2 mg  2 mg Oral Q4H PRN Clovis Fredrickson, MD   2 mg at 06/03/15 0953   Or  . LORazepam (ATIVAN) injection 2 mg  2 mg Intramuscular Q4H PRN Jonalyn Sedlak B Naria Abbey, MD      . magnesium hydroxide (MILK OF MAGNESIA) suspension 30 mL  30 mL Oral Daily PRN Gunnison Chahal B Lakesia Dahle, MD      . metoprolol tartrate (LOPRESSOR) tablet 25 mg  25 mg Oral BID Clovis Fredrickson, MD   25 mg at 06/03/15 0832  . mometasone-formoterol (DULERA) 100-5 MCG/ACT inhaler 2 puff  2 puff Inhalation BID Clovis Fredrickson, MD   2 puff at 06/03/15 0831  . nicotine (NICODERM  CQ - dosed in mg/24 hours) patch 21 mg  21 mg Transdermal Q0600 Yarieliz Wasser B Chay Mazzoni, MD   21 mg at 06/02/15 1014  . pantoprazole (PROTONIX) EC tablet 40 mg  40 mg Oral Daily Clovis Fredrickson, MD   40 mg at 06/03/15 0832  . sevelamer carbonate (RENVELA) tablet 800 mg  800 mg Oral TID WC Ronica Vivian B Trayden Brandy, MD   800 mg at 06/03/15 1155  . ziprasidone (  GEODON) capsule 80 mg  80 mg Oral BID WC Balian Schaller B Shaunak Kreis, MD   80 mg at 06/03/15 0833  . ziprasidone (GEODON) injection 20 mg  20 mg Intramuscular Q2H PRN Clovis Fredrickson, MD   20 mg at 06/02/15 0028    Lab Results:  Results for orders placed or performed during the hospital encounter of 06/01/15 (from the past 48 hour(s))  Renal function panel     Status: Abnormal   Collection Time: 06/02/15 10:45 AM  Result Value Ref Range   Sodium 137 135 - 145 mmol/L   Potassium 4.2 3.5 - 5.1 mmol/L   Chloride 103 101 - 111 mmol/L   CO2 24 22 - 32 mmol/L   Glucose, Bld 78 65 - 99 mg/dL   BUN 71 (H) 6 - 20 mg/dL   Creatinine, Ser 4.93 (H) 0.44 - 1.00 mg/dL   Calcium 9.1 8.9 - 10.3 mg/dL   Phosphorus 6.5 (H) 2.5 - 4.6 mg/dL   Albumin 3.1 (L) 3.5 - 5.0 g/dL   GFR calc non Af Amer 9 (L) >60 mL/min   GFR calc Af Amer 10 (L) >60 mL/min    Comment: (NOTE) The eGFR has been calculated using the CKD EPI equation. This calculation has not been validated in all clinical situations. eGFR's persistently <60 mL/min signify possible Chronic Kidney Disease.    Anion gap 10 5 - 15  CBC     Status: Abnormal   Collection Time: 06/02/15 10:45 AM  Result Value Ref Range   WBC 5.0 3.6 - 11.0 K/uL   RBC 3.84 3.80 - 5.20 MIL/uL   Hemoglobin 12.0 12.0 - 16.0 g/dL   HCT 38.2 35.0 - 47.0 %   MCV 99.5 80.0 - 100.0 fL   MCH 31.3 26.0 - 34.0 pg   MCHC 31.5 (L) 32.0 - 36.0 g/dL   RDW 21.8 (H) 11.5 - 14.5 %   Platelets 209 150 - 440 K/uL  Hepatitis B surface antigen     Status: None   Collection Time: 06/02/15 10:45 AM  Result Value Ref Range    Hepatitis B Surface Ag Negative Negative    Comment: (NOTE) Performed At: [facility] [place], [place] [id number] Lindon Romp MD [id number]   Hepatitis B surface antibody     Status: None   Collection Time: 06/02/15 10:45 AM  Result Value Ref Range   Hep B S Ab Reactive     Comment: (NOTE)              Non Reactive: Inconsistent with immunity,                            less than 10 mIU/mL              Reactive:     Consistent with immunity,                            greater than 9.9 mIU/mL Performed At: [facility] [place], [place] [id number] Lindon Romp MD [id number]   Hepatitis B core antibody, total     Status: None   Collection Time: 06/02/15 10:45 AM  Result Value Ref Range   Hep B Core Total Ab Negative Negative    Comment: (NOTE) Performed At: [facility] [address] [place], [place] [id number] Lindon Romp MD [id number]   Lipid panel  Status: None   Collection Time: 06/03/15  7:45 AM  Result Value Ref Range   Cholesterol 184 0 - 200 mg/dL   Triglycerides 102 <150 mg/dL   HDL 65 >40 mg/dL   Total CHOL/HDL Ratio 2.8 RATIO   VLDL 20 0 - 40 mg/dL   LDL Cholesterol 99 0 - 99 mg/dL    Comment:        Total Cholesterol/HDL:CHD Risk Coronary Heart Disease Risk Table                     Men   Women  1/2 Average Risk   3.4   3.3  Average Risk       5.0   4.4  2 X Average Risk   9.6   7.1  3 X Average Risk  23.4   11.0        Use the calculated Patient Ratio above and the CHD Risk Table to determine the patient's CHD Risk.        ATP III CLASSIFICATION (LDL):  <100     mg/dL   Optimal  100-129  mg/dL   Near or Above                    Optimal  130-159  mg/dL   Borderline  160-189  mg/dL   High  >190     mg/dL   Very High   Hemoglobin A1c     Status: None   Collection Time: 06/03/15  7:45 AM  Result Value Ref Range   Hgb A1c MFr Bld 4.3 4.0 - 6.0 %  TSH     Status: None    Collection Time: 06/03/15  7:45 AM  Result Value Ref Range   TSH 1.100 0.350 - 4.500 uIU/mL    Physical Findings: AIMS:  , ,  ,  ,    CIWA:    COWS:     Musculoskeletal: Strength & Muscle Tone: within normal limits Gait & Station: normal Patient leans: N/A  Psychiatric Specialty Exam: Review of Systems  Musculoskeletal: Positive for back pain and joint pain.  All other systems reviewed and are negative.   Blood pressure 122/77, pulse 89, temperature 98.9 F (37.2 C), temperature source Oral, resp. rate 16, height 5' 2"  (1.575 m), weight 82.555 kg (182 lb), last menstrual period 05/09/2008, SpO2 98 %.Body mass index is 33.28 kg/(m^2).  General Appearance: Fairly Groomed  Engineer, water::  Fair  Speech:  Pressured  Volume:  Increased  Mood:  Angry, Dysphoric and Irritable  Affect:  Inappropriate  Thought Process:  Disorganized  Orientation:  Full (Time, Place, and Person)  Thought Content:  Delusions and Paranoid Ideation  Suicidal Thoughts:  No  Homicidal Thoughts:  No  Memory:  Immediate;   Fair Recent;   Fair Remote;   Fair  Judgement:  Poor  Insight:  Lacking  Psychomotor Activity:  Increased  Concentration:  Fair  Recall:  [employer] of Knowledge:Fair  Language: Fair  Akathisia:  No  Handed:  Right  AIMS (if indicated):     Assets:  Communication Skills Desire for Improvement Financial Resources/Insurance Housing Resilience Social Support  ADL's:  Intact  Cognition: WNL  Sleep:  Number of Hours: 7.45   Treatment Plan Summary: Daily contact with patient to assess and evaluate symptoms and progress in treatment and Medication management   Jennifer Lawson is a 55 year old female with a history of bipolar illness and end-stage kidney disease on dialysis  admitted for a manic episode in the context of treatment noncompliance.  1. Bipolar mania. We restarted Depakote and Geodon for psychosis and mood stabilization.  2. Kidney disease. Nephrology consult is  greatly appreciated. The patient will continue on dialysis.  3. COPD. We continue inhalers.  4. Hypertension. She is on metoprolol.  5. GERD. She is on Protonix.  6. Smoking. Nicotine patch is available.  7. Insomnia. We'll offer trazodone.  8. Disposition. She will be discharged to home with her daughter. She will follow up with her regular psychiatrist and nephrologist.  Orson Slick 06/03/2015, 4:13 PM

## 2015-06-03 NOTE — BHH Counselor (Signed)
CSW attempted to meet with patient this morning for assessment but patient was irritable and manic not able to participate in assessment. CSW attempted again this afternoon and patient was sleeping and did not wake up after this writer called patient's name several times.

## 2015-06-04 LAB — RENAL FUNCTION PANEL
Albumin: 2.9 g/dL — ABNORMAL LOW (ref 3.5–5.0)
Anion gap: 9 (ref 5–15)
BUN: 67 mg/dL — ABNORMAL HIGH (ref 6–20)
CO2: 25 mmol/L (ref 22–32)
Calcium: 8.6 mg/dL — ABNORMAL LOW (ref 8.9–10.3)
Chloride: 102 mmol/L (ref 101–111)
Creatinine, Ser: 4.76 mg/dL — ABNORMAL HIGH (ref 0.44–1.00)
GFR calc Af Amer: 11 mL/min — ABNORMAL LOW (ref >60)
GFR calc non Af Amer: 9 mL/min — ABNORMAL LOW (ref >60)
Glucose, Bld: 120 mg/dL — ABNORMAL HIGH (ref 65–99)
Phosphorus: 4.8 mg/dL — ABNORMAL HIGH (ref 2.5–4.6)
Potassium: 4.3 mmol/L (ref 3.5–5.1)
Sodium: 136 mmol/L (ref 135–145)

## 2015-06-04 LAB — CBC
HCT: 36.2 % (ref 35.0–47.0)
Hemoglobin: 11.3 g/dL — ABNORMAL LOW (ref 12.0–16.0)
MCH: 31.2 pg (ref 26.0–34.0)
MCHC: 31.3 g/dL — ABNORMAL LOW (ref 32.0–36.0)
MCV: 100 fL (ref 80.0–100.0)
Platelets: 184 10*3/uL (ref 150–440)
RBC: 3.63 MIL/uL — ABNORMAL LOW (ref 3.80–5.20)
RDW: 21.5 % — ABNORMAL HIGH (ref 11.5–14.5)
WBC: 5.1 10*3/uL (ref 3.6–11.0)

## 2015-06-04 LAB — PROLACTIN: Prolactin: 126.4 ng/mL — ABNORMAL HIGH (ref 4.8–23.3)

## 2015-06-04 LAB — GLUCOSE, CAPILLARY: Glucose-Capillary: 74 mg/dL (ref 65–99)

## 2015-06-04 NOTE — Progress Notes (Signed)
Recreation Therapy Notes  Date: 01.26.17 Time: 3:00 pm Location: Craft Room  Group Topic: Leisure Education  Goal Area(s) Addresses:  Patient will identify activities for each letter of the alphabet. Patient will verbalize ability to integrate positive leisure into life post d/c. Patient will verbalize ability to use leisure as a Technical sales engineer.  Behavioral Response: Did not attend  Intervention: Leisure Alphabet  Activity: Patients were given a Leisure Air traffic controller and instructed to list healthy leisure activities for each letter of the alphabet bet.  Education: LRT educated patients on what they need to participate in leisure.  Education Outcome: Patient did not attend group.   Clinical Observations/Feedback: Patient did not attend group.  Leonette Monarch, LRT/CTRS 06/04/2015 4:19 PM

## 2015-06-04 NOTE — BHH Group Notes (Signed)
[place] LCSW Group Therapy  06/04/2015 3:15 PM  Type of Therapy:  Group Therapy  Participation Level:  Did Not Attend  Modes of Intervention:  Discussion, Education, Socialization and Support  Summary of Progress/Problems: Balance in life: Patients will discuss the concept of balance and how it looks and feels to be unbalanced. Pt will identify areas in their life that is unbalanced and ways to become more balanced.    [place] MSW, [place]  06/04/2015, 3:15 PM

## 2015-06-04 NOTE — Progress Notes (Signed)
D: Patient able to go to dialysis and return without any concerns . Patient constantly coming to nursing stations repeating herself from the last conversations she had with staff. Voice she had been told she was going home today . Continue to voice of wanting more food . Received clothing from her daughter. Patient slept when she was unable to  Attend unit programing . A: Encourage patient participation with unit programming . Instruction  Given on  Medication , verbalize understanding. R: Voice no other concerns. Staff continue to monitor

## 2015-06-04 NOTE — Progress Notes (Signed)
Needy, ambulating around the unit without assistive device, rolling walker provided but patient refused to use assistive device; HD - T/TH/Sat. Patient is anxious, requested for multiple telephone call to daughter, "I don't belong here, call my daughter .Marland KitchenMarland KitchenMarland Kitchen"
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
## 2015-06-04 NOTE — BHH Group Notes (Signed)
[place] Group Notes:  (Nursing/MHT/Case Management/Adjunct)  Date:  06/04/2015  Time:  2:06 PM  Type of Therapy:  Psychoeducational Skills  Participation Level:  Did Not Attend  Jennifer Lawson 06/04/2015, 2:06 PM

## 2015-06-04 NOTE — BHH Suicide Risk Assessment (Signed)
[place] INPATIENT:  Family/Significant Other Suicide Prevention Education  Suicide Prevention Education:  Education Completed; Jennifer Lawson (daughter) [phone] has been identified by the patient as the family member/significant other with whom the patient will be residing, and identified as the person(s) who will aid the patient in the event of a mental health crisis (suicidal ideations/suicide attempt).  With written consent from the patient, the family member/significant other has been provided the following suicide prevention education, prior to the and/or following the discharge of the patient.  The suicide prevention education provided includes the following:  Suicide risk factors  Suicide prevention and interventions  National Suicide Hotline telephone number  [facility] assessment telephone number  [facility] Emergency Assistance [place] and/or Residential Mobile Crisis Unit telephone number  Request made of family/significant other to:  Remove weapons (e.g., guns, rifles, knives), all items previously/currently identified as safety concern.    Remove drugs/medications (over-the-counter, prescriptions, illicit drugs), all items previously/currently identified as a safety concern.  The family member/significant other verbalizes understanding of the suicide prevention education information provided.  The family member/significant other agrees to remove the items of safety concern listed above.  Keene Breath, MSW, [facility]anya Presser [phone] 06/04/2015, 3:46 PM

## 2015-06-04 NOTE — BHH Group Notes (Signed)
[place] Group Notes:  (Nursing/MHT/Case Management/Adjunct)  Date:  06/04/2015  Time:  8:52 AM  Type of Therapy:  Community Group   Participation Level:  Did Not Attend  Jennifer Lawson 06/04/2015, 8:52 AM

## 2015-06-04 NOTE — Progress Notes (Signed)
[facility]  ROUNDING NOTE   Subjective:   Seen and examined on hemodialysis. Tolerating treatment well.  Engineer, site at [employer].  UF goal of 2.5 litres   Objective:  Vital signs in last 24 hours:  Temp:  [98.1 F (36.7 C)-98.6 F (37 C)] 98.1 F (36.7 C) (01/26 0849) Pulse Rate:  [84-88] 88 (01/26 0849) Resp:  [16-18] 18 (01/26 1100) BP: (102-133)/(57-86) 102/64 mmHg (01/26 1100) SpO2:  [100 %] 100 % (01/26 0849)  Weight change:  Filed Weights   06/01/15 1828 06/02/15 1040  Weight: 82.555 kg (182 lb) 82.555 kg (182 lb)    Intake/Output: I/O last 3 completed shifts: In: 1080 [P.O.:1080] Out: -    Intake/Output this shift:  Total I/O In: 360 [P.O.:360] Out: -   Physical Exam: General: NAD  Head: Normocephalic, atraumatic. Moist oral mucosal membranes  Eyes: Anicteric, PERRL  Neck: Supple, trachea midline  Lungs:  Clear to auscultation  Heart: Regular rate and rhythm  Abdomen:  Soft, nontender,   Extremities: no peripheral edema.  Neurologic: Nonfocal, moving all four extremities  Skin: No lesions  Access: Right AVF    Basic Metabolic Panel:  Recent Labs Lab 05/30/15 1506 05/31/15 2055 06/02/15 1045 06/04/15 0850  NA 138 138 137 136  K 3.6 3.9 4.2 4.3  CL 98* 100* 103 102  CO2 24 24 24 25   GLUCOSE 90 88 78 120*  BUN 35* 54* 71* 67*  CREATININE 4.37* 5.05* 4.93* 4.76*  CALCIUM 10.3 8.2* 9.1 8.6*  PHOS  --   --  6.5* 4.8*    Liver Function Tests:  Recent Labs Lab 05/30/15 1506 05/31/15 2055 06/02/15 1045 06/04/15 0850  AST 21 16  --   --   ALT 16 11*  --   --   ALKPHOS 152* 116  --   --   BILITOT 0.6 0.6  --   --   PROT 7.7 6.3*  --   --   ALBUMIN 3.5 2.8* 3.1* 2.9*    Recent Labs Lab 05/31/15 2055  LIPASE 43   No results for input(s): AMMONIA in the last 168 hours.  CBC:  Recent Labs Lab 05/30/15 1506 05/31/15 2055 06/02/15 1045 06/04/15 0850  WBC 5.0 4.2 5.0 5.1  NEUTROABS  --  1.7  --    --   HGB 13.0 11.6* 12.0 11.3*  HCT 42.4 37.6 38.2 36.2  MCV 103.7* 102.7* 99.5 100.0  PLT 256 209 209 184    Cardiac Enzymes: No results for input(s): CKTOTAL, CKMB, CKMBINDEX, TROPONINI in the last 168 hours.  BNP: Invalid input(s): POCBNP  CBG:  Recent Labs Lab 06/04/15 [id number]    Microbiology: Results for orders placed or performed during the hospital encounter of 01/26/15  MRSA PCR Screening     Status: None   Collection Time: 01/26/15 11:28 PM  Result Value Ref Range Status   MRSA by PCR NEGATIVE NEGATIVE Final    Comment:        The GeneXpert MRSA Assay (FDA approved for NASAL specimens only), is one component of a comprehensive MRSA colonization surveillance program. It is not intended to diagnose MRSA infection nor to guide or monitor treatment for MRSA infections.     Coagulation Studies: No results for input(s): LABPROT, INR in the last 72 hours.  Urinalysis: No results for input(s): COLORURINE, LABSPEC, PHURINE, GLUCOSEU, HGBUR, BILIRUBINUR, KETONESUR, PROTEINUR, UROBILINOGEN, NITRITE, LEUKOCYTESUR in the last 72 hours.  Invalid input(s): APPERANCEUR  Imaging: No results found.   Medications:     . cinacalcet  60 mg Oral BID WC  . divalproex  750 mg Oral QHS  . docusate sodium  100 mg Oral BID  . doxepin  25 mg Oral QHS  . metoprolol tartrate  25 mg Oral BID  . mometasone-formoterol  2 puff Inhalation BID  . nicotine  21 mg Transdermal Q0600  . pantoprazole  40 mg Oral Daily  . sevelamer carbonate  800 mg Oral TID WC  . ziprasidone  80 mg Oral BID WC   acetaminophen, albuterol, alum & mag hydroxide-simeth, bisacodyl, LORazepam **OR** LORazepam, magnesium hydroxide, ziprasidone  Assessment/ Plan:  [name] is a 55 y.o. black female with ESRD on HD since 1/16 followed by [facility], COPD, secondary hyperparathyroidism, anemia of CKD, bipolar affective disorder  1. ESRD on HD TTS: seen and examined on  hemodialysis. Tolerating treatment well.  - Continue TTS schedule.   2. Anemia of CKD: hemoglobin 11.3 - holding epo.   3. Secondary hyperparathyroidism of renal origin: phosphorus at goal 4.8 - sevelamer for binding.   4. Hypertension: On metoprolol. Well controlled.   LOS: 3 Jennifer Lawson 1/26/201711:41 AM

## 2015-06-04 NOTE — Tx Team (Signed)
Interdisciplinary Treatment Plan Update (Adult)  Date:  06/04/2015 Time Reviewed:  4:44 PM  Progress in Treatment: Attending groups: No. Patient received dialysis and was not able to attend group Participating in groups:  No. Taking medication as prescribed:  Yes. Tolerating medication:  Yes. Family/Significant othe contact made:  Yes, individual(s) contacted:  patient's daughter Patient understands diagnosis:  As evidenced by:  patient sharing follow up information for her psychiatrist to arrange aftercare   Discussing patient identified problems/goals with staff:  Yes. Medical problems stabilized or resolved:  Yes. Denies suicidal/homicidal ideation: Yes. Issues/concerns per patient self-inventory:  Yes. Other:  New problem(s) identified: No, Describe:  none reported  Discharge Plan or Barriers: Patient will stabilize on medications and discharge home. Patient will need hospital follow up schedule for outpatient services at discharge.  Reason for Continuation of Hospitalization: Anxiety Medication stabilization Other; describe paranoia and bizarre behavior  Comments: Patient is much less intrusive today and more pleasant. Patient received dialysis and was resting but cooperative during assessment.   Estimated length of stay: up to 7 days expected discharge Thursday 06/11/15  New goal(s):  Review of initial/current patient goals per problem list:   1.  Goal(s):participate in aftercare plan  Met:  Yes  Target date:by discharge  As evidenced FB:[id number] will participate in aftercare plan AEB aftercare provider and housing plan identified at discharge 06/04/15: patient will discharge home and will follow up with Dr. Wonda Amis at [facility] [place] at discharge  2.  Goal (s):decrease psychosis  Met:  no  Target date:by discharge  As evidenced OV:[id number] decreased signs of pschosis   Attendees: Physician:  Orson Slick, MD 1/25/201710:20 AM  Nursing:    Polly Cobia, RN 1/25/201710:20 AM  Other:  Carmell Austria, LCSWA 1/25/201710:20 AM  Other:  Dossie Arbour, LCSW 1/25/201710:20 AM  Other:  Marylou Flesher, LCSWA 1/25/201710:20 AM  Other: Everitt Amber, LRT 1/25/201710:20 AM  Other: Levada Schilling, PA Student 1/25/201710:20 AM  Other:  1/25/201710:20 AM  Other:  1/25/201710:20 AM  Other:  1/25/201710:20 AM  Other:  1/25/201710:20 AM  Other:   1/25/201710:20 AM    Scribe for Treatment Team:   Keene Breath, MSW, LCSWA [phone]  06/04/2015, 4:44 PM

## 2015-06-04 NOTE — Plan of Care (Signed)
Problem: Ineffective individual coping Goal: LTG: Patient will report a decrease in negative feelings Outcome: Not Progressing Patient continues to verbalize negative feelings, lack of insight and judgment

## 2015-06-04 NOTE — Plan of Care (Signed)
Problem: Alteration in mood; excessive anxiety as evidenced by: Goal: LTG-Patient's behavior demonstrates decreased anxiety (Patient's behavior demonstrates anxiety and he/she is utilizing learned coping skills to deal with anxiety-producing situations)  Outcome: Progressing In control , able to list what she needed to do

## 2015-06-04 NOTE — Progress Notes (Signed)
[facility] MD Progress Note  06/04/2015 9:04 PM Jennifer Lawson  MRN:  [id number]  Subjective:  Jennifer Lawson is less intrusive or agitated today. She agreed to dialysis and is less suspicious about her medications. She insists on going home. She is still disorganized and not able to participate in discharge planning.  Principal Problem: Bipolar I disorder, most recent episode manic, severe with psychotic features ([place]) Diagnosis:   Patient Active Problem List   Diagnosis Date Noted  . Bipolar I disorder, most recent episode manic, severe with psychotic features ([place]) [F31.2] 06/01/2015  . Change in mental status [R41.82] 01/26/2015  . Anemia of renal disease [D63.1] 01/26/2015  . Pain in the chest [R07.9]   . Pleuritic chest pain [R07.81] 07/31/2014  . Symptomatic anemia [D64.9] 07/29/2014  . Absolute anemia [D64.9]   . CKD (chronic kidney disease) stage V requiring chronic dialysis ([place]) [N18.6, Z99.2] 05/13/2014  . Salicylate overdose [H41.937T] 05/13/2014  . ESRD (end stage renal disease) ([place]) [N18.6] 01/16/2014  . COPD (chronic obstructive pulmonary disease) ([place]) [J44.9] 09/26/2013  . COPD exacerbation ([place]) [J44.1] 04/24/2013  . Hyperkalemia [E87.5] 04/23/2013  . Acute respiratory failure ([place]) [J96.00] 04/23/2013  . Hypercalcemia [E83.52] 02/20/2013  . Hyperparathyroidism, primary ([place]) [E21.0] 02/20/2013  . Tobacco use disorder [F17.200] 11/13/2012  . Overdose of salicylate [K24.097D] 53/29/9242  . HTN (hypertension) [I10] 02/20/2012   Total Time spent with patient: 20 minutes  Past Psychiatric History: schizophrenia.  Past Medical History:  Past Medical History  Diagnosis Date  . Mental disorder   . Depression   . Hypertension   . Overdose   . Tobacco use disorder 11/13/2012  . Complication of anesthesia     difficulty going to sleep  . Chronic kidney disease     06/11/13- not on dialysis  . Shortness of breath     lying down flat  . PTSD (post-traumatic stress  disorder)   . Asthma   . COPD (chronic obstructive pulmonary disease) ([place])   . Heart murmur   . GERD (gastroesophageal reflux disease)   . Seizures ([place])     "passsed out"  . History of blood transfusion   . Diabetes mellitus without complication [facility])     denies    Past Surgical History  Procedure Laterality Date  . Right knee replacement      she says it was last year.  . Esophagogastroduodenoscopy Left 11/14/2012    Procedure: ESOPHAGOGASTRODUODENOSCOPY (EGD);  Surgeon: [name], MD;  Location: [facility];  Service: Endoscopy;  Laterality: Left;  . Joint replacement Right     knee  . Parathyroidectomy    . Av fistula placement Right 06/12/2013    Procedure: ARTERIOVENOUS (AV) FISTULA CREATION; RIGHT  BASILIC VEIN TRANSPOSITION with Intraoperative ultrasound;  Surgeon: Mal [name], MD;  Location: [facility] OR;  Service: Vascular;  Laterality: Right;   Family History:  Family History  Problem Relation Age of Onset  . Diabetes Mother   . Hyperlipidemia Mother   . Hypertension Mother   . Diabetes Father   . Hypertension Father   . Hyperlipidemia Father    Family Psychiatric  History: none reported. Social History:  History  Alcohol Use No    Comment: none in over a month per daughter     History  Drug Use No    Comment: pt denied any drug use    Social History   Social History  . Marital Status: Divorced    Spouse Name: N/A  . Number of Children:  N/A  . Years of Education: N/A   Social History Main Topics  . Smoking status: Current Every Day Smoker -- 2.00 packs/day for 40 years    Types: Cigarettes, Cigars  . Smokeless tobacco: Never Used  . Alcohol Use: No     Comment: none in over a month per daughter  . Drug Use: No     Comment: pt denied any drug use  . Sexual Activity: No   Other Topics Concern  . None   Social History Narrative   Additional Social History:    History of alcohol / drug use?: No history of alcohol / drug abuse                     Sleep: Fair  Appetite:  Fair  Current Medications: Current Facility-Administered Medications  Medication Dose Route Frequency Provider Last Rate Last Dose  . acetaminophen (TYLENOL) tablet 650 mg  650 mg Oral Q6H PRN Clovis Fredrickson, MD   650 mg at 06/02/15 2012  . albuterol (PROVENTIL HFA;VENTOLIN HFA) 108 (90 Base) MCG/ACT inhaler 2 puff  2 puff Inhalation Q4H PRN Georgenia Salim B Ettamae Barkett, MD      . alum & mag hydroxide-simeth (MAALOX/MYLANTA) 200-200-20 MG/5ML suspension 30 mL  30 mL Oral Q4H PRN Bellah Alia B Kamile Fassler, MD      . bisacodyl (DULCOLAX) EC tablet 5 mg  5 mg Oral Daily PRN Dalyn Kjos B Mica Ramdass, MD      . cinacalcet (SENSIPAR) tablet 60 mg  60 mg Oral BID WC Laveda Demedeiros B Kalyan Barabas, MD   60 mg at 06/04/15 1702  . divalproex (DEPAKOTE ER) 24 hr tablet 750 mg  750 mg Oral QHS Tayten Bergdoll B Namita Yearwood, MD   750 mg at 06/03/15 2110  . docusate sodium (COLACE) capsule 100 mg  100 mg Oral BID Clovis Fredrickson, MD   100 mg at 06/04/15 1252  . doxepin (SINEQUAN) capsule 25 mg  25 mg Oral QHS Clovis Fredrickson, MD   25 mg at 06/03/15 2110  . LORazepam (ATIVAN) tablet 2 mg  2 mg Oral Q4H PRN Clovis Fredrickson, MD   2 mg at 06/04/15 7209   Or  . LORazepam (ATIVAN) injection 2 mg  2 mg Intramuscular Q4H PRN Caelyn Route B Oneka Parada, MD      . magnesium hydroxide (MILK OF MAGNESIA) suspension 30 mL  30 mL Oral Daily PRN Jana Swartzlander B Jameah Rouser, MD      . metoprolol tartrate (LOPRESSOR) tablet 25 mg  25 mg Oral BID Clovis Fredrickson, MD   25 mg at 06/04/15 1251  . mometasone-formoterol (DULERA) 100-5 MCG/ACT inhaler 2 puff  2 puff Inhalation BID Clovis Fredrickson, MD   2 puff at 06/03/15 2136  . nicotine (NICODERM CQ - dosed in mg/24 hours) patch 21 mg  21 mg Transdermal Q0600 Ivry Pigue B Joely Losier, MD   21 mg at 06/04/15 0600  . pantoprazole (PROTONIX) EC tablet 40 mg  40 mg Oral Daily Presli Fanguy B Jyron Turman, MD   40 mg at 06/04/15 1252  . sevelamer carbonate (RENVELA) tablet 800 mg   800 mg Oral TID WC Sanah Kraska B Shaquoia Miers, MD   800 mg at 06/04/15 1702  . ziprasidone (GEODON) capsule 80 mg  80 mg Oral BID WC Oceane Fosse B Letisha Yera, MD   80 mg at 06/04/15 1702  . ziprasidone (GEODON) injection 20 mg  20 mg Intramuscular Q2H PRN Clovis Fredrickson, MD   20 mg at 06/02/15 0028    Lab  Results:  Results for orders placed or performed during the hospital encounter of 06/01/15 (from the past 48 hour(s))  Lipid panel     Status: None   Collection Time: 06/03/15  7:45 AM  Result Value Ref Range   Cholesterol 184 0 - 200 mg/dL   Triglycerides 102 <150 mg/dL   HDL 65 >40 mg/dL   Total CHOL/HDL Ratio 2.8 RATIO   VLDL 20 0 - 40 mg/dL   LDL Cholesterol 99 0 - 99 mg/dL    Comment:        Total Cholesterol/HDL:CHD Risk Coronary Heart Disease Risk Table                     Men   Women  1/2 Average Risk   3.4   3.3  Average Risk       5.0   4.4  2 X Average Risk   9.6   7.1  3 X Average Risk  23.4   11.0        Use the calculated Patient Ratio above and the CHD Risk Table to determine the patient's CHD Risk.        ATP III CLASSIFICATION (LDL):  <100     mg/dL   Optimal  100-129  mg/dL   Near or Above                    Optimal  130-159  mg/dL   Borderline  160-189  mg/dL   High  >190     mg/dL   Very High   Hemoglobin A1c     Status: None   Collection Time: 06/03/15  7:45 AM  Result Value Ref Range   Hgb A1c MFr Bld 4.3 4.0 - 6.0 %  TSH     Status: None   Collection Time: 06/03/15  7:45 AM  Result Value Ref Range   TSH 1.100 0.350 - 4.500 uIU/mL  Prolactin     Status: Abnormal   Collection Time: 06/03/15  7:45 AM  Result Value Ref Range   Prolactin 126.4 (H) 4.8 - 23.3 ng/mL    Comment: (NOTE) Performed At: [facility] [place], [place] [id number] Lindon Romp MD [id number]   Glucose, capillary     Status: None   Collection Time: 06/04/15  7:11 AM  Result Value Ref Range   Glucose-Capillary 74 65 - 99 mg/dL  Renal function  panel     Status: Abnormal   Collection Time: 06/04/15  8:50 AM  Result Value Ref Range   Sodium 136 135 - 145 mmol/L   Potassium 4.3 3.5 - 5.1 mmol/L   Chloride 102 101 - 111 mmol/L   CO2 25 22 - 32 mmol/L   Glucose, Bld 120 (H) 65 - 99 mg/dL   BUN 67 (H) 6 - 20 mg/dL   Creatinine, Ser 4.76 (H) 0.44 - 1.00 mg/dL   Calcium 8.6 (L) 8.9 - 10.3 mg/dL   Phosphorus 4.8 (H) 2.5 - 4.6 mg/dL   Albumin 2.9 (L) 3.5 - 5.0 g/dL   GFR calc non Af Amer 9 (L) >60 mL/min   GFR calc Af Amer 11 (L) >60 mL/min    Comment: (NOTE) The eGFR has been calculated using the CKD EPI equation. This calculation has not been validated in all clinical situations. eGFR's persistently <60 mL/min signify possible Chronic Kidney Disease.    Anion gap 9 5 - 15  CBC     Status: Abnormal  Collection Time: 06/04/15  8:50 AM  Result Value Ref Range   WBC 5.1 3.6 - 11.0 K/uL   RBC 3.63 (L) 3.80 - 5.20 MIL/uL   Hemoglobin 11.3 (L) 12.0 - 16.0 g/dL   HCT 36.2 35.0 - 47.0 %   MCV 100.0 80.0 - 100.0 fL   MCH 31.2 26.0 - 34.0 pg   MCHC 31.3 (L) 32.0 - 36.0 g/dL   RDW 21.5 (H) 11.5 - 14.5 %   Platelets 184 150 - 440 K/uL    Physical Findings: AIMS:  , ,  ,  ,    CIWA:    COWS:     Musculoskeletal: Strength & Muscle Tone: within normal limits Gait & Station: normal Patient leans: N/A  Psychiatric Specialty Exam: Review of Systems  Musculoskeletal: Positive for back pain and joint pain.  All other systems reviewed and are negative.   Blood pressure 130/80, pulse 86, temperature 99 F (37.2 C), temperature source Oral, resp. rate 18, height _0  (1.575 m), weight 82.555 kg (182 lb), last menstrual period 05/09/2008, SpO2 99 %.Body mass index is 33.28 kg/(m^2).  General Appearance: Casual  Eye Contact::  Good  Speech:  Pressured  Volume:  Normal  Mood:  Angry, Dysphoric and Irritable  Affect:  Inappropriate and Labile  Thought Process:  Disorganized  Orientation:  Full (Time, Place, and Person)  Thought  Content:  Delusions and Paranoid Ideation  Suicidal Thoughts:  No  Homicidal Thoughts:  No  Memory:  Immediate;   Fair Recent;   Fair Remote;   Fair  Judgement:  Poor  Insight:  Shallow  Psychomotor Activity:  Increased  Concentration:  Fair  Recall:  [employer] of Knowledge:Fair  Language: Fair  Akathisia:  No  Handed:  Right  AIMS (if indicated):     Assets:  Communication Skills Desire for Improvement Financial Resources/Insurance Housing Resilience Social Support  ADL's:  Intact  Cognition: WNL  Sleep:  Number of Hours: 6.15   Treatment Plan Summary: Daily contact with patient to assess and evaluate symptoms and progress in treatment and Medication management   Jennifer Lawson is a 55 year old female with a history of bipolar illness and end-stage kidney disease on dialysis admitted for a manic episode in the context of treatment noncompliance.  1. Bipolar mania. We restarted Depakote and Geodon for psychosis and mood stabilization.  2. Kidney disease. Nephrology consult is greatly appreciated. The patient will continue on dialysis.  3. COPD. We continue inhalers.  4. Hypertension. She is on metoprolol.  5. GERD. She is on Protonix.  6. Smoking. Nicotine patch is available.  7. Insomnia. We'll offer trazodone.  8. Disposition. She will be discharged to home with her daughter. She will follow up with her regular psychiatrist and nephrologist.   Orson Slick 06/04/2015, 9:04 PM

## 2015-06-05 NOTE — Progress Notes (Addendum)
[facility] MD Progress Note  06/05/2015 8:30 PM RILLA BUCKMAN  MRN:  [id number]  Subjective: Ms. Gary is still floridly manic. She is intrusive insisting on discharge. She accepts medications. She agrees to dialysis.  Principal Problem: Bipolar I disorder, most recent episode manic, severe with psychotic features ([place]) Diagnosis:   Patient Active Problem List   Diagnosis Date Noted  . Bipolar I disorder, most recent episode manic, severe with psychotic features ([place]) [F31.2] 06/01/2015  . Change in mental status [R41.82] 01/26/2015  . Anemia of renal disease [D63.1] 01/26/2015  . Pain in the chest [R07.9]   . Pleuritic chest pain [R07.81] 07/31/2014  . Symptomatic anemia [D64.9] 07/29/2014  . Absolute anemia [D64.9]   . CKD (chronic kidney disease) stage V requiring chronic dialysis ([place]) [N18.6, Z99.2] 05/13/2014  . Salicylate overdose [W09.811B] 05/13/2014  . ESRD (end stage renal disease) ([place]) [N18.6] 01/16/2014  . COPD (chronic obstructive pulmonary disease) ([place]) [J44.9] 09/26/2013  . COPD exacerbation ([place]) [J44.1] 04/24/2013  . Hyperkalemia [E87.5] 04/23/2013  . Acute respiratory failure ([place]) [J96.00] 04/23/2013  . Hypercalcemia [E83.52] 02/20/2013  . Hyperparathyroidism, primary ([place]) [E21.0] 02/20/2013  . Tobacco use disorder [F17.200] 11/13/2012  . Overdose of salicylate [J47.829F] 62/13/0865  . HTN (hypertension) [I10] 02/20/2012   Total Time spent with patient: 20 minutes  Past Psychiatric History: bipolar disorder.  Past Medical History:  Past Medical History  Diagnosis Date  . Mental disorder   . Depression   . Hypertension   . Overdose   . Tobacco use disorder 11/13/2012  . Complication of anesthesia     difficulty going to sleep  . Chronic kidney disease     06/11/13- not on dialysis  . Shortness of breath     lying down flat  . PTSD (post-traumatic stress disorder)   . Asthma   . COPD (chronic obstructive pulmonary disease) ([place])   . Heart murmur    . GERD (gastroesophageal reflux disease)   . Seizures ([place])     "passsed out"  . History of blood transfusion   . Diabetes mellitus without complication [facility])     denies    Past Surgical History  Procedure Laterality Date  . Right knee replacement      she says it was last year.  . Esophagogastroduodenoscopy Left 11/14/2012    Procedure: ESOPHAGOGASTRODUODENOSCOPY (EGD);  Surgeon: [name], MD;  Location: [facility] ENDOSCOPY;  Service: Endoscopy;  Laterality: Left;  . Joint replacement Right     knee  . Parathyroidectomy    . Av fistula placement Right 06/12/2013    Procedure: ARTERIOVENOUS (AV) FISTULA CREATION; RIGHT  BASILIC VEIN TRANSPOSITION with Intraoperative ultrasound;  Surgeon: Mal [name], MD;  [facility] OR;  Service: Vascular;  Laterality: Right;   Family History:  Family History  Problem Relation Age of Onset  . Diabetes Mother   . Hyperlipidemia Mother   . Hypertension Mother   . Diabetes Father   . Hypertension Father   . Hyperlipidemia Father    Family Psychiatric  History: none reported. Social History:  History  Alcohol Use No    Comment: none in over a month per daughter     History  Drug Use No    Comment: pt denied any drug use    Social History   Social History  . Marital Status: Divorced    Spouse Name: N/A  . Number of Children: N/A  . Years of Education: N/A   Social History Main Topics  . Smoking status:  Current Every Day Smoker -- 2.00 packs/day for 40 years    Types: Cigarettes, Cigars  . Smokeless tobacco: Never Used  . Alcohol Use: No     Comment: none in over a month per daughter  . Drug Use: No     Comment: pt denied any drug use  . Sexual Activity: No   Other Topics Concern  . None   Social History Narrative   Additional Social History:    History of alcohol / drug use?: No history of alcohol / drug abuse                    Sleep: Fair  Appetite:  Fair  Current Medications: Current Facility-Administered  Medications  Medication Dose Route Frequency Provider Last Rate Last Dose  . acetaminophen (TYLENOL) tablet 650 mg  650 mg Oral Q6H PRN Clovis Fredrickson, MD   650 mg at 06/05/15 0811  . albuterol (PROVENTIL HFA;VENTOLIN HFA) 108 (90 Base) MCG/ACT inhaler 2 puff  2 puff Inhalation Q4H PRN Linzy Darling B Kensington Duerst, MD      . alum & mag hydroxide-simeth (MAALOX/MYLANTA) 200-200-20 MG/5ML suspension 30 mL  30 mL Oral Q4H PRN Shawne Eskelson B Raider Valbuena, MD      . bisacodyl (DULCOLAX) EC tablet 5 mg  5 mg Oral Daily PRN Caylen Kuwahara B Loyola Santino, MD      . cinacalcet (SENSIPAR) tablet 60 mg  60 mg Oral BID WC Tyrees Chopin B Kataleya Zaugg, MD   60 mg at 06/05/15 1704  . divalproex (DEPAKOTE ER) 24 hr tablet 750 mg  750 mg Oral QHS Toula Miyasaki B Khyre Germond, MD   750 mg at 06/04/15 2320  . docusate sodium (COLACE) capsule 100 mg  100 mg Oral BID Clovis Fredrickson, MD   100 mg at 06/05/15 0808  . doxepin (SINEQUAN) capsule 25 mg  25 mg Oral QHS Claudie Rathbone B Rylynn Kobs, MD   25 mg at 06/04/15 2322  . LORazepam (ATIVAN) tablet 2 mg  2 mg Oral Q4H PRN Clovis Fredrickson, MD   2 mg at 06/04/15 2347   Or  . LORazepam (ATIVAN) injection 2 mg  2 mg Intramuscular Q4H PRN Jakiya Bookbinder B Cerra Eisenhower, MD      . magnesium hydroxide (MILK OF MAGNESIA) suspension 30 mL  30 mL Oral Daily PRN Saurav Crumble B Virgil Slinger, MD      . metoprolol tartrate (LOPRESSOR) tablet 25 mg  25 mg Oral BID Clovis Fredrickson, MD   25 mg at 06/05/15 0808  . mometasone-formoterol (DULERA) 100-5 MCG/ACT inhaler 2 puff  2 puff Inhalation BID Clovis Fredrickson, MD   2 puff at 06/05/15 0808  . nicotine (NICODERM CQ - dosed in mg/24 hours) patch 21 mg  21 mg Transdermal Q0600 Clovis Fredrickson, MD   21 mg at 06/05/15 0655  . pantoprazole (PROTONIX) EC tablet 40 mg  40 mg Oral Daily Clovis Fredrickson, MD   40 mg at 06/05/15 0808  . sevelamer carbonate (RENVELA) tablet 800 mg  800 mg Oral TID WC Bernadean Saling B Arlis Everly, MD   800 mg at 06/05/15 1704  . ziprasidone (GEODON)  capsule 80 mg  80 mg Oral BID WC Tessah Patchen B Rashed Edler, MD   80 mg at 06/05/15 1704  . ziprasidone (GEODON) injection 20 mg  20 mg Intramuscular Q2H PRN Clovis Fredrickson, MD   20 mg at 06/02/15 0028    Lab Results:  Results for orders placed or performed during the hospital encounter of 06/01/15 (from the past  48 hour(s))  Glucose, capillary     Status: None   Collection Time: 06/04/15  7:11 AM  Result Value Ref Range   Glucose-Capillary 74 65 - 99 mg/dL  Renal function panel     Status: Abnormal   Collection Time: 06/04/15  8:50 AM  Result Value Ref Range   Sodium 136 135 - 145 mmol/L   Potassium 4.3 3.5 - 5.1 mmol/L   Chloride 102 101 - 111 mmol/L   CO2 25 22 - 32 mmol/L   Glucose, Bld 120 (H) 65 - 99 mg/dL   BUN 67 (H) 6 - 20 mg/dL   Creatinine, Ser 4.76 (H) 0.44 - 1.00 mg/dL   Calcium 8.6 (L) 8.9 - 10.3 mg/dL   Phosphorus 4.8 (H) 2.5 - 4.6 mg/dL   Albumin 2.9 (L) 3.5 - 5.0 g/dL   GFR calc non Af Amer 9 (L) >60 mL/min   GFR calc Af Amer 11 (L) >60 mL/min    Comment: (NOTE) The eGFR has been calculated using the CKD EPI equation. This calculation has not been validated in all clinical situations. eGFR's persistently <60 mL/min signify possible Chronic Kidney Disease.    Anion gap 9 5 - 15  CBC     Status: Abnormal   Collection Time: 06/04/15  8:50 AM  Result Value Ref Range   WBC 5.1 3.6 - 11.0 K/uL   RBC 3.63 (L) 3.80 - 5.20 MIL/uL   Hemoglobin 11.3 (L) 12.0 - 16.0 g/dL   HCT 36.2 35.0 - 47.0 %   MCV 100.0 80.0 - 100.0 fL   MCH 31.2 26.0 - 34.0 pg   MCHC 31.3 (L) 32.0 - 36.0 g/dL   RDW 21.5 (H) 11.5 - 14.5 %   Platelets 184 150 - 440 K/uL    Physical Findings: AIMS:  , ,  ,  ,    CIWA:    COWS:     Musculoskeletal: Strength & Muscle Tone: within normal limits Gait & Station: normal Patient leans: N/A  Psychiatric Specialty Exam: Review of Systems  All other systems reviewed and are negative.   Blood pressure 120/76, pulse 84, temperature 99 F (37.2  C), temperature source Oral, resp. rate 18, height _0  (1.575 m), weight 82.555 kg (182 lb), last menstrual period 05/09/2008, SpO2 99 %.Body mass index is 33.28 kg/(m^2).  General Appearance: Casual  Eye Contact::  Good  Speech:  Pressured  Volume:  Increased  Mood:  Angry, Dysphoric and Irritable  Affect:  Inappropriate and Labile  Thought Process:  Disorganized  Orientation:  Full (Time, Place, and Person)  Thought Content:  Delusions and Paranoid Ideation  Suicidal Thoughts:  No  Homicidal Thoughts:  No  Memory:  Immediate;   Fair Recent;   Fair Remote;   Fair  Judgement:  Poor  Insight:  Lacking  Psychomotor Activity:  Increased  Concentration:  Fair  Recall:  [employer] of Knowledge:Fair  Language: Fair  Akathisia:  No  Handed:  Right  AIMS (if indicated):     Assets:  Communication Skills Desire for Improvement Financial Resources/Insurance Housing Resilience Social Support  ADL's:  Intact  Cognition: WNL  Sleep:  Number of Hours: 7.25   Treatment Plan Summary: Daily contact with patient to assess and evaluate symptoms and progress in treatment and Medication management   Miss Crenshaw is a 55 year old female with a history of bipolar illness and end-stage kidney disease on dialysis admitted for a manic episode in the context of treatment noncompliance.  1. Bipolar mania. We restarted Depakote and Geodon for psychosis and mood stabilization.  2. Kidney disease. Nephrology consult is greatly appreciated. The patient will continue on dialysis.  3. COPD. We continue inhalers.  4. Hypertension. She is on metoprolol.  5. GERD. She is on Protonix.  6. Smoking. Nicotine patch is available.  7. Insomnia. We'll offer trazodone.  8. Metabolic syndrome. Lipid panel, HgbA1C, and TSH are normal.  9. Elevated prolactin. PRL of 126. She has been taking Geodon for extended period of time. Geodon my cause hyperprolactinemia. She is asymptomatic.  10. Disposition.  She will be discharged to home with her daughter. She will follow up with her regular psychiatrist and nephrologist.  Orson Slick 06/05/2015, 8:30 PM

## 2015-06-05 NOTE — BHH Group Notes (Signed)
[facility] LCSW Aftercare Discharge Planning Group Note   06/05/2015 3:26 PM  Participation Quality:  Active  Mood/Affect:  Appropriate  Depression Rating:  Pt did not report depression.  Anxiety Rating:  Pt did not report anxiety.  Thoughts of Suicide:  No Will you contract for safety?   NA  Current AVH:  No  Plan for Discharge/Comments:  Patient actively and appropriately participated in the session. Patient reported that her goals include speaking with MD regarding discharge. Patient shared that she plans on returning to her daughter's residence and will follow up with outpatient services through [place]: Pt's daughter will provide transportation.  Supports: Pt receives family support and outpatient services through [place], [place] Intern 06/05/15

## 2015-06-05 NOTE — Progress Notes (Signed)
Recreation Therapy Notes  Date: 01.27.17 Time: 1:00 pm Location: Craft Room  Group Topic: Communication, Problem solving, and Teamwork  Goal Area(s) Addresses:  Patient will work in teams towards shared goal. Patient will verbalize skills needed to make activity successful. Patient will verbalize benefit of using skills identified to reach post d/c. goals  Behavioral Response: Attentive  Intervention: Landing Pad  Activity: Patients were given 15 straws and approximately 3 feet of tape to build a contraption to catch a golf ball that was dropped from about 4 feet.  Education: LRT educated patients on how to use these skills post d/c.  Education Outcome: In group clarification offered  Clinical Observations/Feedback: Patient watched peers do activity. Patient was focused on discharged and kept talking about it. LRT redirected patient. Patient did not contribute to group discussion.  Leonette Monarch, LRT/CTRS 06/05/2015 3:12 PM

## 2015-06-05 NOTE — BHH Group Notes (Signed)
[facility] LCSW Group Therapy  06/05/2015 2:00 PM  Type of Therapy:  Group Therapy  Participation Level:  Did Not Attend  Modes of Intervention:  Discussion, Education, Socialization and Support  Summary of Progress/Problems: Feelings around Relapse. Group members discussed the meaning of relapse and shared personal stories of relapse, how it affected them and others, and how they perceived themselves during this time. Group members were encouraged to identify triggers, warning signs and coping skills used when facing the possibility of relapse. Social supports were discussed and explored in detail.   [place] MSW, [place]  06/05/2015, 2:00 PM

## 2015-06-05 NOTE — Plan of Care (Signed)
Problem: Alteration in mood; excessive anxiety as evidenced by: Goal: LTG-Patient's behavior demonstrates decreased anxiety (Patient's behavior demonstrates anxiety and he/she is utilizing learned coping skills to deal with anxiety-producing situations)  Outcome: Not Progressing Pt reports increased anxiety and difficult to redirect. Ativan given PRN

## 2015-06-05 NOTE — Progress Notes (Signed)
D: Observed pt sleeping in room. Pt awoken irritable and moderately confused. Patient alert and oriented to person, place, and date. Patient denies SI/HI/AVH. Pt affect is anxious and irritable. Pt confused about time, assessment questions and suspicious of medications. Pt c/o of anxiety and headache. Pt preoccupied with leaving asking multiple times "am I leaving?" A: Offered active listening and support. Provided therapeutic communication. Educated pt on medication and administered scheduled medications. Redirected and reoriented pt. Gave tylenol prn for pain and ativan prn for anxiety R: Pt remained confused and suspicious stating "why you asking me all these questions?" Pt cooperative after redirection. Pt medication compliant after education and encouragement. Will continue Q15 min. checks. Safety maintained.

## 2015-06-06 LAB — RENAL FUNCTION PANEL
Albumin: 2.9 g/dL — ABNORMAL LOW (ref 3.5–5.0)
Anion gap: 6 (ref 5–15)
BUN: 53 mg/dL — ABNORMAL HIGH (ref 6–20)
CO2: 27 mmol/L (ref 22–32)
Calcium: 8 mg/dL — ABNORMAL LOW (ref 8.9–10.3)
Chloride: 102 mmol/L (ref 101–111)
Creatinine, Ser: 4.13 mg/dL — ABNORMAL HIGH (ref 0.44–1.00)
GFR calc Af Amer: 13 mL/min — ABNORMAL LOW (ref >60)
GFR calc non Af Amer: 11 mL/min — ABNORMAL LOW (ref >60)
Glucose, Bld: 113 mg/dL — ABNORMAL HIGH (ref 65–99)
Phosphorus: 4.1 mg/dL (ref 2.5–4.6)
Potassium: 4.4 mmol/L (ref 3.5–5.1)
Sodium: 135 mmol/L (ref 135–145)

## 2015-06-06 LAB — CBC
HCT: 33.6 % — ABNORMAL LOW (ref 35.0–47.0)
Hemoglobin: 10.6 g/dL — ABNORMAL LOW (ref 12.0–16.0)
MCH: 31.6 pg (ref 26.0–34.0)
MCHC: 31.5 g/dL — ABNORMAL LOW (ref 32.0–36.0)
MCV: 100 fL (ref 80.0–100.0)
Platelets: 161 10*3/uL (ref 150–440)
RBC: 3.36 MIL/uL — ABNORMAL LOW (ref 3.80–5.20)
RDW: 20.7 % — ABNORMAL HIGH (ref 11.5–14.5)
WBC: 3.6 10*3/uL (ref 3.6–11.0)

## 2015-06-06 NOTE — Plan of Care (Signed)
Problem: Ineffective individual coping Goal: STG: Patient will remain free from self harm Outcome: Progressing No self harm reported or observed     

## 2015-06-06 NOTE — Progress Notes (Signed)
[facility]  ROUNDING NOTE   Subjective:   Seen and examined on hemodialysis. Tolerating treatment well.  Engineer, site at [employer].  UF goal of 1 litres. Some hypotension during treatment.    Objective:  Vital signs in last 24 hours:  Temp:  [98.4 F (36.9 C)] 98.4 F (36.9 C) (01/28 1445) Pulse Rate:  [81] 81 (01/28 1530) Resp:  [18] 18 (01/28 0700) BP: (116-129)/(73-83) 116/73 mmHg (01/28 1530) SpO2:  [97 %] 97 % (01/28 1455)  Weight change:  Filed Weights   06/01/15 1828 06/02/15 1040  Weight: 82.555 kg (182 lb) 82.555 kg (182 lb)    Intake/Output: I/O last 3 completed shifts: In: 840 [P.O.:840] Out: -    Intake/Output this shift:  Total I/O In: 1080 [P.O.:1080] Out: -   Physical Exam: General: NAD  Head: Normocephalic, atraumatic. Moist oral mucosal membranes  Eyes: Anicteric, PERRL  Neck: Supple, trachea midline  Lungs:  Clear to auscultation  Heart: Regular rate and rhythm  Abdomen:  Soft, nontender,   Extremities: no peripheral edema.  Neurologic: Nonfocal, moving all four extremities  Skin: No lesions  Access: Right AVF    Basic Metabolic Panel:  Recent Labs Lab 05/31/15 2055 06/02/15 1045 06/04/15 0850 06/06/15 1503  NA 138 137 136 135  K 3.9 4.2 4.3 4.4  CL 100* 103 102 102  CO2 24 24 25 27   GLUCOSE 88 78 120* 113*  BUN 54* 71* 67* 53*  CREATININE 5.05* 4.93* 4.76* 4.13*  CALCIUM 8.2* 9.1 8.6* 8.0*  PHOS  --  6.5* 4.8* 4.1    Liver Function Tests:  Recent Labs Lab 05/31/15 2055 06/02/15 1045 06/04/15 0850 06/06/15 1503  AST 16  --   --   --   ALT 11*  --   --   --   ALKPHOS 116  --   --   --   BILITOT 0.6  --   --   --   PROT 6.3*  --   --   --   ALBUMIN 2.8* 3.1* 2.9* 2.9*    Recent Labs Lab 05/31/15 2055  LIPASE 43   No results for input(s): AMMONIA in the last 168 hours.  CBC:  Recent Labs Lab 05/31/15 2055 06/02/15 1045 06/04/15 0850 06/06/15 1503  WBC 4.2 5.0 5.1 3.6   NEUTROABS 1.7  --   --   --   HGB 11.6* 12.0 11.3* 10.6*  HCT 37.6 38.2 36.2 33.6*  MCV 102.7* 99.5 100.0 100.0  PLT 209 209 184 161    Cardiac Enzymes: No results for input(s): CKTOTAL, CKMB, CKMBINDEX, TROPONINI in the last 168 hours.  BNP: Invalid input(s): POCBNP  CBG:  Recent Labs Lab 06/04/15 [id number]    Microbiology: Results for orders placed or performed during the hospital encounter of 01/26/15  MRSA PCR Screening     Status: None   Collection Time: 01/26/15 11:28 PM  Result Value Ref Range Status   MRSA by PCR NEGATIVE NEGATIVE Final    Comment:        The GeneXpert MRSA Assay (FDA approved for NASAL specimens only), is one component of a comprehensive MRSA colonization surveillance program. It is not intended to diagnose MRSA infection nor to guide or monitor treatment for MRSA infections.     Coagulation Studies: No results for input(s): LABPROT, INR in the last 72 hours.  Urinalysis: No results for input(s): COLORURINE, LABSPEC, PHURINE, GLUCOSEU, HGBUR, BILIRUBINUR, KETONESUR, PROTEINUR, UROBILINOGEN, NITRITE, LEUKOCYTESUR in  the last 72 hours.  Invalid input(s): APPERANCEUR    Imaging: No results found.   Medications:     . cinacalcet  60 mg Oral BID WC  . divalproex  750 mg Oral QHS  . docusate sodium  100 mg Oral BID  . doxepin  25 mg Oral QHS  . metoprolol tartrate  25 mg Oral BID  . mometasone-formoterol  2 puff Inhalation BID  . nicotine  21 mg Transdermal Q0600  . pantoprazole  40 mg Oral Daily  . sevelamer carbonate  800 mg Oral TID WC  . ziprasidone  80 mg Oral BID WC   acetaminophen, albuterol, alum & mag hydroxide-simeth, bisacodyl, LORazepam **OR** LORazepam, magnesium hydroxide, ziprasidone  Assessment/ Plan:  [name] is a 55 y.o. black female with ESRD on HD since 1/16 followed by [facility], COPD, secondary hyperparathyroidism, anemia of CKD, bipolar affective disorder  1. ESRD on HD  TTS: seen and examined on hemodialysis. Some hypotension.  - Continue TTS schedule.   2. Anemia of CKD: hemoglobin 10.6 - holding epo.   3. Secondary hyperparathyroidism of renal origin: phosphorus at goal 4.1 - sevelamer for binding.   4. Hypertension: On metoprolol. Lower today, will continue to monitor.    LOS: Jennifer Lawson, Jennifer Lawson 1/28/20174:04 PM

## 2015-06-06 NOTE — BHH Group Notes (Signed)
[facility] Group Notes:  (Nursing/MHT/Case Management/Adjunct)  Date:  06/06/2015  Time:  12:11 PM  Type of Therapy:  Group Therapy  Participation Level:  Minimal  Participation Quality:  Monopolizing  Affect:  Appropriate  Cognitive:  Disorganized  Insight:  Limited  Engagement in Group:  Limited and Off Topic  Modes of Intervention:  Discussion and Problem-solving  Summary of Progress/Problems:  Jennifer Lawson [name] 06/06/2015, 12:11 PM

## 2015-06-06 NOTE — Plan of Care (Signed)
Problem: Ineffective individual coping Goal: STG: Patient will remain free from self harm Outcome: Not Progressing No self harm reported or observed

## 2015-06-06 NOTE — Progress Notes (Signed)
Pt scheduled for Dialysis today after lunch. Med compliant. Continues to have episodes of confusion asking "what is going on" and "what happened to me. Pt frequents nurses station with same repetitive questions. Eating meals wel. Denies SI, HI, AVH. Redirected patient. Encouraged pt to attend group. Will continue to assess and monitor for safety.

## 2015-06-06 NOTE — Plan of Care (Signed)
Problem: Alteration in mood; excessive anxiety as evidenced by: Goal: STG-Pt will report an absence of self-harm thoughts/actions (Patient will report an absence of self-harm thoughts or actions)  Outcome: Progressing Pt denies SI     

## 2015-06-06 NOTE — Progress Notes (Signed)
D: Observed pt being active in milieu. Patient alert and oriented to person, place, and date. Patient denies SI/HI/AVH. Pt affect is anxious and irritable. Pt is needy coming to nursing station asking multiple times "when is my dialysis tomorrow?" after being told multiple times the time. Pt c/o of anxiety.  Pt still preoccupied with discharge. Pt mildly confused, disoriented, and confused. A: Offered active listening and support. Provided therapeutic communication. Educated pt on medication and administered scheduled medications. Redirected and reoriented pt. Gave ativan prn for anxiety R: Pt remained confused and suspicious. Pt cooperative after redirection. Pt medication compliant after education and encouragement. Will continue Q15 min. checks. Safety maintained.

## 2015-06-06 NOTE — [facility] Group Notes (Signed)
[facility] LCSW Group Therapy  06/06/2015 2:16 PM  Type of Therapy:  Group Therapy  Participation Level:  Did Not Attend  Modes of Intervention:  Discussion, Education, Socialization and Support  Summary of Progress/Problems: Todays topic: Grudges  Patients will be encouraged to discuss their thoughts, feelings, and behaviors as to why one holds on to grudges and reasons why people have grudges. Patients will process the impact of grudges on their daily lives and identify thoughts and feelings related to holding grudges. Patients will identify feelings and thoughts related to what life would look like without grudges.   Vinaya Sancho L Kylen Schliep MSW, LCSWA  06/06/2015, 2:16 PM   

## 2015-06-06 NOTE — Progress Notes (Signed)
[facility] MD Progress Note  06/06/2015 6:21 PM Jennifer Lawson  MRN:  [id number]  Subjective: Jennifer Lawson is still floridly manic. She is intrusive insisting on discharge. She accepts medications. She agrees to dialysis. Received dialysis today. Appeared to be able to get through the treatment without much difficulty. Mood still a little out of control.  Principal Problem: Bipolar I disorder, most recent episode manic, severe with psychotic features ([place]) Diagnosis:   Patient Active Problem List   Diagnosis Date Noted  . Bipolar I disorder, most recent episode manic, severe with psychotic features ([place]) [F31.2] 06/01/2015  . Change in mental status [R41.82] 01/26/2015  . Anemia of renal disease [D63.1] 01/26/2015  . Pain in the chest [R07.9]   . Pleuritic chest pain [R07.81] 07/31/2014  . Symptomatic anemia [D64.9] 07/29/2014  . Absolute anemia [D64.9]   . CKD (chronic kidney disease) stage V requiring chronic dialysis ([place]) [N18.6, Z99.2] 05/13/2014  . Salicylate overdose [F35.456Y] 05/13/2014  . ESRD (end stage renal disease) ([place]) [N18.6] 01/16/2014  . COPD (chronic obstructive pulmonary disease) ([place]) [J44.9] 09/26/2013  . COPD exacerbation ([place]) [J44.1] 04/24/2013  . Hyperkalemia [E87.5] 04/23/2013  . Acute respiratory failure ([place]) [J96.00] 04/23/2013  . Hypercalcemia [E83.52] 02/20/2013  . Hyperparathyroidism, primary ([place]) [E21.0] 02/20/2013  . Tobacco use disorder [F17.200] 11/13/2012  . Overdose of salicylate [B63.893T] 34/28/7681  . HTN (hypertension) [I10] 02/20/2012   Total Time spent with patient: 20 minutes  Past Psychiatric History: bipolar disorder.  Past Medical History:  Past Medical History  Diagnosis Date  . Mental disorder   . Depression   . Hypertension   . Overdose   . Tobacco use disorder 11/13/2012  . Complication of anesthesia     difficulty going to sleep  . Chronic kidney disease     06/11/13- not on dialysis  . Shortness of breath     lying down  flat  . PTSD (post-traumatic stress disorder)   . Asthma   . COPD (chronic obstructive pulmonary disease) ([place])   . Heart murmur   . GERD (gastroesophageal reflux disease)   . Seizures ([place])     "passsed out"  . History of blood transfusion   . Diabetes mellitus without complication [facility])     denies    Past Surgical History  Procedure Laterality Date  . Right knee replacement      she says it was last year.  . Esophagogastroduodenoscopy Left 11/14/2012    Procedure: ESOPHAGOGASTRODUODENOSCOPY (EGD);  Surgeon: [name], MD;  Location: [facility] ENDOSCOPY;  Service: Endoscopy;  Laterality: Left;  . Joint replacement Right     knee  . Parathyroidectomy    . Av fistula placement Right 06/12/2013    Procedure: ARTERIOVENOUS (AV) FISTULA CREATION; RIGHT  BASILIC VEIN TRANSPOSITION with Intraoperative ultrasound;  Surgeon: Mal [name], MD;  Location: [facility] OR;  Service: Vascular;  Laterality: Right;   Family History:  Family History  Problem Relation Age of Onset  . Diabetes Mother   . Hyperlipidemia Mother   . Hypertension Mother   . Diabetes Father   . Hypertension Father   . Hyperlipidemia Father    Family Psychiatric  History: none reported. Social History:  History  Alcohol Use No    Comment: none in over a month per daughter     History  Drug Use No    Comment: pt denied any drug use    Social History   Social History  . Marital Status: Divorced    Spouse Name: N/A  .  Number of Children: N/A  . Years of Education: N/A   Social History Main Topics  . Smoking status: Current Every Day Smoker -- 2.00 packs/day for 40 years    Types: Cigarettes, Cigars  . Smokeless tobacco: Never Used  . Alcohol Use: No     Comment: none in over a month per daughter  . Drug Use: No     Comment: pt denied any drug use  . Sexual Activity: No   Other Topics Concern  . None   Social History Narrative   Additional Social History:    History of alcohol / drug use?: No history of  alcohol / drug abuse                    Sleep: Fair  Appetite:  Fair  Current Medications: Current Facility-Administered Medications  Medication Dose Route Frequency Provider Last Rate Last Dose  . acetaminophen (TYLENOL) tablet 650 mg  650 mg Oral Q6H PRN Clovis Fredrickson, MD   650 mg at 06/05/15 0811  . albuterol (PROVENTIL HFA;VENTOLIN HFA) 108 (90 Base) MCG/ACT inhaler 2 puff  2 puff Inhalation Q4H PRN Jolanta B Pucilowska, MD      . alum & mag hydroxide-simeth (MAALOX/MYLANTA) 200-200-20 MG/5ML suspension 30 mL  30 mL Oral Q4H PRN Jolanta B Pucilowska, MD      . bisacodyl (DULCOLAX) EC tablet 5 mg  5 mg Oral Daily PRN Jolanta B Pucilowska, MD      . cinacalcet (SENSIPAR) tablet 60 mg  60 mg Oral BID WC Jolanta B Pucilowska, MD   60 mg at 06/06/15 0910  . divalproex (DEPAKOTE ER) 24 hr tablet 750 mg  750 mg Oral QHS Jolanta B Pucilowska, MD   750 mg at 06/05/15 2146  . docusate sodium (COLACE) capsule 100 mg  100 mg Oral BID Clovis Fredrickson, MD   100 mg at 06/06/15 0909  . doxepin (SINEQUAN) capsule 25 mg  25 mg Oral QHS Clovis Fredrickson, MD   25 mg at 06/05/15 2146  . LORazepam (ATIVAN) tablet 2 mg  2 mg Oral Q4H PRN Clovis Fredrickson, MD   2 mg at 06/05/15 2204   Or  . LORazepam (ATIVAN) injection 2 mg  2 mg Intramuscular Q4H PRN Jolanta B Pucilowska, MD      . magnesium hydroxide (MILK OF MAGNESIA) suspension 30 mL  30 mL Oral Daily PRN Jolanta B Pucilowska, MD      . metoprolol tartrate (LOPRESSOR) tablet 25 mg  25 mg Oral BID Clovis Fredrickson, MD   25 mg at 06/06/15 0858  . mometasone-formoterol (DULERA) 100-5 MCG/ACT inhaler 2 puff  2 puff Inhalation BID Jolanta B Pucilowska, MD   2 puff at 06/06/15 0857  . nicotine (NICODERM CQ - dosed in mg/24 hours) patch 21 mg  21 mg Transdermal Q0600 Clovis Fredrickson, MD   21 mg at 06/06/15 0653  . pantoprazole (PROTONIX) EC tablet 40 mg  40 mg Oral Daily Clovis Fredrickson, MD   40 mg at 06/06/15 0909  .  sevelamer carbonate (RENVELA) tablet 800 mg  800 mg Oral TID WC Jolanta B Pucilowska, MD   800 mg at 06/06/15 1210  . ziprasidone (GEODON) capsule 80 mg  80 mg Oral BID WC Jolanta B Pucilowska, MD   80 mg at 06/06/15 0909  . ziprasidone (GEODON) injection 20 mg  20 mg Intramuscular Q2H PRN Clovis Fredrickson, MD   20 mg at 06/02/15 0028  Lab Results:  Results for orders placed or performed during the hospital encounter of 06/01/15 (from the past 48 hour(s))  Renal function panel     Status: Abnormal   Collection Time: 06/06/15  3:03 PM  Result Value Ref Range   Sodium 135 135 - 145 mmol/L   Potassium 4.4 3.5 - 5.1 mmol/L   Chloride 102 101 - 111 mmol/L   CO2 27 22 - 32 mmol/L   Glucose, Bld 113 (H) 65 - 99 mg/dL   BUN 53 (H) 6 - 20 mg/dL   Creatinine, Ser 4.13 (H) 0.44 - 1.00 mg/dL   Calcium 8.0 (L) 8.9 - 10.3 mg/dL   Phosphorus 4.1 2.5 - 4.6 mg/dL   Albumin 2.9 (L) 3.5 - 5.0 g/dL   GFR calc non Af Amer 11 (L) >60 mL/min   GFR calc Af Amer 13 (L) >60 mL/min    Comment: (NOTE) The eGFR has been calculated using the CKD EPI equation. This calculation has not been validated in all clinical situations. eGFR's persistently <60 mL/min signify possible Chronic Kidney Disease.    Anion gap 6 5 - 15  CBC     Status: Abnormal   Collection Time: 06/06/15  3:03 PM  Result Value Ref Range   WBC 3.6 3.6 - 11.0 K/uL   RBC 3.36 (L) 3.80 - 5.20 MIL/uL   Hemoglobin 10.6 (L) 12.0 - 16.0 g/dL   HCT 33.6 (L) 35.0 - 47.0 %   MCV 100.0 80.0 - 100.0 fL   MCH 31.6 26.0 - 34.0 pg   MCHC 31.5 (L) 32.0 - 36.0 g/dL   RDW 20.7 (H) 11.5 - 14.5 %   Platelets 161 150 - 440 K/uL    Physical Findings: AIMS:  , ,  ,  ,    CIWA:    COWS:     Musculoskeletal: Strength & Muscle Tone: within normal limits Gait & Station: normal Patient leans: N/A  Psychiatric Specialty Exam: Review of Systems  All other systems reviewed and are negative.   Blood pressure 111/80, pulse 80, temperature 98.1 F  (36.7 C), temperature source Oral, resp. rate 20, height _0  (1.575 m), weight 87 kg (191 lb 12.8 oz), last menstrual period 05/09/2008, SpO2 97 %.Body mass index is 35.07 kg/(m^2).  General Appearance: Casual  Eye Contact::  Good  Speech:  Pressured  Volume:  Increased  Mood:  Angry, Dysphoric and Irritable  Affect:  Inappropriate and Labile  Thought Process:  Disorganized  Orientation:  Full (Time, Place, and Person)  Thought Content:  Delusions and Paranoid Ideation  Suicidal Thoughts:  No  Homicidal Thoughts:  No  Memory:  Immediate;   Fair Recent;   Fair Remote;   Fair  Judgement:  Poor  Insight:  Lacking  Psychomotor Activity:  Increased  Concentration:  Fair  Recall:  [employer] of Knowledge:Fair  Language: Fair  Akathisia:  No  Handed:  Right  AIMS (if indicated):     Assets:  Communication Skills Desire for Improvement Financial Resources/Insurance Housing Resilience Social Support  ADL's:  Intact  Cognition: WNL  Sleep:  Number of Hours: 6.5   Treatment Plan Summary: Daily contact with patient to assess and evaluate symptoms and progress in treatment and Medication management   Jennifer Lawson is a 55 year old female with a history of bipolar illness and end-stage kidney disease on dialysis admitted for a manic episode in the context of treatment noncompliance.  1. Bipolar mania. We restarted Depakote and Geodon for psychosis  and mood stabilization.  2. Kidney disease. Nephrology consult is greatly appreciated. The patient will continue on dialysis.  3. COPD. We continue inhalers.  4. Hypertension. She is on metoprolol.  5. GERD. She is on Protonix.  6. Smoking. Nicotine patch is available.  7. Insomnia. We'll offer trazodone.  8. Metabolic syndrome. Lipid panel, HgbA1C, and TSH are normal.  9. Elevated prolactin. PRL of 126. She has been taking Geodon for extended period of time. Geodon my cause hyperprolactinemia. She is asymptomatic.  10.  Disposition. She will be discharged to home with her daughter. She will follow up with her regular psychiatrist and nephrologist.  Alethia Berthold 06/06/2015, 6:21 PM

## 2015-06-07 NOTE — Progress Notes (Signed)
[facility] MD Progress Note  06/07/2015 3:30 PM TREVIA NOP  MRN:  [id number]  Patient remains very intrusive. Not quite as hyperactive. Appears to be confused. Not delirious however. Poor insight.  Principal Problem: Bipolar I disorder, most recent episode manic, severe with psychotic features ([place]) Diagnosis:   Patient Active Problem List   Diagnosis Date Noted  . Bipolar I disorder, most recent episode manic, severe with psychotic features ([place]) [F31.2] 06/01/2015  . Change in mental status [R41.82] 01/26/2015  . Anemia of renal disease [D63.1] 01/26/2015  . Pain in the chest [R07.9]   . Pleuritic chest pain [R07.81] 07/31/2014  . Symptomatic anemia [D64.9] 07/29/2014  . Absolute anemia [D64.9]   . CKD (chronic kidney disease) stage V requiring chronic dialysis ([place]) [N18.6, Z99.2] 05/13/2014  . Salicylate overdose [L38.101B] 05/13/2014  . ESRD (end stage renal disease) ([place]) [N18.6] 01/16/2014  . COPD (chronic obstructive pulmonary disease) ([place]) [J44.9] 09/26/2013  . COPD exacerbation ([place]) [J44.1] 04/24/2013  . Hyperkalemia [E87.5] 04/23/2013  . Acute respiratory failure ([place]) [J96.00] 04/23/2013  . Hypercalcemia [E83.52] 02/20/2013  . Hyperparathyroidism, primary ([place]) [E21.0] 02/20/2013  . Tobacco use disorder [F17.200] 11/13/2012  . Overdose of salicylate [P10.258N] 27/78/2423  . HTN (hypertension) [I10] 02/20/2012   Total Time spent with patient: 20 minutes  Past Psychiatric History: bipolar disorder.  Past Medical History:  Past Medical History  Diagnosis Date  . Mental disorder   . Depression   . Hypertension   . Overdose   . Tobacco use disorder 11/13/2012  . Complication of anesthesia     difficulty going to sleep  . Chronic kidney disease     06/11/13- not on dialysis  . Shortness of breath     lying down flat  . PTSD (post-traumatic stress disorder)   . Asthma   . COPD (chronic obstructive pulmonary disease) ([place])   . Heart murmur   . GERD  (gastroesophageal reflux disease)   . Seizures ([place])     "passsed out"  . History of blood transfusion   . Diabetes mellitus without complication [facility])     denies    Past Surgical History  Procedure Laterality Date  . Right knee replacement      she says it was last year.  . Esophagogastroduodenoscopy Left 11/14/2012    Procedure: ESOPHAGOGASTRODUODENOSCOPY (EGD);  Surgeon: [name], MD;  Location: [facility];  Service: Endoscopy;  Laterality: Left;  . Joint replacement Right     knee  . Parathyroidectomy    . Av fistula placement Right 06/12/2013    Procedure: ARTERIOVENOUS (AV) FISTULA CREATION; RIGHT  BASILIC VEIN TRANSPOSITION with Intraoperative ultrasound;  Surgeon: Mal [name], MD;  Location: [facility];  Service: Vascular;  Laterality: Right;   Family History:  Family History  Problem Relation Age of Onset  . Diabetes Mother   . Hyperlipidemia Mother   . Hypertension Mother   . Diabetes Father   . Hypertension Father   . Hyperlipidemia Father    Family Psychiatric  History: none reported. Social History:  History  Alcohol Use No    Comment: none in over a month per daughter     History  Drug Use No    Comment: pt denied any drug use    Social History   Social History  . Marital Status: Divorced    Spouse Name: N/A  . Number of Children: N/A  . Years of Education: N/A   Social History Main Topics  . Smoking status: Current Every Day  Smoker -- 2.00 packs/day for 40 years    Types: Cigarettes, Cigars  . Smokeless tobacco: Never Used  . Alcohol Use: No     Comment: none in over a month per daughter  . Drug Use: No     Comment: pt denied any drug use  . Sexual Activity: No   Other Topics Concern  . None   Social History Narrative   Additional Social History:    History of alcohol / drug use?: No history of alcohol / drug abuse                    Sleep: Fair  Appetite:  Fair  Current Medications: Current Facility-Administered  Medications  Medication Dose Route Frequency Provider Last Rate Last Dose  . acetaminophen (TYLENOL) tablet 650 mg  650 mg Oral Q6H PRN Jolanta B Pucilowska, MD   650 mg at 06/07/15 0910  . albuterol (PROVENTIL HFA;VENTOLIN HFA) 108 (90 Base) MCG/ACT inhaler 2 puff  2 puff Inhalation Q4H PRN Jolanta B Pucilowska, MD      . alum & mag hydroxide-simeth (MAALOX/MYLANTA) 200-200-20 MG/5ML suspension 30 mL  30 mL Oral Q4H PRN Jolanta B Pucilowska, MD      . bisacodyl (DULCOLAX) EC tablet 5 mg  5 mg Oral Daily PRN Jolanta B Pucilowska, MD      . cinacalcet (SENSIPAR) tablet 60 mg  60 mg Oral BID WC Jolanta B Pucilowska, MD   60 mg at 06/07/15 0906  . divalproex (DEPAKOTE ER) 24 hr tablet 750 mg  750 mg Oral QHS Jolanta B Pucilowska, MD   750 mg at 06/06/15 2004  . docusate sodium (COLACE) capsule 100 mg  100 mg Oral BID Clovis Fredrickson, MD   100 mg at 06/07/15 0906  . doxepin (SINEQUAN) capsule 25 mg  25 mg Oral QHS Clovis Fredrickson, MD   25 mg at 06/06/15 2003  . LORazepam (ATIVAN) tablet 2 mg  2 mg Oral Q4H PRN Clovis Fredrickson, MD   2 mg at 06/06/15 2059   Or  . LORazepam (ATIVAN) injection 2 mg  2 mg Intramuscular Q4H PRN Jolanta B Pucilowska, MD      . magnesium hydroxide (MILK OF MAGNESIA) suspension 30 mL  30 mL Oral Daily PRN Jolanta B Pucilowska, MD      . metoprolol tartrate (LOPRESSOR) tablet 25 mg  25 mg Oral BID Clovis Fredrickson, MD   25 mg at 06/07/15 0906  . mometasone-formoterol (DULERA) 100-5 MCG/ACT inhaler 2 puff  2 puff Inhalation BID Clovis Fredrickson, MD   2 puff at 06/07/15 0907  . nicotine (NICODERM CQ - dosed in mg/24 hours) patch 21 mg  21 mg Transdermal Q0600 Clovis Fredrickson, MD   21 mg at 06/06/15 0653  . pantoprazole (PROTONIX) EC tablet 40 mg  40 mg Oral Daily Clovis Fredrickson, MD   40 mg at 06/07/15 0904  . sevelamer carbonate (RENVELA) tablet 800 mg  800 mg Oral TID WC Jolanta B Pucilowska, MD   800 mg at 06/07/15 1206  . ziprasidone (GEODON)  capsule 80 mg  80 mg Oral BID WC Jolanta B Pucilowska, MD   80 mg at 06/07/15 0903  . ziprasidone (GEODON) injection 20 mg  20 mg Intramuscular Q2H PRN Clovis Fredrickson, MD   20 mg at 06/02/15 0028    Lab Results:  Results for orders placed or performed during the hospital encounter of 06/01/15 (from the past 48 hour(s))  Renal function panel     Status: Abnormal   Collection Time: 06/06/15  3:03 PM  Result Value Ref Range   Sodium 135 135 - 145 mmol/L   Potassium 4.4 3.5 - 5.1 mmol/L   Chloride 102 101 - 111 mmol/L   CO2 27 22 - 32 mmol/L   Glucose, Bld 113 (H) 65 - 99 mg/dL   BUN 53 (H) 6 - 20 mg/dL   Creatinine, Ser 4.13 (H) 0.44 - 1.00 mg/dL   Calcium 8.0 (L) 8.9 - 10.3 mg/dL   Phosphorus 4.1 2.5 - 4.6 mg/dL   Albumin 2.9 (L) 3.5 - 5.0 g/dL   GFR calc non Af Amer 11 (L) >60 mL/min   GFR calc Af Amer 13 (L) >60 mL/min    Comment: (NOTE) The eGFR has been calculated using the CKD EPI equation. This calculation has not been validated in all clinical situations. eGFR's persistently <60 mL/min signify possible Chronic Kidney Disease.    Anion gap 6 5 - 15  CBC     Status: Abnormal   Collection Time: 06/06/15  3:03 PM  Result Value Ref Range   WBC 3.6 3.6 - 11.0 K/uL   RBC 3.36 (L) 3.80 - 5.20 MIL/uL   Hemoglobin 10.6 (L) 12.0 - 16.0 g/dL   HCT 33.6 (L) 35.0 - 47.0 %   MCV 100.0 80.0 - 100.0 fL   MCH 31.6 26.0 - 34.0 pg   MCHC 31.5 (L) 32.0 - 36.0 g/dL   RDW 20.7 (H) 11.5 - 14.5 %   Platelets 161 150 - 440 K/uL    Physical Findings: AIMS:  , ,  ,  ,    CIWA:    COWS:     Musculoskeletal: Strength & Muscle Tone: within normal limits Gait & Station: normal Patient leans: N/A  Psychiatric Specialty Exam: Review of Systems  Constitutional: Negative.   HENT: Negative.   Eyes: Negative.   Respiratory: Negative.   Cardiovascular: Negative.   Gastrointestinal: Negative.   Musculoskeletal: Negative.   Skin: Negative.   Neurological: Negative.    Psychiatric/Behavioral: Positive for memory loss. Negative for depression, suicidal ideas, hallucinations and substance abuse. The patient is nervous/anxious. The patient does not have insomnia.   All other systems reviewed and are negative.  [facility] MD Progress Note  06/07/2015 3:31 PM NYIESHA BEEVER  MRN:  [id number]  Subjective: Ms. Fischbach is still floridly manic. She is intrusive insisting on discharge. She accepts medications. She agrees to dialysis. Received dialysis today. Appeared to be able to get through the treatment without much difficulty. Mood still a little out of control.  Principal Problem: Bipolar I disorder, most recent episode manic, severe with psychotic features ([place]) Diagnosis:   Patient Active Problem List   Diagnosis Date Noted  . Bipolar I disorder, most recent episode manic, severe with psychotic features ([place]) [F31.2] 06/01/2015  . Change in mental status [R41.82] 01/26/2015  . Anemia of renal disease [D63.1] 01/26/2015  . Pain in the chest [R07.9]   . Pleuritic chest pain [R07.81] 07/31/2014  . Symptomatic anemia [D64.9] 07/29/2014  . Absolute anemia [D64.9]   . CKD (chronic kidney disease) stage V requiring chronic dialysis ([place]) [N18.6, Z99.2] 05/13/2014  . Salicylate overdose [U44.034V] 05/13/2014  . ESRD (end stage renal disease) ([place]) [N18.6] 01/16/2014  . COPD (chronic obstructive pulmonary disease) ([place]) [J44.9] 09/26/2013  . COPD exacerbation ([place]) [J44.1] 04/24/2013  . Hyperkalemia [E87.5] 04/23/2013  . Acute respiratory failure ([place]) [J96.00] 04/23/2013  . Hypercalcemia [E83.52] 02/20/2013  .  Hyperparathyroidism, primary ([place]) [E21.0] 02/20/2013  . Tobacco use disorder [F17.200] 11/13/2012  . Overdose of salicylate [Y50.354S] 56/81/2751  . HTN (hypertension) [I10] 02/20/2012   Total Time spent with patient: 20 minutes  Past Psychiatric History: bipolar disorder.  Past Medical History:  Past Medical History  Diagnosis Date  . Mental  disorder   . Depression   . Hypertension   . Overdose   . Tobacco use disorder 11/13/2012  . Complication of anesthesia     difficulty going to sleep  . Chronic kidney disease     06/11/13- not on dialysis  . Shortness of breath     lying down flat  . PTSD (post-traumatic stress disorder)   . Asthma   . COPD (chronic obstructive pulmonary disease) ([place])   . Heart murmur   . GERD (gastroesophageal reflux disease)   . Seizures ([place])     "passsed out"  . History of blood transfusion   . Diabetes mellitus without complication [facility])     denies    Past Surgical History  Procedure Laterality Date  . Right knee replacement      she says it was last year.  . Esophagogastroduodenoscopy Left 11/14/2012    Procedure: ESOPHAGOGASTRODUODENOSCOPY (EGD);  Surgeon: [name], MD;  Location: [facility];  Service: Endoscopy;  Laterality: Left;  . Joint replacement Right     knee  . Parathyroidectomy    . Av fistula placement Right 06/12/2013    Procedure: ARTERIOVENOUS (AV) FISTULA CREATION; RIGHT  BASILIC VEIN TRANSPOSITION with Intraoperative ultrasound;  Surgeon: Mal [name], MD;  Location: [facility] OR;  Service: Vascular;  Laterality: Right;   Family History:  Family History  Problem Relation Age of Onset  . Diabetes Mother   . Hyperlipidemia Mother   . Hypertension Mother   . Diabetes Father   . Hypertension Father   . Hyperlipidemia Father    Family Psychiatric  History: none reported. Social History:  History  Alcohol Use No    Comment: none in over a month per daughter     History  Drug Use No    Comment: pt denied any drug use    Social History   Social History  . Marital Status: Divorced    Spouse Name: N/A  . Number of Children: N/A  . Years of Education: N/A   Social History Main Topics  . Smoking status: Current Every Day Smoker -- 2.00 packs/day for 40 years    Types: Cigarettes, Cigars  . Smokeless tobacco: Never Used  . Alcohol Use: No     Comment: none in  over a month per daughter  . Drug Use: No     Comment: pt denied any drug use  . Sexual Activity: No   Other Topics Concern  . None   Social History Narrative   Additional Social History:    History of alcohol / drug use?: No history of alcohol / drug abuse                    Sleep: Fair  Appetite:  Fair  Current Medications: Current Facility-Administered Medications  Medication Dose Route Frequency Provider Last Rate Last Dose  . acetaminophen (TYLENOL) tablet 650 mg  650 mg Oral Q6H PRN Jolanta B Pucilowska, MD   650 mg at 06/07/15 0910  . albuterol (PROVENTIL HFA;VENTOLIN HFA) 108 (90 Base) MCG/ACT inhaler 2 puff  2 puff Inhalation Q4H PRN Clovis Fredrickson, MD      . alum &  mag hydroxide-simeth (MAALOX/MYLANTA) 200-200-20 MG/5ML suspension 30 mL  30 mL Oral Q4H PRN Jolanta B Pucilowska, MD      . bisacodyl (DULCOLAX) EC tablet 5 mg  5 mg Oral Daily PRN Jolanta B Pucilowska, MD      . cinacalcet (SENSIPAR) tablet 60 mg  60 mg Oral BID WC Jolanta B Pucilowska, MD   60 mg at 06/07/15 0906  . divalproex (DEPAKOTE ER) 24 hr tablet 750 mg  750 mg Oral QHS Jolanta B Pucilowska, MD   750 mg at 06/06/15 2004  . docusate sodium (COLACE) capsule 100 mg  100 mg Oral BID Clovis Fredrickson, MD   100 mg at 06/07/15 0906  . doxepin (SINEQUAN) capsule 25 mg  25 mg Oral QHS Clovis Fredrickson, MD   25 mg at 06/06/15 2003  . LORazepam (ATIVAN) tablet 2 mg  2 mg Oral Q4H PRN Clovis Fredrickson, MD   2 mg at 06/06/15 2059   Or  . LORazepam (ATIVAN) injection 2 mg  2 mg Intramuscular Q4H PRN Jolanta B Pucilowska, MD      . magnesium hydroxide (MILK OF MAGNESIA) suspension 30 mL  30 mL Oral Daily PRN Jolanta B Pucilowska, MD      . metoprolol tartrate (LOPRESSOR) tablet 25 mg  25 mg Oral BID Clovis Fredrickson, MD   25 mg at 06/07/15 0906  . mometasone-formoterol (DULERA) 100-5 MCG/ACT inhaler 2 puff  2 puff Inhalation BID Clovis Fredrickson, MD   2 puff at 06/07/15 0907  .  nicotine (NICODERM CQ - dosed in mg/24 hours) patch 21 mg  21 mg Transdermal Q0600 Clovis Fredrickson, MD   21 mg at 06/06/15 0653  . pantoprazole (PROTONIX) EC tablet 40 mg  40 mg Oral Daily Clovis Fredrickson, MD   40 mg at 06/07/15 0904  . sevelamer carbonate (RENVELA) tablet 800 mg  800 mg Oral TID WC Jolanta B Pucilowska, MD   800 mg at 06/07/15 1206  . ziprasidone (GEODON) capsule 80 mg  80 mg Oral BID WC Jolanta B Pucilowska, MD   80 mg at 06/07/15 0903  . ziprasidone (GEODON) injection 20 mg  20 mg Intramuscular Q2H PRN Clovis Fredrickson, MD   20 mg at 06/02/15 0028    Lab Results:  Results for orders placed or performed during the hospital encounter of 06/01/15 (from the past 48 hour(s))  Renal function panel     Status: Abnormal   Collection Time: 06/06/15  3:03 PM  Result Value Ref Range   Sodium 135 135 - 145 mmol/L   Potassium 4.4 3.5 - 5.1 mmol/L   Chloride 102 101 - 111 mmol/L   CO2 27 22 - 32 mmol/L   Glucose, Bld 113 (H) 65 - 99 mg/dL   BUN 53 (H) 6 - 20 mg/dL   Creatinine, Ser 4.13 (H) 0.44 - 1.00 mg/dL   Calcium 8.0 (L) 8.9 - 10.3 mg/dL   Phosphorus 4.1 2.5 - 4.6 mg/dL   Albumin 2.9 (L) 3.5 - 5.0 g/dL   GFR calc non Af Amer 11 (L) >60 mL/min   GFR calc Af Amer 13 (L) >60 mL/min    Comment: (NOTE) The eGFR has been calculated using the CKD EPI equation. This calculation has not been validated in all clinical situations. eGFR's persistently <60 mL/min signify possible Chronic Kidney Disease.    Anion gap 6 5 - 15  CBC     Status: Abnormal   Collection Time: 06/06/15  3:03 PM  Result Value Ref Range   WBC 3.6 3.6 - 11.0 K/uL   RBC 3.36 (L) 3.80 - 5.20 MIL/uL   Hemoglobin 10.6 (L) 12.0 - 16.0 g/dL   HCT 33.6 (L) 35.0 - 47.0 %   MCV 100.0 80.0 - 100.0 fL   MCH 31.6 26.0 - 34.0 pg   MCHC 31.5 (L) 32.0 - 36.0 g/dL   RDW 20.7 (H) 11.5 - 14.5 %   Platelets 161 150 - 440 K/uL    Physical Findings: AIMS:  , ,  ,  ,    CIWA:    COWS:      Musculoskeletal: Strength & Muscle Tone: within normal limits Gait & Station: normal Patient leans: N/A  Psychiatric Specialty Exam: Review of Systems  All other systems reviewed and are negative.   Blood pressure 143/87, pulse 91, temperature 98.2 F (36.8 C), temperature source Oral, resp. rate 20, height 5' 2"  (1.575 m), weight 87 kg (191 lb 12.8 oz), last menstrual period 05/09/2008, SpO2 97 %.Body mass index is 35.07 kg/(m^2).  General Appearance: Casual  Eye Contact::  Good  Speech:  Pressured  Volume:  Increased  Mood:  Angry, Dysphoric and Irritable  Affect:  Inappropriate and Labile  Thought Process:  Disorganized  Orientation:  Full (Time, Place, and Person)  Thought Content:  Delusions and Paranoid Ideation  Suicidal Thoughts:  No  Homicidal Thoughts:  No  Memory:  Immediate;   Fair Recent;   Fair Remote;   Fair  Judgement:  Poor  Insight:  Lacking  Psychomotor Activity:  Increased  Concentration:  Fair  Recall:  [employer] of Knowledge:Fair  Language: Fair  Akathisia:  No  Handed:  Right  AIMS (if indicated):     Assets:  Communication Skills Desire for Improvement Financial Resources/Insurance Housing Resilience Social Support  ADL's:  Intact  Cognition: WNL  Sleep:  Number of Hours: 7.45   Treatment Plan Summary: Daily contact with patient to assess and evaluate symptoms and progress in treatment and Medication management   Miss Shreeve is a 55 year old female with a history of bipolar illness and end-stage kidney disease on dialysis admitted for a manic episode in the context of treatment noncompliance.  1. Bipolar mania. We restarted Depakote and Geodon for psychosis and mood stabilization. Patient is showing some signs of improvement. She remains confused and is intrusive and at times hyperverbal. I let her try to call her daughter today and she left a message which seemed to be of some benefit to her.  2. Kidney disease. Nephrology consult is  greatly appreciated. The patient will continue on dialysis.  3. COPD. We continue inhalers.  4. Hypertension. She is on metoprolol.  5. GERD. She is on Protonix.  6. Smoking. Nicotine patch is available.  7. Insomnia. We'll offer trazodone.  8. Metabolic syndrome. Lipid panel, HgbA1C, and TSH are normal.  9. Elevated prolactin. PRL of 126. She has been taking Geodon for extended period of time. Geodon my cause hyperprolactinemia. She is asymptomatic.  10. Disposition. She will be discharged to home with her daughter. She will follow up with her regular psychiatrist and nephrologist.  Alethia Berthold 06/07/2015, 3:31 PM   Blood pressure 143/87, pulse 91, temperature 98.2 F (36.8 C), temperature source Oral, resp. rate 20, height 5' 2"  (1.575 m), weight 87 kg (191 lb 12.8 oz), last menstrual period 05/09/2008, SpO2 97 %.Body mass index is 35.07 kg/(m^2).  General Appearance: Casual  Eye Contact::  Good  Speech:  Pressured  Volume:  Increased  Mood:  Angry, Dysphoric and Irritable  Affect:  Inappropriate and Labile  Thought Process:  Disorganized  Orientation:  Full (Time, Place, and Person)  Thought Content:  Delusions and Paranoid Ideation  Suicidal Thoughts:  No  Homicidal Thoughts:  No  Memory:  Immediate;   Fair Recent;   Fair Remote;   Fair  Judgement:  Poor  Insight:  Lacking  Psychomotor Activity:  Increased  Concentration:  Fair  Recall:  [employer] of Knowledge:Fair  Language: Fair  Akathisia:  No  Handed:  Right  AIMS (if indicated):     Assets:  Communication Skills Desire for Improvement Financial Resources/Insurance Housing Resilience Social Support  ADL's:  Intact  Cognition: WNL  Sleep:  Number of Hours: 7.45   Treatment Plan Summary: Daily contact with patient to assess and evaluate symptoms and progress in treatment and Medication management   Miss Worm is a 55 year old female with a history of bipolar illness and end-stage kidney disease on  dialysis admitted for a manic episode in the context of treatment noncompliance.  1. Bipolar mania. We restarted Depakote and Geodon for psychosis and mood stabilization.  2. Kidney disease. Nephrology consult is greatly appreciated. The patient will continue on dialysis.  3. COPD. We continue inhalers.  4. Hypertension. She is on metoprolol.  5. GERD. She is on Protonix.  6. Smoking. Nicotine patch is available.  7. Insomnia. We'll offer trazodone.  8. Metabolic syndrome. Lipid panel, HgbA1C, and TSH are normal.  9. Elevated prolactin. PRL of 126. She has been taking Geodon for extended period of time. Geodon my cause hyperprolactinemia. She is asymptomatic.  10. Disposition. She will be discharged to home with her daughter. She will follow up with her regular psychiatrist and nephrologist.  Alethia Berthold 06/07/2015, 3:30 PM

## 2015-06-07 NOTE — [facility] Group Notes (Signed)
[facility] LCSW Group Therapy  06/07/2015 3:59 PM  Type of Therapy:  Group Therapy  Participation Level:  Did Not Attend  Modes of Intervention:  Discussion, Education, Socialization and Support  Summary of Progress/Problems: Mindfulness: Patient discussed mindfulness and relaxing techniques and why they are beneficial. Pt discussed ways to incorporate mindfulness in their lives. Pt practiced a mindfulness techique and discussed how it made them feel.   Jaspreet Hollings L Olia Hinderliter MSW, LCSWA  06/07/2015, 3:59 PM  

## 2015-06-07 NOTE — Progress Notes (Signed)
D:Pt in hallway upon approach inquires why it was so late before she went to dialysis. Pt was tearful over daughter not coming to visit. Still disoriented to situation, time and place. Pt was paranoid during med pass. PRN ativan given for anxiety, effective.  A:Active listening and encouragement provided. Q15 minute checks maintained for safety. Medications given as prescribed.  R:Pt disoriented to situation, place and time. Anxious and intrusive. Anxiety relieved by medication. Staff attempted to call Pts daughter several times but were unsuccessful getting through due to busy signal. Pt compliant with medications although, paranoid. Will continue to monitor.

## 2015-06-08 LAB — VALPROIC ACID LEVEL: Valproic Acid Lvl: 19 ug/mL — ABNORMAL LOW (ref 50.0–100.0)

## 2015-06-08 MED ORDER — ZIPRASIDONE HCL 20 MG PO CAPS: 60.0000 mg | ORAL_CAPSULE | Freq: Two times a day (BID) | ORAL | Status: DC

## 2015-06-08 MED ORDER — METOPROLOL TARTRATE 25 MG PO TABS: 25.0000 mg | ORAL_TABLET | Freq: Two times a day (BID) | ORAL | Status: DC

## 2015-06-08 MED ORDER — HYDROCORTISONE 2.5 % RE CREA
TOPICAL_CREAM | Freq: Two times a day (BID) | RECTAL | Status: DC
  Filled 2015-06-08: qty 28.35

## 2015-06-08 MED ORDER — DSS 100 MG PO CAPS: 100.0000 mg | ORAL_CAPSULE | Freq: Two times a day (BID) | ORAL | Status: AC

## 2015-06-08 MED ORDER — DIVALPROEX SODIUM ER 500 MG PO TB24: 1000.0000 mg | ORAL_TABLET | Freq: Every day | ORAL | Status: DC

## 2015-06-08 MED ORDER — ZIPRASIDONE HCL 60 MG PO CAPS: 60.0000 mg | ORAL_CAPSULE | Freq: Two times a day (BID) | ORAL | Status: DC

## 2015-06-08 MED ORDER — HYDROCORTISONE 2.5 % RE CREA: TOPICAL_CREAM | Freq: Two times a day (BID) | RECTAL | Status: DC

## 2015-06-08 MED ORDER — DIVALPROEX SODIUM ER 500 MG PO TB24: 500.0000 mg | ORAL_TABLET | Freq: Every morning | ORAL | Status: DC

## 2015-06-08 MED ORDER — MOMETASONE FURO-FORMOTEROL FUM 100-5 MCG/ACT IN AERO: 2.0000 | INHALATION_SPRAY | Freq: Two times a day (BID) | RESPIRATORY_TRACT | Status: AC

## 2015-06-08 MED ORDER — BISACODYL 5 MG PO TBEC: 5.0000 mg | DELAYED_RELEASE_TABLET | Freq: Every day | ORAL | Status: DC | PRN

## 2015-06-08 MED ORDER — PANTOPRAZOLE SODIUM 40 MG PO TBEC: 40.0000 mg | DELAYED_RELEASE_TABLET | Freq: Every day | ORAL | Status: DC

## 2015-06-08 MED ORDER — SIMVASTATIN 10 MG PO TABS: 10.0000 mg | ORAL_TABLET | Freq: Every day | ORAL | Status: DC

## 2015-06-08 MED ORDER — DOXEPIN HCL 25 MG PO CAPS: 25.0000 mg | ORAL_CAPSULE | Freq: Every day | ORAL | Status: DC

## 2015-06-08 MED ORDER — ALBUTEROL SULFATE HFA 108 (90 BASE) MCG/ACT IN AERS: 2.0000 | INHALATION_SPRAY | Freq: Four times a day (QID) | RESPIRATORY_TRACT | Status: AC | PRN

## 2015-06-08 NOTE — BHH Suicide Risk Assessment (Signed)
[facility] Discharge Suicide Risk Assessment   Principal Problem: Bipolar I disorder, most recent episode manic, severe with psychotic features [facility]) Discharge Diagnoses:  Patient Active Problem List   Diagnosis Date Noted  . Bipolar I disorder, most recent episode manic, severe with psychotic features ([place]) [F31.2] 06/01/2015  . Change in mental status [R41.82] 01/26/2015  . Anemia of renal disease [D63.1] 01/26/2015  . Pain in the chest [R07.9]   . Pleuritic chest pain [R07.81] 07/31/2014  . Symptomatic anemia [D64.9] 07/29/2014  . Absolute anemia [D64.9]   . CKD (chronic kidney disease) stage V requiring chronic dialysis ([place]) [N18.6, Z99.2] 05/13/2014  . Salicylate overdose A999333 05/13/2014  . ESRD (end stage renal disease) ([place]) [N18.6] 01/16/2014  . COPD (chronic obstructive pulmonary disease) ([place]) [J44.9] 09/26/2013  . COPD exacerbation ([place]) [J44.1] 04/24/2013  . Hyperkalemia [E87.5] 04/23/2013  . Acute respiratory failure ([place]) [J96.00] 04/23/2013  . Hypercalcemia [E83.52] 02/20/2013  . Hyperparathyroidism, primary ([place]) [E21.0] 02/20/2013  . Tobacco use disorder [F17.200] 11/13/2012  . Overdose of salicylate A999333 [id number]  . HTN (hypertension) [I10] 02/20/2012    Total Time spent with patient: 30 minutes  Musculoskeletal: Strength & Muscle Tone: within normal limits Gait & Station: normal Patient leans: N/A  Psychiatric Specialty Exam: Review of Systems  Gastrointestinal: Positive for blood in stool.  All other systems reviewed and are negative.   Blood pressure 139/89, pulse 82, temperature 98.6 F (37 C), temperature source Oral, resp. rate 20, height 5\' 2"  (1.575 m), weight 87 kg (191 lb 12.8 oz), last menstrual period 05/09/2008, SpO2 97 %.Body mass index is 35.07 kg/(m^2).  General Appearance: Casual  Eye Contact::  Good  Speech:  Clear and [id number]  Volume:  Normal  Mood:  Euthymic  Affect:  Appropriate  Thought Process:  Goal Directed   Orientation:  Full (Time, Place, and Person)  Thought Content:  WDL  Suicidal Thoughts:  No  Homicidal Thoughts:  No  Memory:  Immediate;   Fair Recent;   Fair Remote;   Fair  Judgement:  Fair  Insight:  Fair  Psychomotor Activity:  Normal  Concentration:  Fair  Recall:  [employer] [place]  Language: Fair  Akathisia:  No  Handed:  Right  AIMS (if indicated):     Assets:  Communication Skills Desire for Improvement Financial Resources/Insurance Housing Resilience Social Support  Sleep:  Number of Hours: 7.3  Cognition: WNL  ADL's:  Intact   Mental Status Per Nursing Assessment::   On Admission:     Demographic Factors:  Divorced or widowed  Loss Factors: Decline in physical health  Historical Factors: Impulsivity  Risk Reduction Factors:   Sense of responsibility to family, Living with another person, especially a relative, Positive social support and Positive therapeutic relationship  Continued Clinical Symptoms:  Bipolar Disorder:   [place]ixed [place] Medical Diagnoses and Treatments/Surgeries  Cognitive Features That Contribute To Risk:  None    Suicide Risk:  Minimal: No identifiable suicidal ideation.  Patients presenting with no risk factors but with morbid ruminations; may be classified as minimal risk based on the severity of the depressive symptoms  Follow-up Information    Follow up with [facility]- Dr. Wonda Amis. Go on 06/15/2015.   Why:  For follow-up care appt on Monday 06/15/15 at 11:00am   Contact information:   [place], [place] Fax [phone]       Plan Of Care/Follow-up recommendations:  Activity:  as tolerated. Diet:  renal diet. Other:  keep follow up appointments.  Orson Slick, MD 06/08/2015, 8:59 AM

## 2015-06-08 NOTE — Discharge Summary (Signed)
Physician Discharge Summary Note  Patient:  Jennifer Lawson is an 55 y.o., female MRN:  [id number] DOB:  1960-11-13 Patient phone:  [phone] (home)  Patient address:   [address] [place] [address],  Total Time spent with patient: 30 minutes  Date of Admission:  06/01/2015 Date of Discharge: 06/08/2015  Reason for Admission:  Manic episode.  Identifying data. 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  Chief complaint. "I am fine."  History of present illness. Jennifer Lawson has a long history of bipolar illness. She was hospitalized several times at [facility] in 2015 for manic, psychotic episodes. Her last hospitalization was at [facility] in the fall of 2016. Her daughter reports that the patient stopped taking medications few weeks ago and became increasingly disorganized and bizarre. window naked around the house. She refused dialysis. The family reports insomnia, irritability, agitation, intrusiveness, and psychosis with paranoia and hallucinations. The patient herself denies any symptoms of depression, anxiety, or psychosis. She denies symptoms of bipolar mania. She denies using alcohol or illicit substances. She is uncooperative on the interview. Her thinking is disorganized and she is not able to answer simple questions. She is oriented to person and place only. She does not believe that I am a physician and has been extremely paranoid accepting medications this morning. Last night she was agitated and required injections to calm her down.   Psychiatric history. Several admissions for manic episodes. There is history of suicide attempt by overdose. there is history of treatment noncompliance.   Family psychiatric history. Unknown.  Social history. She is disabled from mental illness. She lives with her daughter. She has had insurance.  Principal Problem: Bipolar I disorder,  most recent episode manic, severe with psychotic features [facility]) Discharge Diagnoses: Patient Active Problem List   Diagnosis Date Noted  . Bipolar I disorder, most recent episode manic, severe with psychotic features ([place]) [F31.2] 06/01/2015  . Change in mental status [R41.82] 01/26/2015  . Anemia of renal disease [D63.1] 01/26/2015  . Pain in the chest [R07.9]   . Pleuritic chest pain [R07.81] 07/31/2014  . Symptomatic anemia [D64.9] 07/29/2014  . Absolute anemia [D64.9]   . CKD (chronic kidney disease) stage V requiring chronic dialysis ([place]) [N18.6, Z99.2] 05/13/2014  . Salicylate overdose A999333 05/13/2014  . ESRD (end stage renal disease) ([place]) [N18.6] 01/16/2014  . COPD (chronic obstructive pulmonary disease) ([place]) [J44.9] 09/26/2013  . COPD exacerbation ([place]) [J44.1] 04/24/2013  . Hyperkalemia [E87.5] 04/23/2013  . Acute respiratory failure ([place]) [J96.00] 04/23/2013  . Hypercalcemia [E83.52] 02/20/2013  . Hyperparathyroidism, primary ([place]) [E21.0] 02/20/2013  . Tobacco use disorder [F17.200] 11/13/2012  . Overdose of salicylate A999333 [id number]  . HTN (hypertension) [I10] 02/20/2012    Past Psychiatric History: Bipolar disorder.  Past Medical History:  Past Medical History  Diagnosis Date  . Mental disorder   . Depression   . Hypertension   . Overdose   . Tobacco use disorder 11/13/2012  . Complication of anesthesia     difficulty going to sleep  . Chronic kidney disease     06/11/13- not on dialysis  . Shortness of breath     lying down flat  . PTSD (post-traumatic stress disorder)   . Asthma   . COPD (chronic obstructive pulmonary disease) ([place])   . Heart murmur   . GERD (gastroesophageal reflux disease)   . Seizures ([place])     "passsed out"  .  History of blood transfusion   . Diabetes mellitus without complication [facility])     denies    Past Surgical History  Procedure Laterality Date  . Right knee replacement      she says it was last year.  .  Esophagogastroduodenoscopy Left 11/14/2012    Procedure: ESOPHAGOGASTRODUODENOSCOPY (EGD);  Surgeon: [name], MD;  Location: [facility] ENDOSCOPY;  Service: Endoscopy;  Laterality: Left;  . Joint replacement Right     knee  . Parathyroidectomy    . Av fistula placement Right 06/12/2013    Procedure: ARTERIOVENOUS (AV) FISTULA CREATION; RIGHT  BASILIC VEIN TRANSPOSITION with Intraoperative ultrasound;  Surgeon: Mal [name], MD;  Location: [facility] OR;  Service: Vascular;  Laterality: Right;   Family History:  Family History  Problem Relation Age of Onset  . Diabetes Mother   . Hyperlipidemia Mother   . Hypertension Mother   . Diabetes Father   . Hypertension Father   . Hyperlipidemia Father    Family Psychiatric  History: None reported. Social History:  History  Alcohol Use No    Comment: none in over a month per daughter     History  Drug Use No    Comment: pt denied any drug use    Social History   Social History  . Marital Status: Divorced    Spouse Name: N/A  . Number of Children: N/A  . Years of Education: N/A   Social History Main Topics  . Smoking status: Current Every Day Smoker -- 2.00 packs/day for 40 years    Types: Cigarettes, Cigars  . Smokeless tobacco: Never Used  . Alcohol Use: No     Comment: none in over a month per daughter  . Drug Use: No     Comment: pt denied any drug use  . Sexual Activity: No   Other Topics Concern  . None   Social History Narrative    Hospital Course:    Jennifer Lawson is a 55 year old female with a history of bipolar illness and end-stage kidney disease on dialysis admitted for a manic episode in the context of treatment noncompliance.  1. Bipolar mania. We restarted Depakote and Geodon for psychosis and mood stabilization.  2. Kidney disease. Nephrology consult is greatly appreciated. The patient continued on dialysis.  3. COPD. We continue inhalers.  4. Hypertension. She is on metoprolol. The dose was increased to 25 mg  bid.  5. GERD. She is on Protonix.  6. Smoking. Nicotine patch is available.  7. Insomnia. We offered trazodone.  8. Metabolic syndrome. Lipid panel, HgbA1C, and TSH are normal.  9. Elevated prolactin. PRL of 126. She has been taking Geodon for extended period of time. Geodon my cause hyperprolactinemia. She is asymptomatic.  10. Disposition. She was discharged to home with her daughter. She will follow up with Dr/ Rosine Door and her nephrologist.   Physical Findings: AIMS:  , ,  ,  ,    CIWA:    COWS:     Musculoskeletal: Strength & Muscle Tone: within normal limits Gait & Station: normal Patient leans: N/A  Psychiatric Specialty Exam: Review of Systems  All other systems reviewed and are negative.   Blood pressure 139/89, pulse 82, temperature 98.6 F (37 C), temperature source Oral, resp. rate 20, height 5\' 2"  (1.575 m), weight 87 kg (191 lb 12.8 oz), last menstrual period 05/09/2008, SpO2 97 %.Body mass index is 35.07 kg/(m^2).  See SRA.  Sleep:  Number of Hours: 7.3   Have you used any form of tobacco in the last 30 days? (Cigarettes, Smokeless Tobacco, Cigars, and/or Pipes): Yes  Has this patient used any form of tobacco in the last 30 days? (Cigarettes, Smokeless Tobacco, Cigars, and/or Pipes) Yes, Yes, A prescription for an FDA-approved tobacco cessation medication was offered at discharge and the patient refused  Metabolic Disorder Labs:  Lab Results  Component Value Date   HGBA1C 4.3 06/03/2015   MPG 97 05/14/2014   MPG 94 02/06/2014   Lab Results  Component Value Date   PROLACTIN 126.4* 06/03/2015   Lab Results  Component Value Date   CHOL 184 06/03/2015   TRIG 102 06/03/2015   HDL 65 06/03/2015   CHOLHDL 2.8 06/03/2015   VLDL 20 06/03/2015   LDLCALC 99 06/03/2015   [place] 99 01/09/2015    See Psychiatric Specialty Exam and Suicide Risk Assessment completed by Attending Physician  prior to discharge.  Discharge destination:  Home  Is patient on multiple antipsychotic therapies at discharge:  No   Has Patient had three or more failed trials of antipsychotic monotherapy by history:  No  Recommended Plan for Multiple Antipsychotic Therapies: NA  Discharge Instructions    Diet - low sodium heart healthy    Complete by:  As directed      Increase activity slowly    Complete by:  As directed             Medication List    STOP taking these medications        amoxicillin-clavulanate 500-125 MG tablet  Commonly known as:  AUGMENTIN     butalbital-acetaminophen-caffeine 50-325-40 MG tablet  Commonly known as:  FIORICET, ESGIC      TAKE these medications      Indication   acetaminophen 325 MG tablet  Commonly known as:  TYLENOL  Take 2 tablets (650 mg total) by mouth every 4 (four) hours as needed for fever, headache or mild pain.      albuterol (2.5 MG/3ML) 0.083% nebulizer solution  Commonly known as:  PROVENTIL  Take 2.5 mg by nebulization every 6 (six) hours as needed for wheezing or shortness of breath.      albuterol 108 (90 Base) MCG/ACT inhaler  Commonly known as:  PROVENTIL HFA;VENTOLIN HFA  Inhale 2 puffs into the lungs every 6 (six) hours as needed for wheezing or shortness of breath.   Indication:  Chronic Obstructive Lung Disease     b complex-vitamin c-folic acid 0.8 MG Tabs tablet  Take 1 tablet by mouth daily.      BIOTENE ORALBALANCE DRY MOUTH Liqd  Rinse your mouth every 6 hours.      bisacodyl 5 MG EC tablet  Commonly known as:  DULCOLAX  Take 1 tablet (5 mg total) by mouth daily as needed for moderate constipation.      cinacalcet 60 MG tablet  Commonly known as:  SENSIPAR  Take 60 mg by mouth daily. Takes with largest meal once daily      Darbepoetin Alfa 60 MCG/0.3ML Sosy injection  Commonly known as:  ARANESP  Inject 0.3 mLs (60 mcg total) into the vein every Thursday with hemodialysis.      divalproex 250 MG 24 hr  tablet  Commonly known as:  DEPAKOTE ER  Take 500 mg by mouth 2 (two) times daily.      doxepin 25 MG capsule  Commonly known as:  SINEQUAN  Take 25 mg by mouth  at bedtime.      DSS 100 MG Caps  Take 100 mg by mouth 2 (two) times daily.      feeding supplement (NEPRO CARB STEADY) Liqd  Take 237 mLs by mouth as needed (missed meal during dialysis.).      ferric gluconate 250 mg in sodium chloride 0.9 % 100 mL  Inject 250 mg into the vein every Tuesday, Thursday, Saturday, and Sunday.      LORazepam 0.5 MG tablet  Commonly known as:  ATIVAN  Take 0.5 mg by mouth every 8 (eight) hours as needed for anxiety or sleep.      metoprolol tartrate 25 MG tablet  Commonly known as:  LOPRESSOR  Take 1 tablet (25 mg total) by mouth 2 (two) times daily.   Indication:  High Blood Pressure     mometasone-formoterol 100-5 MCG/ACT Aero  Commonly known as:  DULERA  Inhale 2 puffs into the lungs 2 (two) times daily.   Indication:  Asthma     pantoprazole 40 MG tablet  Commonly known as:  PROTONIX  Take 1 tablet (40 mg total) by mouth daily.      sevelamer carbonate 800 MG tablet  Commonly known as:  RENVELA  Take 1,600 mg by mouth 3 (three) times daily with meals. Takes 2 tabs with each meal and snacks as directed      simvastatin 10 MG tablet  Commonly known as:  ZOCOR  Take 10 mg by mouth at bedtime. Reported on 05/30/2015      ziprasidone 60 MG capsule  Commonly known as:  GEODON  Take 1 capsule (60 mg total) by mouth 2 (two) times daily with a meal. For mood control   Indication:  Manic-Depression, mood stabilization / psychosis           Follow-up Information    Follow up with [facility]- Dr. Wonda Amis. Go on 06/15/2015.   Why:  For follow-up care appt on Monday 06/15/15 at 11:00am   Contact information:   [place], [place] Fax [phone]       Follow-up recommendations:  Activity:  As tolerated. Diet:  Renal. Other:  Keep follow-up  appointments.  Comments:    Signed: Savaughn Karwowski 06/08/2015, 12:47 PM

## 2015-06-08 NOTE — Progress Notes (Signed)
D:Pt in room upon approach. Pt is disoriented to situation and time. Noted to be less intrusive this evening in comparison to yesterday. Denies SI.  A:Encouragement and support provided. Q15 minute checks maintained for safety. Medications given as prescribed. R: Pt cooperative with staff. Paranoid at med pass but cooperative in taking medications. Remains safe on unit. Will continue to monitor.

## 2015-06-08 NOTE — Progress Notes (Signed)
  [facility] Adult Case Management Discharge Plan :  Will you be returning to the same living situation after discharge:  Yes,  home with daughter At discharge, do you have transportation home?: Yes,  daughter will pick up Do you have the ability to pay for your medications: Yes,  patient has Medicare   Release of information consent forms completed and in the chart;  Patient's signature needed at discharge.  Patient to Follow up at: Follow-up Information    Follow up with [facility]- Dr. Wonda Amis. Go on 06/15/2015.   Why:  For follow-up care appt on Monday 06/15/15 at 11:00am   Contact information:   [place], [place] Ph [phone] Fax [phone]       Next level of care provider has access to [place] and Suicide Prevention discussed: Yes,  SPE discussed with patient and Ranisha Rauseo (daughter) [phone]   Have you used any form of tobacco in the last 30 days? (Cigarettes, Smokeless Tobacco, Cigars, and/or Pipes): Yes  Has patient been referred to the Quitline?: Patient refused referral  Patient has been referred for addiction treatment: [place], [place], MSW, LCSWA [phone] 06/08/2015, 12:42 PM

## 2015-06-08 NOTE — Tx Team (Signed)
Interdisciplinary Treatment Plan Update (Adult)  Date:  06/08/2015 Time Reviewed:  11:09 AM  Progress in Treatment: Attending groups: No. Patient reports difficulty sitting through group and cannot sit on the chairs in the group room. Participating in groups:  No. Taking medication as prescribed:  Yes. Tolerating medication:  Yes. Family/Significant othe contact made:  Yes, individual(s) contacted:  patient's daughter Patient understands diagnosis:  As evidenced by:  patient sharing follow up information for her psychiatrist to arrange aftercare   Discussing patient identified problems/goals with staff:  Yes. Medical problems stabilized or resolved:  Yes. Denies suicidal/homicidal ideation: Yes. Issues/concerns per patient self-inventory:  Yes. Other:  New problem(s) identified: No, Describe:  none reported  Discharge Plan or Barriers: Patient will stabilize on medications and discharge home. Patient will need hospital follow up schedule for outpatient services at discharge.  Reason for Continuation of Hospitalization: Anxiety Medication stabilization Other; describe paranoia and bizarre behavior  Comments:   Estimated length of stay: up to 0 days will discharge today Monday 06/08/15  New goal(s):  Review of initial/current patient goals per problem list:   1.  Goal(s):participate in aftercare plan  Met:  Yes  Target date:by discharge  As evidenced AR:EQJEADG will participate in aftercare plan AEB aftercare provider and housing plan identified at discharge 06/04/15: patient will discharge home and will follow up with Dr. Wonda Amis at [facility] in [place] at discharge  2.  Goal (s):decrease psychosis  Met:  Yes  Target date:by discharge  As evidenced NP:[id number] decreased signs of pschosis 06/08/15: patient no longer displaying paranoia and does not report any psychosis and is safe for discharge per MD  Attendees: Physician:  Orson Slick, MD  1/25/201710:20 AM  Nursing:   Karin Lieu, RN 1/25/201710:20 AM  Other:  Carmell Austria, [place] 1/25/201710:20 AM  Other:   1/25/201710:20 AM  Other:   1/25/201710:20 AM  Other:  1/25/201710:20 AM  Other:  1/25/201710:20 AM  Other:  1/25/201710:20 AM  Other:  1/25/201710:20 AM  Other:  1/25/201710:20 AM  Other:  1/25/201710:20 AM  Other:   1/25/201710:20 AM    Scribe for Treatment Team:   Keene Breath, MSW, LCSWA [phone]  06/08/2015, 11:09 AM

## 2015-06-10 ENCOUNTER — Encounter (HOSPITAL_COMMUNITY): Payer: Self-pay | Admitting: Emergency Medicine

## 2015-06-10 ENCOUNTER — Non-Acute Institutional Stay (HOSPITAL_COMMUNITY)
Admission: EM | Admit: 2015-06-10 | Discharge: 2015-06-12 | Disposition: A | Discharge status: Home / Self Care | Payer: Medicare Other | Attending: Emergency Medicine | Admitting: Emergency Medicine

## 2015-06-10 ENCOUNTER — Emergency Department (HOSPITAL_COMMUNITY): Payer: Medicare Other

## 2015-06-10 DIAGNOSIS — Z7951 Long term (current) use of inhaled steroids: Secondary | ICD-10-CM | POA: Insufficient documentation

## 2015-06-10 DIAGNOSIS — Z79899 Other long term (current) drug therapy: Secondary | ICD-10-CM | POA: Diagnosis not present

## 2015-06-10 DIAGNOSIS — F312 Bipolar disorder, current episode manic severe with psychotic features: Secondary | ICD-10-CM | POA: Insufficient documentation

## 2015-06-10 DIAGNOSIS — J45909 Unspecified asthma, uncomplicated: Secondary | ICD-10-CM | POA: Diagnosis not present

## 2015-06-10 DIAGNOSIS — Z96651 Presence of right artificial knee joint: Secondary | ICD-10-CM | POA: Insufficient documentation

## 2015-06-10 DIAGNOSIS — Z9115 Patient's noncompliance with renal dialysis: Secondary | ICD-10-CM | POA: Diagnosis not present

## 2015-06-10 DIAGNOSIS — E1122 Type 2 diabetes mellitus with diabetic chronic kidney disease: Secondary | ICD-10-CM | POA: Diagnosis not present

## 2015-06-10 DIAGNOSIS — I120 Hypertensive chronic kidney disease with stage 5 chronic kidney disease or end stage renal disease: Secondary | ICD-10-CM | POA: Insufficient documentation

## 2015-06-10 DIAGNOSIS — R569 Unspecified convulsions: Secondary | ICD-10-CM | POA: Insufficient documentation

## 2015-06-10 DIAGNOSIS — K219 Gastro-esophageal reflux disease without esophagitis: Secondary | ICD-10-CM | POA: Insufficient documentation

## 2015-06-10 DIAGNOSIS — F4310 Post-traumatic stress disorder, unspecified: Secondary | ICD-10-CM | POA: Insufficient documentation

## 2015-06-10 DIAGNOSIS — J449 Chronic obstructive pulmonary disease, unspecified: Secondary | ICD-10-CM | POA: Insufficient documentation

## 2015-06-10 DIAGNOSIS — F17210 Nicotine dependence, cigarettes, uncomplicated: Secondary | ICD-10-CM | POA: Diagnosis not present

## 2015-06-10 DIAGNOSIS — Z8659 Personal history of other mental and behavioral disorders: Secondary | ICD-10-CM | POA: Diagnosis not present

## 2015-06-10 DIAGNOSIS — F05 Delirium due to known physiological condition: Secondary | ICD-10-CM | POA: Diagnosis not present

## 2015-06-10 DIAGNOSIS — R4182 Altered mental status, unspecified: Secondary | ICD-10-CM | POA: Diagnosis present

## 2015-06-10 DIAGNOSIS — N186 End stage renal disease: Secondary | ICD-10-CM | POA: Insufficient documentation

## 2015-06-10 DIAGNOSIS — Z992 Dependence on renal dialysis: Secondary | ICD-10-CM | POA: Diagnosis not present

## 2015-06-10 DIAGNOSIS — E875 Hyperkalemia: Secondary | ICD-10-CM | POA: Diagnosis not present

## 2015-06-10 LAB — COMPREHENSIVE METABOLIC PANEL
ALT: 12 U/L — ABNORMAL LOW (ref 14–54)
AST: 19 U/L (ref 15–41)
Albumin: 3.1 g/dL — ABNORMAL LOW (ref 3.5–5.0)
Alkaline Phosphatase: 134 U/L — ABNORMAL HIGH (ref 38–126)
Anion gap: 14 (ref 5–15)
BUN: 66 mg/dL — ABNORMAL HIGH (ref 6–20)
CO2: 21 mmol/L — ABNORMAL LOW (ref 22–32)
Calcium: 9.8 mg/dL (ref 8.9–10.3)
Chloride: 107 mmol/L (ref 101–111)
Creatinine, Ser: 5.51 mg/dL — ABNORMAL HIGH (ref 0.44–1.00)
GFR calc Af Amer: 9 mL/min — ABNORMAL LOW (ref >60)
GFR calc non Af Amer: 8 mL/min — ABNORMAL LOW (ref >60)
Glucose, Bld: 60 mg/dL — ABNORMAL LOW (ref 65–99)
Potassium: 6.4 mmol/L (ref 3.5–5.1)
Sodium: 142 mmol/L (ref 135–145)
Total Bilirubin: 0.6 mg/dL (ref 0.3–1.2)
Total Protein: 6.8 g/dL (ref 6.5–8.1)

## 2015-06-10 LAB — CBC WITH DIFFERENTIAL/PLATELET
Basophils Absolute: 0.1 10*3/uL (ref 0.0–0.1)
Basophils Relative: 1 %
Eosinophils Absolute: 0.1 10*3/uL (ref 0.0–0.7)
Eosinophils Relative: 1 %
HCT: 35.7 % — ABNORMAL LOW (ref 36.0–46.0)
Hemoglobin: 10.9 g/dL — ABNORMAL LOW (ref 12.0–15.0)
Lymphocytes Relative: 28 %
Lymphs Abs: 1.4 10*3/uL (ref 0.7–4.0)
MCH: 30.9 pg (ref 26.0–34.0)
MCHC: 30.5 g/dL (ref 30.0–36.0)
MCV: 101.1 fL — ABNORMAL HIGH (ref 78.0–100.0)
Monocytes Absolute: 0.4 10*3/uL (ref 0.1–1.0)
Monocytes Relative: 8 %
Neutro Abs: 3.1 10*3/uL (ref 1.7–7.7)
Neutrophils Relative %: 61 %
Platelets: 160 10*3/uL (ref 150–400)
RBC: 3.53 MIL/uL — ABNORMAL LOW (ref 3.87–5.11)
RDW: 18.7 % — ABNORMAL HIGH (ref 11.5–15.5)
WBC: 5 10*3/uL (ref 4.0–10.5)

## 2015-06-10 LAB — RAPID URINE DRUG SCREEN, HOSP PERFORMED
Amphetamines: NOT DETECTED
Barbiturates: NOT DETECTED
Benzodiazepines: NOT DETECTED
Cocaine: NOT DETECTED
Opiates: NOT DETECTED
Tetrahydrocannabinol: NOT DETECTED

## 2015-06-10 LAB — URINALYSIS, ROUTINE W REFLEX MICROSCOPIC
Bilirubin Urine: NEGATIVE
Glucose, UA: NEGATIVE mg/dL
Ketones, ur: 15 mg/dL — AB
Leukocytes, UA: NEGATIVE
Nitrite: NEGATIVE
Protein, ur: >300 mg/dL — AB
Specific Gravity, Urine: 1.009 (ref 1.005–1.030)
pH: 7.5 (ref 5.0–8.0)

## 2015-06-10 LAB — URINE MICROSCOPIC-ADD ON

## 2015-06-10 LAB — AMMONIA: Ammonia: 19 umol/L (ref 9–35)

## 2015-06-10 LAB — VALPROIC ACID LEVEL: Valproic Acid Lvl: 42 ug/mL — ABNORMAL LOW (ref 50.0–100.0)

## 2015-06-10 LAB — ETHANOL: Alcohol, Ethyl (B): <5 mg/dL (ref <5)

## 2015-06-10 MED ORDER — ALBUTEROL SULFATE (2.5 MG/3ML) 0.083% IN NEBU: 2.5000 mg | INHALATION_SOLUTION | Freq: Four times a day (QID) | RESPIRATORY_TRACT | Status: DC | PRN

## 2015-06-10 MED ORDER — SEVELAMER CARBONATE 800 MG PO TABS
1600.0000 mg | ORAL_TABLET | Freq: Three times a day (TID) | ORAL | Status: DC
  Administered 2015-06-11 (×2): 1600 mg via ORAL
  Filled 2015-06-10 (×5): qty 2

## 2015-06-10 MED ORDER — HYDROCORTISONE 2.5 % RE CREA
TOPICAL_CREAM | Freq: Two times a day (BID) | RECTAL | Status: DC
  Filled 2015-06-10: qty 28.35

## 2015-06-10 MED ORDER — NICOTINE 21 MG/24HR TD PT24
21.0000 mg | MEDICATED_PATCH | Freq: Once | TRANSDERMAL | Status: AC
  Administered 2015-06-10 – 2015-06-11 (×2): 21 mg via TRANSDERMAL
  Filled 2015-06-10 (×2): qty 1

## 2015-06-10 MED ORDER — ZOLPIDEM TARTRATE 5 MG PO TABS
5.0000 mg | ORAL_TABLET | Freq: Every evening | ORAL | Status: DC | PRN
  Administered 2015-06-11: 5 mg via ORAL
  Filled 2015-06-10: qty 1

## 2015-06-10 MED ORDER — DIVALPROEX SODIUM ER 500 MG PO TB24
500.0000 mg | ORAL_TABLET | Freq: Every morning | ORAL | Status: DC
  Administered 2015-06-11: 500 mg via ORAL
  Filled 2015-06-10: qty 1

## 2015-06-10 MED ORDER — ACETAMINOPHEN 325 MG PO TABS
650.0000 mg | ORAL_TABLET | ORAL | Status: DC | PRN
  Administered 2015-06-11 (×3): 650 mg via ORAL
  Filled 2015-06-10 (×4): qty 2

## 2015-06-10 MED ORDER — DOCUSATE SODIUM 100 MG PO CAPS
100.0000 mg | ORAL_CAPSULE | Freq: Two times a day (BID) | ORAL | Status: DC
  Administered 2015-06-11 (×2): 100 mg via ORAL
  Filled 2015-06-10 (×3): qty 1

## 2015-06-10 MED ORDER — BISACODYL 5 MG PO TBEC: 5.0000 mg | DELAYED_RELEASE_TABLET | Freq: Every day | ORAL | Status: DC | PRN

## 2015-06-10 MED ORDER — SIMVASTATIN 10 MG PO TABS
10.0000 mg | ORAL_TABLET | Freq: Every day | ORAL | Status: DC
  Administered 2015-06-11: 10 mg via ORAL
  Filled 2015-06-10 (×4): qty 1

## 2015-06-10 MED ORDER — DOXEPIN HCL 25 MG PO CAPS
25.0000 mg | ORAL_CAPSULE | Freq: Every day | ORAL | Status: DC
  Administered 2015-06-11: 25 mg via ORAL
  Filled 2015-06-10 (×4): qty 1

## 2015-06-10 MED ORDER — ALBUTEROL SULFATE HFA 108 (90 BASE) MCG/ACT IN AERS: 2.0000 | INHALATION_SPRAY | Freq: Four times a day (QID) | RESPIRATORY_TRACT | Status: DC | PRN

## 2015-06-10 MED ORDER — ONDANSETRON HCL 4 MG PO TABS: 4.0000 mg | ORAL_TABLET | Freq: Three times a day (TID) | ORAL | Status: DC | PRN

## 2015-06-10 MED ORDER — DIVALPROEX SODIUM ER 500 MG PO TB24
1000.0000 mg | ORAL_TABLET | Freq: Every day | ORAL | Status: DC
  Administered 2015-06-10 – 2015-06-11 (×2): 1000 mg via ORAL
  Filled 2015-06-10 (×2): qty 2

## 2015-06-10 MED ORDER — MOMETASONE FURO-FORMOTEROL FUM 100-5 MCG/ACT IN AERO
2.0000 | INHALATION_SPRAY | Freq: Two times a day (BID) | RESPIRATORY_TRACT | Status: DC
  Administered 2015-06-11 (×2): 2 via RESPIRATORY_TRACT
  Filled 2015-06-10 (×3): qty 8.8

## 2015-06-10 MED ORDER — LORAZEPAM 1 MG PO TABS
1.0000 mg | ORAL_TABLET | Freq: Three times a day (TID) | ORAL | Status: DC | PRN
  Administered 2015-06-10 – 2015-06-12 (×3): 1 mg via ORAL
  Filled 2015-06-10 (×3): qty 1

## 2015-06-10 MED ORDER — IBUPROFEN 400 MG PO TABS: 600.0000 mg | ORAL_TABLET | Freq: Three times a day (TID) | ORAL | Status: DC | PRN

## 2015-06-10 MED ORDER — METOPROLOL TARTRATE 25 MG PO TABS
25.0000 mg | ORAL_TABLET | Freq: Two times a day (BID) | ORAL | Status: DC
  Administered 2015-06-10 – 2015-06-11 (×3): 25 mg via ORAL
  Filled 2015-06-10 (×3): qty 1

## 2015-06-10 MED ORDER — SODIUM POLYSTYRENE SULFONATE 15 GM/60ML PO SUSP
50.0000 g | Freq: Once | ORAL | Status: AC
  Administered 2015-06-10: 50 g via ORAL
  Filled 2015-06-10: qty 240

## 2015-06-10 MED ORDER — ZIPRASIDONE HCL 20 MG PO CAPS
60.0000 mg | ORAL_CAPSULE | Freq: Two times a day (BID) | ORAL | Status: DC
  Administered 2015-06-11 – 2015-06-12 (×3): 60 mg via ORAL
  Filled 2015-06-10 (×4): qty 3

## 2015-06-10 MED ORDER — RENA-VITE PO TABS
1.0000 | ORAL_TABLET | Freq: Every day | ORAL | Status: DC
  Administered 2015-06-11: 1 via ORAL
  Filled 2015-06-10 (×4): qty 1

## 2015-06-10 MED ORDER — CINACALCET HCL 30 MG PO TABS
60.0000 mg | ORAL_TABLET | Freq: Every day | ORAL | Status: DC
  Administered 2015-06-11: 60 mg via ORAL
  Filled 2015-06-10 (×4): qty 2

## 2015-06-10 MED ORDER — ALUM & MAG HYDROXIDE-SIMETH 200-200-20 MG/5ML PO SUSP: 30.0000 mL | ORAL | Status: DC | PRN

## 2015-06-10 MED ORDER — PANTOPRAZOLE SODIUM 40 MG PO TBEC
40.0000 mg | DELAYED_RELEASE_TABLET | Freq: Every day | ORAL | Status: DC
  Administered 2015-06-11: 40 mg via ORAL
  Filled 2015-06-10: qty 1

## 2015-06-10 NOTE — ED Notes (Signed)
Called staffing for sitter 

## 2015-06-10 NOTE — ED Notes (Signed)
Patient currently restless, with multiple trips to the restroom with no output.

## 2015-06-10 NOTE — ED Notes (Signed)
Patient is too argumentative in regards to receiving medications due.

## 2015-06-10 NOTE — ED Notes (Signed)
Pt missed dialysis treatment this week. She missed Tuesday treatment with Saturday being last treatment. Pt lives at home with dtr. Pt irrational, worried, having repetitive conversations. Has hx of this and noncompliance with dialysis. Pt trying to leave and get out of bed. BP 180/109, HR 100, CBG 75. Pt denies SI/HI.

## 2015-06-10 NOTE — ED Notes (Signed)
Gave PA/MD IVC papers for pt.

## 2015-06-10 NOTE — ED Notes (Signed)
Pt is in maroon scrubs and has been wanded. Pt's belongings at nurse's station.

## 2015-06-10 NOTE — ED Provider Notes (Signed)
CSN: [id number]     Arrival date & time 06/10/15  1514 History   First MD Initiated Contact with Patient 06/10/15 1514     Chief Complaint  Patient presents with  . Altered Mental Status    needs dialysis     (Consider location/radiation/quality/duration/timing/severity/associated sxs/prior Treatment) Patient is a 55 y.o. female presenting with altered mental status. The history is provided by medical records and the EMS personnel. No language interpreter was used.  Altered Mental Status  Jennifer Lawson is a 55 y.o. female  with a significant psych PMH including bipolar, ESRD on dialysis who presents to the Emergency Department via EMS. EMS called by daughter. Patient history is from ancillary staff via EMS. Pt. Has dialysis T, TH, Sat however missed last dialysis, therefore has not received treatment since Saturday. No chest pain. Patient initially unsure of why she was ED asking EMS personnel "why did you bring me here?" and attempting to exit the room. Initially denied any complaints, but later informed me she was "bleeding rectally" and has hx of hemorrhoids. Denies SI/HI. Daughter called for additional history - states that patient has been getting her medications as prescribed, however she has not slept much at all over the last two days. Pacing up and down hallways day and night. Daughter confirms hx of hemorrhoids present for some time now with occasional bleeding.   Level 5 caveat due to altered mental status Past Medical History  Diagnosis Date  . Mental disorder   . Depression   . Hypertension   . Overdose   . Tobacco use disorder 11/13/2012  . Complication of anesthesia     difficulty going to sleep  . Chronic kidney disease     06/11/13- not on dialysis  . Shortness of breath     lying down flat  . PTSD (post-traumatic stress disorder)   . Asthma   . COPD (chronic obstructive pulmonary disease) ([place])   . Heart murmur   . GERD (gastroesophageal reflux disease)   .  Seizures ([place])     "passsed out"  . History of blood transfusion   . Diabetes mellitus without complication [facility])     denies   Past Surgical History  Procedure Laterality Date  . Right knee replacement      she says it was last year.  . Esophagogastroduodenoscopy Left 11/14/2012    Procedure: ESOPHAGOGASTRODUODENOSCOPY (EGD);  Surgeon: [name], MD;  Location: [facility];  Service: Endoscopy;  Laterality: Left;  . Joint replacement Right     knee  . Parathyroidectomy    . Av fistula placement Right 06/12/2013    Procedure: ARTERIOVENOUS (AV) FISTULA CREATION; RIGHT  BASILIC VEIN TRANSPOSITION with Intraoperative ultrasound;  Surgeon: Mal [name], MD;  Location: [facility];  Service: Vascular;  Laterality: Right;   Family History  Problem Relation Age of Onset  . Diabetes Mother   . Hyperlipidemia Mother   . Hypertension Mother   . Diabetes Father   . Hypertension Father   . Hyperlipidemia Father    Social History  Substance Use Topics  . Smoking status: Current Every Day Smoker -- 2.00 packs/day for 40 years    Types: Cigarettes, Cigars  . Smokeless tobacco: Never Used  . Alcohol Use: No     Comment: none in over a month per daughter   OB History    No data available     Review of Systems  Unable to perform ROS: Mental status change  Respiratory: Negative for shortness  of breath.   Cardiovascular: Negative for chest pain.  Gastrointestinal: Positive for blood in stool.  Psychiatric/Behavioral: Negative for suicidal ideas.      Allergies  Codeine sulfate; Gabapentin; Haldol; Risperidone and related; Trazodone and nefazodone; Depakote; Haloperidol; Invega; Risperidone; Vistaril; and Hydrocodone-acetaminophen  Home Medications   Prior to Admission medications   Medication Sig Start Date End Date Taking? Authorizing Provider  albuterol (PROVENTIL HFA;VENTOLIN HFA) 108 (90 Base) MCG/ACT inhaler Inhale 2 puffs into the lungs every 6 (six) hours as needed for wheezing  or shortness of breath. 06/08/15   Clovis Fredrickson, MD  albuterol (PROVENTIL) (2.5 MG/3ML) 0.083% nebulizer solution Take 2.5 mg by nebulization every 6 (six) hours as needed for wheezing or shortness of breath.    Historical Provider, MD  b complex-vitamin c-folic acid (NEPHRO-VITE) 0.8 MG TABS tablet Take 1 tablet by mouth daily.    Historical Provider, MD  bisacodyl (DULCOLAX) 5 MG EC tablet Take 1 tablet (5 mg total) by mouth daily as needed for moderate constipation. 06/08/15   Clovis Fredrickson, MD  cinacalcet (SENSIPAR) 60 MG tablet Take 60 mg by mouth daily. Takes with largest meal once daily    Historical Provider, MD  divalproex (DEPAKOTE ER) 500 MG 24 hr tablet Take 2 tablets (1,000 mg total) by mouth at bedtime. 06/08/15   Clovis Fredrickson, MD  divalproex (DEPAKOTE ER) 500 MG 24 hr tablet Take 1 tablet (500 mg total) by mouth every morning. 06/09/15   Clovis Fredrickson, MD  Docusate Sodium (DSS) 100 MG CAPS Take 100 mg by mouth 2 (two) times daily. 06/08/15   Clovis Fredrickson, MD  doxepin (SINEQUAN) 25 MG capsule Take 1 capsule (25 mg total) by mouth at bedtime. 06/08/15   Clovis Fredrickson, MD  hydrocortisone (ANUSOL-HC) 2.5 % rectal cream Place rectally 2 (two) times daily. 06/08/15   Clovis Fredrickson, MD  LORazepam (ATIVAN) 0.5 MG tablet Take 0.5 mg by mouth every 8 (eight) hours as needed for anxiety or sleep.     Historical Provider, MD  metoprolol tartrate (LOPRESSOR) 25 MG tablet Take 1 tablet (25 mg total) by mouth 2 (two) times daily. 06/08/15   Clovis Fredrickson, MD  mometasone-formoterol (DULERA) 100-5 MCG/ACT AERO Inhale 2 puffs into the lungs 2 (two) times daily. 06/08/15   Clovis Fredrickson, MD  pantoprazole (PROTONIX) 40 MG tablet Take 1 tablet (40 mg total) by mouth daily. 06/08/15   Clovis Fredrickson, MD  sevelamer carbonate (RENVELA) 800 MG tablet Take 1,600 mg by mouth 3 (three) times daily with meals. Takes 2 tabs with each meal and snacks as  directed    Historical Provider, MD  simvastatin (ZOCOR) 10 MG tablet Take 1 tablet (10 mg total) by mouth at bedtime. Reported on 05/30/2015 06/08/15   Clovis Fredrickson, MD  ziprasidone (GEODON) 60 MG capsule Take 1 capsule (60 mg total) by mouth 2 (two) times daily with a meal. For mood control 06/08/15   Jolanta B Pucilowska, MD   BP 163/115 mmHg  Temp(Src) 99 F (37.2 C) (Oral)  Resp 16  SpO2 97%  LMP 05/09/2008 Physical Exam  Constitutional: She appears well-developed and well-nourished.  Hyperalert, NAD.   HENT:  Head: Normocephalic and atraumatic.  Eyes: EOM are normal. Pupils are equal, round, and reactive to light.  Cardiovascular: Normal rate and regular rhythm.   2/6 SEM  Pulmonary/Chest: Effort normal and breath sounds normal. No respiratory distress.  Abdominal: Soft. Bowel sounds are normal. She  exhibits no distension. There is no tenderness.  Musculoskeletal:  Moves all extremities well  Neurological:  Alert to self and time.  Skin: Skin is warm and dry. No rash noted.  Psychiatric:  Pt. With repetitive speech, worried. Unsure of what brought her into ED.   Nursing note and vitals reviewed.   ED Course  Procedures (including critical care time) Labs Review Labs Reviewed  CBC WITH DIFFERENTIAL/PLATELET  AMMONIA  COMPREHENSIVE METABOLIC PANEL  URINE RAPID DRUG SCREEN, HOSP PERFORMED  ETHANOL  URINALYSIS, ROUTINE W REFLEX MICROSCOPIC (NOT AT [facility])  VALPROIC ACID LEVEL    Imaging Review No results found. I have personally reviewed and evaluated these images and lab results as part of my medical decision-making.   EKG Interpretation None      MDM   Final diagnoses:  Altered mental status   Jennifer Lawson presents to ED via EMS for change in mental status, missing dialysis. Per daughter called on phone, patient has been hyperalert, not sleeping, and pacing the hallways day and night x 2-3 days. Hx of bipolar disorder, recently discharged from  [facility]. When daughter realized she had missed dialysis, she called EMS to bring her to hospital.   K+ of 6.4, EKG reviewed with no significant changes. CXR with cardiomegaly and pulmonary venous HTN - no frank pulmonary edema.   Consults: Nephrology who recommends 50mg  Kayexalate and dialysis tomorrow. If patient is still at [facility] tomorrow, please let nephrology know for dialysis here.   A&P: Bipolar disorder, manic episode - TTS consulted who will keep patient overnight for further observation. Pt. Transferred to [place] work consulted  Hyperkalemia: Kayexalate given in ED, dialysis tomorrow.   Patient discussed with Dr. Roxanne Mins who agrees with treatment plan.   [facility] Charmon Thorson, PA-C [id number]  Delora Fuel, MD [phone]

## 2015-06-10 NOTE — ED Notes (Signed)
Pt repeatedly asking to leave, not comprehending why she is here. Pt repeatedly asking questions. Pt is unable to be reasoned with. Pt trying to get out of bed. PA aware. NT at bedside for safety.

## 2015-06-10 NOTE — ED Notes (Signed)
Pt speaking with TTS counselor with pt's dtr present

## 2015-06-10 NOTE — Progress Notes (Signed)
CSW c/s noted. CSW re: daughter/stressors re: caring for pt.  Evening CSW has asked that dayshift CSW f/u with daughter in am.  Pt for psych re-eval in am, pt is IVC'ed.

## 2015-06-10 NOTE — ED Notes (Signed)
Faxed IVC paperwork to [facility]

## 2015-06-10 NOTE — ED Notes (Signed)
Pt states she has had a rectal bleed. Pt also states she has hemorrhoids

## 2015-06-10 NOTE — ED Notes (Signed)
Pt repetitively asking "why did yall bring me here?" "where am I?"

## 2015-06-10 NOTE — ED Notes (Signed)
Gave pt [place] sandwich and sprite zero. Informed pt repeatedly why she is here and tried to reason with pt. Pt is inconsolable and irrational. Pt unable to reason. NT at bedside.

## 2015-06-10 NOTE — ED Notes (Signed)
Pt refused xray. Pt back in room. RN spoke with pt's dtr- pt normally able to function WNL and take care of herself. Pt having manic episode. Pt's dtr states pt not having abnormal rectal bleeding. Pt has known hemorrhoids.

## 2015-06-10 NOTE — BH Assessment (Signed)
Tele Assessment Note   Jennifer Lawson is an 55 y.o. female who presents voluntarily with her daughter, Jennifer Lawson, to [facility]. Pt was irritable and initially refused to speak to counselor. Pt's daughter entered the room and was able to give basic information concerning pt's reason for ED admission. Jennifer Lawson indicated that she called 911 to bring her mother to the hospital b/c she was refusing to go to her dialysis appt. Jennifer Lawson reported that, upon her release from [facility] IP psychiatric trmt on 06/08/15, pt has been med compliant. Jennifer Lawson indicated that she did not believe that pt needed another IP psych, but she just wanted pt to get her dialysis. Pt minimally participated in the assessment, mainly responding to her daughter's statements concerning her refusal to go to dialysis. Pt's responses were relevant and she did not appear to be responding to internal stimuli. Jennifer Lawson also shared that, upon release from [facility], pt has not been to sleep at all. Jennifer Lawson additionally expressed dissatisfaction with pt's current psychiatric provider, Dr. Rosine Door. Lastly, Jennifer Lawson discussed concerns about being able to continue to care for her mother, as pt cycles thru episodes of refusing to f/u with her psychiatric and dialysis treatment, in addition to other things and starts to decompensate.   Diagnosis: F31.9 Bipolar I disorder, Current or most recent episode hypomanic, Unspecified  Past Medical History:  Past Medical History  Diagnosis Date  . Mental disorder   . Depression   . Hypertension   . Overdose   . Tobacco use disorder 11/13/2012  . Complication of anesthesia     difficulty going to sleep  . Chronic kidney disease     06/11/13- not on dialysis  . Shortness of breath     lying down flat  . PTSD (post-traumatic stress disorder)   . Asthma   . COPD (chronic obstructive pulmonary disease) ([place])   . Heart murmur   . GERD (gastroesophageal reflux disease)   . Seizures ([place])     "passsed out"   . History of blood transfusion   . Diabetes mellitus without complication [facility])     denies    Past Surgical History  Procedure Laterality Date  . Right knee replacement      she says it was last year.  . Esophagogastroduodenoscopy Left 11/14/2012    Procedure: ESOPHAGOGASTRODUODENOSCOPY (EGD);  Surgeon: [name], MD;  [facility];  Service: Endoscopy;  Laterality: Left;  . Joint replacement Right     knee  . Parathyroidectomy    . Av fistula placement Right 06/12/2013    Procedure: ARTERIOVENOUS (AV) FISTULA CREATION; RIGHT  BASILIC VEIN TRANSPOSITION with Intraoperative ultrasound;  Surgeon: Mal [name], MD;  Location: [facility] OR;  Service: Vascular;  Laterality: Right;    Family History:  Family History  Problem Relation Age of Onset  . Diabetes Mother   . Hyperlipidemia Mother   . Hypertension Mother   . Diabetes Father   . Hypertension Father   . Hyperlipidemia Father     Social History:  reports that she has been smoking Cigarettes and Cigars.  She has a 80 pack-year smoking history. She has never used smokeless tobacco. She reports that she does not drink alcohol or use illicit drugs.  Additional Social History:  Alcohol / Drug Use Pain Medications: see PTA meds Prescriptions: see PTA meds Over the Counter: see PTA meds History of alcohol / drug use?: No history of alcohol / drug abuse  CIWA: CIWA-Ar BP: (!) 161/109 mmHg Pulse Rate: 102 COWS:  PATIENT STRENGTHS: (choose at least two) Communication skills Supportive family/friends  Allergies:  Allergies  Allergen Reactions  . Codeine Sulfate Anaphylaxis    Daughter called about having this allergy   . Gabapentin Other (See Comments) and Anaphylaxis    seizures  . Haldol [Haloperidol Lactate] Shortness Of Breath  . Risperidone And Related Shortness Of Breath  . Trazodone And Nefazodone Other (See Comments)    Makes pt lose balance and fall  . Depakote [Divalproex Sodium] Other (See Comments)     Nose bleeds-currently taking medication  . Haloperidol Other (See Comments)    hallucinations  . Invega [Paliperidone Er] Nausea And Vomiting  . Risperidone Other (See Comments)    Hallucinations?  Marland Kitchen Vistaril [Hydroxyzine Hcl] Nausea And Vomiting  . Hydrocodone-Acetaminophen Itching    Home Medications:  (Not in a hospital admission)  OB/GYN Status:  Patient's last menstrual period was 05/09/2008.  General Assessment Data Location of Assessment: [facility] ED TTS Assessment: In system Is this a Tele or Face-to-Face Assessment?: Tele Assessment Is this an Initial Assessment or a Re-assessment for this encounter?: Initial Assessment Marital status: Divorced Is patient pregnant?: No Pregnancy Status: No Living Arrangements: Children Can pt return to current living arrangement?: Yes Admission Status: Voluntary Is patient capable of signing voluntary admission?: Yes Referral Source: Self/Family/Friend Insurance type: [facility] Medicare  Medical Screening Exam ([place]) Medical Exam completed: Yes  Crisis Care Plan Living Arrangements: Children Name of Psychiatrist: Dr. Saundra Shelling [facility]  Name of Therapist: None  Education Status Is patient currently in school?: No Highest grade of school patient has completed: High School  Risk to self with the past 6 months Suicidal Ideation: No Has patient been a risk to self within the past 6 months prior to admission? : No Suicidal Intent: No Has patient had any suicidal intent within the past 6 months prior to admission? : No Is patient at risk for suicide?: No Suicidal Plan?: No Has patient had any suicidal plan within the past 6 months prior to admission? : No Access to Means: No What has been your use of drugs/alcohol within the last 12 months?: pt denies Previous Attempts/Gestures: Yes How many times?: 1 Other Self Harm Risks: refusing to go for dialysis Triggers for Past Attempts: Unpredictable Intentional Self  Injurious Behavior: None Family Suicide History: Unknown Persecutory voices/beliefs?: No Depression: No Depression Symptoms: Insomnia Substance abuse history and/or treatment for substance abuse?: No Suicide prevention information given to non-admitted patients: Not applicable  Risk to Others within the past 6 months Homicidal Ideation: No Does patient have any lifetime risk of violence toward others beyond the six months prior to admission? : No Thoughts of Harm to Others: No Current Homicidal Intent: No Current Homicidal Plan: No Access to Homicidal Means: No History of harm to others?: No Assessment of Violence: None Noted Violent Behavior Description: pt generally calm for assessment Does patient have access to weapons?: No Criminal Charges Pending?: No Does patient have a court date: No Is patient on probation?: No  Psychosis Hallucinations: None noted Delusions: None noted  Mental Status Report Appearance/Hygiene: Unremarkable Eye Contact: Fair Motor Activity: Restlessness Speech: Slow Level of Consciousness: Irritable, Alert Mood: Irritable Affect: Anxious, Irritable Anxiety Level: Moderate Thought Processes: Unable to Assess Judgement: Unable to Assess Orientation: Unable to assess Obsessive Compulsive Thoughts/Behaviors: Unable to Assess  Cognitive Functioning Concentration: Unable to Assess Memory: Unable to Assess IQ: Average Insight: Unable to Assess Impulse Control: Unable to Assess Appetite: Fair Sleep: Decreased Total  Hours of Sleep: 0 Vegetative Symptoms: None  ADLScreening [facility] Assessment Services) Patient's cognitive ability adequate to safely complete daily activities?: Yes Patient able to express need for assistance with ADLs?: Yes  Prior Inpatient Therapy Prior Inpatient Therapy: Yes Prior Therapy Dates:  (2016; 2017) Prior Therapy Facilty/Provider(s):  [facility]; [facility]) Reason for Treatment: Psychosis   Prior Outpatient Therapy Prior  Outpatient Therapy: Yes Prior Therapy Dates: ongoing Prior Therapy Facilty/Provider(s): [facility]  Reason for Treatment: Bipolar disorder Does patient have an ACCT team?: No Does patient have Intensive In-House Services?  : No Does patient have [facility] services? : No Does patient have P4CC services?: No  ADL Screening (condition at time of admission) Patient's cognitive ability adequate to safely complete daily activities?: Yes Is the patient deaf or have difficulty hearing?: No Does the patient have difficulty seeing, even when wearing glasses/contacts?: No Does the patient have difficulty concentrating, remembering, or making decisions?: No Patient able to express need for assistance with ADLs?: Yes Does the patient have difficulty dressing or bathing?: No Does the patient have difficulty walking or climbing stairs?: No Weakness of Legs: None Weakness of Arms/Hands: None  Home Assistive Devices/Equipment Home Assistive Devices/Equipment: None  Therapy Consults (therapy consults require a physician order) PT Evaluation Needed: No OT Evalulation Needed: No SLP Evaluation Needed: No Abuse/Neglect Assessment (Assessment to be complete while patient is alone) Physical Abuse:  (UTA) Verbal Abuse: Denies Sexual Abuse: Denies Exploitation of patient/patient's resources: Denies Self-Neglect: Denies Values / Beliefs Cultural Requests During Hospitalization: None Spiritual Requests During Hospitalization: None Consults Spiritual Care Consult Needed: No Social Work Consult Needed: Yes (Comment) Administrator, Civil Service, PA-C put in consult) Advance Directives (For Healthcare) Does patient have an advance directive?: No Would patient like information on creating an advanced directive?: No - patient declined information    Additional Information 1:1 In Past 12 Months?: No CIRT Risk: No Elopement Risk: Yes Does patient have medical clearance?: Yes     Disposition:   Disposition Initial Assessment Completed for this Encounter: Yes Disposition of Patient: Other dispositions (per Dr. Adele Schilder) Other disposition(s): Other (Comment) (observe overnight and re-evaluate in AM)  Rexene Edison 06/10/2015 7:51 PM

## 2015-06-10 NOTE — ED Notes (Signed)
Sitter present on arrival to room

## 2015-06-11 DIAGNOSIS — F05 Delirium due to known physiological condition: Secondary | ICD-10-CM | POA: Diagnosis not present

## 2015-06-11 DIAGNOSIS — Z8659 Personal history of other mental and behavioral disorders: Secondary | ICD-10-CM | POA: Diagnosis not present

## 2015-06-11 DIAGNOSIS — F312 Bipolar disorder, current episode manic severe with psychotic features: Secondary | ICD-10-CM | POA: Diagnosis not present

## 2015-06-11 LAB — POTASSIUM: Potassium: 4.3 mmol/L (ref 3.5–5.1)

## 2015-06-11 MED ORDER — DOXERCALCIFEROL 4 MCG/2ML IV SOLN
1.0000 ug | INTRAVENOUS | Status: DC
  Administered 2015-06-11: 1 ug via INTRAVENOUS

## 2015-06-11 MED ORDER — SODIUM CHLORIDE 0.9 % IV SOLN: 100.0000 mL | INTRAVENOUS | Status: DC | PRN

## 2015-06-11 MED ORDER — LIDOCAINE-PRILOCAINE 2.5-2.5 % EX CREA: 1.0000 "application " | TOPICAL_CREAM | CUTANEOUS | Status: DC | PRN

## 2015-06-11 MED ORDER — ZIPRASIDONE HCL 20 MG PO CAPS
60.0000 mg | ORAL_CAPSULE | Freq: Once | ORAL | Status: AC
  Administered 2015-06-11: 60 mg via ORAL

## 2015-06-11 MED ORDER — SEVELAMER CARBONATE 800 MG PO TABS
1600.0000 mg | ORAL_TABLET | Freq: Three times a day (TID) | ORAL | Status: DC
  Administered 2015-06-11: 1600 mg via ORAL
  Filled 2015-06-11 (×5): qty 2

## 2015-06-11 MED ORDER — PENTAFLUOROPROP-TETRAFLUOROETH EX AERO: 1.0000 "application " | INHALATION_SPRAY | CUTANEOUS | Status: DC | PRN

## 2015-06-11 MED ORDER — LIDOCAINE HCL (PF) 1 % IJ SOLN: 5.0000 mL | INTRAMUSCULAR | Status: DC | PRN

## 2015-06-11 MED ORDER — DARBEPOETIN ALFA 100 MCG/0.5ML IJ SOSY
100.0000 ug | PREFILLED_SYRINGE | INTRAMUSCULAR | Status: DC
  Administered 2015-06-11: 100 ug via INTRAVENOUS

## 2015-06-11 MED ORDER — DARBEPOETIN ALFA 100 MCG/0.5ML IJ SOSY
PREFILLED_SYRINGE | INTRAMUSCULAR | Status: AC
  Filled 2015-06-11: qty 0.5

## 2015-06-11 MED ORDER — DOXERCALCIFEROL 4 MCG/2ML IV SOLN
INTRAVENOUS | Status: AC
  Filled 2015-06-11: qty 2

## 2015-06-11 MED ORDER — ALTEPLASE 2 MG IJ SOLR: 2.0000 mg | Freq: Once | INTRAMUSCULAR | Status: DC | PRN

## 2015-06-11 NOTE — ED Notes (Signed)
Patient gold black ring with security.

## 2015-06-11 NOTE — ED Notes (Signed)
Pt continues to ask to use the phone, this nurse instructed her that she has already received her two phone calls.

## 2015-06-11 NOTE — ED Notes (Signed)
Patient complains she did not get a shower patient was given a bed bath by sitter. Patient states she could not stand to be in the shower. Patient complains she wants to leave advised she is IVC. RN continues to redirect patient back to her room. RN has also had to advised patient she can not curse staff.

## 2015-06-11 NOTE — ED Provider Notes (Signed)
Pt awaiting placement Apparently awaiting repeat assessment She has had HD today BP 127/74 mmHg  Pulse 82  Temp(Src) 97.2 F (36.2 C) (Oral)  Resp 20  Wt 81.6 kg  SpO2 98%  LMP 05/09/2008 Pt is talkative   Ripley Fraise, MD 06/11/15 1717

## 2015-06-11 NOTE — ED Notes (Signed)
Patient given a snack 

## 2015-06-11 NOTE — ED Notes (Signed)
Patient states the food sticks and she wants a new lunch. RN reordered lunch for patient.

## 2015-06-11 NOTE — ED Notes (Signed)
Patient continues to have outburst. Patient screams, " why the fuck you wont let me leave". Patient also states, " I want to call my daughter." RN continues to redirect patient back to her room.

## 2015-06-11 NOTE — ED Notes (Signed)
Dinner served

## 2015-06-11 NOTE — Procedures (Signed)
Patient was seen on dialysis and the procedure was supervised.  BFR 400  Via AVF BP is  136/85.   Patient appears to be tolerating treatment well- doing HD treatment- she came into the ER with manic behavior- unsure what the disposition might be   Jennifer Lawson A 06/11/2015

## 2015-06-11 NOTE — ED Notes (Signed)
Patient returned from Hemodialysis.

## 2015-06-11 NOTE — ED Notes (Signed)
TTS in process 

## 2015-06-11 NOTE — ED Notes (Signed)
Patient removed telemetry monitor, not willing to wear.

## 2015-06-11 NOTE — Consult Note (Signed)
[facility] Telepsychiatry Consultation   Reason for Consult:  Psychosis Referring Physician:  EDP   Jennifer Lawson is an 55 y.o. female  Assessment:  Delirium due to another medical condition  Patient Active Problem List   Diagnosis Date Noted  . Delirium due to another medical condition 06/11/2015    Priority: High  . Hx of bipolar disorder 06/11/2015    Priority: Medium  . Hyperkalemia 04/23/2013    Priority: Medium  . Bipolar I disorder, most recent episode manic, severe with psychotic features ([place]) 06/01/2015  . Change in mental status 01/26/2015  . Anemia of renal disease 01/26/2015  . Pain in the chest   . Pleuritic chest pain 07/31/2014  . Symptomatic anemia 07/29/2014  . Absolute anemia   . CKD (chronic kidney disease) stage V requiring chronic dialysis ([place]) 05/13/2014  . Salicylate overdose 44/96/7591  . ESRD (end stage renal disease) ([place]) 01/16/2014  . COPD (chronic obstructive pulmonary disease) ([place]) 09/26/2013  . COPD exacerbation ([place]) 04/24/2013  . Acute respiratory failure ([place]) 04/23/2013  . Hypercalcemia 02/20/2013  . Hyperparathyroidism, primary ([place]) 02/20/2013  . Tobacco use disorder 11/13/2012  . Overdose of salicylate 63/84/6659  . HTN (hypertension) 02/20/2012      Subjective:   Jennifer Lawson is a 55 y.o. female reports to the ED with acute confusion vs. Psychosis vs. Delirium. Pt seen and chart reviewed. Pt is alert/oriented x4, calm, cooperative, and appropriate to situation. Pt denies suicidal/homicidal ideation and psychosis and does not appear to be responding to internal stimuli. However, during conversation, pt appeared to refer to topics that had already been discussed in detail. Nursing and Nurse Tech staff weighed in on this topic and report that every 2-3 minutes, the pt will ask the same questions again about simple topics such as the location of the bathroom or other things about the hospital, when they have already been addressed. Pt  was mildly agitated during discussion and did not appear to fully grasp the explanation that she was int he hospital for evaluation of her mental state in addition to HD, not just the HD treatment alone.   Based on pt's hx of CKD with HD treatment and recent severe electrolyte imbalances, we suspect acute delirium and overnight observation. Pt can receive HD for her CKD and be evaluated in AM by psychiatry. This provider left a message for family and I am awaiting a return call. Without being able to determine baseline from this collateral, we continue to affirm probable acute delirium. Pt's baseline state of mind is unknown and we will continue to call family to ascertain this information as soon as possible.   HPI:  Jennifer Lawson is an 55 y.o. female who presents voluntarily with her daughter, Conswella Bruney, to [facility]. Pt was irritable and initially refused to speak to counselor. Pt's daughter entered the room and was able to give basic information concerning pt's reason for ED admission. [name] indicated that she called 911 to bring her mother to the hospital b/c she was refusing to go to her dialysis appt. [name] reported that, upon her release from [facility] IP psychiatric trmt on 06/08/15, pt has been med compliant. [name] indicated that she did not believe that pt needed another IP psych, but she just wanted pt to get her dialysis. Pt minimally participated in the assessment, mainly responding to her daughter's statements concerning her refusal to go to dialysis. Pt's responses were relevant and she did not appear to be responding to internal stimuli.  [name] also shared that, upon release from [facility], pt has not been to sleep at all. [name] additionally expressed dissatisfaction with pt's current psychiatric provider, Dr. Rosine Door. Lastly, [name] discussed concerns about being able to continue to care for her mother, as pt cycles thru episodes of refusing to f/u with her psychiatric and dialysis  treatment, in addition to other things and starts to decompensate.   Pt spent a few hours in the ED without incident although has been repetitively asking staff the same questions every few minutes indicating possible AMS vs. Delirium.  Past Psychiatric History: Past Medical History  Diagnosis Date  . Mental disorder   . Depression   . Hypertension   . Overdose   . Tobacco use disorder 11/13/2012  . Complication of anesthesia     difficulty going to sleep  . Chronic kidney disease     06/11/13- not on dialysis  . Shortness of breath     lying down flat  . PTSD (post-traumatic stress disorder)   . Asthma   . COPD (chronic obstructive pulmonary disease) ([place])   . Heart murmur   . GERD (gastroesophageal reflux disease)   . Seizures ([place])     "passsed out"  . History of blood transfusion   . Diabetes mellitus without complication [facility])     denies    reports that she has been smoking Cigarettes and Cigars.  She has a 80 pack-year smoking history. She has never used smokeless tobacco. She reports that she does not drink alcohol or use illicit drugs. Family History  Problem Relation Age of Onset  . Diabetes Mother   . Hyperlipidemia Mother   . Hypertension Mother   . Diabetes Father   . Hypertension Father   . Hyperlipidemia Father    Family History Substance Abuse: No Family Supports: Yes, List: (daughter) Living Arrangements: Children Can pt return to current living arrangement?: Yes Allergies:   Allergies  Allergen Reactions  . Codeine Sulfate Anaphylaxis    Daughter called about having this allergy   . Gabapentin Other (See Comments) and Anaphylaxis    seizures  . Haldol [Haloperidol Lactate] Shortness Of Breath  . Risperidone And Related Shortness Of Breath  . Trazodone And Nefazodone Other (See Comments)    Makes pt lose balance and fall  . Depakote [Divalproex Sodium] Other (See Comments)    Nose bleeds-currently taking medication  . Haloperidol Other (See  Comments)    hallucinations  . Invega [Paliperidone Er] Nausea And Vomiting  . Risperidone Other (See Comments)    Hallucinations?  Marland Kitchen Vistaril [Hydroxyzine Hcl] Nausea And Vomiting  . Hydrocodone-Acetaminophen Itching    Objective: Blood pressure 132/77, pulse 87, temperature 98.4 F (36.9 C), temperature source Oral, resp. rate 22, last menstrual period 05/09/2008, SpO2 97 %.There is no weight on file to calculate BMI. Results for orders placed or performed during the hospital encounter of 06/10/15 (from the past 72 hour(s))  Urine rapid drug screen (hosp performed)not at [facility]     Status: None   Collection Time: 06/10/15  3:35 PM  Result Value Ref Range   Opiates NONE DETECTED NONE DETECTED   Cocaine NONE DETECTED NONE DETECTED   Benzodiazepines NONE DETECTED NONE DETECTED   Amphetamines NONE DETECTED NONE DETECTED   Tetrahydrocannabinol NONE DETECTED NONE DETECTED   Barbiturates NONE DETECTED NONE DETECTED    Comment:        DRUG SCREEN FOR MEDICAL PURPOSES ONLY.  IF CONFIRMATION IS NEEDED FOR ANY PURPOSE, NOTIFY LAB WITHIN 5  DAYS.        LOWEST DETECTABLE LIMITS FOR URINE DRUG SCREEN Drug Class       Cutoff (ng/mL) Amphetamine      1000 Barbiturate      200 Benzodiazepine   295 Tricyclics       188 Opiates          300 Cocaine          300 THC              50   Urinalysis, Routine w reflex microscopic (not at [facility])     Status: Abnormal   Collection Time: 06/10/15  3:35 PM  Result Value Ref Range   Color, Urine YELLOW YELLOW   APPearance CLEAR CLEAR   Specific Gravity, Urine 1.009 1.005 - 1.030   pH 7.5 5.0 - 8.0   Glucose, UA NEGATIVE NEGATIVE mg/dL   Hgb urine dipstick SMALL (A) NEGATIVE   Bilirubin Urine NEGATIVE NEGATIVE   Ketones, ur 15 (A) NEGATIVE mg/dL   Protein, ur >300 (A) NEGATIVE mg/dL   Nitrite NEGATIVE NEGATIVE   Leukocytes, UA NEGATIVE NEGATIVE  Urine microscopic-add on     Status: Abnormal   Collection Time: 06/10/15  3:35 PM  Result Value Ref  Range   Squamous Epithelial / LPF 0-5 (A) NONE SEEN   WBC, UA 0-5 0 - 5 WBC/hpf   RBC / HPF 0-5 0 - 5 RBC/hpf   Bacteria, UA RARE (A) NONE SEEN  CBC WITH DIFFERENTIAL     Status: Abnormal   Collection Time: 06/10/15  4:22 PM  Result Value Ref Range   WBC 5.0 4.0 - 10.5 K/uL   RBC 3.53 (L) 3.87 - 5.11 MIL/uL   Hemoglobin 10.9 (L) 12.0 - 15.0 g/dL   HCT 35.7 (L) 36.0 - 46.0 %   MCV 101.1 (H) 78.0 - 100.0 fL   MCH 30.9 26.0 - 34.0 pg   MCHC 30.5 30.0 - 36.0 g/dL   RDW 18.7 (H) 11.5 - 15.5 %   Platelets 160 150 - 400 K/uL   Neutrophils Relative % 61 %   Neutro Abs 3.1 1.7 - 7.7 K/uL   Lymphocytes Relative 28 %   Lymphs Abs 1.4 0.7 - 4.0 K/uL   Monocytes Relative 8 %   Monocytes Absolute 0.4 0.1 - 1.0 K/uL   Eosinophils Relative 1 %   Eosinophils Absolute 0.1 0.0 - 0.7 K/uL   Basophils Relative 1 %   Basophils Absolute 0.1 0.0 - 0.1 K/uL  Comprehensive metabolic panel     Status: Abnormal   Collection Time: 06/10/15  4:22 PM  Result Value Ref Range   Sodium 142 135 - 145 mmol/L   Potassium 6.4 (HH) 3.5 - 5.1 mmol/L    Comment: NO VISIBLE HEMOLYSIS CRITICAL RESULT CALLED TO, READ BACK BY AND VERIFIED WITH: H MORRISON,RN 1702 06/10/2015 WBOND    Chloride 107 101 - 111 mmol/L   CO2 21 (L) 22 - 32 mmol/L   Glucose, Bld 60 (L) 65 - 99 mg/dL   BUN 66 (H) 6 - 20 mg/dL   Creatinine, Ser 5.51 (H) 0.44 - 1.00 mg/dL   Calcium 9.8 8.9 - 10.3 mg/dL   Total Protein 6.8 6.5 - 8.1 g/dL   Albumin 3.1 (L) 3.5 - 5.0 g/dL   AST 19 15 - 41 U/L   ALT 12 (L) 14 - 54 U/L   Alkaline Phosphatase 134 (H) 38 - 126 U/L   Total Bilirubin 0.6 0.3 - 1.2 mg/dL  GFR calc non Af Amer 8 (L) >60 mL/min   GFR calc Af Amer 9 (L) >60 mL/min    Comment: (NOTE) The eGFR has been calculated using the CKD EPI equation. This calculation has not been validated in all clinical situations. eGFR's persistently <60 mL/min signify possible Chronic Kidney Disease.    Anion gap 14 5 - 15  Ethanol     Status: None    Collection Time: 06/10/15  4:22 PM  Result Value Ref Range   Alcohol, Ethyl (B) <5 <5 mg/dL    Comment:        LOWEST DETECTABLE LIMIT FOR SERUM ALCOHOL IS 5 mg/dL FOR MEDICAL PURPOSES ONLY   Valproic acid level     Status: Abnormal   Collection Time: 06/10/15  4:22 PM  Result Value Ref Range   Valproic Acid Lvl 42 (L) 50.0 - 100.0 ug/mL  Ammonia     Status: None   Collection Time: 06/10/15  4:23 PM  Result Value Ref Range   Ammonia 19 9 - 35 umol/L  Potassium     Status: None   Collection Time: 06/11/15  7:48 AM  Result Value Ref Range   Potassium 4.3 3.5 - 5.1 mmol/L   Labs are reviewed and are pertinent for N/A.   Current Facility-Administered Medications  Medication Dose Route Frequency Provider Last Rate Last Dose  . 0.9 %  sodium chloride infusion  100 mL Intravenous PRN Alric Seton, PA-C      . 0.9 %  sodium chloride infusion  100 mL Intravenous PRN Alric Seton, PA-C      . acetaminophen (TYLENOL) tablet 650 mg  650 mg Oral J6G PRN Delora Fuel, MD   836 mg at 06/11/15 0813  . albuterol (PROVENTIL HFA;VENTOLIN HFA) 108 (90 Base) MCG/ACT inhaler 2 puff  2 puff Inhalation O2H PRN Delora Fuel, MD      . albuterol (PROVENTIL) (2.5 MG/3ML) 0.083% nebulizer solution 2.5 mg  2.5 mg Nebulization U7M PRN Delora Fuel, MD      . alteplase (CATHFLO ACTIVASE) injection 2 mg  2 mg Intracatheter Once PRN Alric Seton, PA-C      . bisacodyl (DULCOLAX) EC tablet 5 mg  5 mg Oral Daily PRN Delora Fuel, MD      . cinacalcet [facility]) tablet 60 mg  60 mg Oral Q breakfast Delora Fuel, MD   60 mg at 54/65/03 0801  . Darbepoetin Alfa (ARANESP) injection 100 mcg  100 mcg Intravenous Q Thu-HD Alric Seton, PA-C      . divalproex (DEPAKOTE ER) 24 hr tablet 1,000 mg  1,000 mg Oral QHS Delora Fuel, MD   5,465 mg at 06/10/15 2222  . divalproex (DEPAKOTE ER) 24 hr tablet 500 mg  500 mg Oral q morning - 68L Delora Fuel, MD      . docusate sodium (COLACE) capsule 100 mg  100 mg Oral BID Delora Fuel, MD   275 mg at 06/10/15 2222  . doxepin (SINEQUAN) capsule 25 mg  25 mg Oral QHS Delora Fuel, MD   25 mg at 17/00/17 2238  . doxercalciferol (HECTOROL) injection 1 mcg  1 mcg Intravenous Q T,Th,Sa-HD Alric Seton, PA-C      . hydrocortisone (ANUSOL-HC) 2.5 % rectal cream   Rectal BID Delora Fuel, MD      . ibuprofen (ADVIL,MOTRIN) tablet 600 mg  600 mg Oral C9S PRN Delora Fuel, MD      . lidocaine (PF) (XYLOCAINE) 1 % injection 5 mL  5  mL Intradermal PRN Alric Seton, PA-C      . lidocaine-prilocaine (EMLA) cream 1 application  1 application Topical PRN Alric Seton, PA-C      . LORazepam (ATIVAN) tablet 1 mg  1 mg Oral E0P PRN Delora Fuel, MD   1 mg at 23/30/07 2342  . metoprolol tartrate (LOPRESSOR) tablet 25 mg  25 mg Oral BID Delora Fuel, MD   25 mg at 62/26/33 2222  . mometasone-formoterol (DULERA) 100-5 MCG/ACT inhaler 2 puff  2 puff Inhalation BID Delora Fuel, MD   2 puff at 06/11/15 0801  . multivitamin (RENA-VIT) tablet 1 tablet  1 tablet Oral QHS Delora Fuel, MD   1 tablet at 06/10/15 2238  . nicotine (NICODERM CQ - dosed in mg/24 hours) patch 21 mg  21 mg Transdermal Once Delora Fuel, MD   21 mg at 35/45/62 2221  . ondansetron (ZOFRAN) tablet 4 mg  4 mg Oral B6L PRN Delora Fuel, MD      . pantoprazole (PROTONIX) EC tablet 40 mg  40 mg Oral Daily Delora Fuel, MD   40 mg at 89/37/34 2237  . pentafluoroprop-tetrafluoroeth (GEBAUERS) aerosol 1 application  1 application Topical PRN Alric Seton, PA-C      . sevelamer carbonate (RENVELA) tablet 1,600 mg  1,600 mg Oral TID WC Delora Fuel, MD   2,876 mg at 06/11/15 0801  . simvastatin (ZOCOR) tablet 10 mg  10 mg Oral QHS Delora Fuel, MD   10 mg at 81/15/72 2238  . ziprasidone (GEODON) capsule 60 mg  60 mg Oral BID WC Delora Fuel, MD   60 mg at 62/03/55 9741  . zolpidem (AMBIEN) tablet 5 mg  5 mg Oral QHS PRN Delora Fuel, MD       Current Outpatient Prescriptions  Medication Sig Dispense Refill  . albuterol (PROVENTIL  HFA;VENTOLIN HFA) 108 (90 Base) MCG/ACT inhaler Inhale 2 puffs into the lungs every 6 (six) hours as needed for wheezing or shortness of breath. 1 Inhaler 0  . albuterol (PROVENTIL) (2.5 MG/3ML) 0.083% nebulizer solution Take 2.5 mg by nebulization every 6 (six) hours as needed for wheezing or shortness of breath.    Marland Kitchen b complex-vitamin c-folic acid (NEPHRO-VITE) 0.8 MG TABS tablet Take 1 tablet by mouth daily.    . bisacodyl (DULCOLAX) 5 MG EC tablet Take 1 tablet (5 mg total) by mouth daily as needed for moderate constipation. 30 tablet 0  . cinacalcet (SENSIPAR) 60 MG tablet Take 60 mg by mouth daily. Takes with largest meal once daily    . divalproex (DEPAKOTE ER) 500 MG 24 hr tablet Take 2 tablets (1,000 mg total) by mouth at bedtime. 60 tablet 0  . divalproex (DEPAKOTE ER) 500 MG 24 hr tablet Take 1 tablet (500 mg total) by mouth every morning. 30 tablet 0  . Docusate Sodium (DSS) 100 MG CAPS Take 100 mg by mouth 2 (two) times daily. 60 each 0  . doxepin (SINEQUAN) 25 MG capsule Take 1 capsule (25 mg total) by mouth at bedtime. 30 capsule 0  . hydrocortisone (ANUSOL-HC) 2.5 % rectal cream Place rectally 2 (two) times daily. 30 g 0  . LORazepam (ATIVAN) 0.5 MG tablet Take 0.5 mg by mouth every 8 (eight) hours as needed for anxiety or sleep.     . metoprolol tartrate (LOPRESSOR) 25 MG tablet Take 1 tablet (25 mg total) by mouth 2 (two) times daily. 60 tablet 0  . sevelamer carbonate (RENVELA) 800 MG tablet Take 1,600 mg by mouth  3 (three) times daily with meals. Takes 2 tabs with each meal and snacks as directed    . simvastatin (ZOCOR) 10 MG tablet Take 1 tablet (10 mg total) by mouth at bedtime. Reported on 05/30/2015 30 tablet 0  . ziprasidone (GEODON) 60 MG capsule Take 1 capsule (60 mg total) by mouth 2 (two) times daily with a meal. For mood control 60 capsule 0  . mometasone-formoterol (DULERA) 100-5 MCG/ACT AERO Inhale 2 puffs into the lungs 2 (two) times daily. 1 Inhaler 0  . pantoprazole  (PROTONIX) 40 MG tablet Take 1 tablet (40 mg total) by mouth daily. 30 tablet 0    Psychiatric Specialty Exam: Review of Systems  Psychiatric/Behavioral: Positive for depression. Negative for suicidal ideas and hallucinations. The patient is nervous/anxious and has insomnia.   All other systems reviewed and are negative.      Blood pressure 132/77, pulse 87, temperature 98.4 F (36.9 C), temperature source Oral, resp. rate 22, last menstrual period 05/09/2008, SpO2 97 %.There is no weight on file to calculate BMI.  General Appearance: Casual and Disheveled  Eye Contact::  Fair  Speech:  Clear and Coherent and Normal Rate  Volume:  Decreased  Mood:  Depressed  Affect:  Flat  Thought Process:  Circumstantial  Orientation:  Alert/oriented to self, place, time, not so much to situation  Thought Content:  Rumination  Suicidal Thoughts:  No  Homicidal Thoughts:  No  Memory:  Immediate;   Poor Recent;   Poor Remote;   Poor  Judgement:  Impaired  Insight:  Lacking  Psychomotor Activity:  Psychomotor Retardation  Concentration:  Poor  Recall:  Poor  Akathisia:  No  Handed:  Right  AIMS (if indicated):      Assets:  Leisure Time Physical Health Resilience Social Support  Sleep:    poor    Treatment Plan Summary: Medication Management  Disposition:  -Re-evaluate in AM to determine course of treatment  -Continue to attempt to obtain collateral from family about pt's baseline -Continue to monitor signs of acute delirium; EDP to get HD for pt at this time   Benjamine Mola FNP-BC 06/11/2015 10:38AM

## 2015-06-12 ENCOUNTER — Encounter (HOSPITAL_COMMUNITY): Payer: Self-pay | Admitting: Registered Nurse

## 2015-06-12 ENCOUNTER — Other Ambulatory Visit: Payer: Self-pay

## 2015-06-12 DIAGNOSIS — R410 Disorientation, unspecified: Secondary | ICD-10-CM

## 2015-06-12 DIAGNOSIS — F05 Delirium due to known physiological condition: Secondary | ICD-10-CM | POA: Diagnosis not present

## 2015-06-12 DIAGNOSIS — F312 Bipolar disorder, current episode manic severe with psychotic features: Secondary | ICD-10-CM | POA: Diagnosis not present

## 2015-06-12 LAB — CBG MONITORING, ED: Glucose-Capillary: 69 mg/dL (ref 65–99)

## 2015-06-12 MED ORDER — LORAZEPAM 2 MG/ML IJ SOLN
1.0000 mg | Freq: Once | INTRAMUSCULAR | Status: DC
Start: 2015-06-12 — End: 2015-06-12

## 2015-06-12 MED ORDER — ZIPRASIDONE MESYLATE 20 MG IM SOLR: 10.0000 mg | Freq: Once | INTRAMUSCULAR | Status: DC

## 2015-06-12 NOTE — ED Provider Notes (Signed)
Patient w agitated behavior, appears upset/angry, attempting to exit through ambulance bay.  Pt unable to be re-directed by staff, security and [employer] called to assist with getting patient back to room.  Pt remains agitated, shouting.   Will give geodon im, ativan im, for symptom relief.   Psych reassessment and placement pending.     Lajean Saver, MD 06/12/15 [phone]

## 2015-06-12 NOTE — ED Provider Notes (Addendum)
  Physical Exam  BP 125/79 mmHg  Pulse 84  Temp(Src) 98.7 F (37.1 C) (Oral)  Resp 18  Wt 179 lb 14.3 oz (81.6 kg)  SpO2 95%  LMP 05/09/2008  Physical Exam  ED Course  Procedures  MDM Patient was agitated this AM. Dr. Ashok Cordia ordered meds for sedation. She was agreeable to take PO meds but IM meds also ordered. Will monitor mental status. Still pending psych placement   11:26 AM Psych reassessed patient this morning. They think they patient is more annoyed than truly psychotic. Has follow up outpatient. Glucose was 60 on chemistry, CBG is 69 this morning. K repeated and was normal. Had dialysis yesterday. Psych request that I rescind IVC. Will discharge.     Wandra Arthurs, MD 06/12/15 [id number]  Wandra Arthurs, MD 06/12/15 [phone]

## 2015-06-12 NOTE — ED Notes (Addendum)
Pt became agitated and started running down the hallway, trying to "leave to go home".  Security and [employer] blocked pt escape route.  Pt began screaming and punching at this RN.  RN and Animal nutritionist escorted pt down hallway and she sat in the floor refusing to move.  Pt was taken by [employer] and security officer in wheelchair to room.  Pt continued to not follow directions.  Pt refused her regular medication.  Security standing at door.  MD notified.

## 2015-06-12 NOTE — ED Notes (Signed)
Pt hit call signal repeatedly to demand her [place] sandwich.  When this EMT informed pt that snack time had passed 3 hrs earlier, she replied that, "[place] lets you eat until midnight."  She again demanded to use the phone to call her MD since she had not seen her kidney doctor today.  She was reminded again that her 2 calls were already used today.  Her RN stated that all of this had been discussed repeatedly earlier today.

## 2015-06-12 NOTE — Discharge Instructions (Signed)
Please go to dialysis as scheduled.  Take your meds as prescribed.   See your primary care doctor, nephrologist, and psychiatrist   Return to ER if you have thoughts of harming yourself or others, hallucinations, fever, chest pain, abdominal pain, trouble breathing

## 2015-06-12 NOTE — ED Notes (Signed)
Gave pt juice and peanut butter crackers.  Encouraged pt to eat her lunch

## 2015-06-12 NOTE — Consult Note (Signed)
Telepsych Consultation   Reason for Consult:  acute confusion  Referring Physician:  EDP Patient Identification: Jennifer Lawson MRN:  [id number] Principal Diagnosis: Delirium due to another medical condition Diagnosis:   Patient Active Problem List   Diagnosis Date Noted  . Delirium due to another medical condition [F05] 06/11/2015  . Hx of bipolar disorder [Z86.59] 06/11/2015  . Bipolar I disorder, most recent episode manic, severe with psychotic features ([place]) [F31.2] 06/01/2015  . Change in mental status [R41.82] 01/26/2015  . Anemia of renal disease [D63.1] 01/26/2015  . Pain in the chest [R07.9]   . Pleuritic chest pain [R07.81] 07/31/2014  . Symptomatic anemia [D64.9] 07/29/2014  . Absolute anemia [D64.9]   . CKD (chronic kidney disease) stage V requiring chronic dialysis ([place]) [N18.6, Z99.2] 05/13/2014  . Salicylate overdose [T73.220U] 05/13/2014  . ESRD (end stage renal disease) ([place]) [N18.6] 01/16/2014  . COPD (chronic obstructive pulmonary disease) ([place]) [J44.9] 09/26/2013  . COPD exacerbation ([place]) [J44.1] 04/24/2013  . Hyperkalemia [E87.5] 04/23/2013  . Acute respiratory failure ([place]) [J96.00] 04/23/2013  . Hypercalcemia [E83.52] 02/20/2013  . Hyperparathyroidism, primary ([place]) [E21.0] 02/20/2013  . Tobacco use disorder [F17.200] 11/13/2012  . Overdose of salicylate [R42.706C] 37/62/8315  . HTN (hypertension) [I10] 02/20/2012    Total Time spent with patient: 30 minutes  Subjective:   Jennifer Lawson is a 55 y.o. female patient presented to [facility] ED with her daughter with complaints of patient refusing to go to dialysis appointment.  Patient kept over night after a face to face psych consult to be reassessed today after dialysis treatment to rule out acute confusion vs psychosis vs Delirium.Marland Kitchen  HPI:  Jennifer Lawson is a 55 y.o. female who presented to [facility] ED yesterday with acute confusion.  Provider unable to determine at time if patient suffered from  confusion vs psychosis vs delirium.  Patient reassess this morning.  Patient is alert/oriented x4 she is irritated that she has been kept in the hospital and argumentative related having a camera in her room and staff watching her.  Patient denies suicidal/homicidal ideation, psychosis, and paranoia.  Patient was able to verbally tell Day of week, year, date of birth, and age.  Patient does not appear to be responding to internal or external stimuli.  Patient states that she sees Dr. Rosine Door (psychiatrists) for outpatient services and has an appointment June 15, 2014.  Informs that her last visit to inpatient treatment [place] she was encouraged to "Get out of the house, be more active; and take care of myself."  Patient agitation and irritability is more so related to being in the hospital and stating "the medicine they are trying to give me doesn't look like my medicine."  Patient daughter not in the room at this time.      Past Psychiatric History: Bipolar Disorder; multiple inpatient psych admission last one being 05/18/15.  Outpatient services with [facility]- Dr. Wonda Amis.   Patient has an appointment with Dr. Rosine Door on June 15, 2015 at 11:00 am   Risk to Self: Suicidal Ideation: No Suicidal Intent: No Is patient at risk for suicide?: No Suicidal Plan?: No Access to Means: No What has been your use of drugs/alcohol within the last 12 months?: pt denies How many times?: 1 Other Self Harm Risks: refusing to go for dialysis Triggers for Past Attempts: Unpredictable Intentional Self Injurious Behavior: None Risk to Others: Homicidal Ideation: No Thoughts of Harm to Others: No Current Homicidal Intent: No Current Homicidal Plan: No Access  to Homicidal Means: No History of harm to others?: No Assessment of Violence: None Noted Violent Behavior Description: pt generally calm for assessment Does patient have access to weapons?: No Criminal Charges Pending?: No Does patient have a court  date: No Prior Inpatient Therapy: Prior Inpatient Therapy: Yes Prior Therapy Dates:  (2016; 2017) Prior Therapy Facilty/Provider(s):  [facility]; [facility]) Reason for Treatment: Psychosis  Prior Outpatient Therapy: Prior Outpatient Therapy: Yes Prior Therapy Dates: ongoing Prior Therapy Facilty/Provider(s): [facility]  Reason for Treatment: Bipolar disorder Does patient have an ACCT team?: No Does patient have Intensive In-House Services?  : No Does patient have [facility] services? : No Does patient have P4CC services?: No  Past Medical History:  Past Medical History  Diagnosis Date  . Mental disorder   . Depression   . Hypertension   . Overdose   . Tobacco use disorder 11/13/2012  . Complication of anesthesia     difficulty going to sleep  . Chronic kidney disease     06/11/13- not on dialysis  . Shortness of breath     lying down flat  . PTSD (post-traumatic stress disorder)   . Asthma   . COPD (chronic obstructive pulmonary disease) ([place])   . Heart murmur   . GERD (gastroesophageal reflux disease)   . Seizures ([place])     "passsed out"  . History of blood transfusion   . Diabetes mellitus without complication [facility])     denies    Past Surgical History  Procedure Laterality Date  . Right knee replacement      she says it was last year.  . Esophagogastroduodenoscopy Left 11/14/2012    Procedure: ESOPHAGOGASTRODUODENOSCOPY (EGD);  Surgeon: [name], MD;  Location: [facility] ENDOSCOPY;  Service: Endoscopy;  Laterality: Left;  . Joint replacement Right     knee  . Parathyroidectomy    . Av fistula placement Right 06/12/2013    Procedure: ARTERIOVENOUS (AV) FISTULA CREATION; RIGHT  BASILIC VEIN TRANSPOSITION with Intraoperative ultrasound;  Surgeon: Mal [name], MD;  Location: [facility] OR;  Service: Vascular;  Laterality: Right;   Family History:  Family History  Problem Relation Age of Onset  . Diabetes Mother   . Hyperlipidemia Mother   . Hypertension Mother   . Diabetes  Father   . Hypertension Father   . Hyperlipidemia Father    Family Psychiatric  History: Social History: None reported History  Alcohol Use No    Comment: none in over a month per daughter     History  Drug Use No    Comment: pt denied any drug use    Social History   Social History  . Marital Status: Divorced    Spouse Name: N/A  . Number of Children: N/A  . Years of Education: N/A   Social History Main Topics  . Smoking status: Current Every Day Smoker -- 2.00 packs/day for 40 years    Types: Cigarettes, Cigars  . Smokeless tobacco: Never Used  . Alcohol Use: No     Comment: none in over a month per daughter  . Drug Use: No     Comment: pt denied any drug use  . Sexual Activity: No   Other Topics Concern  . None   Social History Narrative   Additional Social History:    Allergies:   Allergies  Allergen Reactions  . Codeine Sulfate Anaphylaxis    Daughter called about having this allergy   . Gabapentin Other (See Comments) and Anaphylaxis  seizures  . Haldol [Haloperidol Lactate] Shortness Of Breath  . Risperidone And Related Shortness Of Breath  . Trazodone And Nefazodone Other (See Comments)    Makes pt lose balance and fall  . Depakote [Divalproex Sodium] Other (See Comments)    Nose bleeds-currently taking medication  . Haloperidol Other (See Comments)    hallucinations  . Invega [Paliperidone Er] Nausea And Vomiting  . Risperidone Other (See Comments)    Hallucinations?  Marland Kitchen Vistaril [Hydroxyzine Hcl] Nausea And Vomiting  . Hydrocodone-Acetaminophen Itching    Labs:  Results for orders placed or performed during the hospital encounter of 06/10/15 (from the past 48 hour(s))  Urine rapid drug screen (hosp performed)not at [facility]     Status: None   Collection Time: 06/10/15  3:35 PM  Result Value Ref Range   Opiates NONE DETECTED NONE DETECTED   Cocaine NONE DETECTED NONE DETECTED   Benzodiazepines NONE DETECTED NONE DETECTED   Amphetamines NONE  DETECTED NONE DETECTED   Tetrahydrocannabinol NONE DETECTED NONE DETECTED   Barbiturates NONE DETECTED NONE DETECTED    Comment:        DRUG SCREEN FOR MEDICAL PURPOSES ONLY.  IF CONFIRMATION IS NEEDED FOR ANY PURPOSE, NOTIFY LAB WITHIN 5 DAYS.        LOWEST DETECTABLE LIMITS FOR URINE DRUG SCREEN Drug Class       Cutoff (ng/mL) Amphetamine      1000 Barbiturate      200 Benzodiazepine   841 Tricyclics       324 Opiates          300 Cocaine          300 THC              50   Urinalysis, Routine w reflex microscopic (not at [facility])     Status: Abnormal   Collection Time: 06/10/15  3:35 PM  Result Value Ref Range   Color, Urine YELLOW YELLOW   APPearance CLEAR CLEAR   Specific Gravity, Urine 1.009 1.005 - 1.030   pH 7.5 5.0 - 8.0   Glucose, UA NEGATIVE NEGATIVE mg/dL   Hgb urine dipstick SMALL (A) NEGATIVE   Bilirubin Urine NEGATIVE NEGATIVE   Ketones, ur 15 (A) NEGATIVE mg/dL   Protein, ur >300 (A) NEGATIVE mg/dL   Nitrite NEGATIVE NEGATIVE   Leukocytes, UA NEGATIVE NEGATIVE  Urine microscopic-add on     Status: Abnormal   Collection Time: 06/10/15  3:35 PM  Result Value Ref Range   Squamous Epithelial / LPF 0-5 (A) NONE SEEN   WBC, UA 0-5 0 - 5 WBC/hpf   RBC / HPF 0-5 0 - 5 RBC/hpf   Bacteria, UA RARE (A) NONE SEEN  CBC WITH DIFFERENTIAL     Status: Abnormal   Collection Time: 06/10/15  4:22 PM  Result Value Ref Range   WBC 5.0 4.0 - 10.5 K/uL   RBC 3.53 (L) 3.87 - 5.11 MIL/uL   Hemoglobin 10.9 (L) 12.0 - 15.0 g/dL   HCT 35.7 (L) 36.0 - 46.0 %   MCV 101.1 (H) 78.0 - 100.0 fL   MCH 30.9 26.0 - 34.0 pg   MCHC 30.5 30.0 - 36.0 g/dL   RDW 18.7 (H) 11.5 - 15.5 %   Platelets 160 150 - 400 K/uL   Neutrophils Relative % 61 %   Neutro Abs 3.1 1.7 - 7.7 K/uL   Lymphocytes Relative 28 %   Lymphs Abs 1.4 0.7 - 4.0 K/uL   Monocytes Relative 8 %  Monocytes Absolute 0.4 0.1 - 1.0 K/uL   Eosinophils Relative 1 %   Eosinophils Absolute 0.1 0.0 - 0.7 K/uL   Basophils  Relative 1 %   Basophils Absolute 0.1 0.0 - 0.1 K/uL  Comprehensive metabolic panel     Status: Abnormal   Collection Time: 06/10/15  4:22 PM  Result Value Ref Range   Sodium 142 135 - 145 mmol/L   Potassium 6.4 (HH) 3.5 - 5.1 mmol/L    Comment: NO VISIBLE HEMOLYSIS CRITICAL RESULT CALLED TO, READ BACK BY AND VERIFIED WITH: H MORRISON,RN 1702 06/10/2015 WBOND    Chloride 107 101 - 111 mmol/L   CO2 21 (L) 22 - 32 mmol/L   Glucose, Bld 60 (L) 65 - 99 mg/dL   BUN 66 (H) 6 - 20 mg/dL   Creatinine, Ser 5.51 (H) 0.44 - 1.00 mg/dL   Calcium 9.8 8.9 - 10.3 mg/dL   Total Protein 6.8 6.5 - 8.1 g/dL   Albumin 3.1 (L) 3.5 - 5.0 g/dL   AST 19 15 - 41 U/L   ALT 12 (L) 14 - 54 U/L   Alkaline Phosphatase 134 (H) 38 - 126 U/L   Total Bilirubin 0.6 0.3 - 1.2 mg/dL   GFR calc non Af Amer 8 (L) >60 mL/min   GFR calc Af Amer 9 (L) >60 mL/min    Comment: (NOTE) The eGFR has been calculated using the CKD EPI equation. This calculation has not been validated in all clinical situations. eGFR's persistently <60 mL/min signify possible Chronic Kidney Disease.    Anion gap 14 5 - 15  Ethanol     Status: None   Collection Time: 06/10/15  4:22 PM  Result Value Ref Range   Alcohol, Ethyl (B) <5 <5 mg/dL    Comment:        LOWEST DETECTABLE LIMIT FOR SERUM ALCOHOL IS 5 mg/dL FOR MEDICAL PURPOSES ONLY   Valproic acid level     Status: Abnormal   Collection Time: 06/10/15  4:22 PM  Result Value Ref Range   Valproic Acid Lvl 42 (L) 50.0 - 100.0 ug/mL  Ammonia     Status: None   Collection Time: 06/10/15  4:23 PM  Result Value Ref Range   Ammonia 19 9 - 35 umol/L  Potassium     Status: None   Collection Time: 06/11/15  7:48 AM  Result Value Ref Range   Potassium 4.3 3.5 - 5.1 mmol/L    Current Facility-Administered Medications  Medication Dose Route Frequency Provider Last Rate Last Dose  . 0.9 %  sodium chloride infusion  100 mL Intravenous PRN Alric Seton, PA-C      . 0.9 %  sodium  chloride infusion  100 mL Intravenous PRN Alric Seton, PA-C      . acetaminophen (TYLENOL) tablet 650 mg  650 mg Oral Z6X PRN Delora Fuel, MD   096 mg at 06/11/15 2247  . albuterol (PROVENTIL HFA;VENTOLIN HFA) 108 (90 Base) MCG/ACT inhaler 2 puff  2 puff Inhalation E4V PRN Delora Fuel, MD      . albuterol (PROVENTIL) (2.5 MG/3ML) 0.083% nebulizer solution 2.5 mg  2.5 mg Nebulization W0J PRN Delora Fuel, MD      . alteplase (CATHFLO ACTIVASE) injection 2 mg  2 mg Intracatheter Once PRN Alric Seton, PA-C      . bisacodyl (DULCOLAX) EC tablet 5 mg  5 mg Oral Daily PRN Delora Fuel, MD      . cinacalcet [facility]) tablet 60 mg  60  mg Oral Q breakfast Delora Fuel, MD   60 mg at 50/93/26 0801  . Darbepoetin Alfa (ARANESP) injection 100 mcg  100 mcg Intravenous Q Thu-HD Alric Seton, PA-C   100 mcg at 06/11/15 1350  . divalproex (DEPAKOTE ER) 24 hr tablet 1,000 mg  1,000 mg Oral QHS Delora Fuel, MD   7,124 mg at 06/11/15 2111  . divalproex (DEPAKOTE ER) 24 hr tablet 500 mg  500 mg Oral q morning - 58K Delora Fuel, MD   998 mg at 06/11/15 1554  . docusate sodium (COLACE) capsule 100 mg  100 mg Oral BID Delora Fuel, MD   338 mg at 06/11/15 2114  . doxercalciferol (HECTOROL) injection 1 mcg  1 mcg Intravenous Q T,Th,Sa-HD Alric Seton, PA-C   1 mcg at 06/11/15 1351  . hydrocortisone (ANUSOL-HC) 2.5 % rectal cream   Rectal BID Delora Fuel, MD      . ibuprofen (ADVIL,MOTRIN) tablet 600 mg  600 mg Oral S5K PRN Delora Fuel, MD      . lidocaine (PF) (XYLOCAINE) 1 % injection 5 mL  5 mL Intradermal PRN Alric Seton, PA-C      . lidocaine-prilocaine (EMLA) cream 1 application  1 application Topical PRN Alric Seton, PA-C      . LORazepam (ATIVAN) injection 1 mg  1 mg Intramuscular Once Lajean Saver, MD      . LORazepam (ATIVAN) tablet 1 mg  1 mg Oral N3Z PRN Delora Fuel, MD   1 mg at 76/73/41 0844  . metoprolol tartrate (LOPRESSOR) tablet 25 mg  25 mg Oral BID Delora Fuel, MD   25 mg at 93/79/02 2114   . mometasone-formoterol (DULERA) 100-5 MCG/ACT inhaler 2 puff  2 puff Inhalation BID Delora Fuel, MD   2 puff at 06/11/15 2038  . multivitamin (RENA-VIT) tablet 1 tablet  1 tablet Oral QHS Delora Fuel, MD   1 tablet at 06/11/15 2111  . [COMPLETED] nicotine (NICODERM CQ - dosed in mg/24 hours) patch 21 mg  21 mg Transdermal Once Delora Fuel, MD   21 mg at 40/97/35 2132  . ondansetron (ZOFRAN) tablet 4 mg  4 mg Oral H2D PRN Delora Fuel, MD      . pantoprazole (PROTONIX) EC tablet 40 mg  40 mg Oral Daily Delora Fuel, MD   40 mg at 92/42/68 1554  . pentafluoroprop-tetrafluoroeth (GEBAUERS) aerosol 1 application  1 application Topical PRN Alric Seton, PA-C      . sevelamer carbonate (RENVELA) tablet 1,600 mg  1,600 mg Oral TID WC Roma Schanz, RPH   1,600 mg at 06/11/15 1900  . simvastatin (ZOCOR) tablet 10 mg  10 mg Oral QHS Delora Fuel, MD   10 mg at 34/19/62 2114  . ziprasidone (GEODON) capsule 60 mg  60 mg Oral BID WC Delora Fuel, MD   60 mg at 22/97/98 0844  . ziprasidone (GEODON) injection 10 mg  10 mg Intramuscular Once Lajean Saver, MD      . zolpidem [facility]) tablet 5 mg  5 mg Oral QHS PRN Delora Fuel, MD   5 mg at 92/11/94 2133   Current Outpatient Prescriptions  Medication Sig Dispense Refill  . albuterol (PROVENTIL HFA;VENTOLIN HFA) 108 (90 Base) MCG/ACT inhaler Inhale 2 puffs into the lungs every 6 (six) hours as needed for wheezing or shortness of breath. 1 Inhaler 0  . albuterol (PROVENTIL) (2.5 MG/3ML) 0.083% nebulizer solution Take 2.5 mg by nebulization every 6 (six) hours as needed for wheezing or shortness of breath.    Marland Kitchen  b complex-vitamin c-folic acid (NEPHRO-VITE) 0.8 MG TABS tablet Take 1 tablet by mouth daily.    . bisacodyl (DULCOLAX) 5 MG EC tablet Take 1 tablet (5 mg total) by mouth daily as needed for moderate constipation. 30 tablet 0  . cinacalcet (SENSIPAR) 60 MG tablet Take 60 mg by mouth daily. Takes with largest meal once daily    . divalproex (DEPAKOTE ER)  500 MG 24 hr tablet Take 2 tablets (1,000 mg total) by mouth at bedtime. 60 tablet 0  . divalproex (DEPAKOTE ER) 500 MG 24 hr tablet Take 1 tablet (500 mg total) by mouth every morning. 30 tablet 0  . Docusate Sodium (DSS) 100 MG CAPS Take 100 mg by mouth 2 (two) times daily. 60 each 0  . doxepin (SINEQUAN) 25 MG capsule Take 1 capsule (25 mg total) by mouth at bedtime. 30 capsule 0  . hydrocortisone (ANUSOL-HC) 2.5 % rectal cream Place rectally 2 (two) times daily. 30 g 0  . LORazepam (ATIVAN) 0.5 MG tablet Take 0.5 mg by mouth every 8 (eight) hours as needed for anxiety or sleep.     . metoprolol tartrate (LOPRESSOR) 25 MG tablet Take 1 tablet (25 mg total) by mouth 2 (two) times daily. 60 tablet 0  . sevelamer carbonate (RENVELA) 800 MG tablet Take 1,600 mg by mouth 3 (three) times daily with meals. Takes 2 tabs with each meal and snacks as directed    . simvastatin (ZOCOR) 10 MG tablet Take 1 tablet (10 mg total) by mouth at bedtime. Reported on 05/30/2015 30 tablet 0  . ziprasidone (GEODON) 60 MG capsule Take 1 capsule (60 mg total) by mouth 2 (two) times daily with a meal. For mood control 60 capsule 0  . mometasone-formoterol (DULERA) 100-5 MCG/ACT AERO Inhale 2 puffs into the lungs 2 (two) times daily. 1 Inhaler 0  . pantoprazole (PROTONIX) 40 MG tablet Take 1 tablet (40 mg total) by mouth daily. 30 tablet 0    Musculoskeletal: Strength & Muscle Tone: Unable to determine if any weakness Gait & Station: Patient in bed; did not see patient ambulate Patient leans: N/A  Psychiatric Specialty Exam: ROS  Blood pressure 125/79, pulse 84, temperature 98.7 F (37.1 C), temperature source Oral, resp. rate 18, weight 81.6 kg (179 lb 14.3 oz), last menstrual period 05/09/2008, SpO2 95 %.Body mass index is 32.89 kg/(m^2).  General Appearance: Disheveled  Eye Contact::  Good  Speech:  Clear and Coherent and Normal Rate  Volume:  Increased  Mood:  Irritable and argumentative  Affect:  Appropriate   Thought Process:  Linear  Orientation:  Full (Time, Place, and Person)  Thought Content:  Rumination  Suicidal Thoughts:  No  Homicidal Thoughts:  No  Memory:  Immediate;   Fair Recent;   Fair Remote;   Fair  Judgement:  Fair  Insight:  Present  Psychomotor Activity:  Normal  Concentration:  Fair  Recall:  Good  Fund of Knowledge:Fair  Language: Good  Akathisia:  No  Handed:  Right  AIMS (if indicated):     Assets:  Communication Skills Desire for Improvement Housing Social Support  ADL's:  Intact  Cognition: WNL  Sleep:       Consulted with Dr. Doree Fudge (EDP) informed patient cleared psychiatric assessment and once medically cleared he could discharge; patient did not need inpatient psych treatment.  Patient could continue her current psychotropic medications and to keep her follow up appointment with Dr. Rosine Door.    Treatment Plan Summary: Plan EDP  may discharge when patient medical cleared.  Patient to keep scheduled appointment with Dr. Rosine Door 06/15/15 at 11:00 am.   and Continue with current psychotropic medications.  Disposition: No evidence of imminent risk to self or others at present.   Patient does not meet criteria for psychiatric inpatient admission. Continue with current psychotropic medications.  Keep follow up appointment with [facility]- Dr. Wonda Amis. 06/15/15 at 11:00 am  Rankin, Delphia Grates, NP 06/12/2015 11:17 AM

## 2015-06-12 NOTE — ED Notes (Signed)
Called into pt room, pt states that she would like her BP checked because she knows it is high because her chest hurts. Pt states that she would like another meal, and that she has not been fed. Pt also requesting for this writer to bring the computer into her room and count every medication she has had today, stating "I have had over thirty pills today on an empty stomach"

## 2015-06-12 NOTE — ED Notes (Signed)
Faxed pt IVC resend paperwork.  Pt signed for her belongings.  Pt getting dressed.

## 2015-07-03 ENCOUNTER — Emergency Department (HOSPITAL_COMMUNITY): Payer: Medicare Other

## 2015-07-03 ENCOUNTER — Inpatient Hospital Stay (HOSPITAL_COMMUNITY)
Admission: EM | Admit: 2015-07-03 | Discharge: 2015-07-08 | DRG: 871 | Disposition: A | Discharge status: Home / Self Care | Payer: Medicare Other | Attending: Internal Medicine | Admitting: Internal Medicine

## 2015-07-03 ENCOUNTER — Encounter (HOSPITAL_COMMUNITY): Payer: Self-pay | Admitting: Nurse Practitioner

## 2015-07-03 DIAGNOSIS — Z96651 Presence of right artificial knee joint: Secondary | ICD-10-CM | POA: Diagnosis present

## 2015-07-03 DIAGNOSIS — Z7951 Long term (current) use of inhaled steroids: Secondary | ICD-10-CM | POA: Diagnosis not present

## 2015-07-03 DIAGNOSIS — Z8249 Family history of ischemic heart disease and other diseases of the circulatory system: Secondary | ICD-10-CM | POA: Diagnosis not present

## 2015-07-03 DIAGNOSIS — D631 Anemia in chronic kidney disease: Secondary | ICD-10-CM | POA: Diagnosis present

## 2015-07-03 DIAGNOSIS — G9340 Encephalopathy, unspecified: Secondary | ICD-10-CM | POA: Diagnosis present

## 2015-07-03 DIAGNOSIS — Z9981 Dependence on supplemental oxygen: Secondary | ICD-10-CM | POA: Diagnosis not present

## 2015-07-03 DIAGNOSIS — J123 Human metapneumovirus pneumonia: Secondary | ICD-10-CM | POA: Diagnosis present

## 2015-07-03 DIAGNOSIS — M899 Disorder of bone, unspecified: Secondary | ICD-10-CM | POA: Diagnosis present

## 2015-07-03 DIAGNOSIS — A419 Sepsis, unspecified organism: Principal | ICD-10-CM

## 2015-07-03 DIAGNOSIS — F17210 Nicotine dependence, cigarettes, uncomplicated: Secondary | ICD-10-CM | POA: Diagnosis present

## 2015-07-03 DIAGNOSIS — J189 Pneumonia, unspecified organism: Secondary | ICD-10-CM | POA: Diagnosis not present

## 2015-07-03 DIAGNOSIS — E785 Hyperlipidemia, unspecified: Secondary | ICD-10-CM | POA: Diagnosis present

## 2015-07-03 DIAGNOSIS — R1310 Dysphagia, unspecified: Secondary | ICD-10-CM | POA: Diagnosis present

## 2015-07-03 DIAGNOSIS — J9600 Acute respiratory failure, unspecified whether with hypoxia or hypercapnia: Secondary | ICD-10-CM | POA: Diagnosis present

## 2015-07-03 DIAGNOSIS — J384 Edema of larynx: Secondary | ICD-10-CM | POA: Diagnosis not present

## 2015-07-03 DIAGNOSIS — R569 Unspecified convulsions: Secondary | ICD-10-CM | POA: Diagnosis present

## 2015-07-03 DIAGNOSIS — Z992 Dependence on renal dialysis: Secondary | ICD-10-CM

## 2015-07-03 DIAGNOSIS — J440 Chronic obstructive pulmonary disease with acute lower respiratory infection: Secondary | ICD-10-CM | POA: Diagnosis present

## 2015-07-03 DIAGNOSIS — Y95 Nosocomial condition: Secondary | ICD-10-CM | POA: Diagnosis present

## 2015-07-03 DIAGNOSIS — Z885 Allergy status to narcotic agent status: Secondary | ICD-10-CM | POA: Diagnosis not present

## 2015-07-03 DIAGNOSIS — E1122 Type 2 diabetes mellitus with diabetic chronic kidney disease: Secondary | ICD-10-CM | POA: Diagnosis present

## 2015-07-03 DIAGNOSIS — Z888 Allergy status to other drugs, medicaments and biological substances status: Secondary | ICD-10-CM

## 2015-07-03 DIAGNOSIS — J9621 Acute and chronic respiratory failure with hypoxia: Secondary | ICD-10-CM | POA: Diagnosis present

## 2015-07-03 DIAGNOSIS — J45909 Unspecified asthma, uncomplicated: Secondary | ICD-10-CM | POA: Diagnosis present

## 2015-07-03 DIAGNOSIS — Z833 Family history of diabetes mellitus: Secondary | ICD-10-CM

## 2015-07-03 DIAGNOSIS — K219 Gastro-esophageal reflux disease without esophagitis: Secondary | ICD-10-CM | POA: Diagnosis present

## 2015-07-03 DIAGNOSIS — I959 Hypotension, unspecified: Secondary | ICD-10-CM | POA: Diagnosis present

## 2015-07-03 DIAGNOSIS — N186 End stage renal disease: Secondary | ICD-10-CM | POA: Diagnosis present

## 2015-07-03 DIAGNOSIS — I120 Hypertensive chronic kidney disease with stage 5 chronic kidney disease or end stage renal disease: Secondary | ICD-10-CM | POA: Diagnosis present

## 2015-07-03 DIAGNOSIS — F4310 Post-traumatic stress disorder, unspecified: Secondary | ICD-10-CM | POA: Diagnosis present

## 2015-07-03 DIAGNOSIS — I10 Essential (primary) hypertension: Secondary | ICD-10-CM | POA: Diagnosis not present

## 2015-07-03 DIAGNOSIS — F3162 Bipolar disorder, current episode mixed, moderate: Secondary | ICD-10-CM | POA: Diagnosis not present

## 2015-07-03 DIAGNOSIS — J441 Chronic obstructive pulmonary disease with (acute) exacerbation: Secondary | ICD-10-CM | POA: Diagnosis present

## 2015-07-03 DIAGNOSIS — F319 Bipolar disorder, unspecified: Secondary | ICD-10-CM | POA: Diagnosis present

## 2015-07-03 DIAGNOSIS — Z79899 Other long term (current) drug therapy: Secondary | ICD-10-CM | POA: Diagnosis not present

## 2015-07-03 DIAGNOSIS — J181 Lobar pneumonia, unspecified organism: Secondary | ICD-10-CM

## 2015-07-03 DIAGNOSIS — R0602 Shortness of breath: Secondary | ICD-10-CM

## 2015-07-03 DIAGNOSIS — E890 Postprocedural hypothyroidism: Secondary | ICD-10-CM | POA: Diagnosis present

## 2015-07-03 DIAGNOSIS — J9601 Acute respiratory failure with hypoxia: Secondary | ICD-10-CM

## 2015-07-03 LAB — INFLUENZA PANEL BY PCR (TYPE A & B)
H1N1 flu by pcr: NOT DETECTED
Influenza A By PCR: NEGATIVE
Influenza B By PCR: NEGATIVE

## 2015-07-03 LAB — I-STAT VENOUS BLOOD GAS, ED
Acid-Base Excess: 2 mmol/L (ref 0.0–2.0)
Bicarbonate: 31.3 mEq/L — ABNORMAL HIGH (ref 20.0–24.0)
O2 Saturation: 82 %
TCO2: 33 mmol/L (ref 0–100)
pCO2, Ven: 70.2 mmHg (ref 45.0–50.0)
pH, Ven: 7.258 (ref 7.250–7.300)
pO2, Ven: 55 mmHg — ABNORMAL HIGH (ref 30.0–45.0)

## 2015-07-03 LAB — BLOOD GAS, VENOUS

## 2015-07-03 LAB — COMPREHENSIVE METABOLIC PANEL WITH GFR
ALT: 9 U/L — ABNORMAL LOW (ref 14–54)
AST: 19 U/L (ref 15–41)
Albumin: 2.6 g/dL — ABNORMAL LOW (ref 3.5–5.0)
Alkaline Phosphatase: 113 U/L (ref 38–126)
Anion gap: 12 (ref 5–15)
BUN: 23 mg/dL — ABNORMAL HIGH (ref 6–20)
CO2: 28 mmol/L (ref 22–32)
Calcium: 9.4 mg/dL (ref 8.9–10.3)
Chloride: 102 mmol/L (ref 101–111)
Creatinine, Ser: 3.88 mg/dL — ABNORMAL HIGH (ref 0.44–1.00)
GFR calc Af Amer: 14 mL/min — ABNORMAL LOW
GFR calc non Af Amer: 12 mL/min — ABNORMAL LOW
Glucose, Bld: 73 mg/dL (ref 65–99)
Potassium: 4.5 mmol/L (ref 3.5–5.1)
Sodium: 142 mmol/L (ref 135–145)
Total Bilirubin: 0.5 mg/dL (ref 0.3–1.2)
Total Protein: 6.9 g/dL (ref 6.5–8.1)

## 2015-07-03 LAB — CBC WITH DIFFERENTIAL/PLATELET
Basophils Absolute: 0 K/uL (ref 0.0–0.1)
Basophils Relative: 0 %
Eosinophils Absolute: 0 K/uL (ref 0.0–0.7)
Eosinophils Relative: 0 %
HCT: 37.6 % (ref 36.0–46.0)
Hemoglobin: 11 g/dL — ABNORMAL LOW (ref 12.0–15.0)
Lymphocytes Relative: 20 %
Lymphs Abs: 1 K/uL (ref 0.7–4.0)
MCH: 29.6 pg (ref 26.0–34.0)
MCHC: 29.3 g/dL — ABNORMAL LOW (ref 30.0–36.0)
MCV: 101.3 fL — ABNORMAL HIGH (ref 78.0–100.0)
Monocytes Absolute: 0.5 K/uL (ref 0.1–1.0)
Monocytes Relative: 11 %
Neutro Abs: 3.2 K/uL (ref 1.7–7.7)
Neutrophils Relative %: 69 %
Platelets: 124 K/uL — ABNORMAL LOW (ref 150–400)
RBC: 3.71 MIL/uL — ABNORMAL LOW (ref 3.87–5.11)
RDW: 19.9 % — ABNORMAL HIGH (ref 11.5–15.5)
WBC: 4.8 K/uL (ref 4.0–10.5)

## 2015-07-03 LAB — GLUCOSE, CAPILLARY
Glucose-Capillary: 68 mg/dL (ref 65–99)
Glucose-Capillary: 151 mg/dL — ABNORMAL HIGH (ref 65–99)

## 2015-07-03 LAB — I-STAT ARTERIAL BLOOD GAS, ED
Acid-Base Excess: 2 mmol/L (ref 0.0–2.0)
Bicarbonate: 30.1 mEq/L — ABNORMAL HIGH (ref 20.0–24.0)
O2 Saturation: 100 %
TCO2: 32 mmol/L (ref 0–100)
pCO2 arterial: 62.5 mmHg (ref 35.0–45.0)
pH, Arterial: 7.291 — ABNORMAL LOW (ref 7.350–7.450)
pO2, Arterial: 220 mmHg — ABNORMAL HIGH (ref 80.0–100.0)

## 2015-07-03 LAB — I-STAT CG4 LACTIC ACID, ED: Lactic Acid, Venous: 0.83 mmol/L (ref 0.5–2.0)

## 2015-07-03 LAB — MRSA PCR SCREENING: MRSA by PCR: NEGATIVE

## 2015-07-03 LAB — PROCALCITONIN: Procalcitonin: 0.46 ng/mL

## 2015-07-03 MED ORDER — MAGNESIUM SULFATE 2 GM/50ML IV SOLN
2.0000 g | Freq: Once | INTRAVENOUS | Status: AC
  Administered 2015-07-03: 2 g via INTRAVENOUS
  Filled 2015-07-03: qty 50

## 2015-07-03 MED ORDER — BISACODYL 10 MG RE SUPP: 10.0000 mg | Freq: Every day | RECTAL | Status: DC | PRN

## 2015-07-03 MED ORDER — PANTOPRAZOLE SODIUM 40 MG IV SOLR
40.0000 mg | INTRAVENOUS | Status: DC
  Administered 2015-07-03 – 2015-07-04 (×2): 40 mg via INTRAVENOUS
  Filled 2015-07-03 (×2): qty 40

## 2015-07-03 MED ORDER — PROPOFOL 1000 MG/100ML IV EMUL
0.0000 ug/kg/min | INTRAVENOUS | Status: DC
Start: 2015-07-03 — End: 2015-07-04
  Administered 2015-07-03: 10 ug/kg/min via INTRAVENOUS
  Administered 2015-07-04: 40 ug/kg/min via INTRAVENOUS
  Administered 2015-07-04: 50 ug/kg/min via INTRAVENOUS
  Filled 2015-07-03 (×2): qty 100

## 2015-07-03 MED ORDER — PIPERACILLIN-TAZOBACTAM 3.375 G IVPB 30 MIN: 3.3750 g | Freq: Once | INTRAVENOUS | Status: DC

## 2015-07-03 MED ORDER — DEXTROSE 5 % IV SOLN: 500.0000 mg | Freq: Once | INTRAVENOUS | Status: DC

## 2015-07-03 MED ORDER — DEXTROSE 50 % IV SOLN
INTRAVENOUS | Status: AC
  Filled 2015-07-03: qty 50

## 2015-07-03 MED ORDER — PROPOFOL 1000 MG/100ML IV EMUL
INTRAVENOUS | Status: AC
  Filled 2015-07-03: qty 100

## 2015-07-03 MED ORDER — METHYLPREDNISOLONE SODIUM SUCC 125 MG IJ SOLR
125.0000 mg | Freq: Once | INTRAMUSCULAR | Status: AC
  Administered 2015-07-03: 125 mg via INTRAVENOUS
  Filled 2015-07-03: qty 2

## 2015-07-03 MED ORDER — METHYLPREDNISOLONE SODIUM SUCC 125 MG IJ SOLR: 80.0000 mg | Freq: Once | INTRAMUSCULAR | Status: DC

## 2015-07-03 MED ORDER — DEXTROSE 50 % IV SOLN
1.0000 | Freq: Once | INTRAVENOUS | Status: AC
  Administered 2015-07-03: 50 mL via INTRAVENOUS

## 2015-07-03 MED ORDER — SODIUM CHLORIDE 0.9 % IV SOLN
25.0000 ug/h | INTRAVENOUS | Status: DC
  Administered 2015-07-03: 100 ug/h via INTRAVENOUS
  Filled 2015-07-03 (×2): qty 50

## 2015-07-03 MED ORDER — SODIUM CHLORIDE 0.9 % IV BOLUS (SEPSIS): 1000.0000 mL | INTRAVENOUS | Status: DC

## 2015-07-03 MED ORDER — LORAZEPAM 2 MG/ML IJ SOLN
1.0000 mg | Freq: Once | INTRAMUSCULAR | Status: AC
  Administered 2015-07-03: 1 mg via INTRAVENOUS
  Filled 2015-07-03: qty 1

## 2015-07-03 MED ORDER — DEXTROSE 5 % IV SOLN: 1.0000 g | Freq: Once | INTRAVENOUS | Status: DC

## 2015-07-03 MED ORDER — OSELTAMIVIR PHOSPHATE 6 MG/ML PO SUSR
30.0000 mg | Freq: Every day | ORAL | Status: DC
  Administered 2015-07-03 – 2015-07-04 (×2): 30 mg
  Filled 2015-07-03 (×3): qty 5

## 2015-07-03 MED ORDER — ANTISEPTIC ORAL RINSE SOLUTION (CORINZ)
7.0000 mL | Freq: Four times a day (QID) | OROMUCOSAL | Status: DC
  Administered 2015-07-04 – 2015-07-05 (×4): 7 mL via OROMUCOSAL

## 2015-07-03 MED ORDER — CHLORHEXIDINE GLUCONATE 0.12% ORAL RINSE (MEDLINE KIT)
15.0000 mL | Freq: Two times a day (BID) | OROMUCOSAL | Status: DC
  Administered 2015-07-03 – 2015-07-05 (×4): 15 mL via OROMUCOSAL

## 2015-07-03 MED ORDER — IPRATROPIUM-ALBUTEROL 0.5-2.5 (3) MG/3ML IN SOLN
3.0000 mL | Freq: Four times a day (QID) | RESPIRATORY_TRACT | Status: DC
  Administered 2015-07-03 – 2015-07-04 (×4): 3 mL via RESPIRATORY_TRACT
  Filled 2015-07-03 (×5): qty 3

## 2015-07-03 MED ORDER — ACETAMINOPHEN 500 MG PO TABS: 1000.0000 mg | ORAL_TABLET | Freq: Once | ORAL | Status: DC

## 2015-07-03 MED ORDER — PROPOFOL 10 MG/ML IV BOLUS
10.0000 mg | Freq: Once | INTRAVENOUS | Status: AC
  Administered 2015-07-03: 10 mg via INTRAVENOUS

## 2015-07-03 MED ORDER — PIPERACILLIN-TAZOBACTAM IN DEX 2-0.25 GM/50ML IV SOLN
2.2500 g | Freq: Three times a day (TID) | INTRAVENOUS | Status: DC
  Administered 2015-07-03 – 2015-07-04 (×2): 2.25 g via INTRAVENOUS
  Filled 2015-07-03 (×6): qty 50

## 2015-07-03 MED ORDER — SENNOSIDES 8.8 MG/5ML PO SYRP: 5.0000 mL | ORAL_SOLUTION | Freq: Two times a day (BID) | ORAL | Status: DC | PRN

## 2015-07-03 MED ORDER — ALBUTEROL SULFATE (2.5 MG/3ML) 0.083% IN NEBU
2.5000 mg | INHALATION_SOLUTION | RESPIRATORY_TRACT | Status: DC | PRN
  Administered 2015-07-05 – 2015-07-07 (×6): 2.5 mg via RESPIRATORY_TRACT
  Filled 2015-07-03 (×6): qty 3

## 2015-07-03 MED ORDER — ALBUTEROL (5 MG/ML) CONTINUOUS INHALATION SOLN
10.0000 mg/h | INHALATION_SOLUTION | RESPIRATORY_TRACT | Status: DC
  Administered 2015-07-03: 10 mg/h via RESPIRATORY_TRACT
  Filled 2015-07-03: qty 20

## 2015-07-03 MED ORDER — MIDAZOLAM HCL 2 MG/2ML IJ SOLN
2.0000 mg | INTRAMUSCULAR | Status: DC | PRN
  Administered 2015-07-03: 2 mg via INTRAVENOUS
  Filled 2015-07-03: qty 2

## 2015-07-03 MED ORDER — SODIUM CHLORIDE 0.9 % IV BOLUS (SEPSIS)
500.0000 mL | INTRAVENOUS | Status: AC
  Administered 2015-07-03: 500 mL via INTRAVENOUS

## 2015-07-03 MED ORDER — SODIUM CHLORIDE 0.9 % IV SOLN
INTRAVENOUS | Status: DC
  Administered 2015-07-04: 15:00:00 via INTRAVENOUS

## 2015-07-03 MED ORDER — FENTANYL BOLUS VIA INFUSION
50.0000 ug | INTRAVENOUS | Status: DC | PRN
  Administered 2015-07-03 – 2015-07-04 (×4): 50 ug via INTRAVENOUS
  Filled 2015-07-03: qty 50

## 2015-07-03 MED ORDER — VANCOMYCIN HCL IN DEXTROSE 1-5 GM/200ML-% IV SOLN: 1000.0000 mg | Freq: Once | INTRAVENOUS | Status: DC

## 2015-07-03 MED ORDER — HEPARIN SODIUM (PORCINE) 5000 UNIT/ML IJ SOLN
5000.0000 [IU] | Freq: Three times a day (TID) | INTRAMUSCULAR | Status: DC
  Administered 2015-07-03 – 2015-07-08 (×12): 5000 [IU] via SUBCUTANEOUS
  Filled 2015-07-03 (×13): qty 1

## 2015-07-03 MED ORDER — SENNOSIDES 8.8 MG/5ML PO SYRP
5.0000 mL | ORAL_SOLUTION | Freq: Two times a day (BID) | ORAL | Status: DC | PRN
  Administered 2015-07-06: 5 mL
  Filled 2015-07-03 (×2): qty 5

## 2015-07-03 MED ORDER — FENTANYL CITRATE (PF) 100 MCG/2ML IJ SOLN
100.0000 ug | INTRAMUSCULAR | Status: DC | PRN
  Administered 2015-07-03: 100 ug via INTRAVENOUS
  Filled 2015-07-03: qty 2

## 2015-07-03 MED ORDER — VANCOMYCIN HCL 10 G IV SOLR
1500.0000 mg | Freq: Once | INTRAVENOUS | Status: AC
  Administered 2015-07-03: 1500 mg via INTRAVENOUS
  Filled 2015-07-03: qty 1500

## 2015-07-03 MED ORDER — PROPOFOL 10 MG/ML IV BOLUS
INTRAVENOUS | Status: AC
  Filled 2015-07-03: qty 20

## 2015-07-03 MED ORDER — PIPERACILLIN-TAZOBACTAM 3.375 G IVPB 30 MIN
3.3750 g | Freq: Once | INTRAVENOUS | Status: AC
  Administered 2015-07-03: 3.375 g via INTRAVENOUS
  Filled 2015-07-03: qty 50

## 2015-07-03 NOTE — ED Notes (Signed)
Intubated per ed res breath sounds bi-latera  7.5 tube  23 at lip

## 2015-07-03 NOTE — H&P (Signed)
PULMONARY / CRITICAL CARE MEDICINE   Name: Jennifer Lawson MRN: [id number] DOB: 1960-08-05    ADMISSION DATE:  07/03/2015 CONSULTATION DATE:  2/24  REFERRING MD:  Audie Pinto   CHIEF COMPLAINT:  Acute resp failure   HISTORY OF PRESENT ILLNESS:   This is a 55 year old female w/ h/o ESRD and O2 dependent COPD. Per EDP had been ill feeling for about a week. Had been exposed to sick grandson. Symptoms included: productive cough, nasal congestion, subjective fever and progressive dyspnea over a 4-5d period. Presented to the ED on 2/24 w/ fever >101, and O2 sats in 70s. Developed progressive agitation, hypoxia was refractory so was intubated by EDP. PCCM asked to admit.   PAST MEDICAL HISTORY :  She  has a past medical history of Mental disorder; Depression; Hypertension; Overdose; Tobacco use disorder (11/13/2012); Complication of anesthesia; Chronic kidney disease; Shortness of breath; PTSD (post-traumatic stress disorder); Asthma; COPD (chronic obstructive pulmonary disease) ([place]); Heart murmur; GERD (gastroesophageal reflux disease); Seizures ([place]); History of blood transfusion; and Diabetes mellitus without complication ([place]).  PAST SURGICAL HISTORY: She  has past surgical history that includes Right knee replacement; Esophagogastroduodenoscopy (Left, 11/14/2012); Joint replacement (Right); Parathyroidectomy; and AV fistula placement (Right, 06/12/2013).  Allergies  Allergen Reactions  . Codeine Sulfate Anaphylaxis    Daughter called about having this allergy   . Gabapentin Other (See Comments) and Anaphylaxis    seizures  . Haldol [Haloperidol Lactate] Shortness Of Breath  . Risperidone And Related Shortness Of Breath  . Trazodone And Nefazodone Other (See Comments)    Makes pt lose balance and fall  . Depakote [Divalproex Sodium] Other (See Comments)    Nose bleeds-currently taking medication  . Haloperidol Other (See Comments)    hallucinations  . Invega [Paliperidone Er] Nausea And  Vomiting  . Risperidone Other (See Comments)    Hallucinations?  Marland Kitchen Vistaril [Hydroxyzine Hcl] Nausea And Vomiting  . Hydrocodone-Acetaminophen Itching    No current facility-administered medications on file prior to encounter.   Current Outpatient Prescriptions on File Prior to Encounter  Medication Sig  . albuterol (PROVENTIL HFA;VENTOLIN HFA) 108 (90 Base) MCG/ACT inhaler Inhale 2 puffs into the lungs every 6 (six) hours as needed for wheezing or shortness of breath.  Marland Kitchen albuterol (PROVENTIL) (2.5 MG/3ML) 0.083% nebulizer solution Take 2.5 mg by nebulization every 6 (six) hours as needed for wheezing or shortness of breath.  Marland Kitchen b complex-vitamin c-folic acid (NEPHRO-VITE) 0.8 MG TABS tablet Take 1 tablet by mouth daily.  . cinacalcet (SENSIPAR) 60 MG tablet Take 60 mg by mouth daily. Takes with largest meal once daily  . divalproex (DEPAKOTE ER) 500 MG 24 hr tablet Take 2 tablets (1,000 mg total) by mouth at bedtime.  . divalproex (DEPAKOTE ER) 500 MG 24 hr tablet Take 1 tablet (500 mg total) by mouth every morning.  Mariane Baumgarten Sodium (DSS) 100 MG CAPS Take 100 mg by mouth 2 (two) times daily.  Marland Kitchen doxepin (SINEQUAN) 25 MG capsule Take 1 capsule (25 mg total) by mouth at bedtime.  . hydrocortisone (ANUSOL-HC) 2.5 % rectal cream Place rectally 2 (two) times daily.  Marland Kitchen LORazepam (ATIVAN) 0.5 MG tablet Take 0.5 mg by mouth every 8 (eight) hours as needed for anxiety or sleep.   . metoprolol tartrate (LOPRESSOR) 25 MG tablet Take 1 tablet (25 mg total) by mouth 2 (two) times daily.  . mometasone-formoterol (DULERA) 100-5 MCG/ACT AERO Inhale 2 puffs into the lungs 2 (two) times daily.  . pantoprazole (PROTONIX)  40 MG tablet Take 1 tablet (40 mg total) by mouth daily.  . sevelamer carbonate (RENVELA) 800 MG tablet Take 1,600 mg by mouth 3 (three) times daily with meals. Takes 2 tabs with each meal and snacks as directed  . simvastatin (ZOCOR) 10 MG tablet Take 1 tablet (10 mg total) by mouth at  bedtime. Reported on 05/30/2015  . ziprasidone (GEODON) 60 MG capsule Take 1 capsule (60 mg total) by mouth 2 (two) times daily with a meal. For mood control  . bisacodyl (DULCOLAX) 5 MG EC tablet Take 1 tablet (5 mg total) by mouth daily as needed for moderate constipation.    FAMILY HISTORY:  Her indicated that her mother is alive. She indicated that her father is deceased.   SOCIAL HISTORY: She  reports that she has been smoking Cigarettes and Cigars.  She has a 80 pack-year smoking history. She has never used smokeless tobacco. She reports that she does not drink alcohol or use illicit drugs.  REVIEW OF SYSTEMS:   Unable   SUBJECTIVE:  Sedated on vent   VITAL SIGNS: BP 132/76 mmHg  Pulse 117  Temp(Src) 100 F (37.8 C) (Rectal)  Resp 31  Ht 5\' 6"  (1.676 m)  SpO2 94%  LMP 05/09/2008  HEMODYNAMICS:    VENTILATOR SETTINGS: Vent Mode:  [-] PRVC FiO2 (%):  [45 %-100 %] 100 % Set Rate:  [18 bmp] 18 bmp Vt Set:  [480 mL] 480 mL PEEP:  [5 cmH20] 5 cmH20 Plateau Pressure:  [21 cmH20] 21 cmH20  INTAKE / OUTPUT:    PHYSICAL EXAMINATION: General:  Acutely ill appearing female, now sedated on vent  Neuro:  Sedated, was awake and oriented no focal motor def  HEENT:  Orally intubated. Pupils equal, clear nasal d/c Cardiovascular:  Tachy rrr, no MRG Lungs:  Diffuse scattered rhonchi + tactile frem Abdomen:  Soft, not tender. + bowel sounds Musculoskeletal:  Equal st and bulk Skin:  Warm, brisk CR, 2 + pulse   LABS:  BMET  Recent Labs Lab 07/03/15 1430  NA 142  K 4.5  CL 102  CO2 28  BUN 23*  CREATININE 3.88*  GLUCOSE 73    Electrolytes  Recent Labs Lab 07/03/15 1430  CALCIUM 9.4    CBC  Recent Labs Lab 07/03/15 1430  WBC 4.8  HGB 11.0*  HCT 37.6  PLT 124*    Coag's No results for input(s): APTT, INR in the last 168 hours.  Sepsis Markers  Recent Labs Lab 07/03/15 1448  LATICACIDVEN 0.83    ABG No results for input(s): PHART, PCO2ART,  PO2ART in the last 168 hours.  Liver Enzymes  Recent Labs Lab 07/03/15 1430  AST 19  ALT 9*  ALKPHOS 113  BILITOT 0.5  ALBUMIN 2.6*    Cardiac Enzymes No results for input(s): TROPONINI, PROBNP in the last 168 hours.  Glucose No results for input(s): GLUCAP in the last 168 hours.  Imaging Dg Chest 2 View  07/03/2015  CLINICAL DATA:  Shortness of breath today, smoker, cough, congestion, hypertension, COPD, chronic kidney disease, diabetes mellitus EXAM: CHEST  2 VIEW COMPARISON:  06/09/2005 FINDINGS: Enlargement of cardiac silhouette with pulmonary vascular congestion. Calcification and tortuosity of thoracic aorta. Increased markings in the mid to lower lungs bilaterally likely reflects subtle infiltrate, favor edema over infection. Minimal basilar atelectasis. No gross pleural effusion or pneumothorax. Bones unremarkable. IMPRESSION: Enlargement of cardiac silhouette with pulmonary vascular congestion and probable subtle infiltrates in the mid to lower lungs, favor  mild edema. Electronically Signed   By: Lavonia Dana M.D.   On: 07/03/2015 15:01     STUDIES:    CULTURES: Sputum 2/24>>> BCX 2 2/24>>> Flu PCR 2/24>>>  ANTIBIOTICS: vanc 2/24>>> Zosyn 2/24>> tamiflu 2/24>>>  SIGNIFICANT EVENTS:   LINES/TUBES: oett 2/24>>>  DISCUSSION: 55 year old female w/ chronic resp failure in setting of COPD, also ESRD (HD T/W/S). Admitting w/ working dx of PNA and associated resp failure. Had viral exposure so concerned about Flu. Will admit to ICU, pan culture, ck influenza PCR, and cover for HCAP orgs as well as add tamiflu  ASSESSMENT / PLAN:  PULMONARY A: Acute hypoxic respiratory failure in setting of Pneumonia  HCAP vs flu H/o COPD P:   Full vent support Scheduled BDs PAD protocol See ID section  CARDIOVASCULAR A:  Sepsis  HTN P:  IVFs Hold antihypertensive for now See ID section   RENAL A:   ESRd (T/W/S) P:   Renal dose meds Nephrology notified    GASTROINTESTINAL A:   No acute  P:   PPI for SUP tubefeeds to start 2/25 of still vent dependent  HEMATOLOGIC A:   Anemia of chronic disease  Mild thrombocytopenia  P:  Trend cbc [employer] heparin  Transfuse for hgb <8  INFECTIOUS A:   PNA. High concern for Flu but also consider HCAP organism given ESRD P:   tamiflu @ 4mg  daily (dose adjusted for renal) HCAP coverage; vanc and zosyn (see above) Trend PCT  ENDOCRINE A:   No acute  P:   Trend glucose   NEUROLOGIC A:   Acute encephalopathy  H/o bipolar disease  P:   RASS goal: -2 PAD protocol Holding home meds for now   FAMILY  - Updates: pending   - Inter-disciplinary family meet or Palliative Care meeting due by: 3/4  Erick Colace ACNP-BC [place] Pager # [phone] OR # [phone] if no answer   07/03/2015, 4:50 PM

## 2015-07-03 NOTE — ED Notes (Signed)
Per EMS pt from home c/o ShOB. Fire sts patient SpO2- 70%/RA placed on NRB 98%/NRB- EMS took off NRB and within 5 min patient SpO2 dropped to 80%/RA sitting down. Patient alert and oriented x4. Pt endorses foul smelling urine, thirst and "feeling like crap" Lung sounds diminished throughout. EKG- ST- 120 bpm Temp- 102 F

## 2015-07-03 NOTE — ED Notes (Signed)
Report called  

## 2015-07-03 NOTE — ED Provider Notes (Signed)
CSN: [id number]     Arrival date & time 07/03/15  1257 History   First MD Initiated Contact with Patient 07/03/15 1259     Chief Complaint  Patient presents with  . Shortness of Breath     (Consider location/radiation/quality/duration/timing/severity/associated sxs/prior Treatment) HPI  Jennifer Lawson is a 55 y.o F with a pmhx of CKD on dialysis, COPD, DM who presents to the emergency department today with fever and shortness of breath. Patient states that for the last week she has had a cold and has become increasingly short of breath with a productive cough. Patient is on home 2 L of oxygen at baseline but has been requiring more the last several days. Patient also reports subjective fevers for the last 2 days as well as vomiting and diarrhea. Patient also states that she saw bright red blood on toilet paper when she wiped. This has been occurring over the last several months. Today patient has associated lower abdominal pain and right flank pain. Denies chest pain, syncope, paresthesias, blurry vision, neck pain, otalgia, sore throat.   Past Medical History  Diagnosis Date  . Mental disorder   . Depression   . Hypertension   . Overdose   . Tobacco use disorder 11/13/2012  . Complication of anesthesia     difficulty going to sleep  . Chronic kidney disease     06/11/13- not on dialysis  . Shortness of breath     lying down flat  . PTSD (post-traumatic stress disorder)   . Asthma   . COPD (chronic obstructive pulmonary disease) ([place])   . Heart murmur   . GERD (gastroesophageal reflux disease)   . Seizures ([place])     "passsed out"  . History of blood transfusion   . Diabetes mellitus without complication [facility])     denies   Past Surgical History  Procedure Laterality Date  . Right knee replacement      she says it was last year.  . Esophagogastroduodenoscopy Left 11/14/2012    Procedure: ESOPHAGOGASTRODUODENOSCOPY (EGD);  Surgeon: [name], MD;  Location: [facility];   Service: Endoscopy;  Laterality: Left;  . Joint replacement Right     knee  . Parathyroidectomy    . Av fistula placement Right 06/12/2013    Procedure: ARTERIOVENOUS (AV) FISTULA CREATION; RIGHT  BASILIC VEIN TRANSPOSITION with Intraoperative ultrasound;  Surgeon: Mal [name], MD;  Location: [facility] OR;  Service: Vascular;  Laterality: Right;   Family History  Problem Relation Age of Onset  . Diabetes Mother   . Hyperlipidemia Mother   . Hypertension Mother   . Diabetes Father   . Hypertension Father   . Hyperlipidemia Father    Social History  Substance Use Topics  . Smoking status: Current Every Day Smoker -- 2.00 packs/day for 40 years    Types: Cigarettes, Cigars  . Smokeless tobacco: Never Used  . Alcohol Use: No     Comment: none in over a month per daughter   OB History    No data available     Review of Systems  All other systems reviewed and are negative.     Allergies  Codeine sulfate; Gabapentin; Haldol; Risperidone and related; Trazodone and nefazodone; Depakote; Haloperidol; Invega; Risperidone; Vistaril; and Hydrocodone-acetaminophen  Home Medications   Prior to Admission medications   Medication Sig Start Date End Date Taking? Authorizing Provider  albuterol (PROVENTIL HFA;VENTOLIN HFA) 108 (90 Base) MCG/ACT inhaler Inhale 2 puffs into the lungs every 6 (six) hours as needed  for wheezing or shortness of breath. 06/08/15   Clovis Fredrickson, MD  albuterol (PROVENTIL) (2.5 MG/3ML) 0.083% nebulizer solution Take 2.5 mg by nebulization every 6 (six) hours as needed for wheezing or shortness of breath.    Historical Provider, MD  b complex-vitamin c-folic acid (NEPHRO-VITE) 0.8 MG TABS tablet Take 1 tablet by mouth daily.    Historical Provider, MD  bisacodyl (DULCOLAX) 5 MG EC tablet Take 1 tablet (5 mg total) by mouth daily as needed for moderate constipation. 06/08/15   Clovis Fredrickson, MD  cinacalcet (SENSIPAR) 60 MG tablet Take 60 mg by mouth daily.  Takes with largest meal once daily    Historical Provider, MD  divalproex (DEPAKOTE ER) 500 MG 24 hr tablet Take 2 tablets (1,000 mg total) by mouth at bedtime. 06/08/15   Clovis Fredrickson, MD  divalproex (DEPAKOTE ER) 500 MG 24 hr tablet Take 1 tablet (500 mg total) by mouth every morning. 06/09/15   Clovis Fredrickson, MD  Docusate Sodium (DSS) 100 MG CAPS Take 100 mg by mouth 2 (two) times daily. 06/08/15   Clovis Fredrickson, MD  doxepin (SINEQUAN) 25 MG capsule Take 1 capsule (25 mg total) by mouth at bedtime. 06/08/15   Clovis Fredrickson, MD  hydrocortisone (ANUSOL-HC) 2.5 % rectal cream Place rectally 2 (two) times daily. 06/08/15   Clovis Fredrickson, MD  LORazepam (ATIVAN) 0.5 MG tablet Take 0.5 mg by mouth every 8 (eight) hours as needed for anxiety or sleep.     Historical Provider, MD  metoprolol tartrate (LOPRESSOR) 25 MG tablet Take 1 tablet (25 mg total) by mouth 2 (two) times daily. 06/08/15   Clovis Fredrickson, MD  mometasone-formoterol (DULERA) 100-5 MCG/ACT AERO Inhale 2 puffs into the lungs 2 (two) times daily. 06/08/15   Clovis Fredrickson, MD  pantoprazole (PROTONIX) 40 MG tablet Take 1 tablet (40 mg total) by mouth daily. 06/08/15   Clovis Fredrickson, MD  sevelamer carbonate (RENVELA) 800 MG tablet Take 1,600 mg by mouth 3 (three) times daily with meals. Takes 2 tabs with each meal and snacks as directed    Historical Provider, MD  simvastatin (ZOCOR) 10 MG tablet Take 1 tablet (10 mg total) by mouth at bedtime. Reported on 05/30/2015 06/08/15   Clovis Fredrickson, MD  ziprasidone (GEODON) 60 MG capsule Take 1 capsule (60 mg total) by mouth 2 (two) times daily with a meal. For mood control 06/08/15   Jolanta B Pucilowska, MD   BP 141/94 mmHg  Pulse 112  Temp(Src) 99.6 F (37.6 C) (Oral)  Resp 22  SpO2 100%  LMP 05/09/2008 Physical Exam  Constitutional: She is oriented to person, place, and time. She appears well-developed and well-nourished. No distress.   HENT:  Head: Normocephalic and atraumatic.  Mouth/Throat: Oropharynx is clear and moist. No oropharyngeal exudate.  Eyes: Conjunctivae and EOM are normal. Pupils are equal, round, and reactive to light. Right eye exhibits no discharge. Left eye exhibits no discharge. No scleral icterus.  Neck: Neck supple.  Cardiovascular: Normal rate, regular rhythm, normal heart sounds and intact distal pulses.  Exam reveals no gallop and no friction rub.   No murmur heard. Pulmonary/Chest: She has no rales. She exhibits no tenderness.  Diminished breath sounds throughout. Accessory muscle usage present, breathing labored.  Abdominal: Soft. She exhibits no distension. There is tenderness ( mild RLQ abd TTP). There is no guarding.  No CVA tenderness. No peritoneal signs.   Musculoskeletal: Normal range of motion.  She exhibits no edema.  Neurological: She is alert and oriented to person, place, and time.  Strength 5/5 throughout. No sensory deficits.    Skin: Skin is warm and dry. No rash noted. She is not diaphoretic. No erythema. No pallor.  Psychiatric: She has a normal mood and affect. Her behavior is normal.  Nursing note and vitals reviewed.   ED Course  Procedures (including critical care time)  CRITICAL CARE Performed by: Carlos Levering   Total critical care time: 60 minutes  Critical care time was exclusive of separately billable procedures and treating other patients.  Critical care was necessary to treat or prevent imminent or life-threatening deterioration.  Critical care was time spent personally by me on the following activities: development of treatment plan with patient and/or surrogate as well as nursing, discussions with consultants, evaluation of patient's response to treatment, examination of patient, obtaining history from patient or surrogate, ordering and performing treatments and interventions, ordering and review of laboratory studies, ordering and review of  radiographic studies, pulse oximetry and re-evaluation of patient's condition.  Labs Review Labs Reviewed  COMPREHENSIVE METABOLIC PANEL - Abnormal; Notable for the following:    BUN 23 (*)    Creatinine, Ser 3.88 (*)    Albumin 2.6 (*)    ALT 9 (*)    GFR calc non Af Amer 12 (*)    GFR calc Af Amer 14 (*)    All other components within normal limits  CBC WITH DIFFERENTIAL/PLATELET - Abnormal; Notable for the following:    RBC 3.71 (*)    Hemoglobin 11.0 (*)    MCV 101.3 (*)    MCHC 29.3 (*)    RDW 19.9 (*)    Platelets 124 (*)    All other components within normal limits  I-STAT VENOUS BLOOD GAS, ED - Abnormal; Notable for the following:    pCO2, Ven 70.2 (*)    pO2, Ven 55.0 (*)    Bicarbonate 31.3 (*)    All other components within normal limits  CULTURE, BLOOD (ROUTINE X 2)  CULTURE, BLOOD (ROUTINE X 2)  URINE CULTURE  URINALYSIS, ROUTINE W REFLEX MICROSCOPIC (NOT AT [facility])  BLOOD GAS, VENOUS  I-STAT CG4 LACTIC ACID, ED    Imaging Review Dg Chest 2 View  07/03/2015  CLINICAL DATA:  Shortness of breath today, smoker, cough, congestion, hypertension, COPD, chronic kidney disease, diabetes mellitus EXAM: CHEST  2 VIEW COMPARISON:  06/09/2005 FINDINGS: Enlargement of cardiac silhouette with pulmonary vascular congestion. Calcification and tortuosity of thoracic aorta. Increased markings in the mid to lower lungs bilaterally likely reflects subtle infiltrate, favor edema over infection. Minimal basilar atelectasis. No gross pleural effusion or pneumothorax. Bones unremarkable. IMPRESSION: Enlargement of cardiac silhouette with pulmonary vascular congestion and probable subtle infiltrates in the mid to lower lungs, favor mild edema. Electronically Signed   By: Lavonia Dana M.D.   On: 07/03/2015 15:01   I have personally reviewed and evaluated these images and lab results as part of my medical decision-making.   EKG Interpretation None      MDM   Final diagnoses:  Sepsis,  due to unspecified organism [facility])  Right middle lobe pneumonia    55 year old female with past medical history of CK D on dialysis, COPD presents to the ED with fever and shortness of breath. On presentation patient is hypoxic to 80%, febrile with a temperature of 102 and tachycardic to 112. Patient has had a wet cough for the last 3 days. Suspect pulmonary  source of infection. Code sepsis was called based on positive SIRS criteria. Patient was placed on nonrebreather which improved oxygen saturation. Patient then given continuous albuterol nebulizer, Solu-Medrol and magnesium. Patient was fluid resuscitated per sepsis protocol and given IV Vancocin Zosyn. Blood cultures collected. On repeat lung exam patient still requiring accessory muscle usage. Patient now moving more air, wheezes heard in all lung fields. Chest x-ray was read as right middle lobe infiltrate, favor edema over infection. However given the patient is symptomatic with a cough and fever suspect pneumonia.   Patient was unable to sit still for an ABG. VBG obtained which reveals PCO2 at 70. Patient placed on BiPAP to improve breathing. Lactic acid within normal limits. No leukocytosis. K wnl. Cr elevated 3.88. This is improved from previous lab results. Pt had dialysis yesterday.   Patient is unimproved on BiPAP. Pt now unarousable. Labored breathing noted. Patient now requiring intubation. Intubation successful. Sputum cultures and influenza panel obtained. Will repeat ABG. Post intubation CXR reveals good tube placement. I spoke with critical care provider feel well consultation ED and admit to their service. Patient will require dialysis tomorrow.  Patient was discussed with and seen by Dr. Tyrone Nine who agrees with the treatment plan.      [place], PA-C 07/03/15 [place], DO 07/04/15 [phone]

## 2015-07-03 NOTE — ED Notes (Signed)
Etomidate 20mg  iv per [place] rn  Rocuronium 70 mg iv [place] rn

## 2015-07-03 NOTE — ED Provider Notes (Signed)
  Physical Exam  BP 132/76 mmHg  Pulse 117  Temp(Src) 100 F (37.8 C) (Rectal)  Resp 31  SpO2 94%  LMP 05/09/2008  Physical Exam  ED Course  .Intubation Date/Time: 07/03/2015 4:26 PM Performed by: Vira Blanco Authorized by: Leonard Schwartz Consent: The procedure was performed in an emergent situation. Indications: respiratory distress and  respiratory failure Intubation method: direct Patient status: paralyzed (RSI) Preoxygenation: Bipap. Sedatives: etomidate Paralytic: rocuronium Laryngoscope size: Mac 3 Tube size: 7.5 mm Tube type: cuffed Number of attempts: 1 Cords visualized: yes Post-procedure assessment: chest rise and CO2 detector Breath sounds: equal Cuff inflated: yes ETT to lip: 23 cm Tube secured with: ETT holder Chest x-ray interpreted by me and other physician. Chest x-ray findings: endotracheal tube in appropriate position Patient tolerance: Patient tolerated the procedure well with no immediate complications    Dialysis patient here for respiratory distress, hypoxemia.  Intubated by myself for airway protection/hypoxemia.    No issues with intubation.  Please refer to provider note for full details of ED stay.   Discussed with Dr. Audie Pinto.        Vira Blanco, MD 07/03/15 [id number]  Leonard Schwartz, MD 07/06/15 [phone]

## 2015-07-03 NOTE — Consult Note (Signed)
Reason for Consult: To manage dialysis and dialysis related needs Referring Physician: [name] is an 54 y.o. female with past medical history significant for hypertension, COPD-oxygen dependent, diabetes mellitus, psych disorder not otherwise specified and also end-stage renal disease on dialysis Tuesday, Thursday, Saturday at [facility]. She presented for dialysis on both Wednesday and Thursday this week. She is intubated so unable to provide history. She presents to the ER today stating that she been ill for about a week. She been exposed to a sick grandson. She had productive cough and subjective fever as well as progressive shortness of breath. During her course in the emergency department she developed worsening hypoxia and agitation so was intubated. Chest x-ray is consistent with infection versus edema- but she was noted to get closer to her dry weight than she usually does yesterday.    Dialyzes at [place] 83 kg- got to 84 kg yesterday. HD Bath 2K/2 calcium, Dialyzer 180, Heparin no. Access AV fistula right upper arm. Hectorol 3 g every treatment also on Sensipar 60 and Renvela 1600 3 times a day last calcium 8, phosphorus 5.1, PTH 1739 Received Mircera on 2/22 last hemoglobin 10.5  Past Medical History  Diagnosis Date  . Mental disorder   . Depression   . Hypertension   . Overdose   . Tobacco use disorder 11/13/2012  . Complication of anesthesia     difficulty going to sleep  . Chronic kidney disease     06/11/13- not on dialysis  . Shortness of breath     lying down flat  . PTSD (post-traumatic stress disorder)   . Asthma   . COPD (chronic obstructive pulmonary disease) ([place])   . Heart murmur   . GERD (gastroesophageal reflux disease)   . Seizures ([place])     "passsed out"  . History of blood transfusion   . Diabetes mellitus without complication [facility])     denies    Past Surgical History  Procedure Laterality Date  . Right knee replacement       she says it was last year.  . Esophagogastroduodenoscopy Left 11/14/2012    Procedure: ESOPHAGOGASTRODUODENOSCOPY (EGD);  Surgeon: Jennifer Craver, MD;  Location: [facility] ENDOSCOPY;  Service: Endoscopy;  Laterality: Left;  . Joint replacement Right     knee  . Parathyroidectomy    . Av fistula placement Right 06/12/2013    Procedure: ARTERIOVENOUS (AV) FISTULA CREATION; RIGHT  BASILIC VEIN TRANSPOSITION with Intraoperative ultrasound;  Surgeon: Jennifer [name], MD;  Location: [facility] OR;  Service: Vascular;  Laterality: Right;    Family History  Problem Relation Age of Onset  . Diabetes Mother   . Hyperlipidemia Mother   . Hypertension Mother   . Diabetes Father   . Hypertension Father   . Hyperlipidemia Father     Social History:  reports that she has been smoking Cigarettes and Cigars.  She has a 80 pack-year smoking history. She has never used smokeless tobacco. She reports that she does not drink alcohol or use illicit drugs.  Allergies:  Allergies  Allergen Reactions  . Codeine Sulfate Anaphylaxis    Daughter called about having this allergy   . Gabapentin Other (See Comments) and Anaphylaxis    seizures  . Haldol [Haloperidol Lactate] Shortness Of Breath  . Risperidone And Related Shortness Of Breath  . Trazodone And Nefazodone Other (See Comments)    Makes pt lose balance and fall  . Depakote [Divalproex Sodium] Other (See Comments)  Nose bleeds-currently taking medication  . Haloperidol Other (See Comments)    hallucinations  . Invega [Paliperidone Er] Nausea And Vomiting  . Risperidone Other (See Comments)    Hallucinations?  Marland Kitchen Vistaril [Hydroxyzine Hcl] Nausea And Vomiting  . Hydrocodone-Acetaminophen Itching    Medications: I have reviewed the patient's current medications.   Results for orders placed or performed during the hospital encounter of 07/03/15 (from the past 48 hour(s))  Comprehensive metabolic panel     Status: Abnormal   Collection Time: 07/03/15  2:30 PM   Result Value Ref Range   Sodium 142 135 - 145 mmol/L   Potassium 4.5 3.5 - 5.1 mmol/L   Chloride 102 101 - 111 mmol/L   CO2 28 22 - 32 mmol/L   Glucose, Bld 73 65 - 99 mg/dL   BUN 23 (H) 6 - 20 mg/dL   Creatinine, Ser 3.88 (H) 0.44 - 1.00 mg/dL   Calcium 9.4 8.9 - 10.3 mg/dL   Total Protein 6.9 6.5 - 8.1 g/dL   Albumin 2.6 (L) 3.5 - 5.0 g/dL   AST 19 15 - 41 U/L   ALT 9 (L) 14 - 54 U/L   Alkaline Phosphatase 113 38 - 126 U/L   Total Bilirubin 0.5 0.3 - 1.2 mg/dL   GFR calc non Af Amer 12 (L) >60 mL/min   GFR calc Af Amer 14 (L) >60 mL/min    Comment: (NOTE) The eGFR has been calculated using the CKD EPI equation. This calculation has not been validated in all clinical situations. eGFR's persistently <60 mL/min signify possible Chronic Kidney Disease.    Anion gap 12 5 - 15  CBC WITH DIFFERENTIAL     Status: Abnormal   Collection Time: 07/03/15  2:30 PM  Result Value Ref Range   WBC 4.8 4.0 - 10.5 K/uL   RBC 3.71 (L) 3.87 - 5.11 MIL/uL   Hemoglobin 11.0 (L) 12.0 - 15.0 g/dL   HCT 37.6 36.0 - 46.0 %   MCV 101.3 (H) 78.0 - 100.0 fL   MCH 29.6 26.0 - 34.0 pg   MCHC 29.3 (L) 30.0 - 36.0 g/dL   RDW 19.9 (H) 11.5 - 15.5 %   Platelets 124 (L) 150 - 400 K/uL   Neutrophils Relative % 69 %   Neutro Abs 3.2 1.7 - 7.7 K/uL   Lymphocytes Relative 20 %   Lymphs Abs 1.0 0.7 - 4.0 K/uL   Monocytes Relative 11 %   Monocytes Absolute 0.5 0.1 - 1.0 K/uL   Eosinophils Relative 0 %   Eosinophils Absolute 0.0 0.0 - 0.7 K/uL   Basophils Relative 0 %   Basophils Absolute 0.0 0.0 - 0.1 K/uL  Blood gas, venous     Status: None   Collection Time: 07/03/15  2:36 PM  Result Value Ref Range   FIO2 TEST WILL BE CREDITED    O2 Content TEST WILL BE CREDITED L/min   Delivery systems TEST WILL BE CREDITED    Mode TEST WILL BE CREDITED    VT TEST WILL BE CREDITED mL   LHR TEST WILL BE CREDITED resp/min   Hi Frequency JET Vent Rate TEST WILL BE CREDITED    Peep/cpap TEST WILL BE CREDITED cm H20    PIP TEST WILL BE CREDITED cm H2O   Hi Frequency JET Vent PIP TEST WILL BE CREDITED    Pressure support TEST WILL BE CREDITED cm H20   Pressure control TEST WILL BE CREDITED cm H20   pH, Ven  TEST WILL BE CREDITED 7.250 - 7.300   pCO2, Ven TEST WILL BE CREDITED 45.0 - 50.0 mmHg   pO2, Ven TEST WILL BE CREDITED 30.0 - 45.0 mmHg   Bicarbonate TEST WILL BE CREDITED 20.0 - 24.0 mEq/L   TCO2 TEST WILL BE CREDITED 0 - 100 mmol/L   Acid-Base Excess TEST WILL BE CREDITED 0.0 - 2.0 mmol/L   Acid-base deficit TEST WILL BE CREDITED 0.0 - 2.0 mmol/L   O2 Saturation TEST WILL BE CREDITED %   Patient temperature TEST WILL BE CREDITED    Amplitude TEST WILL BE CREDITED    Map TEST WILL BE CREDITED cmH20   Hertz TEST WILL BE CREDITED    Nitric Oxide TEST WILL BE CREDITED    Collection site TEST WILL BE CREDITED    Drawn by TEST WILL BE CREDITED    Sample type TEST WILL BE CREDITED    Mechanical Rate TEST WILL BE CREDITED   I-Stat venous blood gas, ED     Status: Abnormal   Collection Time: 07/03/15  2:47 PM  Result Value Ref Range   pH, Ven 7.258 7.250 - 7.300   pCO2, Ven 70.2 (HH) 45.0 - 50.0 mmHg   pO2, Ven 55.0 (H) 30.0 - 45.0 mmHg   Bicarbonate 31.3 (H) 20.0 - 24.0 mEq/L   TCO2 33 0 - 100 mmol/L   O2 Saturation 82.0 %   Acid-Base Excess 2.0 0.0 - 2.0 mmol/L   Patient temperature HIDE    Sample type VENOUS    Comment NOTIFIED PHYSICIAN   I-Stat CG4 Lactic Acid, ED  (not at  [facility])     Status: None   Collection Time: 07/03/15  2:48 PM  Result Value Ref Range   Lactic Acid, Venous 0.83 0.5 - 2.0 mmol/L  I-Stat arterial blood gas, ED     Status: Abnormal   Collection Time: 07/03/15  4:59 PM  Result Value Ref Range   pH, Arterial 7.291 (L) 7.350 - 7.450   pCO2 arterial 62.5 (HH) 35.0 - 45.0 mmHg   pO2, Arterial 220.0 (H) 80.0 - 100.0 mmHg   Bicarbonate 30.1 (H) 20.0 - 24.0 mEq/L   TCO2 32 0 - 100 mmol/L   O2 Saturation 100.0 %   Acid-Base Excess 2.0 0.0 - 2.0 mmol/L   Patient  temperature HIDE    Sample type ARTERIAL    Comment NOTIFIED PHYSICIAN     Dg Chest 2 View  07/03/2015  CLINICAL DATA:  Shortness of breath today, smoker, cough, congestion, hypertension, COPD, chronic kidney disease, diabetes mellitus EXAM: CHEST  2 VIEW COMPARISON:  06/09/2005 FINDINGS: Enlargement of cardiac silhouette with pulmonary vascular congestion. Calcification and tortuosity of thoracic aorta. Increased markings in the mid to lower lungs bilaterally likely reflects subtle infiltrate, favor edema over infection. Minimal basilar atelectasis. No gross pleural effusion or pneumothorax. Bones unremarkable. IMPRESSION: Enlargement of cardiac silhouette with pulmonary vascular congestion and probable subtle infiltrates in the mid to lower lungs, favor mild edema. Electronically Signed   By: Lavonia Dana M.D.   On: 07/03/2015 15:01   Dg Chest Portable 1 View  07/03/2015  CLINICAL DATA:  Intubation. EXAM: PORTABLE CHEST 1 VIEW COMPARISON:  Earlier the same day FINDINGS: 1640 hours. Endotracheal tube tip is in the right mainstem bronchus. Endotracheal tube could be pulled back approximately 2.5 cm for more appropriate positioning. The NG tube passes into the stomach although the distal tip position is not included on the film. The cardio pericardial silhouette is enlarged.  Bibasilar airspace disease is more prominent now. The visualized bony structures of the thorax are intact. Telemetry leads overlie the chest. IMPRESSION: 1. Right mainstem intubation. Endotracheal tube could be pulled back about 2.5 cm for more appropriate positioning. 2. Interval increase in bibasilar airspace disease raising concern for progressive edema given the timeframe. Superimposed infection remains a consideration. Results were called directly to Dr. Audie Pinto at approximately 1700 hours on 07/03/2015. Electronically Signed   By: Lawson Stanley M.D.   On: 07/03/2015 17:08    ROS: Unable to be obtained as patient is  intubated Blood pressure 132/76, pulse 117, temperature 100 F (37.8 C), temperature source Rectal, resp. rate 31, height _0  (1.676 m), last menstrual period 05/09/2008, SpO2 94 %. General appearance: alert, appears older than stated age and Sedated on vent Neck: no adenopathy, no carotid bruit, no JVD, supple, symmetrical, trachea midline and thyroid not enlarged, symmetric, no tenderness/mass/nodules Resp: rhonchi bilaterally Cardio: regular rate and rhythm, S1, S2 normal, no murmur, click, rub or gallop GI: soft, non-tender; bowel sounds normal; no masses,  no organomegaly Extremities: extremities normal, atraumatic, no cyanosis or edema Right upper arm AV fistula with good thrill and bruit  Assessment/Plan: 55 year old black female with ESRD who presents with a pulmonary infection type history with productive cough and fever whose symptoms escalated in the emergency department requiring intubation for hypoxia and hypercarbia 1 pulmonary- history suggestive of an infectious etiology. Progressive symptoms requiring intubation today. Plans per CCM- steroids, Zosyn, vancomycin as well as Tamiflu 2 ESRD: Normally Tuesday, Thursday, Saturday at [place] via AVF. Would be due for HD tomorrow. We'll evaluate her tomorrow for stability/dialysis need 3 Hypertension: Actually not an issue right now. That and the fact that she doesn't really have peripheral edema makes me think that this is not pulmonary edema 4. Anemia of ESRD: Fairly well controlled. Received Mircera on 2/22 5. Metabolic Bone Disease: Once able to take by mouth's will put her back on her Sensipar and Renvela 6. Psych history- will likely become an issue once extubated   Lorik Guo A 07/03/2015, 5:22 PM

## 2015-07-03 NOTE — Progress Notes (Signed)
Pharmacy Antibiotic Note Jennifer Lawson is a 55 y.o. female admitted on 07/03/2015 with pneumonia. Pharmacy has been consulted for Zosyn and vancomycin dosing.  Plan: 1. Vancomycin 1500 mg loading dose x 1 now; will f/u on HD plans and schedule subsequent doses accordingly 2. Zosyn 3.375 grams to be given in ER; start 2.25 grams IV q 8 hours after wards 3. VT at [facility] if continued    Temp (24hrs), Avg:99.6 F (37.6 C), Min:99.6 F (37.6 C), Max:99.6 F (37.6 C)  No results for input(s): WBC, CREATININE, LATICACIDVEN, VANCOTROUGH, VANCOPEAK, VANCORANDOM, GENTTROUGH, GENTPEAK, GENTRANDOM, TOBRATROUGH, TOBRAPEAK, TOBRARND, AMIKACINPEAK, AMIKACINTROU, AMIKACIN in the last 168 hours.  CrCl cannot be calculated (Unknown ideal weight.).    Allergies  Allergen Reactions  . Codeine Sulfate Anaphylaxis    Daughter called about having this allergy   . Gabapentin Other (See Comments) and Anaphylaxis    seizures  . Haldol [Haloperidol Lactate] Shortness Of Breath  . Risperidone And Related Shortness Of Breath  . Trazodone And Nefazodone Other (See Comments)    Makes pt lose balance and fall  . Depakote [Divalproex Sodium] Other (See Comments)    Nose bleeds-currently taking medication  . Haloperidol Other (See Comments)    hallucinations  . Invega [Paliperidone Er] Nausea And Vomiting  . Risperidone Other (See Comments)    Hallucinations?  Marland Kitchen Vistaril [Hydroxyzine Hcl] Nausea And Vomiting  . Hydrocodone-Acetaminophen Itching    Antimicrobials this admission: 2/24 Zosyn >>  2/24 Vancomycin >>   Dose adjustments this admission: N/a  Microbiology results: px  Thank you for allowing pharmacy to be a part of this patient's care.  Duayne Cal 07/03/2015 2:28 PM

## 2015-07-04 ENCOUNTER — Inpatient Hospital Stay (HOSPITAL_COMMUNITY): Payer: Medicare Other

## 2015-07-04 DIAGNOSIS — A419 Sepsis, unspecified organism: Principal | ICD-10-CM

## 2015-07-04 LAB — GLUCOSE, CAPILLARY
Glucose-Capillary: 74 mg/dL (ref 65–99)
Glucose-Capillary: 76 mg/dL (ref 65–99)
Glucose-Capillary: 78 mg/dL (ref 65–99)
Glucose-Capillary: 85 mg/dL (ref 65–99)
Glucose-Capillary: 88 mg/dL (ref 65–99)
Glucose-Capillary: 95 mg/dL (ref 65–99)

## 2015-07-04 LAB — RENAL FUNCTION PANEL
Albumin: 2.3 g/dL — ABNORMAL LOW (ref 3.5–5.0)
Anion gap: 16 — ABNORMAL HIGH (ref 5–15)
BUN: 33 mg/dL — ABNORMAL HIGH (ref 6–20)
CO2: 22 mmol/L (ref 22–32)
Calcium: 9.4 mg/dL (ref 8.9–10.3)
Chloride: 103 mmol/L (ref 101–111)
Creatinine, Ser: 4.42 mg/dL — ABNORMAL HIGH (ref 0.44–1.00)
GFR calc Af Amer: 12 mL/min — ABNORMAL LOW (ref >60)
GFR calc non Af Amer: 10 mL/min — ABNORMAL LOW (ref >60)
Glucose, Bld: 86 mg/dL (ref 65–99)
Phosphorus: 5.4 mg/dL — ABNORMAL HIGH (ref 2.5–4.6)
Potassium: 4.3 mmol/L (ref 3.5–5.1)
Sodium: 141 mmol/L (ref 135–145)

## 2015-07-04 LAB — POCT I-STAT 3, ART BLOOD GAS (G3+)
Acid-Base Excess: 2 mmol/L (ref 0.0–2.0)
Bicarbonate: 27.4 mEq/L — ABNORMAL HIGH (ref 20.0–24.0)
O2 Saturation: 94 %
TCO2: 29 mmol/L (ref 0–100)
pCO2 arterial: 47.6 mmHg — ABNORMAL HIGH (ref 35.0–45.0)
pH, Arterial: 7.368 (ref 7.350–7.450)
pO2, Arterial: 73 mmHg — ABNORMAL LOW (ref 80.0–100.0)

## 2015-07-04 LAB — CBC
HCT: 33.1 % — ABNORMAL LOW (ref 36.0–46.0)
Hemoglobin: 9.8 g/dL — ABNORMAL LOW (ref 12.0–15.0)
MCH: 29.3 pg (ref 26.0–34.0)
MCHC: 29.6 g/dL — ABNORMAL LOW (ref 30.0–36.0)
MCV: 99.1 fL (ref 78.0–100.0)
Platelets: 127 10*3/uL — ABNORMAL LOW (ref 150–400)
RBC: 3.34 MIL/uL — ABNORMAL LOW (ref 3.87–5.11)
RDW: 20.3 % — ABNORMAL HIGH (ref 11.5–15.5)
WBC: 3.7 10*3/uL — ABNORMAL LOW (ref 4.0–10.5)

## 2015-07-04 LAB — PROCALCITONIN: Procalcitonin: 0.38 ng/mL

## 2015-07-04 MED ORDER — IPRATROPIUM-ALBUTEROL 0.5-2.5 (3) MG/3ML IN SOLN
3.0000 mL | RESPIRATORY_TRACT | Status: DC | PRN
  Filled 2015-07-04: qty 3

## 2015-07-04 MED ORDER — LIDOCAINE HCL (PF) 1 % IJ SOLN: 5.0000 mL | INTRAMUSCULAR | Status: DC | PRN

## 2015-07-04 MED ORDER — LORAZEPAM 0.5 MG PO TABS
0.5000 mg | ORAL_TABLET | Freq: Once | ORAL | Status: AC
  Administered 2015-07-04: 0.5 mg via ORAL
  Filled 2015-07-04: qty 1

## 2015-07-04 MED ORDER — ZIPRASIDONE HCL 60 MG PO CAPS
60.0000 mg | ORAL_CAPSULE | Freq: Two times a day (BID) | ORAL | Status: DC
  Administered 2015-07-04 – 2015-07-08 (×8): 60 mg via ORAL
  Filled 2015-07-04 (×11): qty 1

## 2015-07-04 MED ORDER — ALTEPLASE 2 MG IJ SOLR: 2.0000 mg | Freq: Once | INTRAMUSCULAR | Status: DC | PRN

## 2015-07-04 MED ORDER — VANCOMYCIN HCL IN DEXTROSE 1-5 GM/200ML-% IV SOLN
1000.0000 mg | Freq: Once | INTRAVENOUS | Status: AC
  Administered 2015-07-04: 1000 mg via INTRAVENOUS
  Filled 2015-07-04: qty 200

## 2015-07-04 MED ORDER — DIVALPROEX SODIUM ER 500 MG PO TB24
500.0000 mg | ORAL_TABLET | Freq: Every morning | ORAL | Status: DC
  Administered 2015-07-04 – 2015-07-08 (×4): 500 mg via ORAL
  Filled 2015-07-04 (×4): qty 1

## 2015-07-04 MED ORDER — PENTAFLUOROPROP-TETRAFLUOROETH EX AERO: 1.0000 "application " | INHALATION_SPRAY | CUTANEOUS | Status: DC | PRN

## 2015-07-04 MED ORDER — LIDOCAINE-PRILOCAINE 2.5-2.5 % EX CREA: 1.0000 "application " | TOPICAL_CREAM | CUTANEOUS | Status: DC | PRN

## 2015-07-04 MED ORDER — DIVALPROEX SODIUM ER 500 MG PO TB24
1000.0000 mg | ORAL_TABLET | Freq: Every day | ORAL | Status: DC
  Administered 2015-07-04 – 2015-07-07 (×4): 1000 mg via ORAL
  Filled 2015-07-04 (×5): qty 2

## 2015-07-04 MED ORDER — VANCOMYCIN HCL IN DEXTROSE 1-5 GM/200ML-% IV SOLN: 1000.0000 mg | INTRAVENOUS | Status: DC

## 2015-07-04 MED ORDER — ACETAMINOPHEN 325 MG PO TABS
650.0000 mg | ORAL_TABLET | Freq: Four times a day (QID) | ORAL | Status: DC | PRN
  Administered 2015-07-04 – 2015-07-07 (×4): 650 mg via ORAL
  Filled 2015-07-04 (×4): qty 2

## 2015-07-04 MED ORDER — SODIUM CHLORIDE 0.9 % IV SOLN: 100.0000 mL | INTRAVENOUS | Status: DC | PRN

## 2015-07-04 MED ORDER — PIPERACILLIN-TAZOBACTAM IN DEX 2-0.25 GM/50ML IV SOLN
2.2500 g | Freq: Three times a day (TID) | INTRAVENOUS | Status: DC
  Administered 2015-07-04 – 2015-07-07 (×8): 2.25 g via INTRAVENOUS
  Filled 2015-07-04 (×15): qty 50

## 2015-07-04 MED ORDER — DOXEPIN HCL 25 MG PO CAPS
25.0000 mg | ORAL_CAPSULE | Freq: Every day | ORAL | Status: DC
  Administered 2015-07-04 – 2015-07-07 (×4): 25 mg via ORAL
  Filled 2015-07-04 (×5): qty 1

## 2015-07-04 MED ORDER — MOMETASONE FURO-FORMOTEROL FUM 200-5 MCG/ACT IN AERO
2.0000 | INHALATION_SPRAY | Freq: Two times a day (BID) | RESPIRATORY_TRACT | Status: DC
  Administered 2015-07-04 – 2015-07-08 (×6): 2 via RESPIRATORY_TRACT
  Filled 2015-07-04 (×2): qty 8.8

## 2015-07-04 MED ORDER — IPRATROPIUM-ALBUTEROL 0.5-2.5 (3) MG/3ML IN SOLN
3.0000 mL | Freq: Three times a day (TID) | RESPIRATORY_TRACT | Status: DC
  Administered 2015-07-05 (×2): 3 mL via RESPIRATORY_TRACT
  Filled 2015-07-04 (×2): qty 3

## 2015-07-04 NOTE — Progress Notes (Signed)
Subjective:  Low blood pressures overnight although last 2 have been over 100- no pressors/on sedation- labs pending this AM Objective Vital signs in last 24 hours: Filed Vitals:   07/04/15 0445 07/04/15 0500 07/04/15 0515 07/04/15 0530  BP: 90/52 116/87 120/89 108/66  Pulse: 94 100 102 96  Temp:      TempSrc:      Resp: 18 25 16 20   Height:      Weight:  85.3 kg (188 lb 0.8 oz)    SpO2: 95% 99% 98% 95%   Weight change:   Intake/Output Summary (Last 24 hours) at 07/04/15 0646 Last data filed at 07/04/15 0500  Gross per 24 hour  Intake 810.48 ml  Output      0 ml  Net 810.48 ml    Dialyzes at [place] 83 kg- got to 84 kg PTA HD Bath 2K/2 calcium, Dialyzer 180, Heparin no. Access AV fistula right upper arm. Hectorol 3 g every treatment also on Sensipar 60 and Renvela 1600 3 times a day last calcium 8, phosphorus 5.1, PTH 1739 Received Mircera on 2/22 last hemoglobin 10.5   Assessment/Plan: 55 year old black female with ESRD who presents with a pulmonary infection type history with productive cough and fever whose symptoms escalated in the emergency department requiring intubation for hypoxia and hypercarbia 1 pulmonary- history suggestive of an infectious etiology. Progressive symptoms requiring intubation today. Plans per CCM- steroids, Zosyn, vancomycin as well as Tamiflu 2 ESRD: Normally TTS at [place] via AVF. Would be due for HD today. Will await labs from this AM and likely do in the next 24 to 36 hours based on the availability of nursing to do a separate one on one treatment 3 Hypertension: Actually not an issue right now. That and the fact that she doesn't really have peripheral edema makes me think that this is not pulmonary edema 4. Anemia of ESRD: Fairly well controlled. Received Mircera on 2/22 5. Metabolic Bone Disease: Once able to take by mouth's will put her back on her Sensipar and Renvela 6. Psych history- will likely become an issue once  extubated     Kashmir Leedy A    Labs: Basic Metabolic Panel:  Recent Labs Lab 07/03/15 1430  NA 142  K 4.5  CL 102  CO2 28  GLUCOSE 73  BUN 23*  CREATININE 3.88*  CALCIUM 9.4   Liver Function Tests:  Recent Labs Lab 07/03/15 1430  AST 19  ALT 9*  ALKPHOS 113  BILITOT 0.5  PROT 6.9  ALBUMIN 2.6*   No results for input(s): LIPASE, AMYLASE in the last 168 hours. No results for input(s): AMMONIA in the last 168 hours. CBC:  Recent Labs Lab 07/03/15 1430  WBC 4.8  NEUTROABS 3.2  HGB 11.0*  HCT 37.6  MCV 101.3*  PLT 124*   Cardiac Enzymes: No results for input(s): CKTOTAL, CKMB, CKMBINDEX, TROPONINI in the last 168 hours. CBG:  Recent Labs Lab 07/03/15 1925 07/03/15 1955 07/03/15 2359 07/04/15 0408  GLUCAP 68 151* 95 78    Iron Studies: No results for input(s): IRON, TIBC, TRANSFERRIN, FERRITIN in the last 72 hours. Studies/Results: Dg Chest 2 View  07/03/2015  CLINICAL DATA:  Shortness of breath today, smoker, cough, congestion, hypertension, COPD, chronic kidney disease, diabetes mellitus EXAM: CHEST  2 VIEW COMPARISON:  06/09/2005 FINDINGS: Enlargement of cardiac silhouette with pulmonary vascular congestion. Calcification and tortuosity of thoracic aorta. Increased markings in the mid to lower lungs bilaterally likely reflects subtle infiltrate, favor edema over  infection. Minimal basilar atelectasis. No gross pleural effusion or pneumothorax. Bones unremarkable. IMPRESSION: Enlargement of cardiac silhouette with pulmonary vascular congestion and probable subtle infiltrates in the mid to lower lungs, favor mild edema. Electronically Signed   By: Lavonia Dana M.D.   On: 07/03/2015 15:01   Dg Chest Portable 1 View  07/03/2015  CLINICAL DATA:  Intubation. EXAM: PORTABLE CHEST 1 VIEW COMPARISON:  Earlier the same day FINDINGS: 1640 hours. Endotracheal tube tip is in the right mainstem bronchus. Endotracheal tube could be pulled back approximately  2.5 cm for more appropriate positioning. The NG tube passes into the stomach although the distal tip position is not included on the film. The cardio pericardial silhouette is enlarged. Bibasilar airspace disease is more prominent now. The visualized bony structures of the thorax are intact. Telemetry leads overlie the chest. IMPRESSION: 1. Right mainstem intubation. Endotracheal tube could be pulled back about 2.5 cm for more appropriate positioning. 2. Interval increase in bibasilar airspace disease raising concern for progressive edema given the timeframe. Superimposed infection remains a consideration. Results were called directly to Dr. Audie Pinto at approximately 1700 hours on 07/03/2015. Electronically Signed   By: [name] Stanley M.D.   On: 07/03/2015 17:08   Medications: Infusions: . sodium chloride 50 mL/hr at 07/04/15 0400  . fentaNYL infusion INTRAVENOUS 50 mcg/hr (07/04/15 0500)  . propofol (DIPRIVAN) infusion 50 mcg/kg/min (07/04/15 0530)    Scheduled Medications: . antiseptic oral rinse  7 mL Mouth Rinse QID  . chlorhexidine gluconate  15 mL Mouth Rinse BID  . heparin subcutaneous  5,000 Units Subcutaneous 3 times per day  . ipratropium-albuterol  3 mL Nebulization Q6H  . oseltamivir  30 mg Per Tube Daily  . pantoprazole (PROTONIX) IV  40 mg Intravenous Q24H  . piperacillin-tazobactam (ZOSYN)  IV  2.25 g Intravenous 3 times per day    have reviewed scheduled and prn medications.  Physical Exam: General: sedated but did arouse- on vent Heart: RRR Lungs: CBS bilat Abdomen: soft, non tender Extremities: now a little edema Dialysis Access: right upper arm AVF    07/04/2015,6:46 AM  LOS: 1 day

## 2015-07-04 NOTE — Progress Notes (Signed)
Dialysis treatment completed.  3000 mL ultrafiltrated and net fluid removal 2500 mL.    Patient status unchanged. Lung sounds diminished to ausculation in all fields. No edema. Cardiac: Regular R&R, some PVC.  Disconnected lines and removed needles.  Pressure held for 10 minutes and band aid/gauze dressing applied.  Report given to bedside RN, Lattie Haw.

## 2015-07-04 NOTE — Progress Notes (Signed)
Wasted 50 ml of fentanyl drip down sink. Witnessed by Maia Petties, RN. Residual propofol 50 ml wasted in black box.

## 2015-07-04 NOTE — Progress Notes (Signed)
[facility]-Link notified of pt's negative flu PCR, droplet precautions d/c'd per Dr. Jimmy Footman.  Currently titrating sedation gtt for appropriate BP and RASS.  Will continue to monitor.  Henreitta Leber, RN 1:58 AM 07/04/2015

## 2015-07-04 NOTE — Progress Notes (Signed)
PULMONARY / CRITICAL CARE MEDICINE   Name: Jennifer Lawson MRN: [id number] DOB: [birth year]    ADMISSION DATE:  07/03/2015 CONSULTATION DATE:  2/24  REFERRING MD:  Audie Pinto   CHIEF COMPLAINT:  Acute resp failure   HISTORY OF PRESENT ILLNESS:   This is a 55 year old female w/ h/o ESRD and O2 dependent COPD. Per EDP had been ill feeling for about a week. Had been exposed to sick grandson. Symptoms included: productive cough, nasal congestion, subjective fever and progressive dyspnea over a 4-5d period. Presented to the ED on 2/24 w/ fever >101, and O2 sats in 70s. Developed progressive agitation, hypoxia was refractory so was intubated by EDP. PCCM asked to admit.   SUBJECTIVE:  Awake, passed SBT   VITAL SIGNS: BP 93/57 mmHg  Pulse 84  Temp(Src) 98.4 F (36.9 C) (Axillary)  Resp 21  Ht 5\' 6"  (1.676 m)  Wt 188 lb 0.8 oz (85.3 kg)  BMI 30.37 kg/m2  SpO2 95%  LMP 05/09/2008  HEMODYNAMICS:    VENTILATOR SETTINGS: Vent Mode:  [-] PRVC FiO2 (%):  [40 %-100 %] 40 % Set Rate:  [18 bmp] 18 bmp Vt Set:  [480 mL] 480 mL PEEP:  [5 cmH20] 5 cmH20 Pressure Support:  [5 cmH20] 5 cmH20 Plateau Pressure:  [10 S192499 cmH20] 10 cmH20  INTAKE / OUTPUT: I/O last 3 completed shifts: In: 968.7 [I.V.:888.7; NG/GT:30; IV Piggyback:50] Out: -   PHYSICAL EXAMINATION: General: awake, follows commands. Passed SBT Neuro:  Awake, oriented, no focal def.  HEENT:  Orally intubated. Pupils equal, clear nasal d/c-->this has improved Cardiovascular:   rrr, no MRG Lungs:  Scattered rhonchi, no wheeze.  Abdomen:  Soft, not tender. + bowel sounds Musculoskeletal:  Equal st and bulk Skin:  Warm, brisk CR, 2 + pulse   LABS:  BMET  Recent Labs Lab 07/03/15 1430 07/04/15 0534  NA 142 141  K 4.5 4.3  CL 102 103  CO2 28 22  BUN 23* 33*  CREATININE 3.88* 4.42*  GLUCOSE 73 86    Electrolytes  Recent Labs Lab 07/03/15 1430 07/04/15 0534  CALCIUM 9.4 9.4  PHOS  --  5.4*     CBC  Recent Labs Lab 07/03/15 1430 07/04/15 0534  WBC 4.8 3.7*  HGB 11.0* 9.8*  HCT 37.6 33.1*  PLT 124* 127*    Coag's No results for input(s): APTT, INR in the last 168 hours.  Sepsis Markers  Recent Labs Lab 07/03/15 1448 07/03/15 2010 07/04/15 0534  LATICACIDVEN 0.83  --   --   PROCALCITON  --  0.46 0.38    ABG  Recent Labs Lab 07/03/15 1659 07/04/15 0626  PHART 7.291* 7.368  PCO2ART 62.5* 47.6*  PO2ART 220.0* 73.0*    Liver Enzymes  Recent Labs Lab 07/03/15 1430 07/04/15 0534  AST 19  --   ALT 9*  --   ALKPHOS 113  --   BILITOT 0.5  --   ALBUMIN 2.6* 2.3*    Cardiac Enzymes No results for input(s): TROPONINI, PROBNP in the last 168 hours.  Glucose  Recent Labs Lab 07/03/15 1925 07/03/15 1955 07/03/15 2359 07/04/15 0408 07/04/15 0927  GLUCAP 68 151* 95 78 76    Imaging Dg Chest 2 View  07/03/2015  CLINICAL DATA:  Shortness of breath today, smoker, cough, congestion, hypertension, COPD, chronic kidney disease, diabetes mellitus EXAM: CHEST  2 VIEW COMPARISON:  06/09/2005 FINDINGS: Enlargement of cardiac silhouette with pulmonary vascular congestion. Calcification and tortuosity of thoracic aorta. Increased  markings in the mid to lower lungs bilaterally likely reflects subtle infiltrate, favor edema over infection. Minimal basilar atelectasis. No gross pleural effusion or pneumothorax. Bones unremarkable. IMPRESSION: Enlargement of cardiac silhouette with pulmonary vascular congestion and probable subtle infiltrates in the mid to lower lungs, favor mild edema. Electronically Signed   By: Lavonia Dana M.D.   On: 07/03/2015 15:01   Dg Chest Port 1 View  07/04/2015  CLINICAL DATA:  Acute respiratory failure EXAM: PORTABLE CHEST 1 VIEW COMPARISON:  07/03/2015 FINDINGS: Endotracheal tube tip 2.1 cm above the carina. Nasogastric tube enters the stomach. Mild enlargement of the cardiopericardial silhouette with indistinct pulmonary vasculature.  Mild atherosclerotic calcification of the aortic arch. Linear subsegmental atelectasis in the left mid lung. Indistinctness of part of the left hemidiaphragm could reflect mild left lower lobe atelectasis. Mildly improved opacity medially at the right lung base. IMPRESSION: 1. The endotracheal tube has been retracted and is in a satisfactorily position with tip 2.1 cm above the carina. 2. Subtle basilar and left mid lung opacities. 3. Mild enlargement of the cardiopericardial silhouette. Electronically Signed   By: Van Clines M.D.   On: 07/04/2015 08:37   Dg Chest Portable 1 View  07/03/2015  CLINICAL DATA:  Intubation. EXAM: PORTABLE CHEST 1 VIEW COMPARISON:  Earlier the same day FINDINGS: 1640 hours. Endotracheal tube tip is in the right mainstem bronchus. Endotracheal tube could be pulled back approximately 2.5 cm for more appropriate positioning. The NG tube passes into the stomach although the distal tip position is not included on the film. The cardio pericardial silhouette is enlarged. Bibasilar airspace disease is more prominent now. The visualized bony structures of the thorax are intact. Telemetry leads overlie the chest. IMPRESSION: 1. Right mainstem intubation. Endotracheal tube could be pulled back about 2.5 cm for more appropriate positioning. 2. Interval increase in bibasilar airspace disease raising concern for progressive edema given the timeframe. Superimposed infection remains a consideration. Results were called directly to Dr. Audie Pinto at approximately 1700 hours on 07/03/2015. Electronically Signed   By: [name] Stanley M.D.   On: 07/03/2015 17:08  PCXR: ETT ok, improved aeration specifically on the right. Still some faint basilar airspace disease  STUDIES:   CULTURES: Sputum 2/24>>> BCX 2 2/24>>> Flu PCR 2/24>>>neg   ANTIBIOTICS: vanc 2/24>>> Zosyn 2/24>> tamiflu 2/24>>>2/25 SIGNIFICANT EVENTS:   LINES/TUBES: oett 2/24>>>  DISCUSSION: 55 year old female w/ chronic  resp failure in setting of COPD, also ESRD (HD T/W/S). Admitting w/ working dx of PNA and associated resp failure. Had viral exposure so concerned about Flu; but this was negative. Passed SBT today. Extubated, looks good. Will f/u on pending cultures, and cont HCAP coverage. Focus on pulm hygiene at this point    ASSESSMENT / PLAN:  PULMONARY A: Acute hypoxic respiratory failure in setting of Pneumonia  HCAP vs flu H/o COPD ->CXR improved. Passed SBT.  P:   Extubate Resume dulera Add IS/flutter and mobilize  Wean FIO2 as needed   CARDIOVASCULAR A:  Sepsis  HTN--> at baseline Hypotension r/t diprivan  P:  IVFs Hold antihypertensive for now Dc sedating meds   RENAL A:   ESRd (T/W/S) P:   Renal dose meds Nephrology notified   GASTROINTESTINAL A:   No acute  P:   Adv diet as tolerated   HEMATOLOGIC A:   Anemia of chronic disease  Mild thrombocytopenia  P:  Trend cbc [place] heparin  Transfuse for hgb <8  INFECTIOUS A:   PNA (NOS to date)  Influenza PCR neg P:   Dc tamiflu  HCAP coverage; vanc and zosyn (see above) Trend PCT  ENDOCRINE A:   hypoglycemia P:   Adv diet   NEUROLOGIC A:   Acute encephalopathy  H/o bipolar disease  -->appears much improved. Writing notes. Asking appropriate questions P:   RASS goal: 0 D/c sedating meds Resume home meds for bipolar dz   FAMILY  - Updates: pending   - Inter-disciplinary family meet or Palliative Care meeting due by: 3/4  Erick Colace ACNP-BC [place] Pager # [phone] OR # [phone] if no answer   07/04/2015, 11:18 AM

## 2015-07-04 NOTE — Progress Notes (Signed)
Pharmacy Antibiotic Note Jennifer Lawson is a 55 y.o. female admitted on 07/03/2015 with r/o pneumonia. Pharmacy has been consulted for Zosyn and vancomycin/zosyn dosing. ESRD on HD TTS - getting HD on schedule this afternoon. Given 1500mg  load in the ED. Will order maintenance doses.  Afeb, wbc 3.7, pct trend down to 0.38, LA 0.83.  Plan: Continue Vanc 1g IV qHD-TTS Zosyn 2.25g IV q8h Monitor clinical progress, c/s, abx plan/LOT Vanc levels as indicated F/u HD schedule/toleration inpatient  Height: 5\' 6"  (167.6 cm) Weight: 191 lb 9.3 oz (86.9 kg) IBW/kg (Calculated) : 59.3  Temp (24hrs), Avg:99.1 F (37.3 C), Min:98.4 F (36.9 C), Max:99.7 F (37.6 C)   Recent Labs Lab 07/03/15 1430 07/03/15 1448 07/04/15 0534  WBC 4.8  --  3.7*  CREATININE 3.88*  --  4.42*  LATICACIDVEN  --  0.83  --     Estimated Creatinine Clearance: 16 mL/min (by C-G formula based on Cr of 4.42).    Allergies  Allergen Reactions  . Codeine Sulfate Anaphylaxis    Daughter called about having this allergy   . Gabapentin Other (See Comments) and Anaphylaxis    seizures  . Haldol [Haloperidol Lactate] Shortness Of Breath  . Risperidone And Related Shortness Of Breath  . Trazodone And Nefazodone Other (See Comments)    Makes pt lose balance and fall  . Depakote [Divalproex Sodium] Other (See Comments)    Nose bleeds-currently taking medication  . Haloperidol Other (See Comments)    hallucinations  . Invega [Paliperidone Er] Nausea And Vomiting  . Risperidone Other (See Comments)    Hallucinations?  Marland Kitchen Vistaril [Hydroxyzine Hcl] Nausea And Vomiting  . Hydrocodone-Acetaminophen Itching    Not allergic to aceetaminophen    Antimicrobials this admission: 2/24 vanc>> 2/24 zosyn>> 2/24 tamiflu>>2/25  Dose adjustments this admission: N/a  Microbiology results: 2/24 sputum>> 2/24 BCx2>>ngtd 2/24 flu pcr>>neg 2/24 MRSA pcr: neg  Elicia Lamp, PharmD, [facility] Clinical Pharmacist Pager  [phone] 07/04/2015 5:34 PM

## 2015-07-04 NOTE — Progress Notes (Signed)
Arrived to patient room at 1446.  Chloramines in RO H2O less than 0.02  Reviewed treatment plan and this RN agrees.  Report received from bedside RN, Lattie Haw.  Consent obtained.  Patient A & O X 4, anxious. Lung sounds clear to ausculation in all fields. Generalized edema. Cardiac: Regular R&R, tachycardic at times.  Prepped RUAVF with alcohol and cannulated with two 15 gauge needles.  Pulsation of blood noted.  Flushed access well with saline per protocol.  Connected and secured lines and initiated tx at 1533.  UF goal of 3000 mL and net fluid removal of 2500 mL.  Will continue to monitor.

## 2015-07-05 LAB — GLUCOSE, CAPILLARY
Glucose-Capillary: 124 mg/dL — ABNORMAL HIGH (ref 65–99)
Glucose-Capillary: 130 mg/dL — ABNORMAL HIGH (ref 65–99)
Glucose-Capillary: 139 mg/dL — ABNORMAL HIGH (ref 65–99)
Glucose-Capillary: 163 mg/dL — ABNORMAL HIGH (ref 65–99)
Glucose-Capillary: 64 mg/dL — ABNORMAL LOW (ref 65–99)
Glucose-Capillary: 77 mg/dL (ref 65–99)
Glucose-Capillary: 82 mg/dL (ref 65–99)

## 2015-07-05 LAB — PROCALCITONIN: Procalcitonin: 0.57 ng/mL

## 2015-07-05 MED ORDER — NICOTINE 21 MG/24HR TD PT24
21.0000 mg | MEDICATED_PATCH | Freq: Every day | TRANSDERMAL | Status: DC
  Administered 2015-07-06: 21 mg via TRANSDERMAL
  Filled 2015-07-05: qty 1

## 2015-07-05 MED ORDER — CINACALCET HCL 30 MG PO TABS
60.0000 mg | ORAL_TABLET | Freq: Every day | ORAL | Status: DC
  Administered 2015-07-05 – 2015-07-07 (×3): 60 mg via ORAL
  Filled 2015-07-05 (×3): qty 2

## 2015-07-05 MED ORDER — SEVELAMER CARBONATE 800 MG PO TABS
1600.0000 mg | ORAL_TABLET | Freq: Three times a day (TID) | ORAL | Status: DC
  Administered 2015-07-05 – 2015-07-08 (×9): 1600 mg via ORAL
  Filled 2015-07-05 (×10): qty 2

## 2015-07-05 MED ORDER — METHYLPREDNISOLONE SODIUM SUCC 40 MG IJ SOLR
40.0000 mg | Freq: Two times a day (BID) | INTRAMUSCULAR | Status: AC
  Administered 2015-07-05 – 2015-07-06 (×4): 40 mg via INTRAVENOUS
  Filled 2015-07-05 (×5): qty 1

## 2015-07-05 MED ORDER — METHYLPREDNISOLONE SODIUM SUCC 125 MG IJ SOLR: 40.0000 mg | Freq: Two times a day (BID) | INTRAMUSCULAR | Status: DC

## 2015-07-05 MED ORDER — KETOROLAC TROMETHAMINE 10 MG PO TABS
10.0000 mg | ORAL_TABLET | Freq: Once | ORAL | Status: AC
  Administered 2015-07-05: 10 mg via ORAL
  Filled 2015-07-05: qty 1

## 2015-07-05 NOTE — Progress Notes (Addendum)
Utilization review completed.  

## 2015-07-05 NOTE — Progress Notes (Signed)
PULMONARY / CRITICAL CARE MEDICINE   Name: Jennifer Lawson MRN: [id number] DOB: 1960/10/18    ADMISSION DATE:  07/03/2015 CONSULTATION DATE:  2/24  REFERRING MD:  Audie Pinto   CHIEF COMPLAINT:  Acute resp failure   HISTORY OF PRESENT ILLNESS:   This is a 55 year old female w/ h/o ESRD and O2 dependent COPD. Per EDP had been ill feeling for about a week. Had been exposed to sick grandson. Symptoms included: productive cough, nasal congestion, subjective fever and progressive dyspnea over a 4-5d period. Presented to the ED on 2/24 w/ fever >101, and O2 sats in 70s. Developed progressive agitation, hypoxia was refractory so was intubated by EDP. PCCM asked to admit.   SUBJECTIVE:  Awake, no distress, but still O2 dependent   VITAL SIGNS: BP 113/73 mmHg  Pulse 92  Temp(Src) 99.3 F (37.4 C) (Oral)  Resp 22  Ht 5\' 6"  (1.676 m)  Wt 180 lb 12.4 oz (82 kg)  BMI 29.19 kg/m2  SpO2 93%  LMP 05/09/2008 3 liters  INTAKE / OUTPUT: I/O last 3 completed shifts: In: 2531.8 [P.O.:770; I.V.:1341.8; NG/GT:20; IV Piggyback:400] Out: 2500 [Other:2500]  PHYSICAL EXAMINATION: General: awake, follows commands. No distress  Neuro:  Awake, oriented, no focal def.  HEENT:   Pupils equal, clear nasal d/c; speaks in soft hoarse whisper only. Has loud upper airway wheeze Cardiovascular:   rrr, no MRG Lungs:  occ rhonchi, exp wheeze Abdomen:  Soft, not tender. + bowel sounds Musculoskeletal:  Equal st and bulk Skin:  Warm, brisk CR, 2 + pulse   LABS:  BMET  Recent Labs Lab 07/03/15 1430 07/04/15 0534  NA 142 141  K 4.5 4.3  CL 102 103  CO2 28 22  BUN 23* 33*  CREATININE 3.88* 4.42*  GLUCOSE 73 86    Electrolytes  Recent Labs Lab 07/03/15 1430 07/04/15 0534  CALCIUM 9.4 9.4  PHOS  --  5.4*    CBC  Recent Labs Lab 07/03/15 1430 07/04/15 0534  WBC 4.8 3.7*  HGB 11.0* 9.8*  HCT 37.6 33.1*  PLT 124* 127*    Coag's No results for input(s): APTT, INR in the last 168  hours.  Sepsis Markers  Recent Labs Lab 07/03/15 1448 07/03/15 2010 07/04/15 0534 07/05/15 0310  LATICACIDVEN 0.83  --   --   --   PROCALCITON  --  0.46 0.38 0.57    ABG  Recent Labs Lab 07/03/15 1659 07/04/15 0626  PHART 7.291* 7.368  PCO2ART 62.5* 47.6*  PO2ART 220.0* 73.0*    Liver Enzymes  Recent Labs Lab 07/03/15 1430 07/04/15 0534  AST 19  --   ALT 9*  --   ALKPHOS 113  --   BILITOT 0.5  --   ALBUMIN 2.6* 2.3*    Cardiac Enzymes No results for input(s): TROPONINI, PROBNP in the last 168 hours.  Glucose  Recent Labs Lab 07/04/15 0927 07/04/15 1215 07/04/15 1628 07/04/15 1932 07/04/15 2331 07/05/15 0325  GLUCAP 76 74 88 85 139* 77    Imaging No results found.PCXR: ETT ok, improved aeration specifically on the right. Still some faint basilar airspace disease  STUDIES:   CULTURES: Sputum 2/24>>> BCX 2 2/24>>> Flu PCR 2/24>>>neg   ANTIBIOTICS: vanc 2/24>>>2/25 Zosyn 2/24>> tamiflu 2/24>>>2/25 SIGNIFICANT EVENTS:   LINES/TUBES: oett 2/24>>>2/25  ASSESSMENT / PLAN: Resolved Issues Acute encephalopathy   Sepsis  Current problem list  Acute hypoxic respiratory failure in setting of Pneumonia (NOS) Mild AECOPD Possible laryngeal edema  ->extubated 2/26.  Weaning O2.  ->wheezing a little more today (2/26). This seems to be a mix of both upper and lower airway. Her phonation quality is weak. Not sure to what extent this is her underlying psych d/o and to what extent this may represent mild laryngeal edema.  Plan:   Narrow abx (will d/c vanc today) If cont to improve clinically could be changed to Augmentin 2/26-->w/ plan to complete 10d Cont Dulera  Cont flutter AND IS Add pred taper Keep ICU for now given deconditioning   Deconditioning/cough Plan SLP eval; ensure not aspirating    HTN--> at baseline Plan:  IVFs Hold antihypertensive for now  ESRd (T/W/S) ->HD last 2/25 Plan:   Renal dose meds  Anemia of chronic  disease  Mild thrombocytopenia  Plan:  Trend cbc [place] heparin  Transfuse for hgb <8  H/o bipolar disease  Plan   home meds for bipolar dz   DISCUSSION: 55 year old female w/ chronic resp failure in setting of COPD, also ESRD (HD T/W/S). Admitting w/ working dx of PNA and associated resp failure. Had viral exposure so concerned about Flu; but this was negative. Extubated. Tol HD 2/25. Wheezing a little more on 2/26 but seems to be a mix of both lower and upper airway disease. Given poor phonation quality I am concerned about mild laryngeal edema. She is ready for transfer to med/surg: will add pulse steroids, narrow abx, mobilize, adv diet, and cont pulm hygiene measures. Cont to improve as of 2/27 she can be narrowed to Augmentin. If her voice quality does not improve in next 48 hrs will need ENT eval. Will keep her here for now given deconditioning and poor cough mechanics.   Erick Colace ACNP-BC [place] Pager # [phone] OR # [phone] if no answer    07/05/2015, 8:07 AM

## 2015-07-05 NOTE — Progress Notes (Signed)
Nursing concerned about cough w/ POs Sleepy but wakes up quickly. No distress but still wheezing   Plan Will keep her in the ICU for now.  Get SLP eval All other rx as outlined in prior note.   Erick Colace ACNP-BC [place] Pager # [phone] OR # [phone] if no answer

## 2015-07-05 NOTE — Progress Notes (Signed)
Subjective:  HD yest- removed 2500- then able to be extubated - now on [place]- BP is good weight today 82 kg (EDW is 83) Objective Vital signs in last 24 hours: Filed Vitals:   07/05/15 0400 07/05/15 0500 07/05/15 0600 07/05/15 0700  BP: 108/58 117/68 112/68 113/73  Pulse:  88 89 92  Temp:      TempSrc:      Resp: 23 22 21 22   Height:      Weight:  82 kg (180 lb 12.4 oz)    SpO2: 96% 94% 94% 93%   Weight change: 2.4 kg (5 lb 4.7 oz)  Intake/Output Summary (Last 24 hours) at 07/05/15 0800 Last data filed at 07/05/15 0700  Gross per 24 hour  Intake 1580.03 ml  Output   2500 ml  Net -919.97 ml    Dialyzes at [place] 83 kg- got to 84 kg PTA HD [place] 2K/2 calcium, Dialyzer 180, Heparin no. Access AV fistula right upper arm. Hectorol 3 g every treatment also on Sensipar 60 and Renvela 1600 3 times a day last calcium 8, phosphorus 5.1, PTH 1739 Received Mircera on 2/22 last hemoglobin 10.5   Assessment/Plan: 55 year old black female with ESRD who presents with a pulmonary infection type history with productive cough and fever whose symptoms escalated in the emergency department requiring intubation for hypoxia and hypercarbia 1 pulmonary- history suggestive of an infectious etiology. Progressive symptoms requiring intubation. Plans per CCM- steroids, Zosyn, vancomycin as well as Tamiflu- extubated on 2/25 evening- doing much better in this regard  2 ESRD: Normally TTS at [place] via AVF. S/p HD last night- needed benzo but hemodynamically tolerated well- plan for nest tx on Tuesday  3 Hypertension: Actually not an issue right now. Under EDW 4. Anemia of ESRD: Fairly well controlled. Received Mircera on 2/22 5. Metabolic Bone Disease: resume  her Sensipar and Renvela 6. Psych history- will likely become an issue once extubated     [facility]: Basic Metabolic Panel:  Recent Labs Lab 07/03/15 1430 07/04/15 0534  NA 142 141  K 4.5 4.3  CL 102 103  CO2 28  22  GLUCOSE 73 86  BUN 23* 33*  CREATININE 3.88* 4.42*  CALCIUM 9.4 9.4  PHOS  --  5.4*   Liver Function Tests:  Recent Labs Lab 07/03/15 1430 07/04/15 0534  AST 19  --   ALT 9*  --   ALKPHOS 113  --   BILITOT 0.5  --   PROT 6.9  --   ALBUMIN 2.6* 2.3*   No results for input(s): LIPASE, AMYLASE in the last 168 hours. No results for input(s): AMMONIA in the last 168 hours. CBC:  Recent Labs Lab 07/03/15 1430 07/04/15 0534  WBC 4.8 3.7*  NEUTROABS 3.2  --   HGB 11.0* 9.8*  HCT 37.6 33.1*  MCV 101.3* 99.1  PLT 124* 127*   Cardiac Enzymes: No results for input(s): CKTOTAL, CKMB, CKMBINDEX, TROPONINI in the last 168 hours. CBG:  Recent Labs Lab 07/04/15 1215 07/04/15 1628 07/04/15 1932 07/04/15 2331 07/05/15 0325  GLUCAP 74 88 85 139* 77    Iron Studies: No results for input(s): IRON, TIBC, TRANSFERRIN, FERRITIN in the last 72 hours. Studies/Results: Dg Chest 2 View  07/03/2015  CLINICAL DATA:  Shortness of breath today, smoker, cough, congestion, hypertension, COPD, chronic kidney disease, diabetes mellitus EXAM: CHEST  2 VIEW COMPARISON:  06/09/2005 FINDINGS: Enlargement of cardiac silhouette with pulmonary vascular congestion. Calcification and tortuosity of  thoracic aorta. Increased markings in the mid to lower lungs bilaterally likely reflects subtle infiltrate, favor edema over infection. Minimal basilar atelectasis. No gross pleural effusion or pneumothorax. Bones unremarkable. IMPRESSION: Enlargement of cardiac silhouette with pulmonary vascular congestion and probable subtle infiltrates in the mid to lower lungs, favor mild edema. Electronically Signed   By: Lavonia Dana M.D.   On: 07/03/2015 15:01   Dg Chest Port 1 View  07/04/2015  CLINICAL DATA:  Acute respiratory failure EXAM: PORTABLE CHEST 1 VIEW COMPARISON:  07/03/2015 FINDINGS: Endotracheal tube tip 2.1 cm above the carina. Nasogastric tube enters the stomach. Mild enlargement of the cardiopericardial  silhouette with indistinct pulmonary vasculature. Mild atherosclerotic calcification of the aortic arch. Linear subsegmental atelectasis in the left mid lung. Indistinctness of part of the left hemidiaphragm could reflect mild left lower lobe atelectasis. Mildly improved opacity medially at the right lung base. IMPRESSION: 1. The endotracheal tube has been retracted and is in a satisfactorily position with tip 2.1 cm above the carina. 2. Subtle basilar and left mid lung opacities. 3. Mild enlargement of the cardiopericardial silhouette. Electronically Signed   By: Van Clines M.D.   On: 07/04/2015 08:37   Dg Chest Portable 1 View  07/03/2015  CLINICAL DATA:  Intubation. EXAM: PORTABLE CHEST 1 VIEW COMPARISON:  Earlier the same day FINDINGS: 1640 hours. Endotracheal tube tip is in the right mainstem bronchus. Endotracheal tube could be pulled back approximately 2.5 cm for more appropriate positioning. The NG tube passes into the stomach although the distal tip position is not included on the film. The cardio pericardial silhouette is enlarged. Bibasilar airspace disease is more prominent now. The visualized bony structures of the thorax are intact. Telemetry leads overlie the chest. IMPRESSION: 1. Right mainstem intubation. Endotracheal tube could be pulled back about 2.5 cm for more appropriate positioning. 2. Interval increase in bibasilar airspace disease raising concern for progressive edema given the timeframe. Superimposed infection remains a consideration. Results were called directly to Dr. Audie Pinto at approximately 1700 hours on 07/03/2015. Electronically Signed   By: [name] Stanley M.D.   On: 07/03/2015 17:08   Medications: Infusions: . sodium chloride 10 mL/hr at 07/04/15 1457    Scheduled Medications: . antiseptic oral rinse  7 mL Mouth Rinse QID  . chlorhexidine gluconate  15 mL Mouth Rinse BID  . divalproex  1,000 mg Oral QHS  . divalproex  500 mg Oral q morning - 10a  . doxepin  25  mg Oral QHS  . heparin subcutaneous  5,000 Units Subcutaneous 3 times per day  . ipratropium-albuterol  3 mL Nebulization TID  . mometasone-formoterol  2 puff Inhalation BID  . pantoprazole (PROTONIX) IV  40 mg Intravenous Q24H  . piperacillin-tazobactam (ZOSYN)  IV  2.25 g Intravenous 3 times per day  . [START ON 07/07/2015] vancomycin  1,000 mg Intravenous Q T,Th,Sa-HD  . ziprasidone  60 mg Oral BID WC    have reviewed scheduled and prn medications.  Physical Exam: General: awake- wants coffee and breakfast  Heart: RRR Lungs: CBS bilat Abdomen: soft, non tender Extremities: minimal  edema Dialysis Access: right upper arm AVF- patent     07/05/2015,8:00 AM  LOS: 2 days

## 2015-07-05 NOTE — Progress Notes (Signed)
Patient is alert and oriented but forgetful. Patient refused any pain medication for headache and pain near groin area. Patient ate about 50% of her dinner.

## 2015-07-05 NOTE — Progress Notes (Signed)
Patient's has inspiratory and expiratory wheezes and sats are decreasing to 85-88%. Have increased oxygen from 3 L nasal cannula to 5 L Nasal cannula over a short period of time. Patient has been very sleepy all am is arouses fairly well but goes right back to sleep.Patient is also coughing after she drinks. Discussed with Jennifer Dom, NP. Transfer cancelled and SLP consult for swallow evaluation ordered.

## 2015-07-06 LAB — RENAL FUNCTION PANEL
Albumin: 2.3 g/dL — ABNORMAL LOW (ref 3.5–5.0)
Anion gap: 13 (ref 5–15)
BUN: 39 mg/dL — ABNORMAL HIGH (ref 6–20)
CO2: 26 mmol/L (ref 22–32)
Calcium: 8.1 mg/dL — ABNORMAL LOW (ref 8.9–10.3)
Chloride: 98 mmol/L — ABNORMAL LOW (ref 101–111)
Creatinine, Ser: 4.82 mg/dL — ABNORMAL HIGH (ref 0.44–1.00)
GFR calc Af Amer: 11 mL/min — ABNORMAL LOW (ref >60)
GFR calc non Af Amer: 9 mL/min — ABNORMAL LOW (ref >60)
Glucose, Bld: 116 mg/dL — ABNORMAL HIGH (ref 65–99)
Phosphorus: 5.9 mg/dL — ABNORMAL HIGH (ref 2.5–4.6)
Potassium: 4.6 mmol/L (ref 3.5–5.1)
Sodium: 137 mmol/L (ref 135–145)

## 2015-07-06 LAB — GLUCOSE, CAPILLARY
Glucose-Capillary: 95 mg/dL (ref 65–99)
Glucose-Capillary: 111 mg/dL — ABNORMAL HIGH (ref 65–99)

## 2015-07-06 LAB — CULTURE, RESPIRATORY W GRAM STAIN
Culture: NORMAL
Special Requests: NORMAL

## 2015-07-06 MED ORDER — METOPROLOL TARTRATE 25 MG PO TABS
25.0000 mg | ORAL_TABLET | Freq: Two times a day (BID) | ORAL | Status: DC
  Administered 2015-07-06 – 2015-07-08 (×4): 25 mg via ORAL
  Filled 2015-07-06 (×4): qty 1

## 2015-07-06 MED ORDER — SODIUM CHLORIDE 0.9 % IV SOLN: INTRAVENOUS | Status: DC | PRN

## 2015-07-06 MED ORDER — ALBUTEROL SULFATE (2.5 MG/3ML) 0.083% IN NEBU
2.5000 mg | INHALATION_SOLUTION | Freq: Four times a day (QID) | RESPIRATORY_TRACT | Status: DC
  Administered 2015-07-07 – 2015-07-08 (×5): 2.5 mg via RESPIRATORY_TRACT
  Filled 2015-07-06 (×5): qty 3

## 2015-07-06 MED ORDER — SIMVASTATIN 10 MG PO TABS
10.0000 mg | ORAL_TABLET | Freq: Every day | ORAL | Status: DC
  Administered 2015-07-06 – 2015-07-07 (×2): 10 mg via ORAL
  Filled 2015-07-06 (×2): qty 1

## 2015-07-06 MED ORDER — NICOTINE 14 MG/24HR TD PT24
14.0000 mg | MEDICATED_PATCH | Freq: Every day | TRANSDERMAL | Status: DC
  Filled 2015-07-06: qty 1

## 2015-07-06 MED ORDER — RESOURCE THICKENUP CLEAR PO POWD
ORAL | Status: DC | PRN
  Administered 2015-07-06: 14:00:00 via ORAL
  Filled 2015-07-06: qty 125

## 2015-07-06 MED ORDER — LORAZEPAM 0.5 MG PO TABS
0.5000 mg | ORAL_TABLET | Freq: Three times a day (TID) | ORAL | Status: DC | PRN
  Administered 2015-07-06 – 2015-07-07 (×3): 0.5 mg via ORAL
  Filled 2015-07-06 (×3): qty 1

## 2015-07-06 NOTE — Evaluation (Signed)
Clinical/Bedside Swallow Evaluation Patient Details  Name: Jennifer Lawson MRN: [id number] Date of Birth: 03/14/1961  Today's Date: 07/06/2015 Time: SLP Start Time (ACUTE ONLY): [id number] SLP Stop Time (ACUTE ONLY): 0856 SLP Time Calculation (min) (ACUTE ONLY): 21 min  Past Medical History:  Past Medical History  Diagnosis Date  . Mental disorder   . Depression   . Hypertension   . Overdose   . Tobacco use disorder 11/13/2012  . Complication of anesthesia     difficulty going to sleep  . Chronic kidney disease     06/11/13- not on dialysis  . Shortness of breath     lying down flat  . PTSD (post-traumatic stress disorder)   . Asthma   . COPD (chronic obstructive pulmonary disease) ([place])   . Heart murmur   . GERD (gastroesophageal reflux disease)   . Seizures ([place])     "passsed out"  . History of blood transfusion   . Diabetes mellitus without complication [facility])     denies   Past Surgical History:  Past Surgical History  Procedure Laterality Date  . Right knee replacement      she says it was last year.  . Esophagogastroduodenoscopy Left 11/14/2012    Procedure: ESOPHAGOGASTRODUODENOSCOPY (EGD);  Surgeon: [name], MD;  Location: [facility] END[facility];  Service: Endoscopy;  Laterality: Left;  . Joint replacement Right     knee  . Parathyroidectomy    . Av fistula placement Right 06/12/2013    Procedure: ARTERIOVENOUS (AV) FISTULA CREATION; RIGHT  BASILIC VEIN TRANSPOSITION with Intraoperative ultrasound;  Surgeon: Mal [name], MD;  Location: [facility] OR;  Service: Vascular;  Laterality: Right;   HPI:  This is a 55 year old female w/ h/o ESRD and O2 dependent COPD. Per EDP had been ill feeling for about a week. Had been exposed to sick grandson. Symptoms included: productive cough, nasal congestion, subjective fever and progressive dyspnea over a 4-5d period. Presented to the ED on 2/24 w/ fever >101, and O2 sats in 70s. Developed progressive agitation, hypoxia was refractory so was  intubated by EDP. Intubated from 2/24 to 2/25. Had been observed to coughin when drinking by RN.    Assessment / Plan / Recommendation Clinical Impression  Pt demonstrates signs of decreased airway protection including immediate cough with thin liquids and dysphonic vocal quality. Pts time intubated was relatively brief - 48 hours, and has been extubated for 48 hours as well, though symptoms persist. Pts hard coughing may also be a contributing factor to presumed laryngeal edema. Pt tolerated nectar thick liquids well without coughing or distress and is very resistant to objective testing. Pt struggles with reasoning and is easily distracted by environment. Given probable difficulty participating in objective test will proceed with diet modification for a few days and monitor for progress/need to push for FEES if no improvement. Recommend dys 3 diet and nectar thick liquids.     Aspiration Risk  Moderate aspiration risk    Diet Recommendation Dysphagia 3 (Mech soft);Nectar-thick liquid   Liquid Administration via: Cup;Straw Medication Administration: Whole meds with puree Supervision: Patient able to self feed Compensations: Slow rate;Small sips/bites;Minimize environmental distractions Postural Changes: Seated upright at 90 degrees    Other  Recommendations Oral Care Recommendations: Oral care BID Other Recommendations: Order thickener from pharmacy   Follow up Recommendations  24 hour supervision/assistance    Frequency and Duration min 2x/week  2 weeks       Prognosis Prognosis for Safe Diet Advancement: Good  Swallow Study   General HPI: This is a 55 year old female w/ h/o ESRD and O2 dependent COPD. Per EDP had been ill feeling for about a week. Had been exposed to sick grandson. Symptoms included: productive cough, nasal congestion, subjective fever and progressive dyspnea over a 4-5d period. Presented to the ED on 2/24 w/ fever >101, and O2 sats in 70s. Developed  progressive agitation, hypoxia was refractory so was intubated by EDP. Intubated from 2/24 to 2/25. Had been observed to coughin when drinking by RN.  Type of Study: Bedside Swallow Evaluation Previous Swallow Assessment: none Diet Prior to this Study: Regular;Thin liquids Temperature Spikes Noted: No Respiratory Status: Nasal cannula History of Recent Intubation: Yes Length of Intubations (days): 2 days Date extubated: 07/04/15 Behavior/Cognition: Alert;Cooperative;Requires cueing;Distractible Oral Cavity Assessment: Within Functional Limits Oral Care Completed by SLP: No Oral Cavity - Dentition: Edentulous;Dentures, not available Vision: Functional for self-feeding Self-Feeding Abilities: Able to feed self Patient Positioning: Upright in bed Baseline Vocal Quality: Breathy;Hoarse Volitional Cough: Strong Volitional Swallow: Able to elicit    Oral/Motor/Sensory Function Overall Oral Motor/Sensory Function: Within functional limits   Ice Chips     Thin Liquid Thin Liquid: Impaired Presentation: Cup;Self Fed Pharyngeal  Phase Impairments: Cough - Immediate    Nectar Thick Nectar Thick Liquid: Within functional limits Presentation: Straw;Self Fed   Honey Thick Honey Thick Liquid: Not tested   Puree Puree: Within functional limits   Solid   GO   Solid: Within functional limits Presentation: Piatt Arabela Basaldua, MA CCC-SLP [phone]  Lynann Beaver 07/06/2015,9:07 AM

## 2015-07-06 NOTE — Progress Notes (Signed)
Report called to Remo Lipps, RN on 6N.  Ruben Reason

## 2015-07-06 NOTE — Progress Notes (Signed)
Attempted to call report to 6N X2; RN unable to take report; will call back again at a later time.  Ruben Reason

## 2015-07-06 NOTE — Progress Notes (Signed)
PCCM PROGRESS NOTE  ADMISSION DATE: 07/03/2015 REFERRING PROVIDER: 07/06/2015  CC: ER  SUBJECTIVE: Feels better, but weak.  VITAL SIGNS: BP 134/85 mmHg  Pulse 86  Temp(Src) 98.3 F (36.8 C) (Oral)  Resp 17  Ht 5\' 6"  (1.676 m)  Wt 180 lb 12.4 oz (82 kg)  BMI 29.19 kg/m2  SpO2 94%  LMP 05/09/2008  INTAKE/OUTPUT: I/O last 3 completed shifts: In: 2065 [P.O.:1310; I.V.:305; IV Piggyback:450] Out: 2852 [Urine:350; Other:2500; Stool:2]  General: alert HEENT: no sinus tenderness Cardiac: regular, no murmur Chest: basilar crackles Abd: soft, non tender Ext: no edema Neuro: alert, slightly confused, follows commands Skin: no rashes   CMP Latest Ref Rng 07/06/2015 07/04/2015 07/03/2015  Glucose 65 - 99 mg/dL 116(H) 86 73  BUN 6 - 20 mg/dL 39(H) 33(H) 23(H)  Creatinine 0.44 - 1.00 mg/dL 4.82(H) 4.42(H) 3.88(H)  Sodium 135 - 145 mmol/L 137 141 142  Potassium 3.5 - 5.1 mmol/L 4.6 4.3 4.5  Chloride 101 - 111 mmol/L 98(L) 103 102  CO2 22 - 32 mmol/L 26 22 28   Calcium 8.9 - 10.3 mg/dL 8.1(L) 9.4 9.4  Total Protein 6.5 - 8.1 g/dL - - 6.9  Total Bilirubin 0.3 - 1.2 mg/dL - - 0.5  Alkaline Phos 38 - 126 U/L - - 113  AST 15 - 41 U/L - - 19  ALT 14 - 54 U/L - - 9(L)     CBC Latest Ref Rng 07/04/2015 07/03/2015 06/10/2015  WBC 4.0 - 10.5 K/uL 3.7(L) 4.8 5.0  Hemoglobin 12.0 - 15.0 g/dL 9.8(L) 11.0(L) 10.9(L)  Hematocrit 36.0 - 46.0 % 33.1(L) 37.6 35.7(L)  Platelets 150 - 400 K/uL 127(L) 124(L) 160     No results found.    LINES/TUBES: 2/24 ETT >> 2/25  CULTURES: 2/24 Blood >>  2/24 Sputum >> 2/24 Influenza PCR >> negative 2/26 Respiratory viral panel >>  ANTIBIOTICS: 2/24 Vancomycin >> 2/24 Zosyn >> 2/24 Tamiflu >> 2/25  STUDIES:  EVENTS: 2/24 Admit, renal consulted 2/27 Transfer to flr bed  DISCUSSION: 55 yo female with acute respiratory failure and encephalopathy 2nd to pneumonia.  ASSESSMENT/PLAN:  Acute hypoxic respiratory failure 2nd to  pneumonia. Plan: - Day 4/7 of Abx >> narrow once cx results final - f/u respiratory viral panel  Stridor post-extubation >> improved 2/27. Plan: - d/c solumedrol after dose on 2/27  Hx of COPD. Tobacco abuse. Plan: - dulera - nicotine patch  ESRD on HD T/W/S. Plan: - HD per renal  Deconditioning. Plan: - PT/OT  Dysphagia. Plan: - D3 diet - f/u with speech therapy  Acute encephalopathy 2nd to respiratory failure >> slowly improving. Hx of Bipolar disease. Plan: - continue depakote, doxepin, geodon - prn ativan  Hx of HLD. Plan: - zocor  Will transfer to non tele floor bed 2/27.  Will ask [facility] to assume care 2/28 and PCCM sign off.  Chesley Mires, MD [facility] Pulmonary/Critical Care 07/06/2015, 10:04 AM Pager:  [phone] After 3pm call: [phone]

## 2015-07-06 NOTE — Progress Notes (Signed)
Subjective: remains on [place], BP stable; talkative, no complaint  Objective Vital signs in last 24 hours: Filed Vitals:   07/06/15 0700 07/06/15 0752 07/06/15 0800 07/06/15 0850  BP: 145/79  138/77   Pulse: 83  79   Temp:    98.3 F (36.8 C)  TempSrc:    Oral  Resp: 27  16   Height:      Weight:      SpO2: 96% 99% 100%    Weight change: -4.9 kg (-10 lb 12.8 oz)  Intake/Output Summary (Last 24 hours) at 07/06/15 0851 Last data filed at 07/06/15 0800  Gross per 24 hour  Intake   1595 ml  Output    352 ml  Net   1243 ml    Dialyzes at [place] 83 kg- got to 84 kg PTA HD [place] 2K/2 calcium, Dialyzer 180, Heparin no. Access AV fistula right upper arm. Hectorol 3 g every treatment also on Sensipar 60 and Renvela 1600 3 times a day last calcium 8, phosphorus 5.1, PTH 1739 Received Mircera on 2/22 last hemoglobin 10.5   Assessment/Plan: 55 year old black female with ESRD who presents with a pulmonary infection type history with productive cough and fever whose symptoms escalated in the emergency department requiring intubation for hypoxia and hypercarbia 1 pulmonary- history suggestive of an infectious etiology. Progressive symptoms requiring intubation. Plans per CCM- steroids, Zosyn, vancomycin as well as Tamiflu- extubated on 2/25 evening- doing much better in this regard  2 ESRD: Normally TTS at [place] via AVF. S/p HD last night- needed benzo but hemodynamically tolerated well- plan for nest tx on Tuesday  3 Hypertension: Actually not an issue right now. Under EDW 4. Anemia of ESRD: Fairly well controlled. Received Mircera on 2/22 5. Metabolic Bone Disease: resume  her Sensipar and Renvela 6. Psych history- will likely become an issue once extubated     Rexene Agent    Labs: Basic Metabolic Panel:  Recent Labs Lab 07/03/15 1430 07/04/15 0534 07/06/15 0807  NA 142 141 137  K 4.5 4.3 4.6  CL 102 103 98*  CO2 28 22 26   GLUCOSE 73 86 116*  BUN 23* 33* 39*   CREATININE 3.88* 4.42* 4.82*  CALCIUM 9.4 9.4 8.1*  PHOS  --  5.4* 5.9*   Liver Function Tests:  Recent Labs Lab 07/03/15 1430 07/04/15 0534 07/06/15 0807  AST 19  --   --   ALT 9*  --   --   ALKPHOS 113  --   --   BILITOT 0.5  --   --   PROT 6.9  --   --   ALBUMIN 2.6* 2.3* 2.3*   No results for input(s): LIPASE, AMYLASE in the last 168 hours. No results for input(s): AMMONIA in the last 168 hours. CBC:  Recent Labs Lab 07/03/15 1430 07/04/15 0534  WBC 4.8 3.7*  NEUTROABS 3.2  --   HGB 11.0* 9.8*  HCT 37.6 33.1*  MCV 101.3* 99.1  PLT 124* 127*   Cardiac Enzymes: No results for input(s): CKTOTAL, CKMB, CKMBINDEX, TROPONINI in the last 168 hours. CBG:  Recent Labs Lab 07/05/15 0921 07/05/15 1006 07/05/15 1233 07/05/15 1652 07/05/15 2157  GLUCAP 64* 82 124* 163* 130*    Iron Studies: No results for input(s): IRON, TIBC, TRANSFERRIN, FERRITIN in the last 72 hours. Studies/Results: No results found. Medications: Infusions: . sodium chloride Stopped (07/06/15 0800)    Scheduled Medications: . cinacalcet  60 mg Oral Q supper  . divalproex  1,000 mg Oral QHS  . divalproex  500 mg Oral q morning - 10a  . doxepin  25 mg Oral QHS  . heparin subcutaneous  5,000 Units Subcutaneous 3 times per day  . methylPREDNISolone sodium succinate  40 mg Intravenous Q12H  . mometasone-formoterol  2 puff Inhalation BID  . nicotine  21 mg Transdermal Daily  . piperacillin-tazobactam (ZOSYN)  IV  2.25 g Intravenous 3 times per day  . sevelamer carbonate  1,600 mg Oral TID WC  . ziprasidone  60 mg Oral BID WC    have reviewed scheduled and prn medications.  Physical Exam: General: awake- wants coffee and breakfast  Heart: RRR Lungs: CBS bilat Abdomen: soft, non tender Extremities: minimal  edema Dialysis Access: right upper arm AVF- patent     07/06/2015,8:51 AM  LOS: 3 days

## 2015-07-06 NOTE — Progress Notes (Signed)
Pt concerned that her home med Metoprolol is not currently ordered; MD paged to make aware; new orders written; will cont. To monitor.  Ruben Reason

## 2015-07-06 NOTE — Progress Notes (Signed)
Patient requested something stronger than tylenol for pain and PO Toradol given once for pain. Patient then slept well and didn't want any pain medication. Neb treatment given once for shortness of breadth (Oxygen level was 93%) and tylenol once. Patient is on 5 liters of humidified oxygen. Slept well overnight and removed her oxygen three times and was angry when I applied her oxygen back on.

## 2015-07-06 NOTE — Progress Notes (Signed)
Pt transferred to 6N11 via wheelchair; pt belongings along with transfer; pt assisted to bed once in room.  Jennifer Lawson

## 2015-07-07 ENCOUNTER — Inpatient Hospital Stay (HOSPITAL_COMMUNITY): Payer: Medicare Other

## 2015-07-07 DIAGNOSIS — F3162 Bipolar disorder, current episode mixed, moderate: Secondary | ICD-10-CM

## 2015-07-07 DIAGNOSIS — I10 Essential (primary) hypertension: Secondary | ICD-10-CM

## 2015-07-07 DIAGNOSIS — J189 Pneumonia, unspecified organism: Secondary | ICD-10-CM

## 2015-07-07 LAB — CBC
HCT: 34.5 % — ABNORMAL LOW (ref 36.0–46.0)
Hemoglobin: 10.4 g/dL — ABNORMAL LOW (ref 12.0–15.0)
MCH: 30.1 pg (ref 26.0–34.0)
MCHC: 30.1 g/dL (ref 30.0–36.0)
MCV: 99.7 fL (ref 78.0–100.0)
Platelets: 211 10*3/uL (ref 150–400)
RBC: 3.46 MIL/uL — ABNORMAL LOW (ref 3.87–5.11)
RDW: 19.8 % — ABNORMAL HIGH (ref 11.5–15.5)
WBC: 6.3 10*3/uL (ref 4.0–10.5)

## 2015-07-07 LAB — RESPIRATORY VIRUS PANEL
Adenovirus: NEGATIVE
Influenza A: NEGATIVE
Influenza B: NEGATIVE
Metapneumovirus: POSITIVE — AB
Parainfluenza 1: NEGATIVE
Parainfluenza 2: NEGATIVE
Parainfluenza 3: NEGATIVE
Respiratory Syncytial Virus A: NEGATIVE
Respiratory Syncytial Virus B: NEGATIVE
Rhinovirus: NEGATIVE

## 2015-07-07 LAB — RENAL FUNCTION PANEL
Albumin: 2.5 g/dL — ABNORMAL LOW (ref 3.5–5.0)
Anion gap: 10 (ref 5–15)
BUN: 57 mg/dL — ABNORMAL HIGH (ref 6–20)
CO2: 27 mmol/L (ref 22–32)
Calcium: 8.5 mg/dL — ABNORMAL LOW (ref 8.9–10.3)
Chloride: 102 mmol/L (ref 101–111)
Creatinine, Ser: 5.51 mg/dL — ABNORMAL HIGH (ref 0.44–1.00)
GFR calc Af Amer: 9 mL/min — ABNORMAL LOW (ref >60)
GFR calc non Af Amer: 8 mL/min — ABNORMAL LOW (ref >60)
Glucose, Bld: 76 mg/dL (ref 65–99)
Phosphorus: 5.7 mg/dL — ABNORMAL HIGH (ref 2.5–4.6)
Potassium: 4.2 mmol/L (ref 3.5–5.1)
Sodium: 139 mmol/L (ref 135–145)

## 2015-07-07 MED ORDER — PANTOPRAZOLE SODIUM 40 MG PO TBEC
40.0000 mg | DELAYED_RELEASE_TABLET | Freq: Every day | ORAL | Status: DC
  Administered 2015-07-07 – 2015-07-08 (×2): 40 mg via ORAL
  Filled 2015-07-07 (×2): qty 1

## 2015-07-07 MED ORDER — LEVOFLOXACIN IN D5W 750 MG/150ML IV SOLN
750.0000 mg | Freq: Once | INTRAVENOUS | Status: AC
  Administered 2015-07-07: 750 mg via INTRAVENOUS
  Filled 2015-07-07: qty 150

## 2015-07-07 MED ORDER — LEVOFLOXACIN IN D5W 500 MG/100ML IV SOLN: 500.0000 mg | INTRAVENOUS | Status: DC

## 2015-07-07 NOTE — Progress Notes (Signed)
PATIENT DETAILS Name: Jennifer Lawson Age: 55 y.o. Sex: female Date of Birth: Aug 02, 1960 Admit Date: 07/03/2015 Admitting Physician Chesley Mires, MD [id number] [facility] PA  Subjective: 55 year old female with history of ESRD on hemodialysis, oxygen dependent COPD who presented to the ED on 2/24 with aggressive dyspnea, subjective fever, cough and nasal congestion. She was intubated in the emergency room, managed in the intensive care unit by PCCM, extubated on 2/26 and transferred to [facility] on 2/28. See below for further details.  Assessment/Plan: Active Problems: Acute on chronic hypoxic respiratory failure: Secondary to pneumonia, acute exacerbation of COPD and possible mild laryngeal edema. Intubated on admission, extubated on 12/26. Now on 2 L via nasal cannula-titrate oxygen back to home dosing.  Healthcare associated pneumonia: Empirically managed with vancomycin and Zosyn. Blood cultures negative, influenza panel negative, respiratory virus panel positive for metapneumonia virus. We will discontinue vancomycin and Zosyn-start levofloxacin-to complete at least a seven-day course of antibiotics.  Hoarseness of voice: Occurred Post extubation-no response to steroids-no stridor on exam-wIll consult ENT for a direct laryngoscopic evaluation.  Dysphagia: Continue dysphagia 3 diet, speech therapy following.  Hypertension: Controlled, continue metoprolol  Dyslipidemia: Continue statin  End-stage renal disease: Nephrology following, undergoes hemodialysis on Tuesdays, Wednesdays and Saturdays.  Acute encephalopathy: Secondary to pneumonia/hypoxia. Resolved.  History of bipolar disorder: Appears stable, continue Depakote, doxepin and Geodon.  Tobacco abuse: Continue transdermal nicotine, and counseled  Deconditioning:due to acute illness-PT/OT  Disposition: Remain inpatient  Antimicrobial agents  See below  Anti-infectives    Start      Dose/Rate Route Frequency Ordered Stop   07/07/15 1200  vancomycin (VANCOCIN) IVPB 1000 mg/200 mL premix  Status:  Discontinued     1,000 mg 200 mL/hr over 60 Minutes Intravenous Every T-Th-Sa (Hemodialysis) 07/04/15 1736 07/05/15 0823   07/04/15 2000  vancomycin (VANCOCIN) IVPB 1000 mg/200 mL premix     1,000 mg 200 mL/hr over 60 Minutes Intravenous  Once 07/04/15 1732 07/04/15 2120   07/04/15 1900  piperacillin-tazobactam (ZOSYN) IVPB 2.25 g     2.25 g 100 mL/hr over 30 Minutes Intravenous 3 times per day 07/04/15 1730     07/03/15 2200  piperacillin-tazobactam (ZOSYN) IVPB 2.25 g  Status:  Discontinued     2.25 g 100 mL/hr over 30 Minutes Intravenous 3 times per day 07/03/15 1419 07/04/15 1730   07/03/15 1830  oseltamivir (TAMIFLU) 6 MG/ML suspension 30 mg  Status:  Discontinued     30 mg Per Tube Daily 07/03/15 1651 07/04/15 1135   07/03/15 1430  vancomycin (VANCOCIN) 1,500 mg in sodium chloride 0.9 % 500 mL IVPB     1,500 mg 250 mL/hr over 120 Minutes Intravenous  Once 07/03/15 1424 07/03/15 1842   07/03/15 1330  piperacillin-tazobactam (ZOSYN) IVPB 3.375 g  Status:  Discontinued     3.375 g 100 mL/hr over 30 Minutes Intravenous  Once 07/03/15 1318 07/03/15 1319   07/03/15 1330  vancomycin (VANCOCIN) IVPB 1000 mg/200 mL premix  Status:  Discontinued     1,000 mg 200 mL/hr over 60 Minutes Intravenous  Once 07/03/15 1318 07/03/15 1423   07/03/15 1330  piperacillin-tazobactam (ZOSYN) IVPB 3.375 g     3.375 g 100 mL/hr over 30 Minutes Intravenous  Once 07/03/15 1319 07/03/15 1623   07/03/15 1315  cefTRIAXone (ROCEPHIN) 1 g in dextrose 5 % 50 mL IVPB  Status:  Discontinued     1  g 100 mL/hr over 30 Minutes Intravenous  Once 07/03/15 1314 07/03/15 1317   07/03/15 1315  azithromycin (ZITHROMAX) 500 mg in dextrose 5 % 250 mL IVPB  Status:  Discontinued     500 mg 250 mL/hr over 60 Minutes Intravenous  Once 07/03/15 1314 07/03/15 1317      DVT Prophylaxis: Prophylactic  Heparin  Code Status: Full code  Family Communication None at bedside  Procedures: Intubation-2/24 Extubation-2/26  CONSULTS:  pulmonary/intensive care and ENT  Time spent 30 minutes-Greater than 50% of this time was spent in counseling, explanation of diagnosis, planning of further management, and coordination of care.  MEDICATIONS: Scheduled Meds: . albuterol  2.5 mg Nebulization QID  . cinacalcet  60 mg Oral Q supper  . divalproex  1,000 mg Oral QHS  . divalproex  500 mg Oral q morning - 10a  . doxepin  25 mg Oral QHS  . heparin subcutaneous  5,000 Units Subcutaneous 3 times per day  . metoprolol tartrate  25 mg Oral BID  . mometasone-formoterol  2 puff Inhalation BID  . nicotine  14 mg Transdermal Daily  . piperacillin-tazobactam (ZOSYN)  IV  2.25 g Intravenous 3 times per day  . sevelamer carbonate  1,600 mg Oral TID WC  . simvastatin  10 mg Oral QHS  . ziprasidone  60 mg Oral BID WC   Continuous Infusions:  PRN Meds:.sodium chloride, sodium chloride, sodium chloride, acetaminophen, albuterol, alteplase, bisacodyl, lidocaine (PF), lidocaine-prilocaine, LORazepam, pentafluoroprop-tetrafluoroeth, RESOURCE THICKENUP CLEAR, sennosides    PHYSICAL EXAM: Vital signs in last 24 hours: Filed Vitals:   07/07/15 1300 07/07/15 1330 07/07/15 1400 07/07/15 1430  BP: 105/62 106/56 114/55 101/70  Pulse: 77 78 83 85  Temp:    98.7 F (37.1 C)  TempSrc:    Oral  Resp: 15 16 15 16   Height:      Weight:      SpO2:    98%    Weight change: 3.9 kg (8 lb 9.6 oz) Filed Weights   07/06/15 0637 07/07/15 0645 07/07/15 1022  Weight: 82 kg (180 lb 12.4 oz) 85.9 kg (189 lb 6 oz) 87.3 kg (192 lb 7.4 oz)   Body mass index is 31.08 kg/(m^2).   Gen Exam: Awake and alert with clear speech.  Neck: Supple, No JVD.   Chest: Few bibasilar rales CVS: S1 S2 Regular, no murmurs.  Abdomen: soft, BS +, non tender, non distended.  Extremities: no edema, lower extremities warm to  touch. Neurologic: Non Focal.   Skin: No Rash.   Wounds: N/A.   Intake/Output from previous day:  Intake/Output Summary (Last 24 hours) at 07/07/15 1436 Last data filed at 07/06/15 2055  Gross per 24 hour  Intake    360 ml  Output    300 ml  Net     60 ml     LAB RESULTS: CBC  Recent Labs Lab 07/03/15 1430 07/04/15 0534 07/07/15 1030  WBC 4.8 3.7* 6.3  HGB 11.0* 9.8* 10.4*  HCT 37.6 33.1* 34.5*  PLT 124* 127* 211  MCV 101.3* 99.1 99.7  MCH 29.6 29.3 30.1  MCHC 29.3* 29.6* 30.1  RDW 19.9* 20.3* 19.8*  LYMPHSABS 1.0  --   --   MONOABS 0.5  --   --   EOSABS 0.0  --   --   BASOSABS 0.0  --   --     Chemistries   Recent Labs Lab 07/03/15 1430 07/04/15 0534 07/06/15 0807 07/07/15 1030  NA 142  141 137 139  K 4.5 4.3 4.6 4.2  CL 102 103 98* 102  CO2 28 22 26 27   GLUCOSE 73 86 116* 76  BUN 23* 33* 39* 57*  CREATININE 3.88* 4.42* 4.82* 5.51*  CALCIUM 9.4 9.4 8.1* 8.5*    CBG:  Recent Labs Lab 07/05/15 1233 07/05/15 1652 07/05/15 2157 07/06/15 0848 07/06/15 1248  GLUCAP 124* 163* 130* 95 111*    GFR Estimated Creatinine Clearance: 12.8 mL/min (by C-G formula based on Cr of 5.51).  Coagulation profile No results for input(s): INR, PROTIME in the last 168 hours.  Cardiac Enzymes No results for input(s): CKMB, TROPONINI, MYOGLOBIN in the last 168 hours.  Invalid input(s): CK  Invalid input(s): POCBNP No results for input(s): DDIMER in the last 72 hours. No results for input(s): HGBA1C in the last 72 hours. No results for input(s): CHOL, HDL, LDLCALC, TRIG, CHOLHDL, LDLDIRECT in the last 72 hours. No results for input(s): TSH, T4TOTAL, T3FREE, THYROIDAB in the last 72 hours.  Invalid input(s): FREET3 No results for input(s): VITAMINB12, FOLATE, FERRITIN, TIBC, IRON, RETICCTPCT in the last 72 hours. No results for input(s): LIPASE, AMYLASE in the last 72 hours.  Urine Studies No results for input(s): UHGB, CRYS in the last 72 hours.  Invalid  input(s): UACOL, UAPR, USPG, UPH, UTP, UGL, UKET, UBIL, UNIT, UROB, ULEU, UEPI, UWBC, URBC, UBAC, CAST, UCOM, BILUA  MICROBIOLOGY: Recent Results (from the past 240 hour(s))  Blood Culture (routine x 2)     Status: None (Preliminary result)   Collection Time: 07/03/15  2:30 PM  Result Value Ref Range Status   Specimen Description BLOOD LEFT FOREARM  Final   Special Requests BOTTLES DRAWN AEROBIC AND ANAEROBIC 5CC  Final   Culture NO GROWTH 3 DAYS  Final   Report Status PENDING  Incomplete  Culture, respiratory (NON-Expectorated)     Status: None   Collection Time: 07/03/15  6:20 PM  Result Value Ref Range Status   Specimen Description TRACHEAL ASPIRATE  Final   Special Requests Normal  Final   Gram Stain   Final    MODERATE WBC PRESENT,BOTH PMN AND MONONUCLEAR RARE SQUAMOUS EPITHELIAL CELLS PRESENT NO ORGANISMS SEEN Performed at [facility]    Culture   Final    NORMAL OROPHARYNGEAL FLORA Performed at [facility]    Report Status 07/06/2015 FINAL  Final  MRSA PCR Screening     Status: None   Collection Time: 07/03/15  7:14 PM  Result Value Ref Range Status   MRSA by PCR NEGATIVE NEGATIVE Final    Comment:        The GeneXpert MRSA Assay (FDA approved for NASAL specimens only), is one component of a comprehensive MRSA colonization surveillance program. It is not intended to diagnose MRSA infection nor to guide or monitor treatment for MRSA infections.   Blood Culture (routine x 2)     Status: None (Preliminary result)   Collection Time: 07/03/15  8:00 PM  Result Value Ref Range Status   Specimen Description BLOOD RIGHT HAND  Final   Special Requests BOTTLES DRAWN AEROBIC ONLY 8CC  Final   Culture NO GROWTH 3 DAYS  Final   Report Status PENDING  Incomplete  Respiratory virus panel     Status: Abnormal   Collection Time: 07/05/15  5:57 PM  Result Value Ref Range Status   Respiratory Syncytial Virus A Negative Negative Final   Respiratory Syncytial  Virus B Negative Negative Final   Influenza A Negative  Negative Final   Influenza B Negative Negative Final   Parainfluenza 1 Negative Negative Final   Parainfluenza 2 Negative Negative Final   Parainfluenza 3 Negative Negative Final   Metapneumovirus Positive (A) Negative Final   Rhinovirus Negative Negative Final   Adenovirus Negative Negative Final    Comment: (NOTE) Performed At: [facility] [address] [place], [place] [id number] Lindon Romp MD [id number]     RADIOLOGY STUDIES/RESULTS: Dg Chest 1 View  06/10/2015  CLINICAL DATA:  Altered mental status.  Hypertension and COPD. EXAM: CHEST 1 VIEW COMPARISON:  01/28/2015 FINDINGS: Cardiac silhouette is markedly enlarged. The aorta shows ectasia and tortuosity. There is pulmonary venous hypertension, possibly with early interstitial edema. No frank pulmonary edema. No effusions. No infiltrate, collapse or effusion. IMPRESSION: Cardiomegaly. Pulmonary venous hypertension. Question early interstitial edema. Electronically Signed   By: Nelson Chimes M.D.   On: 06/10/2015 16:08   Dg Chest 2 View  07/03/2015  CLINICAL DATA:  Shortness of breath today, smoker, cough, congestion, hypertension, COPD, chronic kidney disease, diabetes mellitus EXAM: CHEST  2 VIEW COMPARISON:  06/09/2005 FINDINGS: Enlargement of cardiac silhouette with pulmonary vascular congestion. Calcification and tortuosity of thoracic aorta. Increased markings in the mid to lower lungs bilaterally likely reflects subtle infiltrate, favor edema over infection. Minimal basilar atelectasis. No gross pleural effusion or pneumothorax. Bones unremarkable. IMPRESSION: Enlargement of cardiac silhouette with pulmonary vascular congestion and probable subtle infiltrates in the mid to lower lungs, favor mild edema. Electronically Signed   By: Lavonia Dana M.D.   On: 07/03/2015 15:01   Dg Chest Port 1 View  07/07/2015  CLINICAL DATA:  Shortness of Breath EXAM: PORTABLE CHEST  1 VIEW COMPARISON:  07/04/2015 FINDINGS: Cardiac shadow is enlarged in size. The lungs are well aerated bilaterally. Mild central vascular congestion is again seen. The endotracheal tube and nasogastric catheter have been removed in the interval. No focal infiltrate is seen. IMPRESSION: Mild vascular congestion.  No focal infiltrate noted. Electronically Signed   By: Inez Catalina M.D.   On: 07/07/2015 07:44   Dg Chest Port 1 View  07/04/2015  CLINICAL DATA:  Acute respiratory failure EXAM: PORTABLE CHEST 1 VIEW COMPARISON:  07/03/2015 FINDINGS: Endotracheal tube tip 2.1 cm above the carina. Nasogastric tube enters the stomach. Mild enlargement of the cardiopericardial silhouette with indistinct pulmonary vasculature. Mild atherosclerotic calcification of the aortic arch. Linear subsegmental atelectasis in the left mid lung. Indistinctness of part of the left hemidiaphragm could reflect mild left lower lobe atelectasis. Mildly improved opacity medially at the right lung base. IMPRESSION: 1. The endotracheal tube has been retracted and is in a satisfactorily position with tip 2.1 cm above the carina. 2. Subtle basilar and left mid lung opacities. 3. Mild enlargement of the cardiopericardial silhouette. Electronically Signed   By: Van Clines M.D.   On: 07/04/2015 08:37   Dg Chest Portable 1 View  07/03/2015  CLINICAL DATA:  Intubation. EXAM: PORTABLE CHEST 1 VIEW COMPARISON:  Earlier the same day FINDINGS: 1640 hours. Endotracheal tube tip is in the right mainstem bronchus. Endotracheal tube could be pulled back approximately 2.5 cm for more appropriate positioning. The NG tube passes into the stomach although the distal tip position is not included on the film. The cardio pericardial silhouette is enlarged. Bibasilar airspace disease is more prominent now. The visualized bony structures of the thorax are intact. Telemetry leads overlie the chest. IMPRESSION: 1. Right mainstem intubation. Endotracheal  tube could be pulled back about 2.5 cm  for more appropriate positioning. 2. Interval increase in bibasilar airspace disease raising concern for progressive edema given the timeframe. Superimposed infection remains a consideration. Results were called directly to Dr. Audie Pinto at approximately 1700 hours on 07/03/2015. Electronically Signed   By: [name] Stanley M.D.   On: 07/03/2015 17:08    Oren Binet, MD  [facility] Pager:[phone]  If 7PM-7AM, please contact night-coverage [email or web address] Password [email or web address] 07/07/2015, 2:36 PM   LOS: 4 days

## 2015-07-07 NOTE — Procedures (Signed)
I was present at this dialysis session. I have reviewed the session itself and made appropriate changes.   Resp Virus Panel +metapneumovirus.  On 2L [place].  BP stable.  Next HD 3/2  Pearson Grippe  MD 07/07/2015, 10:40 AM

## 2015-07-07 NOTE — Progress Notes (Signed)
PT Cancellation Note  Patient Details Name: Jennifer Lawson MRN: [id number] DOB: 1961-04-22   Cancelled Treatment:    Reason Eval/Treat Not Completed: Patient at procedure or test/unavailable.  Pt in HD.  PT to check back later today or tomorrow as time allows.   Thanks,    Barbarann Ehlers. Yell, [place], DPT [phone]   07/07/2015, 11:52 AM

## 2015-07-07 NOTE — Consult Note (Signed)
ENT CONSULT:  Reason for Consult: Hoarseness Referring Physician: [facility]  Jennifer Lawson is an 55 y.o. female.  HPI: the patient is a 55 year old female who was admitted to [facility] through the emergency department with exacerbation of shortness of breath. She required intubation for hypoxia. She has a history of end-stage renal disease and oxygen dependent COPD. The patient required intubation 2/24 for airway management and was admitted to the intensive care unit. Patient was improved and extubated on 2/26. Post extubation she has had hoarseness and aspiration with liquids. No significant prior vocal issues. She has chronic asthma but no active respiratory concerns.  Past Medical History  Diagnosis Date  . Mental disorder   . Depression   . Hypertension   . Overdose   . Tobacco use disorder 11/13/2012  . Complication of anesthesia     difficulty going to sleep  . Chronic kidney disease     06/11/13- not on dialysis  . Shortness of breath     lying down flat  . PTSD (post-traumatic stress disorder)   . Asthma   . COPD (chronic obstructive pulmonary disease) ([place])   . Heart murmur   . GERD (gastroesophageal reflux disease)   . Seizures ([place])     "passsed out"  . History of blood transfusion   . Diabetes mellitus without complication [facility])     denies    Past Surgical History  Procedure Laterality Date  . Right knee replacement      she says it was last year.  . Esophagogastroduodenoscopy Left 11/14/2012    Procedure: ESOPHAGOGASTRODUODENOSCOPY (EGD);  Surgeon: [name], MD;  Location: [facility] ENDOSCOPY;  Service: Endoscopy;  Laterality: Left;  . Joint replacement Right     knee  . Parathyroidectomy    . Av fistula placement Right 06/12/2013    Procedure: ARTERIOVENOUS (AV) FISTULA CREATION; RIGHT  BASILIC VEIN TRANSPOSITION with Intraoperative ultrasound;  Surgeon: Mal [name], MD;  Location[facility];  Service: Vascular;  Laterality: Right;    Family History   Problem Relation Age of Onset  . Diabetes Mother   . Hyperlipidemia Mother   . Hypertension Mother   . Diabetes Father   . Hypertension Father   . Hyperlipidemia Father     Social History:  reports that she has been smoking Cigarettes and Cigars.  She has a 80 pack-year smoking history. She has never used smokeless tobacco. She reports that she does not drink alcohol or use illicit drugs.  Allergies:  Allergies  Allergen Reactions  . Codeine Sulfate Anaphylaxis    Daughter called about having this allergy   . Gabapentin Other (See Comments) and Anaphylaxis    seizures  . Haldol [Haloperidol Lactate] Shortness Of Breath  . Risperidone And Related Shortness Of Breath  . Trazodone And Nefazodone Other (See Comments)    Makes pt lose balance and fall  . Depakote [Divalproex Sodium] Other (See Comments)    Nose bleeds-currently taking medication  . Haloperidol Other (See Comments)    hallucinations  . Invega [Paliperidone Er] Nausea And Vomiting  . Risperidone Other (See Comments)    Hallucinations?  Marland Kitchen Vistaril [Hydroxyzine Hcl] Nausea And Vomiting  . Hydrocodone-Acetaminophen Itching    Not allergic to aceetaminophen    Medications: I have reviewed the patient's current medications.  Results for orders placed or performed during the hospital encounter of 07/03/15 (from the past 48 hour(s))  Glucose, capillary     Status: Abnormal   Collection Time: 07/05/15  4:52 PM  Result Value Ref Range   Glucose-Capillary 163 (H) 65 - 99 mg/dL   Comment 1 Notify RN   Respiratory virus panel     Status: Abnormal   Collection Time: 07/05/15  5:57 PM  Result Value Ref Range   Respiratory Syncytial Virus A Negative Negative   Respiratory Syncytial Virus B Negative Negative   Influenza A Negative Negative   Influenza B Negative Negative   Parainfluenza 1 Negative Negative   Parainfluenza 2 Negative Negative   Parainfluenza 3 Negative Negative   Metapneumovirus Positive (A) Negative    Rhinovirus Negative Negative   Adenovirus Negative Negative    Comment: (NOTE) Performed At: [facility] [address] [place], [place] [id number] Lindon Romp MD [id number]   Glucose, capillary     Status: Abnormal   Collection Time: 07/05/15  9:57 PM  Result Value Ref Range   Glucose-Capillary 130 (H) 65 - 99 mg/dL   Comment 1 Notify RN    Comment 2 Document in Chart   Renal function panel     Status: Abnormal   Collection Time: 07/06/15  8:07 AM  Result Value Ref Range   Sodium 137 135 - 145 mmol/L   Potassium 4.6 3.5 - 5.1 mmol/L   Chloride 98 (L) 101 - 111 mmol/L   CO2 26 22 - 32 mmol/L   Glucose, Bld 116 (H) 65 - 99 mg/dL   BUN 39 (H) 6 - 20 mg/dL   Creatinine, Ser 4.82 (H) 0.44 - 1.00 mg/dL   Calcium 8.1 (L) 8.9 - 10.3 mg/dL   Phosphorus 5.9 (H) 2.5 - 4.6 mg/dL   Albumin 2.3 (L) 3.5 - 5.0 g/dL   GFR calc non Af Amer 9 (L) >60 mL/min   GFR calc Af Amer 11 (L) >60 mL/min    Comment: (NOTE) The eGFR has been calculated using the CKD EPI equation. This calculation has not been validated in all clinical situations. eGFR's persistently <60 mL/min signify possible Chronic Kidney Disease.    Anion gap 13 5 - 15  Glucose, capillary     Status: None   Collection Time: 07/06/15  8:48 AM  Result Value Ref Range   Glucose-Capillary 95 65 - 99 mg/dL   Comment 1 Capillary Specimen    Comment 2 Notify RN   Glucose, capillary     Status: Abnormal   Collection Time: 07/06/15 12:48 PM  Result Value Ref Range   Glucose-Capillary 111 (H) 65 - 99 mg/dL   Comment 1 Capillary Specimen    Comment 2 Notify RN   CBC     Status: Abnormal   Collection Time: 07/07/15 10:30 AM  Result Value Ref Range   WBC 6.3 4.0 - 10.5 K/uL   RBC 3.46 (L) 3.87 - 5.11 MIL/uL   Hemoglobin 10.4 (L) 12.0 - 15.0 g/dL   HCT 34.5 (L) 36.0 - 46.0 %   MCV 99.7 78.0 - 100.0 fL   MCH 30.1 26.0 - 34.0 pg   MCHC 30.1 30.0 - 36.0 g/dL   RDW 19.8 (H) 11.5 - 15.5 %   Platelets 211 150 - 400 K/uL   Renal function panel     Status: Abnormal   Collection Time: 07/07/15 10:30 AM  Result Value Ref Range   Sodium 139 135 - 145 mmol/L   Potassium 4.2 3.5 - 5.1 mmol/L   Chloride 102 101 - 111 mmol/L   CO2 27 22 - 32 mmol/L   Glucose, Bld 76 65 - 99  mg/dL   BUN 57 (H) 6 - 20 mg/dL   Creatinine, Ser 5.51 (H) 0.44 - 1.00 mg/dL   Calcium 8.5 (L) 8.9 - 10.3 mg/dL   Phosphorus 5.7 (H) 2.5 - 4.6 mg/dL   Albumin 2.5 (L) 3.5 - 5.0 g/dL   GFR calc non Af Amer 8 (L) >60 mL/min   GFR calc Af Amer 9 (L) >60 mL/min    Comment: (NOTE) The eGFR has been calculated using the CKD EPI equation. This calculation has not been validated in all clinical situations. eGFR's persistently <60 mL/min signify possible Chronic Kidney Disease.    Anion gap 10 5 - 15    Dg Chest Port 1 View  07/07/2015  CLINICAL DATA:  Shortness of Breath EXAM: PORTABLE CHEST 1 VIEW COMPARISON:  07/04/2015 FINDINGS: Cardiac shadow is enlarged in size. The lungs are well aerated bilaterally. Mild central vascular congestion is again seen. The endotracheal tube and nasogastric catheter have been removed in the interval. No focal infiltrate is seen. IMPRESSION: Mild vascular congestion.  No focal infiltrate noted. Electronically Signed   By: Inez Catalina M.D.   On: 07/07/2015 07:44    ROS:ROS 12 systems reviewed and negative except as stated in HPI   Blood pressure 101/70, pulse 85, temperature 98.7 F (37.1 C), temperature source Oral, resp. rate 16, height 5' 6"  (1.676 m), weight 87.3 kg (192 lb 7.4 oz), last menstrual period 05/09/2008, SpO2 98 %.  PHYSICAL EXAM: General appearance - alert, well appearing, and in no distress and hoarse voice without stridor. Nose - moderate erythema of the nasal mucosa with mucopurulent discharge bilaterally, no bleeding or polyps. Mouth - mucous membranes moist, pharynx normal without lesions and partially edentulous, no mucosal lesion or mass.  Procedure: Flexible Laryngoscopy 4 mm  flexible laryngoscope inserted through the nasal passageway with minimal discomfort. Nasal cavity and nasopharynx show purulent mucoid discharge consistent with sinusitis, no mass or polyp. Normal base of tongue and supraglottis, normal vocal cord mobility. No evidence of vocal cord paralysis, mass or lesion. Moderate soft tissue edema of the laryngeal mucosa.   Assessment/Plan: Patient has a history of hoarseness after extubation, no evidence of vocal cord pathology, paralysis or dislocation of the arytenoid. She has moderate soft tissue edema consistent with recent extubation. No evidence of aspiration or other findings. Continue speech therapy and swallowing as tolerated. Expect continued improvement in symptoms over time with return to normal voice over the next several weeks. No further intervention at this time. Monitor symptoms and follow-up as needed.   [place], Henderson Frampton 07/07/2015, 4:49 PM

## 2015-07-07 NOTE — Care Management Important Message (Signed)
Important Message  Patient Details  Name: Jennifer Lawson MRN: [id number] Date of Birth: 01-10-1961   Medicare Important Message Given:  Yes    Erenest Rasher, RN 07/07/2015, 12:31 PM

## 2015-07-07 NOTE — Progress Notes (Signed)
Patient arrived to unit per bed.  Reviewed treatment plan and this RN agrees.  Report received from bedside RN, Lauren.  Consent verified.  Patient A & O X 4. Lung sounds diminished to ausculation in all fields. Generalzied edema. Cardiac: Regular R&R.  Prepped RUAVF with alcohol and cannulated with two 15 gauge needles.  Pulsation of blood noted.  Flushed access well with saline per protocol.  Connected and secured lines and initiated tx at 1032.  UF goal of 3500 mL and net fluid removal of 3000 mL.  Will continue to monitor.

## 2015-07-07 NOTE — Progress Notes (Signed)
Pharmacy Antibiotic Note Jennifer Lawson is a 55 y.o. female admitted on 07/03/2015 with r/o pneumonia. Pharmacy has been consulted for levofloxacin dosing.   Note there was concern for aspiration last evening.   Plan: Levofloxacin 750mg  IV x1 tonight after HD, then 500mg  IV q48h Follow for clinical progression, LOT  Height: 5\' 6"  (167.6 cm) Weight: 192 lb 7.4 oz (87.3 kg) IBW/kg (Calculated) : 59.3  Temp (24hrs), Avg:98.6 F (37 C), Min:98.5 F (36.9 C), Max:98.8 F (37.1 C)   Recent Labs Lab 07/03/15 1430 07/03/15 1448 07/04/15 0534 07/06/15 0807 07/07/15 1030  WBC 4.8  --  3.7*  --  6.3  CREATININE 3.88*  --  4.42* 4.82* 5.51*  LATICACIDVEN  --  0.83  --   --   --     Estimated Creatinine Clearance: 12.8 mL/min (by C-G formula based on Cr of 5.51).    Allergies  Allergen Reactions  . Codeine Sulfate Anaphylaxis    Daughter called about having this allergy   . Gabapentin Other (See Comments) and Anaphylaxis    seizures  . Haldol [Haloperidol Lactate] Shortness Of Breath  . Risperidone And Related Shortness Of Breath  . Trazodone And Nefazodone Other (See Comments)    Makes pt lose balance and fall  . Depakote [Divalproex Sodium] Other (See Comments)    Nose bleeds-currently taking medication  . Haloperidol Other (See Comments)    hallucinations  . Invega [Paliperidone Er] Nausea And Vomiting  . Risperidone Other (See Comments)    Hallucinations?  Marland Kitchen Vistaril [Hydroxyzine Hcl] Nausea And Vomiting  . Hydrocodone-Acetaminophen Itching    Not allergic to aceetaminophen    Antimicrobials this admission: vanc 2/24>>2/26 zosyn 2/24>>2/28 tamiflu 2/24>>2/25 LVQ 2/28>>  Dose adjustments this admission: N/a  Microbiology results: 2/24 trach aspirate: ngtd 2/24 BCx2:nngtd 2/24 flu pcr: neg 2/24 MRSA pcr: neg 2/26 resp: metapneumovirus  Nathaneal Sommers D. Priscille Shadduck, PharmD, BCPS Clinical Pharmacist Pager: [phone] 07/07/2015 3:02 PM

## 2015-07-07 NOTE — Progress Notes (Signed)
SLP Cancellation Note  Patient Details Name: Jennifer Lawson MRN: [id number] DOB: 1960-12-05   Cancelled treatment:       Reason Eval/Treat Not Completed: Patient at procedure or test/unavailable.  SLP will follow up as able.   [name], M.A., CCC-SLP [phone]  [place] 07/07/2015, 2:17 PM

## 2015-07-07 NOTE — Progress Notes (Signed)
Nutrition Brief Note  Patient identified on the Malnutrition Screening Tool (MST) Report  Wt Readings from Last 15 Encounters:  07/07/15 192 lb 7.4 oz (87.3 kg)  06/11/15 179 lb 14.3 oz (81.6 kg)  06/06/15 191 lb 12.8 oz (87 kg)  04/17/15 182 lb 8.7 oz (82.8 kg)  01/29/15 179 lb 9.6 oz (81.466 kg)  01/13/15 197 lb 12 oz (89.7 kg)  07/31/14 188 lb 4.8 oz (85.412 kg)  05/19/14 182 lb 12.2 oz (82.9 kg)  04/12/14 203 lb (92.08 kg)  04/01/14 186 lb (84.369 kg)  03/12/14 184 lb (83.462 kg)  02/05/14 184 lb (83.462 kg)  01/20/14 192 lb 7.4 oz (87.3 kg)  09/24/13 195 lb (88.451 kg)  06/30/13 225 lb 8.5 oz (102.3 kg)   This is a 55 year old female w/ h/o ESRD and O2 dependent COPD. Per EDP had been ill feeling for about a week. Had been exposed to sick grandson. Symptoms included: productive cough, nasal congestion, subjective fever and progressive dyspnea over a 4-5d period. Presented to the ED on 2/24 w/ fever >101, and O2 sats in 70s. Developed progressive agitation, hypoxia was refractory so was intubated by EDP. PCCM asked to admit.   Pt off unit for procedure at time of visit. Unable to interview pt or perform nutrition-focused physical exam at this time.   Case discussed with RN. She reports pt with good appetite and tolerating modified diet textures well. Meal completion 25-100%. Per RN, pt ate at least 50% of her breakfast this morning. RN also shares that pt underwent rapid response overnight, due to potential aspiration.   SLP following; pt on nectar thickened liquids due to decreased airway protection and possible laryngeal edema.   Body mass index is 31.08 kg/(m^2). Patient meets criteria for obesity, class I based on current BMI.   Current diet order is Dysphagia 3, nectar thick liquids, patient is consuming approximately 25-100% of meals at this time. Labs and medications reviewed.   No nutrition interventions warranted at this time. If nutrition issues arise, please consult  RD.   Kasai Beltran A. Jimmye Norman, RD, LDN, CDE Pager: [phone] After hours Pager: [phone]

## 2015-07-07 NOTE — Progress Notes (Signed)
[place] Progress Note Patient Name: Jennifer Lawson DOB: [birth year] MRN: [id number]   Date of Service  07/07/2015  HPI/Events of Note  RN called re: ? Aspiration . Pt on dysphagia diet. 91% sats on 2L. Comfortable.   eICU Interventions  CXR in am. RN to call if worse.      Intervention Category Intermediate Interventions: Other:  [place] 07/07/2015, 1:46 AM

## 2015-07-07 NOTE — Progress Notes (Signed)
Rapid Response Nurse contacted to further assess patients' complaint of shortness of breath. Ativan given to help decrease anxiety.

## 2015-07-08 DIAGNOSIS — N186 End stage renal disease: Secondary | ICD-10-CM

## 2015-07-08 DIAGNOSIS — Z992 Dependence on renal dialysis: Secondary | ICD-10-CM

## 2015-07-08 LAB — CULTURE, BLOOD (ROUTINE X 2)
Culture: NO GROWTH
Culture: NO GROWTH

## 2015-07-08 MED ORDER — LEVOFLOXACIN 500 MG PO TABS: 500.0000 mg | ORAL_TABLET | Freq: Every day | ORAL | Status: DC

## 2015-07-08 MED ORDER — RENA-VITE PO TABS: 1.0000 | ORAL_TABLET | Freq: Every day | ORAL | Status: DC

## 2015-07-08 MED ORDER — PREDNISONE 10 MG PO TABS: ORAL_TABLET | ORAL | Status: DC

## 2015-07-08 MED ORDER — GUAIFENESIN ER 600 MG PO TB12: 600.0000 mg | ORAL_TABLET | Freq: Two times a day (BID) | ORAL | Status: DC

## 2015-07-08 MED ORDER — GUAIFENESIN ER 600 MG PO TB12
1200.0000 mg | ORAL_TABLET | Freq: Two times a day (BID) | ORAL | Status: DC
  Administered 2015-07-08: 1200 mg via ORAL
  Filled 2015-07-08: qty 2

## 2015-07-08 MED ORDER — DOXERCALCIFEROL 4 MCG/2ML IV SOLN: 3.0000 ug | INTRAVENOUS | Status: DC

## 2015-07-08 NOTE — Progress Notes (Signed)
Speech Language Pathology Treatment: Dysphagia  Patient Details Name: Jennifer Lawson MRN: [id number] DOB: [birth year] Today's Date: 07/08/2015 Time: 1255-1310 SLP Time Calculation (min) (ACUTE ONLY): 15 min  Assessment / Plan / Recommendation Clinical Impression  Pt eating lunch meal when entering room. Pt was observed to have no signs or symptoms of aspiration. Pt was able to state "she needs thickened liquids because she coughs with thin liquids and they go toward her lungs". Pt was instructed how to mix thickened liquids and where to buy the thickener. Pt is about to discharge home. It is recommended that [name] follows up with patient to address diet tolerance, aspiration precautions and diet advancement when able.           SLP Plan  Continue with current plan of care     Recommendations  Diet recommendations: Dysphagia 3 (mechanical soft);Nectar-thick liquid Medication Administration: Crushed with puree Supervision: Patient able to self feed Compensations: Minimize environmental distractions;Slow rate            Follow up Recommendations: Home health SLP Plan: Continue with current plan of care     GO           Functional Limitations: Swallowing Swallow Current Status [id number]): At least 20 percent but less than 40 percent impaired, limited or restricted Swallow Goal Status [phone]): At least 20 percent but less than 40 percent impaired, limited or restricted    Jennifer Lawson Jennifer Lawson 07/08/2015, 1:13 PM

## 2015-07-08 NOTE — Evaluation (Signed)
Occupational Therapy Evaluation Patient Details Name: Jennifer Lawson MRN: [id number] DOB: [birth year] Today's Date: 07/08/2015    History of Present Illness 55 year old female with history of ESRD on hemodialysis, oxygen dependent COPD who presented to the ED on 2/24 with aggressive dyspnea, subjective fever, cough and nasal congestion. She was intubated in the emergency room, managed in the intensive care unit by PCCM, extubated on 2/26. PMH includes mental disorder, depression, HTN, overdose, chronic kidney disease, PTSD, COPD, GERD, seizures, and Rt knee replacement.   Clinical Impression   Pt admitted with above. Pt getting assist with IADLs, PTA, but able to manage ADLs. Feel pt will benefit from acute OT to increase independence and reinforce energy conservation techniques prior to d/c. Recommending HHOT.    Follow Up Recommendations  Home health OT;Supervision - Intermittent    Equipment Recommendations  None recommended by OT    Recommendations for Other Services       Precautions / Restrictions Precautions Precautions: Fall Restrictions Weight Bearing Restrictions: No      Mobility Bed Mobility Overal bed mobility: Modified Independent                Transfers Overall transfer level: Needs assistance   Transfers: Sit to/from Stand Sit to Stand: Supervision (and set up to manage O2 line)              Balance       LOB with single leg stance when simulating functional activity while standing.                                       ADL Overall ADL's : Needs assistance/impaired             Lower Body Bathing: Sit to/from stand;Set up;Supervison/ safety       Lower Body Dressing: Sit to/from stand;Set up;Supervision/safety   Toilet Transfer: Min guard;Ambulation (sit to stand from bed)           Functional mobility during ADLs: Min guard General ADL Comments: Educated on safety such as sitting for LB ADLs and educated  on energy conservation.     Vision     Perception     Praxis      Pertinent Vitals/Pain Pain Assessment: 0-10 Pain Score: 8  Pain Location: lower abdomen(ovaries) and back Pain Intervention(s): Monitored during session;Repositioned     Hand Dominance     Extremity/Trunk Assessment Upper Extremity Assessment Upper Extremity Assessment: Generalized weakness   Lower Extremity Assessment Lower Extremity Assessment: Defer to PT evaluation       Communication Communication Communication: No difficulties   Cognition Arousal/Alertness: Awake/alert Behavior During Therapy: WFL for tasks assessed/performed Overall Cognitive Status: No family/caregiver present to determine baseline cognitive functioning (slow processing and decreased attention(focused-sustained)                     General Comments       Exercises       Shoulder Instructions      Home Living Family/patient expects to be discharged to:: Private residence Living Arrangements: Children Available Help at Discharge: Family;Available PRN/intermittently Type of Home: House Home Access: Level entry (reports she has a hill to manage)     Home Layout: One level     Bathroom Shower/Tub: Tub/shower unit         Home Equipment: Environmental consultant - 2 wheels;Shower seat;Bedside commode  Prior Functioning/Environment Level of Independence: Needs assistance    ADL's / Homemaking Assistance Needed: assist with cooking and cleaning        OT Diagnosis: Generalized weakness   OT Problem List: Decreased strength;Decreased activity tolerance;Impaired balance (sitting and/or standing);Decreased cognition;Pain   OT Treatment/Interventions: Self-care/ADL training;Therapeutic activities;Cognitive remediation/compensation;Balance training;Patient/family education;Therapeutic exercise;DME and/or AE instruction;Energy conservation    OT Goals(Current goals can be found in the care plan section) Acute Rehab  OT Goals Patient Stated Goal: not stated OT Goal Formulation: With patient Time For Goal Achievement: 07/15/15 Potential to Achieve Goals: Good ADL Goals Pt Will Perform Lower Body Dressing: sit to/from stand;with modified independence (including gathering items) Pt Will Transfer to Toilet: with modified independence;ambulating Additional ADL Goal #1: Pt will independently verbalize 3/3 energy conservation techniques and utilize in session as needed.  OT Frequency: Min 2X/week   Barriers to D/C:            Co-evaluation              End of Session Equipment Utilized During Treatment: Gait belt;Oxygen  Activity Tolerance: Patient limited by fatigue Patient left: with bed alarm set;with call bell/phone within reach;in bed   Time: [id number] OT Time Calculation (min): 20 min Charges:  OT General Charges $OT Visit: 1 Procedure OT Evaluation $OT Eval Moderate Complexity: 1 Procedure G-CodesBenito Mccreedy OTR/L [id number] 07/08/2015, 11:09 AM

## 2015-07-08 NOTE — Discharge Summary (Signed)
PATIENT DETAILS Name: Jennifer Lawson Age: 55 y.o. Sex: female Date of Birth: August 27, 1960 MRN: [id number]. Admitting Physician: Chesley Mires, MD [id number] [facility]  Admit Date: 07/03/2015 Discharge date: 07/08/2015  Recommendations for Outpatient Follow-up:  1. Please repeat Chest Xray in 4-6 weeks to ensure resolution of the infiltrate  2. Please repeat CBC/BMET at next visit 3. Please follow blood cultures till final  PRIMARY DISCHARGE DIAGNOSIS:  Active Problems:   Acute respiratory failure (HCC)   Sepsis ([place])      PAST MEDICAL HISTORY: Past Medical History  Diagnosis Date  . Mental disorder   . Depression   . Hypertension   . Overdose   . Tobacco use disorder 11/13/2012  . Complication of anesthesia     difficulty going to sleep  . Chronic kidney disease     06/11/13- not on dialysis  . Shortness of breath     lying down flat  . PTSD (post-traumatic stress disorder)   . Asthma   . COPD (chronic obstructive pulmonary disease) ([place])   . Heart murmur   . GERD (gastroesophageal reflux disease)   . Seizures ([place])     "passsed out"  . History of blood transfusion   . Diabetes mellitus without complication ([place])     denies    DISCHARGE MEDICATIONS: Current Discharge Medication List    START taking these medications   Details  guaiFENesin (MUCINEX) 600 MG 12 hr tablet Take 1 tablet (600 mg total) by mouth 2 (two) times daily. Qty: 15 tablet, Refills: 0    levofloxacin (LEVAQUIN) 500 MG tablet Take 1 tablet (500 mg total) by mouth daily. Qty: 1 tablet, Refills: 0    predniSONE (DELTASONE) 10 MG tablet Take 4 tablets (40 mg) daily for 2 days, then, Take 3 tablets (30 mg) daily for 2 days, then, Take 2 tablets (20 mg) daily for 2 days, then, Take 1 tablets (10 mg) daily for 1 days, then stop Qty: 19 tablet, Refills: 0      CONTINUE these medications which have NOT CHANGED   Details  albuterol (PROVENTIL HFA;VENTOLIN HFA) 108 (90 Base) MCG/ACT inhaler  Inhale 2 puffs into the lungs every 6 (six) hours as needed for wheezing or shortness of breath. Qty: 1 Inhaler, Refills: 0    albuterol (PROVENTIL) (2.5 MG/3ML) 0.083% nebulizer solution Take 2.5 mg by nebulization every 6 (six) hours as needed for wheezing or shortness of breath.    b complex-vitamin c-folic acid (NEPHRO-VITE) 0.8 MG TABS tablet Take 1 tablet by mouth daily.    cinacalcet (SENSIPAR) 60 MG tablet Take 60 mg by mouth daily. Takes with largest meal once daily    !! divalproex (DEPAKOTE ER) 500 MG 24 hr tablet Take 2 tablets (1,000 mg total) by mouth at bedtime. Qty: 60 tablet, Refills: 0    !! divalproex (DEPAKOTE ER) 500 MG 24 hr tablet Take 1 tablet (500 mg total) by mouth every morning. Qty: 30 tablet, Refills: 0    Docusate Sodium (DSS) 100 MG CAPS Take 100 mg by mouth 2 (two) times daily. Qty: 60 each, Refills: 0    doxepin (SINEQUAN) 25 MG capsule Take 1 capsule (25 mg total) by mouth at bedtime. Qty: 30 capsule, Refills: 0    hydrocortisone (ANUSOL-HC) 2.5 % rectal cream Place rectally 2 (two) times daily. Qty: 30 g, Refills: 0    LORazepam (ATIVAN) 0.5 MG tablet Take 0.5 mg by mouth every 8 (eight) hours as needed for anxiety or sleep.  metoprolol tartrate (LOPRESSOR) 25 MG tablet Take 1 tablet (25 mg total) by mouth 2 (two) times daily. Qty: 60 tablet, Refills: 0    mometasone-formoterol (DULERA) 100-5 MCG/ACT AERO Inhale 2 puffs into the lungs 2 (two) times daily. Qty: 1 Inhaler, Refills: 0    pantoprazole (PROTONIX) 40 MG tablet Take 1 tablet (40 mg total) by mouth daily. Qty: 30 tablet, Refills: 0    sevelamer carbonate (RENVELA) 800 MG tablet Take 1,600 mg by mouth 3 (three) times daily with meals. Takes 2 tabs with each meal and snacks as directed    simvastatin (ZOCOR) 10 MG tablet Take 1 tablet (10 mg total) by mouth at bedtime. Reported on 05/30/2015 Qty: 30 tablet, Refills: 0    ziprasidone (GEODON) 60 MG capsule Take 1 capsule (60 mg total)  by mouth 2 (two) times daily with a meal. For mood control Qty: 60 capsule, Refills: 0    bisacodyl (DULCOLAX) 5 MG EC tablet Take 1 tablet (5 mg total) by mouth daily as needed for moderate constipation. Qty: 30 tablet, Refills: 0     !! - Potential duplicate medications found. Please discuss with provider.      ALLERGIES:   Allergies  Allergen Reactions  . Codeine Sulfate Anaphylaxis    Daughter called about having this allergy   . Gabapentin Other (See Comments) and Anaphylaxis    seizures  . Haldol [Haloperidol Lactate] Shortness Of Breath  . Risperidone And Related Shortness Of Breath  . Trazodone And Nefazodone Other (See Comments)    Makes pt lose balance and fall  . Depakote [Divalproex Sodium] Other (See Comments)    Nose bleeds-currently taking medication  . Haloperidol Other (See Comments)    hallucinations  . Invega [Paliperidone Er] Nausea And Vomiting  . Risperidone Other (See Comments)    Hallucinations?  Marland Kitchen Vistaril [Hydroxyzine Hcl] Nausea And Vomiting  . Hydrocodone-Acetaminophen Itching    Not allergic to aceetaminophen    BRIEF HPI:  See H&P, Labs, Consult and Test reports for all details in brief, 55 year old female with history of ESRD on hemodialysis, oxygen dependent COPD who presented to the ED on 2/24 with aggressive dyspnea, subjective fever, cough and nasal congestion. She was intubated in the emergency room, managed in the intensive care unit by PCCM  CONSULTATIONS:   pulmonary/intensive care and ENT  rENAL  PERTINENT RADIOLOGIC STUDIES: Dg Chest 1 View  06/10/2015  CLINICAL DATA:  Altered mental status.  Hypertension and COPD. EXAM: CHEST 1 VIEW COMPARISON:  01/28/2015 FINDINGS: Cardiac silhouette is markedly enlarged. The aorta shows ectasia and tortuosity. There is pulmonary venous hypertension, possibly with early interstitial edema. No frank pulmonary edema. No effusions. No infiltrate, collapse or effusion. IMPRESSION: Cardiomegaly.  Pulmonary venous hypertension. Question early interstitial edema. Electronically Signed   By: Nelson Chimes M.D.   On: 06/10/2015 16:08   Dg Chest 2 View  07/03/2015  CLINICAL DATA:  Shortness of breath today, smoker, cough, congestion, hypertension, COPD, chronic kidney disease, diabetes mellitus EXAM: CHEST  2 VIEW COMPARISON:  06/09/2005 FINDINGS: Enlargement of cardiac silhouette with pulmonary vascular congestion. Calcification and tortuosity of thoracic aorta. Increased markings in the mid to lower lungs bilaterally likely reflects subtle infiltrate, favor edema over infection. Minimal basilar atelectasis. No gross pleural effusion or pneumothorax. Bones unremarkable. IMPRESSION: Enlargement of cardiac silhouette with pulmonary vascular congestion and probable subtle infiltrates in the mid to lower lungs, favor mild edema. Electronically Signed   By: Lavonia Dana M.D.   On: 07/03/2015 15:01  Dg Chest Port 1 View  07/07/2015  CLINICAL DATA:  Shortness of Breath EXAM: PORTABLE CHEST 1 VIEW COMPARISON:  07/04/2015 FINDINGS: Cardiac shadow is enlarged in size. The lungs are well aerated bilaterally. Mild central vascular congestion is again seen. The endotracheal tube and nasogastric catheter have been removed in the interval. No focal infiltrate is seen. IMPRESSION: Mild vascular congestion.  No focal infiltrate noted. Electronically Signed   By: Inez Catalina M.D.   On: 07/07/2015 07:44   Dg Chest Port 1 View  07/04/2015  CLINICAL DATA:  Acute respiratory failure EXAM: PORTABLE CHEST 1 VIEW COMPARISON:  07/03/2015 FINDINGS: Endotracheal tube tip 2.1 cm above the carina. Nasogastric tube enters the stomach. Mild enlargement of the cardiopericardial silhouette with indistinct pulmonary vasculature. Mild atherosclerotic calcification of the aortic arch. Linear subsegmental atelectasis in the left mid lung. Indistinctness of part of the left hemidiaphragm could reflect mild left lower lobe atelectasis. Mildly  improved opacity medially at the right lung base. IMPRESSION: 1. The endotracheal tube has been retracted and is in a satisfactorily position with tip 2.1 cm above the carina. 2. Subtle basilar and left mid lung opacities. 3. Mild enlargement of the cardiopericardial silhouette. Electronically Signed   By: Van Clines M.D.   On: 07/04/2015 08:37   Dg Chest Portable 1 View  07/03/2015  CLINICAL DATA:  Intubation. EXAM: PORTABLE CHEST 1 VIEW COMPARISON:  Earlier the same day FINDINGS: 1640 hours. Endotracheal tube tip is in the right mainstem bronchus. Endotracheal tube could be pulled back approximately 2.5 cm for more appropriate positioning. The NG tube passes into the stomach although the distal tip position is not included on the film. The cardio pericardial silhouette is enlarged. Bibasilar airspace disease is more prominent now. The visualized bony structures of the thorax are intact. Telemetry leads overlie the chest. IMPRESSION: 1. Right mainstem intubation. Endotracheal tube could be pulled back about 2.5 cm for more appropriate positioning. 2. Interval increase in bibasilar airspace disease raising concern for progressive edema given the timeframe. Superimposed infection remains a consideration. Results were called directly to Dr. Audie Pinto at approximately 1700 hours on 07/03/2015. Electronically Signed   By: [name] Stanley M.D.   On: 07/03/2015 17:08     PERTINENT LAB RESULTS: CBC:  Recent Labs  07/07/15 1030  WBC 6.3  HGB 10.4*  HCT 34.5*  PLT 211   CMET CMP     Component Value Date/Time   NA 139 07/07/2015 1030   K 4.2 07/07/2015 1030   CL 102 07/07/2015 1030   CO2 27 07/07/2015 1030   GLUCOSE 76 07/07/2015 1030   BUN 57* 07/07/2015 1030   CREATININE 5.51* 07/07/2015 1030   CALCIUM 8.5* 07/07/2015 1030   PROT 6.9 07/03/2015 1430   ALBUMIN 2.5* 07/07/2015 1030   AST 19 07/03/2015 1430   ALT 9* 07/03/2015 1430   ALKPHOS 113 07/03/2015 1430   BILITOT 0.5 07/03/2015  1430   GFRNONAA 8* 07/07/2015 1030   GFRAA 9* 07/07/2015 1030    GFR Estimated Creatinine Clearance: 12.8 mL/min (by C-G formula based on Cr of 5.51). No results for input(s): LIPASE, AMYLASE in the last 72 hours. No results for input(s): CKTOTAL, CKMB, CKMBINDEX, TROPONINI in the last 72 hours. Invalid input(s): POCBNP No results for input(s): DDIMER in the last 72 hours. No results for input(s): HGBA1C in the last 72 hours. No results for input(s): CHOL, HDL, LDLCALC, TRIG, CHOLHDL, LDLDIRECT in the last 72 hours. No results for input(s): TSH, T4TOTAL, T3FREE, THYROIDAB  in the last 72 hours.  Invalid input(s): FREET3 No results for input(s): VITAMINB12, FOLATE, FERRITIN, TIBC, IRON, RETICCTPCT in the last 72 hours. Coags: No results for input(s): INR in the last 72 hours.  Invalid input(s): PT Microbiology: Recent Results (from the past 240 hour(s))  Blood Culture (routine x 2)     Status: None (Preliminary result)   Collection Time: 07/03/15  2:30 PM  Result Value Ref Range Status   Specimen Description BLOOD LEFT FOREARM  Final   Special Requests BOTTLES DRAWN AEROBIC AND ANAEROBIC 5CC  Final   Culture NO GROWTH 4 DAYS  Final   Report Status PENDING  Incomplete  Culture, respiratory (NON-Expectorated)     Status: None   Collection Time: 07/03/15  6:20 PM  Result Value Ref Range Status   Specimen Description TRACHEAL ASPIRATE  Final   Special Requests Normal  Final   Gram Stain   Final    MODERATE WBC PRESENT,BOTH PMN AND MONONUCLEAR RARE SQUAMOUS EPITHELIAL CELLS PRESENT NO ORGANISMS SEEN Performed at [facility]    Culture   Final    NORMAL OROPHARYNGEAL FLORA Performed at [facility]    Report Status 07/06/2015 FINAL  Final  MRSA PCR Screening     Status: None   Collection Time: 07/03/15  7:14 PM  Result Value Ref Range Status   MRSA by PCR NEGATIVE NEGATIVE Final    Comment:        The GeneXpert MRSA Assay (FDA approved for NASAL  specimens only), is one component of a comprehensive MRSA colonization surveillance program. It is not intended to diagnose MRSA infection nor to guide or monitor treatment for MRSA infections.   Blood Culture (routine x 2)     Status: None (Preliminary result)   Collection Time: 07/03/15  8:00 PM  Result Value Ref Range Status   Specimen Description BLOOD RIGHT HAND  Final   Special Requests BOTTLES DRAWN AEROBIC ONLY 8CC  Final   Culture NO GROWTH 4 DAYS  Final   Report Status PENDING  Incomplete  Respiratory virus panel     Status: Abnormal   Collection Time: 07/05/15  5:57 PM  Result Value Ref Range Status   Respiratory Syncytial Virus A Negative Negative Final   Respiratory Syncytial Virus B Negative Negative Final   Influenza A Negative Negative Final   Influenza B Negative Negative Final   Parainfluenza 1 Negative Negative Final   Parainfluenza 2 Negative Negative Final   Parainfluenza 3 Negative Negative Final   Metapneumovirus Positive (A) Negative Final   Rhinovirus Negative Negative Final   Adenovirus Negative Negative Final    Comment: (NOTE) Performed At: [facility] [place], [place] [id number] [place] [id number]      [place] COURSE:  Acute on chronic hypoxic respiratory failure: Secondary to pneumonia, acute exacerbation of COPD and possible mild laryngeal edema. Intubated on admission, extubated on 12/26. Now back on 2 L via nasal cannula-which is her home regimen.  Healthcare associated pneumonia: Empirically managed with vancomycin and Zosyn. Blood cultures negative, influenza panel negative, respiratory virus panel positive for metapneumonia virus.She was initially managed with empiricvancomycin and Zosyn-subsequently narrowed down to levofloxacin-plan is to a seven-day course of antibiotics.By day of discharge-much improved, afebrile, no leukocytosis-and requesting discharge. SOB is back to her usual baseline.    Hoarseness of voice: Occurred Post extubation-no response to steroids-no stridor on exam- ENT consulted-underwent a direct laryngoscopic evaluation-which showed moderate soft tissue edema consistent with  recent extubation-recommendations from ENT are to continue supportive care-hopeful for improvement in symptoms over time with return to normal voice over the next several weeks  Dysphagia: Continue-dysphagia 3 diet on dyscharge-patient aware of mild aspiration risk. SLP follow up at home.   Hypertension: Controlled, continue metoprolol  Dyslipidemia: Continue statin  End-stage renal disease: Nephrology followed patient throughout the hospital stay, undergoes hemodialysis on Tuesdays, Wednesdays and Saturdays.  Acute encephalopathy: Secondary to pneumonia/hypoxia. Resolved.  History of bipolar disorder: Appears stable, continue Depakote, doxepin and Geodon.  Tobacco abuse: Counseled  Deconditioning:due to acute illness-HHPT services ordered.  TODAY-DAY OF DISCHARGE:  Subjective:   Akelia Laughton today has no headache,no chest abdominal pain,no new weakness tingling or numbness, feels much better wants to go home today.   Objective:   Blood pressure 118/74, pulse 72, temperature 98.4 F (36.9 C), temperature source Oral, resp. rate 17, height 5\' 6"  (1.676 m), weight 87.3 kg (192 lb 7.4 oz), last menstrual period 05/09/2008, SpO2 93 %.  Intake/Output Summary (Last 24 hours) at 07/08/15 1049 Last data filed at 07/08/15 0918  Gross per 24 hour  Intake    360 ml  Output      0 ml  Net    360 ml   Filed Weights   07/06/15 0637 07/07/15 0645 07/07/15 1022  Weight: 82 kg (180 lb 12.4 oz) 85.9 kg (189 lb 6 oz) 87.3 kg (192 lb 7.4 oz)    Exam Awake Alert, Oriented *3, No new F.N deficits, Normal affect [place].AT,PERRAL Supple Neck,No JVD, No cervical lymphadenopathy appriciated.  Symmetrical Chest wall movement, Good air movement bilaterally, CTAB RRR,No Gallops,Rubs or new  Murmurs, No Parasternal Heave +ve B.Sounds, Abd Soft, Non tender, No organomegaly appriciated, No rebound -guarding or rigidity. No Cyanosis, Clubbing or edema, No new Rash or bruise  DISCHARGE CONDITION: Stable  DISPOSITION: Home with home health services  DISCHARGE INSTRUCTIONS:    Activity:  As tolerated with Full fall precautions use walker/cane & assistance as needed  Get Medicines reviewed and adjusted: Please take all your medications with you for your next visit with your Primary MD  Please request your Primary MD to go over all hospital tests and procedure/radiological results at the follow up, please ask your Primary MD to get all Hospital records sent to his/her office.  If you experience worsening of your admission symptoms, develop shortness of breath, life threatening emergency, suicidal or homicidal thoughts you must seek medical attention immediately by calling 911 or calling your MD immediately  if symptoms less severe.  You must read complete instructions/literature along with all the possible adverse reactions/side effects for all the Medicines you take and that have been prescribed to you. Take any new Medicines after you have completely understood and accpet all the possible adverse reactions/side effects.   Do not drive when taking Pain medications.   Do not take more than prescribed Pain, Sleep and Anxiety Medications  Special Instructions: If you have smoked or chewed Tobacco  in the last 2 yrs please stop smoking, stop any regular Alcohol  and or any Recreational drug use.  Wear Seat belts while driving.  Please note  You were cared for by a hospitalist during your hospital stay. Once you are discharged, your primary care physician will handle any further medical issues. Please note that NO REFILLS for any discharge medications will be authorized once you are discharged, as it is imperative that you return to your primary care physician (or establish a  relationship with a  primary care physician if you do not have one) for your aftercare needs so that they can reassess your need for medications and monitor your lab values.   Diet recommendation: Dysphagia 3 diet  Discharge Instructions    Call MD for:  difficulty breathing, headache or visual disturbances    Complete by:  As directed      Call MD for:  temperature >100.4    Complete by:  As directed      Diet - low sodium heart healthy    Complete by:  As directed      Increase activity slowly    Complete by:  As directed            Follow-up Information    Follow up with [facility]. Schedule an appointment as soon as possible for a visit in 1 week.   Specialty:  Internal Medicine   Why:  Hospital follow up   Contact information:   [address] [place] [address] [phone]       Please follow up.   Contact information:   [facility]      Total Time spent on discharge equals  45 minutes.  SignedOren Binet 07/08/2015 10:49 AM

## 2015-07-08 NOTE — Care Management Note (Signed)
Case Management Note  Patient Details  Name: Jennifer Lawson MRN: [id number] Date of Birth: [birth year]  Subjective/Objective:                    Action/Plan:  Patient requesting Rolator , NEB machine same ordered. Patient has home oxygen through [place] and needs portable oxygen tank for discharge today .  Expected Discharge Date:                  Expected Discharge Plan:  [place]  In-House Referral:     Discharge planning Services  CM Consult  Post Acute Care Choice:  Durable Medical Equipment, Home Health Choice offered to:  Patient  DME Arranged:  Oxygen, Walker rolling with seat, Nebulizer/meds DME Agency:  [place]:  RN, PT, Speech Therapy HH Agency:  [place]  Status of Service:  Completed, signed off  Medicare Important Message Given:  Yes Date Medicare IM Given:    Medicare IM give by:    Date Additional Medicare IM Given:    Additional Medicare Important Message give by:     If discussed at [place] of Stay Meetings, dates discussed:    Additional Comments:  Marilu Favre, RN 07/08/2015, 11:05 AM

## 2015-07-08 NOTE — Progress Notes (Signed)
[facility] Progress Note  Assessment/Plan: 55 year old black female with ESRD who presents with a pulmonary infection type history with productive cough and fever whose symptoms escalated in the emergency department requiring intubation for hypoxia and hypercarbia  1 pulmonary- history suggestive of an infectious etiology. Progressive symptoms requiring intubation.  steroids, Initially Zosyn and vancomycin changed to levaquin; off Tamiflu- extubated on 2/25 doing much better ; + metapneumovirus  2 ESRD:  TTS  needed benzo but hemodynamically tolerated well- plan for nest tx on  Thursday  3 Hypertension:  BP ok but volume up by weights yesterday; net UF 2.5 yesterday - no post HD weight; CXR Tuesday am pre HD showed mild vasc congestion 4. Anemia of ESRD: Fairly well controlled. Received Mircera on 2/22 Hgb 10.4 5. Metabolic Bone Disease: resume her Sensipar and Renvela/resume Hectorol - poorly controlled 6. Psych issues - seem pretty controlled 7. Nutrition alb 2.5- D3 diet + renavite; noted that she got 2 V-8 on tray this am but didn't drink -VERY high in K- added special attention not to diet order; she cannot understand the difference between P and K;   Myriam Jacobson, PA-C [place] 07/08/2015,10:50 AM  LOS: 5 days   Subjective:   No problems with HD.  May questions.  How are my kidneys? Am I going to die?  Objective Filed Vitals:   07/07/15 2219 07/07/15 2221 07/08/15 0651 07/08/15 0849  BP: 123/74 123/76 118/74   Pulse: 86 90 72   Temp: 98.5 F (36.9 C)  98.4 F (36.9 C)   TempSrc: Oral  Oral   Resp: 19  17   Height:      Weight:      SpO2: 96%  96% 93%   Physical Exam General: NAD on O2, looks pretty good Heart: RRR Lungs: crackles left base; soft exp wheezes Abdomen: soft NT Extremities: tr LLE edema Dialysis Access: right upper AVF + bruit  Dialysis Orders: [place] EDW 83 kg- got to 84 kg PTA HD [place] 2K/2 calcium,  Dialyzer 180, Heparin no. Access AV fistula right upper arm. Hectorol 3 g every treatment also on Sensipar 60 and Renvela 1600 3 times a day last calcium 8, phosphorus 5.1, PTH 1739 Received Mircera on 2/22 last hemoglobin 10.5  Additional Objective Labs: Basic Metabolic Panel:  Recent Labs Lab 07/04/15 0534 07/06/15 0807 07/07/15 1030  NA 141 137 139  K 4.3 4.6 4.2  CL 103 98* 102  CO2 22 26 27   GLUCOSE 86 116* 76  BUN 33* 39* 57*  CREATININE 4.42* 4.82* 5.51*  CALCIUM 9.4 8.1* 8.5*  PHOS 5.4* 5.9* 5.7*   Liver Function Tests:  Recent Labs Lab 07/03/15 1430 07/04/15 0534 07/06/15 0807 07/07/15 1030  AST 19  --   --   --   ALT 9*  --   --   --   ALKPHOS 113  --   --   --   BILITOT 0.5  --   --   --   PROT 6.9  --   --   --   ALBUMIN 2.6* 2.3* 2.3* 2.5*   CBC:  Recent Labs Lab 07/03/15 1430 07/04/15 0534 07/07/15 1030  WBC 4.8 3.7* 6.3  NEUTROABS 3.2  --   --   HGB 11.0* 9.8* 10.4*  HCT 37.6 33.1* 34.5*  MCV 101.3* 99.1 99.7  PLT 124* 127* 211   Blood Culture    Component Value Date/Time   SDES BLOOD RIGHT HAND  07/03/2015 2000   SPECREQUEST BOTTLES DRAWN AEROBIC ONLY 8CC 07/03/2015 2000   CULT NO GROWTH 4 DAYS 07/03/2015 2000   REPTSTATUS PENDING 07/03/2015 2000   CBG:  Recent Labs Lab 07/05/15 1233 07/05/15 1652 07/05/15 2157 07/06/15 0848 07/06/15 1248  GLUCAP 124* 163* 130* 95 111*    Studies/Results: Dg Chest Port 1 View  07/07/2015  CLINICAL DATA:  Shortness of Breath EXAM: PORTABLE CHEST 1 VIEW COMPARISON:  07/04/2015 FINDINGS: Cardiac shadow is enlarged in size. The lungs are well aerated bilaterally. Mild central vascular congestion is again seen. The endotracheal tube and nasogastric catheter have been removed in the interval. No focal infiltrate is seen. IMPRESSION: Mild vascular congestion.  No focal infiltrate noted. Electronically Signed   By: Inez Catalina M.D.   On: 07/07/2015 07:44   Medications:   . albuterol  2.5 mg  Nebulization QID  . cinacalcet  60 mg Oral Q supper  . divalproex  1,000 mg Oral QHS  . divalproex  500 mg Oral q morning - 10a  . doxepin  25 mg Oral QHS  . guaiFENesin  1,200 mg Oral BID  . heparin subcutaneous  5,000 Units Subcutaneous 3 times per day  . [START ON 07/09/2015] levofloxacin (LEVAQUIN) IV  500 mg Intravenous Q48H  . metoprolol tartrate  25 mg Oral BID  . mometasone-formoterol  2 puff Inhalation BID  . nicotine  14 mg Transdermal Daily  . pantoprazole  40 mg Oral Q1200  . sevelamer carbonate  1,600 mg Oral TID WC  . simvastatin  10 mg Oral QHS  . ziprasidone  60 mg Oral BID WC

## 2015-07-11 ENCOUNTER — Emergency Department (HOSPITAL_COMMUNITY): Payer: Medicare Other

## 2015-07-11 ENCOUNTER — Encounter (HOSPITAL_COMMUNITY): Payer: Self-pay | Admitting: *Deleted

## 2015-07-11 ENCOUNTER — Non-Acute Institutional Stay (HOSPITAL_COMMUNITY)
Admission: EM | Admit: 2015-07-11 | Discharge: 2015-07-13 | Disposition: A | Discharge status: Other Acute Inpatient Hospital | Payer: Medicare Other | Attending: Emergency Medicine | Admitting: Emergency Medicine

## 2015-07-11 DIAGNOSIS — Z992 Dependence on renal dialysis: Secondary | ICD-10-CM | POA: Insufficient documentation

## 2015-07-11 DIAGNOSIS — Z96651 Presence of right artificial knee joint: Secondary | ICD-10-CM | POA: Insufficient documentation

## 2015-07-11 DIAGNOSIS — E1122 Type 2 diabetes mellitus with diabetic chronic kidney disease: Secondary | ICD-10-CM | POA: Insufficient documentation

## 2015-07-11 DIAGNOSIS — Z7951 Long term (current) use of inhaled steroids: Secondary | ICD-10-CM | POA: Insufficient documentation

## 2015-07-11 DIAGNOSIS — K219 Gastro-esophageal reflux disease without esophagitis: Secondary | ICD-10-CM | POA: Insufficient documentation

## 2015-07-11 DIAGNOSIS — Z79899 Other long term (current) drug therapy: Secondary | ICD-10-CM | POA: Diagnosis not present

## 2015-07-11 DIAGNOSIS — J45909 Unspecified asthma, uncomplicated: Secondary | ICD-10-CM | POA: Insufficient documentation

## 2015-07-11 DIAGNOSIS — F319 Bipolar disorder, unspecified: Secondary | ICD-10-CM | POA: Insufficient documentation

## 2015-07-11 DIAGNOSIS — J449 Chronic obstructive pulmonary disease, unspecified: Secondary | ICD-10-CM | POA: Insufficient documentation

## 2015-07-11 DIAGNOSIS — R4182 Altered mental status, unspecified: Secondary | ICD-10-CM | POA: Diagnosis present

## 2015-07-11 DIAGNOSIS — F4310 Post-traumatic stress disorder, unspecified: Secondary | ICD-10-CM | POA: Insufficient documentation

## 2015-07-11 DIAGNOSIS — N186 End stage renal disease: Secondary | ICD-10-CM | POA: Insufficient documentation

## 2015-07-11 DIAGNOSIS — F17210 Nicotine dependence, cigarettes, uncomplicated: Secondary | ICD-10-CM | POA: Insufficient documentation

## 2015-07-11 DIAGNOSIS — I120 Hypertensive chronic kidney disease with stage 5 chronic kidney disease or end stage renal disease: Secondary | ICD-10-CM | POA: Insufficient documentation

## 2015-07-11 DIAGNOSIS — Z8659 Personal history of other mental and behavioral disorders: Secondary | ICD-10-CM

## 2015-07-11 DIAGNOSIS — F312 Bipolar disorder, current episode manic severe with psychotic features: Secondary | ICD-10-CM | POA: Diagnosis not present

## 2015-07-11 LAB — COMPREHENSIVE METABOLIC PANEL
ALT: 15 U/L (ref 14–54)
AST: 20 U/L (ref 15–41)
Albumin: 3.2 g/dL — ABNORMAL LOW (ref 3.5–5.0)
Alkaline Phosphatase: 104 U/L (ref 38–126)
Anion gap: 16 — ABNORMAL HIGH (ref 5–15)
BUN: 67 mg/dL — ABNORMAL HIGH (ref 6–20)
CO2: 25 mmol/L (ref 22–32)
Calcium: 9.8 mg/dL (ref 8.9–10.3)
Chloride: 102 mmol/L (ref 101–111)
Creatinine, Ser: 5.97 mg/dL — ABNORMAL HIGH (ref 0.44–1.00)
GFR calc Af Amer: 8 mL/min — ABNORMAL LOW (ref >60)
GFR calc non Af Amer: 7 mL/min — ABNORMAL LOW (ref >60)
Glucose, Bld: 80 mg/dL (ref 65–99)
Potassium: 3.7 mmol/L (ref 3.5–5.1)
Sodium: 143 mmol/L (ref 135–145)
Total Bilirubin: 0.7 mg/dL (ref 0.3–1.2)
Total Protein: 7.1 g/dL (ref 6.5–8.1)

## 2015-07-11 LAB — ETHANOL: Alcohol, Ethyl (B): <5 mg/dL (ref <5)

## 2015-07-11 LAB — CBC
HCT: 34.7 % — ABNORMAL LOW (ref 36.0–46.0)
Hemoglobin: 10.6 g/dL — ABNORMAL LOW (ref 12.0–15.0)
MCH: 29.6 pg (ref 26.0–34.0)
MCHC: 30.5 g/dL (ref 30.0–36.0)
MCV: 96.9 fL (ref 78.0–100.0)
Platelets: 372 10*3/uL (ref 150–400)
RBC: 3.58 MIL/uL — ABNORMAL LOW (ref 3.87–5.11)
RDW: 20.3 % — ABNORMAL HIGH (ref 11.5–15.5)
WBC: 7 10*3/uL (ref 4.0–10.5)

## 2015-07-11 LAB — URINE MICROSCOPIC-ADD ON

## 2015-07-11 LAB — SAMPLE TO BLOOD BANK

## 2015-07-11 LAB — URINALYSIS, ROUTINE W REFLEX MICROSCOPIC
Bilirubin Urine: NEGATIVE
Glucose, UA: NEGATIVE mg/dL
Ketones, ur: NEGATIVE mg/dL
Leukocytes, UA: NEGATIVE
Nitrite: NEGATIVE
Protein, ur: >300 mg/dL — AB
Specific Gravity, Urine: 1.012 (ref 1.005–1.030)
pH: 6.5 (ref 5.0–8.0)

## 2015-07-11 LAB — PROTIME-INR
INR: 1.04 (ref 0.00–1.49)
Prothrombin Time: 13.8 seconds (ref 11.6–15.2)

## 2015-07-11 LAB — RAPID URINE DRUG SCREEN, HOSP PERFORMED
Amphetamines: NOT DETECTED
Barbiturates: NOT DETECTED
Benzodiazepines: POSITIVE — AB
Cocaine: NOT DETECTED
Opiates: NOT DETECTED
Tetrahydrocannabinol: NOT DETECTED

## 2015-07-11 LAB — CDS SEROLOGY

## 2015-07-11 MED ORDER — ZIPRASIDONE MESYLATE 20 MG IM SOLR
20.0000 mg | Freq: Once | INTRAMUSCULAR | Status: AC
  Administered 2015-07-11: 20 mg via INTRAMUSCULAR
  Filled 2015-07-11: qty 20

## 2015-07-11 MED ORDER — STERILE WATER FOR INJECTION IJ SOLN
1.2000 mL | Freq: Once | INTRAMUSCULAR | Status: AC
  Administered 2015-07-11: 1.2 mL via INTRAMUSCULAR

## 2015-07-11 MED ORDER — STERILE WATER FOR INJECTION IJ SOLN
INTRAMUSCULAR | Status: AC
Start: 2015-07-11 — End: 2015-07-12
  Filled 2015-07-11: qty 10

## 2015-07-11 MED ORDER — HALOPERIDOL LACTATE 5 MG/ML IJ SOLN
5.0000 mg | Freq: Once | INTRAMUSCULAR | Status: AC
  Administered 2015-07-11: 5 mg via INTRAMUSCULAR
  Filled 2015-07-11: qty 1

## 2015-07-11 NOTE — ED Notes (Addendum)
Offered pt sandwich and water - pt cursing and refusing. Security and Off [employer] standing by.

## 2015-07-11 NOTE — ED Notes (Addendum)
Pt's daughter at nurses' desk talking on her cell phone attempting to locate pt's vehicle. Pt ambulated into hallway - daughter advised pt to return to her room. Pt refused - stating she needed to go to the bathroom. Sitter assisted pt - pt then again refused to return to her room. Sitter, EMT and daughter escorted pt back to her room.
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
## 2015-07-11 NOTE — ED Notes (Signed)
Pt tolerated injection well. EMT and security assisted.

## 2015-07-11 NOTE — ED Notes (Signed)
TTS in room and patient completing assessment at this time
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
## 2015-07-11 NOTE — ED Notes (Signed)
Daughter at bedside talking w/pt. Pt noted to be calmer w/daughter present. Daughter advised pt lives w/her. States she has been worsening and feels she may have dementia. States pt's last dialysis was 07/09/15. States she did not go today. Pt continues to refuse CT scan and labs. Daughter assisting in talking w/pt. Daughter assisted pt to bathroom and in obtaining urine
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
## 2015-07-11 NOTE — ED Notes (Signed)
IVC papers served - copy faxed to [facility], copy sent to medical records, all other 4 copies on chart.
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
## 2015-07-11 NOTE — ED Notes (Signed)
Pt not wanting to stay  She wants to leave  She reports that she will take a taxi

## 2015-07-11 NOTE — ED Notes (Signed)
Daughter advised she will take pt's belongings when she leaves. Daughter looking through pt's belongings while on phone w/[employer] attempting to locate pt's vehicle.

## 2015-07-11 NOTE — ED Notes (Signed)
Pt arrived to C22 via stretcher. Multiple staff, security and [employer] Officer encouarged and assisted pt w/moving to bed. Pt got up from bed and attempted to leave room. Pt escorted back to bed. Pt refusing any labs and CT scan.

## 2015-07-11 NOTE — ED Notes (Signed)
Called staffing for a Air cabin crew. Security at the bedside at this time due to pt trying to leave and acting aggressive towards staff.

## 2015-07-11 NOTE — ED Notes (Signed)
2nd phlebotomist in w/pt.

## 2015-07-11 NOTE — ED Notes (Signed)
Daughter able to get pt to lie down on bed. Phlebotomy has arrived to bedside.

## 2015-07-11 NOTE — ED Notes (Signed)
CT Tech aware pt advised she will cooperate for CT to be performed. Daughter assisting w/pt.
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
## 2015-07-11 NOTE — ED Notes (Addendum)
gpd and security  At the bedside for the past hour.  She is unco-operative all the time  Sitter at there bedside  Pt undressed with assistance from staff security and gpd placed into burgundy scrubs.  Unhappy that her personal belongings were seperated from her.  Pt moved to pod c rm 22
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
## 2015-07-12 DIAGNOSIS — F319 Bipolar disorder, unspecified: Secondary | ICD-10-CM | POA: Diagnosis not present

## 2015-07-12 MED ORDER — DIVALPROEX SODIUM ER 500 MG PO TB24
500.0000 mg | ORAL_TABLET | Freq: Every morning | ORAL | Status: DC
  Administered 2015-07-12 – 2015-07-13 (×2): 500 mg via ORAL
  Filled 2015-07-12 (×2): qty 1

## 2015-07-12 MED ORDER — SIMVASTATIN 10 MG PO TABS
10.0000 mg | ORAL_TABLET | Freq: Every day | ORAL | Status: DC
  Administered 2015-07-12: 10 mg via ORAL
  Filled 2015-07-12 (×3): qty 1

## 2015-07-12 MED ORDER — LORAZEPAM 2 MG/ML IJ SOLN
2.0000 mg | INTRAMUSCULAR | Status: DC | PRN
  Administered 2015-07-12: 2 mg via INTRAMUSCULAR
  Filled 2015-07-12: qty 1

## 2015-07-12 MED ORDER — DIVALPROEX SODIUM ER 500 MG PO TB24
1000.0000 mg | ORAL_TABLET | Freq: Every day | ORAL | Status: DC
  Administered 2015-07-12: 1000 mg via ORAL
  Filled 2015-07-12 (×2): qty 2

## 2015-07-12 MED ORDER — ZIPRASIDONE HCL 20 MG PO CAPS
60.0000 mg | ORAL_CAPSULE | Freq: Two times a day (BID) | ORAL | Status: DC
  Administered 2015-07-12 – 2015-07-13 (×3): 60 mg via ORAL
  Filled 2015-07-12 (×4): qty 3

## 2015-07-12 MED ORDER — METOPROLOL TARTRATE 25 MG PO TABS
25.0000 mg | ORAL_TABLET | Freq: Two times a day (BID) | ORAL | Status: DC
  Administered 2015-07-12 – 2015-07-13 (×3): 25 mg via ORAL
  Filled 2015-07-12 (×4): qty 1

## 2015-07-12 MED ORDER — LORAZEPAM 0.5 MG PO TABS
0.5000 mg | ORAL_TABLET | Freq: Three times a day (TID) | ORAL | Status: DC | PRN
  Administered 2015-07-12 – 2015-07-13 (×2): 0.5 mg via ORAL
  Filled 2015-07-12 (×2): qty 1

## 2015-07-12 MED ORDER — LORAZEPAM 2 MG/ML IJ SOLN: 1.0000 mg | Freq: Once | INTRAMUSCULAR | Status: DC

## 2015-07-12 MED ORDER — NICOTINE 21 MG/24HR TD PT24
21.0000 mg | MEDICATED_PATCH | Freq: Every day | TRANSDERMAL | Status: DC
  Administered 2015-07-12 – 2015-07-13 (×2): 21 mg via TRANSDERMAL
  Filled 2015-07-12 (×2): qty 1

## 2015-07-12 MED ORDER — DOXEPIN HCL 25 MG PO CAPS
25.0000 mg | ORAL_CAPSULE | Freq: Every day | ORAL | Status: DC
  Administered 2015-07-13: 25 mg via ORAL
  Filled 2015-07-12 (×3): qty 1

## 2015-07-12 MED ORDER — PANTOPRAZOLE SODIUM 40 MG PO TBEC
40.0000 mg | DELAYED_RELEASE_TABLET | Freq: Every day | ORAL | Status: DC
  Administered 2015-07-12 – 2015-07-13 (×2): 40 mg via ORAL
  Filled 2015-07-12 (×2): qty 1

## 2015-07-12 MED ORDER — STARCH (THICKENING) PO POWD
ORAL | Status: DC | PRN
  Administered 2015-07-12: 21:00:00 via ORAL
  Filled 2015-07-12 (×2): qty 227

## 2015-07-12 MED ORDER — ALBUTEROL SULFATE (2.5 MG/3ML) 0.083% IN NEBU
2.5000 mg | INHALATION_SOLUTION | Freq: Four times a day (QID) | RESPIRATORY_TRACT | Status: DC | PRN
  Administered 2015-07-12: 2.5 mg via RESPIRATORY_TRACT
  Filled 2015-07-12: qty 3

## 2015-07-12 MED ORDER — ALBUTEROL SULFATE (2.5 MG/3ML) 0.083% IN NEBU
2.5000 mg | INHALATION_SOLUTION | RESPIRATORY_TRACT | Status: DC | PRN
  Administered 2015-07-12: 2.5 mg via RESPIRATORY_TRACT
  Filled 2015-07-12: qty 3

## 2015-07-12 MED ORDER — PREDNISONE 20 MG PO TABS
40.0000 mg | ORAL_TABLET | Freq: Every day | ORAL | Status: DC
  Administered 2015-07-12 – 2015-07-13 (×2): 40 mg via ORAL
  Filled 2015-07-12 (×2): qty 2

## 2015-07-12 MED ORDER — GUAIFENESIN ER 600 MG PO TB12
600.0000 mg | ORAL_TABLET | Freq: Two times a day (BID) | ORAL | Status: DC
  Administered 2015-07-12 – 2015-07-13 (×2): 600 mg via ORAL
  Filled 2015-07-12 (×2): qty 1

## 2015-07-12 MED ORDER — SEVELAMER CARBONATE 800 MG PO TABS
1600.0000 mg | ORAL_TABLET | Freq: Three times a day (TID) | ORAL | Status: DC
  Administered 2015-07-12: 800 mg via ORAL
  Administered 2015-07-12 – 2015-07-13 (×3): 1600 mg via ORAL
  Filled 2015-07-12 (×8): qty 2

## 2015-07-12 MED ORDER — ACETAMINOPHEN 325 MG PO TABS
650.0000 mg | ORAL_TABLET | ORAL | Status: DC | PRN
  Administered 2015-07-13: 650 mg via ORAL
  Filled 2015-07-12 (×2): qty 2

## 2015-07-12 NOTE — ED Notes (Signed)
Telepsych being performed. Pt now on phone w/her daughter.

## 2015-07-12 NOTE — ED Notes (Signed)
Per Marijean Bravo [facility] - will notify RN when [facility] NP calls him to advise when telepsych may be expected.
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
## 2015-07-12 NOTE — Progress Notes (Addendum)
Disposition CSW completed additional patient referrals to the following inpatient Psych and Geropsychiatry( Pt possible has early dementia) facilities:   [facility]  Additionally faxed referral to [facility] due to multiple medical needs.  Patient has already been referred to:  [facility](Declined) [facility](Declined) [facility]ined) [facility]  CSW will continue to follow patient for placement needs.  [facility] Dis[facility] CSW [phone]

## 2015-07-12 NOTE — ED Notes (Signed)
Patient up, standing in door way with sitter.

## 2015-07-12 NOTE — ED Notes (Signed)
Pt at nurses' desk asking for phone number to [facility] d/t states supposed to be at her house in the am and [employer] numbers written on paper for pt. Pt returned to room w/sitter.

## 2015-07-12 NOTE — ED Notes (Signed)
Pt ambulated to restroom accompanied by sitter and this RN.

## 2015-07-12 NOTE — ED Notes (Signed)
Pt refused Tylenol she has been asking for. Offered to pt x 3 - continued to refuse.

## 2015-07-12 NOTE — ED Notes (Signed)
Pt out in hallway wanting to walk around. Pt redirected by this RN and security and to go to room. Pt constantly cursing at sitter and this RN.

## 2015-07-12 NOTE — ED Notes (Signed)
Heating.

## 2015-07-12 NOTE — ED Notes (Signed)
POC - Inpatient, seeking at other facilities

## 2015-07-12 NOTE — ED Notes (Signed)
TTS assessment completed. 

## 2015-07-12 NOTE — ED Notes (Signed)
Pt ambulated to restroom with this RN and Amy EMT in tow.

## 2015-07-12 NOTE — ED Notes (Signed)
Telepsych being performed. 

## 2015-07-12 NOTE — ED Notes (Signed)
Pt noted w/nonprod cough. States has pneumonia.
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
## 2015-07-12 NOTE — ED Notes (Addendum)
Patient arguing with sitter at this time in hall.  States she needs to go to the restroom, but does not want sitter to keep door cracked.  Patient is arguing with sitter in the hall, and does not want to return to her room.  Pt continues to use profanity with sitter.  Sitter maintaining calm with patient and encouraged patient to make a decision.  This RN spoke to patient and encouraged her to make a decision about using the restroom with sitter present or return to room.  While this RN was speaking to patient, patient asked this RN "where's my depakote".  This RN responded that patient stated she could not swallow it.  Patient states "no I didn't".    Patient has returned to her room at this time.

## 2015-07-12 NOTE — ED Notes (Signed)
Sitter cutting up pt's chicken for her. Pt cursing at sitter and calling her inappropriate names. Security standing by.

## 2015-07-12 NOTE — ED Notes (Signed)
Frankey Poot, [facility] - aware of telespych order - advised may be performed this evening or tomorrow d/t no extender available.

## 2015-07-12 NOTE — ED Notes (Signed)
Patient is refusing all medications.  When this RN came to room to give patient her medications patient states "You don't know who I am".  This RN stated "I do, but I also want to ask you your name and DOB".  Patient refused to answer.  This RN went in to room to scan patient's bracelet, and patient states "I can't swallow".  This RN reminded patient that she ate a sandwich earlier, and patient states "I need thickener, it's the only way I drink my water".  This RN reminded patient that she has been drinking water all night without thickener.  Patient states "I can't swallow those pills".  This RN asked patient if she was refusing the medications, and patient refused to make eye contact with RN or answer.  This RN asked again, and patient remained silent.  This RN to document "patient refused" for attempt at medication administration.
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
## 2015-07-12 NOTE — ED Notes (Signed)
Pt standing in hallway - refusing to go back to room after much encouragement from staff. Security assisted - pt returned to her room. Pt continues w/verbal aggressiveness.

## 2015-07-12 NOTE — ED Notes (Signed)
Pt walked out of bathroom with wet hair and wet face. Pt stated "I was taking a bath in the bathroom" Pt threw wet paper towel in floor and stated "my nurse told me to throw the sh-- on the floor" Pt began walking past other rooms and stating "I need oxygen like they have" Pt redirected and walked back to room by this RN and back to bed.

## 2015-07-12 NOTE — ED Notes (Signed)
Telepsych machine placed in room as requested by Marijean Bravo, [facility] - for Jannette Spanner, NP, to perform Telepsych. Pt being argumentative  - advising she does not need to talk to them.

## 2015-07-12 NOTE — ED Provider Notes (Addendum)
13:00- I was asked by nursing to evaluate the patient, for argumentative, and somewhat violent behavior. At this time. She is sitting on a stretcher, eating lunch, and chatting with the sitter and security guard. She has hyper verbose, and is having flight of ideas but no other overt psychotic symptom. She received her first dose of oral Geodon, about 1 hour ago. Patient has been seen by TTS, counselor, but not a TTS psychiatric provider. She is currently relatively stable, and does not need urgent sedative treatment.  I ordered when necessary IM sedation with Ativan, and restraint when necessary, and will continue to observe while her oral dose of Geodon is metabolizing.   15:00- nursing states that the patient threw her lunch tray, and walked out of her room, and would not return, when directed to do so. She was therefore given IM Ativan, currently she is sleeping comfortably, and not in respiratory distress.  Nursing staff state that there are no psychiatric providers due to medication recommendations, at this time. They may be available later this evening, or possibly as late as tomorrow morning.    Daleen Bo, MD 07/12/15 [phone]

## 2015-07-12 NOTE — ED Notes (Addendum)
Pt refused Geodon when offered earlier - requested for it to be given x 2 then initially attempted to refuse when RN brought it to her. Pt finally took med. Pt writing notes to RN rather than talking. When pt does talk, she is cursing and calling staff inappropriate names.

## 2015-07-12 NOTE — ED Notes (Signed)
Pt out in hallway stating to this RN and sitter "Look at my dialysis site, it's bruised, they didn't stick me right, they're trying to poison me" Pt escorted back to bed by this RN

## 2015-07-12 NOTE — ED Notes (Signed)
Patient back in hall arguing with sitter.  Patient would not return to room.  Pt arguing about why she cannot have her trash can in the room.  Security paged.  Security with patient at this time encouraging patient to return to her bed.  Patient back in bed but continuing to argue.

## 2015-07-12 NOTE — ED Notes (Signed)
O2 infusing to pt at 3L/min via n/c per pt request.

## 2015-07-12 NOTE — ED Notes (Addendum)
Pt at nurses' desk - refusing to go back to room. Yelling in her soft-toned voice that she wants a Bible - "It is my right to be given a Bible and I want a piece of paper!" Pt given a Bible as requested and paper. Pt then returned to room after much encouragement. Pt pressed call bell - advised RN her temperature had not been taken since she got here. Advised pt that we have checked her temp. Pt then stated, "You're a m-- f--ing liar." RN advised pt inappropriate behavior will not be tolerated. Pt continued w/cursing and called RN "incompetent".

## 2015-07-12 NOTE — ED Notes (Signed)
Pt ambulated to restroom.  Sitter states "I've got the door" and patient states back to the sitter "hey you god-d**n mother f**ker, why you gotta look at me while I pee?".  Sitter maintained calm attitude with patient.  Patient washed her hands and returned to her bed.  Patient was cursing the whole time back to her bed, and continues to be upset with staff/sitter.  Patient in bed safely.

## 2015-07-12 NOTE — ED Notes (Signed)
Pt tolerated Ativan injection well after much encouragement from staff.

## 2015-07-12 NOTE — BH Assessment (Signed)
Faxed clinical information to the following facilities for placement:  [place]   [address] Anson Fret, [place], [facility], [facility] Triage Specialist [phone]
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
## 2015-07-13 ENCOUNTER — Encounter: Payer: Self-pay | Admitting: Psychiatry

## 2015-07-13 ENCOUNTER — Inpatient Hospital Stay
Admission: EM | Admit: 2015-07-13 | Discharge: 2015-07-23 | DRG: 885 | Disposition: A | Discharge status: Home / Self Care | Payer: 59 | Source: Intra-hospital | Attending: Psychiatry | Admitting: Psychiatry

## 2015-07-13 DIAGNOSIS — E875 Hyperkalemia: Secondary | ICD-10-CM | POA: Diagnosis present

## 2015-07-13 DIAGNOSIS — Z9109 Other allergy status, other than to drugs and biological substances: Secondary | ICD-10-CM | POA: Diagnosis not present

## 2015-07-13 DIAGNOSIS — K625 Hemorrhage of anus and rectum: Secondary | ICD-10-CM | POA: Diagnosis present

## 2015-07-13 DIAGNOSIS — J440 Chronic obstructive pulmonary disease with acute lower respiratory infection: Secondary | ICD-10-CM | POA: Diagnosis present

## 2015-07-13 DIAGNOSIS — Z8701 Personal history of pneumonia (recurrent): Secondary | ICD-10-CM | POA: Diagnosis not present

## 2015-07-13 DIAGNOSIS — N186 End stage renal disease: Secondary | ICD-10-CM

## 2015-07-13 DIAGNOSIS — N2581 Secondary hyperparathyroidism of renal origin: Secondary | ICD-10-CM | POA: Diagnosis present

## 2015-07-13 DIAGNOSIS — N189 Chronic kidney disease, unspecified: Secondary | ICD-10-CM | POA: Diagnosis present

## 2015-07-13 DIAGNOSIS — Z833 Family history of diabetes mellitus: Secondary | ICD-10-CM | POA: Diagnosis not present

## 2015-07-13 DIAGNOSIS — W19XXXA Unspecified fall, initial encounter: Secondary | ICD-10-CM | POA: Diagnosis not present

## 2015-07-13 DIAGNOSIS — E785 Hyperlipidemia, unspecified: Secondary | ICD-10-CM

## 2015-07-13 DIAGNOSIS — E892 Postprocedural hypoparathyroidism: Secondary | ICD-10-CM | POA: Diagnosis present

## 2015-07-13 DIAGNOSIS — Y92230 Patient room in hospital as the place of occurrence of the external cause: Secondary | ICD-10-CM | POA: Diagnosis present

## 2015-07-13 DIAGNOSIS — Z9114 Patient's other noncompliance with medication regimen: Secondary | ICD-10-CM | POA: Diagnosis not present

## 2015-07-13 DIAGNOSIS — F17210 Nicotine dependence, cigarettes, uncomplicated: Secondary | ICD-10-CM | POA: Diagnosis present

## 2015-07-13 DIAGNOSIS — Z8249 Family history of ischemic heart disease and other diseases of the circulatory system: Secondary | ICD-10-CM

## 2015-07-13 DIAGNOSIS — F312 Bipolar disorder, current episode manic severe with psychotic features: Secondary | ICD-10-CM | POA: Diagnosis present

## 2015-07-13 DIAGNOSIS — J449 Chronic obstructive pulmonary disease, unspecified: Secondary | ICD-10-CM | POA: Diagnosis present

## 2015-07-13 DIAGNOSIS — I679 Cerebrovascular disease, unspecified: Secondary | ICD-10-CM

## 2015-07-13 DIAGNOSIS — G4700 Insomnia, unspecified: Secondary | ICD-10-CM | POA: Diagnosis present

## 2015-07-13 DIAGNOSIS — R410 Disorientation, unspecified: Secondary | ICD-10-CM

## 2015-07-13 DIAGNOSIS — F17200 Nicotine dependence, unspecified, uncomplicated: Secondary | ICD-10-CM | POA: Diagnosis present

## 2015-07-13 DIAGNOSIS — I120 Hypertensive chronic kidney disease with stage 5 chronic kidney disease or end stage renal disease: Secondary | ICD-10-CM | POA: Diagnosis present

## 2015-07-13 DIAGNOSIS — D631 Anemia in chronic kidney disease: Secondary | ICD-10-CM | POA: Diagnosis present

## 2015-07-13 DIAGNOSIS — Z96651 Presence of right artificial knee joint: Secondary | ICD-10-CM | POA: Diagnosis present

## 2015-07-13 DIAGNOSIS — K219 Gastro-esophageal reflux disease without esophagitis: Secondary | ICD-10-CM | POA: Diagnosis present

## 2015-07-13 DIAGNOSIS — T426X5A Adverse effect of other antiepileptic and sedative-hypnotic drugs, initial encounter: Secondary | ICD-10-CM | POA: Diagnosis present

## 2015-07-13 DIAGNOSIS — J189 Pneumonia, unspecified organism: Secondary | ICD-10-CM

## 2015-07-13 DIAGNOSIS — Z992 Dependence on renal dialysis: Secondary | ICD-10-CM

## 2015-07-13 DIAGNOSIS — F319 Bipolar disorder, unspecified: Secondary | ICD-10-CM | POA: Diagnosis not present

## 2015-07-13 LAB — RENAL FUNCTION PANEL
Albumin: 2.9 g/dL — ABNORMAL LOW (ref 3.5–5.0)
Anion gap: 11 (ref 5–15)
BUN: 71 mg/dL — ABNORMAL HIGH (ref 6–20)
CO2: 24 mmol/L (ref 22–32)
Calcium: 9 mg/dL (ref 8.9–10.3)
Chloride: 104 mmol/L (ref 101–111)
Creatinine, Ser: 6.03 mg/dL — ABNORMAL HIGH (ref 0.44–1.00)
GFR calc Af Amer: 8 mL/min — ABNORMAL LOW (ref >60)
GFR calc non Af Amer: 7 mL/min — ABNORMAL LOW (ref >60)
Glucose, Bld: 153 mg/dL — ABNORMAL HIGH (ref 65–99)
Phosphorus: 5.9 mg/dL — ABNORMAL HIGH (ref 2.5–4.6)
Potassium: 4.8 mmol/L (ref 3.5–5.1)
Sodium: 139 mmol/L (ref 135–145)

## 2015-07-13 LAB — CBC
HCT: 34.9 % — ABNORMAL LOW (ref 36.0–46.0)
Hemoglobin: 10.4 g/dL — ABNORMAL LOW (ref 12.0–15.0)
MCH: 29.1 pg (ref 26.0–34.0)
MCHC: 29.8 g/dL — ABNORMAL LOW (ref 30.0–36.0)
MCV: 97.5 fL (ref 78.0–100.0)
Platelets: 392 10*3/uL (ref 150–400)
RBC: 3.58 MIL/uL — ABNORMAL LOW (ref 3.87–5.11)
RDW: 20.7 % — ABNORMAL HIGH (ref 11.5–15.5)
WBC: 7.9 10*3/uL (ref 4.0–10.5)

## 2015-07-13 MED ORDER — ALBUTEROL SULFATE HFA 108 (90 BASE) MCG/ACT IN AERS
2.0000 | INHALATION_SPRAY | Freq: Four times a day (QID) | RESPIRATORY_TRACT | Status: DC | PRN
  Administered 2015-07-19 – 2015-07-20 (×2): 2 via RESPIRATORY_TRACT

## 2015-07-13 MED ORDER — PENTAFLUOROPROP-TETRAFLUOROETH EX AERO: 1.0000 "application " | INHALATION_SPRAY | CUTANEOUS | Status: DC | PRN

## 2015-07-13 MED ORDER — DARBEPOETIN ALFA 100 MCG/0.5ML IJ SOSY
PREFILLED_SYRINGE | INTRAMUSCULAR | Status: DC
Start: 2015-07-13 — End: 2015-07-13
  Filled 2015-07-13: qty 0.5

## 2015-07-13 MED ORDER — PANTOPRAZOLE SODIUM 40 MG PO TBEC
40.0000 mg | DELAYED_RELEASE_TABLET | Freq: Every day | ORAL | Status: DC
Start: 2015-07-14 — End: 2015-07-23
  Administered 2015-07-14 – 2015-07-23 (×10): 40 mg via ORAL
  Filled 2015-07-13 (×10): qty 1

## 2015-07-13 MED ORDER — ALTEPLASE 2 MG IJ SOLR: 2.0000 mg | Freq: Once | INTRAMUSCULAR | Status: DC | PRN

## 2015-07-13 MED ORDER — LEVOFLOXACIN 250 MG PO TABS: 250.0000 mg | ORAL_TABLET | ORAL | Status: DC

## 2015-07-13 MED ORDER — HEPARIN SODIUM (PORCINE) 1000 UNIT/ML DIALYSIS: 1000.0000 [IU] | INTRAMUSCULAR | Status: DC | PRN

## 2015-07-13 MED ORDER — SODIUM CHLORIDE 0.9 % IV SOLN: 100.0000 mL | INTRAVENOUS | Status: DC | PRN

## 2015-07-13 MED ORDER — NEPHRO-VITE 0.8 MG PO TABS
1.0000 | ORAL_TABLET | Freq: Every day | ORAL | Status: DC
  Administered 2015-07-13 – 2015-07-22 (×10): 1 via ORAL
  Filled 2015-07-13 (×20): qty 1

## 2015-07-13 MED ORDER — MAGNESIUM HYDROXIDE 400 MG/5ML PO SUSP
30.0000 mL | Freq: Every day | ORAL | Status: DC | PRN
Start: 2015-07-13 — End: 2015-07-23

## 2015-07-13 MED ORDER — LIDOCAINE-PRILOCAINE 2.5-2.5 % EX CREA: 1.0000 "application " | TOPICAL_CREAM | CUTANEOUS | Status: DC | PRN

## 2015-07-13 MED ORDER — ZIPRASIDONE HCL 20 MG PO CAPS
60.0000 mg | ORAL_CAPSULE | Freq: Two times a day (BID) | ORAL | Status: DC
  Administered 2015-07-14: 60 mg via ORAL
  Filled 2015-07-13: qty 3

## 2015-07-13 MED ORDER — CINACALCET HCL 30 MG PO TABS
60.0000 mg | ORAL_TABLET | Freq: Two times a day (BID) | ORAL | Status: DC
  Administered 2015-07-14: 60 mg via ORAL
  Filled 2015-07-13 (×3): qty 2

## 2015-07-13 MED ORDER — LIDOCAINE HCL (PF) 1 % IJ SOLN: 5.0000 mL | INTRAMUSCULAR | Status: DC | PRN

## 2015-07-13 MED ORDER — METOPROLOL TARTRATE 25 MG PO TABS
25.0000 mg | ORAL_TABLET | Freq: Two times a day (BID) | ORAL | Status: DC
  Administered 2015-07-13 – 2015-07-23 (×19): 25 mg via ORAL
  Filled 2015-07-13 (×21): qty 1

## 2015-07-13 MED ORDER — SODIUM CHLORIDE 0.9 % IV SOLN
250.0000 mg | Freq: Once | INTRAVENOUS | Status: AC
  Administered 2015-07-13: 250 mg via INTRAVENOUS
  Filled 2015-07-13 (×2): qty 20

## 2015-07-13 MED ORDER — BISACODYL 5 MG PO TBEC
5.0000 mg | DELAYED_RELEASE_TABLET | Freq: Every day | ORAL | Status: DC | PRN
  Filled 2015-07-13: qty 1

## 2015-07-13 MED ORDER — SIMVASTATIN 20 MG PO TABS
10.0000 mg | ORAL_TABLET | Freq: Every day | ORAL | Status: DC
  Administered 2015-07-14 – 2015-07-22 (×9): 10 mg via ORAL
  Filled 2015-07-13 (×3): qty 1
  Filled 2015-07-13 (×2): qty 2
  Filled 2015-07-13 (×5): qty 1
  Filled 2015-07-13: qty 2

## 2015-07-13 MED ORDER — DIVALPROEX SODIUM 500 MG PO DR TAB
500.0000 mg | DELAYED_RELEASE_TABLET | Freq: Three times a day (TID) | ORAL | Status: DC
  Administered 2015-07-13 – 2015-07-14 (×2): 500 mg via ORAL
  Filled 2015-07-13 (×2): qty 1

## 2015-07-13 MED ORDER — HYDROCORTISONE 2.5 % RE CREA
TOPICAL_CREAM | Freq: Two times a day (BID) | RECTAL | Status: DC
  Administered 2015-07-13: 1 via RECTAL
  Administered 2015-07-17 (×2): via RECTAL
  Administered 2015-07-18 – 2015-07-19 (×2): 1 via RECTAL
  Administered 2015-07-19: 09:00:00 via RECTAL
  Filled 2015-07-13: qty 28.35

## 2015-07-13 MED ORDER — DOXEPIN HCL 25 MG PO CAPS
25.0000 mg | ORAL_CAPSULE | Freq: Every day | ORAL | Status: DC
  Administered 2015-07-13: 25 mg via ORAL
  Filled 2015-07-13 (×2): qty 1

## 2015-07-13 MED ORDER — NICOTINE 21 MG/24HR TD PT24
21.0000 mg | MEDICATED_PATCH | Freq: Every day | TRANSDERMAL | Status: DC
  Administered 2015-07-14: 21 mg via TRANSDERMAL
  Filled 2015-07-13 (×2): qty 1

## 2015-07-13 MED ORDER — LEVOFLOXACIN 500 MG PO TABS
500.0000 mg | ORAL_TABLET | Freq: Once | ORAL | Status: AC
Start: 2015-07-14 — End: 2015-07-14
  Administered 2015-07-14: 500 mg via ORAL
  Filled 2015-07-13: qty 1

## 2015-07-13 MED ORDER — DARBEPOETIN ALFA 100 MCG/0.5ML IJ SOSY
100.0000 ug | PREFILLED_SYRINGE | INTRAMUSCULAR | Status: DC
  Administered 2015-07-13: 100 ug via INTRAVENOUS

## 2015-07-13 MED ORDER — DOXERCALCIFEROL 4 MCG/2ML IV SOLN
4.0000 ug | INTRAVENOUS | Status: DC
  Administered 2015-07-13: 4 ug via INTRAVENOUS

## 2015-07-13 MED ORDER — DOXERCALCIFEROL 4 MCG/2ML IV SOLN
INTRAVENOUS | Status: AC
  Filled 2015-07-13: qty 2

## 2015-07-13 MED ORDER — LEVOFLOXACIN 500 MG PO TABS: 500.0000 mg | ORAL_TABLET | Freq: Every day | ORAL | Status: DC

## 2015-07-13 MED ORDER — SEVELAMER CARBONATE 800 MG PO TABS
800.0000 mg | ORAL_TABLET | Freq: Three times a day (TID) | ORAL | Status: DC
  Administered 2015-07-14: 800 mg via ORAL
  Filled 2015-07-13: qty 1

## 2015-07-13 MED ORDER — MOMETASONE FURO-FORMOTEROL FUM 100-5 MCG/ACT IN AERO
2.0000 | INHALATION_SPRAY | Freq: Two times a day (BID) | RESPIRATORY_TRACT | Status: DC
  Administered 2015-07-13 – 2015-07-23 (×20): 2 via RESPIRATORY_TRACT
  Filled 2015-07-13: qty 8.8

## 2015-07-13 MED ORDER — DOCUSATE SODIUM 100 MG PO CAPS
200.0000 mg | ORAL_CAPSULE | Freq: Two times a day (BID) | ORAL | Status: DC
  Administered 2015-07-13 – 2015-07-23 (×19): 200 mg via ORAL
  Filled 2015-07-13 (×19): qty 2

## 2015-07-13 NOTE — ED Notes (Signed)
Patient lunch order was changed to a Renal Diet.

## 2015-07-13 NOTE — ED Notes (Signed)
Pt was given snack and decaf coffee.

## 2015-07-13 NOTE — Progress Notes (Signed)
Pt to be dialyzed today per outpatient orders - she is normally on a TTS schedule - but last HD had for 1.5 hours on Thursday 3/2 so we will check labs and dialyze today.  She will continue on a MWF schedule while she is awaiting psych placement.

## 2015-07-13 NOTE — ED Notes (Signed)
Pt requesting oxygen states that she NEEDS it. O2 sats checked, ranging from 94-97%. Oxygen was not placed on pt despite pt request. [employer] at bedside. Pt is now using phone to make last phone call of the day.

## 2015-07-13 NOTE — Progress Notes (Signed)
Patient admitted and oriented to unit. Skin check done. Patient having bilateral lower leg edema. Patient having multiple bruises to abdomen and one bruise on left forearm. Patient tearful and irritable on admission. Safety check done no contraband found. Patient with dialysis shunt on RUA.

## 2015-07-13 NOTE — ED Notes (Signed)
Pt returned to Rm 22 from dialysis
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
## 2015-07-13 NOTE — ED Notes (Signed)
Patient was given a toothbrush and comb.

## 2015-07-13 NOTE — Progress Notes (Signed)
Per [facility] RN, patient is still in dialysis.  Verlon Setting, [place] Disposition staff 07/13/2015 6:54 PM

## 2015-07-13 NOTE — ED Notes (Signed)
Pt OTF to dialysis with sitter

## 2015-07-13 NOTE — ED Notes (Signed)
Pt not following unit rules, pt becoming verbally aggressive and refusing to go back to her room.  Pt making rude comments to other patients.  This RN used calming techniques to help pt go back to her room. Gave pt rules to the facility and pt grabbed them and threw them down.

## 2015-07-13 NOTE — ED Notes (Signed)
Pt screaming at RN called her a white Bitch and using racial and cuss words towards RN and other pts.

## 2015-07-13 NOTE — ED Provider Notes (Signed)
Pt has been accepted for transfer to [facility] at [place] by Dr. Shauna Hugh, MD 07/13/15 2024

## 2015-07-13 NOTE — Progress Notes (Signed)
Seen on HD goal 2.5 BP 143/79 - Hgb 10.4. Chemistries pending. Her only complaint is being in a room without a bathroom in the ED.  I see that she is being transferred to [facility] post HD today.  Amalia Hailey, PA-C

## 2015-07-13 NOTE — ED Notes (Addendum)
Coffee, Coke, And a [place] bag with graham crackers with peanut butter given to patient for a snack. A regular diet ordered for lunch.

## 2015-07-13 NOTE — ED Notes (Signed)
Pt coming to nurse station in an aggressive manner and calling nurse a Bitch because she can't have coffee yet.  Pt reminded of unit rules.

## 2015-07-13 NOTE — Progress Notes (Signed)
Discussed pt's case with psych team. Dr Adele Schilder has accepted pt to [facility]. Spoke with charge RN Silva Bandy- pt assigned bed 310, attending is Dr Jerilee Hoh. Report can be called at [phone]. Pt under IVC. Spoke with [facility]. Pt in dialysis currently- will be transferred when complete.   Sharren Bridge, MSW, LCSW Clinical Social Work, Disposition  07/13/2015 [phone]

## 2015-07-13 NOTE — ED Notes (Signed)
[place] Dept. has arrived pt pick up pt. Pt refusing to go, [employer] and security on standby.

## 2015-07-14 ENCOUNTER — Inpatient Hospital Stay: Payer: 59

## 2015-07-14 DIAGNOSIS — F312 Bipolar disorder, current episode manic severe with psychotic features: Principal | ICD-10-CM

## 2015-07-14 DIAGNOSIS — E785 Hyperlipidemia, unspecified: Secondary | ICD-10-CM

## 2015-07-14 LAB — GLUCOSE, CAPILLARY: Glucose-Capillary: 61 mg/dL — ABNORMAL LOW (ref 65–99)

## 2015-07-14 MED ORDER — LORAZEPAM 2 MG/ML IJ SOLN: 2.0000 mg | Freq: Three times a day (TID) | INTRAMUSCULAR | Status: DC | PRN

## 2015-07-14 MED ORDER — FLUPHENAZINE HCL 2.5 MG/ML IJ SOLN
5.0000 mg | Freq: Three times a day (TID) | INTRAMUSCULAR | Status: DC | PRN
  Filled 2015-07-14: qty 2

## 2015-07-14 MED ORDER — DIPHENHYDRAMINE HCL 50 MG/ML IJ SOLN: 50.0000 mg | Freq: Three times a day (TID) | INTRAMUSCULAR | Status: DC | PRN

## 2015-07-14 MED ORDER — SEVELAMER CARBONATE 800 MG PO TABS
1600.0000 mg | ORAL_TABLET | Freq: Three times a day (TID) | ORAL | Status: DC
  Administered 2015-07-14 – 2015-07-23 (×23): 1600 mg via ORAL
  Filled 2015-07-14 (×24): qty 2

## 2015-07-14 MED ORDER — OLANZAPINE 10 MG IM SOLR
10.0000 mg | Freq: Four times a day (QID) | INTRAMUSCULAR | Status: DC | PRN
  Administered 2015-07-14: 10 mg via INTRAMUSCULAR
  Filled 2015-07-14: qty 10

## 2015-07-14 MED ORDER — DIPHENHYDRAMINE HCL 25 MG PO CAPS
50.0000 mg | ORAL_CAPSULE | Freq: Three times a day (TID) | ORAL | Status: DC | PRN
  Administered 2015-07-14 – 2015-07-17 (×4): 50 mg via ORAL
  Filled 2015-07-14 (×5): qty 2

## 2015-07-14 MED ORDER — DIVALPROEX SODIUM ER 500 MG PO TB24
2500.0000 mg | ORAL_TABLET | Freq: Every day | ORAL | Status: DC
  Administered 2015-07-14: 2500 mg via ORAL
  Filled 2015-07-14: qty 5

## 2015-07-14 MED ORDER — CINACALCET HCL 30 MG PO TABS
60.0000 mg | ORAL_TABLET | Freq: Every day | ORAL | Status: DC
  Administered 2015-07-15 – 2015-07-23 (×8): 60 mg via ORAL
  Filled 2015-07-14 (×8): qty 2

## 2015-07-14 MED ORDER — LORAZEPAM 2 MG PO TABS
2.0000 mg | ORAL_TABLET | Freq: Every day | ORAL | Status: DC
  Administered 2015-07-14 – 2015-07-19 (×6): 2 mg via ORAL
  Filled 2015-07-14 (×7): qty 1

## 2015-07-14 MED ORDER — ACETAMINOPHEN 500 MG PO TABS
1000.0000 mg | ORAL_TABLET | Freq: Four times a day (QID) | ORAL | Status: DC | PRN
  Administered 2015-07-14 – 2015-07-23 (×12): 1000 mg via ORAL
  Filled 2015-07-14 (×15): qty 2

## 2015-07-14 MED ORDER — LORAZEPAM 2 MG PO TABS
2.0000 mg | ORAL_TABLET | Freq: Three times a day (TID) | ORAL | Status: DC | PRN
Start: 2015-07-14 — End: 2015-07-20
  Administered 2015-07-14 – 2015-07-20 (×6): 2 mg via ORAL
  Filled 2015-07-14 (×5): qty 1

## 2015-07-14 MED ORDER — FLUPHENAZINE HCL 5 MG PO TABS
5.0000 mg | ORAL_TABLET | Freq: Three times a day (TID) | ORAL | Status: DC | PRN
  Administered 2015-07-15: 5 mg via ORAL
  Filled 2015-07-14 (×2): qty 1

## 2015-07-14 MED ORDER — NICOTINE 21 MG/24HR TD PT24
21.0000 mg | MEDICATED_PATCH | Freq: Every day | TRANSDERMAL | Status: DC
  Administered 2015-07-15 – 2015-07-23 (×7): 21 mg via TRANSDERMAL
  Filled 2015-07-14 (×7): qty 1

## 2015-07-14 MED ORDER — IBUPROFEN 600 MG PO TABS
600.0000 mg | ORAL_TABLET | Freq: Four times a day (QID) | ORAL | Status: DC | PRN
  Administered 2015-07-14: 600 mg via ORAL
  Filled 2015-07-14: qty 1

## 2015-07-14 MED ORDER — ZIPRASIDONE HCL 20 MG PO CAPS
80.0000 mg | ORAL_CAPSULE | Freq: Two times a day (BID) | ORAL | Status: DC
  Administered 2015-07-14 – 2015-07-21 (×11): 80 mg via ORAL
  Filled 2015-07-14 (×13): qty 4

## 2015-07-14 NOTE — Progress Notes (Signed)
Patients Nicotine Transdermal Patch 21mg  held as patient had already received one this morning at 0649([facility]). Medication education done but pt. was not accepting and became quite irritable. Ativan 2mg  PO PRN administered at 1502. At 1602, patient noted to be less agitated, more approachable and she even took a shower/grooming. Thickener for her fluids ordered from dietary and placed in the med room.  Patient informed that he can request the thicker as needed.

## 2015-07-14 NOTE — Plan of Care (Signed)
Problem: Ineffective individual coping Goal: STG: Patient will remain free from self harm Outcome: Not Progressing Medications administered as ordered by the physician, medications Therapeutic Effects, SEs and Adverse effects discussed, questions encouraged; PRN given, continuous observation at eyesight maintained for safety, clinical and moral support provided, patient encouraged to continue to express feelings and demonstrate safe care. Patient remain free from harm, will continue to monitor.
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
## 2015-07-14 NOTE — Progress Notes (Signed)
Patient remains irritable, needy and demanding. This morning, she was fixated on having oxygen as she stated that she had had pneumonia and needed it. O2 sats done (93%-94%). Pt. required constant reassurance and redirection. Patient also had some crying spells, stated that we were denying her oxygen. Education provided but patient remained insistence. She also took all her medications with much encouragement.

## 2015-07-14 NOTE — BHH Group Notes (Signed)
[facility] LCSW Aftercare Discharge Planning Group Note  07/13/2015 9:15 AM  Participation Quality: Did Not Attend. Patient invited to participate but declined.   Alphonse Guild. Kathya Wilz, MSW, LCSWA, LCAS

## 2015-07-14 NOTE — Progress Notes (Signed)
Recreation Therapy Notes  Date: 03.07.17 Time: 3:00 pm Location: Craft Room  Group Topic: Self-expression  Goal Area(s) Addresses:  Patient will identify one color per emotion listed on wheel. Patient will verbalize benefit of using art as a means of self-expression. Patient will verbalize one emotion experienced during session. Patient will be educated on other forms of self-expression.  Behavioral Response: Did not attend  Intervention: Emotion Wheel  Activity: Patients were given an Emotion Wheel worksheet with 7 different emotions and were instructed to pick a color for each emotion.  Education: Patient did not attend group.  Education Outcome: Patient did not attend group.   Clinical Observations/Feedback: Patient did not attend group.  Leonette Monarch, LRT/CTRS 07/14/2015 4:10 PM

## 2015-07-14 NOTE — BHH Suicide Risk Assessment (Signed)
[facility] Admission Suicide Risk Assessment   Nursing information obtained from:    Demographic factors:    Current Mental Status:    Loss Factors:    Historical Factors:    Risk Reduction Factors:     Total Time spent with patient: 1 hour Principal Problem: Bipolar affective disorder, current episode manic with psychotic symptoms ([place]) Diagnosis:   Patient Active Problem List   Diagnosis Date Noted  . Bipolar affective disorder, current episode manic with psychotic symptoms ([place]) [F31.2] 07/13/2015  . Anemia of renal disease [D63.1] 01/26/2015  . CKD (chronic kidney disease) stage V requiring chronic dialysis ([place]) [N18.6, Z99.2] 05/13/2014  . COPD (chronic obstructive pulmonary disease) ([place]) [J44.9] 09/26/2013  . Hyperparathyroidism, primary ([place]) [E21.0] 02/20/2013  . Tobacco use disorder [F17.200] 11/13/2012  . HTN (hypertension) [I10] 02/20/2012   Subjective Data:   Continued Clinical Symptoms:  Alcohol Use Disorder Identification Test Final Score (AUDIT): 0 The "Alcohol Use Disorders Identification Test", Guidelines for Use in Primary Care, Second Edition.  World Pharmacologist [facility]). Score between 0-7:  no or low risk or alcohol related problems. Score between 8-15:  moderate risk of alcohol related problems. Score between 16-19:  high risk of alcohol related problems. Score 20 or above:  warrants further diagnostic evaluation for alcohol dependence and treatment.   CLINICAL FACTORS:   Severe Anxiety and/or Agitation Currently Psychotic Previous Psychiatric Diagnoses and Treatments Medical Diagnoses and Treatments/Surgeries    Psychiatric Specialty Exam: ROS  Blood pressure 113/57, pulse 80, temperature 98.9 F (37.2 C), temperature source Oral, resp. rate 20, height 5\' 3"  (1.6 m), weight 81.5 kg (179 lb 10.8 oz), last menstrual period 05/09/2008, SpO2 93 %.Body mass index is 31.84 kg/(m^2).                                                        COGNITIVE FEATURES THAT CONTRIBUTE TO RISK:  Closed-mindedness    SUICIDE RISK:   Moderate:  Frequent suicidal ideation with limited intensity, and duration, some specificity in terms of plans, no associated intent, good self-control, limited dysphoria/symptomatology, some risk factors present, and identifiable protective factors, including available and accessible social support.  PLAN OF CARE: admit to [facility]  I certify that inpatient services furnished can reasonably be expected to improve the patient's condition.   Hildred Priest, MD 07/14/2015, 10:20 AM

## 2015-07-14 NOTE — Tx Team (Addendum)
Initial Interdisciplinary Treatment Plan   PATIENT STRESSORS: Health problems Legal issue Medication change or noncompliance   PATIENT STRENGTHS: Supportive family/friends  Religious Affiliation   PROBLEM LIST: Problem List/Patient Goals Date to be addressed Date deferred Reason deferred Estimated date of resolution  Aggression/Aggitation 07/13/2015     Hemodialysis (M/W/F) 07/13/2015     Pychosis (Bizarre Behavior0 07/13/2015                                          DISCHARGE CRITERIA:  Improved stabilization in mood, thinking, and/or behavior Reduction of life-threatening or endangering symptoms to within safe limits  PRELIMINARY DISCHARGE PLAN: Return to previous living arrangement  PATIENT/FAMIILY INVOLVEMENT: This treatment plan has been presented to and reviewed with the patient, Jennifer Lawson, and/or family member.  The patient and family have been given the opportunity to ask questions and make suggestions.  [name] 07/14/2015, 2:46 AM
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
## 2015-07-14 NOTE — Progress Notes (Signed)
Patient remains on a fluid restriction of 1200 ml/day. Patient did not eat breakfast. She however ate lunch, sncaks and dinner. Total fluid intake this shift was 781ml.
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
## 2015-07-14 NOTE — Plan of Care (Signed)
Problem: Ineffective individual coping Goal: LTG: Patient will report a decrease in negative feelings Outcome: Not Progressing Patient unable to have a logical discussion regarding her care.

## 2015-07-14 NOTE — BHH Group Notes (Signed)
[place] LCSW Group Therapy   07/13/2015 1pm  Type of Therapy: Group Therapy   Participation Level: Did Not Attend. Patient invited to participate but declined.    Alphonse Guild. Donevan Biller, MSW, LCSWA, LCAS

## 2015-07-14 NOTE — BHH Group Notes (Signed)
[place] Group Notes:  (Nursing/MHT/Case Management/Adjunct)  Date:  07/14/2015  Time:  2:19 PM  Type of Therapy:  Psychoeducational Skills  Participation Level:  Active  Participation Quality:  Appropriate  Affect:  Appropriate  Cognitive:  Appropriate  Insight:  Appropriate  Engagement in Group:  Engaged  Modes of Intervention:  Discussion, Education and Support  Summary of Progress/Problems:  Jennifer Lawson 07/14/2015, 2:19 PM

## 2015-07-14 NOTE — Progress Notes (Signed)
Zyprexa 10 mg IM given @ 0105 for agitation and disruptive behaviors.

## 2015-07-14 NOTE — BHH Group Notes (Signed)
[place] LCSW Group Therapy   07/14/2015 1pm  Type of Therapy: Group Therapy   Participation Level: Did Not Attend. Patient invited to participate but declined.    Alphonse Guild. Aleighna Wojtas, MSW, LCSWA, LCAS

## 2015-07-14 NOTE — Tx Team (Addendum)
Interdisciplinary Treatment Plan Update (Adult)  Date:  07/14/2015 Time Reviewed:  2:27 PM  Progress in Treatment: Attending groups: No.  Participating in groups:  No. Taking medication as prescribed:  Yes. Tolerating medication:  Yes. Family/Significant othe contact made:  No, will contact:  if patient provides consent Patient understands diagnosis:  No.  Discussing patient identified problems/goals with staff:  Yes. Medical problems stabilized or resolved:  Yes. Denies suicidal/homicidal ideation: Yes. Issues/concerns per patient self-inventory:  Yes. Other:  New problem(s) identified: No, Describe:  none reported  Discharge Plan or Barriers: Patient will stabilize on medications and discharge home. Patient will need hospital follow up schedule for outpatient services at discharge.  Reason for Continuation of Hospitalization: Aggression Mania Medication stabilization  Comments:   Estimated length of stay: up to 7 days expected to discharge Tuesday 07/21/15  New goal(s):  Review of initial/current patient goals per problem list:   1.  Goal(s):participate in aftercare plan  Met:  Yes  Target date:by discharge  As evidenced UI:[id number] will participate in aftercare plan AEB aftercare provider and housing plan identified at discharge 07/14/15: patient will discharge home and will follow up with Dr. Wonda Amis at [facility] in [place] at discharge. Goal met.   2.  Goal (s):decrease psychosis  Met:  No  Target date:by discharge  As evidenced by: demonstrates decreased signs of pschosis 07/14/15: Patient was aggressive with staff in ER and with EMS and stating she does not want dialysis and needs med stabilization as she stopped taking meds that were affecting patient kidney who is a dialysis patient  2.  Goal (s): Decrease mania   Met:  No  Target date:by discharge  As evidenced by: patient demonstrates decreased symptoms of mania 07/14/15: Patient is admitted for  psychosis and aggressive behavior and will need med stabilization.  Attendees: Physician:  Orson Slick, MD 1/25/201710:20 AM  Nursing:   Glee Arvin, RN 1/25/201710:20 AM  Other:  Carmell Austria, LCSWA 1/25/201710:20 AM  Other:   1/25/201710:20 AM  Other:   1/25/201710:20 AM  Other:  1/25/201710:20 AM  Other:  1/25/201710:20 AM  Other:  1/25/201710:20 AM  Other:  1/25/201710:20 AM  Other:  1/25/201710:20 AM  Other:  1/25/201710:20 AM  Other:   1/25/201710:20 AM    Scribe for Treatment Team:   Keene Breath, MSW, LCSWA   07/14/2015, 2:27 PM  [phone]

## 2015-07-15 LAB — CBC
HCT: 31.3 % — ABNORMAL LOW (ref 35.0–47.0)
Hemoglobin: 10 g/dL — ABNORMAL LOW (ref 12.0–16.0)
MCH: 31.2 pg (ref 26.0–34.0)
MCHC: 31.9 g/dL — ABNORMAL LOW (ref 32.0–36.0)
MCV: 97.7 fL (ref 80.0–100.0)
Platelets: 288 10*3/uL (ref 150–440)
RBC: 3.2 MIL/uL — ABNORMAL LOW (ref 3.80–5.20)
RDW: 22.7 % — ABNORMAL HIGH (ref 11.5–14.5)
WBC: 6.3 10*3/uL (ref 3.6–11.0)

## 2015-07-15 LAB — RENAL FUNCTION PANEL
Albumin: 2.9 g/dL — ABNORMAL LOW (ref 3.5–5.0)
Anion gap: 10 (ref 5–15)
BUN: 50 mg/dL — ABNORMAL HIGH (ref 6–20)
CO2: 27 mmol/L (ref 22–32)
Calcium: 8.6 mg/dL — ABNORMAL LOW (ref 8.9–10.3)
Chloride: 97 mmol/L — ABNORMAL LOW (ref 101–111)
Creatinine, Ser: 5.03 mg/dL — ABNORMAL HIGH (ref 0.44–1.00)
GFR calc Af Amer: 10 mL/min — ABNORMAL LOW (ref >60)
GFR calc non Af Amer: 9 mL/min — ABNORMAL LOW (ref >60)
Glucose, Bld: 104 mg/dL — ABNORMAL HIGH (ref 65–99)
Phosphorus: 5.1 mg/dL — ABNORMAL HIGH (ref 2.5–4.6)
Potassium: 3.7 mmol/L (ref 3.5–5.1)
Sodium: 134 mmol/L — ABNORMAL LOW (ref 135–145)

## 2015-07-15 MED ORDER — DIVALPROEX SODIUM ER 500 MG PO TB24
2000.0000 mg | ORAL_TABLET | Freq: Every day | ORAL | Status: DC
  Administered 2015-07-15 – 2015-07-22 (×8): 2000 mg via ORAL
  Filled 2015-07-15 (×9): qty 4

## 2015-07-15 NOTE — Progress Notes (Signed)
PRe HD [place]

## 2015-07-15 NOTE — Progress Notes (Addendum)
Patient with depressed affect, irritable when staff initiates interaction. Needs extra time to perform tasks. Extra time given and patient remains unwilling to cooperate with am meal, meds and tornado drill. Minimal interaction with peers. No SI/Hi at this time. Patient to have labs drawn at dialysis. Patient to dialysis escorted by security and sitter. Safety maintained. Ambulates with slow, steady gait with walker.

## 2015-07-15 NOTE — Plan of Care (Signed)
Problem: Consults Goal: [facility] General Treatment Patient Education Outcome: Progressing Patient resistive to eating breakfast, taking meds and interactions with Probation officer. Patient manipulative and irritable this am.

## 2015-07-15 NOTE — Progress Notes (Signed)
Patient with frequent requests against unit protocol. Patient will try to argue for what she wants. Writer sets firm limits. Patient to med room for meds and refuses 5 pm Geodon states "it is a night time med.".

## 2015-07-15 NOTE — Progress Notes (Signed)
HD Tx Termination 

## 2015-07-15 NOTE — Progress Notes (Signed)
Post HD Tx Assessment  

## 2015-07-15 NOTE — Plan of Care (Signed)
Problem: Alteration in thought process Goal: LTG-Patient has not harmed self or others in at least 2 days Outcome: Progressing Pt remains free from harm. Goal: STG-Patient is able to follow short directions Outcome: Progressing Pt follows directions from staff regarding medication administration.

## 2015-07-15 NOTE — Progress Notes (Signed)
Pre HD Tx Assessment 

## 2015-07-15 NOTE — Plan of Care (Signed)
Problem: Alteration in thought process Goal: LTG-Patient has not harmed self or others in at least 2 days Outcome: Progressing Pt safe on the unit at this time      

## 2015-07-15 NOTE — Progress Notes (Signed)
HD TX Initiation 

## 2015-07-15 NOTE — Progress Notes (Signed)
[facility]  ROUNDING NOTE   Subjective:   Seen and examined on hemodialysis. Tolerated treatment well. UF goal of even  Objective:  Vital signs in last 24 hours:  Temp:  [98.1 F (36.7 C)] 98.1 F (36.7 C) (03/08 1000) Pulse Rate:  [68-96] 92 (03/08 1130) Resp:  [18-23] 20 (03/08 1130) BP: (95-151)/(53-88) 100/53 mmHg (03/08 1130) SpO2:  [95 %-99 %] 95 % (03/08 1130)  Weight change:  Filed Weights   07/13/15 2115  Weight: 81.5 kg (179 lb 10.8 oz)    Intake/Output: I/O last 3 completed shifts: In: 240 [P.O.:240] Out: -    Intake/Output this shift:  Total I/O In: 240 [P.O.:240] Out: -   Physical Exam: General: NAD,   Head: Normocephalic, atraumatic. Moist oral mucosal membranes  Eyes: Anicteric, PERRL  Neck: Supple, trachea midline  Lungs:  Clear to auscultation  Heart: Regular rate and rhythm  Abdomen:  Soft, nontender,   Extremities: no peripheral edema.  Neurologic: Nonfocal, moving all four extremities  Skin: No lesions  Access: Right arm AVF    Basic Metabolic Panel:  Recent Labs Lab 07/11/15 2202 07/13/15 1459  NA 143 139  K 3.7 4.8  CL 102 104  CO2 25 24  GLUCOSE 80 153*  BUN 67* 71*  CREATININE 5.97* 6.03*  CALCIUM 9.8 9.0  PHOS  --  5.9*    Liver Function Tests:  Recent Labs Lab 07/11/15 2202 07/13/15 1459  AST 20  --   ALT 15  --   ALKPHOS 104  --   BILITOT 0.7  --   PROT 7.1  --   ALBUMIN 3.2* 2.9*   No results for input(s): LIPASE, AMYLASE in the last 168 hours. No results for input(s): AMMONIA in the last 168 hours.  CBC:  Recent Labs Lab 07/11/15 2202 07/13/15 1458  WBC 7.0 7.9  HGB 10.6* 10.4*  HCT 34.7* 34.9*  MCV 96.9 97.5  PLT 372 392    Cardiac Enzymes: No results for input(s): CKTOTAL, CKMB, CKMBINDEX, TROPONINI in the last 168 hours.  BNP: Invalid input(s): POCBNP  CBG:  Recent Labs Lab 07/14/15 2103  GLUCAP 12*    Microbiology: Results for orders placed or performed during  the hospital encounter of 07/03/15  Blood Culture (routine x 2)     Status: None   Collection Time: 07/03/15  2:30 PM  Result Value Ref Range Status   Specimen Description BLOOD LEFT FOREARM  Final   Special Requests BOTTLES DRAWN AEROBIC AND ANAEROBIC 5CC  Final   Culture NO GROWTH 5 DAYS  Final   Report Status 07/08/2015 FINAL  Final  Culture, respiratory (NON-Expectorated)     Status: None   Collection Time: 07/03/15  6:20 PM  Result Value Ref Range Status   Specimen Description TRACHEAL ASPIRATE  Final   Special Requests Normal  Final   Gram Stain   Final    MODERATE WBC PRESENT,BOTH PMN AND MONONUCLEAR RARE SQUAMOUS EPITHELIAL CELLS PRESENT NO ORGANISMS SEEN Performed at [facility]    Culture   Final    NORMAL OROPHARYNGEAL FLORA Performed at [facility]    Report Status 07/06/2015 FINAL  Final  MRSA PCR Screening     Status: None   Collection Time: 07/03/15  7:14 PM  Result Value Ref Range Status   MRSA by PCR NEGATIVE NEGATIVE Final    Comment:        The GeneXpert MRSA Assay (FDA approved for NASAL specimens only), is one  component of a comprehensive MRSA colonization surveillance program. It is not intended to diagnose MRSA infection nor to guide or monitor treatment for MRSA infections.   Blood Culture (routine x 2)     Status: None   Collection Time: 07/03/15  8:00 PM  Result Value Ref Range Status   Specimen Description BLOOD RIGHT HAND  Final   Special Requests BOTTLES DRAWN AEROBIC ONLY [place]  Final   Culture NO GROWTH 5 DAYS  Final   Report Status 07/08/2015 FINAL  Final  Respiratory virus panel     Status: Abnormal   Collection Time: 07/05/15  5:57 PM  Result Value Ref Range Status   Respiratory Syncytial Virus A Negative Negative Final   Respiratory Syncytial Virus B Negative Negative Final   Influenza A Negative Negative Final   Influenza B Negative Negative Final   Parainfluenza 1 Negative Negative Final   Parainfluenza 2  Negative Negative Final   Parainfluenza 3 Negative Negative Final   Metapneumovirus Positive (A) Negative Final   Rhinovirus Negative Negative Final   Adenovirus Negative Negative Final    Comment: (NOTE) Performed At: [facility] [address] [place], [place] [id number] Lindon Romp MD [id number]     Coagulation Studies: No results for input(s): LABPROT, INR in the last 72 hours.  Urinalysis: No results for input(s): COLORURINE, LABSPEC, PHURINE, GLUCOSEU, HGBUR, BILIRUBINUR, KETONESUR, PROTEINUR, UROBILINOGEN, NITRITE, LEUKOCYTESUR in the last 72 hours.  Invalid input(s): APPERANCEUR    Imaging: Dg Chest 2 View  07/14/2015  CLINICAL DATA:  Followup pneumonia EXAM: CHEST  2 VIEW COMPARISON:  07/11/2015 FINDINGS: Cardiac shadow remains enlarged. The lungs are again well aerated with atelectatic changes in the right base and left mid lung stable from the recent exam. No other focal abnormality is seen. IMPRESSION: Stable atelectatic changes bilaterally. Electronically Signed   By: Inez Catalina M.D.   On: 07/14/2015 11:42     Medications:     . b complex-vitamin c-folic acid  1 tablet Oral QHS  . cinacalcet  60 mg Oral Q breakfast  . divalproex  2,500 mg Oral QHS  . docusate sodium  200 mg Oral BID  . hydrocortisone   Rectal BID  . LORazepam  2 mg Oral QHS  . metoprolol tartrate  25 mg Oral BID  . mometasone-formoterol  2 puff Inhalation BID  . nicotine  21 mg Transdermal Q0600  . pantoprazole  40 mg Oral Daily  . sevelamer carbonate  1,600 mg Oral TID WC  . simvastatin  10 mg Oral q1800  . ziprasidone  80 mg Oral BID WC   acetaminophen, albuterol, bisacodyl, diphenhydrAMINE **OR** diphenhydrAMINE, fluPHENAZine, fluPHENAZine, LORazepam **OR** LORazepam, magnesium hydroxide  Assessment/ Plan:  [name] is a 55 y.o. black female with ESRD on HD since 1/16 followed by [facility], COPD, secondary hyperparathyroidism, anemia of CKD, bipolar  affective disorder  1. ESRD on HD TTS: seen and examined on hemodialysis. MWF during this hospitalization - Continue TTS schedule - next treatment tomorrow for 2 hours.   2. Anemia of CKD: hemoglobin 10.4 - holding epo. [facility] as outpatient.   3. Secondary hyperparathyroidism of renal origin: phosphorus elevated at 5.9 - sevelamer for binding.  - cinacalcet  4. Hypertension: On metoprolol. Lower today, will continue to monitor.     LOS: 2 Teiara Baria 3/8/201711:42 AM

## 2015-07-15 NOTE — Progress Notes (Signed)
Recreation Therapy Notes  Date: 03.08.17 Time: 3:00 pm Location: Craft Room  Group Topic: Self-esteem  Goal Area(s) Addresses:  Patient will identify positive attributes about self. Patient will identify at least one coping skill.  Behavioral Response: Attentive, Left early  Intervention: All About Me  Activity: Patients were instructed to make an All About Me pamphlet including their life goal, positive traits, healthy coping skills, and their support system.  Education: LRT educated patients on ways they can increase their self-esteem.  Education Outcome: Patient left before LRT educated group.  Clinical Observations/Feedback: Patient completed activity by writing her life's motto. Patient left group at approximately 3:22 pm with Dr. Jerilee Hoh. Patient did not return to group.  Leonette Monarch, LRT/CTRS 07/15/2015 4:17 PM
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
## 2015-07-15 NOTE — BHH Group Notes (Signed)
[facility] LCSW Aftercare Discharge Planning Group Note  07/15/2015 9:15 AM  Participation Quality: Did Not Attend. Patient invited to participate but declined.   Alphonse Guild. Joeziah Voit, MSW, LCSWA, LCAS

## 2015-07-15 NOTE — BHH Group Notes (Signed)
[facility] Group Notes:  (Nursing/MHT/Case Management/Adjunct)  Date:  07/15/2015  Time:  7:05 PM  Type of Therapy:  Psychoeducational Skills  Participation Level:  Active  Participation Quality:  Appropriate  Affect:  Appropriate  Cognitive:  Appropriate  Insight:  Appropriate  Engagement in Group:  Engaged  Modes of Intervention:  Discussion, Education and Support  Summary of Progress/Problems:  Jennifer Lawson 07/15/2015, 7:05 PM

## 2015-07-15 NOTE — Progress Notes (Signed)
D: Pt denies SI/HI/AVH. Pt is very needy and labile. Pt persistent with wants and has lack of understanding of boundaries. Pt is argumentative and tries to be manipulative  And when she does not get her way she becomes emotional.   A: Pt was offered support and encouragement. Pt was given scheduled medications. Pt was encourage to attend groups. Q 15 minute checks were done for safety.   R:Pt attends groups and interacts well with peers and staff. Pt is taking medication. Pt receptive to treatment and safety maintained on unit.

## 2015-07-15 NOTE — BHH Group Notes (Signed)
[facility] LCSW Group Therapy   07/15/2015 1pm  Type of Therapy: Group Therapy   Participation Level: Did Not Attend. Patient invited to participate but declined.    Alphonse Guild. Bionca Mckey, MSW, LCSWA, LCAS

## 2015-07-15 NOTE — Progress Notes (Signed)
D: Pt affect continues to be irritable and labile. Pt remains fixated on having oxygen, stating that she has pneumonia and COPD. Pt requires frequent redirection from staff. She often gets agitated with staff when given instructions, stating "you need your license revoked, you don't know what you're doing." Pt states that she "almost fell getting up from bed" and requests a walker. This evening, pt refused to take the full 2,500 mg of depakote that was prescribed and instead took 2,000 mg. A: Emotional support and encouragement provided. Pt educated on fall prevention. Medications administered as prescribed. q15 minute safety checks maintained. R: Pt remains free from harm.

## 2015-07-16 LAB — HEPATITIS B SURFACE ANTIGEN: Hepatitis B Surface Ag: NEGATIVE

## 2015-07-16 LAB — PARATHYROID HORMONE, INTACT (NO CA): PTH: 264 pg/mL — ABNORMAL HIGH (ref 15–65)

## 2015-07-16 MED ORDER — SENNA 8.6 MG PO TABS
2.0000 | ORAL_TABLET | Freq: Every day | ORAL | Status: DC
Start: 2015-07-16 — End: 2015-07-23
  Administered 2015-07-16 – 2015-07-22 (×7): 17.2 mg via ORAL
  Filled 2015-07-16 (×7): qty 2
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
## 2015-07-16 NOTE — Tx Team (Signed)
Interdisciplinary Treatment Plan Update (Adult)  Date:  07/16/2015 Time Reviewed:  2:31 PM  Progress in Treatment: Attending groups: No.  Participating in groups:  No. Taking medication as prescribed:  Yes. Tolerating medication:  Yes. Family/Significant othe contact made:  No, will contact:  patient's sister Patient understands diagnosis:  No.  Discussing patient identified problems/goals with staff:  Yes. Medical problems stabilized or resolved:  Yes. Denies suicidal/homicidal ideation: Yes. Issues/concerns per patient self-inventory:  Yes. Other:  New problem(s) identified: No, Describe:  none reported  Discharge Plan or Barriers: Patient will stabilize on medications and discharge home. Patient will need hospital follow up schedule for outpatient services at discharge.  Reason for Continuation of Hospitalization: Aggression Mania Medication stabilization  Comments:   Estimated length of stay: up to 7 days expected to discharge Thursday 07/23/15  New goal(s):  Review of initial/current patient goals per problem list:   1.  Goal(s):participate in aftercare plan  Met:  Yes  Target date:by discharge  As evidenced HO:[id number] will participate in aftercare plan AEB aftercare provider and housing plan identified at discharge 07/14/15: patient will discharge home and will follow up with Dr. Wonda Amis at [facility] in [place] at discharge. Goal met.   2.  Goal (s):decrease psychosis  Met:  No  Target date:by discharge  As evidenced by: demonstrates decreased signs of pschosis 07/14/15: Patient was aggressive with staff in ER and with EMS and stating she does not want dialysis and needs med stabilization as she stopped taking meds that were affecting patient kidney who is a dialysis patient 07/16/15: Patient remains disorganized and needs continued hospitalization.  2.  Goal (s): Decrease mania   Met:  No  Target date:by discharge  As evidenced by: patient  demonstrates decreased symptoms of mania 07/14/15: Patient is admitted for psychosis and aggressive behavior and will need med stabilization. 07/16/15: Patient still disorganized and wanting to discharge and states that she does not have any psychiatric needs.   Attendees: Physician:  Merlyn Albert, MD 1/25/201710:20 AM  Nursing:   Carolynn Sayers, RN 1/25/201710:20 AM  Other:  Carmell Austria, LCSWA 1/25/201710:20 AM  Other:   1/25/201710:20 AM  Other:   1/25/201710:20 AM  Other:  1/25/201710:20 AM  Other:  1/25/201710:20 AM  Other:  1/25/201710:20 AM  Other:  1/25/201710:20 AM  Other:  1/25/201710:20 AM  Other:  1/25/201710:20 AM  Other:   1/25/201710:20 AM    Scribe for Treatment Team:   Keene Breath, MSW, LCSWA   07/16/2015, 2:31 PM  [phone]

## 2015-07-16 NOTE — Progress Notes (Signed)
Pre-hd tx 

## 2015-07-16 NOTE — Progress Notes (Signed)
Post hd tx 

## 2015-07-16 NOTE — Progress Notes (Signed)
Patient calm and cooperative this am. Dialysis scheduled for 0830. No SI/HI or verbal aggression at this time. Sitter arrives and patient transported via wheelchair to dialysis. AM meds held rt time of dialysis. No distress, no complaint. Remains on fall risk protocol and safety maintained.

## 2015-07-16 NOTE — Plan of Care (Signed)
Problem: Consults Goal: Aggression Patient Education See Patient Education Module for education specifics.  Outcome: Progressing Calm and cooperative at this time.

## 2015-07-16 NOTE — Progress Notes (Signed)
[facility]  ROUNDING NOTE   Subjective:   Seen and examined on hemodialysis. Tolerated treatment well.   Objective:  Vital signs in last 24 hours:  Temp:  [97.8 F (36.6 C)-98.9 F (37.2 C)] 98.8 F (37.1 C) (03/09 0910) Pulse Rate:  [79-92] 79 (03/09 1100) Resp:  [17-20] 18 (03/09 1100) BP: (100-119)/(53-82) 108/69 mmHg (03/09 1100) SpO2:  [95 %-100 %] 100 % (03/09 1100)  Weight change:  Filed Weights   07/13/15 2115  Weight: 81.5 kg (179 lb 10.8 oz)    Intake/Output: I/O last 3 completed shifts: In: 840 [P.O.:840] Out: 500 [Other:500]   Intake/Output this shift:  Total I/O In: 600 [P.O.:600] Out: -   Physical Exam: General: NAD,   Head: Normocephalic, atraumatic. Moist oral mucosal membranes  Eyes: Anicteric, PERRL  Neck: Supple, trachea midline  Lungs:  Clear to auscultation  Heart: Regular rate and rhythm  Abdomen:  Soft, nontender,   Extremities: no peripheral edema.  Neurologic: Nonfocal, moving all four extremities  Skin: No lesions  Access: Right arm AVF    Basic Metabolic Panel:  Recent Labs Lab 07/11/15 2202 07/13/15 1459 07/15/15 1122  NA 143 139 134*  K 3.7 4.8 3.7  CL 102 104 97*  CO2 25 24 27   GLUCOSE 80 153* 104*  BUN 67* 71* 50*  CREATININE 5.97* 6.03* 5.03*  CALCIUM 9.8 9.0 8.6*  PHOS  --  5.9* 5.1*    Liver Function Tests:  Recent Labs Lab 07/11/15 2202 07/13/15 1459 07/15/15 1122  AST 20  --   --   ALT 15  --   --   ALKPHOS 104  --   --   BILITOT 0.7  --   --   PROT 7.1  --   --   ALBUMIN 3.2* 2.9* 2.9*   No results for input(s): LIPASE, AMYLASE in the last 168 hours. No results for input(s): AMMONIA in the last 168 hours.  CBC:  Recent Labs Lab 07/11/15 2202 07/13/15 1458 07/15/15 1123  WBC 7.0 7.9 6.3  HGB 10.6* 10.4* 10.0*  HCT 34.7* 34.9* 31.3*  MCV 96.9 97.5 97.7  PLT 372 392 288    Cardiac Enzymes: No results for input(s): CKTOTAL, CKMB, CKMBINDEX, TROPONINI in the last 168  hours.  BNP: Invalid input(s): POCBNP  CBG:  Recent Labs Lab 07/14/15 2103  GLUCAP 26*    Microbiology: Results for orders placed or performed during the hospital encounter of 07/03/15  Blood Culture (routine x 2)     Status: None   Collection Time: 07/03/15  2:30 PM  Result Value Ref Range Status   Specimen Description BLOOD LEFT FOREARM  Final   Special Requests BOTTLES DRAWN AEROBIC AND ANAEROBIC 5CC  Final   Culture NO GROWTH 5 DAYS  Final   Report Status 07/08/2015 FINAL  Final  Culture, respiratory (NON-Expectorated)     Status: None   Collection Time: 07/03/15  6:20 PM  Result Value Ref Range Status   Specimen Description TRACHEAL ASPIRATE  Final   Special Requests Normal  Final   Gram Stain   Final    MODERATE WBC PRESENT,BOTH PMN AND MONONUCLEAR RARE SQUAMOUS EPITHELIAL CELLS PRESENT NO ORGANISMS SEEN Performed at [facility]    Culture   Final    NORMAL OROPHARYNGEAL FLORA Performed at [facility]    Report Status 07/06/2015 FINAL  Final  MRSA PCR Screening     Status: None   Collection Time: 07/03/15  7:14 PM  Result Value Ref Range Status   MRSA by PCR NEGATIVE NEGATIVE Final    Comment:        The GeneXpert MRSA Assay (FDA approved for NASAL specimens only), is one component of a comprehensive MRSA colonization surveillance program. It is not intended to diagnose MRSA infection nor to guide or monitor treatment for MRSA infections.   Blood Culture (routine x 2)     Status: None   Collection Time: 07/03/15  8:00 PM  Result Value Ref Range Status   Specimen Description BLOOD RIGHT HAND  Final   Special Requests BOTTLES DRAWN AEROBIC ONLY [place]  Final   Culture NO GROWTH 5 DAYS  Final   Report Status 07/08/2015 FINAL  Final  Respiratory virus panel     Status: Abnormal   Collection Time: 07/05/15  5:57 PM  Result Value Ref Range Status   Respiratory Syncytial Virus A Negative Negative Final   Respiratory Syncytial Virus B  Negative Negative Final   Influenza A Negative Negative Final   Influenza B Negative Negative Final   Parainfluenza 1 Negative Negative Final   Parainfluenza 2 Negative Negative Final   Parainfluenza 3 Negative Negative Final   Metapneumovirus Positive (A) Negative Final   Rhinovirus Negative Negative Final   Adenovirus Negative Negative Final    Comment: (NOTE) Performed At: [facility] [address] [place], [place] [id number] Lindon Romp MD [id number]     Coagulation Studies: No results for input(s): LABPROT, INR in the last 72 hours.  Urinalysis: No results for input(s): COLORURINE, LABSPEC, PHURINE, GLUCOSEU, HGBUR, BILIRUBINUR, KETONESUR, PROTEINUR, UROBILINOGEN, NITRITE, LEUKOCYTESUR in the last 72 hours.  Invalid input(s): APPERANCEUR    Imaging: No results found.   Medications:     . b complex-vitamin c-folic acid  1 tablet Oral QHS  . cinacalcet  60 mg Oral Q breakfast  . divalproex  2,000 mg Oral QHS  . docusate sodium  200 mg Oral BID  . hydrocortisone   Rectal BID  . LORazepam  2 mg Oral QHS  . metoprolol tartrate  25 mg Oral BID  . mometasone-formoterol  2 puff Inhalation BID  . nicotine  21 mg Transdermal Q0600  . pantoprazole  40 mg Oral Daily  . sevelamer carbonate  1,600 mg Oral TID WC  . simvastatin  10 mg Oral q1800  . ziprasidone  80 mg Oral BID WC   acetaminophen, albuterol, bisacodyl, diphenhydrAMINE **OR** diphenhydrAMINE, fluPHENAZine, fluPHENAZine, LORazepam **OR** LORazepam, magnesium hydroxide  Assessment/ Plan:  [name] is a 55 y.o. black female with ESRD on HD since 1/16 followed by [facility], COPD, secondary hyperparathyroidism, anemia of CKD, bipolar affective disorder  1. ESRD on HD TTS: seen and examined on hemodialysis. MWF during this hospitalization - Resume TTS schedule  2. Anemia of CKD: hemoglobin 10 - holding epo. [facility]era as outpatient.   3. Secondary hyperparathyroidism of  renal origin: phosphorus elevated at 5.1 - sevelamer for binding.  - cinacalcet  4. Hypertension: On metoprolol. Lower today, will continue to monitor.     LOS: [place], [place] 3/9/201711:23 AM

## 2015-07-16 NOTE — Progress Notes (Addendum)
Writer receives report from Dialysis, 500 ml fluid removed. Patient to have next Dialysis on Saturday. Patient eats lunch, takes meds as ordered. Multiple requests are met by Probation officer. Prn Tylenol and Benydryl with good effect. Patient angry dietary will not send her a chef salad. Resting in room. Safety maintained.

## 2015-07-16 NOTE — B[facility] Group Notes (Signed)
[facility] Group Notes:  (Nursing/MHT/Case Management/Adjunct)  Date:  07/16/2015  Time:  3:43 PM  Type of Therapy:  Psychoeducational Skills  Participation Level:  Did Not Attend  Celso Amy 07/16/2015, 3:43 PM

## 2015-07-16 NOTE — B[facility] Group Notes (Signed)
[facility] Group Notes:  (Nursing/MHT/Case Management/Adjunct)  Date:  07/16/2015  Time:  12:52 AM  Type of Therapy:  Group Therapy  Participation Level:  Active  Participation Quality:  Appropriate  Affect:  Appropriate  Cognitive:  Appropriate  Insight:  Appropriate  Engagement in Group:  Engaged  Modes of Intervention:  n/a  Summary of Progress/Problems:  Jennifer Lawson 07/16/2015, 12:52 AM

## 2015-07-16 NOTE — Progress Notes (Signed)
Hemodialysis start 

## 2015-07-16 NOTE — Progress Notes (Signed)
Recreation Therapy Notes  At approximately 11:10 am, LRT attempted assessment. Patient in dialysis at this time.  Leonette Monarch, LRT/CTRS 07/16/2015 11:21 AM

## 2015-07-16 NOTE — Progress Notes (Signed)
Recreation Therapy Notes  Date: 03.09.17 Time: 1:00 pm Location: Craft Room  Group Topic: Leisure Education/Coping Skills  Goal Area(s) Addresses:  Patient will identify things they are grateful for.  Patient will identify how being grateful can influence decision making.  Behavioral Response: Did not attend  Intervention: Grateful Wheel  Activity: Patients were given an I Am Grateful For worksheet and encouraged to list 2-3 things they were grateful for under each category.  Education: LRT educated patient on leisure and why it is a good Technical sales engineer.  Education Outcome: Patient did not attend group.   Clinical Observations/Feedback: Patient did not attend group.  Leonette Monarch, LRT/CTRS 07/16/2015 2:51 PM

## 2015-07-16 NOTE — Progress Notes (Signed)
Hemodialysis completed. 

## 2015-07-16 NOTE — [facility] Group Notes (Signed)
[facility] LCSW Group Therapy   07/16/2015 1pm  Type of Therapy: Group Therapy   Participation Level: Did Not Attend. Patient invited to participate but declined.    Jennifer Lawson. Jennifer Lawson, MSW, LCSWA, LCAS

## 2015-07-17 MED ORDER — TRAMADOL HCL 50 MG PO TABS
50.0000 mg | ORAL_TABLET | Freq: Every day | ORAL | Status: DC | PRN
  Administered 2015-07-17 – 2015-07-20 (×4): 50 mg via ORAL
  Filled 2015-07-17 (×4): qty 1

## 2015-07-17 NOTE — [facility] Group Notes (Signed)
[facility] LCSW Group Therapy   07/17/2015 1pm  Type of Therapy: Group Therapy   Participation Level: Did Not Attend. Patient invited to participate but declined.    Alphonse Guild. Marielle Mantione, MSW, LCSWA, LCAS

## 2015-07-17 NOTE — Progress Notes (Signed)
D: pt woke up at approx 141am stating she couldn't sleep A:  Gave ativan to help sleep and anxiety. Encouraged pt use relaxation techniques to aid in sleep. R: Pt fell asleep shortly after going back to bed.

## 2015-07-17 NOTE — Progress Notes (Signed)
D: Patient alert and oriented. Patient denies SI/HI/AVH. Pt affect anxious, irritable, labile. Pt has multiple complaints about food.  She was told that the diet was ordered by Dr. Jerilee Hoh after consulting with the dietition.   A: Offered active listening and support. Provided therapeutic communication. Administered scheduled medications. Redirected pt multiple times. Encouraged pt to attend groups and participate in care.  Discussed appropriate ways to interact with staff on the unit. Gave tylenol and ativan as ordered PRN  for anxiety  R: Pt cooperative after  redirection.Pt medication compliant. Will continue Q15 min. checks. Safety maintained.

## 2015-07-17 NOTE — Plan of Care (Signed)
Problem: Aggression Towards others,Towards Self, and or Destruction Goal: STG-Patient will comply with prescribed medication regimen (Patient will comply with prescribed medication regimen)  Outcome: Progressing Pt complying with medication regimen.

## 2015-07-17 NOTE — Progress Notes (Signed)
Recreation Therapy Notes  Date: 03.10.17 Time: 3:00 pm Location: Craft Room  Group Topic: Communication, Problem solving, Teamwork  Goal Area(s) Addresses:  Patient will work in teams towards shared goal. Patient will verbalize skills needed to make activity successful. Patient will verbalize benefit of using skills identified to reach post d/c goals.  Behavioral Response: Did not attend  Intervention: Landing Pad  Activity: Patients were put in groups and instructed to build a contraption to catch the golf ball with 15 straws and approximately 2.5 feet of tape.  Education: LRT educated patients on healthy support systems.  Education Outcome: Patient did not attend group.   Clinical Observations/Feedback: Patient did not attend group.  Leonette Monarch, LRT/CTRS 07/17/2015 4:02 PM

## 2015-07-17 NOTE — Progress Notes (Signed)
D: Observed pt at nursing station. Patient alert and oriented. Patient denies SI/HI/AVH. Pt affect anxious, irritable, labile. Pt is very needy coming to the nursing station multiple times asking the same questions over and over again to staff. Pt has no insight into mental health needs and stated "I really shouldn't be here." Pt got very angry and accused someone of stealing pajamas. Pt c/o of generalized pain and difficulty sleeping.  A: Offered active listening and support. Provided therapeutic communication. Administered scheduled medications. Redirected pt multiple times. Encouraged pt to attend groups and participate in care. Helped pt find pajamas. Discussed appropriate ways to interact with staff on the unit. Gave tylenol and benadryl for sleep. R: Pt cooperative after strong redirection. Pt was apologetic after pajamas were found on bed. Pt  medication compliant. Will continue Q15 min. checks. Safety maintained.
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
## 2015-07-17 NOTE — [facility] Group Notes (Signed)
[place] Group Notes:  (Nursing/MHT/Case Management/Adjunct)  Date:  07/17/2015  Time:  1:55 AM  Type of Therapy:  Group Therapy  Participation Level:  Active  Participation Quality:  Appropriate  Affect:  Appropriate  Cognitive:  Appropriate  Insight:  Appropriate  Engagement in Group:  Engaged  Modes of Intervention:  n/a  Summary of Progress/Problems:  Jennifer Lawson 07/17/2015, 1:55 AM

## 2015-07-17 NOTE — [facility] Group Notes (Signed)
[place] Group Notes:  (Nursing/MHT/Case Management/Adjunct)  Date:  07/17/2015  Time:  12:23 PM  Type of Therapy:  Psychoeducational Skills  Participation Level:  Did Not Attend    Drake Leach 07/17/2015, 12:23 PM

## 2015-07-17 NOTE — B[facility] Group Notes (Signed)
[facility] LCSW Aftercare Discharge Planning Group Note   07/17/2015 12:49 PM  Participation Quality: Did not attend.   [place] MSW, LCSWA

## 2015-07-18 LAB — RENAL FUNCTION PANEL
Albumin: 2.8 g/dL — ABNORMAL LOW (ref 3.5–5.0)
Anion gap: 9 (ref 5–15)
BUN: 46 mg/dL — ABNORMAL HIGH (ref 6–20)
CO2: 27 mmol/L (ref 22–32)
Calcium: 8.6 mg/dL — ABNORMAL LOW (ref 8.9–10.3)
Chloride: 94 mmol/L — ABNORMAL LOW (ref 101–111)
Creatinine, Ser: 4.25 mg/dL — ABNORMAL HIGH (ref 0.44–1.00)
GFR calc Af Amer: 13 mL/min — ABNORMAL LOW (ref >60)
GFR calc non Af Amer: 11 mL/min — ABNORMAL LOW (ref >60)
Glucose, Bld: 105 mg/dL — ABNORMAL HIGH (ref 65–99)
Phosphorus: 5.8 mg/dL — ABNORMAL HIGH (ref 2.5–4.6)
Potassium: 4.3 mmol/L (ref 3.5–5.1)
Sodium: 130 mmol/L — ABNORMAL LOW (ref 135–145)

## 2015-07-18 LAB — CBC
HCT: 32 % — ABNORMAL LOW (ref 35.0–47.0)
Hemoglobin: 10.3 g/dL — ABNORMAL LOW (ref 12.0–16.0)
MCH: 31 pg (ref 26.0–34.0)
MCHC: 32.2 g/dL (ref 32.0–36.0)
MCV: 96.3 fL (ref 80.0–100.0)
Platelets: 248 10*3/uL (ref 150–440)
RBC: 3.33 MIL/uL — ABNORMAL LOW (ref 3.80–5.20)
RDW: 22.7 % — ABNORMAL HIGH (ref 11.5–14.5)
WBC: 4.8 10*3/uL (ref 3.6–11.0)

## 2015-07-18 NOTE — Plan of Care (Signed)
Problem: Alteration in mood Goal: LTG-Patient reports reduction in suicidal thoughts (Patient reports reduction in suicidal thoughts and is able to verbalize a safety plan for whenever patient is feeling suicidal)  Outcome: Not Progressing Denies SI
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
## 2015-07-18 NOTE — [facility] Group Notes (Signed)
[facility] LCSW Group Therapy  07/18/2015 4:14 PM  Type of Therapy:  Group Therapy  Participation Level:  Did Not Attend  Modes of Intervention:  Discussion, Education, Socialization and Support  Summary of Progress/Problems: Balance in life: Patients will discuss the concept of balance and how it looks and feels to be unbalanced. Pt will identify areas in their life that is unbalanced and ways to become more balanced.    Andrey Hoobler L Japleen Tornow MSW, LCSWA  07/18/2015, 4:14 PM  

## 2015-07-18 NOTE — Plan of Care (Signed)
Problem: Alteration in mood Goal: LTG-Patient reports reduction in suicidal thoughts (Patient reports reduction in suicidal thoughts and is able to verbalize a safety plan for whenever patient is feeling suicidal)  Outcome: Progressing Patient denies SI at this time.      

## 2015-07-18 NOTE — Progress Notes (Signed)
Pleasant and cooperative.  Medicated x2 for pain.  Dialyzed today without any complications.  Denies SI/HI/AVH.  Support and encouragement offered.  Safety maintained.

## 2015-07-18 NOTE — Progress Notes (Signed)
D: Patient still very labile and needy. She is slightly disorganized and has to be redirected. Denies SI/HI/AVH. Complains of not being able to sleep. Constantly rates her pain at a 10.  A: Medication given with education. Encouragement provided. PRN Ativan given for sleep.  R: Patient was compliant with medication. She has remained calm and cooperative. Safety maintained with 15 min checks.

## 2015-07-18 NOTE — Progress Notes (Signed)
[facility]  ROUNDING NOTE   Subjective:   Seen and examined on hemodialysis. Tolerated treatment well.   Objective:  Vital signs in last 24 hours:  Temp:  [98 F (36.7 C)-98.4 F (36.9 C)] 98.4 F (36.9 C) (03/11 1120) Pulse Rate:  [73-80] 73 (03/11 1230) Resp:  [18] 18 (03/11 1230) BP: (104-144)/(58-89) 104/64 mmHg (03/11 1230) SpO2:  [100 %] 100 % (03/11 1120)  Weight change:  Filed Weights   07/16/15 0910 07/16/15 1155  Weight: 85 kg (187 lb 6.3 oz) 84.6 kg (186 lb 8.2 oz)    Intake/Output: I/O last 3 completed shifts: In: 31 [P.O.:960] Out: -    Intake/Output this shift:  Total I/O In: 360 [P.O.:360] Out: -   Physical Exam: General: NAD  Head: Normocephalic, atraumatic. Moist oral mucosal membranes  Eyes: Anicteric, PERRL  Neck: Supple, trachea midline  Lungs:  Clear to auscultation  Heart: Regular rate and rhythm  Abdomen:  Soft, nontender  Extremities: no peripheral edema.  Neurologic: Nonfocal, moving all four extremities  Skin: No lesions  Access: Right arm AVF    Basic Metabolic Panel:  Recent Labs Lab 07/11/15 2202 07/13/15 1459 07/15/15 1122 07/18/15 1122  NA 143 139 134* 130*  K 3.7 4.8 3.7 4.3  CL 102 104 97* 94*  CO2 25 24 27 27   GLUCOSE 80 153* 104* 105*  BUN 67* 71* 50* 46*  CREATININE 5.97* 6.03* 5.03* 4.25*  CALCIUM 9.8 9.0 8.6* 8.6*  PHOS  --  5.9* 5.1* 5.8*    Liver Function Tests:  Recent Labs Lab 07/11/15 2202 07/13/15 1459 07/15/15 1122 07/18/15 1122  AST 20  --   --   --   ALT 15  --   --   --   ALKPHOS 104  --   --   --   BILITOT 0.7  --   --   --   PROT 7.1  --   --   --   ALBUMIN 3.2* 2.9* 2.9* 2.8*   No results for input(s): LIPASE, AMYLASE in the last 168 hours. No results for input(s): AMMONIA in the last 168 hours.  CBC:  Recent Labs Lab 07/11/15 2202 07/13/15 1458 07/15/15 1123 07/18/15 1122  WBC 7.0 7.9 6.3 4.8  HGB 10.6* 10.4* 10.0* 10.3*  HCT 34.7* 34.9* 31.3* 32.0*  MCV  96.9 97.5 97.7 96.3  PLT 372 392 288 248    Cardiac Enzymes: No results for input(s): CKTOTAL, CKMB, CKMBINDEX, TROPONINI in the last 168 hours.  BNP: Invalid input(s): POCBNP  CBG:  Recent Labs Lab 07/14/15 2103  GLUCAP 60*    Microbiology: Results for orders placed or performed during the hospital encounter of 07/03/15  Blood Culture (routine x 2)     Status: None   Collection Time: 07/03/15  2:30 PM  Result Value Ref Range Status   Specimen Description BLOOD LEFT FOREARM  Final   Special Requests BOTTLES DRAWN AEROBIC AND ANAEROBIC 5CC  Final   Culture NO GROWTH 5 DAYS  Final   Report Status 07/08/2015 FINAL  Final  Culture, respiratory (NON-Expectorated)     Status: None   Collection Time: 07/03/15  6:20 PM  Result Value Ref Range Status   Specimen Description TRACHEAL ASPIRATE  Final   Special Requests Normal  Final   Gram Stain   Final    MODERATE WBC PRESENT,BOTH PMN AND MONONUCLEAR RARE SQUAMOUS EPITHELIAL CELLS PRESENT NO ORGANISMS SEEN Performed at [employer]  Final    NORMAL OROPHARYNGEAL FLORA Performed at [facility]    Report Status 07/06/2015 FINAL  Final  MRSA PCR Screening     Status: None   Collection Time: 07/03/15  7:14 PM  Result Value Ref Range Status   MRSA by PCR NEGATIVE NEGATIVE Final    Comment:        The GeneXpert MRSA Assay (FDA approved for NASAL specimens only), is one component of a comprehensive MRSA colonization surveillance program. It is not intended to diagnose MRSA infection nor to guide or monitor treatment for MRSA infections.   Blood Culture (routine x 2)     Status: None   Collection Time: 07/03/15  8:00 PM  Result Value Ref Range Status   Specimen Description BLOOD RIGHT HAND  Final   Special Requests BOTTLES DRAWN AEROBIC ONLY [place]  Final   Culture NO GROWTH 5 DAYS  Final   Report Status 07/08/2015 FINAL  Final  Respiratory virus panel     Status: Abnormal   Collection Time:  07/05/15  5:57 PM  Result Value Ref Range Status   Respiratory Syncytial Virus A Negative Negative Final   Respiratory Syncytial Virus B Negative Negative Final   Influenza A Negative Negative Final   Influenza B Negative Negative Final   Parainfluenza 1 Negative Negative Final   Parainfluenza 2 Negative Negative Final   Parainfluenza 3 Negative Negative Final   Metapneumovirus Positive (A) Negative Final   Rhinovirus Negative Negative Final   Adenovirus Negative Negative Final    Comment: (NOTE) Performed At: [facility] [address] [place], [place] [id number] Lindon Romp MD [id number]     Coagulation Studies: No results for input(s): LABPROT, INR in the last 72 hours.  Urinalysis: No results for input(s): COLORURINE, LABSPEC, PHURINE, GLUCOSEU, HGBUR, BILIRUBINUR, KETONESUR, PROTEINUR, UROBILINOGEN, NITRITE, LEUKOCYTESUR in the last 72 hours.  Invalid input(s): APPERANCEUR    Imaging: No results found.   Medications:     . b complex-vitamin c-folic acid  1 tablet Oral QHS  . cinacalcet  60 mg Oral Q breakfast  . divalproex  2,000 mg Oral QHS  . docusate sodium  200 mg Oral BID  . hydrocortisone   Rectal BID  . LORazepam  2 mg Oral QHS  . metoprolol tartrate  25 mg Oral BID  . mometasone-formoterol  2 puff Inhalation BID  . nicotine  21 mg Transdermal Q0600  . pantoprazole  40 mg Oral Daily  . senna  2 tablet Oral QHS  . sevelamer carbonate  1,600 mg Oral TID WC  . simvastatin  10 mg Oral q1800  . ziprasidone  80 mg Oral BID WC   acetaminophen, albuterol, diphenhydrAMINE **OR** diphenhydrAMINE, fluPHENAZine, fluPHENAZine, LORazepam **OR** LORazepam, magnesium hydroxide, traMADol  Assessment/ Plan:  [name] is a 55 y.o. black female with ESRD on HD since 1/16 followed by [facility], COPD, secondary hyperparathyroidism, anemia of CKD, bipolar affective disorder  1. ESRD on HD TTS: seen and examined on hemodialysis.   2.  Anemia of CKD: hemoglobin 10.3 - holding epo. Micera as outpatient.   3. Secondary hyperparathyroidism of renal origin: phosphorus elevated at 5.8 - not at goal.  - sevelamer for binding.  - cinacalcet  4. Hypertension: On metoprolol.     LOS: [place], [place] 3/11/201712:51 PM

## 2015-07-19 LAB — VALPROIC ACID LEVEL: Valproic Acid Lvl: 71 ug/mL (ref 50.0–100.0)

## 2015-07-19 MED ORDER — ONDANSETRON HCL 4 MG PO TABS
4.0000 mg | ORAL_TABLET | Freq: Four times a day (QID) | ORAL | Status: DC | PRN
  Administered 2015-07-19: 4 mg via ORAL
  Filled 2015-07-19: qty 1

## 2015-07-19 NOTE — Progress Notes (Signed)
D: Pt denies SI/AVH. Denies pain. Voices no concerns at this time. Was able to be easily redirected when needed. Noted to be less intrusive this evening. Cooperative and pleasant. Seen interacting appropriately in milieu. A: Encouragement and support provided. Q15 minute checks maintained for safety. Medications given as prescribed. R: Pt remains safe on unit. Appropriate with staff and peers. Will continue to monitor.
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
## 2015-07-19 NOTE — Plan of Care (Signed)
Problem: Ineffective individual coping Goal: LTG: Patient will report a decrease in negative feelings Outcome: Not Progressing Patient still fixated on having oxygen on. Unwilling to accept education given.
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
## 2015-07-19 NOTE — Progress Notes (Signed)
Patient noted to be needy, whiny, demanding to have oxygen and c/o feeling nauseated. Patient assessed, VS stable with O2 sats at 100%, denied SI/AH/VH, medication education done, Zofran PRN order obtained and administered. Patient continues to be fixated on wanting more oxygen even has no evidence of SOB. Requiring redirection and reassurance.

## 2015-07-19 NOTE — BHH Group Notes (Signed)
[place] LCSW Group Therapy  07/19/2015 2:10 PM  Type of Therapy:  Group Therapy  Participation Level:  Did Not Attend  Modes of Intervention:  Discussion, Education, Socialization and Support  Summary of Progress/Problems:Self esteem: Patients discussed self esteem and how it impacts them. They discussed what aspects in their lives has influenced their self esteem. They were challenged to identify changes that are needed in order to improve self esteem.    [place] MSW, [place]  07/19/2015, 2:10 PM

## 2015-07-20 ENCOUNTER — Inpatient Hospital Stay: Payer: 59

## 2015-07-20 LAB — BASIC METABOLIC PANEL
Anion gap: 8 (ref 5–15)
BUN: 47 mg/dL — ABNORMAL HIGH (ref 6–20)
CO2: 27 mmol/L (ref 22–32)
Calcium: 9.1 mg/dL (ref 8.9–10.3)
Chloride: 104 mmol/L (ref 101–111)
Creatinine, Ser: 4.21 mg/dL — ABNORMAL HIGH (ref 0.44–1.00)
GFR calc Af Amer: 13 mL/min — ABNORMAL LOW (ref >60)
GFR calc non Af Amer: 11 mL/min — ABNORMAL LOW (ref >60)
Glucose, Bld: 80 mg/dL (ref 65–99)
Potassium: 6.1 mmol/L — ABNORMAL HIGH (ref 3.5–5.1)
Sodium: 139 mmol/L (ref 135–145)

## 2015-07-20 LAB — CBC
HCT: 35.4 % (ref 35.0–47.0)
Hemoglobin: 11.3 g/dL — ABNORMAL LOW (ref 12.0–16.0)
MCH: 31.1 pg (ref 26.0–34.0)
MCHC: 31.9 g/dL — ABNORMAL LOW (ref 32.0–36.0)
MCV: 97.5 fL (ref 80.0–100.0)
Platelets: 187 10*3/uL (ref 150–440)
RBC: 3.63 MIL/uL — ABNORMAL LOW (ref 3.80–5.20)
RDW: 22.9 % — ABNORMAL HIGH (ref 11.5–14.5)
WBC: 3.7 10*3/uL (ref 3.6–11.0)

## 2015-07-20 LAB — POTASSIUM: Potassium: 5.5 mmol/L — ABNORMAL HIGH (ref 3.5–5.1)

## 2015-07-20 MED ORDER — SODIUM POLYSTYRENE SULFONATE 15 GM/60ML PO SUSP
15.0000 g | Freq: Once | ORAL | Status: AC
  Administered 2015-07-20: 15 g via ORAL
  Filled 2015-07-20: qty 60

## 2015-07-20 NOTE — Progress Notes (Signed)
Eevated K 5.5.  Spoke with Nephro. Will give kayexalate 15mg  once

## 2015-07-20 NOTE — BHH Group Notes (Signed)
[facility] LCSW Group Therapy   07/20/2015 1pm  Type of Therapy: Group Therapy   Participation Level: Did Not Attend. Patient invited to participate but declined.    Sabriel Borromeo F. Jaleena Viviani, MSW, LCSWA, LCAS   

## 2015-07-20 NOTE — Progress Notes (Signed)
D: Pt fixated on the fall that she stated she had and says " I fell and no one cares" Asking for oxygen, however, Pt has been assessed on this shift and previous shift and has no s/s of SOB. O2 is 100% and vitals are WNL. Pt asking for several snacks and becomes irritable when she is limited in number of snacks she can have. Pt is repeatedly asking for mucinex.  Disorganized and not oriented to situation. Denies SI/AVH A: Encouragement and support provided. Vitals assessed Q2 hours. Q15 minute checks maintained for safety. Medications given as prescribed. R: Pt remains safe on unit. Is redirectable with much prompting. Will continue to monitor.

## 2015-07-20 NOTE — BHH Suicide Risk Assessment (Signed)
[facility] INPATIENT:  Family/Significant Other Suicide Prevention Education  Suicide Prevention Education:  Education Completed; Armeda Chislom (daughter) [phone] has been identified by the patient as the family member/significant other with whom the patient will be residing, and identified as the person(s) who will aid the patient in the event of a mental health crisis (suicidal ideations/suicide attempt).  With written consent from the patient, the family member/significant other has been provided the following suicide prevention education, prior to the and/or following the discharge of the patient.  The suicide prevention education provided includes the following:  Suicide risk factors  Suicide prevention and interventions  National Suicide Hotline telephone number  [facility] assessment telephone number  [facility] Emergency Assistance [place] and/or Residential Mobile Crisis Unit telephone number  Request made of family/significant other to:  Remove weapons (e.g., guns, rifles, knives), all items previously/currently identified as safety concern.    Remove drugs/medications (over-the-counter, prescriptions, illicit drugs), all items previously/currently identified as a safety concern.  The family member/significant other verbalizes understanding of the suicide prevention education information provided.  The family member/significant other agrees to remove the items of safety concern listed above.  Keene Breath, MSW, LCSWA 07/20/2015, 3:42 PM

## 2015-07-20 NOTE — Progress Notes (Signed)
Patient took her medications and slept initially in bed, then in the chair. Patient appears to be sedated, chest and fall observed with respirations of 18. Patient was placed on 1:1 observation for Falls and Sedation. No falls reported. Will continue to monitor patient.

## 2015-07-20 NOTE — BHH Group Notes (Signed)
[facility] Group Notes:  (Nursing/MHT/Case Management/Adjunct)  Date:  07/20/2015  Time:  12:25 PM  Type of Therapy:  Psychoeducational Skills  Participation Level:  Did Not Attend  [place] Amy 07/20/2015, 12:25 PM

## 2015-07-20 NOTE — Progress Notes (Signed)
Recreation Therapy Notes  Date: 03.13.17 Time: 3:00 pm Location: Craft Room  Group Topic: Wellness  Goal Area(s) Addresses:  Patient will identify at least one item per dimension of health. Patient will examine areas they are deficient in.  Behavioral Response: Did not attend  Intervention: 6 Dimensions of Health  Activity: Patients were given a worksheet with the definitions of the 6 Dimensions of Health. Patients were given a pie chart with the 6 dimensions of healthy in each section and instructed to write 2-3 things they were currently doing in each section.  Education: LRT educated patients on ways they can increase each area.  Education Outcome: Patient did not attend group.  Clinical Observations/Feedback: Patient did not attend group.  Leonette Monarch, LRT/CTRS 07/20/2015 4:04 PM
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
## 2015-07-20 NOTE — [facility] Group Notes (Signed)
[facility] LCSW Aftercare Discharge Planning Group Note  07/20/2015 9:30 AM  Participation Quality: Did Not Attend. Patient invited to participate but declined.   Journie Howson F. [place] Goeken, MSW, LCSWA, LCAS   

## 2015-07-21 MED ORDER — ZIPRASIDONE HCL 20 MG PO CAPS
60.0000 mg | ORAL_CAPSULE | Freq: Two times a day (BID) | ORAL | Status: AC
  Administered 2015-07-21: 60 mg via ORAL
  Filled 2015-07-21: qty 3

## 2015-07-21 MED ORDER — LORAZEPAM 1 MG PO TABS
1.0000 mg | ORAL_TABLET | Freq: Every day | ORAL | Status: DC
  Administered 2015-07-21 – 2015-07-22 (×2): 1 mg via ORAL
  Filled 2015-07-21 (×2): qty 1

## 2015-07-21 MED ORDER — ZIPRASIDONE HCL 20 MG PO CAPS
120.0000 mg | ORAL_CAPSULE | Freq: Every day | ORAL | Status: DC
  Administered 2015-07-22: 120 mg via ORAL
  Filled 2015-07-21 (×2): qty 6

## 2015-07-21 MED ORDER — GUAIFENESIN ER 600 MG PO TB12
600.0000 mg | ORAL_TABLET | Freq: Two times a day (BID) | ORAL | Status: DC
  Administered 2015-07-21 (×2): 600 mg via ORAL
  Filled 2015-07-21 (×2): qty 1

## 2015-07-21 NOTE — Progress Notes (Signed)
[facility]  ROUNDING NOTE   Subjective:   Seen and examined on hemodialysis. Tolerated treatment well.    HEMODIALYSIS FLOWSHEET:  Blood Flow Rate (mL/min): 400 mL/min Arterial Pressure (mmHg): -150 mmHg Venous Pressure (mmHg): 130 mmHg Transmembrane Pressure (mmHg): 30 mmHg Ultrafiltration Rate (mL/min): 670 mL/min Dialysate Flow Rate (mL/min): 600 ml/min Conductivity: Machine : 14 Conductivity: Machine : 14 Dialysis Fluid Bolus: Normal Saline Bolus Amount (mL): 250 mL Intra-Hemodialysis Comments: 896 (TALKING TO MD)    Objective:  Vital signs in last 24 hours:  Temp:  [98.5 F (36.9 C)-98.6 F (37 C)] 98.5 F (36.9 C) (03/14 1200) Pulse Rate:  [69-85] 80 (03/14 1330) Resp:  [18-20] 20 (03/14 1330) BP: (121-147)/(72-92) 123/72 mmHg (03/14 1330) SpO2:  [93 %] 93 % (03/14 1210) Weight:  [87.091 kg (192 lb)] 87.091 kg (192 lb) (03/13 2044)  Weight change:  Filed Weights   07/16/15 1155 07/18/15 1430 07/20/15 2044  Weight: 84.6 kg (186 lb 8.2 oz) 90.4 kg (199 lb 4.7 oz) 87.091 kg (192 lb)    Intake/Output: I/O last 3 completed shifts: In: 600 [P.O.:600] Out: -    Intake/Output this shift:  Total I/O In: 240 [P.O.:240] Out: -   Physical Exam: General: NAD  Head: Normocephalic, atraumatic. Moist oral mucosal membranes  Eyes: Anicteric,    Neck: Supple, trachea midline  Lungs:  Clear to auscultation  Heart: Regular rate and rhythm  Abdomen:  Soft, nontender  Extremities: no peripheral edema.  Neurologic: Nonfocal, moving all four extremities  Skin: No lesions  Access: Right arm AVF    Basic Metabolic Panel:  Recent Labs Lab 07/15/15 1122 07/18/15 1122 07/20/15 1310 07/20/15 1619  NA 134* 130* 139  --   K 3.7 4.3 6.1* 5.5*  CL 97* 94* 104  --   CO2 27 27 27   --   GLUCOSE 104* 105* 80  --   BUN 50* 46* 47*  --   CREATININE 5.03* 4.25* 4.21*  --   CALCIUM 8.6* 8.6* 9.1  --   PHOS 5.1* 5.8*  --   --     Liver Function  Tests:  Recent Labs Lab 07/15/15 1122 07/18/15 1122  ALBUMIN 2.9* 2.8*   No results for input(s): LIPASE, AMYLASE in the last 168 hours. No results for input(s): AMMONIA in the last 168 hours.  CBC:  Recent Labs Lab 07/15/15 1123 07/18/15 1122 07/20/15 1310  WBC 6.3 4.8 3.7  HGB 10.0* 10.3* 11.3*  HCT 31.3* 32.0* 35.4  MCV 97.7 96.3 97.5  PLT 288 248 187    Cardiac Enzymes: No results for input(s): CKTOTAL, CKMB, CKMBINDEX, TROPONINI in the last 168 hours.  BNP: Invalid input(s): POCBNP  CBG:  Recent Labs Lab 07/14/15 2103  GLUCAP 59*    Microbiology: Results for orders placed or performed during the hospital encounter of 07/03/15  Blood Culture (routine x 2)     Status: None   Collection Time: 07/03/15  2:30 PM  Result Value Ref Range Status   Specimen Description BLOOD LEFT FOREARM  Final   Special Requests BOTTLES DRAWN AEROBIC AND ANAEROBIC 5CC  Final   Culture NO GROWTH 5 DAYS  Final   Report Status 07/08/2015 FINAL  Final  Culture, respiratory (NON-Expectorated)     Status: None   Collection Time: 07/03/15  6:20 PM  Result Value Ref Range Status   Specimen Description TRACHEAL ASPIRATE  Final   Special Requests Normal  Final   Gram Stain   Final  MODERATE WBC PRESENT,BOTH PMN AND MONONUCLEAR RARE SQUAMOUS EPITHELIAL CELLS PRESENT NO ORGANISMS SEEN Performed at [facility]    Culture   Final    NORMAL OROPHARYNGEAL FLORA Performed at [facility]    Report Status 07/06/2015 FINAL  Final  MRSA PCR Screening     Status: None   Collection Time: 07/03/15  7:14 PM  Result Value Ref Range Status   MRSA by PCR NEGATIVE NEGATIVE Final    Comment:        The GeneXpert MRSA Assay (FDA approved for NASAL specimens only), is one component of a comprehensive MRSA colonization surveillance program. It is not intended to diagnose MRSA infection nor to guide or monitor treatment for MRSA infections.   Blood Culture (routine x 2)      Status: None   Collection Time: 07/03/15  8:00 PM  Result Value Ref Range Status   Specimen Description BLOOD RIGHT HAND  Final   Special Requests BOTTLES DRAWN AEROBIC ONLY [place]  Final   Culture NO GROWTH 5 DAYS  Final   Report Status 07/08/2015 FINAL  Final  Respiratory virus panel     Status: Abnormal   Collection Time: 07/05/15  5:57 PM  Result Value Ref Range Status   Respiratory Syncytial Virus A Negative Negative Final   Respiratory Syncytial Virus B Negative Negative Final   Influenza A Negative Negative Final   Influenza B Negative Negative Final   Parainfluenza 1 Negative Negative Final   Parainfluenza 2 Negative Negative Final   Parainfluenza 3 Negative Negative Final   Metapneumovirus Positive (A) Negative Final   Rhinovirus Negative Negative Final   Adenovirus Negative Negative Final    Comment: (NOTE) Performed At: [facility] [address] [place], [place] [id number] Lindon Romp MD [id number]     Coagulation Studies: No results for input(s): LABPROT, INR in the last 72 hours.  Urinalysis: No results for input(s): COLORURINE, LABSPEC, PHURINE, GLUCOSEU, HGBUR, BILIRUBINUR, KETONESUR, PROTEINUR, UROBILINOGEN, NITRITE, LEUKOCYTESUR in the last 72 hours.  Invalid input(s): APPERANCEUR    Imaging: Ct Head Wo Contrast  07/20/2015  CLINICAL DATA:  Fall face first onto for yesterday. Altered mental status. EXAM: CT HEAD WITHOUT CONTRAST TECHNIQUE: Contiguous axial images were obtained from the base of the skull through the vertex without intravenous contrast. COMPARISON:  01/27/2015 FINDINGS: Ventricles, cisterns and other CSF spaces are within normal. There is no mass, mass effect, shift of midline structures or acute hemorrhage. There is no evidence of acute infarction. Subtle chronic ischemic microvascular disease. Minimal opacification over the posterior aspect of the left maxillary sinus unchanged. Non fusion of the midline posterior arch of C1.  IMPRESSION: No acute intracranial findings. Subtle chronic ischemic microvascular disease. Electronically Signed   By: Marin Olp M.D.   On: 07/20/2015 18:09     Medications:     . b complex-vitamin c-folic acid  1 tablet Oral QHS  . cinacalcet  60 mg Oral Q breakfast  . divalproex  2,000 mg Oral QHS  . docusate sodium  200 mg Oral BID  . LORazepam  1 mg Oral QHS  . metoprolol tartrate  25 mg Oral BID  . mometasone-formoterol  2 puff Inhalation BID  . nicotine  21 mg Transdermal Q0600  . pantoprazole  40 mg Oral Daily  . senna  2 tablet Oral QHS  . sevelamer carbonate  1,600 mg Oral TID WC  . simvastatin  10 mg Oral q1800  . ziprasidone  80 mg Oral  BID WC   acetaminophen, albuterol, magnesium hydroxide, ondansetron  Assessment/ Plan:  Jennifer Lawson is a 55 y.o. black female with ESRD on HD since 1/16 followed by [facility], COPD, secondary hyperparathyroidism, anemia of CKD, bipolar affective disorder  1. ESRD on HD TTS: Patient seen during dialysis Tolerating well   2. Anemia of CKD: hemoglobin 11.3 - holding epo. Micera as outpatient.   3. Secondary hyperparathyroidism of renal origin: phosphorus elevated at 5.8 - not at goal.  - sevelamer for binding.  - cinacalcet  4. Hyperkalemia - low K dialysis - expected to improve with HD    LOS: 8 Kathee Tumlin 3/14/20172:05 PM

## 2015-07-21 NOTE — Progress Notes (Signed)
Patient with sad affect, cooperative behavior with meals, meds and plan of care. No distress, no complaint. Remains on one to one slightly weak, tired and increased risk for falls. Patient to dialysis today with one on one of staff. No SI/HI at this time. Dialysis called and reported they removed 1.5 liters fluid. Safety maintained.

## 2015-07-21 NOTE — Progress Notes (Signed)
D: Patient has been demanding and labile this evening. She has been irritable and constantly coming up to the nurses station. She has had multiple complaints one being coughing up phlegm. MD was notified. States pain at a 10. This appears to be a constant 10. Denies SI/HI/AVH. She also complaining of trouble breathing and states that her breathing treatments are suppose to be q4 hours which are not in the orders.  A: Medication was given with education. Encouragement was provided. PRN Tylenol and Proventil was given. Musinex was ordered by MD and given this evening.  R: Patient was compliant with medication. Patient continues on 1:1 safety sitter to prevent falls.

## 2015-07-21 NOTE — Progress Notes (Signed)
Recreation Therapy Notes  Date: 03.14.17 Time: 3:00 pm Location: Craft Room  Group Topic: Self-expression  Goal Area(s) Addresses:  Patient will be able to identify a color that represents each emotion. Patient will verbalize benefit of using art as a means of self-expression. Patient will verbalize one positive emotion experienced while participating in group.  Behavioral Response: Did not attend  Intervention: The Colors Within Me  Activity: Patients were given a blank face worksheet and instructed to pick a color for each emotion they were feeling and to show how much of that emotion they were feeling on the face.  Education: LRT educated patients on other forms of self-expression.  Education Outcome: Patient did not attend group.  Clinical Observations/Feedback: Patient did not attend group.  Leonette Monarch, LRT/CTRS 07/21/2015 4:13 PM

## 2015-07-21 NOTE — [facility] Group Notes (Signed)
[facility] Group Notes:  (Nursing/MHT/Case Management/Adjunct)  Date:  07/21/2015  Time:  2:57 PM  Type of Therapy:  Psychoeducational Skills  Participation Level:  Did Not Attend   Adela Lank [facility] 07/21/2015, 2:57 PM

## 2015-07-21 NOTE — Tx Team (Signed)
Interdisciplinary Treatment Plan Update (Adult)  Date:  07/21/2015 Time Reviewed:  3:50 PM  Progress in Treatment: Attending groups: No.  Participating in groups:  No. Taking medication as prescribed:  Yes. Tolerating medication:  Yes. Family/Significant othe contact made:  Yes, individual(s) contacted:  patient's daughter Patient understands diagnosis:  No.  Discussing patient identified problems/goals with staff:  Yes. Medical problems stabilized or resolved:  Yes. Denies suicidal/homicidal ideation: Yes. Issues/concerns per patient self-inventory:  Yes. Other:  New problem(s) identified: No, Describe:  none reported  Discharge Plan or Barriers: Patient will stabilize on medications and discharge home. Patient will need hospital follow up schedule for outpatient services at discharge.  Reason for Continuation of Hospitalization: Aggression Mania Medication stabilization  Comments:   Estimated length of stay: up to 7 days expected to discharge Thursday 07/23/15  New goal(s):  Review of initial/current patient goals per problem list:   1.  Goal(s):participate in aftercare plan  Met:  Yes  Target date:by discharge  As evidenced RS:WNIOEVO will participate in aftercare plan AEB aftercare provider and housing plan identified at discharge 07/14/15: patient will discharge home and will follow up with Dr. Wonda Amis at [facility] in [place] at discharge. Goal met.   2.  Goal (s):decrease psychosis  Met:  No  Target date:by discharge  As evidenced by: demonstrates decreased signs of pschosis 07/14/15: Patient was aggressive with staff in ER and with EMS and stating she does not want dialysis and needs med stabilization as she stopped taking meds that were affecting patient kidney who is a dialysis patient 07/16/15: Patient remains disorganized and needs continued hospitalization. 07/21/15: Patient remains disorganized and will continue to monitor. Patient will get dialysis  today and hopefully see improvement or if patient may need continued medication adjustment once on a regular routine for dialysis as her scheduled days for dialysis have changed.   2.  Goal (s): Decrease mania   Met:  No  Target date:by discharge  As evidenced by: patient demonstrates decreased symptoms of mania 07/14/15: Patient is admitted for psychosis and aggressive behavior and will need med stabilization. 07/16/15: Patient still disorganized and wanting to discharge and states that she does not have any psychiatric needs.  07/21/15: Patient still disorganized and has been sedated so no meds last night that were sedative were given.   Attendees: Physician:  Merlyn Albert, MD 1/25/201710:20 AM  Nursing:   Carolynn Sayers, RN 1/25/201710:20 AM  Other:  Carmell Austria, LCSWA 1/25/201710:20 AM  Other:   1/25/201710:20 AM  Other:   1/25/201710:20 AM  Other:  1/25/201710:20 AM  Other:  1/25/201710:20 AM  Other:  1/25/201710:20 AM  Other:  1/25/201710:20 AM  Other:  1/25/201710:20 AM  Other:  1/25/201710:20 AM  Other:   1/25/201710:20 AM    Scribe for Treatment Team:   Keene Breath, MSW, [place]   07/21/2015, 3:50 PM  [phone]

## 2015-07-21 NOTE — [facility] Group Notes (Signed)
[facility] LCSW Group Therapy   07/21/2015 1pm  Type of Therapy: Group Therapy   Participation Level: Did Not Attend. Patient invited to participate but declined.    Alphonse Guild. Zanetta Dehaan, MSW, LCSWA, LCAS
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
## 2015-07-21 NOTE — Plan of Care (Signed)
Problem: Alteration in thought process Goal: LTG-Patient is able to perceive the environment accurately Outcome: Progressing When in need of something patient is able to locate the nurses station.

## 2015-07-22 MED ORDER — ZIPRASIDONE HCL 60 MG PO CAPS: 120.0000 mg | ORAL_CAPSULE | Freq: Every day | ORAL | Status: DC

## 2015-07-22 MED ORDER — NEPHRO-VITE 0.8 MG PO TABS: 1.0000 | ORAL_TABLET | Freq: Every day | ORAL | Status: DC

## 2015-07-22 MED ORDER — DIVALPROEX SODIUM ER 500 MG PO TB24: 2000.0000 mg | ORAL_TABLET | Freq: Every day | ORAL | Status: DC

## 2015-07-22 MED ORDER — LORAZEPAM 1 MG PO TABS: 1.0000 mg | ORAL_TABLET | Freq: Every day | ORAL | Status: DC

## 2015-07-22 NOTE — [facility] Group Notes (Signed)
[facility] LCSW Aftercare Discharge Planning Group Note   07/22/2015 9:15 AM  ?  Participation Quality: Alert, Appropriate and Oriented   Mood/Affect: Depressed and Flat   Depression Rating: 5  Anxiety Rating: 5  Thoughts of Suicide: Pt denies SI/HI   Will you contract for safety? Yes   Current AVH: Pt denies   Plan for Discharge/Comments: Pt attended discharge planning group and actively participated in group. CSW provided pt with today's workbook. Pt shared she looked forward to being home with her oxygen tank and with her nebulizer.  Pt shared she has fifteen grandchildren and her goal is to see them again soon after discharge.  Transportation Means: Pt reports access to transportation   Supports: Pt lists her family as primary supports    Alphonse Guild. Mirah Nevins, MSW, LCSWA, LCAS

## 2015-07-22 NOTE — Progress Notes (Signed)
Recreation Therapy Notes  Date: 03.15.17 Time: 3:00 pm Location: Craft Room  Group Topic: Self-esteem  Goal Area(s) Addresses:  Patient will write at least one positive trait. Patient will verbalize benefit of having a healthy self-esteem.  Behavioral Response: Did not attend  Intervention: I Am  Activity: Patients were given a worksheet with the letter I on it and instructed to write as many positive traits inside the letter.  Education: LRT educated patients on ways they can increase their self-esteem.  Education Outcome: Patient did not attend group.  Clinical Observations/Feedback: Patient did not attend group.  Leonette Monarch, LRT/CTRS 07/22/2015 4:31 PM
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
## 2015-07-22 NOTE — Progress Notes (Signed)
1:1 Note Patient remains safe with safety sitter within arms reach. Pt complained to this writer, "I can't breathe." Vitals taken and noted in flowsheet. 92% RA. Will continue to monitor for needs/safety.

## 2015-07-22 NOTE — BHH Group Notes (Signed)
[facility] Group Notes:  (Nursing/MHT/Case Management/Adjunct)  Date:  07/22/2015  Time:  3:11 PM  Type of Therapy:  Psychoeducational Skills  Participation Level:  Active  Participation Quality:  Appropriate, Attentive and Sharing  Affect:  Appropriate  Cognitive:  Appropriate  Insight:  Appropriate  Engagement in Group:  Engaged  Modes of Intervention:  Discussion, Education and Support  Summary of Progress/Problems:  Jennifer Lawson 07/22/2015, 3:11 PM

## 2015-07-22 NOTE — BHH Group Notes (Signed)
[facility] LCSW Group Therapy   07/22/2015 1pm  Type of Therapy: Group Therapy   Participation Level: Did Not Attend. Patient invited to participate but declined.    Nakeisha Greenhouse F. Rakan Soffer, MSW, LCSWA, LCAS   

## 2015-07-22 NOTE — Progress Notes (Signed)
D: Pt denies SI/HI/AVH. Pt is pleasant and cooperative, affect is flat and sad, Pt appeared tired and restless, pt stated " I feel tired after dialysis" Pt is less anxious and she is interacting with peers and staff appropriately.  A: Pt was offered support and encouragement. Pt was given scheduled medications. Pt was encouraged to attend groups. Q 15 minute checks were done for safety.  R:Pt did not attend evening group. Pt is complaint with medication. Pt receptive to treatment and safety maintained on unit.

## 2015-07-22 NOTE — Progress Notes (Signed)
D: Brinlynn has denied SI/HI/AVH. No psych cx, but she did say this a.m that, "I can't breathe." Vitals were WNL (see flowsheet. MD aware. Pt made aware that if she does OK today she may discharge tomorrow. Pt ambulating on unit well with walker. No acute distress noted. A: Meds given as ordered. Q15 safety checks maintained. Support/encouragement offered. R: Pt remains free from harm and continues with treatment. Will continue to monitor for needs/safety.

## 2015-07-22 NOTE — BHH Group Notes (Signed)
[facility] Group Notes:  (Nursing/MHT/Case Management/Adjunct)  Date:  07/22/2015  Time:  10:25 PM  Type of Therapy:  Group Therapy  Participation Level:  Active  Participation Quality:  Appropriate  Affect:  Appropriate  Cognitive:  Appropriate  Insight:  Appropriate  Engagement in Group:  Engaged  Modes of Intervention:  n/a  Summary of Progress/Problems:  Marylynn Pearson 07/22/2015, 10:25 PM

## 2015-07-22 NOTE — Plan of Care (Signed)
Problem: Alteration in mood Goal: LTG-Patient reports reduction in suicidal thoughts (Patient reports reduction in suicidal thoughts and is able to verbalize a safety plan for whenever patient is feeling suicidal)  Outcome: Progressing Patient denies SI/HI.      

## 2015-07-23 DIAGNOSIS — I679 Cerebrovascular disease, unspecified: Secondary | ICD-10-CM

## 2015-07-23 MED ORDER — NEPHRO-VITE 0.8 MG PO TABS: 1.0000 | ORAL_TABLET | Freq: Every day | ORAL | Status: AC

## 2015-07-23 MED ORDER — SIMVASTATIN 10 MG PO TABS: 10.0000 mg | ORAL_TABLET | Freq: Every day | ORAL | Status: AC

## 2015-07-23 MED ORDER — CINACALCET HCL 60 MG PO TABS: 60.0000 mg | ORAL_TABLET | Freq: Every day | ORAL | Status: AC

## 2015-07-23 MED ORDER — PANTOPRAZOLE SODIUM 40 MG PO TBEC: 40.0000 mg | DELAYED_RELEASE_TABLET | Freq: Every day | ORAL | Status: DC

## 2015-07-23 MED ORDER — METOPROLOL TARTRATE 25 MG PO TABS: 25.0000 mg | ORAL_TABLET | Freq: Two times a day (BID) | ORAL | Status: DC

## 2015-07-23 MED ORDER — DIPHENHYDRAMINE HCL 25 MG PO CAPS
25.0000 mg | ORAL_CAPSULE | Freq: Once | ORAL | Status: AC
Start: 2015-07-23 — End: 2015-07-23
  Administered 2015-07-23: 25 mg via ORAL
  Filled 2015-07-23: qty 1

## 2015-07-23 MED ORDER — SEVELAMER CARBONATE 800 MG PO TABS: 1600.0000 mg | ORAL_TABLET | Freq: Three times a day (TID) | ORAL | Status: AC

## 2015-07-23 NOTE — Progress Notes (Signed)
Recreation Therapy Notes  Date: 03.16.17 Time: 3:00 pm Location: Craft Room  Group Topic: Leisure Education  Goal Area(s) Addresses:  Patient will identify activities for each letter of the alphabet. Patient will verbalize ability to integrate positive leisure into life post d/c. Patient will verbalize ability to use leisure as a Technical sales engineer.  Behavioral Response: Did not attend  Intervention: Leisure Alphabet  Activity: Patients were given a Leisure Air traffic controller and instructed to list a healthy leisure activity for each letter of the alphabet.  Education: LRT educated patients on what is needed to participate in leisure.  Education Outcome: Patient did not attend group.  Clinical Observations/Feedback: Patient did not attend group.  Leonette Monarch, LRT/CTRS 07/23/2015 4:14 PM

## 2015-07-23 NOTE — Progress Notes (Signed)
D/C instructions/transition record/suicide risk assessmenr/meds/follow-up appointments reviewed, pt verbalized understanding, pt's belongings returned to pt, denies SI/HI/AVH.

## 2015-07-23 NOTE — BHH Group Notes (Signed)
[facility] LCSW Group Therapy   07/23/2015 11am   Type of Therapy: Group Therapy   Participation Level: Minimal  Participation Quality: Attentive   Affect: Appropriate   Cognitive: Alert   Insight: Developing/Improving and Engaged   Engagement in Therapy: Developing/Improving and Engaged   Modes of Intervention: Clarification, Confrontation, Discussion, Education, Exploration, Limit-setting, Orientation, Problem-solving, Rapport Building, Art therapist, Socialization and Support   Summary of Progress/Problems: The topic for group was balance in life. Today's group focused on defining balance in one's own words, identifying things that can knock one off balance, and exploring healthy ways to maintain balance in life. Group members were asked to provide an example of a time when they felt off balance, describe how they handled that situation, and process healthier ways to regain balance in the future. Group members were asked to share the most important tool for maintaining balance that they learned while at [facility] and how they plan to apply this method after discharge. Pt did not share at length and left group after fifteen minutes to use the restroom.  Pt did not return.

## 2015-07-23 NOTE — Plan of Care (Signed)
Problem: Alteration in thought process Goal: LTG-Patient behavior demonstrates decreased signs psychosis (Patient behavior demonstrates decreased signs of psychosis to the point the patient is safe to return home and continue treatment in an outpatient setting.)  Outcome: Progressing Patient has no s/s of psychosis.

## 2015-07-23 NOTE — Progress Notes (Signed)
D: Pt denies SI/HI/AVH. Pt is pleasant and cooperative, affect is flat but brightens upon approach, no acute distress noted. Pt stated "she is ready to go home" she appears less anxious and she is interacting with peers and staff appropriately.  A: Pt was offered support and encouragement. Pt was given scheduled medications. Pt was encouraged to attend groups. Q 15 minute checks were done for safety.  R:Pt attends groups and interacts well with peers and staff. Pt is taking medication. Pt receptive to treatment and safety maintained on unit.

## 2015-07-23 NOTE — BHH Suicide Risk Assessment (Addendum)
[facility] Discharge Suicide Risk Assessment   Principal Problem: Bipolar affective disorder, current episode manic with psychotic symptoms [facility]) Discharge Diagnoses:  Patient Active Problem List   Diagnosis Date Noted  . Cerebrovascular disease [I67.9] 07/23/2015  . Dyslipidemia [E78.5] 07/14/2015  . Bipolar affective disorder, current episode manic with psychotic symptoms ([place]) [F31.2] 07/13/2015  . Anemia of renal disease [D63.1] 01/26/2015  . CKD (chronic kidney disease) stage V requiring chronic dialysis ([place]) [N18.6, Z99.2] 05/13/2014  . COPD (chronic obstructive pulmonary disease) ([place]) [J44.9] 09/26/2013  . Hyperparathyroidism, primary ([place]) [E21.0] 02/20/2013  . Tobacco use disorder [F17.200] 11/13/2012  . HTN (hypertension) [I10] 02/20/2012     Psychiatric Specialty Exam: ROS                                                         Mental Status Per Nursing Assessment::   On Admission:     Demographic Factors:  Caucasian  Loss Factors: Decline in physical health  Historical Factors: Impulsivity  Risk Reduction Factors:   Sense of responsibility to family, Living with another person, especially a relative and Positive social support  Continued Clinical Symptoms:  Previous Psychiatric Diagnoses and Treatments Medical Diagnoses and Treatments/Surgeries  Cognitive Features That Contribute To Risk:  Closed-mindedness    Suicide Risk:  Minimal: No identifiable suicidal ideation.  Patients presenting with no risk factors but with morbid ruminations; may be classified as minimal risk based on the severity of the depressive symptoms  Follow-up Information    Follow up with [facility]. Go on 07/24/2015.   Why:  For follow-up care appt on Friday 07/24/15 at 4:00pm    Contact information:   [place], [place] Fax [phone]       Follow up with [facility].   Why:   Please call to schedule your follow up appointment for dialysis treatment upon discharge from the hospital.   Contact information:   [place], [place] [address] Phone: [phone] Fax: [phone]       Follow up with [facility] .   Why:  Patient is referred to ACT services and will need to follow up with [facility] at this contact number upon discharge. Patient will continue being seen at her current provider until ACT services are in place.   Contact information:   [id number] [place], [place] [address] Ph [phone] Fax [phone]       Follow up with [facility] On 07/31/2015.   Specialty:  Internal Medicine   Why:  Friday March 24 at 10:30 am   Contact information:   [address] [place] [address] [phone]         Hildred Priest, MD 07/23/2015, 2:23 PM

## 2015-07-23 NOTE — Progress Notes (Signed)
Pre-hd tx 

## 2015-07-23 NOTE — BHH Group Notes (Signed)
[facility] Group Notes:  (Nursing/MHT/Case Management/Adjunct)  Date:  07/23/2015  Time:  9:25 AM  Type of Therapy:  Community Meeting   Participation Level:  Did Not Attend  Jennifer Lawson 07/23/2015, 9:25 AM
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
## 2015-07-23 NOTE — Tx Team (Signed)
Interdisciplinary Treatment Plan Update (Adult)  Date:  07/23/2015 Time Reviewed:  11:43 AM  Progress in Treatment: Attending groups: Yes.  Participating in groups:  Yes. Taking medication as prescribed:  Yes. Tolerating medication:  Yes. Family/Significant othe contact made:  Yes, individual(s) contacted:  patient's daughter Patient understands diagnosis:  Yes.  Discussing patient identified problems/goals with staff:  Yes. Medical problems stabilized or resolved:  Yes. Denies suicidal/homicidal ideation: Yes. Issues/concerns per patient self-inventory:  Yes. Other:  New problem(s) identified: No, Describe:  none reported  Discharge Plan or Barriers: Patient will stabilize on medications and discharge home. Patient will need hospital follow up schedule for outpatient mental health services and is referred for ACT services as well as home health at discharge.  Reason for Continuation of Hospitalization: Aggression Mania Medication stabilization  Comments:   Estimated length of stay: 0 days will discharge today Thursday 07/23/15  New goal(s):  Review of initial/current patient goals per problem list:   1.  Goal(s):participate in aftercare plan  Met:  Yes  Target date:by discharge  As evidenced YQ:MGNOIBB will participate in aftercare plan AEB aftercare provider and housing plan identified at discharge 07/14/15: patient will discharge home and will follow up with Dr. Wonda Amis at [facility] in [place] at discharge. Goal met.   2.  Goal (s):decrease psychosis  Met:  Yes  Target date:by discharge  As evidenced by: demonstrates decreased signs of pschosis 07/14/15: Patient was aggressive with staff in ER and with EMS and stating she does not want dialysis and needs med stabilization as she stopped taking meds that were affecting patient kidney who is a dialysis patient 07/16/15: Patient remains disorganized and needs continued hospitalization. 07/21/15: Patient remains  disorganized and will continue to monitor. Patient will get dialysis today and hopefully see improvement or if patient may need continued medication adjustment once on a regular routine for dialysis as her scheduled days for dialysis have changed.   07/23/15: Patient stable for discharge per MD. Goal met.   2.  Goal (s): Decrease mania   Met:  Yes  Target date:by discharge  As evidenced by: patient demonstrates decreased symptoms of mania 07/14/15: Patient is admitted for psychosis and aggressive behavior and will need med stabilization. 07/16/15: Patient still disorganized and wanting to discharge and states that she does not have any psychiatric needs.  07/21/15: Patient still disorganized and has been sedated so no meds last night that were sedative were given. 07/23/15: Patient stable for discharge per MD. Goal met.    Attendees: Physician:  Merlyn Albert, MD 1/25/201710:20 AM  Nursing:   Tyler Pita, RN 1/25/201710:20 AM  Other:  Carmell Austria, LCSWA 1/25/201710:20 AM  Other:   1/25/201710:20 AM  Other:   1/25/201710:20 AM  Other:  1/25/201710:20 AM  Other:  1/25/201710:20 AM  Other:  1/25/201710:20 AM  Other:  1/25/201710:20 AM  Other:  1/25/201710:20 AM  Other:  1/25/201710:20 AM  Other:   1/25/201710:20 AM    Scribe for Treatment Team:   Keene Breath, MSW, [place]   07/23/2015, 11:43 AM  [phone]

## 2015-07-23 NOTE — [facility] Group Notes (Signed)
[facility] Group Notes:  (Nursing/MHT/Case Management/Adjunct)  Date:  07/23/2015  Time:  2:09 PM  Type of Therapy:  Group Therapy  Participation Level:  Did Not Attend  Matsuko Kretz De'Chelle Johara Lodwick 07/23/2015, 2:09 PM
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
## 2015-07-23 NOTE — Progress Notes (Signed)
Hemodialysis start 

## 2015-07-23 NOTE — Progress Notes (Signed)
Patient denies suicidal or homicidal ideations.Appropriate with staff & peers.Compliant with medications.Patient will be discharged after dialysis & PT evaluations for home health.

## 2015-07-23 NOTE — Progress Notes (Signed)
  [facility] Adult Case Management Discharge Plan :  Will you be returning to the same living situation after discharge:  Yes,  home with daughter At discharge, do you have transportation home?: Yes,  patient's daughter will pick up Do you have the ability to pay for your medications: Yes,  patient has Medicaid and Medicare  Release of information consent forms completed and in the chart;  Patient's signature needed at discharge.  Patient to Follow up at: Follow-up Information    Follow up with [facility]. Go on 07/24/2015.   Why:  For follow-up care appt on Friday 07/24/15 at 4:00pm    Contact information:   [place], [place] Fax [phone]       Follow up with [facility].   Why:  Please call to schedule your follow up appointment for dialysis treatment upon discharge from the hospital.   Contact information:   [place], [place] [address] Phone: [phone] Fax: [phone]       Follow up with [facility] .   Why:  Patient is referred to ACT services and will need to follow up with [facility] at this contact number upon discharge. Patient will continue being seen at her current provider until ACT services are in place.   Contact information:   [id number] [place] [address], [place] [address] Ph [phone] Fax [phone]       Follow up with [facility] On 07/31/2015.   Specialty:  Internal Medicine   Why:  Friday March 24 at 10:30 am   Contact information:   [place] [address] [address] [phone]       Next level of care provider has access to [place] and Suicide Prevention discussed: Yes,  SPE discussed with patient and Anarely Sarchet (daughter) [phone]   Have you used any form of tobacco in the last 30 days? (Cigarettes, Smokeless Tobacco, Cigars, and/or Pipes): Yes  Has patient been referred to the Quitline?: Patient refused  referral  Patient has been referred for addiction treatment: N/A  Keene Breath, MSW, LCSWA 07/23/2015, 11:46 AM  [phone]

## 2015-08-06 ENCOUNTER — Emergency Department (HOSPITAL_COMMUNITY): Payer: 59

## 2015-08-06 ENCOUNTER — Encounter (HOSPITAL_COMMUNITY): Payer: Self-pay | Admitting: Emergency Medicine

## 2015-08-06 ENCOUNTER — Observation Stay (HOSPITAL_COMMUNITY)
Admission: EM | Admit: 2015-08-06 | Discharge: 2015-08-09 | Disposition: A | Discharge status: Home / Self Care | Payer: 59 | Attending: Internal Medicine | Admitting: Internal Medicine

## 2015-08-06 DIAGNOSIS — E875 Hyperkalemia: Secondary | ICD-10-CM | POA: Diagnosis not present

## 2015-08-06 DIAGNOSIS — Z7951 Long term (current) use of inhaled steroids: Secondary | ICD-10-CM | POA: Diagnosis not present

## 2015-08-06 DIAGNOSIS — E119 Type 2 diabetes mellitus without complications: Secondary | ICD-10-CM | POA: Insufficient documentation

## 2015-08-06 DIAGNOSIS — N186 End stage renal disease: Secondary | ICD-10-CM | POA: Diagnosis not present

## 2015-08-06 DIAGNOSIS — E162 Hypoglycemia, unspecified: Secondary | ICD-10-CM

## 2015-08-06 DIAGNOSIS — F99 Mental disorder, not otherwise specified: Secondary | ICD-10-CM | POA: Diagnosis not present

## 2015-08-06 DIAGNOSIS — E11649 Type 2 diabetes mellitus with hypoglycemia without coma: Secondary | ICD-10-CM | POA: Insufficient documentation

## 2015-08-06 DIAGNOSIS — N189 Chronic kidney disease, unspecified: Secondary | ICD-10-CM | POA: Diagnosis not present

## 2015-08-06 DIAGNOSIS — F329 Major depressive disorder, single episode, unspecified: Secondary | ICD-10-CM | POA: Diagnosis not present

## 2015-08-06 DIAGNOSIS — J449 Chronic obstructive pulmonary disease, unspecified: Secondary | ICD-10-CM | POA: Insufficient documentation

## 2015-08-06 DIAGNOSIS — R5381 Other malaise: Secondary | ICD-10-CM | POA: Insufficient documentation

## 2015-08-06 DIAGNOSIS — R4182 Altered mental status, unspecified: Principal | ICD-10-CM | POA: Insufficient documentation

## 2015-08-06 DIAGNOSIS — R011 Cardiac murmur, unspecified: Secondary | ICD-10-CM | POA: Diagnosis not present

## 2015-08-06 DIAGNOSIS — K219 Gastro-esophageal reflux disease without esophagitis: Secondary | ICD-10-CM | POA: Diagnosis not present

## 2015-08-06 DIAGNOSIS — G9340 Encephalopathy, unspecified: Secondary | ICD-10-CM | POA: Diagnosis not present

## 2015-08-06 DIAGNOSIS — Z9981 Dependence on supplemental oxygen: Secondary | ICD-10-CM | POA: Diagnosis not present

## 2015-08-06 DIAGNOSIS — W19XXXA Unspecified fall, initial encounter: Secondary | ICD-10-CM

## 2015-08-06 DIAGNOSIS — F17210 Nicotine dependence, cigarettes, uncomplicated: Secondary | ICD-10-CM | POA: Insufficient documentation

## 2015-08-06 DIAGNOSIS — Z79899 Other long term (current) drug therapy: Secondary | ICD-10-CM | POA: Diagnosis not present

## 2015-08-06 DIAGNOSIS — J45909 Unspecified asthma, uncomplicated: Secondary | ICD-10-CM

## 2015-08-06 DIAGNOSIS — I129 Hypertensive chronic kidney disease with stage 1 through stage 4 chronic kidney disease, or unspecified chronic kidney disease: Secondary | ICD-10-CM | POA: Diagnosis not present

## 2015-08-06 DIAGNOSIS — Z992 Dependence on renal dialysis: Secondary | ICD-10-CM

## 2015-08-06 LAB — I-STAT VENOUS BLOOD GAS, ED
Acid-base deficit: 7 mmol/L — ABNORMAL HIGH (ref 0.0–2.0)
Bicarbonate: 22.1 mEq/L (ref 20.0–24.0)
O2 Saturation: 98 %
TCO2: 24 mmol/L (ref 0–100)
pCO2, Ven: 58.7 mmHg — ABNORMAL HIGH (ref 45.0–50.0)
pH, Ven: 7.183 — CL (ref 7.250–7.300)
pO2, Ven: 125 mmHg — ABNORMAL HIGH (ref 31.0–45.0)

## 2015-08-06 LAB — CBG MONITORING, ED
Glucose-Capillary: 112 mg/dL — ABNORMAL HIGH (ref 65–99)
Glucose-Capillary: 51 mg/dL — ABNORMAL LOW (ref 65–99)
Glucose-Capillary: 109 mg/dL — ABNORMAL HIGH (ref 65–99)
Glucose-Capillary: 58 mg/dL — ABNORMAL LOW (ref 65–99)
Glucose-Capillary: 83 mg/dL (ref 65–99)
Glucose-Capillary: 99 mg/dL (ref 65–99)
Glucose-Capillary: 69 mg/dL (ref 65–99)
Glucose-Capillary: 81 mg/dL (ref 65–99)

## 2015-08-06 LAB — BASIC METABOLIC PANEL
Anion gap: 9 (ref 5–15)
BUN: 77 mg/dL — ABNORMAL HIGH (ref 6–20)
CO2: 21 mmol/L — ABNORMAL LOW (ref 22–32)
Calcium: 9.4 mg/dL (ref 8.9–10.3)
Chloride: 110 mmol/L (ref 101–111)
Creatinine, Ser: 5.84 mg/dL — ABNORMAL HIGH (ref 0.44–1.00)
GFR calc Af Amer: 9 mL/min — ABNORMAL LOW (ref >60)
GFR calc non Af Amer: 7 mL/min — ABNORMAL LOW (ref >60)
Glucose, Bld: 84 mg/dL (ref 65–99)
Potassium: 6.1 mmol/L (ref 3.5–5.1)
Sodium: 140 mmol/L (ref 135–145)

## 2015-08-06 LAB — COMPREHENSIVE METABOLIC PANEL
ALT: 9 U/L — ABNORMAL LOW (ref 14–54)
AST: 24 U/L (ref 15–41)
Albumin: 3.4 g/dL — ABNORMAL LOW (ref 3.5–5.0)
Alkaline Phosphatase: 113 U/L (ref 38–126)
Anion gap: 15 (ref 5–15)
BUN: 77 mg/dL — ABNORMAL HIGH (ref 6–20)
CO2: 14 mmol/L — ABNORMAL LOW (ref 22–32)
Calcium: 9.8 mg/dL (ref 8.9–10.3)
Chloride: 112 mmol/L — ABNORMAL HIGH (ref 101–111)
Creatinine, Ser: 5.79 mg/dL — ABNORMAL HIGH (ref 0.44–1.00)
GFR calc Af Amer: 9 mL/min — ABNORMAL LOW (ref >60)
GFR calc non Af Amer: 7 mL/min — ABNORMAL LOW (ref >60)
Glucose, Bld: 50 mg/dL — ABNORMAL LOW (ref 65–99)
Potassium: 7.3 mmol/L (ref 3.5–5.1)
Sodium: 141 mmol/L (ref 135–145)
Total Bilirubin: 0.7 mg/dL (ref 0.3–1.2)
Total Protein: 7.4 g/dL (ref 6.5–8.1)

## 2015-08-06 LAB — GLUCOSE, CAPILLARY: Glucose-Capillary: 72 mg/dL (ref 65–99)

## 2015-08-06 LAB — CBC
HCT: 39.6 % (ref 36.0–46.0)
Hemoglobin: 11.6 g/dL — ABNORMAL LOW (ref 12.0–15.0)
MCH: 30.4 pg (ref 26.0–34.0)
MCHC: 29.3 g/dL — ABNORMAL LOW (ref 30.0–36.0)
MCV: 103.9 fL — ABNORMAL HIGH (ref 78.0–100.0)
Platelets: ADEQUATE 10*3/uL (ref 150–400)
RBC: 3.81 MIL/uL — ABNORMAL LOW (ref 3.87–5.11)
RDW: 20.4 % — ABNORMAL HIGH (ref 11.5–15.5)
WBC: 4.2 10*3/uL (ref 4.0–10.5)

## 2015-08-06 LAB — I-STAT CG4 LACTIC ACID, ED
Lactic Acid, Venous: 1.66 mmol/L (ref 0.5–2.0)
Lactic Acid, Venous: 1.06 mmol/L (ref 0.5–2.0)

## 2015-08-06 LAB — I-STAT TROPONIN, ED: Troponin i, poc: 0.03 ng/mL (ref 0.00–0.08)

## 2015-08-06 LAB — MRSA PCR SCREENING: MRSA by PCR: NEGATIVE

## 2015-08-06 MED ORDER — LORAZEPAM 2 MG/ML IJ SOLN
1.0000 mg | Freq: Once | INTRAMUSCULAR | Status: AC
  Administered 2015-08-06: 1 mg via INTRAVENOUS
  Filled 2015-08-06: qty 1

## 2015-08-06 MED ORDER — DEXTROSE 10 % IV SOLN: Freq: Once | INTRAVENOUS | Status: DC

## 2015-08-06 MED ORDER — LORAZEPAM 2 MG/ML IJ SOLN
0.5000 mg | Freq: Once | INTRAMUSCULAR | Status: AC
  Administered 2015-08-06: 0.5 mg via INTRAVENOUS
  Filled 2015-08-06: qty 1

## 2015-08-06 MED ORDER — DEXTROSE 5 % IV SOLN
Freq: Once | INTRAVENOUS | Status: AC
  Administered 2015-08-06: 18:00:00 via INTRAVENOUS

## 2015-08-06 MED ORDER — ONDANSETRON HCL 4 MG/2ML IJ SOLN: 4.0000 mg | Freq: Four times a day (QID) | INTRAMUSCULAR | Status: DC | PRN

## 2015-08-06 MED ORDER — DEXTROSE 50 % IV SOLN
25.0000 g | Freq: Once | INTRAVENOUS | Status: AC
  Administered 2015-08-06: 25 g via INTRAVENOUS

## 2015-08-06 MED ORDER — DEXTROSE 10 % IV BOLUS
75.0000 mL | Freq: Once | INTRAVENOUS | Status: AC
  Administered 2015-08-06: 75 mL via INTRAVENOUS

## 2015-08-06 MED ORDER — ONDANSETRON HCL 4 MG PO TABS: 4.0000 mg | ORAL_TABLET | Freq: Four times a day (QID) | ORAL | Status: DC | PRN

## 2015-08-06 MED ORDER — INSULIN ASPART 100 UNIT/ML ~~LOC~~ SOLN
10.0000 [IU] | Freq: Once | SUBCUTANEOUS | Status: AC
  Administered 2015-08-06: 10 [IU] via INTRAVENOUS
  Filled 2015-08-06: qty 1

## 2015-08-06 MED ORDER — SODIUM CHLORIDE 0.9 % IV SOLN: INTRAVENOUS | Status: DC

## 2015-08-06 MED ORDER — ALBUTEROL (5 MG/ML) CONTINUOUS INHALATION SOLN
10.0000 mg/h | INHALATION_SOLUTION | Freq: Once | RESPIRATORY_TRACT | Status: AC
  Administered 2015-08-06: 10 mg/h via RESPIRATORY_TRACT
  Filled 2015-08-06: qty 20

## 2015-08-06 MED ORDER — HEPARIN SODIUM (PORCINE) 5000 UNIT/ML IJ SOLN
5000.0000 [IU] | Freq: Three times a day (TID) | INTRAMUSCULAR | Status: DC
  Administered 2015-08-06 – 2015-08-09 (×6): 5000 [IU] via SUBCUTANEOUS
  Filled 2015-08-06 (×6): qty 1

## 2015-08-06 MED ORDER — SODIUM CHLORIDE 0.9% FLUSH
3.0000 mL | Freq: Two times a day (BID) | INTRAVENOUS | Status: DC
  Administered 2015-08-07 – 2015-08-08 (×3): 3 mL via INTRAVENOUS

## 2015-08-06 MED ORDER — POLYETHYLENE GLYCOL 3350 17 G PO PACK: 17.0000 g | PACK | Freq: Every day | ORAL | Status: DC | PRN

## 2015-08-06 MED ORDER — DEXTROSE 50 % IV SOLN
12.5000 g | INTRAVENOUS | Status: DC | PRN
  Administered 2015-08-06 – 2015-08-08 (×3): 12.5 g via INTRAVENOUS
  Filled 2015-08-06 (×4): qty 50

## 2015-08-06 MED ORDER — DEXTROSE 50 % IV SOLN
INTRAVENOUS | Status: AC
  Filled 2015-08-06: qty 50

## 2015-08-06 NOTE — H&P (Signed)
[facility] History and Physical  Jennifer Lawson [id number] DOB: [birth year] DOA: 08/06/2015  Referring physician: Emergency Department PCP: [facility]   CHIEF COMPLAINT:  Mental status changes                 HPI: Jennifer Lawson is a 55 y.o. female with multiple medical problems including ESRD on HD Tue/Thur/Sat. Patient brought from home for altered mental status. Patient currently on dialysis machine, she is sedated after receiving IV Ativan. Per EDP, patient's family brought her in for altered mental status and an unwitnessed fall at home this am.  Patient recently admitted to [facility] for psychotic episode. She has a history of bipolar affective disorder and according to that discharge summary patient has been admitted to several surrounding facilities for psychiatric reasons.  Patient given 1 mg of IV Ativan today for agitation  ED workup:  K+ 7.3  EKG:    Sinus rhythm, Vent rate 89                  Medications  dextrose 50 % solution 12.5 g (12.5 g Intravenous Given 08/06/15 1150)  dextrose 5 % solution (not administered)  dextrose 50 % solution 25 g (25 g Intravenous Given 08/06/15 0938)  insulin aspart (novoLOG) injection 10 Units (10 Units Intravenous Given 08/06/15 0110)  dextrose (D10W) 10% bolus 75 mL (75 mLs Intravenous Given 08/06/15 1115)  albuterol (PROVENTIL,VENTOLIN) solution continuous neb (10 mg/hr Nebulization Given 08/06/15 1118)  LORazepam (ATIVAN) injection 1 mg (1 mg Intravenous Given 08/06/15 1129)    Review of Systems  Unable to perform ROS: medical condition    Past Medical History  Diagnosis Date  . Mental disorder   . Depression   . Hypertension   . Overdose   . Tobacco use disorder 11/13/2012  . Complication of anesthesia     difficulty going to sleep  . Chronic kidney disease     06/11/13- not on dialysis  . Shortness of breath     lying down flat  . PTSD (post-traumatic stress disorder)   . Asthma   . COPD (chronic  obstructive pulmonary disease) ([place])   . Heart murmur   . GERD (gastroesophageal reflux disease)   . Seizures ([place])     "passsed out"  . History of blood transfusion   . Diabetes mellitus without complication [facility])     denies   Past Surgical History  Procedure Laterality Date  . Right knee replacement      she says it was last year.  . Esophagogastroduodenoscopy Left 11/14/2012    Procedure: ESOPHAGOGASTRODUODENOSCOPY (EGD);  Surgeon: [name], MD;  Location: [facility];  Service: Endoscopy;  Laterality: Left;  . Joint replacement Right     knee  . Parathyroidectomy    . Av fistula placement Right 06/12/2013    Procedure: ARTERIOVENOUS (AV) FISTULA CREATION; RIGHT  BASILIC VEIN TRANSPOSITION with Intraoperative ultrasound;  Surgeon: Mal [name], MD;  Location: [place];  Service: Vascular;  Laterality: Right;    SOCIAL HISTORY:  reports that she has been smoking Cigarettes and Cigars.  She has a 80 pack-year smoking history. She has never used smokeless tobacco. She reports that she does not drink alcohol or use illicit drugs. Lives:     Unable to verify    Assistive devices:   unknown.   Allergies  Allergen Reactions  . Codeine Sulfate Anaphylaxis    Daughter called about having this allergy   . Gabapentin Other (See  Comments) and Anaphylaxis    seizures  . Haldol [Haloperidol Lactate] Shortness Of Breath and Other (See Comments)    hallucinations  . Risperidone And Related Shortness Of Breath and Other (See Comments)    hallucinations  . Trazodone And Nefazodone Other (See Comments)    Makes pt lose balance and fall  . Invega [Paliperidone Er] Nausea And Vomiting  . Vistaril [Hydroxyzine Hcl] Nausea And Vomiting  . Hydrocodone-Acetaminophen Itching    Not allergic to aceetaminophen    Family History  Problem Relation Age of Onset  . Diabetes Mother   . Hyperlipidemia Mother   . Hypertension Mother   . Diabetes Father   . Hypertension Father   . Hyperlipidemia  Father     Prior to Admission medications   Medication Sig Start Date End Date Taking? Authorizing Provider  albuterol (PROVENTIL HFA;VENTOLIN HFA) 108 (90 Base) MCG/ACT inhaler Inhale 2 puffs into the lungs every 6 (six) hours as needed for wheezing or shortness of breath. 06/08/15   Clovis Fredrickson, MD  albuterol (PROVENTIL) (2.5 MG/3ML) 0.083% nebulizer solution Take 2.5 mg by nebulization every 6 (six) hours as needed for wheezing or shortness of breath.    Historical Provider, MD  b complex-vitamin c-folic acid (NEPHRO-VITE) 0.8 MG TABS tablet Take 1 tablet by mouth at bedtime. 07/23/15   Hildred Priest, MD  cinacalcet (SENSIPAR) 60 MG tablet Take 1 tablet (60 mg total) by mouth daily. Takes with largest meal once daily 07/23/15   Hildred Priest, MD  divalproex (DEPAKOTE ER) 500 MG 24 hr tablet Take 4 tablets (2,000 mg total) by mouth at bedtime. 07/22/15   Hildred Priest, MD  Docusate Sodium (DSS) 100 MG CAPS Take 100 mg by mouth 2 (two) times daily. 06/08/15   Clovis Fredrickson, MD  LORazepam (ATIVAN) 1 MG tablet Take 1 tablet (1 mg total) by mouth at bedtime. 07/22/15   Hildred Priest, MD  metoprolol tartrate (LOPRESSOR) 25 MG tablet Take 1 tablet (25 mg total) by mouth 2 (two) times daily. 07/23/15   Hildred Priest, MD  mometasone-formoterol (DULERA) 100-5 MCG/ACT AERO Inhale 2 puffs into the lungs 2 (two) times daily. 06/08/15   Clovis Fredrickson, MD  pantoprazole (PROTONIX) 40 MG tablet Take 1 tablet (40 mg total) by mouth daily. 07/23/15   Hildred Priest, MD  sevelamer carbonate (RENVELA) 800 MG tablet Take 2 tablets (1,600 mg total) by mouth 3 (three) times daily with meals. Takes 2 tabs with each meal and snacks as directed 07/23/15   Hildred Priest, MD  simvastatin (ZOCOR) 10 MG tablet Take 1 tablet (10 mg total) by mouth at bedtime. Reported on 05/30/2015 07/23/15   Hildred Priest, MD  ziprasidone  (GEODON) 60 MG capsule Take 2 capsules (120 mg total) by mouth daily with supper. 07/22/15   Hildred Priest, MD   PHYSICAL EXAM: Filed Vitals:   08/06/15 1235 08/06/15 1240 08/06/15 1250 08/06/15 1300  BP: 137/80 140/79  128/78  Pulse: 93 95 95 94  Temp: 98.3 F (36.8 C)     TempSrc: Axillary     Resp: 22     Weight:      SpO2: 98%       Wt Readings from Last 3 Encounters:  08/06/15 88.5 kg (195 lb 1.7 oz)  07/21/15 84.1 kg (185 lb 6.5 oz)  07/13/15 81.5 kg (179 lb 10.8 oz)    General:  Obese, black female lying in bed asleep.  Appears calm and comfortable Eyes: PER, keeps eyes closed  ENT: small laceration right side of nose toward bridge. Neck: no LAD, no masses Cardiovascular: RRR,  No LE edema.  Respiratory: Respirations even and unlabored. Normal respiratory effort. No wheezing in chest. Unable to assess posteriorly due to her lack of participation in exam.    Abdomen: soft, non-distended, non-tender, active bowel sounds. No obvious masses.  Skin: no rash seen on limited exam Musculoskeletal: grossly normal tone BUE/BLE Psychiatric: Unable to assess Neurologic: able to arouse with sternal rub but immediately falls back asleep.         LABS ON ADMISSION:    Basic Metabolic Panel:  Recent Labs Lab 08/06/15 0403  NA 141  K 7.3*  CL 112*  CO2 14*  GLUCOSE 50*  BUN 77*  CREATININE 5.79*  CALCIUM 9.8   Liver Function Tests:  Recent Labs Lab 08/06/15 0403  AST 24  ALT 9*  ALKPHOS 113  BILITOT 0.7  PROT 7.4  ALBUMIN 3.4*    CBC:  Recent Labs Lab 08/06/15 0403  WBC 4.2  HGB 11.6*  HCT 39.6  MCV 103.9*  PLT PLATELET CLUMPS NOTED ON SMEAR, COUNT APPEARS ADEQUATE    Radiological Exams on Admission: Dg Chest 1 View  08/06/2015  CLINICAL DATA:  55 year old female with altered mental status. Initial encounter. EXAM: CHEST 1 VIEW COMPARISON:  07/14/2015 and earlier. FINDINGS: Portable AP semi upright view at 1007 hours. Larger lung volumes.  Stable cardiomegaly and mediastinal contours. Nearly resolved previously-seen patchy opacity at the right lung base. Curvilinear left hilar region opacity persists but most resembles atelectasis. No pneumothorax. No pleural effusion or new pulmonary opacity identified. Visualized tracheal air column is within normal limits. IMPRESSION: Improved lung volumes and right lung base ventilation since 07/14/2015 with residual atelectasis. No new cardiopulmonary abnormality. Electronically Signed   By: Genevie Ann M.D.   On: 08/06/2015 10:29   Ct Head Wo Contrast  08/06/2015  CLINICAL DATA:  Pain following fall. History of chronic renal failure EXAM: CT HEAD WITHOUT CONTRAST CT CERVICAL SPINE WITHOUT CONTRAST TECHNIQUE: Multidetector CT imaging of the head and cervical spine was performed following the standard protocol without intravenous contrast. Multiplanar CT image reconstructions of the cervical spine were also generated. COMPARISON:  CT cervical spine July 29, 2012; head CT July 20, 2015 FINDINGS: CT HEAD FINDINGS The ventricles are normal in size and configuration. There is no intracranial mass, hemorrhage, extra-axial fluid collection, or midline shift. There is stable minimal small vessel disease in the centra semiovale bilaterally. Elsewhere gray-white compartments appear normal. No acute infarct evident. The bony calvarium appears intact. The mastoid air cells are clear. There is a retention cyst in the inferior posterior left maxillary antrum. No intraorbital lesions are evident. CT CERVICAL SPINE FINDINGS There is no demonstrable fracture or spondylolisthesis. Prevertebral soft tissues and predental space regions are within normal limits. There is moderate disc space narrowing at C5-6 and C6-7 with uncovertebral spurring at C5-6 and C7 bilaterally. There are anterior and posterior osteophytes at C5 and C6. There is facet hypertrophy at multiple levels bilaterally. Exit foraminal narrowing is most notable at  C5-6 and C6-7 bilaterally. There is no frank disc extrusion or high-grade stenosis. There are foci of carotid artery calcification bilaterally. IMPRESSION: CT head: Minimal periventricular small vessel disease. No intracranial mass, hemorrhage, or extra-axial fluid collection. No acute infarct. Retention cyst inferior left maxillary antrum. CT cervical spine: No demonstrable fracture or spondylolisthesis. Osteoarthritic change at several levels, most notably at C5-6 and C6-7, progressed slightly since the 2014 study.  There are foci of carotid artery calcification bilaterally. Electronically Signed   By: Lowella Grip III M.D.   On: 08/06/2015 10:22   Ct Cervical Spine Wo Contrast  08/06/2015  CLINICAL DATA:  Pain following fall. History of chronic renal failure EXAM: CT HEAD WITHOUT CONTRAST CT CERVICAL SPINE WITHOUT CONTRAST TECHNIQUE: Multidetector CT imaging of the head and cervical spine was performed following the standard protocol without intravenous contrast. Multiplanar CT image reconstructions of the cervical spine were also generated. COMPARISON:  CT cervical spine July 29, 2012; head CT July 20, 2015 FINDINGS: CT HEAD FINDINGS The ventricles are normal in size and configuration. There is no intracranial mass, hemorrhage, extra-axial fluid collection, or midline shift. There is stable minimal small vessel disease in the centra semiovale bilaterally. Elsewhere gray-white compartments appear normal. No acute infarct evident. The bony calvarium appears intact. The mastoid air cells are clear. There is a retention cyst in the inferior posterior left maxillary antrum. No intraorbital lesions are evident. CT CERVICAL SPINE FINDINGS There is no demonstrable fracture or spondylolisthesis. Prevertebral soft tissues and predental space regions are within normal limits. There is moderate disc space narrowing at C5-6 and C6-7 with uncovertebral spurring at C5-6 and C7 bilaterally. There are anterior and  posterior osteophytes at C5 and C6. There is facet hypertrophy at multiple levels bilaterally. Exit foraminal narrowing is most notable at C5-6 and C6-7 bilaterally. There is no frank disc extrusion or high-grade stenosis. There are foci of carotid artery calcification bilaterally. IMPRESSION: CT head: Minimal periventricular small vessel disease. No intracranial mass, hemorrhage, or extra-axial fluid collection. No acute infarct. Retention cyst inferior left maxillary antrum. CT cervical spine: No demonstrable fracture or spondylolisthesis. Osteoarthritic change at several levels, most notably at C5-6 and C6-7, progressed slightly since the 2014 study. There are foci of carotid artery calcification bilaterally. Electronically Signed   By: Lowella Grip III M.D.   On: 08/06/2015 10:22    ASSESSMENT / PLAN   Acute encephalopathy. She "hasn't been herself" per family's report to EDP. Unable to obtain history from patient right now as she is sedated after IV ativan. No evidence for infection. She was hypoglycemic on arrival to ED but behavior didn't change after IV dextrose. Etiology of encephalopathy not clear. Labs and head CTscan unrevealing. Of note patient has a history of psychiatric illnesses requiring admission to several surrounding hospitals (last one just two weeks ago).  -admit to Observation - telemetry bed  -home meds couldn't be verified. Will ask Pharmacy to try again later today or early am when awake. For now will continue meds based on recent [facility] discharge summary.   Fall at home this am. Head CTscan negative. Laceration to right upper nose. Nose slightly crooked. When awake, will need to reassess  CKD (chronic kidney disease) stage V requiring chronic dialysis ([facility]). Refused dialysis Tuesday. In dialysis now.  Hyperkalemia, secondary to ESRD. Should improve with dialysis.  -am bmet   Bipolar 1 disorder ([place]).  -If symptoms persist and medical evaluation negative she will need  Psych consult -continue home psych meds when alert enough to take    Asthma. Stable.  -Continue home inhalers when awake    CONSULTANTS:    Nephrology  Code Status: Unable to verify but presumed full based on previous admissions DVT Prophylaxis:  Heparin Family Communication:  Patient alert, oriented and understands plan of care.  Disposition Plan: Discharge to home in 24-48 hours   Time spent: 60 minutes Tye Savoy  NP [facility]  Pager [phone]

## 2015-08-06 NOTE — ED Notes (Signed)
Everardo Pacific, PA, made aware of CBG 51.  Verbal order received for D50.

## 2015-08-06 NOTE — ED Notes (Signed)
Nephrologist MD at bedside

## 2015-08-06 NOTE — ED Notes (Signed)
This RN gave 10 units of novolog IV at 1112, verified with Lytle Butte, RN.

## 2015-08-06 NOTE — ED Notes (Signed)
CBG- 51 (RN aware)

## 2015-08-06 NOTE — Consult Note (Signed)
[facility] Renal Consultation Note  Indication for Consultation:  Management of ESRD/hemodialysis; anemia, hypertension/volume and secondary hyperparathyroidism  HPI: Jennifer Lawson is a 55 y.o. female  With ESRD TTS OP HD , Bipolar Disorder (mult hosp to [facility] [place] last 3/ 16/17  Dc )vast HO nocompliance withHD missed last Tx 08/04/15 and other txs signs off early, less 2 hr tx 08/01/15. Family called EMS today secondary  AMS and to fall at home "200am". BS 50  In ER/ K 7.3 Ct Hd and cer spine no acute abnormality. She is obtunded in ER  Afebrile with bp stable/O2 sat 100- 90%.  No family to give ROS  And patient not able to give any history.       Past Medical History  Diagnosis Date  . Mental disorder   . Depression   . Hypertension   . Overdose   . Tobacco use disorder 11/13/2012  . Complication of anesthesia     difficulty going to sleep  . Chronic kidney disease     06/11/13- not on dialysis  . Shortness of breath     lying down flat  . PTSD (post-traumatic stress disorder)   . Asthma   . COPD (chronic obstructive pulmonary disease) ([place])   . Heart murmur   . GERD (gastroesophageal reflux disease)   . Seizures ([place])     "passsed out"  . History of blood transfusion   . Diabetes mellitus without complication [facility])     denies    Past Surgical History  Procedure Laterality Date  . Right knee replacement      she says it was last year.  . Esophagogastroduodenoscopy Left 11/14/2012    Procedure: ESOPHAGOGASTRODUODENOSCOPY (EGD);  Surgeon: [name], MD;  Location: [facility] ENDOSCOPY;  Service: Endoscopy;  Laterality: Left;  . Joint replacement Right     knee  . Parathyroidectomy    . Av fistula placement Right 06/12/2013    Procedure: ARTERIOVENOUS (AV) FISTULA CREATION; RIGHT  BASILIC VEIN TRANSPOSITION with Intraoperative ultrasound;  Surgeon: Mal [name], MD;  Location: [facility] OR;  Service: Vascular;  Laterality: Right;      Family History  Problem  Relation Age of Onset  . Diabetes Mother   . Hyperlipidemia Mother   . Hypertension Mother   . Diabetes Father   . Hypertension Father   . Hyperlipidemia Father       reports that she has been smoking Cigarettes and Cigars.  She has a 80 pack-year smoking history. She has never used smokeless tobacco. She reports that she does not drink alcohol or use illicit drugs.   Allergies  Allergen Reactions  . Codeine Sulfate Anaphylaxis    Daughter called about having this allergy   . Gabapentin Other (See Comments) and Anaphylaxis    seizures  . Haldol [Haloperidol Lactate] Shortness Of Breath and Other (See Comments)    hallucinations  . Risperidone And Related Shortness Of Breath and Other (See Comments)    hallucinations  . Trazodone And Nefazodone Other (See Comments)    Makes pt lose balance and fall  . Invega [Paliperidone Er] Nausea And Vomiting  . Vistaril [Hydroxyzine Hcl] Nausea And Vomiting  . Hydrocodone-Acetaminophen Itching    Not allergic to aceetaminophen    Prior to Admission medications   Medication Sig Start Date End Date Taking? Authorizing Provider  albuterol (PROVENTIL HFA;VENTOLIN HFA) 108 (90 Base) MCG/ACT inhaler Inhale 2 puffs into the lungs every 6 (six) hours as needed for wheezing or  shortness of breath. 06/08/15   Clovis Fredrickson, MD  albuterol (PROVENTIL) (2.5 MG/3ML) 0.083% nebulizer solution Take 2.5 mg by nebulization every 6 (six) hours as needed for wheezing or shortness of breath.    Historical Provider, MD  b complex-vitamin c-folic acid (NEPHRO-VITE) 0.8 MG TABS tablet Take 1 tablet by mouth at bedtime. 07/23/15   Hildred Priest, MD  cinacalcet (SENSIPAR) 60 MG tablet Take 1 tablet (60 mg total) by mouth daily. Takes with largest meal once daily 07/23/15   Hildred Priest, MD  divalproex (DEPAKOTE ER) 500 MG 24 hr tablet Take 4 tablets (2,000 mg total) by mouth at bedtime. 07/22/15   Hildred Priest, MD  Docusate  Sodium (DSS) 100 MG CAPS Take 100 mg by mouth 2 (two) times daily. 06/08/15   Clovis Fredrickson, MD  LORazepam (ATIVAN) 1 MG tablet Take 1 tablet (1 mg total) by mouth at bedtime. 07/22/15   Hildred Priest, MD  metoprolol tartrate (LOPRESSOR) 25 MG tablet Take 1 tablet (25 mg total) by mouth 2 (two) times daily. 07/23/15   Hildred Priest, MD  mometasone-formoterol (DULERA) 100-5 MCG/ACT AERO Inhale 2 puffs into the lungs 2 (two) times daily. 06/08/15   Clovis Fredrickson, MD  pantoprazole (PROTONIX) 40 MG tablet Take 1 tablet (40 mg total) by mouth daily. 07/23/15   Hildred Priest, MD  sevelamer carbonate (RENVELA) 800 MG tablet Take 2 tablets (1,600 mg total) by mouth 3 (three) times daily with meals. Takes 2 tabs with each meal and snacks as directed 07/23/15   Hildred Priest, MD  simvastatin (ZOCOR) 10 MG tablet Take 1 tablet (10 mg total) by mouth at bedtime. Reported on 05/30/2015 07/23/15   Hildred Priest, MD  ziprasidone (GEODON) 60 MG capsule Take 2 capsules (120 mg total) by mouth daily with supper. 07/22/15   Hildred Priest, MD    EZM:[id number]  Results for orders placed or performed during the hospital encounter of 08/06/15 (from the past 48 hour(s))  Comprehensive metabolic panel     Status: Abnormal   Collection Time: 08/06/15  4:03 AM  Result Value Ref Range   Sodium 141 135 - 145 mmol/L   Potassium 7.3 (HH) 3.5 - 5.1 mmol/L    Comment: SLIGHT HEMOLYSIS CRITICAL RESULT CALLED TO, READ BACK BY AND VERIFIED WITH: C.KING,RN 1010 08/06/15 CLARK,S    Chloride 112 (H) 101 - 111 mmol/L   CO2 14 (L) 22 - 32 mmol/L   Glucose, Bld 50 (L) 65 - 99 mg/dL   BUN 77 (H) 6 - 20 mg/dL   Creatinine, Ser 5.79 (H) 0.44 - 1.00 mg/dL   Calcium 9.8 8.9 - 10.3 mg/dL   Total Protein 7.4 6.5 - 8.1 g/dL   Albumin 3.4 (L) 3.5 - 5.0 g/dL   AST 24 15 - 41 U/L   ALT 9 (L) 14 - 54 U/L   Alkaline Phosphatase 113 38 - 126 U/L   Total Bilirubin  0.7 0.3 - 1.2 mg/dL   GFR calc non Af Amer 7 (L) >60 mL/min   GFR calc Af Amer 9 (L) >60 mL/min    Comment: (NOTE) The eGFR has been calculated using the CKD EPI equation. This calculation has not been validated in all clinical situations. eGFR's persistently <60 mL/min signify possible Chronic Kidney Disease.    Anion gap 15 5 - 15  CBC     Status: Abnormal   Collection Time: 08/06/15  4:03 AM  Result Value Ref Range   WBC 4.2 4.0 - 10.5  K/uL   RBC 3.81 (L) 3.87 - 5.11 MIL/uL   Hemoglobin 11.6 (L) 12.0 - 15.0 g/dL   HCT 39.6 36.0 - 46.0 %   MCV 103.9 (H) 78.0 - 100.0 fL   MCH 30.4 26.0 - 34.0 pg   MCHC 29.3 (L) 30.0 - 36.0 g/dL   RDW 20.4 (H) 11.5 - 15.5 %   Platelets  150 - 400 K/uL    PLATELET CLUMPS NOTED ON SMEAR, COUNT APPEARS ADEQUATE  I-Stat Troponin, ED (not at [facility])     Status: None   Collection Time: 08/06/15  9:06 AM  Result Value Ref Range   Troponin i, poc 0.03 0.00 - 0.08 ng/mL   Comment 3            Comment: Due to the release kinetics of cTnI, a negative result within the first hours of the onset of symptoms does not rule out myocardial infarction with certainty. If myocardial infarction is still suspected, repeat the test at appropriate intervals.   I-Stat CG4 Lactic Acid, ED     Status: None   Collection Time: 08/06/15  9:09 AM  Result Value Ref Range   Lactic Acid, Venous 1.66 0.5 - 2.0 mmol/L  CBG monitoring, ED     Status: Abnormal   Collection Time: 08/06/15  9:33 AM  Result Value Ref Range   Glucose-Capillary 51 (L) 65 - 99 mg/dL  CBG monitoring, ED     Status: Abnormal   Collection Time: 08/06/15  9:49 AM  Result Value Ref Range   Glucose-Capillary 112 (H) 65 - 99 mg/dL  CBG monitoring, ED     Status: Abnormal   Collection Time: 08/06/15 10:40 AM  Result Value Ref Range   Glucose-Capillary 109 (H) 65 - 99 mg/dL     ROS: see hpi  Physical Exam: Filed Vitals:   08/06/15 1042 08/06/15 1045  BP: 132/88 131/79  Pulse: 88 90  Temp:     Resp: 27 25     General: obese AAF Obtunded/ opens eyes to sternal rub / grumbling speech to questions HEENT:  MMM  Neck: pos jvd  Heart: RRR, no rub, no gallop Lungs: Scattered coarse breath sounds with Breathing tx in process.  Abdomen: bs pos.soft, NT, ND Extremities: bilat 1+ pedal edema Skin: no overt rash Neuro: obtunded/ opens eyes to sternal rub/ does not follow commands Dialysis Access: pos. Bruit  RUA AVF  Dialysis Orders: Center: [facility]  on TTS . EDW 83kg HD Bath 2k, 2ca  Time 4hr Heparin NONE. Access RUA AVF     Hectorol 4 mcg IV/HD     Other last op labs hgb 9.8 07/30/15 ca 9.2 phos 3.3  PTH 1739 07/02/15  Assessment/Plan 1. Hyperkalemia sec missed HD- emergent hd today reck lab pre hd after meds given in ER 2. Volume overload wt Up  5.5 kg per ER wt - missed last tx and less 2 hrs TX on 08/01/15 HD 3.  DM type 2 with Hypogylcemia- ( no meds on med list ) - RX Per admit 4. ESRD -  Hd normal schedule TTS ([facility]) 5. Altered MS- with Bipolar affective d/o mixed severe w/ psychotic behavior?hypoglycemia playing effect/?  Meds / -wu per admit CT Head NAD 6. Hypertension/volume  - in ER 149/87  On Metop/  uf today on HD 7. Anemia  - hgb 11.6 no esa or fe as op  8. Metabolic bone disease -  Hectorol on HD / Renvela binder 9. Bipolar affective  d/o mixed severe w/ psychotic behavior with Ho recently [facility] admit - meds 10. HO COPD-on inhaler/ rx per admit  Ernest Haber, PA-C [place] [phone] 08/06/2015, 11:19 AM      I have seen and examined this patient and agree with plan as outlined by Ernest Haber, PA-C.  She remains obtunded, plan for emergent hemodialysis due to life-threatening hyperkalemia.  Will need admission due to AMS and profound hypogylcemia.  Continue with D10 gtt until evaluated by admitting service. [place] John A Daylen Hack,MD 08/06/2015 12:45 PM

## 2015-08-06 NOTE — ED Notes (Signed)
Pt arrives from home via [facility].  EMS reports family states pt has been "not like self" for 48 hours, decreased balance and refusal to go to dialysis on Tuesday.  EMS reports family reports pt had unwitnessed fall today at 0200.  Pt confused, not following commands.  Trauma noted to nose. C collar and KED in place.

## 2015-08-06 NOTE — ED Notes (Signed)
Pt returned to room from CT and x-ray.

## 2015-08-06 NOTE — ED Notes (Signed)
Pt agitated, trying to remove IV,  [place], EMT, at bedside, wrapped IV site in curlex.

## 2015-08-06 NOTE — Progress Notes (Signed)
Admission note:  Arrival Method: Stretcher from HD Mental Orientation: Unable to determine. Pt lethargic Telemetry: 6E08 CCMD notified Assessment:See flowsheet Skin: Bruising on back; R back, buttocks and thigh, bruising on sternum. Abrasion on mid nose. IV: R A/C  Pain: None Tubes: N./A Safety Measures: Bed in lowest position, non skid socks placed, call light within reach, bed alarm activated. Fall Prevention Safety Plan: Unable to review with pt.  Admission Screening: Unable to complete due to pt's altered mental status. No family present 95 Orientation: Patient has been oriented to the unit, staff and to the room.  Orders have been reviewed and implemented. Will continue to assess and monitor pt.   Jennifer Lawson

## 2015-08-06 NOTE — ED Provider Notes (Signed)
CSN: [id number]     Arrival date & time 08/06/15  0815 History   First MD Initiated Contact with Patient 08/06/15 [phone]     Chief Complaint  Patient presents with  . Altered Mental Status  . Fall   (Consider location/radiation/quality/duration/timing/severity/associated sxs/prior Treatment) Patient is a 55 y.o. female presenting with altered mental status and fall. The history is provided by the EMS personnel. No language interpreter was used.  Altered Mental Status Fall   level V caveat Ms. Xie is a 55 y.o female with a history of hypertension, bipolar disorder, psychosis, CKD (on dialysis T,R,S), anemia of chronic kidney disease, hyperparathyroidism, asthma, COPD, seizures, and diabetes who presents via EMS from home after family members report that she was not herself for the last 2 days. She had decreased balance and refused to go to dialysis on Tuesday. She is due for dialysis today. EMS also reports that the family thinks she may have had an unwitnessed fall at 2:00 this morning. He is normally verbal and ambulates on her own. She was recently admitted to behavioral health at the beginning of this month.  Past Medical History  Diagnosis Date  . Mental disorder   . Depression   . Hypertension   . Overdose   . Tobacco use disorder 11/13/2012  . Complication of anesthesia     difficulty going to sleep  . Chronic kidney disease     06/11/13- not on dialysis  . Shortness of breath     lying down flat  . PTSD (post-traumatic stress disorder)   . Asthma   . COPD (chronic obstructive pulmonary disease) ([place])   . Heart murmur   . GERD (gastroesophageal reflux disease)   . Seizures ([place])     "passsed out"  . History of blood transfusion   . Diabetes mellitus without complication [facility])     denies   Past Surgical History  Procedure Laterality Date  . Right knee replacement      she says it was last year.  . Esophagogastroduodenoscopy Left 11/14/2012    Procedure:  ESOPHAGOGASTRODUODENOSCOPY (EGD);  Surgeon: [name], MD;  Location: [facility];  Service: Endoscopy;  Laterality: Left;  . Joint replacement Right     knee  . Parathyroidectomy    . Av fistula placement Right 06/12/2013    Procedure: ARTERIOVENOUS (AV) FISTULA CREATION; RIGHT  BASILIC VEIN TRANSPOSITION with Intraoperative ultrasound;  Surgeon: Mal [name], MD;  Location: [facility] OR;  Service: Vascular;  Laterality: Right;   Family History  Problem Relation Age of Onset  . Diabetes Mother   . Hyperlipidemia Mother   . Hypertension Mother   . Diabetes Father   . Hypertension Father   . Hyperlipidemia Father    Social History  Substance Use Topics  . Smoking status: Current Every Day Smoker -- 2.00 packs/day for 40 years    Types: Cigarettes, Cigars  . Smokeless tobacco: Never Used  . Alcohol Use: No     Comment: none in over a month per daughter   OB History    No data available     Review of Systems  Unable to perform ROS: Mental status change      Allergies  Codeine sulfate; Gabapentin; Haldol; Risperidone and related; Trazodone and nefazodone; Invega; Vistaril; and Hydrocodone-acetaminophen  Home Medications   Prior to Admission medications   Medication Sig Start Date End Date Taking? Authorizing Provider  albuterol (PROVENTIL HFA;VENTOLIN HFA) 108 (90 Base) MCG/ACT inhaler Inhale 2 puffs into the lungs  every 6 (six) hours as needed for wheezing or shortness of breath. 06/08/15   Clovis Fredrickson, MD  albuterol (PROVENTIL) (2.5 MG/3ML) 0.083% nebulizer solution Take 2.5 mg by nebulization every 6 (six) hours as needed for wheezing or shortness of breath.    Historical Provider, MD  b complex-vitamin c-folic acid (NEPHRO-VITE) 0.8 MG TABS tablet Take 1 tablet by mouth at bedtime. 07/23/15   Hildred Priest, MD  cinacalcet (SENSIPAR) 60 MG tablet Take 1 tablet (60 mg total) by mouth daily. Takes with largest meal once daily 07/23/15   Hildred Priest, MD  divalproex (DEPAKOTE ER) 500 MG 24 hr tablet Take 4 tablets (2,000 mg total) by mouth at bedtime. 07/22/15   Hildred Priest, MD  Docusate Sodium (DSS) 100 MG CAPS Take 100 mg by mouth 2 (two) times daily. 06/08/15   Clovis Fredrickson, MD  LORazepam (ATIVAN) 1 MG tablet Take 1 tablet (1 mg total) by mouth at bedtime. 07/22/15   Hildred Priest, MD  metoprolol tartrate (LOPRESSOR) 25 MG tablet Take 1 tablet (25 mg total) by mouth 2 (two) times daily. 07/23/15   Hildred Priest, MD  mometasone-formoterol (DULERA) 100-5 MCG/ACT AERO Inhale 2 puffs into the lungs 2 (two) times daily. 06/08/15   Clovis Fredrickson, MD  pantoprazole (PROTONIX) 40 MG tablet Take 1 tablet (40 mg total) by mouth daily. 07/23/15   Hildred Priest, MD  sevelamer carbonate (RENVELA) 800 MG tablet Take 2 tablets (1,600 mg total) by mouth 3 (three) times daily with meals. Takes 2 tabs with each meal and snacks as directed 07/23/15   Hildred Priest, MD  simvastatin (ZOCOR) 10 MG tablet Take 1 tablet (10 mg total) by mouth at bedtime. Reported on 05/30/2015 07/23/15   Hildred Priest, MD  ziprasidone (GEODON) 60 MG capsule Take 2 capsules (120 mg total) by mouth daily with supper. 07/22/15   Hildred Priest, MD   BP 111/63 mmHg  Pulse 93  Temp(Src) 98.3 F (36.8 C) (Axillary)  Resp 29  Wt 88.5 kg  SpO2 98%  LMP 05/09/2008 Physical Exam  Constitutional: She appears well-developed and well-nourished. No distress.  HENT:  Head: Normocephalic and atraumatic.  Eyes: Conjunctivae are normal.  Neck: Normal range of motion. Neck supple.  Cardiovascular: Normal rate and regular rhythm.   Pulmonary/Chest: Effort normal and breath sounds normal. No respiratory distress. She has no wheezes.  Abdominal: Soft.  Morbidly obese. Nondistended abdomen.  Musculoskeletal: Normal range of motion.  Neurological:  Verbal but incoherent. Unable to  follow commands. On 2 L of oxygen.  Skin: Skin is warm and dry.  Nursing note and vitals reviewed.   ED Course  Procedures (including critical care time) Labs Review Labs Reviewed  COMPREHENSIVE METABOLIC PANEL - Abnormal; Notable for the following:    Potassium 7.3 (*)    Chloride 112 (*)    CO2 14 (*)    Glucose, Bld 50 (*)    BUN 77 (*)    Creatinine, Ser 5.79 (*)    Albumin 3.4 (*)    ALT 9 (*)    GFR calc non Af Amer 7 (*)    GFR calc Af Amer 9 (*)    All other components within normal limits  CBC - Abnormal; Notable for the following:    RBC 3.81 (*)    Hemoglobin 11.6 (*)    MCV 103.9 (*)    MCHC 29.3 (*)    RDW 20.4 (*)    All other components within normal limits  BASIC METABOLIC PANEL - Abnormal; Notable for the following:    Potassium 6.1 (*)    CO2 21 (*)    BUN 77 (*)    Creatinine, Ser 5.84 (*)    GFR calc non Af Amer 7 (*)    GFR calc Af Amer 9 (*)    All other components within normal limits  CBG MONITORING, ED - Abnormal; Notable for the following:    Glucose-Capillary 51 (*)    All other components within normal limits  CBG MONITORING, ED - Abnormal; Notable for the following:    Glucose-Capillary 112 (*)    All other components within normal limits  CBG MONITORING, ED - Abnormal; Notable for the following:    Glucose-Capillary 109 (*)    All other components within normal limits  CBG MONITORING, ED - Abnormal; Notable for the following:    Glucose-Capillary 58 (*)    All other components within normal limits  I-STAT VENOUS BLOOD GAS, ED - Abnormal; Notable for the following:    pH, Ven 7.183 (*)    pCO2, Ven 58.7 (*)    pO2, Ven 125.0 (*)    Acid-base deficit 7.0 (*)    All other components within normal limits  CULTURE, BLOOD (ROUTINE X 2)  CULTURE, BLOOD (ROUTINE X 2)  BLOOD GAS, VENOUS  URINALYSIS, ROUTINE W REFLEX MICROSCOPIC (NOT AT [facility])  URINE RAPID DRUG SCREEN, HOSP PERFORMED  I-STAT CG4 LACTIC ACID, ED  I-STAT TROPOININ, ED   I-STAT CG4 LACTIC ACID, ED  CBG MONITORING, ED  CBG MONITORING, ED    Imaging Review Dg Chest 1 View  08/06/2015  CLINICAL DATA:  55 year old female with altered mental status. Initial encounter. EXAM: CHEST 1 VIEW COMPARISON:  07/14/2015 and earlier. FINDINGS: Portable AP semi upright view at 1007 hours. Larger lung volumes. Stable cardiomegaly and mediastinal contours. Nearly resolved previously-seen patchy opacity at the right lung base. Curvilinear left hilar region opacity persists but most resembles atelectasis. No pneumothorax. No pleural effusion or new pulmonary opacity identified. Visualized tracheal air column is within normal limits. IMPRESSION: Improved lung volumes and right lung base ventilation since 07/14/2015 with residual atelectasis. No new cardiopulmonary abnormality. Electronically Signed   By: Genevie Ann M.D.   On: 08/06/2015 10:29   Ct Head Wo Contrast  08/06/2015  CLINICAL DATA:  Pain following fall. History of chronic renal failure EXAM: CT HEAD WITHOUT CONTRAST CT CERVICAL SPINE WITHOUT CONTRAST TECHNIQUE: Multidetector CT imaging of the head and cervical spine was performed following the standard protocol without intravenous contrast. Multiplanar CT image reconstructions of the cervical spine were also generated. COMPARISON:  CT cervical spine July 29, 2012; head CT July 20, 2015 FINDINGS: CT HEAD FINDINGS The ventricles are normal in size and configuration. There is no intracranial mass, hemorrhage, extra-axial fluid collection, or midline shift. There is stable minimal small vessel disease in the centra semiovale bilaterally. Elsewhere gray-white compartments appear normal. No acute infarct evident. The bony calvarium appears intact. The mastoid air cells are clear. There is a retention cyst in the inferior posterior left maxillary antrum. No intraorbital lesions are evident. CT CERVICAL SPINE FINDINGS There is no demonstrable fracture or spondylolisthesis. Prevertebral soft  tissues and predental space regions are within normal limits. There is moderate disc space narrowing at C5-6 and C6-7 with uncovertebral spurring at C5-6 and C7 bilaterally. There are anterior and posterior osteophytes at C5 and C6. There is facet hypertrophy at multiple levels bilaterally. Exit foraminal narrowing is most notable at C5-6  and C6-7 bilaterally. There is no frank disc extrusion or high-grade stenosis. There are foci of carotid artery calcification bilaterally. IMPRESSION: CT head: Minimal periventricular small vessel disease. No intracranial mass, hemorrhage, or extra-axial fluid collection. No acute infarct. Retention cyst inferior left maxillary antrum. CT cervical spine: No demonstrable fracture or spondylolisthesis. Osteoarthritic change at several levels, most notably at C5-6 and C6-7, progressed slightly since the 2014 study. There are foci of carotid artery calcification bilaterally. Electronically Signed   By: Lowella Grip III M.D.   On: 08/06/2015 10:22   Ct Cervical Spine Wo Contrast  08/06/2015  CLINICAL DATA:  Pain following fall. History of chronic renal failure EXAM: CT HEAD WITHOUT CONTRAST CT CERVICAL SPINE WITHOUT CONTRAST TECHNIQUE: Multidetector CT imaging of the head and cervical spine was performed following the standard protocol without intravenous contrast. Multiplanar CT image reconstructions of the cervical spine were also generated. COMPARISON:  CT cervical spine July 29, 2012; head CT July 20, 2015 FINDINGS: CT HEAD FINDINGS The ventricles are normal in size and configuration. There is no intracranial mass, hemorrhage, extra-axial fluid collection, or midline shift. There is stable minimal small vessel disease in the centra semiovale bilaterally. Elsewhere gray-white compartments appear normal. No acute infarct evident. The bony calvarium appears intact. The mastoid air cells are clear. There is a retention cyst in the inferior posterior left maxillary antrum. No  intraorbital lesions are evident. CT CERVICAL SPINE FINDINGS There is no demonstrable fracture or spondylolisthesis. Prevertebral soft tissues and predental space regions are within normal limits. There is moderate disc space narrowing at C5-6 and C6-7 with uncovertebral spurring at C5-6 and C7 bilaterally. There are anterior and posterior osteophytes at C5 and C6. There is facet hypertrophy at multiple levels bilaterally. Exit foraminal narrowing is most notable at C5-6 and C6-7 bilaterally. There is no frank disc extrusion or high-grade stenosis. There are foci of carotid artery calcification bilaterally. IMPRESSION: CT head: Minimal periventricular small vessel disease. No intracranial mass, hemorrhage, or extra-axial fluid collection. No acute infarct. Retention cyst inferior left maxillary antrum. CT cervical spine: No demonstrable fracture or spondylolisthesis. Osteoarthritic change at several levels, most notably at C5-6 and C6-7, progressed slightly since the 2014 study. There are foci of carotid artery calcification bilaterally. Electronically Signed   By: Lowella Grip III M.D.   On: 08/06/2015 10:22   I have personally reviewed and evaluated these images and lab results as part of my medical decision-making.   EKG Interpretation   Date/Time:  Thursday August 06 2015 09:14:06 EDT Ventricular Rate:  89 PR Interval:  187 QRS Duration: 98 QT Interval:  359 QTC Calculation: 437 R Axis:   12 Text Interpretation:  Sinus rhythm No old tracing to compare Confirmed by  [facility]  MD, ELLIOTT [phone]) on 08/06/2015 9:25:07 AM      MDM   Final diagnoses:  Hyperkalemia  Hypoglycemia  Altered mental status, unspecified altered mental status type  Patient presents for AMS from home. Family states that she has not been her usual self the last 2 days. She missed dialysis 2 days ago and is due for dialysis today. After D50 patient was able to communicate but is still somnolent. She states that she  wants to go home. Is unable to tell me where she gets her dialysis or her name. She has a potassium of 7.3. Glucose of 50. Hemoglobin is stable. CT head and cervical spine show no acute abnormality.  I spoke to Dr. Marval Regal with nephrology who agreed with plan  for insulin and albuterol treatment. He is familiar with the patient. He will dialyze her as soon as possible.  Patient will need admission for hypoglycemia and AMS. I discussed this patient with the hospitalist who will admit.     Ottie Glazier, PA-C 08/06/15 [place], MD 08/06/15 1550

## 2015-08-06 NOTE — ED Notes (Signed)
Patient transported to CT 

## 2015-08-06 NOTE — Procedures (Signed)
Patient was seen on dialysis and the procedure was supervised. BFR 200 Via RAVF BP is 131/79.  Patient remains unresponsive but hemodynamically stable.  Continue with D10 drip until seen by hospitalist as her most recent glucose was 89.

## 2015-08-06 NOTE — ED Provider Notes (Signed)
  Face-to-face evaluation   History: She presents for evaluation of altered mental status, and missing dialysis. She was recently hospitalized in a psychiatric facility.  Physical exam: Patient is obtunded. She is breathing rapidly. She has significant jugular venous distention. Tachypnea, with a normal heart rate.    Medical screening examination/treatment/procedure(s) were conducted as a shared visit with non-physician practitioner(s) and myself.  I personally evaluated the patient during the encounter  Daleen Bo, MD 08/06/15 1550

## 2015-08-06 NOTE — ED Notes (Signed)
Pt agitated, pulling cords off, attempted to pull out IV, unable to be redirected, trying to get out of bed. Toiletting offered, pt assessed for pain. Everardo Pacific, PA, made aware

## 2015-08-07 DIAGNOSIS — N186 End stage renal disease: Secondary | ICD-10-CM | POA: Diagnosis not present

## 2015-08-07 DIAGNOSIS — N189 Chronic kidney disease, unspecified: Secondary | ICD-10-CM | POA: Diagnosis not present

## 2015-08-07 DIAGNOSIS — D638 Anemia in other chronic diseases classified elsewhere: Secondary | ICD-10-CM | POA: Diagnosis not present

## 2015-08-07 DIAGNOSIS — R4182 Altered mental status, unspecified: Secondary | ICD-10-CM | POA: Diagnosis not present

## 2015-08-07 DIAGNOSIS — G9340 Encephalopathy, unspecified: Secondary | ICD-10-CM | POA: Diagnosis not present

## 2015-08-07 DIAGNOSIS — D631 Anemia in chronic kidney disease: Secondary | ICD-10-CM

## 2015-08-07 DIAGNOSIS — E875 Hyperkalemia: Secondary | ICD-10-CM | POA: Diagnosis not present

## 2015-08-07 LAB — BASIC METABOLIC PANEL
Anion gap: 8 (ref 5–15)
BUN: 23 mg/dL — ABNORMAL HIGH (ref 6–20)
CO2: 26 mmol/L (ref 22–32)
Calcium: 9.2 mg/dL (ref 8.9–10.3)
Chloride: 101 mmol/L (ref 101–111)
Creatinine, Ser: 3.3 mg/dL — ABNORMAL HIGH (ref 0.44–1.00)
GFR calc Af Amer: 17 mL/min — ABNORMAL LOW (ref >60)
GFR calc non Af Amer: 15 mL/min — ABNORMAL LOW (ref >60)
Glucose, Bld: 72 mg/dL (ref 65–99)
Potassium: 4 mmol/L (ref 3.5–5.1)
Sodium: 135 mmol/L (ref 135–145)

## 2015-08-07 LAB — CBC
HCT: 29.4 % — ABNORMAL LOW (ref 36.0–46.0)
Hemoglobin: 9.1 g/dL — ABNORMAL LOW (ref 12.0–15.0)
MCH: 30.2 pg (ref 26.0–34.0)
MCHC: 31 g/dL (ref 30.0–36.0)
MCV: 97.7 fL (ref 78.0–100.0)
Platelets: 218 10*3/uL (ref 150–400)
RBC: 3.01 MIL/uL — ABNORMAL LOW (ref 3.87–5.11)
RDW: 19.8 % — ABNORMAL HIGH (ref 11.5–15.5)
WBC: 4.4 10*3/uL (ref 4.0–10.5)

## 2015-08-07 LAB — GLUCOSE, CAPILLARY
Glucose-Capillary: 83 mg/dL (ref 65–99)
Glucose-Capillary: 84 mg/dL (ref 65–99)
Glucose-Capillary: 75 mg/dL (ref 65–99)
Glucose-Capillary: 70 mg/dL (ref 65–99)
Glucose-Capillary: 81 mg/dL (ref 65–99)
Glucose-Capillary: 76 mg/dL (ref 65–99)

## 2015-08-07 MED ORDER — DOXERCALCIFEROL 4 MCG/2ML IV SOLN
4.0000 ug | INTRAVENOUS | Status: DC
  Administered 2015-08-08: 4 ug via INTRAVENOUS
  Filled 2015-08-07: qty 2

## 2015-08-07 MED ORDER — DARBEPOETIN ALFA 40 MCG/0.4ML IJ SOSY
40.0000 ug | PREFILLED_SYRINGE | INTRAMUSCULAR | Status: DC
  Administered 2015-08-08: 40 ug via INTRAVENOUS
  Filled 2015-08-07: qty 0.4

## 2015-08-07 MED ORDER — LORAZEPAM 2 MG/ML IJ SOLN
0.5000 mg | Freq: Once | INTRAMUSCULAR | Status: AC
  Administered 2015-08-07: 0.5 mg via INTRAVENOUS
  Filled 2015-08-07: qty 1

## 2015-08-07 NOTE — Plan of Care (Signed)
Problem: Safety: Goal: Ability to remain free from injury will improve Outcome: Progressing Currently very confused

## 2015-08-07 NOTE — Progress Notes (Signed)
Patient ID: Jennifer Lawson, female   DOB: 21-May-1960, 55 y.o.   MRN: [id number] [facility] PROGRESS NOTE  Jennifer Lawson [id number] DOB: [birth year] DOA: 08/06/2015 PCP: [facility]  Brief narrative:    55 y.o. female with multiple medical problems including ESRD on HD Tue/Thur/Sat, history of bipolar disorder, hypertension. Patient presented from home to [facility] with worsening mental status changes. Also, per family report at the time of the admission, patient apparently fell but this was unwitnessed fall.  Patient was hemodynamically stable on the admission. Blood work was significant for potassium of 7.3, CO2 14 and creatinine 5.79. CT head and cervical spine showed minimal periventricular small vessel disease otherwise no acute intracranial findings. She was seen by renal in consultation. She had HD 3/30.  Assessment/Plan:    Principal problem: Acute encephalopathy -Likely from electrolyte derangement secondary to missed dialysis - Continue to monitor her mental status   Active problems: Hyperkalemia - Secondary to missed hemodialysis - Underwent emergent dialysis 08/06/2015 - Potassium now within normal limits  End-stage renal disease on hemodialysis - Patient is receiving hemodialysis Tuesday, Thursday and Saturday - Management per renal  Metabolic bone disease - Per renal  - On Hectorol with HD, Renvela binder  Anemia of chronic renal disease - Hemoglobin stable, 9.1   DVT Prophylaxis  - Heparin subQ ordered   Code Status: Full.  Family Communication:  plan of care discussed with the patient Disposition Plan: likely by 08/10/2015, once medically stable, home versus SNF  IV access:  Peripheral IV  Procedures and diagnostic studies:    Dg Chest 1 View 08/06/2015  Improved lung volumes and right lung base ventilation since 07/14/2015 with residual atelectasis. No new cardiopulmonary abnormality.   Ct Head Wo Contrast 08/06/2015   CT  head: Minimal periventricular small vessel disease. No intracranial mass, hemorrhage, or extra-axial fluid collection. No acute infarct. Retention cyst inferior left maxillary antrum. CT cervical spine: No demonstrable fracture or spondylolisthesis. Osteoarthritic change at several levels, most notably at C5-6 and C6-7, progressed slightly since the 2014 study. There are foci of carotid artery calcification bilaterally.   Ct Cervical Spine Wo Contrast 08/06/2015   CT head: Minimal periventricular small vessel disease. No intracranial mass, hemorrhage, or extra-axial fluid collection. No acute infarct. Retention cyst inferior left maxillary antrum. CT cervical spine: No demonstrable fracture or spondylolisthesis. Osteoarthritic change at several levels, most notably at C5-6 and C6-7, progressed slightly since the 2014 study. There are foci of carotid artery calcification bilaterally.   Medical Consultants:  Nephrology  Other Consultants:  None   IAnti-Infectives:   None    Leisa Lenz, MD  [facility] Pager [phone]  Time spent in minutes: 25 minutes  If 7PM-7AM, please contact night-coverage [email or web address] Password [facility] 08/07/2015, 12:37 PM     HPI/Subjective: No acute overnight events. No resp distress.   Objective: Filed Vitals:   08/06/15 2054 08/06/15 2055 08/07/15 0545 08/07/15 0822  BP:  155/77 152/82 152/71  Pulse:  95 89 89  Temp:  98.6 F (37 C) 98.6 F (37 C) 99.4 F (37.4 C)  TempSrc:    Oral  Resp:  16 18 18   Weight: 83.326 kg (183 lb 11.2 oz)     SpO2:  100% 98% 96%    Intake/Output Summary (Last 24 hours) at 08/07/15 1237 Last data filed at 08/07/15 [id number]  Gross per 24 hour  Intake      0 ml  Output   2992 ml  Net  -  2992 ml    Exam:   General:  Pt is alert, not in acute distress  Cardiovascular: Regular rate and rhythm, S1/S2 appreciated   Respiratory: Clear to auscultation bilaterally, no wheezing, no crackles, no rhonchi  Abdomen:  Soft, non tender, non distended, bowel sounds present  Extremities: No edema, pulses palpable bilaterally  Neuro: Grossly nonfocal  Data Reviewed: Basic Metabolic Panel:  Recent Labs Lab 08/06/15 0403 08/06/15 1254 08/07/15 0524  NA 141 140 135  K 7.3* 6.1* 4.0  CL 112* 110 101  CO2 14* 21* 26  GLUCOSE 50* 84 72  BUN 77* 77* 23*  CREATININE 5.79* 5.84* 3.30*  CALCIUM 9.8 9.4 9.2   Liver Function Tests:  Recent Labs Lab 08/06/15 0403  AST 24  ALT 9*  ALKPHOS 113  BILITOT 0.7  PROT 7.4  ALBUMIN 3.4*   No results for input(s): LIPASE, AMYLASE in the last 168 hours. No results for input(s): AMMONIA in the last 168 hours. CBC:  Recent Labs Lab 08/06/15 0403 08/07/15 0524  WBC 4.2 4.4  HGB 11.6* 9.1*  HCT 39.6 29.4*  MCV 103.9* 97.7  PLT PLATELET CLUMPS NOTED ON SMEAR, COUNT APPEARS ADEQUATE 218   Cardiac Enzymes: No results for input(s): CKTOTAL, CKMB, CKMBINDEX, TROPONINI in the last 168 hours. BNP: Invalid input(s): POCBNP CBG:  Recent Labs Lab 08/06/15 1433 08/06/15 1558 08/06/15 1640 08/06/15 1758 08/07/15 0356  GLUCAP 81 83 84 72 75    Recent Results (from the past 240 hour(s))  Blood culture (routine x 2)     Status: None (Preliminary result)   Collection Time: 08/06/15  9:03 AM  Result Value Ref Range Status   Specimen Description BLOOD LEFT HAND  Final   Special Requests IN PEDIATRIC BOTTLE 1CC  Final   Culture NO GROWTH 1 DAY  Final   Report Status PENDING  Incomplete  Blood culture (routine x 2)     Status: None (Preliminary result)   Collection Time: 08/06/15  9:30 AM  Result Value Ref Range Status   Specimen Description BLOOD LEFT HAND  Final   Special Requests BOTTLES DRAWN AEROBIC AND ANAEROBIC 5CC  Final   Culture NO GROWTH 1 DAY  Final   Report Status PENDING  Incomplete  MRSA PCR Screening     Status: None   Collection Time: 08/06/15  6:25 PM  Result Value Ref Range Status   MRSA by PCR NEGATIVE NEGATIVE Final    Comment:         The GeneXpert MRSA Assay (FDA approved for NASAL specimens only), is one component of a comprehensive MRSA colonization surveillance program. It is not intended to diagnose MRSA infection nor to guide or monitor treatment for MRSA infections.      Scheduled Meds: . [START ON 08/08/2015] darbepoetin (ARANESP) injection - DIALYSIS  40 mcg Intravenous Q Sat-HD  . [START ON 08/08/2015] doxercalciferol  4 mcg Intravenous Q T,Th,Sa-HD  . heparin  5,000 Units Subcutaneous 3 times per day  . sodium chloride flush  3 mL Intravenous Q12H   Continuous Infusions:

## 2015-08-07 NOTE — Progress Notes (Signed)
[facility] Progress Note  Assessment/Plan: 1. Hyperkalemia sec missed HD- emergent hd 08/06/15. Now resolved. K+ 4.0 2. DM type 2 with Hypogylcemia- ( no meds on med list ) - RX Per admit 3. ESRD - TTS @ [facility]. Will have HD tomorrow on schedule  4. Altered MS- with Bipolar affective d/o mixed severe w/ psychotic behavior?hypoglycemia playing effect/? Meds / -wu per admit CT Head NAD 5. Hypertension/volume - in ER 149/87 On Metop/Was 5.5 Kg up from EDW on admission. HD 08/06/15 net uf 2991. Post wt 84.2 kg. Wt today 83.2 kg-at OP EDW. Will have HD tomorrow on schedule. 2-3 liters as tolerated. BP controlled.  6. Anemia - hgb 9.1. Will recheck tomorrow. Will give ESA with HD.  7. Metabolic bone disease - Hectorol on HD / Renvela binder 8. Bipolar affective d/o mixed severe w/ psychotic behavior with Ho recently [place] admit - meds 9. HO COPD-on inhaler/ rx per admit 10. Nutrition: NPO at present. Albumin 3.4 Renal diet/renal vit when able to take POs.   Rita H. Brown NP-C 08/07/2015, 9:13 AM  [facility] [phone]  Subjective: "Why did I fall down?" Patient awake, oriented to self only. No C/Os. Asking for someone to stay with her.   Objective Filed Vitals:   08/06/15 2054 08/06/15 2055 08/07/15 0545 08/07/15 0822  BP:  155/77 152/82 152/71  Pulse:  95 89 89  Temp:  98.6 F (37 C) 98.6 F (37 C) 99.4 F (37.4 C)  TempSrc:    Oral  Resp:  16 18 18   Weight: 83.326 kg (183 lb 11.2 oz)     SpO2:  100% 98% 96%   Physical Exam General: well nourished, NAD Heart: S1,S2, II/VI M. No R/G Lungs: BBS CTA A/P Abdomen: soft, nontender Extremities: No LE edema.  Dialysis Access: RUA AVF + thrill + bruit  Dialysis Orders: TueThuSat, 4 hrs 0 min, 180NRe Optiflux, BFR 400, DFR Manual 800 mL/min, EDW 83 (kg), Dialysate 2.0 K, 2.0 Ca, UFR Profile Type: None, Sodium Modeling: None, Vascular Access: RUA AV Fistula Hectoral: 18mcg IV q TTS No  Heparin!  Additional Objective Labs: Basic Metabolic Panel:  Recent Labs Lab 08/06/15 0403 08/06/15 1254 08/07/15 0524  NA 141 140 135  K 7.3* 6.1* 4.0  CL 112* 110 101  CO2 14* 21* 26  GLUCOSE 50* 84 72  BUN 77* 77* 23*  CREATININE 5.79* 5.84* 3.30*  CALCIUM 9.8 9.4 9.2   Liver Function Tests:  Recent Labs Lab 08/06/15 0403  AST 24  ALT 9*  ALKPHOS 113  BILITOT 0.7  PROT 7.4  ALBUMIN 3.4*   No results for input(s): LIPASE, AMYLASE in the last 168 hours. CBC:  Recent Labs Lab 08/06/15 0403 08/07/15 0524  WBC 4.2 4.4  HGB 11.6* 9.1*  HCT 39.6 29.4*  MCV 103.9* 97.7  PLT PLATELET CLUMPS NOTED ON SMEAR, COUNT APPEARS ADEQUATE 218   Blood Culture    Component Value Date/Time   SDES BLOOD RIGHT HAND 07/03/2015 2000   SPECREQUEST BOTTLES DRAWN AEROBIC ONLY 8CC 07/03/2015 2000   CULT NO GROWTH 5 DAYS 07/03/2015 2000   REPTSTATUS 07/08/2015 FINAL 07/03/2015 2000    Cardiac Enzymes: No results for input(s): CKTOTAL, CKMB, CKMBINDEX, TROPONINI in the last 168 hours. CBG:  Recent Labs Lab 08/06/15 1433 08/06/15 1558 08/06/15 1640 08/06/15 1758 08/07/15 0356  GLUCAP 81 83 84 72 75   Iron Studies: No results for input(s): IRON, TIBC, TRANSFERRIN, FERRITIN in the last 72 hours. @lablastinr3 @  Studies/Results: Dg Chest 1 View  08/06/2015  CLINICAL DATA:  55 year old female with altered mental status. Initial encounter. EXAM: CHEST 1 VIEW COMPARISON:  07/14/2015 and earlier. FINDINGS: Portable AP semi upright view at 1007 hours. Larger lung volumes. Stable cardiomegaly and mediastinal contours. Nearly resolved previously-seen patchy opacity at the right lung base. Curvilinear left hilar region opacity persists but most resembles atelectasis. No pneumothorax. No pleural effusion or new pulmonary opacity identified. Visualized tracheal air column is within normal limits. IMPRESSION: Improved lung volumes and right lung base ventilation since 07/14/2015 with  residual atelectasis. No new cardiopulmonary abnormality. Electronically Signed   By: Genevie Ann M.D.   On: 08/06/2015 10:29   Ct Head Wo Contrast  08/06/2015  CLINICAL DATA:  Pain following fall. History of chronic renal failure EXAM: CT HEAD WITHOUT CONTRAST CT CERVICAL SPINE WITHOUT CONTRAST TECHNIQUE: Multidetector CT imaging of the head and cervical spine was performed following the standard protocol without intravenous contrast. Multiplanar CT image reconstructions of the cervical spine were also generated. COMPARISON:  CT cervical spine July 29, 2012; head CT July 20, 2015 FINDINGS: CT HEAD FINDINGS The ventricles are normal in size and configuration. There is no intracranial mass, hemorrhage, extra-axial fluid collection, or midline shift. There is stable minimal small vessel disease in the centra semiovale bilaterally. Elsewhere gray-white compartments appear normal. No acute infarct evident. The bony calvarium appears intact. The mastoid air cells are clear. There is a retention cyst in the inferior posterior left maxillary antrum. No intraorbital lesions are evident. CT CERVICAL SPINE FINDINGS There is no demonstrable fracture or spondylolisthesis. Prevertebral soft tissues and predental space regions are within normal limits. There is moderate disc space narrowing at C5-6 and C6-7 with uncovertebral spurring at C5-6 and C7 bilaterally. There are anterior and posterior osteophytes at C5 and C6. There is facet hypertrophy at multiple levels bilaterally. Exit foraminal narrowing is most notable at C5-6 and C6-7 bilaterally. There is no frank disc extrusion or high-grade stenosis. There are foci of carotid artery calcification bilaterally. IMPRESSION: CT head: Minimal periventricular small vessel disease. No intracranial mass, hemorrhage, or extra-axial fluid collection. No acute infarct. Retention cyst inferior left maxillary antrum. CT cervical spine: No demonstrable fracture or spondylolisthesis.  Osteoarthritic change at several levels, most notably at C5-6 and C6-7, progressed slightly since the 2014 study. There are foci of carotid artery calcification bilaterally. Electronically Signed   By: Lowella Grip III M.D.   On: 08/06/2015 10:22   Ct Cervical Spine Wo Contrast  08/06/2015  CLINICAL DATA:  Pain following fall. History of chronic renal failure EXAM: CT HEAD WITHOUT CONTRAST CT CERVICAL SPINE WITHOUT CONTRAST TECHNIQUE: Multidetector CT imaging of the head and cervical spine was performed following the standard protocol without intravenous contrast. Multiplanar CT image reconstructions of the cervical spine were also generated. COMPARISON:  CT cervical spine July 29, 2012; head CT July 20, 2015 FINDINGS: CT HEAD FINDINGS The ventricles are normal in size and configuration. There is no intracranial mass, hemorrhage, extra-axial fluid collection, or midline shift. There is stable minimal small vessel disease in the centra semiovale bilaterally. Elsewhere gray-white compartments appear normal. No acute infarct evident. The bony calvarium appears intact. The mastoid air cells are clear. There is a retention cyst in the inferior posterior left maxillary antrum. No intraorbital lesions are evident. CT CERVICAL SPINE FINDINGS There is no demonstrable fracture or spondylolisthesis. Prevertebral soft tissues and predental space regions are within normal limits. There is moderate disc space narrowing at C5-6 and  C6-7 with uncovertebral spurring at C5-6 and C7 bilaterally. There are anterior and posterior osteophytes at C5 and C6. There is facet hypertrophy at multiple levels bilaterally. Exit foraminal narrowing is most notable at C5-6 and C6-7 bilaterally. There is no frank disc extrusion or high-grade stenosis. There are foci of carotid artery calcification bilaterally. IMPRESSION: CT head: Minimal periventricular small vessel disease. No intracranial mass, hemorrhage, or extra-axial fluid  collection. No acute infarct. Retention cyst inferior left maxillary antrum. CT cervical spine: No demonstrable fracture or spondylolisthesis. Osteoarthritic change at several levels, most notably at C5-6 and C6-7, progressed slightly since the 2014 study. There are foci of carotid artery calcification bilaterally. Electronically Signed   By: Lowella Grip III M.D.   On: 08/06/2015 10:22   Medications:   . heparin  5,000 Units Subcutaneous 3 times per day  . sodium chloride flush  3 mL Intravenous Q12H     I have seen and examined this patient and agree with plan as outlined by Juanell Fairly, NP-C.  Ms. Kindig is more awake today but still markedly lethargic.  She is oriented to person and place but very fatigued.  Cont with HD qTTS while she remains an inpatient.  CT scan of the head without SDH/ICH.Marland Kitchen Broadus John A Ashtin Melichar,MD 08/07/2015 1:27 PM

## 2015-08-07 NOTE — Evaluation (Signed)
Physical Therapy Evaluation Patient Details Name: Jennifer Lawson MRN: [id number] DOB: 12/16/1960 Today's Date: 08/07/2015   History of Present Illness  55 y.o. female with multiple medical problems including ESRD on HD Tue/Thur/Sat, history of bipolar disorder, hypertension. Patient presented from home to [facility] with worsening mental status changes. Also, per family report at the time of the admission, patient apparently fell but this was unwitnessed fall.  Clinical Impression  Pt admitted with above diagnosis. Pt currently with functional limitations due to the deficits listed below (see PT Problem List). Required up to mod assist for bed mobility but once upright moves generally well. Limited by confusion with difficulty processing information at times. States she cares for her 65 y.o. Grandson at home while daughter is at work. Admits to several falls recently. Pt will benefit from skilled PT to increase their independence and safety with mobility to allow discharge to the venue listed below.       Follow Up Recommendations SNF (If confusion resolves, HHPT would be appropriate)    Equipment Recommendations  None recommended by PT    Recommendations for Other Services OT consult     Precautions / Restrictions Precautions Precautions: Fall Restrictions Weight Bearing Restrictions: No      Mobility  Bed Mobility Overal bed mobility: Needs Assistance Bed Mobility: Rolling;Sidelying to Sit Rolling: Supervision Sidelying to sit: Mod assist       General bed mobility comments: Able to roll but required cues to initiate and mod assist for truncal support and to pull through PTs hand  Transfers Overall transfer level: Needs assistance Equipment used: Rolling walker (2 wheeled) Transfers: Sit to/from Stand Sit to Stand: Min guard         General transfer comment: Close guard for safety. Required a couple of attempts and to rock for momentum. VC for hand  placement. Performed x2 from bed.  Ambulation/Gait Ambulation/Gait assistance: Min guard Ambulation Distance (Feet): 75 Feet Assistive device: Rolling walker (2 wheeled) Gait Pattern/deviations: Step-through pattern;Decreased stride length;Trunk flexed Gait velocity: decreased Gait velocity interpretation: <1.8 ft/sec, indicative of risk for recurrent falls General Gait Details: VC for walker placement for proximity. No overt buckling noted although needs intermittent cues for safety and directions to continue ambulating forward, easily distracted at times.   Stairs            Wheelchair Mobility    Modified Rankin (Stroke Patients Only)       Balance Overall balance assessment: Needs assistance;History of Falls Sitting-balance support: No upper extremity supported;Feet supported Sitting balance-Leahy Scale: Good     Standing balance support: Bilateral upper extremity supported Standing balance-Leahy Scale: Poor                               Pertinent Vitals/Pain Pain Assessment: No/denies pain    Home Living Family/patient expects to be discharged to:: Unsure Living Arrangements: Children (and grandchild of 4 yr.) Available Help at Discharge: Family;Available PRN/intermittently (daughter works 2nd shift) Type of Home: House Home Access: Level entry     Home Layout: [place]: Environmental consultant - 4 wheels Additional Comments: Pt is a questionable historian, majority of information taken from previous submissions.    Prior Function Level of Independence: Independent with assistive device(s)         Comments: Pt reported she uses a rollator at home for mobility.     Hand Dominance  Extremity/Trunk Assessment   Upper Extremity Assessment: Defer to OT evaluation           Lower Extremity Assessment: Generalized weakness;Difficult to assess due to impaired cognition         Communication   Communication: No difficulties   Cognition Arousal/Alertness: Awake/alert Behavior During Therapy: WFL for tasks assessed/performed Overall Cognitive Status: Impaired/Different from baseline Area of Impairment: Orientation;Problem solving Orientation Level: Disoriented to;Time;Situation (Requires cues, knows she fell but no why she is here)           Problem Solving: Slow processing;Difficulty sequencing;Requires verbal cues      General Comments General comments (skin integrity, edema, etc.): Reports having several falls at home recently. States she cares for her grandson while her daughter is at work    Exercises        Assessment/Plan    PT Assessment Patient needs continued PT services  PT Diagnosis Difficulty walking;Abnormality of gait;Generalized weakness;Altered mental status   PT Problem List Decreased strength;Decreased activity tolerance;Decreased balance;Decreased mobility;Decreased cognition;Decreased knowledge of use of DME;Decreased safety awareness;Decreased knowledge of precautions  PT Treatment Interventions DME instruction;Gait training;Functional mobility training;Therapeutic activities;Therapeutic exercise;Balance training;Cognitive remediation;Patient/family education   PT Goals (Current goals can be found in the Care Plan section) Acute Rehab PT Goals Patient Stated Goal: None stated PT Goal Formulation: Patient unable to participate in goal setting Time For Goal Achievement: 08/21/15 Potential to Achieve Goals: Good    Frequency Min 3X/week   Barriers to discharge Decreased caregiver support daughter works 2nd shift    Co-evaluation               End of Session Equipment Utilized During Treatment: Gait belt Activity Tolerance: Patient tolerated treatment well Patient left: in chair;with call bell/phone within reach;with chair alarm set Nurse Communication: Mobility status    Functional Assessment Tool Used: clinical observation Functional Limitation: Mobility: Walking  and moving around Mobility: Walking and Moving Around Current Status [phone]): At least 20 percent but less than 40 percent impaired, limited or restricted Mobility: Walking and Moving Around Goal Status [phone]): At least 1 percent but less than 20 percent impaired, limited or restricted    Time: 1440-1500 PT Time Calculation (min) (ACUTE ONLY): 20 min   Charges:   PT Evaluation $PT Eval Low Complexity: 1 Procedure     PT G Codes:   PT G-Codes **NOT FOR INPATIENT CLASS** Functional Assessment Tool Used: clinical observation Functional Limitation: Mobility: Walking and moving around Mobility: Walking and Moving Around Current Status [id number]): At least 20 percent but less than 40 percent impaired, limited or restricted Mobility: Walking and Moving Around Goal Status [phone]): At least 1 percent but less than 20 percent impaired, limited or restricted    Ellouise Newer 08/07/2015, 4:36 PM  Camille Bal [place], [place]

## 2015-08-08 DIAGNOSIS — N186 End stage renal disease: Secondary | ICD-10-CM | POA: Diagnosis not present

## 2015-08-08 DIAGNOSIS — D638 Anemia in other chronic diseases classified elsewhere: Secondary | ICD-10-CM | POA: Diagnosis not present

## 2015-08-08 DIAGNOSIS — G9340 Encephalopathy, unspecified: Secondary | ICD-10-CM | POA: Diagnosis not present

## 2015-08-08 DIAGNOSIS — E875 Hyperkalemia: Secondary | ICD-10-CM | POA: Diagnosis not present

## 2015-08-08 DIAGNOSIS — R4182 Altered mental status, unspecified: Secondary | ICD-10-CM | POA: Diagnosis not present

## 2015-08-08 LAB — GLUCOSE, CAPILLARY
Glucose-Capillary: 112 mg/dL — ABNORMAL HIGH (ref 65–99)
Glucose-Capillary: 122 mg/dL — ABNORMAL HIGH (ref 65–99)
Glucose-Capillary: 63 mg/dL — ABNORMAL LOW (ref 65–99)
Glucose-Capillary: 65 mg/dL (ref 65–99)
Glucose-Capillary: 66 mg/dL (ref 65–99)
Glucose-Capillary: 68 mg/dL (ref 65–99)
Glucose-Capillary: 99 mg/dL (ref 65–99)

## 2015-08-08 LAB — CBC
HCT: 30.1 % — ABNORMAL LOW (ref 36.0–46.0)
Hemoglobin: 8.9 g/dL — ABNORMAL LOW (ref 12.0–15.0)
MCH: 28.9 pg (ref 26.0–34.0)
MCHC: 29.6 g/dL — ABNORMAL LOW (ref 30.0–36.0)
MCV: 97.7 fL (ref 78.0–100.0)
Platelets: 214 10*3/uL (ref 150–400)
RBC: 3.08 MIL/uL — ABNORMAL LOW (ref 3.87–5.11)
RDW: 19.4 % — ABNORMAL HIGH (ref 11.5–15.5)
WBC: 3.1 10*3/uL — ABNORMAL LOW (ref 4.0–10.5)

## 2015-08-08 LAB — RENAL FUNCTION PANEL
Albumin: 2.5 g/dL — ABNORMAL LOW (ref 3.5–5.0)
Anion gap: 10 (ref 5–15)
BUN: 33 mg/dL — ABNORMAL HIGH (ref 6–20)
CO2: 25 mmol/L (ref 22–32)
Calcium: 8.7 mg/dL — ABNORMAL LOW (ref 8.9–10.3)
Chloride: 100 mmol/L — ABNORMAL LOW (ref 101–111)
Creatinine, Ser: 4.49 mg/dL — ABNORMAL HIGH (ref 0.44–1.00)
GFR calc Af Amer: 12 mL/min — ABNORMAL LOW (ref >60)
GFR calc non Af Amer: 10 mL/min — ABNORMAL LOW (ref >60)
Glucose, Bld: 112 mg/dL — ABNORMAL HIGH (ref 65–99)
Phosphorus: 6.4 mg/dL — ABNORMAL HIGH (ref 2.5–4.6)
Potassium: 4.1 mmol/L (ref 3.5–5.1)
Sodium: 135 mmol/L (ref 135–145)

## 2015-08-08 MED ORDER — SODIUM CHLORIDE 0.9 % IV SOLN: 100.0000 mL | INTRAVENOUS | Status: DC | PRN

## 2015-08-08 MED ORDER — LORAZEPAM 0.5 MG PO TABS
0.5000 mg | ORAL_TABLET | Freq: Once | ORAL | Status: AC
  Administered 2015-08-08: 0.5 mg via ORAL
  Filled 2015-08-08: qty 1

## 2015-08-08 MED ORDER — DOXERCALCIFEROL 4 MCG/2ML IV SOLN
INTRAVENOUS | Status: AC
  Filled 2015-08-08: qty 2

## 2015-08-08 MED ORDER — PENTAFLUOROPROP-TETRAFLUOROETH EX AERO: 1.0000 "application " | INHALATION_SPRAY | CUTANEOUS | Status: DC | PRN

## 2015-08-08 MED ORDER — ACETAMINOPHEN 325 MG PO TABS
650.0000 mg | ORAL_TABLET | Freq: Four times a day (QID) | ORAL | Status: DC | PRN
  Administered 2015-08-08 (×2): 650 mg via ORAL
  Filled 2015-08-08 (×2): qty 2

## 2015-08-08 MED ORDER — ACETAMINOPHEN 650 MG RE SUPP
650.0000 mg | Freq: Four times a day (QID) | RECTAL | Status: DC | PRN
  Administered 2015-08-08: 650 mg via RECTAL
  Filled 2015-08-08: qty 1

## 2015-08-08 MED ORDER — ACETAMINOPHEN 650 MG RE SUPP: 650.0000 mg | Freq: Four times a day (QID) | RECTAL | Status: DC | PRN

## 2015-08-08 MED ORDER — LIDOCAINE HCL (PF) 1 % IJ SOLN: 5.0000 mL | INTRAMUSCULAR | Status: DC | PRN

## 2015-08-08 MED ORDER — LIDOCAINE-PRILOCAINE 2.5-2.5 % EX CREA: 1.0000 "application " | TOPICAL_CREAM | CUTANEOUS | Status: DC | PRN

## 2015-08-08 MED ORDER — ACETAMINOPHEN 650 MG RE SUPP: 650.0000 mg | RECTAL | Status: DC | PRN

## 2015-08-08 MED ORDER — DARBEPOETIN ALFA 40 MCG/0.4ML IJ SOSY
PREFILLED_SYRINGE | INTRAMUSCULAR | Status: AC
  Filled 2015-08-08: qty 0.4

## 2015-08-08 MED ORDER — LORAZEPAM 2 MG/ML IJ SOLN
0.5000 mg | Freq: Once | INTRAMUSCULAR | Status: AC
  Administered 2015-08-08: 0.5 mg via INTRAVENOUS
  Filled 2015-08-08: qty 1

## 2015-08-08 NOTE — Procedures (Signed)
Patient was seen on dialysis and the procedure was supervised. BFR 400 Via RAVF BP is 121/81.  Patient appears to be tolerating treatment well but did ask when she would be able to eat.

## 2015-08-08 NOTE — Progress Notes (Signed)
Patient ID: Jennifer Lawson, female   DOB: December 15, 1960, 55 y.o.   MRN: [id number] [facility] PROGRESS NOTE  Jennifer Lawson [id number] DOB: [birth year] DOA: 08/06/2015 PCP: [facility]  Brief narrative:    54 y.o. female with multiple medical problems including ESRD on HD Tue/Thur/Sat, history of bipolar disorder, hypertension. Patient presented from home to [facility] with worsening mental status changes. Also, per family report at the time of the admission, patient apparently fell but this was unwitnessed fall.  Patient was hemodynamically stable on the admission. Blood work was significant for potassium of 7.3, CO2 14 and creatinine 5.79. CT head and cervical spine showed minimal periventricular small vessel disease otherwise no acute intracranial findings. She was seen by renal in consultation. She had HD 3/30 and one 4/1.  Assessment/Plan:    Principal problem: Acute encephalopathy -Likely from electrolyte derangement secondary to missed dialysis - Mental status better this am   Active problems: Hyperkalemia - Secondary to missed hemodialysis - Underwent emergent dialysis 08/06/2015 - Potassium normalized   End-stage renal disease on hemodialysis - Patient is receiving hemodialysis Tuesday, Thursday and Saturday - Per renal   Metabolic bone disease - On Hectorol with HD, Renvela binder  Anemia of chronic renal disease - Hemoglobin stable, 9.1   DVT Prophylaxis  - Heparin subQ ordered   Code Status: Full.  Family Communication:  plan of care discussed with the patient Disposition Plan: likely by 08/10/2015  IV access:  Peripheral IV  Procedures and diagnostic studies:    Dg Chest 1 View 08/06/2015  Improved lung volumes and right lung base ventilation since 07/14/2015 with residual atelectasis. No new cardiopulmonary abnormality.   Ct Head Wo Contrast 08/06/2015   CT head: Minimal periventricular small vessel disease. No intracranial mass,  hemorrhage, or extra-axial fluid collection. No acute infarct. Retention cyst inferior left maxillary antrum. CT cervical spine: No demonstrable fracture or spondylolisthesis. Osteoarthritic change at several levels, most notably at C5-6 and C6-7, progressed slightly since the 2014 study. There are foci of carotid artery calcification bilaterally.   Ct Cervical Spine Wo Contrast 08/06/2015   CT head: Minimal periventricular small vessel disease. No intracranial mass, hemorrhage, or extra-axial fluid collection. No acute infarct. Retention cyst inferior left maxillary antrum. CT cervical spine: No demonstrable fracture or spondylolisthesis. Osteoarthritic change at several levels, most notably at C5-6 and C6-7, progressed slightly since the 2014 study. There are foci of carotid artery calcification bilaterally.   Medical Consultants:  Nephrology  Other Consultants:  None   IAnti-Infectives:   None    Leisa Lenz, MD  [facility] Pager [phone]  Time spent in minutes: 15 minutes  If 7PM-7AM, please contact night-coverage [email or web address] Password [email or web address] 08/08/2015, 2:44 PM     HPI/Subjective: No acute overnight events. No resp distress.   Objective: Filed Vitals:   08/08/15 1157 08/08/15 1230 08/08/15 1300 08/08/15 1407  BP: 99/66 105/63 103/61 108/67  Pulse: 92 94 89 91  Temp:   98 F (36.7 C) 98.1 F (36.7 C)  TempSrc:   Oral Oral  Resp: 23 18 22 18   Height:      Weight:   79.7 kg (175 lb 11.3 oz)   SpO2:   99% 98%    Intake/Output Summary (Last 24 hours) at 08/08/15 1444 Last data filed at 08/08/15 1300  Gross per 24 hour  Intake      0 ml  Output   3300 ml  Net  -3300 ml    Exam:  General:  Pt is alert, no distress  Cardiovascular: RRR, S1/S2 (+)  Respiratory: No wheezing, no crackles, no rhonchi  Abdomen: (+) BS, non tender   Extremities: No swelling, palpable pulses   Neuro: Nonfocal  Data Reviewed: Basic Metabolic Panel:  Recent Labs Lab  08/06/15 0403 08/06/15 1254 08/07/15 0524 08/08/15 0950  NA 141 140 135 135  K 7.3* 6.1* 4.0 4.1  CL 112* 110 101 100*  CO2 14* 21* 26 25  GLUCOSE 50* 84 72 112*  BUN 77* 77* 23* 33*  CREATININE 5.79* 5.84* 3.30* 4.49*  CALCIUM 9.8 9.4 9.2 8.7*  PHOS  --   --   --  6.4*   Liver Function Tests:  Recent Labs Lab 08/06/15 0403 08/08/15 0950  AST 24  --   ALT 9*  --   ALKPHOS 113  --   BILITOT 0.7  --   PROT 7.4  --   ALBUMIN 3.4* 2.5*   No results for input(s): LIPASE, AMYLASE in the last 168 hours. No results for input(s): AMMONIA in the last 168 hours. CBC:  Recent Labs Lab 08/06/15 0403 08/07/15 0524 08/08/15 0951  WBC 4.2 4.4 3.1*  HGB 11.6* 9.1* 8.9*  HCT 39.6 29.4* 30.1*  MCV 103.9* 97.7 97.7  PLT PLATELET CLUMPS NOTED ON SMEAR, COUNT APPEARS ADEQUATE 218 214   Cardiac Enzymes: No results for input(s): CKTOTAL, CKMB, CKMBINDEX, TROPONINI in the last 168 hours. BNP: Invalid input(s): POCBNP CBG:  Recent Labs Lab 08/07/15 2342 08/08/15 0111 08/08/15 0800 08/08/15 0852 08/08/15 1404  GLUCAP 65 99 63* 112* 66    Recent Results (from the past 240 hour(s))  Blood culture (routine x 2)     Status: None (Preliminary result)   Collection Time: 08/06/15  9:03 AM  Result Value Ref Range Status   Specimen Description BLOOD LEFT HAND  Final   Special Requests IN PEDIATRIC BOTTLE 1CC  Final   Culture NO GROWTH 2 DAYS  Final   Report Status PENDING  Incomplete  Blood culture (routine x 2)     Status: None (Preliminary result)   Collection Time: 08/06/15  9:30 AM  Result Value Ref Range Status   Specimen Description BLOOD LEFT HAND  Final   Special Requests BOTTLES DRAWN AEROBIC AND ANAEROBIC 5CC  Final   Culture NO GROWTH 2 DAYS  Final   Report Status PENDING  Incomplete  MRSA PCR Screening     Status: None   Collection Time: 08/06/15  6:25 PM  Result Value Ref Range Status   MRSA by PCR NEGATIVE NEGATIVE Final    Comment:        The GeneXpert MRSA  Assay (FDA approved for NASAL specimens only), is one component of a comprehensive MRSA colonization surveillance program. It is not intended to diagnose MRSA infection nor to guide or monitor treatment for MRSA infections.      Scheduled Meds: . Darbepoetin Alfa      . darbepoetin (ARANESP) injection - DIALYSIS  40 mcg Intravenous Q Sat-HD  . doxercalciferol  4 mcg Intravenous Q T,Th,Sa-HD  . heparin  5,000 Units Subcutaneous 3 times per day  . sodium chloride flush  3 mL Intravenous Q12H   Continuous Infusions:

## 2015-08-08 NOTE — Progress Notes (Signed)
CBG at 2342 was 65.  Gave patient 12.5 mg IV Dextrose.  At 0111, CBG was 99.  Will continue to monitor patient.  [employer] RN-BC, WTA.

## 2015-08-08 NOTE — Progress Notes (Signed)
During the night, the patient became increasingly anxious.  She is very upset about her NPO diet.  She consistently asks the same questions repeatedly.  She is also c/o headache.  Called and made [facility] aware.  Gave Tylenol PR and IV lorazepam 0.5 mg.  Patient is currently asleep.  Will continue to monitor patient.  [employer] RN-BC, WTA.

## 2015-08-09 DIAGNOSIS — J4520 Mild intermittent asthma, uncomplicated: Secondary | ICD-10-CM

## 2015-08-09 DIAGNOSIS — R5381 Other malaise: Secondary | ICD-10-CM | POA: Insufficient documentation

## 2015-08-09 DIAGNOSIS — E875 Hyperkalemia: Secondary | ICD-10-CM | POA: Diagnosis not present

## 2015-08-09 DIAGNOSIS — R4182 Altered mental status, unspecified: Secondary | ICD-10-CM

## 2015-08-09 DIAGNOSIS — F319 Bipolar disorder, unspecified: Secondary | ICD-10-CM

## 2015-08-09 DIAGNOSIS — W19XXXD Unspecified fall, subsequent encounter: Secondary | ICD-10-CM

## 2015-08-09 DIAGNOSIS — G9340 Encephalopathy, unspecified: Secondary | ICD-10-CM

## 2015-08-09 DIAGNOSIS — Z992 Dependence on renal dialysis: Secondary | ICD-10-CM

## 2015-08-09 DIAGNOSIS — N186 End stage renal disease: Secondary | ICD-10-CM

## 2015-08-09 LAB — GLUCOSE, CAPILLARY
Glucose-Capillary: 22 mg/dL — CL (ref 65–99)
Glucose-Capillary: 86 mg/dL (ref 65–99)
Glucose-Capillary: 87 mg/dL (ref 65–99)

## 2015-08-09 MED ORDER — GLUCAGON HCL RDNA (DIAGNOSTIC) 1 MG IJ SOLR
INTRAMUSCULAR | Status: AC
  Filled 2015-08-09: qty 1

## 2015-08-09 MED ORDER — DARBEPOETIN ALFA 40 MCG/0.4ML IJ SOSY: 40.0000 ug | PREFILLED_SYRINGE | INTRAMUSCULAR | Status: AC

## 2015-08-09 MED ORDER — METOPROLOL TARTRATE 25 MG PO TABS: 12.5000 mg | ORAL_TABLET | Freq: Two times a day (BID) | ORAL | Status: AC

## 2015-08-09 NOTE — Progress Notes (Signed)
Patient discharge teaching given, including activity, diet, follow-up appoints, and medications. Patient verbalized understanding of all discharge instructions. IV access was d/c'd. Vitals are stable. Skin is intact except as charted in most recent assessments. Pt to be escorted out by NT, to be driven home by family.  Donetta Isaza, MBA, BSN, RN 
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
## 2015-08-11 LAB — CULTURE, BLOOD (ROUTINE X 2)
Culture: NO GROWTH
Culture: NO GROWTH

## 2015-10-27 ENCOUNTER — Emergency Department (HOSPITAL_COMMUNITY)
Admission: EM | Admit: 2015-10-27 | Discharge: 2015-10-28 | Disposition: A | Discharge status: Other Acute Inpatient Hospital | Payer: Medicare Other | Attending: Emergency Medicine | Admitting: Emergency Medicine

## 2015-10-27 ENCOUNTER — Other Ambulatory Visit: Payer: Self-pay

## 2015-10-27 ENCOUNTER — Encounter (HOSPITAL_COMMUNITY): Payer: Self-pay | Admitting: *Deleted

## 2015-10-27 DIAGNOSIS — I120 Hypertensive chronic kidney disease with stage 5 chronic kidney disease or end stage renal disease: Secondary | ICD-10-CM | POA: Insufficient documentation

## 2015-10-27 DIAGNOSIS — Z992 Dependence on renal dialysis: Secondary | ICD-10-CM | POA: Insufficient documentation

## 2015-10-27 DIAGNOSIS — E1122 Type 2 diabetes mellitus with diabetic chronic kidney disease: Secondary | ICD-10-CM | POA: Diagnosis not present

## 2015-10-27 DIAGNOSIS — F17210 Nicotine dependence, cigarettes, uncomplicated: Secondary | ICD-10-CM | POA: Diagnosis not present

## 2015-10-27 DIAGNOSIS — Z96651 Presence of right artificial knee joint: Secondary | ICD-10-CM | POA: Diagnosis not present

## 2015-10-27 DIAGNOSIS — F312 Bipolar disorder, current episode manic severe with psychotic features: Secondary | ICD-10-CM | POA: Diagnosis not present

## 2015-10-27 DIAGNOSIS — J449 Chronic obstructive pulmonary disease, unspecified: Secondary | ICD-10-CM | POA: Insufficient documentation

## 2015-10-27 DIAGNOSIS — D631 Anemia in chronic kidney disease: Secondary | ICD-10-CM | POA: Insufficient documentation

## 2015-10-27 DIAGNOSIS — N189 Chronic kidney disease, unspecified: Secondary | ICD-10-CM

## 2015-10-27 DIAGNOSIS — Z79899 Other long term (current) drug therapy: Secondary | ICD-10-CM | POA: Diagnosis not present

## 2015-10-27 DIAGNOSIS — N186 End stage renal disease: Secondary | ICD-10-CM | POA: Diagnosis not present

## 2015-10-27 LAB — CBC WITH DIFFERENTIAL/PLATELET
Basophils Absolute: 0 10*3/uL (ref 0.0–0.1)
Basophils Relative: 1 %
Eosinophils Absolute: 0.1 10*3/uL (ref 0.0–0.7)
Eosinophils Relative: 2 %
HCT: 27.4 % — ABNORMAL LOW (ref 36.0–46.0)
Hemoglobin: 8.4 g/dL — ABNORMAL LOW (ref 12.0–15.0)
Lymphocytes Relative: 28 %
Lymphs Abs: 1.3 10*3/uL (ref 0.7–4.0)
MCH: 29.6 pg (ref 26.0–34.0)
MCHC: 30.7 g/dL (ref 30.0–36.0)
MCV: 96.5 fL (ref 78.0–100.0)
Monocytes Absolute: 0.6 10*3/uL (ref 0.1–1.0)
Monocytes Relative: 12 %
Neutro Abs: 2.8 10*3/uL (ref 1.7–7.7)
Neutrophils Relative %: 57 %
Platelets: 279 10*3/uL (ref 150–400)
RBC: 2.84 MIL/uL — ABNORMAL LOW (ref 3.87–5.11)
RDW: 18.2 % — ABNORMAL HIGH (ref 11.5–15.5)
WBC: 4.8 10*3/uL (ref 4.0–10.5)

## 2015-10-27 LAB — COMPREHENSIVE METABOLIC PANEL
ALT: 10 U/L — ABNORMAL LOW (ref 14–54)
AST: 17 U/L (ref 15–41)
Albumin: 3.3 g/dL — ABNORMAL LOW (ref 3.5–5.0)
Alkaline Phosphatase: 95 U/L (ref 38–126)
Anion gap: 12 (ref 5–15)
BUN: 35 mg/dL — ABNORMAL HIGH (ref 6–20)
CO2: 23 mmol/L (ref 22–32)
Calcium: 9.4 mg/dL (ref 8.9–10.3)
Chloride: 105 mmol/L (ref 101–111)
Creatinine, Ser: 5.66 mg/dL — ABNORMAL HIGH (ref 0.44–1.00)
GFR calc Af Amer: 9 mL/min — ABNORMAL LOW (ref >60)
GFR calc non Af Amer: 8 mL/min — ABNORMAL LOW (ref >60)
Glucose, Bld: 67 mg/dL (ref 65–99)
Potassium: 4 mmol/L (ref 3.5–5.1)
Sodium: 140 mmol/L (ref 135–145)
Total Bilirubin: 0.4 mg/dL (ref 0.3–1.2)
Total Protein: 6.6 g/dL (ref 6.5–8.1)

## 2015-10-27 LAB — VALPROIC ACID LEVEL: Valproic Acid Lvl: 61 ug/mL (ref 50.0–100.0)

## 2015-10-27 LAB — ETHANOL: Alcohol, Ethyl (B): <5 mg/dL (ref <5)

## 2015-10-27 MED ORDER — ONDANSETRON HCL 4 MG PO TABS: 4.0000 mg | ORAL_TABLET | Freq: Three times a day (TID) | ORAL | Status: DC | PRN

## 2015-10-27 MED ORDER — METOPROLOL TARTRATE 25 MG PO TABS
12.5000 mg | ORAL_TABLET | Freq: Two times a day (BID) | ORAL | Status: DC
  Administered 2015-10-28: 12.5 mg via ORAL
  Filled 2015-10-27: qty 1

## 2015-10-27 MED ORDER — SEVELAMER CARBONATE 800 MG PO TABS
1600.0000 mg | ORAL_TABLET | Freq: Three times a day (TID) | ORAL | Status: DC
  Filled 2015-10-27 (×3): qty 2

## 2015-10-27 MED ORDER — DOCUSATE SODIUM 100 MG PO CAPS
100.0000 mg | ORAL_CAPSULE | Freq: Two times a day (BID) | ORAL | Status: DC
  Administered 2015-10-28: 100 mg via ORAL
  Filled 2015-10-27: qty 1

## 2015-10-27 MED ORDER — ZIPRASIDONE HCL 20 MG PO CAPS
120.0000 mg | ORAL_CAPSULE | Freq: Every day | ORAL | Status: DC
  Administered 2015-10-27: 120 mg via ORAL
  Filled 2015-10-27: qty 6

## 2015-10-27 MED ORDER — DSS 100 MG PO CAPS: 100.0000 mg | ORAL_CAPSULE | Freq: Two times a day (BID) | ORAL | Status: DC

## 2015-10-27 MED ORDER — ALBUTEROL SULFATE (2.5 MG/3ML) 0.083% IN NEBU: 2.5000 mg | INHALATION_SOLUTION | Freq: Four times a day (QID) | RESPIRATORY_TRACT | Status: DC | PRN

## 2015-10-27 MED ORDER — CINACALCET HCL 30 MG PO TABS
60.0000 mg | ORAL_TABLET | Freq: Every day | ORAL | Status: DC
  Filled 2015-10-27: qty 2

## 2015-10-27 MED ORDER — SIMVASTATIN 10 MG PO TABS
10.0000 mg | ORAL_TABLET | Freq: Every day | ORAL | Status: DC
  Filled 2015-10-27 (×2): qty 1

## 2015-10-27 MED ORDER — MOMETASONE FURO-FORMOTEROL FUM 100-5 MCG/ACT IN AERO
2.0000 | INHALATION_SPRAY | Freq: Two times a day (BID) | RESPIRATORY_TRACT | Status: DC
  Administered 2015-10-28: 2 via RESPIRATORY_TRACT
  Filled 2015-10-27: qty 8.8

## 2015-10-27 MED ORDER — RENA-VITE PO TABS
1.0000 | ORAL_TABLET | Freq: Every day | ORAL | Status: DC
Start: 2015-10-27 — End: 2015-10-28

## 2015-10-27 MED ORDER — ALBUTEROL SULFATE HFA 108 (90 BASE) MCG/ACT IN AERS: 2.0000 | INHALATION_SPRAY | Freq: Four times a day (QID) | RESPIRATORY_TRACT | Status: DC | PRN

## 2015-10-27 MED ORDER — PANTOPRAZOLE SODIUM 40 MG PO TBEC
40.0000 mg | DELAYED_RELEASE_TABLET | Freq: Every day | ORAL | Status: DC
  Administered 2015-10-28: 40 mg via ORAL
  Filled 2015-10-27: qty 1

## 2015-10-27 MED ORDER — DARBEPOETIN ALFA 40 MCG/0.4ML IJ SOSY: 40.0000 ug | PREFILLED_SYRINGE | INTRAMUSCULAR | Status: DC

## 2015-10-27 MED ORDER — NICOTINE 21 MG/24HR TD PT24
21.0000 mg | MEDICATED_PATCH | Freq: Every day | TRANSDERMAL | Status: DC
  Administered 2015-10-28: 21 mg via TRANSDERMAL
  Filled 2015-10-27: qty 1

## 2015-10-27 MED ORDER — LORAZEPAM 2 MG/ML IJ SOLN
2.0000 mg | Freq: Once | INTRAMUSCULAR | Status: AC
  Administered 2015-10-27: 2 mg via INTRAVENOUS

## 2015-10-27 MED ORDER — DIVALPROEX SODIUM ER 500 MG PO TB24: 2000.0000 mg | ORAL_TABLET | Freq: Every day | ORAL | Status: DC

## 2015-10-27 MED ORDER — LORAZEPAM 1 MG PO TABS: 1.0000 mg | ORAL_TABLET | Freq: Every day | ORAL | Status: DC
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
## 2015-10-27 NOTE — ED Notes (Signed)
[place] sandwich and water giver per Dr. Gilford Raid
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
## 2015-10-27 NOTE — Progress Notes (Signed)
Attempted to Secure placement at the following facilities:   Faxed To:  [facility]   Chesley Noon, MSW, Darlyn Read [facility] Triage Specialist [phone]

## 2015-10-27 NOTE — ED Notes (Signed)
Dinner tray, regular diet, no sharps ordered 

## 2015-10-27 NOTE — ED Notes (Signed)
Attempted to get urine from patient , placed on bedpan told patient not put anything in bedpan , patient put tissue in bedpain

## 2015-10-27 NOTE — ED Notes (Signed)
Pt given coffee and hot meal

## 2015-10-27 NOTE — ED Notes (Signed)
pysch tele in progress with patient at present.

## 2015-10-27 NOTE — ED Notes (Signed)
Pt used bedside commode with minimal assistance 

## 2015-10-28 ENCOUNTER — Encounter: Payer: Self-pay | Admitting: Psychiatry

## 2015-10-28 ENCOUNTER — Inpatient Hospital Stay
Admission: EM | Admit: 2015-10-28 | Discharge: 2015-11-05 | DRG: 885 | Disposition: A | Discharge status: Home / Self Care | Payer: Medicaid Other | Source: Intra-hospital | Attending: Psychiatry | Admitting: Psychiatry

## 2015-10-28 DIAGNOSIS — K5900 Constipation, unspecified: Secondary | ICD-10-CM | POA: Diagnosis present

## 2015-10-28 DIAGNOSIS — I120 Hypertensive chronic kidney disease with stage 5 chronic kidney disease or end stage renal disease: Secondary | ICD-10-CM | POA: Diagnosis present

## 2015-10-28 DIAGNOSIS — F312 Bipolar disorder, current episode manic severe with psychotic features: Principal | ICD-10-CM | POA: Diagnosis present

## 2015-10-28 DIAGNOSIS — Z79899 Other long term (current) drug therapy: Secondary | ICD-10-CM | POA: Diagnosis not present

## 2015-10-28 DIAGNOSIS — Z992 Dependence on renal dialysis: Secondary | ICD-10-CM | POA: Diagnosis not present

## 2015-10-28 DIAGNOSIS — F17210 Nicotine dependence, cigarettes, uncomplicated: Secondary | ICD-10-CM | POA: Diagnosis present

## 2015-10-28 DIAGNOSIS — J449 Chronic obstructive pulmonary disease, unspecified: Secondary | ICD-10-CM | POA: Diagnosis present

## 2015-10-28 DIAGNOSIS — Z96651 Presence of right artificial knee joint: Secondary | ICD-10-CM | POA: Diagnosis present

## 2015-10-28 DIAGNOSIS — G8929 Other chronic pain: Secondary | ICD-10-CM | POA: Diagnosis present

## 2015-10-28 DIAGNOSIS — Z833 Family history of diabetes mellitus: Secondary | ICD-10-CM | POA: Diagnosis not present

## 2015-10-28 DIAGNOSIS — Z9119 Patient's noncompliance with other medical treatment and regimen: Secondary | ICD-10-CM

## 2015-10-28 DIAGNOSIS — Z888 Allergy status to other drugs, medicaments and biological substances status: Secondary | ICD-10-CM

## 2015-10-28 DIAGNOSIS — G4700 Insomnia, unspecified: Secondary | ICD-10-CM | POA: Diagnosis present

## 2015-10-28 DIAGNOSIS — K219 Gastro-esophageal reflux disease without esophagitis: Secondary | ICD-10-CM | POA: Diagnosis present

## 2015-10-28 DIAGNOSIS — Z8249 Family history of ischemic heart disease and other diseases of the circulatory system: Secondary | ICD-10-CM

## 2015-10-28 DIAGNOSIS — E8881 Metabolic syndrome: Secondary | ICD-10-CM | POA: Diagnosis present

## 2015-10-28 DIAGNOSIS — E892 Postprocedural hypoparathyroidism: Secondary | ICD-10-CM | POA: Diagnosis present

## 2015-10-28 DIAGNOSIS — N186 End stage renal disease: Secondary | ICD-10-CM | POA: Diagnosis present

## 2015-10-28 DIAGNOSIS — Z915 Personal history of self-harm: Secondary | ICD-10-CM

## 2015-10-28 DIAGNOSIS — D631 Anemia in chronic kidney disease: Secondary | ICD-10-CM | POA: Diagnosis present

## 2015-10-28 DIAGNOSIS — Z886 Allergy status to analgesic agent status: Secondary | ICD-10-CM

## 2015-10-28 DIAGNOSIS — F17200 Nicotine dependence, unspecified, uncomplicated: Secondary | ICD-10-CM | POA: Diagnosis present

## 2015-10-28 DIAGNOSIS — E1122 Type 2 diabetes mellitus with diabetic chronic kidney disease: Secondary | ICD-10-CM | POA: Diagnosis present

## 2015-10-28 DIAGNOSIS — E785 Hyperlipidemia, unspecified: Secondary | ICD-10-CM | POA: Diagnosis present

## 2015-10-28 LAB — RAPID URINE DRUG SCREEN, HOSP PERFORMED
Amphetamines: NOT DETECTED
Barbiturates: NOT DETECTED
Benzodiazepines: POSITIVE — AB
Cocaine: NOT DETECTED
Opiates: NOT DETECTED
Tetrahydrocannabinol: NOT DETECTED

## 2015-10-28 LAB — URINALYSIS, ROUTINE W REFLEX MICROSCOPIC
Bilirubin Urine: NEGATIVE
Glucose, UA: NEGATIVE mg/dL
Hgb urine dipstick: NEGATIVE
Ketones, ur: NEGATIVE mg/dL
Leukocytes, UA: NEGATIVE
Nitrite: NEGATIVE
Protein, ur: >300 mg/dL — AB
Specific Gravity, Urine: 1.01 (ref 1.005–1.030)
pH: 7 (ref 5.0–8.0)

## 2015-10-28 LAB — URINE MICROSCOPIC-ADD ON
Bacteria, UA: NONE SEEN
RBC / HPF: NONE SEEN RBC/hpf (ref 0–5)

## 2015-10-28 MED ORDER — SEVELAMER CARBONATE 800 MG PO TABS
1600.0000 mg | ORAL_TABLET | Freq: Three times a day (TID) | ORAL | Status: DC
  Administered 2015-10-29 – 2015-11-05 (×16): 1600 mg via ORAL
  Filled 2015-10-28 (×21): qty 2

## 2015-10-28 MED ORDER — SIMVASTATIN 10 MG PO TABS
10.0000 mg | ORAL_TABLET | Freq: Every day | ORAL | Status: DC
  Administered 2015-10-28 – 2015-11-04 (×7): 10 mg via ORAL
  Filled 2015-10-28 (×8): qty 1

## 2015-10-28 MED ORDER — DOXEPIN HCL 50 MG PO CAPS
50.0000 mg | ORAL_CAPSULE | Freq: Every day | ORAL | Status: DC
  Administered 2015-10-28 – 2015-11-03 (×7): 50 mg via ORAL
  Filled 2015-10-28 (×8): qty 1

## 2015-10-28 MED ORDER — LORAZEPAM 1 MG PO TABS
1.0000 mg | ORAL_TABLET | Freq: Once | ORAL | Status: AC
  Administered 2015-10-28: 1 mg via ORAL
  Filled 2015-10-28: qty 1

## 2015-10-28 MED ORDER — ALUM & MAG HYDROXIDE-SIMETH 200-200-20 MG/5ML PO SUSP: 30.0000 mL | ORAL | Status: DC | PRN

## 2015-10-28 MED ORDER — METOPROLOL TARTRATE 25 MG PO TABS
25.0000 mg | ORAL_TABLET | Freq: Two times a day (BID) | ORAL | Status: DC
  Administered 2015-10-28 – 2015-10-29 (×3): 25 mg via ORAL
  Filled 2015-10-28 (×4): qty 1

## 2015-10-28 MED ORDER — DIVALPROEX SODIUM 250 MG PO DR TAB
250.0000 mg | DELAYED_RELEASE_TABLET | Freq: Three times a day (TID) | ORAL | Status: DC
  Administered 2015-10-28 – 2015-10-30 (×4): 250 mg via ORAL
  Filled 2015-10-28 (×4): qty 1

## 2015-10-28 MED ORDER — ACETAMINOPHEN 325 MG PO TABS
650.0000 mg | ORAL_TABLET | Freq: Four times a day (QID) | ORAL | Status: DC | PRN
  Administered 2015-10-28 – 2015-11-03 (×12): 650 mg via ORAL
  Filled 2015-10-28 (×12): qty 2

## 2015-10-28 MED ORDER — PANTOPRAZOLE SODIUM 40 MG PO TBEC
40.0000 mg | DELAYED_RELEASE_TABLET | Freq: Every day | ORAL | Status: DC
  Administered 2015-10-28 – 2015-11-05 (×8): 40 mg via ORAL
  Filled 2015-10-28 (×9): qty 1

## 2015-10-28 MED ORDER — ZIPRASIDONE HCL 40 MG PO CAPS
120.0000 mg | ORAL_CAPSULE | Freq: Every day | ORAL | Status: DC
  Administered 2015-10-28 – 2015-11-04 (×8): 120 mg via ORAL
  Filled 2015-10-28: qty 6
  Filled 2015-10-28: qty 3
  Filled 2015-10-28: qty 6
  Filled 2015-10-28: qty 3
  Filled 2015-10-28: qty 2
  Filled 2015-10-28: qty 6
  Filled 2015-10-28 (×2): qty 3

## 2015-10-28 MED ORDER — MOMETASONE FURO-FORMOTEROL FUM 100-5 MCG/ACT IN AERO
2.0000 | INHALATION_SPRAY | Freq: Two times a day (BID) | RESPIRATORY_TRACT | Status: DC
  Administered 2015-10-28 – 2015-11-04 (×13): 2 via RESPIRATORY_TRACT
  Filled 2015-10-28: qty 8.8

## 2015-10-28 MED ORDER — NICOTINE 21 MG/24HR TD PT24
21.0000 mg | MEDICATED_PATCH | Freq: Every day | TRANSDERMAL | Status: DC
  Administered 2015-10-29 – 2015-11-05 (×8): 21 mg via TRANSDERMAL
  Filled 2015-10-28 (×7): qty 1

## 2015-10-28 MED ORDER — MAGNESIUM HYDROXIDE 400 MG/5ML PO SUSP: 30.0000 mL | Freq: Every day | ORAL | Status: DC | PRN

## 2015-10-28 MED ORDER — CINACALCET HCL 30 MG PO TABS
60.0000 mg | ORAL_TABLET | Freq: Every day | ORAL | Status: DC
  Administered 2015-10-29 – 2015-11-05 (×7): 60 mg via ORAL
  Filled 2015-10-28 (×8): qty 2

## 2015-10-28 MED ORDER — DOCUSATE SODIUM 100 MG PO CAPS
100.0000 mg | ORAL_CAPSULE | Freq: Two times a day (BID) | ORAL | Status: DC
  Administered 2015-10-28 – 2015-10-29 (×3): 100 mg via ORAL
  Filled 2015-10-28 (×4): qty 1

## 2015-10-28 MED ORDER — ALBUTEROL SULFATE HFA 108 (90 BASE) MCG/ACT IN AERS
2.0000 | INHALATION_SPRAY | Freq: Four times a day (QID) | RESPIRATORY_TRACT | Status: DC | PRN
  Filled 2015-10-28: qty 6.7

## 2015-10-28 NOTE — ED Notes (Signed)
Patient using phone at this time.

## 2015-10-28 NOTE — Plan of Care (Signed)
Problem: Coping: Goal: Ability to verbalize frustrations and anger appropriately will improve Outcome: Not Progressing Angry , agitated  About being here.

## 2015-10-28 NOTE — ED Notes (Signed)
Patient was given items for a shower.

## 2015-10-28 NOTE — Progress Notes (Signed)
Pt accepted to [facility], attending Dr. Bary Leriche, room 3021. Pt can arrive 3pm. Report # is [phone]. Pt under IVC.

## 2015-10-28 NOTE — Tx Team (Signed)
Initial Interdisciplinary Treatment Plan   PATIENT STRESSORS: Health problems Medication change or noncompliance   PATIENT STRENGTHS: Ability for insight Average or above average intelligence Supportive family/friends   PROBLEM LIST: Problem List/Patient Goals Date to be addressed Date deferred Reason deferred Estimated date of resolution  Chronic Kidney desease 10/28/15     Bipolar 10/28/15     Copd 10/28/15     Asthma 10/28/15     HTN 10/28/15                              DISCHARGE CRITERIA:  Ability to meet basic life and health needs Medical problems require only outpatient monitoring Safe-care adequate arrangements made  PRELIMINARY DISCHARGE PLAN: Outpatient therapy Return to previous living arrangement  PATIENT/FAMIILY INVOLVEMENT: This treatment plan has been presented to and reviewed with the patient, Jennifer Lawson, and/or family member,   The patient and family have been given the opportunity to ask questions and make suggestions.  Lavonna Lampron A Roshawnda Pecora 10/28/2015, 6:13 PM

## 2015-10-28 NOTE — Plan of Care (Signed)
Problem: Coping: Goal: Ability to cope will improve Outcome: Not Progressing Patient has not developed coping skills at this point on th unit this shift [place] RN
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
## 2015-10-28 NOTE — Progress Notes (Signed)
D: Patient is alert and oriented to person on the unit this shift. Patient attended   in groups today. Patient denies suicidal ideation, homicidal ideation,has  auditory  hallucinations at the present time.  A: Scheduled medications are administered to patient as per MD orders. Emotional support and encouragement are provided. Patient is maintained on q.15 minute safety checks. Patient is informed to notify staff with questions or concerns. R: No adverse medication reactions are noted. Patient is cooperative with medication administration and treatment plan today. Patient is  preoccupied, calm and cooperative on the unit at this time. Patient interacts  l with others on the unit this shift but is intrusive to other patients at this Patient remains safe on unit  At this time
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
## 2015-10-28 NOTE — ED Notes (Signed)
Breakfast order called in at 0644. 

## 2015-10-28 NOTE — ED Notes (Addendum)
Patient used phone again, for the 4th time after being told not too.

## 2015-10-28 NOTE — ED Notes (Signed)
Patient was given a snack, coffee, and sprite Zero. A regular diet ordered for lunch.

## 2015-10-29 DIAGNOSIS — F312 Bipolar disorder, current episode manic severe with psychotic features: Principal | ICD-10-CM

## 2015-10-29 NOTE — [facility] Group Notes (Signed)
[facility] Group Notes:  (Nursing/MHT/Case Management/Adjunct)  Date:  10/29/2015  Time:  3:51 PM  Type of Therapy:  Psychoeducational Skills  Participation Level:  Did Not Attend  Jennifer Lawson Jennifer Lawson 10/29/2015, 3:51 PM
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
## 2015-10-29 NOTE — Progress Notes (Signed)
[facility]  ROUNDING NOTE   Subjective:   Admitted to Behavioral health. Dialysis for later today.   Objective:  Vital signs in last 24 hours:  Temp:  [98.5 F (36.9 C)-100.7 F (38.2 C)] 98.5 F (36.9 C) (06/22 0649) Pulse Rate:  [72-92] 89 (06/22 0649) Resp:  [16-18] 16 (06/22 0649) BP: (125-161)/(63-105) 125/63 mmHg (06/22 0649) SpO2:  [95 %-98 %] 98 % (06/21 2201) Weight:  [79.379 kg (175 lb)] 79.379 kg (175 lb) (06/21 1747)  Weight change:  Filed Weights   10/28/15 1747  Weight: 79.379 kg (175 lb)    Intake/Output:     Intake/Output this shift:  Total I/O In: 240 [P.O.:240] Out: -   Physical Exam: General: NAD, ambulating with walker  Head: Normocephalic, atraumatic. Moist oral mucosal membranes  Eyes: Anicteric, PERRL  Neck: Supple, trachea midline  Lungs:  Clear to auscultation  Heart: Regular rate and rhythm  Abdomen:  Soft, nontender,   Extremities: no peripheral edema.  Neurologic: Nonfocal, moving all four extremities  Skin: No lesions  Access: Right AVF    Basic Metabolic Panel:  Recent Labs Lab 10/27/15 1255  NA 140  K 4.0  CL 105  CO2 23  GLUCOSE 67  BUN 35*  CREATININE 5.66*  CALCIUM 9.4    Liver Function Tests:  Recent Labs Lab 10/27/15 1255  AST 17  ALT 10*  ALKPHOS 95  BILITOT 0.4  PROT 6.6  ALBUMIN 3.3*   No results for input(s): LIPASE, AMYLASE in the last 168 hours. No results for input(s): AMMONIA in the last 168 hours.  CBC:  Recent Labs Lab 10/27/15 1255  WBC 4.8  NEUTROABS 2.8  HGB 8.4*  HCT 27.4*  MCV 96.5  PLT 279    Cardiac Enzymes: No results for input(s): CKTOTAL, CKMB, CKMBINDEX, TROPONINI in the last 168 hours.  BNP: Invalid input(s): POCBNP  CBG: No results for input(s): GLUCAP in the last 168 hours.  Microbiology: Results for orders placed or performed during the hospital encounter of 08/06/15  Blood culture (routine x 2)     Status: None   Collection Time: 08/06/15   9:03 AM  Result Value Ref Range Status   Specimen Description BLOOD LEFT HAND  Final   Special Requests IN PEDIATRIC BOTTLE 1CC  Final   Culture NO GROWTH 5 DAYS  Final   Report Status 08/11/2015 FINAL  Final  Blood culture (routine x 2)     Status: None   Collection Time: 08/06/15  9:30 AM  Result Value Ref Range Status   Specimen Description BLOOD LEFT HAND  Final   Special Requests BOTTLES DRAWN AEROBIC AND ANAEROBIC 5CC  Final   Culture NO GROWTH 5 DAYS  Final   Report Status 08/11/2015 FINAL  Final  MRSA PCR Screening     Status: None   Collection Time: 08/06/15  6:25 PM  Result Value Ref Range Status   MRSA by PCR NEGATIVE NEGATIVE Final    Comment:        The GeneXpert MRSA Assay (FDA approved for NASAL specimens only), is one component of a comprehensive MRSA colonization surveillance program. It is not intended to diagnose MRSA infection nor to guide or monitor treatment for MRSA infections.     Coagulation Studies: No results for input(s): LABPROT, INR in the last 72 hours.  Urinalysis:  Recent Labs  10/28/15 0800  COLORURINE YELLOW  LABSPEC 1.010  PHURINE 7.0  [place] NEGATIVE  BILIRUBINUR NEGATIVE  Benjamin Stain  NEGATIVE  PROTEINUR >300*  NITRITE NEGATIVE  LEUKOCYTESUR NEGATIVE      Imaging: No results found.   Medications:     . cinacalcet  60 mg Oral Q breakfast  . divalproex  250 mg Oral Q8H  . docusate sodium  100 mg Oral BID  . doxepin  50 mg Oral QHS  . metoprolol tartrate  25 mg Oral BID  . mometasone-formoterol  2 puff Inhalation BID  . nicotine  21 mg Transdermal Q0600  . pantoprazole  40 mg Oral Daily  . sevelamer carbonate  1,600 mg Oral TID WC  . simvastatin  10 mg Oral q1800  . ziprasidone  120 mg Oral Q supper   acetaminophen, albuterol, alum & mag hydroxide-simeth, magnesium hydroxide  Assessment/ Plan:  [name] is a 55 y.o. black female with ESRD on HD since 1/16 followed by [facility], COPD, secondary hyperparathyroidism, anemia of CKD, bipolar affective disorder  1. ESRD on HD TTS: Dialysis for later today. Orders prepared.   2. Anemia of CKD: hemoglobin 8.4 Mircera as outpatient.  EPO with HD treatment  3. Secondary hyperparathyroidism of renal origin:  - sevelamer for binding.  - cinacalcet  4. Hyeprtension:  - metorpolol   LOS: 1 Gordie Crumby 6/22/201711:53 AM

## 2015-10-29 NOTE — Progress Notes (Signed)
PRE HD assessment 

## 2015-10-29 NOTE — Progress Notes (Signed)
PRE HD   

## 2015-10-29 NOTE — Progress Notes (Signed)
HD tx started  
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
## 2015-10-29 NOTE — Progress Notes (Signed)
Recreation Therapy Notes  Date: 06.22.17 Time: 9:30 am Location: Craft Room  Group Topic: Leisure Education  Goal Area(s) Addresses:  Patient will identify things they are grateful for. Patient will be educated on why it is important to be grateful.  Behavioral Response: Did not attend  Intervention: Grateful Wheel  Activity: Patients were given an I Am Grateful For worksheet and instructed to write things they were grateful for under each category.  Education: LRT educated patients on why it is important to be grateful.  Education Outcome: Patient did not attend group.  Clinical Observations/Feedback: Patient did not attend group.  Leonette Monarch, LRT/CTRS 10/29/2015 10:19 AM
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
## 2015-10-29 NOTE — BHH Suicide Risk Assessment (Signed)
[facility] Admission Suicide Risk Assessment   Nursing information obtained from:    Demographic factors:    Current Mental Status:    Loss Factors:    Historical Factors:    Risk Reduction Factors:     Total Time spent with patient: 1 hour Principal Problem: Bipolar affective disorder, current episode manic with psychotic symptoms ([place]) Diagnosis:   Patient Active Problem List   Diagnosis Date Noted  . Physical deconditioning [R53.81]   . Asthma [J45.909] 08/06/2015  . Cerebrovascular disease [I67.9] 07/23/2015  . Dyslipidemia [E78.5] 07/14/2015  . Bipolar affective disorder, current episode manic with psychotic symptoms ([place]) [F31.2] 07/13/2015  . Anemia of renal disease [D63.1] 01/26/2015  . CKD (chronic kidney disease) stage V requiring chronic dialysis ([place]) [N18.6, Z99.2] 05/13/2014  . COPD (chronic obstructive pulmonary disease) ([place]) [J44.9] 09/26/2013  . Hyperparathyroidism, primary ([place]) [E21.0] 02/20/2013  . Tobacco use disorder [F17.200] 11/13/2012  . HTN (hypertension) [I10] 02/20/2012   Subjective Data: Aggressive behavior.  Continued Clinical Symptoms:  Alcohol Use Disorder Identification Test Final Score (AUDIT): 0 The "Alcohol Use Disorders Identification Test", Guidelines for Use in Primary Care, Second Edition.  World Pharmacologist [facility]). Score between 0-7:  no or low risk or alcohol related problems. Score between 8-15:  moderate risk of alcohol related problems. Score between 16-19:  high risk of alcohol related problems. Score 20 or above:  warrants further diagnostic evaluation for alcohol dependence and treatment.   CLINICAL FACTORS:   Bipolar Disorder:   [place] Medical Diagnoses and Treatments/Surgeries   Musculoskeletal: Strength & Muscle Tone: within normal limits Gait & Station: normal Patient leans: N/A  Psychiatric Specialty Exam: Physical Exam  Nursing note and vitals reviewed.   Review of Systems  Musculoskeletal: Positive for  myalgias, back pain and joint pain.  Psychiatric/Behavioral: Positive for hallucinations.  All other systems reviewed and are negative.   Blood pressure 125/63, pulse 89, temperature 98.5 F (36.9 C), temperature source Oral, resp. rate 16, height 5' (1.524 m), weight 79.379 kg (175 lb), last menstrual period 05/09/2008, SpO2 98 %.Body mass index is 34.18 kg/(m^2).  General Appearance: Casual  Eye Contact:  Good  Speech:  Clear and Coherent  Volume:  Normal  Mood:  Angry, Dysphoric and Irritable  Affect:  Congruent  Thought Process:  Goal Directed  Orientation:  Full (Time, Place, and Person)  Thought Content:  WDL  Suicidal Thoughts:  No  Homicidal Thoughts:  No  Memory:  Immediate;   Fair Recent;   Fair Remote;   Fair  Judgement:  Poor  Insight:  Lacking  Psychomotor Activity:  Increased  Concentration:  Concentration: Fair and Attention Span: Fair  Recall:  [employer] of Knowledge:  Fair  Language:  Fair  Akathisia:  No  Handed:  Right  AIMS (if indicated):     Assets:  Communication Skills Desire for Improvement Financial Resources/Insurance Housing Resilience Social Support  ADL's:  Intact  Cognition:  WNL  Sleep:  Number of Hours: 7.15      COGNITIVE FEATURES THAT CONTRIBUTE TO RISK:  None    SUICIDE RISK:   Mild:  Suicidal ideation of limited frequency, intensity, duration, and specificity.  There are no identifiable plans, no associated intent, mild dysphoria and related symptoms, good self-control (both objective and subjective assessment), few other risk factors, and identifiable protective factors, including available and accessible social support.  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  1. Bipolar  mania. We restarted Depakote and Geodon for psychosis and mood stabilization.  2. Kidney disease. Nephrology consult is greatly appreciated. The patient continued on dialysis.  3. COPD. We continue inhalers, Albuterol and Dulera.  4. Hypertension. She is on metoprolol.   5. GERD. She is on Protonix.  6. Smoking. Nicotine patch is available.  7. Insomnia. We offered Doxepin.   8. Metabolic syndrome. Lipid panel, HgbA1C, TSH and PRL were done during prevoius admission.  9. Dyslipidemia. She is on Zocor.   10. Constipation. She is on Colace.   11. Chronic pain. The patient refuses all types of over-the-counter pain medication. We will consider offering fentanyl patch.   12. Disposition. She will be discharged to home with her daughter. She will follow up with Dr. Rosine Door and her nephrologist.  I certify that inpatient services furnished can reasonably be expected to improve the patient's condition.   Orson Slick, MD 10/29/2015, 1:35 PM

## 2015-10-29 NOTE — Progress Notes (Signed)
Post HD  

## 2015-10-29 NOTE — Plan of Care (Signed)
Problem: Coping: Goal: Ability to verbalize frustrations and anger appropriately will improve Outcome: Not Progressing Upset angry about her being here, unable cope

## 2015-10-29 NOTE — Progress Notes (Signed)
HD TX ended  

## 2015-10-30 LAB — CBC
HCT: 28.4 % — ABNORMAL LOW (ref 35.0–47.0)
Hemoglobin: 9.2 g/dL — ABNORMAL LOW (ref 12.0–16.0)
MCH: 31.5 pg (ref 26.0–34.0)
MCHC: 32.2 g/dL (ref 32.0–36.0)
MCV: 97.7 fL (ref 80.0–100.0)
Platelets: 236 10*3/uL (ref 150–440)
RBC: 2.91 MIL/uL — ABNORMAL LOW (ref 3.80–5.20)
RDW: 20.1 % — ABNORMAL HIGH (ref 11.5–14.5)
WBC: 4.2 10*3/uL (ref 3.6–11.0)

## 2015-10-30 LAB — RENAL FUNCTION PANEL
Albumin: 3.5 g/dL (ref 3.5–5.0)
Anion gap: 8 (ref 5–15)
BUN: 27 mg/dL — ABNORMAL HIGH (ref 6–20)
CO2: 29 mmol/L (ref 22–32)
Calcium: 9.5 mg/dL (ref 8.9–10.3)
Chloride: 102 mmol/L (ref 101–111)
Creatinine, Ser: 4.41 mg/dL — ABNORMAL HIGH (ref 0.44–1.00)
GFR calc Af Amer: 12 mL/min — ABNORMAL LOW (ref >60)
GFR calc non Af Amer: 10 mL/min — ABNORMAL LOW (ref >60)
Glucose, Bld: 73 mg/dL (ref 65–99)
Phosphorus: 5 mg/dL — ABNORMAL HIGH (ref 2.5–4.6)
Potassium: 4.1 mmol/L (ref 3.5–5.1)
Sodium: 139 mmol/L (ref 135–145)

## 2015-10-30 LAB — VALPROIC ACID LEVEL: Valproic Acid Lvl: 32 ug/mL — ABNORMAL LOW (ref 50.0–100.0)

## 2015-10-30 MED ORDER — DIVALPROEX SODIUM 250 MG PO DR TAB
250.0000 mg | DELAYED_RELEASE_TABLET | Freq: Three times a day (TID) | ORAL | Status: DC
  Administered 2015-10-30 – 2015-11-02 (×10): 250 mg via ORAL
  Filled 2015-10-30 (×11): qty 1

## 2015-10-30 MED ORDER — DOCUSATE SODIUM 100 MG PO CAPS
100.0000 mg | ORAL_CAPSULE | Freq: Two times a day (BID) | ORAL | Status: DC
  Administered 2015-10-31 – 2015-11-05 (×10): 100 mg via ORAL
  Filled 2015-10-30 (×13): qty 1

## 2015-10-30 MED ORDER — METOPROLOL TARTRATE 25 MG PO TABS
25.0000 mg | ORAL_TABLET | Freq: Two times a day (BID) | ORAL | Status: DC
  Administered 2015-10-30 – 2015-11-05 (×11): 25 mg via ORAL
  Filled 2015-10-30 (×12): qty 1

## 2015-10-30 NOTE — BHH Group Notes (Signed)
[place] LCSW Group Therapy  10/30/2015 1:42 PM  Type of Therapy:  Group Therapy  Participation Level:  Did Not Attend  Modes of Intervention:  Discussion, Education, Socialization and Support  Summary of Progress/Problems: Feelings around Relapse. Group members discussed the meaning of relapse and shared personal stories of relapse, how it affected them and others, and how they perceived themselves during this time. Group members were encouraged to identify triggers, warning signs and coping skills used when facing the possibility of relapse. Social supports were discussed and explored in detail.   [place] MSW, [place]  10/30/2015, 1:42 PM
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
## 2015-10-30 NOTE — BHH Group Notes (Signed)
[facility] Group Notes:  (Nursing/MHT/Case Management/Adjunct)  Date:  10/30/2015  Time:  4:06 AM  Type of Therapy:  Group Therapy  Participation Level:  None  Participation Quality:  Intrusive  Affect:  Flat  Cognitive:  Disorganized  Insight:  Lacking  Engagement in Group:  None  Modes of Intervention:  Discussion  Summary of Progress/Problems:  Doreena Maulden Joy Ceria Suminski 10/30/2015, 4:06 AM
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
## 2015-10-30 NOTE — Progress Notes (Signed)
D: Pt denies SI/HI/AVH, affect is flat and sad, but brightens upon approach. Pt is less irritable, S/P dialysis VS stable no bizarre behavior noted.Pt  appears less anxious and she is interacting with peers and staff appropriately.  A: Pt was offered support and encouragement. Pt was given scheduled medications. Pt was encouraged to attend groups. Q 15 minute checks were done for safety.  R:Pt attends groups and interacts well with peers and staff. Pt is taking medication. Pt has no complaints.Pt receptive to treatment and safety maintained on unit.
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
## 2015-10-30 NOTE — BHH Suicide Risk Assessment (Signed)
[facility] INPATIENT:  Family/Significant Other Suicide Prevention Education  Suicide Prevention Education:  Patient Refusal for Family/Significant Other Suicide Prevention Education: The patient Jennifer Lawson has refused to provide written consent for family/significant other to be provided Family/Significant Other Suicide Prevention Education during admission and/or prior to discharge.  Physician notified.  CSW completed SPe with the pt.  [name] 10/30/2015, 2:02 PM

## 2015-10-30 NOTE — Progress Notes (Signed)
D: Pt denies SI/HI/AVH. Pt is less irritable, but cooperative with care. Affect is flat and sad, but brightens upon approach.  Pt is S/P dialysis VS are stable no bizarre behavior noted. Patient  appears less anxious and she is interacting with peers and staff appropriately.  A: Pt was offered support and encouragement. Pt was given scheduled medications. Pt was encouraged to attend groups. Q 15 minute checks were done for safety.  R:Pt attends groups and interacts well with peers and staff. Pt is taking medication. Pt has no complaints.Pt receptive to treatment and safety maintained on unit.

## 2015-10-30 NOTE — Progress Notes (Signed)
Recreation Therapy Notes  Date: 06.23.17 Time: 1:00 pm Location: Craft Room  Group Topic: Coping Skills  Goal Area(s) Addresses:  Patient will participate in coping skill. Patient will verbalize benefit of art as a coping skill.  Behavioral Response: Did not attend  Intervention: Coloring  Activity: Patients were given coloring sheets to color and instructed to think about the emotions they were feeling and what their mind was focused on.  Education: LRT educated patients on healthy coping skills.  Education Outcome: Patient did not attend group.  Clinical Observations/Feedback: Patient did not attend group.  Leonette Monarch, LRT/CTRS 10/30/2015 2:57 PM

## 2015-10-31 ENCOUNTER — Other Ambulatory Visit: Payer: Self-pay

## 2015-10-31 LAB — HEPATITIS B CORE ANTIBODY, TOTAL: Hep B Core Total Ab: NEGATIVE

## 2015-10-31 LAB — CBC
HCT: 28 % — ABNORMAL LOW (ref 35.0–47.0)
Hemoglobin: 9 g/dL — ABNORMAL LOW (ref 12.0–16.0)
MCH: 31.3 pg (ref 26.0–34.0)
MCHC: 32.3 g/dL (ref 32.0–36.0)
MCV: 97 fL (ref 80.0–100.0)
Platelets: 220 10*3/uL (ref 150–440)
RBC: 2.88 MIL/uL — ABNORMAL LOW (ref 3.80–5.20)
RDW: 20.1 % — ABNORMAL HIGH (ref 11.5–14.5)
WBC: 4.1 10*3/uL (ref 3.6–11.0)

## 2015-10-31 LAB — RENAL FUNCTION PANEL
Albumin: 3.4 g/dL — ABNORMAL LOW (ref 3.5–5.0)
Anion gap: 8 (ref 5–15)
BUN: 32 mg/dL — ABNORMAL HIGH (ref 6–20)
CO2: 28 mmol/L (ref 22–32)
Calcium: 9.8 mg/dL (ref 8.9–10.3)
Chloride: 104 mmol/L (ref 101–111)
Creatinine, Ser: 5.1 mg/dL — ABNORMAL HIGH (ref 0.44–1.00)
GFR calc Af Amer: 10 mL/min — ABNORMAL LOW (ref >60)
GFR calc non Af Amer: 9 mL/min — ABNORMAL LOW (ref >60)
Glucose, Bld: 83 mg/dL (ref 65–99)
Phosphorus: 5.4 mg/dL — ABNORMAL HIGH (ref 2.5–4.6)
Potassium: 4.6 mmol/L (ref 3.5–5.1)
Sodium: 140 mmol/L (ref 135–145)

## 2015-10-31 LAB — HEPATITIS B SURFACE ANTIBODY,QUALITATIVE: Hep B S Ab: REACTIVE

## 2015-10-31 LAB — HEPATITIS B SURFACE ANTIGEN: Hepatitis B Surface Ag: NEGATIVE

## 2015-10-31 MED ORDER — LORAZEPAM 2 MG/ML IJ SOLN
2.0000 mg | Freq: Once | INTRAMUSCULAR | Status: AC
  Administered 2015-10-31: 2 mg via INTRAVENOUS
  Filled 2015-10-31: qty 1

## 2015-10-31 MED ORDER — EPOETIN ALFA 10000 UNIT/ML IJ SOLN
10000.0000 [IU] | Freq: Once | INTRAMUSCULAR | Status: AC
  Administered 2015-10-31: 10000 [IU] via INTRAVENOUS
  Filled 2015-10-31: qty 1

## 2015-10-31 NOTE — BHH Group Notes (Signed)
[facility] LCSW Group Therapy  10/31/2015 2:19 PM  Type of Therapy:  Group Therapy  Participation Level:  Did Not Attend  Modes of Intervention:  Discussion, Education, Socialization and Support  Summary of Progress/Problems: Pt will identify unhealthy thoughts and how they impact their emotions and behavior. Pt will be encouraged to discuss these thoughts, emotions and behaviors with the group.   [place] MSW, [place]  10/31/2015, 2:19 PM

## 2015-10-31 NOTE — Progress Notes (Signed)
Post hd tx 

## 2015-10-31 NOTE — Progress Notes (Signed)
D: Patient has been agitated and irritable this evening. She has been fixated on dialysis and food. Constantly asking for food because she's getting dialysis. Needs constant redirection. She denies SI/HI/AVH. Verbally abusive towards staff. Stated to this Probation officer, "do you have a name for yourself, is it the [place]?"  A: Medication was given with education. Encouragement was provided.  R: Patient was compliant with medication after some encouragement. Safety maintained with 15 min checks.

## 2015-10-31 NOTE — Progress Notes (Signed)
Pre-hd tx 
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
## 2015-10-31 NOTE — Progress Notes (Signed)
Patient started yelling "This don't make no fucking sense,this some bullshit, I ain't committed no crime for the police to be in here." Explained to patient why she was here in dialysis department,able to redirect for a few minutes, then patient started questioning why she is here.Dr.Kolluru chairside and gave order for prn Ativan with dialysis treatment.

## 2015-10-31 NOTE — Progress Notes (Signed)
Patient complained of having chest pain. VS obtained(BP=132/70, HR=72, Temp.98.5, O2 sats=98%) and MD notified. EKG STAT ordered and done. Patient was then escorted to dialysis with a sitter and security present.

## 2015-10-31 NOTE — Progress Notes (Signed)
[facility]  ROUNDING NOTE   Subjective:   Seen and examined on hemodialysis. Tolerating treatment well.   Objective:  Vital signs in last 24 hours:  Temp:  [98.4 F (36.9 C)-98.8 F (37.1 C)] 98.4 F (36.9 C) (06/24 0930) Pulse Rate:  [74-98] 74 (06/24 0945) Resp:  [16-18] 16 (06/24 0945) BP: (135-162)/(76-94) 145/87 mmHg (06/24 0945) SpO2:  [93 %-98 %] 93 % (06/24 0945)  Weight change:  Filed Weights   10/28/15 1747 10/29/15 1430  Weight: 79.379 kg (175 lb) 79.3 kg (174 lb 13.2 oz)    Intake/Output: I/O last 3 completed shifts: In: 140 [P.O.:140] Out: -    Intake/Output this shift:  Total I/O In: 120 [P.O.:120] Out: -   Physical Exam: General: NAD, sitting in chair  Head: Normocephalic, atraumatic. Moist oral mucosal membranes  Eyes: Anicteric, PERRL  Neck: Supple, trachea midline  Lungs:  Clear to auscultation  Heart: Regular rate and rhythm  Abdomen:  Soft, nontender,   Extremities: no peripheral edema.  Neurologic: Nonfocal, moving all four extremities  Skin: No lesions  Access: Right AVF    Basic Metabolic Panel:  Recent Labs Lab 10/27/15 1255 10/29/15 1219  NA 140 139  K 4.0 4.1  CL 105 102  CO2 23 29  GLUCOSE 67 73  BUN 35* 27*  CREATININE 5.66* 4.41*  CALCIUM 9.4 9.5  PHOS  --  5.0*    Liver Function Tests:  Recent Labs Lab 10/27/15 1255 10/29/15 1219  AST 17  --   ALT 10*  --   ALKPHOS 95  --   BILITOT 0.4  --   PROT 6.6  --   ALBUMIN 3.3* 3.5   No results for input(s): LIPASE, AMYLASE in the last 168 hours. No results for input(s): AMMONIA in the last 168 hours.  CBC:  Recent Labs Lab 10/27/15 1255 10/29/15 1218  WBC 4.8 4.2  NEUTROABS 2.8  --   HGB 8.4* 9.2*  HCT 27.4* 28.4*  MCV 96.5 97.7  PLT 279 236    Cardiac Enzymes: No results for input(s): CKTOTAL, CKMB, CKMBINDEX, TROPONINI in the last 168 hours.  BNP: Invalid input(s): POCBNP  CBG: No results for input(s): GLUCAP in the last 168  hours.  Microbiology: Results for orders placed or performed during the hospital encounter of 08/06/15  Blood culture (routine x 2)     Status: None   Collection Time: 08/06/15  9:03 AM  Result Value Ref Range Status   Specimen Description BLOOD LEFT HAND  Final   Special Requests IN PEDIATRIC BOTTLE 1CC  Final   Culture NO GROWTH 5 DAYS  Final   Report Status 08/11/2015 FINAL  Final  Blood culture (routine x 2)     Status: None   Collection Time: 08/06/15  9:30 AM  Result Value Ref Range Status   Specimen Description BLOOD LEFT HAND  Final   Special Requests BOTTLES DRAWN AEROBIC AND ANAEROBIC 5CC  Final   Culture NO GROWTH 5 DAYS  Final   Report Status 08/11/2015 FINAL  Final  MRSA PCR Screening     Status: None   Collection Time: 08/06/15  6:25 PM  Result Value Ref Range Status   MRSA by PCR NEGATIVE NEGATIVE Final    Comment:        The GeneXpert MRSA Assay (FDA approved for NASAL specimens only), is one component of a comprehensive MRSA colonization surveillance program. It is not intended to diagnose MRSA infection nor to guide or monitor  treatment for MRSA infections.     Coagulation Studies: No results for input(s): LABPROT, INR in the last 72 hours.  Urinalysis: No results for input(s): COLORURINE, LABSPEC, PHURINE, GLUCOSEU, HGBUR, BILIRUBINUR, KETONESUR, PROTEINUR, UROBILINOGEN, NITRITE, LEUKOCYTESUR in the last 72 hours.  Invalid input(s): APPERANCEUR    Imaging: No results found.   Medications:     . cinacalcet  60 mg Oral Q breakfast  . divalproex  250 mg Oral TID WC  . docusate sodium  100 mg Oral BID AC & HS  . doxepin  50 mg Oral QHS  . metoprolol tartrate  25 mg Oral BID AC & HS  . mometasone-formoterol  2 puff Inhalation BID  . nicotine  21 mg Transdermal Q0600  . pantoprazole  40 mg Oral Daily  . sevelamer carbonate  1,600 mg Oral TID WC  . simvastatin  10 mg Oral q1800  . ziprasidone  120 mg Oral Q supper   acetaminophen, albuterol,  alum & mag hydroxide-simeth, magnesium hydroxide  Assessment/ Plan:  [name] is a 55 y.o. black female with ESRD on HD since 1/16 followed by [facility], COPD, secondary hyperparathyroidism, anemia of CKD, bipolar affective disorder  1. ESRD on HD TTS: Seen and examined on dialysis. Requiring sedation with ativan.   2. Anemia of CKD: hemoglobin 9.2 Mircera as outpatient.  EPO with HD treatment  3. Secondary hyperparathyroidism of renal origin:  - sevelamer for binding.  - cinacalcet  4. Hypertension: well controlled.  - metorpolol   LOS: 3 [name] 6/24/201710:01 AM

## 2015-10-31 NOTE — Progress Notes (Signed)
Hemodialysis start 

## 2015-10-31 NOTE — BHH Group Notes (Signed)
[place] Group Notes:  (Nursing/MHT/Case Management/Adjunct)  Date:  10/31/2015  Time:  3:07 AM  Type of Therapy:  Group Therapy  Participation Level:  Did Not Attend    Summary of Progress/Problems:  Jennifer Lawson 10/31/2015, 3:07 AM

## 2015-10-31 NOTE — Progress Notes (Signed)
Patient returned to the unit at about 1pm. Patient with several somatic complaints(ovary/chest pain, "I cant breathe", "I cant see", etc.). Patient also irritable, angry, demanding, restless and verbally aggressive. Post dialysis VS stable and WNL. PRN Tylenol given with slight relief verbalized. Patient reassured and encouraged to rest in bed. Currently resting quietly in bed, chest rise and fall noted.

## 2015-10-31 NOTE — Progress Notes (Signed)
Hemodialysis completed. 

## 2015-11-01 NOTE — Progress Notes (Signed)
Pt disoriented to situation. Complained of ovary pain and requested additional medications to help her sleep. Hard to keep on topic during assessment but denies any SI/AVH. Tangential, confused, irritable when requests are denied. Medication compliant. Safety maintained. Will continue to monitor.

## 2015-11-01 NOTE — BHH Group Notes (Signed)
[facility] Group Notes:  (Nursing/MHT/Case Management/Adjunct)  Date:  11/01/2015  Time:  1:36 AM  Type of Therapy:  Group Therapy  Participation Level:  Active  Participation Quality:  Appropriate, Intrusive and Redirectable  Affect:  Flat  Cognitive:  Alert  Insight:  Appropriate  Engagement in Group:  Engaged and Off Topic  Modes of Intervention:  Discussion  Summary of Progress/Problems: Pt was mainly spoke of the discomfort she was experiencing from her dialysis. Staff was not able to get pt to focus on goal setting. Pt interrupted other pt's as they shared their goal.   Marylynn Pearson 11/01/2015, 1:36 AM

## 2015-11-01 NOTE — BHH Group Notes (Signed)
[place] LCSW Group Therapy  11/01/2015 2:17 PM  Type of Therapy:  Group Therapy  Participation Level:  Did Not Attend  Modes of Intervention:  Discussion, Education, Socialization and Support  Summary of Progress/Problems:Todays topic: Grudges  Patients will be encouraged to discuss their thoughts, feelings, and behaviors as to why one holds on to grudges and reasons why people have grudges. Patients will process the impact of grudges on their daily lives and identify thoughts and feelings related to holding grudges. Patients will identify feelings and thoughts related to what life would look like without grudges.    [place] MSW, [place]  11/01/2015, 2:17 PM

## 2015-11-01 NOTE — Progress Notes (Signed)
Patient demanding and intrusive.irritable. Depressed affect, intrusive behavior. No SI/HI/AVH at this time. Tangential speech. Eats am meal, takes meds, refusing prn med for c/o general discomfort. Attends therapy group. Refuses inhaler this am. Minimal interaction with peers. Frequent demands at nursing station. Safety maintained.

## 2015-11-01 NOTE — Progress Notes (Addendum)
[facility] MD Progress Note  11/01/2015 5:52 AM Jennifer Lawson  MRN:  [id number]  Subjective:  Jennifer Lawson has been intrusive and argumentative. She questions her bipolar diagnosis and he leaves that all her symptoms result from trauma. She has been protesting taking Depakote. She keeps telling me that she is unnecessary in the hospital and when she did see Dr. Rosine Door on June 17th she was fine. We know now that she is allowed to return home when well. She is requesting family meeting. She takes medications as prescribed and participate in dialysis with no problems. We tried to check her Depakote level was done. We will do well with dialysis tomorrow.  Principal Problem: Bipolar affective disorder, current episode manic with psychotic symptoms ([place]) Diagnosis:   Patient Active Problem List   Diagnosis Date Noted  . Physical deconditioning [R53.81]   . Asthma [J45.909] 08/06/2015  . Cerebrovascular disease [I67.9] 07/23/2015  . Dyslipidemia [E78.5] 07/14/2015  . Bipolar affective disorder, current episode manic with psychotic symptoms ([place]) [F31.2] 07/13/2015  . Anemia of renal disease [D63.1] 01/26/2015  . CKD (chronic kidney disease) stage V requiring chronic dialysis ([place]) [N18.6, Z99.2] 05/13/2014  . COPD (chronic obstructive pulmonary disease) ([place]) [J44.9] 09/26/2013  . Hyperparathyroidism, primary ([place]) [E21.0] 02/20/2013  . Tobacco use disorder [F17.200] 11/13/2012  . HTN (hypertension) [I10] 02/20/2012   Total Time spent with patient: 20 minutes  Past Psychiatric History: Bipolar disorder.  Past Medical History:  Past Medical History  Diagnosis Date  . Mental disorder   . Depression   . Hypertension   . Overdose   . Tobacco use disorder 11/13/2012  . Complication of anesthesia     difficulty going to sleep  . Chronic kidney disease     06/11/13- not on dialysis  . Shortness of breath     lying down flat  . PTSD (post-traumatic stress disorder)   . Asthma   . COPD (chronic  obstructive pulmonary disease) ([place])   . Heart murmur   . GERD (gastroesophageal reflux disease)   . Seizures ([place])     "passsed out"  . History of blood transfusion   . Diabetes mellitus without complication [facility])     denies    Past Surgical History  Procedure Laterality Date  . Right knee replacement      she says it was last year.  . Esophagogastroduodenoscopy Left 11/14/2012    Procedure: ESOPHAGOGASTRODUODENOSCOPY (EGD);  Surgeon: [name], MD;  Location: [facility];  Service: Endoscopy;  Laterality: Left;  . Joint replacement Right     knee  . Parathyroidectomy    . Av fistula placement Right 06/12/2013    Procedure: ARTERIOVENOUS (AV) FISTULA CREATION; RIGHT  BASILIC VEIN TRANSPOSITION with Intraoperative ultrasound;  Surgeon: Mal [name], MD;  Location: [facility];  Service: Vascular;  Laterality: Right;   Family History:  Family History  Problem Relation Age of Onset  . Diabetes Mother   . Hyperlipidemia Mother   . Hypertension Mother   . Diabetes Father   . Hypertension Father   . Hyperlipidemia Father    Family Psychiatric  History: See H&P. Social History:  History  Alcohol Use No    Comment: none in over a month per daughter     History  Drug Use No    Comment: pt denied any drug use    Social History   Social History  . Marital Status: Divorced    Spouse Name: N/A  . Number of Children: N/A  .  Years of Education: N/A   Social History Main Topics  . Smoking status: Current Every Day Smoker -- 2.00 packs/day for 40 years    Types: Cigarettes, Cigars  . Smokeless tobacco: Never Used  . Alcohol Use: No     Comment: none in over a month per daughter  . Drug Use: No     Comment: pt denied any drug use  . Sexual Activity: No   Other Topics Concern  . None   Social History Narrative   Additional Social History:                         Sleep: Fair  Appetite:  Fair  Current Medications: Current Facility-Administered Medications   Medication Dose Route Frequency Provider Last Rate Last Dose  . acetaminophen (TYLENOL) tablet 650 mg  650 mg Oral Q6H PRN Clovis Fredrickson, MD   650 mg at 10/31/15 2208  . albuterol (PROVENTIL HFA;VENTOLIN HFA) 108 (90 Base) MCG/ACT inhaler 2 puff  2 puff Inhalation Q6H PRN Kennya Schwenn B Daniell Paradise, MD      . alum & mag hydroxide-simeth (MAALOX/MYLANTA) 200-200-20 MG/5ML suspension 30 mL  30 mL Oral Q4H PRN Kersten Salmons B Madaline Lefeber, MD      . cinacalcet (SENSIPAR) tablet 60 mg  60 mg Oral Q breakfast Verity Gilcrest B Eldwin Volkov, MD   60 mg at 10/31/15 0836  . divalproex (DEPAKOTE) DR tablet 250 mg  250 mg Oral TID WC Linzie Criss B Sirena Riddle, MD   250 mg at 10/31/15 1724  . docusate sodium (COLACE) capsule 100 mg  100 mg Oral BID AC & HS Taiya Nutting B Myiesha Edgar, MD   100 mg at 10/31/15 2115  . doxepin (SINEQUAN) capsule 50 mg  50 mg Oral QHS Clovis Fredrickson, MD   50 mg at 10/31/15 2115  . magnesium hydroxide (MILK OF MAGNESIA) suspension 30 mL  30 mL Oral Daily PRN Ethyle Tiedt B Melisssa Donner, MD      . metoprolol tartrate (LOPRESSOR) tablet 25 mg  25 mg Oral BID AC & HS Madhav Mohon B Brynley Cuddeback, MD   25 mg at 10/31/15 2115  . mometasone-formoterol (DULERA) 100-5 MCG/ACT inhaler 2 puff  2 puff Inhalation BID Clovis Fredrickson, MD   2 puff at 10/31/15 2100  . nicotine (NICODERM CQ - dosed in mg/24 hours) patch 21 mg  21 mg Transdermal Q0600 Clovis Fredrickson, MD   21 mg at 10/31/15 4540  . pantoprazole (PROTONIX) EC tablet 40 mg  40 mg Oral Daily Clovis Fredrickson, MD   40 mg at 10/31/15 0836  . sevelamer carbonate (RENVELA) tablet 1,600 mg  1,600 mg Oral TID WC Vanessa Kampf B Jolly Carlini, MD   1,600 mg at 10/31/15 1724  . simvastatin (ZOCOR) tablet 10 mg  10 mg Oral q1800 Lanecia Sliva B Samari Bittinger, MD   10 mg at 10/31/15 1724  . ziprasidone (GEODON) capsule 120 mg  120 mg Oral Q supper Clovis Fredrickson, MD   120 mg at 10/31/15 1724    Lab Results:  Results for orders placed or performed during the hospital encounter  of 10/28/15 (from the past 48 hour(s))  Valproic acid level     Status: Abnormal   Collection Time: 10/30/15 12:18 PM  Result Value Ref Range   Valproic Acid Lvl 32 (L) 50.0 - 100.0 ug/mL  Renal function panel     Status: Abnormal   Collection Time: 10/31/15  9:56 AM  Result Value Ref Range   Sodium 140 135 -  145 mmol/L   Potassium 4.6 3.5 - 5.1 mmol/L   Chloride 104 101 - 111 mmol/L   CO2 28 22 - 32 mmol/L   Glucose, Bld 83 65 - 99 mg/dL   BUN 32 (H) 6 - 20 mg/dL   Creatinine, Ser 5.10 (H) 0.44 - 1.00 mg/dL   Calcium 9.8 8.9 - 10.3 mg/dL   Phosphorus 5.4 (H) 2.5 - 4.6 mg/dL   Albumin 3.4 (L) 3.5 - 5.0 g/dL   GFR calc non Af Amer 9 (L) >60 mL/min   GFR calc Af Amer 10 (L) >60 mL/min    Comment: (NOTE) The eGFR has been calculated using the CKD EPI equation. This calculation has not been validated in all clinical situations. eGFR's persistently <60 mL/min signify possible Chronic Kidney Disease.    Anion gap 8 5 - 15  CBC     Status: Abnormal   Collection Time: 10/31/15  9:56 AM  Result Value Ref Range   WBC 4.1 3.6 - 11.0 K/uL   RBC 2.88 (L) 3.80 - 5.20 MIL/uL   Hemoglobin 9.0 (L) 12.0 - 16.0 g/dL   HCT 28.0 (L) 35.0 - 47.0 %   MCV 97.0 80.0 - 100.0 fL   MCH 31.3 26.0 - 34.0 pg   MCHC 32.3 32.0 - 36.0 g/dL   RDW 20.1 (H) 11.5 - 14.5 %   Platelets 220 150 - 440 K/uL    Blood Alcohol level:  Lab Results  Component Value Date   ETH <5 10/27/2015   ETH <5 43/15/4008    Metabolic Disorder Labs: Lab Results  Component Value Date   HGBA1C 4.3 06/03/2015   MPG 97 05/14/2014   MPG 94 02/06/2014   Lab Results  Component Value Date   PROLACTIN 126.4* 06/03/2015   Lab Results  Component Value Date   CHOL 184 06/03/2015   TRIG 102 06/03/2015   HDL 65 06/03/2015   CHOLHDL 2.8 06/03/2015   VLDL 20 06/03/2015   LDLCALC 99 06/03/2015   LDLCALC 99 01/09/2015    Physical Findings: AIMS: Facial and Oral Movements Muscles of Facial Expression: None, normal Lips and  Perioral Area: None, normal Jaw: None, normal Tongue: None, normal,Extremity Movements Upper (arms, wrists, hands, fingers): None, normal Lower (legs, knees, ankles, toes): None, normal, Trunk Movements Neck, shoulders, hips: None, normal, Overall Severity Severity of abnormal movements (highest score from questions above): None, normal Incapacitation due to abnormal movements: None, normal Patient's awareness of abnormal movements (rate only patient's report): No Awareness, Dental Status Current problems with teeth and/or dentures?: Yes Does patient usually wear dentures?: No  CIWA:    COWS:     Musculoskeletal: Strength & Muscle Tone: within normal limits Gait & Station: unsteady Patient leans: N/A  Psychiatric Specialty Exam: Physical Exam  Nursing note and vitals reviewed.   Review of Systems  Psychiatric/Behavioral: Positive for hallucinations. The patient is nervous/anxious.   All other systems reviewed and are negative.   Blood pressure 148/83, pulse 76, temperature 98.8 F (37.1 C), temperature source Oral, resp. rate 18, height 5' (1.524 m), weight 79.3 kg (174 lb 13.2 oz), last menstrual period 05/09/2008, SpO2 97 %.Body mass index is 34.14 kg/(m^2).  General Appearance: Casual  Eye Contact:  Good  Speech:  Clear and Coherent  Volume:  Normal  Mood:  Dysphoric and Irritable  Affect:  Congruent  Thought Process:  Disorganized  Orientation:  Full (Time, Place, and Person)  Thought Content:  WDL  Suicidal Thoughts:  No  Homicidal Thoughts:  No  Memory:  Immediate;   Fair Recent;   Fair Remote;   Fair  Judgement:  Poor  Insight:  Lacking  Psychomotor Activity:  Normal  Concentration:  Concentration: Fair and Attention Span: Fair  Recall:  [employer] of Knowledge:  Fair  Language:  Fair  Akathisia:  No  Handed:  Right  AIMS (if indicated):     Assets:  Communication Skills Desire for Improvement Financial Resources/Insurance Housing Resilience Social  Support  ADL's:  Intact  Cognition:  WNL  Sleep:  Number of Hours: 7.75     Treatment Plan Summary: Daily contact with patient to assess and evaluate symptoms and progress in treatment and Medication management   Jennifer Lawson is a 55 year old female with history of bipolar disorder and end-stage kidney disease on dialysis admitted for a manic episode and aggressive behavior in the context of treatment noncompliance.  1. Bipolar mania. We restarted Depakote and Geodon for psychosis and mood stabilization. VPA level on 6/23 was 37.   2. Kidney disease. Nephrology consult is greatly appreciated. The patient is continued on dialysis.  3. COPD. We continue inhalers, Albuterol and Dulera.  4. Hypertension. She is on metoprolol.   5. GERD. She is on Protonix.  6. Smoking. Nicotine patch is available.  7. Insomnia. We offered Doxepin.   8. Metabolic syndrome. Lipid panel, HgbA1C, TSH and PRL were done during prevoius admission.  9. Dyslipidemia. She is on Zocor.   10. Constipation. She is on Colace.   11. Chronic pain. The patient refuses all types of over-the-counter pain medication. We will consider offering fentanyl patch.   12. Disposition. She will be discharged to home with her daughter. She will follow up with Dr. Rosine Door and her nephrologist.  Orson Slick, MD 11/01/2015, 5:52 AM

## 2015-11-01 NOTE — Plan of Care (Signed)
Problem: Coping: Goal: Ability to cope will improve Outcome: Not Progressing Patient somatic and with poor coping skills.

## 2015-11-01 NOTE — [facility] Group Notes (Signed)
[facility] Group Notes:  (Nursing/MHT/Case Management/Adjunct)  Date:  11/01/2015  Time:  10:16 AM  Type of Therapy:  goal setting  Participation Level:  Minimal  Participation Quality:  Intrusive and Monopolizing  Affect:  Flat  Cognitive:  Appropriate  Insight:  Limited  Engagement in Group:  Monopolizing and Off Topic  Modes of Intervention:  goal setting  Summary of Progress/Problems: patient cont. To discuss pain and would not focus on goal setting   Charise Killian 11/01/2015, 10:16 AM

## 2015-11-02 NOTE — Plan of Care (Signed)
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

## 2015-11-02 NOTE — Progress Notes (Signed)
[facility] MD Progress Note  11/02/2015 12:29 PM Jennifer Lawson  MRN:  [id number]  Subjective:  Ms. Memory is loud, agitated, and argumentative today. She is paranoid and accusing that we are trying to kill, give her home medications and food and that they were coming to her from. She is very disorganized jumping from topic to topic and tangential. Her Depakote level was not checked again even though it was ordered. I worry that she has not been taking Depakote here as she believes that Depakote causes rectal bleeding.  Principal Problem: Bipolar affective disorder, current episode manic with psychotic symptoms ([place]) Diagnosis:   Patient Active Problem List   Diagnosis Date Noted  . Physical deconditioning [R53.81]   . Asthma [J45.909] 08/06/2015  . Cerebrovascular disease [I67.9] 07/23/2015  . Dyslipidemia [E78.5] 07/14/2015  . Bipolar affective disorder, current episode manic with psychotic symptoms ([place]) [F31.2] 07/13/2015  . Anemia of renal disease [D63.1] 01/26/2015  . CKD (chronic kidney disease) stage V requiring chronic dialysis ([place]) [N18.6, Z99.2] 05/13/2014  . COPD (chronic obstructive pulmonary disease) ([place]) [J44.9] 09/26/2013  . Hyperparathyroidism, primary ([place]) [E21.0] 02/20/2013  . Tobacco use disorder [F17.200] 11/13/2012  . HTN (hypertension) [I10] 02/20/2012   Total Time spent with patient: 20 minutes  Past Psychiatric History: Bipolar disorder.  Past Medical History:  Past Medical History  Diagnosis Date  . Mental disorder   . Depression   . Hypertension   . Overdose   . Tobacco use disorder 11/13/2012  . Complication of anesthesia     difficulty going to sleep  . Chronic kidney disease     06/11/13- not on dialysis  . Shortness of breath     lying down flat  . PTSD (post-traumatic stress disorder)   . Asthma   . COPD (chronic obstructive pulmonary disease) ([place])   . Heart murmur   . GERD (gastroesophageal reflux disease)   . Seizures ([place])     "passsed  out"  . History of blood transfusion   . Diabetes mellitus without complication [facility])     denies    Past Surgical History  Procedure Laterality Date  . Right knee replacement      she says it was last year.  . Esophagogastroduodenoscopy Left 11/14/2012    Procedure: ESOPHAGOGASTRODUODENOSCOPY (EGD);  Surgeon: [name], MD;  Location: [facility];  Service: Endoscopy;  Laterality: Left;  . Joint replacement Right     knee  . Parathyroidectomy    . Av fistula placement Right 06/12/2013    Procedure: ARTERIOVENOUS (AV) FISTULA CREATION; RIGHT  BASILIC VEIN TRANSPOSITION with Intraoperative ultrasound;  Surgeon: Mal [name], MD;  Location: [facility] OR;  Service: Vascular;  Laterality: Right;   Family History:  Family History  Problem Relation Age of Onset  . Diabetes Mother   . Hyperlipidemia Mother   . Hypertension Mother   . Diabetes Father   . Hypertension Father   . Hyperlipidemia Father    Family Psychiatric  History: See H&P. Social History:  History  Alcohol Use No    Comment: none in over a month per daughter     History  Drug Use No    Comment: pt denied any drug use    Social History   Social History  . Marital Status: Divorced    Spouse Name: N/A  . Number of Children: N/A  . Years of Education: N/A   Social History Main Topics  . Smoking status: Current Every Day Smoker -- 2.00 packs/day for 40  years    Types: Cigarettes, Cigars  . Smokeless tobacco: Never Used  . Alcohol Use: No     Comment: none in over a month per daughter  . Drug Use: No     Comment: pt denied any drug use  . Sexual Activity: No   Other Topics Concern  . None   Social History Narrative   Additional Social History:                         Sleep: Fair  Appetite:  Fair  Current Medications: Current Facility-Administered Medications  Medication Dose Route Frequency Provider Last Rate Last Dose  . acetaminophen (TYLENOL) tablet 650 mg  650 mg Oral Q6H PRN Clovis Fredrickson, MD   650 mg at 11/01/15 2119  . albuterol (PROVENTIL HFA;VENTOLIN HFA) 108 (90 Base) MCG/ACT inhaler 2 puff  2 puff Inhalation Q6H PRN Jolanta B Pucilowska, MD      . alum & mag hydroxide-simeth (MAALOX/MYLANTA) 200-200-20 MG/5ML suspension 30 mL  30 mL Oral Q4H PRN Jolanta B Pucilowska, MD      . cinacalcet (SENSIPAR) tablet 60 mg  60 mg Oral Q breakfast Jolanta B Pucilowska, MD   60 mg at 11/02/15 0911  . divalproex (DEPAKOTE) DR tablet 250 mg  250 mg Oral TID WC Jolanta B Pucilowska, MD   250 mg at 11/02/15 1148  . docusate sodium (COLACE) capsule 100 mg  100 mg Oral BID AC & HS Jolanta B Pucilowska, MD   100 mg at 11/02/15 0911  . doxepin (SINEQUAN) capsule 50 mg  50 mg Oral QHS Clovis Fredrickson, MD   50 mg at 11/01/15 2119  . magnesium hydroxide (MILK OF MAGNESIA) suspension 30 mL  30 mL Oral Daily PRN Jolanta B Pucilowska, MD      . metoprolol tartrate (LOPRESSOR) tablet 25 mg  25 mg Oral BID AC & HS Jolanta B Pucilowska, MD   25 mg at 11/02/15 0911  . mometasone-formoterol (DULERA) 100-5 MCG/ACT inhaler 2 puff  2 puff Inhalation BID Clovis Fredrickson, MD   2 puff at 11/02/15 0910  . nicotine (NICODERM CQ - dosed in mg/24 hours) patch 21 mg  21 mg Transdermal Q0600 Clovis Fredrickson, MD   21 mg at 11/02/15 0913  . pantoprazole (PROTONIX) EC tablet 40 mg  40 mg Oral Daily Clovis Fredrickson, MD   40 mg at 11/02/15 0911  . sevelamer carbonate (RENVELA) tablet 1,600 mg  1,600 mg Oral TID WC Jolanta B Pucilowska, MD   1,600 mg at 11/02/15 1148  . simvastatin (ZOCOR) tablet 10 mg  10 mg Oral q1800 Jolanta B Pucilowska, MD   10 mg at 11/01/15 1646  . ziprasidone (GEODON) capsule 120 mg  120 mg Oral Q supper Clovis Fredrickson, MD   120 mg at 11/01/15 1646    Lab Results: No results found for this or any previous visit (from the past 23 hour(s)).  Blood Alcohol level:  Lab Results  Component Value Date   [facility] <5 10/27/2015   ETH <5 99991111    Metabolic Disorder  Labs: Lab Results  Component Value Date   HGBA1C 4.3 06/03/2015   MPG 97 05/14/2014   MPG 94 02/06/2014   Lab Results  Component Value Date   PROLACTIN 126.4* 06/03/2015   Lab Results  Component Value Date   CHOL 184 06/03/2015   TRIG 102 06/03/2015   HDL 65 06/03/2015   CHOLHDL 2.8  06/03/2015   VLDL 20 06/03/2015   LDLCALC 99 06/03/2015   LDLCALC 99 01/09/2015    Physical Findings: AIMS: Facial and Oral Movements Muscles of Facial Expression: None, normal Lips and Perioral Area: None, normal Jaw: None, normal Tongue: None, normal,Extremity Movements Upper (arms, wrists, hands, fingers): None, normal Lower (legs, knees, ankles, toes): None, normal, Trunk Movements Neck, shoulders, hips: None, normal, Overall Severity Severity of abnormal movements (highest score from questions above): None, normal Incapacitation due to abnormal movements: None, normal Patient's awareness of abnormal movements (rate only patient's report): No Awareness, Dental Status Current problems with teeth and/or dentures?: Yes Does patient usually wear dentures?: No  CIWA:    COWS:     Musculoskeletal: Strength & Muscle Tone: within normal limits Gait & Station: normal Patient leans: N/A  Psychiatric Specialty Exam: Physical Exam  Nursing note and vitals reviewed.   Review of Systems  Musculoskeletal: Positive for myalgias and back pain.  Psychiatric/Behavioral: Positive for hallucinations.  All other systems reviewed and are negative.   Blood pressure 116/67, pulse 72, temperature 98.1 F (36.7 C), temperature source Oral, resp. rate 18, height 5' (1.524 m), weight 79.3 kg (174 lb 13.2 oz), last menstrual period 05/09/2008, SpO2 94 %.Body mass index is 34.14 kg/(m^2).  General Appearance: Fairly Groomed  Eye Contact:  Good  Speech:  Pressured  Volume:  Increased  Mood:  Angry, Dysphoric and Irritable  Affect:  Congruent  Thought Process:  Disorganized  Orientation:  Full (Time,  Place, and Person)  Thought Content:  Delusions and Paranoid Ideation  Suicidal Thoughts:  No  Homicidal Thoughts:  No  Memory:  Immediate;   Fair Recent;   Fair Remote;   Fair  Judgement:  Poor  Insight:  Lacking  Psychomotor Activity:  Increased  Concentration:  Concentration: Poor and Attention Span: Poor  Recall:  Poor  Fund of Knowledge:  Fair  Language:  Fair  Akathisia:  No  Handed:  Right  AIMS (if indicated):     Assets:  Communication Skills Desire for Improvement Financial Resources/Insurance Housing Resilience Social Support  ADL's:  Intact  Cognition:  WNL  Sleep:  Number of Hours: 4.5     Treatment Plan Summary: Daily contact with patient to assess and evaluate symptoms and progress in treatment and Medication management   Jennifer Lawson is a 55 year old female with history of bipolar disorder and end-stage kidney disease on dialysis admitted for a manic episode and aggressive behavior in the context of treatment noncompliance.  1. Bipolar mania. We restarted Depakote and Geodon for psychosis and mood stabilization. VPA level on 6/23 was 37.   2. Kidney disease. Nephrology consult is greatly appreciated. The patient is continued on dialysis.  3. COPD. We continue inhalers, Albuterol and Dulera.  4. Hypertension. She is on metoprolol.   5. GERD. She is on Protonix.  6. Smoking. Nicotine patch is available.  7. Insomnia. We offered Doxepin.   8. Metabolic syndrome. Lipid panel, HgbA1C, TSH and PRL were done during prevoius admission.  9. Dyslipidemia. She is on Zocor.   10. Constipation. She is on Colace.   11. Chronic pain. The patient refuses all types of over-the-counter pain medication. We will consider offering fentanyl patch.   12. Disposition. She will be discharged to home with her daughter. She will follow up with Dr. Rosine Door and her nephrologist.  Orson Slick, MD 11/02/2015, 12:29 PM

## 2015-11-02 NOTE — Plan of Care (Signed)
Problem: Health Behavior/Discharge Planning: Goal: Compliance with treatment plan for underlying cause of condition will improve Outcome: Progressing Patient was compliant with medication this evening.

## 2015-11-02 NOTE — Progress Notes (Signed)
Patient is very intrusive,argumentative and yelling at staff.Frequently knocking at the nurse's station door & not easily redirectable.Stated that patient is having chest pain.Vital signs stable.Patient refused to lie down.Repeatedly asking the same things over & over.Compliant with medications.Ambulated with & without walker.

## 2015-11-02 NOTE — Progress Notes (Signed)
D: Patient has been agitated and irritable this evening. Still continues to be fixated on dialysis and food. Constantly asking for food because she's getting dialysis. Needs constant redirection. She denies SI/HI/AVH. Verbally abusive towards staff. Flipped this Probation officer off.  A: Medication was given with education. Encouragement was provided.  R: Patient was compliant with medication after some encouragement. Safety maintained with 15 min checks.

## 2015-11-02 NOTE — Progress Notes (Signed)
Recreation Therapy Notes  Date: 06.26.17 Time: 9:30 am Location: Craft Room  Group Topic: Self-expression  Goal Area(s) Addresses:  Patient will effectively use art as a means of self-expression. Patient will be able to identify one emotion experienced during group session.  Behavioral Response: Did not attend  Intervention: Two Faces of Me  Activity: Patients were given a blank face worksheet and instructed to draw a line down the middle. On one side of the face, patients were instructed to draw or write how they felt when they were admitted to the hospital and on the other side they were instructed to draw or write how they want to feel when they are discharged.  Education: LRT educated patients on different forms of self-expression.  Education Outcome: Patient did not attend group.   Clinical Observations/Feedback: Patient did not attend group.  Leonette Monarch, LRT/CTRS 11/02/2015 10:25 AM

## 2015-11-02 NOTE — Progress Notes (Signed)
D: Patient is alert and oriented to person only on the unit this shift. Patient attended   in groups today. Patient denies suicidal ideation, homicidal ideation, auditory or visual hallucinations at the present time.  A: Scheduled medications are administered to patient as per MD orders. Emotional support and encouragement are provided. Patient is maintained on q.15 minute safety checks. Patient is informed to notify staff with questions or concerns. R: No adverse medication reactions are noted. Patient is cooperative with medication administration and treatment plan today. Patient is needy and argumentative,not  Receptive  Anxiety 5/10,depression 6/10 on the unit at this time. Patient does not interact  with others on the unit this shift. Patient contracts for safety at this time. Patient remains safe at this time.

## 2015-11-02 NOTE — [facility] Group Notes (Signed)
[place] Group Notes:  (Nursing/MHT/Case Management/Adjunct)  Date:  11/02/2015  Time:  4:11 PM  Type of Therapy:  Psychoeducational Skills  Participation Level:  Did Not Attend    Drake Leach 11/02/2015, 4:11 PM

## 2015-11-03 LAB — RENAL FUNCTION PANEL
Albumin: 3.3 g/dL — ABNORMAL LOW (ref 3.5–5.0)
Anion gap: 9 (ref 5–15)
BUN: 36 mg/dL — ABNORMAL HIGH (ref 6–20)
CO2: 26 mmol/L (ref 22–32)
Calcium: 9 mg/dL (ref 8.9–10.3)
Chloride: 100 mmol/L — ABNORMAL LOW (ref 101–111)
Creatinine, Ser: 5.61 mg/dL — ABNORMAL HIGH (ref 0.44–1.00)
GFR calc Af Amer: 9 mL/min — ABNORMAL LOW (ref >60)
GFR calc non Af Amer: 8 mL/min — ABNORMAL LOW (ref >60)
Glucose, Bld: 119 mg/dL — ABNORMAL HIGH (ref 65–99)
Phosphorus: 5.3 mg/dL — ABNORMAL HIGH (ref 2.5–4.6)
Potassium: 4.7 mmol/L (ref 3.5–5.1)
Sodium: 135 mmol/L (ref 135–145)

## 2015-11-03 LAB — VALPROIC ACID LEVEL: Valproic Acid Lvl: 40 ug/mL — ABNORMAL LOW (ref 50.0–100.0)

## 2015-11-03 LAB — CBC
HCT: 28.8 % — ABNORMAL LOW (ref 35.0–47.0)
Hemoglobin: 9.4 g/dL — ABNORMAL LOW (ref 12.0–16.0)
MCH: 31.6 pg (ref 26.0–34.0)
MCHC: 32.6 g/dL (ref 32.0–36.0)
MCV: 96.9 fL (ref 80.0–100.0)
Platelets: 185 10*3/uL (ref 150–440)
RBC: 2.97 MIL/uL — ABNORMAL LOW (ref 3.80–5.20)
RDW: 19.6 % — ABNORMAL HIGH (ref 11.5–14.5)
WBC: 3.7 10*3/uL (ref 3.6–11.0)

## 2015-11-03 MED ORDER — IBUPROFEN 600 MG PO TABS
600.0000 mg | ORAL_TABLET | Freq: Four times a day (QID) | ORAL | Status: DC | PRN
  Administered 2015-11-03 – 2015-11-05 (×5): 600 mg via ORAL
  Filled 2015-11-03 (×5): qty 1

## 2015-11-03 MED ORDER — LORAZEPAM 0.5 MG PO TABS
0.5000 mg | ORAL_TABLET | Freq: Once | ORAL | Status: AC
  Administered 2015-11-03: 0.5 mg via ORAL

## 2015-11-03 MED ORDER — IRON SUCROSE 20 MG/ML IV SOLN
100.0000 mg | Freq: Once | INTRAVENOUS | Status: AC
  Administered 2015-11-03: 100 mg via INTRAVENOUS
  Filled 2015-11-03: qty 5

## 2015-11-03 MED ORDER — LORAZEPAM 2 MG/ML IJ SOLN
2.0000 mg | Freq: Once | INTRAMUSCULAR | Status: AC
  Administered 2015-11-03: 2 mg via INTRAVENOUS
  Filled 2015-11-03 (×2): qty 1

## 2015-11-03 MED ORDER — EPOETIN ALFA 4000 UNIT/ML IJ SOLN
4000.0000 [IU] | INTRAMUSCULAR | Status: DC
  Administered 2015-11-03 – 2015-11-05 (×2): 4000 [IU] via INTRAVENOUS
  Filled 2015-11-03 (×3): qty 1

## 2015-11-03 MED ORDER — IBUPROFEN 400 MG PO TABS: 400.0000 mg | ORAL_TABLET | Freq: Four times a day (QID) | ORAL | Status: DC | PRN

## 2015-11-03 MED ORDER — DIVALPROEX SODIUM 500 MG PO DR TAB
500.0000 mg | DELAYED_RELEASE_TABLET | Freq: Two times a day (BID) | ORAL | Status: DC
  Administered 2015-11-03 – 2015-11-05 (×3): 500 mg via ORAL
  Filled 2015-11-03 (×4): qty 1

## 2015-11-03 MED ORDER — LORAZEPAM 0.5 MG PO TABS
0.5000 mg | ORAL_TABLET | Freq: Three times a day (TID) | ORAL | Status: DC
  Administered 2015-11-03 – 2015-11-05 (×6): 0.5 mg via ORAL
  Filled 2015-11-03 (×7): qty 1

## 2015-11-03 NOTE — Progress Notes (Signed)
Hemodialysis completed. 

## 2015-11-03 NOTE — Progress Notes (Signed)
Pre-hd tx 

## 2015-11-03 NOTE — Progress Notes (Signed)
Post hemodialysis tx 

## 2015-11-03 NOTE — Progress Notes (Signed)
Post hd tx 

## 2015-11-03 NOTE — Plan of Care (Signed)
Problem: Coping: Goal: Ability to demonstrate self-control will improve Outcome: Not Progressing No self control  Constantly in trusive

## 2015-11-03 NOTE — [facility] Group Notes (Signed)
[place] Group Notes:  (Nursing/MHT/Case Management/Adjunct)  Date:  11/03/2015  Time:  2:07 PM  Type of Therapy:  Psychoeducational Skills  Participation Level:  Did Not Attend   Charise Killian 11/03/2015, 2:07 PM

## 2015-11-03 NOTE — Progress Notes (Signed)
Hemodialysis start 

## 2015-11-03 NOTE — [facility] Group Notes (Signed)
[place] Group Notes:  (Nursing/MHT/Case Management/Adjunct)  Date:  11/03/2015  Time:  9:29 PM  Type of Therapy:  Group Therapy  Participation Level:  Active  Participation Quality:  Appropriate  Affect:  Appropriate  Cognitive:  Appropriate  Insight:  Appropriate  Engagement in Group:  Engaged  Modes of Intervention:  Activity  Summary of Progress/Problems:  Kandis Fantasia 11/03/2015, 9:29 PM

## 2015-11-03 NOTE — Progress Notes (Signed)
[facility]  ROUNDING NOTE   Subjective:   Seen and examined on hemodialysis. Tolerating treatment well.  Moving her access arm too much Has a lot of questions Required iv ativan  Asking about iv iron and increasing UF goal Requesting ibuprofen for pain  HEMODIALYSIS FLOWSHEET:  Blood Flow Rate (mL/min): 400 mL/min Arterial Pressure (mmHg): -250 mmHg Venous Pressure (mmHg): 140 mmHg Transmembrane Pressure (mmHg): 50 mmHg Ultrafiltration Rate (mL/min): 630 mL/min Dialysate Flow Rate (mL/min): 600 ml/min Conductivity: Machine : 14 Conductivity: Machine : 14 Dialysis Fluid Bolus: Normal Saline Bolus Amount (mL): 250 mL Intra-Hemodialysis Comments: 998ml...UF goal set to 1.5liters per order    Objective:  Vital signs in last 24 hours:  Temp:  [98.3 F (36.8 C)-98.4 F (36.9 C)] 98.3 F (36.8 C) (06/27 0915) Pulse Rate:  [67-84] 70 (06/27 1130) Resp:  [16-18] 16 (06/27 1130) BP: (104-147)/(62-88) 119/72 mmHg (06/27 1130) SpO2:  [97 %-100 %] 100 % (06/27 1130)  Weight change:  Filed Weights   10/28/15 1747 10/29/15 1430  Weight: 79.379 kg (175 lb) 79.3 kg (174 lb 13.2 oz)    Intake/Output: I/O last 3 completed shifts: In: 360 [P.O.:360] Out: -    Intake/Output this shift:     Physical Exam: General: NAD, sitting in chair  Head: Normocephalic, atraumatic. Moist oral mucosal membranes  Eyes: Anicteric,  Neck: Supple, trachea midline  Lungs:  Clear to auscultation  Heart: Regular rate and rhythm  Abdomen:  Soft, nontender,   Extremities: no peripheral edema.  Neurologic: Nonfocal, moving all four extremities  Skin: No lesions  Access: Right AVF    Basic Metabolic Panel:  Recent Labs Lab 10/27/15 1255 10/29/15 1219 10/31/15 0956 11/03/15 1007  NA 140 139 140 135  K 4.0 4.1 4.6 4.7  CL 105 102 104 100*  CO2 23 29 28 26   GLUCOSE 67 73 83 119*  BUN 35* 27* 32* 36*  CREATININE 5.66* 4.41* 5.10* 5.61*  CALCIUM 9.4 9.5 9.8 9.0  PHOS  --   5.0* 5.4* 5.3*    Liver Function Tests:  Recent Labs Lab 10/27/15 1255 10/29/15 1219 10/31/15 0956 11/03/15 1007  AST 17  --   --   --   ALT 10*  --   --   --   ALKPHOS 95  --   --   --   BILITOT 0.4  --   --   --   PROT 6.6  --   --   --   ALBUMIN 3.3* 3.5 3.4* 3.3*   No results for input(s): LIPASE, AMYLASE in the last 168 hours. No results for input(s): AMMONIA in the last 168 hours.  CBC:  Recent Labs Lab 10/27/15 1255 10/29/15 1218 10/31/15 0956 11/03/15 1007  WBC 4.8 4.2 4.1 3.7  NEUTROABS 2.8  --   --   --   HGB 8.4* 9.2* 9.0* 9.4*  HCT 27.4* 28.4* 28.0* 28.8*  MCV 96.5 97.7 97.0 96.9  PLT 279 236 220 185    Cardiac Enzymes: No results for input(s): CKTOTAL, CKMB, CKMBINDEX, TROPONINI in the last 168 hours.  BNP: Invalid input(s): POCBNP  CBG: No results for input(s): GLUCAP in the last 168 hours.  Microbiology: Results for orders placed or performed during the hospital encounter of 08/06/15  Blood culture (routine x 2)     Status: None   Collection Time: 08/06/15  9:03 AM  Result Value Ref Range Status   Specimen Description BLOOD LEFT HAND  Final   Special Requests  IN PEDIATRIC BOTTLE 1CC  Final   Culture NO GROWTH 5 DAYS  Final   Report Status 08/11/2015 FINAL  Final  Blood culture (routine x 2)     Status: None   Collection Time: 08/06/15  9:30 AM  Result Value Ref Range Status   Specimen Description BLOOD LEFT HAND  Final   Special Requests BOTTLES DRAWN AEROBIC AND ANAEROBIC 5CC  Final   Culture NO GROWTH 5 DAYS  Final   Report Status 08/11/2015 FINAL  Final  MRSA PCR Screening     Status: None   Collection Time: 08/06/15  6:25 PM  Result Value Ref Range Status   MRSA by PCR NEGATIVE NEGATIVE Final    Comment:        The GeneXpert MRSA Assay (FDA approved for NASAL specimens only), is one component of a comprehensive MRSA colonization surveillance program. It is not intended to diagnose MRSA infection nor to guide or monitor  treatment for MRSA infections.     Coagulation Studies: No results for input(s): LABPROT, INR in the last 72 hours.  Urinalysis: No results for input(s): COLORURINE, LABSPEC, PHURINE, GLUCOSEU, HGBUR, BILIRUBINUR, KETONESUR, PROTEINUR, UROBILINOGEN, NITRITE, LEUKOCYTESUR in the last 72 hours.  Invalid input(s): APPERANCEUR    Imaging: No results found.   Medications:     . cinacalcet  60 mg Oral Q breakfast  . divalproex  500 mg Oral BID AC & HS  . docusate sodium  100 mg Oral BID AC & HS  . doxepin  50 mg Oral QHS  . iron sucrose  100 mg Intravenous Once  . metoprolol tartrate  25 mg Oral BID AC & HS  . mometasone-formoterol  2 puff Inhalation BID  . nicotine  21 mg Transdermal Q0600  . pantoprazole  40 mg Oral Daily  . sevelamer carbonate  1,600 mg Oral TID WC  . simvastatin  10 mg Oral q1800  . ziprasidone  120 mg Oral Q supper   acetaminophen, albuterol, alum & mag hydroxide-simeth, ibuprofen, magnesium hydroxide  Assessment/ Plan:  Jennifer Lawson is a 55 y.o. black female with ESRD on HD since 1/16 followed by [facility], COPD, secondary hyperparathyroidism, anemia of CKD, bipolar affective disorder  1. ESRD on HD TTS:  Seen and examined on dialysis. Requiring sedation with ativan.   2. Anemia of CKD: hemoglobin 9.4 Mircera as outpatient.  EPO with HD treatment  3. Secondary hyperparathyroidism of renal origin:  - sevelamer for binding.  - cinacalcet  4. Hypertension: well controlled.  - metorpolol   LOS: 6 Jennifer Lawson 6/27/201711:38 AM

## 2015-11-03 NOTE — Progress Notes (Signed)
[facility] MD Progress Note  11/03/2015 1:49 PM Jennifer Lawson  MRN:  [id number]  Subjective:   Jennifer Lawson is loud, intrusive, disorganized, and agitated. She was troubled in dialysis today.  Depakote level is subtherapeutic. We will increase the dose.  Principal Problem: Bipolar affective disorder, current episode manic with psychotic symptoms ([place]) Diagnosis:   Patient Active Problem List   Diagnosis Date Noted  . Physical deconditioning [R53.81]   . Asthma [J45.909] 08/06/2015  . Cerebrovascular disease [I67.9] 07/23/2015  . Dyslipidemia [E78.5] 07/14/2015  . Bipolar affective disorder, current episode manic with psychotic symptoms ([place]) [F31.2] 07/13/2015  . Anemia of renal disease [D63.1] 01/26/2015  . CKD (chronic kidney disease) stage V requiring chronic dialysis ([place]) [N18.6, Z99.2] 05/13/2014  . COPD (chronic obstructive pulmonary disease) ([place]) [J44.9] 09/26/2013  . Hyperparathyroidism, primary ([place]) [E21.0] 02/20/2013  . Tobacco use disorder [F17.200] 11/13/2012  . HTN (hypertension) [I10] 02/20/2012   Total Time spent with patient: 20 minutes  Past Psychiatric History:  Bipolar disorder.  Past Medical History:  Past Medical History  Diagnosis Date  . Mental disorder   . Depression   . Hypertension   . Overdose   . Tobacco use disorder 11/13/2012  . Complication of anesthesia     difficulty going to sleep  . Chronic kidney disease     06/11/13- not on dialysis  . Shortness of breath     lying down flat  . PTSD (post-traumatic stress disorder)   . Asthma   . COPD (chronic obstructive pulmonary disease) ([place])   . Heart murmur   . GERD (gastroesophageal reflux disease)   . Seizures ([place])     "passsed out"  . History of blood transfusion   . Diabetes mellitus without complication [facility])     denies    Past Surgical History  Procedure Laterality Date  . Right knee replacement      she says it was last year.  . Esophagogastroduodenoscopy Left 11/14/2012   Procedure: ESOPHAGOGASTRODUODENOSCOPY (EGD);  Surgeon: [name], MD;  Location: [facility] ENDOSCOPY;  Service: Endoscopy;  Laterality: Left;  . Joint replacement Right     knee  . Parathyroidectomy    . Av fistula placement Right 06/12/2013    Procedure: ARTERIOVENOUS (AV) FISTULA CREATION; RIGHT  BASILIC VEIN TRANSPOSITION with Intraoperative ultrasound;  Surgeon: Mal [name], MD;  Location: [facility] OR;  Service: Vascular;  Laterality: Right;   Family History:  Family History  Problem Relation Age of Onset  . Diabetes Mother   . Hyperlipidemia Mother   . Hypertension Mother   . Diabetes Father   . Hypertension Father   . Hyperlipidemia Father    Family Psychiatric  History:  See H&P Social History:  History  Alcohol Use No    Comment: none in over a month per daughter     History  Drug Use No    Comment: pt denied any drug use    Social History   Social History  . Marital Status: Divorced    Spouse Name: N/A  . Number of Children: N/A  . Years of Education: N/A   Social History Main Topics  . Smoking status: Current Every Day Smoker -- 2.00 packs/day for 40 years    Types: Cigarettes, Cigars  . Smokeless tobacco: Never Used  . Alcohol Use: No     Comment: none in over a month per daughter  . Drug Use: No     Comment: pt denied any drug use  . Sexual Activity:  No   Other Topics Concern  . None   Social History Narrative   Additional Social History:                         Sleep: Fair  Appetite:  Fair  Current Medications: Current Facility-Administered Medications  Medication Dose Route Frequency Provider Last Rate Last Dose  . acetaminophen (TYLENOL) tablet 650 mg  650 mg Oral Q6H PRN Clovis Fredrickson, MD   650 mg at 11/03/15 1014  . albuterol (PROVENTIL HFA;VENTOLIN HFA) 108 (90 Base) MCG/ACT inhaler 2 puff  2 puff Inhalation Q6H PRN Shawndra Clute B Ryanna Teschner, MD      . alum & mag hydroxide-simeth (MAALOX/MYLANTA) 200-200-20 MG/5ML suspension 30 mL  30  mL Oral Q4H PRN Levana Minetti B Melvia Matousek, MD      . cinacalcet (SENSIPAR) tablet 60 mg  60 mg Oral Q breakfast Khadejah Son B Winston Misner, MD   60 mg at 11/03/15 1346  . divalproex (DEPAKOTE) DR tablet 500 mg  500 mg Oral BID AC & HS Greysen Swanton B Ladon Vandenberghe, MD      . docusate sodium (COLACE) capsule 100 mg  100 mg Oral BID AC & HS Marchelle Rinella B Kelin Nixon, MD   100 mg at 11/03/15 1347  . doxepin (SINEQUAN) capsule 50 mg  50 mg Oral QHS Clovis Fredrickson, MD   50 mg at 11/02/15 2143  . epoetin alfa (EPOGEN,PROCRIT) injection 4,000 Units  4,000 Units Intravenous Q T,Th,Sa-HD Murlean Iba, MD   4,000 Units at 11/03/15 1215  . ibuprofen (ADVIL,MOTRIN) tablet 400 mg  400 mg Oral Q6H PRN Murlean Iba, MD      . magnesium hydroxide (MILK OF MAGNESIA) suspension 30 mL  30 mL Oral Daily PRN Neosha Switalski B Pamlea Finder, MD      . metoprolol tartrate (LOPRESSOR) tablet 25 mg  25 mg Oral BID AC & HS Arlyn Bumpus B Saisha Hogue, MD   25 mg at 11/03/15 1348  . mometasone-formoterol (DULERA) 100-5 MCG/ACT inhaler 2 puff  2 puff Inhalation BID Clovis Fredrickson, MD   2 puff at 11/03/15 1349  . nicotine (NICODERM CQ - dosed in mg/24 hours) patch 21 mg  21 mg Transdermal Q0600 Clovis Fredrickson, MD   21 mg at 11/02/15 0913  . pantoprazole (PROTONIX) EC tablet 40 mg  40 mg Oral Daily Clovis Fredrickson, MD   40 mg at 11/02/15 0911  . sevelamer carbonate (RENVELA) tablet 1,600 mg  1,600 mg Oral TID WC Silvanna Ohmer B Zaliyah Meikle, MD   1,600 mg at 11/02/15 1727  . simvastatin (ZOCOR) tablet 10 mg  10 mg Oral q1800 Thi Klich B Jashawna Reever, MD   10 mg at 11/02/15 1727  . ziprasidone (GEODON) capsule 120 mg  120 mg Oral Q supper Clovis Fredrickson, MD   120 mg at 11/02/15 1727    Lab Results:  Results for orders placed or performed during the hospital encounter of 10/28/15 (from the past 48 hour(s))  Valproic acid level     Status: Abnormal   Collection Time: 11/03/15  7:02 AM  Result Value Ref Range   Valproic Acid Lvl 40 (L) 50.0 - 100.0  ug/mL  Renal function panel     Status: Abnormal   Collection Time: 11/03/15 10:07 AM  Result Value Ref Range   Sodium 135 135 - 145 mmol/L   Potassium 4.7 3.5 - 5.1 mmol/L   Chloride 100 (L) 101 - 111 mmol/L   CO2 26 22 - 32 mmol/L  Glucose, Bld 119 (H) 65 - 99 mg/dL   BUN 36 (H) 6 - 20 mg/dL   Creatinine, Ser 5.61 (H) 0.44 - 1.00 mg/dL   Calcium 9.0 8.9 - 10.3 mg/dL   Phosphorus 5.3 (H) 2.5 - 4.6 mg/dL   Albumin 3.3 (L) 3.5 - 5.0 g/dL   GFR calc non Af Amer 8 (L) >60 mL/min   GFR calc Af Amer 9 (L) >60 mL/min    Comment: (NOTE) The eGFR has been calculated using the CKD EPI equation. This calculation has not been validated in all clinical situations. eGFR's persistently <60 mL/min signify possible Chronic Kidney Disease.    Anion gap 9 5 - 15  CBC     Status: Abnormal   Collection Time: 11/03/15 10:07 AM  Result Value Ref Range   WBC 3.7 3.6 - 11.0 K/uL   RBC 2.97 (L) 3.80 - 5.20 MIL/uL   Hemoglobin 9.4 (L) 12.0 - 16.0 g/dL   HCT 28.8 (L) 35.0 - 47.0 %   MCV 96.9 80.0 - 100.0 fL   MCH 31.6 26.0 - 34.0 pg   MCHC 32.6 32.0 - 36.0 g/dL   RDW 19.6 (H) 11.5 - 14.5 %   Platelets 185 150 - 440 K/uL    Blood Alcohol level:  Lab Results  Component Value Date   ETH <5 10/27/2015   ETH <5 68/04/7516    Metabolic Disorder Labs: Lab Results  Component Value Date   HGBA1C 4.3 06/03/2015   MPG 97 05/14/2014   MPG 94 02/06/2014   Lab Results  Component Value Date   PROLACTIN 126.4* 06/03/2015   Lab Results  Component Value Date   CHOL 184 06/03/2015   TRIG 102 06/03/2015   HDL 65 06/03/2015   CHOLHDL 2.8 06/03/2015   VLDL 20 06/03/2015   LDLCALC 99 06/03/2015   LDLCALC 99 01/09/2015    Physical Findings: AIMS: Facial and Oral Movements Muscles of Facial Expression: None, normal Lips and Perioral Area: None, normal Jaw: None, normal Tongue: None, normal,Extremity Movements Upper (arms, wrists, hands, fingers): None, normal Lower (legs, knees, ankles, toes):  None, normal, Trunk Movements Neck, shoulders, hips: None, normal, Overall Severity Severity of abnormal movements (highest score from questions above): None, normal Incapacitation due to abnormal movements: None, normal Patient's awareness of abnormal movements (rate only patient's report): No Awareness, Dental Status Current problems with teeth and/or dentures?: Yes Does patient usually wear dentures?: No  CIWA:    COWS:     Musculoskeletal: Strength & Muscle Tone: within normal limits Gait & Station: normal Patient leans: N/A  Psychiatric Specialty Exam: Physical Exam  Nursing note and vitals reviewed.   Review of Systems  Musculoskeletal: Positive for myalgias.  Psychiatric/Behavioral: Positive for depression and hallucinations.  All other systems reviewed and are negative.   Blood pressure 107/63, pulse 74, temperature 98.8 F (37.1 C), temperature source Oral, resp. rate 16, height 5' (1.524 m), weight 79.3 kg (174 lb 13.2 oz), last menstrual period 05/09/2008, SpO2 98 %.Body mass index is 34.14 kg/(m^2).  General Appearance: Fairly Groomed  Eye Contact:  Good  Speech:  Pressured  Volume:  Increased  Mood:  Angry, Dysphoric and Irritable  Affect:  Congruent  Thought Process:  Disorganized  Orientation:  Full (Time, Place, and Person)  Thought Content:  Delusions and Paranoid Ideation  Suicidal Thoughts:  No  Homicidal Thoughts:  No  Memory:  Immediate;   Fair Recent;   Fair Remote;   Fair  Judgement:  Poor  Insight:  Lacking  Psychomotor Activity:  Increased  Concentration:  Concentration: Fair and Attention Span: Fair  Recall:  [employer] of Knowledge:  Fair  Language:  Fair  Akathisia:  No  Handed:  Right  AIMS (if indicated):     Assets:  Communication Skills Desire for Improvement Financial Resources/Insurance Housing Resilience Social Support  ADL's:  Intact  Cognition:  WNL  Sleep:  Number of Hours: 7.15     Treatment Plan Summary: Daily  contact with patient to assess and evaluate symptoms and progress in treatment and Medication management   Jennifer Lawson is a 55 year old female with history of bipolar disorder and end-stage kidney disease on dialysis admitted for a manic episode and aggressive behavior in the context of treatment noncompliance.  1. Bipolar mania. We restarted Depakote and Geodon for psychosis and mood stabilization. VPA level on 6/27 was 30. Will increase Depakote to 500 mg bid.  2. Kidney disease. Nephrology consult is greatly appreciated. The patient is continued on dialysis.  3. COPD. We continue inhalers, Albuterol and Dulera.  4. Hypertension. She is on metoprolol.   5. GERD. She is on Protonix.  6. Smoking. Nicotine patch is available.  7. Insomnia. We offered Doxepin.   8. Metabolic syndrome. Lipid panel, HgbA1C, TSH and PRL were done during prevoius admission.  9. Dyslipidemia. She is on Zocor.   10. Constipation. She is on Colace.   11. Chronic pain. The patient refuses all types of over-the-counter pain medication. We will consider offering fentanyl patch.   12. Disposition. She will be discharged to home with her daughter. She will follow up with Dr. Rosine Door and her nephrologist.  Orson Slick, MD 11/03/2015, 1:49 PM

## 2015-11-03 NOTE — Progress Notes (Signed)
D: Patient  Angry and agitated with staff . Escorted to Dialysis  Continued to act out , refusing to allow staff to  assess her port for  Treatment . Writer gave patient IM Ativan . Patient receptive. Patient remained in dialysis until finish . On returned to the unit  patient remained intrusive  And agitated . Demanding MD and social workers to find her  A place of her own . Patient repeatly came  Back and forth to  Nursing station . Staff attempted to help  Patient with menu . Patient refused  Again yelling at times not  Getting her way  With what she wanted.  A: Encourage patient participation with unit programming . Instruction  Given on  Medication , verbalize understanding. R: Voice no other concerns. Staff continue to monitor

## 2015-11-03 NOTE — [facility] Group Notes (Addendum)
[place] LCSW Group Therapy   11/03/2015 3:00PM  Type of Therapy: Group Therapy   Participation Level: Invited but did not attend.  Participation Quality: Invited but did not attend.   Emilie Rutter, LCSWA

## 2015-11-03 NOTE — Progress Notes (Signed)
Recreation Therapy Notes  Date: 06.27.17 Time: 9:30 am Location: Craft Room  Group Topic: Goal Setting  Goal Area(s) Addresses:  Patient will identify at least one goal. Patient will identify at least one obstacle.  Behavioral Response: Did not attend  Intervention: Recovery Goal Chart  Activity: Patients were instructed to make a Recovery Goal Chart including goals, obstacles, date they started working on their goal, and date they achieved their goal.  Education: LRT educated patients on healthy ways to celebrate reaching their goals.  Education Outcome: Patient did not attend group.  Clinical Observations/Feedback: Patient did not attend group.  Leonette Monarch, LRT/CTRS 11/03/2015 10:13 AM

## 2015-11-04 NOTE — Progress Notes (Signed)
D: Patient stated slept good last night .Stated appetite is good and energy level  Is high. Stated concentration is good . Stated on Depression scale , hopeless and anxiety .( low 0-10 high) Denies suicidal  homicidal ideations  .  No auditory hallucinations  No pain concerns . Appropriate ADL'S. Interacting with peers and staff.   Patient able to list chores on  Inventory listing  The current medical regimen is effective;  continue present plan and medications. To be intrusive and abrasive  To staff and peers .  Voice of wanting to go home . Continue to work on Radiographer, therapeutic. Informed of possible going home tomorrow .  A: Encourage patient participation with unit programming . Instruction  Given on  Medication , verbalize understanding. R: Voice no other concerns. Staff continue to monitor

## 2015-11-04 NOTE — Progress Notes (Signed)
Recreation Therapy Notes  Date: 06.28.17 Time: 9:30 am Location: Craft Room  Group Topic: Self-esteem  Goal Area(s) Addresses:  Patient will identify positive traits about self. Patient will verbalize benefit of having a healthy self-esteem.  Behavioral Response: Attentive, Left early  Intervention: I Am  Activity: Patients were given a worksheet with the letter I on it and instructed to write as many positive traits inside the letter.  Education: LRT educated patients on ways they can increase their self-esteem.  Education Outcome: Patient left before LRT educated group.  Clinical Observations/Feedback: Patient completed activity by writing positive things she wanted to have. Patient left group at approximately 10:00 am to use the restroom. Patient did not return to group.  Leonette Monarch, LRT/CTRS 11/04/2015 10:11 AM

## 2015-11-04 NOTE — Progress Notes (Signed)
[facility] MD Progress Note  11/04/2015 3:36 PM Jennifer Lawson  MRN:  [id number]  Subjective:  Jennifer Lawson is much more organized in her thinking today. She scored and collected. She is able to participate in discharge planning. She accepts medications and tolerates them well. There are no somatic complaints her pain improved with ibuprofen. She will receive another dialysis tomorrow and will be discharged with her family in the afternoon.  Principal Problem: Bipolar affective disorder, current episode manic with psychotic symptoms ([place]) Diagnosis:   Patient Active Problem List   Diagnosis Date Noted  . Physical deconditioning [R53.81]   . Asthma [J45.909] 08/06/2015  . Cerebrovascular disease [I67.9] 07/23/2015  . Dyslipidemia [E78.5] 07/14/2015  . Bipolar affective disorder, current episode manic with psychotic symptoms ([place]) [F31.2] 07/13/2015  . Anemia of renal disease [D63.1] 01/26/2015  . CKD (chronic kidney disease) stage V requiring chronic dialysis ([place]) [N18.6, Z99.2] 05/13/2014  . COPD (chronic obstructive pulmonary disease) ([place]) [J44.9] 09/26/2013  . Hyperparathyroidism, primary ([place]) [E21.0] 02/20/2013  . Tobacco use disorder [F17.200] 11/13/2012  . HTN (hypertension) [I10] 02/20/2012   Total Time spent with patient: 20 minutes  Past Psychiatric History: Bipolar disorder.  Past Medical History:  Past Medical History  Diagnosis Date  . Mental disorder   . Depression   . Hypertension   . Overdose   . Tobacco use disorder 11/13/2012  . Complication of anesthesia     difficulty going to sleep  . Chronic kidney disease     06/11/13- not on dialysis  . Shortness of breath     lying down flat  . PTSD (post-traumatic stress disorder)   . Asthma   . COPD (chronic obstructive pulmonary disease) ([place])   . Heart murmur   . GERD (gastroesophageal reflux disease)   . Seizures ([place])     "passsed out"  . History of blood transfusion   . Diabetes mellitus without complication  [facility])     denies    Past Surgical History  Procedure Laterality Date  . Right knee replacement      she says it was last year.  . Esophagogastroduodenoscopy Left 11/14/2012    Procedure: ESOPHAGOGASTRODUODENOSCOPY (EGD);  Surgeon: [name], MD;  Location: [facility];  Service: Endoscopy;  Laterality: Left;  . Joint replacement Right     knee  . Parathyroidectomy    . Av fistula placement Right 06/12/2013    Procedure: ARTERIOVENOUS (AV) FISTULA CREATION; RIGHT  BASILIC VEIN TRANSPOSITION with Intraoperative ultrasound;  Surgeon: Mal [name], MD;  Location: [facility] OR;  Service: Vascular;  Laterality: Right;   Family History:  Family History  Problem Relation Age of Onset  . Diabetes Mother   . Hyperlipidemia Mother   . Hypertension Mother   . Diabetes Father   . Hypertension Father   . Hyperlipidemia Father    Family Psychiatric  History: See H&P. Social History:  History  Alcohol Use No    Comment: none in over a month per daughter     History  Drug Use No    Comment: pt denied any drug use    Social History   Social History  . Marital Status: Divorced    Spouse Name: N/A  . Number of Children: N/A  . Years of Education: N/A   Social History Main Topics  . Smoking status: Current Every Day Smoker -- 2.00 packs/day for 40 years    Types: Cigarettes, Cigars  . Smokeless tobacco: Never Used  . Alcohol Use: No  Comment: none in over a month per daughter  . Drug Use: No     Comment: pt denied any drug use  . Sexual Activity: No   Other Topics Concern  . None   Social History Narrative   Additional Social History:                         Sleep: Fair  Appetite:  Fair  Current Medications: Current Facility-Administered Medications  Medication Dose Route Frequency Provider Last Rate Last Dose  . albuterol (PROVENTIL HFA;VENTOLIN HFA) 108 (90 Base) MCG/ACT inhaler 2 puff  2 puff Inhalation Q6H PRN Colby Catanese B Laryah Neuser, MD      . alum & mag  hydroxide-simeth (MAALOX/MYLANTA) 200-200-20 MG/5ML suspension 30 mL  30 mL Oral Q4H PRN Liyat Faulkenberry B Atwood Adcock, MD      . cinacalcet (SENSIPAR) tablet 60 mg  60 mg Oral Q breakfast Dequane Strahan B Guyla Bless, MD   60 mg at 11/04/15 0743  . divalproex (DEPAKOTE) DR tablet 500 mg  500 mg Oral BID AC & HS Alexzandra Bilton B Sian Rockers, MD   500 mg at 11/04/15 0743  . docusate sodium (COLACE) capsule 100 mg  100 mg Oral BID AC & HS Kristiann Noyce B Elisha Cooksey, MD   100 mg at 11/04/15 0743  . doxepin (SINEQUAN) capsule 50 mg  50 mg Oral QHS Kailyn Dubie B Shallon Yaklin, MD   50 mg at 11/03/15 2232  . epoetin alfa (EPOGEN,PROCRIT) injection 4,000 Units  4,000 Units Intravenous Q T,Th,Sa-HD Murlean Iba, MD   4,000 Units at 11/03/15 1215  . ibuprofen (ADVIL,MOTRIN) tablet 400 mg  400 mg Oral Q6H PRN Murlean Iba, MD      . ibuprofen (ADVIL,MOTRIN) tablet 600 mg  600 mg Oral Q6H PRN Clovis Fredrickson, MD   600 mg at 11/04/15 0742  . LORazepam (ATIVAN) tablet 0.5 mg  0.5 mg Oral TID AC Gervase Colberg B Jimi Schappert, MD   0.5 mg at 11/04/15 1213  . magnesium hydroxide (MILK OF MAGNESIA) suspension 30 mL  30 mL Oral Daily PRN Iseah Plouff B Anaiz Qazi, MD      . metoprolol tartrate (LOPRESSOR) tablet 25 mg  25 mg Oral BID AC & HS Audryanna Zurita B Domnick Chervenak, MD   25 mg at 11/04/15 0742  . mometasone-formoterol (DULERA) 100-5 MCG/ACT inhaler 2 puff  2 puff Inhalation BID Clovis Fredrickson, MD   2 puff at 11/04/15 0743  . nicotine (NICODERM CQ - dosed in mg/24 hours) patch 21 mg  21 mg Transdermal Q0600 Clovis Fredrickson, MD   21 mg at 11/04/15 0851  . pantoprazole (PROTONIX) EC tablet 40 mg  40 mg Oral Daily Clovis Fredrickson, MD   40 mg at 11/04/15 0743  . sevelamer carbonate (RENVELA) tablet 1,600 mg  1,600 mg Oral TID WC Neddie Steedman B Raylyn Speckman, MD   1,600 mg at 11/04/15 1213  . simvastatin (ZOCOR) tablet 10 mg  10 mg Oral q1800 Randal Yepiz B Elice Crigger, MD   10 mg at 11/03/15 1732  . ziprasidone (GEODON) capsule 120 mg  120 mg Oral Q supper Clovis Fredrickson, MD   120 mg at 11/03/15 1732    Lab Results:  Results for orders placed or performed during the Lawson encounter of 10/28/15 (from the past 48 hour(s))  Valproic acid level     Status: Abnormal   Collection Time: 11/03/15  7:02 AM  Result Value Ref Range   Valproic Acid Lvl 40 (L) 50.0 - 100.0 ug/mL  Renal  function panel     Status: Abnormal   Collection Time: 11/03/15 10:07 AM  Result Value Ref Range   Sodium 135 135 - 145 mmol/L   Potassium 4.7 3.5 - 5.1 mmol/L   Chloride 100 (L) 101 - 111 mmol/L   CO2 26 22 - 32 mmol/L   Glucose, Bld 119 (H) 65 - 99 mg/dL   BUN 36 (H) 6 - 20 mg/dL   Creatinine, Ser 5.61 (H) 0.44 - 1.00 mg/dL   Calcium 9.0 8.9 - 10.3 mg/dL   Phosphorus 5.3 (H) 2.5 - 4.6 mg/dL   Albumin 3.3 (L) 3.5 - 5.0 g/dL   GFR calc non Af Amer 8 (L) >60 mL/min   GFR calc Af Amer 9 (L) >60 mL/min    Comment: (NOTE) The eGFR has been calculated using the CKD EPI equation. This calculation has not been validated in all clinical situations. eGFR's persistently <60 mL/min signify possible Chronic Kidney Disease.    Anion gap 9 5 - 15  CBC     Status: Abnormal   Collection Time: 11/03/15 10:07 AM  Result Value Ref Range   WBC 3.7 3.6 - 11.0 K/uL   RBC 2.97 (L) 3.80 - 5.20 MIL/uL   Hemoglobin 9.4 (L) 12.0 - 16.0 g/dL   HCT 28.8 (L) 35.0 - 47.0 %   MCV 96.9 80.0 - 100.0 fL   MCH 31.6 26.0 - 34.0 pg   MCHC 32.6 32.0 - 36.0 g/dL   RDW 19.6 (H) 11.5 - 14.5 %   Platelets 185 150 - 440 K/uL    Blood Alcohol level:  Lab Results  Component Value Date   ETH <5 10/27/2015   ETH <5 56/38/9373    Metabolic Disorder Labs: Lab Results  Component Value Date   HGBA1C 4.3 06/03/2015   MPG 97 05/14/2014   MPG 94 02/06/2014   Lab Results  Component Value Date   PROLACTIN 126.4* 06/03/2015   Lab Results  Component Value Date   CHOL 184 06/03/2015   TRIG 102 06/03/2015   HDL 65 06/03/2015   CHOLHDL 2.8 06/03/2015   VLDL 20 06/03/2015   LDLCALC 99  06/03/2015   LDLCALC 99 01/09/2015    Physical Findings: AIMS: Facial and Oral Movements Muscles of Facial Expression: None, normal Lips and Perioral Area: None, normal Jaw: None, normal Tongue: None, normal,Extremity Movements Upper (arms, wrists, hands, fingers): None, normal Lower (legs, knees, ankles, toes): None, normal, Trunk Movements Neck, shoulders, hips: None, normal, Overall Severity Severity of abnormal movements (highest score from questions above): None, normal Incapacitation due to abnormal movements: None, normal Patient's awareness of abnormal movements (rate only patient's report): No Awareness, Dental Status Current problems with teeth and/or dentures?: Yes Does patient usually wear dentures?: No  CIWA:    COWS:     Musculoskeletal: Strength & Muscle Tone: within normal limits Gait & Station: unsteady Patient leans: N/A  Psychiatric Specialty Exam: Physical Exam  Nursing note and vitals reviewed.   Review of Systems  Psychiatric/Behavioral: The patient is nervous/anxious.   All other systems reviewed and are negative.   Blood pressure 135/88, pulse 79, temperature 98.1 F (36.7 C), temperature source Oral, resp. rate 16, height 5' (1.524 m), weight 79.3 kg (174 lb 13.2 oz), last menstrual period 05/09/2008, SpO2 98 %.Body mass index is 34.14 kg/(m^2).  General Appearance: Casual  Eye Contact:  Good  Speech:  Clear and Coherent  Volume:  Normal  Mood:  Anxious  Affect:  Congruent  Thought Process:  Goal Directed  Orientation:  Full (Time, Place, and Person)  Thought Content:  WDL  Suicidal Thoughts:  No  Homicidal Thoughts:  No  Memory:  Immediate;   Fair Recent;   Fair Remote;   Fair  Judgement:  Poor  Insight:  Lacking  Psychomotor Activity:  Normal  Concentration:  Concentration: Fair and Attention Span: Fair  Recall:  [employer] of Knowledge:  Fair  Language:  Fair  Akathisia:  No  Handed:  Right  AIMS (if indicated):     Assets:   Communication Skills Desire for Improvement Financial Resources/Insurance Housing Resilience Social Support  ADL's:  Intact  Cognition:  WNL  Sleep:  Number of Hours: 6.5     Treatment Plan Summary: Daily contact with patient to assess and evaluate symptoms and progress in treatment and Medication management   Ms. Hanna is a 55 year old female with history of bipolar disorder and end-stage kidney disease on dialysis admitted for a manic episode and aggressive behavior in the context of treatment noncompliance.  1. Bipolar mania. We restarted Depakote and Geodon for psychosis and mood stabilization. VPA level 40. Will increase Depakote to 500 mg bid. Level in am.  2. Kidney disease. Nephrology consult is greatly appreciated. The patient is continued on dialysis.  3. COPD. We continue inhalers, Albuterol and Dulera.  4. Hypertension. She is on metoprolol.   5. GERD. She is on Protonix.  6. Smoking. Nicotine patch is available.  7. Insomnia. We continued Doxepin.   8. Metabolic syndrome. Lipid panel, HgbA1C, TSH and PRL were done during prevoius admission.  9. Dyslipidemia. She is on Zocor.   10. Constipation. She is on Colace.   11. Chronic pain. The patient abuses all types of over-the-counter pain medication. We offered Motrin for aches and pains.  12. Disposition. She will be discharged to home with her daughter. She will follow up with Dr. Rosine Door and her nephrologist.  Orson Slick, MD 11/04/2015, 3:36 PM

## 2015-11-04 NOTE — BHH Group Notes (Signed)
[facility] Group Notes:  (Nursing/MHT/Case Management/Adjunct)  Date:  11/04/2015  Time:  4:10 PM  Type of Therapy:  Psychoeducational Skills  Participation Level:  Did Not Attend  Adela Lank [facility] 11/04/2015, 4:10 PM

## 2015-11-04 NOTE — Plan of Care (Signed)
Problem: Coping: Goal: Ability to cope will improve Outcome: Progressing Developing coping skills

## 2015-11-04 NOTE — BHH Group Notes (Signed)
[facility] LCSW Group Therapy   11/04/2015  1:00pm   Type of Therapy: Group Therapy   Participation Level: Active   Participation Quality: Attentive, Sharing and Supportive   Affect: Appropriate  Cognitive: Alert and Oriented   Insight: Developing/Improving and Engaged   Engagement in Therapy: Developing/Improving and Engaged   Modes of Intervention: Clarification, Confrontation, Discussion, Education, Exploration, Limit-setting, Orientation, Problem-solving, Rapport Building, Art therapist, Socialization and Support   Summary of Progress/Problems: The topic for group today was emotional regulation. This group focused on both positive and negative emotion identification and allowed  group members to process ways to identify feelings, regulate negative emotions, and find healthy ways to manage internal/external emotions. Group members were asked to reflect on a time when their reaction to an emotion led to a negative outcome and explored how alternative responses using emotion regulation would have benefited them. Group members were also asked to discuss a time when emotion regulation was utilized when a negative emotion was experienced. Pt has good insight on emotion regulation. Pt believes that being admitted to the hospital has created an fearful emotion. Surrounding herself with nature and grandchildren could help regulate those negative emotions.    Emilie Rutter, MSW, [place] [place]

## 2015-11-04 NOTE — Tx Team (Signed)
Interdisciplinary Treatment Plan Update (Adult)  Date:  11/04/2015 Time Reviewed:  4:36 PM  Progress in Treatment: Attending groups: No. Participating in groups:  No. Taking medication as prescribed:  Yes. Tolerating medication:  Yes. Family/Significant othe contact made:  Yes, individual(s) contacted:  daughter  Patient understands diagnosis:  No. and As evidenced by:  limited insight  Discussing patient identified problems/goals with staff:  Yes. Medical problems stabilized or resolved:  Yes. Denies suicidal/homicidal ideation: Yes. Issues/concerns per patient self-inventory:  Yes. Other:  New problem(s) identified: No, Describe:  NA  Discharge Plan or Barriers: Pt plans to return home and follow up with outpatient.    Reason for Continuation of Hospitalization: Aggression Anxiety Mania Medication stabilization  Comments: Ms. Fuhs is much more organized in her thinking today. She scored and collected. She is able to participate in discharge planning. She accepts medications and tolerates them well. There are no somatic complaints her pain improved with ibuprofen. She will receive another dialysis tomorrow and will be discharged with her family in the afternoon.  Estimated length of stay: 1 day   New goal(s): Na  Review of initial/current patient goals per problem list:   1.  Goal(s): Patient will participate in aftercare plan * Met: Yes * Target date: at discharge * As evidenced by: Patient will participate within aftercare plan AEB aftercare provider and housing plan at discharge being identified.   2.  Goal (s): Patient will demonstrate decreased symptoms of mania. * Met:Yes *  Target date: at discharge * As evidenced by: Patient will not endorse signs of mania or be deemed stable for discharge by MD.   Attendees: Patient:  Jennifer Lawson 6/28/20174:36 PM  Family:   6/28/20174:36 PM  Physician:   Dr. Bary Leriche  6/28/20174:36 PM  Nursing:   Meredith Mody, RN   6/28/20174:36 PM  Case Manager:   6/28/20174:36 PM  Counselor:   6/28/20174:36 PM  Other:  [name] 6/28/20174:36 PM  Other:   6/28/20174:36 PM  Other:   6/28/20174:36 PM  Other:  6/28/20174:36 PM  Other:  6/28/20174:36 PM  Other:  6/28/20174:36 PM  Other:  6/28/20174:36 PM  Other:  6/28/20174:36 PM  Other:  6/28/20174:36 PM  Other:   6/28/20174:36 PM   Scribe for Treatment Team:   Wray Kearns, MSW, [place]  11/04/2015, 4:36 PM

## 2015-11-04 NOTE — BHH Suicide Risk Assessment (Addendum)
[facility] Discharge Suicide Risk Assessment   Principal Problem: Bipolar affective disorder, current episode manic with psychotic symptoms [facility]) Discharge Diagnoses:  Patient Active Problem List   Diagnosis Date Noted  . Physical deconditioning [R53.81]   . Asthma [J45.909] 08/06/2015  . Cerebrovascular disease [I67.9] 07/23/2015  . Dyslipidemia [E78.5] 07/14/2015  . Bipolar affective disorder, current episode manic with psychotic symptoms ([place]) [F31.2] 07/13/2015  . Anemia of renal disease [D63.1] 01/26/2015  . CKD (chronic kidney disease) stage V requiring chronic dialysis ([place]) [N18.6, Z99.2] 05/13/2014  . COPD (chronic obstructive pulmonary disease) ([place]) [J44.9] 09/26/2013  . Hyperparathyroidism, primary ([place]) [E21.0] 02/20/2013  . Tobacco use disorder [F17.200] 11/13/2012  . HTN (hypertension) [I10] 02/20/2012    Total Time spent with patient: 30 minutes  Musculoskeletal: Strength & Muscle Tone: within normal limits Gait & Station: unsteady Patient leans: N/A  Psychiatric Specialty Exam: Review of Systems  Psychiatric/Behavioral: The patient is nervous/anxious.   All other systems reviewed and are negative.   Blood pressure 135/88, pulse 79, temperature 98.1 F (36.7 C), temperature source Oral, resp. rate 16, height 5' (1.524 m), weight 79.3 kg (174 lb 13.2 oz), last menstrual period 05/09/2008, SpO2 98 %.Body mass index is 34.14 kg/(m^2).  General Appearance: Casual  Eye Contact::  Good  Speech:  Clear and [id number]  Volume:  Normal  Mood:  Anxious  Affect:  Congruent  Thought Process:  Goal Directed  Orientation:  Full (Time, Place, and Person)  Thought Content:  WDL  Suicidal Thoughts:  No  Homicidal Thoughts:  No  Memory:  Immediate;   Fair Recent;   Fair Remote;   Fair  Judgement:  Poor  Insight:  Lacking  Psychomotor Activity:  Normal  Concentration:  Fair  Recall:  [employer] of [place]  Language: Fair  Akathisia:  No  Handed:  Right  AIMS (if  indicated):     Assets:  Communication Skills Desire for Improvement Financial Resources/Insurance Housing Resilience Social Support  Sleep:  Number of Hours: 6.5  Cognition: WNL  ADL's:  Intact   Mental Status Per Nursing Assessment::   On Admission:     Demographic Factors:  Low socioeconomic status  Loss Factors: Decline in physical health  Historical Factors: Impulsivity  Risk Reduction Factors:   Responsible for children under 29 years of age, Sense of responsibility to family, Living with another person, especially a relative, Positive social support and Positive therapeutic relationship  Continued Clinical Symptoms:  Bipolar Disorder:   Mixed State  Cognitive Features That Contribute To Risk:  None    Suicide Risk:  Minimal: No identifiable suicidal ideation.  Patients presenting with no risk factors but with morbid ruminations; may be classified as minimal risk based on the severity of the depressive symptoms  Follow-up Information    Follow up with [facility].   Why:  Dr. Marshell Garfinkel information:   NOT COMPLETE      Plan Of Care/Follow-up recommendations:  Activity:  As tolerated. Diet:  Low sodium heart healthy. Other:  Keep follow-up appointments.  Orson Slick, MD 11/04/2015, 3:25 PM

## 2015-11-04 NOTE — Progress Notes (Signed)
D: Pt denies SI/HI/AVH, affect is flat, sad and blunted, Mood is angry, irritable and hostile towards staff.  Pt is S/P dialysis VS WNL, no bizarre behavior noted. Patient appears less anxious during group time and was interacting with peers and staff appropriately.  A: Pt was offered support and encouragement. Pt was given scheduled medications. Pt was encouraged to attend groups. Q 15 minute checks were done for safety.  R: Pt attends groups and interacts well with peers and staff. Pt is taking medication. Pt is not  receptive to treatment.  Safety maintained on unit.

## 2015-11-05 LAB — VALPROIC ACID LEVEL: Valproic Acid Lvl: 24 ug/mL — ABNORMAL LOW (ref 50.0–100.0)

## 2015-11-05 NOTE — Progress Notes (Signed)
  [facility] Adult Case Management Discharge Plan :  Will you be returning to the same living situation after discharge:  Yes,  pt will be returning home to [facility] to live with her daughter At discharge, do you have transportation home?: Yes,  pt will be picked up by her daughter Do you have the ability to pay for your medications: Yes,  pt will be provided with prescriptions at discharge  Release of information consent forms completed and in the chart;  Patient's signature needed at discharge.  Patient to Follow up at: Follow-up Information    Follow up with [facility] . Go on 11/09/2015.   Why:  Your hospital follow up appointment and re-assessment for medication management and therapy is on Monday, July 3rd at 3:00pm.    Contact information:   [address].  [place], [place]  Phone: [phone] Fax: [phone]      Call [facility] ACT Team.   Why:  You have been referred to an ACT team for medication management and therapy in the home. They will call you to schedule a time to meet you. If you do not hear from them in 1 week, please give them a call.    Contact information:   [id number] [place], [place]  Phone: [phone] Fax: [phone]      Next level of care provider has access to [place] and Suicide Prevention discussed: Yes,  completed with pt  Have you used any form of tobacco in the last 30 days? (Cigarettes, Smokeless Tobacco, Cigars, and/or Pipes): Yes  Has patient been referred to the Quitline?: Patient refused referral  Patient has been referred for addiction treatment: N/A  Alphonse Guild Mayre B[place] 11/05/2015, 10:51 PM

## 2015-11-05 NOTE — BHH Group Notes (Signed)
[facility] Group Notes:  (Nursing/MHT/Case Management/Adjunct)  Date:  11/05/2015  Time:  4:21 AM  Type of Therapy:  Group Therapy  Participation Level:  Did Not Attend   Summary of Progress/Problems:  Marylynn Pearson 11/05/2015, 4:21 AM

## 2015-11-05 NOTE — BHH Group Notes (Signed)
[facility] LCSW Group Therapy   11/05/2015 1:0 pm   Type of Therapy: Group Therapy   Participation Level: Invited but did not attend. Participation Quality: Invited but did not attend.      Emilie Rutter, LCSW-A  11/05/2015 1:52pm

## 2015-11-05 NOTE — Progress Notes (Signed)
[facility]  ROUNDING NOTE   Subjective:   Seen and examined on hemodialysis. Tolerating treatment well.    HEMODIALYSIS FLOWSHEET:  Blood Flow Rate (mL/min): 400 mL/min Arterial Pressure (mmHg): 170 mmHg Venous Pressure (mmHg): -170 mmHg Transmembrane Pressure (mmHg): 50 mmHg Ultrafiltration Rate (mL/min): 570 mL/min Dialysate Flow Rate (mL/min): 800 ml/min Conductivity: Machine : 14 Conductivity: Machine : 14 Dialysis Fluid Bolus: Normal Saline Bolus Amount (mL): 250 mL Intra-Hemodialysis Comments: 512ML (PT WATCHING TV)    Objective:  Vital signs in last 24 hours:  Temp:  [98.2 F (36.8 C)-98.4 F (36.9 C)] 98.2 F (36.8 C) (06/29 0935) Pulse Rate:  [74-84] 76 (06/29 1030) Resp:  [16-18] 18 (06/29 0935) BP: (121-147)/(65-92) 125/82 mmHg (06/29 1030) SpO2:  [100 %] 100 % (06/29 1000) Weight:  [79.3 kg (174 lb 13.2 oz)] 79.3 kg (174 lb 13.2 oz) (06/29 0935)  Weight change:  Filed Weights   10/28/15 1747 10/29/15 1430 11/05/15 0935  Weight: 79.379 kg (175 lb) 79.3 kg (174 lb 13.2 oz) 79.3 kg (174 lb 13.2 oz)    Intake/Output: I/O last 3 completed shifts: In: 120 [P.O.:120] Out: -    Intake/Output this shift:  Total I/O In: 120 [P.O.:120] Out: -   Physical Exam: General: NAD, sitting in chair  Head: Normocephalic, atraumatic. Moist oral mucosal membranes  Eyes: Anicteric,  Neck: Supple, trachea midline  Lungs:  Clear to auscultation  Heart: Regular rate and rhythm  Abdomen:  Soft, nontender,   Extremities: no peripheral edema.  Neurologic: Nonfocal, moving all four extremities  Skin: No lesions  Access: Right AVF    Basic Metabolic Panel:  Recent Labs Lab 10/29/15 1219 10/31/15 0956 11/03/15 1007  NA 139 140 135  K 4.1 4.6 4.7  CL 102 104 100*  CO2 29 28 26   GLUCOSE 73 83 119*  BUN 27* 32* 36*  CREATININE 4.41* 5.10* 5.61*  CALCIUM 9.5 9.8 9.0  PHOS 5.0* 5.4* 5.3*    Liver Function Tests:  Recent Labs Lab  10/29/15 1219 10/31/15 0956 11/03/15 1007  ALBUMIN 3.5 3.4* 3.3*   No results for input(s): LIPASE, AMYLASE in the last 168 hours. No results for input(s): AMMONIA in the last 168 hours.  CBC:  Recent Labs Lab 10/29/15 1218 10/31/15 0956 11/03/15 1007  WBC 4.2 4.1 3.7  HGB 9.2* 9.0* 9.4*  HCT 28.4* 28.0* 28.8*  MCV 97.7 97.0 96.9  PLT 236 220 185    Cardiac Enzymes: No results for input(s): CKTOTAL, CKMB, CKMBINDEX, TROPONINI in the last 168 hours.  BNP: Invalid input(s): POCBNP  CBG: No results for input(s): GLUCAP in the last 168 hours.  Microbiology: Results for orders placed or performed during the hospital encounter of 08/06/15  Blood culture (routine x 2)     Status: None   Collection Time: 08/06/15  9:03 AM  Result Value Ref Range Status   Specimen Description BLOOD LEFT HAND  Final   Special Requests IN PEDIATRIC BOTTLE 1CC  Final   Culture NO GROWTH 5 DAYS  Final   Report Status 08/11/2015 FINAL  Final  Blood culture (routine x 2)     Status: None   Collection Time: 08/06/15  9:30 AM  Result Value Ref Range Status   Specimen Description BLOOD LEFT HAND  Final   Special Requests BOTTLES DRAWN AEROBIC AND ANAEROBIC 5CC  Final   Culture NO GROWTH 5 DAYS  Final   Report Status 08/11/2015 FINAL  Final  MRSA PCR Screening  Status: None   Collection Time: 08/06/15  6:25 PM  Result Value Ref Range Status   MRSA by PCR NEGATIVE NEGATIVE Final    Comment:        The GeneXpert MRSA Assay (FDA approved for NASAL specimens only), is one component of a comprehensive MRSA colonization surveillance program. It is not intended to diagnose MRSA infection nor to guide or monitor treatment for MRSA infections.     Coagulation Studies: No results for input(s): LABPROT, INR in the last 72 hours.  Urinalysis: No results for input(s): COLORURINE, LABSPEC, PHURINE, GLUCOSEU, HGBUR, BILIRUBINUR, KETONESUR, PROTEINUR, UROBILINOGEN, NITRITE, LEUKOCYTESUR in the  last 72 hours.  Invalid input(s): APPERANCEUR    Imaging: No results found.   Medications:     . cinacalcet  60 mg Oral Q breakfast  . divalproex  500 mg Oral BID AC & HS  . docusate sodium  100 mg Oral BID AC & HS  . doxepin  50 mg Oral QHS  . epoetin (EPOGEN/PROCRIT) injection  4,000 Units Intravenous Q T,Th,Sa-HD  . LORazepam  0.5 mg Oral TID AC  . metoprolol tartrate  25 mg Oral BID AC & HS  . mometasone-formoterol  2 puff Inhalation BID  . nicotine  21 mg Transdermal Q0600  . pantoprazole  40 mg Oral Daily  . sevelamer carbonate  1,600 mg Oral TID WC  . simvastatin  10 mg Oral q1800  . ziprasidone  120 mg Oral Q supper   albuterol, alum & mag hydroxide-simeth, ibuprofen, ibuprofen, magnesium hydroxide  Assessment/ Plan:  Jennifer Lawson is a 55 y.o. black female with ESRD on HD since 1/16 followed by [facility], COPD, secondary hyperparathyroidism, anemia of CKD, bipolar affective disorder  1. ESRD on HD TTS:  Seen and examined on dialysis.    2. Anemia of CKD: hemoglobin 9.4 Mircera as outpatient.  EPO with HD treatment Iv iron given with last HD treatment  3. Secondary hyperparathyroidism of renal origin:  - sevelamer for binding.  - cinacalcet  4. Hypertension: well controlled.  - metorpolol   LOS: 8 Jennifer Lawson 6/29/201710:58 AM

## 2015-11-05 NOTE — Progress Notes (Signed)
  [facility] Adult Case Management Discharge Plan :  Will you be returning to the same living situation after discharge:  Yes,  home with daughter  At discharge, do you have transportation home?: Yes,  daughter will pick her up Do you have the ability to pay for your medications: Yes,  Medicare and disability  Release of information consent forms completed and in the chart;  Patient's signature needed at discharge.  Patient to Follow up at: Follow-up Information    Follow up with [facility] . Go on 11/09/2015.   Why:  Your hospital follow up appointment and re-assessment for medication management and therapy is on Monday, July 3rd at 3:00pm.    Contact information:   [address].  [place], [place]  Phone: [phone] Fax: [phone]      Call [facility] ACT Team.   Why:  You have been referred to an ACT team for medication management and therapy in the home. They will call you to schedule a time to meet you. If you do not hear from them in 1 week, please give them a call.    Contact information:   [id number] [place], [place]  Phone: [phone] Fax: [phone]      Next level of care provider has access to [place] and Suicide Prevention discussed: Yes,  completed with daughter  Have you used any form of tobacco in the last 30 days? (Cigarettes, Smokeless Tobacco, Cigars, and/or Pipes): Yes  Has patient been referred to the Quitline?: Patient refused referral  Patient has been referred for addiction treatment: N/A - no addiction treatment needed.  Emilie Rutter, LCSW-A 11/05/2015, 2:14 PM

## 2015-11-05 NOTE — Tx Team (Signed)
Interdisciplinary Treatment Plan Update (Adult)  Date:  11/05/2015 Time Reviewed:  10:49 PM  Progress in Treatment: Attending groups: No. Participating in groups:  No. Taking medication as prescribed:  Yes. Tolerating medication:  Yes. Family/Significant othe contact made:  Yes, individual(s) contacted:  daughter  Patient understands diagnosis:  No. and As evidenced by:  limited insight  Discussing patient identified problems/goals with staff:  Yes. Medical problems stabilized or resolved:  Yes. Denies suicidal/homicidal ideation: Yes. Issues/concerns per patient self-inventory:  Yes. Other:  New problem(s) identified: No, Describe:  NA  Discharge Plan or Barriers: Pt plans to return home and follow up with outpatient.    Reason for Continuation of Hospitalization: Aggression Anxiety Mania Medication stabilization  Comments: Jennifer Lawson is much more organized in her thinking today. She scored and collected. She is able to participate in discharge planning. She accepts medications and tolerates them well. There are no somatic complaints her pain improved with ibuprofen. She will receive another dialysis tomorrow and will be discharged with her family in the afternoon.  Estimated length of stay: 1 day   New goal(s): Na  Review of initial/current patient goals per problem list:   1.  Goal(s): Patient will participate in aftercare plan * Met: Adequate for discharge per MD. *  Target date: at discharge * As evidenced by: Patient will participate within aftercare plan AEB aftercare provider and housing plan at discharge being identified. * 6/29: Adequate for discharge per MD. *   2.  Goal (s): Patient will demonstrate decreased symptoms of mania. * Met: Adequate for discharge per MD. *   Target date: at discharge * As evidenced by: Patient will not endorse signs of mania or be deemed stable for discharge by MD.  * 6/29: Adequate for discharge per MD.  Attendees: Patient:   Jennifer Lawson 6/29/201710:49 PM  Family:   6/29/201710:49 PM  Physician:   Dr. Bary Leriche  6/29/201710:49 PM  Nursing:   Meredith Mody, RN  6/29/201710:49 PM  Case Manager:   6/29/201710:49 PM  Counselor:   6/29/201710:49 PM  Other:  Alphonse Guild Elishua Radford ,LCSWA 6/29/201710:49 PM  Other:   6/29/201710:49 PM  Other:   6/29/201710:49 PM  Other:  6/29/201710:49 PM  Other:  6/29/201710:49 PM  Other:  6/29/201710:49 PM  Other:  6/29/201710:49 PM  Other:  6/29/201710:49 PM  Other:  6/29/201710:49 PM  Other:   6/29/201710:49 PM   Scribe for Treatment Team:   Claudine Mouton, MSW, LCSWA  11/05/2015, 10:49 PM

## 2015-11-05 NOTE — Progress Notes (Signed)
Recreation Therapy Notes  Date: 06.29.17 Time: 9:30 am Location: Craft Room  Group Topic: Leisure Education  Goal Area(s) Addresses:  Patient will identify activities for each letter of the alphabet. Patient will verbalize ability to use leisure as a Technical sales engineer.  Behavioral Response: Did not attend  Intervention: Leisure Alphabet  Activity: Patients were given a Leisure Air traffic controller and instructed to identify a leisure activity for each letter of the alphabet.  Education: LRT educated patient on what they need to participate in leisure.  Education Outcome: Patient did not attend group.   Clinical Observations/Feedback: Patient did not attend group.  Leonette Monarch, LRT/CTRS 11/05/2015 10:18 AM
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
## 2015-11-05 NOTE — Progress Notes (Signed)
D: Pt denies SI/HI/AVH. Pt is irritable and angry, affect is flat and sad. Patient is not interacting with peers and staff appropriately.  A: Pt was offered support and encouragement. Pt was offered scheduled medications. Pt was encouraged to attend groups. Q 15 minute checks were done for safety.  R:Pt did not attend group. Pt is refused medication. Pt is not receptive to treatment and safety maintained on unit.

## 2015-11-05 NOTE — Progress Notes (Signed)
D:Patient aware of discharge this shift . Patient returning home . Patient received all belonging locked up . Patient denies  Suicidal  And homicidal ideations  .  A: Writer instructed on discharge criteria  . Informed  Of medication  and  To follow up with her doctor no prescriptions  were given Instructed on Discharge Summary AVS Form  And Suicidal Risk Form . Aware  Of follow up appointments . R: Patient left unit with no questions  Or concerns  With daugther

## 2015-11-23 ENCOUNTER — Encounter (HOSPITAL_COMMUNITY): Payer: Self-pay | Admitting: *Deleted

## 2015-11-23 ENCOUNTER — Inpatient Hospital Stay (HOSPITAL_COMMUNITY)
Admission: EM | Admit: 2015-11-23 | Discharge: 2015-12-01 | DRG: 377 | Disposition: A | Discharge status: Home / Self Care | Payer: Medicare Other | Attending: Internal Medicine | Admitting: Internal Medicine

## 2015-11-23 ENCOUNTER — Emergency Department (HOSPITAL_COMMUNITY): Payer: Medicare Other

## 2015-11-23 DIAGNOSIS — A419 Sepsis, unspecified organism: Secondary | ICD-10-CM | POA: Diagnosis not present

## 2015-11-23 DIAGNOSIS — J449 Chronic obstructive pulmonary disease, unspecified: Secondary | ICD-10-CM | POA: Diagnosis present

## 2015-11-23 DIAGNOSIS — D509 Iron deficiency anemia, unspecified: Secondary | ICD-10-CM | POA: Diagnosis present

## 2015-11-23 DIAGNOSIS — K922 Gastrointestinal hemorrhage, unspecified: Secondary | ICD-10-CM

## 2015-11-23 DIAGNOSIS — K2210 Ulcer of esophagus without bleeding: Secondary | ICD-10-CM | POA: Diagnosis present

## 2015-11-23 DIAGNOSIS — Z96651 Presence of right artificial knee joint: Secondary | ICD-10-CM | POA: Diagnosis present

## 2015-11-23 DIAGNOSIS — Z885 Allergy status to narcotic agent status: Secondary | ICD-10-CM

## 2015-11-23 DIAGNOSIS — D638 Anemia in other chronic diseases classified elsewhere: Secondary | ICD-10-CM | POA: Diagnosis present

## 2015-11-23 DIAGNOSIS — D62 Acute posthemorrhagic anemia: Secondary | ICD-10-CM | POA: Diagnosis present

## 2015-11-23 DIAGNOSIS — I10 Essential (primary) hypertension: Secondary | ICD-10-CM | POA: Diagnosis present

## 2015-11-23 DIAGNOSIS — N2581 Secondary hyperparathyroidism of renal origin: Secondary | ICD-10-CM | POA: Diagnosis present

## 2015-11-23 DIAGNOSIS — E669 Obesity, unspecified: Secondary | ICD-10-CM | POA: Diagnosis present

## 2015-11-23 DIAGNOSIS — E1122 Type 2 diabetes mellitus with diabetic chronic kidney disease: Secondary | ICD-10-CM | POA: Diagnosis present

## 2015-11-23 DIAGNOSIS — Z6833 Body mass index (BMI) 33.0-33.9, adult: Secondary | ICD-10-CM

## 2015-11-23 DIAGNOSIS — F17210 Nicotine dependence, cigarettes, uncomplicated: Secondary | ICD-10-CM | POA: Diagnosis present

## 2015-11-23 DIAGNOSIS — R000 Tachycardia, unspecified: Secondary | ICD-10-CM | POA: Diagnosis present

## 2015-11-23 DIAGNOSIS — E871 Hypo-osmolality and hyponatremia: Secondary | ICD-10-CM | POA: Diagnosis present

## 2015-11-23 DIAGNOSIS — D122 Benign neoplasm of ascending colon: Secondary | ICD-10-CM | POA: Diagnosis present

## 2015-11-23 DIAGNOSIS — K5730 Diverticulosis of large intestine without perforation or abscess without bleeding: Secondary | ICD-10-CM | POA: Diagnosis present

## 2015-11-23 DIAGNOSIS — K640 First degree hemorrhoids: Secondary | ICD-10-CM | POA: Diagnosis present

## 2015-11-23 DIAGNOSIS — J45909 Unspecified asthma, uncomplicated: Secondary | ICD-10-CM | POA: Diagnosis present

## 2015-11-23 DIAGNOSIS — K909 Intestinal malabsorption, unspecified: Secondary | ICD-10-CM | POA: Diagnosis present

## 2015-11-23 DIAGNOSIS — F319 Bipolar disorder, unspecified: Secondary | ICD-10-CM | POA: Diagnosis present

## 2015-11-23 DIAGNOSIS — T39395A Adverse effect of other nonsteroidal anti-inflammatory drugs [NSAID], initial encounter: Secondary | ICD-10-CM | POA: Diagnosis present

## 2015-11-23 DIAGNOSIS — E785 Hyperlipidemia, unspecified: Secondary | ICD-10-CM | POA: Diagnosis present

## 2015-11-23 DIAGNOSIS — F4310 Post-traumatic stress disorder, unspecified: Secondary | ICD-10-CM | POA: Diagnosis present

## 2015-11-23 DIAGNOSIS — D631 Anemia in chronic kidney disease: Secondary | ICD-10-CM

## 2015-11-23 DIAGNOSIS — Z8249 Family history of ischemic heart disease and other diseases of the circulatory system: Secondary | ICD-10-CM

## 2015-11-23 DIAGNOSIS — Z992 Dependence on renal dialysis: Secondary | ICD-10-CM

## 2015-11-23 DIAGNOSIS — R197 Diarrhea, unspecified: Secondary | ICD-10-CM

## 2015-11-23 DIAGNOSIS — K210 Gastro-esophageal reflux disease with esophagitis: Secondary | ICD-10-CM | POA: Diagnosis present

## 2015-11-23 DIAGNOSIS — R112 Nausea with vomiting, unspecified: Secondary | ICD-10-CM

## 2015-11-23 DIAGNOSIS — I120 Hypertensive chronic kidney disease with stage 5 chronic kidney disease or end stage renal disease: Secondary | ICD-10-CM | POA: Diagnosis present

## 2015-11-23 DIAGNOSIS — N189 Chronic kidney disease, unspecified: Secondary | ICD-10-CM

## 2015-11-23 DIAGNOSIS — R195 Other fecal abnormalities: Secondary | ICD-10-CM | POA: Diagnosis present

## 2015-11-23 DIAGNOSIS — Z9119 Patient's noncompliance with other medical treatment and regimen: Secondary | ICD-10-CM

## 2015-11-23 DIAGNOSIS — K219 Gastro-esophageal reflux disease without esophagitis: Secondary | ICD-10-CM | POA: Diagnosis present

## 2015-11-23 DIAGNOSIS — E8889 Other specified metabolic disorders: Secondary | ICD-10-CM | POA: Diagnosis present

## 2015-11-23 DIAGNOSIS — K228 Other specified diseases of esophagus: Secondary | ICD-10-CM | POA: Diagnosis present

## 2015-11-23 DIAGNOSIS — K254 Chronic or unspecified gastric ulcer with hemorrhage: Principal | ICD-10-CM | POA: Diagnosis present

## 2015-11-23 DIAGNOSIS — I248 Other forms of acute ischemic heart disease: Secondary | ICD-10-CM | POA: Diagnosis present

## 2015-11-23 DIAGNOSIS — K2900 Acute gastritis without bleeding: Secondary | ICD-10-CM | POA: Diagnosis present

## 2015-11-23 DIAGNOSIS — E875 Hyperkalemia: Secondary | ICD-10-CM | POA: Diagnosis present

## 2015-11-23 DIAGNOSIS — K2970 Gastritis, unspecified, without bleeding: Secondary | ICD-10-CM | POA: Diagnosis present

## 2015-11-23 DIAGNOSIS — E876 Hypokalemia: Secondary | ICD-10-CM | POA: Diagnosis present

## 2015-11-23 DIAGNOSIS — F329 Major depressive disorder, single episode, unspecified: Secondary | ICD-10-CM | POA: Diagnosis present

## 2015-11-23 DIAGNOSIS — F312 Bipolar disorder, current episode manic severe with psychotic features: Secondary | ICD-10-CM | POA: Diagnosis present

## 2015-11-23 DIAGNOSIS — I2510 Atherosclerotic heart disease of native coronary artery without angina pectoris: Secondary | ICD-10-CM | POA: Diagnosis present

## 2015-11-23 DIAGNOSIS — R569 Unspecified convulsions: Secondary | ICD-10-CM | POA: Diagnosis present

## 2015-11-23 DIAGNOSIS — Z79899 Other long term (current) drug therapy: Secondary | ICD-10-CM

## 2015-11-23 DIAGNOSIS — Z888 Allergy status to other drugs, medicaments and biological substances status: Secondary | ICD-10-CM

## 2015-11-23 DIAGNOSIS — I959 Hypotension, unspecified: Secondary | ICD-10-CM | POA: Diagnosis present

## 2015-11-23 DIAGNOSIS — R1310 Dysphagia, unspecified: Secondary | ICD-10-CM | POA: Diagnosis not present

## 2015-11-23 DIAGNOSIS — N186 End stage renal disease: Secondary | ICD-10-CM | POA: Diagnosis present

## 2015-11-23 HISTORY — DX: Chronic kidney disease, unspecified: N18.9

## 2015-11-23 HISTORY — DX: Anemia in chronic kidney disease: D63.1

## 2015-11-23 LAB — CBC WITH DIFFERENTIAL/PLATELET
Basophils Absolute: 0 10*3/uL (ref 0.0–0.1)
Basophils Relative: 0 %
Eosinophils Absolute: 0.1 10*3/uL (ref 0.0–0.7)
Eosinophils Relative: 2 %
HCT: 25.8 % — ABNORMAL LOW (ref 36.0–46.0)
Hemoglobin: 8.3 g/dL — ABNORMAL LOW (ref 12.0–15.0)
Lymphocytes Relative: 18 %
Lymphs Abs: 1.3 10*3/uL (ref 0.7–4.0)
MCH: 29.1 pg (ref 26.0–34.0)
MCHC: 32.2 g/dL (ref 30.0–36.0)
MCV: 90.5 fL (ref 78.0–100.0)
Monocytes Absolute: 0.6 10*3/uL (ref 0.1–1.0)
Monocytes Relative: 8 %
Neutro Abs: 5.1 10*3/uL (ref 1.7–7.7)
Neutrophils Relative %: 72 %
Platelets: 165 10*3/uL (ref 150–400)
RBC: 2.85 MIL/uL — ABNORMAL LOW (ref 3.87–5.11)
RDW: 16.5 % — ABNORMAL HIGH (ref 11.5–15.5)
WBC: 7 10*3/uL (ref 4.0–10.5)

## 2015-11-23 LAB — COMPREHENSIVE METABOLIC PANEL
ALT: 10 U/L — ABNORMAL LOW (ref 14–54)
AST: 16 U/L (ref 15–41)
Albumin: 3.1 g/dL — ABNORMAL LOW (ref 3.5–5.0)
Alkaline Phosphatase: 92 U/L (ref 38–126)
Anion gap: 20 — ABNORMAL HIGH (ref 5–15)
BUN: 105 mg/dL — ABNORMAL HIGH (ref 6–20)
CO2: 18 mmol/L — ABNORMAL LOW (ref 22–32)
Calcium: 7.4 mg/dL — ABNORMAL LOW (ref 8.9–10.3)
Chloride: 90 mmol/L — ABNORMAL LOW (ref 101–111)
Creatinine, Ser: 6.75 mg/dL — ABNORMAL HIGH (ref 0.44–1.00)
GFR calc Af Amer: 7 mL/min — ABNORMAL LOW (ref >60)
GFR calc non Af Amer: 6 mL/min — ABNORMAL LOW (ref >60)
Glucose, Bld: 91 mg/dL (ref 65–99)
Potassium: 5.2 mmol/L — ABNORMAL HIGH (ref 3.5–5.1)
Sodium: 128 mmol/L — ABNORMAL LOW (ref 135–145)
Total Bilirubin: 2.4 mg/dL — ABNORMAL HIGH (ref 0.3–1.2)
Total Protein: 6 g/dL — ABNORMAL LOW (ref 6.5–8.1)

## 2015-11-23 LAB — LIPASE, BLOOD: Lipase: 19 U/L (ref 11–51)

## 2015-11-23 LAB — TROPONIN I: Troponin I: 0.06 ng/mL (ref <0.03)

## 2015-11-23 LAB — POC OCCULT BLOOD, ED: Fecal Occult Bld: POSITIVE — AB

## 2015-11-23 MED ORDER — LORAZEPAM 1 MG PO TABS
1.0000 mg | ORAL_TABLET | Freq: Once | ORAL | Status: AC
  Administered 2015-11-24: 1 mg via ORAL
  Filled 2015-11-23: qty 1

## 2015-11-23 MED ORDER — SODIUM CHLORIDE 0.9 % IV BOLUS (SEPSIS)
1000.0000 mL | Freq: Once | INTRAVENOUS | Status: AC
  Administered 2015-11-23: 1000 mL via INTRAVENOUS

## 2015-11-23 MED ORDER — ONDANSETRON HCL 4 MG/2ML IJ SOLN
4.0000 mg | Freq: Once | INTRAMUSCULAR | Status: AC
  Administered 2015-11-23: 4 mg via INTRAVENOUS
  Filled 2015-11-23: qty 2

## 2015-11-23 NOTE — ED Notes (Signed)
Pt continually asking for food

## 2015-11-23 NOTE — ED Notes (Signed)
Pt upset that staff cannot give her food or water. Pt c/o NVD and abdominal pain. Attempted to restart a new IV, pt agitated that phlebotomy came into room for lab work and asked tech and RN to leave. Second RN to start IV.

## 2015-11-23 NOTE — ED Provider Notes (Signed)
CSN: [id number]     Arrival date & time 11/23/15  2103 History   First MD Initiated Contact with Patient 11/23/15 2113     Chief Complaint  Patient presents with  . Emesis  . Diarrhea     (Consider location/radiation/quality/duration/timing/severity/associated sxs/prior Treatment) HPI   Patient is a 55 year old female with past medical history of hypertension, CAD on dialysis Tues/Thur/Sat, COPD, diabetes and reflux who presents to the emergency department with complaint of vomiting and diarrhea, onset 3 days. Patient reports having constant nausea, vomiting and diarrhea for the past 3 days. She notes she has tried taking Zofran at home which her doctor had prescribed her but notes she has been unable to keep medication down. Patient reports having dark tarry stools. Endorses associated use abdominal pain. Patient also reports having constant, midsternal chest pain over the past 3 days, denies radiation, denies any aggravating or alleviating factors. Denies any known sick contacts. Denies any recent use of antibiotics or hospitalizations. Patient reports she missed her dialysis on Saturday, 3 days ago, due to her diarrhea.  Past Medical History  Diagnosis Date  . Mental disorder   . Depression   . Hypertension   . Overdose   . Tobacco use disorder 11/13/2012  . Complication of anesthesia     difficulty going to sleep  . Chronic kidney disease     06/11/13- not on dialysis  . Shortness of breath     lying down flat  . PTSD (post-traumatic stress disorder)   . Asthma   . COPD (chronic obstructive pulmonary disease) ([place])   . Heart murmur   . GERD (gastroesophageal reflux disease)   . Seizures ([place])     "passsed out"  . History of blood transfusion   . Diabetes mellitus without complication [facility])     denies   Past Surgical History  Procedure Laterality Date  . Right knee replacement      she says it was last year.  . Esophagogastroduodenoscopy Left 11/14/2012    Procedure:  ESOPHAGOGASTRODUODENOSCOPY (EGD);  Surgeon: [name], MD;  Location: [facility];  Service: Endoscopy;  Laterality: Left;  . Joint replacement Right     knee  . Parathyroidectomy    . Av fistula placement Right 06/12/2013    Procedure: ARTERIOVENOUS (AV) FISTULA CREATION; RIGHT  BASILIC VEIN TRANSPOSITION with Intraoperative ultrasound;  Surgeon: Mal [name], MD;  Location: [facility] OR;  Service: Vascular;  Laterality: Right;   Family History  Problem Relation Age of Onset  . Diabetes Mother   . Hyperlipidemia Mother   . Hypertension Mother   . Diabetes Father   . Hypertension Father   . Hyperlipidemia Father    Social History  Substance Use Topics  . Smoking status: Current Every Day Smoker -- 2.00 packs/day for 40 years    Types: Cigarettes, Cigars  . Smokeless tobacco: Never Used  . Alcohol Use: No     Comment: none in over a month per daughter   OB History    No data available     Review of Systems  Cardiovascular: Positive for chest pain.  Gastrointestinal: Positive for nausea, vomiting, abdominal pain, diarrhea and blood in stool.  All other systems reviewed and are negative.     Allergies  Codeine sulfate; Gabapentin; Haldol; Risperidone and related; Trazodone and nefazodone; Invega; Vistaril; and Hydrocodone-acetaminophen  Home Medications   Prior to Admission medications   Medication Sig Start Date End Date Taking? Authorizing Provider  albuterol (PROVENTIL HFA;VENTOLIN HFA) 108 (90  Base) MCG/ACT inhaler Inhale 2 puffs into the lungs every 6 (six) hours as needed for wheezing or shortness of breath. 06/08/15   Clovis Fredrickson, MD  b complex-vitamin c-folic acid (NEPHRO-VITE) 0.8 MG TABS tablet Take 1 tablet by mouth at bedtime. 07/23/15   Hildred Priest, MD  cinacalcet (SENSIPAR) 60 MG tablet Take 1 tablet (60 mg total) by mouth daily. Takes with largest meal once daily 07/23/15   Hildred Priest, MD  Darbepoetin Alfa (ARANESP) 40 MCG/0.4ML  SOSY injection Inject 0.4 mLs (40 mcg total) into the vein every Saturday with hemodialysis. 08/09/15   Barton Dubois, MD  divalproex (DEPAKOTE ER) 250 MG 24 hr tablet Take 500 mg by mouth 2 (two) times daily. 10/25/15   Historical Provider, MD  Docusate Sodium (DSS) 100 MG CAPS Take 100 mg by mouth 2 (two) times daily. 06/08/15   Clovis Fredrickson, MD  doxepin (SINEQUAN) 25 MG capsule Take 25 mg by mouth at bedtime. 09/20/15   Historical Provider, MD  LORazepam (ATIVAN) 0.5 MG tablet Take 0.5 mg by mouth daily. 08/24/15   Historical Provider, MD  metoprolol tartrate (LOPRESSOR) 25 MG tablet Take 0.5 tablets (12.5 mg total) by mouth 2 (two) times daily. 08/09/15   Barton Dubois, MD  mometasone-formoterol [facility]) 100-5 MCG/ACT AERO Inhale 2 puffs into the lungs 2 (two) times daily. 06/08/15   Clovis Fredrickson, MD  pantoprazole (PROTONIX) 40 MG tablet Take 1 tablet (40 mg total) by mouth daily. 07/23/15   Hildred Priest, MD  sevelamer carbonate (RENVELA) 800 MG tablet Take 2 tablets (1,600 mg total) by mouth 3 (three) times daily with meals. Takes 2 tabs with each meal and snacks as directed 07/23/15   Hildred Priest, MD  simvastatin (ZOCOR) 10 MG tablet Take 1 tablet (10 mg total) by mouth at bedtime. Reported on 05/30/2015 07/23/15   Hildred Priest, MD  ziprasidone (GEODON) 60 MG capsule Take 2 capsules (120 mg total) by mouth daily with supper. Patient taking differently: Take 60 mg by mouth 2 (two) times daily with a meal.  07/22/15   Hildred Priest, MD   BP 93/58 mmHg  Pulse 118  Temp(Src) 99.2 F (37.3 C) (Oral)  Resp 16  SpO2 95%  LMP 05/09/2008 Physical Exam  Constitutional: She is oriented to person, place, and time. She appears well-developed and well-nourished. No distress.  HENT:  Head: Normocephalic and atraumatic.  Mouth/Throat: Uvula is midline and oropharynx is clear and moist. Mucous membranes are dry. No oropharyngeal exudate.  Eyes:  Conjunctivae and EOM are normal. Right eye exhibits no discharge. Left eye exhibits no discharge. No scleral icterus.  Neck: Normal range of motion. Neck supple.  Cardiovascular: Normal rate, regular rhythm, normal heart sounds and intact distal pulses.   Pulmonary/Chest: Effort normal and breath sounds normal. No respiratory distress. She has no wheezes. She has no rales. She exhibits no tenderness.  Abdominal: Soft. Bowel sounds are normal. She exhibits no distension and no mass. There is tenderness. There is no rebound and no guarding.  Pt reports diffuse abdominal tenderness with light palpation. No CVA tenderness.  Musculoskeletal: Normal range of motion.  Nonpitting edema noted to BLE  Neurological: She is alert and oriented to person, place, and time.  Skin: Skin is warm and dry. She is not diaphoretic.  Nursing note and vitals reviewed.   ED Course  Procedures (including critical care time) Labs Review Labs Reviewed  CBC WITH DIFFERENTIAL/PLATELET - Abnormal; Notable for the following:    RBC 2.85 (*)  Hemoglobin 8.3 (*)    HCT 25.8 (*)    RDW 16.5 (*)    All other components within normal limits  COMPREHENSIVE METABOLIC PANEL - Abnormal; Notable for the following:    Sodium 128 (*)    Potassium 5.2 (*)    Chloride 90 (*)    CO2 18 (*)    BUN 105 (*)    Creatinine, Ser 6.75 (*)    Calcium 7.4 (*)    Total Protein 6.0 (*)    Albumin 3.1 (*)    ALT 10 (*)    Total Bilirubin 2.4 (*)    GFR calc non Af Amer 6 (*)    GFR calc Af Amer 7 (*)    Anion gap 20 (*)    All other components within normal limits  URINALYSIS, ROUTINE W REFLEX MICROSCOPIC (NOT AT [facility]) - Abnormal; Notable for the following:    Hgb urine dipstick SMALL (*)    Protein, ur 100 (*)    All other components within normal limits  TROPONIN I - Abnormal; Notable for the following:    Troponin I 0.06 (*)    All other components within normal limits  URINE MICROSCOPIC-ADD ON - Abnormal; Notable for the  following:    Squamous Epithelial / LPF 6-30 (*)    Bacteria, UA FEW (*)    All other components within normal limits  POC OCCULT BLOOD, ED - Abnormal; Notable for the following:    Fecal Occult Bld POSITIVE (*)    All other components within normal limits  GASTROINTESTINAL PANEL BY PCR, STOOL (REPLACES STOOL CULTURE)  LIPASE, BLOOD  TYPE AND SCREEN    Imaging Review No results found. I have personally reviewed and evaluated these images and lab results as part of my medical decision-making.   EKG Interpretation None     EKG: sinus tachycardia, probable old anteroseptal infarct.   MDM   Final diagnoses:  Non-intractable vomiting with nausea, vomiting of unspecified type  Diarrhea, unspecified type  Gastrointestinal hemorrhage, unspecified gastritis, unspecified gastrointestinal hemorrhage type   Patient presents with N/V/D. endorses associated abdominal pain and blood in stool. Pt is dialysis pt and reports missing dialysis 3 days ago (saturday) due to diarrhea. Denies fever. On initial evaluation, HR 113, remaining vitals stable. On exam, pt reports diffuse abdominal tenderness with light palpation, no peritoneal signs, nonpitting edema noted to BLE. Remaining exam unremarkable. Pt given IVF and zofran. Hemoccult positive. Hgb 8.3, pt's baseline appears to be 8.4-9.4. Potassium 5.4. Troponin 0.06, which I suspect is due to pt's CKD. GI stool panel sent. EKG showed sinus tachycardia. Pt tolerating PO in the ED. On reevaluation pt is resting comfortable in bed, HR 107. Plan to admit pt for GI bleed. Pt does not appear to require emergent dialysis at this time. Consult hospitalist. Dr. Tamala Julian agrees to admission. Orders placed for obs telemetry bed. Discussed results and plan for admission with patient.    Chesley Noon [place], [place] 11/24/15 0100  Forde Dandy, MD 11/24/15 [phone]

## 2015-11-23 NOTE — ED Notes (Signed)
Pt returned from xray. Per rad tech, pt uncooperative and unwilling to have xray taken

## 2015-11-23 NOTE — ED Notes (Signed)
Pt to ED by EMS c/o multiple symptoms. Reports NVD x 5 days with headache, abdominal pain, back, and leg pain. Pt is a [facility] dialysis patient. Last treatment was on Thursday. Pt did not go to dialysis on Saturday "because I had diarrhea."

## 2015-11-24 ENCOUNTER — Observation Stay (HOSPITAL_COMMUNITY): Payer: Medicare Other

## 2015-11-24 DIAGNOSIS — D631 Anemia in chronic kidney disease: Secondary | ICD-10-CM | POA: Diagnosis not present

## 2015-11-24 DIAGNOSIS — K922 Gastrointestinal hemorrhage, unspecified: Secondary | ICD-10-CM | POA: Diagnosis present

## 2015-11-24 DIAGNOSIS — I10 Essential (primary) hypertension: Secondary | ICD-10-CM | POA: Diagnosis not present

## 2015-11-24 DIAGNOSIS — F312 Bipolar disorder, current episode manic severe with psychotic features: Secondary | ICD-10-CM | POA: Diagnosis not present

## 2015-11-24 LAB — RENAL FUNCTION PANEL
Albumin: 2.7 g/dL — ABNORMAL LOW (ref 3.5–5.0)
Albumin: 2.6 g/dL — ABNORMAL LOW (ref 3.5–5.0)
Anion gap: 18 — ABNORMAL HIGH (ref 5–15)
Anion gap: 11 (ref 5–15)
BUN: 121 mg/dL — ABNORMAL HIGH (ref 6–20)
BUN: 30 mg/dL — ABNORMAL HIGH (ref 6–20)
CO2: 20 mmol/L — ABNORMAL LOW (ref 22–32)
CO2: 28 mmol/L (ref 22–32)
Calcium: 7.2 mg/dL — ABNORMAL LOW (ref 8.9–10.3)
Calcium: 9.2 mg/dL (ref 8.9–10.3)
Chloride: 90 mmol/L — ABNORMAL LOW (ref 101–111)
Chloride: 96 mmol/L — ABNORMAL LOW (ref 101–111)
Creatinine, Ser: 7.04 mg/dL — ABNORMAL HIGH (ref 0.44–1.00)
Creatinine, Ser: 2.69 mg/dL — ABNORMAL HIGH (ref 0.44–1.00)
GFR calc Af Amer: 7 mL/min — ABNORMAL LOW (ref >60)
GFR calc Af Amer: 22 mL/min — ABNORMAL LOW (ref >60)
GFR calc non Af Amer: 6 mL/min — ABNORMAL LOW (ref >60)
GFR calc non Af Amer: 19 mL/min — ABNORMAL LOW (ref >60)
Glucose, Bld: 87 mg/dL (ref 65–99)
Glucose, Bld: 72 mg/dL (ref 65–99)
Phosphorus: 6.4 mg/dL — ABNORMAL HIGH (ref 2.5–4.6)
Phosphorus: 4.4 mg/dL (ref 2.5–4.6)
Potassium: 5.4 mmol/L — ABNORMAL HIGH (ref 3.5–5.1)
Potassium: 3.2 mmol/L — ABNORMAL LOW (ref 3.5–5.1)
Sodium: 128 mmol/L — ABNORMAL LOW (ref 135–145)
Sodium: 135 mmol/L (ref 135–145)

## 2015-11-24 LAB — URINE MICROSCOPIC-ADD ON

## 2015-11-24 LAB — GASTROINTESTINAL PANEL BY PCR, STOOL (REPLACES STOOL CULTURE)

## 2015-11-24 LAB — URINALYSIS, ROUTINE W REFLEX MICROSCOPIC
Bilirubin Urine: NEGATIVE
Glucose, UA: NEGATIVE mg/dL
Ketones, ur: NEGATIVE mg/dL
Leukocytes, UA: NEGATIVE
Nitrite: NEGATIVE
Protein, ur: 100 mg/dL — AB
Specific Gravity, Urine: 1.009 (ref 1.005–1.030)
pH: 6.5 (ref 5.0–8.0)

## 2015-11-24 LAB — CBC
HCT: 24.3 % — ABNORMAL LOW (ref 36.0–46.0)
Hemoglobin: 8.3 g/dL — ABNORMAL LOW (ref 12.0–15.0)
MCH: 31 pg (ref 26.0–34.0)
MCHC: 34.2 g/dL (ref 30.0–36.0)
MCV: 90.7 fL (ref 78.0–100.0)
Platelets: 154 10*3/uL (ref 150–400)
RBC: 2.68 MIL/uL — ABNORMAL LOW (ref 3.87–5.11)
RDW: 16.8 % — ABNORMAL HIGH (ref 11.5–15.5)
WBC: 5.3 10*3/uL (ref 4.0–10.5)

## 2015-11-24 LAB — PREPARE RBC (CROSSMATCH)

## 2015-11-24 LAB — HEMOGLOBIN AND HEMATOCRIT, BLOOD
HCT: 24.4 % — ABNORMAL LOW (ref 36.0–46.0)
HCT: 23.2 % — ABNORMAL LOW (ref 36.0–46.0)
Hemoglobin: 7.9 g/dL — ABNORMAL LOW (ref 12.0–15.0)
Hemoglobin: 7.5 g/dL — ABNORMAL LOW (ref 12.0–15.0)

## 2015-11-24 LAB — TROPONIN I
Troponin I: 0.05 ng/mL (ref <0.03)
Troponin I: 0.04 ng/mL (ref <0.03)

## 2015-11-24 MED ORDER — MOMETASONE FURO-FORMOTEROL FUM 100-5 MCG/ACT IN AERO: 2.0000 | INHALATION_SPRAY | Freq: Two times a day (BID) | RESPIRATORY_TRACT | Status: DC

## 2015-11-24 MED ORDER — MOMETASONE FURO-FORMOTEROL FUM 100-5 MCG/ACT IN AERO
2.0000 | INHALATION_SPRAY | Freq: Two times a day (BID) | RESPIRATORY_TRACT | Status: DC
  Administered 2015-11-24 – 2015-12-01 (×12): 2 via RESPIRATORY_TRACT
  Filled 2015-11-24 (×4): qty 8.8

## 2015-11-24 MED ORDER — ACETAMINOPHEN 650 MG RE SUPP: 650.0000 mg | Freq: Four times a day (QID) | RECTAL | Status: DC | PRN

## 2015-11-24 MED ORDER — SODIUM CHLORIDE 0.9% FLUSH
3.0000 mL | Freq: Two times a day (BID) | INTRAVENOUS | Status: DC
  Administered 2015-11-24 – 2015-11-30 (×11): 3 mL via INTRAVENOUS

## 2015-11-24 MED ORDER — LORAZEPAM 0.5 MG PO TABS: 0.5000 mg | ORAL_TABLET | Freq: Every day | ORAL | Status: DC

## 2015-11-24 MED ORDER — SODIUM CHLORIDE 0.9 % IV SOLN: 100.0000 mL | INTRAVENOUS | Status: DC | PRN

## 2015-11-24 MED ORDER — PENTAFLUOROPROP-TETRAFLUOROETH EX AERO: 1.0000 "application " | INHALATION_SPRAY | CUTANEOUS | Status: DC | PRN

## 2015-11-24 MED ORDER — LIDOCAINE HCL (PF) 1 % IJ SOLN: 5.0000 mL | INTRAMUSCULAR | Status: DC | PRN

## 2015-11-24 MED ORDER — DOXEPIN HCL 25 MG PO CAPS: 25.0000 mg | ORAL_CAPSULE | Freq: Every day | ORAL | Status: DC

## 2015-11-24 MED ORDER — RENA-VITE PO TABS
1.0000 | ORAL_TABLET | Freq: Every day | ORAL | Status: DC
  Administered 2015-11-24 – 2015-11-30 (×7): 1 via ORAL
  Filled 2015-11-24 (×7): qty 1

## 2015-11-24 MED ORDER — ACETAMINOPHEN 325 MG PO TABS
650.0000 mg | ORAL_TABLET | Freq: Four times a day (QID) | ORAL | Status: DC | PRN
  Administered 2015-11-24 – 2015-12-01 (×15): 650 mg via ORAL
  Filled 2015-11-24 (×14): qty 2

## 2015-11-24 MED ORDER — SIMVASTATIN 20 MG PO TABS
10.0000 mg | ORAL_TABLET | Freq: Every day | ORAL | Status: DC
  Administered 2015-11-24 – 2015-11-30 (×7): 10 mg via ORAL
  Filled 2015-11-24 (×8): qty 1

## 2015-11-24 MED ORDER — SODIUM CHLORIDE 0.9 % IV SOLN
Freq: Once | INTRAVENOUS | Status: AC
  Administered 2015-11-30: 13:00:00 via INTRAVENOUS

## 2015-11-24 MED ORDER — PANTOPRAZOLE SODIUM 40 MG PO TBEC
40.0000 mg | DELAYED_RELEASE_TABLET | Freq: Two times a day (BID) | ORAL | Status: DC
Start: 2015-11-24 — End: 2015-11-24
  Administered 2015-11-24: 40 mg via ORAL
  Filled 2015-11-24: qty 1

## 2015-11-24 MED ORDER — ONDANSETRON HCL 4 MG PO TABS
4.0000 mg | ORAL_TABLET | Freq: Four times a day (QID) | ORAL | Status: DC | PRN
  Administered 2015-11-29: 4 mg via ORAL
  Filled 2015-11-24: qty 1

## 2015-11-24 MED ORDER — SEVELAMER CARBONATE 800 MG PO TABS
1600.0000 mg | ORAL_TABLET | Freq: Three times a day (TID) | ORAL | Status: DC
  Administered 2015-11-24 – 2015-12-01 (×9): 1600 mg via ORAL
  Filled 2015-11-24 (×14): qty 2

## 2015-11-24 MED ORDER — ZIPRASIDONE HCL 60 MG PO CAPS
60.0000 mg | ORAL_CAPSULE | Freq: Two times a day (BID) | ORAL | Status: DC
  Administered 2015-11-24 – 2015-12-01 (×12): 60 mg via ORAL
  Filled 2015-11-24 (×16): qty 1
  Filled 2015-11-24: qty 3
  Filled 2015-11-24: qty 1

## 2015-11-24 MED ORDER — ONDANSETRON HCL 4 MG/2ML IJ SOLN
4.0000 mg | Freq: Four times a day (QID) | INTRAMUSCULAR | Status: DC | PRN
  Administered 2015-11-25: 4 mg via INTRAVENOUS
  Filled 2015-11-24: qty 2

## 2015-11-24 MED ORDER — PANTOPRAZOLE SODIUM 40 MG IV SOLR
40.0000 mg | Freq: Two times a day (BID) | INTRAVENOUS | Status: DC
  Administered 2015-11-24 – 2015-11-30 (×12): 40 mg via INTRAVENOUS
  Filled 2015-11-24 (×14): qty 40

## 2015-11-24 MED ORDER — CINACALCET HCL 30 MG PO TABS
60.0000 mg | ORAL_TABLET | Freq: Every day | ORAL | Status: DC
  Administered 2015-11-24 – 2015-11-29 (×5): 60 mg via ORAL
  Filled 2015-11-24 (×7): qty 2

## 2015-11-24 MED ORDER — DIVALPROEX SODIUM ER 500 MG PO TB24
500.0000 mg | ORAL_TABLET | Freq: Two times a day (BID) | ORAL | Status: DC
  Administered 2015-11-24 – 2015-12-01 (×15): 500 mg via ORAL
  Filled 2015-11-24 (×16): qty 1

## 2015-11-24 MED ORDER — LIDOCAINE-PRILOCAINE 2.5-2.5 % EX CREA: 1.0000 "application " | TOPICAL_CREAM | CUTANEOUS | Status: DC | PRN

## 2015-11-24 MED ORDER — ALBUTEROL SULFATE (2.5 MG/3ML) 0.083% IN NEBU
2.5000 mg | INHALATION_SOLUTION | RESPIRATORY_TRACT | Status: DC | PRN
  Administered 2015-11-25 – 2015-11-30 (×10): 2.5 mg via RESPIRATORY_TRACT
  Filled 2015-11-24 (×11): qty 3

## 2015-11-24 MED ORDER — METOPROLOL TARTRATE 25 MG PO TABS: 12.5000 mg | ORAL_TABLET | Freq: Two times a day (BID) | ORAL | Status: DC

## 2015-11-24 NOTE — ED Notes (Signed)
Pt stood up in room - asking where the small puppy went. Attempted to re-orient pt. Pt now sitting on side of bed. Monitor intact.

## 2015-11-24 NOTE — Progress Notes (Signed)
[employer] [employer] is an ESRD pt on hemodialysis TTS at [facility]. She is currently being admitted as observation patient for GI bleed per hospitalist, GI has been consulted. Currently patient is sedated, unable to arouse to talk to me. Apparently has been agitated, wandering in ED. Will put in HD orders for today. No recent wt-bed wt 79.2 which is just under OP EDW. K+5.4, HGB 7.5.  Spoke with Dr. Broadus John, will give one unit PRBCs today on HD.   Please notify us if patient status changes to in-patient and we will consult formally.  Juanell Fairly, [facility] [phone]   I have seen and examined this patient and agree with plan as outlined by Juanell Fairly, NP-C. Broadus John A Anyae Griffith,MD 11/24/2015 1:19 PM

## 2015-11-24 NOTE — ED Notes (Signed)
Pt ate few bites of grits and drank sips of coffee. C/o nausea - emesis bag given.

## 2015-11-24 NOTE — ED Notes (Signed)
Pt removed IV from thumb.

## 2015-11-24 NOTE — ED Notes (Signed)
Pt removed herself from the vital sign monitoring equipment

## 2015-11-24 NOTE — ED Notes (Signed)
Dr Broadus John in w/pt.

## 2015-11-24 NOTE — ED Notes (Signed)
Pt tolerated sandwich and water. C/o being too hot in room, ice packs provided. Pt refusing to keep cardiac monitor on

## 2015-11-24 NOTE — Procedures (Signed)
I was present at this dialysis session. I have reviewed the session itself and made appropriate changes.   Filed Weights   11/24/15 1233  Weight: 77.3 kg (170 lb 6.7 oz)     Recent Labs Lab 11/24/15 0635  NA 128*  K 5.4*  CL 90*  CO2 20*  GLUCOSE 87  BUN 121*  CREATININE 7.04*  CALCIUM 7.2*  PHOS 6.4*     Recent Labs Lab 11/23/15 2300 11/24/15 0501 11/24/15 0635  WBC 7.0  --   --   NEUTROABS 5.1  --   --   HGB 8.3* 7.9* 7.5*  HCT 25.8* 24.4* 23.2*  MCV 90.5  --   --   PLT 165  --   --     Scheduled Meds: . cinacalcet  60 mg Oral Q lunch  . divalproex  500 mg Oral BID  . mometasone-formoterol  2 puff Inhalation BID  . multivitamin  1 tablet Oral QHS  . pantoprazole (PROTONIX) IV  40 mg Intravenous Q12H  . sevelamer carbonate  1,600 mg Oral TID WC  . simvastatin  10 mg Oral QHS  . sodium chloride flush  3 mL Intravenous Q12H  . ziprasidone  60 mg Oral BID WC   Continuous Infusions: . sodium chloride    . sodium chloride     PRN Meds:.sodium chloride, sodium chloride, acetaminophen **OR** acetaminophen, albuterol, lidocaine (PF), lidocaine-prilocaine, ondansetron **OR** ondansetron (ZOFRAN) IV, pentafluoroprop-tetrafluoroeth   Donetta Potts,  MD 11/24/2015, 1:20 PM

## 2015-11-24 NOTE — ED Notes (Addendum)
Monitor reapplied to pt when returned from bathroom. Pt noted w/no IV as per shift report d/t pt had removed x 2. Will attempt re-insertion when pt is assigned bed.

## 2015-11-24 NOTE — ED Notes (Signed)
Food and water given

## 2015-11-24 NOTE — H&P (Signed)
History and Physical    Jennifer Lawson [id number] DOB: 1961-04-15 DOA: 11/23/2015  Referring MD/NP/PA: Harlene Ramus PCP: [facility]  Patient coming from: home  Chief Complaint: Nausea and vomiting  HPI: Jennifer Lawson is a 55 y.o. female with medical history significant of ESRD on HD, HTN, bipolar disorder, dyslipidemia; who presents with complaints of nausea, vomiting, and diarrhea. Symptoms started acutely 3 days ago. Patient tried taking Zofran as previously prescribed but was unable to keep this medicine down. She reports complaints of dark tarry stools. Last time that she took [facility] powders was approximately 2 weeks ago. Reports missing hemodialysis 3 days ago due to her symptoms of diarrhea. Upon review of records it seems that the patient was recently admitted to [facility] from 7/4 through 7/13; after being found acutely altered after missing 2 hemodialysis sessions. Patient underwent psychiatric evaluation and at discharge they recommended her to stop taking aspirin BC headache powder, Fioricet, doxepin, Ativan, metoprolol, and Norflex. Lab work prior to leaving the facility included hemoglobin 9.9, creatinine 4.21, and BUN 35.   ED Course:  Upon admission into the emergency department patient was evaluated and seen to be afebrile, with heart rates up to 122, and all other vital signs within normal limits. Laboratory revealed WBC 7, hemoglobin 8.3, platelets 165, sodium 128, potassium 5.2, chloride 90, CO2 18, BUN 105, creatinine 6.75. Patient was given 1 L of IV fluids and TRH called to admit.  Review of Systems: As per HPI otherwise 10 point review of systems negative.   Past Medical History  Diagnosis Date  . Mental disorder   . Depression   . Hypertension   . Overdose   . Tobacco use disorder 11/13/2012  . Complication of anesthesia     difficulty going to sleep  . Chronic kidney disease     06/11/13- not on dialysis  . Shortness of breath      lying down flat  . PTSD (post-traumatic stress disorder)   . Asthma   . COPD (chronic obstructive pulmonary disease) ([place])   . Heart murmur   . GERD (gastroesophageal reflux disease)   . Seizures ([place])     "passsed out"  . History of blood transfusion   . Diabetes mellitus without complication [facility])     denies    Past Surgical History  Procedure Laterality Date  . Right knee replacement      she says it was last year.  . Esophagogastroduodenoscopy Left 11/14/2012    Procedure: ESOPHAGOGASTRODUODENOSCOPY (EGD);  Surgeon: [name], MD;  Location: [facility];  Service: Endoscopy;  Laterality: Left;  . Joint replacement Right     knee  . Parathyroidectomy    . Av fistula placement Right 06/12/2013    Procedure: ARTERIOVENOUS (AV) FISTULA CREATION; RIGHT  BASILIC VEIN TRANSPOSITION with Intraoperative ultrasound;  Surgeon: Mal [name], MD;  Location: [place];  Service: Vascular;  Laterality: Right;     reports that she has been smoking Cigarettes and Cigars.  She has a 80 pack-year smoking history. She has never used smokeless tobacco. She reports that she does not drink alcohol or use illicit drugs.  Allergies  Allergen Reactions  . Codeine Sulfate Anaphylaxis    Daughter called about having this allergy   . Gabapentin Other (See Comments) and Anaphylaxis    seizures  . Haldol [Haloperidol Lactate] Shortness Of Breath and Other (See Comments)    hallucinations  . Risperidone And Related Shortness Of Breath and  Other (See Comments)    hallucinations  . Trazodone And Nefazodone Other (See Comments)    Makes pt lose balance and fall  . Invega [Paliperidone Er] Nausea And Vomiting  . Vistaril [Hydroxyzine Hcl] Nausea And Vomiting  . Hydrocodone-Acetaminophen Itching    Not allergic to aceetaminophen    Family History  Problem Relation Age of Onset  . Diabetes Mother   . Hyperlipidemia Mother   . Hypertension Mother   . Diabetes Father   . Hypertension Father   .  Hyperlipidemia Father     Prior to Admission medications   Medication Sig Start Date End Date Taking? Authorizing Provider  albuterol (PROVENTIL HFA;VENTOLIN HFA) 108 (90 Base) MCG/ACT inhaler Inhale 2 puffs into the lungs every 6 (six) hours as needed for wheezing or shortness of breath. 06/08/15   Clovis Fredrickson, MD  b complex-vitamin c-folic acid (NEPHRO-VITE) 0.8 MG TABS tablet Take 1 tablet by mouth at bedtime. 07/23/15   Hildred Priest, MD  cinacalcet (SENSIPAR) 60 MG tablet Take 1 tablet (60 mg total) by mouth daily. Takes with largest meal once daily 07/23/15   Hildred Priest, MD  Darbepoetin Alfa (ARANESP) 40 MCG/0.4ML SOSY injection Inject 0.4 mLs (40 mcg total) into the vein every Saturday with hemodialysis. 08/09/15   Barton Dubois, MD  divalproex (DEPAKOTE ER) 250 MG 24 hr tablet Take 500 mg by mouth 2 (two) times daily. 10/25/15   Historical Provider, MD  Docusate Sodium (DSS) 100 MG CAPS Take 100 mg by mouth 2 (two) times daily. 06/08/15   Clovis Fredrickson, MD  doxepin (SINEQUAN) 25 MG capsule Take 25 mg by mouth at bedtime. 09/20/15   Historical Provider, MD  LORazepam (ATIVAN) 0.5 MG tablet Take 0.5 mg by mouth daily. 08/24/15   Historical Provider, MD  metoprolol tartrate (LOPRESSOR) 25 MG tablet Take 0.5 tablets (12.5 mg total) by mouth 2 (two) times daily. 08/09/15   Barton Dubois, MD  mometasone-formoterol [facility]) 100-5 MCG/ACT AERO Inhale 2 puffs into the lungs 2 (two) times daily. 06/08/15   Clovis Fredrickson, MD  pantoprazole (PROTONIX) 40 MG tablet Take 1 tablet (40 mg total) by mouth daily. 07/23/15   Hildred Priest, MD  sevelamer carbonate (RENVELA) 800 MG tablet Take 2 tablets (1,600 mg total) by mouth 3 (three) times daily with meals. Takes 2 tabs with each meal and snacks as directed 07/23/15   Hildred Priest, MD  simvastatin (ZOCOR) 10 MG tablet Take 1 tablet (10 mg total) by mouth at bedtime. Reported on 05/30/2015 07/23/15    Hildred Priest, MD  ziprasidone (GEODON) 60 MG capsule Take 2 capsules (120 mg total) by mouth daily with supper. Patient taking differently: Take 60 mg by mouth 2 (two) times daily with a meal.  07/22/15   Hildred Priest, MD    Physical Exam:  Constitutional: Elderly female NAD, calm, comfortable Filed Vitals:   11/23/15 2105 11/23/15 2326 11/23/15 2327 11/23/15 2345  BP: 141/88 93/58 93/58    Pulse: 113 122 122 118  Temp: 99.2 F (37.3 C)     TempSrc: Oral     Resp: 18  16   SpO2: 98% 98% 96% 95%   Eyes: PERRL, lids and conjunctivae normal ENMT: Mucous membranes are moist. Posterior pharynx clear of any exudate or lesions.Normal dentition.  Neck: normal, supple, no masses, no thyromegaly Respiratory: clear to auscultation bilaterally, no wheezing, no crackles. Normal respiratory effort. No accessory muscle use.  Cardiovascular: Regular rate and rhythm, no murmurs / rubs / gallops. No  extremity edema. 2+ pedal pulses. No carotid bruits. Right upper extremity fistula present. Abdomen: no tenderness, no masses palpated. No hepatosplenomegaly. Bowel sounds positive.  Musculoskeletal: no clubbing / cyanosis. No joint deformity upper and lower extremities. Good ROM, no contractures. Normal muscle tone.  Skin: no rashes, lesions, ulcers. No induration Neurologic: CN 2-12 grossly intact. Sensation intact, DTR normal. Strength 5/5 in all 4.  Psychiatric: Patient alert with slowed responses.    Labs on Admission: I have personally reviewed following labs and imaging studies  CBC:  Recent Labs Lab 11/23/15 2300  WBC 7.0  NEUTROABS 5.1  HGB 8.3*  HCT 25.8*  MCV 90.5  PLT 123XX123   Basic Metabolic Panel:  Recent Labs Lab 11/23/15 2300  NA 128*  K 5.2*  CL 90*  CO2 18*  GLUCOSE 91  BUN 105*  CREATININE 6.75*  CALCIUM 7.4*   GFR: CrCl cannot be calculated (Unknown ideal weight.). Liver Function Tests:  Recent Labs Lab 11/23/15 2300  AST 16  ALT  10*  ALKPHOS 92  BILITOT 2.4*  PROT 6.0*  ALBUMIN 3.1*    Recent Labs Lab 11/23/15 2300  LIPASE 19   No results for input(s): AMMONIA in the last 168 hours. Coagulation Profile: No results for input(s): INR, PROTIME in the last 168 hours. Cardiac Enzymes:  Recent Labs Lab 11/23/15 2300  TROPONINI 0.06*   BNP (last 3 results) No results for input(s): PROBNP in the last 8760 hours. HbA1C: No results for input(s): HGBA1C in the last 72 hours. CBG: No results for input(s): GLUCAP in the last 168 hours. Lipid Profile: No results for input(s): CHOL, HDL, LDLCALC, TRIG, CHOLHDL, LDLDIRECT in the last 72 hours. Thyroid Function Tests: No results for input(s): TSH, T4TOTAL, FREET4, T3FREE, THYROIDAB in the last 72 hours. Anemia Panel: No results for input(s): VITAMINB12, FOLATE, FERRITIN, TIBC, IRON, RETICCTPCT in the last 72 hours. Urine analysis:    Component Value Date/Time   COLORURINE YELLOW 11/23/2015 2359   APPEARANCEUR CLEAR 11/23/2015 2359   LABSPEC 1.009 11/23/2015 2359   PHURINE 6.5 11/23/2015 2359   GLUCOSEU NEGATIVE 11/23/2015 2359   HGBUR SMALL* 11/23/2015 2359   BILIRUBINUR NEGATIVE 11/23/2015 2359   KETONESUR NEGATIVE 11/23/2015 2359   PROTEINUR 100* 11/23/2015 2359   UROBILINOGEN 0.2 01/26/2015 1748   NITRITE NEGATIVE 11/23/2015 2359   LEUKOCYTESUR NEGATIVE 11/23/2015 2359   Sepsis Labs: No results found for this or any previous visit (from the past 240 hour(s)).   Radiological Exams on Admission: No results found.  EKG: Independently reviewed. Normal sinus rhythm  Assessment/Plan GI bleed/ question of acute blood loss anemia: Guiac positive stools hbg droped from 9.9 dow to 8.3 over last week. Question if secondary to history of nsaid use. - Admit to telemetry bed - T&S, and transfuse PRBCs if needed - Check H&H every 4 hours  - May warrant formal consultation to gastroenterology in a.m.  Nausea, vomiting, and diarrhea: Acute. Suspect nausea  vomiting symptoms could likely be secondary to missed hemodialysis and elevated BUN. - Follow-up GI panel - Held Colace - May warrant C. difficile panel if persist as patient has been recently hospitalized  - Zofran prn N/V   ESRD on HD: Patient normally dialyzes Tuesday/Thursday/Saturday, but reports missing Saturday dialysis. BUN elevated at 105 and creatinine 6.75. Patient had just been hospitalized at [facility] from 7/4-7/13 for missing hemodialysis as well. - Left message on hemodialysis answering machine line of the patient needing hemodialysis - Continue Renvela, Sensipar   Essential hypertension -  Discontinued metoprolol after review of patient's most recent discharge  Bipolar disorder - Continue Depakote and Geodon - Discontinued Ativan     DVT prophylaxis: heparin Code Status: Full Family Communication: No family present at bedside  Disposition Plan: undetermined  Consults called: none Admission status: telemetry observation  Norval Morton MD [facility] Pager [phone]  If 7PM-7AM, please contact night-coverage [email or web address] Password [email or web address]  11/24/2015, 2:55 AM

## 2015-11-24 NOTE — ED Notes (Signed)
Dialysis RN confirmed that it was OK that patient did not have IV access. She will consult IV team there.

## 2015-11-24 NOTE — ED Notes (Signed)
Dialysis NP in w/pt.

## 2015-11-24 NOTE — ED Notes (Signed)
Pt wearing hospital gown and her underwear. Pt also noted w/earring to left ear only.

## 2015-11-24 NOTE — ED Notes (Addendum)
Pt being transported to dialysis via bed. All belongings w/pt. Pt did not eat lunch.

## 2015-11-24 NOTE — Progress Notes (Signed)
Pt seen and examined, admitted earlier this am by Dr.Smith, please see H&P for details 55/F with Bipolar disorder, ESRD, long h/o non compliance with HD, and recent long Psychiatric admission to [facility] and then was at [facility] from 7/7 through 7/13 for AMS after missing HD sessions x 2 with associated psychiatric decompensation. Now here with N/V/Diarrhea and ? Melena Pt is drowsy and confused and unable to give any reasonable history at this time Hb is 7.5 from baseline of 8.5-9 Will transfuse 1 unit PRBC , start IV PPI and monitor closely for ongoing overt bleeding, none noted since admission EGD in 2014 with gastritis and small antral ulcer, hopefully will resolve with supportive care, PPI and may not endoscopic evaluation unless active bleeding ensues Renal consulted for HD today, missed last HD on Saturday  Domenic Polite, MD

## 2015-11-25 ENCOUNTER — Encounter (HOSPITAL_COMMUNITY): Payer: Self-pay | Admitting: General Practice

## 2015-11-25 DIAGNOSIS — I248 Other forms of acute ischemic heart disease: Secondary | ICD-10-CM | POA: Diagnosis present

## 2015-11-25 DIAGNOSIS — E8889 Other specified metabolic disorders: Secondary | ICD-10-CM | POA: Diagnosis present

## 2015-11-25 DIAGNOSIS — F17210 Nicotine dependence, cigarettes, uncomplicated: Secondary | ICD-10-CM | POA: Diagnosis present

## 2015-11-25 DIAGNOSIS — J449 Chronic obstructive pulmonary disease, unspecified: Secondary | ICD-10-CM | POA: Diagnosis present

## 2015-11-25 DIAGNOSIS — I10 Essential (primary) hypertension: Secondary | ICD-10-CM

## 2015-11-25 DIAGNOSIS — R000 Tachycardia, unspecified: Secondary | ICD-10-CM | POA: Diagnosis present

## 2015-11-25 DIAGNOSIS — K2210 Ulcer of esophagus without bleeding: Secondary | ICD-10-CM | POA: Diagnosis present

## 2015-11-25 DIAGNOSIS — D62 Acute posthemorrhagic anemia: Secondary | ICD-10-CM | POA: Diagnosis present

## 2015-11-25 DIAGNOSIS — I959 Hypotension, unspecified: Secondary | ICD-10-CM | POA: Diagnosis present

## 2015-11-25 DIAGNOSIS — F312 Bipolar disorder, current episode manic severe with psychotic features: Secondary | ICD-10-CM | POA: Diagnosis present

## 2015-11-25 DIAGNOSIS — A419 Sepsis, unspecified organism: Secondary | ICD-10-CM | POA: Diagnosis not present

## 2015-11-25 DIAGNOSIS — F319 Bipolar disorder, unspecified: Secondary | ICD-10-CM | POA: Diagnosis present

## 2015-11-25 DIAGNOSIS — K254 Chronic or unspecified gastric ulcer with hemorrhage: Secondary | ICD-10-CM | POA: Diagnosis present

## 2015-11-25 DIAGNOSIS — K2970 Gastritis, unspecified, without bleeding: Secondary | ICD-10-CM | POA: Diagnosis present

## 2015-11-25 DIAGNOSIS — K922 Gastrointestinal hemorrhage, unspecified: Secondary | ICD-10-CM | POA: Diagnosis present

## 2015-11-25 DIAGNOSIS — I120 Hypertensive chronic kidney disease with stage 5 chronic kidney disease or end stage renal disease: Secondary | ICD-10-CM | POA: Diagnosis present

## 2015-11-25 DIAGNOSIS — K228 Other specified diseases of esophagus: Secondary | ICD-10-CM | POA: Diagnosis present

## 2015-11-25 DIAGNOSIS — R0600 Dyspnea, unspecified: Secondary | ICD-10-CM | POA: Diagnosis not present

## 2015-11-25 DIAGNOSIS — E1122 Type 2 diabetes mellitus with diabetic chronic kidney disease: Secondary | ICD-10-CM | POA: Diagnosis present

## 2015-11-25 DIAGNOSIS — D631 Anemia in chronic kidney disease: Secondary | ICD-10-CM

## 2015-11-25 DIAGNOSIS — Z9119 Patient's noncompliance with other medical treatment and regimen: Secondary | ICD-10-CM | POA: Diagnosis not present

## 2015-11-25 DIAGNOSIS — N2581 Secondary hyperparathyroidism of renal origin: Secondary | ICD-10-CM | POA: Diagnosis present

## 2015-11-25 DIAGNOSIS — D122 Benign neoplasm of ascending colon: Secondary | ICD-10-CM | POA: Diagnosis present

## 2015-11-25 DIAGNOSIS — I2510 Atherosclerotic heart disease of native coronary artery without angina pectoris: Secondary | ICD-10-CM | POA: Diagnosis present

## 2015-11-25 DIAGNOSIS — Z992 Dependence on renal dialysis: Secondary | ICD-10-CM | POA: Diagnosis not present

## 2015-11-25 DIAGNOSIS — N186 End stage renal disease: Secondary | ICD-10-CM | POA: Diagnosis present

## 2015-11-25 DIAGNOSIS — K909 Intestinal malabsorption, unspecified: Secondary | ICD-10-CM | POA: Diagnosis present

## 2015-11-25 DIAGNOSIS — E871 Hypo-osmolality and hyponatremia: Secondary | ICD-10-CM | POA: Diagnosis present

## 2015-11-25 LAB — HEMOGLOBIN AND HEMATOCRIT, BLOOD
HCT: 23 % — ABNORMAL LOW (ref 36.0–46.0)
Hemoglobin: 7.4 g/dL — ABNORMAL LOW (ref 12.0–15.0)

## 2015-11-25 LAB — RETICULOCYTES
RBC.: 2.5 MIL/uL — ABNORMAL LOW (ref 3.87–5.11)
Retic Count, Absolute: 35 10*3/uL (ref 19.0–186.0)
Retic Ct Pct: 1.4 % (ref 0.4–3.1)

## 2015-11-25 LAB — PREPARE RBC (CROSSMATCH)

## 2015-11-25 LAB — FERRITIN: Ferritin: 345 ng/mL — ABNORMAL HIGH (ref 11–307)

## 2015-11-25 LAB — FOLATE: Folate: 25.9 ng/mL (ref >5.9)

## 2015-11-25 LAB — IRON AND TIBC
Iron: 81 ug/dL (ref 28–170)
Saturation Ratios: 41 % — ABNORMAL HIGH (ref 10.4–31.8)
TIBC: 197 ug/dL — ABNORMAL LOW (ref 250–450)
UIBC: 116 ug/dL

## 2015-11-25 LAB — VITAMIN B12: Vitamin B-12: 517 pg/mL (ref 180–914)

## 2015-11-25 LAB — TROPONIN I: Troponin I: 0.03 ng/mL (ref <0.03)

## 2015-11-25 LAB — MRSA PCR SCREENING: MRSA by PCR: NEGATIVE

## 2015-11-25 MED ORDER — SODIUM CHLORIDE 0.9 % IV SOLN: 100.0000 mL | INTRAVENOUS | Status: DC | PRN

## 2015-11-25 MED ORDER — LORAZEPAM 2 MG/ML IJ SOLN
0.5000 mg | Freq: Two times a day (BID) | INTRAMUSCULAR | Status: DC | PRN
  Administered 2015-11-25 – 2015-11-29 (×7): 0.5 mg via INTRAVENOUS
  Administered 2015-11-30: 09:00:00 via INTRAVENOUS
  Filled 2015-11-25 (×7): qty 1

## 2015-11-25 MED ORDER — DARBEPOETIN ALFA 200 MCG/0.4ML IJ SOSY
200.0000 ug | PREFILLED_SYRINGE | INTRAMUSCULAR | Status: DC
  Administered 2015-11-26: 200 ug via INTRAVENOUS
  Filled 2015-11-25: qty 0.4

## 2015-11-25 MED ORDER — SODIUM CHLORIDE 0.9 % IV SOLN
Freq: Once | INTRAVENOUS | Status: AC
  Administered 2015-11-25: 17:00:00 via INTRAVENOUS

## 2015-11-25 MED ORDER — ALTEPLASE 2 MG IJ SOLR: 2.0000 mg | Freq: Once | INTRAMUSCULAR | Status: DC | PRN

## 2015-11-25 MED ORDER — PRO-STAT SUGAR FREE PO LIQD
30.0000 mL | Freq: Two times a day (BID) | ORAL | Status: DC
  Administered 2015-11-25 – 2015-11-30 (×9): 30 mL via ORAL
  Filled 2015-11-25 (×11): qty 30

## 2015-11-25 MED ORDER — DOXERCALCIFEROL 4 MCG/2ML IV SOLN
4.0000 ug | INTRAVENOUS | Status: DC
  Administered 2015-11-28: 4 ug via INTRAVENOUS
  Filled 2015-11-25 (×3): qty 2

## 2015-11-25 NOTE — Progress Notes (Addendum)
Patient ID: Jennifer Lawson, female   DOB: 1960/08/30, 55 y.o.   MRN: [id number]     PROGRESS NOTE    [name]  [id number] DOB: Apr 09, 1961 DOA: 11/23/2015  PCP: [facility]   Brief Narrative:  55 y.o. female with medical history significant of ESRD on HD, HTN, bipolar disorder, dyslipidemia; who presents with complaints of nausea, vomiting, and diarrhea that started 3 days PTA. This was associated with dark stools. Pt reports taking Goody powders occasionally, used last 2 weeks ago. Pt also reported missing her HD 3 day PTA due to her symptoms.   Upon review of records, patient was recently admitted to [facility] from 7/4 through 7/13; after being found acutely altered after missing 2 hemodialysis sessions. Patient underwent psychiatric evaluation and at discharge they recommended her to stop taking aspirin BC powder.   Please note this pt has multiple medical conditions, acute GI bleed requiring multiple blood transfusions, GI consultation. Pt also with ESRD requiring HD. Ongoing N/V/D, unable to take PO at this time. Pt also meets criteria for Sepsis based on her VS but source is not clear and pt needs close monitoring and further work up. As a results she has demand ischemia evidenced by elevated troponins. She should meet criteria for inpatient hospitalization.   Assessment & Plan: GI bleed/ question of acute blood loss anemia - Guiac positive stools on admission - last EGD done by dr. Collene Mares in 2014 and was notable for moderate diffuse gastritis with some antral erosions secondary to NSAID use, no colonoscopy done based on the record review in EPIC  - pt has been transfused one unit PRBC but Hg still low and she has had two episodes of non bloody vomiting this AM - GI consulted for further assistance  - will go ahead and transfuse one U PRBC today  - continue protonix IV BID  - BMP in AM  SIRS, ? Sepsis - pt with T 96.8 F and one episode of T  100F, HR up to 116 bpm and initial BP 74/39, so meets sepsis criteria but no clear etiology - ? If related to N/V/D, viral component  - will check lactic acid and procalcitonin   Nausea, vomiting, and diarrhea - unclear etiology - vomiting could certainly be related to NSAID use but not sure about diarrhea - GI panel negative, PPI IV BID ordered  - consulted GI for assistance   ESRD on HD, Hypokalemia  - Patient normally dialyzes Tuesday/Thursday/Saturday,  - nephrology team following  - HypoK likely due to vomiting and diarrhea, K bath with HD - Continue Renvela, Sensipar   Troponins elevated - from demand ischemia rather than ACS - flat, no chest pain this AM - keep on tele, ECHO pending   Essential hypertension - was hypotensive earlier but now SBP in 100's  Bipolar disorder - Continue Depakote and Geodon - Discontinued Ativan   DVT prophylaxis: SCD's Code Status: Full Family Communication: Patient at bedside  Disposition Plan: to be determined   Consultants:   Nephrology   GI   Procedures:   None  Antimicrobials:   None   Subjective: Still confused but stable, has some nausea this am and two episodes of small vomiting, non bloody.   Objective: Filed Vitals:   11/24/15 2216 11/25/15 0618 11/25/15 0748 11/25/15 0848  BP: 121/73 144/67  139/84  Pulse: 110 101  103  Temp: 99.4 F (37.4 C) 96.8 F (36 C) 99.1 F (37.3 C) 99.2  F (37.3 C)  TempSrc: Oral Oral Oral Oral  Resp: 18 18  16   Weight:      SpO2: 100% 99%  97%    Intake/Output Summary (Last 24 hours) at 11/25/15 1200 Last data filed at 11/25/15 0849  Gross per 24 hour  Intake    505 ml  Output   1165 ml  Net   -660 ml   Filed Weights   11/24/15 1233 11/24/15 1653 11/24/15 1813  Weight: 77.3 kg (170 lb 6.7 oz) 76.4 kg (168 lb 6.9 oz) 76.2 kg (167 lb 15.9 oz)    Examination:  General exam: Appears comfortable but says she is nauseous  Respiratory system: Respiratory effort normal.  Diminished breath sounds at bases.  Cardiovascular system: S1 & S2 heard, tachycardia. no JVD, murmurs, rubs, gallops or clicks. No pedal edema. Gastrointestinal system: Abdomen is nondistended, soft but slightly tender in epigastric area. No organomegaly or masses felt.  Central nervous system: No focal neurological deficits. Extremities: Symmetric 5 x 5 power.  Data Reviewed: I have personally reviewed following labs and imaging studies  CBC:  Recent Labs Lab 11/23/15 2300 11/24/15 0501 11/24/15 0635 11/24/15 2003 11/25/15 0914  WBC 7.0  --   --  5.3  --   NEUTROABS 5.1  --   --   --   --   HGB 8.3* 7.9* 7.5* 8.3* 7.4*  HCT 25.8* 24.4* 23.2* 24.3* 23.0*  MCV 90.5  --   --  90.7  --   PLT 165  --   --  154  --    Basic Metabolic Panel:  Recent Labs Lab 11/23/15 2300 11/24/15 0635 11/24/15 2003  NA 128* 128* 135  K 5.2* 5.4* 3.2*  CL 90* 90* 96*  CO2 18* 20* 28  GLUCOSE 91 87 72  BUN 105* 121* 30*  CREATININE 6.75* 7.04* 2.69*  CALCIUM 7.4* 7.2* 9.2  PHOS  --  6.4* 4.4   Liver Function Tests:  Recent Labs Lab 11/23/15 2300 11/24/15 0635 11/24/15 2003  AST 16  --   --   ALT 10*  --   --   ALKPHOS 92  --   --   BILITOT 2.4*  --   --   PROT 6.0*  --   --   ALBUMIN 3.1* 2.7* 2.6*    Recent Labs Lab 11/23/15 2300  LIPASE 19   Cardiac Enzymes:  Recent Labs Lab 11/23/15 2300 11/24/15 0635 11/24/15 2003 11/25/15 0154  TROPONINI 0.06* 0.05* 0.04* 0.03*   Urine analysis:    Component Value Date/Time   COLORURINE YELLOW 11/23/2015 2359   APPEARANCEUR CLEAR 11/23/2015 2359   LABSPEC 1.009 11/23/2015 2359   PHURINE 6.5 11/23/2015 2359   GLUCOSEU NEGATIVE 11/23/2015 2359   HGBUR SMALL* 11/23/2015 2359   BILIRUBINUR NEGATIVE 11/23/2015 2359   KETONESUR NEGATIVE 11/23/2015 2359   PROTEINUR 100* 11/23/2015 2359   UROBILINOGEN 0.2 01/26/2015 1748   NITRITE NEGATIVE 11/23/2015 2359   LEUKOCYTESUR NEGATIVE 11/23/2015 2359   Recent Results (from the  past 240 hour(s))  Gastrointestinal Panel by PCR , Stool     Status: None   Collection Time: 11/23/15 10:07 PM  Result Value Ref Range Status   Campylobacter species NOT DETECTED NOT DETECTED Final   Plesimonas shigelloides NOT DETECTED NOT DETECTED Final   Salmonella species NOT DETECTED NOT DETECTED Final   Yersinia enterocolitica NOT DETECTED NOT DETECTED Final   Vibrio species NOT DETECTED NOT DETECTED Final   Vibrio cholerae NOT DETECTED  NOT DETECTED Final   Enteroaggregative E coli (EAEC) NOT DETECTED NOT DETECTED Final   Enteropathogenic E coli (EPEC) NOT DETECTED NOT DETECTED Final   Enterotoxigenic E coli (ETEC) NOT DETECTED NOT DETECTED Final   Shiga like toxin producing E coli (STEC) NOT DETECTED NOT DETECTED Final   E. coli O157 NOT DETECTED NOT DETECTED Final   Shigella/Enteroinvasive E coli (EIEC) NOT DETECTED NOT DETECTED Final   Cryptosporidium NOT DETECTED NOT DETECTED Final   Cyclospora cayetanensis NOT DETECTED NOT DETECTED Final   Entamoeba histolytica NOT DETECTED NOT DETECTED Final   Giardia lamblia NOT DETECTED NOT DETECTED Final   Adenovirus F40/41 NOT DETECTED NOT DETECTED Final   Astrovirus NOT DETECTED NOT DETECTED Final   Norovirus GI/GII NOT DETECTED NOT DETECTED Final   Rotavirus A NOT DETECTED NOT DETECTED Final   Sapovirus (I, II, IV, and V) NOT DETECTED NOT DETECTED Final  MRSA PCR Screening     Status: None   Collection Time: 11/24/15 11:02 PM  Result Value Ref Range Status   MRSA by PCR NEGATIVE NEGATIVE Final    Radiology Studies: Acute Abdominal Series 11/24/2015  Negative abdominal radiographs.  No acute cardiopulmonary disease.   Scheduled Meds: . sodium chloride   Intravenous Once  . cinacalcet  60 mg Oral Q lunch  . divalproex  500 mg Oral BID  . mometasone-formoterol  2 puff Inhalation BID  . multivitamin  1 tablet Oral QHS  . pantoprazole (PROTONIX) IV  40 mg Intravenous Q12H  . sevelamer carbonate  1,600 mg Oral TID WC  .  simvastatin  10 mg Oral QHS  . sodium chloride flush  3 mL Intravenous Q12H  . ziprasidone  60 mg Oral BID WC   Continuous Infusions:   Time spent: 20 minutes   Faye Ramsay, MD [facility] Pager [phone]  If 7PM-7AM, please contact night-coverage [email or web address] Password [email or web address] 11/25/2015, 12:00 PM

## 2015-11-25 NOTE — Consult Note (Signed)
Referring Provider:   Dr. Mart Piggs Primary Care Physician:  [place] [facility] Primary Gastroenterologist:  None (unassigned)  Reason for Consultation:  Gastrointestinal bleeding  HPI: Jennifer Lawson is a 55 y.o. female dialysis patient admitted through the emergency room yesterday because of nausea and vomiting, and noted to have heme positive stool with a significant drop in hemoglobin down to 7.4 this morning, essentially the same as it was yesterday prior to transfusion of the unit of packed cells, and down to grams from a month ago.   3 years ago, she had endoscopic evaluation by Dr. [name] which showed diffuse gastritis consistent with nonsteroidal injury. She had recently been using Goody powders but is not on maintenance aspirin or anticoagulation; she is not on PPI prophylaxis.   She has had never had colonoscopy.  Of note, the patient has bipolar disorder and a history of recent problems with altered mental status, associated with missing dialysis; she had psychiatric evaluation during a recent admission to [facility] about a week ago.   At the present time, the patient is awake and alert, but has flight of ideas, jumping from topic to topic, and has tangential thinking, and a very distant affect, asking inappropriate questions and unable to stay focused on topic whatsoever. In this context, obtaining a useful history is essentially impossible.  The patient had witnessed vomiting of undigested food this morning by her attending physician, but the emesis was nonbloody.  She has been having diarrhea recently, although again details are difficult to pin down.  The patient also points out difficulty swallowing, although I noticed that approximately half of her dinner tray was eaten.       Past Medical History  Diagnosis Date  . Mental disorder   . Depression   . Hypertension   . Overdose   . Tobacco use disorder 11/13/2012  . Complication of anesthesia      difficulty going to sleep  . Chronic kidney disease     06/11/13- not on dialysis  . Shortness of breath     lying down flat  . PTSD (post-traumatic stress disorder)   . Asthma   . COPD (chronic obstructive pulmonary disease) ([place])   . Heart murmur   . GERD (gastroesophageal reflux disease)   . Seizures ([place])     "passsed out"  . History of blood transfusion   . Diabetes mellitus without complication ([place])     denies  . Anemia of renal disease     Past Surgical History  Procedure Laterality Date  . Right knee replacement      she says it was last year.  . Esophagogastroduodenoscopy Left 11/14/2012    Procedure: ESOPHAGOGASTRODUODENOSCOPY (EGD);  Surgeon: [name], MD;  Location: [facility];  Service: Endoscopy;  Laterality: Left;  . Joint replacement Right     knee  . Parathyroidectomy    . Av fistula placement Right 06/12/2013    Procedure: ARTERIOVENOUS (AV) FISTULA CREATION; RIGHT  BASILIC VEIN TRANSPOSITION with Intraoperative ultrasound;  Surgeon: Mal [name], MD;  Location: [facility];  Service: Vascular;  Laterality: Right;    Prior to Admission medications   Medication Sig Start Date End Date Taking? Authorizing Provider  albuterol (PROVENTIL HFA;VENTOLIN HFA) 108 (90 Base) MCG/ACT inhaler Inhale 2 puffs into the lungs every 6 (six) hours as needed for wheezing or shortness of breath. 06/08/15  Yes Jolanta B Pucilowska, MD  b complex-vitamin c-folic acid (NEPHRO-VITE) 0.8 MG TABS tablet Take 1 tablet by  mouth at bedtime. 07/23/15  Yes Hildred Priest, MD  cinacalcet (SENSIPAR) 60 MG tablet Take 1 tablet (60 mg total) by mouth daily. Takes with largest meal once daily 07/23/15  Yes Hildred Priest, MD  Darbepoetin Alfa (ARANESP) 40 MCG/0.4ML SOSY injection Inject 0.4 mLs (40 mcg total) into the vein every Saturday with hemodialysis. 08/09/15  Yes Barton Dubois, MD  divalproex (DEPAKOTE ER) 250 MG 24 hr tablet Take 500 mg by mouth 2 (two) times daily. 10/25/15   Yes Historical Provider, MD  Docusate Sodium (DSS) 100 MG CAPS Take 100 mg by mouth 2 (two) times daily. 06/08/15  Yes Jolanta B Pucilowska, MD  doxepin (SINEQUAN) 25 MG capsule Take 25 mg by mouth at bedtime. 09/20/15  Yes Historical Provider, MD  LORazepam (ATIVAN) 0.5 MG tablet Take 0.5 mg by mouth daily. 08/24/15  Yes Historical Provider, MD  metoprolol tartrate (LOPRESSOR) 25 MG tablet Take 0.5 tablets (12.5 mg total) by mouth 2 (two) times daily. 08/09/15  Yes Barton Dubois, MD  mometasone-formoterol [facility]) 100-5 MCG/ACT AERO Inhale 2 puffs into the lungs 2 (two) times daily. 06/08/15  Yes Jolanta B Pucilowska, MD  pantoprazole (PROTONIX) 40 MG tablet Take 1 tablet (40 mg total) by mouth daily. 07/23/15  Yes Hildred Priest, MD  sevelamer carbonate (RENVELA) 800 MG tablet Take 2 tablets (1,600 mg total) by mouth 3 (three) times daily with meals. Takes 2 tabs with each meal and snacks as directed 07/23/15  Yes Hildred Priest, MD  simvastatin (ZOCOR) 10 MG tablet Take 1 tablet (10 mg total) by mouth at bedtime. Reported on 05/30/2015 07/23/15  Yes Hildred Priest, MD  ziprasidone (GEODON) 60 MG capsule Take 2 capsules (120 mg total) by mouth daily with supper. Patient taking differently: Take 60 mg by mouth 2 (two) times daily with a meal.  07/22/15  Yes Hildred Priest, MD    Current Facility-Administered Medications  Medication Dose Route Frequency Provider Last Rate Last Dose  . 0.9 %  sodium chloride infusion  100 mL Intravenous PRN Valentina Gu, NP      . 0.9 %  sodium chloride infusion  100 mL Intravenous PRN Valentina Gu, NP      . 0.9 %  sodium chloride infusion   Intravenous Once Donato Heinz, MD      . acetaminophen (TYLENOL) tablet 650 mg  650 mg Oral Q6H PRN Norval Morton, MD   650 mg at 11/25/15 1659   Or  . acetaminophen (TYLENOL) suppository 650 mg  650 mg Rectal Q6H PRN Norval Morton, MD      . albuterol (PROVENTIL) (2.5  MG/3ML) 0.083% nebulizer solution 2.5 mg  2.5 mg Nebulization Q2H PRN Norval Morton, MD      . cinacalcet (SENSIPAR) tablet 60 mg  60 mg Oral Q lunch Norval Morton, MD   60 mg at 11/25/15 1153  . [START ON 11/26/2015] Darbepoetin Alfa (ARANESP) injection 200 mcg  200 mcg Intravenous Q Thu-HD Valentina Gu, NP      . divalproex (DEPAKOTE ER) 24 hr tablet 500 mg  500 mg Oral BID Norval Morton, MD   500 mg at 11/25/15 0903  . [START ON 11/26/2015] doxercalciferol (HECTOROL) injection 4 mcg  4 mcg Intravenous Q T,Th,Sa-HD Valentina Gu, NP      . feeding supplement (PRO-STAT SUGAR FREE 64) liquid 30 mL  30 mL Oral BID Valentina Gu, NP   30 mL at 11/25/15 1543  . lidocaine (PF) (XYLOCAINE)  1 % injection 5 mL  5 mL Intradermal PRN Valentina Gu, NP      . lidocaine-prilocaine (EMLA) cream 1 application  1 application Topical PRN Valentina Gu, NP      . mometasone-formoterol (DULERA) 100-5 MCG/ACT inhaler 2 puff  2 puff Inhalation BID Domenic Polite, MD   2 puff at 11/24/15 2309  . multivitamin (RENA-VIT) tablet 1 tablet  1 tablet Oral QHS Norval Morton, MD   1 tablet at 11/24/15 2243  . ondansetron (ZOFRAN) tablet 4 mg  4 mg Oral Q6H PRN Norval Morton, MD       Or  . ondansetron (ZOFRAN) injection 4 mg  4 mg Intravenous Q6H PRN Norval Morton, MD   4 mg at 11/25/15 0800  . pantoprazole (PROTONIX) injection 40 mg  40 mg Intravenous Q12H Domenic Polite, MD   40 mg at 11/25/15 0904  . pentafluoroprop-tetrafluoroeth (GEBAUERS) aerosol 1 application  1 application Topical PRN Valentina Gu, NP      . sevelamer carbonate (RENVELA) tablet 1,600 mg  1,600 mg Oral TID WC Norval Morton, MD   1,600 mg at 11/25/15 0902  . simvastatin (ZOCOR) tablet 10 mg  10 mg Oral QHS Norval Morton, MD   10 mg at 11/24/15 2243  . sodium chloride flush (NS) 0.9 % injection 3 mL  3 mL Intravenous Q12H Norval Morton, MD   3 mL at 11/25/15 1129  . ziprasidone (GEODON) capsule 60 mg   60 mg Oral BID WC Norval Morton, MD   60 mg at 11/25/15 1659    Allergies as of 11/23/2015 - Review Complete 11/23/2015  Allergen Reaction Noted  . Codeine sulfate Anaphylaxis 08/29/2011  . Gabapentin Other (See Comments) and Anaphylaxis 02/04/2014  . Haldol [haloperidol lactate] Shortness Of Breath and Other (See Comments) 01/17/2013  . Risperidone and related Shortness Of Breath and Other (See Comments) 01/17/2013  . Trazodone and nefazodone Other (See Comments) 02/04/2014  . Invega [paliperidone er] Nausea And Vomiting 04/02/2014  . Vistaril [hydroxyzine hcl] Nausea And Vomiting 04/02/2014  . Hydrocodone-acetaminophen Itching 05/30/2015    Family History  Problem Relation Age of Onset  . Diabetes Mother   . Hyperlipidemia Mother   . Hypertension Mother   . Diabetes Father   . Hypertension Father   . Hyperlipidemia Father     Social History   Social History  . Marital Status: Divorced    Spouse Name: N/A  . Number of Children: N/A  . Years of Education: N/A   Occupational History  . Not on file.   Social History Main Topics  . Smoking status: Current Every Day Smoker -- 2.00 packs/day for 40 years    Types: Cigarettes, Cigars  . Smokeless tobacco: Never Used  . Alcohol Use: No     Comment: none in over a month per daughter  . Drug Use: No     Comment: pt denied any drug use  . Sexual Activity: No   Other Topics Concern  . Not on file   Social History Narrative    Review of Systems: Effectively unobtainable   Physical Exam: Vital signs in last 24 hours: Temp:  [96.8 F (36 C)-99.4 F (37.4 C)] 99.2 F (37.3 C) (07/19 1727) Pulse Rate:  [94-110] 94 (07/19 1727) Resp:  [16-18] 16 (07/19 1727) BP: (121-144)/(67-84) 137/70 mmHg (07/19 1727) SpO2:  [97 %-100 %] 100 % (07/19 1727) Last BM Date:  (Patient unsure)   this is  a well-nourished, healthy-appearing African-American female with the abnormal affect and mentation as described above, but without any  overt focal neurologic deficits. The skin is warm, but the conjunctivae are pale. Oropharynx benign. Chest clear. Heart has a 3/6 systolic murmur, regular rhythm. Abdomen minimally adipose, no discernible organomegaly, no guarding, mass effect, or tenderness (although she indicated that after palpating her stomach, it was starting to hurt). No peripheral edema   Intake/Output from previous day: 07/18 0701 - 07/19 0700 In: 385 [I.V.:50; Blood:335] Out: 1166 [Stool:1] Intake/Output this shift: Total I/O In: 1200 [P.O.:1200] Out: 0   Lab Results:  Recent Labs  11/23/15 2300  11/24/15 0635 11/24/15 2003 11/25/15 0914  WBC 7.0  --   --  5.3  --   HGB 8.3*  < > 7.5* 8.3* 7.4*  HCT 25.8*  < > 23.2* 24.3* 23.0*  PLT 165  --   --  154  --   < > = values in this interval not displayed. BMET  Recent Labs  11/23/15 2300 11/24/15 0635 11/24/15 2003  NA 128* 128* 135  K 5.2* 5.4* 3.2*  CL 90* 90* 96*  CO2 18* 20* 28  GLUCOSE 91 87 72  BUN 105* 121* 30*  CREATININE 6.75* 7.04* 2.69*  CALCIUM 7.4* 7.2* 9.2   LFT  Recent Labs  11/23/15 2300  11/24/15 2003  PROT 6.0*  --   --   ALBUMIN 3.1*  < > 2.6*  AST 16  --   --   ALT 10*  --   --   ALKPHOS 92  --   --   BILITOT 2.4*  --   --   < > = values in this interval not displayed. PT/INR No results for input(s): LABPROT, INR in the last 72 hours.  Studies/Results: Acute Abdominal Series  11/24/2015  CLINICAL DATA:  Nausea and vomiting.  Diarrhea. EXAM: DG ABDOMEN ACUTE W/ 1V CHEST COMPARISON:  August 06, 2015 FINDINGS: Mild atelectasis in the left lung base. Stable cardiomegaly. The hila and mediastinum are unchanged. No pneumothorax. No pulmonary nodules, masses, or suspicious infiltrates. No free air, portal venous gas, or pneumatosis. The bowel gas pattern is nonobstructive. No other acute abnormalities. IMPRESSION: Negative abdominal radiographs.  No acute cardiopulmonary disease. Electronically Signed   By: Dorise Bullion III  M.D   On: 11/24/2015 03:59    Impression: 1. Heme positive stool, apparently without overt GI bleeding 2. Progressive anemia, evidence for loss of about 2-3 g of hemoglobin over the past month (hemoglobin on June 27 was 9.4, currently 7.4 after 1 unit of packed cells) 3. Psychiatric problems impairing optimal history taking 4. History of gastritis on endoscopy 3 years ago  Plan:  I reviewed the nature, purpose, and risks of endoscopic and colonoscopic evaluation with this patient in some detail. She would not let me finished the description without jumping from 1 idea to the next, and at the end, she said "now you've got me scared." She says she wants to find out if she has cancer, but is afraid to have tests to do so. She doesn't want to do the cleanout. In other words, it was not really possible to engage with this patient in any usable fashion. She seems to have a fairly clear understanding of what I'm saying, but she does not seem to be able to process the information in a rational fashion.  Therefore, I called and spoke with the patient's daughter, Jennifer Lawson, who was very helpful. I explained basically  all of the above with her, including the nature, purpose, and risks of the procedures, and she seems to have a very good, rational understanding, and said she would talk with her mom to try to clarify with her what the issues are. I will check on the patient tomorrow and, tentatively, we'll keep her on our schedule for Friday to have endoscopy and colonoscopy, subject to the patient's approval.   LOS: 0 days   Kenza Munar V  11/25/2015, 6:17 PM   Pager [phone] If no answer or after 5 PM call [phone]

## 2015-11-25 NOTE — Progress Notes (Signed)
Patient is confused, unable to ask admission questions as result.

## 2015-11-25 NOTE — Progress Notes (Signed)
Telephone consent obtained for Blood Transfusion from Daughter Seylah Cirrincione. Witnessed by second Nurse Cybil RN.

## 2015-11-25 NOTE — Consult Note (Signed)
[facility] Renal Consultation Note    Indication for Consultation:  Management of ESRD/hemodialysis; anemia, hypertension/volume and secondary hyperparathyroidism PCP: [facility]  HPI: Jennifer Lawson is a 55 y.o. female with ESRD on hemodialysis TTS at [facility]. PMH significant for hypertension, DMT2, bipolar disorder, salicylate OD, seizures, COPD, SHPT, anemia of chronic disease, medical noncompliance.  Patient was admitted as observation patient for nausea, vomiting, diarrhea, endorsed dark, tarry stools. Has not been to dialysis center since 10/24/15. Had extended admission to [facility] at [facility], the was admitted to [facility] for AMS, noncompliance with dialysis.  Now patient is being admitted as in patient for possible sepsis, GI bleed. She had hemodialysis here 11/24/2015. She rec'd 1 unit of PRBCs with HD.   Currently patient is sitting up in room-has been wandering up and down halls. She cannot tell me why she is here other than "My stomach is upset". Has eaten entire lunch, denies nausea, vomiting today. No diarrhea. Denies chest pain/SOB. Unable to keep patient focused on history as she keeps asking if her being in the hospital so much will keep her from getting a kidney and if I will take her for a walk. Will get rest of history from EMR.   Currently HGB 7.4. GI has been consulted. Primary working her up for sepsis although no clear etiology. Mets SIRS criteria with low grade temp, elevated HR, hypotension.  Will manage hemodialysis.   Past Medical History  Diagnosis Date  . Mental disorder   . Depression   . Hypertension   . Overdose   . Tobacco use disorder 11/13/2012  . Complication of anesthesia     difficulty going to sleep  . Chronic kidney disease     06/11/13- not on dialysis  . Shortness of breath     lying down flat  . PTSD (post-traumatic stress disorder)   . Asthma   . COPD (chronic obstructive pulmonary  disease) ([place])   . Heart murmur   . GERD (gastroesophageal reflux disease)   . Seizures ([place])     "passsed out"  . History of blood transfusion   . Diabetes mellitus without complication ([place])     denies  . Anemia of renal disease    Past Surgical History  Procedure Laterality Date  . Right knee replacement      she says it was last year.  . Esophagogastroduodenoscopy Left 11/14/2012    Procedure: ESOPHAGOGASTRODUODENOSCOPY (EGD);  Surgeon: [name], MD;  Location: [facility];  Service: Endoscopy;  Laterality: Left;  . Joint replacement Right     knee  . Parathyroidectomy    . Av fistula placement Right 06/12/2013    Procedure: ARTERIOVENOUS (AV) FISTULA CREATION; RIGHT  BASILIC VEIN TRANSPOSITION with Intraoperative ultrasound;  Surgeon: Mal [name], MD;  Location: [facility] OR;  Service: Vascular;  Laterality: Right;   Family History  Problem Relation Age of Onset  . Diabetes Mother   . Hyperlipidemia Mother   . Hypertension Mother   . Diabetes Father   . Hypertension Father   . Hyperlipidemia Father    Social History:  reports that she has been smoking Cigarettes and Cigars.  She has a 80 pack-year smoking history. She has never used smokeless tobacco. She reports that she does not drink alcohol or use illicit drugs. Allergies  Allergen Reactions  . Codeine Sulfate Anaphylaxis    Daughter called about having this allergy   . Gabapentin Other (See Comments) and Anaphylaxis  seizures  . Haldol [Haloperidol Lactate] Shortness Of Breath and Other (See Comments)    hallucinations  . Risperidone And Related Shortness Of Breath and Other (See Comments)    hallucinations  . Trazodone And Nefazodone Other (See Comments)    Makes pt lose balance and fall  . Invega [Paliperidone Er] Nausea And Vomiting  . Vistaril [Hydroxyzine Hcl] Nausea And Vomiting  . Hydrocodone-Acetaminophen Itching    Not allergic to aceetaminophen   Prior to Admission medications   Medication Sig  Start Date End Date Taking? Authorizing Provider  albuterol (PROVENTIL HFA;VENTOLIN HFA) 108 (90 Base) MCG/ACT inhaler Inhale 2 puffs into the lungs every 6 (six) hours as needed for wheezing or shortness of breath. 06/08/15  Yes Jolanta B Pucilowska, MD  b complex-vitamin c-folic acid (NEPHRO-VITE) 0.8 MG TABS tablet Take 1 tablet by mouth at bedtime. 07/23/15  Yes Hildred Priest, MD  cinacalcet (SENSIPAR) 60 MG tablet Take 1 tablet (60 mg total) by mouth daily. Takes with largest meal once daily 07/23/15  Yes Hildred Priest, MD  Darbepoetin Alfa (ARANESP) 40 MCG/0.4ML SOSY injection Inject 0.4 mLs (40 mcg total) into the vein every Saturday with hemodialysis. 08/09/15  Yes Barton Dubois, MD  divalproex (DEPAKOTE ER) 250 MG 24 hr tablet Take 500 mg by mouth 2 (two) times daily. 10/25/15  Yes Historical Provider, MD  Docusate Sodium (DSS) 100 MG CAPS Take 100 mg by mouth 2 (two) times daily. 06/08/15  Yes Jolanta B Pucilowska, MD  doxepin (SINEQUAN) 25 MG capsule Take 25 mg by mouth at bedtime. 09/20/15  Yes Historical Provider, MD  LORazepam (ATIVAN) 0.5 MG tablet Take 0.5 mg by mouth daily. 08/24/15  Yes Historical Provider, MD  metoprolol tartrate (LOPRESSOR) 25 MG tablet Take 0.5 tablets (12.5 mg total) by mouth 2 (two) times daily. 08/09/15  Yes Barton Dubois, MD  mometasone-formoterol [facility]) 100-5 MCG/ACT AERO Inhale 2 puffs into the lungs 2 (two) times daily. 06/08/15  Yes Jolanta B Pucilowska, MD  pantoprazole (PROTONIX) 40 MG tablet Take 1 tablet (40 mg total) by mouth daily. 07/23/15  Yes Hildred Priest, MD  sevelamer carbonate (RENVELA) 800 MG tablet Take 2 tablets (1,600 mg total) by mouth 3 (three) times daily with meals. Takes 2 tabs with each meal and snacks as directed 07/23/15  Yes Hildred Priest, MD  simvastatin (ZOCOR) 10 MG tablet Take 1 tablet (10 mg total) by mouth at bedtime. Reported on 05/30/2015 07/23/15  Yes Hildred Priest, MD   ziprasidone (GEODON) 60 MG capsule Take 2 capsules (120 mg total) by mouth daily with supper. Patient taking differently: Take 60 mg by mouth 2 (two) times daily with a meal.  07/22/15  Yes Hildred Priest, MD   Current Facility-Administered Medications  Medication Dose Route Frequency Provider Last Rate Last Dose  . 0.9 %  sodium chloride infusion  100 mL Intravenous PRN Valentina Gu, NP      . 0.9 %  sodium chloride infusion  100 mL Intravenous PRN Valentina Gu, NP      . 0.9 %  sodium chloride infusion   Intravenous Once Donato Heinz, MD      . 0.9 %  sodium chloride infusion   Intravenous Once Theodis Blaze, MD      . acetaminophen (TYLENOL) tablet 650 mg  650 mg Oral Q6H PRN Norval Morton, MD   650 mg at 11/25/15 0904   Or  . acetaminophen (TYLENOL) suppository 650 mg  650 mg Rectal Q6H PRN Rondell A  Tamala Julian, MD      . albuterol (PROVENTIL) (2.5 MG/3ML) 0.083% nebulizer solution 2.5 mg  2.5 mg Nebulization Q2H PRN Norval Morton, MD      . cinacalcet (SENSIPAR) tablet 60 mg  60 mg Oral Q lunch Norval Morton, MD   60 mg at 11/25/15 1153  . divalproex (DEPAKOTE ER) 24 hr tablet 500 mg  500 mg Oral BID Norval Morton, MD   500 mg at 11/25/15 0903  . lidocaine (PF) (XYLOCAINE) 1 % injection 5 mL  5 mL Intradermal PRN Valentina Gu, NP      . lidocaine-prilocaine (EMLA) cream 1 application  1 application Topical PRN Valentina Gu, NP      . mometasone-formoterol (DULERA) 100-5 MCG/ACT inhaler 2 puff  2 puff Inhalation BID Domenic Polite, MD   2 puff at 11/24/15 2309  . multivitamin (RENA-VIT) tablet 1 tablet  1 tablet Oral QHS Norval Morton, MD   1 tablet at 11/24/15 2243  . ondansetron (ZOFRAN) tablet 4 mg  4 mg Oral Q6H PRN Norval Morton, MD       Or  . ondansetron (ZOFRAN) injection 4 mg  4 mg Intravenous Q6H PRN Norval Morton, MD   4 mg at 11/25/15 0800  . pantoprazole (PROTONIX) injection 40 mg  40 mg Intravenous Q12H Domenic Polite, MD    40 mg at 11/25/15 0904  . pentafluoroprop-tetrafluoroeth (GEBAUERS) aerosol 1 application  1 application Topical PRN Valentina Gu, NP      . sevelamer carbonate (RENVELA) tablet 1,600 mg  1,600 mg Oral TID WC Norval Morton, MD   1,600 mg at 11/25/15 0902  . simvastatin (ZOCOR) tablet 10 mg  10 mg Oral QHS Norval Morton, MD   10 mg at 11/24/15 2243  . sodium chloride flush (NS) 0.9 % injection 3 mL  3 mL Intravenous Q12H Norval Morton, MD   3 mL at 11/25/15 1129  . ziprasidone (GEODON) capsule 60 mg  60 mg Oral BID WC Norval Morton, MD   60 mg at 11/25/15 1111   Labs: Basic Metabolic Panel:  Recent Labs Lab 11/23/15 2300 11/24/15 0635 11/24/15 2003  NA 128* 128* 135  K 5.2* 5.4* 3.2*  CL 90* 90* 96*  CO2 18* 20* 28  GLUCOSE 91 87 72  BUN 105* 121* 30*  CREATININE 6.75* 7.04* 2.69*  CALCIUM 7.4* 7.2* 9.2  PHOS  --  6.4* 4.4   Liver Function Tests:  Recent Labs Lab 11/23/15 2300 11/24/15 0635 11/24/15 2003  AST 16  --   --   ALT 10*  --   --   ALKPHOS 92  --   --   BILITOT 2.4*  --   --   PROT 6.0*  --   --   ALBUMIN 3.1* 2.7* 2.6*    Recent Labs Lab 11/23/15 2300  LIPASE 19   No results for input(s): AMMONIA in the last 168 hours. CBC:  Recent Labs Lab 11/23/15 2300  11/24/15 0635 11/24/15 2003 11/25/15 0914  WBC 7.0  --   --  5.3  --   NEUTROABS 5.1  --   --   --   --   HGB 8.3*  < > 7.5* 8.3* 7.4*  HCT 25.8*  < > 23.2* 24.3* 23.0*  MCV 90.5  --   --  90.7  --   PLT 165  --   --  154  --   < > =  values in this interval not displayed. Cardiac Enzymes:  Recent Labs Lab 11/23/15 2300 11/24/15 0635 11/24/15 2003 11/25/15 0154  TROPONINI 0.06* 0.05* 0.04* 0.03*   CBG: No results for input(s): GLUCAP in the last 168 hours. Iron Studies:  Recent Labs  11/25/15 1210  IRON 81  TIBC 197*  FERRITIN 345*   Studies/Results: Acute Abdominal Series  11/24/2015  CLINICAL DATA:  Nausea and vomiting.  Diarrhea. EXAM: DG ABDOMEN ACUTE W/  1V CHEST COMPARISON:  August 06, 2015 FINDINGS: Mild atelectasis in the left lung base. Stable cardiomegaly. The hila and mediastinum are unchanged. No pneumothorax. No pulmonary nodules, masses, or suspicious infiltrates. No free air, portal venous gas, or pneumatosis. The bowel gas pattern is nonobstructive. No other acute abnormalities. IMPRESSION: Negative abdominal radiographs.  No acute cardiopulmonary disease. Electronically Signed   By: Dorise Bullion III M.D   On: 11/24/2015 03:59    ROS: As per HPI otherwise negative.   Physical Exam: Filed Vitals:   11/24/15 2216 11/25/15 0618 11/25/15 0748 11/25/15 0848  BP: 121/73 144/67  139/84  Pulse: 110 101  103  Temp: 99.4 F (37.4 C) 96.8 F (36 C) 99.1 F (37.3 C) 99.2 F (37.3 C)  TempSrc: Oral Oral Oral Oral  Resp: 18 18  16   Weight:      SpO2: 100% 99%  97%     General: Well developed, well nourished, in no acute distress. Head: Normocephalic, atraumatic, sclera non-icteric, mucus membranes are moist Neck: Supple. JVD not elevated. Lungs: Clear bilaterally to auscultation without wheezes, rales, or rhonchi. Breathing is unlabored. Heart: RRR with S1 S2. No murmurs, rubs, or gallops appreciated. Abdomen: Soft, non-tender, non-distended with normoactive bowel sounds. No rebound/guarding. No obvious abdominal masses. M-S:  Strength and tone appear normal for age. Lower extremities: Trace LE edema bilaterally.  Neuro: Alert and oriented X 3. Moves all extremities spontaneously. Psych:  Calm, has difficulty focusing on one topic. Wandering up and down halls.  Dialysis Access: RUA AVF + bruit  Dialysis Orders: [facility]-TTS 4 hours 2.0K/2.0 Ca 400/800  UF profile 2 Heparin: NONE Mircera 225 mcg IV q 2 weeks ( last dose 10/20/15-no recent in-center HGB) Hectoral 6 mcg IV q treatment (no recent BMD labs)  Assessment/Plan: 1.  Acute Blood loss anemia: Per primary  Presumed GI-GI consulted. HGB 7.4. 1 unit of  PRBCs ordered. rec'd 1 unit of PRBCs 11/24/15 2.  Possible Sepsis: Per primary. Lactic acid/procalcitonin levels ordered. T-max 99.2 WBC 5.3 3.  ESRD -  TTS at [facility]. Will have HD on schedule tomorrow. K+ now 3.2. Use 4.0 K bath. No heparin.  4.  Hypertension/volume  -SBP 120s-140s. Had HD 11/24/15 pre wt 77.3 became hypotensive while on HD, Net UF 1165 Post Wt 76.4 kg.  5.  Anemia  - As noted above. Give ESA tomorrow with HD. Check HGB prior to HD-may need transfusion.  6.  Metabolic bone disease -  Ca 9.2 C Ca 10.3 Phos 4.4. Continue binders, sensipar, lower VDRA dose. Check pth.  7.  Nutrition - Albumin 2.6. K+ 3.5-currently on cardiac diet-leave for now as K+ is low, phos OK. Add fld restrictions. Renal vit/prostat. 8.  Bipolar disorder: per primary 9.  Elevated Troponin: Descending flat trend. Possible demand ischemia although difficult to evaluate with ESRD. Per primary.   Rita H. Owens Shark, NP-C 11/25/2015, 2:14 PM  [employer] [phone]      I have seen and examined this patient and agree with plan  as outlined by Juanell Fairly, NP-C.  Ms. Burchill continues to have waxing and waning mental status which she was recently admitted to [place] as well as [facility].  Will continue with IHD on TTS schedule, however she has been noncompliant with both attendance and completion of her treatments.  If she continues with this behavior we may need to involve Palliative care again, as she has had progressive failure to thrive and multiple hospital admissions over the last several months. Broadus John A Aijah Lattner,MD 11/25/2015 3:02 PM

## 2015-11-26 ENCOUNTER — Inpatient Hospital Stay (HOSPITAL_COMMUNITY): Payer: Medicare Other

## 2015-11-26 DIAGNOSIS — N186 End stage renal disease: Secondary | ICD-10-CM

## 2015-11-26 DIAGNOSIS — R0600 Dyspnea, unspecified: Secondary | ICD-10-CM

## 2015-11-26 DIAGNOSIS — Z992 Dependence on renal dialysis: Secondary | ICD-10-CM

## 2015-11-26 LAB — ECHOCARDIOGRAM COMPLETE: Weight: 2772.5 oz

## 2015-11-26 LAB — RENAL FUNCTION PANEL
Albumin: 2.6 g/dL — ABNORMAL LOW (ref 3.5–5.0)
Anion gap: 9 (ref 5–15)
BUN: 55 mg/dL — ABNORMAL HIGH (ref 6–20)
CO2: 26 mmol/L (ref 22–32)
Calcium: 8.4 mg/dL — ABNORMAL LOW (ref 8.9–10.3)
Chloride: 94 mmol/L — ABNORMAL LOW (ref 101–111)
Creatinine, Ser: 4.62 mg/dL — ABNORMAL HIGH (ref 0.44–1.00)
GFR calc Af Amer: 11 mL/min — ABNORMAL LOW (ref >60)
GFR calc non Af Amer: 10 mL/min — ABNORMAL LOW (ref >60)
Glucose, Bld: 76 mg/dL (ref 65–99)
Phosphorus: 5.2 mg/dL — ABNORMAL HIGH (ref 2.5–4.6)
Potassium: 4.2 mmol/L (ref 3.5–5.1)
Sodium: 129 mmol/L — ABNORMAL LOW (ref 135–145)

## 2015-11-26 LAB — TYPE AND SCREEN
ABO/RH(D): A POS
Antibody Screen: NEGATIVE
Unit division: 0
Unit division: 0

## 2015-11-26 LAB — CBC
HCT: 24.2 % — ABNORMAL LOW (ref 36.0–46.0)
Hemoglobin: 7.7 g/dL — ABNORMAL LOW (ref 12.0–15.0)
MCH: 28.9 pg (ref 26.0–34.0)
MCHC: 31.8 g/dL (ref 30.0–36.0)
MCV: 91 fL (ref 78.0–100.0)
Platelets: 149 10*3/uL — ABNORMAL LOW (ref 150–400)
RBC: 2.66 MIL/uL — ABNORMAL LOW (ref 3.87–5.11)
RDW: 18.3 % — ABNORMAL HIGH (ref 11.5–15.5)
WBC: 7.3 10*3/uL (ref 4.0–10.5)

## 2015-11-26 LAB — GLUCOSE, CAPILLARY: Glucose-Capillary: 69 mg/dL (ref 65–99)

## 2015-11-26 MED ORDER — LORAZEPAM 2 MG/ML IJ SOLN
INTRAMUSCULAR | Status: AC
  Filled 2015-11-26: qty 1

## 2015-11-26 MED ORDER — ALBUTEROL SULFATE (2.5 MG/3ML) 0.083% IN NEBU
INHALATION_SOLUTION | RESPIRATORY_TRACT | Status: AC
Start: 2015-11-26 — End: 2015-11-26
  Filled 2015-11-26: qty 3

## 2015-11-26 MED ORDER — DOXERCALCIFEROL 4 MCG/2ML IV SOLN
INTRAVENOUS | Status: AC
  Filled 2015-11-26: qty 2

## 2015-11-26 MED ORDER — DARBEPOETIN ALFA 200 MCG/0.4ML IJ SOSY
PREFILLED_SYRINGE | INTRAMUSCULAR | Status: AC
  Filled 2015-11-26: qty 0.4

## 2015-11-26 MED ORDER — PEG 3350-KCL-NA BICARB-NACL 420 G PO SOLR
4000.0000 mL | Freq: Once | ORAL | Status: AC
  Administered 2015-11-26: 4000 mL via ORAL
  Filled 2015-11-26: qty 4000

## 2015-11-26 NOTE — Progress Notes (Signed)
11/26/2015 6:21 PM  Patient continues to continuously call this RN asking for food and crackers, despite multiple attempts at educating the patient as to why she can't eat.  She is becoming increasingly agitated, demanding her diet to be changed.  She is still working on her bowel prep--about 20% through at this point.  No vomiting or nausea noted.  Sitter remains at bedside. Will continue to monitor. Princella Pellegrini

## 2015-11-26 NOTE — Progress Notes (Signed)
11/26/2015 3:59 PM  Patient continues to call this RN as well as the Retail banker for solid food, despite numerous attempts at educating patient on her need for clear liquids.  Pt is still working on her bowel prep, has maybe gotten about three cups in so far.  Pt is upset that "we can pump her full of all these prep fluids but not give her anything solid."  This RN re-educated the patient on her diet orders and restrictions.  Safety sitter placed at bedside as patient has been attempting to get OOB impulsively on her own without calling for assistance and is a high fall risk.  Will monitor.  Orson Gear Brooke\

## 2015-11-26 NOTE — Progress Notes (Signed)
11/26/2015 12:45 PM  Consent obtained from patient for EGD and colonoscopy planned for 7/21.  Daughter, [name], was also called and consent was obtained for the procedure from her as well per Dr. Osborn Coho request. Consent placed in chart.  Princella Pellegrini

## 2015-11-26 NOTE — Care Management Important Message (Signed)
Important Message  Patient Details  Name: Jennifer Lawson MRN: [id number] Date of Birth: 08-19-1960   Medicare Important Message Given:  Yes    Dandre Sisler, Leroy Sea 11/26/2015, 1:37 PM

## 2015-11-26 NOTE — Progress Notes (Signed)
PT Cancellation Note  Patient Details Name: ADE DEITERING MRN: [id number] DOB: Aug 31, 1960   Cancelled Treatment:    Reason Eval/Treat Not Completed: Patient at procedure or test/unavailable.  Patient in HD this am.  Has initiated bowel prep for procedures for tomorrow.  Will return at later time for PT evaluation.   Despina Pole 11/26/2015, 2:27 PM Carita Pian. Sanjuana Kava, [place] Pager [phone]

## 2015-11-26 NOTE — Progress Notes (Signed)
Patient remains somewhat tangential in thinking, but indicates she is agreeable to endoscopy and colonoscopy tomorrow, and questions were answered.  Patient indicates she has vomiting, but is also saying "I can't go a day without eating" and is asking for solid food in addition to her clear liquid diet.  Prep orders written. Will request consent from both patient and daughter. Procedures are scheduled for 11:30 tomorrow morning.  Cleotis Nipper, M.D. Pager [phone] If no answer or after 5 PM call [phone]

## 2015-11-26 NOTE — Procedures (Signed)
I was present at this dialysis session. I have reviewed the session itself and made appropriate changes.   Filed Weights   11/24/15 1813 11/25/15 2117 11/26/15 0641  Weight: 76.2 kg (167 lb 15.9 oz) 81 kg (178 lb 9.2 oz) 79.6 kg (175 lb 7.8 oz)     Recent Labs Lab 11/26/15 0703  NA 129*  K 4.2  CL 94*  CO2 26  GLUCOSE 76  BUN 55*  CREATININE 4.62*  CALCIUM 8.4*  PHOS 5.2*     Recent Labs Lab 11/23/15 2300  11/24/15 2003 11/25/15 0914 11/26/15 0703  WBC 7.0  --  5.3  --  7.3  NEUTROABS 5.1  --   --   --   --   HGB 8.3*  < > 8.3* 7.4* 7.7*  HCT 25.8*  < > 24.3* 23.0* 24.2*  MCV 90.5  --  90.7  --  91.0  PLT 165  --  154  --  149*  < > = values in this interval not displayed.  Scheduled Meds: . sodium chloride   Intravenous Once  . cinacalcet  60 mg Oral Q lunch  . Darbepoetin Alfa      . darbepoetin (ARANESP) injection - DIALYSIS  200 mcg Intravenous Q Thu-HD  . divalproex  500 mg Oral BID  . doxercalciferol      . doxercalciferol  4 mcg Intravenous Q T,Th,Sa-HD  . feeding supplement (PRO-STAT SUGAR FREE 64)  30 mL Oral BID  . mometasone-formoterol  2 puff Inhalation BID  . multivitamin  1 tablet Oral QHS  . pantoprazole (PROTONIX) IV  40 mg Intravenous Q12H  . sevelamer carbonate  1,600 mg Oral TID WC  . simvastatin  10 mg Oral QHS  . sodium chloride flush  3 mL Intravenous Q12H  . ziprasidone  60 mg Oral BID WC   Continuous Infusions:  PRN Meds:.sodium chloride, sodium chloride, sodium chloride, acetaminophen **OR** acetaminophen, albuterol, alteplase, lidocaine (PF), lidocaine-prilocaine, LORazepam, ondansetron **OR** ondansetron (ZOFRAN) IV, pentafluoroprop-tetrafluoroeth   Donetta Potts,  MD 11/26/2015, 9:06 AM

## 2015-11-26 NOTE — Progress Notes (Signed)
11/26/2015 3:13 PM  Pt refused afternoon labs ordered by MD, despite education on why they needed to be drawn.  Pt is currently upset that she cannot have anything solid to eat, despite multiple explanations as to the rationale by nursing staff as well as MDs.  I offered for lab to come back later this afternoon once she felt a little better, but the patient continued to adamantly refuse labs.  Dr. Doyle Askew notified.  Will continue to monitor. Princella Pellegrini

## 2015-11-26 NOTE — Progress Notes (Signed)
Patient ID: Jennifer Lawson, female   DOB: 01-21-1961, 55 y.o.   MRN: [id number]     PROGRESS NOTE    Jennifer Lawson  [id number] DOB: 05/17/1960 DOA: 11/23/2015  [facility]P: [facility]   Brief Narrative:  55 y.o. female with medical history significant of ESRD on HD, HTN, bipolar disorder, dyslipidemia; who presents with complaints of nausea, vomiting, and diarrhea that started 3 days PTA. This was associated with dark stools. Pt reports taking Goody powders occasionally, used last 2 weeks ago. Pt also reported missing her HD 3 day PTA due to her symptoms.   Upon review of records, patient was recently admitted to [facility] from 7/4 through 7/13; after being found acutely altered after missing 2 hemodialysis sessions. Patient underwent psychiatric evaluation and at discharge they recommended her to stop taking aspirin BC powder.   Assessment & Plan: GI bleed/ question of acute blood loss anemia - Guiac positive stools on admission - last EGD done by dr. Collene Mares in 2014 and was notable for moderate diffuse gastritis with some antral erosions secondary to NSAID use, no colonoscopy done based on the record review in EPIC  - pt has been transfused one unit PRBC but Hg still low and she has had two episodes of non bloody vomiting this AM - GI consulted for further assistance, plan for endoscopy in AM, working on trying to get consent  One U PRBC transfused 7/19 and Hg up from 7.4 -->: 7.9 - continue protonix IV BID  - BMP in AM  SIRS, ? Sepsis - pt with T 96.8 F and one episode of T 100F, HR up to 116 bpm and initial BP 74/39, so meets sepsis criteria but no clear etiology, Tmax in the past 24 hours 99 F - ? If related to N/V/D, viral component  - will check lactic acid and procalcitonin today   Nausea, vomiting, and diarrhea - unclear etiology - vomiting could certainly be related to NSAID use but not sure about diarrhea - GI panel negative, PPI IV BID provided    - consulted GI for assistance, plan for endoscopy in AM  ESRD on HD, Hypokalemia  - Patient normally dialyzes Tuesday/Thursday/Saturday,  - nephrology team following  - HypoK likely due to vomiting and diarrhea, K bath with HD - Continue Renvela, Sensipar   Troponins elevated - from demand ischemia rather than ACS - flat, no chest pain this AM - keep on tele, ECHO still pending   Hyponatremia - monitor for now  Essential hypertension - was hypotensive earlier but now SBP in 100's  Bipolar disorder - Continue Depakote and Geodon - Discontinued Ativan   DVT prophylaxis: SCD's Code Status: Full Family Communication: Patient at bedside  Disposition Plan: to be determined   Consultants:   Nephrology   GI   Procedures:   None  Antimicrobials:   None   Subjective: Still confused but stable, has some nausea this am but wants to eat.   Objective: Filed Vitals:   11/26/15 1057 11/26/15 1100 11/26/15 1131 11/26/15 1246  BP: 109/60 111/69 128/75   Pulse: 97 98 99   Temp: 98.2 F (36.8 C)  99.1 F (37.3 C)   TempSrc:   Oral   Resp:   17   Weight: 78.6 kg (173 lb 4.5 oz)     SpO2:   91% 94%    Intake/Output Summary (Last 24 hours) at 11/26/15 1336 Last data filed at 11/26/15 1100  Gross per 24  hour  Intake    815 ml  Output   1020 ml  Net   -205 ml   Filed Weights   11/25/15 2117 11/26/15 0641 11/26/15 1057  Weight: 81 kg (178 lb 9.2 oz) 79.6 kg (175 lb 7.8 oz) 78.6 kg (173 lb 4.5 oz)    Examination:  General exam: Appears comfortable but says she is nauseous  Respiratory system: Respiratory effort normal. Diminished breath sounds at bases.  Cardiovascular system: S1 & S2 heard, tachycardia. no JVD, murmurs, rubs, gallops or clicks. No pedal edema. Gastrointestinal system: Abdomen is nondistended, soft but slightly tender in epigastric area. No organomegaly or masses felt.  Central nervous system: No focal neurological deficits. Extremities:  Symmetric 5 x 5 power.  Data Reviewed: I have personally reviewed following labs and imaging studies  CBC:  Recent Labs Lab 11/23/15 2300 11/24/15 0501 11/24/15 0635 11/24/15 2003 11/25/15 0914 11/26/15 0703  WBC 7.0  --   --  5.3  --  7.3  NEUTROABS 5.1  --   --   --   --   --   HGB 8.3* 7.9* 7.5* 8.3* 7.4* 7.7*  HCT 25.8* 24.4* 23.2* 24.3* 23.0* 24.2*  MCV 90.5  --   --  90.7  --  91.0  PLT 165  --   --  154  --  123456*   Basic Metabolic Panel:  Recent Labs Lab 11/23/15 2300 11/24/15 0635 11/24/15 2003 11/26/15 0703  NA 128* 128* 135 129*  K 5.2* 5.4* 3.2* 4.2  CL 90* 90* 96* 94*  CO2 18* 20* 28 26  GLUCOSE 91 87 72 76  BUN 105* 121* 30* 55*  CREATININE 6.75* 7.04* 2.69* 4.62*  CALCIUM 7.4* 7.2* 9.2 8.4*  PHOS  --  6.4* 4.4 5.2*   Liver Function Tests:  Recent Labs Lab 11/23/15 2300 11/24/15 0635 11/24/15 2003 11/26/15 0703  AST 16  --   --   --   ALT 10*  --   --   --   ALKPHOS 92  --   --   --   BILITOT 2.4*  --   --   --   PROT 6.0*  --   --   --   ALBUMIN 3.1* 2.7* 2.6* 2.6*    Recent Labs Lab 11/23/15 2300  LIPASE 19   Cardiac Enzymes:  Recent Labs Lab 11/23/15 2300 11/24/15 0635 11/24/15 2003 11/25/15 0154  TROPONINI 0.06* 0.05* 0.04* 0.03*   Urine analysis:    Component Value Date/Time   COLORURINE YELLOW 11/23/2015 2359   APPEARANCEUR CLEAR 11/23/2015 2359   LABSPEC 1.009 11/23/2015 2359   PHURINE 6.5 11/23/2015 2359   GLUCOSEU NEGATIVE 11/23/2015 2359   HGBUR SMALL* 11/23/2015 2359   BILIRUBINUR NEGATIVE 11/23/2015 2359   KETONESUR NEGATIVE 11/23/2015 2359   PROTEINUR 100* 11/23/2015 2359   UROBILINOGEN 0.2 01/26/2015 1748   NITRITE NEGATIVE 11/23/2015 2359   LEUKOCYTESUR NEGATIVE 11/23/2015 2359   Recent Results (from the past 240 hour(s))  Gastrointestinal Panel by PCR , Stool     Status: None   Collection Time: 11/23/15 10:07 PM  Result Value Ref Range Status   Campylobacter species NOT DETECTED NOT DETECTED Final     Plesimonas shigelloides NOT DETECTED NOT DETECTED Final   Salmonella species NOT DETECTED NOT DETECTED Final   Yersinia enterocolitica NOT DETECTED NOT DETECTED Final   Vibrio species NOT DETECTED NOT DETECTED Final   Vibrio cholerae NOT DETECTED NOT DETECTED Final   Enteroaggregative E coli (  EAEC) NOT DETECTED NOT DETECTED Final   Enteropathogenic E coli (EPEC) NOT DETECTED NOT DETECTED Final   Enterotoxigenic E coli (ETEC) NOT DETECTED NOT DETECTED Final   Shiga like toxin producing E coli (STEC) NOT DETECTED NOT DETECTED Final   E. coli O157 NOT DETECTED NOT DETECTED Final   Shigella/Enteroinvasive E coli (EIEC) NOT DETECTED NOT DETECTED Final   Cryptosporidium NOT DETECTED NOT DETECTED Final   Cyclospora cayetanensis NOT DETECTED NOT DETECTED Final   Entamoeba histolytica NOT DETECTED NOT DETECTED Final   Giardia lamblia NOT DETECTED NOT DETECTED Final   Adenovirus F40/41 NOT DETECTED NOT DETECTED Final   Astrovirus NOT DETECTED NOT DETECTED Final   Norovirus GI/GII NOT DETECTED NOT DETECTED Final   Rotavirus A NOT DETECTED NOT DETECTED Final   Sapovirus (I, II, IV, and V) NOT DETECTED NOT DETECTED Final  MRSA PCR Screening     Status: None   Collection Time: 11/24/15 11:02 PM  Result Value Ref Range Status   MRSA by PCR NEGATIVE NEGATIVE Final    Radiology Studies: Acute Abdominal Series 11/24/2015  Negative abdominal radiographs.  No acute cardiopulmonary disease.   Scheduled Meds: . sodium chloride   Intravenous Once  . albuterol      . cinacalcet  60 mg Oral Q lunch  . darbepoetin (ARANESP) injection - DIALYSIS  200 mcg Intravenous Q Thu-HD  . divalproex  500 mg Oral BID  . doxercalciferol  4 mcg Intravenous Q T,Th,Sa-HD  . feeding supplement (PRO-STAT SUGAR FREE 64)  30 mL Oral BID  . mometasone-formoterol  2 puff Inhalation BID  . multivitamin  1 tablet Oral QHS  . pantoprazole (PROTONIX) IV  40 mg Intravenous Q12H  . polyethylene glycol-electrolytes  4,000 mL Oral  Once  . sevelamer carbonate  1,600 mg Oral TID WC  . simvastatin  10 mg Oral QHS  . sodium chloride flush  3 mL Intravenous Q12H  . ziprasidone  60 mg Oral BID WC   Continuous Infusions:   Time spent: 20 minutes   Faye Ramsay, MD [facility] Pager [phone]  If 7PM-7AM, please contact night-coverage [email or web address] Password [email or web address] 11/26/2015, 1:36 PM

## 2015-11-26 NOTE — Progress Notes (Signed)
11/26/2015 2:19 PM  Pt started bowel prep around 1330, and since has drank approximately two cups.  Needs frequent reminders however for the time being, is completing prep as prescribed.  Will monitor. Princella Pellegrini

## 2015-11-26 NOTE — Progress Notes (Signed)
  Echocardiogram 2D Echocardiogram has been performed.  Jennifer Lawson M 11/26/2015, 3:01 PM

## 2015-11-27 ENCOUNTER — Encounter (HOSPITAL_COMMUNITY): Payer: Self-pay | Admitting: Certified Registered"

## 2015-11-27 DIAGNOSIS — D62 Acute posthemorrhagic anemia: Secondary | ICD-10-CM

## 2015-11-27 LAB — RENAL FUNCTION PANEL
Albumin: 2.6 g/dL — ABNORMAL LOW (ref 3.5–5.0)
Anion gap: 6 (ref 5–15)
BUN: 20 mg/dL (ref 6–20)
CO2: 27 mmol/L (ref 22–32)
Calcium: 8.9 mg/dL (ref 8.9–10.3)
Chloride: 97 mmol/L — ABNORMAL LOW (ref 101–111)
Creatinine, Ser: 2.72 mg/dL — ABNORMAL HIGH (ref 0.44–1.00)
GFR calc Af Amer: 21 mL/min — ABNORMAL LOW (ref >60)
GFR calc non Af Amer: 18 mL/min — ABNORMAL LOW (ref >60)
Glucose, Bld: 76 mg/dL (ref 65–99)
Phosphorus: 3.2 mg/dL (ref 2.5–4.6)
Potassium: 5.1 mmol/L (ref 3.5–5.1)
Sodium: 130 mmol/L — ABNORMAL LOW (ref 135–145)

## 2015-11-27 LAB — PROCALCITONIN: Procalcitonin: 1.16 ng/mL

## 2015-11-27 LAB — CBC
HCT: 24.2 % — ABNORMAL LOW (ref 36.0–46.0)
Hemoglobin: 7.7 g/dL — ABNORMAL LOW (ref 12.0–15.0)
MCH: 29.2 pg (ref 26.0–34.0)
MCHC: 31.8 g/dL (ref 30.0–36.0)
MCV: 91.7 fL (ref 78.0–100.0)
Platelets: 165 10*3/uL (ref 150–400)
RBC: 2.64 MIL/uL — ABNORMAL LOW (ref 3.87–5.11)
RDW: 17.9 % — ABNORMAL HIGH (ref 11.5–15.5)
WBC: 6.3 10*3/uL (ref 4.0–10.5)

## 2015-11-27 LAB — HEPATITIS B SURFACE ANTIGEN: Hepatitis B Surface Ag: NEGATIVE

## 2015-11-27 MED ORDER — LINACLOTIDE 290 MCG PO CAPS
290.0000 ug | ORAL_CAPSULE | Freq: Once | ORAL | Status: AC
  Administered 2015-11-27: 290 ug via ORAL
  Filled 2015-11-27: qty 1

## 2015-11-27 NOTE — Progress Notes (Signed)
Unfortunately, patient only consumed approximately one quarter of her gallon of Nulytely yesterday, and her last bowel movement was still soft solid, per discussion with nurse.  Therefore, her endoscopy and colonoscopy procedures for today have been postponed until tomorrow.  The patient is aware that my partner, Dr. Paulita Fujita, will be the one to do the procedures tomorrow.   The patient unfortunately has been somewhat uncooperative, complaining that she can't eat, etc. Nonetheless, we will try to work with her to achieve an adequate prep. She was given the option of not doing the procedures, but she states she would like to know "where the blood is coming from."  I attempted to reach the patient's daughter, [name], by telephone to let her know about the above, but her cell phone mailbox is full, and the home telephone had no answer and no answering machine.    Cleotis Nipper, M.D. Pager [phone] If no answer or after 5 PM call [phone]

## 2015-11-27 NOTE — Progress Notes (Signed)
Patient ID: Jennifer Lawson, female   DOB: 11-Jun-1960, 55 y.o.   MRN: [id number]     PROGRESS NOTE    Jennifer Lawson  [id number] DOB: 04-06-1961 DOA: 11/23/2015  [facility]: [facility]   Brief Narrative:  55 y.o. female with medical history significant of ESRD on HD, HTN, bipolar disorder, dyslipidemia; who presents with complaints of nausea, vomiting, and diarrhea that started 3 days PTA. This was associated with dark stools. Pt reports taking Goody powders occasionally, used last 2 weeks ago. Pt also reported missing her HD 3 day PTA due to her symptoms.   Upon review of records, patient was recently admitted to [facility] from 7/4 through 7/13; after being found acutely altered after missing 2 hemodialysis sessions. Patient underwent psychiatric evaluation and at discharge they recommended her to stop taking aspirin BC powder.   Assessment & Plan: GI bleed/ question of acute blood loss anemia - Guiac positive stools on admission - last EGD done by dr. Collene Mares in 2014 and was notable for moderate diffuse gastritis with some antral erosions secondary to NSAID use, no colonoscopy done based on the record review in EPIC  - pt has been transfused one unit PRBC but Hg still low at 7.7, no significant change in the past 24 hours  - GI consulted for further assistance, plan for endoscopy in AM One U PRBC transfused 7/19 and Hg up from 7.4 -->: 7.9 --> 7.7 --> 7.7 - continue protonix IV BID  - BMP in AM  SIRS, ? Sepsis - pt with T 96.8 F and one episode of T 100F, HR up to 116 bpm and initial BP 74/39, so meets sepsis criteria but no clear etiology, Tmax in the past 24 hours 99 F - ? If related to N/V/D, viral component  - lactic acid and procalcitonin pending   Nausea, vomiting, and diarrhea - unclear etiology, resolved for now  - vomiting could certainly be related to NSAID use but not sure about diarrhea - GI panel negative, PPI IV BID provided  - consulted GI  for assistance, plan for endoscopy in AM as pt had not completed her prep   ESRD on HD, Hypokalemia  - Patient normally dialyzes Tuesday/Thursday/Saturday,  - nephrology team following  - Continue Renvela, Sensipar   Troponins elevated - from demand ischemia rather than ACS - flat, no chest pain this AM - keep on tele, ECHO still pending   Hyponatremia - monitor for now, improving  - BMP in AM  Essential hypertension - was hypotensive earlier but now SBP in 100's  Bipolar disorder - Continue Depakote and Geodon - Discontinued Ativan   DVT prophylaxis: SCD's Code Status: Full Family Communication: Patient at bedside, called daughter but unable to leave message, voicemail full  Disposition Plan: to be determined   Consultants:   Nephrology   GI   Procedures:   None  Antimicrobials:   None   Subjective: Still confused but stable, has some nausea this am but wants to eat.   Objective: Filed Vitals:   11/26/15 2052 11/27/15 0512 11/27/15 0910 11/27/15 0950  BP: 133/68 147/91 162/94   Pulse: 94 88 91 90  Temp: 99.9 F (37.7 C) 98.8 F (37.1 C) 98.9 F (37.2 C)   TempSrc: Oral Oral Oral   Resp: 16 18 17 18   Weight:      SpO2: 100% 96% 95% 95%    Intake/Output Summary (Last 24 hours) at 11/27/15 1131 Last data filed at  11/27/15 1100  Gross per 24 hour  Intake   1200 ml  Output    150 ml  Net   1050 ml   Filed Weights   11/25/15 2117 11/26/15 0641 11/26/15 1057  Weight: 81 kg (178 lb 9.2 oz) 79.6 kg (175 lb 7.8 oz) 78.6 kg (173 lb 4.5 oz)    Examination:  General exam: Appears comfortable but says she is nauseous  Respiratory system: Respiratory effort normal. Diminished breath sounds at bases.  Cardiovascular system: S1 & S2 heard, tachycardia. no JVD, murmurs, rubs, gallops or clicks. No pedal edema. Gastrointestinal system: Abdomen is nondistended, soft but slightly tender in epigastric area.  Central nervous system: No focal neurological  deficits. Extremities: Symmetric 5 x 5 power.  Data Reviewed: I have personally reviewed following labs and imaging studies  CBC:  Recent Labs Lab 11/23/15 2300  11/24/15 0635 11/24/15 2003 11/25/15 0914 11/26/15 0703 11/27/15 0533  WBC 7.0  --   --  5.3  --  7.3 6.3  NEUTROABS 5.1  --   --   --   --   --   --   HGB 8.3*  < > 7.5* 8.3* 7.4* 7.7* 7.7*  HCT 25.8*  < > 23.2* 24.3* 23.0* 24.2* 24.2*  MCV 90.5  --   --  90.7  --  91.0 91.7  PLT 165  --   --  154  --  149* 165  < > = values in this interval not displayed. Basic Metabolic Panel:  Recent Labs Lab 11/23/15 2300 11/24/15 0635 11/24/15 2003 11/26/15 0703 11/27/15 0533  NA 128* 128* 135 129* 130*  K 5.2* 5.4* 3.2* 4.2 5.1  CL 90* 90* 96* 94* 97*  CO2 18* 20* 28 26 27   GLUCOSE 91 87 72 76 76  BUN 105* 121* 30* 55* 20  CREATININE 6.75* 7.04* 2.69* 4.62* 2.72*  CALCIUM 7.4* 7.2* 9.2 8.4* 8.9  PHOS  --  6.4* 4.4 5.2* 3.2   Liver Function Tests:  Recent Labs Lab 11/23/15 2300 11/24/15 0635 11/24/15 2003 11/26/15 0703 11/27/15 0533  AST 16  --   --   --   --   ALT 10*  --   --   --   --   ALKPHOS 92  --   --   --   --   BILITOT 2.4*  --   --   --   --   PROT 6.0*  --   --   --   --   ALBUMIN 3.1* 2.7* 2.6* 2.6* 2.6*    Recent Labs Lab 11/23/15 2300  LIPASE 19   Cardiac Enzymes:  Recent Labs Lab 11/23/15 2300 11/24/15 0635 11/24/15 2003 11/25/15 0154  TROPONINI 0.06* 0.05* 0.04* 0.03*   Urine analysis:    Component Value Date/Time   COLORURINE YELLOW 11/23/2015 2359   APPEARANCEUR CLEAR 11/23/2015 2359   LABSPEC 1.009 11/23/2015 2359   PHURINE 6.5 11/23/2015 2359   GLUCOSEU NEGATIVE 11/23/2015 2359   HGBUR SMALL* 11/23/2015 2359   BILIRUBINUR NEGATIVE 11/23/2015 2359   KETONESUR NEGATIVE 11/23/2015 2359   PROTEINUR 100* 11/23/2015 2359   UROBILINOGEN 0.2 01/26/2015 1748   NITRITE NEGATIVE 11/23/2015 2359   LEUKOCYTESUR NEGATIVE 11/23/2015 2359   Recent Results (from the past 240  hour(s))  Gastrointestinal Panel by PCR , Stool     Status: None   Collection Time: 11/23/15 10:07 PM  Result Value Ref Range Status   Campylobacter species NOT DETECTED NOT DETECTED Final  Plesimonas shigelloides NOT DETECTED NOT DETECTED Final   Salmonella species NOT DETECTED NOT DETECTED Final   Yersinia enterocolitica NOT DETECTED NOT DETECTED Final   Vibrio species NOT DETECTED NOT DETECTED Final   Vibrio cholerae NOT DETECTED NOT DETECTED Final   Enteroaggregative E coli (EAEC) NOT DETECTED NOT DETECTED Final   Enteropathogenic E coli (EPEC) NOT DETECTED NOT DETECTED Final   Enterotoxigenic E coli (ETEC) NOT DETECTED NOT DETECTED Final   Shiga like toxin producing E coli (STEC) NOT DETECTED NOT DETECTED Final   E. coli O157 NOT DETECTED NOT DETECTED Final   Shigella/Enteroinvasive E coli (EIEC) NOT DETECTED NOT DETECTED Final   Cryptosporidium NOT DETECTED NOT DETECTED Final   Cyclospora cayetanensis NOT DETECTED NOT DETECTED Final   Entamoeba histolytica NOT DETECTED NOT DETECTED Final   Giardia lamblia NOT DETECTED NOT DETECTED Final   Adenovirus F40/41 NOT DETECTED NOT DETECTED Final   Astrovirus NOT DETECTED NOT DETECTED Final   Norovirus GI/GII NOT DETECTED NOT DETECTED Final   Rotavirus A NOT DETECTED NOT DETECTED Final   Sapovirus (I, II, IV, and V) NOT DETECTED NOT DETECTED Final  MRSA PCR Screening     Status: None   Collection Time: 11/24/15 11:02 PM  Result Value Ref Range Status   MRSA by PCR NEGATIVE NEGATIVE Final    Radiology Studies: Acute Abdominal Series 11/24/2015  Negative abdominal radiographs.  No acute cardiopulmonary disease.   Scheduled Meds: . sodium chloride   Intravenous Once  . cinacalcet  60 mg Oral Q lunch  . darbepoetin (ARANESP) injection - DIALYSIS  200 mcg Intravenous Q Thu-HD  . divalproex  500 mg Oral BID  . doxercalciferol  4 mcg Intravenous Q T,Th,Sa-HD  . feeding supplement (PRO-STAT SUGAR FREE 64)  30 mL Oral BID  . linaclotide   290 mcg Oral Once  . mometasone-formoterol  2 puff Inhalation BID  . multivitamin  1 tablet Oral QHS  . pantoprazole (PROTONIX) IV  40 mg Intravenous Q12H  . sevelamer carbonate  1,600 mg Oral TID WC  . simvastatin  10 mg Oral QHS  . sodium chloride flush  3 mL Intravenous Q12H  . ziprasidone  60 mg Oral BID WC   Continuous Infusions:   Time spent: 20 minutes   Faye Ramsay, MD [facility] Pager [phone]  If 7PM-7AM, please contact night-coverage [email or web address] Password [facility] 11/27/2015, 11:31 AM

## 2015-11-27 NOTE — Progress Notes (Signed)
Patient requested breathing treatment. RT assessed patient and bilateral breath sounds are clear, O2 Sat is 95% on room air and patient is not in distress at this time. RT explained to patient that a breathing treatment is not indicated at this time. RT will continue to monitor as needed.

## 2015-11-27 NOTE — Care Management Important Message (Signed)
Important Message  Patient Details  Name: Jennifer Lawson MRN: [id number] Date of Birth: 08-26-1960   Medicare Important Message Given:  Yes    Loann Quill 11/27/2015, 8:31 AM

## 2015-11-27 NOTE — Progress Notes (Signed)
Subjective:  "when do I  have that stomach test?'' Next HD in am am TTS  schedule   Objective Vital signs in last 24 hours: Filed Vitals:   11/26/15 2052 11/27/15 0512 11/27/15 0910 11/27/15 0950  BP: 133/68 147/91 162/94   Pulse: 94 88 91 90  Temp: 99.9 F (37.7 C) 98.8 F (37.1 C) 98.9 F (37.2 C)   TempSrc: Oral Oral Oral   Resp: 16 18 17 18   Weight:      SpO2: 100% 96% 95% 95%   Weight change: -2.4 kg (-5 lb 4.7 oz)  Physical Exam: General:  Alert . OX3 very Talkative and has difficulty focusing on one topic and  not in distress  Lungs: CTA Breathing is unlabored. Heart: RRR . No mur. rubs, or gallops appreciated. Abdomen: Soft, non-tender, non-distended / pos BS  Extremities: Trace LE edema bilaterally.  Dialysis Access: RUA AVF + bruit  Dialysis Orders: [facility]-TTS 4 hours 2.0K/2.0 Ca 400/800 UF profile 2 Heparin: NONE Mircera 225 mcg IV q 2 weeks ( last dose 10/20/15-no recent in-center HGB) Hectoral 6 mcg IV q treatment (no recent BMD labs)  :Problem/Plan: 1. Acute Blood loss anemia: Per primary and GI  ( she did not drink prep evidently yest pre RN so  endoscopy and colonoscopy tomorrow ). HGB 7.4. 1 unit of PRBCs ordered HGB 7.7 this am . rec'd 1 unit of PRBCs 11/24/15 2. Possible Sepsis: Per primary. . T-max 100  WBC 6.3 / no antibio.  3. ESRD - TTS at [facility].  on schedule tomorrow. K+ now 5.1.  No heparin.  4. Hypertension/volume -SBP 120s-140s. Had HD 11/24/15 pre wt 77.3 became hypotensive while on HD, Net UF 1165 Post Wt 76.4 kg.  5. Anemia - As noted above. Give ESA  with HD. Check HGB prior to HD-  6. Metabolic bone disease - Ca corec  10,Phos 3.2. Continue binders, sensipar, lower VDRA dose. Check pth.  7. Elevated Troponin: Descending flat trend. Possible demand ischemia although difficult to evaluate with ESRD. 8. Nutrition - Albumin 2.6. K+5.1 now -currently on  Clear liq neds Carb mod /renal diet , . Renal  vit/prostat. 9. Bipolar disorder: per primary /about at [place], PA-C [place] [phone] 11/27/2015,10:03 AM  LOS: 2 days   Labs: Basic Metabolic Panel:  Recent Labs Lab 11/24/15 2003 11/26/15 0703 11/27/15 0533  NA 135 129* 130*  K 3.2* 4.2 5.1  CL 96* 94* 97*  CO2 28 26 27   GLUCOSE 72 76 76  BUN 30* 55* 20  CREATININE 2.69* 4.62* 2.72*  CALCIUM 9.2 8.4* 8.9  PHOS 4.4 5.2* 3.2   Liver Function Tests:  Recent Labs Lab 11/23/15 2300  11/24/15 2003 11/26/15 0703 11/27/15 0533  AST 16  --   --   --   --   ALT 10*  --   --   --   --   ALKPHOS 92  --   --   --   --   BILITOT 2.4*  --   --   --   --   PROT 6.0*  --   --   --   --   ALBUMIN 3.1*  < > 2.6* 2.6* 2.6*  < > = values in this interval not displayed.  Recent Labs Lab 11/23/15 2300  LIPASE 19   No results for input(s): AMMONIA in the last 168 hours. CBC:  Recent Labs Lab 11/23/15 2300  11/24/15 2003 11/25/15 0914 11/26/15 0703 11/27/15 0533  WBC 7.0  --  5.3  --  7.3 6.3  NEUTROABS 5.1  --   --   --   --   --   HGB 8.3*  < > 8.3* 7.4* 7.7* 7.7*  HCT 25.8*  < > 24.3* 23.0* 24.2* 24.2*  MCV 90.5  --  90.7  --  91.0 91.7  PLT 165  --  154  --  149* 165  < > = values in this interval not displayed. Cardiac Enzymes:  Recent Labs Lab 11/23/15 2300 11/24/15 0635 11/24/15 2003 11/25/15 0154  TROPONINI 0.06* 0.05* 0.04* 0.03*   CBG:  Recent Labs Lab 11/26/15 1128  GLUCAP 69    Studies/Results: No results found. Medications:   . sodium chloride   Intravenous Once  . cinacalcet  60 mg Oral Q lunch  . darbepoetin (ARANESP) injection - DIALYSIS  200 mcg Intravenous Q Thu-HD  . divalproex  500 mg Oral BID  . doxercalciferol  4 mcg Intravenous Q T,Th,Sa-HD  . feeding supplement (PRO-STAT SUGAR FREE 64)  30 mL Oral BID  . mometasone-formoterol  2 puff Inhalation BID  . multivitamin  1 tablet Oral QHS  . pantoprazole (PROTONIX) IV  40 mg  Intravenous Q12H  . sevelamer carbonate  1,600 mg Oral TID WC  . simvastatin  10 mg Oral QHS  . sodium chloride flush  3 mL Intravenous Q12H  . ziprasidone  60 mg Oral BID WC     I have seen and examined this patient and agree with plan as outlined by Ernest Haber, PA-C. [place] John A Caid Radin,MD 11/27/2015 11:16 AM

## 2015-11-27 NOTE — Evaluation (Signed)
Physical Therapy Evaluation Patient Details Name: Jennifer Lawson MRN: [id number] DOB: January 31, 1961 Today's Date: 11/27/2015   History of Present Illness  Patient is a 55 yo female admitted 11/23/15 with N/V, GIB, anemia.  Patient had recent extended stay at [facility] at [facility], and then was admitted 11/10/15-11/19/15 at [facility] after missing HD treatments.    PMH:  ESRD on HD, HTN, bipolar disorder, depression, PTSD, COPD, seizures, DM  Clinical Impression  Patient presents with problems listed below.  Will benefit from acute PT to maximize functional mobility and safety prior to discharge with daughter.  Recommend HHPT for continued therapy at d/c.    Follow Up Recommendations Home health PT;Supervision for mobility/OOB    Equipment Recommendations  None recommended by PT    Recommendations for Other Services       Precautions / Restrictions Precautions Precautions: None Restrictions Weight Bearing Restrictions: No      Mobility  Bed Mobility Overal bed mobility: Modified Independent                Transfers Overall transfer level: Needs assistance Equipment used: Rolling walker (2 wheeled) Transfers: Sit to/from Stand Sit to Stand: Supervision         General transfer comment: Supervision for safety  Ambulation/Gait Ambulation/Gait assistance: Supervision Ambulation Distance (Feet): 40 Feet Assistive device: Rolling walker (2 wheeled) Gait Pattern/deviations: Step-through pattern;Decreased stride length;Trunk flexed Gait velocity: decreased Gait velocity interpretation: Below normal speed for age/gender General Gait Details: Verbal cues for safe use of RW.  Patient with slow gait with RW.  Stops frequently due to distraction.  Patient parked RW at wall and held onto foot board to return to sitting EOB.  Cues to keep RW with her.  Patient focused on IV nurses entering room.  Stairs            Wheelchair Mobility    Modified Rankin (Stroke Patients Only)        Balance Overall balance assessment: Needs assistance         Standing balance support: Single extremity supported Standing balance-Leahy Scale: Poor Standing balance comment: UE support needed for balance                             Pertinent Vitals/Pain Pain Assessment: Faces Faces Pain Scale: Hurts even more Pain Location: Bil lower legs Pain Descriptors / Indicators: Aching;Sore Pain Intervention(s): Limited activity within patient's tolerance;Repositioned    Home Living Family/patient expects to be discharged to:: Unsure Living Arrangements: Children Available Help at Discharge: Family;Available PRN/intermittently (Daughter works 2nd shift) Type of Home: House Home Access: Level entry     Home Layout: One [place]: Environmental consultant - 4 wheels Additional Comments: Pt is a questionable historian, majority of information taken from previous submissions.    Prior Function Level of Independence: Independent with assistive device(s)         Comments: Pt reported she uses a rollator at home for mobility.     Hand Dominance        Extremity/Trunk Assessment   Upper Extremity Assessment: Overall WFL for tasks assessed           Lower Extremity Assessment: Generalized weakness         Communication   Communication: No difficulties  Cognition Arousal/Alertness: Awake/alert Behavior During Therapy: Flat affect;Impulsive Overall Cognitive Status: No family/caregiver present to determine baseline cognitive functioning (Tangential - difficulty staying on task to answer questions)  General Comments      Exercises        Assessment/Plan    PT Assessment Patient needs continued PT services  PT Diagnosis Abnormality of gait;Generalized weakness;Acute pain;Altered mental status   PT Problem List Decreased strength;Decreased activity tolerance;Decreased balance;Decreased mobility;Decreased  cognition;Decreased safety awareness;Pain  PT Treatment Interventions DME instruction;Gait training;Functional mobility training;Therapeutic activities;Patient/family education   PT Goals (Current goals can be found in the Care Plan section) Acute Rehab PT Goals Patient Stated Goal: None stated PT Goal Formulation: With patient Time For Goal Achievement: 12/04/15 Potential to Achieve Goals: Good    Frequency Min 3X/week   Barriers to discharge        Co-evaluation               End of Session Equipment Utilized During Treatment: Gait belt Activity Tolerance: Patient tolerated treatment well Patient left: in bed;with call bell/phone within reach (IV RN's in to start new IV) Nurse Communication: Mobility status         Time: [id number] PT Time Calculation (min) (ACUTE ONLY): 10 min   Charges:   PT Evaluation $PT Eval Moderate Complexity: 1 Procedure     PT G CodesDespina Lawson November 29, 2015, 7:18 PM Jennifer Lawson. Jennifer Lawson, [place] Pager [phone]

## 2015-11-28 ENCOUNTER — Encounter (HOSPITAL_COMMUNITY): Payer: Self-pay | Admitting: Certified Registered"

## 2015-11-28 ENCOUNTER — Encounter (HOSPITAL_COMMUNITY)
Admission: EM | Disposition: A | Discharge status: Home / Self Care | Payer: Self-pay | Source: Home / Self Care | Attending: Internal Medicine

## 2015-11-28 LAB — RENAL FUNCTION PANEL
Albumin: 2.9 g/dL — ABNORMAL LOW (ref 3.5–5.0)
Anion gap: 8 (ref 5–15)
BUN: 27 mg/dL — ABNORMAL HIGH (ref 6–20)
CO2: 26 mmol/L (ref 22–32)
Calcium: 9.3 mg/dL (ref 8.9–10.3)
Chloride: 100 mmol/L — ABNORMAL LOW (ref 101–111)
Creatinine, Ser: 3.5 mg/dL — ABNORMAL HIGH (ref 0.44–1.00)
GFR calc Af Amer: 16 mL/min — ABNORMAL LOW (ref >60)
GFR calc non Af Amer: 14 mL/min — ABNORMAL LOW (ref >60)
Glucose, Bld: 76 mg/dL (ref 65–99)
Phosphorus: 3.9 mg/dL (ref 2.5–4.6)
Potassium: 5.5 mmol/L — ABNORMAL HIGH (ref 3.5–5.1)
Sodium: 134 mmol/L — ABNORMAL LOW (ref 135–145)

## 2015-11-28 LAB — CBC
HCT: 26.8 % — ABNORMAL LOW (ref 36.0–46.0)
Hemoglobin: 8.4 g/dL — ABNORMAL LOW (ref 12.0–15.0)
MCH: 29 pg (ref 26.0–34.0)
MCHC: 31.3 g/dL (ref 30.0–36.0)
MCV: 92.4 fL (ref 78.0–100.0)
Platelets: 201 10*3/uL (ref 150–400)
RBC: 2.9 MIL/uL — ABNORMAL LOW (ref 3.87–5.11)
RDW: 17.1 % — ABNORMAL HIGH (ref 11.5–15.5)
WBC: 6.1 10*3/uL (ref 4.0–10.5)

## 2015-11-28 LAB — TROPONIN I: Troponin I: 0.03 ng/mL (ref <0.03)

## 2015-11-28 LAB — PROCALCITONIN: Procalcitonin: 0.78 ng/mL

## 2015-11-28 SURGERY — CANCELLED PROCEDURE

## 2015-11-28 MED ORDER — DOXERCALCIFEROL 4 MCG/2ML IV SOLN
INTRAVENOUS | Status: AC
  Filled 2015-11-28: qty 2

## 2015-11-28 MED ORDER — NICOTINE 21 MG/24HR TD PT24
21.0000 mg | MEDICATED_PATCH | TRANSDERMAL | Status: DC
  Administered 2015-11-28 – 2015-11-30 (×3): 21 mg via TRANSDERMAL
  Filled 2015-11-28 (×3): qty 1

## 2015-11-28 MED ORDER — POLYETHYLENE GLYCOL 3350 17 G PO PACK
17.0000 g | PACK | Freq: Three times a day (TID) | ORAL | Status: DC
  Administered 2015-11-28 – 2015-11-29 (×2): 17 g via ORAL
  Filled 2015-11-28 (×2): qty 1

## 2015-11-28 MED ORDER — ACETAMINOPHEN 325 MG PO TABS
ORAL_TABLET | ORAL | Status: AC
  Filled 2015-11-28: qty 2

## 2015-11-28 NOTE — Progress Notes (Signed)
Pt refused blood draw which includes troponins.  Pt upset and wants cigarette.

## 2015-11-28 NOTE — Progress Notes (Signed)
Inadequate response to bowel prep:  Still with semisolid brown stool.  Will stay on clear liquidsthis weekend and give Miralax 17 g po tid today.  Consider Moviprep tomorrow.  Goal for endoscopy and colonoscopy Monday, as long as we can coordinate it around her dialysis.  I'll review in detail with patient tomorrow.

## 2015-11-28 NOTE — Progress Notes (Signed)
Pt gone to dialysis 

## 2015-11-28 NOTE — Progress Notes (Signed)
Subjective: For HD today after Gi procedures, she denies cos but wants coffee ,stressed need npo until Gi procedure  Objective Vital signs in last 24 hours: Filed Vitals:   11/27/15 1750 11/27/15 2009 11/27/15 2217 11/28/15 0436  BP: 140/92  142/95 156/74  Pulse: 89  93 86  Temp: 98.4 F (36.9 C)  98.4 F (36.9 C) 98.5 F (36.9 C)  TempSrc: Oral  Oral Oral  Resp: 18  17 17   Weight:   85.1 kg (187 lb 9.8 oz)   SpO2: 96% 96% 100% 96%   Weight change: 6.5 kg (14 lb 5.3 oz)  Physical Exam: General: Alert . OX3  Again  Talkative and having  difficulty focusing on one topic and not in distress  Lungs: CTA Breathing is unlabored. Heart: RRR . No mur. rubs, or gallops appreciated. Abdomen: Soft, non-tender, non-distended / pos BS  Extremities: Trace LE edema bilaterally.  Dialysis Access: RUA AVF + bruit  Dialysis Orders: [facility]-TTS 4 hours 2.0K/2.0 Ca 400/800 UF profile 2 Heparin: NONE Mircera 225 mcg IV q 2 weeks ( last dose 10/20/15-no recent in-center HGB) Hectoral 6 mcg IV q treatment (no recent BMD labs)  :Problem/Plan: 1. Acute Blood loss anemia: Per primary and GI ( she did not drink prep evidently yest pre RN so endoscopy and colonoscopy tomorrow ).  HGB 7.7 >8.4 this am . rec'd 1 unit of PRBCs 11/24/15 2. ESRD - TTS at [facility]. on schedule. K+ 5.5. No heparin.  3. Hypertension/volume - ?? Wt accurate 85.1  Today when npo  bp 156/74 hd today  Fu stand wts and bp  Prior . HD 11/24/15 pre wt 77.3 became hypotensive while on HD, Net UF 1165 Post Wt 76.4 kg.  4. Anemia - As noted above8.4 hgb  Give MAx ESA with HDq thurs. Check HGB prior to HD-  5. Metabolic bone disease - Ca corec >10,Phos 3.9. Continue binders, sensipar, lower VDRA dose. Check pth.  use 2.0 ca bath  6. Elevated Troponin: Descending flat trend. Possible demand ischemia although difficult to evaluate with ESRD. 7. Nutrition - Albumin 2.9. K+5.5 now -currently onnpo  needs Carb mod /renal diet ,when eating  . Renal vit/prostat. 8. Bipolar disorder: per primary /about at [place], PA-C [place] [phone] 11/28/2015,8:03 AM  LOS: 3 days   Labs: Basic Metabolic Panel:  Recent Labs Lab 11/26/15 0703 11/27/15 0533 11/28/15 0536  NA 129* 130* 134*  K 4.2 5.1 5.5*  CL 94* 97* 100*  CO2 26 27 26   GLUCOSE 76 76 76  BUN 55* 20 27*  CREATININE 4.62* 2.72* 3.50*  CALCIUM 8.4* 8.9 9.3  PHOS 5.2* 3.2 3.9   Liver Function Tests:  Recent Labs Lab 11/23/15 2300  11/26/15 0703 11/27/15 0533 11/28/15 0536  AST 16  --   --   --   --   ALT 10*  --   --   --   --   ALKPHOS 92  --   --   --   --   BILITOT 2.4*  --   --   --   --   PROT 6.0*  --   --   --   --   ALBUMIN 3.1*  < > 2.6* 2.6* 2.9*  < > = values in this interval not displayed.  Recent Labs Lab 11/23/15 2300  LIPASE 19   No results for input(s): AMMONIA in the last 168 hours. CBC:  Recent Labs Lab 11/23/15 2300  11/24/15 2003  11/26/15 0703 11/27/15 0533 11/28/15 0536  WBC 7.0  --  5.3  --  7.3 6.3 6.1  NEUTROABS 5.1  --   --   --   --   --   --   HGB 8.3*  < > 8.3*  < > 7.7* 7.7* 8.4*  HCT 25.8*  < > 24.3*  < > 24.2* 24.2* 26.8*  MCV 90.5  --  90.7  --  91.0 91.7 92.4  PLT 165  --  154  --  149* 165 201  < > = values in this interval not displayed. Cardiac Enzymes:  Recent Labs Lab 11/23/15 2300 11/24/15 0635 11/24/15 2003 11/25/15 0154  TROPONINI 0.06* 0.05* 0.04* 0.03*   CBG:  Recent Labs Lab 11/26/15 1128  GLUCAP 69    Studies/Results: No results found. Medications:   . sodium chloride   Intravenous Once  . cinacalcet  60 mg Oral Q lunch  . darbepoetin (ARANESP) injection - DIALYSIS  200 mcg Intravenous Q Thu-HD  . divalproex  500 mg Oral BID  . doxercalciferol  4 mcg Intravenous Q T,Th,Sa-HD  . feeding supplement (PRO-STAT SUGAR FREE 64)  30 mL Oral BID  . mometasone-formoterol  2 puff Inhalation BID   . multivitamin  1 tablet Oral QHS  . pantoprazole (PROTONIX) IV  40 mg Intravenous Q12H  . sevelamer carbonate  1,600 mg Oral TID WC  . simvastatin  10 mg Oral QHS  . sodium chloride flush  3 mL Intravenous Q12H  . ziprasidone  60 mg Oral BID WC

## 2015-11-28 NOTE — Progress Notes (Signed)
Colonoscopy cancelled due to stool not clear.  Pt to be on clears at this time.  Pt also co chest pain and requested breathing tx, states has back pain when breathing in.  Paged Dr. Johny Drilling.

## 2015-11-28 NOTE — Progress Notes (Signed)
Patient ID: Jennifer Lawson, female   DOB: 04/02/1961, 55 y.o.   MRN: [id number]     PROGRESS NOTE    Jennifer Lawson  [id number] DOB: 1960/08/25 DOA: 11/23/2015  PCP: [facility]   Brief Narrative:  55 y.o. female with medical history significant of ESRD on HD, HTN, bipolar disorder, dyslipidemia; who presents with complaints of nausea, vomiting, and diarrhea that started 3 days PTA. This was associated with dark stools. Pt reports taking Goody powders occasionally, used last 2 weeks ago. Pt also reported missing her HD 3 day PTA due to her symptoms.   Upon review of records, patient was recently admitted to [facility] from 7/4 through 7/13; after being found acutely altered after missing 2 hemodialysis sessions. Patient underwent psychiatric evaluation and at discharge they recommended her to stop taking aspirin BC powder.   Assessment & Plan: GI bleed/ question of acute blood loss anemia - Guiac positive stools on admission - last EGD done by dr. Collene Mares in 2014 and was notable for moderate diffuse gastritis with some antral erosions secondary to NSAID use, no colonoscopy done based on the record review in [facility]  - pt has been transfused one unit PRBC day after admission  - GI consulted for further assistance, plan for endoscopy when pt able to complete prep  One U PRBC transfused 7/19 and Hg up from 7.4 -->: 7.9 --> 7.7 --> 7.7 --> 8.4 - continue protonix IV BID  - BMP in AM  Chest pain this AM - 12 lead EKG ordered - cycle CE's  SIRS, ? Sepsis - no fevers  - ? If related to N/V/D, viral component   Nausea, vomiting, and diarrhea - unclear etiology, resolved for now  - vomiting could certainly be related to NSAID use but not sure about diarrhea - GI panel negative, PPI IV BID provided  - consulted GI for assistance, plan for endoscopy once pt completes prep   ESRD on HD, Hyperkalemia  - Patient normally dialyzes Tuesday/Thursday/Saturday,  -  nephrology team following, may need to change bath with HD  - Continue Renvela, Sensipar   Hyponatremia - monitor for now, improving  - BMP in AM  Essential hypertension - overall stable   Bipolar disorder - Continue Depakote and Geodon - Discontinued Ativan   DVT prophylaxis: SCD's Code Status: Full Family Communication: Patient at bedside, called daughter but unable to leave message, voicemail full  Disposition Plan: to be determined   Consultants:   Nephrology   GI   Procedures:   None  Antimicrobials:   None   Subjective: Still confused but stable, has some nausea, intermittent chest pains.   Objective: Filed Vitals:   11/27/15 2009 11/27/15 2217 11/28/15 0436 11/28/15 0915  BP:  142/95 156/74 145/93  Pulse:  93 86 93  Temp:  98.4 F (36.9 C) 98.5 F (36.9 C) 99.5 F (37.5 C)  TempSrc:  Oral Oral Oral  Resp:  17 17 18   Weight:  85.1 kg (187 lb 9.8 oz)    SpO2: 96% 100% 96% 96%    Intake/Output Summary (Last 24 hours) at 11/28/15 1134 Last data filed at 11/28/15 1053  Gross per 24 hour  Intake   1320 ml  Output      0 ml  Net   1320 ml   Filed Weights   11/26/15 0641 11/26/15 1057 11/27/15 2217  Weight: 79.6 kg (175 lb 7.8 oz) 78.6 kg (173 lb 4.5 oz) 85.1 kg (187  lb 9.8 oz)    Examination:  General exam: Appears comfortable but says she is nauseous  Respiratory system: Respiratory effort normal. Diminished breath sounds at bases.  Cardiovascular system: S1 & S2 heard, tachycardia. no JVD, murmurs, rubs, gallops or clicks. No pedal edema. Gastrointestinal system: Abdomen is nondistended, soft but slightly tender in epigastric area.  Central nervous system: No focal neurological deficits. Extremities: Symmetric 5 x 5 power.  Data Reviewed: I have personally reviewed following labs and imaging studies  CBC:  Recent Labs Lab 11/23/15 2300  11/24/15 2003 11/25/15 0914 11/26/15 0703 11/27/15 0533 11/28/15 0536  WBC 7.0  --  5.3  --  7.3  6.3 6.1  NEUTROABS 5.1  --   --   --   --   --   --   HGB 8.3*  < > 8.3* 7.4* 7.7* 7.7* 8.4*  HCT 25.8*  < > 24.3* 23.0* 24.2* 24.2* 26.8*  MCV 90.5  --  90.7  --  91.0 91.7 92.4  PLT 165  --  154  --  149* 165 201  < > = values in this interval not displayed. Basic Metabolic Panel:  Recent Labs Lab 11/24/15 0635 11/24/15 2003 11/26/15 0703 11/27/15 0533 11/28/15 0536  NA 128* 135 129* 130* 134*  K 5.4* 3.2* 4.2 5.1 5.5*  CL 90* 96* 94* 97* 100*  CO2 20* 28 26 27 26   GLUCOSE 87 72 76 76 76  BUN 121* 30* 55* 20 27*  CREATININE 7.04* 2.69* 4.62* 2.72* 3.50*  CALCIUM 7.2* 9.2 8.4* 8.9 9.3  PHOS 6.4* 4.4 5.2* 3.2 3.9   Liver Function Tests:  Recent Labs Lab 11/23/15 2300 11/24/15 0635 11/24/15 2003 11/26/15 0703 11/27/15 0533 11/28/15 0536  AST 16  --   --   --   --   --   ALT 10*  --   --   --   --   --   ALKPHOS 92  --   --   --   --   --   BILITOT 2.4*  --   --   --   --   --   PROT 6.0*  --   --   --   --   --   ALBUMIN 3.1* 2.7* 2.6* 2.6* 2.6* 2.9*    Recent Labs Lab 11/23/15 2300  LIPASE 19   Cardiac Enzymes:  Recent Labs Lab 11/23/15 2300 11/24/15 0635 11/24/15 2003 11/25/15 0154  TROPONINI 0.06* 0.05* 0.04* 0.03*   Urine analysis:    Component Value Date/Time   COLORURINE YELLOW 11/23/2015 2359   APPEARANCEUR CLEAR 11/23/2015 2359   LABSPEC 1.009 11/23/2015 2359   PHURINE 6.5 11/23/2015 2359   GLUCOSEU NEGATIVE 11/23/2015 2359   HGBUR SMALL* 11/23/2015 2359   BILIRUBINUR NEGATIVE 11/23/2015 2359   KETONESUR NEGATIVE 11/23/2015 2359   PROTEINUR 100* 11/23/2015 2359   UROBILINOGEN 0.2 01/26/2015 1748   NITRITE NEGATIVE 11/23/2015 2359   LEUKOCYTESUR NEGATIVE 11/23/2015 2359   Recent Results (from the past 240 hour(s))  Gastrointestinal Panel by PCR , Stool     Status: None   Collection Time: 11/23/15 10:07 PM  Result Value Ref Range Status   Campylobacter species NOT DETECTED NOT DETECTED Final   Plesimonas shigelloides NOT DETECTED  NOT DETECTED Final   Salmonella species NOT DETECTED NOT DETECTED Final   Yersinia enterocolitica NOT DETECTED NOT DETECTED Final   Vibrio species NOT DETECTED NOT DETECTED Final   Vibrio cholerae NOT DETECTED NOT DETECTED Final  Enteroaggregative E coli (EAEC) NOT DETECTED NOT DETECTED Final   Enteropathogenic E coli (EPEC) NOT DETECTED NOT DETECTED Final   Enterotoxigenic E coli (ETEC) NOT DETECTED NOT DETECTED Final   Shiga like toxin producing E coli (STEC) NOT DETECTED NOT DETECTED Final   E. coli O157 NOT DETECTED NOT DETECTED Final   Shigella/Enteroinvasive E coli (EIEC) NOT DETECTED NOT DETECTED Final   Cryptosporidium NOT DETECTED NOT DETECTED Final   Cyclospora cayetanensis NOT DETECTED NOT DETECTED Final   Entamoeba histolytica NOT DETECTED NOT DETECTED Final   Giardia lamblia NOT DETECTED NOT DETECTED Final   Adenovirus F40/41 NOT DETECTED NOT DETECTED Final   Astrovirus NOT DETECTED NOT DETECTED Final   Norovirus GI/GII NOT DETECTED NOT DETECTED Final   Rotavirus A NOT DETECTED NOT DETECTED Final   Sapovirus (I, II, IV, and V) NOT DETECTED NOT DETECTED Final  MRSA PCR Screening     Status: None   Collection Time: 11/24/15 11:02 PM  Result Value Ref Range Status   MRSA by PCR NEGATIVE NEGATIVE Final    Radiology Studies: Acute Abdominal Series 11/24/2015  Negative abdominal radiographs.  No acute cardiopulmonary disease.   Scheduled Meds: . sodium chloride   Intravenous Once  . cinacalcet  60 mg Oral Q lunch  . darbepoetin (ARANESP) injection - DIALYSIS  200 mcg Intravenous Q Thu-HD  . divalproex  500 mg Oral BID  . doxercalciferol  4 mcg Intravenous Q T,Th,Sa-HD  . feeding supplement (PRO-STAT SUGAR FREE 64)  30 mL Oral BID  . mometasone-formoterol  2 puff Inhalation BID  . multivitamin  1 tablet Oral QHS  . pantoprazole (PROTONIX) IV  40 mg Intravenous Q12H  . sevelamer carbonate  1,600 mg Oral TID WC  . simvastatin  10 mg Oral QHS  . sodium chloride flush  3 mL  Intravenous Q12H  . ziprasidone  60 mg Oral BID WC   Continuous Infusions:   Time spent: 20 minutes   Faye Ramsay, MD [facility] Pager [phone]  If 7PM-7AM, please contact night-coverage [email or web address] Password [facility] 11/28/2015, 11:34 AM

## 2015-11-28 NOTE — Procedures (Signed)
Patient was seen on dialysis and the procedure was supervised.  BFR 350  Via AVF BP is  145/93.   Patient appears to be tolerating treatment well  Jennifer Lawson A 11/28/2015

## 2015-11-28 NOTE — Progress Notes (Signed)
Paged Dr. Johny Drilling, and needs nicotine patch.

## 2015-11-29 LAB — CBC
HCT: 23.9 % — ABNORMAL LOW (ref 36.0–46.0)
Hemoglobin: 7.5 g/dL — ABNORMAL LOW (ref 12.0–15.0)
MCH: 29.4 pg (ref 26.0–34.0)
MCHC: 31.4 g/dL (ref 30.0–36.0)
MCV: 93.7 fL (ref 78.0–100.0)
Platelets: 192 10*3/uL (ref 150–400)
RBC: 2.55 MIL/uL — ABNORMAL LOW (ref 3.87–5.11)
RDW: 17.3 % — ABNORMAL HIGH (ref 11.5–15.5)
WBC: 7.5 10*3/uL (ref 4.0–10.5)

## 2015-11-29 LAB — TROPONIN I
Troponin I: 0.03 ng/mL (ref <0.03)
Troponin I: 0.03 ng/mL (ref <0.03)
Troponin I: 0.03 ng/mL (ref <0.03)

## 2015-11-29 LAB — RENAL FUNCTION PANEL
Albumin: 2.8 g/dL — ABNORMAL LOW (ref 3.5–5.0)
Anion gap: 3 — ABNORMAL LOW (ref 5–15)
BUN: 13 mg/dL (ref 6–20)
CO2: 29 mmol/L (ref 22–32)
Calcium: 8.9 mg/dL (ref 8.9–10.3)
Chloride: 99 mmol/L — ABNORMAL LOW (ref 101–111)
Creatinine, Ser: 1.99 mg/dL — ABNORMAL HIGH (ref 0.44–1.00)
GFR calc Af Amer: 31 mL/min — ABNORMAL LOW (ref >60)
GFR calc non Af Amer: 27 mL/min — ABNORMAL LOW (ref >60)
Glucose, Bld: 87 mg/dL (ref 65–99)
Phosphorus: 3.1 mg/dL (ref 2.5–4.6)
Potassium: 6 mmol/L — ABNORMAL HIGH (ref 3.5–5.1)
Sodium: 131 mmol/L — ABNORMAL LOW (ref 135–145)

## 2015-11-29 LAB — PROCALCITONIN: Procalcitonin: 0.58 ng/mL

## 2015-11-29 MED ORDER — PEG-KCL-NACL-NASULF-NA ASC-C 100 G PO SOLR
0.5000 | Freq: Once | ORAL | Status: AC
  Administered 2015-11-29: 100 g via ORAL

## 2015-11-29 MED ORDER — SODIUM POLYSTYRENE SULFONATE 15 GM/60ML PO SUSP
45.0000 g | Freq: Once | ORAL | Status: AC
  Administered 2015-11-29: 45 g via ORAL
  Filled 2015-11-29: qty 180

## 2015-11-29 MED ORDER — PEG-KCL-NACL-NASULF-NA ASC-C 100 G PO SOLR
0.5000 | Freq: Once | ORAL | Status: AC
  Administered 2015-11-29: 100 g via ORAL
  Filled 2015-11-29: qty 1

## 2015-11-29 MED ORDER — PEG-KCL-NACL-NASULF-NA ASC-C 100 G PO SOLR: 1.0000 | Freq: Once | ORAL | Status: DC

## 2015-11-29 NOTE — Progress Notes (Signed)
Patient ID: Jennifer Lawson, female   DOB: Apr 13, 1961, 55 y.o.   MRN: [id number]     PROGRESS NOTE    RITAANN LEPPO  [id number] DOB: 1960-10-03 DOA: 11/23/2015  PCP: [facility]   Brief Narrative:  55 y.o. female with medical history significant of ESRD on HD, HTN, bipolar disorder, dyslipidemia; who presents with complaints of nausea, vomiting, and diarrhea that started 3 days PTA. This was associated with dark stools. Pt reports taking Goody powders occasionally, used last 2 weeks ago. Pt also reported missing her HD 3 day PTA due to her symptoms.   Upon review of records, patient was recently admitted to [facility] from 7/4 through 7/13; after being found acutely altered after missing 2 hemodialysis sessions. Patient underwent psychiatric evaluation and at discharge they recommended her to stop taking aspirin BC powder.   Assessment & Plan: GI bleed/ question of acute blood loss anemia - Guiac positive stools on admission - last EGD done by dr. Collene Mares in 2014 and was notable for moderate diffuse gastritis with some antral erosions secondary to NSAID use, no colonoscopy done based on the record review in [facility]  - pt has been transfused one unit PRBC day after admission  - GI consulted for further assistance, plan for endoscopy possibly today  One U PRBC transfused 7/19 and Hg up from 7.4 -->: 7.9 --> 7.7 --> 7.7 --> 8.4 - continue protonix IV BID   Chest pain this AM - 12 lead EKG ordered - no further chest pain   SIRS, ? Sepsis - no fevers  - ? If related to N/V/D, viral component   Nausea, vomiting, and diarrhea - unclear etiology, resolved for now  - vomiting could certainly be related to NSAID use but not sure about diarrhea - GI panel negative, PPI IV BID provided  - consulted GI for assistance, plan for endoscopy, maybe this afternoon - possible d/c after pending results   ESRD on HD, Hyperkalemia  - Patient normally dialyzes  Tuesday/Thursday/Saturday,  - nephrology team following, may need to change bath with HD  - Continue Renvela, Sensipar   Hyponatremia - monitor for now, improving  - pt refusing blood work   Essential hypertension - overall stable   Bipolar disorder - Continue Depakote and Geodon - Discontinued Ativan   DVT prophylaxis: SCD's Code Status: Full Family Communication: Patient at bedside, called daughter but unable to leave message, voicemail full  Disposition Plan: wants to go home after EGD  Consultants:   Nephrology   GI   Procedures:   None  Antimicrobials:   None   Subjective: Still confused but stable, has some nausea.  Objective: Vitals:   11/28/15 2130 11/29/15 0513 11/29/15 0857 11/29/15 0927  BP: 124/81 131/82  131/83  Pulse: 90 87  84  Resp: 18 20  18   Temp: 99.6 F (37.6 C) 99.6 F (37.6 C)  99 F (37.2 C)  TempSrc: Oral Oral  Oral  SpO2: 98% 96% 98% 96%  Weight:        Intake/Output Summary (Last 24 hours) at 11/29/15 1142 Last data filed at 11/29/15 0900  Gross per 24 hour  Intake              760 ml  Output             1503 ml  Net             -743 ml   Filed Weights   11/27/15  2217 11/28/15 1226 11/28/15 1626  Weight: 85.1 kg (187 lb 9.8 oz) 81.4 kg (179 lb 7.3 oz) 78.6 kg (173 lb 4.5 oz)    Examination:  General exam: Appears comfortable but says she is nauseous  Respiratory system: Respiratory effort normal. Diminished breath sounds at bases.  Cardiovascular system: S1 & S2 heard, tachycardia. no JVD, murmurs, rubs, gallops or clicks. No pedal edema. Gastrointestinal system: Abdomen is nondistended, soft but slightly tender in epigastric area.  Central nervous system: No focal neurological deficits. Extremities: Symmetric 5 x 5 power.  Data Reviewed: I have personally reviewed following labs and imaging studies  CBC:  Recent Labs Lab 11/23/15 2300  11/24/15 2003 11/25/15 0914 11/26/15 0703 11/27/15 0533 11/28/15 0536  11/29/15 0033  WBC 7.0  --  5.3  --  7.3 6.3 6.1 7.5  NEUTROABS 5.1  --   --   --   --   --   --   --   HGB 8.3*  < > 8.3* 7.4* 7.7* 7.7* 8.4* 7.5*  HCT 25.8*  < > 24.3* 23.0* 24.2* 24.2* 26.8* 23.9*  MCV 90.5  --  90.7  --  91.0 91.7 92.4 93.7  PLT 165  --  154  --  149* 165 201 192  < > = values in this interval not displayed. Basic Metabolic Panel:  Recent Labs Lab 11/24/15 2003 11/26/15 0703 11/27/15 0533 11/28/15 0536 11/29/15 0033  NA 135 129* 130* 134* 131*  K 3.2* 4.2 5.1 5.5* 6.0*  CL 96* 94* 97* 100* 99*  CO2 28 26 27 26 29   GLUCOSE 72 76 76 76 87  BUN 30* 55* 20 27* 13  CREATININE 2.69* 4.62* 2.72* 3.50* 1.99*  CALCIUM 9.2 8.4* 8.9 9.3 8.9  PHOS 4.4 5.2* 3.2 3.9 3.1   Liver Function Tests:  Recent Labs Lab 11/23/15 2300  11/24/15 2003 11/26/15 0703 11/27/15 0533 11/28/15 0536 11/29/15 0033  AST 16  --   --   --   --   --   --   ALT 10*  --   --   --   --   --   --   ALKPHOS 92  --   --   --   --   --   --   BILITOT 2.4*  --   --   --   --   --   --   PROT 6.0*  --   --   --   --   --   --   ALBUMIN 3.1*  < > 2.6* 2.6* 2.6* 2.9* 2.8*  < > = values in this interval not displayed.  Recent Labs Lab 11/23/15 2300  LIPASE 19   Cardiac Enzymes:  Recent Labs Lab 11/24/15 0635 11/24/15 2003 11/25/15 0154 11/28/15 2100 11/29/15 0540  TROPONINI 0.05* 0.04* 0.03* 0.03* 0.03*   Urine analysis:    Component Value Date/Time   COLORURINE YELLOW 11/23/2015 2359   APPEARANCEUR CLEAR 11/23/2015 2359   LABSPEC 1.009 11/23/2015 2359   PHURINE 6.5 11/23/2015 2359   GLUCOSEU NEGATIVE 11/23/2015 2359   HGBUR SMALL (A) 11/23/2015 2359   BILIRUBINUR NEGATIVE 11/23/2015 2359   KETONESUR NEGATIVE 11/23/2015 2359   PROTEINUR 100 (A) 11/23/2015 2359   UROBILINOGEN 0.2 01/26/2015 1748   NITRITE NEGATIVE 11/23/2015 2359   LEUKOCYTESUR NEGATIVE 11/23/2015 2359   Recent Results (from the past 240 hour(s))  Gastrointestinal Panel by PCR , Stool     Status: None  Collection Time: 11/23/15 10:07 PM  Result Value Ref Range Status   Campylobacter species NOT DETECTED NOT DETECTED Final   Plesimonas shigelloides NOT DETECTED NOT DETECTED Final   Salmonella species NOT DETECTED NOT DETECTED Final   Yersinia enterocolitica NOT DETECTED NOT DETECTED Final   Vibrio species NOT DETECTED NOT DETECTED Final   Vibrio cholerae NOT DETECTED NOT DETECTED Final   Enteroaggregative E coli (EAEC) NOT DETECTED NOT DETECTED Final   Enteropathogenic E coli (EPEC) NOT DETECTED NOT DETECTED Final   Enterotoxigenic E coli (ETEC) NOT DETECTED NOT DETECTED Final   Shiga like toxin producing E coli (STEC) NOT DETECTED NOT DETECTED Final   E. coli O157 NOT DETECTED NOT DETECTED Final   Shigella/Enteroinvasive E coli (EIEC) NOT DETECTED NOT DETECTED Final   Cryptosporidium NOT DETECTED NOT DETECTED Final   Cyclospora cayetanensis NOT DETECTED NOT DETECTED Final   Entamoeba histolytica NOT DETECTED NOT DETECTED Final   Giardia lamblia NOT DETECTED NOT DETECTED Final   Adenovirus F40/41 NOT DETECTED NOT DETECTED Final   Astrovirus NOT DETECTED NOT DETECTED Final   Norovirus GI/GII NOT DETECTED NOT DETECTED Final   Rotavirus A NOT DETECTED NOT DETECTED Final   Sapovirus (I, II, IV, and V) NOT DETECTED NOT DETECTED Final  MRSA PCR Screening     Status: None   Collection Time: 11/24/15 11:02 PM  Result Value Ref Range Status   MRSA by PCR NEGATIVE NEGATIVE Final    Radiology Studies: Acute Abdominal Series 11/24/2015  Negative abdominal radiographs.  No acute cardiopulmonary disease.   Scheduled Meds: . sodium chloride   Intravenous Once  . cinacalcet  60 mg Oral Q lunch  . darbepoetin (ARANESP) injection - DIALYSIS  200 mcg Intravenous Q Thu-HD  . divalproex  500 mg Oral BID  . doxercalciferol  4 mcg Intravenous Q T,Th,Sa-HD  . feeding supplement (PRO-STAT SUGAR FREE 64)  30 mL Oral BID  . mometasone-formoterol  2 puff Inhalation BID  . multivitamin  1 tablet Oral  QHS  . nicotine  21 mg Transdermal Q24H  . pantoprazole (PROTONIX) IV  40 mg Intravenous Q12H  . polyethylene glycol  17 g Oral TID  . sevelamer carbonate  1,600 mg Oral TID WC  . simvastatin  10 mg Oral QHS  . sodium chloride flush  3 mL Intravenous Q12H  . ziprasidone  60 mg Oral BID WC   Continuous Infusions:   Time spent: 20 minutes   Faye Ramsay, MD [facility] Pager [phone]  If 7PM-7AM, please contact night-coverage [email or web address] Password [facility] 11/29/2015, 11:42 AM

## 2015-11-29 NOTE — Progress Notes (Signed)
Patient ID: KINGA FLATHERS, female   DOB: 1961-03-01, 55 y.o.   MRN: [id number]  [facility] Progress Note   Assessment/ Plan:   1. Acute blood loss anemia with history suggestive of melena: Unfortunately unable to undergo endoscopy yesterday because of inadequate bowel prep-this has been postponed until tomorrow. We'll attempt to get the patient dialyzed earlier in the day (heparin free) because of her hyperkalemia to allow for EGD/colonoscopy in the afternoon as planned by Dr. Paulita Fujita. 2. ESRD: Underwent hemodialysis yesterday without problems-surprisingly hyperkalemic at 6.0 this morning (was 5.5 yesterday)-impressed upon low potassium diet and will treat with Kayexalate 45 g 1 dose. 3. Bipolar disorder: Management with ongoing psychotropic medications-mental status appears to be at baseline 4. CKD-MBD: Mildly hypercalcemic with corrected calcium >10, continue phosphorus binders, calcimimetic and low-dose VDRA/low calcium bath. 5. Nutrition: Low albumin level- apparently with chronic diarrhea and possible malabsorption. 6. Hypertension: Blood pressures currently acceptable, continue to monitor with dialysis/ultrafiltration  Subjective:   Asking about whether she can leave the hospital for the day and come back tomorrow for endoscopy-I informed her that this was not in her best interest and could not be recommended.    Objective:   BP 131/82 (BP Location: Left Arm)   Pulse 87   Temp 99.6 F (37.6 C) (Oral)   Resp 20   Wt 78.6 kg (173 lb 4.5 oz)   LMP 05/09/2008   SpO2 96%   BMI 33.84 kg/m   Physical Exam: [id number] sitting up in recliner CVS: Pulse regular rhythm, S1-S2 with ESM Resp: Clear to auscultation-no rales Abd: Soft, obese, nontender Ext: Trace lower extremity edema bilaterally. RUA AVF  Labs: BMET  Recent Labs Lab 11/23/15 2300 11/24/15 0635 11/24/15 2003 11/26/15 0703 11/27/15 0533 11/28/15 0536 11/29/15 0033  NA 128* 128* 135 129*  130* 134* 131*  K 5.2* 5.4* 3.2* 4.2 5.1 5.5* 6.0*  CL 90* 90* 96* 94* 97* 100* 99*  CO2 18* 20* 28 26 27 26 29   GLUCOSE 91 87 72 76 76 76 87  BUN 105* 121* 30* 55* 20 27* 13  CREATININE 6.75* 7.04* 2.69* 4.62* 2.72* 3.50* 1.99*  CALCIUM 7.4* 7.2* 9.2 8.4* 8.9 9.3 8.9  PHOS  --  6.4* 4.4 5.2* 3.2 3.9 3.1   CBC  Recent Labs Lab 11/23/15 2300  11/26/15 0703 11/27/15 0533 11/28/15 0536 11/29/15 0033  WBC 7.0  < > 7.3 6.3 6.1 7.5  NEUTROABS 5.1  --   --   --   --   --   HGB 8.3*  < > 7.7* 7.7* 8.4* 7.5*  HCT 25.8*  < > 24.2* 24.2* 26.8* 23.9*  MCV 90.5  < > 91.0 91.7 92.4 93.7  PLT 165  < > 149* 165 201 192  < > = values in this interval not displayed.  Medications:    . sodium chloride   Intravenous Once  . cinacalcet  60 mg Oral Q lunch  . darbepoetin (ARANESP) injection - DIALYSIS  200 mcg Intravenous Q Thu-HD  . divalproex  500 mg Oral BID  . doxercalciferol  4 mcg Intravenous Q T,Th,Sa-HD  . feeding supplement (PRO-STAT SUGAR FREE 64)  30 mL Oral BID  . mometasone-formoterol  2 puff Inhalation BID  . multivitamin  1 tablet Oral QHS  . nicotine  21 mg Transdermal Q24H  . pantoprazole (PROTONIX) IV  40 mg Intravenous Q12H  . polyethylene glycol  17 g Oral TID  . sevelamer carbonate  1,600 mg Oral  TID WC  . simvastatin  10 mg Oral QHS  . sodium chloride flush  3 mL Intravenous Q12H  . ziprasidone  60 mg Oral BID WC   Elmarie Shiley, MD 11/29/2015, 8:50 AM

## 2015-11-29 NOTE — Progress Notes (Signed)
Subjective: Frustrated that procedures weren't able to be done. Still passing dark brown semisolid stools.  Objective: Vital signs in last 24 hours: Temp:  [98.7 F (37.1 C)-99.6 F (37.6 C)] 99 F (37.2 C) (07/23 0927) Pulse Rate:  [84-93] 84 (07/23 0927) Resp:  [14-20] 18 (07/23 0927) BP: (114-131)/(57-85) 131/83 (07/23 0927) SpO2:  [95 %-98 %] 96 % (07/23 0927) Weight:  [78.6 kg (173 lb 4.5 oz)-81.4 kg (179 lb 7.3 oz)] 78.6 kg (173 lb 4.5 oz) (07/22 1626) Weight change: -3.7 kg (-8 lb 2.5 oz) Last BM Date: 11/29/15  PE: GEN:  Overweight, NAD NEURO:  Alert, oriented but frustrated and with volatile affect and mood  Lab Results: CBC    Component Value Date/Time   WBC 7.5 11/29/2015 0033   RBC 2.55 (L) 11/29/2015 0033   HGB 7.5 (L) 11/29/2015 0033   HCT 23.9 (L) 11/29/2015 0033   PLT 192 11/29/2015 0033   MCV 93.7 11/29/2015 0033   MCH 29.4 11/29/2015 0033   MCHC 31.4 11/29/2015 0033   RDW 17.3 (H) 11/29/2015 0033   LYMPHSABS 1.3 11/23/2015 2300   MONOABS 0.6 11/23/2015 2300   EOSABS 0.1 11/23/2015 2300   BASOSABS 0.0 11/23/2015 2300   CMP     Component Value Date/Time   NA 131 (L) 11/29/2015 0033   K 6.0 (H) 11/29/2015 0033   CL 99 (L) 11/29/2015 0033   CO2 29 11/29/2015 0033   GLUCOSE 87 11/29/2015 0033   BUN 13 11/29/2015 0033   CREATININE 1.99 (H) 11/29/2015 0033   CALCIUM 8.9 11/29/2015 0033   PROT 6.0 (L) 11/23/2015 2300   ALBUMIN 2.8 (L) 11/29/2015 0033   AST 16 11/23/2015 2300   ALT 10 (L) 11/23/2015 2300   ALKPHOS 92 11/23/2015 2300   BILITOT 2.4 (H) 11/23/2015 2300   GFRNONAA 27 (L) 11/29/2015 0033   GFRAA 31 (L) 11/29/2015 0033   Assessment:  1.  Anemia with hemoccult positive stools.  No overt GI tract bleeding. 2.  ESRD on dialysis, next session planned Monday 7/24. 3.  Bipolar disorder.  Plan:  1.  Administer more prep today (Had gallon Golytely Friday night 7/21 and received three doses of Miralax yesterday 7/23). 2.  If prep  successful, patient is tentatively planned for endoscopy and colonoscopy tomorrow, Monday 11/30/15. 3.  [facility] will follow.   Landry Dyke 11/29/2015, 12:17 PM   Pager [phone] If no answer or after 5 PM call [phone]

## 2015-11-30 ENCOUNTER — Encounter (HOSPITAL_COMMUNITY)
Admission: EM | Disposition: A | Discharge status: Home / Self Care | Payer: Self-pay | Source: Home / Self Care | Attending: Internal Medicine

## 2015-11-30 ENCOUNTER — Inpatient Hospital Stay (HOSPITAL_COMMUNITY): Payer: Medicare Other | Admitting: Certified Registered Nurse Anesthetist

## 2015-11-30 ENCOUNTER — Encounter (HOSPITAL_COMMUNITY): Payer: Self-pay | Admitting: *Deleted

## 2015-11-30 HISTORY — PX: ESOPHAGOGASTRODUODENOSCOPY: SHX5428

## 2015-11-30 HISTORY — PX: COLONOSCOPY: SHX5424

## 2015-11-30 LAB — POCT I-STAT, CHEM 8
BUN: 10 mg/dL (ref 6–20)
Calcium, Ion: 1.18 mmol/L (ref 1.13–1.30)
Chloride: 99 mmol/L — ABNORMAL LOW (ref 101–111)
Creatinine, Ser: 1.6 mg/dL — ABNORMAL HIGH (ref 0.44–1.00)
Glucose, Bld: 71 mg/dL (ref 65–99)
HCT: 27 % — ABNORMAL LOW (ref 36.0–46.0)
Hemoglobin: 9.2 g/dL — ABNORMAL LOW (ref 12.0–15.0)
Potassium: 3.8 mmol/L (ref 3.5–5.1)
Sodium: 138 mmol/L (ref 135–145)
TCO2: 29 mmol/L (ref 0–100)

## 2015-11-30 LAB — CBC
HCT: 23.5 % — ABNORMAL LOW (ref 36.0–46.0)
Hemoglobin: 7.4 g/dL — ABNORMAL LOW (ref 12.0–15.0)
MCH: 30 pg (ref 26.0–34.0)
MCHC: 31.5 g/dL (ref 30.0–36.0)
MCV: 95.1 fL (ref 78.0–100.0)
Platelets: 250 10*3/uL (ref 150–400)
RBC: 2.47 MIL/uL — ABNORMAL LOW (ref 3.87–5.11)
RDW: 17.1 % — ABNORMAL HIGH (ref 11.5–15.5)
WBC: 5.7 10*3/uL (ref 4.0–10.5)

## 2015-11-30 LAB — RENAL FUNCTION PANEL
Albumin: 2.7 g/dL — ABNORMAL LOW (ref 3.5–5.0)
Anion gap: 7 (ref 5–15)
BUN: 27 mg/dL — ABNORMAL HIGH (ref 6–20)
CO2: 27 mmol/L (ref 22–32)
Calcium: 9 mg/dL (ref 8.9–10.3)
Chloride: 106 mmol/L (ref 101–111)
Creatinine, Ser: 3.25 mg/dL — ABNORMAL HIGH (ref 0.44–1.00)
GFR calc Af Amer: 17 mL/min — ABNORMAL LOW (ref >60)
GFR calc non Af Amer: 15 mL/min — ABNORMAL LOW (ref >60)
Glucose, Bld: 77 mg/dL (ref 65–99)
Phosphorus: 4.7 mg/dL — ABNORMAL HIGH (ref 2.5–4.6)
Potassium: 5.1 mmol/L (ref 3.5–5.1)
Sodium: 140 mmol/L (ref 135–145)

## 2015-11-30 LAB — GLUCOSE, CAPILLARY: Glucose-Capillary: 82 mg/dL (ref 65–99)

## 2015-11-30 LAB — PROCALCITONIN: Procalcitonin: 0.32 ng/mL

## 2015-11-30 SURGERY — EGD (ESOPHAGOGASTRODUODENOSCOPY)
Anesthesia: Monitor Anesthesia Care

## 2015-11-30 SURGERY — CANCELLED PROCEDURE

## 2015-11-30 MED ORDER — ALBUTEROL SULFATE (2.5 MG/3ML) 0.083% IN NEBU
INHALATION_SOLUTION | RESPIRATORY_TRACT | Status: AC
  Filled 2015-11-30: qty 3

## 2015-11-30 MED ORDER — LORAZEPAM 2 MG/ML IJ SOLN
INTRAMUSCULAR | Status: AC
  Filled 2015-11-30: qty 1

## 2015-11-30 MED ORDER — PROPOFOL 500 MG/50ML IV EMUL
INTRAVENOUS | Status: DC | PRN
  Administered 2015-11-30: 100 ug/kg/min via INTRAVENOUS

## 2015-11-30 MED ORDER — PANTOPRAZOLE SODIUM 40 MG PO TBEC: 40.0000 mg | DELAYED_RELEASE_TABLET | Freq: Two times a day (BID) | ORAL | 0 refills | Status: DC

## 2015-11-30 MED ORDER — LIDOCAINE HCL (CARDIAC) 20 MG/ML IV SOLN
INTRAVENOUS | Status: DC | PRN
  Administered 2015-11-30: 80 mg via INTRATRACHEAL

## 2015-11-30 MED ORDER — FENTANYL CITRATE (PF) 100 MCG/2ML IJ SOLN
INTRAMUSCULAR | Status: DC | PRN
  Administered 2015-11-30 (×2): 25 ug via INTRAVENOUS

## 2015-11-30 MED ORDER — PROPOFOL 10 MG/ML IV BOLUS
INTRAVENOUS | Status: DC | PRN
  Administered 2015-11-30: 30 mg via INTRAVENOUS

## 2015-11-30 NOTE — H&P (View-Only) (Signed)
Subjective: Frustrated that procedures weren't able to be done. Still passing dark brown semisolid stools.  Objective: Vital signs in last 24 hours: Temp:  [98.7 F (37.1 C)-99.6 F (37.6 C)] 99 F (37.2 C) (07/23 0927) Pulse Rate:  [84-93] 84 (07/23 0927) Resp:  [14-20] 18 (07/23 0927) BP: (114-131)/(57-85) 131/83 (07/23 0927) SpO2:  [95 %-98 %] 96 % (07/23 0927) Weight:  [78.6 kg (173 lb 4.5 oz)-81.4 kg (179 lb 7.3 oz)] 78.6 kg (173 lb 4.5 oz) (07/22 1626) Weight change: -3.7 kg (-8 lb 2.5 oz) Last BM Date: 11/29/15  PE: GEN:  Overweight, NAD NEURO:  Alert, oriented but frustrated and with volatile affect and mood  Lab Results: CBC    Component Value Date/Time   WBC 7.5 11/29/2015 0033   RBC 2.55 (L) 11/29/2015 0033   HGB 7.5 (L) 11/29/2015 0033   HCT 23.9 (L) 11/29/2015 0033   PLT 192 11/29/2015 0033   MCV 93.7 11/29/2015 0033   MCH 29.4 11/29/2015 0033   MCHC 31.4 11/29/2015 0033   RDW 17.3 (H) 11/29/2015 0033   LYMPHSABS 1.3 11/23/2015 2300   MONOABS 0.6 11/23/2015 2300   EOSABS 0.1 11/23/2015 2300   BASOSABS 0.0 11/23/2015 2300   CMP     Component Value Date/Time   NA 131 (L) 11/29/2015 0033   K 6.0 (H) 11/29/2015 0033   CL 99 (L) 11/29/2015 0033   CO2 29 11/29/2015 0033   GLUCOSE 87 11/29/2015 0033   BUN 13 11/29/2015 0033   CREATININE 1.99 (H) 11/29/2015 0033   CALCIUM 8.9 11/29/2015 0033   PROT 6.0 (L) 11/23/2015 2300   ALBUMIN 2.8 (L) 11/29/2015 0033   AST 16 11/23/2015 2300   ALT 10 (L) 11/23/2015 2300   ALKPHOS 92 11/23/2015 2300   BILITOT 2.4 (H) 11/23/2015 2300   GFRNONAA 27 (L) 11/29/2015 0033   GFRAA 31 (L) 11/29/2015 0033   Assessment:  1.  Anemia with hemoccult positive stools.  No overt GI tract bleeding. 2.  ESRD on dialysis, next session planned Monday 7/24. 3.  Bipolar disorder.  Plan:  1.  Administer more prep today (Had gallon Golytely Friday night 7/21 and received three doses of Miralax yesterday 7/23). 2.  If prep  successful, patient is tentatively planned for endoscopy and colonoscopy tomorrow, Monday 11/30/15. 3.  [facility] will follow.   Landry Dyke 11/29/2015, 12:17 PM   Pager [phone] If no answer or after 5 PM call [phone]

## 2015-11-30 NOTE — Op Note (Signed)
[facility] Patient Name: Jennifer Lawson Procedure Date : 11/30/2015 MRN: [id number] Attending MD: Lear Ng , MD Date of Birth: [birth year] CSN: [id number] Age: 55 Admit Type: Inpatient Procedure:                Upper GI endoscopy Indications:              Iron deficiency anemia, Heme positive stool Providers:                Lear Ng, MD, Malka So, RN,                            Despina Pole Tech, Technician Referring MD:              Medicines:                Propofol per Anesthesia, Monitored Anesthesia Care Complications:            No immediate complications. Estimated Blood Loss:     Estimated blood loss was minimal. Procedure:                Pre-Anesthesia Assessment:                           - Prior to the procedure, a History and Physical                            was performed, and patient medications and                            allergies were reviewed. The patient's tolerance of                            previous anesthesia was also reviewed. The risks                            and benefits of the procedure and the sedation                            options and risks were discussed with the patient.                            All questions were answered, and informed consent                            was obtained. Prior Anticoagulants: The patient has                            taken no previous anticoagulant or antiplatelet                            agents. ASA Grade Assessment: III - A patient with                            severe systemic disease. After reviewing the risks  and benefits, the patient was deemed in                            satisfactory condition to undergo the procedure.                           After obtaining informed consent, the endoscope was                            passed under direct vision. Throughout the                            procedure, the patient's blood  pressure, pulse, and                            oxygen saturations were monitored continuously. The                            [id number]) scope was introduced through the                            mouth, and advanced to the second part of duodenum.                            The upper GI endoscopy was accomplished without                            difficulty. The patient tolerated the procedure                            well. Scope In: Scope Out: Findings:      LA Grade C (one or more mucosal breaks continuous between tops of 2 or       more mucosal folds, less than 75% circumference) esophagitis with no       bleeding was found in the lower third of the esophagus.      The Z-line was found 40 cm from the incisors.      Four non-bleeding cratered gastric ulcers with no stigmata of bleeding       were found in the gastric antrum. The largest lesion was 10 mm in       largest dimension.      Patchy moderate inflammation characterized by congestion (edema),       erythema and serpentine ulcerations was found in the gastric antrum.       Biopsies were taken with a cold forceps for histology. Estimated blood       loss was minimal.      The cardia and gastric fundus were normal on retroflexion.      The examined duodenum was normal. Impression:               - LA Grade C reflux esophagitis.                           - Z-line, 40 cm from the incisors.                           -  Non-bleeding gastric ulcers with no stigmata of                            bleeding.                           - Acute gastritis. Biopsied.                           - Normal examined duodenum. Moderate Sedation:      N/A - MAC procedure Recommendation:           - No aspirin, ibuprofen, naproxen, or other                            non-steroidal anti-inflammatory drugs.                           - Await pathology results.                           - Use Protonix (pantoprazole) 40 mg PO BID.                            - Advance diet as tolerated and soft diet. Procedure Code(s):        --- Professional ---                           [id number], Esophagogastroduodenoscopy, flexible,                            transoral; with biopsy, single or multiple Diagnosis Code(s):        --- Professional ---                           D50.9, Iron deficiency anemia, unspecified                           K25.9, Gastric ulcer, unspecified as acute or                            chronic, without hemorrhage or perforation                           K29.00, Acute gastritis without bleeding                           K21.0, Gastro-esophageal reflux disease with                            esophagitis                           R19.5, Other fecal abnormalities CPT copyright 2016 [employer]. All rights reserved. The codes documented in this report are preliminary and upon coder review may  be revised to meet current compliance requirements. Lear Ng, MD 11/30/2015 2:36:24 PM This report has been signed electronically. Number of  Addenda: 0

## 2015-11-30 NOTE — Progress Notes (Signed)
cbg checked on HD=82

## 2015-11-30 NOTE — Discharge Instructions (Signed)
Blood Transfusion, Care After °Refer to this sheet in the next few weeks. These instructions provide you with information about caring for yourself after your procedure. Your health care provider may also give you more specific instructions. Your treatment has been planned according to current medical practices, but problems sometimes occur. Call your health care provider if you have any problems or questions after your procedure. °WHAT TO EXPECT AFTER THE PROCEDURE °After your procedure, it is common to have: °· Bruising and soreness at the IV site. °· Chills or fever. °· Headache. °HOME CARE INSTRUCTIONS °· Take medicines only as directed by your health care provider. Ask your health care provider if you can take an over-the-counter pain reliever in case you have a fever or headache a day or two after your transfusion. °· Return to your normal activities as directed by your health care provider. °SEEK MEDICAL CARE IF:  °· You develop redness or irritation at your IV site. °· You have persistent fever, chills, or headache. °· Your urine is darker than normal. °· Your urine turns pink, red, or brown.   °· The white part of your eye turns yellow (jaundice).   °· You feel weak after doing your normal activities.   °SEEK IMMEDIATE MEDICAL CARE IF:  °· You have trouble breathing. °· You have fever and chills along with: °¨ Anxiety. °¨ Chest or back pain. °¨ Flushed skin. °¨ Clammy skin. °¨ A rapid heartbeat. °¨ Nausea. °  °This information is not intended to replace advice given to you by your health care provider. Make sure you discuss any questions you have with your health care provider. °  °Document Released: 05/16/2014 Document Reviewed: 05/16/2014 °Elsevier Interactive Patient Education ©2016 [employer] ° °

## 2015-11-30 NOTE — Progress Notes (Signed)
[facility] Progress Note   Subjective: "Can you let me go home now? I want to get some food..." Daughter at bedside, patient repeatedly asking me why she can't go home.  Denies C/O of abdominal pain/discomfort.     Objective Vitals:   11/30/15 1038 11/30/15 1044 11/30/15 1150 11/30/15 1435  BP: 120/61 (!) 117/44 117/74   Pulse: 92 95 93   Resp: 16  14   Temp: 97.6 F (36.4 C)   98.4 F (36.9 C)  TempSrc: Oral  Oral Oral  SpO2: 98%  96%   Weight: 75.1 kg (165 lb 9.1 oz)     Height:       Physical Exam: General: Alert, NAD Lungs: BBS CTA A/P Heart: RRR . No mur. rubs, or gallops appreciated. Abdomen: Soft, non-tender, non-distended / pos BS  Extremities: 1+ edema R pre tibia-Trace L pre tibia.  Dialysis Access: RUA AVF + bruit  Dialysis Orders: [facility]-TTS 4 hours 2.0K/2.0 Ca 400/800 UF profile 2 Heparin: NONE Mircera 225 mcg IV q 2 weeks ( last dose 10/20/15-no recent in-center HGB) Hectoral 6 mcg IV q treatment (no recent BMD labs)  Additional Objective Labs: Basic Metabolic Panel:  Recent Labs Lab 11/28/15 0536 11/29/15 0033 11/30/15 0716 11/30/15 1254  NA 134* 131* 140 138  K 5.5* 6.0* 5.1 3.8  CL 100* 99* 106 99*  CO2 26 29 27   --   GLUCOSE 76 87 77 71  BUN 27* 13 27* 10  CREATININE 3.50* 1.99* 3.25* 1.60*  CALCIUM 9.3 8.9 9.0  --   PHOS 3.9 3.1 4.7*  --    Liver Function Tests:  Recent Labs Lab 11/23/15 2300  11/28/15 0536 11/29/15 0033 11/30/15 0716  AST 16  --   --   --   --   ALT 10*  --   --   --   --   ALKPHOS 92  --   --   --   --   BILITOT 2.4*  --   --   --   --   PROT 6.0*  --   --   --   --   ALBUMIN 3.1*  < > 2.9* 2.8* 2.7*  < > = values in this interval not displayed.  Recent Labs Lab 11/23/15 2300  LIPASE 19   CBC:  Recent Labs Lab 11/23/15 2300  11/26/15 0703 11/27/15 0533 11/28/15 0536 11/29/15 0033 11/30/15 0716 11/30/15 1254  WBC 7.0  < > 7.3 6.3 6.1 7.5 5.7  --    NEUTROABS 5.1  --   --   --   --   --   --   --   HGB 8.3*  < > 7.7* 7.7* 8.4* 7.5* 7.4* 9.2*  HCT 25.8*  < > 24.2* 24.2* 26.8* 23.9* 23.5* 27.0*  MCV 90.5  < > 91.0 91.7 92.4 93.7 95.1  --   PLT 165  < > 149* 165 201 192 250  --   < > = values in this interval not displayed. Blood Culture    Component Value Date/Time   SDES BLOOD LEFT HAND 08/06/2015 0930   SPECREQUEST BOTTLES DRAWN AEROBIC AND ANAEROBIC 5CC 08/06/2015 0930   CULT NO GROWTH 5 DAYS 08/06/2015 0930   REPTSTATUS 08/11/2015 FINAL 08/06/2015 0930    Cardiac Enzymes:  Recent Labs Lab 11/25/15 0154 11/28/15 2100 11/29/15 0540 11/29/15 1130 11/29/15 1805  TROPONINI 0.03* 0.03* 0.03* 0.03* 0.03*   CBG:  Recent Labs Lab 11/26/15  1128 11/30/15 1005  GLUCAP 69 82   Medications:   . cinacalcet  60 mg Oral Q lunch  . darbepoetin (ARANESP) injection - DIALYSIS  200 mcg Intravenous Q Thu-HD  . divalproex  500 mg Oral BID  . doxercalciferol  4 mcg Intravenous Q T,Th,Sa-HD  . feeding supplement (PRO-STAT SUGAR FREE 64)  30 mL Oral BID  . mometasone-formoterol  2 puff Inhalation BID  . multivitamin  1 tablet Oral QHS  . nicotine  21 mg Transdermal Q24H  . pantoprazole (PROTONIX) IV  40 mg Intravenous Q12H  . sevelamer carbonate  1,600 mg Oral TID WC  . simvastatin  10 mg Oral QHS  . sodium chloride flush  3 mL Intravenous Q12H  . ziprasidone  60 mg Oral BID WC      Assessment/Plan: 1. Acute Blood loss anemia: Per primary and GI. Colonoscopy today per Dr. Suzzette Righter, Ulcerative Esophagitis/gastric ulcers noted, 2 polyps removed from colon. HGB 9.2 today.  2. ESRD - TTS at [facility]. Had HD off schedule today D/T hyperkalemia. K+ 5.1 after she rec'd kayexelate Sunday. K+ 3.8 post HD.Tolerated well, no issues.   3. Hypertension/volume - Pre wt 77.5 kg Net UF 2181 Post wt 75.1 kg however patient still has LE edema. May need to further lower EDW.  4. Anemia - As noted above. Last ESA dose07/20/17 Darbe 200 mcg IV.  Follow HGB.  5. Metabolic bone disease - Ca 9.0 C Ca 10 Phos 4.7. Continue binders, sensipar, lower VDRA dose. Check pth.  use 2.0 ca bath  6. Elevated Troponin: Descending flat trend. Possible demand ischemia although difficult to evaluate with ESRD. 7. Nutrition - Albumin 2.7.  needs Carb mod /renal diet ,when eating  . Renal vit/prostat.             8.   Bipolar disorder: per primary /about at [facility]  Disposition: Possible DC home tomorrow. Spoke with daughter about compliance to HD and adherence to renal diet.   Rita H. Brown NP-C 11/30/2015, 4:18 PM  [facility] [phone]   Pt seen, examined and agree w A/P as above.  Kelly Splinter MD [employer] pager [phone]    cell [phone] 12/01/2015, 9:21 AM

## 2015-11-30 NOTE — Progress Notes (Addendum)
PT Cancellation Note  Patient Details Name: Jennifer Lawson MRN: [id number] DOB: 04-Apr-1961   Cancelled Treatment:    Reason Eval/Treat Not Completed: Patient at procedure or test/unavailableOff floor at HD. Will follow up as time allows.   Attempted to see pt in PM and she is off floor in Endo. Will follow up.   Marguarite Arbour A Devontre Siedschlag 11/30/2015, 8:33 AM Wray Kearns, PT, DPT [phone]

## 2015-11-30 NOTE — Interval H&P Note (Signed)
History and Physical Interval Note:  11/30/2015 1:17 PM  Jennifer Lawson  has presented today for surgery, with the diagnosis of anemia  The various methods of treatment have been discussed with the patient and family. After consideration of risks, benefits and other options for treatment, the patient has consented to  Procedure(s): ESOPHAGOGASTRODUODENOSCOPY (EGD) (N/A) COLONOSCOPY (N/A) as a surgical intervention .  The patient's history has been reviewed, patient examined, no change in status, stable for surgery.  I have reviewed the patient's chart and labs.  Questions were answered to the patient's satisfaction.     [place] C.

## 2015-11-30 NOTE — Anesthesia Preprocedure Evaluation (Addendum)
Anesthesia Evaluation  Patient identified by MRN, date of birth, ID band Patient awake    Reviewed: Allergy & Precautions, NPO status , Patient's Chart, lab work & pertinent test results  Airway Mallampati: II  TM Distance: >3 FB Neck ROM: Full    Dental  (+) Edentulous Upper, Edentulous Lower   Pulmonary shortness of breath, asthma , Current Smoker,    breath sounds clear to auscultation       Cardiovascular hypertension, + Valvular Problems/Murmurs MR  Rhythm:Regular Rate:Normal     Neuro/Psych Depression Bipolar Disorder    GI/Hepatic GERD  ,  Endo/Other  diabetes  Renal/GU ESRFRenal disease     Musculoskeletal   Abdominal   Peds  Hematology  (+) anemia ,   Anesthesia Other Findings   Reproductive/Obstetrics                            Anesthesia Physical Anesthesia Plan  ASA: III  Anesthesia Plan: MAC   Post-op Pain Management:    Induction: Intravenous  Airway Management Planned: Natural Airway and Nasal Cannula  Additional Equipment:   Intra-op Plan:   Post-operative Plan:   Informed Consent: I have reviewed the patients History and Physical, chart, labs and discussed the procedure including the risks, benefits and alternatives for the proposed anesthesia with the patient or authorized representative who has indicated his/her understanding and acceptance.     Plan Discussed with: CRNA and Surgeon  Anesthesia Plan Comments:         Anesthesia Quick Evaluation

## 2015-11-30 NOTE — Transfer of Care (Signed)
Immediate Anesthesia Transfer of Care Note  Patient: Jennifer Lawson  Procedure(s) Performed: Procedure(s): ESOPHAGOGASTRODUODENOSCOPY (EGD) (N/A) COLONOSCOPY (N/A)  Patient Location: Endoscopy Unit  Anesthesia Type:MAC  Level of Consciousness: awake and alert   Airway & Oxygen Therapy: Patient Spontanous Breathing and Patient connected to nasal cannula oxygen  Post-op Assessment: Report given to RN and Post -op Vital signs reviewed and stable  Post vital signs: Reviewed and stable  Last Vitals:  Vitals:   11/30/15 1150 11/30/15 1435  BP: 117/74   Pulse: 93   Resp: 14   Temp:  36.9 C    Last Pain:  Vitals:   11/30/15 1435  TempSrc: Oral  PainSc:       Patients Stated Pain Goal: 0 ([id number])  Complications: No apparent anesthesia complications

## 2015-11-30 NOTE — Discharge Summary (Addendum)
Physician Discharge Summary  Jennifer Lawson [id number] DOB: 06/28/1960 DOA: 11/23/2015  PCP: [facility]  Admit date: 11/23/2015 Discharge date: 11/30/2015  Recommendations for Outpatient Follow-up:  1. Pt will need to follow up with PCP in 2-3 weeks post discharge  Discharge Diagnoses:  Principal Problem:   Anemia of renal disease Active Problems:   CKD (chronic kidney disease) stage V requiring chronic dialysis (HCC)   Bipolar affective disorder, current episode manic with psychotic symptoms (HCC)   GI bleed   Benign essential HTN   Acute blood loss anemia    Discharge Condition: Stable  Diet recommendation: renal diet         Brief Narrative:  55 y.o. female with medical history significant of ESRD on HD, HTN, bipolar disorder, dyslipidemia; who presents with complaints of nausea, vomiting, and diarrhea that started 3 days PTA. This was associated with dark stools. Pt reports taking Goody powders occasionally, used last 2 weeks ago. Pt also reported missing her HD 3 day PTA due to her symptoms.   Upon review of records, patient was recently admitted to [facility] from 7/4 through 7/13; after being found acutely altered after missing 2 hemodialysis sessions. Patient underwent psychiatric evaluation and at discharge they recommended her to stop taking aspirin BC powder.   Assessment & Plan: GI bleed secondary to Grade C esophagitis and Gastric ulcer - Guiac positive stools on admission - last EGD done by dr. Collene Mares in 2014 and was notable for moderate diffuse gastritis with some antral erosions secondary to NSAID use, no colonoscopy done based on the record review in EPIC  - please see EGD results below ulcerative esophagitis with clean base ulcer, need BID PPI upon discharge - if tolerating diet in AM can be discharged in AM  Chest pain this AM - 12 lead EKG ordered - no further chest pain   SIRS, ? Sepsis - no fevers  - ? If related to  N/V/D, viral component   Nausea, vomiting, and diarrhea - unclear etiology, resolved for now  - vomiting could certainly be related to NSAID use but not sure about diarrhea - GI panel negative, PPI IV BID provided   ESRD on HD, Hyperkalemia  - Patient normally dialyzes Tuesday/Thursday/Saturday,  - nephrology team following, may need to change bath with HD  - Continue Renvela, Sensipar   Hyponatremia - monitor for now, improving  - pt refusing blood work   Essential hypertension - overall stable   Bipolar disorder - Continue Depakote and Geodon - Discontinued Ativan   DVT prophylaxis: SCD's Code Status: Full Family Communication: Patient at bedside, called daughter but unable to leave message, voicemail full  Disposition Plan: home in AM  Consultants:   Nephrology   GI   Procedures:   EGD 7/24 --> Clean-based gastric ulcers noted; Ulcerative Esophagitis seen; Two polyps removed from colon (see proc reports for details). Continue IV PPI Q 12 hours until tolerating solid food and then change to PO BID. Soft diet and advance. If stable, then ok to go home tomorrow from a GI standpoint.    Antimicrobials:   None   Procedures/Studies: Acute Abdominal Series  Result Date: 11/24/2015 CLINICAL DATA:  Nausea and vomiting.  Diarrhea. EXAM: DG ABDOMEN ACUTE W/ 1V CHEST COMPARISON:  August 06, 2015 FINDINGS: Mild atelectasis in the left lung base. Stable cardiomegaly. The hila and mediastinum are unchanged. No pneumothorax. No pulmonary nodules, masses, or suspicious infiltrates. No free air, portal venous gas, or  pneumatosis. The bowel gas pattern is nonobstructive. No other acute abnormalities. IMPRESSION: Negative abdominal radiographs.  No acute cardiopulmonary disease. Electronically Signed   By: Dorise Bullion III M.D   On: 11/24/2015 03:59    Discharge Exam: Vitals:   11/30/15 1150 11/30/15 1435  BP: 117/74   Pulse: 93   Resp: 14   Temp:  98.4 F (36.9 C)    Vitals:   11/30/15 1038 11/30/15 1044 11/30/15 1150 11/30/15 1435  BP: 120/61 (!) 117/44 117/74   Pulse: 92 95 93   Resp: 16  14   Temp: 97.6 F (36.4 C)   98.4 F (36.9 C)  TempSrc: Oral  Oral Oral  SpO2: 98%  96%   Weight: 75.1 kg (165 lb 9.1 oz)     Height:        General: Pt is alert, follows commands appropriately, not in acute distress Cardiovascular: Regular rate and rhythm, S1/S2 +, no murmurs, no rubs, no gallops Respiratory: Clear to auscultation bilaterally, no wheezing, no crackles, no rhonchi Abdominal: Soft, non tender, non distended, bowel sounds +, no guarding Extremities: no edema, no cyanosis, pulses palpable bilaterally DP and PT Neuro: Grossly nonfocal  Discharge Instructions     Medication List    TAKE these medications   albuterol 108 (90 Base) MCG/ACT inhaler Commonly known as:  PROVENTIL HFA;VENTOLIN HFA Inhale 2 puffs into the lungs every 6 (six) hours as needed for wheezing or shortness of breath.   b complex-vitamin c-folic acid 0.8 MG Tabs tablet Take 1 tablet by mouth at bedtime.   cinacalcet 60 MG tablet Commonly known as:  SENSIPAR Take 1 tablet (60 mg total) by mouth daily. Takes with largest meal once daily   Darbepoetin Alfa 40 MCG/0.4ML Sosy injection Commonly known as:  ARANESP Inject 0.4 mLs (40 mcg total) into the vein every Saturday with hemodialysis.   divalproex 250 MG 24 hr tablet Commonly known as:  DEPAKOTE ER Take 500 mg by mouth 2 (two) times daily.   doxepin 25 MG capsule Commonly known as:  SINEQUAN Take 25 mg by mouth at bedtime.   DSS 100 MG Caps Take 100 mg by mouth 2 (two) times daily.   LORazepam 0.5 MG tablet Commonly known as:  ATIVAN Take 0.5 mg by mouth daily.   metoprolol tartrate 25 MG tablet Commonly known as:  LOPRESSOR Take 0.5 tablets (12.5 mg total) by mouth 2 (two) times daily.   mometasone-formoterol 100-5 MCG/ACT Aero Commonly known as:  DULERA Inhale 2 puffs into the lungs 2 (two)  times daily.   pantoprazole 40 MG tablet Commonly known as:  PROTONIX Take 1 tablet (40 mg total) by mouth 2 (two) times daily. What changed:  when to take this   sevelamer carbonate 800 MG tablet Commonly known as:  RENVELA Take 2 tablets (1,600 mg total) by mouth 3 (three) times daily with meals. Takes 2 tabs with each meal and snacks as directed   simvastatin 10 MG tablet Commonly known as:  ZOCOR Take 1 tablet (10 mg total) by mouth at bedtime. Reported on 05/30/2015   ziprasidone 60 MG capsule Commonly known as:  GEODON Take 2 capsules (120 mg total) by mouth daily with supper. What changed:  how much to take  when to take this          The results of significant diagnostics from this hospitalization (including imaging, microbiology, ancillary and laboratory) are listed below for reference.     Microbiology: Recent Results (from the  past 240 hour(s))  Gastrointestinal Panel by PCR , Stool     Status: None   Collection Time: 11/23/15 10:07 PM  Result Value Ref Range Status   Campylobacter species NOT DETECTED NOT DETECTED Final   Plesimonas shigelloides NOT DETECTED NOT DETECTED Final   Salmonella species NOT DETECTED NOT DETECTED Final   Yersinia enterocolitica NOT DETECTED NOT DETECTED Final   Vibrio species NOT DETECTED NOT DETECTED Final   Vibrio cholerae NOT DETECTED NOT DETECTED Final   Enteroaggregative E coli (EAEC) NOT DETECTED NOT DETECTED Final   Enteropathogenic E coli (EPEC) NOT DETECTED NOT DETECTED Final   Enterotoxigenic E coli (ETEC) NOT DETECTED NOT DETECTED Final   Shiga like toxin producing E coli (STEC) NOT DETECTED NOT DETECTED Final   E. coli O157 NOT DETECTED NOT DETECTED Final   Shigella/Enteroinvasive E coli (EIEC) NOT DETECTED NOT DETECTED Final   Cryptosporidium NOT DETECTED NOT DETECTED Final   Cyclospora cayetanensis NOT DETECTED NOT DETECTED Final   Entamoeba histolytica NOT DETECTED NOT DETECTED Final   Giardia lamblia NOT  DETECTED NOT DETECTED Final   Adenovirus F40/41 NOT DETECTED NOT DETECTED Final   Astrovirus NOT DETECTED NOT DETECTED Final   Norovirus GI/GII NOT DETECTED NOT DETECTED Final   Rotavirus A NOT DETECTED NOT DETECTED Final   Sapovirus (I, II, IV, and V) NOT DETECTED NOT DETECTED Final  MRSA PCR Screening     Status: None   Collection Time: 11/24/15 11:02 PM  Result Value Ref Range Status   MRSA by PCR NEGATIVE NEGATIVE Final    Comment:        The GeneXpert MRSA Assay (FDA approved for NASAL specimens only), is one component of a comprehensive MRSA colonization surveillance program. It is not intended to diagnose MRSA infection nor to guide or monitor treatment for MRSA infections.      Labs: Basic Metabolic Panel:  Recent Labs Lab 11/26/15 0703 11/27/15 0533 11/28/15 0536 11/29/15 0033 11/30/15 0716 11/30/15 1254  NA 129* 130* 134* 131* 140 138  K 4.2 5.1 5.5* 6.0* 5.1 3.8  CL 94* 97* 100* 99* 106 99*  CO2 26 27 26 29 27   --   GLUCOSE 76 76 76 87 77 71  BUN 55* 20 27* 13 27* 10  CREATININE 4.62* 2.72* 3.50* 1.99* 3.25* 1.60*  CALCIUM 8.4* 8.9 9.3 8.9 9.0  --   PHOS 5.2* 3.2 3.9 3.1 4.7*  --    Liver Function Tests:  Recent Labs Lab 11/23/15 2300  11/26/15 0703 11/27/15 0533 11/28/15 0536 11/29/15 0033 11/30/15 0716  AST 16  --   --   --   --   --   --   ALT 10*  --   --   --   --   --   --   ALKPHOS 92  --   --   --   --   --   --   BILITOT 2.4*  --   --   --   --   --   --   PROT 6.0*  --   --   --   --   --   --   ALBUMIN 3.1*  < > 2.6* 2.6* 2.9* 2.8* 2.7*  < > = values in this interval not displayed.  Recent Labs Lab 11/23/15 2300  LIPASE 19   No results for input(s): AMMONIA in the last 168 hours. CBC:  Recent Labs Lab 11/23/15 2300  11/26/15 0703 11/27/15 0533 11/28/15 [id number]  11/29/15 0033 11/30/15 0716 11/30/15 1254  WBC 7.0  < > 7.3 6.3 6.1 7.5 5.7  --   NEUTROABS 5.1  --   --   --   --   --   --   --   HGB 8.3*  < > 7.7* 7.7* 8.4*  7.5* 7.4* 9.2*  HCT 25.8*  < > 24.2* 24.2* 26.8* 23.9* 23.5* 27.0*  MCV 90.5  < > 91.0 91.7 92.4 93.7 95.1  --   PLT 165  < > 149* 165 201 192 250  --   < > = values in this interval not displayed. Cardiac Enzymes:  Recent Labs Lab 11/25/15 0154 11/28/15 2100 11/29/15 0540 11/29/15 1130 11/29/15 1805  TROPONINI 0.03* 0.03* 0.03* 0.03* 0.03*    CBG:  Recent Labs Lab 11/26/15 1128 11/30/15 1005  GLUCAP 69 82     SIGNED: Time coordinating discharge: 30 minutes  MAGICK-Keaundra Stehle, MD  [facility] 11/30/2015, 4:41 PM Pager [phone]  If 7PM-7AM, please contact night-coverage [email or web address] Password [email or web address]

## 2015-11-30 NOTE — Progress Notes (Signed)
Hemodialysis- Pt tolerated treatment well. 3.5 hours with 2L UF without issue. Pt continues to be agitated. Refused to stand for post weight. Endo is ready for patient per primary rn, will request transport. Report given to RN on 6E.

## 2015-11-30 NOTE — Brief Op Note (Signed)
Clean-based gastric ulcers noted; Ulcerative Esophagitis seen; Two polyps removed from colon (see proc reports for details). Continue IV PPI Q 12 hours until tolerating solid food and then change to PO BID. Soft diet and advance. If stable, then ok to go home tomorrow from a GI standpoint.

## 2015-11-30 NOTE — Op Note (Signed)
[facility] Patient Name: Jennifer Lawson Procedure Date : 11/30/2015 MRN: [id number] Attending MD: Lear Ng , MD Date of Birth: [birth year] CSN: [id number] Age: 56 Admit Type: Inpatient Procedure:                Colonoscopy Indications:              This is the patient's first colonoscopy, Heme                            positive stool, Iron deficiency anemia Providers:                Lear Ng, MD, Alinda Deem, RN, Despina Pole Tech, Technician Referring MD:              Medicines:                Propofol per Anesthesia, Monitored Anesthesia Care Complications:            No immediate complications. Estimated Blood Loss:     Estimated blood loss was minimal. Procedure:                Pre-Anesthesia Assessment:                           - Prior to the procedure, a History and Physical                            was performed, and patient medications and                            allergies were reviewed. The patient's tolerance of                            previous anesthesia was also reviewed. The risks                            and benefits of the procedure and the sedation                            options and risks were discussed with the patient.                            All questions were answered, and informed consent                            was obtained. Prior Anticoagulants: The patient has                            taken no previous anticoagulant or antiplatelet                            agents. ASA Grade Assessment: III - A patient with  severe systemic disease. After reviewing the risks                            and benefits, the patient was deemed in                            satisfactory condition to undergo the procedure.                           - Prior to the procedure, a History and Physical                            was performed, and patient medications and                         allergies were reviewed. The patient's tolerance of                            previous anesthesia was also reviewed. The risks                            and benefits of the procedure and the sedation                            options and risks were discussed with the patient.                            All questions were answered, and informed consent                            was obtained. Prior Anticoagulants: The patient has                            taken no previous anticoagulant or antiplatelet                            agents. ASA Grade Assessment: III - A patient with                            severe systemic disease. After reviewing the risks                            and benefits, the patient was deemed in                            satisfactory condition to undergo the procedure.                           After obtaining informed consent, the colonoscope                            was passed under direct vision. Throughout the  procedure, the patient's blood pressure, pulse, and                            oxygen saturations were monitored continuously. The                            [id number] scope was introduced through                            the anus and advanced to the the cecum, identified                            by appendiceal orifice and ileocecal valve. The                            colonoscopy was somewhat difficult due to                            significant looping and a tortuous colon.                            Successful completion of the procedure was aided by                            straightening and shortening the scope to obtain                            bowel loop reduction and applying abdominal                            pressure. The patient tolerated the procedure                            fairly well. The quality of the bowel preparation                            was adequate  and good. The ileocecal valve,                            appendiceal orifice, and rectum were photographed. Scope In: 1:55:34 PM Scope Out: 2:24:50 PM Scope Withdrawal Time: 0 hours 18 minutes 52 seconds  Total Procedure Duration: 0 hours 29 minutes 16 seconds  Findings:      The perianal and digital rectal examinations were normal.      A 16 mm polyp was found in the proximal ascending colon. The polyp was       sessile. The polyp was removed with a hot snare. Resection and retrieval       were complete. Estimated blood loss: none.      A 2 mm polyp was found in the proximal ascending colon. The polyp was       semi-sessile. The polyp was removed with a cold biopsy forceps.       Resection and retrieval were complete. Estimated blood loss was minimal.      Multiple small and large-mouthed diverticula were  found in the sigmoid       colon and descending colon.      Internal hemorrhoids were found during retroflexion. The hemorrhoids       were small and Grade I (internal hemorrhoids that do not prolapse). Impression:               - One 16 mm polyp in the proximal ascending colon,                            removed with a hot snare. Resected and retrieved.                           - One 2 mm polyp in the proximal ascending colon,                            removed with a cold biopsy forceps. Resected and                            retrieved.                           - Diverticulosis in the sigmoid colon and in the                            descending colon.                           - Internal hemorrhoids. Moderate Sedation:      N/A - MAC procedure Recommendation:           - Await pathology results.                           - No aspirin, ibuprofen, naproxen, or other                            non-steroidal anti-inflammatory drugs.                           - Repeat colonoscopy for surveillance based on                            pathology results.                           -  Advance diet as tolerated and soft diet. Procedure Code(s):        --- Professional ---                           [phone], Colonoscopy, flexible; with removal of                            tumor(s), polyp(s), or other lesion(s) by snare                            technique  [id number], 66, Colonoscopy, flexible; with biopsy,                            single or multiple Diagnosis Code(s):        --- Professional ---                           D50.9, Iron deficiency anemia, unspecified                           R19.5, Other fecal abnormalities                           D12.2, Benign neoplasm of ascending colon                           K64.0, First degree hemorrhoids                           K57.30, Diverticulosis of large intestine without                            perforation or abscess without bleeding CPT copyright 2016 [employer]. All rights reserved. The codes documented in this report are preliminary and upon coder review may  be revised to meet current compliance requirements. Lear Ng, MD 11/30/2015 2:44:32 PM This report has been signed electronically. Number of Addenda: 0

## 2015-11-30 NOTE — Progress Notes (Addendum)
Pt transported to endo. Attempted to place hospital gown on patient  (currently wearing robe) prior to transport. Refused d/t "Im cold."

## 2015-12-01 MED ORDER — PANTOPRAZOLE SODIUM 40 MG PO TBEC: 40.0000 mg | DELAYED_RELEASE_TABLET | Freq: Two times a day (BID) | ORAL | 0 refills | Status: AC

## 2015-12-01 MED ORDER — DIPHENHYDRAMINE HCL 25 MG PO CAPS
25.0000 mg | ORAL_CAPSULE | Freq: Three times a day (TID) | ORAL | Status: DC | PRN
  Administered 2015-12-01: 25 mg via ORAL
  Filled 2015-12-01: qty 1

## 2015-12-01 MED ORDER — PANTOPRAZOLE SODIUM 40 MG PO TBEC: 40.0000 mg | DELAYED_RELEASE_TABLET | Freq: Two times a day (BID) | ORAL | Status: DC

## 2015-12-01 MED ORDER — WHITE PETROLATUM GEL
Status: AC
Start: 2015-12-01 — End: 2015-12-01
  Administered 2015-12-01
  Filled 2015-12-01: qty 1

## 2015-12-01 NOTE — Progress Notes (Signed)
Pt seen and examined at bedside. Insists on going home. D/C summary done.  Faye Ramsay, MD  [facility] Pager [phone]  If 7PM-7AM, please contact night-coverage [email or web address] Password [email or web address]

## 2015-12-01 NOTE — Progress Notes (Signed)
Subjective: Patient feeling better. No acute issues overnight.   Objective: Vital signs in last 24 hours: Temp:  [97.6 F (36.4 C)-99.1 F (37.3 C)] 98.5 F (36.9 C) (07/25 0618) Pulse Rate:  [89-95] 95 (07/25 0618) Resp:  [14-20] 20 (07/25 0618) BP: (117-138)/(44-74) 138/72 (07/25 0618) SpO2:  [93 %-100 %] 93 % (07/25 0953) Weight:  [75.1 kg (165 lb 9.1 oz)-80.5 kg (177 lb 6.4 oz)] 80.5 kg (177 lb 6.4 oz) (07/24 2128) Last BM Date: 11/29/15  Intake/Output from previous day: 07/24 0701 - 07/25 0700 In: 463 [P.O.:460; I.V.:3] Out: 2181  Intake/Output this shift: Total I/O In: 120 [P.O.:120] Out: 0   General Appearance:  Alert, cooperative, no distress, appears stated age  Head:  Normocephalic, without obvious abnormality, atraumatic  Eyes:  , EOM's intact,   Lungs:   Clear to auscultation bilaterally, respirations unlabored  Heart:  Regular rate and rhythm, S1, S2 normal  Abdomen:   Soft, non-tender, bowel sounds active all four quadrants,  no masses,   Extremities: Trace edema   Lab Results:  Recent Labs  11/29/15 0033 11/30/15 0716 11/30/15 1254  WBC 7.5 5.7  --   HGB 7.5* 7.4* 9.2*  HCT 23.9* 23.5* 27.0*  PLT 192 250  --    BMET  Recent Labs  11/29/15 0033 11/30/15 0716 11/30/15 1254  NA 131* 140 138  K 6.0* 5.1 3.8  CL 99* 106 99*  CO2 29 27  --   GLUCOSE 87 77 71  BUN 13 27* 10  CREATININE 1.99* 3.25* 1.60*  CALCIUM 8.9 9.0  --    LFT  Recent Labs  11/30/15 0716  ALBUMIN 2.7*   PT/INR No results for input(s): LABPROT, INR in the last 72 hours. Hepatitis Panel No results for input(s): HEPBSAG, HCVAB, HEPAIGM, HEPBIGM in the last 72 hours. C-Diff No results for input(s): CDIFFTOX in the last 72 hours. No results for input(s): CDIFFPCR in the last 72 hours. Fecal Lactopherrin No results for input(s): FECLLACTOFRN in the last 72 hours.  Studies/Results: No results found.  Medications: I have reviewed the patient's current  medications.  Assessment/Plan:  Principal Problem:   Anemia of renal disease Active Problems:   CKD (chronic kidney disease) stage V requiring chronic dialysis (HCC)   Bipolar affective disorder, current episode manic with psychotic symptoms (HCC)   GI bleed   Benign essential HTN   Acute blood loss anemia  PLAN ------  S/P EGD and Colonoscopy Yesterday. Showed LA Grade C esophagitis and clean based Gastric ulcers.  Recommend repeat EGD in 2 to 3 months to document healing of Esophagitis. BID PPI for 8 weeks , followed by once a day.  Avoid NSAIDs Ok to Discharge from GI stand point.   LOS: 6 days   Sterling Ucci 12/01/2015, 10:36 AM

## 2015-12-01 NOTE — Progress Notes (Signed)
[facility] Progress Note    Subjective: "I'm supposed to go home..." Up in chair, NAD     Objective Vitals:   11/30/15 2024 11/30/15 2128 12/01/15 0618 12/01/15 0953  BP: 120/68  138/72   Pulse: 95  95   Resp: 20  20   Temp: 98.7 F (37.1 C)  98.5 F (36.9 C)   TempSrc:   Oral   SpO2: 100%  99% 93%  Weight:  80.5 kg (177 lb 6.4 oz)    Height:       Physical Exam: General: Alert, NAD Lungs: BBS CTA A/P Heart:RRR . No mur. rubs, or gallops appreciated. Abdomen: Soft, non-tender, non-distended / pos BS  Extremities:1+ edema R pre tibia-Trace L pre tibia.  Dialysis Access: RUA AVF + bruit  Dialysis Orders: [facility]-TTS 4 hours 2.0K/2.0 Ca 400/800 UF profile 2 Heparin: NONE Mircera 225 mcg IV q 2 weeks ( last dose 10/20/15-no recent in-center HGB) Hectoral 6 mcg IV q treatment (no recent BMD labs)  Additional Objective Labs: Basic Metabolic Panel:  Recent Labs Lab 11/28/15 0536 11/29/15 0033 11/30/15 0716 11/30/15 1254  NA 134* 131* 140 138  K 5.5* 6.0* 5.1 3.8  CL 100* 99* 106 99*  CO2 26 29 27   --   GLUCOSE 76 87 77 71  BUN 27* 13 27* 10  CREATININE 3.50* 1.99* 3.25* 1.60*  CALCIUM 9.3 8.9 9.0  --   PHOS 3.9 3.1 4.7*  --    Liver Function Tests:  Recent Labs Lab 11/28/15 0536 11/29/15 0033 11/30/15 0716  ALBUMIN 2.9* 2.8* 2.7*   CBC:  Recent Labs Lab 11/26/15 0703 11/27/15 0533 11/28/15 0536 11/29/15 0033 11/30/15 0716 11/30/15 1254  WBC 7.3 6.3 6.1 7.5 5.7  --   HGB 7.7* 7.7* 8.4* 7.5* 7.4* 9.2*  HCT 24.2* 24.2* 26.8* 23.9* 23.5* 27.0*  MCV 91.0 91.7 92.4 93.7 95.1  --   PLT 149* 165 201 192 250  --    Blood Culture    Component Value Date/Time   SDES BLOOD LEFT HAND 08/06/2015 0930   SPECREQUEST BOTTLES DRAWN AEROBIC AND ANAEROBIC 5CC 08/06/2015 0930   CULT NO GROWTH 5 DAYS 08/06/2015 0930   REPTSTATUS 08/11/2015 FINAL 08/06/2015 0930    Cardiac Enzymes:  Recent Labs Lab  11/25/15 0154 11/28/15 2100 11/29/15 0540 11/29/15 1130 11/29/15 1805  TROPONINI 0.03* 0.03* 0.03* 0.03* 0.03*   CBG:  Recent Labs Lab 11/26/15 1128 11/30/15 1005  GLUCAP 69 82   Iron Studies: No results for input(s): IRON, TIBC, TRANSFERRIN, FERRITIN in the last 72 hours. @lablastinr3 @ Studies/Results: No results found. Medications:   . cinacalcet  60 mg Oral Q lunch  . darbepoetin (ARANESP) injection - DIALYSIS  200 mcg Intravenous Q Thu-HD  . divalproex  500 mg Oral BID  . doxercalciferol  4 mcg Intravenous Q T,Th,Sa-HD  . feeding supplement (PRO-STAT SUGAR FREE 64)  30 mL Oral BID  . mometasone-formoterol  2 puff Inhalation BID  . multivitamin  1 tablet Oral QHS  . nicotine  21 mg Transdermal Q24H  . pantoprazole (PROTONIX) IV  40 mg Intravenous Q12H  . sevelamer carbonate  1,600 mg Oral TID WC  . simvastatin  10 mg Oral QHS  . sodium chloride flush  3 mL Intravenous Q12H  . ziprasidone  60 mg Oral BID WC   Assessment/Plan: 1. Acute Blood loss anemia:Per primary and GI. Colonoscopy today per Dr. Suzzette Righter, Ulcerative Esophagitis/gastric ulcers noted, 2 polyps removed from colon. HGB  9.2 11/30/15.  2. ESRD- TTS at [facility]. Had HD off schedule yesterday D/T hyperkalemia. K+ 5.1 after she rec'd kayexelate Sunday. K+ 3.8 post HD.Next HD Thursday as OP if she shows up for tx.  3. Hypertension/volume - HD 11/30/15. Pre wt 77.5 kg Net UF 2181 Post wt 75.1 kg however patient still has LE edema. May need to further lower EDW upon DC. .  4. Anemia - As noted above. Last ESA dose07/20/17 Darbe 200 mcg IV. Follow HGB.  5. Metabolic bone disease- Ca 9.0 C Ca 10 Phos 4.7. Continue binders, sensipar, lower VDRA dose. Check pth.  use 2.0 ca bath  6. Elevated Troponin:Descending flat trend. Possible demand ischemia although difficult to evaluate with ESRD. 7. Nutrition- Albumin 2.7.  needs Carb mod /renal diet ,when eating . Renal vit/prostat.                       8.    Bipolar disorder:per primary /about at [facility]  Disposition: DC home today. Spoke with daughter yesterday about compliance to HD and adherence to renal diet.    Rita H. Brown NP-C 12/01/2015, 11:24 AM  [facility] [phone]  Pt seen, examined and agree w A/P as above.  Kelly Splinter MD [employer] pager [phone]    cell [phone] 12/01/2015, 1:21 PM

## 2015-12-01 NOTE — Care Management Important Message (Signed)
Important Message  Patient Details  Name: Jennifer Lawson MRN: [id number] Date of Birth: July 07, 1960   Medicare Important Message Given:  Yes    Loann Quill 12/01/2015, 9:49 AM

## 2015-12-01 NOTE — Progress Notes (Signed)
0920H- Notified Dr. Doyle Askew that the patient was non-compliant this morning, refused tele and assessment. She verbalized, "I want to go home" frequently.

## 2015-12-01 NOTE — Progress Notes (Signed)
Jennifer Lawson to be D/C'd Home per MD order.  Discussed prescriptions and follow up appointments with the patient. Prescriptions given to patient, medication list explained in detail. Pt verbalized understanding.    Medication List    TAKE these medications   albuterol 108 (90 Base) MCG/ACT inhaler Commonly known as:  PROVENTIL HFA;VENTOLIN HFA Inhale 2 puffs into the lungs every 6 (six) hours as needed for wheezing or shortness of breath.   b complex-vitamin c-folic acid 0.8 MG Tabs tablet Take 1 tablet by mouth at bedtime.   cinacalcet 60 MG tablet Commonly known as:  SENSIPAR Take 1 tablet (60 mg total) by mouth daily. Takes with largest meal once daily   Darbepoetin Alfa 40 MCG/0.4ML Sosy injection Commonly known as:  ARANESP Inject 0.4 mLs (40 mcg total) into the vein every Saturday with hemodialysis.   divalproex 250 MG 24 hr tablet Commonly known as:  DEPAKOTE ER Take 500 mg by mouth 2 (two) times daily.   doxepin 25 MG capsule Commonly known as:  SINEQUAN Take 25 mg by mouth at bedtime.   DSS 100 MG Caps Take 100 mg by mouth 2 (two) times daily.   LORazepam 0.5 MG tablet Commonly known as:  ATIVAN Take 0.5 mg by mouth daily.   metoprolol tartrate 25 MG tablet Commonly known as:  LOPRESSOR Take 0.5 tablets (12.5 mg total) by mouth 2 (two) times daily.   mometasone-formoterol 100-5 MCG/ACT Aero Commonly known as:  DULERA Inhale 2 puffs into the lungs 2 (two) times daily.   pantoprazole 40 MG tablet Commonly known as:  PROTONIX Take 1 tablet (40 mg total) by mouth 2 (two) times daily. What changed:  when to take this   sevelamer carbonate 800 MG tablet Commonly known as:  RENVELA Take 2 tablets (1,600 mg total) by mouth 3 (three) times daily with meals. Takes 2 tabs with each meal and snacks as directed   simvastatin 10 MG tablet Commonly known as:  ZOCOR Take 1 tablet (10 mg total) by mouth at bedtime. Reported on 05/30/2015   ziprasidone 60 MG  capsule Commonly known as:  GEODON Take 2 capsules (120 mg total) by mouth daily with supper. What changed:  how much to take  when to take this       Vitals:   11/30/15 2024 12/01/15 0618  BP: 120/68 138/72  Pulse: 95 95  Resp: 20 20  Temp: 98.7 F (37.1 C) 98.5 F (36.9 C)    Skin clean, dry and intact without evidence of skin break down, no evidence of skin tears noted. IV catheter discontinued intact. Site without signs and symptoms of complications. Dressing and pressure applied. Pt denies pain at this time. No complaints noted.  An After Visit Summary was printed and given to the patient. Patient escorted via [place], and D/C home via private auto.  Retta Mac BSN, RN

## 2015-12-04 ENCOUNTER — Encounter (HOSPITAL_COMMUNITY): Payer: Self-pay | Admitting: Emergency Medicine

## 2015-12-04 ENCOUNTER — Emergency Department (HOSPITAL_COMMUNITY)
Admission: EM | Admit: 2015-12-04 | Discharge: 2015-12-06 | Disposition: A | Discharge status: Home / Self Care | Payer: Medicare Other | Attending: Emergency Medicine | Admitting: Emergency Medicine

## 2015-12-04 DIAGNOSIS — F3177 Bipolar disorder, in partial remission, most recent episode mixed: Secondary | ICD-10-CM

## 2015-12-04 DIAGNOSIS — F17210 Nicotine dependence, cigarettes, uncomplicated: Secondary | ICD-10-CM | POA: Diagnosis not present

## 2015-12-04 DIAGNOSIS — R0602 Shortness of breath: Secondary | ICD-10-CM | POA: Diagnosis not present

## 2015-12-04 DIAGNOSIS — Z5181 Encounter for therapeutic drug level monitoring: Secondary | ICD-10-CM | POA: Insufficient documentation

## 2015-12-04 DIAGNOSIS — N186 End stage renal disease: Secondary | ICD-10-CM

## 2015-12-04 DIAGNOSIS — D649 Anemia, unspecified: Secondary | ICD-10-CM | POA: Diagnosis not present

## 2015-12-04 DIAGNOSIS — I120 Hypertensive chronic kidney disease with stage 5 chronic kidney disease or end stage renal disease: Secondary | ICD-10-CM | POA: Diagnosis not present

## 2015-12-04 DIAGNOSIS — Z992 Dependence on renal dialysis: Secondary | ICD-10-CM | POA: Insufficient documentation

## 2015-12-04 DIAGNOSIS — E1122 Type 2 diabetes mellitus with diabetic chronic kidney disease: Secondary | ICD-10-CM | POA: Insufficient documentation

## 2015-12-04 DIAGNOSIS — J449 Chronic obstructive pulmonary disease, unspecified: Secondary | ICD-10-CM | POA: Insufficient documentation

## 2015-12-04 DIAGNOSIS — Z96651 Presence of right artificial knee joint: Secondary | ICD-10-CM | POA: Insufficient documentation

## 2015-12-04 DIAGNOSIS — R4182 Altered mental status, unspecified: Secondary | ICD-10-CM | POA: Diagnosis not present

## 2015-12-04 DIAGNOSIS — Z8673 Personal history of transient ischemic attack (TIA), and cerebral infarction without residual deficits: Secondary | ICD-10-CM | POA: Insufficient documentation

## 2015-12-04 LAB — CBG MONITORING, ED: Glucose-Capillary: 55 mg/dL — ABNORMAL LOW (ref 65–99)

## 2015-12-04 MED ORDER — CINACALCET HCL 30 MG PO TABS
60.0000 mg | ORAL_TABLET | Freq: Every day | ORAL | Status: DC
  Filled 2015-12-04 (×2): qty 2

## 2015-12-04 MED ORDER — GLUCAGON HCL RDNA (DIAGNOSTIC) 1 MG IJ SOLR
INTRAMUSCULAR | Status: AC
  Administered 2015-12-04: 1 mg via INTRAVENOUS
  Filled 2015-12-04: qty 1

## 2015-12-04 MED ORDER — GLUCOSE 40 % PO GEL
ORAL | Status: AC
  Filled 2015-12-04: qty 1

## 2015-12-04 MED ORDER — MOMETASONE FURO-FORMOTEROL FUM 100-5 MCG/ACT IN AERO: 2.0000 | INHALATION_SPRAY | Freq: Two times a day (BID) | RESPIRATORY_TRACT | Status: DC

## 2015-12-04 MED ORDER — GLUCAGON HCL RDNA (DIAGNOSTIC) 1 MG IJ SOLR
1.0000 mg | Freq: Once | INTRAMUSCULAR | Status: AC
  Administered 2015-12-04: 1 mg via INTRAVENOUS

## 2015-12-04 MED ORDER — DOXEPIN HCL 25 MG PO CAPS
25.0000 mg | ORAL_CAPSULE | Freq: Every day | ORAL | Status: DC
  Filled 2015-12-04: qty 1

## 2015-12-04 MED ORDER — ZIPRASIDONE MESYLATE 20 MG IM SOLR
20.0000 mg | Freq: Once | INTRAMUSCULAR | Status: AC
  Administered 2015-12-04: 20 mg via INTRAMUSCULAR
  Filled 2015-12-04: qty 20

## 2015-12-04 MED ORDER — ZIPRASIDONE HCL 20 MG PO CAPS: 120.0000 mg | ORAL_CAPSULE | Freq: Every day | ORAL | Status: DC

## 2015-12-04 MED ORDER — METOPROLOL TARTRATE 25 MG PO TABS
12.5000 mg | ORAL_TABLET | Freq: Two times a day (BID) | ORAL | Status: DC
  Filled 2015-12-04 (×2): qty 1

## 2015-12-04 MED ORDER — DIVALPROEX SODIUM ER 500 MG PO TB24
500.0000 mg | ORAL_TABLET | Freq: Two times a day (BID) | ORAL | Status: DC
  Filled 2015-12-04 (×2): qty 1

## 2015-12-04 MED ORDER — ZIPRASIDONE MESYLATE 20 MG IM SOLR
10.0000 mg | Freq: Four times a day (QID) | INTRAMUSCULAR | Status: DC | PRN
  Administered 2015-12-05: 10 mg via INTRAMUSCULAR
  Filled 2015-12-04 (×2): qty 20

## 2015-12-04 MED ORDER — SEVELAMER CARBONATE 800 MG PO TABS
1600.0000 mg | ORAL_TABLET | Freq: Three times a day (TID) | ORAL | Status: DC
  Filled 2015-12-04 (×4): qty 2

## 2015-12-04 MED ORDER — SIMVASTATIN 10 MG PO TABS
10.0000 mg | ORAL_TABLET | Freq: Every day | ORAL | Status: DC
  Filled 2015-12-04 (×3): qty 1

## 2015-12-04 NOTE — ED Notes (Signed)
Pt's CBG result was 55. Informed Woody - RN.

## 2015-12-04 NOTE — ED Notes (Signed)
TTS machine placed in room.  Sitter, daughter at bedside.

## 2015-12-04 NOTE — ED Triage Notes (Signed)
Pt arrives via EMS with worsening altered mental status, typically altered at baseline. Daughter states issues with potassium and bipolar meds. Pt states she's hungry. C/o back pain. Daughter wants pt to be seen, should be on the way.

## 2015-12-04 NOTE — ED Notes (Signed)
Gave pt [place] sandwich and graham crackers, per Starleen Blue - RN.

## 2015-12-04 NOTE — ED Notes (Signed)
Daughter [name]   [phone]
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
## 2015-12-05 DIAGNOSIS — F319 Bipolar disorder, unspecified: Secondary | ICD-10-CM

## 2015-12-05 LAB — CBC WITH DIFFERENTIAL/PLATELET
Basophils Absolute: 0.1 10*3/uL (ref 0.0–0.1)
Basophils Relative: 1 %
Eosinophils Absolute: 0.1 10*3/uL (ref 0.0–0.7)
Eosinophils Relative: 1 %
HCT: 25.4 % — ABNORMAL LOW (ref 36.0–46.0)
Hemoglobin: 7.6 g/dL — ABNORMAL LOW (ref 12.0–15.0)
Lymphocytes Relative: 29 %
Lymphs Abs: 2.5 10*3/uL (ref 0.7–4.0)
MCH: 28.7 pg (ref 26.0–34.0)
MCHC: 29.9 g/dL — ABNORMAL LOW (ref 30.0–36.0)
MCV: 95.8 fL (ref 78.0–100.0)
Monocytes Absolute: 0.5 10*3/uL (ref 0.1–1.0)
Monocytes Relative: 6 %
Neutro Abs: 5.4 10*3/uL (ref 1.7–7.7)
Neutrophils Relative %: 63 %
Platelets: 251 10*3/uL (ref 150–400)
RBC: 2.65 MIL/uL — ABNORMAL LOW (ref 3.87–5.11)
RDW: 18.5 % — ABNORMAL HIGH (ref 11.5–15.5)
WBC: 8.6 10*3/uL (ref 4.0–10.5)

## 2015-12-05 LAB — BASIC METABOLIC PANEL
Anion gap: 11 (ref 5–15)
Anion gap: 9 (ref 5–15)
BUN: 31 mg/dL — ABNORMAL HIGH (ref 6–20)
BUN: 10 mg/dL (ref 6–20)
CO2: 23 mmol/L (ref 22–32)
CO2: 27 mmol/L (ref 22–32)
Calcium: 8.8 mg/dL — ABNORMAL LOW (ref 8.9–10.3)
Calcium: 8.9 mg/dL (ref 8.9–10.3)
Chloride: 103 mmol/L (ref 101–111)
Chloride: 98 mmol/L — ABNORMAL LOW (ref 101–111)
Creatinine, Ser: 4.57 mg/dL — ABNORMAL HIGH (ref 0.44–1.00)
Creatinine, Ser: 2.56 mg/dL — ABNORMAL HIGH (ref 0.44–1.00)
GFR calc Af Amer: 11 mL/min — ABNORMAL LOW (ref >60)
GFR calc Af Amer: 23 mL/min — ABNORMAL LOW (ref >60)
GFR calc non Af Amer: 10 mL/min — ABNORMAL LOW (ref >60)
GFR calc non Af Amer: 20 mL/min — ABNORMAL LOW (ref >60)
Glucose, Bld: 41 mg/dL — CL (ref 65–99)
Glucose, Bld: 90 mg/dL (ref 65–99)
Potassium: 4 mmol/L (ref 3.5–5.1)
Potassium: 3.5 mmol/L (ref 3.5–5.1)
Sodium: 137 mmol/L (ref 135–145)
Sodium: 134 mmol/L — ABNORMAL LOW (ref 135–145)

## 2015-12-05 LAB — URINALYSIS, ROUTINE W REFLEX MICROSCOPIC
Bilirubin Urine: NEGATIVE
Glucose, UA: NEGATIVE mg/dL
Hgb urine dipstick: NEGATIVE
Ketones, ur: NEGATIVE mg/dL
Nitrite: NEGATIVE
Protein, ur: 100 mg/dL — AB
Specific Gravity, Urine: 1.006 (ref 1.005–1.030)
pH: 7.5 (ref 5.0–8.0)

## 2015-12-05 LAB — CBG MONITORING, ED
Glucose-Capillary: 79 mg/dL (ref 65–99)
Glucose-Capillary: 80 mg/dL (ref 65–99)
Glucose-Capillary: 60 mg/dL — ABNORMAL LOW (ref 65–99)
Glucose-Capillary: 85 mg/dL (ref 65–99)
Glucose-Capillary: 85 mg/dL (ref 65–99)

## 2015-12-05 LAB — PREPARE RBC (CROSSMATCH)

## 2015-12-05 LAB — RAPID URINE DRUG SCREEN, HOSP PERFORMED
Amphetamines: NOT DETECTED
Barbiturates: NOT DETECTED
Benzodiazepines: NOT DETECTED
Cocaine: NOT DETECTED
Opiates: NOT DETECTED
Tetrahydrocannabinol: NOT DETECTED

## 2015-12-05 LAB — VALPROIC ACID LEVEL: Valproic Acid Lvl: 13 ug/mL — ABNORMAL LOW (ref 50.0–100.0)

## 2015-12-05 LAB — CBC
HCT: 23.6 % — ABNORMAL LOW (ref 36.0–46.0)
Hemoglobin: 7.2 g/dL — ABNORMAL LOW (ref 12.0–15.0)
MCH: 29.3 pg (ref 26.0–34.0)
MCHC: 30.5 g/dL (ref 30.0–36.0)
MCV: 95.9 fL (ref 78.0–100.0)
Platelets: 248 10*3/uL (ref 150–400)
RBC: 2.46 MIL/uL — ABNORMAL LOW (ref 3.87–5.11)
RDW: 18.5 % — ABNORMAL HIGH (ref 11.5–15.5)
WBC: 6.8 10*3/uL (ref 4.0–10.5)

## 2015-12-05 LAB — URINE MICROSCOPIC-ADD ON

## 2015-12-05 LAB — TSH: TSH: 0.702 u[IU]/mL (ref 0.350–4.500)

## 2015-12-05 LAB — BRAIN NATRIURETIC PEPTIDE: B Natriuretic Peptide: 155.4 pg/mL — ABNORMAL HIGH (ref 0.0–100.0)

## 2015-12-05 MED ORDER — ACETAMINOPHEN 325 MG PO TABS
ORAL_TABLET | ORAL | Status: AC
  Filled 2015-12-05: qty 2

## 2015-12-05 MED ORDER — ONDANSETRON HCL 4 MG PO TABS: 4.0000 mg | ORAL_TABLET | Freq: Four times a day (QID) | ORAL | Status: DC | PRN

## 2015-12-05 MED ORDER — ZIPRASIDONE MESYLATE 20 MG IM SOLR
10.0000 mg | Freq: Once | INTRAMUSCULAR | Status: AC
  Administered 2015-12-05: 10 mg via INTRAMUSCULAR
  Filled 2015-12-05: qty 20

## 2015-12-05 MED ORDER — SORBITOL 70 % SOLN
30.0000 mL | Status: DC | PRN
  Filled 2015-12-05: qty 30

## 2015-12-05 MED ORDER — ACETAMINOPHEN 650 MG RE SUPP: 650.0000 mg | Freq: Four times a day (QID) | RECTAL | Status: DC | PRN

## 2015-12-05 MED ORDER — ONDANSETRON HCL 4 MG/2ML IJ SOLN: 4.0000 mg | Freq: Four times a day (QID) | INTRAMUSCULAR | Status: DC | PRN

## 2015-12-05 MED ORDER — DARBEPOETIN ALFA 100 MCG/0.5ML IJ SOSY
PREFILLED_SYRINGE | INTRAMUSCULAR | Status: AC
  Filled 2015-12-05: qty 0.5

## 2015-12-05 MED ORDER — ZIPRASIDONE HCL 60 MG PO CAPS: 60.0000 mg | ORAL_CAPSULE | Freq: Two times a day (BID) | ORAL | 0 refills | Status: DC

## 2015-12-05 MED ORDER — DARBEPOETIN ALFA 100 MCG/0.5ML IJ SOSY
100.0000 ug | PREFILLED_SYRINGE | INTRAMUSCULAR | Status: DC
  Administered 2015-12-05: 100 ug via INTRAVENOUS

## 2015-12-05 MED ORDER — DOCUSATE SODIUM 283 MG RE ENEM
1.0000 | ENEMA | RECTAL | Status: DC | PRN
  Filled 2015-12-05: qty 1

## 2015-12-05 MED ORDER — NEPRO/CARBSTEADY PO LIQD
237.0000 mL | Freq: Three times a day (TID) | ORAL | Status: DC | PRN
  Filled 2015-12-05: qty 237

## 2015-12-05 MED ORDER — STERILE WATER FOR INJECTION IJ SOLN
INTRAMUSCULAR | Status: AC
  Filled 2015-12-05: qty 10

## 2015-12-05 MED ORDER — ACETAMINOPHEN 325 MG PO TABS
650.0000 mg | ORAL_TABLET | Freq: Four times a day (QID) | ORAL | Status: DC | PRN
  Administered 2015-12-05: 650 mg via ORAL

## 2015-12-05 MED ORDER — SODIUM CHLORIDE 0.9 % IV SOLN: 10.0000 mL/h | Freq: Once | INTRAVENOUS | Status: DC

## 2015-12-05 MED ORDER — DEXTROSE 50 % IV SOLN
INTRAVENOUS | Status: AC
  Filled 2015-12-05: qty 50

## 2015-12-05 NOTE — ED Notes (Signed)
Notified MD ([place]) of critical glucose of 41

## 2015-12-05 NOTE — Procedures (Signed)
Pt seen on HD goal 4.5 - pre HD wt 79.3 - large IDWG - LE edema not new. Sats ok BP 130s hgb 7.6 (was 8 with 28% sat 7/29) due to resume ESA next week -Heme + stool 7/17 has recent EGD/colonsocopy with bx showing reactive gastropathy and tubular adenoma.  Deosn't look like she has had any ESA since June at her clinic or while hospitalized so drop in Hgb is in part due to ESA deficit. Will dose ESA today Aranesp 100.  Hold off on transfusion.  K 4 - use 3 K bath today. Lower volume. Need to continue to titrate down as BP allows. Large IDWG make this difficult.  Kelly Splinter MD [employer] pager [phone]    cell [phone] 12/05/2015, 12:58 PM

## 2015-12-05 NOTE — ED Notes (Signed)
CBG: 80 

## 2015-12-05 NOTE — ED Notes (Signed)
Pt yelling, cursing, and trying to get out of bed.

## 2015-12-05 NOTE — ED Notes (Signed)
Pt spoke with telepsych

## 2015-12-05 NOTE — ED Notes (Signed)
CBG 60; RN notified; pt offered graham crackers, peanut butter, and orange juice; pt had a few sips of orange juice and 1/2 graham cracker with pb; pt refused to eat more than that at this time; will continue to monitor and offer food and drink

## 2015-12-05 NOTE — ED Notes (Signed)
Security, ED staff and family at bedside, pt attempting to get out of bed. Verbally aggressive at this time, attempting to redirect.

## 2015-12-05 NOTE — ED Notes (Signed)
Iv team at bedside  

## 2015-12-05 NOTE — ED Notes (Signed)
Pt repeatedly attempting to leave and being uncooperative with staff.

## 2015-12-05 NOTE — ED Notes (Signed)
Patient yelling, cursing and trying to get out of bed, screaming at tech, RN and others going by.  Patient is paranoid.

## 2015-12-05 NOTE — ED Notes (Signed)
Geodon given to help calm pt down pt took shot voluntary , Air cabin crew remains at bedside
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
## 2015-12-05 NOTE — ED Notes (Signed)
Pt remains agitated and uncooperative , cbg is up to 79, safety sitter at the bedside

## 2015-12-05 NOTE — ED Provider Notes (Signed)
Pt did receive dialysis today.  Dr. Jonnie Finner saw her while there and thought her anemia was due to ESA deficit and held off on transfusion as her hemoglobins have been stable.  The pt was given aranesp 100 in dialysis.  The pt was d/w TTS.  The pt does not meet criteria for an inpatient admission.  They recommend increasing geodon to 60 mg bid.  I spoke with the hospitalists and they don't feel like an admission would be beneficial for pt.   Isla Pence, MD 12/06/15 [phone]
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
## 2015-12-05 NOTE — ED Notes (Signed)
Meal tray at bedside.  

## 2015-12-05 NOTE — ED Notes (Signed)
CBG: 85. RN notified. 

## 2015-12-05 NOTE — ED Notes (Signed)
Dinner tray ordered, renal diet, no sharps

## 2015-12-05 NOTE — ED Notes (Signed)
Pt back from dialysis , tts done, safety sitter at bedside

## 2015-12-05 NOTE — ED Notes (Signed)
Restraints released , pt calm and cooperative , pt transported  To dialysis, report given , pt to return after dialysis for tts

## 2015-12-05 NOTE — ED Notes (Signed)
Pt beginning to calm down, security remaining at bedside with staff to redirect.

## 2015-12-05 NOTE — ED Notes (Signed)
CBG 79 

## 2015-12-05 NOTE — ED Notes (Signed)
Attempted to call daughter to take patient home, no answer, VM left to call back main ED line.

## 2015-12-05 NOTE — ED Provider Notes (Signed)
Patient awaiting psychiatric evaluation. She is due for dialysis today, nephrology will be consulted. Noted to have hypoglycemia on screen blood work which has improved after she ate. She is not diabetic. Hemoglobin is 7.6 which is similar to previous values during her previous hospitalization. Patient recently had EGD and colonoscopy with polyp removal and findings of esophagitis and gastritis.  D/w Dr. Jonnie Finner who will evaluate for dialysis. Unknown psych disposition at this time.  Dr. Jonnie Finner agrees with 1 unit pRBCs to be given at dialysis  Awaiting psychiatry re-assessment.   Ezequiel Essex, MD 12/05/15 1530

## 2015-12-05 NOTE — ED Notes (Signed)
Pt ate one bite of apple pie and half of a graham cracker

## 2015-12-05 NOTE — ED Notes (Signed)
Pt requested to have food rewarmed. Food was heated up. Pt ate one bite of green beans and refused to eat anymore.
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
## 2015-12-06 LAB — URINE CULTURE: Culture: <10000 — AB

## 2015-12-06 NOTE — ED Notes (Signed)
Contacted daughter. Does not live in [place]. Stated she could get her mother in the morning

## 2015-12-06 NOTE — ED Notes (Signed)
Pt refused to remove gown to put her shirt on. Pt was taken out in a wheelchair to meet her daughter fully covered in a hospital gown and pants with her cell phone and discharge papers. Pt placed in daughters car by this RN.

## 2015-12-06 NOTE — ED Notes (Signed)
Pt got out of bed and would not cooperate with staff. Pt refused to get back in bed. Bed was brought behind pt where she could then sit down.

## 2015-12-06 NOTE — ED Notes (Signed)
Daughter Katrina called and in route to pick up pt to take her home.

## 2015-12-09 LAB — TYPE AND SCREEN
ABO/RH(D): A POS
Antibody Screen: NEGATIVE
Unit division: 0
Unit division: 0

## 2015-12-09 NOTE — Anesthesia Postprocedure Evaluation (Signed)
Anesthesia Post Note  Patient: CRISTIANNA DIETEL  Procedure(s) Performed: Procedure(s) (LRB): ESOPHAGOGASTRODUODENOSCOPY (EGD) (N/A) COLONOSCOPY (N/A)  Patient location during evaluation: PACU Anesthesia Type: MAC Level of consciousness: awake and alert Pain management: pain level controlled Vital Signs Assessment: post-procedure vital signs reviewed and stable Respiratory status: spontaneous breathing, nonlabored ventilation, respiratory function stable and patient connected to nasal cannula oxygen Cardiovascular status: stable and blood pressure returned to baseline Anesthetic complications: no    Last Vitals:  Vitals:   11/30/15 2024 12/01/15 0618  BP: 120/68 138/72  Pulse: 95 95  Resp: 20 20  Temp: 37.1 C 36.9 C    Last Pain:  Vitals:   12/01/15 0852  TempSrc:   PainSc: 0-No pain                 Quindarius Cabello,JAMES TERRILL

## 2015-12-14 ENCOUNTER — Observation Stay (HOSPITAL_COMMUNITY)
Admission: EM | Admit: 2015-12-14 | Discharge: 2015-12-18 | Disposition: A | Discharge status: Home / Self Care | Payer: Medicare Other | Attending: Internal Medicine | Admitting: Internal Medicine

## 2015-12-14 ENCOUNTER — Encounter (HOSPITAL_COMMUNITY): Payer: Self-pay

## 2015-12-14 DIAGNOSIS — F312 Bipolar disorder, current episode manic severe with psychotic features: Secondary | ICD-10-CM | POA: Diagnosis present

## 2015-12-14 DIAGNOSIS — F23 Brief psychotic disorder: Secondary | ICD-10-CM | POA: Diagnosis not present

## 2015-12-14 DIAGNOSIS — I120 Hypertensive chronic kidney disease with stage 5 chronic kidney disease or end stage renal disease: Secondary | ICD-10-CM | POA: Insufficient documentation

## 2015-12-14 DIAGNOSIS — Z96651 Presence of right artificial knee joint: Secondary | ICD-10-CM | POA: Diagnosis not present

## 2015-12-14 DIAGNOSIS — R4182 Altered mental status, unspecified: Secondary | ICD-10-CM | POA: Diagnosis present

## 2015-12-14 DIAGNOSIS — N186 End stage renal disease: Secondary | ICD-10-CM | POA: Insufficient documentation

## 2015-12-14 DIAGNOSIS — Z992 Dependence on renal dialysis: Secondary | ICD-10-CM | POA: Insufficient documentation

## 2015-12-14 DIAGNOSIS — D631 Anemia in chronic kidney disease: Secondary | ICD-10-CM | POA: Diagnosis present

## 2015-12-14 DIAGNOSIS — E875 Hyperkalemia: Secondary | ICD-10-CM | POA: Diagnosis not present

## 2015-12-14 DIAGNOSIS — N189 Chronic kidney disease, unspecified: Secondary | ICD-10-CM

## 2015-12-14 DIAGNOSIS — F17210 Nicotine dependence, cigarettes, uncomplicated: Secondary | ICD-10-CM | POA: Insufficient documentation

## 2015-12-14 DIAGNOSIS — F29 Unspecified psychosis not due to a substance or known physiological condition: Secondary | ICD-10-CM | POA: Diagnosis not present

## 2015-12-14 DIAGNOSIS — F17200 Nicotine dependence, unspecified, uncomplicated: Secondary | ICD-10-CM | POA: Diagnosis present

## 2015-12-14 DIAGNOSIS — E119 Type 2 diabetes mellitus without complications: Secondary | ICD-10-CM | POA: Diagnosis not present

## 2015-12-14 DIAGNOSIS — E785 Hyperlipidemia, unspecified: Secondary | ICD-10-CM | POA: Diagnosis present

## 2015-12-14 DIAGNOSIS — J449 Chronic obstructive pulmonary disease, unspecified: Secondary | ICD-10-CM | POA: Insufficient documentation

## 2015-12-14 DIAGNOSIS — E210 Primary hyperparathyroidism: Secondary | ICD-10-CM | POA: Diagnosis present

## 2015-12-14 HISTORY — DX: Bipolar disorder, unspecified: F31.9

## 2015-12-14 LAB — BASIC METABOLIC PANEL
Anion gap: 7 (ref 5–15)
BUN: 45 mg/dL — ABNORMAL HIGH (ref 6–20)
CO2: 24 mmol/L (ref 22–32)
Calcium: 8.7 mg/dL — ABNORMAL LOW (ref 8.9–10.3)
Chloride: 108 mmol/L (ref 101–111)
Creatinine, Ser: 7.36 mg/dL — ABNORMAL HIGH (ref 0.44–1.00)
GFR calc Af Amer: 6 mL/min — ABNORMAL LOW (ref >60)
GFR calc non Af Amer: 6 mL/min — ABNORMAL LOW (ref >60)
Glucose, Bld: 88 mg/dL (ref 65–99)
Potassium: 5.8 mmol/L — ABNORMAL HIGH (ref 3.5–5.1)
Sodium: 139 mmol/L (ref 135–145)

## 2015-12-14 LAB — I-STAT CHEM 8, ED
BUN: 44 mg/dL — ABNORMAL HIGH (ref 6–20)
Calcium, Ion: 1.11 mmol/L — ABNORMAL LOW (ref 1.13–1.30)
Chloride: 108 mmol/L (ref 101–111)
Creatinine, Ser: 6.9 mg/dL — ABNORMAL HIGH (ref 0.44–1.00)
Glucose, Bld: 83 mg/dL (ref 65–99)
HCT: 27 % — ABNORMAL LOW (ref 36.0–46.0)
Hemoglobin: 9.2 g/dL — ABNORMAL LOW (ref 12.0–15.0)
Potassium: 5.8 mmol/L — ABNORMAL HIGH (ref 3.5–5.1)
Sodium: 139 mmol/L (ref 135–145)
TCO2: 23 mmol/L (ref 0–100)

## 2015-12-14 LAB — CBC WITH DIFFERENTIAL/PLATELET
Basophils Absolute: 0 10*3/uL (ref 0.0–0.1)
Basophils Relative: 0 %
Eosinophils Absolute: 0.2 10*3/uL (ref 0.0–0.7)
Eosinophils Relative: 4 %
HCT: 27.6 % — ABNORMAL LOW (ref 36.0–46.0)
Hemoglobin: 8.1 g/dL — ABNORMAL LOW (ref 12.0–15.0)
Lymphocytes Relative: 19 %
Lymphs Abs: 0.9 10*3/uL (ref 0.7–4.0)
MCH: 29.6 pg (ref 26.0–34.0)
MCHC: 29.3 g/dL — ABNORMAL LOW (ref 30.0–36.0)
MCV: 100.7 fL — ABNORMAL HIGH (ref 78.0–100.0)
Monocytes Absolute: 0.4 10*3/uL (ref 0.1–1.0)
Monocytes Relative: 8 %
Neutro Abs: 3.3 10*3/uL (ref 1.7–7.7)
Neutrophils Relative %: 69 %
Platelets: 243 10*3/uL (ref 150–400)
RBC: 2.74 MIL/uL — ABNORMAL LOW (ref 3.87–5.11)
RDW: 21 % — ABNORMAL HIGH (ref 11.5–15.5)
WBC: 4.8 10*3/uL (ref 4.0–10.5)

## 2015-12-14 LAB — SALICYLATE LEVEL: Salicylate Lvl: <4 mg/dL (ref 2.8–30.0)

## 2015-12-14 LAB — ACETAMINOPHEN LEVEL: Acetaminophen (Tylenol), Serum: <10 ug/mL — ABNORMAL LOW (ref 10–30)

## 2015-12-14 LAB — ETHANOL: Alcohol, Ethyl (B): <5 mg/dL (ref <5)

## 2015-12-14 MED ORDER — ALBUTEROL SULFATE HFA 108 (90 BASE) MCG/ACT IN AERS: 2.0000 | INHALATION_SPRAY | Freq: Four times a day (QID) | RESPIRATORY_TRACT | Status: DC | PRN

## 2015-12-14 MED ORDER — MAGNESIUM CITRATE PO SOLN: 1.0000 | Freq: Once | ORAL | Status: DC | PRN

## 2015-12-14 MED ORDER — SIMVASTATIN 20 MG PO TABS
10.0000 mg | ORAL_TABLET | Freq: Every day | ORAL | Status: DC
  Administered 2015-12-14 – 2015-12-17 (×4): 10 mg via ORAL
  Filled 2015-12-14 (×4): qty 1

## 2015-12-14 MED ORDER — CINACALCET HCL 30 MG PO TABS: 60.0000 mg | ORAL_TABLET | Freq: Every day | ORAL | Status: DC

## 2015-12-14 MED ORDER — METOPROLOL TARTRATE 12.5 MG HALF TABLET
12.5000 mg | ORAL_TABLET | Freq: Two times a day (BID) | ORAL | Status: DC
  Administered 2015-12-14 – 2015-12-17 (×6): 12.5 mg via ORAL
  Filled 2015-12-14 (×8): qty 1

## 2015-12-14 MED ORDER — LORAZEPAM 0.5 MG PO TABS
0.5000 mg | ORAL_TABLET | Freq: Every day | ORAL | Status: DC
  Administered 2015-12-14: 0.5 mg via ORAL
  Filled 2015-12-14: qty 1

## 2015-12-14 MED ORDER — SENNOSIDES-DOCUSATE SODIUM 8.6-50 MG PO TABS: 1.0000 | ORAL_TABLET | Freq: Every evening | ORAL | Status: DC | PRN

## 2015-12-14 MED ORDER — SEVELAMER CARBONATE 800 MG PO TABS
1600.0000 mg | ORAL_TABLET | Freq: Three times a day (TID) | ORAL | Status: DC
  Administered 2015-12-15 – 2015-12-18 (×6): 1600 mg via ORAL
  Filled 2015-12-14 (×9): qty 2

## 2015-12-14 MED ORDER — ZIPRASIDONE MESYLATE 20 MG IM SOLR
10.0000 mg | Freq: Once | INTRAMUSCULAR | Status: AC
  Administered 2015-12-14: 10 mg via INTRAMUSCULAR
  Filled 2015-12-14: qty 20

## 2015-12-14 MED ORDER — DOXEPIN HCL 25 MG PO CAPS
25.0000 mg | ORAL_CAPSULE | Freq: Every day | ORAL | Status: DC
  Administered 2015-12-14: 25 mg via ORAL
  Filled 2015-12-14: qty 1

## 2015-12-14 MED ORDER — MOMETASONE FURO-FORMOTEROL FUM 100-5 MCG/ACT IN AERO
2.0000 | INHALATION_SPRAY | Freq: Two times a day (BID) | RESPIRATORY_TRACT | Status: DC
  Administered 2015-12-15 – 2015-12-18 (×5): 2 via RESPIRATORY_TRACT
  Filled 2015-12-14: qty 8.8

## 2015-12-14 MED ORDER — RENA-VITE PO TABS
1.0000 | ORAL_TABLET | Freq: Every day | ORAL | Status: DC
  Administered 2015-12-14 – 2015-12-17 (×4): 1 via ORAL
  Filled 2015-12-14 (×4): qty 1

## 2015-12-14 MED ORDER — CINACALCET HCL 30 MG PO TABS
60.0000 mg | ORAL_TABLET | Freq: Every day | ORAL | Status: DC
  Administered 2015-12-16 – 2015-12-18 (×3): 60 mg via ORAL
  Filled 2015-12-14 (×3): qty 2

## 2015-12-14 MED ORDER — PANTOPRAZOLE SODIUM 40 MG PO TBEC
40.0000 mg | DELAYED_RELEASE_TABLET | Freq: Two times a day (BID) | ORAL | Status: DC
  Administered 2015-12-14 – 2015-12-18 (×7): 40 mg via ORAL
  Filled 2015-12-14 (×8): qty 1

## 2015-12-14 MED ORDER — ZIPRASIDONE HCL 20 MG PO CAPS
20.0000 mg | ORAL_CAPSULE | Freq: Once | ORAL | Status: DC
Start: 2015-12-14 — End: 2015-12-14

## 2015-12-14 MED ORDER — ZIPRASIDONE HCL 60 MG PO CAPS
60.0000 mg | ORAL_CAPSULE | Freq: Two times a day (BID) | ORAL | Status: DC
Start: 2015-12-14 — End: 2015-12-18
  Administered 2015-12-14 – 2015-12-18 (×7): 60 mg via ORAL
  Filled 2015-12-14 (×9): qty 1

## 2015-12-14 MED ORDER — ONDANSETRON HCL 4 MG/2ML IJ SOLN: 4.0000 mg | Freq: Four times a day (QID) | INTRAMUSCULAR | Status: DC | PRN

## 2015-12-14 MED ORDER — BISACODYL 10 MG RE SUPP: 10.0000 mg | Freq: Every day | RECTAL | Status: DC | PRN

## 2015-12-14 MED ORDER — DOCUSATE SODIUM 100 MG PO CAPS
100.0000 mg | ORAL_CAPSULE | Freq: Two times a day (BID) | ORAL | Status: DC
  Administered 2015-12-14 – 2015-12-18 (×7): 100 mg via ORAL
  Filled 2015-12-14 (×8): qty 1

## 2015-12-14 MED ORDER — ALBUTEROL SULFATE (2.5 MG/3ML) 0.083% IN NEBU
2.5000 mg | INHALATION_SOLUTION | Freq: Four times a day (QID) | RESPIRATORY_TRACT | Status: DC | PRN
  Administered 2015-12-15 – 2015-12-16 (×2): 2.5 mg via RESPIRATORY_TRACT
  Filled 2015-12-14 (×2): qty 3

## 2015-12-14 MED ORDER — SODIUM POLYSTYRENE SULFONATE 15 GM/60ML PO SUSP
30.0000 g | Freq: Once | ORAL | Status: AC
  Administered 2015-12-14: 30 g via ORAL
  Filled 2015-12-14: qty 120

## 2015-12-14 MED ORDER — SODIUM CHLORIDE 0.9% FLUSH
3.0000 mL | Freq: Two times a day (BID) | INTRAVENOUS | Status: DC
  Administered 2015-12-18: 3 mL via INTRAVENOUS

## 2015-12-14 MED ORDER — ONDANSETRON HCL 4 MG PO TABS
4.0000 mg | ORAL_TABLET | Freq: Four times a day (QID) | ORAL | Status: DC | PRN
  Administered 2015-12-15: 4 mg via ORAL
  Filled 2015-12-14: qty 1

## 2015-12-14 MED ORDER — STERILE WATER FOR INJECTION IJ SOLN
INTRAMUSCULAR | Status: AC
  Administered 2015-12-14: 10 mL
  Filled 2015-12-14: qty 10

## 2015-12-14 MED ORDER — DIVALPROEX SODIUM ER 500 MG PO TB24
500.0000 mg | ORAL_TABLET | Freq: Two times a day (BID) | ORAL | Status: DC
  Administered 2015-12-14 – 2015-12-15 (×2): 500 mg via ORAL
  Filled 2015-12-14 (×2): qty 1
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
## 2015-12-14 NOTE — ED Notes (Signed)
Security to escort staff upstairs with the pt.
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
## 2015-12-14 NOTE — ED Notes (Signed)
Dr. Belfi at bedside 

## 2015-12-14 NOTE — Progress Notes (Signed)
Patient arrived on unit from ED via stretcher.  No family at bedside.  Telemetry placed per MD order and central telemetry notified.

## 2015-12-14 NOTE — ED Notes (Signed)
Myself, [place], RN and [place], EMT assisted Loma Sousa, RN with giving patient her medicine; patient tolerated well and now has returned to eating her [place] sandwich

## 2015-12-14 NOTE — ED Notes (Signed)
Attempted to call report
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
## 2015-12-14 NOTE — ED Notes (Addendum)
Pt requested to have another gown on so that her back could be covered. The pt began yelling "doctor!" "doctor!" this RN repeatedly asked that the pt stop yelling and told the pt that the doctor would be in to see her when the doctor was ready and available, pt continued to yell.  While this RN was assisting the pt to put on her extra gown the pt balled up her fist and demanded that this RN "fix my gown! I'm angry fix it!", this RN asked that the pt please stop.

## 2015-12-14 NOTE — ED Triage Notes (Signed)
Per [facility]: Pts daughter called today because the pt has missed "2 to 3 dialysis appointments", pts daughter also states that the pt took "an unknown amount of her daily medications". Pt only allowed EMS to take one set of vitals, pt will not answer orientation questions, will follow commands for her daughter, but will not answer questions for EMS. Pt has bilateral pitting edema in her legs. Pt had stopped taking her medications for her bipolar, and when she stops taking her medication she stops going to dialysis.

## 2015-12-14 NOTE — Progress Notes (Signed)
Patient admitted under Observation status.  Will arrange for dialysis tomorrow, give Kayexalate 30 gm x 1.  If patient status changes to Inpatient status please notify us and we will do a formal consultation.    TTS [place]   4h  73.5kg  2/2 bath  Hep none  RUA AVG Hect 6 ug, Mircera 225 last 8/1 , venofer 100 x 10  Kelly Splinter MD [facility] pager [phone]    cell [phone] 11/14/2015, 11:45 AM

## 2015-12-14 NOTE — ED Notes (Signed)
Patient given a [place] sandwich and drink and also asking for pain medicine and to speak to the doctor "about what happened the last time I was here and got discharged when I went home"; Loma Sousa, RN notified

## 2015-12-15 DIAGNOSIS — D631 Anemia in chronic kidney disease: Secondary | ICD-10-CM | POA: Diagnosis not present

## 2015-12-15 DIAGNOSIS — Z992 Dependence on renal dialysis: Secondary | ICD-10-CM | POA: Diagnosis not present

## 2015-12-15 DIAGNOSIS — F312 Bipolar disorder, current episode manic severe with psychotic features: Secondary | ICD-10-CM | POA: Diagnosis not present

## 2015-12-15 DIAGNOSIS — F29 Unspecified psychosis not due to a substance or known physiological condition: Secondary | ICD-10-CM

## 2015-12-15 DIAGNOSIS — N186 End stage renal disease: Secondary | ICD-10-CM

## 2015-12-15 DIAGNOSIS — I10 Essential (primary) hypertension: Secondary | ICD-10-CM | POA: Diagnosis not present

## 2015-12-15 DIAGNOSIS — F23 Brief psychotic disorder: Secondary | ICD-10-CM | POA: Diagnosis not present

## 2015-12-15 LAB — COMPREHENSIVE METABOLIC PANEL
ALT: 10 U/L — ABNORMAL LOW (ref 14–54)
AST: 11 U/L — ABNORMAL LOW (ref 15–41)
Albumin: 3 g/dL — ABNORMAL LOW (ref 3.5–5.0)
Alkaline Phosphatase: 102 U/L (ref 38–126)
Anion gap: 10 (ref 5–15)
BUN: 47 mg/dL — ABNORMAL HIGH (ref 6–20)
CO2: 23 mmol/L (ref 22–32)
Calcium: 8.8 mg/dL — ABNORMAL LOW (ref 8.9–10.3)
Chloride: 107 mmol/L (ref 101–111)
Creatinine, Ser: 7.38 mg/dL — ABNORMAL HIGH (ref 0.44–1.00)
GFR calc Af Amer: 6 mL/min — ABNORMAL LOW (ref >60)
GFR calc non Af Amer: 6 mL/min — ABNORMAL LOW (ref >60)
Glucose, Bld: 75 mg/dL (ref 65–99)
Potassium: 6 mmol/L — ABNORMAL HIGH (ref 3.5–5.1)
Sodium: 140 mmol/L (ref 135–145)
Total Bilirubin: 0.4 mg/dL (ref 0.3–1.2)
Total Protein: 6.2 g/dL — ABNORMAL LOW (ref 6.5–8.1)

## 2015-12-15 LAB — CBC
HCT: 30.1 % — ABNORMAL LOW (ref 36.0–46.0)
Hemoglobin: 8.7 g/dL — ABNORMAL LOW (ref 12.0–15.0)
MCH: 29.4 pg (ref 26.0–34.0)
MCHC: 28.9 g/dL — ABNORMAL LOW (ref 30.0–36.0)
MCV: 101.7 fL — ABNORMAL HIGH (ref 78.0–100.0)
Platelets: 257 10*3/uL (ref 150–400)
RBC: 2.96 MIL/uL — ABNORMAL LOW (ref 3.87–5.11)
RDW: 21.2 % — ABNORMAL HIGH (ref 11.5–15.5)
WBC: 3.5 10*3/uL — ABNORMAL LOW (ref 4.0–10.5)

## 2015-12-15 MED ORDER — SODIUM CHLORIDE 0.9 % IV SOLN: 100.0000 mL | INTRAVENOUS | Status: DC | PRN

## 2015-12-15 MED ORDER — ALTEPLASE 2 MG IJ SOLR: 2.0000 mg | Freq: Once | INTRAMUSCULAR | Status: DC | PRN

## 2015-12-15 MED ORDER — ZIPRASIDONE MESYLATE 20 MG IM SOLR
20.0000 mg | Freq: Once | INTRAMUSCULAR | Status: AC
Start: 2015-12-15 — End: 2015-12-15
  Administered 2015-12-15: 20 mg via INTRAMUSCULAR
  Filled 2015-12-15: qty 20

## 2015-12-15 MED ORDER — IBUPROFEN 200 MG PO TABS
200.0000 mg | ORAL_TABLET | Freq: Four times a day (QID) | ORAL | Status: DC | PRN
  Filled 2015-12-15 (×3): qty 1

## 2015-12-15 MED ORDER — PENTAFLUOROPROP-TETRAFLUOROETH EX AERO: 1.0000 "application " | INHALATION_SPRAY | CUTANEOUS | Status: DC | PRN

## 2015-12-15 MED ORDER — LIDOCAINE-PRILOCAINE 2.5-2.5 % EX CREA: 1.0000 "application " | TOPICAL_CREAM | CUTANEOUS | Status: DC | PRN

## 2015-12-15 MED ORDER — LIDOCAINE HCL (PF) 1 % IJ SOLN: 5.0000 mL | INTRAMUSCULAR | Status: DC | PRN

## 2015-12-15 MED ORDER — DIVALPROEX SODIUM ER 250 MG PO TB24
750.0000 mg | ORAL_TABLET | Freq: Two times a day (BID) | ORAL | Status: DC
  Administered 2015-12-15 – 2015-12-18 (×6): 750 mg via ORAL
  Filled 2015-12-15 (×6): qty 1

## 2015-12-15 MED ORDER — NICOTINE 21 MG/24HR TD PT24
21.0000 mg | MEDICATED_PATCH | Freq: Every day | TRANSDERMAL | Status: DC
  Administered 2015-12-15 – 2015-12-18 (×4): 21 mg via TRANSDERMAL
  Filled 2015-12-15 (×4): qty 1

## 2015-12-15 MED ORDER — DOXEPIN HCL 50 MG PO CAPS
50.0000 mg | ORAL_CAPSULE | Freq: Every day | ORAL | Status: DC
  Administered 2015-12-15 – 2015-12-17 (×3): 50 mg via ORAL
  Filled 2015-12-15 (×4): qty 1

## 2015-12-15 MED ORDER — HEPARIN SODIUM (PORCINE) 1000 UNIT/ML DIALYSIS: 1000.0000 [IU] | INTRAMUSCULAR | Status: DC | PRN

## 2015-12-15 MED ORDER — LORAZEPAM 1 MG PO TABS
1.0000 mg | ORAL_TABLET | Freq: Every day | ORAL | Status: DC
  Administered 2015-12-15 – 2015-12-17 (×3): 1 mg via ORAL
  Filled 2015-12-15 (×3): qty 1

## 2015-12-15 NOTE — Progress Notes (Addendum)
Patient has chronic mental illness and recurrent behavior issues at OP dialysis, she is not doing well on dialysis and has had these problems for the past year since she started.  I have discussed with patient and daughter that dialysis may not be suitable for Ms Denisco.  Will ask psychiatry to see to see if there is another medication that can be used to improve her compliance and behavior.    Kelly Splinter MD [employer] pager [phone]    cell [phone] 12/15/2015, 12:51 PM

## 2015-12-15 NOTE — Progress Notes (Addendum)
PROGRESS NOTE                                                                                                                                                                                                             Patient Demographics:    Jennifer Lawson, is a 55 y.o. female, DOB - May 18, 1960, [id number]  Admit date - 12/14/2015   Admitting Physician Truett Mainland, DO  Outpatient Primary MD for the patient is [facility]  LOS - 0  Chief Complaint  Patient presents with  . Other    needs dialysis       Brief Narrative    55 y.o. 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   Subjective:    Jennifer Lawson today Today is sleepy, confused.  Assessment  & Plan :    Active Problems:   HTN (hypertension)   Tobacco use disorder   Hyperparathyroidism, primary ([place])   COPD (chronic obstructive pulmonary disease) (HCC)   Anemia of renal disease   Bipolar affective disorder, current episode manic with psychotic symptoms ([place])   Dyslipidemia   Psychosis  Acute Psychosis, h/o Bipolar disorder ,  - with aggressive behavior upon arrival, requiring sedatives.  - Continue Geodon and Depakote - Awaiting Psych eval   ESRD  - Hemodialysis TTS, missed last 2 sessions . - Renal consulted, started on hemodialysis    Hypertension  - Continue home medication  Hyperlipidemia - Continue home statins  Anemia of chronic disease  - On Aranesp by Renal. Recent GIB due to Grade C esophagitis and Gastric ulcer due to NSAIDs   GERD,  - no acute symptoms: Continue PPI  COPD without exacerbation - Continue to monitor, nebs prn if symptomatic      Code Status : Full  Family Communication   : None at bedside  Disposition Plan  : pending further work up.  Consults  :  renal  Procedures  : None  DVT Prophylaxis  :   SCDs  Lab Results  Component Value Date   PLT 257 12/15/2015    Antibiotics  :   Anti-infectives  None        Objective:   Vitals:   12/15/15 0930 12/15/15 0959 12/15/15 1029 12/15/15 1043  BP: 105/70 100/65 111/68 114/73  Pulse: 87 86 85 85  Resp: 18 19 18 18   Temp:    98 F (36.7 C)  TempSrc:    Oral  SpO2:    94%  Weight:    75.9 kg (167 lb 5.3 oz)  Height:        Wt Readings from Last 3 Encounters:  12/15/15 75.9 kg (167 lb 5.3 oz)  12/05/15 75.2 kg (165 lb 12.6 oz)  11/30/15 80.5 kg (177 lb 6.4 oz)     Intake/Output Summary (Last 24 hours) at 12/15/15 1157 Last data filed at 12/15/15 1043  Gross per 24 hour  Intake              240 ml  Output             2500 ml  Net            -2260 ml     Physical Exam Sleepy, confused  Supple Neck,No JVD,  Symmetrical Chest wall movement, Good air movement bilaterally,  RRR,No Gallops,Rubs or new Murmurs, No Parasternal Heave +ve B.Sounds, Abd Soft, No tenderness,  No Cyanosis, Clubbing , +1 edema    Data Review:    CBC  Recent Labs Lab 12/14/15 1350 12/14/15 1406 12/15/15 0526  WBC 4.8  --  3.5*  HGB 8.1* 9.2* 8.7*  HCT 27.6* 27.0* 30.1*  PLT 243  --  257  MCV 100.7*  --  101.7*  MCH 29.6  --  29.4  MCHC 29.3*  --  28.9*  RDW 21.0*  --  21.2*  LYMPHSABS 0.9  --   --   MONOABS 0.4  --   --   EOSABS 0.2  --   --   BASOSABS 0.0  --   --     Chemistries   Recent Labs Lab 12/14/15 1350 12/14/15 1406 12/15/15 0526  NA 139 139 140  K 5.8* 5.8* 6.0*  CL 108 108 107  CO2 24  --  23  GLUCOSE 88 83 75  BUN 45* 44* 47*  CREATININE 7.36* 6.90* 7.38*  CALCIUM 8.7*  --  8.8*  AST  --   --  11*  ALT  --   --  10*  ALKPHOS  --   --  102  BILITOT  --   --  0.4    ------------------------------------------------------------------------------------------------------------------ No results for input(s): CHOL, HDL, LDLCALC, TRIG, CHOLHDL, LDLDIRECT in the last 72 hours.  Lab Results  Component Value Date   HGBA1C 4.3 06/03/2015   ------------------------------------------------------------------------------------------------------------------ No results for input(s): TSH, T4TOTAL, T3FREE, THYROIDAB in the last 72 hours.  Invalid input(s): FREET3 ------------------------------------------------------------------------------------------------------------------ No results for input(s): VITAMINB12, FOLATE, FERRITIN, TIBC, IRON, RETICCTPCT in the last 72 hours.  Coagulation profile No results for input(s): INR, PROTIME in the last 168 hours.  No results for input(s): DDIMER in the last 72 hours.  Cardiac Enzymes No results for input(s): CKMB, TROPONINI, MYOGLOBIN in the last 168 hours.  Invalid input(s): CK ------------------------------------------------------------------------------------------------------------------    Component Value Date/Time   BNP 155.4 (H) 12/05/2015 0556    Inpatient Medications  Scheduled Meds: . cinacalcet  60 mg Oral Q breakfast  . divalproex  500 mg Oral BID  . docusate sodium  100 mg Oral BID  . doxepin  25 mg Oral QHS  . LORazepam  0.5 mg Oral  QHS  . metoprolol tartrate  12.5 mg Oral BID  . mometasone-formoterol  2 puff Inhalation BID  . multivitamin  1 tablet Oral QHS  . pantoprazole  40 mg Oral BID  . sevelamer carbonate  1,600 mg Oral TID WC  . simvastatin  10 mg Oral QHS  . sodium chloride flush  3 mL Intravenous Q12H  . ziprasidone  60 mg Oral BID WC   Continuous Infusions:  PRN Meds:.albuterol, bisacodyl, magnesium citrate, ondansetron **OR** ondansetron (ZOFRAN) IV, senna-docusate  Micro Results No results found for this or any previous visit (from the past 240 hour(s)).  Radiology  Reports Acute Abdominal Series  Result Date: 11/24/2015 CLINICAL DATA:  Nausea and vomiting.  Diarrhea. EXAM: DG ABDOMEN ACUTE W/ 1V CHEST COMPARISON:  August 06, 2015 FINDINGS: Mild atelectasis in the left lung base. Stable cardiomegaly. The hila and mediastinum are unchanged. No pneumothorax. No pulmonary nodules, masses, or suspicious infiltrates. No free air, portal venous gas, or pneumatosis. The bowel gas pattern is nonobstructive. No other acute abnormalities. IMPRESSION: Negative abdominal radiographs.  No acute cardiopulmonary disease. Electronically Signed   By: Dorise Bullion III M.D   On: 11/24/2015 03:59    [facility], Mikaylee Arseneau M.D on 12/15/2015 at 11:57 AM  Between 7am to 7pm - Pager - [phone]  After 7pm go to [email or web address] - password [facility]  [facility] -  Office  [phone]

## 2015-12-15 NOTE — Progress Notes (Signed)
   12/15/15 1600  Clinical Encounter Type  Visited With Patient  Visit Type Spiritual support  Referral From Chaplain  Consult/Referral To Chaplain  Spiritual Encounters  Spiritual Needs Emotional  Stress Factors  Patient Stress Factors Family relationships;Health changes  Chaplain visit made, patient is a bit anxious, verbalized some discomforts, provided active listening, validated needs. Chaplain  will be available for further support as needed.

## 2015-12-15 NOTE — Progress Notes (Signed)
Pt currently in Hemodialysis. Will check on pt later in day or next day Kari Baars, [place] [phone]
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
## 2015-12-15 NOTE — Procedures (Signed)
  I was present at this dialysis session, have reviewed the session itself and made  appropriate changes Kelly Splinter MD [place] pager [phone]    cell [phone] 12/15/2015, 11:17 AM

## 2015-12-16 DIAGNOSIS — I10 Essential (primary) hypertension: Secondary | ICD-10-CM | POA: Diagnosis not present

## 2015-12-16 DIAGNOSIS — F312 Bipolar disorder, current episode manic severe with psychotic features: Secondary | ICD-10-CM | POA: Diagnosis not present

## 2015-12-16 DIAGNOSIS — Z992 Dependence on renal dialysis: Secondary | ICD-10-CM | POA: Diagnosis not present

## 2015-12-16 DIAGNOSIS — F23 Brief psychotic disorder: Secondary | ICD-10-CM | POA: Diagnosis not present

## 2015-12-16 DIAGNOSIS — N186 End stage renal disease: Secondary | ICD-10-CM | POA: Diagnosis not present

## 2015-12-16 MED ORDER — WHITE PETROLATUM GEL
Status: AC
  Administered 2015-12-16: 0.2
  Filled 2015-12-16: qty 1

## 2015-12-16 MED ORDER — ALPRAZOLAM 0.25 MG PO TABS
0.2500 mg | ORAL_TABLET | Freq: Three times a day (TID) | ORAL | Status: DC | PRN
  Administered 2015-12-16 – 2015-12-18 (×4): 0.25 mg via ORAL
  Filled 2015-12-16 (×3): qty 1
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
## 2015-12-16 NOTE — Evaluation (Signed)
Occupational Therapy Evaluation Patient Details Name: KIMBERLIN PALA MRN: [id number] DOB: Oct 27, 1960 Today's Date: 12/16/2015    History of Present Illness Pt is 55 yr old female with bipolar disorder, end-stage renal disease on hemodialysis on Tuesdays, Thursdays, Saturdays. She was brought in by EMS due to increasing aggressive behavior.   Clinical Impression   Pt overall S with ADL activity.  Do not feel pt will make progress with OT as she is not very agreeable to activity .    Follow Up Recommendations  No OT follow up              Mobility Bed Mobility Overal bed mobility: Modified Independent                Transfers Overall transfer level: Needs assistance Equipment used: Rolling walker (2 wheeled) Transfers: Stand Pivot Transfers;Sit to/from Stand Sit to Stand: Supervision Stand pivot transfers: Supervision       General transfer comment: Supervision for safety         ADL Overall ADL's : Needs assistance/impaired                                       General ADL Comments: Pt overall S with ADL activity.  Pt wanted activites done for her. OT encouraged pt to perform activity.               Pertinent Vitals/Pain Pain Assessment: No/denies pain        Extremity/Trunk Assessment Upper Extremity Assessment Upper Extremity Assessment: Overall WFL for tasks assessed           Communication Communication Communication: No difficulties   Cognition Arousal/Alertness: Awake/alert Behavior During Therapy: Flat affect;Anxious;Impulsive                                  Home Living Family/patient expects to be discharged to:: Unsure Living Arrangements: Children Available Help at Discharge: Family;Available PRN/intermittently (Daughter works 2nd shift) Type of Home: House Home Access: Level entry     [place]: One level     Bathroom Shower/Tub: Tub/shower unit         Home Equipment: Environmental consultant - 4  wheels   Additional Comments: Pt is a questionable historian, majority of information taken from previous submissions.      Prior Functioning/Environment Level of Independence: Independent with assistive device(s)        Comments: Pt reported she uses a rollator at home for mobility.             OT Goals(Current goals can be found in the care plan section) Acute Rehab OT Goals Patient Stated Goal: None stated  OT Frequency:                End of Session Nurse Communication: Mobility status  Activity Tolerance: Patient tolerated treatment well Patient left: in chair;with call bell/phone within reach   Time: 1041-1055 OT Time Calculation (min): 14 min Charges:  OT General Charges $OT Visit: 1 Procedure OT Evaluation $OT Eval Moderate Complexity: 1 Procedure G-Codes: OT G-codes **NOT FOR INPATIENT CLASS** Functional Assessment Tool Used: clinical observation Functional Limitation: Self care Self Care Current Status [id number]): At least 1 percent but less than 20 percent impaired, limited or restricted Self Care Goal Status [id number]): At least 1 percent but less than 20 percent impaired, limited or  restricted Self Care Discharge Status [phone]): At least 1 percent but less than 20 percent impaired, limited or restricted  [place], Thereasa Parkin 12/16/2015, 11:34 AM

## 2015-12-16 NOTE — Clinical Social Work Psych Note (Signed)
Psych CSW received consult for inpatient psychiatric hospital placement.  Per medical staff, patient is medically stable and ready for psychiatric transfer.  Referrals have been made.    Barrier: HD  Nonnie Done, LCSW [phone]  [address] [place] Licensed Clinical Social Worker

## 2015-12-17 DIAGNOSIS — D631 Anemia in chronic kidney disease: Secondary | ICD-10-CM | POA: Diagnosis not present

## 2015-12-17 DIAGNOSIS — F312 Bipolar disorder, current episode manic severe with psychotic features: Secondary | ICD-10-CM | POA: Diagnosis not present

## 2015-12-17 DIAGNOSIS — J449 Chronic obstructive pulmonary disease, unspecified: Secondary | ICD-10-CM | POA: Diagnosis not present

## 2015-12-17 DIAGNOSIS — E785 Hyperlipidemia, unspecified: Secondary | ICD-10-CM | POA: Diagnosis not present

## 2015-12-17 DIAGNOSIS — E210 Primary hyperparathyroidism: Secondary | ICD-10-CM

## 2015-12-17 DIAGNOSIS — F17200 Nicotine dependence, unspecified, uncomplicated: Secondary | ICD-10-CM

## 2015-12-17 DIAGNOSIS — F23 Brief psychotic disorder: Secondary | ICD-10-CM | POA: Diagnosis not present

## 2015-12-17 LAB — RENAL FUNCTION PANEL
Albumin: 2.5 g/dL — ABNORMAL LOW (ref 3.5–5.0)
Anion gap: 10 (ref 5–15)
BUN: 34 mg/dL — ABNORMAL HIGH (ref 6–20)
CO2: 25 mmol/L (ref 22–32)
Calcium: 8.8 mg/dL — ABNORMAL LOW (ref 8.9–10.3)
Chloride: 103 mmol/L (ref 101–111)
Creatinine, Ser: 5.32 mg/dL — ABNORMAL HIGH (ref 0.44–1.00)
GFR calc Af Amer: 9 mL/min — ABNORMAL LOW (ref >60)
GFR calc non Af Amer: 8 mL/min — ABNORMAL LOW (ref >60)
Glucose, Bld: 79 mg/dL (ref 65–99)
Phosphorus: 4.3 mg/dL (ref 2.5–4.6)
Potassium: 5.5 mmol/L — ABNORMAL HIGH (ref 3.5–5.1)
Sodium: 138 mmol/L (ref 135–145)

## 2015-12-17 LAB — BASIC METABOLIC PANEL
Anion gap: 11 (ref 5–15)
BUN: 36 mg/dL — ABNORMAL HIGH (ref 6–20)
CO2: 23 mmol/L (ref 22–32)
Calcium: 8.9 mg/dL (ref 8.9–10.3)
Chloride: 104 mmol/L (ref 101–111)
Creatinine, Ser: 5.27 mg/dL — ABNORMAL HIGH (ref 0.44–1.00)
GFR calc Af Amer: 10 mL/min — ABNORMAL LOW (ref >60)
GFR calc non Af Amer: 8 mL/min — ABNORMAL LOW (ref >60)
Glucose, Bld: 79 mg/dL (ref 65–99)
Potassium: 5.8 mmol/L — ABNORMAL HIGH (ref 3.5–5.1)
Sodium: 138 mmol/L (ref 135–145)

## 2015-12-17 LAB — CBC
HCT: 26.8 % — ABNORMAL LOW (ref 36.0–46.0)
Hemoglobin: 7.8 g/dL — ABNORMAL LOW (ref 12.0–15.0)
MCH: 29.7 pg (ref 26.0–34.0)
MCHC: 29.1 g/dL — ABNORMAL LOW (ref 30.0–36.0)
MCV: 101.9 fL — ABNORMAL HIGH (ref 78.0–100.0)
Platelets: 239 10*3/uL (ref 150–400)
RBC: 2.63 MIL/uL — ABNORMAL LOW (ref 3.87–5.11)
RDW: 20.6 % — ABNORMAL HIGH (ref 11.5–15.5)
WBC: 3.5 10*3/uL — ABNORMAL LOW (ref 4.0–10.5)

## 2015-12-17 MED ORDER — ALTEPLASE 2 MG IJ SOLR: 2.0000 mg | Freq: Once | INTRAMUSCULAR | Status: DC | PRN

## 2015-12-17 MED ORDER — LIDOCAINE HCL (PF) 1 % IJ SOLN: 5.0000 mL | INTRAMUSCULAR | Status: DC | PRN

## 2015-12-17 MED ORDER — LIDOCAINE-PRILOCAINE 2.5-2.5 % EX CREA: 1.0000 "application " | TOPICAL_CREAM | CUTANEOUS | Status: DC | PRN

## 2015-12-17 MED ORDER — SODIUM CHLORIDE 0.9 % IV SOLN: 100.0000 mL | INTRAVENOUS | Status: DC | PRN

## 2015-12-17 MED ORDER — PENTAFLUOROPROP-TETRAFLUOROETH EX AERO: 1.0000 "application " | INHALATION_SPRAY | CUTANEOUS | Status: DC | PRN

## 2015-12-17 MED ORDER — ALPRAZOLAM 0.5 MG PO TABS
ORAL_TABLET | ORAL | Status: AC
  Filled 2015-12-17: qty 1

## 2015-12-17 MED ORDER — HEPARIN SODIUM (PORCINE) 1000 UNIT/ML DIALYSIS: 1000.0000 [IU] | INTRAMUSCULAR | Status: DC | PRN

## 2015-12-17 NOTE — Procedures (Signed)
Awaiting to see if pt will be accepted at [facility] in [facility] for psych Rx inpatient.  HD today, is up up 8 kg by standing wts, max UF w HD today.   TTS [place]   4h  73.5kg  2/2 bath  Hep none  RUA AVG Hect 6 ug, Mircera 225 last 8/1 , venofer 100 x 10  I was present at this dialysis session, have reviewed the session itself and made  appropriate changes Kelly Splinter MD [place] pager [phone]    cell [phone] 12/17/2015, 10:45 AM
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
## 2015-12-17 NOTE — Clinical Social Work Note (Signed)
Psych CSW was informed that [place] is at capacity today.  Patient will need HD at time of transfer which is a barrier to placement.  Medical Director and RNCM updated.  Nonnie Done, LCSW [phone]  [address] and [place] Licensed Clinical Social Worker

## 2015-12-18 DIAGNOSIS — D631 Anemia in chronic kidney disease: Secondary | ICD-10-CM | POA: Diagnosis not present

## 2015-12-18 DIAGNOSIS — F312 Bipolar disorder, current episode manic severe with psychotic features: Secondary | ICD-10-CM | POA: Diagnosis not present

## 2015-12-18 DIAGNOSIS — J449 Chronic obstructive pulmonary disease, unspecified: Secondary | ICD-10-CM | POA: Diagnosis not present

## 2015-12-18 DIAGNOSIS — E785 Hyperlipidemia, unspecified: Secondary | ICD-10-CM | POA: Diagnosis not present

## 2015-12-18 DIAGNOSIS — F23 Brief psychotic disorder: Secondary | ICD-10-CM | POA: Diagnosis not present

## 2015-12-18 LAB — CBC
HCT: 28.6 % — ABNORMAL LOW (ref 36.0–46.0)
Hemoglobin: 8.3 g/dL — ABNORMAL LOW (ref 12.0–15.0)
MCH: 30.1 pg (ref 26.0–34.0)
MCHC: 29 g/dL — ABNORMAL LOW (ref 30.0–36.0)
MCV: 103.6 fL — ABNORMAL HIGH (ref 78.0–100.0)
Platelets: 226 10*3/uL (ref 150–400)
RBC: 2.76 MIL/uL — ABNORMAL LOW (ref 3.87–5.11)
RDW: 21 % — ABNORMAL HIGH (ref 11.5–15.5)
WBC: 3 10*3/uL — ABNORMAL LOW (ref 4.0–10.5)

## 2015-12-18 LAB — BASIC METABOLIC PANEL
Anion gap: 9 (ref 5–15)
BUN: 17 mg/dL (ref 6–20)
CO2: 28 mmol/L (ref 22–32)
Calcium: 9.1 mg/dL (ref 8.9–10.3)
Chloride: 100 mmol/L — ABNORMAL LOW (ref 101–111)
Creatinine, Ser: 3.54 mg/dL — ABNORMAL HIGH (ref 0.44–1.00)
GFR calc Af Amer: 16 mL/min — ABNORMAL LOW (ref >60)
GFR calc non Af Amer: 13 mL/min — ABNORMAL LOW (ref >60)
Glucose, Bld: 81 mg/dL (ref 65–99)
Potassium: 5.2 mmol/L — ABNORMAL HIGH (ref 3.5–5.1)
Sodium: 137 mmol/L (ref 135–145)

## 2015-12-18 MED ORDER — LORAZEPAM 0.5 MG PO TABS: 0.5000 mg | ORAL_TABLET | Freq: Three times a day (TID) | ORAL | 0 refills | Status: AC

## 2015-12-18 MED ORDER — DOXEPIN HCL 50 MG PO CAPS: 50.0000 mg | ORAL_CAPSULE | Freq: Every day | ORAL | 0 refills | Status: AC

## 2015-12-18 MED ORDER — DIVALPROEX SODIUM ER 500 MG PO TB24: 1000.0000 mg | ORAL_TABLET | Freq: Two times a day (BID) | ORAL | 0 refills | Status: DC

## 2015-12-18 MED ORDER — DIVALPROEX SODIUM ER 500 MG PO TB24: 1000.0000 mg | ORAL_TABLET | Freq: Two times a day (BID) | ORAL | Status: DC

## 2015-12-18 MED ORDER — NICOTINE 21 MG/24HR TD PT24: 21.0000 mg | MEDICATED_PATCH | Freq: Every day | TRANSDERMAL | 0 refills | Status: AC
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
## 2015-12-18 NOTE — Discharge Instructions (Signed)
Bipolar Disorder Bipolar disorder is a mental illness. The term bipolar disorder actually is used to describe a group of disorders that all share varying degrees of emotional highs and lows that can interfere with daily functioning, such as work, school, or relationships. Bipolar disorder also can lead to drug abuse, hospitalization, and suicide. The emotional highs of bipolar disorder are periods of elation or irritability and high energy. These highs can range from a mild form (hypomania) to a severe form (mania). People experiencing episodes of hypomania may appear energetic, excitable, and highly productive. People experiencing mania may behave impulsively or erratically. They often make poor decisions. They may have difficulty sleeping. The most severe episodes of mania can involve having very distorted beliefs or perceptions about the world and seeing or hearing things that are not real (psychotic delusions and hallucinations).  The emotional lows of bipolar disorder (depression) also can range from mild to severe. Severe episodes of bipolar depression can involve psychotic delusions and hallucinations. Sometimes people with bipolar disorder experience a state of mixed mood. Symptoms of hypomania or mania and depression are both present during this mixed-mood episode. SIGNS AND SYMPTOMS There are signs and symptoms of the episodes of hypomania and mania as well as the episodes of depression. The signs and symptoms of hypomania and mania are similar but vary in severity. They include:  Inflated self-esteem or feeling of increased self-confidence.  Decreased need for sleep.  Unusual talkativeness (rapid or pressured speech) or the feeling of a need to keep talking.  Sensation of racing thoughts or constant talking, with quick shifts between topics that may or may not be related (flight of ideas).  Decreased ability to focus or concentrate.  Increased purposeful activity, such as work, studies,  or social activity, or nonproductive activity, such as pacing, squirming and fidgeting, or finger and toe tapping.  Impulsive behavior and use of poor judgment, resulting in high-risk activities, such as having unprotected sex or spending excessive amounts of money. Signs and symptoms of depression include the following:   Feelings of sadness, hopelessness, or helplessness.  Frequent or uncontrollable episodes of crying.  Lack of feeling anything or caring about anything.  Difficulty sleeping or sleeping too much.  Inability to enjoy the things you used to enjoy.   Desire to be alone all the time.   Feelings of guilt or worthlessness.  Lack of energy or motivation.   Difficulty concentrating, remembering, or making decisions.  Change in appetite or weight beyond normal fluctuations.  Thoughts of death or the desire to harm yourself. DIAGNOSIS  Bipolar disorder is diagnosed through an assessment by your caregiver. Your caregiver will ask questions about your emotional episodes. There are two main types of bipolar disorder. People with type I bipolar disorder have manic episodes with or without depressive episodes. People with type II bipolar disorder have hypomanic episodes and major depressive episodes, which are more serious than mild depression. The type of bipolar disorder you have can make an important difference in how your illness is monitored and treated. Your caregiver may ask questions about your medical history and use of alcohol or drugs, including prescription medication. Certain medical conditions and substances also can cause emotional highs and lows that resemble bipolar disorder (secondary bipolar disorder).  TREATMENT  Bipolar disorder is a long-term illness. It is best controlled with continuous treatment rather than treatment only when symptoms occur. The following treatments can be prescribed for bipolar disorders:  Medication--Medication can be prescribed by  a doctor that  is an expert in treating mental disorders (psychiatrists). Medications called mood stabilizers are usually prescribed to help control the illness. Other medications are sometimes added if symptoms of mania, depression, or psychotic delusions and hallucinations occur despite the use of a mood stabilizer. °· Talk therapy--Some forms of talk therapy are helpful in providing support, education, and guidance. °A combination of medication and talk therapy is best for managing the disorder over time. A procedure in which electricity is applied to your brain through your scalp (electroconvulsive therapy) is used in cases of severe mania when medication and talk therapy do not work or work too slowly. °  °This information is not intended to replace advice given to you by your health care provider. Make sure you discuss any questions you have with your health care provider. °  °Document Released: 08/01/2000 Document Revised: 05/16/2014 Document Reviewed: 05/21/2012 °Elsevier Interactive Patient Education ©2016 [employer] ° °

## 2015-12-18 NOTE — Progress Notes (Addendum)
RT came to do MDI with pt. Pt took Dulera 2 puffs. Pt refused for RT to check vital signs and listen to breath sounds. Sitter at bedside.

## 2015-12-18 NOTE — Progress Notes (Signed)
PROGRESS NOTE    Jennifer Lawson  [id number] DOB: May 12, 1960 DOA: 12/14/2015 PCP: Mattie Marlin PA   Chief Complaint  Patient presents with  . 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  Assessment & Plan   Acute Psychosis, h/o Bipolar disorder,  -with aggressive behavior upon arrival, requiring sedatives.  -Psychiatric consult greatly appreciated, her medication has been adjusted, clonazepam, Sinequan and Depakote were increased. -Patient will need inpatient psychiatric admission, she is medically clear for inpatient psychiatric admission, psychiatric social worker consulted for bed availability. -Have asked for reevaluation by psychiatry as patient is still waiting for inpatient psych bed, limited due to HD access  ESRD  -Hemodialysis TTS, missed last 2 sessions  -Nephrology consulted and appreciated  Essential Hypertension  -Continue metoprolol  Hyperlipidemia -Continue statin  Anemia of chronic disease -On Aranesp by Renal.  -Recent GIB due to Grade C esophagitis and Gastric ulcer due to NSAIDs  Hyperkalemia -Potassium 5.2 today -Continue to monitor BMP  GERD -Stable, continue PPI  COPD without exacerbation -Continue to monitor -nebs PRN  DVT Prophylaxis  SCDs, chemical anticoagulation held given recent gastric ulcer and esophagitis last month  Code Status: Full  Family Communication: None at bedside, spoke with daughter [name]  Disposition Plan: Admitted for observation, pending inpatient psychiatric  bed  Consultants Psychiatry Nephrology  Procedures  None  Antibiotics   Anti-infectives    None      Subjective:   [name] seen and examined today.  Patient does not want to go to mental hospital.  She would like to go home.  She denies any chest pain, shortness of breath, abdominal pain, nausea, vomiting, diarrhea or constipation, headache or dizziness.   Objective:   Vitals:   12/17/15 2010 12/17/15 2015 12/18/15 0559 12/18/15 1007  BP:  132/88 111/61 123/77  Pulse: 80 82 83 81  Resp: 16 20 20 20   Temp:  98.6 F (37 C) 98.6 F (37 C) 98 F (36.7 C)  TempSrc:  Oral Oral Oral  SpO2: 98% 100% 96% 96%  Weight:  80.8 kg (178 lb 3.2 oz)    Height:        Intake/Output Summary (Last 24 hours) at 12/18/15 1121 Last data filed at 12/18/15 0900  Gross per 24 hour  Intake              950 ml  Output                0 ml  Net              950 ml   Filed Weights   12/17/15 0700 12/17/15 1120 12/17/15 2015  Weight: 81.4 kg (179 lb 7.3 oz) 77.7 kg (171 lb 4.8 oz) 80.8 kg (178 lb 3.2 oz)    Exam  General: Well developed, well nourished, NAD, appears stated age  HEENT: NCAT, mucous membranes moist. Edentulous  Cardiovascular: S1 S2 auscultated, RRR, soft SEM  Respiratory: Clear to auscultation bilaterally with equal chest rise  Abdomen: Soft, nontender, nondistended, + bowel sounds  Extremities: warm dry without cyanosis clubbing, edema  Neuro: AAOx3,  Nonfocal  Psych: Anxious    Data Reviewed: I have personally reviewed following labs and imaging studies  CBC:  Recent Labs Lab 12/14/15 1350 12/14/15 1406 12/15/15 0526 12/17/15 0730 12/18/15 0457  WBC 4.8  --  3.5* 3.5* 3.0*  NEUTROABS 3.3  --   --   --   --   HGB 8.1* 9.2* 8.7* 7.8* 8.3*  HCT 27.6* 27.0* 30.1* 26.8* 28.6*  MCV 100.7*  --  101.7* 101.9* 103.6*  PLT 243  --  257 239 [id number]   Basic Metabolic Panel:  Recent Labs Lab 12/14/15 1350 12/14/15 1406 12/15/15 0526 12/17/15 0500  12/17/15 0730 12/18/15 0457  NA 139 139 140 138 138 137  K 5.8* 5.8* 6.0* 5.5* 5.8* 5.2*  CL 108 108 107 103 104 100*  CO2 24  --  23 25 23 28   GLUCOSE 88 83 75 79 79 81  BUN 45* 44* 47* 34* 36* 17  CREATININE 7.36* 6.90* 7.38* 5.32* 5.27* 3.54*  CALCIUM 8.7*  --  8.8* 8.8* 8.9 9.1  PHOS  --   --   --  4.3  --   --    GFR: Estimated Creatinine Clearance: 17.5 mL/min (by C-G formula based on SCr of 3.54 mg/dL). Liver Function Tests:  Recent Labs Lab 12/15/15 0526 12/17/15 0500  AST 11*  --   ALT 10*  --   ALKPHOS 102  --   BILITOT 0.4  --   PROT 6.2*  --   ALBUMIN 3.0* 2.5*   No results for input(s): LIPASE, AMYLASE in the last 168 hours. No results for input(s): AMMONIA in the last 168 hours. Coagulation Profile: No results for input(s): INR, PROTIME in the last 168 hours. Cardiac Enzymes: No results for input(s): CKTOTAL, CKMB, CKMBINDEX, TROPONINI in the last 168 hours. BNP (last 3 results) No results for input(s): PROBNP in the last 8760 hours. HbA1C: No results for input(s): HGBA1C in the last 72 hours. CBG: No results for input(s): GLUCAP in the last 168 hours. Lipid Profile: No results for input(s): CHOL, HDL, LDLCALC, TRIG, CHOLHDL, LDLDIRECT in the last 72 hours. Thyroid Function Tests: No results for input(s): TSH, T4TOTAL, FREET4, T3FREE, THYROIDAB in the last 72 hours. Anemia Panel: No results for input(s): VITAMINB12, FOLATE, FERRITIN, TIBC, IRON, RETICCTPCT in the last 72 hours. Urine analysis:    Component Value Date/Time   COLORURINE YELLOW 12/05/2015 0530   APPEARANCEUR CLEAR 12/05/2015 0530   LABSPEC 1.006 12/05/2015 0530   PHURINE 7.5 12/05/2015 0530   GLUCOSEU NEGATIVE 12/05/2015 0530   HGBUR NEGATIVE 12/05/2015 0530   BILIRUBINUR NEGATIVE 12/05/2015 0530   KETONESUR NEGATIVE 12/05/2015 0530   PROTEINUR 100 (A) 12/05/2015 0530   UROBILINOGEN 0.2 01/26/2015 1748   NITRITE NEGATIVE 12/05/2015 0530   LEUKOCYTESUR TRACE (A) 12/05/2015 0530    Sepsis Labs: @LABRCNTIP (procalcitonin:4,lacticidven:4)  )No results found for this or any previous visit (from the past 240 hour(s)).    Radiology Studies: No results found.   Scheduled Meds: . cinacalcet  60 mg Oral Q breakfast  . divalproex  750 mg Oral BID  . docusate sodium  100 mg Oral BID  . doxepin  50 mg Oral QHS  . LORazepam  1 mg Oral QHS  . metoprolol tartrate  12.5 mg Oral BID  . mometasone-formoterol  2 puff Inhalation BID  . multivitamin  1 tablet Oral QHS  . nicotine  21 mg Transdermal Daily  . pantoprazole  40 mg Oral BID  . sevelamer carbonate  1,600 mg Oral TID WC  . simvastatin  10 mg Oral QHS  . sodium chloride flush  3 mL Intravenous Q12H  . ziprasidone  60 mg Oral BID WC   Continuous Infusions:    LOS: 0 days   Time Spent in minutes   30 minutes  Carson Meche D.O. on 12/18/2015 at 11:21 AM  Between 7am to 7pm - Pager - [phone]  After 7pm go to [email or web address] - password [email or web address]  And look for the night coverage person covering for me after hours  [facility] Office  [phone]

## 2015-12-18 NOTE — Care Management Note (Addendum)
Case Management Note  Patient Details  Name: EMMORY LANMAN MRN: [id number] Date of Birth: 1961-02-19  Subjective/Objective:      CM following for progression and d/c planning.               Action/Plan: 12/18/2015 Spoke with pt CM with insurance provider, [facility] re pt status and d/c plan.  RNCM with patient insurance , Izora Gala @ [phone] ext [phone] has been following closely and will assist with any d/c needs. Please contact Izora Gala if pt is d/c to a psy hospital and when pt is d/c from the psy hospital to assist with d/c planning.  3:15pm, pt to d/c to home orders noted. This CM placed call to inform pt CM with insurance provider.  No HH needs identified.   Expected Discharge Date:                  Expected Discharge Plan:  Psychiatric Hospital  In-House Referral:  Clinical Social Work  Discharge planning Services  CM Consult  Post Acute Care Choice:  NA Choice offered to:  NA  DME Arranged:  N/A DME Agency:  NA  HH Arranged:   NA HH Agency:   NA  Status of Service:  In process, will continue to follow  If discussed at Long Length of Stay Meetings, dates discussed:    Additional Comments:  Adron Bene, RN 12/18/2015, 2:27 PM
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
## 2016-01-01 ENCOUNTER — Encounter (HOSPITAL_COMMUNITY): Payer: Self-pay | Admitting: Emergency Medicine

## 2016-01-01 ENCOUNTER — Emergency Department (HOSPITAL_COMMUNITY): Payer: Medicare Other

## 2016-01-01 ENCOUNTER — Inpatient Hospital Stay (HOSPITAL_COMMUNITY)
Admission: EM | Admit: 2016-01-01 | Discharge: 2016-01-03 | DRG: 190 | Disposition: A | Discharge status: Home / Self Care | Payer: Medicare Other | Attending: Internal Medicine | Admitting: Internal Medicine

## 2016-01-01 DIAGNOSIS — M899 Disorder of bone, unspecified: Secondary | ICD-10-CM | POA: Diagnosis present

## 2016-01-01 DIAGNOSIS — I120 Hypertensive chronic kidney disease with stage 5 chronic kidney disease or end stage renal disease: Secondary | ICD-10-CM | POA: Diagnosis not present

## 2016-01-01 DIAGNOSIS — E785 Hyperlipidemia, unspecified: Secondary | ICD-10-CM | POA: Diagnosis present

## 2016-01-01 DIAGNOSIS — D631 Anemia in chronic kidney disease: Secondary | ICD-10-CM | POA: Diagnosis present

## 2016-01-01 DIAGNOSIS — F312 Bipolar disorder, current episode manic severe with psychotic features: Secondary | ICD-10-CM | POA: Diagnosis present

## 2016-01-01 DIAGNOSIS — Z7951 Long term (current) use of inhaled steroids: Secondary | ICD-10-CM

## 2016-01-01 DIAGNOSIS — Z885 Allergy status to narcotic agent status: Secondary | ICD-10-CM | POA: Diagnosis not present

## 2016-01-01 DIAGNOSIS — Z992 Dependence on renal dialysis: Secondary | ICD-10-CM | POA: Diagnosis not present

## 2016-01-01 DIAGNOSIS — N2581 Secondary hyperparathyroidism of renal origin: Secondary | ICD-10-CM | POA: Diagnosis not present

## 2016-01-01 DIAGNOSIS — Z8249 Family history of ischemic heart disease and other diseases of the circulatory system: Secondary | ICD-10-CM | POA: Diagnosis not present

## 2016-01-01 DIAGNOSIS — J42 Unspecified chronic bronchitis: Secondary | ICD-10-CM | POA: Diagnosis not present

## 2016-01-01 DIAGNOSIS — K219 Gastro-esophageal reflux disease without esophagitis: Secondary | ICD-10-CM | POA: Diagnosis present

## 2016-01-01 DIAGNOSIS — F17210 Nicotine dependence, cigarettes, uncomplicated: Secondary | ICD-10-CM | POA: Diagnosis not present

## 2016-01-01 DIAGNOSIS — Z888 Allergy status to other drugs, medicaments and biological substances status: Secondary | ICD-10-CM | POA: Diagnosis not present

## 2016-01-01 DIAGNOSIS — J449 Chronic obstructive pulmonary disease, unspecified: Secondary | ICD-10-CM | POA: Diagnosis present

## 2016-01-01 DIAGNOSIS — Z833 Family history of diabetes mellitus: Secondary | ICD-10-CM

## 2016-01-01 DIAGNOSIS — E1122 Type 2 diabetes mellitus with diabetic chronic kidney disease: Secondary | ICD-10-CM | POA: Diagnosis not present

## 2016-01-01 DIAGNOSIS — J189 Pneumonia, unspecified organism: Secondary | ICD-10-CM | POA: Diagnosis present

## 2016-01-01 DIAGNOSIS — J4520 Mild intermittent asthma, uncomplicated: Secondary | ICD-10-CM | POA: Diagnosis not present

## 2016-01-01 DIAGNOSIS — F4310 Post-traumatic stress disorder, unspecified: Secondary | ICD-10-CM | POA: Diagnosis present

## 2016-01-01 DIAGNOSIS — D638 Anemia in other chronic diseases classified elsewhere: Secondary | ICD-10-CM | POA: Diagnosis present

## 2016-01-01 DIAGNOSIS — J45909 Unspecified asthma, uncomplicated: Secondary | ICD-10-CM | POA: Diagnosis present

## 2016-01-01 DIAGNOSIS — Z9115 Patient's noncompliance with renal dialysis: Secondary | ICD-10-CM | POA: Diagnosis not present

## 2016-01-01 DIAGNOSIS — N189 Chronic kidney disease, unspecified: Secondary | ICD-10-CM

## 2016-01-01 DIAGNOSIS — Y95 Nosocomial condition: Secondary | ICD-10-CM | POA: Diagnosis present

## 2016-01-01 DIAGNOSIS — Z96651 Presence of right artificial knee joint: Secondary | ICD-10-CM | POA: Diagnosis present

## 2016-01-01 DIAGNOSIS — J440 Chronic obstructive pulmonary disease with acute lower respiratory infection: Principal | ICD-10-CM | POA: Diagnosis present

## 2016-01-01 DIAGNOSIS — E875 Hyperkalemia: Secondary | ICD-10-CM | POA: Diagnosis present

## 2016-01-01 DIAGNOSIS — N186 End stage renal disease: Secondary | ICD-10-CM

## 2016-01-01 DIAGNOSIS — Z79899 Other long term (current) drug therapy: Secondary | ICD-10-CM

## 2016-01-01 LAB — BASIC METABOLIC PANEL
Anion gap: 11 (ref 5–15)
BUN: 34 mg/dL — ABNORMAL HIGH (ref 6–20)
CO2: 24 mmol/L (ref 22–32)
Calcium: 8.9 mg/dL (ref 8.9–10.3)
Chloride: 97 mmol/L — ABNORMAL LOW (ref 101–111)
Creatinine, Ser: 4.32 mg/dL — ABNORMAL HIGH (ref 0.44–1.00)
GFR calc Af Amer: 12 mL/min — ABNORMAL LOW (ref >60)
GFR calc non Af Amer: 11 mL/min — ABNORMAL LOW (ref >60)
Glucose, Bld: 80 mg/dL (ref 65–99)
Potassium: 4 mmol/L (ref 3.5–5.1)
Sodium: 132 mmol/L — ABNORMAL LOW (ref 135–145)

## 2016-01-01 LAB — CBC WITH DIFFERENTIAL/PLATELET
Band Neutrophils: 0 %
Basophils Absolute: 0 10*3/uL (ref 0.0–0.1)
Basophils Relative: 0 %
Blasts: 0 %
Eosinophils Absolute: 0 10*3/uL (ref 0.0–0.7)
Eosinophils Relative: 0 %
HCT: 32.3 % — ABNORMAL LOW (ref 36.0–46.0)
Hemoglobin: 9.7 g/dL — ABNORMAL LOW (ref 12.0–15.0)
Lymphocytes Relative: 8 %
Lymphs Abs: 1.2 10*3/uL (ref 0.7–4.0)
MCH: 30.2 pg (ref 26.0–34.0)
MCHC: 30 g/dL (ref 30.0–36.0)
MCV: 100.6 fL — ABNORMAL HIGH (ref 78.0–100.0)
Metamyelocytes Relative: 0 %
Monocytes Absolute: 2.6 10*3/uL — ABNORMAL HIGH (ref 0.1–1.0)
Monocytes Relative: 17 %
Myelocytes: 0 %
Neutro Abs: 11.5 10*3/uL — ABNORMAL HIGH (ref 1.7–7.7)
Neutrophils Relative %: 75 %
Other: 0 %
Platelets: 170 10*3/uL (ref 150–400)
Promyelocytes Absolute: 0 %
RBC: 3.21 MIL/uL — ABNORMAL LOW (ref 3.87–5.11)
RDW: 20.4 % — ABNORMAL HIGH (ref 11.5–15.5)
WBC Morphology: INCREASED
WBC: 15.3 10*3/uL — ABNORMAL HIGH (ref 4.0–10.5)
nRBC: 0 /100 WBC

## 2016-01-01 LAB — TROPONIN I
Troponin I: 0.07 ng/mL (ref <0.03)
Troponin I: 0.04 ng/mL (ref <0.03)

## 2016-01-01 LAB — I-STAT TROPONIN, ED: Troponin i, poc: 0.02 ng/mL (ref 0.00–0.08)

## 2016-01-01 LAB — MAGNESIUM: Magnesium: 2.4 mg/dL (ref 1.7–2.4)

## 2016-01-01 MED ORDER — SODIUM CHLORIDE 0.9% FLUSH
3.0000 mL | Freq: Two times a day (BID) | INTRAVENOUS | Status: DC
  Administered 2016-01-01 – 2016-01-02 (×2): 3 mL via INTRAVENOUS

## 2016-01-01 MED ORDER — DIVALPROEX SODIUM ER 500 MG PO TB24
750.0000 mg | ORAL_TABLET | Freq: Every day | ORAL | Status: DC
  Administered 2016-01-02: 750 mg via ORAL
  Administered 2016-01-03: 500 mg via ORAL
  Filled 2016-01-01 (×2): qty 1

## 2016-01-01 MED ORDER — NICOTINE 21 MG/24HR TD PT24
21.0000 mg | MEDICATED_PATCH | Freq: Every day | TRANSDERMAL | Status: DC
  Administered 2016-01-01 – 2016-01-03 (×3): 21 mg via TRANSDERMAL
  Filled 2016-01-01 (×4): qty 1

## 2016-01-01 MED ORDER — LORAZEPAM 0.5 MG PO TABS
0.5000 mg | ORAL_TABLET | Freq: Three times a day (TID) | ORAL | Status: DC
  Administered 2016-01-01 – 2016-01-02 (×4): 0.5 mg via ORAL
  Filled 2016-01-01 (×5): qty 1

## 2016-01-01 MED ORDER — DOCUSATE SODIUM 100 MG PO CAPS
100.0000 mg | ORAL_CAPSULE | Freq: Two times a day (BID) | ORAL | Status: DC
  Administered 2016-01-01 – 2016-01-02 (×2): 100 mg via ORAL
  Filled 2016-01-01 (×4): qty 1

## 2016-01-01 MED ORDER — SODIUM CHLORIDE 0.9 % IV SOLN: 250.0000 mL | INTRAVENOUS | Status: DC | PRN

## 2016-01-01 MED ORDER — DIVALPROEX SODIUM ER 500 MG PO TB24: 750.0000 mg | ORAL_TABLET | Freq: Every day | ORAL | Status: DC

## 2016-01-01 MED ORDER — SODIUM CHLORIDE 0.9% FLUSH: 3.0000 mL | INTRAVENOUS | Status: DC | PRN

## 2016-01-01 MED ORDER — ACETAMINOPHEN 650 MG RE SUPP: 650.0000 mg | Freq: Four times a day (QID) | RECTAL | Status: DC | PRN

## 2016-01-01 MED ORDER — METOPROLOL TARTRATE 12.5 MG HALF TABLET
12.5000 mg | ORAL_TABLET | Freq: Two times a day (BID) | ORAL | Status: DC
  Administered 2016-01-01 – 2016-01-03 (×4): 12.5 mg via ORAL
  Filled 2016-01-01 (×5): qty 1

## 2016-01-01 MED ORDER — MOMETASONE FURO-FORMOTEROL FUM 100-5 MCG/ACT IN AERO
2.0000 | INHALATION_SPRAY | Freq: Two times a day (BID) | RESPIRATORY_TRACT | Status: DC
  Administered 2016-01-01 – 2016-01-03 (×3): 2 via RESPIRATORY_TRACT
  Filled 2016-01-01 (×2): qty 8.8

## 2016-01-01 MED ORDER — ALBUTEROL SULFATE HFA 108 (90 BASE) MCG/ACT IN AERS: 2.0000 | INHALATION_SPRAY | Freq: Four times a day (QID) | RESPIRATORY_TRACT | Status: DC | PRN

## 2016-01-01 MED ORDER — SIMVASTATIN 20 MG PO TABS
10.0000 mg | ORAL_TABLET | Freq: Every day | ORAL | Status: DC
  Administered 2016-01-01 – 2016-01-02 (×2): 10 mg via ORAL
  Filled 2016-01-01 (×2): qty 1

## 2016-01-01 MED ORDER — HEPARIN SODIUM (PORCINE) 5000 UNIT/ML IJ SOLN
5000.0000 [IU] | Freq: Three times a day (TID) | INTRAMUSCULAR | Status: DC
Start: 2016-01-01 — End: 2016-01-03
  Administered 2016-01-01 – 2016-01-02 (×3): 5000 [IU] via SUBCUTANEOUS
  Filled 2016-01-01 (×3): qty 1

## 2016-01-01 MED ORDER — DEXTROSE 5 % IV SOLN
1.0000 g | INTRAVENOUS | Status: DC
  Filled 2016-01-01: qty 1

## 2016-01-01 MED ORDER — VANCOMYCIN HCL 10 G IV SOLR
1750.0000 mg | Freq: Once | INTRAVENOUS | Status: AC
  Administered 2016-01-01: 1750 mg via INTRAVENOUS
  Filled 2016-01-01: qty 1750

## 2016-01-01 MED ORDER — DIVALPROEX SODIUM ER 500 MG PO TB24: 1000.0000 mg | ORAL_TABLET | Freq: Two times a day (BID) | ORAL | Status: DC

## 2016-01-01 MED ORDER — ACETAMINOPHEN 325 MG PO TABS
650.0000 mg | ORAL_TABLET | Freq: Four times a day (QID) | ORAL | Status: DC | PRN
  Administered 2016-01-01 – 2016-01-02 (×2): 650 mg via ORAL
  Filled 2016-01-01 (×2): qty 2

## 2016-01-01 MED ORDER — SENNOSIDES-DOCUSATE SODIUM 8.6-50 MG PO TABS
2.0000 | ORAL_TABLET | Freq: Two times a day (BID) | ORAL | Status: DC
  Administered 2016-01-01 – 2016-01-03 (×3): 2 via ORAL
  Filled 2016-01-01 (×6): qty 2

## 2016-01-01 MED ORDER — DEXTROSE 5 % IV SOLN: 1.0000 g | Freq: Three times a day (TID) | INTRAVENOUS | Status: DC

## 2016-01-01 MED ORDER — CINACALCET HCL 30 MG PO TABS
60.0000 mg | ORAL_TABLET | Freq: Every day | ORAL | Status: DC
  Administered 2016-01-03: 60 mg via ORAL
  Filled 2016-01-01 (×2): qty 2

## 2016-01-01 MED ORDER — ZIPRASIDONE HCL 60 MG PO CAPS
60.0000 mg | ORAL_CAPSULE | Freq: Two times a day (BID) | ORAL | Status: DC
  Administered 2016-01-01 – 2016-01-02 (×3): 60 mg via ORAL
  Filled 2016-01-01 (×5): qty 1

## 2016-01-01 MED ORDER — SEVELAMER CARBONATE 800 MG PO TABS
1600.0000 mg | ORAL_TABLET | Freq: Three times a day (TID) | ORAL | Status: DC
  Administered 2016-01-01 – 2016-01-03 (×3): 1600 mg via ORAL
  Filled 2016-01-01 (×4): qty 2

## 2016-01-01 MED ORDER — PANTOPRAZOLE SODIUM 40 MG PO TBEC
40.0000 mg | DELAYED_RELEASE_TABLET | Freq: Two times a day (BID) | ORAL | Status: DC
  Administered 2016-01-01 – 2016-01-03 (×4): 40 mg via ORAL
  Filled 2016-01-01 (×5): qty 1

## 2016-01-01 MED ORDER — DOCUSATE SODIUM 50 MG PO CAPS
100.0000 mg | ORAL_CAPSULE | Freq: Two times a day (BID) | ORAL | Status: DC
  Filled 2016-01-01: qty 2

## 2016-01-01 MED ORDER — DIVALPROEX SODIUM ER 500 MG PO TB24
750.0000 mg | ORAL_TABLET | Freq: Two times a day (BID) | ORAL | Status: DC
  Filled 2016-01-01: qty 1

## 2016-01-01 MED ORDER — DOXEPIN HCL 50 MG PO CAPS
50.0000 mg | ORAL_CAPSULE | Freq: Every day | ORAL | Status: DC
Start: 2016-01-01 — End: 2016-01-03
  Administered 2016-01-01 – 2016-01-02 (×2): 50 mg via ORAL
  Filled 2016-01-01 (×2): qty 1

## 2016-01-01 MED ORDER — POLYETHYLENE GLYCOL 3350 17 G PO PACK
17.0000 g | PACK | Freq: Every day | ORAL | Status: DC
  Administered 2016-01-02 – 2016-01-03 (×2): 17 g via ORAL
  Filled 2016-01-01 (×2): qty 1

## 2016-01-01 MED ORDER — DEXTROSE 5 % IV SOLN
1.0000 g | Freq: Once | INTRAVENOUS | Status: AC
  Administered 2016-01-01: 1 g via INTRAVENOUS
  Filled 2016-01-01: qty 1

## 2016-01-01 MED ORDER — LEVALBUTEROL HCL 0.63 MG/3ML IN NEBU
0.6300 mg | INHALATION_SOLUTION | Freq: Four times a day (QID) | RESPIRATORY_TRACT | Status: DC | PRN
  Administered 2016-01-02 – 2016-01-03 (×3): 0.63 mg via RESPIRATORY_TRACT
  Filled 2016-01-01 (×3): qty 3

## 2016-01-01 MED ORDER — ENOXAPARIN SODIUM 40 MG/0.4ML ~~LOC~~ SOLN: 40.0000 mg | SUBCUTANEOUS | Status: DC

## 2016-01-01 MED ORDER — ALBUTEROL SULFATE (2.5 MG/3ML) 0.083% IN NEBU
2.5000 mg | INHALATION_SOLUTION | Freq: Four times a day (QID) | RESPIRATORY_TRACT | Status: DC | PRN
  Administered 2016-01-02: 2.5 mg via RESPIRATORY_TRACT

## 2016-01-01 MED ORDER — SODIUM CHLORIDE 0.9% FLUSH
3.0000 mL | Freq: Two times a day (BID) | INTRAVENOUS | Status: DC
  Administered 2016-01-01 – 2016-01-03 (×3): 3 mL via INTRAVENOUS

## 2016-01-01 NOTE — H&P (Addendum)
[facility] History and Physical  Jennifer Lawson [id number] DOB: [birth year] DOA: 01/01/2016  Referring physician:  PCP: [facility]   Chief Complaint: Shortness of breath   HPI:  55 year old female history of excessive behaviors and noncompliant with hemodialysis. Patient reportedly suffering with multiple medical problems including bipolar disorder, aggressive behavior, end-stage renal disease on HD T Th S, COPD, depression, anemia of chronic disease, diabetes, hypertension, PTSD, GERD, recently admitted and discharged on 8/11 after being treated for acute psychosis, due to noncompliance the Geodon. In addition the patient was missing her dialysis appointments, was found to be angry, lab, agitated with tangential speech. Patient was evaluated by psychiatry during her last admission.clonazepam, Sinequan and Depakote were increased. Patient was subsequently discharged to home, due to unavailability of  inpatient psych unit, due to dependency on dialysis.  Patient presents today with chest pain and cough. She does have underlying COPD and asthma, shortness of breath has been worsening for the last 3 days. Patient wanted to be 91% on room air, chest x-ray consistent with right lower lobe pneumonia. Patient otherwise hemodynamically stable except for a low-grade fever of 99.0. White blood cell count 15.3 up from 3.0. Initial troponin 0.02.       Review of Systems: negative for the following  Constitutional: Denies fever, chills, diaphoresis, appetite change and fatigue.  HEENT: Denies photophobia, eye pain, redness, hearing loss, ear pain, congestion, sore throat, rhinorrhea, sneezing, mouth sores, trouble swallowing, neck pain, neck stiffness and tinnitus.  Respiratory: Denies SOB, DOE, cough, chest tightness, and wheezing.  Respiratory: Positive for cough and shortness of breath. Gastrointestinal: Denies nausea, vomiting, abdominal pain, diarrhea, constipation, blood in stool  and abdominal distention.  Genitourinary: Denies dysuria, urgency, frequency, hematuria, flank pain and difficulty urinating.  Musculoskeletal: Denies myalgias, back pain, joint swelling, arthralgias and gait problem.  Skin: Denies pallor, rash and wound.  Neurological: Denies dizziness, seizures, syncope, weakness, light-headedness, numbness and headaches.  Hematological: Denies adenopathy. Easy bruising, personal or family bleeding history  Psychiatric/Behavioral: Denies suicidal ideation, mood changes, confusion, nervousness, sleep disturbance and agitation       Past Medical History:  Diagnosis Date  . Anemia of renal disease   . Asthma   . Bipolar 1 disorder ([place])   . Chronic kidney disease    06/11/13- not on dialysis  . Complication of anesthesia    difficulty going to sleep  . COPD (chronic obstructive pulmonary disease) ([place])   . Depression   . Diabetes mellitus without complication ([place])    denies  . GERD (gastroesophageal reflux disease)   . Heart murmur   . History of blood transfusion   . Hypertension   . Mental disorder   . Overdose   . PTSD (post-traumatic stress disorder)   . Seizures ([place])    "passsed out"  . Shortness of breath    lying down flat  . Tobacco use disorder 11/13/2012     Past Surgical History:  Procedure Laterality Date  . AV FISTULA PLACEMENT Right 06/12/2013   Procedure: ARTERIOVENOUS (AV) FISTULA CREATION; RIGHT  BASILIC VEIN TRANSPOSITION with Intraoperative ultrasound;  Surgeon: Mal [name], MD;  Location: [place];  Service: Vascular;  Laterality: Right;  . COLONOSCOPY N/A 11/30/2015   Procedure: COLONOSCOPY;  Surgeon: Wilford Corner, MD;  [facility] END[facility]COPY;  Service: Endoscopy;  Laterality: N/A;  . ESOPHAGOGASTRODUODENOSCOPY Left 11/14/2012   Procedure: ESOPHAGOGASTRODUODENOSCOPY (EGD);  Surgeon: [name], MD;  Location: [facility] ENDOSCOPY;  Service: Endoscopy;  Laterality: Left;  . ESOPHAGOGASTRODUODENOSCOPY  N/A 11/30/2015    Procedure: ESOPHAGOGASTRODUODENOSCOPY (EGD);  Surgeon: Wilford Corner, MD;  Location: [facility] ENDOSCOPY;  Service: Endoscopy;  Laterality: N/A;  . JOINT REPLACEMENT Right    knee  . PARATHYROIDECTOMY    . Right knee replacement     she says it was last year.      Social History:  reports that she has been smoking Cigarettes and Cigars.  She has a 80.00 pack-year smoking history. She has never used smokeless tobacco. She reports that she drinks alcohol. She reports that she does not use drugs.    Allergies  Allergen Reactions  . Codeine Sulfate Anaphylaxis    Daughter called about having this allergy   . Gabapentin Other (See Comments) and Anaphylaxis    seizures  . Haldol [Haloperidol Lactate] Shortness Of Breath and Other (See Comments)    hallucinations  . Risperidone And Related Shortness Of Breath and Other (See Comments)    hallucinations  . Trazodone And Nefazodone Other (See Comments)    Makes pt lose balance and fall  . Invega [Paliperidone Er] Nausea And Vomiting  . Vistaril [Hydroxyzine Hcl] Nausea And Vomiting  . Hydrocodone-Acetaminophen Itching    Not allergic to aceetaminophen    Family History  Problem Relation Age of Onset  . Diabetes Mother   . Hyperlipidemia Mother   . Hypertension Mother   . Diabetes Father   . Hypertension Father   . Hyperlipidemia Father         Prior to Admission medications   Medication Sig Start Date End Date Taking? Authorizing Provider  albuterol (PROVENTIL HFA;VENTOLIN HFA) 108 (90 Base) MCG/ACT inhaler Inhale 2 puffs into the lungs every 6 (six) hours as needed for wheezing or shortness of breath. 06/08/15  Yes Jolanta B Pucilowska, MD  albuterol (PROVENTIL) (2.5 MG/3ML) 0.083% nebulizer solution Take 2.5 mg by nebulization every 6 (six) hours as needed for wheezing.   Yes Historical Provider, MD  B Complex-C (B-COMPLEX WITH VITAMIN C) tablet Take 1 tablet by mouth daily.   Yes Historical Provider, MD  b complex-vitamin c-folic  acid (NEPHRO-VITE) 0.8 MG TABS tablet Take 1 tablet by mouth at bedtime. 07/23/15  Yes Hildred Priest, MD  Darbepoetin Alfa (ARANESP) 40 MCG/0.4ML SOSY injection Inject 0.4 mLs (40 mcg total) into the vein every Saturday with hemodialysis. 08/09/15  Yes Barton Dubois, MD  divalproex (DEPAKOTE ER) 250 MG 24 hr tablet Take 750 mg by mouth daily. 01/01/16  Yes Historical Provider, MD  Docusate Sodium (DSS) 100 MG CAPS Take 100 mg by mouth 2 (two) times daily. 06/08/15  Yes Jolanta B Pucilowska, MD  doxepin (SINEQUAN) 50 MG capsule Take 1 capsule (50 mg total) by mouth at bedtime. Patient taking differently: Take 50 mg by mouth at bedtime as needed (sleep).  12/18/15  Yes Maryann Mikhail, DO  LORazepam (ATIVAN) 0.5 MG tablet Take 1 tablet (0.5 mg total) by mouth 3 (three) times daily. 12/18/15  Yes Maryann Mikhail, DO  metoprolol tartrate (LOPRESSOR) 25 MG tablet Take 0.5 tablets (12.5 mg total) by mouth 2 (two) times daily. 08/09/15  Yes Barton Dubois, MD  mometasone-formoterol [facility]) 100-5 MCG/ACT AERO Inhale 2 puffs into the lungs 2 (two) times daily. 06/08/15  Yes Jolanta B Pucilowska, MD  ondansetron (ZOFRAN-ODT) 4 MG disintegrating tablet Take 4 mg by mouth every 6 (six) hours as needed for nausea or vomiting.   Yes Historical Provider, MD  pantoprazole (PROTONIX) 40 MG tablet Take 1 tablet (40 mg total) by mouth 2 (two) times  daily. 12/01/15  Yes Theodis Blaze, MD  polyethylene glycol [facility] / GLYCOLAX) packet Take 17 g by mouth daily.   Yes Historical Provider, MD  sennosides-docusate sodium (SENOKOT-S) 8.6-50 MG tablet Take 2 tablets by mouth 2 (two) times daily.   Yes Historical Provider, MD  sevelamer carbonate (RENVELA) 800 MG tablet Take 2 tablets (1,600 mg total) by mouth 3 (three) times daily with meals. Takes 2 tabs with each meal and snacks as directed Patient taking differently: Take 1,600 mg by mouth 3 (three) times daily with meals.  07/23/15  Yes Hildred Priest, MD   simvastatin (ZOCOR) 10 MG tablet Take 1 tablet (10 mg total) by mouth at bedtime. Reported on 05/30/2015 07/23/15  Yes Hildred Priest, MD  ziprasidone (GEODON) 60 MG capsule Take 1 capsule (60 mg total) by mouth 2 (two) times daily with a meal. 12/05/15  Yes Isla Pence, MD  cinacalcet (SENSIPAR) 60 MG tablet Take 1 tablet (60 mg total) by mouth daily. Takes with largest meal once daily Patient taking differently: Take 60 mg by mouth daily.  07/23/15   Hildred Priest, MD  divalproex (DEPAKOTE ER) 500 MG 24 hr tablet Take 2 tablets (1,000 mg total) by mouth 2 (two) times daily. 12/18/15   Maryann Mikhail, DO  furosemide (LASIX) 40 MG tablet Take 60 mg by mouth 2 (two) times daily.    Historical Provider, MD  nicotine (NICODERM CQ - DOSED IN MG/24 HOURS) 21 mg/24hr patch Place 1 patch (21 mg total) onto the skin daily. 12/18/15   Cristal Ford, DO     Physical Exam: Vitals:   01/01/16 1043 01/01/16 1417  BP:  115/99  Pulse:  82  Resp:  18  Temp:  99 F (37.2 C)  TempSrc:  Oral  SpO2: 91% 94%      Constitutional: NAD, calm, comfortable Vitals:   01/01/16 1043 01/01/16 1417  BP:  115/99  Pulse:  82  Resp:  18  Temp:  99 F (37.2 C)  TempSrc:  Oral  SpO2: 91% 94%   Eyes: PERRL, lids and conjunctivae normal ENMT: Mucous membranes are moist. Posterior pharynx clear of any exudate or lesions.Normal dentition.  Neck: normal, supple, no masses, no thyromegaly Respiratory: clear to auscultation bilaterally, no wheezing, no crackles. Normal respiratory effort. No accessory muscle use.  Cardiovascular: Regular rate and rhythm, no murmurs / rubs / gallops. No extremity edema. 2+ pedal pulses. No carotid bruits.  Abdomen: no tenderness, no masses palpated. No hepatosplenomegaly. Bowel sounds positive.  Musculoskeletal: no clubbing / cyanosis. No joint deformity upper and lower extremities. Good ROM, no contractures. Normal muscle tone.  Skin: no rashes, lesions,  ulcers. No induration Neurologic: CN 2-12 grossly intact. Sensation intact, DTR normal. Strength 5/5 in all 4.  Psychiatric: Normal judgment and insight. Alert and oriented x 3. Normal mood.     Labs on Admission: I have personally reviewed following labs and imaging studies  CBC:  Recent Labs Lab 01/01/16 1100  WBC 15.3*  NEUTROABS 11.5*  HGB 9.7*  HCT 32.3*  MCV 100.6*  PLT 123XX123    Basic Metabolic Panel:  Recent Labs Lab 01/01/16 1100  NA 132*  K 4.0  CL 97*  CO2 24  GLUCOSE 80  BUN 34*  CREATININE 4.32*  CALCIUM 8.9    GFR: CrCl cannot be calculated (Unknown ideal weight.).  Liver Function Tests: No results for input(s): AST, ALT, ALKPHOS, BILITOT, PROT, ALBUMIN in the last 168 hours. No results for input(s): LIPASE, AMYLASE in  the last 168 hours. No results for input(s): AMMONIA in the last 168 hours.  Coagulation Profile: No results for input(s): INR, PROTIME in the last 168 hours. No results for input(s): DDIMER in the last 72 hours.  Cardiac Enzymes: No results for input(s): CKTOTAL, CKMB, CKMBINDEX, TROPONINI in the last 168 hours.  BNP (last 3 results) No results for input(s): PROBNP in the last 8760 hours.  HbA1C: No results for input(s): HGBA1C in the last 72 hours. Lab Results  Component Value Date   HGBA1C 4.3 06/03/2015   HGBA1C 4.3 01/09/2015   HGBA1C 5.0 05/14/2014     CBG: No results for input(s): GLUCAP in the last 168 hours.  Lipid Profile: No results for input(s): CHOL, HDL, LDLCALC, TRIG, CHOLHDL, LDLDIRECT in the last 72 hours.  Thyroid Function Tests: No results for input(s): TSH, T4TOTAL, FREET4, T3FREE, THYROIDAB in the last 72 hours.  Anemia Panel: No results for input(s): VITAMINB12, FOLATE, FERRITIN, TIBC, IRON, RETICCTPCT in the last 72 hours.  Urine analysis:    Component Value Date/Time   COLORURINE YELLOW 12/05/2015 0530   APPEARANCEUR CLEAR 12/05/2015 0530   LABSPEC 1.006 12/05/2015 0530   PHURINE 7.5  12/05/2015 0530   GLUCOSEU NEGATIVE 12/05/2015 0530   HGBUR NEGATIVE 12/05/2015 0530   BILIRUBINUR NEGATIVE 12/05/2015 0530   KETONESUR NEGATIVE 12/05/2015 0530   PROTEINUR 100 (A) 12/05/2015 0530   UROBILINOGEN 0.2 01/26/2015 1748   NITRITE NEGATIVE 12/05/2015 0530   LEUKOCYTESUR TRACE (A) 12/05/2015 0530    Sepsis Labs: @LABRCNTIP (procalcitonin:4,lacticidven:4) )No results found for this or any previous visit (from the past 240 hour(s)).       Radiological Exams on Admission: Dg Chest 2 View  Result Date: 01/01/2016 CLINICAL DATA:  Chest pain. EXAM: CHEST  2 VIEW COMPARISON:  Radiographs of November 24, 2015. FINDINGS: Stable cardiomegaly. No pneumothorax or significant pleural effusion is noted. Left lung is clear. New right lower lobe opacity is noted most consistent with pneumonia. The visualized skeletal structures are unremarkable. IMPRESSION: Right lower lobe pneumonia. Follow-up PA and lateral radiographs of the chest in 3-4 weeks after antibiotic therapy trial is recommended to ensure resolution and rule out underlying neoplasm. Electronically Signed   By: Marijo Conception, M.D.   On: 01/01/2016 13:10   Dg Chest 2 View  Result Date: 01/01/2016 CLINICAL DATA:  Chest pain. EXAM: CHEST  2 VIEW COMPARISON:  Radiographs of November 24, 2015. FINDINGS: Stable cardiomegaly. No pneumothorax or significant pleural effusion is noted. Left lung is clear. New right lower lobe opacity is noted most consistent with pneumonia. The visualized skeletal structures are unremarkable. IMPRESSION: Right lower lobe pneumonia. Follow-up PA and lateral radiographs of the chest in 3-4 weeks after antibiotic therapy trial is recommended to ensure resolution and rule out underlying neoplasm. Electronically Signed   By: Marijo Conception, M.D.   On: 01/01/2016 13:10      EKG: Independently reviewed.    Assessment/Plan Principal Problem:   HCAP (healthcare-associated pneumonia) Patient recently hospitalized and  discharged on 8/11/ESRD Initiated coverage for healthcare associated pneumonia Patient was hypoxemic respiratory failure and is saturating well on room air  Asthma-chronic stable, without exacerbation   ESRD  -Hemodialysis TTS, missed last 2 sessions  -Nephrology consulted and appreciated  Essential Hypertension  -Continue metoprolol  Hyperlipidemia -Continue statin  Anemia of chronic disease, baseline hemoglobin around 9.2 -On Aranesp by Renal.  -Recent GIB due to Grade C esophagitis and Gastric ulcer due to NSAIDs  Hyperkalemia -Potassium 4.0 today -Continue to  monitor BMP  GERD -Stable, continue PPI  COPD without exacerbation -Continue to monitor -nebs PRN      Bipolar affective disorder, continue home medications, patient has psychiatric consultation during her most recent admission     DVT prophylaxis: *Heparin CODE STATUS-full      Disposition Plan:  Anticipate discharge in 2-3 days pending further workup and clinical progress  Consults called:  Dr Jonnie Finner   Admission status:  Inpatient  Total time spent 55 minutes.Greater than 50% of this time was spent in counseling, explanation of diagnosis, planning of further management, and coordination of care  [facility] MD [facility] Pager [phone]  If 7PM-7AM, please contact night-coverage [email or web address] Password [facility]  01/01/2016, 2:47 PM

## 2016-01-01 NOTE — Progress Notes (Signed)
  [facility] Progress Note   Subjective: admitted for cough, runny nose and some chest pain.  RA SaO2 in ED was 91%, CXR showing bibasilar patchy infiltrates.  Also + for vasc congestion , early IS edema. Temop 99.0  Vitals:   01/01/16 1043 01/01/16 1417 01/01/16 1554  BP:  115/99 105/60  Pulse:  82 82  Resp:  18 18  Temp:  99 F (37.2 C) 98.7 F (37.1 C)  TempSrc:  Oral Oral  SpO2: 91% 94% 95%  Weight:   75.3 kg (166 lb 0.1 oz)    Inpatient medications: . ceFEPime (MAXIPIME) IV  1 g Intravenous Once  . [START ON 01/02/2016] ceFEPime (MAXIPIME) IV  1 g Intravenous Q24H  . [START ON 01/02/2016] cinacalcet  60 mg Oral Q breakfast  . divalproex  1,000 mg Oral BID  . divalproex  750 mg Oral Daily  . docusate sodium  100 mg Oral BID  . doxepin  50 mg Oral QHS  . heparin subcutaneous  5,000 Units Subcutaneous Q8H  . LORazepam  0.5 mg Oral TID  . metoprolol tartrate  12.5 mg Oral BID  . mometasone-formoterol  2 puff Inhalation BID  . nicotine  21 mg Transdermal Daily  . pantoprazole  40 mg Oral BID  . polyethylene glycol  17 g Oral Daily  . senna-docusate  2 tablet Oral BID  . sevelamer carbonate  1,600 mg Oral TID WC  . simvastatin  10 mg Oral QHS  . sodium chloride flush  3 mL Intravenous Q12H  . sodium chloride flush  3 mL Intravenous Q12H  . vancomycin  1,750 mg Intravenous Once  . ziprasidone  60 mg Oral BID WC     sodium chloride, acetaminophen **OR** acetaminophen, albuterol, levalbuterol, sodium chloride flush  Exam: General:alert, coughs occ, not in distress Lungs: coarse crackles bilat bases insp/ exp, no bronchial BS, some coarse upper resp wheezing Heart: RRR . No mur. rubs, or gallops appreciated. Abdomen: Soft, non-tender, non-distended / pos BS  Extremities: 1+ edema LLE , none RLE Dialysis Access: RUA AVF + bruit  Dialysis Orders: [facility] hours 2.0K/2.0 Ca  73.5kg   400/800 UF profile 2 Heparin: NONE Mircera 225 last 8/15,  Venofer  50,  Hect 6  :Problems: 1. HCAP - for admit, IV abx 2. ESRD - TTS HD 3. Volume - 68kg standing today, 5 kg under dry wt 4. Anemia - cont meds 5. MBD - cont meds 6. Nutrition - Renal vit/prostat. 7. Bipolar disorder: per primary , at baseline  Plan - will follow, HD tomorrow , min UF    Kelly Splinter MD [place] pager [phone]    cell [phone] 01/01/2016, 4:44 PM    Recent Labs Lab 01/01/16 1100  NA 132*  K 4.0  CL 97*  CO2 24  GLUCOSE 80  BUN 34*  CREATININE 4.32*  CALCIUM 8.9   No results for input(s): AST, ALT, ALKPHOS, BILITOT, PROT, ALBUMIN in the last 168 hours.  Recent Labs Lab 01/01/16 1100  WBC 15.3*  NEUTROABS 11.5*  HGB 9.7*  HCT 32.3*  MCV 100.6*  PLT 170   Iron/TIBC/Ferritin/ %Sat    Component Value Date/Time   IRON 81 11/25/2015 1210   TIBC 197 (L) 11/25/2015 1210   FERRITIN 345 (H) 11/25/2015 1210   IRONPCTSAT 41 (H) 11/25/2015 1210

## 2016-01-01 NOTE — ED Notes (Signed)
Reinaldo Berber is in with patient at this time attempting blood draw for labs

## 2016-01-01 NOTE — Progress Notes (Signed)
Pharmacy Antibiotic Note  Jennifer Lawson is a 56 y.o. female admitted on 01/01/2016 with pneumonia. Patient is an HD patient (T, Th, Sat). Pharmacy has been consulted for Vancomycin and Cefepime dosing.  Plan: Vancomycin 1750mg  IV x1 Cefepime 1g IV every 24 hours.  Follow up HD schedule.  No data recorded.   Recent Labs Lab 01/01/16 1100  WBC 15.3*  CREATININE 4.32*    CrCl cannot be calculated (Unknown ideal weight.).    Allergies  Allergen Reactions  . Codeine Sulfate Anaphylaxis    Daughter called about having this allergy   . Gabapentin Other (See Comments) and Anaphylaxis    seizures  . Haldol [Haloperidol Lactate] Shortness Of Breath and Other (See Comments)    hallucinations  . Risperidone And Related Shortness Of Breath and Other (See Comments)    hallucinations  . Trazodone And Nefazodone Other (See Comments)    Makes pt lose balance and fall  . Invega [Paliperidone Er] Nausea And Vomiting  . Vistaril [Hydroxyzine Hcl] Nausea And Vomiting  . Hydrocodone-Acetaminophen Itching    Not allergic to aceetaminophen    Antimicrobials this admission: 8/25 Vancomycin and Cefepime    Microbiology results: 8/25 BCx: Pending  Thank you for allowing pharmacy to be a part of this patient's care.  Curt Bears Billye Nydam 01/01/2016 1:37 PM

## 2016-01-01 NOTE — ED Notes (Signed)
Patient undressed, in gown, on monitor, continuous pulse oximetry and blood pressure cuff 

## 2016-01-01 NOTE — ED Triage Notes (Signed)
Pt from home via [facility] with c/o CP with deep inspiration, cough and palpation, and productive yellow sputum cough x 3 days, cold like symptoms with grandchildren sick at home with the same.  Hx COPD.

## 2016-01-01 NOTE — ED Provider Notes (Signed)
[facility] DEPT Provider Note   CSN: [id number] Arrival date & time: 01/01/16  1036     History   Chief Complaint Chief Complaint  Patient presents with  . Chest Pain  . Cough    HPI Jennifer Lawson is a 55 y.o. female.  Patient with pertinent PMH of CKD on HD, COPD and asthma presents to the emergency department with chief complaint of chest pain and cough 3 days. She states that the chest pain is worsened when coughing.  She reports some associated SOB.  She states that her relatives have been sick with similar symptoms.  She has not tried taking anything for her symptoms.  There are no other associated symptoms.   The history is provided by the patient. No language interpreter was used.    Past Medical History:  Diagnosis Date  . Anemia of renal disease   . Asthma   . Bipolar 1 disorder ([place])   . Chronic kidney disease    06/11/13- not on dialysis  . Complication of anesthesia    difficulty going to sleep  . COPD (chronic obstructive pulmonary disease) ([place])   . Depression   . Diabetes mellitus without complication ([place])    denies  . GERD (gastroesophageal reflux disease)   . Heart murmur   . History of blood transfusion   . Hypertension   . Mental disorder   . Overdose   . PTSD (post-traumatic stress disorder)   . Seizures ([place])    "passsed out"  . Shortness of breath    lying down flat  . Tobacco use disorder 11/13/2012    Patient Active Problem List   Diagnosis Date Noted  . Psychosis 12/14/2015  . Acute blood loss anemia 11/25/2015  . GI bleed 11/24/2015  . Benign essential HTN 11/24/2015  . Physical deconditioning   . Asthma 08/06/2015  . Cerebrovascular disease 07/23/2015  . Dyslipidemia 07/14/2015  . Bipolar affective disorder, current episode manic with psychotic symptoms ([place]) 07/13/2015  . Anemia of renal disease 01/26/2015  . CKD (chronic kidney disease) stage V requiring chronic dialysis ([place]) 05/13/2014  . COPD (chronic obstructive  pulmonary disease) ([place]) 09/26/2013  . Hyperparathyroidism, primary ([place]) 02/20/2013  . Tobacco use disorder 11/13/2012  . HTN (hypertension) 02/20/2012    Past Surgical History:  Procedure Laterality Date  . AV FISTULA PLACEMENT Right 06/12/2013   Procedure: ARTERIOVENOUS (AV) FISTULA CREATION; RIGHT  BASILIC VEIN TRANSPOSITION with Intraoperative ultrasound;  Surgeon: Mal [name], MD;  Location: [facility] [place];  Service: Vascular;  Laterality: Right;  . COLONOSCOPY N/A 11/30/2015   Procedure: COLONOSCOPY;  Surgeon: Wilford Corner, MD;  Location: [facility] ENDOSCOPY;  Service: Endoscopy;  Laterality: N/A;  . ESOPHAGOGASTRODUODENOSCOPY Left 11/14/2012   Procedure: ESOPHAGOGASTRODUODENOSCOPY (EGD);  Surgeon: [name], MD;  Location: [facility] ENDOSCOPY;  Service: Endoscopy;  Laterality: Left;  . ESOPHAGOGASTRODUODENOSCOPY N/A 11/30/2015   Procedure: ESOPHAGOGASTRODUODENOSCOPY (EGD);  Surgeon: Wilford Corner, MD;  Location: [facility] ENDOSCOPY;  Service: Endoscopy;  Laterality: N/A;  . JOINT REPLACEMENT Right    knee  . PARATHYROIDECTOMY    . Right knee replacement     she says it was last year.    OB History    No data available       Home Medications    Prior to Admission medications   Medication Sig Start Date End Date Taking? Authorizing Provider  albuterol (PROVENTIL HFA;VENTOLIN HFA) 108 (90 Base) MCG/ACT inhaler Inhale 2 puffs into the lungs every 6 (six) hours as needed for wheezing or  shortness of breath. 06/08/15   Clovis Fredrickson, MD  albuterol (PROVENTIL) (2.5 MG/3ML) 0.083% nebulizer solution Take 2.5 mg by nebulization every 6 (six) hours as needed for wheezing.    Historical Provider, MD  B Complex-C (B-COMPLEX WITH VITAMIN C) tablet Take 1 tablet by mouth daily.    Historical Provider, MD  b complex-vitamin c-folic acid (NEPHRO-VITE) 0.8 MG TABS tablet Take 1 tablet by mouth at bedtime. 07/23/15   Hildred Priest, MD  cinacalcet (SENSIPAR) 60 MG tablet Take 1 tablet (60 mg  total) by mouth daily. Takes with largest meal once daily Patient taking differently: Take 60 mg by mouth daily.  07/23/15   Hildred Priest, MD  Darbepoetin Alfa (ARANESP) 40 MCG/0.4ML SOSY injection Inject 0.4 mLs (40 mcg total) into the vein every Saturday with hemodialysis. 08/09/15   Barton Dubois, MD  divalproex (DEPAKOTE ER) 500 MG 24 hr tablet Take 2 tablets (1,000 mg total) by mouth 2 (two) times daily. 12/18/15   Maryann Mikhail, DO  Docusate Sodium (DSS) 100 MG CAPS Take 100 mg by mouth 2 (two) times daily. 06/08/15   Clovis Fredrickson, MD  doxepin (SINEQUAN) 50 MG capsule Take 1 capsule (50 mg total) by mouth at bedtime. 12/18/15   Maryann Mikhail, DO  furosemide (LASIX) 40 MG tablet Take 60 mg by mouth 2 (two) times daily.    Historical Provider, MD  LORazepam (ATIVAN) 0.5 MG tablet Take 1 tablet (0.5 mg total) by mouth 3 (three) times daily. 12/18/15   Maryann Mikhail, DO  metoprolol tartrate (LOPRESSOR) 25 MG tablet Take 0.5 tablets (12.5 mg total) by mouth 2 (two) times daily. 08/09/15   Barton Dubois, MD  mometasone-formoterol [facility]) 100-5 MCG/ACT AERO Inhale 2 puffs into the lungs 2 (two) times daily. 06/08/15   Clovis Fredrickson, MD  nicotine (NICODERM CQ - DOSED IN MG/24 HOURS) 21 mg/24hr patch Place 1 patch (21 mg total) onto the skin daily. 12/18/15   Maryann Mikhail, DO  ondansetron (ZOFRAN-ODT) 4 MG disintegrating tablet Take 4 mg by mouth every 6 (six) hours as needed for nausea or vomiting.    Historical Provider, MD  pantoprazole (PROTONIX) 40 MG tablet Take 1 tablet (40 mg total) by mouth 2 (two) times daily. 12/01/15   Theodis Blaze, MD  polyethylene glycol (MIRALAX / Floria Raveling) packet Take 17 g by mouth daily.    Historical Provider, MD  sennosides-docusate sodium (SENOKOT-S) 8.6-50 MG tablet Take 2 tablets by mouth 2 (two) times daily.    Historical Provider, MD  sevelamer carbonate (RENVELA) 800 MG tablet Take 2 tablets (1,600 mg total) by mouth 3 (three) times  daily with meals. Takes 2 tabs with each meal and snacks as directed Patient taking differently: Take 1,600 mg by mouth 3 (three) times daily with meals.  07/23/15   Hildred Priest, MD  simvastatin (ZOCOR) 10 MG tablet Take 1 tablet (10 mg total) by mouth at bedtime. Reported on 05/30/2015 07/23/15   Hildred Priest, MD  ziprasidone (GEODON) 60 MG capsule Take 1 capsule (60 mg total) by mouth 2 (two) times daily with a meal. 12/05/15   Isla Pence, MD    Family History Family History  Problem Relation Age of Onset  . Diabetes Mother   . Hyperlipidemia Mother   . Hypertension Mother   . Diabetes Father   . Hypertension Father   . Hyperlipidemia Father     Social History Social History  Substance Use Topics  . Smoking status: Current Every Day Smoker  Packs/day: 2.00    Years: 40.00    Types: Cigarettes, Cigars  . Smokeless tobacco: Never Used  . Alcohol use Yes     Comment: "I like a cold beer every once in a while"      Allergies   Codeine sulfate; Gabapentin; Haldol [haloperidol lactate]; Risperidone and related; Trazodone and nefazodone; Invega [paliperidone er]; Vistaril [hydroxyzine hcl]; and Hydrocodone-acetaminophen   Review of Systems Review of Systems  Respiratory: Positive for cough and shortness of breath.   Cardiovascular: Positive for chest pain.  All other systems reviewed and are negative.    Physical Exam Updated Vital Signs LMP 05/09/2008   SpO2 91% Comment: RA  Physical Exam  Constitutional: She is oriented to person, place, and time. She appears well-developed and well-nourished.  HENT:  Head: Normocephalic and atraumatic.  Eyes: Conjunctivae and EOM are normal. Pupils are equal, round, and reactive to light.  Neck: Normal range of motion. Neck supple.  Cardiovascular: Normal rate and regular rhythm.  Exam reveals no gallop and no friction rub.   No murmur heard. Pulmonary/Chest: Effort normal. No respiratory distress. She  has no wheezes. She has rales. She exhibits no tenderness.  Abdominal: Soft. Bowel sounds are normal. She exhibits no distension and no mass. There is no tenderness. There is no rebound and no guarding.  Musculoskeletal: Normal range of motion. She exhibits no edema or tenderness.  Neurological: She is alert and oriented to person, place, and time.  Skin: Skin is warm and dry.  Psychiatric: She has a normal mood and affect. Her behavior is normal. Judgment and thought content normal.  Nursing note and vitals reviewed.    ED Treatments / Results  Labs (all labs ordered are listed, but only abnormal results are displayed) Labs Reviewed  CBC WITH DIFFERENTIAL/PLATELET  BASIC METABOLIC PANEL  [name], ED    EKG  EKG Interpretation None       Radiology No results found.  Procedures Procedures (including critical care time)  Medications Ordered in ED Medications - No data to display   Initial Impression / Assessment and Plan / ED Course  I have reviewed the triage vital signs and the nursing notes.  Pertinent labs & imaging results that were available during my care of the patient were reviewed by me and considered in my medical decision making (see chart for details).  Clinical Course    Patient with cough and chest pain. She is on dialysis.  Productive cough times several days.  Chest x-ray remarkable for pneumonia. Patient will need to be treated for healthcare associated pneumonia given that she is a dialysis patient. She has also had several recent admissions.  Appreciate the hospitalist service for admitting the patient.  Final Clinical Impressions(s) / ED Diagnoses   Final diagnoses:  HCAP (healthcare-associated pneumonia)    New Prescriptions New Prescriptions   No medications on file     Montine Circle, PA-C 01/01/16 [place], MD 01/02/16 1329

## 2016-01-01 NOTE — Progress Notes (Signed)
CRITICAL VALUE ALERT  Critical value received:  Troponin = 0.07 then 0.04  Date of notification:  01/01/16  Time of notification:  2332  Critical value read back:Yes.    Nurse who received alert:  Mady Gemma, RN   MD notified (1st page):  M. Lynch   Time of first page:  2353  Responding MD:  M. Donnal Debar  Time MD responded:  2355

## 2016-01-02 DIAGNOSIS — J189 Pneumonia, unspecified organism: Secondary | ICD-10-CM

## 2016-01-02 DIAGNOSIS — J440 Chronic obstructive pulmonary disease with acute lower respiratory infection: Secondary | ICD-10-CM | POA: Diagnosis not present

## 2016-01-02 LAB — CBC
HCT: 30.2 % — ABNORMAL LOW (ref 36.0–46.0)
Hemoglobin: 9 g/dL — ABNORMAL LOW (ref 12.0–15.0)
MCH: 29.9 pg (ref 26.0–34.0)
MCHC: 29.8 g/dL — ABNORMAL LOW (ref 30.0–36.0)
MCV: 100.3 fL — ABNORMAL HIGH (ref 78.0–100.0)
Platelets: 172 10*3/uL (ref 150–400)
RBC: 3.01 MIL/uL — ABNORMAL LOW (ref 3.87–5.11)
RDW: 20.1 % — ABNORMAL HIGH (ref 11.5–15.5)
WBC: 12.3 10*3/uL — ABNORMAL HIGH (ref 4.0–10.5)

## 2016-01-02 LAB — COMPREHENSIVE METABOLIC PANEL
ALT: 10 U/L — ABNORMAL LOW (ref 14–54)
AST: 17 U/L (ref 15–41)
Albumin: 2.8 g/dL — ABNORMAL LOW (ref 3.5–5.0)
Alkaline Phosphatase: 90 U/L (ref 38–126)
Anion gap: 14 (ref 5–15)
BUN: 44 mg/dL — ABNORMAL HIGH (ref 6–20)
CO2: 24 mmol/L (ref 22–32)
Calcium: 8.5 mg/dL — ABNORMAL LOW (ref 8.9–10.3)
Chloride: 96 mmol/L — ABNORMAL LOW (ref 101–111)
Creatinine, Ser: 5.36 mg/dL — ABNORMAL HIGH (ref 0.44–1.00)
GFR calc Af Amer: 9 mL/min — ABNORMAL LOW (ref >60)
GFR calc non Af Amer: 8 mL/min — ABNORMAL LOW (ref >60)
Glucose, Bld: 111 mg/dL — ABNORMAL HIGH (ref 65–99)
Potassium: 4.1 mmol/L (ref 3.5–5.1)
Sodium: 134 mmol/L — ABNORMAL LOW (ref 135–145)
Total Bilirubin: 0.6 mg/dL (ref 0.3–1.2)
Total Protein: 6.5 g/dL (ref 6.5–8.1)

## 2016-01-02 LAB — TROPONIN I: Troponin I: <0.03 ng/mL (ref <0.03)

## 2016-01-02 LAB — HIV ANTIBODY (ROUTINE TESTING W REFLEX): HIV Screen 4th Generation wRfx: NONREACTIVE

## 2016-01-02 MED ORDER — LIDOCAINE-PRILOCAINE 2.5-2.5 % EX CREA: 1.0000 "application " | TOPICAL_CREAM | CUTANEOUS | Status: DC | PRN

## 2016-01-02 MED ORDER — SODIUM CHLORIDE 0.9 % IV SOLN: 100.0000 mL | INTRAVENOUS | Status: DC | PRN

## 2016-01-02 MED ORDER — DEXTROSE 5 % IV SOLN
2.0000 g | INTRAVENOUS | Status: DC
  Administered 2016-01-02: 2 g via INTRAVENOUS
  Filled 2016-01-02: qty 2

## 2016-01-02 MED ORDER — HEPARIN SODIUM (PORCINE) 1000 UNIT/ML DIALYSIS: 1000.0000 [IU] | INTRAMUSCULAR | Status: DC | PRN

## 2016-01-02 MED ORDER — DARBEPOETIN ALFA 200 MCG/0.4ML IJ SOSY: 200.0000 ug | PREFILLED_SYRINGE | INTRAMUSCULAR | Status: DC

## 2016-01-02 MED ORDER — SODIUM CHLORIDE 0.9 % IV SOLN: 62.5000 mg | INTRAVENOUS | Status: DC

## 2016-01-02 MED ORDER — DOXERCALCIFEROL 4 MCG/2ML IV SOLN
6.0000 ug | INTRAVENOUS | Status: DC
  Administered 2016-01-02: 6 ug via INTRAVENOUS

## 2016-01-02 MED ORDER — VANCOMYCIN HCL IN DEXTROSE 750-5 MG/150ML-% IV SOLN
INTRAVENOUS | Status: AC
  Filled 2016-01-02: qty 150

## 2016-01-02 MED ORDER — VANCOMYCIN HCL IN DEXTROSE 750-5 MG/150ML-% IV SOLN: 750.0000 mg | INTRAVENOUS | Status: DC

## 2016-01-02 MED ORDER — PENTAFLUOROPROP-TETRAFLUOROETH EX AERO: 1.0000 "application " | INHALATION_SPRAY | CUTANEOUS | Status: DC | PRN

## 2016-01-02 MED ORDER — LIDOCAINE HCL (PF) 1 % IJ SOLN: 5.0000 mL | INTRAMUSCULAR | Status: DC | PRN

## 2016-01-02 MED ORDER — DOXERCALCIFEROL 4 MCG/2ML IV SOLN
INTRAVENOUS | Status: AC
  Filled 2016-01-02: qty 4

## 2016-01-02 MED ORDER — ALTEPLASE 2 MG IJ SOLR: 2.0000 mg | Freq: Once | INTRAMUSCULAR | Status: DC | PRN

## 2016-01-02 MED ORDER — ACETAMINOPHEN 325 MG PO TABS
ORAL_TABLET | ORAL | Status: AC
  Filled 2016-01-02: qty 2

## 2016-01-02 MED ORDER — RENA-VITE PO TABS
1.0000 | ORAL_TABLET | Freq: Every day | ORAL | Status: DC
  Administered 2016-01-02: 1 via ORAL
  Filled 2016-01-02: qty 1

## 2016-01-02 MED ORDER — VANCOMYCIN HCL IN DEXTROSE 750-5 MG/150ML-% IV SOLN
750.0000 mg | INTRAVENOUS | Status: AC
Start: 2016-01-02 — End: 2016-01-02
  Administered 2016-01-02: 750 mg via INTRAVENOUS

## 2016-01-02 MED ORDER — ALBUTEROL SULFATE (2.5 MG/3ML) 0.083% IN NEBU
INHALATION_SOLUTION | RESPIRATORY_TRACT | Status: AC
  Filled 2016-01-02: qty 3

## 2016-01-02 MED ORDER — WHITE PETROLATUM GEL
Status: AC
  Filled 2016-01-02: qty 1

## 2016-01-02 NOTE — Progress Notes (Signed)
Asked pt if she wanted anything to snack on to take her meds until her lunch tray came; pt declined offer. Pt took pills with cranberry juice without issue.

## 2016-01-02 NOTE — Progress Notes (Signed)
PT Cancellation Note  Patient Details Name: PEARSON MEDARIS MRN: [id number] DOB: 25-Mar-1961   Cancelled Treatment:    Reason Eval/Treat Not Completed: Patient at procedure or test/unavailable   Currently in HD;  Will follow up later today as time allows;  Otherwise, will follow up for PT tomorrow;   Thank you,  Roney Marion, PT  Acute Rehabilitation Services Pager [phone] Office [phone]     Roney Marion [facility] 01/02/2016, 10:39 AM

## 2016-01-02 NOTE — Progress Notes (Signed)
PROGRESS NOTE                                                                                                                                                                                                             Patient Demographics:    Jennifer Lawson, is a 55 y.o. female, DOB - [birth year], [id number]  Admit date - 01/01/2016   Admitting Physician Reyne Dumas, MD  Outpatient Primary MD for the patient is [place] [facility]  LOS - 1  Chief Complaint  Patient presents with  . Chest Pain  . Cough       Brief Narrative   55 year old female history of excessive behaviors and noncompliant with hemodialysis. Patient reportedly suffering with multiple medical problems including bipolar disorder, aggressive behavior, end-stage renal disease on HD T Th S, COPD, depression, anemia of chronic disease, diabetes, hypertension, PTSD, GERD, recently admitted and discharged on 8/11 after being treated for acute psychosis, due to noncompliance the Geodon. In addition the patient was missing her dialysis appointments, was found to be angry, lab, agitated with tangential speech. Patient was evaluated by psychiatry during her last admission.clonazepam, Sinequan and Depakote were increased. Patient was subsequently discharged to home, due to unavailability of  inpatient psych unit, due to dependency on dialysis.   Patient presents today with chest pain and cough. She does have underlying COPD and asthma, shortness of breath has been worsening for the last 3 days. Patient wanted to be 91% on room air, chest x-ray consistent with right lower lobe pneumonia. Patient otherwise hemodynamically stable except for a low-grade fever of 99.0. White blood cell count 15.3 up from 3.0. Initial troponin 0.02.     Subjective:    Jennifer Lawson today has, Jennifer headache, Jennifer chest pain, Jennifer abdominal pain - Jennifer Nausea, Jennifer new weakness tingling or  numbness, Improved Cough & SOB.     Assessment  & Plan :     1. Pneumonia. Patient was recently admitted and discharged, also has ESRD requiring routine dialysis, much improved on empiric IV antibiotics which will be continued for now, continue supportive care and monitor cultures.  2. COPD & Asthma. Stable Jennifer wheezing. Supportive care as above.  3. ESRD. On Tuesday, Thursday, dialysis, missed last 2 sessions prior to admission, nephrology on board will be dialyzed as needed.  4. Essential hypertension. Stable  on beta blocker.  5. Dyslipidemia - Statin  6. AOCD - stable, has history of recent upper GI bleed due to esophagitis and gastric ulcer caused by NSAIDs. Will be monitored. He is currently on PPI twice a day.    Family Communication  :  None  Code Status :  Full  Diet : Renal  Disposition Plan  :  Stay Inpt  Consults  :  Renal  Procedures  :      DVT Prophylaxis  :   Heparin    Lab Results  Component Value Date   PLT 172 01/02/2016    Inpatient Medications  Scheduled Meds: . white petrolatum      . ceFEPime (MAXIPIME) IV  1 g Intravenous Q24H  . cinacalcet  60 mg Oral Q breakfast  . divalproex  750 mg Oral Daily  . docusate sodium  100 mg Oral BID  . doxepin  50 mg Oral QHS  . heparin subcutaneous  5,000 Units Subcutaneous Q8H  . LORazepam  0.5 mg Oral TID  . metoprolol tartrate  12.5 mg Oral BID  . mometasone-formoterol  2 puff Inhalation BID  . nicotine  21 mg Transdermal Daily  . pantoprazole  40 mg Oral BID  . polyethylene glycol  17 g Oral Daily  . senna-docusate  2 tablet Oral BID  . sevelamer carbonate  1,600 mg Oral TID WC  . simvastatin  10 mg Oral QHS  . sodium chloride flush  3 mL Intravenous Q12H  . sodium chloride flush  3 mL Intravenous Q12H  . ziprasidone  60 mg Oral BID WC   Continuous Infusions:  PRN Meds:.sodium chloride, acetaminophen **OR** acetaminophen, albuterol, levalbuterol, sodium chloride flush  Antibiotics  :     Anti-infectives    Start     Dose/Rate Route Frequency Ordered Stop   01/02/16 1800  ceFEPIme (MAXIPIME) 1 g in dextrose 5 % 50 mL IVPB     1 g 100 mL/hr over 30 Minutes Intravenous Every 24 hours 01/01/16 1401     01/01/16 1500  ceFEPIme (MAXIPIME) 1 g in dextrose 5 % 50 mL IVPB  Status:  Discontinued     1 g 100 mL/hr over 30 Minutes Intravenous Every 8 hours 01/01/16 1455 01/01/16 1456   01/01/16 1430  vancomycin (VANCOCIN) 1,750 mg in sodium chloride 0.9 % 500 mL IVPB     1,750 mg 250 mL/hr over 120 Minutes Intravenous  Once 01/01/16 1352 01/01/16 1917   01/01/16 1330  ceFEPIme (MAXIPIME) 1 g in dextrose 5 % 50 mL IVPB     1 g 100 mL/hr over 30 Minutes Intravenous  Once 01/01/16 1323 01/01/16 1745         Objective:   Vitals:   01/02/16 0905 01/02/16 0910 01/02/16 0914 01/02/16 0918  BP: 135/67 (!) 143/91 132/75 120/85  Pulse: 90 92 92 90  Resp: 18 17    Temp: 98.6 F (37 C) 98.4 F (36.9 C)    TempSrc: Oral Oral    SpO2: 95% 96%    Weight:  76.7 kg (169 lb 1.5 oz)      Wt Readings from Last 3 Encounters:  01/02/16 76.7 kg (169 lb 1.5 oz)  12/17/15 80.8 kg (178 lb 3.2 oz)  12/05/15 75.2 kg (165 lb 12.6 oz)     Intake/Output Summary (Last 24 hours) at 01/02/16 0949 Last data filed at 01/02/16 0900  Gross per 24 hour  Intake  120 ml  Output                0 ml  Net              120 ml     Physical Exam  Awake Alert, Oriented X 2, Jennifer new F.N deficits,   Jennifer Lawson,Jennifer Lawson,Jennifer Lawson, Jennifer cervical lymphadenopathy appriciated.  Symmetrical Chest wall movement, Good air movement bilaterally, CTAB RRR,Jennifer Gallops,Rubs or new Murmurs, Jennifer Parasternal Heave +ve B.Sounds, Abd Soft, Jennifer tenderness, Jennifer organomegaly appriciated, Jennifer rebound - guarding or rigidity. Jennifer Cyanosis, Clubbing or edema, Jennifer new Rash or bruise       Data Review:    CBC  Recent Labs Lab 01/01/16 1100 01/02/16 0554  WBC 15.3* 12.3*  HGB 9.7* 9.0*  HCT 32.3* 30.2*  PLT  170 172  MCV 100.6* 100.3*  MCH 30.2 29.9  MCHC 30.0 29.8*  RDW 20.4* 20.1*  LYMPHSABS 1.2  --   MONOABS 2.6*  --   EOSABS 0.0  --   BASOSABS 0.0  --     Chemistries   Recent Labs Lab 01/01/16 1100 01/01/16 1839 01/02/16 0554  NA 132*  --  134*  K 4.0  --  4.1  CL 97*  --  96*  CO2 24  --  24  GLUCOSE 80  --  111*  BUN 34*  --  44*  CREATININE 4.32*  --  5.36*  CALCIUM 8.9  --  8.5*  MG  --  2.4  --   AST  --   --  17  ALT  --   --  10*  ALKPHOS  --   --  90  BILITOT  --   --  0.6   ------------------------------------------------------------------------------------------------------------------ Jennifer results for input(s): CHOL, HDL, LDLCALC, TRIG, CHOLHDL, LDLDIRECT in the last 72 hours.  Lab Results  Component Value Date   HGBA1C 4.3 06/03/2015   ------------------------------------------------------------------------------------------------------------------ Jennifer results for input(s): TSH, T4TOTAL, T3FREE, THYROIDAB in the last 72 hours.  Invalid input(s): FREET3 ------------------------------------------------------------------------------------------------------------------ Jennifer results for input(s): VITAMINB12, FOLATE, FERRITIN, TIBC, IRON, RETICCTPCT in the last 72 hours.  Coagulation profile Jennifer results for input(s): INR, PROTIME in the last 168 hours.  Jennifer results for input(s): DDIMER in the last 72 hours.  Cardiac Enzymes  Recent Labs Lab 01/01/16 1839 01/01/16 2044 01/02/16 0554  TROPONINI 0.07* 0.04* <0.03   ------------------------------------------------------------------------------------------------------------------    Component Value Date/Time   BNP 155.4 (H) 12/05/2015 0556    Micro Results Jennifer results found for this or any previous visit (from the past 240 hour(s)).  Radiology Reports Dg Chest 2 View  Result Date: 01/01/2016 CLINICAL DATA:  Chest pain. EXAM: CHEST  2 VIEW COMPARISON:  Radiographs of November 24, 2015. FINDINGS: Stable  cardiomegaly. Jennifer pneumothorax or significant pleural effusion is noted. Left lung is clear. New right lower lobe opacity is noted most consistent with pneumonia. The visualized skeletal structures are unremarkable. IMPRESSION: Right lower lobe pneumonia. Follow-up PA and lateral radiographs of the chest in 3-4 weeks after antibiotic therapy trial is recommended to ensure resolution and rule out underlying neoplasm. Electronically Signed   By: Marijo Conception, M.D.   On: 01/01/2016 13:10    Time Spent in minutes  30   Sheranda Seabrooks K M.D on 01/02/2016 at 9:49 AM  Between 7am to 7pm - Pager - [phone]  After 7pm go to [email or web address] - password [facility]  [facility] -  Office  [phone]

## 2016-01-02 NOTE — Consult Note (Signed)
[facility] Renal Consultation Note    Indication for Consultation:  Management of ESRD/hemodialysis; anemia, hypertension/volume and secondary hyperparathyroidism  HPI: Jennifer Lawson is a 55 y.o. female with ESRD on TTS HD at [facility] with PMHx significant for HTN, DM, bipolar d/o, COPD, tobacco use, hx of dialysis and medication non adherence who presented yesterday with chest pain and cough with CXR showing RML PNA.  She had a low grade temp and WBC of 15.3.  She had attended dialysis on Tuesday and Thursday of this week for about 3.25 hr of her 4 hr prescribed treatments on each of those days.  Her last treatment was 8/24 with post wt 74.4 and net UF 5.6 L post HD SBP as in the 120 - 130 range. At present, she is not coughing nor is she short of breath.  She denies N, V, D, fever of chills.  She hasn't had anything yet to eat today.  She "knows" that she should stop smoking and shows me the nicotine patch on her arm.  She is currently on HD. Initial troponin was elevated at 0.07 and has trending down to < 0.03  Past Medical History:  Diagnosis Date  . Anemia of renal disease   . Asthma   . Bipolar 1 disorder ([place])   . Chronic kidney disease    06/11/13- not on dialysis  . Complication of anesthesia    difficulty going to sleep  . COPD (chronic obstructive pulmonary disease) ([place])   . Depression   . Diabetes mellitus without complication ([place])    denies  . GERD (gastroesophageal reflux disease)   . Heart murmur   . History of blood transfusion   . Hypertension   . Mental disorder   . Overdose   . PTSD (post-traumatic stress disorder)   . Seizures ([place])    "passsed out"  . Shortness of breath    lying down flat  . Tobacco use disorder 11/13/2012   Past Surgical History:  Procedure Laterality Date  . AV FISTULA PLACEMENT Right 06/12/2013   Procedure: ARTERIOVENOUS (AV) FISTULA CREATION; RIGHT  BASILIC VEIN TRANSPOSITION with Intraoperative ultrasound;  Surgeon: Mal [name], MD;  Location: [facility];  Service: Vascular;  Laterality: Right;  . COLONOSCOPY N/A 11/30/2015   Procedure: COLONOSCOPY;  Surgeon: Wilford Corner, MD;  Location: [facility] ENDOSCOPY;  Service: Endoscopy;  Laterality: N/A;  . ESOPHAGOGASTRODUODENOSCOPY Left 11/14/2012   Procedure: ESOPHAGOGASTRODUODENOSCOPY (EGD);  Surgeon: [name], MD;  Location: [facility] ENDOSCOPY;  Service: Endoscopy;  Laterality: Left;  . ESOPHAGOGASTRODUODENOSCOPY N/A 11/30/2015   Procedure: ESOPHAGOGASTRODUODENOSCOPY (EGD);  Surgeon: Wilford Corner, MD;  Location: [facility] ENDOSCOPY;  Service: Endoscopy;  Laterality: N/A;  . JOINT REPLACEMENT Right    knee  . PARATHYROIDECTOMY    . Right knee replacement     she says it was last year.   Family History  Problem Relation Age of Onset  . Diabetes Mother   . Hyperlipidemia Mother   . Hypertension Mother   . Diabetes Father   . Hypertension Father   . Hyperlipidemia Father    Social History:  reports that she has been smoking Cigarettes and Cigars.  She has a 80.00 pack-year smoking history. She has never used smokeless tobacco. She reports that she drinks alcohol. She reports that she does not use drugs. Allergies  Allergen Reactions  . Codeine Sulfate Anaphylaxis    Daughter called about having this allergy   . Gabapentin Other (See Comments) and Anaphylaxis    seizures  .  Haldol [Haloperidol Lactate] Shortness Of Breath and Other (See Comments)    hallucinations  . Risperidone And Related Shortness Of Breath and Other (See Comments)    hallucinations  . Trazodone And Nefazodone Other (See Comments)    Makes pt lose balance and fall  . Invega [Paliperidone Er] Nausea And Vomiting  . Vistaril [Hydroxyzine Hcl] Nausea And Vomiting  . Hydrocodone-Acetaminophen Itching    Not allergic to aceetaminophen   Prior to Admission medications   Medication Sig Start Date End Date Taking? Authorizing Provider  albuterol (PROVENTIL HFA;VENTOLIN HFA) 108 (90 Base) MCG/ACT inhaler  Inhale 2 puffs into the lungs every 6 (six) hours as needed for wheezing or shortness of breath. 06/08/15  Yes Jolanta B Pucilowska, MD  albuterol (PROVENTIL) (2.5 MG/3ML) 0.083% nebulizer solution Take 2.5 mg by nebulization every 6 (six) hours as needed for wheezing.   Yes Historical Provider, MD  B Complex-C (B-COMPLEX WITH VITAMIN C) tablet Take 1 tablet by mouth daily.   Yes Historical Provider, MD  b complex-vitamin c-folic acid (NEPHRO-VITE) 0.8 MG TABS tablet Take 1 tablet by mouth at bedtime. 07/23/15  Yes Hildred Priest, MD  Darbepoetin Alfa (ARANESP) 40 MCG/0.4ML SOSY injection Inject 0.4 mLs (40 mcg total) into the vein every Saturday with hemodialysis. 08/09/15  Yes Barton Dubois, MD  divalproex (DEPAKOTE ER) 250 MG 24 hr tablet Take 750 mg by mouth daily. 01/01/16  Yes Historical Provider, MD  Docusate Sodium (DSS) 100 MG CAPS Take 100 mg by mouth 2 (two) times daily. 06/08/15  Yes Jolanta B Pucilowska, MD  doxepin (SINEQUAN) 50 MG capsule Take 1 capsule (50 mg total) by mouth at bedtime. Patient taking differently: Take 50 mg by mouth at bedtime as needed (sleep).  12/18/15  Yes Maryann Mikhail, DO  LORazepam (ATIVAN) 0.5 MG tablet Take 1 tablet (0.5 mg total) by mouth 3 (three) times daily. 12/18/15  Yes Maryann Mikhail, DO  metoprolol tartrate (LOPRESSOR) 25 MG tablet Take 0.5 tablets (12.5 mg total) by mouth 2 (two) times daily. 08/09/15  Yes Barton Dubois, MD  mometasone-formoterol [facility]) 100-5 MCG/ACT AERO Inhale 2 puffs into the lungs 2 (two) times daily. 06/08/15  Yes Jolanta B Pucilowska, MD  ondansetron (ZOFRAN-ODT) 4 MG disintegrating tablet Take 4 mg by mouth every 6 (six) hours as needed for nausea or vomiting.   Yes Historical Provider, MD  pantoprazole (PROTONIX) 40 MG tablet Take 1 tablet (40 mg total) by mouth 2 (two) times daily. 12/01/15  Yes Theodis Blaze, MD  polyethylene glycol (MIRALAX / Floria Raveling) packet Take 17 g by mouth daily.   Yes Historical Provider, MD   sennosides-docusate sodium (SENOKOT-S) 8.6-50 MG tablet Take 2 tablets by mouth 2 (two) times daily.   Yes Historical Provider, MD  sevelamer carbonate (RENVELA) 800 MG tablet Take 2 tablets (1,600 mg total) by mouth 3 (three) times daily with meals. Takes 2 tabs with each meal and snacks as directed Patient taking differently: Take 1,600 mg by mouth 3 (three) times daily with meals.  07/23/15  Yes Hildred Priest, MD  simvastatin (ZOCOR) 10 MG tablet Take 1 tablet (10 mg total) by mouth at bedtime. Reported on 05/30/2015 07/23/15  Yes Hildred Priest, MD  ziprasidone (GEODON) 60 MG capsule Take 1 capsule (60 mg total) by mouth 2 (two) times daily with a meal. 12/05/15  Yes Isla Pence, MD  cinacalcet (SENSIPAR) 60 MG tablet Take 1 tablet (60 mg total) by mouth daily. Takes with largest meal once daily Patient taking differently: Take  60 mg by mouth daily.  07/23/15   Hildred Priest, MD  divalproex (DEPAKOTE ER) 500 MG 24 hr tablet Take 2 tablets (1,000 mg total) by mouth 2 (two) times daily. 12/18/15   Maryann Mikhail, DO  furosemide (LASIX) 40 MG tablet Take 60 mg by mouth 2 (two) times daily.    Historical Provider, MD  nicotine (NICODERM CQ - DOSED IN MG/24 HOURS) 21 mg/24hr patch Place 1 patch (21 mg total) onto the skin daily. 12/18/15   Maryann Mikhail, DO   Current Facility-Administered Medications  Medication Dose Route Frequency Provider Last Rate Last Dose  . white petrolatum (VASELINE) gel           . 0.9 %  sodium chloride infusion  250 mL Intravenous PRN Reyne Dumas, MD      . acetaminophen (TYLENOL) tablet 650 mg  650 mg Oral Q6H PRN Reyne Dumas, MD   650 mg at 01/01/16 2236   Or  . acetaminophen (TYLENOL) suppository 650 mg  650 mg Rectal Q6H PRN Reyne Dumas, MD      . albuterol (PROVENTIL) (2.5 MG/3ML) 0.083% nebulizer solution 2.5 mg  2.5 mg Nebulization Q6H PRN Reyne Dumas, MD      . ceFEPIme (MAXIPIME) 1 g in dextrose 5 % 50 mL IVPB  1 g  Intravenous Q24H Wynell Balloon, RPH      . cinacalcet (SENSIPAR) tablet 60 mg  60 mg Oral Q breakfast Reyne Dumas, MD      . divalproex (DEPAKOTE ER) 24 hr tablet 750 mg  750 mg Oral Daily Reyne Dumas, MD      . docusate sodium (COLACE) capsule 100 mg  100 mg Oral BID Reyne Dumas, MD   100 mg at 01/01/16 2127  . doxepin (SINEQUAN) capsule 50 mg  50 mg Oral QHS Reyne Dumas, MD   50 mg at 01/01/16 2129  . heparin injection 5,000 Units  5,000 Units Subcutaneous Q8H Reyne Dumas, MD   5,000 Units at 01/02/16 0629  . levalbuterol (XOPENEX) nebulizer solution 0.63 mg  0.63 mg Nebulization Q6H PRN Reyne Dumas, MD   0.63 mg at 01/02/16 0207  . LORazepam (ATIVAN) tablet 0.5 mg  0.5 mg Oral TID Reyne Dumas, MD   0.5 mg at 01/01/16 2129  . metoprolol tartrate (LOPRESSOR) tablet 12.5 mg  12.5 mg Oral BID Reyne Dumas, MD   12.5 mg at 01/01/16 2127  . mometasone-formoterol (DULERA) 100-5 MCG/ACT inhaler 2 puff  2 puff Inhalation BID Reyne Dumas, MD   2 puff at 01/01/16 2144  . nicotine (NICODERM CQ - dosed in mg/24 hours) patch 21 mg  21 mg Transdermal Daily Reyne Dumas, MD   21 mg at 01/01/16 1715  . pantoprazole (PROTONIX) EC tablet 40 mg  40 mg Oral BID Reyne Dumas, MD   40 mg at 01/01/16 2129  . polyethylene glycol (MIRALAX / GLYCOLAX) packet 17 g  17 g Oral Daily Reyne Dumas, MD      . senna-docusate (Senokot-S) tablet 2 tablet  2 tablet Oral BID Reyne Dumas, MD   2 tablet at 01/01/16 2127  . sevelamer carbonate (RENVELA) tablet 1,600 mg  1,600 mg Oral TID WC Reyne Dumas, MD   1,600 mg at 01/01/16 1829  . simvastatin (ZOCOR) tablet 10 mg  10 mg Oral QHS Reyne Dumas, MD   10 mg at 01/01/16 2127  . sodium chloride flush (NS) 0.9 % injection 3 mL  3 mL Intravenous Q12H Reyne Dumas, MD   3 mL  at 01/01/16 1716  . sodium chloride flush (NS) 0.9 % injection 3 mL  3 mL Intravenous Q12H Reyne Dumas, MD   3 mL at 01/01/16 1716  . sodium chloride flush (NS) 0.9 % injection 3 mL  3 mL Intravenous PRN  Reyne Dumas, MD      . ziprasidone (GEODON) capsule 60 mg  60 mg Oral BID WC Reyne Dumas, MD   60 mg at 01/01/16 1715   Labs: Basic Metabolic Panel:  Recent Labs Lab 01/01/16 1100 01/02/16 0554  NA 132* 134*  K 4.0 4.1  CL 97* 96*  CO2 24 24  GLUCOSE 80 111*  BUN 34* 44*  CREATININE 4.32* 5.36*  CALCIUM 8.9 8.5*   Liver Function Tests:  Recent Labs Lab 01/02/16 0554  AST 17  ALT 10*  ALKPHOS 90  BILITOT 0.6  PROT 6.5  ALBUMIN 2.8*   CBC:  Recent Labs Lab 01/01/16 1100 01/02/16 0554  WBC 15.3* 12.3*  NEUTROABS 11.5*  --   HGB 9.7* 9.0*  HCT 32.3* 30.2*  MCV 100.6* 100.3*  PLT 170 172   Cardiac Enzymes:  Recent Labs Lab 01/01/16 1839 01/01/16 2044 01/02/16 0554  TROPONINI 0.07* 0.04* <0.03   CBG: No results for input(s): GLUCAP in the last 168 hours. Iron Studies: No results for input(s): IRON, TIBC, TRANSFERRIN, FERRITIN in the last 72 hours. Studies/Results: Dg Chest 2 View  Result Date: 01/01/2016 CLINICAL DATA:  Chest pain. EXAM: CHEST  2 VIEW COMPARISON:  Radiographs of November 24, 2015. FINDINGS: Stable cardiomegaly. No pneumothorax or significant pleural effusion is noted. Left lung is clear. New right lower lobe opacity is noted most consistent with pneumonia. The visualized skeletal structures are unremarkable. IMPRESSION: Right lower lobe pneumonia. Follow-up PA and lateral radiographs of the chest in 3-4 weeks after antibiotic therapy trial is recommended to ensure resolution and rule out underlying neoplasm. Electronically Signed   By: Marijo Conception, M.D.   On: 01/01/2016 13:10    ROS: As per HPI otherwise negative.  Physical Exam: Vitals:   01/02/16 0905 01/02/16 0910 01/02/16 0914 01/02/16 0918  BP: 135/67 (!) 143/91 132/75 120/85  Pulse: 90 92 92 90  Resp: 18 17    Temp: 98.6 F (37 C) 98.4 F (36.9 C)    TempSrc: Oral Oral    SpO2: 95% 96%    Weight:  76.7 kg (169 lb 1.5 oz)       General: WDWN NAD wearing O2 Head: NCAT  sclera not icteric MMM Neck: Supple.  Lungs:  Fes crackles bilaterally; Breathing is unlabored. Difficult to hear due to talking even when I ask her to stop Heart: RRR with S1 S2.  Abdomen: soft NT + BS Lower extremities:without edema or ischemic changes, no open wounds  Neuro: A & O  X 3. Moves all extremities spontaneously. Psych: very talkative - constantly asking questions and making observations throughout exam Dialysis Access:right upper AVF   Dialysis Orders: TTS [place] 4 hr 2 K 2 Ca profile 2 hectorol 6 EDW 73.5 (74.4 post HD 8/24 with net UF 5.6 L) venofer 50 per week mircera 225 - last dose 8/15  Assessment/Plan: 1.  RRL PNA - WBC 12.3 Maxepime and Vanc per primary- 2.  ESRD -  TTS K 4.1-changed to 3 K bath for the remainder of treament; running full 4 hours - kinetics are marginal at 3.5 hours 3.  Hypertension/volume  - standing wt to day 76/7 (EDW 73.5 ) BP 120s on HD -  goal 3 L; standing wt yesterday 68 was in error; BP now down to 105 - will not go for full EDW 4. Anemia  - hgb 9 - Max ESA due next week - continue weekly Fe 5. Metabolic bone disease -  Hectorol/renvela/sensipar 6. Nutrition - alb 2.8 changed to renal diet with fluid restriction- add renavites 7. tobacco addiction - wearing patch - needs to continue after d/c 8. Bipolar d/o - baseline - per primary  Myriam Jacobson, PA-C [place] [phone] 01/02/2016, 9:48 AM      Pt seen, examined and agree w A/P as above.  Kelly Splinter MD [employer] pager [phone]    cell [phone] 01/02/2016, 10:59 AM

## 2016-01-02 NOTE — Procedures (Signed)
  I was present at this dialysis session, have reviewed the session itself and made  appropriate changes Kelly Splinter MD [place] pager [phone]    cell [phone] 01/02/2016, 10:59 AM

## 2016-01-02 NOTE — Progress Notes (Signed)
Pt eating cheesecake that she states her family brought her last night. Cheese cake has not been in the refrigerator at all since. Pt advised that the cheesecake may not sit well with her stomach since it has been sitting out on her bedside table all night; pt still decides to eat it. Will continue to monitor.

## 2016-01-02 NOTE — Progress Notes (Signed)
Pt states "you should've given me ativan in my IV." I informed pt that I could not give her IV ativan because the order was written for oral. Pt stated "well that's how they gave it to me last night." Checked MAR to see route of ativan administration; since this admission ativan has been given orally. Informed pt of that and gave her ativan 0.5mg  orally as ordered.

## 2016-01-03 DIAGNOSIS — J189 Pneumonia, unspecified organism: Secondary | ICD-10-CM | POA: Diagnosis not present

## 2016-01-03 DIAGNOSIS — J440 Chronic obstructive pulmonary disease with acute lower respiratory infection: Secondary | ICD-10-CM | POA: Diagnosis not present

## 2016-01-03 LAB — CBC
HCT: 31 % — ABNORMAL LOW (ref 36.0–46.0)
Hemoglobin: 9 g/dL — ABNORMAL LOW (ref 12.0–15.0)
MCH: 29.4 pg (ref 26.0–34.0)
MCHC: 29 g/dL — ABNORMAL LOW (ref 30.0–36.0)
MCV: 101.3 fL — ABNORMAL HIGH (ref 78.0–100.0)
Platelets: 177 10*3/uL (ref 150–400)
RBC: 3.06 MIL/uL — ABNORMAL LOW (ref 3.87–5.11)
RDW: 19.8 % — ABNORMAL HIGH (ref 11.5–15.5)
WBC: 8.4 10*3/uL (ref 4.0–10.5)

## 2016-01-03 LAB — GLUCOSE, CAPILLARY: Glucose-Capillary: 107 mg/dL — ABNORMAL HIGH (ref 65–99)

## 2016-01-03 MED ORDER — LEVOFLOXACIN 500 MG PO TABS
250.0000 mg | ORAL_TABLET | ORAL | Status: DC
  Administered 2016-01-03: 250 mg via ORAL
  Filled 2016-01-03: qty 1

## 2016-01-03 MED ORDER — LEVOFLOXACIN 250 MG PO TABS: 250.0000 mg | ORAL_TABLET | ORAL | 0 refills | Status: DC

## 2016-01-03 NOTE — Progress Notes (Signed)
Patient noncompliant with care today; MD aware of patient's noncompliance.  Refused some to none of her daily medications (partial dose of Depakote refused and total doses of other daily meds refused).  This RN called her daughter Jennifer Lawson) to pick patient up due to patient refusal to call her transportation and refusing discharge instructions. Daughter voiced understanding.

## 2016-01-03 NOTE — Care Management (Signed)
CM met with patient at bedside to discuss discharge planning, and recommendation for [facility] services patient is being discharged home today. She has had 9 ED visits that resulted in 2 hospital stays, patient has declined, stating she does not want anyone in her daughters home. Patient has a psych hx and on HD T/Th/Sat, she lives with daughter [name] [phone].  CM attempted to contact daughter [name] today to discuss recommendations for transitional care, unsuccessful but reached daughter Helyn App who states she is on the way. Updated patient and [facility] staff.

## 2016-01-03 NOTE — Progress Notes (Signed)
At 18:30, patient came back to the hospital to pick up the upper dentures that she left in her room.  Jillyn Ledger, MBA, BSN, RN

## 2016-01-03 NOTE — Progress Notes (Signed)
Patient discharge teaching given, including activity, diet, follow-up appoints, and medications. Patient verbalized understanding of all discharge instructions. IV access was d/c'd. Vitals are stable. Skin is intact except as charted in most recent assessments. Pt to be escorted out by NT, to be driven home by family.  Janson Lamar, MBA, BSN, RN 

## 2016-01-03 NOTE — Care Management (Signed)
CM discussed [facility] services with daughter Helyn App, daughter agreeable transitional plan, referral faxed to [employer]

## 2016-01-03 NOTE — Progress Notes (Signed)
Pharmacy Antibiotic Note  Jennifer Lawson is a 55 y.o. female admitted on 01/01/2016 with pneumonia. Patient is an HD patient (T, Th, Sat). Pharmacy has been consulted for levofloxacin PO dosing. Vancomycin/Cefepime have no been discontinued.  Plan: -Levofloxacin 250 mg PO q48h  Height: 5\' 2"  (157.5 cm) Weight: 163 lb 9.3 oz (74.2 kg) IBW/kg (Calculated) : 50.1Follow up HD schedule.  Temp (24hrs), Avg:98.3 F (36.8 C), Min:98 F (36.7 C), Max:98.6 F (37 C)   Recent Labs Lab 01/01/16 1100 01/02/16 0554 01/03/16 0511  WBC 15.3* 12.3* 8.4  CREATININE 4.32* 5.36*  --     Estimated Creatinine Clearance: 11 mL/min (by C-G formula based on SCr of 5.36 mg/dL).    Allergies  Allergen Reactions  . Codeine Sulfate Anaphylaxis    Daughter called about having this allergy   . Gabapentin Other (See Comments) and Anaphylaxis    seizures  . Haldol [Haloperidol Lactate] Shortness Of Breath and Other (See Comments)    hallucinations  . Risperidone And Related Shortness Of Breath and Other (See Comments)    hallucinations  . Trazodone And Nefazodone Other (See Comments)    Makes pt lose balance and fall  . Invega [Paliperidone Er] Nausea And Vomiting  . Vistaril [Hydroxyzine Hcl] Nausea And Vomiting  . Hydrocodone-Acetaminophen Itching    Not allergic to aceetaminophen    Antimicrobials this admission: 8/25 Vancomycin >> 8/27 8/25 Cefepime >> 8/27 8/27 Levofloxacin >>   Microbiology results: 8/25 BCx: ngtd  Thank you for allowing pharmacy to be a part of this patient's care.  Arrie Senate, PharmD PGY-1 Pharmacy Resident Pager: [phone] 01/03/2016

## 2016-01-03 NOTE — Progress Notes (Signed)
Patient reported that, "someone threw away her top dentures.  I wrapped them up in a washcloth and left them on the sink.  They disappeared on 01-02-2016."  Dentures were not on list of patient items, when nurse inventoried items.  Message left for office of patient experience.  Jillyn Ledger, MBA, BSN, RN

## 2016-01-03 NOTE — Discharge Instructions (Signed)
Follow with Primary MD [facility] in 3 days   Get CBC, CMP, 2 view Chest X ray checked  by Primary MD or SNF MD in 5-7 days ( we routinely change or add medications that can affect your baseline labs and fluid status, therefore we recommend that you get the mentioned basic workup next visit with your PCP, your PCP may decide not to get them or add new tests based on their clinical decision)   Activity: As tolerated with Full fall precautions use walker/cane & assistance as needed   Disposition Home     Diet:   Renal Check your Weight same time everyday, if you gain over 2 pounds, or you develop in leg swelling, experience more shortness of breath or chest pain, call your Primary MD immediately. Follow Cardiac Low Salt Diet and 1.2 lit/day fluid restriction.   On your next visit with your primary care physician please Get Medicines reviewed and adjusted.   Please request your Prim.MD to go over all Hospital Tests and Procedure/Radiological results at the follow up, please get all Hospital records sent to your Prim MD by signing hospital release before you go home.   If you experience worsening of your admission symptoms, develop shortness of breath, life threatening emergency, suicidal or homicidal thoughts you must seek medical attention immediately by calling 911 or calling your MD immediately  if symptoms less severe.  You Must read complete instructions/literature along with all the possible adverse reactions/side effects for all the Medicines you take and that have been prescribed to you. Take any new Medicines after you have completely understood and accpet all the possible adverse reactions/side effects.   Do not drive, operate heavy machinery, perform activities at heights, swimming or participation in water activities or provide baby sitting services if your were admitted for syncope or siezures until you have seen by Primary MD or a Neurologist and advised to do so again.  Do  not drive when taking Pain medications.    Do not take more than prescribed Pain, Sleep and Anxiety Medications  Special Instructions: If you have smoked or chewed Tobacco  in the last 2 yrs please stop smoking, stop any regular Alcohol  and or any Recreational drug use.  Wear Seat belts while driving.   Please note  You were cared for by a hospitalist during your hospital stay. If you have any questions about your discharge medications or the care you received while you were in the hospital after you are discharged, you can call the unit and asked to speak with the hospitalist on call if the hospitalist that took care of you is not available. Once you are discharged, your primary care physician will handle any further medical issues. Please note that NO REFILLS for any discharge medications will be authorized once you are discharged, as it is imperative that you return to your primary care physician (or establish a relationship with a primary care physician if you do not have one) for your aftercare needs so that they can reassess your need for medications and monitor your lab values.

## 2016-01-03 NOTE — Progress Notes (Signed)
Top dentures found in patient's room.  Will keep at charge nurse desk.  Notified patient's daughter.  Will pick up today.  Jillyn Ledger, MBA, BSN, RN

## 2016-01-03 NOTE — Discharge Summary (Signed)
Jennifer Lawson [id number] DOB: [birth year] DOA: 01/01/2016  PCP: [facility]  Admit date: 01/01/2016  Discharge date: 01/03/2016  Admitted From: Home   Disposition:  Home   Recommendations for Outpatient Follow-up:   Follow up with PCP in 1-2 weeks  PCP Please obtain BMP/CBC, 2 view CXR in 1week,  (see Discharge instructions)   PCP Please follow up on the following pending results: Follow final blood culture and sputum culture results   Home Health: None   Equipment/Devices: None  Consultations: Renal Discharge Condition: Stable   CODE STATUS: Full   Diet Recommendation: Renal   Chief Complaint  Patient presents with  . Chest Pain  . Cough     Brief history of present illness from the day of admission and additional interim summary    55 year old female history of excessive behaviors and noncompliant with hemodialysis. Patient reportedly suffering with multiple medical problems including bipolar disorder, aggressive behavior, end-stage renal diseaseon HD T Th S, COPD, depression, anemia of chronic disease, diabetes, hypertension, PTSD, GERD, recently admitted and discharged on 8/11 after being treated for acute psychosis, due to noncompliance the Geodon.In addition the patient was missing her dialysis appointments, was found to be angry, lab, agitated with tangential speech. Patient was evaluated by psychiatry during her last admission.clonazepam, Sinequan and Depakote were increased. Patient was subsequently discharged to home, due to unavailability of inpatient psych unit, due to dependency on dialysis.  Patient presents today with chest pain and cough. She does have underlying COPD and asthma, shortness of breath has been worsening for the last 3 days. Patient wanted to be 91% on room air,  chest x-ray consistent with right lower lobe pneumonia. Patient otherwise hemodynamically stable except for a low-grade fever of 99.0. White blood cell count 15.3 up from 3.0. Initial troponin 0.02.    Hospital issues addressed    1. Pneumonia. Patient was recently admitted and discharged, also has ESRD requiring routine dialysis, much improved on empiric IV antibiotics Completely symptom free this morning sitting in the chair reading Bible, wants to go home, culture so far negative, will be placed on Levaquin for 5 more doses and discharged home, request PCP to check final culture results next visit and to repeat CBC, 2 view chest x-ray in 5-7 days.  2. COPD & Asthma. Stable no wheezing. Supportive care as above.  3. ESRD. On Tuesday, Thursday, dialysis, missed last 2 sessions prior to admission, nephrology on board will be dialyzed as needed. Counseled on compliance.  4. Essential hypertension. Stable on beta blocker.  5. Dyslipidemia - Statin  6. AOCD - stable, has history of recent upper GI bleed due to esophagitis and gastric ulcer caused by NSAIDs. Will be monitored. He is currently on PPI twice a day. Follows with Dr. Michail Sermon of [facility] requested to follow with him within 1-2 weeks.    Discharge diagnosis     Principal Problem:   HCAP (healthcare-associated pneumonia) Active Problems:   HTN (hypertension)   COPD (chronic obstructive pulmonary disease) ([place])  CKD (chronic kidney disease) stage V requiring chronic dialysis (HCC)   Anemia of renal disease   Bipolar affective disorder, current episode manic with psychotic symptoms ([place])   Dyslipidemia   Asthma    Discharge instructions    Discharge Instructions    Discharge instructions    Complete by:  As directed   Follow with Primary MD [facility] in 3 days   Get CBC, CMP, 2 view Chest X ray checked  by Primary MD or SNF MD in 5-7 days ( we routinely change or add medications that can affect your  baseline labs and fluid status, therefore we recommend that you get the mentioned basic workup next visit with your PCP, your PCP may decide not to get them or add new tests based on their clinical decision)   Activity: As tolerated with Full fall precautions use walker/cane & assistance as needed   Disposition Home     Diet:   Renal Check your Weight same time everyday, if you gain over 2 pounds, or you develop in leg swelling, experience more shortness of breath or chest pain, call your Primary MD immediately. Follow Cardiac Low Salt Diet and 1.2 lit/day fluid restriction.   On your next visit with your primary care physician please Get Medicines reviewed and adjusted.   Please request your Prim.MD to go over all Hospital Tests and Procedure/Radiological results at the follow up, please get all Hospital records sent to your Prim MD by signing hospital release before you go home.   If you experience worsening of your admission symptoms, develop shortness of breath, life threatening emergency, suicidal or homicidal thoughts you must seek medical attention immediately by calling 911 or calling your MD immediately  if symptoms less severe.  You Must read complete instructions/literature along with all the possible adverse reactions/side effects for all the Medicines you take and that have been prescribed to you. Take any new Medicines after you have completely understood and accpet all the possible adverse reactions/side effects.   Do not drive, operate heavy machinery, perform activities at heights, swimming or participation in water activities or provide baby sitting services if your were admitted for syncope or siezures until you have seen by Primary MD or a Neurologist and advised to do so again.  Do not drive when taking Pain medications.    Do not take more than prescribed Pain, Sleep and Anxiety Medications  Special Instructions: If you have smoked or chewed Tobacco  in the last 2  yrs please stop smoking, stop any regular Alcohol  and or any Recreational drug use.  Wear Seat belts while driving.   Please note  You were cared for by a hospitalist during your hospital stay. If you have any questions about your discharge medications or the care you received while you were in the hospital after you are discharged, you can call the unit and asked to speak with the hospitalist on call if the hospitalist that took care of you is not available. Once you are discharged, your primary care physician will handle any further medical issues. Please note that NO REFILLS for any discharge medications will be authorized once you are discharged, as it is imperative that you return to your primary care physician (or establish a relationship with a primary care physician if you do not have one) for your aftercare needs so that they can reassess your need for medications and monitor your lab values.   Increase activity slowly    Complete  by:  As directed      Discharge Medications     Medication List    STOP taking these medications   furosemide 40 MG tablet Commonly known as:  LASIX     TAKE these medications   albuterol (2.5 MG/3ML) 0.083% nebulizer solution Commonly known as:  PROVENTIL Take 2.5 mg by nebulization every 6 (six) hours as needed for wheezing.   albuterol 108 (90 Base) MCG/ACT inhaler Commonly known as:  PROVENTIL HFA;VENTOLIN HFA Inhale 2 puffs into the lungs every 6 (six) hours as needed for wheezing or shortness of breath.   b complex-vitamin c-folic acid 0.8 MG Tabs tablet Take 1 tablet by mouth at bedtime.   B-complex with vitamin C tablet Take 1 tablet by mouth daily.   cinacalcet 60 MG tablet Commonly known as:  SENSIPAR Take 1 tablet (60 mg total) by mouth daily. Takes with largest meal once daily What changed:  additional instructions   Darbepoetin Alfa 40 MCG/0.4ML Sosy injection Commonly known as:  ARANESP Inject 0.4 mLs (40 mcg total) into  the vein every Saturday with hemodialysis.   divalproex 500 MG 24 hr tablet Commonly known as:  DEPAKOTE ER Take 2 tablets (1,000 mg total) by mouth 2 (two) times daily.   divalproex 250 MG 24 hr tablet Commonly known as:  DEPAKOTE ER Take 750 mg by mouth daily.   doxepin 50 MG capsule Commonly known as:  SINEQUAN Take 1 capsule (50 mg total) by mouth at bedtime. What changed:  when to take this  reasons to take this   DSS 100 MG Caps Take 100 mg by mouth 2 (two) times daily.   levofloxacin 250 MG tablet Commonly known as:  LEVAQUIN Take 1 tablet (250 mg total) by mouth every other day.   LORazepam 0.5 MG tablet Commonly known as:  ATIVAN Take 1 tablet (0.5 mg total) by mouth 3 (three) times daily.   metoprolol tartrate 25 MG tablet Commonly known as:  LOPRESSOR Take 0.5 tablets (12.5 mg total) by mouth 2 (two) times daily.   mometasone-formoterol 100-5 MCG/ACT Aero Commonly known as:  DULERA Inhale 2 puffs into the lungs 2 (two) times daily.   nicotine 21 mg/24hr patch Commonly known as:  NICODERM CQ - dosed in mg/24 hours Place 1 patch (21 mg total) onto the skin daily.   ondansetron 4 MG disintegrating tablet Commonly known as:  ZOFRAN-ODT Take 4 mg by mouth every 6 (six) hours as needed for nausea or vomiting.   pantoprazole 40 MG tablet Commonly known as:  PROTONIX Take 1 tablet (40 mg total) by mouth 2 (two) times daily.   polyethylene glycol packet Commonly known as:  MIRALAX / GLYCOLAX Take 17 g by mouth daily.   sennosides-docusate sodium 8.6-50 MG tablet Commonly known as:  SENOKOT-S Take 2 tablets by mouth 2 (two) times daily.   sevelamer carbonate 800 MG tablet Commonly known as:  RENVELA Take 2 tablets (1,600 mg total) by mouth 3 (three) times daily with meals. Takes 2 tabs with each meal and snacks as directed What changed:  additional instructions   simvastatin 10 MG tablet Commonly known as:  ZOCOR Take 1 tablet (10 mg total) by mouth  at bedtime. Reported on 05/30/2015   ziprasidone 60 MG capsule Commonly known as:  GEODON Take 1 capsule (60 mg total) by mouth 2 (two) times daily with a meal.       Follow-up Information    [facility]. Schedule an appointment as soon  as possible for a visit in 2 day(s).   Specialty:  Internal Medicine Contact information: [address] [place] [phone]           Major procedures and Radiology Reports - PLEASE review detailed and final reports thoroughly  -        Dg Chest 2 View  Result Date: 01/01/2016 CLINICAL DATA:  Chest pain. EXAM: CHEST  2 VIEW COMPARISON:  Radiographs of November 24, 2015. FINDINGS: Stable cardiomegaly. No pneumothorax or significant pleural effusion is noted. Left lung is clear. New right lower lobe opacity is noted most consistent with pneumonia. The visualized skeletal structures are unremarkable. IMPRESSION: Right lower lobe pneumonia. Follow-up PA and lateral radiographs of the chest in 3-4 weeks after antibiotic therapy trial is recommended to ensure resolution and rule out underlying neoplasm. Electronically Signed   By: Marijo Conception, M.D.   On: 01/01/2016 13:10    Micro Results    Recent Results (from the past 240 hour(s))  Culture, blood (routine x 2) Call MD if unable to obtain prior to antibiotics being given     Status: None (Preliminary result)   Collection Time: 01/01/16  6:39 PM  Result Value Ref Range Status   Specimen Description BLOOD BLOOD LEFT FOREARM  Final   Special Requests IN PEDIATRIC BOTTLE 4CC  Final   Culture NO GROWTH < 24 HOURS  Final   Report Status PENDING  Incomplete  Culture, blood (routine x 2)     Status: None (Preliminary result)   Collection Time: 01/01/16  6:49 PM  Result Value Ref Range Status   Specimen Description BLOOD BLOOD LEFT HAND  Final   Special Requests IN PEDIATRIC BOTTLE 3CC  Final   Culture NO GROWTH < 24 HOURS  Final   Report Status PENDING  Incomplete     Today   Subjective    Jennifer Lawson today has no headache,no chest abdominal pain,no new weakness tingling or numbness, feels much better wants to go home today.    Objective   Blood pressure 125/71, pulse 95, temperature 98 F (36.7 C), temperature source Oral, resp. rate 18, height 5\' 2"  (1.575 m), weight 74.2 kg (163 lb 9.3 oz), last menstrual period 05/09/2008, SpO2 95 %.   Intake/Output Summary (Last 24 hours) at 01/03/16 0938 Last data filed at 01/03/16 0600  Gross per 24 hour  Intake              890 ml  Output             2176 ml  Net            -1286 ml    Exam Awake Alert, Oriented x 3, No new F.N deficits, Normal affect [place].AT,PERRAL Supple Neck,No JVD, No cervical lymphadenopathy appriciated.  Symmetrical Chest wall movement, Good air movement bilaterally, CTAB RRR,No Gallops,Rubs or new Murmurs, No Parasternal Heave +ve B.Sounds, Abd Soft, Non tender, No organomegaly appriciated, No rebound -guarding or rigidity. No Cyanosis, Clubbing or edema, No new Rash or bruise   Data Review   CBC w Diff: Lab Results  Component Value Date   WBC 8.4 01/03/2016   HGB 9.0 (L) 01/03/2016   HCT 31.0 (L) 01/03/2016   PLT 177 01/03/2016   LYMPHOPCT 8 01/01/2016   BANDSPCT 0 01/01/2016   MONOPCT 17 01/01/2016   EOSPCT 0 01/01/2016   BASOPCT 0 01/01/2016    CMP: Lab Results  Component Value Date   NA 134 (L) 01/02/2016  K 4.1 01/02/2016   CL 96 (L) 01/02/2016   CO2 24 01/02/2016   BUN 44 (H) 01/02/2016   CREATININE 5.36 (H) 01/02/2016   PROT 6.5 01/02/2016   ALBUMIN 2.8 (L) 01/02/2016   BILITOT 0.6 01/02/2016   ALKPHOS 90 01/02/2016   AST 17 01/02/2016   ALT 10 (L) 01/02/2016  .   Total Time in preparing paper work, data evaluation and todays exam - 35 minutes  Thurnell Lose M.D on 01/03/2016 at 9:38 AM  [facility]   Office  [phone]

## 2016-01-03 NOTE — Progress Notes (Signed)
[facility] Progress Note  Assessment/Plan: 1.  RLL PNA - WBC 12.3 Maxepime and Vanc per primary- blood cx  - ng to date 2.  ESRD -  TTS K 4.1-changed to 3 K bath for the remainder of treament; running full 4 hours - kinetics are marginal at 3.5 hours 3.  Hypertension/volume  - HD yesterday post weight 74.2kg - has not been getting to EDW with trace LE edema Net UF 2186mL   BP 111/76 post treatment  4. Anemia  - hgb 9 - Max ESA due next week - continue weekly Fe 5. Metabolic bone disease -  Hectorol/renvela/sensipar 6. Nutrition - alb 2.8 changed to renal diet with fluid restriction- add renavites 7. tobacco addiction - wearing patch - needs to continue after d/c 8. Bipolar d/o - baseline - per primary  Lynnda Child, PA-C [place] 01/03/2016,10:29 AM  LOS: 2 days   Pt seen, examined and agree w A/P as above.  Kelly Splinter MD [facility] pager [phone]    cell [phone] 01/03/2016, 11:49 AM    Subjective:  Alert and pleasant. Talkative not focused. No specific complaints. Tolerated HD well yesterday. For discharge today.   Objective Vitals:   01/02/16 2127 01/03/16 0100 01/03/16 0626 01/03/16 0924  BP:   133/79 125/71  Pulse:   90 95  Resp:   16 18  Temp:   98.1 F (36.7 C) 98 F (36.7 C)  TempSrc:   Oral Oral  SpO2: 96%  97% 95%  Weight:  74.2 kg (163 lb 9.3 oz)    Height:  5\' 2"  (1.575 m)     Physical Exam General: WN female Alert NAD Heart: RRR S1 S2 Lungs: Breathing unlabored on room air.  Fine crackles at bases -improved since admission Abdomen: soft NT ND Extremities: trace bilat LE edema Dialysis Access: RUE AVF   Dialysis Orders: TTS [facility] 4 hr 2 K 2 Ca profile 2 hectorol 6 EDW 73.5 (74.4 post HD 8/24 with net UF 5.6 L)  venofer 50 per week mircera 225 - last dose 8/15  Additional Objective Labs: Basic Metabolic Panel:  Recent Labs Lab 01/01/16 1100 01/02/16 0554  NA 132* 134*  K 4.0 4.1  CL  97* 96*  CO2 24 24  GLUCOSE 80 111*  BUN 34* 44*  CREATININE 4.32* 5.36*  CALCIUM 8.9 8.5*   Liver Function Tests:  Recent Labs Lab 01/02/16 0554  AST 17  ALT 10*  ALKPHOS 90  BILITOT 0.6  PROT 6.5  ALBUMIN 2.8*   No results for input(s): LIPASE, AMYLASE in the last 168 hours. CBC:  Recent Labs Lab 01/01/16 1100 01/02/16 0554 01/03/16 0511  WBC 15.3* 12.3* 8.4  NEUTROABS 11.5*  --   --   HGB 9.7* 9.0* 9.0*  HCT 32.3* 30.2* 31.0*  MCV 100.6* 100.3* 101.3*  PLT 170 172 177   Blood Culture    Component Value Date/Time   SDES BLOOD BLOOD LEFT HAND 01/01/2016 1849   SPECREQUEST IN PEDIATRIC BOTTLE 3CC 01/01/2016 1849   CULT NO GROWTH < 24 HOURS 01/01/2016 1849   REPTSTATUS PENDING 01/01/2016 1849    Cardiac Enzymes:  Recent Labs Lab 01/01/16 1839 01/01/16 2044 01/02/16 0554  TROPONINI 0.07* 0.04* <0.03   CBG:  Recent Labs Lab 01/03/16 0631  GLUCAP 107*   Iron Studies: No results for input(s): IRON, TIBC, TRANSFERRIN, FERRITIN in the last 72 hours. Lab Results  Component Value Date   INR 1.04 07/11/2015  INR 1.15 05/13/2014   INR 1.07 11/10/2012   Studies/Results: Dg Chest 2 View  Result Date: 01/01/2016 CLINICAL DATA:  Chest pain. EXAM: CHEST  2 VIEW COMPARISON:  Radiographs of November 24, 2015. FINDINGS: Stable cardiomegaly. No pneumothorax or significant pleural effusion is noted. Left lung is clear. New right lower lobe opacity is noted most consistent with pneumonia. The visualized skeletal structures are unremarkable. IMPRESSION: Right lower lobe pneumonia. Follow-up PA and lateral radiographs of the chest in 3-4 weeks after antibiotic therapy trial is recommended to ensure resolution and rule out underlying neoplasm. Electronically Signed   By: Marijo Conception, M.D.   On: 01/01/2016 13:10   Medications:   . cinacalcet  60 mg Oral Q breakfast  . [START ON 01/05/2016] darbepoetin (ARANESP) injection - DIALYSIS  200 mcg Intravenous Q Tue-HD  .  divalproex  750 mg Oral Daily  . docusate sodium  100 mg Oral BID  . doxepin  50 mg Oral QHS  . doxercalciferol  6 mcg Intravenous Q T,Th,Sa-HD  . [START ON 01/05/2016] ferric gluconate (FERRLECIT/NULECIT) IV  62.5 mg Intravenous Q Tue-HD  . heparin subcutaneous  5,000 Units Subcutaneous Q8H  . levofloxacin  250 mg Oral Q48H  . LORazepam  0.5 mg Oral TID  . metoprolol tartrate  12.5 mg Oral BID  . mometasone-formoterol  2 puff Inhalation BID  . multivitamin  1 tablet Oral QHS  . nicotine  21 mg Transdermal Daily  . pantoprazole  40 mg Oral BID  . polyethylene glycol  17 g Oral Daily  . senna-docusate  2 tablet Oral BID  . sevelamer carbonate  1,600 mg Oral TID WC  . simvastatin  10 mg Oral QHS  . sodium chloride flush  3 mL Intravenous Q12H  . sodium chloride flush  3 mL Intravenous Q12H  . ziprasidone  60 mg Oral BID WC

## 2016-01-04 ENCOUNTER — Emergency Department (HOSPITAL_COMMUNITY)
Admission: EM | Admit: 2016-01-04 | Discharge: 2016-01-06 | Disposition: A | Discharge status: Other Acute Inpatient Hospital | Payer: Medicare Other | Attending: Emergency Medicine | Admitting: Emergency Medicine

## 2016-01-04 ENCOUNTER — Encounter (HOSPITAL_COMMUNITY): Payer: Self-pay | Admitting: Emergency Medicine

## 2016-01-04 DIAGNOSIS — E1122 Type 2 diabetes mellitus with diabetic chronic kidney disease: Secondary | ICD-10-CM | POA: Insufficient documentation

## 2016-01-04 DIAGNOSIS — F312 Bipolar disorder, current episode manic severe with psychotic features: Secondary | ICD-10-CM | POA: Diagnosis present

## 2016-01-04 DIAGNOSIS — J45909 Unspecified asthma, uncomplicated: Secondary | ICD-10-CM | POA: Insufficient documentation

## 2016-01-04 DIAGNOSIS — F29 Unspecified psychosis not due to a substance or known physiological condition: Secondary | ICD-10-CM | POA: Insufficient documentation

## 2016-01-04 DIAGNOSIS — J449 Chronic obstructive pulmonary disease, unspecified: Secondary | ICD-10-CM | POA: Insufficient documentation

## 2016-01-04 DIAGNOSIS — Z79899 Other long term (current) drug therapy: Secondary | ICD-10-CM | POA: Diagnosis not present

## 2016-01-04 DIAGNOSIS — N185 Chronic kidney disease, stage 5: Secondary | ICD-10-CM | POA: Diagnosis not present

## 2016-01-04 DIAGNOSIS — F918 Other conduct disorders: Secondary | ICD-10-CM | POA: Diagnosis present

## 2016-01-04 DIAGNOSIS — I120 Hypertensive chronic kidney disease with stage 5 chronic kidney disease or end stage renal disease: Secondary | ICD-10-CM | POA: Insufficient documentation

## 2016-01-04 DIAGNOSIS — F17210 Nicotine dependence, cigarettes, uncomplicated: Secondary | ICD-10-CM | POA: Insufficient documentation

## 2016-01-04 LAB — COMPREHENSIVE METABOLIC PANEL
ALT: 10 U/L — ABNORMAL LOW (ref 14–54)
AST: 19 U/L (ref 15–41)
Albumin: 3.5 g/dL (ref 3.5–5.0)
Alkaline Phosphatase: 95 U/L (ref 38–126)
Anion gap: 13 (ref 5–15)
BUN: 56 mg/dL — ABNORMAL HIGH (ref 6–20)
CO2: 26 mmol/L (ref 22–32)
Calcium: 9.3 mg/dL (ref 8.9–10.3)
Chloride: 99 mmol/L — ABNORMAL LOW (ref 101–111)
Creatinine, Ser: 5.48 mg/dL — ABNORMAL HIGH (ref 0.44–1.00)
GFR calc Af Amer: 9 mL/min — ABNORMAL LOW (ref >60)
GFR calc non Af Amer: 8 mL/min — ABNORMAL LOW (ref >60)
Glucose, Bld: 70 mg/dL (ref 65–99)
Potassium: 5.3 mmol/L — ABNORMAL HIGH (ref 3.5–5.1)
Sodium: 138 mmol/L (ref 135–145)
Total Bilirubin: 0.8 mg/dL (ref 0.3–1.2)
Total Protein: 7.8 g/dL (ref 6.5–8.1)

## 2016-01-04 LAB — CBC
HCT: 32.7 % — ABNORMAL LOW (ref 36.0–46.0)
Hemoglobin: 10 g/dL — ABNORMAL LOW (ref 12.0–15.0)
MCH: 30.3 pg (ref 26.0–34.0)
MCHC: 30.6 g/dL (ref 30.0–36.0)
MCV: 99.1 fL (ref 78.0–100.0)
Platelets: 271 10*3/uL (ref 150–400)
RBC: 3.3 MIL/uL — ABNORMAL LOW (ref 3.87–5.11)
RDW: 19.2 % — ABNORMAL HIGH (ref 11.5–15.5)
WBC: 8.4 10*3/uL (ref 4.0–10.5)

## 2016-01-04 LAB — ACETAMINOPHEN LEVEL: Acetaminophen (Tylenol), Serum: <10 ug/mL — ABNORMAL LOW (ref 10–30)

## 2016-01-04 LAB — SALICYLATE LEVEL: Salicylate Lvl: <4 mg/dL (ref 2.8–30.0)

## 2016-01-04 LAB — ETHANOL: Alcohol, Ethyl (B): <5 mg/dL (ref <5)

## 2016-01-04 MED ORDER — SENNOSIDES-DOCUSATE SODIUM 8.6-50 MG PO TABS
2.0000 | ORAL_TABLET | Freq: Two times a day (BID) | ORAL | Status: DC
  Administered 2016-01-06: 2 via ORAL
  Filled 2016-01-04 (×2): qty 2

## 2016-01-04 MED ORDER — B COMPLEX-C PO TABS
1.0000 | ORAL_TABLET | Freq: Every day | ORAL | Status: DC
  Administered 2016-01-06: 1 via ORAL
  Filled 2016-01-04 (×2): qty 1

## 2016-01-04 MED ORDER — IBUPROFEN 200 MG PO TABS: 600.0000 mg | ORAL_TABLET | Freq: Three times a day (TID) | ORAL | Status: DC | PRN

## 2016-01-04 MED ORDER — DIVALPROEX SODIUM ER 500 MG PO TB24
1000.0000 mg | ORAL_TABLET | Freq: Two times a day (BID) | ORAL | Status: DC
  Administered 2016-01-05 – 2016-01-06 (×3): 1000 mg via ORAL
  Filled 2016-01-04 (×3): qty 2

## 2016-01-04 MED ORDER — DOCUSATE SODIUM 100 MG PO CAPS
100.0000 mg | ORAL_CAPSULE | Freq: Two times a day (BID) | ORAL | Status: DC
  Administered 2016-01-05 – 2016-01-06 (×2): 100 mg via ORAL
  Filled 2016-01-04 (×2): qty 1

## 2016-01-04 MED ORDER — ACETAMINOPHEN 325 MG PO TABS
650.0000 mg | ORAL_TABLET | ORAL | Status: DC | PRN
  Administered 2016-01-06: 650 mg via ORAL
  Filled 2016-01-04 (×2): qty 2

## 2016-01-04 MED ORDER — STERILE WATER FOR INJECTION IJ SOLN
INTRAMUSCULAR | Status: AC
Start: 2016-01-04 — End: 2016-01-04
  Administered 2016-01-04: 18:00:00
  Filled 2016-01-04: qty 10

## 2016-01-04 MED ORDER — ZIPRASIDONE MESYLATE 20 MG IM SOLR
20.0000 mg | Freq: Once | INTRAMUSCULAR | Status: AC
  Administered 2016-01-04: 20 mg via INTRAMUSCULAR
  Filled 2016-01-04: qty 20

## 2016-01-04 MED ORDER — PANTOPRAZOLE SODIUM 40 MG PO TBEC
40.0000 mg | DELAYED_RELEASE_TABLET | Freq: Two times a day (BID) | ORAL | Status: DC
  Administered 2016-01-05 – 2016-01-06 (×2): 40 mg via ORAL
  Filled 2016-01-04 (×3): qty 1

## 2016-01-04 MED ORDER — POLYETHYLENE GLYCOL 3350 17 G PO PACK
17.0000 g | PACK | Freq: Every day | ORAL | Status: DC
  Administered 2016-01-06: 17 g via ORAL
  Filled 2016-01-04 (×2): qty 1

## 2016-01-04 MED ORDER — ALBUTEROL SULFATE HFA 108 (90 BASE) MCG/ACT IN AERS: 2.0000 | INHALATION_SPRAY | Freq: Four times a day (QID) | RESPIRATORY_TRACT | Status: DC | PRN

## 2016-01-04 MED ORDER — LORAZEPAM 0.5 MG PO TABS
0.5000 mg | ORAL_TABLET | Freq: Three times a day (TID) | ORAL | Status: DC
  Administered 2016-01-05: 0.5 mg via ORAL
  Filled 2016-01-04: qty 1

## 2016-01-04 MED ORDER — METOPROLOL TARTRATE 25 MG PO TABS
12.5000 mg | ORAL_TABLET | Freq: Two times a day (BID) | ORAL | Status: DC
  Administered 2016-01-05 – 2016-01-06 (×3): 12.5 mg via ORAL
  Filled 2016-01-04 (×3): qty 1

## 2016-01-04 MED ORDER — CINACALCET HCL 30 MG PO TABS
60.0000 mg | ORAL_TABLET | Freq: Every day | ORAL | Status: DC
  Filled 2016-01-04 (×2): qty 2

## 2016-01-04 MED ORDER — ZIPRASIDONE HCL 20 MG PO CAPS
60.0000 mg | ORAL_CAPSULE | Freq: Two times a day (BID) | ORAL | Status: DC
  Administered 2016-01-05: 60 mg via ORAL
  Filled 2016-01-04: qty 3

## 2016-01-04 MED ORDER — DIPHENHYDRAMINE HCL 50 MG/ML IJ SOLN
50.0000 mg | Freq: Once | INTRAMUSCULAR | Status: AC
  Administered 2016-01-04: 50 mg via INTRAMUSCULAR
  Filled 2016-01-04: qty 1

## 2016-01-04 MED ORDER — ONDANSETRON HCL 4 MG PO TABS: 4.0000 mg | ORAL_TABLET | Freq: Three times a day (TID) | ORAL | Status: DC | PRN

## 2016-01-04 MED ORDER — MOMETASONE FURO-FORMOTEROL FUM 100-5 MCG/ACT IN AERO
2.0000 | INHALATION_SPRAY | Freq: Two times a day (BID) | RESPIRATORY_TRACT | Status: DC
  Administered 2016-01-05 – 2016-01-06 (×2): 2 via RESPIRATORY_TRACT
  Filled 2016-01-04: qty 8.8

## 2016-01-04 MED ORDER — DOXEPIN HCL 50 MG PO CAPS
50.0000 mg | ORAL_CAPSULE | Freq: Every day | ORAL | Status: DC
  Administered 2016-01-05: 50 mg via ORAL
  Filled 2016-01-04 (×2): qty 1

## 2016-01-04 MED ORDER — SEVELAMER CARBONATE 800 MG PO TABS
1600.0000 mg | ORAL_TABLET | Freq: Three times a day (TID) | ORAL | Status: DC
  Administered 2016-01-05 – 2016-01-06 (×3): 1600 mg via ORAL
  Filled 2016-01-04 (×6): qty 2

## 2016-01-04 MED ORDER — ALUM & MAG HYDROXIDE-SIMETH 200-200-20 MG/5ML PO SUSP: 30.0000 mL | ORAL | Status: DC | PRN

## 2016-01-04 MED ORDER — RENA-VITE PO TABS
1.0000 | ORAL_TABLET | Freq: Every day | ORAL | Status: DC
  Administered 2016-01-05: 1 via ORAL
  Filled 2016-01-04 (×2): qty 1

## 2016-01-04 MED ORDER — SIMVASTATIN 10 MG PO TABS
10.0000 mg | ORAL_TABLET | Freq: Every day | ORAL | Status: DC
  Administered 2016-01-05: 10 mg via ORAL
  Filled 2016-01-04 (×2): qty 1

## 2016-01-04 MED ORDER — DIVALPROEX SODIUM ER 500 MG PO TB24: 750.0000 mg | ORAL_TABLET | Freq: Every day | ORAL | Status: DC

## 2016-01-04 MED ORDER — ZOLPIDEM TARTRATE 5 MG PO TABS: 5.0000 mg | ORAL_TABLET | Freq: Every evening | ORAL | Status: DC | PRN

## 2016-01-04 MED ORDER — LORAZEPAM 2 MG/ML IJ SOLN
1.0000 mg | Freq: Once | INTRAMUSCULAR | Status: AC
  Administered 2016-01-04: 1 mg via INTRAMUSCULAR
  Filled 2016-01-04: qty 1

## 2016-01-04 NOTE — Progress Notes (Signed)
Received call from [name] (pt's daughter). [name] was yelling,upset that patient was discharged yesterday and she was not informed of pending discharge or discharge. According to [name], a SW (did not know name) was to call her before patient was discharged. [name] stated "patient was up all night,slamming doors,walking the hall, talking and she had a 56 year old son with autism." Explained to [place] that attempts were made to notify her of discharge but that no one answered so her sister was notified and she came to pick pt up.[name] threatened to call attorney,stating "[facility] does this all the time, they try to send her home when she is crazy, I can't manage her, I can't take her back in my home." Informed [name] that I would have my Dept.Director call her. Tamara,DD attempted to return call without success.

## 2016-01-04 NOTE — ED Notes (Signed)
Not able to stick patient -  She is not cooperative.  RN also advised me  to wait until patient gets some meds that will help calm her down.

## 2016-01-04 NOTE — Progress Notes (Signed)
~   01/04/16 ~10am attempted to return call to [place] at [phone] unable to leave message due to mailbox full.  ~01/04/16 ~1030am received call back from [place]. Indicated that she felt that her mother was having an "episode" stated that she has "not been sleeping","cursing", "being mean to [name]". Stated that she did not understand why the patient was discharged from the hospital when she was in this mental health state. Stated that she has attempted on a number of occasions to have her mother placed outside of the home, and that she has informed Faith that she needs help because her mother is sick. Jennifer Lawson stated that the patient was discharged with call information for the ACT team at [facility], but that nothing has been set up. Stated that she is not able to manage her mother at home, and really does not know what else to do. Jennifer Lawson stated that her sister lives in [place], and that she has no help with her mother at home.   Informed Jennifer Lawson, that DD would need to collaborate with CM/SW to determine what criteria were not met in order for her mother to be d/cd to a SNF on yesterday.  Agreed to call Jennifer Lawson back as soon as information was obtained.  ~ 1100am 01/04/16 Spoke with CM [employer] who indicated that patient at the time of discharge had no skillable need that would qualify her for SNF or LTACH at discharge. Stated that the [facility] are required to be set up by the patient or family.   ~01/04/16 350pm: Returned call to Jennifer Lawson to make aware of the above. Jennifer Lawson indicated that she had called the police on her mother Jennifer Lawson) due to her being unable to manage her at this time.  I was unable to share the information above but provided my contact information in the event that Jennifer Lawson needed  to call me back.  Jennifer Lawson, Jennifer Lawson
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
## 2016-01-04 NOTE — ED Notes (Signed)
Bed: WTR8 Expected date:  Expected time:  Means of arrival:  Comments: 

## 2016-01-05 DIAGNOSIS — F29 Unspecified psychosis not due to a substance or known physiological condition: Secondary | ICD-10-CM | POA: Diagnosis not present

## 2016-01-05 DIAGNOSIS — F312 Bipolar disorder, current episode manic severe with psychotic features: Secondary | ICD-10-CM | POA: Diagnosis not present

## 2016-01-05 MED ORDER — BENZTROPINE MESYLATE 1 MG PO TABS
0.5000 mg | ORAL_TABLET | Freq: Two times a day (BID) | ORAL | Status: DC
  Administered 2016-01-05 – 2016-01-06 (×3): 0.5 mg via ORAL
  Filled 2016-01-05 (×3): qty 1

## 2016-01-05 MED ORDER — OLANZAPINE 10 MG IM SOLR
10.0000 mg | Freq: Three times a day (TID) | INTRAMUSCULAR | Status: DC
  Administered 2016-01-05: 10 mg via INTRAMUSCULAR
  Filled 2016-01-05 (×2): qty 10

## 2016-01-05 MED ORDER — ZIPRASIDONE MESYLATE 20 MG IM SOLR: 20.0000 mg | Freq: Once | INTRAMUSCULAR | Status: DC

## 2016-01-05 MED ORDER — LORAZEPAM 2 MG/ML IJ SOLN
2.0000 mg | Freq: Once | INTRAMUSCULAR | Status: AC
  Administered 2016-01-05: 2 mg via INTRAMUSCULAR
  Filled 2016-01-05: qty 1

## 2016-01-05 MED ORDER — DIPHENHYDRAMINE HCL 50 MG/ML IJ SOLN
50.0000 mg | Freq: Once | INTRAMUSCULAR | Status: AC
  Administered 2016-01-05: 50 mg via INTRAMUSCULAR
  Filled 2016-01-05: qty 1

## 2016-01-05 MED ORDER — CHLORPROMAZINE HCL 25 MG PO TABS: 50.0000 mg | ORAL_TABLET | Freq: Three times a day (TID) | ORAL | Status: DC | PRN

## 2016-01-05 MED ORDER — BENZTROPINE MESYLATE 1 MG PO TABS: 0.5000 mg | ORAL_TABLET | Freq: Two times a day (BID) | ORAL | Status: DC

## 2016-01-05 NOTE — ED Provider Notes (Signed)
[facility]-EMERGENCY DEPT Provider Note   CSN: [id number] Arrival date & time: 01/04/16  1727     History   Chief Complaint Chief Complaint  Patient presents with  . IVC    HPI Jennifer Lawson is a 55 y.o. female.  HPI Patient presents to the emergency department with hallucinations and aggressive behavior.  The patient is under IVC commitment by her family for these episodes.  The patient will not give me any history.  She is actively hallucinating, when I speak with her Past Medical History:  Diagnosis Date  . Anemia of renal disease   . Asthma   . Bipolar 1 disorder ([facility])   . Chronic kidney disease    06/11/13- not on dialysis  . Complication of anesthesia    difficulty going to sleep  . COPD (chronic obstructive pulmonary disease) ([facility])   . Depression   . Diabetes mellitus without complication ([facility])    denies  . GERD (gastroesophageal reflux disease)   . Heart murmur   . History of blood transfusion   . Hypertension   . Mental disorder   . Overdose   . PTSD (post-traumatic stress disorder)   . Seizures ([facility])    "passsed out"  . Shortness of breath    lying down flat  . Tobacco use disorder 11/13/2012    Patient Active Problem List   Diagnosis Date Noted  . HCAP (healthcare-associated pneumonia) 01/01/2016  . Psychosis 12/14/2015  . Acute blood loss anemia 11/25/2015  . GI bleed 11/24/2015  . Benign essential HTN 11/24/2015  . Physical deconditioning   . Asthma 08/06/2015  . Cerebrovascular disease 07/23/2015  . Dyslipidemia 07/14/2015  . Bipolar affective disorder, current episode manic with psychotic symptoms ([facility]) 07/13/2015  . Anemia of renal disease 01/26/2015  . CKD (chronic kidney disease) stage V requiring chronic dialysis ([facility]) 05/13/2014  . COPD (chronic obstructive pulmonary disease) ([facility]) 09/26/2013  . Hyperparathyroidism, primary ([facility]) 02/20/2013  . Tobacco use disorder 11/13/2012  . HTN (hypertension) 02/20/2012    Past Surgical History:    Procedure Laterality Date  . AV FISTULA PLACEMENT Right 06/12/2013   Procedure: ARTERIOVENOUS (AV) FISTULA CREATION; RIGHT  BASILIC VEIN TRANSPOSITION with Intraoperative ultrasound;  Surgeon: Pryor Ochoa, MD;  Location: [facility] OR;  Service: Vascular;  Laterality: Right;  . COLONOSCOPY N/A 11/30/2015   Procedure: COLONOSCOPY;  Surgeon: Charlott Rakes, MD;  Location: [facility] ENDOSCOPY;  Service: Endoscopy;  Laterality: N/A;  . ESOPHAGOGASTRODUODENOSCOPY Left 11/14/2012   Procedure: ESOPHAGOGASTRODUODENOSCOPY (EGD);  Surgeon: Charna Elizabeth, MD;  [facility] ENDOSCOPY;  Service: Endoscopy;  Laterality: Left;  . ESOPHAGOGASTRODUODENOSCOPY N/A 11/30/2015   Procedure: ESOPHAGOGASTRODUODENOSCOPY (EGD);  Surgeon: Charlott Rakes, MD;  Location: [facility] ENDOSCOPY;  Service: Endoscopy;  Laterality: N/A;  . JOINT REPLACEMENT Right    knee  . PARATHYROIDECTOMY    . Right knee replacement     she says it was last year.    OB History    No data available       Home Medications    Prior to Admission medications   Medication Sig Start Date End Date Taking? Authorizing Provider  divalproex (DEPAKOTE ER) 500 MG 24 hr tablet Take 2 tablets (1,000 mg total) by mouth 2 (two) times daily. 12/18/15  Yes Maryann Mikhail, DO  doxepin (SINEQUAN) 50 MG capsule Take 1 capsule (50 mg total) by mouth at bedtime. Patient taking differently: Take 50 mg by mouth at bedtime as needed (sleep).  12/18/15  Yes Maryann Mikhail, DO  levofloxacin [facility])  250 MG tablet Take 1 tablet (250 mg total) by mouth every other day. 01/03/16  Yes Thurnell Lose, MD  LORazepam (ATIVAN) 0.5 MG tablet Take 1 tablet (0.5 mg total) by mouth 3 (three) times daily. 12/18/15  Yes Maryann Mikhail, DO  metoprolol tartrate (LOPRESSOR) 25 MG tablet Take 0.5 tablets (12.5 mg total) by mouth 2 (two) times daily. 08/09/15  Yes Barton Dubois, MD  mometasone-formoterol [facility]) 100-5 MCG/ACT AERO Inhale 2 puffs into the lungs 2 (two) times daily. 06/08/15  Yes  Jolanta B Pucilowska, MD  simvastatin (ZOCOR) 10 MG tablet Take 1 tablet (10 mg total) by mouth at bedtime. Reported on 05/30/2015 07/23/15  Yes Hildred Priest, MD  albuterol (PROVENTIL HFA;VENTOLIN HFA) 108 (90 Base) MCG/ACT inhaler Inhale 2 puffs into the lungs every 6 (six) hours as needed for wheezing or shortness of breath. 06/08/15   Clovis Fredrickson, MD  albuterol (PROVENTIL) (2.5 MG/3ML) 0.083% nebulizer solution Take 2.5 mg by nebulization every 6 (six) hours as needed for wheezing.    Historical Provider, MD  B Complex-C (B-COMPLEX WITH VITAMIN C) tablet Take 1 tablet by mouth daily.    Historical Provider, MD  b complex-vitamin c-folic acid (NEPHRO-VITE) 0.8 MG TABS tablet Take 1 tablet by mouth at bedtime. 07/23/15   Hildred Priest, MD  cinacalcet (SENSIPAR) 60 MG tablet Take 1 tablet (60 mg total) by mouth daily. Takes with largest meal once daily Patient taking differently: Take 60 mg by mouth daily.  07/23/15   Hildred Priest, MD  Darbepoetin Alfa (ARANESP) 40 MCG/0.4ML SOSY injection Inject 0.4 mLs (40 mcg total) into the vein every Saturday with hemodialysis. 08/09/15   Barton Dubois, MD  Docusate Sodium (DSS) 100 MG CAPS Take 100 mg by mouth 2 (two) times daily. 06/08/15   Clovis Fredrickson, MD  furosemide (LASIX) 40 MG tablet Take 60 mg by mouth 2 (two) times daily. 11/19/15   Historical Provider, MD  hydrOXYzine (ATARAX/VISTARIL) 25 MG tablet Take 25 mg by mouth 2 (two) times daily as needed for anxiety.    Historical Provider, MD  Melatonin 5 MG TABS Take 5 mg by mouth at bedtime.    Historical Provider, MD  nicotine (NICODERM CQ - DOSED IN MG/24 HOURS) 21 mg/24hr patch Place 1 patch (21 mg total) onto the skin daily. 12/18/15   Maryann Mikhail, DO  ondansetron (ZOFRAN-ODT) 4 MG disintegrating tablet Take 4 mg by mouth every 6 (six) hours as needed for nausea or vomiting.    Historical Provider, MD  pantoprazole (PROTONIX) 40 MG tablet Take 1 tablet  (40 mg total) by mouth 2 (two) times daily. 12/01/15   Theodis Blaze, MD  polyethylene glycol (MIRALAX / Floria Raveling) packet Take 17 g by mouth daily.    Historical Provider, MD  sennosides-docusate sodium (SENOKOT-S) 8.6-50 MG tablet Take 2 tablets by mouth 2 (two) times daily.    Historical Provider, MD  sevelamer carbonate (RENVELA) 800 MG tablet Take 2 tablets (1,600 mg total) by mouth 3 (three) times daily with meals. Takes 2 tabs with each meal and snacks as directed Patient taking differently: Take 1,600 mg by mouth 3 (three) times daily with meals.  07/23/15   Hildred Priest, MD  ziprasidone (GEODON) 60 MG capsule Take 1 capsule (60 mg total) by mouth 2 (two) times daily with a meal. 12/05/15   Isla Pence, MD    Family History Family History  Problem Relation Age of Onset  . Diabetes Mother   . Hyperlipidemia Mother   .  Hypertension Mother   . Diabetes Father   . Hypertension Father   . Hyperlipidemia Father     Social History Social History  Substance Use Topics  . Smoking status: Current Every Day Smoker    Packs/day: 2.00    Years: 40.00    Types: Cigarettes, Cigars  . Smokeless tobacco: Never Used  . Alcohol use Yes     Comment: "I like a cold beer every once in a while"      Allergies   Codeine sulfate; Gabapentin; Haldol [haloperidol lactate]; Risperidone and related; Trazodone and nefazodone; Invega [paliperidone er]; Vistaril [hydroxyzine hcl]; and Hydrocodone-acetaminophen   Review of Systems Review of Systems Level V caveat applies due to psychosis  Physical Exam Updated Vital Signs BP 169/89 (BP Location: Left Arm)   Pulse 68   Temp 98.9 F (37.2 C)   Resp 18   LMP 05/09/2008   SpO2 92%   Physical Exam  Constitutional: She appears well-developed and well-nourished.  HENT:  Head: Normocephalic and atraumatic.  Eyes: Pupils are equal, round, and reactive to light.  Pulmonary/Chest: Effort normal and breath sounds normal. No respiratory  distress.  Musculoskeletal: She exhibits no edema.  Neurological: She is alert.  Skin: Skin is warm and dry.  Psychiatric: Her affect is angry and labile. Her speech is rapid and/or pressured. She is agitated, aggressive, hyperactive and actively hallucinating. Thought content is delusional. She is inattentive.     ED Treatments / Results  Labs (all labs ordered are listed, but only abnormal results are displayed) Labs Reviewed  COMPREHENSIVE METABOLIC PANEL - Abnormal; Notable for the following:       Result Value   Potassium 5.3 (*)    Chloride 99 (*)    BUN 56 (*)    Creatinine, Ser 5.48 (*)    ALT 10 (*)    GFR calc non Af Amer 8 (*)    GFR calc Af Amer 9 (*)    All other components within normal limits  ACETAMINOPHEN LEVEL - Abnormal; Notable for the following:    Acetaminophen (Tylenol), Serum <10 (*)    All other components within normal limits  CBC - Abnormal; Notable for the following:    RBC 3.30 (*)    Hemoglobin 10.0 (*)    HCT 32.7 (*)    RDW 19.2 (*)    All other components within normal limits  ETHANOL  SALICYLATE LEVEL  URINE RAPID DRUG SCREEN, HOSP PERFORMED    EKG  EKG Interpretation None       Radiology No results found.  Procedures Procedures (including critical care time)  Medications Ordered in ED Medications  acetaminophen (TYLENOL) tablet 650 mg (not administered)  ibuprofen (ADVIL,MOTRIN) tablet 600 mg (not administered)  zolpidem (AMBIEN) tablet 5 mg (not administered)  ondansetron (ZOFRAN) tablet 4 mg (not administered)  alum & mag hydroxide-simeth (MAALOX/MYLANTA) 200-200-20 MG/5ML suspension 30 mL (not administered)  albuterol (PROVENTIL HFA;VENTOLIN HFA) 108 (90 Base) MCG/ACT inhaler 2 puff (not administered)  B-complex with vitamin C tablet 1 tablet (not administered)  multivitamin (RENA-VIT) tablet 1 tablet (1 tablet Oral Not Given 01/04/16 2325)  cinacalcet (SENSIPAR) tablet 60 mg (not administered)  divalproex (DEPAKOTE ER)  24 hr tablet 1,000 mg (1,000 mg Oral Not Given 01/04/16 2323)  docusate sodium (COLACE) capsule 100 mg (100 mg Oral Not Given 01/04/16 2323)  LORazepam (ATIVAN) tablet 0.5 mg (0.5 mg Oral Not Given 01/04/16 2301)  doxepin (SINEQUAN) capsule 50 mg (50 mg Oral Not Given 01/04/16 2324)  metoprolol tartrate (LOPRESSOR) tablet 12.5 mg (12.5 mg Oral Not Given 01/04/16 2324)  mometasone-formoterol (DULERA) 100-5 MCG/ACT inhaler 2 puff (2 puffs Inhalation Not Given 01/04/16 2200)  pantoprazole (PROTONIX) EC tablet 40 mg (40 mg Oral Not Given 01/04/16 2325)  polyethylene glycol (MIRALAX / GLYCOLAX) packet 17 g (not administered)  senna-docusate (Senokot-S) tablet 2 tablet (2 tablets Oral Not Given 01/04/16 2325)  sevelamer carbonate (RENVELA) tablet 1,600 mg (not administered)  simvastatin (ZOCOR) tablet 10 mg (10 mg Oral Not Given 01/04/16 2325)  ziprasidone (GEODON) capsule 60 mg (not administered)  ziprasidone (GEODON) injection 20 mg (20 mg Intramuscular Given 01/04/16 1828)  sterile water (preservative free) injection (  Given 01/04/16 1828)  LORazepam (ATIVAN) injection 1 mg (1 mg Intramuscular Given 01/04/16 2109)  diphenhydrAMINE (BENADRYL) injection 50 mg (50 mg Intramuscular Given 01/04/16 2109)     Initial Impression / Assessment and Plan / ED Course  I have reviewed the triage vital signs and the nursing notes.  Pertinent labs & imaging results that were available during my care of the patient were reviewed by me and considered in my medical decision making (see chart for details).  Clinical Course    Patient needs TTS assessment and further evaluation of her hallucinations and psychosis  Final Clinical Impressions(s) / ED Diagnoses   Final diagnoses:  None    New Prescriptions New Prescriptions   No medications on file     Dalia Heading, PA-C 01/05/16 [place] Duo Liu, MD 01/05/16 [phone]
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
## 2016-01-05 NOTE — BH Assessment (Signed)
[facility] Assessment Progress Note  Per Corena Pilgrim, MD, this pt requires psychiatric hospitalization at this time.  The following facilities have been contacted to seek placement for this pt, with results as noted:  Beds available, information sent, decision pending:  [place]:  [facility] [place], [place] Triage Specialist [phone]

## 2016-01-05 NOTE — ED Notes (Signed)
Pt is awake.  She is still very irritable.  She ambulated to bathroom without difficulty.  She is refusing meds and vital signs.

## 2016-01-05 NOTE — ED Notes (Signed)
Pt went in bathroom ambulatory as she has been all morning  After shutting door she cried out "Help"  We found her sitting beside toilet with her pants down.  When we asked her what happened, she exclaimed "What the fuck you think happened"   We helped her up and walked her to her room.  She was walking the same as earlier,  The EDP was called and assessed her and she did not complain of pain to him.  She was instructed to call for help when she had to get up.  She was assisted to bed and we put all of her necessities within her reach.

## 2016-01-05 NOTE — ED Notes (Signed)
This patient is out of control this morning.  She has pooped all over the toilet.  She is banging on walls and the telephone.  She is verbally aggressive and using inappropriate language.  She is being very crude toward staff and patients.  She was medicated but her behavior continues to be horrible.
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
## 2016-01-05 NOTE — Progress Notes (Signed)
01/05/16 1406:  Pt was drowsy.  LRT introduced self.  Pt was talking to someone who wasn't there asking if they wanted any of her food.  Pt handed LRT her bible to read a passage to her.  Pt was unable to stay woke and was nodding off while eating her lunch.  Pt was unable to participate in activities.  Victorino Sparrow, LRT/CTRS

## 2016-01-05 NOTE — Progress Notes (Signed)
Wrist restraints removed.  

## 2016-01-06 ENCOUNTER — Inpatient Hospital Stay
Admission: EM | Admit: 2016-01-06 | Discharge: 2016-01-13 | DRG: 885 | Disposition: A | Discharge status: Home / Self Care | Payer: 59 | Source: Intra-hospital | Attending: Psychiatry | Admitting: Psychiatry

## 2016-01-06 ENCOUNTER — Encounter: Payer: Self-pay | Admitting: Psychiatry

## 2016-01-06 DIAGNOSIS — Z9114 Patient's other noncompliance with medication regimen: Secondary | ICD-10-CM

## 2016-01-06 DIAGNOSIS — Z79899 Other long term (current) drug therapy: Secondary | ICD-10-CM | POA: Diagnosis not present

## 2016-01-06 DIAGNOSIS — G4700 Insomnia, unspecified: Secondary | ICD-10-CM | POA: Diagnosis present

## 2016-01-06 DIAGNOSIS — I10 Essential (primary) hypertension: Secondary | ICD-10-CM | POA: Diagnosis present

## 2016-01-06 DIAGNOSIS — E8881 Metabolic syndrome: Secondary | ICD-10-CM | POA: Diagnosis present

## 2016-01-06 DIAGNOSIS — N186 End stage renal disease: Secondary | ICD-10-CM | POA: Diagnosis present

## 2016-01-06 DIAGNOSIS — E1122 Type 2 diabetes mellitus with diabetic chronic kidney disease: Secondary | ICD-10-CM | POA: Diagnosis present

## 2016-01-06 DIAGNOSIS — Z8249 Family history of ischemic heart disease and other diseases of the circulatory system: Secondary | ICD-10-CM

## 2016-01-06 DIAGNOSIS — Z915 Personal history of self-harm: Secondary | ICD-10-CM

## 2016-01-06 DIAGNOSIS — Z888 Allergy status to other drugs, medicaments and biological substances status: Secondary | ICD-10-CM

## 2016-01-06 DIAGNOSIS — K219 Gastro-esophageal reflux disease without esophagitis: Secondary | ICD-10-CM | POA: Diagnosis present

## 2016-01-06 DIAGNOSIS — J45909 Unspecified asthma, uncomplicated: Secondary | ICD-10-CM | POA: Diagnosis present

## 2016-01-06 DIAGNOSIS — Z886 Allergy status to analgesic agent status: Secondary | ICD-10-CM

## 2016-01-06 DIAGNOSIS — I120 Hypertensive chronic kidney disease with stage 5 chronic kidney disease or end stage renal disease: Secondary | ICD-10-CM | POA: Diagnosis present

## 2016-01-06 DIAGNOSIS — Z833 Family history of diabetes mellitus: Secondary | ICD-10-CM

## 2016-01-06 DIAGNOSIS — F17210 Nicotine dependence, cigarettes, uncomplicated: Secondary | ICD-10-CM | POA: Diagnosis present

## 2016-01-06 DIAGNOSIS — F17200 Nicotine dependence, unspecified, uncomplicated: Secondary | ICD-10-CM | POA: Diagnosis present

## 2016-01-06 DIAGNOSIS — Z8711 Personal history of peptic ulcer disease: Secondary | ICD-10-CM

## 2016-01-06 DIAGNOSIS — Z992 Dependence on renal dialysis: Secondary | ICD-10-CM | POA: Diagnosis not present

## 2016-01-06 DIAGNOSIS — E785 Hyperlipidemia, unspecified: Secondary | ICD-10-CM | POA: Diagnosis present

## 2016-01-06 DIAGNOSIS — K5900 Constipation, unspecified: Secondary | ICD-10-CM | POA: Diagnosis present

## 2016-01-06 DIAGNOSIS — Z96651 Presence of right artificial knee joint: Secondary | ICD-10-CM | POA: Diagnosis present

## 2016-01-06 DIAGNOSIS — Z9109 Other allergy status, other than to drugs and biological substances: Secondary | ICD-10-CM | POA: Diagnosis not present

## 2016-01-06 DIAGNOSIS — E210 Primary hyperparathyroidism: Secondary | ICD-10-CM | POA: Diagnosis present

## 2016-01-06 DIAGNOSIS — F29 Unspecified psychosis not due to a substance or known physiological condition: Secondary | ICD-10-CM | POA: Diagnosis not present

## 2016-01-06 DIAGNOSIS — F312 Bipolar disorder, current episode manic severe with psychotic features: Secondary | ICD-10-CM | POA: Diagnosis present

## 2016-01-06 DIAGNOSIS — G8929 Other chronic pain: Secondary | ICD-10-CM | POA: Diagnosis present

## 2016-01-06 DIAGNOSIS — N189 Chronic kidney disease, unspecified: Secondary | ICD-10-CM | POA: Diagnosis present

## 2016-01-06 DIAGNOSIS — J449 Chronic obstructive pulmonary disease, unspecified: Secondary | ICD-10-CM | POA: Diagnosis present

## 2016-01-06 DIAGNOSIS — D631 Anemia in chronic kidney disease: Secondary | ICD-10-CM | POA: Diagnosis present

## 2016-01-06 LAB — BASIC METABOLIC PANEL
Anion gap: 12 (ref 5–15)
BUN: 72 mg/dL — ABNORMAL HIGH (ref 6–20)
CO2: 24 mmol/L (ref 22–32)
Calcium: 8.7 mg/dL — ABNORMAL LOW (ref 8.9–10.3)
Chloride: 97 mmol/L — ABNORMAL LOW (ref 101–111)
Creatinine, Ser: 6.09 mg/dL — ABNORMAL HIGH (ref 0.44–1.00)
GFR calc Af Amer: 8 mL/min — ABNORMAL LOW (ref >60)
GFR calc non Af Amer: 7 mL/min — ABNORMAL LOW (ref >60)
Glucose, Bld: 114 mg/dL — ABNORMAL HIGH (ref 65–99)
Potassium: 5.4 mmol/L — ABNORMAL HIGH (ref 3.5–5.1)
Sodium: 133 mmol/L — ABNORMAL LOW (ref 135–145)

## 2016-01-06 LAB — CULTURE, BLOOD (ROUTINE X 2)
Culture: NO GROWTH
Culture: NO GROWTH

## 2016-01-06 LAB — HEMOGLOBIN A1C
Hgb A1c MFr Bld: 4.4 % — ABNORMAL LOW (ref 4.8–5.6)
Mean Plasma Glucose: 80 mg/dL

## 2016-01-06 LAB — CBC
HCT: 28.9 % — ABNORMAL LOW (ref 36.0–46.0)
Hemoglobin: 9 g/dL — ABNORMAL LOW (ref 12.0–15.0)
MCH: 30.8 pg (ref 26.0–34.0)
MCHC: 31.1 g/dL (ref 30.0–36.0)
MCV: 99 fL (ref 78.0–100.0)
Platelets: ADEQUATE 10*3/uL (ref 150–400)
RBC: 2.92 MIL/uL — ABNORMAL LOW (ref 3.87–5.11)
RDW: 18.8 % — ABNORMAL HIGH (ref 11.5–15.5)
WBC: 5.7 10*3/uL (ref 4.0–10.5)

## 2016-01-06 MED ORDER — B COMPLEX-C PO TABS
1.0000 | ORAL_TABLET | Freq: Every day | ORAL | Status: DC
  Filled 2016-01-06: qty 1

## 2016-01-06 MED ORDER — SEVELAMER CARBONATE 800 MG PO TABS
1600.0000 mg | ORAL_TABLET | Freq: Three times a day (TID) | ORAL | Status: DC
  Administered 2016-01-07 – 2016-01-13 (×18): 1600 mg via ORAL
  Filled 2016-01-06 (×17): qty 2

## 2016-01-06 MED ORDER — IBUPROFEN 600 MG PO TABS
600.0000 mg | ORAL_TABLET | Freq: Three times a day (TID) | ORAL | Status: DC | PRN
  Administered 2016-01-10 – 2016-01-12 (×5): 600 mg via ORAL
  Filled 2016-01-06 (×7): qty 1

## 2016-01-06 MED ORDER — ZOLPIDEM TARTRATE 5 MG PO TABS: 5.0000 mg | ORAL_TABLET | Freq: Every evening | ORAL | Status: DC | PRN

## 2016-01-06 MED ORDER — DOXEPIN HCL 50 MG PO CAPS
50.0000 mg | ORAL_CAPSULE | Freq: Every day | ORAL | Status: DC
  Administered 2016-01-06 – 2016-01-12 (×7): 50 mg via ORAL
  Filled 2016-01-06 (×8): qty 1

## 2016-01-06 MED ORDER — METOPROLOL TARTRATE 25 MG PO TABS
12.5000 mg | ORAL_TABLET | Freq: Two times a day (BID) | ORAL | Status: DC
  Administered 2016-01-06 – 2016-01-13 (×13): 12.5 mg via ORAL
  Filled 2016-01-06 (×13): qty 1

## 2016-01-06 MED ORDER — PANTOPRAZOLE SODIUM 40 MG PO TBEC
40.0000 mg | DELAYED_RELEASE_TABLET | Freq: Two times a day (BID) | ORAL | Status: DC
  Administered 2016-01-06 – 2016-01-13 (×14): 40 mg via ORAL
  Filled 2016-01-06 (×14): qty 1

## 2016-01-06 MED ORDER — DIVALPROEX SODIUM ER 500 MG PO TB24
1000.0000 mg | ORAL_TABLET | Freq: Two times a day (BID) | ORAL | Status: DC
  Administered 2016-01-06 – 2016-01-13 (×14): 1000 mg via ORAL
  Filled 2016-01-06 (×14): qty 2

## 2016-01-06 MED ORDER — ALUM & MAG HYDROXIDE-SIMETH 200-200-20 MG/5ML PO SUSP: 30.0000 mL | ORAL | Status: DC | PRN

## 2016-01-06 MED ORDER — RENA-VITE PO TABS
1.0000 | ORAL_TABLET | Freq: Every day | ORAL | Status: DC
  Administered 2016-01-06 – 2016-01-12 (×7): 1 via ORAL
  Filled 2016-01-06 (×8): qty 1

## 2016-01-06 MED ORDER — SIMVASTATIN 20 MG PO TABS
10.0000 mg | ORAL_TABLET | Freq: Every day | ORAL | Status: DC
  Administered 2016-01-06 – 2016-01-12 (×6): 10 mg via ORAL
  Filled 2016-01-06 (×4): qty 1
  Filled 2016-01-06 (×3): qty 2

## 2016-01-06 MED ORDER — ACETAMINOPHEN 325 MG PO TABS: 650.0000 mg | ORAL_TABLET | ORAL | Status: DC | PRN

## 2016-01-06 MED ORDER — ZIPRASIDONE HCL 40 MG PO CAPS
40.0000 mg | ORAL_CAPSULE | Freq: Two times a day (BID) | ORAL | Status: DC
  Administered 2016-01-07 – 2016-01-13 (×13): 40 mg via ORAL
  Filled 2016-01-06 (×13): qty 1

## 2016-01-06 MED ORDER — DOCUSATE SODIUM 100 MG PO CAPS
100.0000 mg | ORAL_CAPSULE | Freq: Two times a day (BID) | ORAL | Status: DC
  Administered 2016-01-06 – 2016-01-13 (×13): 100 mg via ORAL
  Filled 2016-01-06 (×13): qty 1

## 2016-01-06 MED ORDER — SENNOSIDES-DOCUSATE SODIUM 8.6-50 MG PO TABS
2.0000 | ORAL_TABLET | Freq: Two times a day (BID) | ORAL | Status: DC
  Administered 2016-01-06 – 2016-01-13 (×13): 2 via ORAL
  Filled 2016-01-06 (×13): qty 2

## 2016-01-06 MED ORDER — POLYETHYLENE GLYCOL 3350 17 G PO PACK
17.0000 g | PACK | Freq: Every day | ORAL | Status: DC
  Administered 2016-01-07 – 2016-01-13 (×6): 17 g via ORAL
  Filled 2016-01-06 (×7): qty 1

## 2016-01-06 MED ORDER — ONDANSETRON HCL 4 MG PO TABS: 4.0000 mg | ORAL_TABLET | Freq: Three times a day (TID) | ORAL | Status: DC | PRN

## 2016-01-06 MED ORDER — LORAZEPAM 2 MG PO TABS
2.0000 mg | ORAL_TABLET | Freq: Four times a day (QID) | ORAL | Status: DC | PRN
  Administered 2016-01-07 – 2016-01-13 (×14): 2 mg via ORAL
  Filled 2016-01-06 (×14): qty 1

## 2016-01-06 MED ORDER — ALBUTEROL SULFATE HFA 108 (90 BASE) MCG/ACT IN AERS
2.0000 | INHALATION_SPRAY | Freq: Four times a day (QID) | RESPIRATORY_TRACT | Status: DC | PRN
  Administered 2016-01-10 – 2016-01-11 (×2): 2 via RESPIRATORY_TRACT
  Filled 2016-01-06: qty 6.7

## 2016-01-06 MED ORDER — BENZTROPINE MESYLATE 1 MG PO TABS
0.5000 mg | ORAL_TABLET | Freq: Two times a day (BID) | ORAL | Status: DC
  Filled 2016-01-06: qty 1

## 2016-01-06 MED ORDER — MAGNESIUM HYDROXIDE 400 MG/5ML PO SUSP: 30.0000 mL | Freq: Every day | ORAL | Status: DC | PRN

## 2016-01-06 MED ORDER — ACETAMINOPHEN 325 MG PO TABS: 650.0000 mg | ORAL_TABLET | Freq: Four times a day (QID) | ORAL | Status: DC | PRN

## 2016-01-06 MED ORDER — CINACALCET HCL 30 MG PO TABS
60.0000 mg | ORAL_TABLET | Freq: Every day | ORAL | Status: DC
  Administered 2016-01-06 – 2016-01-12 (×7): 60 mg via ORAL
  Filled 2016-01-06 (×8): qty 2

## 2016-01-06 MED ORDER — MOMETASONE FURO-FORMOTEROL FUM 100-5 MCG/ACT IN AERO
2.0000 | INHALATION_SPRAY | Freq: Two times a day (BID) | RESPIRATORY_TRACT | Status: DC
  Administered 2016-01-06 – 2016-01-13 (×14): 2 via RESPIRATORY_TRACT
  Filled 2016-01-06 (×2): qty 8.8

## 2016-01-06 NOTE — ED Notes (Addendum)
Consulted EDP in regards to lab results, reports pt does not need acute dialysis at this time. No new orders. Will continue to monitor.

## 2016-01-06 NOTE — BHH Suicide Risk Assessment (Deleted)
[facility] Admission Suicide Risk Assessment   Nursing information obtained from:    Demographic factors:    Current Mental Status:    Loss Factors:    Historical Factors:    Risk Reduction Factors:     Total Time spent with patient: 1 hour Principal Problem: Bipolar affective disorder, current episode manic with psychotic symptoms ([place]) Diagnosis:   Patient Active Problem List   Diagnosis Date Noted  . Bipolar affective disorder, manic, severe, with psychotic behavior ([place]) [F31.2] 01/06/2016  . HCAP (healthcare-associated pneumonia) [J18.9] 01/01/2016  . Acute blood loss anemia [D62] 11/25/2015  . GI bleed [K92.2] 11/24/2015  . Benign essential HTN [I10] 11/24/2015  . Physical deconditioning [R53.81]   . Asthma [J45.909] 08/06/2015  . Cerebrovascular disease [I67.9] 07/23/2015  . Dyslipidemia [E78.5] 07/14/2015  . Bipolar affective disorder, current episode manic with psychotic symptoms ([place]) [F31.2] 07/13/2015  . Anemia of renal disease [D63.1] 01/26/2015  . CKD (chronic kidney disease) stage V requiring chronic dialysis ([place]) [N18.6, Z99.2] 05/13/2014  . COPD (chronic obstructive pulmonary disease) ([place]) [J44.9] 09/26/2013  . Hyperparathyroidism, primary ([place]) [E21.0] 02/20/2013  . Tobacco use disorder [F17.200] 11/13/2012   Subjective Data: bipolar disorder.  Continued Clinical Symptoms:    The "Alcohol Use Disorders Identification Test", Guidelines for Use in Primary Care, Second Edition.  World Pharmacologist [facility]). Score between 0-7:  no or low risk or alcohol related problems. Score between 8-15:  moderate risk of alcohol related problems. Score between 16-19:  high risk of alcohol related problems. Score 20 or above:  warrants further diagnostic evaluation for alcohol dependence and treatment.   CLINICAL FACTORS:   Bipolar Disorder:   Mixed State   Musculoskeletal: Strength & Muscle Tone: within normal limits Gait & Station: unsteady Patient leans:  N/A  Psychiatric Specialty Exam: Physical Exam  Nursing note and vitals reviewed.   Review of Systems  Neurological: Positive for weakness.  Psychiatric/Behavioral: Positive for hallucinations.  All other systems reviewed and are negative.   Blood pressure 120/74, pulse 74, temperature 99 F (37.2 C), temperature source Oral, resp. rate 18, last menstrual period 05/09/2008.There is no height or weight on file to calculate BMI.  General Appearance: Casual  Eye Contact:  Good  Speech:  Clear and Coherent  Volume:  Normal  Mood:  Dysphoric  Affect:  Blunt  Thought Process:  Goal Directed  Orientation:  Full (Time, Place, and Person)  Thought Content:  WDL  Suicidal Thoughts:  No  Homicidal Thoughts:  No  Memory:  Immediate;   Fair Recent;   Fair Remote;   Fair  Judgement:  Poor  Insight:  Lacking  Psychomotor Activity:  Decreased  Concentration:  Concentration: Fair and Attention Span: Fair  Recall:  [employer] of Knowledge:  Fair  Language:  Fair  Akathisia:  No  Handed:  Right  AIMS (if indicated):     Assets:  Communication Skills Desire for Improvement Financial Resources/Insurance Housing Resilience Social Support  ADL's:  Intact  Cognition:  WNL  Sleep:         COGNITIVE FEATURES THAT CONTRIBUTE TO RISK:  None    SUICIDE RISK:   Moderate:  Frequent suicidal ideation with limited intensity, and duration, some specificity in terms of plans, no associated intent, good self-control, limited dysphoria/symptomatology, some risk factors present, and identifiable protective factors, including available and accessible social support.   PLAN OF CARE: hospital admission, medication management, dialysis, discharge planning.  Ms. Chelton is a 55 year old female with history of  bipolar disorder and end-stage kidney disease on dialysis admitted for a mixed episode,  aggressive behavior  and poor treatment compliance.   1. Bipolar mania. We continued Depakote and  Geodon for psychosis and mood stabilization. VPA 64.  2. Kidney disease. Nephrology consult is greatly appreciated. The patient will continue dialysis.  3. COPD. We continued inhalers, Albuterol and Dulera.  4. Hypertension. She is on metoprolol.   5. GERD. She is on Protonix.  6. Smoking. Nicotine patch is available.  7. Insomnia. We continued Doxepin.   8. Metabolic syndrome. Lipid panel, HgbA1C, TSH and PRL were done during prevoius admission.  9. Dyslipidemia. She is on Zocor.   10. Constipation. She is on Colace.   11. Chronic pain. The patient abuses all types of over-the-counter pain medication.  12. EKG. Normal sinus rhythm. QT 397.  13. Disposition. She will be discharged to home with her daughter. She will follow up with Dr. Rosine Door for medication management and her nephrologist for dialysis.   I certify that inpatient services furnished can reasonably be expected to improve the patient's condition.  Orson Slick, MD 01/06/2016, 8:46 PM

## 2016-01-06 NOTE — ED Notes (Signed)
Patient noted in room. No complaints, stable, in no acute distress. Q15 minute rounds and monitoring via [employer] to continue.  

## 2016-01-06 NOTE — BH Assessment (Signed)
Re-assessment:   Writer met with patient to complete a reassessment. Upon entering room patient immediately became angry and reluctant to answer questions . Sts to this Probation officer, "What the hell do you want....lier...your nothing but a damn lie". Patient sts, "I don't feel good and if you guys don't get me my dialysis I'm going to die". Patient denies SI. She refused to answer any further questions related to HI and AVH's. Patient requesting to discharge home and continued using inappropriate language. Patient sts that she tired and wants to rest. As this Probation officer was speaking with patient the phlebotomist entered the room to take a blood sample. Patient was initially uncooperative but once staff explained the reason for a blood sample she became cooperative. Patient is dressed in scrubs. She appears restless. Thought process is irrelevant and tangential. Judgement and insight appear to be poor. Concentration is poor.

## 2016-01-06 NOTE — Progress Notes (Signed)
Spoke with Jennifer Lawson about the call from [place] about pt possible need of dialysis.  Please referr to Dr Venora Maples EDP notes Pt not presently in need of emergency dialysis or admission for dialysis and Venora Maples will speak with [facility] providers if needed

## 2016-01-06 NOTE — BH Assessment (Addendum)
[place] Assessment Progress Note  Ria Comment, RN, [facility], reports that last night pt was accepted to [place], by Dr Bary Leriche, with an understanding that the bed would be available at 08:30 today.  However, because of pt's need for dialysis, this has been postponed.  She has asked the physician covering today to review pt.  A final decision is pending as of this writing.  Jalene Mullet, MA Triage Specialist [phone]    Addendum:  At 13:20 Ria Comment reports that pt has now been accepted to [facility] to Rm 324A by Dr Bary Leriche.  Serena Colonel, NP, concurs with this decision.  Pt is under IVC and is to be transported via [facility].  Pt's nurse, Caryl Pina, has been notified, and agrees to call report to [phone].  IVC documents have been faxed to [phone].  Jalene Mullet, [place] Triage Specialist [phone]

## 2016-01-06 NOTE — Progress Notes (Signed)
ED CM updated TTS, [place] to have [facility] MD and NP/PA updated

## 2016-01-06 NOTE — ED Provider Notes (Signed)
Medical screening examination/treatment/procedure(s) were conducted as a shared visit with non-physician practitioner(s) and myself.  I personally evaluated the patient during the encounter.   EKG Interpretation None       10:49 AM Patient is a dialysis patient dialyzes Tuesday Thursday Saturday.  Her last dialysis was Saturday.  From a volume standpoint she does not need it emergently at this time.  We will redraw laboratory studies today to check her potassium.  If her potassium is fine I think she can [facility] where she can have routine dialysis performed at that time by the team.  I discussed this briefly with my on-call nephrologist Dr. Florene Glen who agrees with this management.  If her potassium is elevated she will need to be admitted the hospital for dialysis at [facility] and placed to an inpatient psychiatric unit from there.   Jola Schmidt, MD 01/06/16 1050

## 2016-01-06 NOTE — ED Notes (Signed)
Pt requesting wheelchair to assist with ambulation to sheriff car, despite ambulating independently throughout the shift. Pt off unit with sheriff via wheelchair. Accepting nurse Silva Bandy, RN at [facility] notified.

## 2016-01-06 NOTE — Tx Team (Signed)
Initial Treatment Plan 01/06/2016 7:01 PM ADAM SCHEPPS [id number]    PATIENT STRESSORS: Health problems Marital or family conflict   PATIENT STRENGTHS: Average or above average intelligence Supportive family/friends   PATIENT IDENTIFIED PROBLEMS: "mania"                     DISCHARGE CRITERIA:  Adequate post-discharge living arrangements Improved stabilization in mood, thinking, and/or behavior  PRELIMINARY DISCHARGE PLAN: Return to previous living arrangement  PATIENT/FAMILY INVOLVEMENT: This treatment plan has been presented to and reviewed with the patient, Jennifer Lawson, and/or family member, .  The patient and family have been given the opportunity to ask questions and make suggestions.  Rise Mu, RN 01/06/2016, 7:01 PM

## 2016-01-06 NOTE — ED Notes (Signed)
Patient verbally abusive toward staff.  Patient is intrusive and curses at other patients on the unit.  Patient states, "You only have 5 minutes to talk on the phone Bitch!"  Patient then will later apologize for her actions.  However she repeats the abusive name calling again.

## 2016-01-06 NOTE — ED Notes (Signed)
Sheriff on unit to transfer pt to [facility] per MD order. Personal property given to sheriff for transport.

## 2016-01-06 NOTE — Progress Notes (Signed)
ED CM received a call from [facility] to discuss pt need to have dialysis prior to accepting a possible bed for her at [facility]  Attempt to reach EDP busy at this time Will make another attempt

## 2016-01-06 NOTE — ED Notes (Signed)
EDP, ED charge RN and [facility] consulted r/t pt dialysis . Will continue to monitor.

## 2016-01-06 NOTE — Progress Notes (Signed)
54 year old female received via sheriff dept. Under IVC from [facility],  Patient in wheelchair and scrubs. Verbally aggressive and loud.   Skin assessment and body search performed.  No contraband found.  1+ edema noted to both lower extremities.  Bruise noted to both knees, left big toe, abdomen and arms.

## 2016-01-06 NOTE — Progress Notes (Signed)
01/06/16 1408:  LRT went to pt room for activities but pt was asleep.  Victorino Sparrow, LRT/CTRS

## 2016-01-06 NOTE — ED Notes (Signed)
Attempted to call nurse report, reports a nurse will call back.

## 2016-01-06 NOTE — ED Notes (Signed)
Pt compliant with morning medication regimen. Pt irritable, verbally aggressive at times. Special checks q 15 mins in place for safety. Video monitoring in place.

## 2016-01-06 NOTE — BHH Group Notes (Signed)
[place] Group Notes:  (Nursing/MHT/Case Management/Adjunct)  Date:  01/06/2016  Time:  11:08 PM  Type of Therapy:  Evening Wrap-up Group  Participation Level:  Did Not Attend  Participation Quality:  N/A  Affect:  N/A  Cognitive:  N/A  Insight:  None  Engagement in Group:  Did Not Attend  Modes of Intervention:  Activity and Discussion  Summary of Progress/Problems:  Levonne Spiller 01/06/2016, 11:08 PM

## 2016-01-07 DIAGNOSIS — F312 Bipolar disorder, current episode manic severe with psychotic features: Principal | ICD-10-CM

## 2016-01-07 LAB — PHOSPHORUS: Phosphorus: 6.1 mg/dL — ABNORMAL HIGH (ref 2.5–4.6)

## 2016-01-07 LAB — VALPROIC ACID LEVEL: Valproic Acid Lvl: 64 ug/mL (ref 50.0–100.0)

## 2016-01-07 MED ORDER — EPOETIN ALFA 4000 UNIT/ML IJ SOLN
4000.0000 [IU] | INTRAMUSCULAR | Status: DC
  Administered 2016-01-07 – 2016-01-12 (×3): 4000 [IU] via INTRAVENOUS
  Filled 2016-01-07 (×3): qty 1

## 2016-01-07 MED ORDER — NICOTINE 21 MG/24HR TD PT24
21.0000 mg | MEDICATED_PATCH | Freq: Every day | TRANSDERMAL | Status: DC
  Administered 2016-01-07 – 2016-01-13 (×7): 21 mg via TRANSDERMAL
  Filled 2016-01-07 (×6): qty 1

## 2016-01-07 NOTE — Progress Notes (Signed)
D: Observed pt in wheelchair by nursing station. Patient alert and oriented x4. Patient denies SI/HI/AVH. Pt affect is angry. Pt is very irritable. Pt yells, cusses at, and demeans staff. Pt makes demands, and has extreme difficulty coping with disappointment. Pt is not participating with care, and refuses to answer many questions. Pt c/o of anxiety A: Offered active listening and support. Provided therapeutic communication. Administered scheduled medications. Pt given ativan prn R: Pt needs strong redirection. Pt continues to verbally abuse staff. Pt medication compliant. Will continue Q15 min. checks. Safety maintained.

## 2016-01-07 NOTE — BHH Group Notes (Signed)
[place] Group Notes:  (Nursing/MHT/Case Management/Adjunct)  Date:  01/07/2016  Time:  6:51 PM  Type of Therapy:  Psychoeducational Skills  Participation Level:  Did Not Attend    Primitivo Gauze 01/07/2016, 6:51 PM

## 2016-01-07 NOTE — BHH Group Notes (Signed)
[place] LCSW Group Therapy Note  Type of Therapy and Topic:  Group Therapy:  Goals Group: SMART Goals  Participation Level:  Pt did not attend group. CSW invited pt to group.   Description of Group:   The purpose of a daily goals group is to assist and guide patients in setting recovery/wellness-related goals.  The objective is to set goals as they relate to the crisis in which they were admitted. Patients will be using SMART goal modalities to set measurable goals.  Characteristics of realistic goals will be discussed and patients will be assisted in setting and processing how one will reach their goal. Facilitator will also assist patients in applying interventions and coping skills learned in psycho-education groups to the SMART goal and process how one will achieve defined goal.  Therapeutic Goals: -Patients will develop and document one goal related to or their crisis in which brought them into treatment. -Patients will be guided by LCSW using SMART goal setting modality in how to set a measurable, attainable, realistic and time sensitive goal.  -Patients will process barriers in reaching goal. -Patients will process interventions in how to overcome and successful in reaching goal.   Summary of Patient Progress:  Patient Goal: Pt did not attend group. CSW invited pt to group.    Therapeutic Modalities:   Motivational Interviewing Public relations account executive Therapy Crisis Intervention Model SMART goals setting  Jennifer Lawson G. [place], [facility] 01/07/2016 12:45 PM

## 2016-01-07 NOTE — Progress Notes (Signed)
Subjective:  Patient seen during dialysis Tolerating well    HEMODIALYSIS FLOWSHEET:  Blood Flow Rate (mL/min): 400 mL/min Arterial Pressure (mmHg): -230 mmHg Venous Pressure (mmHg): 150 mmHg Transmembrane Pressure (mmHg): 50 mmHg Ultrafiltration Rate (mL/min): 710 mL/min Dialysate Flow Rate (mL/min): 800 ml/min Conductivity: Machine : 13.7 Conductivity: Machine : 13.7 Dialysis Fluid Bolus: Normal Saline Bolus Amount (mL): 250 mL Dialysate Change: 2K Intra-Hemodialysis Comments: 1914. Resting     Objective:  Vital signs in last 24 hours:  Temp:  [97.2 F (36.2 C)-99 F (37.2 C)] 97.2 F (36.2 C) (08/31 1312) Pulse Rate:  [74-94] 94 (08/31 1630) Resp:  [18-22] 21 (08/31 1630) BP: (97-120)/(47-75) 119/75 (08/31 1630) SpO2:  [93 %-100 %] 93 % (08/31 1630) Weight:  [78.6 kg (173 lb 3.2 oz)] 78.6 kg (173 lb 3.2 oz) (08/31 1312)  Weight change:  Filed Weights   01/07/16 1312  Weight: 78.6 kg (173 lb 3.2 oz)    Intake/Output:    Intake/Output Summary (Last 24 hours) at 01/07/16 1640 Last data filed at 01/07/16 1308  Gross per 24 hour  Intake              720 ml  Output                0 ml  Net              720 ml     Physical Exam: General: NAD resting quietly  HEENT Moist oral mucus membranes  Neck supple  Pulm/lungs Normal breathing effort  CVS/Heart Regular sinus on telemetry  Abdomen:  soft  Extremities: No edema  Neurologic: Resting quietly  Skin: Normal turgor  Access: AVG       Basic Metabolic Panel:   Recent Labs Lab 01/01/16 1100 01/01/16 1839 01/02/16 0554 01/04/16 1943 01/06/16 1042 01/07/16 0726  NA 132*  --  134* 138 133*  --   K 4.0  --  4.1 5.3* 5.4*  --   CL 97*  --  96* 99* 97*  --   CO2 24  --  24 26 24   --   GLUCOSE 80  --  111* 70 114*  --   BUN 34*  --  44* 56* 72*  --   CREATININE 4.32*  --  5.36* 5.48* 6.09*  --   CALCIUM 8.9  --  8.5* 9.3 8.7*  --   MG  --  2.4  --   --   --   --   PHOS  --   --   --   --   --   6.1*     CBC:  Recent Labs Lab 01/01/16 1100 01/02/16 0554 01/03/16 0511 01/04/16 1943 01/06/16 1042  WBC 15.3* 12.3* 8.4 8.4 5.7  NEUTROABS 11.5*  --   --   --   --   HGB 9.7* 9.0* 9.0* 10.0* 9.0*  HCT 32.3* 30.2* 31.0* 32.7* 28.9*  MCV 100.6* 100.3* 101.3* 99.1 99.0  PLT 170 172 177 271 PLATELET CLUMPS NOTED ON SMEAR, COUNT APPEARS ADEQUATE      Microbiology:  Recent Results (from the past 720 hour(s))  Culture, blood (routine x 2) Call MD if unable to obtain prior to antibiotics being given     Status: None   Collection Time: 01/01/16  6:39 PM  Result Value Ref Range Status   Specimen Description BLOOD BLOOD LEFT FOREARM  Final   Special Requests IN PEDIATRIC BOTTLE 4CC  Final   Culture NO GROWTH 5  DAYS  Final   Report Status 01/06/2016 FINAL  Final  Culture, blood (routine x 2)     Status: None   Collection Time: 01/01/16  6:49 PM  Result Value Ref Range Status   Specimen Description BLOOD BLOOD LEFT HAND  Final   Special Requests IN PEDIATRIC BOTTLE 3CC  Final   Culture NO GROWTH 5 DAYS  Final   Report Status 01/06/2016 FINAL  Final    Coagulation Studies: No results for input(s): LABPROT, INR in the last 72 hours.  Urinalysis: No results for input(s): COLORURINE, LABSPEC, PHURINE, GLUCOSEU, HGBUR, BILIRUBINUR, KETONESUR, PROTEINUR, UROBILINOGEN, NITRITE, LEUKOCYTESUR in the last 72 hours.  Invalid input(s): APPERANCEUR    Imaging: No results found.   Medications:     . cinacalcet  60 mg Oral Q supper  . divalproex  1,000 mg Oral BID  . docusate sodium  100 mg Oral BID  . doxepin  50 mg Oral QHS  . epoetin (EPOGEN/PROCRIT) injection  4,000 Units Intravenous Q T,Th,Sa-HD  . metoprolol tartrate  12.5 mg Oral BID  . mometasone-formoterol  2 puff Inhalation BID  . multivitamin  1 tablet Oral QHS  . pantoprazole  40 mg Oral BID  . polyethylene glycol  17 g Oral Daily  . senna-docusate  2 tablet Oral BID  . sevelamer carbonate  1,600 mg Oral TID WC   . simvastatin  10 mg Oral QHS  . ziprasidone  40 mg Oral BID WC   acetaminophen, albuterol, alum & mag hydroxide-simeth, alum & mag hydroxide-simeth, ibuprofen, LORazepam, magnesium hydroxide, ondansetron  Assessment/ Plan:  55 y.o.AA  female with ESRD on HD since 1/16 followed by [facility], COPD, secondary hyperparathyroidism, anemia of CKD, bipolar affective disorder  1. ESRD on HD TTS:  Seen and examined on dialysis.    2. Anemia of CKD: hemoglobin 9.0 Mircera as outpatient.  EPO with HD treatment   3. Secondary hyperparathyroidism of renal origin:  - continue sevelamer for binding.  - cinacalcet 60 mg daily w/meals  4. Hypertension: acceptable  control - metorpolol   LOS: 1 Cason Dabney 8/31/20174:40 PM

## 2016-01-07 NOTE — BHH Suicide Risk Assessment (Signed)
[facility] Admission Suicide Risk Assessment   Nursing information obtained from:    Demographic factors:    Current Mental Status:    Loss Factors:    Historical Factors:    Risk Reduction Factors:     Total Time spent with patient: 1 hour Principal Problem: Bipolar affective disorder, current episode manic with psychotic symptoms ([place]) Diagnosis:   Patient Active Problem List   Diagnosis Date Noted  . Bipolar affective disorder, manic, severe, with psychotic behavior ([place]) [F31.2] 01/06/2016  . HCAP (healthcare-associated pneumonia) [J18.9] 01/01/2016  . Acute blood loss anemia [D62] 11/25/2015  . GI bleed [K92.2] 11/24/2015  . Benign essential HTN [I10] 11/24/2015  . Physical deconditioning [R53.81]   . Asthma [J45.909] 08/06/2015  . Cerebrovascular disease [I67.9] 07/23/2015  . Dyslipidemia [E78.5] 07/14/2015  . Bipolar affective disorder, current episode manic with psychotic symptoms (Jennifer Lawson) [F31.2] 07/13/2015  . Anemia of renal disease [D63.1] 01/26/2015  . CKD (chronic kidney disease) stage V requiring chronic dialysis (Jennifer Lawson) [N18.6, Z99.2] 05/13/2014  . COPD (chronic obstructive pulmonary disease) ([place]) [J44.9] 09/26/2013  . Hyperparathyroidism, primary (Jennifer Lawson) [E21.0] 02/20/2013  . Tobacco use disorder [F17.200] 11/13/2012   Subjective Data: bipolar disorder.  Continued Clinical Symptoms:    The "Alcohol Use Disorders Identification Test", Guidelines for Use in Primary Care, Second Edition.  World Pharmacologist [facility]). Score between 0-7:  no or low risk or alcohol related problems. Score between 8-15:  moderate risk of alcohol related problems. Score between 16-19:  high risk of alcohol related problems. Score 20 or above:  warrants further diagnostic evaluation for alcohol dependence and treatment.   CLINICAL FACTORS:   Bipolar Disorder:   Mixed State   Musculoskeletal: Strength & Muscle Tone: within normal limits Gait & Station: unsteady Patient leans:  N/A  Psychiatric Specialty Exam: Physical Exam  Nursing note and vitals reviewed.   Review of Systems  Neurological: Positive for weakness.  Psychiatric/Behavioral: Positive for hallucinations.  All other systems reviewed and are negative.   Blood pressure 119/65, pulse 95, temperature 97.8 F (36.6 C), temperature source Oral, resp. rate (!) 21, weight 77.1 kg (169 lb 15.6 oz), last menstrual period 05/09/2008, SpO2 92 %.Body mass index is 31.09 kg/m.  General Appearance: Casual  Eye Contact:  Good  Speech:  Clear and Coherent  Volume:  Normal  Mood:  Dysphoric  Affect:  Blunt  Thought Process:  Goal Directed  Orientation:  Full (Time, Place, and Person)  Thought Content:  WDL  Suicidal Thoughts:  No  Homicidal Thoughts:  No  Memory:  Immediate;   Fair Recent;   Fair Remote;   Fair  Judgement:  Poor  Insight:  Lacking  Psychomotor Activity:  Decreased  Concentration:  Concentration: Fair and Attention Span: Fair  Recall:  [employer] of Knowledge:  Fair  Language:  Fair  Akathisia:  No  Handed:  Right  AIMS (if indicated):     Assets:  Communication Skills Desire for Improvement Financial Resources/Insurance Housing Resilience Social Support  ADL's:  Intact  Cognition:  WNL  Sleep:  Number of Hours: 5.5      COGNITIVE FEATURES THAT CONTRIBUTE TO RISK:  None    SUICIDE RISK:   Moderate:  Frequent suicidal ideation with limited intensity, and duration, some specificity in terms of plans, no associated intent, good self-control, limited dysphoria/symptomatology, some risk factors present, and identifiable protective factors, including available and accessible social support.   PLAN OF CARE: hospital admission, medication management, dialysis, discharge planning.  Ms.  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    1. Bipolar mania. We continued Depakote and Geodon for psychosis and mood stabilization. VPA 64.  2. Kidney disease. Nephrology consult is greatly appreciated. The patient will continue dialysis.  3. COPD. We continued inhalers, Albuterol and Dulera.  4. Hypertension. She is on metoprolol.   5. GERD. She is on Protonix.  6. Smoking. Nicotine patch is available.  7. Insomnia. We continued Doxepin.   8. Metabolic syndrome. Lipid panel, HgbA1C, TSH and PRL were done during prevoius admission.  9. Dyslipidemia. She is on Zocor.   10. Constipation. She is on Colace.   11. Chronic pain. The patient abuses all types of over-the-counter pain medication.  12. EKG. Normal sinus rhythm. QT 397.  13. Disposition. She will be discharged to home with her daughter. She will follow up with Dr. Rosine Door for medication management and her nephrologist for dialysis.   I certify that inpatient services furnished can reasonably be expected to improve the patient's condition.  Orson Slick, MD 01/07/2016, 6:31 PM

## 2016-01-07 NOTE — Tx Team (Signed)
Interdisciplinary Treatment and Diagnostic Plan Update  01/07/2016 Time of Session: 10:37 AM  Jennifer Lawson MRN: [id number]  Principal Diagnosis: Bipolar affective disorder, current episode manic with psychotic symptoms ([place])  Secondary Diagnoses: Principal Problem:   Bipolar affective disorder, current episode manic with psychotic symptoms ([place]) Active Problems:   Tobacco use disorder   CKD (chronic kidney disease) stage V requiring chronic dialysis ([place])   Anemia of renal disease   Dyslipidemia   Asthma   Benign essential HTN   Bipolar affective disorder, manic, severe, with psychotic behavior ([place])   Current Medications:  Current Facility-Administered Medications  Medication Dose Route Frequency Provider Last Rate Last Dose  . acetaminophen (TYLENOL) tablet 650 mg  650 mg Oral Q6H PRN Jolanta B Pucilowska, MD      . albuterol (PROVENTIL HFA;VENTOLIN HFA) 108 (90 Base) MCG/ACT inhaler 2 puff  2 puff Inhalation Q6H PRN Jolanta B Pucilowska, MD      . alum & mag hydroxide-simeth (MAALOX/MYLANTA) 200-200-20 MG/5ML suspension 30 mL  30 mL Oral PRN Jolanta B Pucilowska, MD      . alum & mag hydroxide-simeth (MAALOX/MYLANTA) 200-200-20 MG/5ML suspension 30 mL  30 mL Oral Q4H PRN Jolanta B Pucilowska, MD      . cinacalcet (SENSIPAR) tablet 60 mg  60 mg Oral Q supper Clovis Fredrickson, MD   60 mg at 01/06/16 2052  . divalproex (DEPAKOTE ER) 24 hr tablet 1,000 mg  1,000 mg Oral BID Clovis Fredrickson, MD   1,000 mg at 01/07/16 0849  . docusate sodium (COLACE) capsule 100 mg  100 mg Oral BID Clovis Fredrickson, MD   100 mg at 01/07/16 0849  . doxepin (SINEQUAN) capsule 50 mg  50 mg Oral QHS Clovis Fredrickson, MD   50 mg at 01/06/16 2054  . epoetin alfa (EPOGEN,PROCRIT) injection 4,000 Units  4,000 Units Intravenous Q T,Th,Sa-HD Murlean Iba, MD      . ibuprofen (ADVIL,MOTRIN) tablet 600 mg  600 mg Oral Q8H PRN Jolanta B Pucilowska, MD      . LORazepam (ATIVAN) tablet 2 mg  2  mg Oral Q6H PRN Gonzella Lex, MD   2 mg at 01/07/16 0150  . magnesium hydroxide (MILK OF MAGNESIA) suspension 30 mL  30 mL Oral Daily PRN Jolanta B Pucilowska, MD      . metoprolol tartrate (LOPRESSOR) tablet 12.5 mg  12.5 mg Oral BID Clovis Fredrickson, MD   12.5 mg at 01/06/16 2054  . mometasone-formoterol (DULERA) 100-5 MCG/ACT inhaler 2 puff  2 puff Inhalation BID Clovis Fredrickson, MD   2 puff at 01/07/16 0848  . multivitamin (RENA-VIT) tablet 1 tablet  1 tablet Oral QHS Clovis Fredrickson, MD   1 tablet at 01/06/16 2054  . ondansetron (ZOFRAN) tablet 4 mg  4 mg Oral Q8H PRN Jolanta B Pucilowska, MD      . pantoprazole (PROTONIX) EC tablet 40 mg  40 mg Oral BID Clovis Fredrickson, MD   40 mg at 01/07/16 0849  . polyethylene glycol (MIRALAX / GLYCOLAX) packet 17 g  17 g Oral Daily Jolanta B Pucilowska, MD   17 g at 01/07/16 0849  . senna-docusate (Senokot-S) tablet 2 tablet  2 tablet Oral BID Clovis Fredrickson, MD   2 tablet at 01/07/16 0849  . sevelamer carbonate (RENVELA) tablet 1,600 mg  1,600 mg Oral TID WC Jolanta B Pucilowska, MD   1,600 mg at 01/07/16 0849  . simvastatin (ZOCOR) tablet 10 mg  10 mg Oral QHS Clovis Fredrickson, MD   10 mg at 01/06/16 2055  . ziprasidone (GEODON) capsule 40 mg  40 mg Oral BID WC Jolanta B Pucilowska, MD   40 mg at 01/07/16 0849    PTA Medications: Prescriptions Prior to Admission  Medication Sig Dispense Refill Last Dose  . albuterol (PROVENTIL HFA;VENTOLIN HFA) 108 (90 Base) MCG/ACT inhaler Inhale 2 puffs into the lungs every 6 (six) hours as needed for wheezing or shortness of breath. 1 Inhaler 0 Past Week at Unknown time  . albuterol (PROVENTIL) (2.5 MG/3ML) 0.083% nebulizer solution Take 2.5 mg by nebulization every 6 (six) hours as needed for wheezing.   12/31/2015 at Unknown time  . B Complex-C (B-COMPLEX WITH VITAMIN C) tablet Take 1 tablet by mouth daily.   12/31/2015 at Unknown time  . b complex-vitamin c-folic acid (NEPHRO-VITE)  0.8 MG TABS tablet Take 1 tablet by mouth at bedtime. 30 tablet 0 01/01/2016 at Unknown time  . cinacalcet (SENSIPAR) 60 MG tablet Take 1 tablet (60 mg total) by mouth daily. Takes with largest meal once daily (Patient taking differently: Take 60 mg by mouth daily. ) 30 tablet 0 unknown at unknown  . Darbepoetin Alfa (ARANESP) 40 MCG/0.4ML SOSY injection Inject 0.4 mLs (40 mcg total) into the vein every Saturday with hemodialysis. 8.4 mL  Past Week at Unknown time  . divalproex (DEPAKOTE ER) 500 MG 24 hr tablet Take 2 tablets (1,000 mg total) by mouth 2 (two) times daily. 120 tablet 0   . Docusate Sodium (DSS) 100 MG CAPS Take 100 mg by mouth 2 (two) times daily. 60 each 0 Past Week at Unknown time  . doxepin (SINEQUAN) 50 MG capsule Take 1 capsule (50 mg total) by mouth at bedtime. (Patient taking differently: Take 50 mg by mouth at bedtime as needed (sleep). ) 30 capsule 0 Past Week at Unknown time  . furosemide (LASIX) 40 MG tablet Take 60 mg by mouth 2 (two) times daily.  0   . hydrOXYzine (ATARAX/VISTARIL) 25 MG tablet Take 25 mg by mouth 2 (two) times daily as needed for anxiety.     Marland Kitchen levofloxacin (LEVAQUIN) 250 MG tablet Take 1 tablet (250 mg total) by mouth every other day. 5 tablet 0   . LORazepam (ATIVAN) 0.5 MG tablet Take 1 tablet (0.5 mg total) by mouth 3 (three) times daily. 90 tablet 0 Past Week at Unknown time  . Melatonin 5 MG TABS Take 5 mg by mouth at bedtime.     . metoprolol tartrate (LOPRESSOR) 25 MG tablet Take 0.5 tablets (12.5 mg total) by mouth 2 (two) times daily.   01/01/2016 at [place]  . mometasone-formoterol (DULERA) 100-5 MCG/ACT AERO Inhale 2 puffs into the lungs 2 (two) times daily. 1 Inhaler 0 12/31/2015 at Unknown time  . nicotine (NICODERM CQ - DOSED IN MG/24 HOURS) 21 mg/24hr patch Place 1 patch (21 mg total) onto the skin daily. 28 patch 0   . ondansetron (ZOFRAN-ODT) 4 MG disintegrating tablet Take 4 mg by mouth every 6 (six) hours as needed for nausea or vomiting.    Past Month at Unknown time  . pantoprazole (PROTONIX) 40 MG tablet Take 1 tablet (40 mg total) by mouth 2 (two) times daily. 60 tablet 0 12/31/2015 at Unknown time  . polyethylene glycol (MIRALAX / GLYCOLAX) packet Take 17 g by mouth daily.   Past Month at Unknown time  . sennosides-docusate sodium (SENOKOT-S) 8.6-50 MG tablet Take 2 tablets by mouth 2 (  two) times daily.   12/31/2015 at Unknown time  . sevelamer carbonate (RENVELA) 800 MG tablet Take 2 tablets (1,600 mg total) by mouth 3 (three) times daily with meals. Takes 2 tabs with each meal and snacks as directed (Patient taking differently: Take 1,600 mg by mouth 3 (three) times daily with meals. ) 180 tablet 0 12/31/2015 at Unknown time  . simvastatin (ZOCOR) 10 MG tablet Take 1 tablet (10 mg total) by mouth at bedtime. Reported on 05/30/2015 30 tablet 0 12/31/2015 at Unknown time  . ziprasidone (GEODON) 60 MG capsule Take 1 capsule (60 mg total) by mouth 2 (two) times daily with a meal. 60 capsule 0 12/31/2015 at Unknown time    Treatment Modalities: Medication Management, Group therapy, Case management,  1 to 1 session with clinician, Psychoeducation, Recreational therapy.   Physician Treatment Plan for Primary Diagnosis: Bipolar affective disorder, current episode manic with psychotic symptoms ([place]) Long Term Goal(s): Improvement in symptoms so as ready for discharge  Short Term Goals: Ability to identify changes in lifestyle to reduce recurrence of condition will improve, Ability to demonstrate self-control will improve, Ability to identify and develop effective coping behaviors will improve, Ability to maintain clinical measurements within normal limits will improve and Compliance with prescribed medications will improve  Medication Management: Evaluate patient's response, side effects, and tolerance of medication regimen.  Therapeutic Interventions: 1 to 1 sessions, Unit Group sessions and Medication administration.  Evaluation of  Outcomes: Progressing  Physician Treatment Plan for Secondary Diagnosis: Principal Problem:   Bipolar affective disorder, current episode manic with psychotic symptoms ([place]) Active Problems:   Tobacco use disorder   CKD (chronic kidney disease) stage V requiring chronic dialysis ([facility])   Anemia of renal disease   Dyslipidemia   Asthma   Benign essential HTN   Bipolar affective disorder, manic, severe, with psychotic behavior ([place])   Long Term Goal(s): Improvement in symptoms so as ready for discharge  Short Term Goals: Ability to identify changes in lifestyle to reduce recurrence of condition will improve, Ability to demonstrate self-control will improve, Ability to identify and develop effective coping behaviors will improve, Ability to maintain clinical measurements within normal limits will improve and Compliance with prescribed medications will improve  Medication Management: Evaluate patient's response, side effects, and tolerance of medication regimen.  Therapeutic Interventions: 1 to 1 sessions, Unit Group sessions and Medication administration.  Evaluation of Outcomes: Progressing   RN Treatment Plan for Primary Diagnosis: Bipolar affective disorder, current episode manic with psychotic symptoms ([place]) Long Term Goal(s): Knowledge of disease and therapeutic regimen to maintain health will improve  Short Term Goals: Ability to remain free from injury will improve, Ability to verbalize frustration and anger appropriately will improve, Ability to demonstrate self-control, Ability to participate in decision making will improve, Ability to verbalize feelings will improve, Ability to identify and develop effective coping behaviors will improve and Compliance with prescribed medications will improve  Medication Management: RN will administer medications as ordered by provider, will assess and evaluate patient's response and provide education to patient for prescribed medication. RN will  report any adverse and/or side effects to prescribing provider.  Therapeutic Interventions: 1 on 1 counseling sessions, Psychoeducation, Medication administration, Evaluate responses to treatment, Monitor vital signs and CBGs as ordered, Perform/monitor CIWA, COWS, AIMS and Fall Risk screenings as ordered, Perform wound care treatments as ordered.  Evaluation of Outcomes: Progressing   LCSW Treatment Plan for Primary Diagnosis: Bipolar affective disorder, current episode manic with psychotic symptoms ([place]) Long  Term Goal(s): Safe transition to appropriate next level of care at discharge, Engage patient in therapeutic group addressing interpersonal concerns.  Short Term Goals: Engage patient in aftercare planning with referrals and resources, Increase social support, Increase emotional regulation, Facilitate acceptance of mental health diagnosis and concerns and Increase skills for wellness and recovery  Therapeutic Interventions: Assess for all discharge needs, 1 to 1 time with Social worker, Explore available resources and support systems, Assess for adequacy in community support network, Educate family and significant other(s) on suicide prevention, Complete Psychosocial Assessment, Interpersonal group therapy.  Evaluation of Outcomes: Progressing   Progress in Treatment: Attending groups: Yes Participating in groups: Yes Taking medication as prescribed: Yes, MD continues to assess for medication changes as needed Toleration medication: Yes, no side effects reported at this time Family/Significant other contact made: CSW, still assessing for appropriate contacts Patient understands diagnosis: Yes Discussing patient identified problems/goals with staff: Yes Medical problems stabilized or resolved: Ongoing / YesXXX Denies suicidal/homicidal ideation: Yes Issues/concerns per patient self-inventory: None Other: N/A  New problem(s) identified: None identified at this time.   New Short  Term/Long Term Goal(s): None identified at this time.   Discharge Plan or Barriers: Pt will discharge home to ___ and will follow up with ___ for medication management and therapy   Reason for Continuation of Hospitalization: Anxiety Delusions  Depression Hallucinations Homicidal ideation [place] Medical Issues Medication stabilization Suicidal ideation Withdrawal symptoms  Estimated Length of Stay/Date of discharge: 3-5 days  Attendees: Patient: 01/07/2016  10:37 AM  Physician:  01/07/2016  10:37 AM  Nursing:  01/07/2016  10:37 AM  RN Care Manager: 01/07/2016  10:37 AM  Social Worker:  01/07/2016  10:37 AM  Recreational Therapist:  01/07/2016  10:37 AM  Other:  01/07/2016  10:37 AM  Other:  01/07/2016  10:37 AM  Other: 01/07/2016  10:37 AM    Scribe for Treatment Team:  Alphonse Guild. Yoshua Geisinger MSW, LCSWA, LCAS Interdisciplinary Treatment and Diagnostic Plan Update  01/07/2016 Time of Session: 11:27 AM  CAPTOLIA LANGSTAFF MRN: [id number]  Principal Diagnosis: Bipolar affective disorder, current episode manic with psychotic symptoms ([place])  Secondary Diagnoses: Principal Problem:   Bipolar affective disorder, current episode manic with psychotic symptoms ([place]) Active Problems:   Tobacco use disorder   CKD (chronic kidney disease) stage V requiring chronic dialysis ([place])   Anemia of renal disease   Dyslipidemia   Asthma   Benign essential HTN   Bipolar affective disorder, manic, severe, with psychotic behavior ([place])   Current Medications:  Current Facility-Administered Medications  Medication Dose Route Frequency Provider Last Rate Last Dose  . acetaminophen (TYLENOL) tablet 650 mg  650 mg Oral Q6H PRN Jolanta B Pucilowska, MD      . albuterol (PROVENTIL HFA;VENTOLIN HFA) 108 (90 Base) MCG/ACT inhaler 2 puff  2 puff Inhalation Q6H PRN Jolanta B Pucilowska, MD      . alum & mag hydroxide-simeth (MAALOX/MYLANTA) 200-200-20 MG/5ML suspension 30 mL  30 mL Oral PRN Jolanta B  Pucilowska, MD      . alum & mag hydroxide-simeth (MAALOX/MYLANTA) 200-200-20 MG/5ML suspension 30 mL  30 mL Oral Q4H PRN Jolanta B Pucilowska, MD      . cinacalcet (SENSIPAR) tablet 60 mg  60 mg Oral Q supper Clovis Fredrickson, MD   60 mg at 01/06/16 2052  . divalproex (DEPAKOTE ER) 24 hr tablet 1,000 mg  1,000 mg Oral BID Clovis Fredrickson, MD   1,000 mg at 01/07/16 0849  . docusate  sodium (COLACE) capsule 100 mg  100 mg Oral BID Clovis Fredrickson, MD   100 mg at 01/07/16 0849  . doxepin (SINEQUAN) capsule 50 mg  50 mg Oral QHS Clovis Fredrickson, MD   50 mg at 01/06/16 2054  . epoetin alfa (EPOGEN,PROCRIT) injection 4,000 Units  4,000 Units Intravenous Q T,Th,Sa-HD Murlean Iba, MD      . ibuprofen (ADVIL,MOTRIN) tablet 600 mg  600 mg Oral Q8H PRN Jolanta B Pucilowska, MD      . LORazepam (ATIVAN) tablet 2 mg  2 mg Oral Q6H PRN Gonzella Lex, MD   2 mg at 01/07/16 0150  . magnesium hydroxide (MILK OF MAGNESIA) suspension 30 mL  30 mL Oral Daily PRN Jolanta B Pucilowska, MD      . metoprolol tartrate (LOPRESSOR) tablet 12.5 mg  12.5 mg Oral BID Clovis Fredrickson, MD   12.5 mg at 01/06/16 2054  . mometasone-formoterol (DULERA) 100-5 MCG/ACT inhaler 2 puff  2 puff Inhalation BID Clovis Fredrickson, MD   2 puff at 01/07/16 0848  . multivitamin (RENA-VIT) tablet 1 tablet  1 tablet Oral QHS Clovis Fredrickson, MD   1 tablet at 01/06/16 2054  . ondansetron (ZOFRAN) tablet 4 mg  4 mg Oral Q8H PRN Jolanta B Pucilowska, MD      . pantoprazole (PROTONIX) EC tablet 40 mg  40 mg Oral BID Clovis Fredrickson, MD   40 mg at 01/07/16 0849  . polyethylene glycol (MIRALAX / GLYCOLAX) packet 17 g  17 g Oral Daily Jolanta B Pucilowska, MD   17 g at 01/07/16 0849  . senna-docusate (Senokot-S) tablet 2 tablet  2 tablet Oral BID Clovis Fredrickson, MD   2 tablet at 01/07/16 0849  . sevelamer carbonate (RENVELA) tablet 1,600 mg  1,600 mg Oral TID WC Jolanta B Pucilowska, MD   1,600 mg at 01/07/16  0849  . simvastatin (ZOCOR) tablet 10 mg  10 mg Oral QHS Clovis Fredrickson, MD   10 mg at 01/06/16 2055  . ziprasidone (GEODON) capsule 40 mg  40 mg Oral BID WC Jolanta B Pucilowska, MD   40 mg at 01/07/16 0849    PTA Medications: Prescriptions Prior to Admission  Medication Sig Dispense Refill Last Dose  . albuterol (PROVENTIL HFA;VENTOLIN HFA) 108 (90 Base) MCG/ACT inhaler Inhale 2 puffs into the lungs every 6 (six) hours as needed for wheezing or shortness of breath. 1 Inhaler 0 Past Week at Unknown time  . albuterol (PROVENTIL) (2.5 MG/3ML) 0.083% nebulizer solution Take 2.5 mg by nebulization every 6 (six) hours as needed for wheezing.   12/31/2015 at Unknown time  . B Complex-C (B-COMPLEX WITH VITAMIN C) tablet Take 1 tablet by mouth daily.   12/31/2015 at Unknown time  . b complex-vitamin c-folic acid (NEPHRO-VITE) 0.8 MG TABS tablet Take 1 tablet by mouth at bedtime. 30 tablet 0 01/01/2016 at Unknown time  . cinacalcet (SENSIPAR) 60 MG tablet Take 1 tablet (60 mg total) by mouth daily. Takes with largest meal once daily (Patient taking differently: Take 60 mg by mouth daily. ) 30 tablet 0 unknown at unknown  . Darbepoetin Alfa (ARANESP) 40 MCG/0.4ML SOSY injection Inject 0.4 mLs (40 mcg total) into the vein every Saturday with hemodialysis. 8.4 mL  Past Week at Unknown time  . divalproex (DEPAKOTE ER) 500 MG 24 hr tablet Take 2 tablets (1,000 mg total) by mouth 2 (two) times daily. 120 tablet 0   . Docusate Sodium (DSS) 100  MG CAPS Take 100 mg by mouth 2 (two) times daily. 60 each 0 Past Week at Unknown time  . doxepin (SINEQUAN) 50 MG capsule Take 1 capsule (50 mg total) by mouth at bedtime. (Patient taking differently: Take 50 mg by mouth at bedtime as needed (sleep). ) 30 capsule 0 Past Week at Unknown time  . furosemide (LASIX) 40 MG tablet Take 60 mg by mouth 2 (two) times daily.  0   . hydrOXYzine (ATARAX/VISTARIL) 25 MG tablet Take 25 mg by mouth 2 (two) times daily as needed for  anxiety.     Marland Kitchen levofloxacin (LEVAQUIN) 250 MG tablet Take 1 tablet (250 mg total) by mouth every other day. 5 tablet 0   . LORazepam (ATIVAN) 0.5 MG tablet Take 1 tablet (0.5 mg total) by mouth 3 (three) times daily. 90 tablet 0 Past Week at Unknown time  . Melatonin 5 MG TABS Take 5 mg by mouth at bedtime.     . metoprolol tartrate (LOPRESSOR) 25 MG tablet Take 0.5 tablets (12.5 mg total) by mouth 2 (two) times daily.   01/01/2016 at [place]  . mometasone-formoterol (DULERA) 100-5 MCG/ACT AERO Inhale 2 puffs into the lungs 2 (two) times daily. 1 Inhaler 0 12/31/2015 at Unknown time  . nicotine (NICODERM CQ - DOSED IN MG/24 HOURS) 21 mg/24hr patch Place 1 patch (21 mg total) onto the skin daily. 28 patch 0   . ondansetron (ZOFRAN-ODT) 4 MG disintegrating tablet Take 4 mg by mouth every 6 (six) hours as needed for nausea or vomiting.   Past Month at Unknown time  . pantoprazole (PROTONIX) 40 MG tablet Take 1 tablet (40 mg total) by mouth 2 (two) times daily. 60 tablet 0 12/31/2015 at Unknown time  . polyethylene glycol (MIRALAX / GLYCOLAX) packet Take 17 g by mouth daily.   Past Month at Unknown time  . sennosides-docusate sodium (SENOKOT-S) 8.6-50 MG tablet Take 2 tablets by mouth 2 (two) times daily.   12/31/2015 at Unknown time  . sevelamer carbonate (RENVELA) 800 MG tablet Take 2 tablets (1,600 mg total) by mouth 3 (three) times daily with meals. Takes 2 tabs with each meal and snacks as directed (Patient taking differently: Take 1,600 mg by mouth 3 (three) times daily with meals. ) 180 tablet 0 12/31/2015 at Unknown time  . simvastatin (ZOCOR) 10 MG tablet Take 1 tablet (10 mg total) by mouth at bedtime. Reported on 05/30/2015 30 tablet 0 12/31/2015 at Unknown time  . ziprasidone (GEODON) 60 MG capsule Take 1 capsule (60 mg total) by mouth 2 (two) times daily with a meal. 60 capsule 0 12/31/2015 at Unknown time    Treatment Modalities: Medication Management, Group therapy, Case management,  1 to 1 session  with clinician, Psychoeducation, Recreational therapy.   Physician Treatment Plan for Primary Diagnosis: Bipolar affective disorder, current episode manic with psychotic symptoms ([place]) Long Term Goal(s): Improvement in symptoms so as ready for discharge  Short Term Goals: Ability to identify changes in lifestyle to reduce recurrence of condition will improve, Ability to demonstrate self-control will improve, Ability to identify and develop effective coping behaviors will improve, Ability to maintain clinical measurements within normal limits will improve and Compliance with prescribed medications will improve  Medication Management: Evaluate patient's response, side effects, and tolerance of medication regimen.  Therapeutic Interventions: 1 to 1 sessions, Unit Group sessions and Medication administration.  Evaluation of Outcomes: Progressing  Physician Treatment Plan for Secondary Diagnosis: Principal Problem:   Bipolar affective disorder, current episode  manic with psychotic symptoms ([facility]) Active Problems:   Tobacco use disorder   CKD (chronic kidney disease) stage V requiring chronic dialysis ([facility])   Anemia of renal disease   Dyslipidemia   Asthma   Benign essential HTN   Bipolar affective disorder, manic, severe, with psychotic behavior ([place])   Long Term Goal(s): Improvement in symptoms so as ready for discharge  Short Term Goals: Ability to identify changes in lifestyle to reduce recurrence of condition will improve, Ability to demonstrate self-control will improve, Ability to identify and develop effective coping behaviors will improve, Ability to maintain clinical measurements within normal limits will improve and Compliance with prescribed medications will improve  Medication Management: Evaluate patient's response, side effects, and tolerance of medication regimen.  Therapeutic Interventions: 1 to 1 sessions, Unit Group sessions and Medication administration.  Evaluation of  Outcomes: Progressing   RN Treatment Plan for Primary Diagnosis: Bipolar affective disorder, current episode manic with psychotic symptoms ([place]) Long Term Goal(s): Knowledge of disease and therapeutic regimen to maintain health will improve  Short Term Goals: Ability to remain free from injury will improve, Ability to verbalize frustration and anger appropriately will improve, Ability to demonstrate self-control, Ability to verbalize feelings will improve, Ability to disclose and discuss suicidal ideas, Ability to identify and develop effective coping behaviors will improve and Compliance with prescribed medications will improve  Medication Management: RN will administer medications as ordered by provider, will assess and evaluate patient's response and provide education to patient for prescribed medication. RN will report any adverse and/or side effects to prescribing provider.  Therapeutic Interventions: 1 on 1 counseling sessions, Psychoeducation, Medication administration, Evaluate responses to treatment, Monitor vital signs and CBGs as ordered, Perform/monitor CIWA, COWS, AIMS and Fall Risk screenings as ordered, Perform wound care treatments as ordered.  Evaluation of Outcomes: Progressing   LCSW Treatment Plan for Primary Diagnosis: Bipolar affective disorder, current episode manic with psychotic symptoms ([place]) Long Term Goal(s): Safe transition to appropriate next level of care at discharge, Engage patient in therapeutic group addressing interpersonal concerns.  Short Term Goals: Engage patient in aftercare planning with referrals and resources, Increase social support, Increase ability to appropriately verbalize feelings, Increase emotional regulation, Facilitate acceptance of mental health diagnosis and concerns and Increase skills for wellness and recovery  Therapeutic Interventions: Assess for all discharge needs, 1 to 1 time with Social worker, Explore available resources and support  systems, Assess for adequacy in community support network, Educate family and significant other(s) on suicide prevention, Complete Psychosocial Assessment, Interpersonal group therapy.  Evaluation of Outcomes: Progressing   Progress in Treatment: Attending groups: CSW still assessing, pt new to milieu Participating in groups: CSW still assessing, pt new to milieu Taking medication as prescribed: Yes, MD continues to assess for medication changes as needed Toleration medication: Yes, no side effects reported at this time Family/Significant other contact made: CSW, still assessing for appropriate contacts Patient understands diagnosis: Yes Discussing patient identified problems/goals with staff: Yes Medical problems stabilized or resolved: Ongoing  Denies suicidal/homicidal ideation: Yes Issues/concerns per patient self-inventory: None Other: N/A  New problem(s) identified: None identified at this time.   New Short Term/Long Term Goal(s): None identified at this time.   Discharge Plan or Barriers: Pt will discharge home to [facility] and will follow up with [facility] for medication management and therapy   Reason for Continuation of Hospitalization: Anxiety Delusions  Depression Hallucinations Mania Medical Issues Medication stabilization Suicidal ideation   Estimated Length of Stay: 3-5 days  Attendees: Patient: Jennifer Lawson 01/07/2016  9:40 AM  Physician: Dr. Bary Leriche, MD 01/07/2016  9:40 AM  Nursing: Elige Radon, RN 01/07/2016  9:40 AM  RN Care Manager:  01/07/2016  9:40 AM  Social Worker: Alphonse Guild. Daine Gravel, LCAS   01/07/2016  9:40 AM  Recreational Therapist:                       01/07/2016  9:40 AM  Social Worker:  01/07/2016  9:40 AM  Other:  01/07/2016  9:40 AM  Other: 01/07/2016  9:40 AM    Scribe for Treatment Team:  Alphonse Guild. Arlan Birks MSW, LCSWA, LCAS

## 2016-01-07 NOTE — B[facility] Group Notes (Signed)
[facility] LCSW Group Therapy  01/07/2016 12:45 PM  Type of Therapy:  Group Therapy  Participation Level:  Pt did not attend group. CSW invited pt to group.   Summary of Progress/Problems:Balance in life: Patients will discuss the concept of balance and how it looks and feels to be unbalanced. Pt will identify areas in their life that is unbalanced and ways to become more balanced.    Tamico Mundo G. Roscoe, [place] 01/07/2016, 12:46 PM
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
## 2016-01-07 NOTE — Progress Notes (Signed)
Pre hd 

## 2016-01-07 NOTE — Progress Notes (Signed)
Post dialysis 

## 2016-01-07 NOTE — Progress Notes (Signed)
Pre Dialysis 

## 2016-01-07 NOTE — Plan of Care (Signed)
Problem: Coping: Goal: Ability to verbalize frustrations and anger appropriately will improve Outcome: Not Progressing Pt yells at staff, swears, and makes demands.
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
## 2016-01-07 NOTE — Plan of Care (Signed)
Problem: Safety: Goal: Ability to redirect hostility and anger into socially appropriate behaviors will improve Outcome: Not Progressing Argumentative.   Calls people names.

## 2016-01-07 NOTE — BHH Counselor (Signed)
CSW attempted PSA but pt was too acute as evidenced by the pt presenting as manic, agitated and irritable.  Alphonse Guild. Farouk Vivero, LCSWA, LCAS   01/07/16

## 2016-01-08 NOTE — Plan of Care (Signed)
Problem: Safety: Goal: Ability to redirect hostility and anger into socially appropriate behaviors will improve Outcome: Not Progressing Pt is verbally aggressive towards staff.  Problem: Education: Goal: Verbalization of understanding the information provided will improve Outcome: Not Progressing Pt does not show understanding of the information provided regarding medications prescribed.

## 2016-01-08 NOTE — [facility] Group Notes (Signed)
[place] Group Notes:  (Nursing/MHT/Case Management/Adjunct)  Date:  01/08/2016  Time:  2:29 AM  Type of Therapy:  Group Therapy  Participation Level:  Minimal  Participation Quality:  Attentive  Affect:  Defensive and Irritable  Cognitive:  Disorganized  Insight:  Lacking  Engagement in Group:  Lacking and Off Topic  Modes of Intervention:  Discussion  Summary of Progress/Problems: When staff asked pt about her goal. Pt begin to talk about her stay at [facility] for her pneumonia. Staff attempted to redirect pt to goal setting. Pt begin to get upset with staff for changing the subject and stated that staff did know their job.   [name] 01/08/2016, 2:29 AM
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
## 2016-01-08 NOTE — BHH Group Notes (Signed)
[facility] Group Notes:  (Nursing/MHT/Case Management/Adjunct)  Date:  01/08/2016  Time:  6:14 PM  Type of Therapy:  Psychoeducational Skills  Participation Level:  Did Not Attend   Adela Lank [facility], [place] 01/08/2016, 6:14 PM

## 2016-01-08 NOTE — BHH Group Notes (Signed)
[facility] LCSW Group Therapy  01/08/2016 2:14 PM  Type of Therapy:  Group Therapy  Participation Level:  None  Participation Quality:  Drowsy  Affect:  Lethargic  Cognitive:  Lacking  Insight:  None  Engagement in Therapy:  Limited  Modes of Intervention:  Activity, Discussion, Education and Support  Summary of Progress/Problems:Feelings around Relapse. Group members discussed the meaning of relapse and shared personal stories of relapse, how it affected them and others, and how they perceived themselves during this time. Group members were encouraged to identify triggers, warning signs and coping skills used when facing the possibility of relapse. Social supports were discussed and explored in detail. Patients also discussed facing disappointment and how that can trigger someone to relapse. Patient attended group on this date but did not participate due to falling asleep. Patient remained asleep for the duration of group.    Jacquiline Zurcher G. [place], [place] 01/08/2016, 2:14 PM
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
## 2016-01-08 NOTE — Progress Notes (Signed)
D: Pt is labile this evening. She becomes verbally aggressive towards staff, stating "you aren't qualified for this. You should lose your job" to several staff members. Pt refuses zocor this evening despite RN encouragement. Pt uses wheelchair without difficulty.  A: Emotional support and encouragement provided. Medications administered with education. q15 minute safety checks maintained. R: Pt remains free from harm. Will continue to monitor.
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
## 2016-01-09 MED ORDER — MUSCLE RUB 10-15 % EX CREA
TOPICAL_CREAM | Freq: Two times a day (BID) | CUTANEOUS | Status: DC | PRN
  Filled 2016-01-09: qty 85

## 2016-01-09 NOTE — Progress Notes (Signed)
[facility] MD Progress Note  01/09/2016 5:57 PM JAMEYAH COONES  MRN:  [id number]  Subjective:  Ms. Distel is a 55 year old female with a history of bipolar disorder and and and kidney disease on dialysis admitted for psychotic agitation and aggressive behavior.   The patient was seen in dialysis today. She reports that she feels "okay" but has some anger towards her daughter and wants a new living situation. The patient feels like her daughter is manipulating her and taking her money. He denies any current active or passive suicidal thoughts, auditory or visual hallucinations. She does appear to have some paranoid thoughts or delusional thoughts about her daughter and other family members. She has been calm and cooperative on the unit and does appear to be interacting with peers appropriately. She denies any new somatic complaints but complains of muscle aches and pain "everywhere". She denies any change in appetite. So far, she has been compliant with psychotropic medications. She slept 7 hours last night.  Principal Problem: Bipolar affective disorder, current episode manic with psychotic symptoms ([place]) Diagnosis:   Patient Active Problem List   Diagnosis Date Noted  . Bipolar affective disorder, manic, severe, with psychotic behavior ([place]) [F31.2] 01/06/2016  . HCAP (healthcare-associated pneumonia) [J18.9] 01/01/2016  . Acute blood loss anemia [D62] 11/25/2015  . GI bleed [K92.2] 11/24/2015  . Benign essential HTN [I10] 11/24/2015  . Physical deconditioning [R53.81]   . Asthma [J45.909] 08/06/2015  . Cerebrovascular disease [I67.9] 07/23/2015  . Dyslipidemia [E78.5] 07/14/2015  . Bipolar affective disorder, current episode manic with psychotic symptoms ([place]) [F31.2] 07/13/2015  . Anemia of renal disease [D63.1] 01/26/2015  . CKD (chronic kidney disease) stage V requiring chronic dialysis ([place]) [N18.6, Z99.2] 05/13/2014  . COPD (chronic obstructive pulmonary disease) ([place]) [J44.9]  09/26/2013  . Hyperparathyroidism, primary ([place]) [E21.0] 02/20/2013  . Tobacco use disorder [F17.200] 11/13/2012   Total Time spent with patient: 20 minutes  Past Psychiatric History: Bipolar disorder.  Past Medical History:  Past Medical History:  Diagnosis Date  . Anemia of renal disease   . Asthma   . Bipolar 1 disorder ([place])   . Chronic kidney disease    06/11/13- not on dialysis  . Complication of anesthesia    difficulty going to sleep  . COPD (chronic obstructive pulmonary disease) ([place])   . Depression   . Diabetes mellitus without complication ([place])    denies  . GERD (gastroesophageal reflux disease)   . Heart murmur   . History of blood transfusion   . Hypertension   . Mental disorder   . Overdose   . PTSD (post-traumatic stress disorder)   . Seizures ([place])    "passsed out"  . Shortness of breath    lying down flat  . Tobacco use disorder 11/13/2012    Past Surgical History:  Procedure Laterality Date  . AV FISTULA PLACEMENT Right 06/12/2013   Procedure: ARTERIOVENOUS (AV) FISTULA CREATION; RIGHT  BASILIC VEIN TRANSPOSITION with Intraoperative ultrasound;  Surgeon: Mal [name], MD;  Location: [facility];  Service: Vascular;  Laterality: Right;  . COLONOSCOPY N/A 11/30/2015   Procedure: COLONOSCOPY;  Surgeon: Wilford Corner, MD;  Location: [facility] ENDOSCOPY;  Service: Endoscopy;  Laterality: N/A;  . ESOPHAGOGASTRODUODENOSCOPY Left 11/14/2012   Procedure: ESOPHAGOGASTRODUODENOSCOPY (EGD);  Surgeon: [name], MD;  Location: [facility] ENDOSCOPY;  Service: Endoscopy;  Laterality: Left;  . ESOPHAGOGASTRODUODENOSCOPY N/A 11/30/2015   Procedure: ESOPHAGOGASTRODUODENOSCOPY (EGD);  Surgeon: Wilford Corner, MD;  Location: [facility] ENDOSCOPY;  Service: Endoscopy;  Laterality: N/A;  .  JOINT REPLACEMENT Right    knee  . PARATHYROIDECTOMY    . Right knee replacement     she says it was last year.   Family History:  Family History  Problem Relation Age of Onset  . Diabetes Mother   .  Hyperlipidemia Mother   . Hypertension Mother   . Diabetes Father   . Hypertension Father   . Hyperlipidemia Father    Family Psychiatric  History: See H&P. Social History:  History  Alcohol Use  . Yes    Comment: "I like a cold beer every once in a while"      History  Drug Use No    Comment: pt denied any drug use    Social History   Social History  . Marital status: Divorced    Spouse name: N/A  . Number of children: N/A  . Years of education: N/A   Social History Main Topics  . Smoking status: Current Every Day Smoker    Packs/day: 2.00    Years: 40.00    Types: Cigarettes, Cigars  . Smokeless tobacco: Never Used  . Alcohol use Yes     Comment: "I like a cold beer every once in a while"   . Drug use: No     Comment: pt denied any drug use  . Sexual activity: No   Other Topics Concern  . None   Social History Narrative  . None   Additional Social History:                         Sleep: Poor  Appetite:  Poor  Current Medications: Current Facility-Administered Medications  Medication Dose Route Frequency Provider Last Rate Last Dose  . acetaminophen (TYLENOL) tablet 650 mg  650 mg Oral Q6H PRN Jolanta B Pucilowska, MD      . albuterol (PROVENTIL HFA;VENTOLIN HFA) 108 (90 Base) MCG/ACT inhaler 2 puff  2 puff Inhalation Q6H PRN Jolanta B Pucilowska, MD      . alum & mag hydroxide-simeth (MAALOX/MYLANTA) 200-200-20 MG/5ML suspension 30 mL  30 mL Oral PRN Jolanta B Pucilowska, MD      . alum & mag hydroxide-simeth (MAALOX/MYLANTA) 200-200-20 MG/5ML suspension 30 mL  30 mL Oral Q4H PRN Jolanta B Pucilowska, MD      . cinacalcet (SENSIPAR) tablet 60 mg  60 mg Oral Q supper Clovis Fredrickson, MD   60 mg at 01/08/16 1636  . divalproex (DEPAKOTE ER) 24 hr tablet 1,000 mg  1,000 mg Oral BID Clovis Fredrickson, MD   1,000 mg at 01/09/16 0826  . docusate sodium (COLACE) capsule 100 mg  100 mg Oral BID Clovis Fredrickson, MD   100 mg at 01/09/16 0827   . doxepin (SINEQUAN) capsule 50 mg  50 mg Oral QHS Jolanta B Pucilowska, MD   50 mg at 01/08/16 2140  . epoetin alfa (EPOGEN,PROCRIT) injection 4,000 Units  4,000 Units Intravenous Q T,Th,Sa-HD Murlean Iba, MD   4,000 Units at 01/09/16 1524  . ibuprofen (ADVIL,MOTRIN) tablet 600 mg  600 mg Oral Q8H PRN Jolanta B Pucilowska, MD      . LORazepam (ATIVAN) tablet 2 mg  2 mg Oral Q6H PRN Gonzella Lex, MD   2 mg at 01/09/16 [id number]  . magnesium hydroxide (MILK OF MAGNESIA) suspension 30 mL  30 mL Oral Daily PRN Jolanta B Pucilowska, MD      . metoprolol tartrate (LOPRESSOR) tablet 12.5 mg  12.5 mg Oral  BID Clovis Fredrickson, MD   12.5 mg at 01/09/16 0827  . mometasone-formoterol (DULERA) 100-5 MCG/ACT inhaler 2 puff  2 puff Inhalation BID Clovis Fredrickson, MD   2 puff at 01/09/16 0828  . multivitamin (RENA-VIT) tablet 1 tablet  1 tablet Oral QHS Clovis Fredrickson, MD   1 tablet at 01/08/16 2139  . MUSCLE RUB CREA   Topical BID PRN Murlean Iba, MD      . nicotine (NICODERM CQ - dosed in mg/24 hours) patch 21 mg  21 mg Transdermal Daily Jolanta B Pucilowska, MD   21 mg at 01/09/16 0827  . ondansetron (ZOFRAN) tablet 4 mg  4 mg Oral Q8H PRN Jolanta B Pucilowska, MD      . pantoprazole (PROTONIX) EC tablet 40 mg  40 mg Oral BID Clovis Fredrickson, MD   40 mg at 01/09/16 0827  . polyethylene glycol (MIRALAX / GLYCOLAX) packet 17 g  17 g Oral Daily Clovis Fredrickson, MD   17 g at 01/09/16 0826  . senna-docusate (Senokot-S) tablet 2 tablet  2 tablet Oral BID Clovis Fredrickson, MD   2 tablet at 01/09/16 0827  . sevelamer carbonate (RENVELA) tablet 1,600 mg  1,600 mg Oral TID WC Jolanta B Pucilowska, MD   1,600 mg at 01/09/16 1418  . simvastatin (ZOCOR) tablet 10 mg  10 mg Oral QHS Jolanta B Pucilowska, MD   10 mg at 01/08/16 2140  . ziprasidone (GEODON) capsule 40 mg  40 mg Oral BID WC Jolanta B Pucilowska, MD   40 mg at 01/09/16 [id number]    Lab Results:  No results found for this or any  previous visit (from the past 48 hour(s)).  Blood Alcohol level:  Lab Results  Component Value Date   ETH <5 01/04/2016   ETH <5 [id number]    Metabolic Disorder Labs: Lab Results  Component Value Date   HGBA1C 4.4 (L) 01/02/2016   MPG 80 01/02/2016   MPG 97 05/14/2014   Lab Results  Component Value Date   PROLACTIN 126.4 (H) 06/03/2015   Lab Results  Component Value Date   CHOL 184 06/03/2015   TRIG 102 06/03/2015   HDL 65 06/03/2015   CHOLHDL 2.8 06/03/2015   VLDL 20 06/03/2015   LDLCALC 99 06/03/2015   LDLCALC 99 01/09/2015    Physical Findings: AIMS: Facial and Oral Movements Muscles of Facial Expression: None, normal Lips and Perioral Area: None, normal Jaw: None, normal Tongue: None, normal,Extremity Movements Upper (arms, wrists, hands, fingers): None, normal Lower (legs, knees, ankles, toes): None, normal, Trunk Movements Neck, shoulders, hips: None, normal, Overall Severity Severity of abnormal movements (highest score from questions above): None, normal Incapacitation due to abnormal movements: None, normal Patient's awareness of abnormal movements (rate only patient's report): No Awareness, Dental Status Current problems with teeth and/or dentures?: Yes Does patient usually wear dentures?: Yes  CIWA:    COWS:     Musculoskeletal: Strength & Muscle Tone: within normal limits Gait & Station: unsteady Patient leans: N/A  Psychiatric Specialty Exam: Physical Exam  Nursing note and vitals reviewed.   Review of Systems  Constitutional: Negative.  Negative for chills, fever, malaise/fatigue and weight loss.  HENT: Negative.  Negative for congestion, ear discharge, ear pain, hearing loss, nosebleeds, sore throat and tinnitus.   Eyes: Negative.  Negative for blurred vision, double vision and photophobia.  Respiratory: Negative.  Negative for cough, sputum production, shortness of breath, wheezing and stridor.   Cardiovascular: Negative.  Negative for  chest pain and palpitations.  Gastrointestinal: Negative.  Negative for abdominal pain, constipation, diarrhea, heartburn, nausea and vomiting.  Genitourinary: Negative.  Negative for dysuria, frequency and urgency.  Musculoskeletal:       The patient complaints of muscle aches and pains "everywhere".  Skin: Negative.  Negative for itching and rash.  Neurological: Negative for dizziness, tingling, tremors, sensory change, focal weakness, seizures, loss of consciousness and headaches.       The patient does have difficulty with ambulation and uses a walker.  Endo/Heme/Allergies: Does not bruise/bleed easily.    Blood pressure 108/65, pulse 91, temperature 98.4 F (36.9 C), temperature source Oral, resp. rate 19, weight 77.1 kg (169 lb 15.6 oz), last menstrual period 05/09/2008, SpO2 98 %.Body mass index is 31.09 kg/m.  General Appearance: Casual  Eye Contact:  Good  Speech:  Clear and Coherent  Volume:  Normal  Mood:  Euthymic  Affect:  Appropriate  Thought Process:  Goal Directed  Orientation:  Full (Time, Place, and Person)  Thought Content:  WDL  Suicidal Thoughts:  No  Homicidal Thoughts:  No  Memory:  Immediate;   Fair Recent;   Fair Remote;   Fair  Judgement:  Poor  Insight:  Shallow  Psychomotor Activity:  Decreased  Concentration:  Concentration: Fair and Attention Span: Fair  Recall:  [employer] of Knowledge:  Fair  Language:  Fair  Akathisia:  No  Handed:  Right  AIMS (if indicated):     Assets:  Communication Skills Desire for Improvement Financial Resources/Insurance Housing Resilience Social Support  ADL's:  Intact  Cognition:  WNL  Sleep:  Number of Hours: 7     Treatment Plan Summary: Daily contact with patient to assess and evaluate symptoms and progress in treatment and Medication management   Ms. Beichner is a 55 year old female with history of bipolar disorder and end-stage kidney disease on dialysis admitted for a mixed episode,  aggressive  behavior  and poor treatment compliance.   1. Bipolar mania. We continue Depakote ER 1000 mg bid and Geodon 40 mg bid for psychosis and mood stabilization. VPA 64.  2. Kidney disease. Nephrology consult is greatly appreciated. The patient will continue dialysis.  3. COPD. We continued inhalers, Albuterol and Dulera.  4. Hypertension. She is on metoprolol.   5. GERD. She is on Protonix.  6. Smoking. Nicotine patch is available.  7. Insomnia. We continued Doxepin.   8. Metabolic syndrome. Lipid panel, HgbA1C, TSH and PRL were done during prevoius admission.  9. Dyslipidemia. She is on Zocor.   10. Constipation. She is on Colace.   11. Chronic pain. The patient abuses all types of over-the-counter pain medication.  12. EKG. Normal sinus rhythm. QT 397.  13. Disposition. She will be discharged to home with her daughter. She will follow up with Dr. Rosine Door for medication management and her nephrologist for dialysis.    Jay Schlichter, MD 01/09/2016, 5:57 PM

## 2016-01-09 NOTE — Progress Notes (Signed)
Patient back from dialysis.Dinner offered.No issues verbalized.

## 2016-01-09 NOTE — Progress Notes (Signed)
HD COMPLETED  

## 2016-01-09 NOTE — Progress Notes (Signed)
Subjective:  Patient seen during dialysis Tolerating well    HEMODIALYSIS FLOWSHEET:  Blood Flow Rate (mL/min): 400 mL/min Arterial Pressure (mmHg): -180 mmHg Venous Pressure (mmHg): 150 mmHg Transmembrane Pressure (mmHg): 60 mmHg Ultrafiltration Rate (mL/min): 710 mL/min Dialysate Flow Rate (mL/min): 800 ml/min Conductivity: Machine : 14 Conductivity: Machine : 14 Dialysis Fluid Bolus: Normal Saline Bolus Amount (mL): 250 mL Dialysate Change: 2K Intra-Hemodialysis Comments: 837ml (resting with eyes open, Dr Candiss Norse at bedside)     Objective:  Vital signs in last 24 hours:  Temp:  [98.4 F (36.9 C)-98.8 F (37.1 C)] 98.4 F (36.9 C) (09/02 1455) Pulse Rate:  [87-89] 89 (09/02 1600) Resp:  [18] 18 (09/02 1600) BP: (114-130)/(65-83) 114/65 (09/02 1600) SpO2:  [98 %] 98 % (09/02 1455)  Weight change:  Filed Weights   01/07/16 1312 01/07/16 1648  Weight: 78.6 kg (173 lb 3.2 oz) 77.1 kg (169 lb 15.6 oz)    Intake/Output:    Intake/Output Summary (Last 24 hours) at 01/09/16 1620 Last data filed at 01/09/16 0931  Gross per 24 hour  Intake              720 ml  Output                0 ml  Net              720 ml     Physical Exam: General: NAD sitting in dialysis chair  HEENT Moist oral mucus membranes  Neck supple  Pulm/lungs Normal breathing effort  CVS/Heart Regular sinus on telemetry  Abdomen:  soft  Extremities: ++ edema  Neurologic: Resting quietly  Skin: Normal turgor  Access: AVG       Basic Metabolic Panel:   Recent Labs Lab 01/04/16 1943 01/06/16 1042 01/07/16 0726  NA 138 133*  --   K 5.3* 5.4*  --   CL 99* 97*  --   CO2 26 24  --   GLUCOSE 70 114*  --   BUN 56* 72*  --   CREATININE 5.48* 6.09*  --   CALCIUM 9.3 8.7*  --   PHOS  --   --  6.1*     CBC:  Recent Labs Lab 01/03/16 0511 01/04/16 1943 01/06/16 1042  WBC 8.4 8.4 5.7  HGB 9.0* 10.0* 9.0*  HCT 31.0* 32.7* 28.9*  MCV 101.3* 99.1 99.0  PLT 177 271 PLATELET CLUMPS  NOTED ON SMEAR, COUNT APPEARS ADEQUATE      Microbiology:  Recent Results (from the past 720 hour(s))  Culture, blood (routine x 2) Call MD if unable to obtain prior to antibiotics being given     Status: None   Collection Time: 01/01/16  6:39 PM  Result Value Ref Range Status   Specimen Description BLOOD BLOOD LEFT FOREARM  Final   Special Requests IN PEDIATRIC BOTTLE 4CC  Final   Culture NO GROWTH 5 DAYS  Final   Report Status 01/06/2016 FINAL  Final  Culture, blood (routine x 2)     Status: None   Collection Time: 01/01/16  6:49 PM  Result Value Ref Range Status   Specimen Description BLOOD BLOOD LEFT HAND  Final   Special Requests IN PEDIATRIC BOTTLE 3CC  Final   Culture NO GROWTH 5 DAYS  Final   Report Status 01/06/2016 FINAL  Final    Coagulation Studies: No results for input(s): LABPROT, INR in the last 72 hours.  Urinalysis: No results for input(s): COLORURINE, LABSPEC, [place], [place], [place], [place],  KETONESUR, PROTEINUR, UROBILINOGEN, NITRITE, LEUKOCYTESUR in the last 72 hours.  Invalid input(s): APPERANCEUR    Imaging: No results found.   Medications:     . cinacalcet  60 mg Oral Q supper  . divalproex  1,000 mg Oral BID  . docusate sodium  100 mg Oral BID  . doxepin  50 mg Oral QHS  . epoetin (EPOGEN/PROCRIT) injection  4,000 Units Intravenous Q T,Th,Sa-HD  . metoprolol tartrate  12.5 mg Oral BID  . mometasone-formoterol  2 puff Inhalation BID  . multivitamin  1 tablet Oral QHS  . nicotine  21 mg Transdermal Daily  . pantoprazole  40 mg Oral BID  . polyethylene glycol  17 g Oral Daily  . senna-docusate  2 tablet Oral BID  . sevelamer carbonate  1,600 mg Oral TID WC  . simvastatin  10 mg Oral QHS  . ziprasidone  40 mg Oral BID WC   acetaminophen, albuterol, alum & mag hydroxide-simeth, alum & mag hydroxide-simeth, ibuprofen, LORazepam, magnesium hydroxide, ondansetron  Assessment/ Plan:  55 y.o.AA  female with ESRD on HD since 1/16 followed  by [facility], COPD, secondary hyperparathyroidism, anemia of CKD, bipolar affective disorder, h./o gastric ulcers caused by NSAIDs  1. ESRD on HD TTS:  Seen and examined on dialysis.    2. Anemia of CKD: hemoglobin 10.0 Mircera as outpatient.  EPO with HD treatment   3. Secondary hyperparathyroidism of renal origin:  - continue sevelamer for binding.  - cinacalcet 60 mg daily w/meals  4. Hypertension: acceptable  control - metorpolol  5. Patient is requesting Pain meds - already getting ibuprofen - careful administration with food as patient has h/o NSAID induced ulcer EGD 11/30/15 - consider topical creams such as Bengay to minimize use of oral NSAIDs   LOS: 3 Jennifer Lawson 9/2/20174:20 PM

## 2016-01-09 NOTE — BHH Group Notes (Signed)
[place] LCSW Group Therapy  01/09/2016 4:43 PM  Type of Therapy:  Group Therapy  Participation Level:  None  Participation Quality:  Drowsy  Affect:  Lethargic  Cognitive:  Disorganized  Insight:  None  Engagement in Therapy:  None  Modes of Intervention:  Activity, Clarification, Discussion, Problem-solving and Support  Summary of Progress/Problems: Protective Factors: Patients defined protective factors and discussed the importance recognizing their personal strengths. Patients identified their own protective factors and resiliency. Patients discussed building upon those protective factors to improve their ability to cope with life challenges. Patient attended group but was asleep with for the duration of group.    Ohana Birdwell G. [place], [place] 01/09/2016, 4:53 PM

## 2016-01-09 NOTE — Progress Notes (Signed)
POST DIALYSIS ASSESSMENT 

## 2016-01-09 NOTE — Progress Notes (Signed)
Patient denies suicidal or homicidal ideations and AV hallucinations.Patient is asking that is there any way she can call the police because her pay cheque is stolen.Compliant with meds.No aggressive behaviors noted.Patient is taken to dialysis.

## 2016-01-09 NOTE — BHH Group Notes (Signed)
[place] Group Notes:  (Nursing/MHT/Case Management/Adjunct)  Date:  01/09/2016  Time:  5:45 AM  Type of Therapy:  Psychoeducational Skills  Participation Level:  None  Participation Quality:  Inattentive  Affect:  Flat  Cognitive:  Lacking  Insight:  None  Engagement in Group:  None  Modes of Intervention:  Discussion, Socialization and Support  Summary of Progress/Problems:  Reece Agar 01/09/2016, 5:45 AM

## 2016-01-09 NOTE — Progress Notes (Signed)
Hd started  

## 2016-01-09 NOTE — Progress Notes (Signed)
Pre dialysis assessment 

## 2016-01-10 LAB — HEPATITIS B SURFACE ANTIGEN: Hepatitis B Surface Ag: NEGATIVE

## 2016-01-10 NOTE — Progress Notes (Signed)
Patient hostile, angry, irritable, demanding, verbally aggressive, argumentative, unwilling to participate and uncooperative. She was demanding while taking her medications. She asked for several other medications that were not due or on the [facility] and demanded to get oxygen because she could not breathe. Patient on falls precautions and using a wheelchair to get around. No falls noted. She remains irritable. Refused to answer questions. Will continue to monitor to ensure safety.

## 2016-01-10 NOTE — BHH Group Notes (Signed)
[place] Group Notes:  (Nursing/MHT/Case Management/Adjunct)  Date:  01/10/2016  Time:  5:38 AM  Type of Therapy:  Psychoeducational Skills  Participation Level:  Did Not Attend  Summary of Progress/Problems:  Reece Agar 01/10/2016, 5:38 AM

## 2016-01-10 NOTE — Plan of Care (Signed)
Problem: Safety: Goal: Ability to redirect hostility and anger into socially appropriate behaviors will improve Outcome: Not Progressing Pt not redirectable when hostile [place]

## 2016-01-10 NOTE — Plan of Care (Signed)
Problem: Health Behavior/Discharge Planning: Goal: Ability to manage health-related needs will improve Outcome: Progressing Med compliant

## 2016-01-10 NOTE — BHH Group Notes (Signed)
[place] LCSW Group Therapy  01/10/2016 2:51 PM  Type of Therapy:  Group Therapy  Participation Level:  None  Participation Quality:  Attentive  Affect:  Blunted  Cognitive:  Disorganized  Insight:  None  Engagement in Therapy:  None  Modes of Intervention:  Activity, Discussion and Support  Summary of Progress/Problems:Coping Skills: Patients defined and discussed healthy coping skills. Patients identified healthy coping skills they would like to try during hospitalization and after discharge. CSW offered insight to varying coping skills that may have been new to patients such as practicing mindfulness. Patient attended group on this date but did not participate in the group discussion. Patient needed some redirection for asking other patients inappropriate questions such as "Who else here is pregnant, Yall want to see me get dolled up huh"?. Patient was redirectable with prompting. Patient left group and did not return.   Jalonda Antigua G. [place], [place] 01/10/2016, 2:53 PM

## 2016-01-10 NOTE — Progress Notes (Signed)
D: Patient is alert and oriented on the unit this shift. Patient not attend  and actively participated in groups today. Patient denies suicidal ideation, homicidal ideation, auditory or visual hallucinations at the present time.  A: Scheduled medications are administered to patient as per MD orders. Emotional support and encouragement are provided. Patient is maintained on q.15 minute safety checks. Patient is informed to notify staff with questions or concerns. R: No adverse medication reactions are noted. Patient is cooperative with medication administration   Patient is non receptive,  Anxious angry  and sometimes cooperative on the unit at this time. Patient does not  Interact  with others on the unit this shift. Patient contracts for safety at this time. Patient remains safe at this time.

## 2016-01-10 NOTE — Plan of Care (Signed)
Problem: Safety: Goal: Ability to redirect hostility and anger into socially appropriate behaviors will improve Outcome: Progressing Pt able to be redirected when angry [place] RN

## 2016-01-10 NOTE — Progress Notes (Signed)
D: Patient is alert and oriented on the unit this shift. Patient attended  in groups today. Patient denies suicidal ideation, homicidal ideation, auditory or visual hallucinations at the present time.  A: Scheduled medications are administered to patient as per MD orders. Emotional support and encouragement are provided. Patient is maintained on q.15 minute safety checks. Patient is informed to notify staff with questions or concerns. R: No adverse medication reactions are noted. Patient is  cooperative with medication administration and treatment plan today. Patient is noon receptive, irritable and not cooperative on the unit at this time. Patient does not interact  with others on the unit this shift. Patient contracts for safety at this time. Patient remains safe at this time.

## 2016-01-10 NOTE — Progress Notes (Signed)
[facility] MD Progress Note  01/10/2016 9:33 AM Jennifer Lawson  MRN:  [id number]  Subjective:  Jennifer Lawson is a 55 year old female with a history of bipolar disorder and and and kidney disease on dialysis admitted for psychotic agitation and aggressive behavior.  The patient was somewhat argumentative with this writer this morning. For almost all questions asked, she would state "why don't you go and ask Dr. Mamie Nick". The patient would repeatedly asked this writer for a cup of coffee. She denies any current active or passive suicidal thoughts or hallucinations but does continue to have paranoid and delusional thoughts that others are taking her money and in particular family members. She did endorse a lot of anger towards her daughter and did not want to speak at length about family conflicts. Mood however remains depressed and affect was irritable. She has been using the wheelchair and walker on the unit and is not ambulating independently. She has not been attending groups but does interact with peers fairly well. She completed dialysis yesterday without any complications. He denies any new somatic complaints. She slept over 5 hours last night and vital signs are stable.   Principal Problem: Bipolar affective disorder, current episode manic with psychotic symptoms ([place]) Diagnosis:   Patient Active Problem List   Diagnosis Date Noted  . Bipolar affective disorder, manic, severe, with psychotic behavior ([place]) [F31.2] 01/06/2016  . HCAP (healthcare-associated pneumonia) [J18.9] 01/01/2016  . Acute blood loss anemia [D62] 11/25/2015  . GI bleed [K92.2] 11/24/2015  . Benign essential HTN [I10] 11/24/2015  . Physical deconditioning [R53.81]   . Asthma [J45.909] 08/06/2015  . Cerebrovascular disease [I67.9] 07/23/2015  . Dyslipidemia [E78.5] 07/14/2015  . Bipolar affective disorder, current episode manic with psychotic symptoms ([place]) [F31.2] 07/13/2015  . Anemia of renal disease [D63.1] 01/26/2015  . CKD  (chronic kidney disease) stage V requiring chronic dialysis ([place]) [N18.6, Z99.2] 05/13/2014  . COPD (chronic obstructive pulmonary disease) ([place]) [J44.9] 09/26/2013  . Hyperparathyroidism, primary ([place]) [E21.0] 02/20/2013  . Tobacco use disorder [F17.200] 11/13/2012   Total Time spent with patient: 20 minutes  Past Psychiatric History: Bipolar disorder.  Past Medical History:  Past Medical History:  Diagnosis Date  . Anemia of renal disease   . Asthma   . Bipolar 1 disorder ([place])   . Chronic kidney disease    06/11/13- not on dialysis  . Complication of anesthesia    difficulty going to sleep  . COPD (chronic obstructive pulmonary disease) ([place])   . Depression   . Diabetes mellitus without complication ([place])    denies  . GERD (gastroesophageal reflux disease)   . Heart murmur   . History of blood transfusion   . Hypertension   . Mental disorder   . Overdose   . PTSD (post-traumatic stress disorder)   . Seizures ([place])    "passsed out"  . Shortness of breath    lying down flat  . Tobacco use disorder 11/13/2012    Past Surgical History:  Procedure Laterality Date  . AV FISTULA PLACEMENT Right 06/12/2013   Procedure: ARTERIOVENOUS (AV) FISTULA CREATION; RIGHT  BASILIC VEIN TRANSPOSITION with Intraoperative ultrasound;  Surgeon: Mal [name], MD;  Location: [place];  Service: Vascular;  Laterality: Right;  . COLONOSCOPY N/A 11/30/2015   Procedure: COLONOSCOPY;  Surgeon: Wilford Corner, MD;  Location: [facility] ENDOSCOPY;  Service: Endoscopy;  Laterality: N/A;  . ESOPHAGOGASTRODUODENOSCOPY Left 11/14/2012   Procedure: ESOPHAGOGASTRODUODENOSCOPY (EGD);  Surgeon: [name], MD;  Location: [facility] ENDOSCOPY;  Service: Endoscopy;  Laterality: Left;  . ESOPHAGOGASTRODUODENOSCOPY N/A 11/30/2015   Procedure: ESOPHAGOGASTRODUODENOSCOPY (EGD);  Surgeon: Wilford Corner, MD;  Location: [facility] ENDOSCOPY;  Service: Endoscopy;  Laterality: N/A;  . JOINT REPLACEMENT Right    knee  . PARATHYROIDECTOMY    .  Right knee replacement     she says it was last year.   Family History:  Family History  Problem Relation Age of Onset  . Diabetes Mother   . Hyperlipidemia Mother   . Hypertension Mother   . Diabetes Father   . Hypertension Father   . Hyperlipidemia Father    Family Psychiatric  History: See H&P. Social History:  History  Alcohol Use  . Yes    Comment: "I like a cold beer every once in a while"      History  Drug Use No    Comment: pt denied any drug use    Social History   Social History  . Marital status: Divorced    Spouse name: N/A  . Number of children: N/A  . Years of education: N/A   Social History Main Topics  . Smoking status: Current Every Day Smoker    Packs/day: 2.00    Years: 40.00    Types: Cigarettes, Cigars  . Smokeless tobacco: Never Used  . Alcohol use Yes     Comment: "I like a cold beer every once in a while"   . Drug use: No     Comment: pt denied any drug use  . Sexual activity: No   Other Topics Concern  . None   Social History Narrative  . None      Sleep: Fair  Appetite:  Fair  Current Medications: Current Facility-Administered Medications  Medication Dose Route Frequency Provider Last Rate Last Dose  . acetaminophen (TYLENOL) tablet 650 mg  650 mg Oral Q6H PRN Jolanta B Pucilowska, MD      . albuterol (PROVENTIL HFA;VENTOLIN HFA) 108 (90 Base) MCG/ACT inhaler 2 puff  2 puff Inhalation Q6H PRN Clovis Fredrickson, MD   2 puff at 01/10/16 0838  . alum & mag hydroxide-simeth (MAALOX/MYLANTA) 200-200-20 MG/5ML suspension 30 mL  30 mL Oral PRN Jolanta B Pucilowska, MD      . alum & mag hydroxide-simeth (MAALOX/MYLANTA) 200-200-20 MG/5ML suspension 30 mL  30 mL Oral Q4H PRN Jolanta B Pucilowska, MD      . cinacalcet (SENSIPAR) tablet 60 mg  60 mg Oral Q supper Clovis Fredrickson, MD   60 mg at 01/09/16 1834  . divalproex (DEPAKOTE ER) 24 hr tablet 1,000 mg  1,000 mg Oral BID Clovis Fredrickson, MD   1,000 mg at 01/10/16 0835   . docusate sodium (COLACE) capsule 100 mg  100 mg Oral BID Clovis Fredrickson, MD   100 mg at 01/10/16 0834  . doxepin (SINEQUAN) capsule 50 mg  50 mg Oral QHS Jolanta B Pucilowska, MD   50 mg at 01/09/16 1954  . epoetin alfa (EPOGEN,PROCRIT) injection 4,000 Units  4,000 Units Intravenous Q T,Th,Sa-HD Murlean Iba, MD   4,000 Units at 01/09/16 1524  . ibuprofen (ADVIL,MOTRIN) tablet 600 mg  600 mg Oral Q8H PRN Jolanta B Pucilowska, MD      . LORazepam (ATIVAN) tablet 2 mg  2 mg Oral Q6H PRN Gonzella Lex, MD   2 mg at 01/10/16 0012  . magnesium hydroxide (MILK OF MAGNESIA) suspension 30 mL  30 mL Oral Daily PRN Jolanta B Pucilowska, MD      . metoprolol tartrate (  LOPRESSOR) tablet 12.5 mg  12.5 mg Oral BID Clovis Fredrickson, MD   12.5 mg at 01/10/16 0835  . mometasone-formoterol (DULERA) 100-5 MCG/ACT inhaler 2 puff  2 puff Inhalation BID Clovis Fredrickson, MD   2 puff at 01/09/16 1954  . multivitamin (RENA-VIT) tablet 1 tablet  1 tablet Oral QHS Clovis Fredrickson, MD   1 tablet at 01/09/16 1954  . MUSCLE RUB CREA   Topical BID PRN Murlean Iba, MD      . nicotine (NICODERM CQ - dosed in mg/24 hours) patch 21 mg  21 mg Transdermal Daily Jolanta B Pucilowska, MD   21 mg at 01/10/16 0838  . ondansetron (ZOFRAN) tablet 4 mg  4 mg Oral Q8H PRN Jolanta B Pucilowska, MD      . pantoprazole (PROTONIX) EC tablet 40 mg  40 mg Oral BID Clovis Fredrickson, MD   40 mg at 01/10/16 0835  . polyethylene glycol (MIRALAX / GLYCOLAX) packet 17 g  17 g Oral Daily Jolanta B Pucilowska, MD   17 g at 01/10/16 0834  . senna-docusate (Senokot-S) tablet 2 tablet  2 tablet Oral BID Clovis Fredrickson, MD   2 tablet at 01/10/16 0834  . sevelamer carbonate (RENVELA) tablet 1,600 mg  1,600 mg Oral TID WC Jolanta B Pucilowska, MD   1,600 mg at 01/10/16 0834  . simvastatin (ZOCOR) tablet 10 mg  10 mg Oral QHS Clovis Fredrickson, MD   10 mg at 01/09/16 1955  . ziprasidone (GEODON) capsule 40 mg  40 mg Oral  BID WC Clovis Fredrickson, MD   40 mg at 01/10/16 [id number]    Lab Results:  Results for orders placed or performed during the hospital encounter of 01/06/16 (from the past 48 hour(s))  Hepatitis B surface antigen     Status: None   Collection Time: 01/09/16  3:13 PM  Result Value Ref Range   Hepatitis B Surface Ag Negative Negative    Comment: (NOTE) Performed At: [facility] [address] [place], [place] [id number] Lindon Romp MD [id number]     Blood Alcohol level:  Lab Results  Component Value Date   [facility] <5 01/04/2016   ETH <5 [id number]    Metabolic Disorder Labs: Lab Results  Component Value Date   HGBA1C 4.4 (L) 01/02/2016   MPG 80 01/02/2016   MPG 97 05/14/2014   Lab Results  Component Value Date   PROLACTIN 126.4 (H) 06/03/2015   Lab Results  Component Value Date   CHOL 184 06/03/2015   TRIG 102 06/03/2015   HDL 65 06/03/2015   CHOLHDL 2.8 06/03/2015   VLDL 20 06/03/2015   LDLCALC 99 06/03/2015   LDLCALC 99 01/09/2015    Physical Findings: AIMS: Facial and Oral Movements Muscles of Facial Expression: None, normal Lips and Perioral Area: None, normal Jaw: None, normal Tongue: None, normal,Extremity Movements Upper (arms, wrists, hands, fingers): None, normal Lower (legs, knees, ankles, toes): None, normal, Trunk Movements Neck, shoulders, hips: None, normal, Overall Severity Severity of abnormal movements (highest score from questions above): None, normal Incapacitation due to abnormal movements: None, normal Patient's awareness of abnormal movements (rate only patient's report): No Awareness, Dental Status Current problems with teeth and/or dentures?: No Does patient usually wear dentures?: No  CIWA:    COWS:     Musculoskeletal: Strength & Muscle Tone: within normal limits Gait & Station: unsteady Patient leans: N/A  Psychiatric Specialty Exam: Physical Exam  Nursing note and vitals reviewed.  Review of Systems   Constitutional: Negative.  Negative for chills, fever, malaise/fatigue and weight loss.  HENT: Negative.  Negative for congestion, ear discharge, ear pain, hearing loss, nosebleeds, sore throat and tinnitus.   Eyes: Negative.  Negative for blurred vision, double vision and photophobia.  Respiratory: Negative.  Negative for cough, sputum production, shortness of breath, wheezing and stridor.   Cardiovascular: Positive for leg swelling. Negative for chest pain and palpitations.  Gastrointestinal: Negative.  Negative for abdominal pain, constipation, diarrhea, heartburn, nausea and vomiting.  Genitourinary: Negative.  Negative for dysuria, frequency and urgency.  Musculoskeletal:       The patient complaints of muscle aches and pains "everywhere".  Skin: Negative.  Negative for itching and rash.  Neurological: Negative for dizziness, tingling, tremors, sensory change, focal weakness, seizures, loss of consciousness and headaches.       The patient does have difficulty with ambulation and uses a walker.  Endo/Heme/Allergies: Does not bruise/bleed easily.    Blood pressure 133/87, pulse 96, temperature 98 F (36.7 C), temperature source Oral, resp. rate 18, weight 77.1 kg (169 lb 15.6 oz), last menstrual period 05/09/2008, SpO2 98 %.Body mass index is 31.09 kg/m.  General Appearance: Casual  Eye Contact:  Good  Speech:  Clear and Coherent  Volume:  Normal  Mood:  Depressed  Affect:  Irritable (baseline)  Thought Process:  Goal Directed  Orientation:  Full (Time, Place, and Person)  Thought Content:  WDL  Suicidal Thoughts:  No  Homicidal Thoughts:  No  Memory:  Immediate;   Fair Recent;   Fair Remote;   Fair  Judgement:  Poor  Insight:  Shallow  Psychomotor Activity:  Decreased  Concentration:  Concentration: Fair and Attention Span: Fair  Recall:  [employer] of Knowledge:  Fair  Language:  Fair  Akathisia:  No  Handed:  Right  AIMS (if indicated):     Assets:  Communication  Skills Desire for Improvement Financial Resources/Insurance Housing Resilience Social Support  ADL's:  Intact  Cognition:  WNL  Sleep:  Number of Hours: 5.3     Treatment Plan Summary: Daily contact with patient to assess and evaluate symptoms and progress in treatment and Medication management   Ms. Plummer is a 55 year old female with history of bipolar disorder and end-stage kidney disease on dialysis admitted for a mixed episode,  aggressive behavior  and poor treatment compliance.   1. Bipolar mania. We continue Depakote ER 1000 mg bid and Geodon 40 mg bid for psychosis and mood stabilization. VPA 64. EKG did not show any QTc prolongation  2. Kidney disease. Nephrology consult is greatly appreciated. The patient will continue dialysis.  3. COPD. We continued inhalers, Albuterol and Dulera. No respiratory distress  4. Hypertension. She is on metoprolol.   5. GERD. She is on Protonix.  6. Smoking. Nicotine patch is available.  7. Insomnia. We continued Doxepin.   8. Metabolic syndrome. Lipid panel, HgbA1C, TSH and PRL were done during prevoius admission.  9. Dyslipidemia. She is on Zocor.   10. Constipation. She is on Colace.   11. Chronic pain. The patient abuses all types of over-the-counter pain medication.  12. EKG. Normal sinus rhythm. QT 397.  13. Disposition. She will be discharged to home with her daughter. She will follow up with Dr. Rosine Door for medication management and her nephrologist for dialysis.    Jay Schlichter, MD 01/10/2016, 9:33 AM

## 2016-01-11 NOTE — Progress Notes (Signed)
Patient alert , but agitated and struggles with anger, patient answers sarcastically when asked questions, Patient states that she would like to go home, and she has children to help her she states, Patient has good appetite, she does take medications with some coaxing, she is a dialysis patient and has depression, denies Si/Hi or avh at this time, patient with q 100min. Checks.

## 2016-01-11 NOTE — Plan of Care (Signed)
Problem: Safety: Goal: Ability to remain free from injury will improve Outcome: Progressing Goal : Patient is to remain free from injury, she is monitored and assisted if needed, Patient can maneuver around in w/c and can get in and out of bed per self. Patient will be safe and use caution with movement, and will voice safety tools such as calling for assist if needed, and nonskid socks, and taking her time to reposition.

## 2016-01-11 NOTE — [facility] Group Notes (Signed)
[place] Group Notes:  (Nursing/MHT/Case Management/Adjunct)  Date:  01/11/2016  Time:  9:58 PM  Type of Therapy:  Evening Wrap-up Group  Participation Level:  Did Not Attend  Participation Quality:  N/A  Affect:  N/A  Cognitive:  N/A  Insight:  None  Engagement in Group:  Did Not Attend  Modes of Intervention:  Activity  Summary of Progress/Problems:  Levonne Spiller 01/11/2016, 9:58 PM

## 2016-01-11 NOTE — [facility] Group Notes (Signed)
[facility] LCSW Group Therapy  01/11/2016 3:42 PM  Participation Level:  Minimal  Description of Group:    In this group patients will be encouraged to explore what they see as obstacles to their own wellness and recovery. They will be guided to discuss their thoughts, feelings, and behaviors related to these obstacles. The group will process together ways to cope with barriers, with attention given to specific choices patients can make. Each patient will be challenged to identify changes they are motivated to make in order to overcome their obstacles. This group will be process-oriented, with patients participating in exploration of their own experiences as well as giving and receiving support and challenge from other group members.  Therapeutic Goals: 1. Patient will identify personal and current obstacles as they relate to admission. 2. Patient will identify barriers that currently interfere with their wellness or overcoming obstacles.  3. Patient will identify feelings, thought process and behaviors related to these barriers. 4. Patient will identify two changes they are willing to make to overcome these obstacles:    Summary of Patient Progress Pt did not stay in group for very long.  She was in and out but did share she felt her faith gave her strength and purpose.  Pt  unable to meet therapeutic goals.  Group discussed qualities of resilience or persistence that could benefit recovery efforts and overcoming the obstacles faced.  Therapeutic Modalities:   Cognitive Behavioral Therapy Solution Focused Therapy Motivational Interviewing Relapse Prevention Therapy  August Saucer, MSW, LCSW 01/11/2016, 3:42 PM

## 2016-01-11 NOTE — Progress Notes (Signed)
[facility] MD Progress Note  01/11/2016 12:39 PM Jennifer Lawson  MRN:  [id number]  Subjective:  Ms. Niskanen is a 55 year old female with a history of bipolar disorder and and and kidney disease on dialysis admitted for psychotic agitation and aggressive behavior.  Follow-up for Monday the fourth. Patient is of course wanting to be discharged. Her insight remains partial at best. She is still intrusive and a little argumentative but not as agitated I think that she had been. She denies any suicidal thoughts. She is not frankly bizarre. She has been tolerating her medicine well.  Principal Problem: Bipolar affective disorder, current episode manic with psychotic symptoms ([place]) Diagnosis:   Patient Active Problem List   Diagnosis Date Noted  . Bipolar affective disorder, manic, severe, with psychotic behavior ([place]) [F31.2] 01/06/2016  . HCAP (healthcare-associated pneumonia) [J18.9] 01/01/2016  . Acute blood loss anemia [D62] 11/25/2015  . GI bleed [K92.2] 11/24/2015  . Benign essential HTN [I10] 11/24/2015  . Physical deconditioning [R53.81]   . Asthma [J45.909] 08/06/2015  . Cerebrovascular disease [I67.9] 07/23/2015  . Dyslipidemia [E78.5] 07/14/2015  . Bipolar affective disorder, current episode manic with psychotic symptoms ([place]) [F31.2] 07/13/2015  . Anemia of renal disease [D63.1] 01/26/2015  . CKD (chronic kidney disease) stage V requiring chronic dialysis ([place]) [N18.6, Z99.2] 05/13/2014  . COPD (chronic obstructive pulmonary disease) ([place]) [J44.9] 09/26/2013  . Hyperparathyroidism, primary ([place]) [E21.0] 02/20/2013  . Tobacco use disorder [F17.200] 11/13/2012   Total Time spent with patient: 20 minutes  Past Psychiatric History: Bipolar disorder.  Past Medical History:  Past Medical History:  Diagnosis Date  . Anemia of renal disease   . Asthma   . Bipolar 1 disorder ([place])   . Chronic kidney disease    06/11/13- not on dialysis  . Complication of anesthesia    difficulty  going to sleep  . COPD (chronic obstructive pulmonary disease) ([place])   . Depression   . Diabetes mellitus without complication ([place])    denies  . GERD (gastroesophageal reflux disease)   . Heart murmur   . History of blood transfusion   . Hypertension   . Mental disorder   . Overdose   . PTSD (post-traumatic stress disorder)   . Seizures ([place])    "passsed out"  . Shortness of breath    lying down flat  . Tobacco use disorder 11/13/2012    Past Surgical History:  Procedure Laterality Date  . AV FISTULA PLACEMENT Right 06/12/2013   Procedure: ARTERIOVENOUS (AV) FISTULA CREATION; RIGHT  BASILIC VEIN TRANSPOSITION with Intraoperative ultrasound;  Surgeon: Mal [name], MD;  Location: [facility];  Service: Vascular;  Laterality: Right;  . COLONOSCOPY N/A 11/30/2015   Procedure: COLONOSCOPY;  Surgeon: Wilford Corner, MD;  Location: [facility] ENDOSCOPY;  Service: Endoscopy;  Laterality: N/A;  . ESOPHAGOGASTRODUODENOSCOPY Left 11/14/2012   Procedure: ESOPHAGOGASTRODUODENOSCOPY (EGD);  Surgeon: [name], MD;  Location: [facility] ENDOSCOPY;  Service: Endoscopy;  Laterality: Left;  . ESOPHAGOGASTRODUODENOSCOPY N/A 11/30/2015   Procedure: ESOPHAGOGASTRODUODENOSCOPY (EGD);  Surgeon: Wilford Corner, MD;  Location: [facility] ENDOSCOPY;  Service: Endoscopy;  Laterality: N/A;  . JOINT REPLACEMENT Right    knee  . PARATHYROIDECTOMY    . Right knee replacement     she says it was last year.   Family History:  Family History  Problem Relation Age of Onset  . Diabetes Mother   . Hyperlipidemia Mother   . Hypertension Mother   . Diabetes Father   . Hypertension Father   . Hyperlipidemia Father  Family Psychiatric  History: See H&P. Social History:  History  Alcohol Use  . Yes    Comment: "I like a cold beer every once in a while"      History  Drug Use No    Comment: pt denied any drug use    Social History   Social History  . Marital status: Divorced    Spouse name: N/A  . Number of children: N/A  .  Years of education: N/A   Social History Main Topics  . Smoking status: Current Every Day Smoker    Packs/day: 2.00    Years: 40.00    Types: Cigarettes, Cigars  . Smokeless tobacco: Never Used  . Alcohol use Yes     Comment: "I like a cold beer every once in a while"   . Drug use: No     Comment: pt denied any drug use  . Sexual activity: No   Other Topics Concern  . None   Social History Narrative  . None      Sleep: Fair  Appetite:  Fair  Current Medications: Current Facility-Administered Medications  Medication Dose Route Frequency Provider Last Rate Last Dose  . acetaminophen (TYLENOL) tablet 650 mg  650 mg Oral Q6H PRN Jolanta B Pucilowska, MD      . albuterol (PROVENTIL HFA;VENTOLIN HFA) 108 (90 Base) MCG/ACT inhaler 2 puff  2 puff Inhalation Q6H PRN Clovis Fredrickson, MD   2 puff at 01/10/16 0838  . alum & mag hydroxide-simeth (MAALOX/MYLANTA) 200-200-20 MG/5ML suspension 30 mL  30 mL Oral PRN Jolanta B Pucilowska, MD      . alum & mag hydroxide-simeth (MAALOX/MYLANTA) 200-200-20 MG/5ML suspension 30 mL  30 mL Oral Q4H PRN Jolanta B Pucilowska, MD      . cinacalcet (SENSIPAR) tablet 60 mg  60 mg Oral Q supper Clovis Fredrickson, MD   60 mg at 01/10/16 1642  . divalproex (DEPAKOTE ER) 24 hr tablet 1,000 mg  1,000 mg Oral BID Clovis Fredrickson, MD   1,000 mg at 01/11/16 0847  . docusate sodium (COLACE) capsule 100 mg  100 mg Oral BID Clovis Fredrickson, MD   100 mg at 01/11/16 0855  . doxepin (SINEQUAN) capsule 50 mg  50 mg Oral QHS Jolanta B Pucilowska, MD   50 mg at 01/10/16 2101  . epoetin alfa (EPOGEN,PROCRIT) injection 4,000 Units  4,000 Units Intravenous Q T,Th,Sa-HD Murlean Iba, MD   4,000 Units at 01/09/16 1524  . ibuprofen (ADVIL,MOTRIN) tablet 600 mg  600 mg Oral Q8H PRN Jolanta B Pucilowska, MD   600 mg at 01/10/16 1642  . LORazepam (ATIVAN) tablet 2 mg  2 mg Oral Q6H PRN Gonzella Lex, MD   2 mg at 01/11/16 0549  . magnesium hydroxide (MILK OF  MAGNESIA) suspension 30 mL  30 mL Oral Daily PRN Jolanta B Pucilowska, MD      . metoprolol tartrate (LOPRESSOR) tablet 12.5 mg  12.5 mg Oral BID Jolanta B Pucilowska, MD   12.5 mg at 01/11/16 0855  . mometasone-formoterol (DULERA) 100-5 MCG/ACT inhaler 2 puff  2 puff Inhalation BID Clovis Fredrickson, MD   2 puff at 01/11/16 0858  . multivitamin (RENA-VIT) tablet 1 tablet  1 tablet Oral QHS Clovis Fredrickson, MD   1 tablet at 01/10/16 2101  . MUSCLE RUB CREA   Topical BID PRN Harmeet Singh, MD      . nicotine (NICODERM CQ - dosed in mg/24 hours) patch 21 mg  21 mg Transdermal Daily Clovis Fredrickson, MD   21 mg at 01/11/16 0855  . ondansetron (ZOFRAN) tablet 4 mg  4 mg Oral Q8H PRN Jolanta B Pucilowska, MD      . pantoprazole (PROTONIX) EC tablet 40 mg  40 mg Oral BID Clovis Fredrickson, MD   40 mg at 01/11/16 0847  . polyethylene glycol (MIRALAX / GLYCOLAX) packet 17 g  17 g Oral Daily Jolanta B Pucilowska, MD   17 g at 01/10/16 0834  . senna-docusate (Senokot-S) tablet 2 tablet  2 tablet Oral BID Clovis Fredrickson, MD   2 tablet at 01/11/16 0847  . sevelamer carbonate (RENVELA) tablet 1,600 mg  1,600 mg Oral TID WC Jolanta B Pucilowska, MD   1,600 mg at 01/11/16 1221  . simvastatin (ZOCOR) tablet 10 mg  10 mg Oral QHS Jolanta B Pucilowska, MD   10 mg at 01/10/16 2200  . ziprasidone (GEODON) capsule 40 mg  40 mg Oral BID WC Clovis Fredrickson, MD   40 mg at 01/11/16 0848    Lab Results:  Results for orders placed or performed during the hospital encounter of 01/06/16 (from the past 48 hour(s))  Hepatitis B surface antigen     Status: None   Collection Time: 01/09/16  3:13 PM  Result Value Ref Range   Hepatitis B Surface Ag Negative Negative    Comment: (NOTE) Performed At: [facility] [address] [place], [place] [id number] Lindon Romp MD [id number]     Blood Alcohol level:  Lab Results  Component Value Date   [facility] <5 01/04/2016   ETH <5 [id number]     Metabolic Disorder Labs: Lab Results  Component Value Date   HGBA1C 4.4 (L) 01/02/2016   MPG 80 01/02/2016   MPG 97 05/14/2014   Lab Results  Component Value Date   PROLACTIN 126.4 (H) 06/03/2015   Lab Results  Component Value Date   CHOL 184 06/03/2015   TRIG 102 06/03/2015   HDL 65 06/03/2015   CHOLHDL 2.8 06/03/2015   VLDL 20 06/03/2015   LDLCALC 99 06/03/2015   LDLCALC 99 01/09/2015    Physical Findings: AIMS: Facial and Oral Movements Muscles of Facial Expression: None, normal Lips and Perioral Area: None, normal Jaw: None, normal Tongue: None, normal,Extremity Movements Upper (arms, wrists, hands, fingers): None, normal Lower (legs, knees, ankles, toes): None, normal, Trunk Movements Neck, shoulders, hips: None, normal, Overall Severity Severity of abnormal movements (highest score from questions above): None, normal Incapacitation due to abnormal movements: None, normal Patient's awareness of abnormal movements (rate only patient's report): No Awareness, Dental Status Current problems with teeth and/or dentures?: No Does patient usually wear dentures?: No  CIWA:    COWS:     Musculoskeletal: Strength & Muscle Tone: within normal limits Gait & Station: unsteady Patient leans: N/A  Psychiatric Specialty Exam: Physical Exam  Nursing note and vitals reviewed. Constitutional: She appears well-nourished.  HENT:  Head: Normocephalic and atraumatic.  Eyes: Conjunctivae are normal. Pupils are equal, round, and reactive to light.  Neck: Normal range of motion.  Cardiovascular: Normal heart sounds.   Respiratory: Effort normal.  GI: Soft.  Musculoskeletal: She exhibits edema.  Neurological: She is alert. She exhibits abnormal muscle tone. Coordination abnormal.  Skin: Skin is warm and dry.  Psychiatric: Her speech is normal. Her mood appears anxious. Her affect is labile. She is agitated. Thought content is paranoid. She expresses impulsivity. She expresses  no suicidal ideation. She exhibits abnormal  recent memory.    Review of Systems  Constitutional: Negative.  Negative for chills, fever, malaise/fatigue and weight loss.  HENT: Negative.  Negative for congestion, ear discharge, ear pain, hearing loss, nosebleeds, sore throat and tinnitus.   Eyes: Negative.  Negative for blurred vision, double vision and photophobia.  Respiratory: Negative.  Negative for cough, sputum production, shortness of breath, wheezing and stridor.   Cardiovascular: Positive for leg swelling. Negative for chest pain and palpitations.  Gastrointestinal: Negative.  Negative for abdominal pain, constipation, diarrhea, heartburn, nausea and vomiting.  Genitourinary: Negative.  Negative for dysuria, frequency and urgency.  Musculoskeletal: Negative.        The patient complaints of muscle aches and pains "everywhere".  Skin: Negative.  Negative for itching and rash.  Neurological: Negative.  Negative for dizziness, tingling, tremors, sensory change, focal weakness, seizures, loss of consciousness and headaches.       The patient does have difficulty with ambulation and uses a walker.  Endo/Heme/Allergies: Does not bruise/bleed easily.  Psychiatric/Behavioral: Negative for depression, hallucinations, memory loss, substance abuse and suicidal ideas. The patient is nervous/anxious. The patient does not have insomnia.     Blood pressure 126/84, pulse 97, temperature 98.3 F (36.8 C), resp. rate 18, weight 77.1 kg (169 lb 15.6 oz), last menstrual period 05/09/2008, SpO2 98 %.Body mass index is 31.09 kg/m.  General Appearance: Casual  Eye Contact:  Good  Speech:  Clear and Coherent  Volume:  Normal  Mood:  Depressed  Affect:  Irritable (baseline)  Thought Process:  Goal Directed  Orientation:  Full (Time, Place, and Person)  Thought Content:  WDL  Suicidal Thoughts:  No  Homicidal Thoughts:  No  Memory:  Immediate;   Fair Recent;   Fair Remote;   Fair  Judgement:  Poor   Insight:  Shallow  Psychomotor Activity:  Decreased  Concentration:  Concentration: Fair and Attention Span: Fair  Recall:  [employer] of Knowledge:  Fair  Language:  Fair  Akathisia:  No  Handed:  Right  AIMS (if indicated):     Assets:  Communication Skills Desire for Improvement Financial Resources/Insurance Housing Resilience Social Support  ADL's:  Intact  Cognition:  WNL  Sleep:  Number of Hours: 6     Treatment Plan Summary: Daily contact with patient to assess and evaluate symptoms and progress in treatment and Medication management   Ms. Kromer is a 55 year old female with history of bipolar disorder and end-stage kidney disease on dialysis admitted for a mixed episode,  aggressive behavior  and poor treatment compliance.   1. Bipolar mania. We continue Depakote ER 1000 mg bid and Geodon 40 mg bid for psychosis and mood stabilization. VPA 64. EKG did not show any QTc prolongation  2. Kidney disease. Nephrology consult is greatly appreciated. The patient will continue dialysis.  3. COPD. We continued inhalers, Albuterol and Dulera. No respiratory distress  4. Hypertension. She is on metoprolol.   5. GERD. She is on Protonix.  6. Smoking. Nicotine patch is available.  7. Insomnia. We continued Doxepin.   8. Metabolic syndrome. Lipid panel, HgbA1C, TSH and PRL were done during prevoius admission.  9. Dyslipidemia. She is on Zocor.   10. Constipation. She is on Colace.   11. Chronic pain. The patient abuses all types of over-the-counter pain medication.  12. EKG. Normal sinus rhythm. QT 397.  13. Disposition. She will be discharged to home with her daughter. She will follow up with Dr. Rosine Door for medication management  and her nephrologist for dialysis.  No change to medication today. Seems to be slightly better. I think we can check her Depakote level probably tomorrow before dialysis. She seems to be tolerating medicine well. Encouraged her  to continue being up and about and out of her room and participating in treatment.   Alethia Berthold, MD 01/11/2016, 12:39 PM

## 2016-01-11 NOTE — [facility] Group Notes (Signed)
[facility] Group Notes:  (Nursing/MHT/Case Management/Adjunct)  Date:  01/11/2016  Time:  5:36 AM  Type of Therapy:  Psychoeducational Skills  Participation Level:  Active  Participation Quality:  Appropriate  Affect:  Appropriate  Cognitive:  Appropriate  Insight:  Appropriate  Engagement in Group:  Engaged  Modes of Intervention:  Discussion, Socialization and Support  Summary of Progress/Problems:  Reece Agar 01/11/2016, 5:36 AM

## 2016-01-12 LAB — RENAL FUNCTION PANEL
Albumin: 2.9 g/dL — ABNORMAL LOW (ref 3.5–5.0)
Anion gap: 6 (ref 5–15)
BUN: 59 mg/dL — ABNORMAL HIGH (ref 6–20)
CO2: 26 mmol/L (ref 22–32)
Calcium: 8.6 mg/dL — ABNORMAL LOW (ref 8.9–10.3)
Chloride: 104 mmol/L (ref 101–111)
Creatinine, Ser: 5.1 mg/dL — ABNORMAL HIGH (ref 0.44–1.00)
GFR calc Af Amer: 10 mL/min — ABNORMAL LOW (ref >60)
GFR calc non Af Amer: 9 mL/min — ABNORMAL LOW (ref >60)
Glucose, Bld: 93 mg/dL (ref 65–99)
Phosphorus: 5.5 mg/dL — ABNORMAL HIGH (ref 2.5–4.6)
Potassium: 5.6 mmol/L — ABNORMAL HIGH (ref 3.5–5.1)
Sodium: 136 mmol/L (ref 135–145)

## 2016-01-12 LAB — CBC
HCT: 26.4 % — ABNORMAL LOW (ref 35.0–47.0)
Hemoglobin: 8.4 g/dL — ABNORMAL LOW (ref 12.0–16.0)
MCH: 31.2 pg (ref 26.0–34.0)
MCHC: 31.9 g/dL — ABNORMAL LOW (ref 32.0–36.0)
MCV: 97.6 fL (ref 80.0–100.0)
Platelets: 177 10*3/uL (ref 150–440)
RBC: 2.71 MIL/uL — ABNORMAL LOW (ref 3.80–5.20)
RDW: 19.7 % — ABNORMAL HIGH (ref 11.5–14.5)
WBC: 5.3 10*3/uL (ref 3.6–11.0)

## 2016-01-12 LAB — VALPROIC ACID LEVEL: Valproic Acid Lvl: 70 ug/mL (ref 50.0–100.0)

## 2016-01-12 MED ORDER — ZIPRASIDONE HCL 60 MG PO CAPS: 60.0000 mg | ORAL_CAPSULE | Freq: Two times a day (BID) | ORAL | 0 refills | Status: AC

## 2016-01-12 MED ORDER — HYDROXYZINE HCL 25 MG PO TABS: 25.0000 mg | ORAL_TABLET | Freq: Two times a day (BID) | ORAL | 0 refills | Status: AC | PRN

## 2016-01-12 MED ORDER — DIVALPROEX SODIUM ER 500 MG PO TB24: 1000.0000 mg | ORAL_TABLET | Freq: Two times a day (BID) | ORAL | 0 refills | Status: AC

## 2016-01-12 NOTE — Progress Notes (Signed)
Patient walked up to the nursing station without a walker and stated that she slid out of her wheelchair and sat on the floor. She said that she got on her knees and walked to the toilet and had a BM. The slip was unwitnessed. Patient was assessed after much encouragement (she had initially refused). VS obtained(BP=147/89, HR=90, 97%, Temp.=98.0, Resp.=18). No bruises or injuries noted. MD notified. Writer called her daughters but there was no response, no voicemail message left. Patient consistently knocking on the nursing station door demanding another tray and more food. She was also demanding to be discharged and demanding to make long distance calls even though she had made several calls. Patient also got into a verbal altercation with peer while she was at the nursing station door banging the door. She was cursing [employer], shut up whity, bitches, kiss my ass.") both peer and staff and had to be walked/redirected back to her room.  She was encouraged to use her walker/wheelchair and call bell. Patient not cooperative.

## 2016-01-12 NOTE — Progress Notes (Signed)
Post hd vitals 

## 2016-01-12 NOTE — Progress Notes (Signed)
D: Pt continues to display angry and argumentative behavior with staff regarding medication administration and desire for oxygen. She expresses a desire to go home d/t a check she believes she received on the first. Pt denies SI/HI/AVH at this time. A: Emotional support and encouragement provided. Medications administered with education. q15 minute safety checks maintained. R: Pt remains free from harm. Will continue to monitor.

## 2016-01-12 NOTE — Progress Notes (Signed)
Patient returned to the unit at 2:20pm. VS obtained (BP=136/97, HR=92, Resp. 18, O2 Sats=96%). Patient alert and oriented X3, states that she is tired. Patient enouraged to rest after eating her lunch.

## 2016-01-12 NOTE — [facility] Group Notes (Signed)
[facility] LCSW Group Therapy   01/12/2016 9:30am  Type of Therapy: Group Therapy   Participation Level: Active   Participation Quality: Attentive, Sharing and Supportive   Affect: Appropriate  Cognitive: Alert and Oriented   Insight: Developing/Improving and Engaged   Engagement in Therapy: Developing/Improving and Engaged   Modes of Intervention: Clarification, Confrontation, Discussion, Education, Exploration,  Limit-setting, Orientation, Problem-solving, Rapport Building, Art therapist, Socialization and Support  Summary of Progress/Problems: The topic for group therapy was feelings about diagnosis. Pt actively participated in group discussion on their past and current diagnosis and how they feel towards this. Pt also identified how society and family members judge them, based on their diagnosis as well as stereotypes and stigmas. Patient stated that she believes that if her family understood her diagnosis better, she would not have negative feelings around her diagnosis. She believes that using effective coping mechanisms would lessen her chance of being hospitalized again.     Glorious Peach, MSW, Latanya Presser

## 2016-01-12 NOTE — Progress Notes (Signed)
Pre Dialysis 

## 2016-01-12 NOTE — Progress Notes (Signed)
POST HD ASSESSMENT 

## 2016-01-12 NOTE — Progress Notes (Signed)
Dialysis started 

## 2016-01-12 NOTE — Plan of Care (Signed)
Problem: Safety: Goal: Ability to redirect hostility and anger into socially appropriate behaviors will improve Outcome: Not Progressing Pt verbally aggressive and demanding towards staff.  Problem: Safety: Goal: Periods of time without injury will increase Outcome: Progressing Pt remains free from harm. Uses wheelchair or walker to ambulate around the unit.

## 2016-01-12 NOTE — Plan of Care (Signed)
Problem: Health Behavior/Discharge Planning: Goal: Compliance with prescribed medication regimen will improve Outcome: Progressing Medication compliant with encouragement.

## 2016-01-12 NOTE — Progress Notes (Signed)
End of hd tx  

## 2016-01-12 NOTE — Tx Team (Signed)
Interdisciplinary Treatment and Diagnostic Plan Update  01/12/2016 Time of Session: 2pm Jennifer Lawson MRN: [id number]  Principal Diagnosis: Bipolar affective disorder, current episode manic with psychotic symptoms ([place])  Secondary Diagnoses: Principal Problem:   Bipolar affective disorder, current episode manic with psychotic symptoms ([place]) Active Problems:   Tobacco use disorder   CKD (chronic kidney disease) stage V requiring chronic dialysis ([place])   Anemia of renal disease   Dyslipidemia   Asthma   Benign essential HTN   Bipolar affective disorder, manic, severe, with psychotic behavior ([place])   Current Medications:  Current Facility-Administered Medications  Medication Dose Route Frequency Provider Last Rate Last Dose  . acetaminophen (TYLENOL) tablet 650 mg  650 mg Oral Q6H PRN Jolanta B Pucilowska, MD      . albuterol (PROVENTIL HFA;VENTOLIN HFA) 108 (90 Base) MCG/ACT inhaler 2 puff  2 puff Inhalation Q6H PRN Clovis Fredrickson, MD   2 puff at 01/11/16 1702  . alum & mag hydroxide-simeth (MAALOX/MYLANTA) 200-200-20 MG/5ML suspension 30 mL  30 mL Oral PRN Jolanta B Pucilowska, MD      . alum & mag hydroxide-simeth (MAALOX/MYLANTA) 200-200-20 MG/5ML suspension 30 mL  30 mL Oral Q4H PRN Jolanta B Pucilowska, MD      . cinacalcet (SENSIPAR) tablet 60 mg  60 mg Oral Q supper Clovis Fredrickson, MD   60 mg at 01/12/16 1702  . divalproex (DEPAKOTE ER) 24 hr tablet 1,000 mg  1,000 mg Oral BID Clovis Fredrickson, MD   1,000 mg at 01/12/16 1702  . docusate sodium (COLACE) capsule 100 mg  100 mg Oral BID Clovis Fredrickson, MD   100 mg at 01/12/16 1702  . doxepin (SINEQUAN) capsule 50 mg  50 mg Oral QHS Clovis Fredrickson, MD   50 mg at 01/11/16 2204  . epoetin alfa (EPOGEN,PROCRIT) injection 4,000 Units  4,000 Units Intravenous Q T,Th,Sa-HD Murlean Iba, MD   4,000 Units at 01/12/16 1056  . ibuprofen (ADVIL,MOTRIN) tablet 600 mg  600 mg Oral Q8H PRN Jolanta B Pucilowska, MD    600 mg at 01/12/16 1554  . LORazepam (ATIVAN) tablet 2 mg  2 mg Oral Q6H PRN Gonzella Lex, MD   2 mg at 01/12/16 1555  . magnesium hydroxide (MILK OF MAGNESIA) suspension 30 mL  30 mL Oral Daily PRN Jolanta B Pucilowska, MD      . metoprolol tartrate (LOPRESSOR) tablet 12.5 mg  12.5 mg Oral BID Jolanta B Pucilowska, MD   12.5 mg at 01/12/16 1702  . mometasone-formoterol (DULERA) 100-5 MCG/ACT inhaler 2 puff  2 puff Inhalation BID Clovis Fredrickson, MD   2 puff at 01/12/16 0949  . multivitamin (RENA-VIT) tablet 1 tablet  1 tablet Oral QHS Clovis Fredrickson, MD   1 tablet at 01/11/16 2204  . MUSCLE RUB CREA   Topical BID PRN Murlean Iba, MD      . nicotine (NICODERM CQ - dosed in mg/24 hours) patch 21 mg  21 mg Transdermal Daily Jolanta B Pucilowska, MD   21 mg at 01/12/16 0949  . ondansetron (ZOFRAN) tablet 4 mg  4 mg Oral Q8H PRN Jolanta B Pucilowska, MD      . pantoprazole (PROTONIX) EC tablet 40 mg  40 mg Oral BID Clovis Fredrickson, MD   40 mg at 01/12/16 1702  . polyethylene glycol (MIRALAX / GLYCOLAX) packet 17 g  17 g Oral Daily Jolanta B Pucilowska, MD   17 g at 01/12/16 0948  . senna-docusate (  Senokot-S) tablet 2 tablet  2 tablet Oral BID Clovis Fredrickson, MD   2 tablet at 01/12/16 1702  . sevelamer carbonate (RENVELA) tablet 1,600 mg  1,600 mg Oral TID WC Jolanta B Pucilowska, MD   1,600 mg at 01/12/16 1702  . simvastatin (ZOCOR) tablet 10 mg  10 mg Oral QHS Clovis Fredrickson, MD   10 mg at 01/11/16 2204  . ziprasidone (GEODON) capsule 40 mg  40 mg Oral BID WC Jolanta B Pucilowska, MD   40 mg at 01/12/16 1702   PTA Medications: Prescriptions Prior to Admission  Medication Sig Dispense Refill Last Dose  . albuterol (PROVENTIL HFA;VENTOLIN HFA) 108 (90 Base) MCG/ACT inhaler Inhale 2 puffs into the lungs every 6 (six) hours as needed for wheezing or shortness of breath. 1 Inhaler 0 Past Week at Unknown time  . albuterol (PROVENTIL) (2.5 MG/3ML) 0.083% nebulizer solution  Take 2.5 mg by nebulization every 6 (six) hours as needed for wheezing.   12/31/2015 at Unknown time  . B Complex-C (B-COMPLEX WITH VITAMIN C) tablet Take 1 tablet by mouth daily.   12/31/2015 at Unknown time  . b complex-vitamin c-folic acid (NEPHRO-VITE) 0.8 MG TABS tablet Take 1 tablet by mouth at bedtime. 30 tablet 0 01/01/2016 at Unknown time  . cinacalcet (SENSIPAR) 60 MG tablet Take 1 tablet (60 mg total) by mouth daily. Takes with largest meal once daily (Patient taking differently: Take 60 mg by mouth daily. ) 30 tablet 0 unknown at unknown  . Darbepoetin Alfa (ARANESP) 40 MCG/0.4ML SOSY injection Inject 0.4 mLs (40 mcg total) into the vein every Saturday with hemodialysis. 8.4 mL  Past Week at Unknown time  . divalproex (DEPAKOTE ER) 500 MG 24 hr tablet Take 2 tablets (1,000 mg total) by mouth 2 (two) times daily. 120 tablet 0   . Docusate Sodium (DSS) 100 MG CAPS Take 100 mg by mouth 2 (two) times daily. 60 each 0 Past Week at Unknown time  . doxepin (SINEQUAN) 50 MG capsule Take 1 capsule (50 mg total) by mouth at bedtime. (Patient taking differently: Take 50 mg by mouth at bedtime as needed (sleep). ) 30 capsule 0 Past Week at Unknown time  . furosemide (LASIX) 40 MG tablet Take 60 mg by mouth 2 (two) times daily.  0   . hydrOXYzine (ATARAX/VISTARIL) 25 MG tablet Take 25 mg by mouth 2 (two) times daily as needed for anxiety.     Marland Kitchen levofloxacin (LEVAQUIN) 250 MG tablet Take 1 tablet (250 mg total) by mouth every other day. 5 tablet 0   . LORazepam (ATIVAN) 0.5 MG tablet Take 1 tablet (0.5 mg total) by mouth 3 (three) times daily. 90 tablet 0 Past Week at Unknown time  . Melatonin 5 MG TABS Take 5 mg by mouth at bedtime.     . metoprolol tartrate (LOPRESSOR) 25 MG tablet Take 0.5 tablets (12.5 mg total) by mouth 2 (two) times daily.   01/01/2016 at [place]  . mometasone-formoterol (DULERA) 100-5 MCG/ACT AERO Inhale 2 puffs into the lungs 2 (two) times daily. 1 Inhaler 0 12/31/2015 at Unknown time  .  nicotine (NICODERM CQ - DOSED IN MG/24 HOURS) 21 mg/24hr patch Place 1 patch (21 mg total) onto the skin daily. 28 patch 0   . ondansetron (ZOFRAN-ODT) 4 MG disintegrating tablet Take 4 mg by mouth every 6 (six) hours as needed for nausea or vomiting.   Past Month at Unknown time  . pantoprazole (PROTONIX) 40 MG tablet Take 1  tablet (40 mg total) by mouth 2 (two) times daily. 60 tablet 0 12/31/2015 at Unknown time  . polyethylene glycol (MIRALAX / GLYCOLAX) packet Take 17 g by mouth daily.   Past Month at Unknown time  . sennosides-docusate sodium (SENOKOT-S) 8.6-50 MG tablet Take 2 tablets by mouth 2 (two) times daily.   12/31/2015 at Unknown time  . sevelamer carbonate (RENVELA) 800 MG tablet Take 2 tablets (1,600 mg total) by mouth 3 (three) times daily with meals. Takes 2 tabs with each meal and snacks as directed (Patient taking differently: Take 1,600 mg by mouth 3 (three) times daily with meals. ) 180 tablet 0 12/31/2015 at Unknown time  . simvastatin (ZOCOR) 10 MG tablet Take 1 tablet (10 mg total) by mouth at bedtime. Reported on 05/30/2015 30 tablet 0 12/31/2015 at Unknown time  . ziprasidone (GEODON) 60 MG capsule Take 1 capsule (60 mg total) by mouth 2 (two) times daily with a meal. 60 capsule 0 12/31/2015 at Unknown time    Treatment Modalities: Medication Management, Group therapy, Case management,  1 to 1 session with clinician, Psychoeducation, Recreational therapy.   Physician Treatment Plan for Primary Diagnosis: Bipolar affective disorder, current episode manic with psychotic symptoms ([place]) Long Term Goal(s): Improvement in symptoms so as ready for discharge  Short Term Goals: Ability to identify changes in lifestyle to reduce recurrence of condition will improve, Ability to demonstrate self-control will improve, Ability to identify and develop effective coping behaviors will improve, Ability to maintain clinical measurements within normal limits will improve and Compliance with  prescribed medications will improve  Medication Management: Evaluate patient's response, side effects, and tolerance of medication regimen.  Therapeutic Interventions: 1 to 1 sessions, Unit Group sessions and Medication administration.  Evaluation of Outcomes: Progressing  Physician Treatment Plan for Secondary Diagnosis: Principal Problem:   Bipolar affective disorder, current episode manic with psychotic symptoms ([place]) Active Problems:   Tobacco use disorder   CKD (chronic kidney disease) stage V requiring chronic dialysis ([facility])   Anemia of renal disease   Dyslipidemia   Asthma   Benign essential HTN   Bipolar affective disorder, manic, severe, with psychotic behavior ([place])   Long Term Goal(s): Improvement in symptoms so as ready for discharge  Short Term Goals: Ability to identify changes in lifestyle to reduce recurrence of condition will improve, Ability to demonstrate self-control will improve, Ability to identify and develop effective coping behaviors will improve, Ability to maintain clinical measurements within normal limits will improve and Compliance with prescribed medications will improve  Medication Management: Evaluate patient's response, side effects, and tolerance of medication regimen.  Therapeutic Interventions: 1 to 1 sessions, Unit Group sessions and Medication administration.  Evaluation of Outcomes: Progressing   RN Treatment Plan for Primary Diagnosis: Bipolar affective disorder, current episode manic with psychotic symptoms ([place]) Long Term Goal(s): Knowledge of disease and therapeutic regimen to maintain health will improve  Short Term Goals: Ability to remain free from injury will improve, Ability to verbalize frustration and anger appropriately will improve, Ability to demonstrate self-control, Ability to participate in decision making will improve, Ability to verbalize feelings will improve, Ability to identify and develop effective coping  behaviors will improve and Compliance with prescribed medications will improve  Medication Management: RN will administer medications as ordered by provider, will assess and evaluate patient's response and provide education to patient for prescribed medication. RN will report any adverse and/or side effects to prescribing provider.  Therapeutic Interventions: 1 on 1 counseling sessions, Psychoeducation, Medication  administration, Evaluate responses to treatment, Monitor vital signs and CBGs as ordered, Perform/monitor CIWA, COWS, AIMS and Fall Risk screenings as ordered, Perform wound care treatments as ordered.  Evaluation of Outcomes: Progressing   LCSW Treatment Plan for Primary Diagnosis: Bipolar affective disorder, current episode manic with psychotic symptoms ([place]) Long Term Goal(s): Safe transition to appropriate next level of care at discharge, Engage patient in therapeutic group addressing interpersonal concerns.  Short Term Goals: Engage patient in aftercare planning with referrals and resources, Increase social support, Increase emotional regulation, Facilitate acceptance of mental health diagnosis and concerns and Increase skills for wellness and recovery  Therapeutic Interventions: Assess for all discharge needs, 1 to 1 time with Social worker, Explore available resources and support systems, Assess for adequacy in community support network, Educate family and significant other(s) on suicide prevention, Complete Psychosocial Assessment, Interpersonal group therapy.  Evaluation of Outcomes: Progressing   Progress in Treatment: Attending groups: Yes Participating in groups: Yes Taking medication as prescribed: Yes, MD continues to assess for medication changes as needed Toleration medication: Yes, no side effects reported at this time Family/Significant other contact made: CSW, still assessing for appropriate contacts Patient understands diagnosis: Yes Discussing  patient identified problems/goals with staff: Yes Medical problems stabilized or resolved: Ongoing / Yes Denies suicidal/homicidal ideation: Yes Issues/concerns per patient self-inventory: None Other: N/A  New problem(s) identified: None identified at this time.   New Short Term/Long Term Goal(s): None identified at this time.   Discharge Plan or Barriers: Pt will discharge home and will follow up with Dr. Rosine Door for medication management and therapy   Reason for Continuation of Hospitalization: Anxiety Delusions   Depression Hallucinations Homicidal ideation [place] Medical Issues Medication stabilization    Estimated Length of Stay/Date of discharge: 1 day Attendees: Patient:Jennifer Lawson 01/12/2016 5:53 PM  Physician: Orson Slick, MD 01/12/2016 5:53 PM  Nursing: Polly Cobia, RN 01/12/2016 5:53 PM  RN Care Manager: 01/12/2016 5:53 PM  Social Worker: Dossie Arbour, [place] 01/12/2016 5:53 PM  Recreational Therapist:  01/12/2016 5:53 PM  Other:  01/12/2016 5:53 PM  Other:  01/12/2016 5:53 PM  Other: 01/12/2016 5:53 PM    Scribe for Treatment Team: August Saucer, LCSW 01/12/2016 5:53 PM

## 2016-01-12 NOTE — Progress Notes (Signed)
[facility] MD Progress Note  01/12/2016 12:55 PM Jennifer Lawson  MRN:  [id number]  Subjective:  Jennifer Lawson is a 55 year old female with a history of bipolar disorder and and and kidney disease on dialysis admitted for psychotic agitation and aggressive behavior.  Jennifer Lawson is pleasant and polite today. She denies any symptoms of depression, anxiety, or psychosis she however does get easily agitated and has been mean to staff and peers. She takes medications as prescribed as evidenced by therapeutic VPA level of 70 today. She is compliant with dialysis. She tolerates treatment well. She complains of aches and pains all over for each at home she take multiple doses of over-the-counter pain medication including BC powders. She is able to participate in groups only briefly and can be disruptive. Sleep has been poor.  Principal Problem: Bipolar affective disorder, current episode manic with psychotic symptoms ([place]) Diagnosis:   Patient Active Problem List   Diagnosis Date Noted  . Bipolar affective disorder, manic, severe, with psychotic behavior ([place]) [F31.2] 01/06/2016  . HCAP (healthcare-associated pneumonia) [J18.9] 01/01/2016  . Acute blood loss anemia [D62] 11/25/2015  . GI bleed [K92.2] 11/24/2015  . Benign essential HTN [I10] 11/24/2015  . Physical deconditioning [R53.81]   . Asthma [J45.909] 08/06/2015  . Cerebrovascular disease [I67.9] 07/23/2015  . Dyslipidemia [E78.5] 07/14/2015  . Bipolar affective disorder, current episode manic with psychotic symptoms ([place]) [F31.2] 07/13/2015  . Anemia of renal disease [D63.1] 01/26/2015  . CKD (chronic kidney disease) stage V requiring chronic dialysis ([place]) [N18.6, Z99.2] 05/13/2014  . COPD (chronic obstructive pulmonary disease) ([place]) [J44.9] 09/26/2013  . Hyperparathyroidism, primary ([place]) [E21.0] 02/20/2013  . Tobacco use disorder [F17.200] 11/13/2012   Total Time spent with patient: 20 minutes  Past Psychiatric History: Bipolar  disorder.  Past Medical History:  Past Medical History:  Diagnosis Date  . Anemia of renal disease   . Asthma   . Bipolar 1 disorder ([place])   . Chronic kidney disease    06/11/13- not on dialysis  . Complication of anesthesia    difficulty going to sleep  . COPD (chronic obstructive pulmonary disease) ([place])   . Depression   . Diabetes mellitus without complication ([place])    denies  . GERD (gastroesophageal reflux disease)   . Heart murmur   . History of blood transfusion   . Hypertension   . Mental disorder   . Overdose   . PTSD (post-traumatic stress disorder)   . Seizures ([place])    "passsed out"  . Shortness of breath    lying down flat  . Tobacco use disorder 11/13/2012    Past Surgical History:  Procedure Laterality Date  . AV FISTULA PLACEMENT Right 06/12/2013   Procedure: ARTERIOVENOUS (AV) FISTULA CREATION; RIGHT  BASILIC VEIN TRANSPOSITION with Intraoperative ultrasound;  Surgeon: Mal [name], MD;  Location: [place];  Service: Vascular;  Laterality: Right;  . COLONOSCOPY N/A 11/30/2015   Procedure: COLONOSCOPY;  Surgeon: Wilford Corner, MD;  Location: [facility] ENDOSCOPY;  Service: Endoscopy;  Laterality: N/A;  . ESOPHAGOGASTRODUODENOSCOPY Left 11/14/2012   Procedure: ESOPHAGOGASTRODUODENOSCOPY (EGD);  Surgeon: [name], MD;  Location: [facility] ENDOSCOPY;  Service: Endoscopy;  Laterality: Left;  . ESOPHAGOGASTRODUODENOSCOPY N/A 11/30/2015   Procedure: ESOPHAGOGASTRODUODENOSCOPY (EGD);  Surgeon: Wilford Corner, MD;  Location: [facility] ENDOSCOPY;  Service: Endoscopy;  Laterality: N/A;  . JOINT REPLACEMENT Right    knee  . PARATHYROIDECTOMY    . Right knee replacement     she says it was last year.   Family History:  Family History  Problem Relation Age of Onset  . Diabetes Mother   . Hyperlipidemia Mother   . Hypertension Mother   . Diabetes Father   . Hypertension Father   . Hyperlipidemia Father    Family Psychiatric  History: See H&P. Social History:  History  Alcohol Use   . Yes    Comment: "I like a cold beer every once in a while"      History  Drug Use No    Comment: pt denied any drug use    Social History   Social History  . Marital status: Divorced    Spouse name: N/A  . Number of children: N/A  . Years of education: N/A   Social History Main Topics  . Smoking status: Current Every Day Smoker    Packs/day: 2.00    Years: 40.00    Types: Cigarettes, Cigars  . Smokeless tobacco: Never Used  . Alcohol use Yes     Comment: "I like a cold beer every once in a while"   . Drug use: No     Comment: pt denied any drug use  . Sexual activity: No   Other Topics Concern  . None   Social History Narrative  . None      Sleep: Fair  Appetite:  Fair  Current Medications: Current Facility-Administered Medications  Medication Dose Route Frequency Provider Last Rate Last Dose  . acetaminophen (TYLENOL) tablet 650 mg  650 mg Oral Q6H PRN Valicia Rief B Giordana Weinheimer, MD      . albuterol (PROVENTIL HFA;VENTOLIN HFA) 108 (90 Base) MCG/ACT inhaler 2 puff  2 puff Inhalation Q6H PRN Clovis Fredrickson, MD   2 puff at 01/11/16 1702  . alum & mag hydroxide-simeth (MAALOX/MYLANTA) 200-200-20 MG/5ML suspension 30 mL  30 mL Oral PRN Imogene Gravelle B Miryah Ralls, MD      . alum & mag hydroxide-simeth (MAALOX/MYLANTA) 200-200-20 MG/5ML suspension 30 mL  30 mL Oral Q4H PRN Ana Liaw B Alesana Magistro, MD      . cinacalcet (SENSIPAR) tablet 60 mg  60 mg Oral Q supper Clovis Fredrickson, MD   60 mg at 01/11/16 1630  . divalproex (DEPAKOTE ER) 24 hr tablet 1,000 mg  1,000 mg Oral BID Clovis Fredrickson, MD   1,000 mg at 01/12/16 0948  . docusate sodium (COLACE) capsule 100 mg  100 mg Oral BID Clovis Fredrickson, MD   100 mg at 01/12/16 0949  . doxepin (SINEQUAN) capsule 50 mg  50 mg Oral QHS Clovis Fredrickson, MD   50 mg at 01/11/16 2204  . epoetin alfa (EPOGEN,PROCRIT) injection 4,000 Units  4,000 Units Intravenous Q T,Th,Sa-HD Murlean Iba, MD   4,000 Units at 01/12/16  1056  . ibuprofen (ADVIL,MOTRIN) tablet 600 mg  600 mg Oral Q8H PRN Clovis Fredrickson, MD   600 mg at 01/12/16 0606  . LORazepam (ATIVAN) tablet 2 mg  2 mg Oral Q6H PRN Gonzella Lex, MD   2 mg at 01/12/16 0523  . magnesium hydroxide (MILK OF MAGNESIA) suspension 30 mL  30 mL Oral Daily PRN Alithia Zavaleta B Mellany Dinsmore, MD      . metoprolol tartrate (LOPRESSOR) tablet 12.5 mg  12.5 mg Oral BID Katera Rybka B Virgal Warmuth, MD   12.5 mg at 01/11/16 1631  . mometasone-formoterol (DULERA) 100-5 MCG/ACT inhaler 2 puff  2 puff Inhalation BID Clovis Fredrickson, MD   2 puff at 01/12/16 0949  . multivitamin (RENA-VIT) tablet 1 tablet  1 tablet Oral QHS Yamen Castrogiovanni  Vevelyn Francois, MD   1 tablet at 01/11/16 2204  . MUSCLE RUB CREA   Topical BID PRN Murlean Iba, MD      . nicotine (NICODERM CQ - dosed in mg/24 hours) patch 21 mg  21 mg Transdermal Daily Brynnley Dayrit B Chaniqua Brisby, MD   21 mg at 01/12/16 0949  . ondansetron (ZOFRAN) tablet 4 mg  4 mg Oral Q8H PRN Brenley Priore B Duaa Stelzner, MD      . pantoprazole (PROTONIX) EC tablet 40 mg  40 mg Oral BID Clovis Fredrickson, MD   40 mg at 01/12/16 0948  . polyethylene glycol (MIRALAX / GLYCOLAX) packet 17 g  17 g Oral Daily Latroy Gaymon B Minas Bonser, MD   17 g at 01/12/16 0948  . senna-docusate (Senokot-S) tablet 2 tablet  2 tablet Oral BID Clovis Fredrickson, MD   2 tablet at 01/12/16 0948  . sevelamer carbonate (RENVELA) tablet 1,600 mg  1,600 mg Oral TID WC Dehlia Kilner B Yolando Gillum, MD   1,600 mg at 01/12/16 0949  . simvastatin (ZOCOR) tablet 10 mg  10 mg Oral QHS Clovis Fredrickson, MD   10 mg at 01/11/16 2204  . ziprasidone (GEODON) capsule 40 mg  40 mg Oral BID WC Clovis Fredrickson, MD   40 mg at 01/12/16 0949    Lab Results:  Results for orders placed or performed during the hospital encounter of 01/06/16 (from the past 48 hour(s))  Valproic acid level     Status: None   Collection Time: 01/12/16  7:52 AM  Result Value Ref Range   Valproic Acid Lvl 70 50.0 - 100.0 ug/mL   Renal function panel     Status: Abnormal   Collection Time: 01/12/16 10:25 AM  Result Value Ref Range   Sodium 136 135 - 145 mmol/L   Potassium 5.6 (H) 3.5 - 5.1 mmol/L   Chloride 104 101 - 111 mmol/L   CO2 26 22 - 32 mmol/L   Glucose, Bld 93 65 - 99 mg/dL   BUN 59 (H) 6 - 20 mg/dL   Creatinine, Ser 5.10 (H) 0.44 - 1.00 mg/dL   Calcium 8.6 (L) 8.9 - 10.3 mg/dL   Phosphorus 5.5 (H) 2.5 - 4.6 mg/dL   Albumin 2.9 (L) 3.5 - 5.0 g/dL   GFR calc non Af Amer 9 (L) >60 mL/min   GFR calc Af Amer 10 (L) >60 mL/min    Comment: (NOTE) The eGFR has been calculated using the CKD EPI equation. This calculation has not been validated in all clinical situations. eGFR's persistently <60 mL/min signify possible Chronic Kidney Disease.    Anion gap 6 5 - 15  CBC     Status: Abnormal   Collection Time: 01/12/16 10:25 AM  Result Value Ref Range   WBC 5.3 3.6 - 11.0 K/uL   RBC 2.71 (L) 3.80 - 5.20 MIL/uL   Hemoglobin 8.4 (L) 12.0 - 16.0 g/dL   HCT 26.4 (L) 35.0 - 47.0 %   MCV 97.6 80.0 - 100.0 fL   MCH 31.2 26.0 - 34.0 pg   MCHC 31.9 (L) 32.0 - 36.0 g/dL   RDW 19.7 (H) 11.5 - 14.5 %   Platelets 177 150 - 440 K/uL    Blood Alcohol level:  Lab Results  Component Value Date   [facility] <5 01/04/2016   ETH <5 58/01/9832    Metabolic Disorder Labs: Lab Results  Component Value Date   HGBA1C 4.4 (L) 01/02/2016   MPG 80 01/02/2016   MPG 97  05/14/2014   Lab Results  Component Value Date   PROLACTIN 126.4 (H) 06/03/2015   Lab Results  Component Value Date   CHOL 184 06/03/2015   TRIG 102 06/03/2015   HDL 65 06/03/2015   CHOLHDL 2.8 06/03/2015   VLDL 20 06/03/2015   LDLCALC 99 06/03/2015   LDLCALC 99 01/09/2015    Physical Findings: AIMS: Facial and Oral Movements Muscles of Facial Expression: None, normal Lips and Perioral Area: None, normal Jaw: None, normal Tongue: None, normal,Extremity Movements Upper (arms, wrists, hands, fingers): None, normal Lower (legs, knees, ankles,  toes): None, normal, Trunk Movements Neck, shoulders, hips: None, normal, Overall Severity Severity of abnormal movements (highest score from questions above): None, normal Incapacitation due to abnormal movements: None, normal Patient's awareness of abnormal movements (rate only patient's report): No Awareness, Dental Status Current problems with teeth and/or dentures?: No Does patient usually wear dentures?: No  CIWA:    COWS:     Musculoskeletal: Strength & Muscle Tone: within normal limits Gait & Station: unsteady Patient leans: N/A  Psychiatric Specialty Exam: Physical Exam  Nursing note and vitals reviewed. Musculoskeletal: She exhibits edema.  Neurological: She exhibits abnormal muscle tone. Coordination abnormal.  Psychiatric: Her speech is normal. Her mood appears anxious. Her affect is labile. She is agitated. Thought content is paranoid. She expresses impulsivity. She expresses no suicidal ideation. She exhibits abnormal recent memory.    Review of Systems  Constitutional: Negative for chills, fever, malaise/fatigue and weight loss.  HENT: Negative for congestion, ear discharge, ear pain, hearing loss, nosebleeds, sore throat and tinnitus.   Eyes: Negative for blurred vision, double vision and photophobia.  Respiratory: Negative for cough, sputum production, shortness of breath, wheezing and stridor.   Cardiovascular: Positive for leg swelling. Negative for chest pain and palpitations.  Gastrointestinal: Negative for abdominal pain, constipation, diarrhea, heartburn, nausea and vomiting.  Genitourinary: Negative for dysuria, frequency and urgency.  Musculoskeletal:       The patient complaints of muscle aches and pains "everywhere".  Skin: Negative for itching and rash.  Neurological: Negative.  Negative for dizziness, tingling, tremors, sensory change, focal weakness, seizures, loss of consciousness and headaches.       The patient does have difficulty with ambulation  and uses a walker.  Endo/Heme/Allergies: Does not bruise/bleed easily.  Psychiatric/Behavioral: Negative for depression, hallucinations, memory loss, substance abuse and suicidal ideas. The patient is nervous/anxious. The patient does not have insomnia.     Blood pressure 111/72, pulse 90, temperature 98.7 F (37.1 C), temperature source Oral, resp. rate (!) 21, weight 89 kg (196 lb 3.4 oz), last menstrual period 05/09/2008, SpO2 100 %.Body mass index is 35.89 kg/m.  General Appearance: Casual  Eye Contact:  Good  Speech:  Clear and Coherent  Volume:  Normal  Mood:  Depressed  Affect:  Irritable (baseline)  Thought Process:  Goal Directed  Orientation:  Full (Time, Place, and Person)  Thought Content:  WDL  Suicidal Thoughts:  No  Homicidal Thoughts:  No  Memory:  Immediate;   Fair Recent;   Fair Remote;   Fair  Judgement:  Poor  Insight:  Shallow  Psychomotor Activity:  Decreased  Concentration:  Concentration: Fair and Attention Span: Fair  Recall:  [employer] of Knowledge:  Fair  Language:  Fair  Akathisia:  No  Handed:  Right  AIMS (if indicated):     Assets:  Communication Skills Desire for Improvement Financial Resources/Insurance Housing Resilience Social Support  ADL's:  Intact  Cognition:  WNL  Sleep:  Number of Hours: 4.25     Treatment Plan Summary: Daily contact with patient to assess and evaluate symptoms and progress in treatment and Medication management   Jennifer Lawson is a 55 year old female with history of bipolar disorder and end-stage kidney disease on dialysis admitted for a mixed episode,  aggressive behavior  and poor treatment compliance.   1. Bipolar mania. We continue Depakote and Geodon for psychosis and mood stabilization. VPA 70.   2. Kidney disease. Nephrology consult is greatly appreciated. The patient will continue dialysis.  3. COPD. We continued inhalers, Albuterol and Dulera. No respiratory distress  4. Hypertension. She is  on metoprolol.   5. GERD. She is on Protonix.  6. Smoking. Nicotine patch is available.  7. Insomnia. We continue Doxepin but she slept 4 hours only.    8. Metabolic syndrome. Lipid panel, HgbA1C, TSH and PRL were done during prevoius admission.  9. Dyslipidemia. She is on Zocor.   10. Constipation. She is on Colace.   11. Chronic pain. The patient abuses all types of over-the-counter pain medication.  12. EKG. Normal sinus rhythm. QT 397.  13. Disposition. She will be discharged to home with her daughter. She will follow up with Dr. Rosine Door for medication management and her nephrologist for dialysis.    Orson Slick, MD 01/12/2016, 12:55 PM

## 2016-01-12 NOTE — BHH Group Notes (Signed)
Goals Group  Date/Time: 9:00AM Type of Therapy and Topic: Group Therapy: Goals Group: SMART Goals  ?  Participation Level: Moderate  ?  Description of Group:  ?  The purpose of a daily goals group is to assist and guide patients in setting recovery/wellness-related goals. The objective is to set goals as they relate to the crisis in which they were admitted. Patients will be using SMART goal modalities to set measurable goals. Characteristics of realistic goals will be discussed and patients will be assisted in setting and processing how one will reach their goal. Facilitator will also assist patients in applying interventions and coping skills learned in psycho-education groups to the SMART goal and process how one will achieve defined goal.  ?  Therapeutic Goals:  ?  -Patients will develop and document one goal related to or their crisis in which brought them into treatment.  -Patients will be guided by LCSW using SMART goal setting modality in how to set a measurable, attainable, realistic and time sensitive goal.  -Patients will process barriers in reaching goal.  -Patients will process interventions in how to overcome and successful in reaching goal.  ?  Patient's Goal: Patient identified her goal today as being compliant with medications and nurses orders in order to be discharged. She stated that complying with medications and staff will allow her to return home with family. ?  Therapeutic Modalities:  Motivational Interviewing  Cognitive Behavioral Therapy  Crisis Intervention Model  SMART goals setting  Emilie Rutter, MSW, LCSW-A 01/12/2016, 11:04AM

## 2016-01-12 NOTE — Progress Notes (Signed)
Subjective:  Patient seen and evaluated during hemodialysis. Has considerable lower extremity edema. Ultrafiltration target 3.5 kg today.    Objective:  Vital signs in last 24 hours:  Temp:  [98.7 F (37.1 C)] 98.7 F (37.1 C) (09/05 1021) Pulse Rate:  [88-89] 89 (09/05 1030) Resp:  [16-24] 24 (09/05 1030) BP: (128-140)/(80-90) 128/88 (09/05 1030) SpO2:  [94 %-100 %] 100 % (09/05 1030) Weight:  [89 kg (196 lb 3.4 oz)] 89 kg (196 lb 3.4 oz) (09/05 1021)  Weight change:  Filed Weights   01/07/16 1312 01/07/16 1648 01/12/16 1021  Weight: 78.6 kg (173 lb 3.2 oz) 77.1 kg (169 lb 15.6 oz) 89 kg (196 lb 3.4 oz)    Intake/Output:    Intake/Output Summary (Last 24 hours) at 01/12/16 1042 Last data filed at 01/12/16 0911  Gross per 24 hour  Intake             1320 ml  Output                0 ml  Net             1320 ml     Physical Exam: General: NAD sitting in dialysis chair  HEENT Moist oral mucus membranes  Neck supple  Pulm/lungs Normal breathing effort, CTAB   CVS/Heart S1S2 no rubs  Abdomen:  Soft, NTND, BS present  Extremities: 3+ b/l LE edema  Neurologic: Resting quietly, follows commands  Skin: Normal turgor, no rashes  Access: AVG       Basic Metabolic Panel:   Recent Labs Lab 01/06/16 1042 01/07/16 0726  NA 133*  --   K 5.4*  --   CL 97*  --   CO2 24  --   GLUCOSE 114*  --   BUN 72*  --   CREATININE 6.09*  --   CALCIUM 8.7*  --   PHOS  --  6.1*     CBC:  Recent Labs Lab 01/06/16 1042  WBC 5.7  HGB 9.0*  HCT 28.9*  MCV 99.0  PLT PLATELET CLUMPS NOTED ON SMEAR, COUNT APPEARS ADEQUATE      Microbiology:  Recent Results (from the past 720 hour(s))  Culture, blood (routine x 2) Call MD if unable to obtain prior to antibiotics being given     Status: None   Collection Time: 01/01/16  6:39 PM  Result Value Ref Range Status   Specimen Description BLOOD BLOOD LEFT FOREARM  Final   Special Requests IN PEDIATRIC BOTTLE 4CC  Final   Culture NO GROWTH 5 DAYS  Final   Report Status 01/06/2016 FINAL  Final  Culture, blood (routine x 2)     Status: None   Collection Time: 01/01/16  6:49 PM  Result Value Ref Range Status   Specimen Description BLOOD BLOOD LEFT HAND  Final   Special Requests IN PEDIATRIC BOTTLE 3CC  Final   Culture NO GROWTH 5 DAYS  Final   Report Status 01/06/2016 FINAL  Final    Coagulation Studies: No results for input(s): LABPROT, INR in the last 72 hours.  Urinalysis: No results for input(s): COLORURINE, LABSPEC, PHURINE, GLUCOSEU, HGBUR, BILIRUBINUR, KETONESUR, PROTEINUR, UROBILINOGEN, NITRITE, LEUKOCYTESUR in the last 72 hours.  Invalid input(s): APPERANCEUR    Imaging: No results found.   Medications:     . cinacalcet  60 mg Oral Q supper  . divalproex  1,000 mg Oral BID  . docusate sodium  100 mg Oral BID  . doxepin  50 mg  Oral QHS  . epoetin (EPOGEN/PROCRIT) injection  4,000 Units Intravenous Q T,Th,Sa-HD  . metoprolol tartrate  12.5 mg Oral BID  . mometasone-formoterol  2 puff Inhalation BID  . multivitamin  1 tablet Oral QHS  . nicotine  21 mg Transdermal Daily  . pantoprazole  40 mg Oral BID  . polyethylene glycol  17 g Oral Daily  . senna-docusate  2 tablet Oral BID  . sevelamer carbonate  1,600 mg Oral TID WC  . simvastatin  10 mg Oral QHS  . ziprasidone  40 mg Oral BID WC   acetaminophen, albuterol, alum & mag hydroxide-simeth, alum & mag hydroxide-simeth, ibuprofen, LORazepam, magnesium hydroxide, MUSCLE RUB, ondansetron  Assessment/ Plan:  55 y.o.AA  female with ESRD on HD since 1/16 followed by [facility], COPD, secondary hyperparathyroidism, anemia of CKD, bipolar affective disorder, h./o gastric ulcers caused by NSAIDs  1. ESRD on HD TTS:  Patient seen and evaluated during hemodialysis. Ultrafiltration target today will be 3.5 kg as tolerated.  2. Anemia of CKD: Most recent hemoglobin was 9.0. Continue Epogen 4000 units IV with dialysis.   3.  Secondary hyperparathyroidism of renal origin:  Phosphorus was slightly high at last check at 6.1. Continue Renvela 1600 mg by mouth 3 times a day as well as Sensipar.  4. Hypertension: Blood pressure currently 128/88. Continue metoprolol 12.5 mg by mouth twice a day.   5. Patient previously requesting Pain meds - already getting ibuprofen - careful administration with food as patient has h/o NSAID induced ulcer EGD 11/30/15 - consider topical creams such as Bengay to minimize use of oral NSAIDs   LOS: 6 Hady Niemczyk 9/5/201710:42 AM

## 2016-01-13 NOTE — BHH Suicide Risk Assessment (Signed)
[facility] INPATIENT:  Family/Significant Other Suicide Prevention Education  Suicide Prevention Education:  Education Completed; Jennifer Lawson (daughter) [phone], has been identified by the patient as the family member/significant other with whom the patient will be residing, and identified as the person(s) who will aid the patient in the event of a mental health crisis (suicidal ideations/suicide attempt).  With written consent from the patient, the family member/significant other has been provided the following suicide prevention education, prior to the and/or following the discharge of the patient.  The suicide prevention education provided includes the following:  Suicide risk factors  Suicide prevention and interventions  National Suicide Hotline telephone number  [facility] assessment telephone number  [facility] Emergency Assistance [place] and/or Residential Mobile Crisis Unit telephone number  Request made of family/significant other to:  Remove weapons (e.g., guns, rifles, knives), all items previously/currently identified as safety concern.    Remove drugs/medications (over-the-counter, prescriptions, illicit drugs), all items previously/currently identified as a safety concern.  The family member/significant other verbalizes understanding of the suicide prevention education information provided.  The family member/significant other agrees to remove the items of safety concern listed above.  Jennifer Lawson G. [place], [place] 01/13/2016, 10:16 AM

## 2016-01-13 NOTE — Progress Notes (Signed)
  [facility] Adult Case Management Discharge Plan :  Will you be returning to the same living situation after discharge:  Yes,  patient will return to living with daughter.  At discharge, do you have transportation home?: Yes,  daughter Do you have the ability to pay for your medications: Yes,  Patient has insurance.   Release of information consent forms completed and in the chart;  Patient's signature needed at discharge.  Patient to Follow up at: Follow-up Information    [facility]. Go on 02/12/2016.   Why:  Please go to see Dr. Rosine Door on 02/12/2016 at 4pm for medication management and outpatient therapy follow-up.  Contact information: [address], [place] [address] [phone]          Next level of care provider has access to [place] and Suicide Prevention discussed: Yes,  with daughter and patient.   Have you used any form of tobacco in the last 30 days? (Cigarettes, Smokeless Tobacco, Cigars, and/or Pipes): Patient Refused Screening  Has patient been referred to the Quitline?: Patient refused referral  Patient has been referred for addiction treatment: N/A  Jannie Doyle G. [place], [place] 01/13/2016, 10:17 AM

## 2016-01-13 NOTE — [facility] Suicide Risk Assessment (Signed)
[facility] Discharge Suicide Risk Assessment   Principal Problem: Bipolar affective disorder, current episode manic with psychotic symptoms [facility]) Discharge Diagnoses:  Patient Active Problem List   Diagnosis Date Noted  . HCAP (healthcare-associated pneumonia) [J18.9] 01/01/2016  . Acute blood loss anemia [D62] 11/25/2015  . GI bleed [K92.2] 11/24/2015  . Benign essential HTN [I10] 11/24/2015  . Physical deconditioning [R53.81]   . Asthma [J45.909] 08/06/2015  . Cerebrovascular disease [I67.9] 07/23/2015  . Dyslipidemia [E78.5] 07/14/2015  . Bipolar affective disorder, current episode manic with psychotic symptoms ([place]) [F31.2] 07/13/2015  . Anemia of renal disease [D63.1] 01/26/2015  . CKD (chronic kidney disease) stage V requiring chronic dialysis ([place]) [N18.6, Z99.2] 05/13/2014  . COPD (chronic obstructive pulmonary disease) ([place]) [J44.9] 09/26/2013  . Hyperparathyroidism, primary ([place]) [E21.0] 02/20/2013  . Tobacco use disorder [F17.200] 11/13/2012    Total Time spent with patient: 30 minutes  Musculoskeletal: Strength & Muscle Tone: within normal limits Gait & Station: normal Patient leans: N/A  Psychiatric Specialty Exam: Review of Systems  Musculoskeletal: Positive for joint pain and myalgias.  All other systems reviewed and are negative.   Blood pressure (!) 160/90, pulse 89, temperature 98.7 F (37.1 C), resp. rate 18, weight 89 kg (196 lb 3.4 oz), last menstrual period 05/09/2008, SpO2 97 %.Body mass index is 35.89 kg/m.  General Appearance: Casual  Eye Contact::  Good  Speech:  Clear and [id number]  Volume:  Normal  Mood:  Euthymic  Affect:  Appropriate  Thought Process:  Goal Directed  Orientation:  Full (Time, Place, and Person)  Thought Content:  WDL  Suicidal Thoughts:  No  Homicidal Thoughts:  No  Memory:  Immediate;   Fair Recent;   Fair Remote;   Fair  Judgement:  Impaired  Insight:  Shallow  Psychomotor Activity:  Normal  Concentration:  Fair   Recall:  [place]  Language: Fair  Akathisia:  No  Handed:  Right  AIMS (if indicated):     Assets:  Communication Skills Desire for Improvement Financial Resources/Insurance Housing Resilience Social Support  Sleep:  Number of Hours: 7.15  Cognition: WNL  ADL's:  Intact   Mental Status Per Nursing Assessment::   On Admission:     Demographic Factors:  Divorced or widowed  Loss Factors: Decline in physical health and NA  Historical Factors: Impulsivity  Risk Reduction Factors:   Responsible for children under 56 years of age, Sense of responsibility to family, Living with another person, especially a relative, Positive social support and Positive therapeutic relationship  Continued Clinical Symptoms:  Bipolar Disorder:   Mixed State  Cognitive Features That Contribute To Risk:  None    Suicide Risk:  Minimal: No identifiable suicidal ideation.  Patients presenting with no risk factors but with morbid ruminations; may be classified as minimal risk based on the severity of the depressive symptoms    Plan Of Care/Follow-up recommendations:  Activity:  as tolerated. Diet:  heart healthy. Other:  keep follow up appointments.  Orson Slick, MD 01/13/2016, 9:04 AM

## 2016-01-13 NOTE — B[facility] Group Notes (Signed)
[place] LCSW Group Therapy   01/13/2016  9:30 am   Type of Therapy: Group Therapy   Participation Level: Invited but did not attend.   Glorious Peach, MSW, Latanya Presser

## 2016-01-13 NOTE — Tx Team (Signed)
Interdisciplinary Treatment and Diagnostic Plan Update  01/13/2016 Time of Session: 10:30am Jennifer Lawson MRN: [id number]  Principal Diagnosis: Bipolar affective disorder, current episode manic with psychotic symptoms ([place])  Secondary Diagnoses: Principal Problem:   Bipolar affective disorder, current episode manic with psychotic symptoms ([place]) Active Problems:   Tobacco use disorder   Hyperparathyroidism, primary ([place])   CKD (chronic kidney disease) stage V requiring chronic dialysis ([place])   Anemia of renal disease   Dyslipidemia   Asthma   Benign essential HTN   Current Medications:  Current Facility-Administered Medications  Medication Dose Route Frequency Provider Last Rate Last Dose  . acetaminophen (TYLENOL) tablet 650 mg  650 mg Oral Q6H PRN Jolanta B Pucilowska, MD      . albuterol (PROVENTIL HFA;VENTOLIN HFA) 108 (90 Base) MCG/ACT inhaler 2 puff  2 puff Inhalation Q6H PRN Clovis Fredrickson, MD   2 puff at 01/11/16 1702  . alum & mag hydroxide-simeth (MAALOX/MYLANTA) 200-200-20 MG/5ML suspension 30 mL  30 mL Oral PRN Jolanta B Pucilowska, MD      . alum & mag hydroxide-simeth (MAALOX/MYLANTA) 200-200-20 MG/5ML suspension 30 mL  30 mL Oral Q4H PRN Jolanta B Pucilowska, MD      . cinacalcet (SENSIPAR) tablet 60 mg  60 mg Oral Q supper Clovis Fredrickson, MD   60 mg at 01/12/16 1702  . divalproex (DEPAKOTE ER) 24 hr tablet 1,000 mg  1,000 mg Oral BID Clovis Fredrickson, MD   1,000 mg at 01/13/16 0907  . docusate sodium (COLACE) capsule 100 mg  100 mg Oral BID Clovis Fredrickson, MD   100 mg at 01/13/16 0907  . doxepin (SINEQUAN) capsule 50 mg  50 mg Oral QHS Jolanta B Pucilowska, MD   50 mg at 01/12/16 2200  . epoetin alfa (EPOGEN,PROCRIT) injection 4,000 Units  4,000 Units Intravenous Q T,Th,Sa-HD Murlean Iba, MD   4,000 Units at 01/12/16 1056  . ibuprofen (ADVIL,MOTRIN) tablet 600 mg  600 mg Oral Q8H PRN Jolanta B Pucilowska, MD   600 mg at 01/12/16 1554  .  LORazepam (ATIVAN) tablet 2 mg  2 mg Oral Q6H PRN Gonzella Lex, MD   2 mg at 01/13/16 0908  . magnesium hydroxide (MILK OF MAGNESIA) suspension 30 mL  30 mL Oral Daily PRN Jolanta B Pucilowska, MD      . metoprolol tartrate (LOPRESSOR) tablet 12.5 mg  12.5 mg Oral BID Clovis Fredrickson, MD   12.5 mg at 01/13/16 0907  . mometasone-formoterol (DULERA) 100-5 MCG/ACT inhaler 2 puff  2 puff Inhalation BID Clovis Fredrickson, MD   2 puff at 01/13/16 0906  . multivitamin (RENA-VIT) tablet 1 tablet  1 tablet Oral QHS Clovis Fredrickson, MD   1 tablet at 01/12/16 2157  . MUSCLE RUB CREA   Topical BID PRN Murlean Iba, MD      . nicotine (NICODERM CQ - dosed in mg/24 hours) patch 21 mg  21 mg Transdermal Daily Jolanta B Pucilowska, MD   21 mg at 01/13/16 0911  . ondansetron (ZOFRAN) tablet 4 mg  4 mg Oral Q8H PRN Jolanta B Pucilowska, MD      . pantoprazole (PROTONIX) EC tablet 40 mg  40 mg Oral BID Clovis Fredrickson, MD   40 mg at 01/13/16 0907  . polyethylene glycol (MIRALAX / GLYCOLAX) packet 17 g  17 g Oral Daily Clovis Fredrickson, MD   17 g at 01/13/16 0907  . senna-docusate (Senokot-S) tablet 2 tablet  2  tablet Oral BID Clovis Fredrickson, MD   2 tablet at 01/13/16 0908  . sevelamer carbonate (RENVELA) tablet 1,600 mg  1,600 mg Oral TID WC Jolanta B Pucilowska, MD   1,600 mg at 01/13/16 0908  . simvastatin (ZOCOR) tablet 10 mg  10 mg Oral QHS Clovis Fredrickson, MD   10 mg at 01/12/16 2100  . ziprasidone (GEODON) capsule 40 mg  40 mg Oral BID WC Jolanta B Pucilowska, MD   40 mg at 01/13/16 0908   PTA Medications: Prescriptions Prior to Admission  Medication Sig Dispense Refill Last Dose  . albuterol (PROVENTIL HFA;VENTOLIN HFA) 108 (90 Base) MCG/ACT inhaler Inhale 2 puffs into the lungs every 6 (six) hours as needed for wheezing or shortness of breath. 1 Inhaler 0 Past Week at Unknown time  . albuterol (PROVENTIL) (2.5 MG/3ML) 0.083% nebulizer solution Take 2.5 mg by nebulization  every 6 (six) hours as needed for wheezing.   12/31/2015 at Unknown time  . B Complex-C (B-COMPLEX WITH VITAMIN C) tablet Take 1 tablet by mouth daily.   12/31/2015 at Unknown time  . b complex-vitamin c-folic acid (NEPHRO-VITE) 0.8 MG TABS tablet Take 1 tablet by mouth at bedtime. 30 tablet 0 01/01/2016 at Unknown time  . cinacalcet (SENSIPAR) 60 MG tablet Take 1 tablet (60 mg total) by mouth daily. Takes with largest meal once daily (Patient taking differently: Take 60 mg by mouth daily. ) 30 tablet 0 unknown at unknown  . Darbepoetin Alfa (ARANESP) 40 MCG/0.4ML SOSY injection Inject 0.4 mLs (40 mcg total) into the vein every Saturday with hemodialysis. 8.4 mL  Past Week at Unknown time  . Docusate Sodium (DSS) 100 MG CAPS Take 100 mg by mouth 2 (two) times daily. 60 each 0 Past Week at Unknown time  . doxepin (SINEQUAN) 50 MG capsule Take 1 capsule (50 mg total) by mouth at bedtime. (Patient taking differently: Take 50 mg by mouth at bedtime as needed (sleep). ) 30 capsule 0 Past Week at Unknown time  . furosemide (LASIX) 40 MG tablet Take 60 mg by mouth 2 (two) times daily.  0   . levofloxacin (LEVAQUIN) 250 MG tablet Take 1 tablet (250 mg total) by mouth every other day. 5 tablet 0   . LORazepam (ATIVAN) 0.5 MG tablet Take 1 tablet (0.5 mg total) by mouth 3 (three) times daily. 90 tablet 0 Past Week at Unknown time  . Melatonin 5 MG TABS Take 5 mg by mouth at bedtime.     . metoprolol tartrate (LOPRESSOR) 25 MG tablet Take 0.5 tablets (12.5 mg total) by mouth 2 (two) times daily.   01/01/2016 at [place]  . mometasone-formoterol (DULERA) 100-5 MCG/ACT AERO Inhale 2 puffs into the lungs 2 (two) times daily. 1 Inhaler 0 12/31/2015 at Unknown time  . nicotine (NICODERM CQ - DOSED IN MG/24 HOURS) 21 mg/24hr patch Place 1 patch (21 mg total) onto the skin daily. 28 patch 0   . ondansetron (ZOFRAN-ODT) 4 MG disintegrating tablet Take 4 mg by mouth every 6 (six) hours as needed for nausea or vomiting.   Past Month  at Unknown time  . pantoprazole (PROTONIX) 40 MG tablet Take 1 tablet (40 mg total) by mouth 2 (two) times daily. 60 tablet 0 12/31/2015 at Unknown time  . polyethylene glycol (MIRALAX / GLYCOLAX) packet Take 17 g by mouth daily.   Past Month at Unknown time  . sennosides-docusate sodium (SENOKOT-S) 8.6-50 MG tablet Take 2 tablets by mouth 2 (two) times daily.  12/31/2015 at Unknown time  . sevelamer carbonate (RENVELA) 800 MG tablet Take 2 tablets (1,600 mg total) by mouth 3 (three) times daily with meals. Takes 2 tabs with each meal and snacks as directed (Patient taking differently: Take 1,600 mg by mouth 3 (three) times daily with meals. ) 180 tablet 0 12/31/2015 at Unknown time  . simvastatin (ZOCOR) 10 MG tablet Take 1 tablet (10 mg total) by mouth at bedtime. Reported on 05/30/2015 30 tablet 0 12/31/2015 at Unknown time  . [DISCONTINUED] divalproex (DEPAKOTE ER) 500 MG 24 hr tablet Take 2 tablets (1,000 mg total) by mouth 2 (two) times daily. 120 tablet 0   . [DISCONTINUED] hydrOXYzine (ATARAX/VISTARIL) 25 MG tablet Take 25 mg by mouth 2 (two) times daily as needed for anxiety.     . [DISCONTINUED] ziprasidone (GEODON) 60 MG capsule Take 1 capsule (60 mg total) by mouth 2 (two) times daily with a meal. 60 capsule 0 12/31/2015 at Unknown time    Treatment Modalities: Medication Management, Group therapy, Case management,  1 to 1 session with clinician, Psychoeducation, Recreational therapy.   Physician Treatment Plan for Primary Diagnosis: Bipolar affective disorder, current episode manic with psychotic symptoms ([place]) Long Term Goal(s): Improvement in symptoms so as ready for discharge   Short Term Goals: Ability to identify changes in lifestyle to reduce recurrence of condition will improve, Ability to demonstrate self-control will improve, Ability to identify and develop effective coping behaviors will improve, Ability to maintain clinical measurements within normal limits will improve and  Compliance with prescribed medications will improve  Medication Management: Evaluate patient's response, side effects, and tolerance of medication regimen.  Therapeutic Interventions: 1 to 1 sessions, Unit Group sessions and Medication administration.  Evaluation of Outcomes: Adequate for Discharge  Physician Treatment Plan for Secondary Diagnosis: Principal Problem:   Bipolar affective disorder, current episode manic with psychotic symptoms ([place]) Active Problems:   Tobacco use disorder   Hyperparathyroidism, primary ([place])   CKD (chronic kidney disease) stage V requiring chronic dialysis ([place])   Anemia of renal disease   Dyslipidemia   Asthma   Benign essential HTN  Long Term Goal(s): Improvement in symptoms so as ready for discharge  Short Term Goals: Ability to identify changes in lifestyle to reduce recurrence of condition will improve, Ability to demonstrate self-control will improve, Ability to identify and develop effective coping behaviors will improve, Ability to maintain clinical measurements within normal limits will improve and Compliance with prescribed medications will improve  Medication Management: Evaluate patient's response, side effects, and tolerance of medication regimen.  Therapeutic Interventions: 1 to 1 sessions, Unit Group sessions and Medication administration.  Evaluation of Outcomes: Adequate for Discharge   RN Treatment Plan for Primary Diagnosis: Bipolar affective disorder, current episode manic with psychotic symptoms ([place]) Long Term Goal(s): Knowledge of disease and therapeutic regimen to maintain health will improve  Short Term Goals: Ability to remain free from injury will improve, Ability to verbalize frustration and anger appropriately will improve, Ability to demonstrate self-control, Ability to participate in decision making will improve, Ability to verbalize feelings will improve, Ability to identify and develop effective coping behaviors will  improve and Compliance with prescribed medications will improve  Medication Management: RN will administer medications as ordered by provider, will assess and evaluate patient's response and provide education to patient for prescribed medication. RN will report any adverse and/or side effects to prescribing provider.  Therapeutic Interventions: 1 on 1 counseling sessions, Psychoeducation, Medication administration, Evaluate responses to treatment, Monitor vital  signs and CBGs as ordered, Perform/monitor CIWA, COWS, AIMS and Fall Risk screenings as ordered, Perform wound care treatments as ordered.  Evaluation of Outcomes: Adequate for Discharge  LCSW Treatment Plan for Primary Diagnosis: Bipolar affective disorder, current episode manic with psychotic symptoms ([place]) Long Term Goal(s): Safe transition to appropriate next level of care at discharge, Engage patient in therapeutic group addressing interpersonal concerns.  Short Term Goals: Engage patient in aftercare planning with referrals and resources, Increase social support, Increase emotional regulation, Facilitate acceptance of mental health diagnosis and concerns and Increase skills for wellness and recovery  Therapeutic Interventions: Assess for all discharge needs, 1 to 1 time with Social worker, Explore available resources and support systems, Assess for adequacy in community support network, Educate family and significant other(s) on suicide prevention, Complete Psychosocial Assessment, Interpersonal group therapy.  Evaluation of Outcomes: Adequate for Discharge   Progress in Treatment: Attending groups: Yes. Participating in groups: Yes. Taking medication as prescribed: Yes. Toleration medication: Yes. Family/Significant other contact made: Yes, individual(s) contacted:  daughter Patient understands diagnosis: Yes. Discussing patient identified problems/goals with staff: Yes. Medical problems stabilized or resolved: Yes. Denies  suicidal/homicidal ideation: Yes. Issues/concerns per patient self-inventory: No. Other: n/a  New problem(s) identified: None identified at this time.  New Short Term/Long Term Goal(s): None identified at this time.   Discharge Plan or Barriers: Patient will discharging back home with her daughter and has a scheduled follow-up appointment with her psychiatrist Dr. Rosine Door on 02/12/2016 at [place].   Reason for Continuation of Hospitalization: Discharge 01/13/2016  Estimated Length of Stay: Discharge 01/13/2016  Attendees: Patient: Jennifer Lawson 01/13/2016 10:33 AM  Physician: Dr. Bary Leriche, MD 01/13/2016 10:33 AM  Nursing: Carolynn Sayers, RN 01/13/2016 10:33 AM  RN Care Manager: 01/13/2016 10:33 AM  Social Worker: Lear Ng. Claybon Jabs MSW, [place] 01/13/2016 10:33 AM  Recreational Therapist: Leonette Monarch, LRT/CTRS 01/13/2016 10:33 AM  Other:  01/13/2016 10:33 AM  Other:  01/13/2016 10:33 AM  Other: 01/13/2016 10:33 AM    Scribe for Treatment Team: Lear Ng. [place], [facility] 01/13/2016 10:33 AM

## 2016-01-13 NOTE — Progress Notes (Signed)
Subjective:  Patient completed hemodialysis yesterday. States she's feeling well today.    Objective:  Vital signs in last 24 hours:  Temp:  [98 F (36.7 C)-99.4 F (37.4 C)] 98.7 F (37.1 C) (09/06 0700) Pulse Rate:  [86-93] 89 (09/06 0700) Resp:  [18-23] 18 (09/06 0700) BP: (110-160)/(72-97) 160/90 (09/06 0700) SpO2:  [96 %-100 %] 97 % (09/05 1825)  Weight change:  Filed Weights   01/07/16 1312 01/07/16 1648 01/12/16 1021  Weight: 78.6 kg (173 lb 3.2 oz) 77.1 kg (169 lb 15.6 oz) 89 kg (196 lb 3.4 oz)    Intake/Output:    Intake/Output Summary (Last 24 hours) at 01/13/16 1130 Last data filed at 01/12/16 1812  Gross per 24 hour  Intake              360 ml  Output             3500 ml  Net            -3140 ml     Physical Exam: General: NAD sitting Up in chair   HEENT Moist oral mucus membranes  Neck supple  Pulm/lungs Normal breathing effort, CTAB   CVS/Heart S1S2 no rubs  Abdomen:  Soft, NTND, BS present  Extremities: 3+ b/l LE edema  Neurologic: Resting quietly, follows commands  Skin: Normal turgor, no rashes  Access: AVG       Basic Metabolic Panel:   Recent Labs Lab 01/07/16 0726 01/12/16 1025  NA  --  136  K  --  5.6*  CL  --  104  CO2  --  26  GLUCOSE  --  93  BUN  --  59*  CREATININE  --  5.10*  CALCIUM  --  8.6*  PHOS 6.1* 5.5*     CBC:  Recent Labs Lab 01/12/16 1025  WBC 5.3  HGB 8.4*  HCT 26.4*  MCV 97.6  PLT 177      Microbiology:  Recent Results (from the past 720 hour(s))  Culture, blood (routine x 2) Call MD if unable to obtain prior to antibiotics being given     Status: None   Collection Time: 01/01/16  6:39 PM  Result Value Ref Range Status   Specimen Description BLOOD BLOOD LEFT FOREARM  Final   Special Requests IN PEDIATRIC BOTTLE 4CC  Final   Culture NO GROWTH 5 DAYS  Final   Report Status 01/06/2016 FINAL  Final  Culture, blood (routine x 2)     Status: None   Collection Time: 01/01/16  6:Jennifer PM   Result Value Ref Range Status   Specimen Description BLOOD BLOOD LEFT HAND  Final   Special Requests IN PEDIATRIC BOTTLE 3CC  Final   Culture NO GROWTH 5 DAYS  Final   Report Status 01/06/2016 FINAL  Final    Coagulation Studies: No results for input(s): LABPROT, INR in the last 72 hours.  Urinalysis: No results for input(s): COLORURINE, LABSPEC, PHURINE, GLUCOSEU, HGBUR, BILIRUBINUR, KETONESUR, PROTEINUR, UROBILINOGEN, NITRITE, LEUKOCYTESUR in the last 72 hours.  Invalid input(s): APPERANCEUR    Imaging: No results found.   Medications:     . cinacalcet  60 mg Oral Q supper  . divalproex  1,000 mg Oral BID  . docusate sodium  100 mg Oral BID  . doxepin  50 mg Oral QHS  . epoetin (EPOGEN/PROCRIT) injection  4,000 Units Intravenous Q T,Th,Sa-HD  . metoprolol tartrate  12.5 mg Oral BID  . mometasone-formoterol  2 puff Inhalation BID  .  multivitamin  1 tablet Oral QHS  . nicotine  21 mg Transdermal Daily  . pantoprazole  40 mg Oral BID  . polyethylene glycol  17 g Oral Daily  . senna-docusate  2 tablet Oral BID  . sevelamer carbonate  1,600 mg Oral TID WC  . simvastatin  10 mg Oral QHS  . ziprasidone  40 mg Oral BID WC   acetaminophen, albuterol, alum & mag hydroxide-simeth, alum & mag hydroxide-simeth, ibuprofen, LORazepam, magnesium hydroxide, MUSCLE RUB, ondansetron  Assessment/ Plan:  55 y.o.AA  Jennifer Lawson with ESRD on HD since 1/16 followed by [facility], COPD, secondary hyperparathyroidism, anemia of CKD, bipolar affective disorder, h./o gastric ulcers caused by NSAIDs  1. ESRD on HD TTS:  Patient completed hemodialysis yesterday. No acute indication for dialysis today. She will resume her normal outpatient schedule tomorrow.  2. Anemia of CKD: Continue erythropoietin stimulating agent therapy as an outpatient.  Most recent hemoglobin was 8.4.   3. Secondary hyperparathyroidism of renal origin:  Phosphorus currently 5.5 and at target.  Continue Sensipar  and Renvela.  4. Hypertension: Continue metoprolol upon discharge.  5. Patient previously requesting Pain meds - already getting ibuprofen - careful administration with food as patient has h/o NSAID induced ulcer EGD 11/30/15 - consider topical creams such as [place]ay to minimize use of oral NSAIDs   LOS: 7 Jennifer Jennifer Lawson 9/6/201711:30 AM

## 2016-01-13 NOTE — Progress Notes (Signed)
D: Pt denies SI/HI/AVH. Pt is pleasant, less irritable, and appropriate in the milieu. Patient is cooperative with treatment plan. Pt stated she feels better from doing dialysis no distress noted. Patient appears less anxious and she is interacting with peers and staff appropriately.  A: Pt was offered support and encouragement. Pt was given scheduled medications. Pt was encouraged to attend groups. Q 15 minute checks were done for safety.  R:Pt attends groups and interacts well with peers and staff. Pt is taking medication. Pt has no complaints.Pt receptive to treatment and safety maintained on unit.

## 2016-01-13 NOTE — Discharge Summary (Signed)
Physician Discharge Summary Note  Patient:  Jennifer Lawson is an 55 y.o., female MRN:  [id number] DOB:  07/02/1960 Patient phone:  [phone] (home)  Patient address:   [address] [place] [address],  Total Time spent with patient: 30 minutes  Date of Admission:  01/06/2016 Date of Discharge: 01/13/2016  Reason for Admission:  Agitation.  Identifying data. 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  Chief complaint. "I am tired."  History of present illness. Information was obtained from the patient and the chart. Jennifer Lawson has a long history of bipolar illness. She was hospitalized several times at [name] for manic, psychotic episodes. Her last hospitalization was in June of 2017 however she returned to the ER on 8/11 with psychotic mania but was discharged to home. She returned to the hospital on 8/25 and was briefly hospitalized for pneumonia. She was discharged on 8/28 and returned on 8/29 petitioned by her family for aggressive behavior towards her daughter and grandson. In spite of hospitalization, the patient apparently missed her dialysis as well.  The family reports insomnia, irritability, agitation, intrusiveness, and psychosis with paranoia and hallucinations. The patient herself denies any symptoms of depression, anxiety, or psychosis. She denies symptoms of bipolar mania. She denies using alcohol or illicit substances. She reports good compliance with medications. This is evidenced by therapeutic VPA level on admission.   Psychiatric history. Several admissions for manic episodes. There is history of suicide attempt by overdose. She usually does well on a combination of Depakote and Geodon but is frequently noncompliant. She follows up with Dr. Rosine Door.  Family psychiatric history. Unknown.  Social history. She is disabled from mental illness. She  lives with her daughter but no longer feels useful as her grandson, whom she cared for, is in school now. She has [employer]. She worries that the family will spend her disability check while she is in the hospital. She would like to return to independent living but it is doubtful she is able to care for herself.  Principal Problem: Bipolar affective disorder, current episode manic with psychotic symptoms [facility]) Discharge Diagnoses: Patient Active Problem List   Diagnosis Date Noted  . HCAP (healthcare-associated pneumonia) [J18.9] 01/01/2016  . Acute blood loss anemia [D62] 11/25/2015  . GI bleed [K92.2] 11/24/2015  . Benign essential HTN [I10] 11/24/2015  . Physical deconditioning [R53.81]   . Asthma [J45.909] 08/06/2015  . Cerebrovascular disease [I67.9] 07/23/2015  . Dyslipidemia [E78.5] 07/14/2015  . Bipolar affective disorder, current episode manic with psychotic symptoms ([place]) [F31.2] 07/13/2015  . Anemia of renal disease [D63.1] 01/26/2015  . CKD (chronic kidney disease) stage V requiring chronic dialysis ([place]) [N18.6, Z99.2] 05/13/2014  . COPD (chronic obstructive pulmonary disease) ([place]) [J44.9] 09/26/2013  . Hyperparathyroidism, primary ([place]) [E21.0] 02/20/2013  . Tobacco use disorder [F17.200] 11/13/2012    Past Medical History:  Past Medical History:  Diagnosis Date  . Anemia of renal disease   . Asthma   . Bipolar 1 disorder ([place])   . Chronic kidney disease    06/11/13- not on dialysis  . Complication of anesthesia    difficulty going to sleep  . COPD (chronic obstructive pulmonary disease) ([place])   . Depression   . Diabetes mellitus without complication ([place])    denies  . GERD (gastroesophageal reflux disease)   . Heart murmur   . History of blood transfusion   . Hypertension   .  Mental disorder   . Overdose   . PTSD (post-traumatic stress disorder)   . Seizures ([place])    "passsed out"  . Shortness of breath    lying down flat  . Tobacco use disorder  11/13/2012    Past Surgical History:  Procedure Laterality Date  . AV FISTULA PLACEMENT Right 06/12/2013   Procedure: ARTERIOVENOUS (AV) FISTULA CREATION; RIGHT  BASILIC VEIN TRANSPOSITION with Intraoperative ultrasound;  Surgeon: Mal [name], MD;  Location: [place];  Service: Vascular;  Laterality: Right;  . COLONOSCOPY N/A 11/30/2015   Procedure: COLONOSCOPY;  Surgeon: Wilford Corner, MD;  Location: [facility] ENDOSCOPY;  Service: Endoscopy;  Laterality: N/A;  . ESOPHAGOGASTRODUODENOSCOPY Left 11/14/2012   Procedure: ESOPHAGOGASTRODUODENOSCOPY (EGD);  Surgeon: [name], MD;  Location: [facility] ENDOSCOPY;  Service: Endoscopy;  Laterality: Left;  . ESOPHAGOGASTRODUODENOSCOPY N/A 11/30/2015   Procedure: ESOPHAGOGASTRODUODENOSCOPY (EGD);  Surgeon: Wilford Corner, MD;  Location: [facility] ENDOSCOPY;  Service: Endoscopy;  Laterality: N/A;  . JOINT REPLACEMENT Right    knee  . PARATHYROIDECTOMY    . Right knee replacement     she says it was last year.   Family History:  Family History  Problem Relation Age of Onset  . Diabetes Mother   . Hyperlipidemia Mother   . Hypertension Mother   . Diabetes Father   . Hypertension Father   . Hyperlipidemia Father     Social History:  History  Alcohol Use  . Yes    Comment: "I like a cold beer every once in a while"      History  Drug Use No    Comment: pt denied any drug use    Social History   Social History  . Marital status: Divorced    Spouse name: N/A  . Number of children: N/A  . Years of education: N/A   Social History Main Topics  . Smoking status: Current Every Day Smoker    Packs/day: 2.00    Years: 40.00    Types: Cigarettes, Cigars  . Smokeless tobacco: Never Used  . Alcohol use Yes     Comment: "I like a cold beer every once in a while"   . Drug use: No     Comment: pt denied any drug use  . Sexual activity: No   Other Topics Concern  . None   Social History Narrative  . 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   1. Bipolar mania. We continued Depakote and Geodon for psychosis and mood stabilization. VPA 70.   2. Kidney disease. Nephrology consult is greatly appreciated. The patient will continue dialysis.  3. COPD. We continued inhalers, Albuterol and Dulera. No respiratory distress  4. Hypertension. She is on metoprolol.   5. GERD. She is on Protonix.  6. Smoking. Nicotine patch is available.  7. Insomnia. We continued Doxepin.    8. Metabolic syndrome. Lipid panel, HgbA1C, TSH and PRL were done during prevoius admission.  9. Dyslipidemia. She is on Zocor.   10. Constipation. She is on Colace.   11. Chronic pain. The patient abuses all types of over-the-counter pain medication.  12. EKG. Normal sinus rhythm. QT 397.  13. Disposition. She will be discharged to home with her daughter. She will follow up with Dr. Rosine Door for medication management and her nephrologist for dialysis.   Physical Findings:  AIMS: Facial and Oral Movements Muscles of Facial Expression: None, normal Lips and Perioral Area: None, normal Jaw: None, normal Tongue: None, normal,Extremity Movements Upper (arms, wrists, hands, fingers): None, normal Lower (legs, knees, ankles, toes): None, normal, Trunk Movements Neck, shoulders, hips: None, normal, Overall Severity Severity of abnormal movements (highest score from questions above): None, normal Incapacitation due to abnormal movements: None, normal Patient's awareness of abnormal movements (rate only patient's report): No Awareness, Dental Status Current problems with teeth and/or dentures?: No Does patient usually wear dentures?: No  CIWA:    COWS:     Musculoskeletal: Strength & Muscle Tone: within normal limits Gait & Station: normal Patient leans:  N/A  Psychiatric Specialty Exam: Physical Exam  Nursing note and vitals reviewed.   Review of Systems  Cardiovascular: Positive for leg swelling.  Musculoskeletal: Positive for joint pain and myalgias.  Psychiatric/Behavioral: The patient is nervous/anxious.   All other systems reviewed and are negative.   Blood pressure (!) 160/90, pulse 89, temperature 98.7 F (37.1 C), resp. rate 18, weight 89 kg (196 lb 3.4 oz), last menstrual period 05/09/2008, SpO2 97 %.Body mass index is 35.89 kg/m.  See SRA.                                                  Sleep:  Number of Hours: 7.15     Have you used any form of tobacco in the last 30 days? (Cigarettes, Smokeless Tobacco, Cigars, and/or Pipes): Patient Refused Screening  Has this patient used any form of tobacco in the last 30 days? (Cigarettes, Smokeless Tobacco, Cigars, and/or Pipes) Yes, Yes, A prescription for an FDA-approved tobacco cessation medication was offered at discharge and the patient refused  Blood Alcohol level:  Lab Results  Component Value Date   [facility] <5 01/04/2016   ETH <5 [id number]    Metabolic Disorder Labs:  Lab Results  Component Value Date   HGBA1C 4.4 (L) 01/02/2016   MPG 80 01/02/2016   MPG 97 05/14/2014   Lab Results  Component Value Date   PROLACTIN 126.4 (H) 06/03/2015   Lab Results  Component Value Date   CHOL 184 06/03/2015   TRIG 102 06/03/2015   HDL 65 06/03/2015   CHOLHDL 2.8 06/03/2015   VLDL 20 06/03/2015   LDLCALC 99 06/03/2015   [place] 99 01/09/2015    See Psychiatric Specialty Exam and Suicide Risk Assessment completed by Attending Physician prior to discharge.  Discharge destination:  Home  Is patient on multiple antipsychotic therapies at discharge:  No   Has Patient had three or more failed trials of antipsychotic monotherapy by history:  No  Recommended Plan for Multiple Antipsychotic Therapies: NA  Discharge Instructions    Diet - low sodium  heart healthy    Complete by:  As directed   Increase activity slowly    Complete by:  As directed       Medication List    STOP taking these medications   B-complex with vitamin C tablet   levofloxacin 250 MG tablet Commonly known as:  LEVAQUIN     TAKE these medications     Indication  albuterol 108 (90 Base) MCG/ACT inhaler Commonly known as:  PROVENTIL HFA;VENTOLIN HFA Inhale 2 puffs into the lungs every 6 (six) hours as needed for wheezing or shortness of breath. What changed:  Another  medication with the same name was removed. Continue taking this medication, and follow the directions you see here.  Indication:  Chronic Obstructive Lung Disease   b complex-vitamin c-folic acid 0.8 MG Tabs tablet Take 1 tablet by mouth at bedtime.  Indication:  general health   cinacalcet 60 MG tablet Commonly known as:  SENSIPAR Take 1 tablet (60 mg total) by mouth daily. Takes with largest meal once daily What changed:  additional instructions  Indication:  Secondary Hyperparathyroidism   Darbepoetin Alfa 40 MCG/0.4ML Sosy injection Commonly known as:  ARANESP Inject 0.4 mLs (40 mcg total) into the vein every Saturday with hemodialysis.  Indication:  Anemia associated with Chronic Kidney Failure   divalproex 500 MG 24 hr tablet Commonly known as:  DEPAKOTE ER Take 2 tablets (1,000 mg total) by mouth 2 (two) times daily.  Indication:  Rapidly Alternating Manic-Depressive Psychosis   doxepin 50 MG capsule Commonly known as:  SINEQUAN Take 1 capsule (50 mg total) by mouth at bedtime. What changed:  when to take this  reasons to take this  Indication:  Atypical Depression   DSS 100 MG Caps Take 100 mg by mouth 2 (two) times daily.  Indication:  Constipation   furosemide 40 MG tablet Commonly known as:  LASIX Take 60 mg by mouth 2 (two) times daily.  Indication:  High Blood Pressure Disorder   hydrOXYzine 25 MG tablet Commonly known as:  ATARAX/VISTARIL Take 1 tablet  (25 mg total) by mouth 2 (two) times daily as needed for anxiety.  Indication:  Anxiety Neurosis   LORazepam 0.5 MG tablet Commonly known as:  ATIVAN Take 1 tablet (0.5 mg total) by mouth 3 (three) times daily.  Indication:  Delirium   Melatonin 5 MG Tabs Take 5 mg by mouth at bedtime.  Indication:  Trouble Sleeping   metoprolol tartrate 25 MG tablet Commonly known as:  LOPRESSOR Take 0.5 tablets (12.5 mg total) by mouth 2 (two) times daily.  Indication:  High Blood Pressure Disorder   mometasone-formoterol 100-5 MCG/ACT Aero Commonly known as:  DULERA Inhale 2 puffs into the lungs 2 (two) times daily.  Indication:  Asthma   nicotine 21 mg/24hr patch Commonly known as:  NICODERM CQ - dosed in mg/24 hours Place 1 patch (21 mg total) onto the skin daily.  Indication:  Nicotine Addiction   ondansetron 4 MG disintegrating tablet Commonly known as:  ZOFRAN-ODT Take 4 mg by mouth every 6 (six) hours as needed for nausea or vomiting.  Indication:  Uremic Pruritus   pantoprazole 40 MG tablet Commonly known as:  PROTONIX Take 1 tablet (40 mg total) by mouth 2 (two) times daily.  Indication:  Gastroesophageal Reflux Disease   polyethylene glycol packet Commonly known as:  MIRALAX / GLYCOLAX Take 17 g by mouth daily.  Indication:  Constipation   sennosides-docusate sodium 8.6-50 MG tablet Commonly known as:  SENOKOT-S Take 2 tablets by mouth 2 (two) times daily.  Indication:  Constipation   sevelamer carbonate 800 MG tablet Commonly known as:  RENVELA Take 2 tablets (1,600 mg total) by mouth 3 (three) times daily with meals. Takes 2 tabs with each meal and snacks as directed What changed:  additional instructions  Indication:  High Amount of Phosphate in the Blood   simvastatin 10 MG tablet Commonly known as:  ZOCOR Take 1 tablet (10 mg total) by mouth at bedtime. Reported on 05/30/2015  Indication:  Inherited Heterozygous Hypercholesterolemia   ziprasidone 60 MG  capsule Commonly known as:  GEODON Take 1 capsule (60 mg total) by mouth 2 (two) times daily with a meal.  Indication:  Manic-Depression        Follow-up recommendations:  Activity:  as tolerated. Diet:  heart healthy. Other:  keep follow up appointment.  Comments:    Signed: Orson Slick, MD 01/13/2016, 9:04 AM

## 2016-07-27 IMAGING — CT CT HEAD W/O CM
5 of 11 series · 16 of 47 positions shown, 17 images · non-contrast
Comparison: CT cervical spine July 29, 2012; head CT July 20, 2015

CLINICAL DATA: Pain following fall. History of chronic renal
failure

EXAM:
CT HEAD WITHOUT CONTRAST
CT CERVICAL SPINE WITHOUT CONTRAST
TECHNIQUE: Multidetector CT imaging of the head and cervical spine was
performed following the standard protocol without intravenous
contrast. Multiplanar CT image reconstructions of the cervical spine
were also generated.

[Series 3: head bone · axial · 0.43mm/px · z∈[+1053,+1149]mm · 4 of 81 slices shown, 5 images]
[im 17/81  brain]
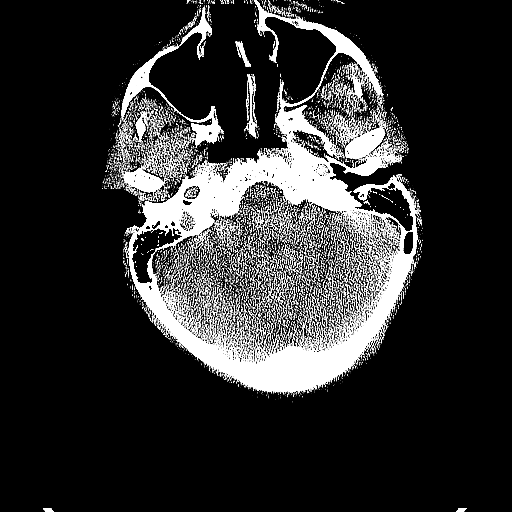
[im 17/81  bone]
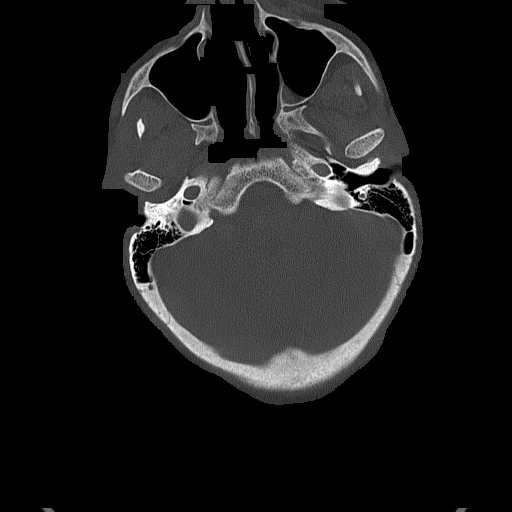
[im 33/81  brain]
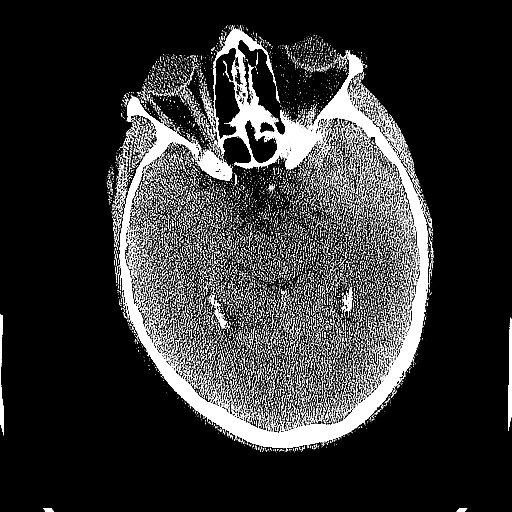
[im 49/81  brain]
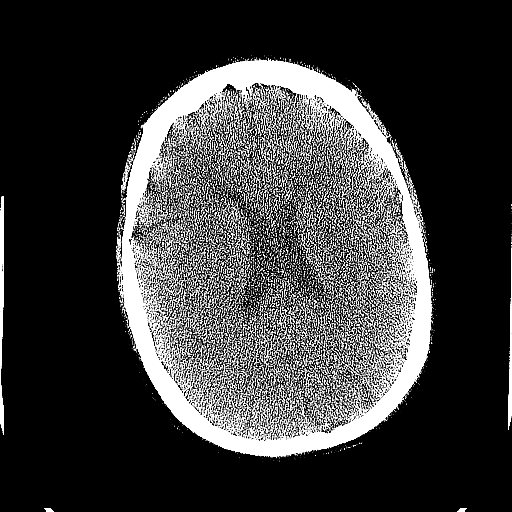
[im 65/81  brain]
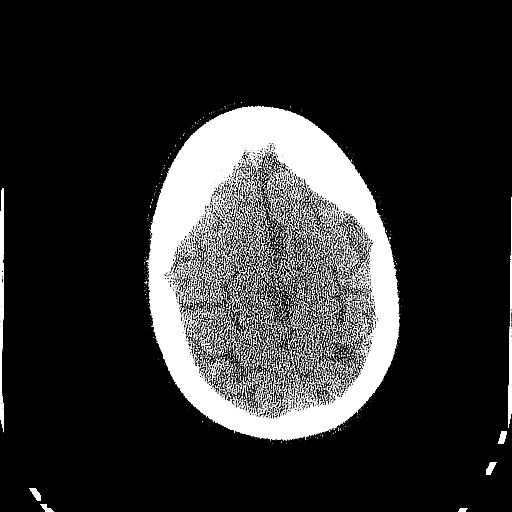

[Series 5: c_spine 2.0 st · axial · 0.38mm/px · z∈[+891,+1019]mm · 5 of 97 slices shown]
[im 17/97  brain]
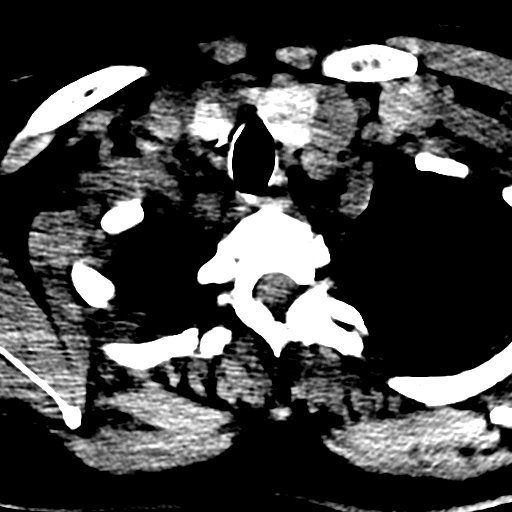
[im 33/97  brain]
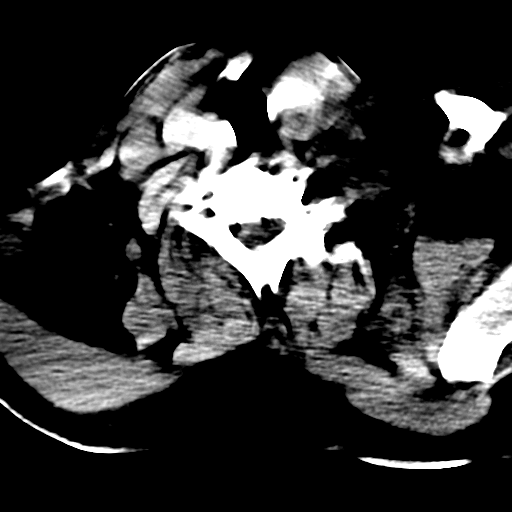
[im 49/97  brain]
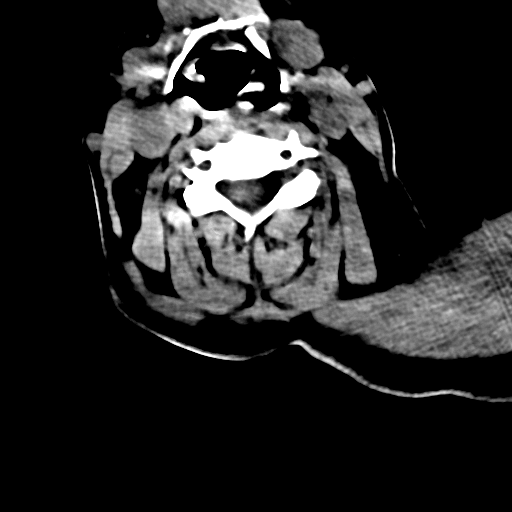
[im 65/97  brain]
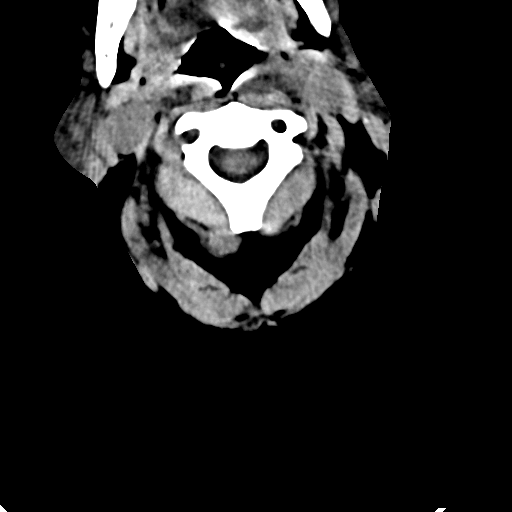
[im 81/97  brain]
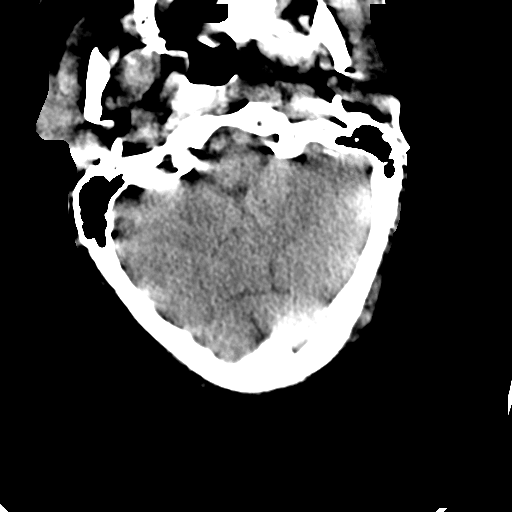

[Series 7: c_spine 2.0 orthogonals · axial · 0.21mm/px · z∈[+876,+893]mm · 2 of 96 slices shown]
[im 16/96  brain]
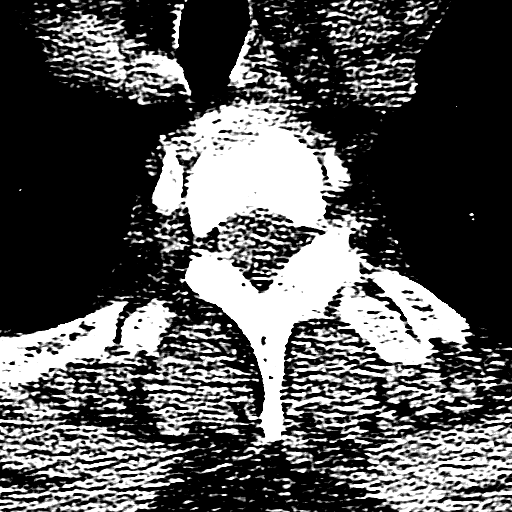
[im 32/96  brain]
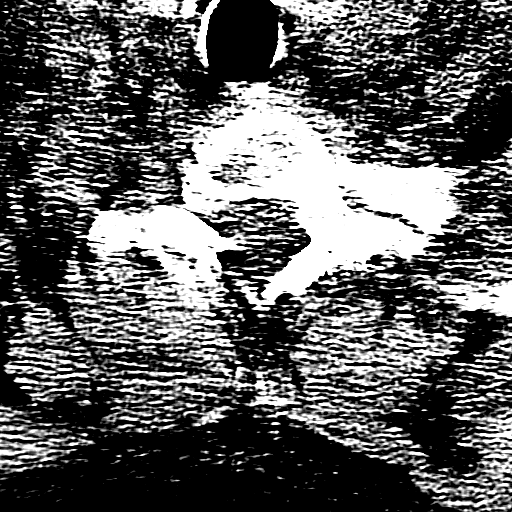

[Series 9: c_spine 2.0 sag bone · sagittal · 0.36mm/px · 2 of 61 slices shown]
[im 21/61  brain]
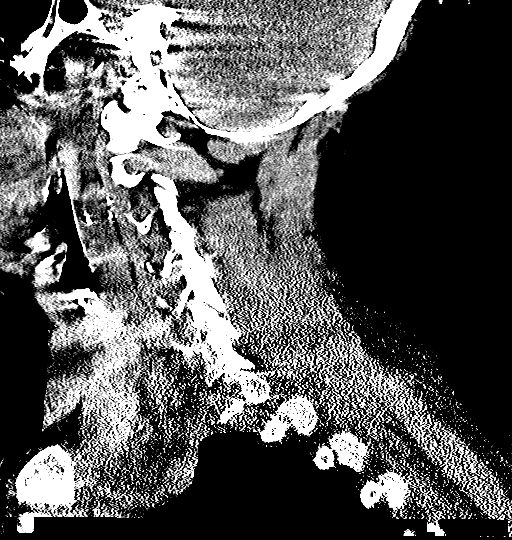
[im 41/61  brain]
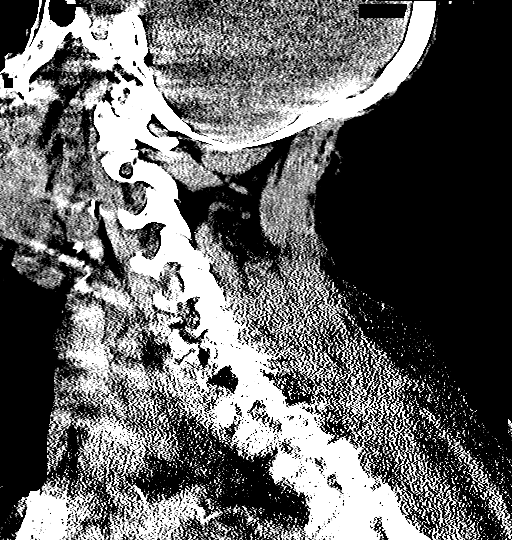

[Series 10: c_spine 2.0 cor bone · coronal · 0.28mm/px · 3 of 60 slices shown]
[im 7/60  brain]
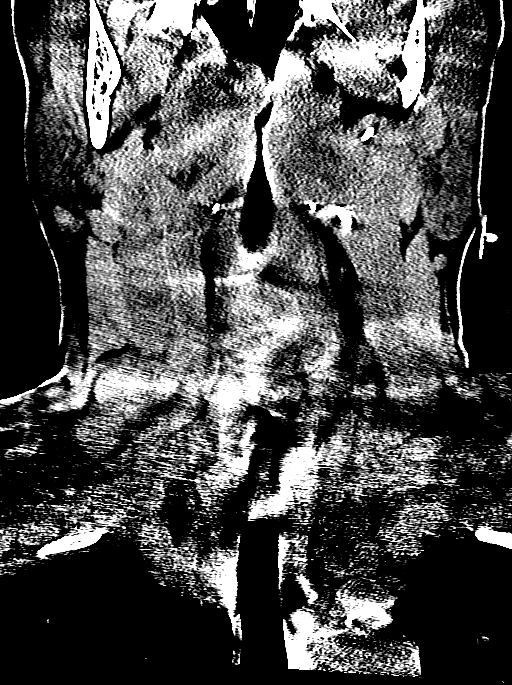
[im 13/60  brain]
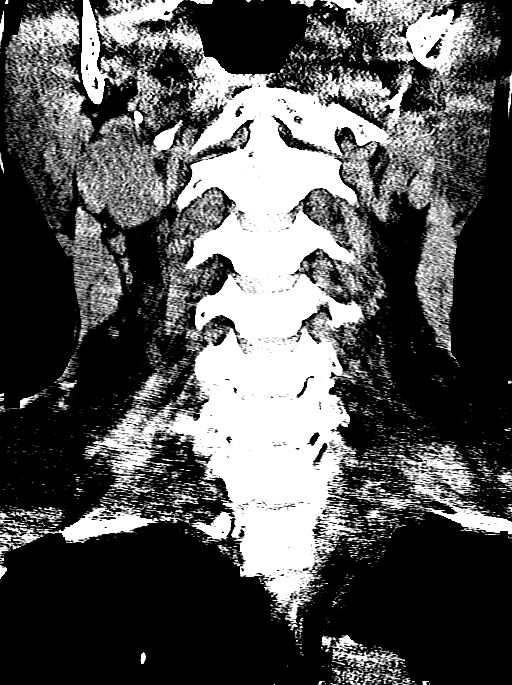
[im 19/60  brain]
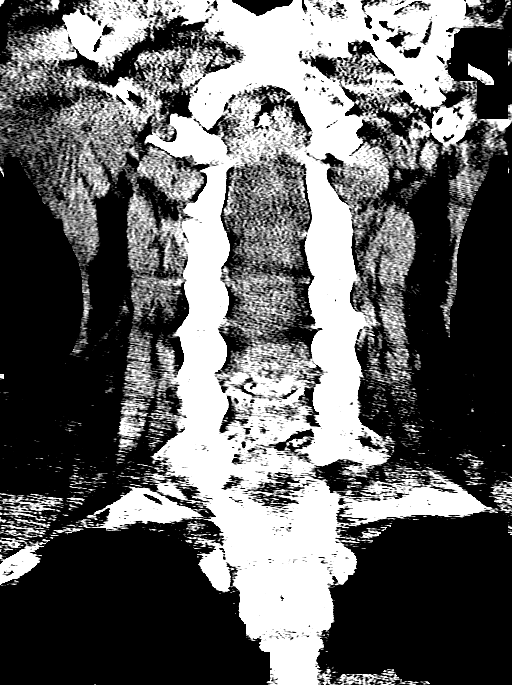

[16 of 47 positions shown; findings below may reference images not displayed]

FINDINGS: CT HEAD FINDINGS

The ventricles are normal in size and configuration. There is no
intracranial mass, hemorrhage, extra-axial fluid collection, or
midline shift. There is stable minimal small vessel disease in the
centra semiovale bilaterally. Elsewhere gray-white compartments
appear normal. No acute infarct evident. The bony calvarium appears
intact. The mastoid air cells are clear. There is a retention cyst
in the inferior posterior left maxillary antrum. No intraorbital
lesions are evident.

CT CERVICAL SPINE FINDINGS

There is no demonstrable fracture or spondylolisthesis. Prevertebral
soft tissues and predental space regions are within normal limits.
There is moderate disc space narrowing at C5-6 and C6-7 with
uncovertebral spurring at C5-6 and C7 bilaterally. There are
anterior and posterior osteophytes at C5 and C6. There is facet
hypertrophy at multiple levels bilaterally. Exit foraminal narrowing
is most notable at C5-6 and C6-7 bilaterally. There is no frank disc
extrusion or high-grade stenosis. There are foci of carotid artery
calcification bilaterally.
IMPRESSION: CT head: Minimal periventricular small vessel disease. No
intracranial mass, hemorrhage, or extra-axial fluid collection. No
acute infarct. Retention cyst inferior left maxillary antrum.

CT cervical spine: No demonstrable fracture or spondylolisthesis.
Osteoarthritic change at several levels, most notably at C5-6 and
C6-7, progressed slightly since the 2425 study. There are foci of
carotid artery calcification bilaterally.

## 2016-08-07 DEATH — deceased
